# Patient Record
Sex: Female | Born: 1965 | Race: Black or African American | Hispanic: No | Marital: Single | Smoking: Former smoker
Health system: Southern US, Community
[De-identification: ages and names within clinical notes are randomized; demographics above are authoritative.]

## PROBLEM LIST (undated history)

## (undated) DIAGNOSIS — R591 Generalized enlarged lymph nodes: Secondary | ICD-10-CM

## (undated) DIAGNOSIS — D539 Nutritional anemia, unspecified: Secondary | ICD-10-CM

## (undated) DIAGNOSIS — B379 Candidiasis, unspecified: Secondary | ICD-10-CM

## (undated) DIAGNOSIS — R0602 Shortness of breath: Secondary | ICD-10-CM

## (undated) DIAGNOSIS — I5032 Chronic diastolic (congestive) heart failure: Secondary | ICD-10-CM

## (undated) DIAGNOSIS — I2510 Atherosclerotic heart disease of native coronary artery without angina pectoris: Secondary | ICD-10-CM

## (undated) DIAGNOSIS — Z720 Tobacco use: Secondary | ICD-10-CM

## (undated) DIAGNOSIS — I10 Essential (primary) hypertension: Secondary | ICD-10-CM

## (undated) DIAGNOSIS — R51 Headache: Secondary | ICD-10-CM

## (undated) DIAGNOSIS — I639 Cerebral infarction, unspecified: Secondary | ICD-10-CM

## (undated) DIAGNOSIS — R05 Cough: Secondary | ICD-10-CM

## (undated) DIAGNOSIS — F329 Major depressive disorder, single episode, unspecified: Secondary | ICD-10-CM

## (undated) DIAGNOSIS — D72819 Decreased white blood cell count, unspecified: Secondary | ICD-10-CM

## (undated) DIAGNOSIS — Z9289 Personal history of other medical treatment: Secondary | ICD-10-CM

## (undated) DIAGNOSIS — I272 Pulmonary hypertension, unspecified: Secondary | ICD-10-CM

## (undated) DIAGNOSIS — D61818 Other pancytopenia: Secondary | ICD-10-CM

## (undated) DIAGNOSIS — A539 Syphilis, unspecified: Secondary | ICD-10-CM

## (undated) DIAGNOSIS — E785 Hyperlipidemia, unspecified: Secondary | ICD-10-CM

## (undated) DIAGNOSIS — D638 Anemia in other chronic diseases classified elsewhere: Secondary | ICD-10-CM

## (undated) DIAGNOSIS — F99 Mental disorder, not otherwise specified: Secondary | ICD-10-CM

## (undated) DIAGNOSIS — E611 Iron deficiency: Secondary | ICD-10-CM

## (undated) DIAGNOSIS — I209 Angina pectoris, unspecified: Secondary | ICD-10-CM

## (undated) DIAGNOSIS — I071 Rheumatic tricuspid insufficiency: Secondary | ICD-10-CM

## (undated) DIAGNOSIS — F419 Anxiety disorder, unspecified: Secondary | ICD-10-CM

## (undated) DIAGNOSIS — K5900 Constipation, unspecified: Secondary | ICD-10-CM

## (undated) DIAGNOSIS — M310 Hypersensitivity angiitis: Secondary | ICD-10-CM

## (undated) DIAGNOSIS — F1910 Other psychoactive substance abuse, uncomplicated: Secondary | ICD-10-CM

## (undated) DIAGNOSIS — D0510 Intraductal carcinoma in situ of unspecified breast: Secondary | ICD-10-CM

## (undated) DIAGNOSIS — F32A Depression, unspecified: Secondary | ICD-10-CM

## (undated) HISTORY — DX: Other psychoactive substance abuse, uncomplicated: F19.10

## (undated) HISTORY — DX: Hypersensitivity angiitis: M31.0

## (undated) HISTORY — DX: Anemia in other chronic diseases classified elsewhere: D63.8

## (undated) HISTORY — DX: Cough: R05

## (undated) HISTORY — PX: ANKLE SURGERY: SHX546

## (undated) HISTORY — DX: Headache: R51

## (undated) HISTORY — DX: Other pancytopenia: D61.818

## (undated) HISTORY — DX: Iron deficiency: E61.1

## (undated) HISTORY — DX: Tobacco use: Z72.0

## (undated) HISTORY — DX: Nutritional anemia, unspecified: D53.9

## (undated) HISTORY — DX: Generalized enlarged lymph nodes: R59.1

## (undated) HISTORY — DX: Cerebral infarction, unspecified: I63.9

## (undated) HISTORY — DX: Decreased white blood cell count, unspecified: D72.819

## (undated) HISTORY — DX: Syphilis, unspecified: A53.9

## (undated) HISTORY — DX: Chronic diastolic (congestive) heart failure: I50.32

## (undated) HISTORY — DX: Shortness of breath: R06.02

## (undated) HISTORY — DX: Rheumatic tricuspid insufficiency: I07.1

## (undated) HISTORY — PX: BONE MARROW ASPIRATION: SHX1252

## (undated) HISTORY — DX: Atherosclerotic heart disease of native coronary artery without angina pectoris: I25.10

## (undated) HISTORY — DX: Candidiasis, unspecified: B37.9

## (undated) HISTORY — DX: Essential (primary) hypertension: I10

## (undated) HISTORY — DX: Personal history of other medical treatment: Z92.89

## (undated) HISTORY — PX: CARDIAC CATHETERIZATION: SHX172

## (undated) HISTORY — DX: Constipation, unspecified: K59.00

---

## 1999-11-20 ENCOUNTER — Emergency Department (HOSPITAL_COMMUNITY): Admission: EM | Admit: 1999-11-20 | Discharge: 1999-11-20 | Payer: Self-pay | Admitting: Emergency Medicine

## 1999-12-02 ENCOUNTER — Emergency Department (HOSPITAL_COMMUNITY): Admission: EM | Admit: 1999-12-02 | Discharge: 1999-12-02 | Payer: Self-pay | Admitting: Emergency Medicine

## 2005-05-13 ENCOUNTER — Ambulatory Visit: Payer: Self-pay | Admitting: *Deleted

## 2005-05-13 ENCOUNTER — Ambulatory Visit: Payer: Self-pay | Admitting: Nurse Practitioner

## 2005-05-15 ENCOUNTER — Ambulatory Visit: Payer: Self-pay | Admitting: Nurse Practitioner

## 2005-05-22 ENCOUNTER — Ambulatory Visit: Payer: Self-pay | Admitting: Nurse Practitioner

## 2005-05-29 ENCOUNTER — Ambulatory Visit: Payer: Self-pay | Admitting: Nurse Practitioner

## 2005-06-01 ENCOUNTER — Ambulatory Visit: Payer: Self-pay | Admitting: Nurse Practitioner

## 2007-10-18 ENCOUNTER — Ambulatory Visit: Payer: Self-pay | Admitting: Internal Medicine

## 2007-10-19 ENCOUNTER — Ambulatory Visit: Payer: Self-pay | Admitting: Surgery

## 2007-10-19 ENCOUNTER — Encounter (INDEPENDENT_AMBULATORY_CARE_PROVIDER_SITE_OTHER): Payer: Self-pay | Admitting: Internal Medicine

## 2007-10-19 ENCOUNTER — Inpatient Hospital Stay (HOSPITAL_COMMUNITY): Admission: EM | Admit: 2007-10-19 | Discharge: 2007-10-21 | Payer: Self-pay | Admitting: Emergency Medicine

## 2007-11-10 ENCOUNTER — Ambulatory Visit: Payer: Self-pay | Admitting: Family Medicine

## 2007-11-24 ENCOUNTER — Ambulatory Visit: Payer: Self-pay | Admitting: Internal Medicine

## 2007-12-21 ENCOUNTER — Ambulatory Visit: Payer: Self-pay | Admitting: Obstetrics & Gynecology

## 2008-01-19 ENCOUNTER — Other Ambulatory Visit: Admission: RE | Admit: 2008-01-19 | Discharge: 2008-01-19 | Payer: Self-pay | Admitting: Obstetrics and Gynecology

## 2008-01-19 ENCOUNTER — Ambulatory Visit: Payer: Self-pay | Admitting: Obstetrics and Gynecology

## 2008-01-19 ENCOUNTER — Encounter: Payer: Self-pay | Admitting: Obstetrics and Gynecology

## 2008-01-19 LAB — CONVERTED CEMR LAB
HCT: 37.4 % (ref 36.0–46.0)
Hemoglobin: 12.2 g/dL (ref 12.0–15.0)
Platelets: 238 10*3/uL (ref 150–400)
RBC: 4.51 M/uL (ref 3.87–5.11)
WBC: 4.6 10*3/uL (ref 4.0–10.5)

## 2008-01-27 ENCOUNTER — Ambulatory Visit: Payer: Self-pay | Admitting: Family Medicine

## 2008-02-02 ENCOUNTER — Ambulatory Visit: Payer: Self-pay | Admitting: Obstetrics & Gynecology

## 2008-02-07 ENCOUNTER — Ambulatory Visit: Payer: Self-pay | Admitting: Obstetrics & Gynecology

## 2008-10-03 ENCOUNTER — Ambulatory Visit: Payer: Self-pay | Admitting: Family Medicine

## 2008-10-23 ENCOUNTER — Ambulatory Visit: Payer: Self-pay | Admitting: Family Medicine

## 2008-10-23 ENCOUNTER — Encounter (INDEPENDENT_AMBULATORY_CARE_PROVIDER_SITE_OTHER): Payer: Self-pay | Admitting: Internal Medicine

## 2008-10-23 LAB — CONVERTED CEMR LAB
Albumin: 4.5 g/dL (ref 3.5–5.2)
Alkaline Phosphatase: 65 units/L (ref 39–117)
BUN: 13 mg/dL (ref 6–23)
Basophils Absolute: 0 10*3/uL (ref 0.0–0.1)
Calcium: 9.7 mg/dL (ref 8.4–10.5)
Eosinophils Relative: 2 % (ref 0–5)
Glucose, Bld: 76 mg/dL (ref 70–99)
HCT: 38.8 % (ref 36.0–46.0)
Hemoglobin: 12.9 g/dL (ref 12.0–15.0)
Lymphocytes Relative: 26 % (ref 12–46)
MCHC: 33.2 g/dL (ref 30.0–36.0)
MCV: 82.4 fL (ref 78.0–100.0)
Monocytes Absolute: 0.9 10*3/uL (ref 0.1–1.0)
Potassium: 3.9 meq/L (ref 3.5–5.3)
RDW: 14.3 % (ref 11.5–15.5)

## 2009-08-06 ENCOUNTER — Ambulatory Visit: Payer: Self-pay | Admitting: Internal Medicine

## 2009-08-06 ENCOUNTER — Encounter (INDEPENDENT_AMBULATORY_CARE_PROVIDER_SITE_OTHER): Payer: Self-pay | Admitting: Adult Health

## 2009-08-06 LAB — CONVERTED CEMR LAB
ALT: 10 units/L (ref 0–35)
CO2: 23 meq/L (ref 19–32)
Calcium: 8.7 mg/dL (ref 8.4–10.5)
Chloride: 103 meq/L (ref 96–112)
Cholesterol: 147 mg/dL (ref 0–200)
Lymphocytes Relative: 26 % (ref 12–46)
Lymphs Abs: 1.2 10*3/uL (ref 0.7–4.0)
MCV: 73.9 fL — ABNORMAL LOW (ref 78.0–100.0)
Microalb, Ur: 14.16 mg/dL — ABNORMAL HIGH (ref 0.00–1.89)
Monocytes Relative: 28 % — ABNORMAL HIGH (ref 3–12)
Neutro Abs: 2.1 10*3/uL (ref 1.7–7.7)
Neutrophils Relative %: 45 % (ref 43–77)
RBC: 3.99 M/uL (ref 3.87–5.11)
Sodium: 135 meq/L (ref 135–145)
TSH: 0.776 microintl units/mL (ref 0.350–4.500)
Total Protein: 7.3 g/dL (ref 6.0–8.3)
VLDL: 16 mg/dL (ref 0–40)
WBC: 4.6 10*3/uL (ref 4.0–10.5)

## 2009-08-07 ENCOUNTER — Ambulatory Visit (HOSPITAL_COMMUNITY): Admission: RE | Admit: 2009-08-07 | Discharge: 2009-08-07 | Payer: Self-pay | Admitting: Internal Medicine

## 2009-10-18 ENCOUNTER — Ambulatory Visit: Payer: Self-pay | Admitting: Family Medicine

## 2009-10-18 LAB — CONVERTED CEMR LAB
Anti Nuclear Antibody(ANA): NEGATIVE
BUN: 13 mg/dL (ref 6–23)
Basophils Relative: 0 % (ref 0–1)
Chloride: 102 meq/L (ref 96–112)
MCHC: 30.3 g/dL (ref 30.0–36.0)
Monocytes Relative: 31 % — ABNORMAL HIGH (ref 3–12)
Neutro Abs: 0.1 10*3/uL — ABNORMAL LOW (ref 1.7–7.7)
Neutrophils Relative %: 6 % — ABNORMAL LOW (ref 43–77)
Platelets: 327 10*3/uL (ref 150–400)
Potassium: 4.2 meq/L (ref 3.5–5.3)
RBC: 4.38 M/uL (ref 3.87–5.11)
Sodium: 136 meq/L (ref 135–145)
WBC: 2.3 10*3/uL — ABNORMAL LOW (ref 4.0–10.5)

## 2009-11-30 ENCOUNTER — Inpatient Hospital Stay (HOSPITAL_COMMUNITY): Admission: EM | Admit: 2009-11-30 | Discharge: 2009-12-05 | Payer: Self-pay | Admitting: Emergency Medicine

## 2009-11-30 ENCOUNTER — Ambulatory Visit: Payer: Self-pay | Admitting: Cardiology

## 2009-12-04 ENCOUNTER — Encounter (INDEPENDENT_AMBULATORY_CARE_PROVIDER_SITE_OTHER): Payer: Self-pay | Admitting: Internal Medicine

## 2010-04-13 ENCOUNTER — Encounter: Payer: Self-pay | Admitting: Internal Medicine

## 2010-04-14 ENCOUNTER — Encounter: Payer: Self-pay | Admitting: Internal Medicine

## 2010-06-05 LAB — CBC
HCT: 25.8 % — ABNORMAL LOW (ref 36.0–46.0)
HCT: 20.4 % — ABNORMAL LOW (ref 36.0–46.0)
HCT: 23.3 % — ABNORMAL LOW (ref 36.0–46.0)
HCT: 24.5 % — ABNORMAL LOW (ref 36.0–46.0)
HCT: 25.4 % — ABNORMAL LOW (ref 36.0–46.0)
Hemoglobin: 8.6 g/dL — ABNORMAL LOW (ref 12.0–15.0)
Hemoglobin: 8.2 g/dL — ABNORMAL LOW (ref 12.0–15.0)
Hemoglobin: 8.4 g/dL — ABNORMAL LOW (ref 12.0–15.0)
MCH: 23.1 pg — ABNORMAL LOW (ref 26.0–34.0)
MCH: 22.1 pg — ABNORMAL LOW (ref 26.0–34.0)
MCH: 22.1 pg — ABNORMAL LOW (ref 26.0–34.0)
MCH: 22.3 pg — ABNORMAL LOW (ref 26.0–34.0)
MCH: 23 pg — ABNORMAL LOW (ref 26.0–34.0)
MCHC: 33.1 g/dL (ref 30.0–36.0)
MCHC: 33.2 g/dL (ref 30.0–36.0)
MCHC: 33.3 g/dL (ref 30.0–36.0)
MCV: 69.7 fL — ABNORMAL LOW (ref 78.0–100.0)
MCV: 66.6 fL — ABNORMAL LOW (ref 78.0–100.0)
MCV: 69.6 fL — ABNORMAL LOW (ref 78.0–100.0)
Platelets: 205 10*3/uL (ref 150–400)
RBC: 3.7 MIL/uL — ABNORMAL LOW (ref 3.87–5.11)
RDW: 21.8 % — ABNORMAL HIGH (ref 11.5–15.5)
RDW: 22.2 % — ABNORMAL HIGH (ref 11.5–15.5)
RDW: 22.7 % — ABNORMAL HIGH (ref 11.5–15.5)
RDW: 23.4 % — ABNORMAL HIGH (ref 11.5–15.5)
WBC: 3 10*3/uL — ABNORMAL LOW (ref 4.0–10.5)

## 2010-06-05 LAB — URINALYSIS, ROUTINE W REFLEX MICROSCOPIC
Glucose, UA: NEGATIVE mg/dL
Nitrite: NEGATIVE
Specific Gravity, Urine: 1.008 (ref 1.005–1.030)
pH: 6.5 (ref 5.0–8.0)

## 2010-06-05 LAB — BASIC METABOLIC PANEL
BUN: 8 mg/dL (ref 6–23)
CO2: 24 mEq/L (ref 19–32)
CO2: 22 mEq/L (ref 19–32)
Chloride: 107 mEq/L (ref 96–112)
Chloride: 109 mEq/L (ref 96–112)
Creatinine, Ser: 0.99 mg/dL (ref 0.4–1.2)
GFR calc Af Amer: >60 mL/min (ref >60)
Glucose, Bld: 112 mg/dL — ABNORMAL HIGH (ref 70–99)
Potassium: 4 mEq/L (ref 3.5–5.1)
Potassium: 4.1 mEq/L (ref 3.5–5.1)
Sodium: 137 mEq/L (ref 135–145)

## 2010-06-05 LAB — COMPREHENSIVE METABOLIC PANEL
ALT: 10 U/L (ref 0–35)
ALT: 9 U/L (ref 0–35)
BUN: 7 mg/dL (ref 6–23)
CO2: 24 mEq/L (ref 19–32)
Calcium: 8.4 mg/dL (ref 8.4–10.5)
Calcium: 8.5 mg/dL (ref 8.4–10.5)
Creatinine, Ser: 0.99 mg/dL (ref 0.4–1.2)
Creatinine, Ser: 1.12 mg/dL (ref 0.4–1.2)
GFR calc Af Amer: >60 mL/min (ref >60)
GFR calc non Af Amer: 52 mL/min — ABNORMAL LOW (ref >60)
Glucose, Bld: 102 mg/dL — ABNORMAL HIGH (ref 70–99)
Glucose, Bld: 88 mg/dL (ref 70–99)
Sodium: 133 mEq/L — ABNORMAL LOW (ref 135–145)
Sodium: 134 mEq/L — ABNORMAL LOW (ref 135–145)
Total Protein: 7.5 g/dL (ref 6.0–8.3)
Total Protein: 8.2 g/dL (ref 6.0–8.3)

## 2010-06-05 LAB — FOLATE: Folate: 8.4 ng/mL

## 2010-06-05 LAB — TROPONIN I: Troponin I: 0.01 ng/mL (ref 0.00–0.06)

## 2010-06-05 LAB — GLUCOSE, CAPILLARY
Glucose-Capillary: 108 mg/dL — ABNORMAL HIGH (ref 70–99)
Glucose-Capillary: 112 mg/dL — ABNORMAL HIGH (ref 70–99)
Glucose-Capillary: 113 mg/dL — ABNORMAL HIGH (ref 70–99)
Glucose-Capillary: 135 mg/dL — ABNORMAL HIGH (ref 70–99)
Glucose-Capillary: 82 mg/dL (ref 70–99)
Glucose-Capillary: 86 mg/dL (ref 70–99)
Glucose-Capillary: 92 mg/dL (ref 70–99)
Glucose-Capillary: 92 mg/dL (ref 70–99)
Glucose-Capillary: 95 mg/dL (ref 70–99)
Glucose-Capillary: 99 mg/dL (ref 70–99)

## 2010-06-05 LAB — CROSSMATCH

## 2010-06-05 LAB — IRON AND TIBC
Saturation Ratios: 7 % — ABNORMAL LOW (ref 20–55)
TIBC: 251 ug/dL (ref 250–470)
UIBC: 233 ug/dL

## 2010-06-05 LAB — LIPASE, BLOOD: Lipase: 20 U/L (ref 11–59)

## 2010-06-05 LAB — CULTURE, BLOOD (ROUTINE X 2)

## 2010-06-05 LAB — LIPID PANEL
HDL: 16 mg/dL — ABNORMAL LOW (ref >39)
Triglycerides: 137 mg/dL (ref <150)
VLDL: 27 mg/dL (ref 0–40)

## 2010-06-05 LAB — SEDIMENTATION RATE: Sed Rate: 80 mm/hr — ABNORMAL HIGH (ref 0–22)

## 2010-06-05 LAB — URINE MICROSCOPIC-ADD ON

## 2010-06-05 LAB — FERRITIN: Ferritin: 187 ng/mL (ref 10–291)

## 2010-06-05 LAB — PROTIME-INR
INR: 1.14 (ref 0.00–1.49)
Prothrombin Time: 14.8 seconds (ref 11.6–15.2)

## 2010-06-05 LAB — DIFFERENTIAL
Basophils Absolute: 0 10*3/uL (ref 0.0–0.1)
Eosinophils Absolute: 0 10*3/uL (ref 0.0–0.7)
Lymphocytes Relative: 32 % (ref 12–46)
Monocytes Relative: 30 % — ABNORMAL HIGH (ref 3–12)
Neutrophils Relative %: 38 % — ABNORMAL LOW (ref 43–77)

## 2010-06-05 LAB — HIV ANTIBODY (ROUTINE TESTING W REFLEX): HIV: NONREACTIVE

## 2010-06-05 LAB — HEPATITIS PANEL, ACUTE
Hep A IgM: NEGATIVE
Hep B C IgM: NEGATIVE
Hepatitis B Surface Ag: NEGATIVE

## 2010-06-05 LAB — ABO/RH: ABO/RH(D): B POS

## 2010-06-05 LAB — RETICULOCYTES: Retic Ct Pct: 1.2 % (ref 0.4–3.1)

## 2010-06-05 LAB — HEMOCCULT GUIAC POC 1CARD (OFFICE): Fecal Occult Bld: NEGATIVE

## 2010-06-05 LAB — MAGNESIUM: Magnesium: 2.1 mg/dL (ref 1.5–2.5)

## 2010-08-05 NOTE — H&P (Signed)
NAMETANEAL, SONNTAG                ACCOUNT NO.:  [id number]   MEDICAL RECORD NO.:  [id number]          PATIENT TYPE:  INP   LOCATION:  3035                         FACILITY:  [facility]   PHYSICIAN:  Lonia Blood, M.D.      DATE OF BIRTH:  [birth year]   DATE OF ADMISSION:  10/18/2007  DATE OF DISCHARGE:                              HISTORY & PHYSICAL   PRIMARY CARE PHYSICIAN:  The patient is unassigned to [place].   PRESENTING COMPLAINT:  Right hand weakness and clumsiness.   HISTORY OF PRESENT ILLNESS:  The patient is a 45 year old female with  severe noncompliance, polysubstance abuse, especially cocaine use.  The  patient reported having had right-sided weakness and slurred speech over  2-3 days prior to coming in.  She had used cocaine extensively the day  before the onset of her symptoms.  Since then, she has been steadily  improving.  She has recovered most of her function, including her gait,  except still having poor writing skills.  Denied any other focal  weakness.  She has been on medications before, but has not taken them  effectively.   PAST MEDICAL HISTORY:  Mainly hypertension.   ALLERGIES:  NO KNOWN DRUG ALLERGIES.   MEDICATIONS:  Does not take any medicine at all.   SOCIAL HISTORY:  The patient lives literally alone.  Very high risk  noncompliant patient.  Smokes about a pack per day.  Heavy drinker, uses  crack cocaine.   FAMILY HISTORY:  Significant for coronary artery disease, diabetes and  hypertension.   REVIEW OF SYSTEMS:  Twelve point review of systems is mainly per HPI.   PHYSICAL EXAMINATION:  VITAL SIGNS:  Temperature 97.8, blood pressure  230/143, pulse of 89, respiratory 18 and sats 100% on room air.  GENERAL:  She is awake, alert, oriented, communicating well in no acute  distress.  HEENT:  PERRL.  EOMI.  NECK:  Supple.  No JVD, no lymphadenopathy.  RESPIRATORY:  Good air entry bilaterally.  No wheezes or rales.  CARDIOVASCULAR:  The patient has S1-S2,  no murmurs.  ABDOMEN:  Soft, nontender with positive bowel sounds.  EXTREMITIES:  No edema, cyanosis or clubbing.  NEURO:  Essentially normal with normal cranial nerves II-XII.  Power 5/5  upper and lower extremities respectively.  Reflexes 2+ upper and lower  extremities respectively.  Good gait.  The patient has mild a problem  with being legible, but is able to write sentences effectively.   LABORATORY DATA:  Sodium 139, potassium 3.8, chloride 111, BUN 13,  creatinine 1.2, glucose 92 and calcium 10.  Cardiac enzymes essentially  normal.  PT 13.9, INR 1.0.  Albumin 3.2.  Urinalysis showed moderate  leukocyte esterase with cloudy urine and large hemoglobin.  WBCs 11-20,  RBCs 11-20 and few bacteria.  Also, the patient had Trichomonas.  Urine  drug screen is positive for cocaine.  Her white count is 4.5, hemoglobin  9.4 and platelet count 250.  Head CT without contrast showed vague  patchy hypodensity in the left frontal paraventricular white matter,  probably due to microvascular ischemic  change.  No hydrocephalus or  midline shift.  MRI of the brain showed acute restricted left pons and  inferior mid brain but no mass effect or bleed seen.  Very advanced  cerebral white matter disease for age.  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   ASSESSMENT:  This is a 45 year old female with use of crack cocaine and  abuse of other substances presenting with subacute stroke,  trichomoniasis, severe hypertension and urinary tract infection.   PLAN:  1. Subacute cerebrovascular accident.  Will admit the patient and do a      full CVA workup, including carotid Dopplers, 2-D echo, checking      B12, RPR.  Homocysteine level.  Although this is quite consistent      with the fact that she uses cocaine and most likely that is the       cause.  Will also get neurology consult and excessive counseling.  2. Hypertension.  Will avoid beta-blockers, but use clonidine and ACE      inhibitors probably to control her blood pressure as much as      possible.  3. Cocaine abuse.  The patient will be counseled.  We may have to get      psych consult prior to her discharge due to the polysubstance      abuse.  4. Trichomoniases.  I will give the patient 2 grams of Flagyl now.      The patient's partner or partners may need to be treated once she      is out of the hospital.  Will need to consider STD screening and      offer it to the patient prior to discharge.  5. UTI.  I will put the patient on 3 days of antibiotics for UTI.      Further treatment will depend on how the patient does, and      hopefully she will be followed with neurology at the end of her      visit.      Lonia Blood, M.D.  Electronically Signed     LG/MEDQ  D:  10/19/2007  T:  10/19/2007  Job:  [id number]

## 2010-08-05 NOTE — Group Therapy Note (Signed)
[name], Bean                ACCOUNT NO.:  [id number]   MEDICAL RECORD NO.:  [id number]         PATIENT TYPE:  AWOC   LOCATION:  [facility]                     FACILITY:   PHYSICIAN:  Johnella Moloney, MD        DATE OF BIRTH:  [birth year]   DATE OF SERVICE:  01/30/2008                                  CLINIC NOTE   Morgan Bean   The patient is a 45 year old gravida 3, para 2-0-1-2 who was evaluated  initially on December 21, 2007, for large uterine fibroids,  menorrhagia, and anemia.  The patient said that she was very interested  in surgical management on a history form that the patient filled out.  She mentioned her only medical problem was hypertension and at that  point, she was getting that controlled on multiple medications.  Upon  further evaluation of her medical history, it is noted that the patient  had a CVA in October 21, 2007, and she was admitted for this.  This was not  mentioned by the patient during her initial encounter and in addition to  this, the patient is having elevated blood pressures in the 170s/120s  range and she says that has been acutely followed by her [facility]  doctor and yet another medication has been added to her regimen to  control her blood pressure.  Given the new information about her CVA and  also labile blood pressures and suboptimal control of her blood  pressure, the patient was told that she is not a good surgical candidate  at this point.  Her surgery was initially scheduled to be on January 31, 2008, and it is now canceled.  Pending further evaluation by her  primary care physician and also medical clearance.  In the meantime, the  patient is going to fill out an application to see if she could get Depo-  Lupron, which will help in shrinking the fibroid and might give some  relief also with her bleeding symptoms.  The patient is agreeable to  this plan.  She will come in with her mother who she currently lives  with regarding  filling out information to be able to complete the  application for the Lupron and get this medication as soon as possible.  The patient did promise that she will call back later today for a time  of appointment when she can come with her mother to give this financial  information.  It was stressed to the patient that is very important with  her encounter and other physicians to make them aware of serious medical  problems such as CVA in the past.  The patient did verbalize  understanding of my concern and also operative concern, given what is  going on with her medical issues.  Her surgery is now cancelled and will  be rescheduled at a later date if needed at which point, she would get  medical clearance before this is done.           ______________________________  Johnella Moloney, MD     UD/MEDQ  D:  01/30/2008  T:  01/31/2008  Job:  [id number]

## 2010-08-05 NOTE — Group Therapy Note (Signed)
NAMEMEILA, BERKE NO.:  [id number]   MEDICAL RECORD NO.:  [id number]          PATIENT TYPE:  WOC   LOCATION:  [facility]                   FACILITY:  [facility]   PHYSICIAN:  Argentina Donovan, MD        DATE OF BIRTH:  06/13/1965   DATE OF SERVICE:  01/19/2008                                  CLINIC NOTE   The patient is a 45 year old gravida 3, para 2-0-1-2, history of two  vaginal deliveries, recently hospitalized with a stroke.  She came in  and saw Dr. Silas Flood on January 10, 2008, because of anemia, heavy periods,  and enlarged fibroid uterus.  At that point, she had a blood pressure of  178/127 that was rechecked and was unchanged, and did not take her blood  pressure medications in 4 days.  Today, she has taken the med regularly  and her blood pressure today is 127/90.  She seems to have been left  with no sequelae from the CVA she had and is anxious to have a  hysterectomy.   PHYSICAL EXAMINATION:  The external genitalia is normal.  BUS within  normal limits.  Vagina is clean and well rugated.  Cervix is clean,  parous and dextroverted to the side, and the uterus is somewhat  irregular in configuration, seems to be about 10-12 weeks size, but very  mobile.  Adnexa could not be well palpated.   Ultrasound showed a uterus that was 10 x 3.5 x 4.5 in July 2009 with the  poster fibroid measuring 9.3 x 7.5 x 6.3.  Pap smear was taken and  endometrial biopsy was done.  The uterus was sounded to a depth of 11  cm.  My feeling is that probably this might be able to be done vaginally  if we can shrink it down with Depo-Lupron, so we are going to apply for  Depo-Lupron scholarship dose of 11.25 mg.  We will have her come back in  2 weeks for her results of her endometrial biopsy and Pap smear, and at  that time make a decision on the surgery with Dr. Silas Flood who has  previously seen her and we also drew a CBC today as the patient is  taking iron.   MEDICATIONS:  Clonidine,  hydrochlorothiazide, multivitamins, ferrous  sulfate, baby aspirin, and ibuprofen p.r.n.           ______________________________  Argentina Donovan, MD     PR/MEDQ  D:  01/19/2008  T:  01/20/2008  Job:  [id number]
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
## 2010-08-05 NOTE — Discharge Summary (Signed)
[name], Morgan Bean                ACCOUNT NO.:  [id number]   MEDICAL RECORD NO.:  [id number]          PATIENT TYPE:  INP   LOCATION:  3035                         FACILITY:  [facility]   PHYSICIAN:  Elliot Cousin, M.D.    DATE OF BIRTH:  1965-12-13   DATE OF ADMISSION:  10/18/2007  DATE OF DISCHARGE:  10/21/2007                               DISCHARGE SUMMARY   DISCHARGE DIAGNOSES:  1. 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  2. Left upper extremity paresis and dysarthria secondary to the acute      stroke.  3. Malignant hypertension.  4. Iron deficiency anemia.  5. Moderate left ventricular hypertrophy per 2-D echocardiogram.  6. Cocaine, alcohol, and tobacco abuse.  7. Urinary tract infection.  8. Syphilis.  9. Trichomonas infection.  10.Large uterine fibroids.  11.Cipro allergy.   DISCHARGE MEDICATIONS:  1. Hydrochlorothiazide 25 mg daily.  2. Potassium chloride 20 mEq daily (take with hydrochlorothiazide).  3. Clonidine 0.2 mg 1 tablet daily.  4. Ferrous sulfate 325 mg b.i.d.  5. Multivitamin once daily.  6. Aspirin 81 mg daily.  7. Penicillin G IM, 2.4 million units x2 more injections over the next      2 weeks at the [facility].   DISCHARGE DISPOSITION:  The patient is being discharged home in improved  and stable condition.  She was advised to follow up at the [facility] in 3-5 days.   CONSULTATIONS:  None.   PROCEDURES PERFORMED:  1. Pelvic and transvaginal ultrasound on October 20, 2007.  The results      revealed large anterior and posterior uterine fibroids.      Nonvisualized ovaries.  2. MRA of the head on October 18, 2007.  The results revealed widespread      intracranial arthrosclerosis is advanced for age as detailed above.      Mild to moderate stenosis of the left MCA origin, right MCA      trifurcation, and left ICA.  No major branch occlusion.  3. MRI of the brain on October 18, 2007.  The results revealed  focus of      restricted diffusion and edema in the left pons and inferior mid      brain without mass effect or hemorrhage.  Superimposed very      advanced cerebral white matter disease for age.  4. Two-D echocardiogram performed on October 19, 2007.  The results      revealed overall left ventricular systolic function was normal.      Ejection fraction estimated to be 65%.  No left ventricular      regional wall motion abnormalities.  Left ventricular wall      thickness was moderately increased.  Moderate tricuspid valvular      regurgitation.  Left atrium was moderately dilated.  Right atrium      was moderately dilated.  5. Carotid Doppler study performed but no results available in the      chart.  6. CT scan of the head without contrast on October 18, 2007.  The results      revealed patchy hypodensity in the left frontal periventricular      white matter.  Negative for hemorrhage, hydrocephalus, or midline      shift.   HISTORY OF PRESENT ILLNESS:  The patient is a 45 year old woman with a  past medical history significant for polysubstance abuse, who presented  to the emergency department on October 18, 2007, with a chief complaint of  right-sided weakness and slurred speech.  When the patient was evaluated  in the emergency department, her blood pressure was noted to be 230/143.  The CT scan of her head revealed a patchy hypodensity in the left  frontal periventricular white matter.  The patient was admitted for  further evaluation and management.   For additional details, please see the dictated history and physical.   HOSPITAL COURSE:  1. ACUTE LEFT BRAIN STROKE, WIDESPREAD CEREBROVASCULAR DISEASE, RIGHT      UPPER EXTREMITY WEAKNESS AND DYSARTHRIA.  The patient was started      on treatment with both  clonidine and aspirin.  For further      evaluation, an MRI of the brain, MRA of the head, carotid Dopplers,      and a 2-D echocardiogram were ordered.  The MRI of the brain       revealed findings suggestive of an acute left brain stroke.  The      MRA of the head revealed diffuse widespread cerebrovascular      disease.  The 2-D echocardiogram revealed LVH and an ejection      fraction of 65%.  The carotid Doppler study was ordered;  however,      the results are currently not available.  The physical and      occupational therapists were consulted. Per their assessments,  the      patient did not require further outpatient therapies.  The      occupational therapist did provide the patient with exercises to      improve the strengthening and functionality of her right hand which      is her dominant hand.  The speech therapist was also consulted.      There was no evidence of dysphagia per her evaluation; however,      there was evidence of dysarthria.  The speech therapist provided      the patient with strategies to improve her phonation.  There was      also no evidence of expressive or receptive aphasia.  Over the      course of the hospitalization, the patient's right hand became less      clumsy, however, she still has significant disability with regards      to the use of her right hand.  She still has dysarthria; however,      it appears to be less than when she initially presented.  The      patient may benefit from short term disability until she fully      recovers from her stroke.  2. HYPERTENSIVE URGENCY/MALIGNANT HYPERTENSION.  The patient was      started on clonidine and subsequently Norvasc and      hydrochlorothiazide.  Over the course of the hospitalization, her      blood pressure normalized.  Upon discharge, the Norvasc was      discontinued and the clonidine and the hydrochlorothiazide were      continued.  3. IRON DEFICIENCY ANEMIA WITH EVIDENCE OF UTERINE FIBROIDS AND  DYSFUNCTIONAL UTERINE BLEEDING.  The patient's hemoglobin was 9.4      and her MCV was 68.9 at the time of the initial hospital      assessment.  With IV fluid volume  repletion, her hemoglobin fell to      a nadir of 8.2.  Iron studies were ordered and revealed a vitamin      B12 of 321, total iron of 18, TIBC of 370, ferritin of 17, and RBC      folate of 811.  Her TSH was also assessed and was within normal      limits at 3.3.  When questioned about rectal bleeding, the patient      had no complaints or history of bright red blood per rectum or      melena.  However, she readily admitted that her menstrual periods      are generally heavy lasting 10-15 days.  Given this revelation, a      pelvic ultrasound was ordered and it revealed large uterine      fibroids.  More than likely, dysfunctional uterine bleeding is the      cause of the patient's microcytic anemia.  The patient was advised      to follow up with her primary care physician Dr. Emeline Darling at the      [facility] who will hopefully refer her to a gynecologist      for further evaluation and management.  The patient was started on      ferrous sulfate and a multivitamin daily.  4. COCAINE, ALCOHOL, AND TOBACCO ABUSE.  The patient's history is      positive for crack cocaine use, tobacco use, and alcohol use.  The      patient was advised and counseled about stopping.  She was also      informed that the cocaine contributed to the malignant hypertension      and her stroke.  She voiced understanding.  The social worker did      provide the patient with services available to help with substance      abuse; however, the patient refused these services.  The dictating      physician strongly advised the patient to seek help at narcotics      anonymous and alcoholic anonymous as needed.  She was receptive;      however, she felt that she could stop using alcohol and drugs on      her own.  She stated that she will probably need more help with      stopping smoking.  The patient was treated with vitamin therapy and      a nicotine patch during the hospitalization.  She demonstrated no       signs or symptoms consistent with a withdrawal syndrome.  5. URINARY TRACT INFECTION, SYPHILIS, TRICHOMONAS INFECTION.  The      patient's urinalysis was positive for moderate leukocytes.  She was      started on ciprofloxacin initially; however, she did develop an      itchy rash following the ciprofloxacin.  The Cipro was discontinued      and the patient was started on Rocephin.  She was treated      completely during the hospitalization and now she has no complaints      of painful urination.  The patient was also screened for syphilis      with an RPR.  The RPR was reactive.  The RPR  titer was 1:4.      Subsequently, T pallidum antibody was ordered and it was grossly      positive at greater than 8.0.  The patient stated that she had been      treated for syphilis in the past.  However, given the elevated      pallidum antibody, she was restarted on penicillin therapy.  She      received one injection during the hospitalization and was advised      to follow up with her primary care physician at the [facility] for two additional doses.  The patient's urinalysis was also      positive for  Trichomonas.  She was treated with 2 g of Flagyl      orally.  The patient was strongly advised to use protection when      having sexual intercourse.  She voiced understanding.  Because of      the positive STD screen, an HIV antibody was ordered.  The HIV      antibody was negative/nonreactive.  6. ALLERGIC REACTION SECONDARY TO CIPRO.  The patient developed an      itchy, transient rash as the ciprofloxacin was being infused via      the IV.  The ciprofloxacin was discontinued and the patient's mild      allergic reaction dissipated.      Elliot Cousin, M.D.  Electronically Signed     DF/MEDQ  D:  10/21/2007  T:  10/22/2007  Job:  [id number]   cc:   [facility]

## 2010-12-09 ENCOUNTER — Other Ambulatory Visit: Payer: Self-pay | Admitting: Family Medicine

## 2010-12-09 ENCOUNTER — Other Ambulatory Visit (HOSPITAL_COMMUNITY): Payer: Self-pay | Admitting: Family Medicine

## 2010-12-09 DIAGNOSIS — Z1231 Encounter for screening mammogram for malignant neoplasm of breast: Secondary | ICD-10-CM

## 2010-12-12 ENCOUNTER — Encounter: Payer: Self-pay | Admitting: Cardiology

## 2010-12-15 ENCOUNTER — Ambulatory Visit (INDEPENDENT_AMBULATORY_CARE_PROVIDER_SITE_OTHER): Payer: Self-pay | Admitting: Cardiology

## 2010-12-15 ENCOUNTER — Encounter: Payer: Self-pay | Admitting: Cardiology

## 2010-12-15 DIAGNOSIS — I10 Essential (primary) hypertension: Secondary | ICD-10-CM | POA: Insufficient documentation

## 2010-12-15 DIAGNOSIS — F17200 Nicotine dependence, unspecified, uncomplicated: Secondary | ICD-10-CM

## 2010-12-15 DIAGNOSIS — Z79899 Other long term (current) drug therapy: Secondary | ICD-10-CM

## 2010-12-15 DIAGNOSIS — R0609 Other forms of dyspnea: Secondary | ICD-10-CM | POA: Insufficient documentation

## 2010-12-15 DIAGNOSIS — E785 Hyperlipidemia, unspecified: Secondary | ICD-10-CM

## 2010-12-15 DIAGNOSIS — R079 Chest pain, unspecified: Secondary | ICD-10-CM | POA: Insufficient documentation

## 2010-12-15 LAB — BASIC METABOLIC PANEL
BUN: 20 mg/dL (ref 6–23)
CO2: 25 mEq/L (ref 19–32)
Chloride: 102 mEq/L (ref 96–112)
Glucose, Bld: 79 mg/dL (ref 70–99)
Potassium: 4 mEq/L (ref 3.5–5.1)

## 2010-12-15 LAB — HEPATIC FUNCTION PANEL
ALT: 10 U/L (ref 0–35)
AST: 15 U/L (ref 0–37)
Albumin: 3.3 g/dL — ABNORMAL LOW (ref 3.5–5.2)
Alkaline Phosphatase: 68 U/L (ref 39–117)

## 2010-12-15 MED ORDER — LISINOPRIL 20 MG PO TABS: 20.0000 mg | ORAL_TABLET | Freq: Every day | ORAL | Status: DC

## 2010-12-15 NOTE — Patient Instructions (Signed)
Your physician recommends that you schedule a follow-up appointment in:  AS NEEDED Your physician recommends that you continue on your current medications as directed. Please refer to the Current Medication list given to you today.  Your physician recommends that you return for lab work in: TODAY BMET BNP  LIPID LIVER   DX  401.9 786.09 V58.69  Your physician has requested that you have an echocardiogram. Echocardiography is a painless test that uses sound waves to create images of your heart. It provides your doctor with information about the size and shape of your heart and how well your heart's chambers and valves are working. This procedure takes approximately one hour. There are no restrictions for this procedure. DX DYSPNEA Your physician has requested that you have en exercise stress myoview. For further information please visit [email or web address] Please follow instruction sheet, as given. DX CHEST PAIN

## 2010-12-15 NOTE — Assessment & Plan Note (Signed)
Active smoker.  She has started Chantix and is trying to quit.

## 2010-12-15 NOTE — Assessment & Plan Note (Signed)
BP is high despite taking medications today.  I will have her increase lisinopril to 20 mg daily.  Will need BMET in 2 wks at return appointment.  When I see her back, I will titrate her meds depending on blood pressure at that time.

## 2010-12-15 NOTE — Assessment & Plan Note (Signed)
Patient has constant low level chest pressure.  It dose get worse with exertion.  She also has quite significant exertional dyspnea.  Risk factors for CAD include premature family history of CAD (brother at 74), smoking, hypertension.  I will get an ETT-myoview for risk stratification.  I will check lipids.  She should continue ASA 81 mg daily.

## 2010-12-15 NOTE — Progress Notes (Signed)
PCP: Georganna Skeans, [facility]  45 yo with history of HTN and abnormal echo in 9/11 with moderate pulmonary hypertension and moderate to possibly severe tricuspid regurgitation presents for cardiology evaluation.  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  She had an echo done at that time with the aforementioned findings.  Patient has had poorly controlled hypertension.  This seems to be in part due to noncompliance, but her BP today is 154/104 and she says that she took all her meds.  She has had constant chest aching for over a year.  This gets worse with exertion.  She was apparently told 3 years ago that she had had an MI, but she does not think she has had a stress test and she has not had a heart catheterization in our system.  She has also had quite significant exertional dyspnea for over a year.  She has a very hard time walking up steps.  She cannot walk more than a block without stopping.  She has occasional episodes that could be consistent with PND.  She continues to smoke but is interested in quitting.    ECG: NSR, nonspecific T wave flattening.   Labs (9/12): HCT 31.5   PMH: 1. Fe-deficiency anemia 2. HTN 3. Asthma 4. Active smoker 5. Prior cocaine 6. 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  ANA positive.  Thought to be secondary to cocaine use.   8. Pulmonary hypertension/tricuspid regurgitation: Echo (9/11) with EF 65%, mild LVH, mild AI, mild MR, moderate to possibly severe TR with PA systolic pressure 52 mmHg.    SH: Active smoker (1 ppd > 20 years), has 1 daughter, unemployed.  Prior cocaine abuse.    FH: Grandmother with "heart problems."  Father with 2 open heart surgeries in his late 70s.  Brother with PCI at age 11.   ROS: All systems reviewed and negative except as per HPI.   Current Outpatient Prescriptions  Medication Sig Dispense Refill    . Albuterol Sulfate (PROVENTIL HFA IN) Inhale into the lungs. 2 puffs every 4 hours as needed       . aspirin 81 MG tablet Take 81 mg by mouth daily.        . calcium carbonate (OS-CAL) 600 MG TABS Take 600 mg by mouth. 1 tab bid      . cloNIDine (CATAPRES) 0.2 MG tablet Take 0.2 mg by mouth 2 (two) times daily.        [name] docusate sodium (COLACE) 100 MG capsule Take 100 mg by mouth 2 (two) times daily.        . hydrochlorothiazide (HYDRODIURIL) 25 MG tablet Take 25 mg by mouth daily.        [name] lisinopril (PRINIVIL,ZESTRIL) 20 MG tablet Take 1 tablet (20 mg total) by mouth daily.  30 tablet  11  . loratadine (CLARITIN) 10 MG tablet Take 10 mg by mouth daily.        . Magnesium Oxide -Mg Supplement 400 MG CAPS Take by mouth. 1 tab daily       . NON FORMULARY ferosul 325 mg  Take 1 tab bid       . varenicline (CHANTIX) 1 MG tablet As directed       . Vitamin D, Ergocalciferol, (DRISDOL) 50000 UNITS CAPS Take 50,000 Units by mouth.          BP 154/104  Pulse 69  Ht 5\' 6"  (1.676 m)  Wt 164 lb (74.39 kg)  BMI 26.47 kg/m2 General: NAD Neck: JVP 7 cm, no thyromegaly or thyroid nodule.  Lungs: Clear to auscultation bilaterally with normal respiratory effort. CV: Nondisplaced PMI.  Heart regular S1/S2, no S3/S4, 1/6 LLSB systolic murmur.  No peripheral edema.  No carotid bruit.  Normal pedal pulses.  Abdomen: Soft, nontender, no hepatosplenomegaly, no distention.  Skin: Intact without lesions or rashes.  Neurologic: Alert and oriented x 3.  Psych: Normal affect. Extremities: No clubbing or cyanosis.  HEENT: Normal.

## 2010-12-15 NOTE — Assessment & Plan Note (Signed)
Patient has quite prominent exertional dyspnea.  She is not particularly volume overloaded on exam.  There was a concern for moderate to possibly severe TR on last echo.  Her TR murmur is not very prominent today.  There was moderate pulmonary hypertension by the last echo as well.  I would certainly be concerned that pulmonary arterial hypertension could be a cause of her symptoms.  However, the pulmonary hypertension could certainly be secondary to poorly controlled blood pressure with diastolic dysfunction and elevated left atrial pressure.   - Check BNP - Echocardiogram to assess diastolic function, pulmonary artery pressure, and tricuspid regurgitation.  - If there is still significant pulmonary hypertension, would consider right heart cath.

## 2010-12-16 LAB — BRAIN NATRIURETIC PEPTIDE: Pro B Natriuretic peptide (BNP): 132 pg/mL — ABNORMAL HIGH (ref 0.0–100.0)

## 2010-12-18 ENCOUNTER — Ambulatory Visit (HOSPITAL_COMMUNITY): Payer: Self-pay

## 2010-12-18 ENCOUNTER — Ambulatory Visit (HOSPITAL_COMMUNITY)
Admission: RE | Admit: 2010-12-18 | Discharge: 2010-12-18 | Disposition: A | Discharge status: Home / Self Care | Payer: Self-pay | Source: Ambulatory Visit | Attending: Family Medicine | Admitting: Family Medicine

## 2010-12-18 DIAGNOSIS — Z1231 Encounter for screening mammogram for malignant neoplasm of breast: Secondary | ICD-10-CM | POA: Insufficient documentation

## 2010-12-19 LAB — BASIC METABOLIC PANEL
CO2: 24
Chloride: 109
GFR calc Af Amer: >60
Glucose, Bld: 89
Potassium: 3.5
Sodium: 137

## 2010-12-19 LAB — RAPID URINE DRUG SCREEN, HOSP PERFORMED
Amphetamines: NOT DETECTED
Benzodiazepines: NOT DETECTED
Cocaine: POSITIVE — AB

## 2010-12-19 LAB — CK TOTAL AND CKMB (NOT AT ARMC)
CK, MB: 1.2
Total CK: 69

## 2010-12-19 LAB — CBC
HCT: 25.4 — ABNORMAL LOW
HCT: 29.8 — ABNORMAL LOW
Hemoglobin: 8.2 — ABNORMAL LOW
MCHC: 31.5
MCHC: 32.3
MCV: 68.5 — ABNORMAL LOW
MCV: 69.9 — ABNORMAL LOW
Platelets: 228
Platelets: 250
RBC: 3.71 — ABNORMAL LOW
RDW: 20.2 — ABNORMAL HIGH
RDW: 20.7 — ABNORMAL HIGH
WBC: 3.8 — ABNORMAL LOW

## 2010-12-19 LAB — COMPREHENSIVE METABOLIC PANEL
ALT: 22
AST: 26
Albumin: 3 — ABNORMAL LOW
BUN: 12
CO2: 22
Calcium: 8.7
Calcium: 8.9
Chloride: 102
Chloride: 112
Creatinine, Ser: 1.03
Creatinine, Ser: 1.24 — ABNORMAL HIGH
GFR calc Af Amer: 57 — ABNORMAL LOW
GFR calc non Af Amer: 58 — ABNORMAL LOW
Sodium: 136
Total Bilirubin: 0.3

## 2010-12-19 LAB — URINALYSIS, ROUTINE W REFLEX MICROSCOPIC
Nitrite: NEGATIVE
Protein, ur: NEGATIVE
Specific Gravity, Urine: 1.008
Urobilinogen, UA: 0.2

## 2010-12-19 LAB — POCT CARDIAC MARKERS
CKMB, poc: 1.2
Myoglobin, poc: 39
Myoglobin, poc: 44.5
Troponin i, poc: <0.05
Troponin i, poc: <0.05

## 2010-12-19 LAB — POCT I-STAT, CHEM 8
HCT: 29 — ABNORMAL LOW
Hemoglobin: 9.9 — ABNORMAL LOW
Potassium: 3.8
Sodium: 139
TCO2: 19

## 2010-12-19 LAB — DIFFERENTIAL
Basophils Absolute: 0.2 — ABNORMAL HIGH
Eosinophils Absolute: 0
Lymphs Abs: 1.8
Monocytes Absolute: 0.8
Monocytes Relative: 18 — ABNORMAL HIGH

## 2010-12-19 LAB — CARDIAC PANEL(CRET KIN+CKTOT+MB+TROPI)
CK, MB: 1
Relative Index: INVALID
Relative Index: INVALID
Total CK: 52
Total CK: 58
Troponin I: 0.01
Troponin I: 0.01

## 2010-12-19 LAB — PROTIME-INR: INR: 1

## 2010-12-19 LAB — B-NATRIURETIC PEPTIDE (CONVERTED LAB): Pro B Natriuretic peptide (BNP): 345 — ABNORMAL HIGH

## 2010-12-19 LAB — URINE MICROSCOPIC-ADD ON

## 2010-12-19 LAB — RPR: RPR Ser Ql: REACTIVE — AB

## 2010-12-19 LAB — VITAMIN B12: Vitamin B-12: 428 (ref 211–911)

## 2010-12-19 LAB — LIPID PANEL
Cholesterol: 144
HDL: 30 — ABNORMAL LOW
Total CHOL/HDL Ratio: 4.8

## 2010-12-19 LAB — HEMOGLOBIN A1C: Hgb A1c MFr Bld: 5.4

## 2010-12-19 LAB — CULTURE, BLOOD (ROUTINE X 2): Culture: NO GROWTH

## 2010-12-19 LAB — POCT PREGNANCY, URINE: Preg Test, Ur: NEGATIVE

## 2010-12-19 LAB — IRON AND TIBC: UIBC: 352

## 2010-12-19 LAB — HOMOCYSTEINE: Homocysteine: 10.7

## 2010-12-19 LAB — CALCIUM: Calcium: 8.5

## 2010-12-24 ENCOUNTER — Other Ambulatory Visit (HOSPITAL_COMMUNITY): Payer: Self-pay

## 2010-12-25 ENCOUNTER — Ambulatory Visit (HOSPITAL_COMMUNITY): Payer: Self-pay | Attending: Cardiology | Admitting: Radiology

## 2010-12-25 ENCOUNTER — Ambulatory Visit (HOSPITAL_BASED_OUTPATIENT_CLINIC_OR_DEPARTMENT_OTHER): Payer: Self-pay

## 2010-12-25 ENCOUNTER — Other Ambulatory Visit (HOSPITAL_COMMUNITY): Payer: Self-pay | Admitting: Cardiology

## 2010-12-25 ENCOUNTER — Other Ambulatory Visit: Payer: Self-pay | Admitting: Cardiology

## 2010-12-25 ENCOUNTER — Other Ambulatory Visit (INDEPENDENT_AMBULATORY_CARE_PROVIDER_SITE_OTHER): Payer: Self-pay | Admitting: *Deleted

## 2010-12-25 VITALS — Ht 66.0 in | Wt 159.0 lb

## 2010-12-25 DIAGNOSIS — E785 Hyperlipidemia, unspecified: Secondary | ICD-10-CM

## 2010-12-25 DIAGNOSIS — R0609 Other forms of dyspnea: Secondary | ICD-10-CM

## 2010-12-25 DIAGNOSIS — I071 Rheumatic tricuspid insufficiency: Secondary | ICD-10-CM

## 2010-12-25 DIAGNOSIS — R079 Chest pain, unspecified: Secondary | ICD-10-CM | POA: Insufficient documentation

## 2010-12-25 DIAGNOSIS — R0989 Other specified symptoms and signs involving the circulatory and respiratory systems: Secondary | ICD-10-CM

## 2010-12-25 DIAGNOSIS — I079 Rheumatic tricuspid valve disease, unspecified: Secondary | ICD-10-CM

## 2010-12-25 DIAGNOSIS — R0789 Other chest pain: Secondary | ICD-10-CM

## 2010-12-25 DIAGNOSIS — I369 Nonrheumatic tricuspid valve disorder, unspecified: Secondary | ICD-10-CM

## 2010-12-25 LAB — LIPID PANEL
Cholesterol: 202 mg/dL — ABNORMAL HIGH (ref 0–200)
Total CHOL/HDL Ratio: 4
Triglycerides: 77 mg/dL (ref 0.0–149.0)
VLDL: 15.4 mg/dL (ref 0.0–40.0)

## 2010-12-25 MED ORDER — TECHNETIUM TC 99M TETROFOSMIN IV KIT
33.0000 | PACK | Freq: Once | INTRAVENOUS | Status: AC | PRN
  Administered 2010-12-25: 33 via INTRAVENOUS

## 2010-12-25 MED ORDER — TECHNETIUM TC 99M TETROFOSMIN IV KIT
11.0000 | PACK | Freq: Once | INTRAVENOUS | Status: AC | PRN
  Administered 2010-12-25: 11 via INTRAVENOUS

## 2010-12-25 MED ORDER — NITROGLYCERIN 0.4 MG SL SUBL
0.4000 mg | SUBLINGUAL_TABLET | Freq: Once | SUBLINGUAL | Status: AC
  Administered 2010-12-25: 0.4 mg via SUBLINGUAL

## 2010-12-25 MED ORDER — REGADENOSON 0.4 MG/5ML IV SOLN
0.4000 mg | Freq: Once | INTRAVENOUS | Status: AC
  Administered 2010-12-25: 0.4 mg via INTRAVENOUS

## 2010-12-25 NOTE — Progress Notes (Signed)
[facility] [address] NUCLEAR MED [address] [place] [address] [phone]  Cardiology Nuclear Med Study  Morgan Bean is a 45 y.o. female [id number] 05/29/1965   Nuclear Med Background Indication for Stress Test:  Evaluation for Ischemia History:  9/11 Echo:EF=65%, moderate pulmonary HTN with moderate to severe TR, mild LVH, mild AI; 12/25/10 Echo:Normal LVF, severe LVH, moderate to secere TR, mild pulmonary HTN now. Cardiac Risk Factors: CVA, Family History - CAD, Hypertension, Lipids and Smoker  Symptoms:  Chest Pain/Pressure with and without Exertion (chest discomfort is "constant", now 2/10), Dizziness, DOE, Fatigue, Palpitations, Rapid HR and SOB   Nuclear Pre-Procedure Caffeine/Decaff Intake:  None NPO After: 6:30pm   Lungs:  Minimal expiratory wheezes.  Proventil inhaler used prior to exercise.  O2 SAT 98% on RA. IV 0.9% NS with Angio Cath:  20g  IV Site: R Antecubital x 1, tolerated well IV Started by:  Irean Hong, RN  Chest Size (in):  34 Cup Size: B  Height: 5\' 6"  (1.676 m)  Weight:  159 lb (72.122 kg)  BMI:  Body mass index is 25.66 kg/(m^2). Tech Comments:  BP 110/88, HR 62, O2 Sat 99% RA, Minimal expiratory wheeze.  Per Toniann Fail, RN  Patient's study was reviewed by  Marca Ancona and  she was discharged once her pain was down to normal levels. Per Lynford Citizen, CNMT    Nuclear Med Study 1 or 2 day study: 1 day  Stress Test Type:  Treadmill/Lexiscan  Reading MD: Olga Millers, MD  Order Authorizing Provider:  Marca Ancona, MD  Resting Radionuclide: Technetium 8m Tetrofosmin  Resting Radionuclide Dose: 11.0 mCi   Stress Radionuclide:  Technetium 54m Tetrofosmin  Stress Radionuclide Dose: 33.0 mCi           Stress Protocol Rest HR: 61 Stress HR: 136  Rest BP: 117/81 Stress BP: 164/91  Exercise Time (min): 6:00 METS: 4.8   Predicted Max HR: 173 bpm % Max HR: 78.61 bpm Rate Pressure Product: 96295   Dose of Adenosine (mg):  n/a  Dose of Lexiscan: 0.4 mg  Dose of Atropine (mg): n/a Dose of Dobutamine: n/a mcg/kg/min (at max HR)  Stress Test Technologist: Smiley Houseman, CMA-N  Nuclear Technologist:  Domenic Polite, CNMT     Rest Procedure:  Myocardial perfusion imaging was performed at rest 45 minutes following the intravenous administration of Technetium 2m Tetrofosmin.  Rest ECG: LVH with strain.  Stress Procedure:  The patient attempted to walk the treadmill utilizing the Bruce protocol, but was unable to reach her target heart rate.  She was then given IV Lexiscan 0.4 mg over 15-seconds with concurrent low level exercise and then Technetium 59m Tetrofosmin was injected at 30-seconds while the patient continued walking one more minute.  There were marked ST-T wave changes with Lexiscan.  She c/o chest tightness 6/10 with exercise and late into recovery.  She was given NTG 0.4 mg SL six minutes into recovery, with complete relief.   Quantitative spect images were obtained after a 45-minute delay.  Images and EKG were discussed with Dr. Shirlee Latch prior to patient being discharged.  Stress ECG: Uninteretable due to baseline LVH.  QPS Raw Data Images:  Acquisition technically good; normal left ventricular size. Stress Images:  Normal homogeneous uptake in all areas of the myocardium. Rest Images:  Normal homogeneous uptake in all areas of the myocardium. Subtraction (SDS):  No evidence of ischemia. Transient Ischemic Dilatation (Normal <1.22):  0.86 Lung/Heart Ratio (Normal <0.45):  0.20  Quantitative Gated Spect Images QGS EDV:  86 ml QGS ESV:  29 ml QGS cine images:  Normal Wall Motion QGS EF: 67%  Impression Exercise Capacity:  Lexiscan with low level exercise. BP Response:  Normal blood pressure response. Clinical Symptoms:  No chest pain. ECG Impression:   EKG uninterpretable due to LVH. Comparison with Prior Nuclear Study: No images to compare  Overall Impression:  Normal stress nuclear study with no  ischemia or infarction and normal LV function.  Olga Millers

## 2010-12-26 NOTE — Progress Notes (Signed)
No evidence for ischemia or infarction on perfusion images though she had ST changes on Lexiscan ECG.  ECG probably false positive, but will need to followup with her in the office to see what her symptoms are doing.  Please inform patient.  Jaesean Litzau [employer]

## 2010-12-29 ENCOUNTER — Telehealth: Payer: Self-pay | Admitting: Cardiology

## 2010-12-29 DIAGNOSIS — I10 Essential (primary) hypertension: Secondary | ICD-10-CM

## 2010-12-29 DIAGNOSIS — E785 Hyperlipidemia, unspecified: Secondary | ICD-10-CM

## 2010-12-29 MED ORDER — PRAVASTATIN SODIUM 40 MG PO TABS: 40.0000 mg | ORAL_TABLET | Freq: Every evening | ORAL | Status: DC

## 2010-12-29 NOTE — Telephone Encounter (Signed)
Notes Recorded by Jacqlyn Krauss, RN on 12/29/2010 at 11:37 AM Pt notified of results. She agreed to start pravastatin 40mg  daily. She will return for fasting lipid/liver profile 02/24/11. Notes Recorded by Jacqlyn Krauss, RN on 12/29/2010 at 11:14 AM LMTCB Notes Recorded by Marca Ancona, MD on 12/26/2010 at 5:02 PM LDL is high. Would be reasonable to take pravastatin 40 mg daily with lipids/LFTs in 2 months

## 2010-12-29 NOTE — Progress Notes (Signed)
LMTCB

## 2010-12-29 NOTE — Progress Notes (Signed)
Discussed with pt. She will keep appt 01/06/11 with Dr Shirlee Latch.

## 2010-12-29 NOTE — Telephone Encounter (Signed)
Pt returning call from Anne. Please call back.  

## 2010-12-29 NOTE — Progress Notes (Signed)
appt 01/06/11 with Dr Shirlee Latch

## 2011-01-06 ENCOUNTER — Ambulatory Visit (INDEPENDENT_AMBULATORY_CARE_PROVIDER_SITE_OTHER): Payer: Self-pay | Admitting: Cardiology

## 2011-01-06 ENCOUNTER — Encounter: Payer: Self-pay | Admitting: Cardiology

## 2011-01-06 ENCOUNTER — Encounter: Payer: Self-pay | Admitting: *Deleted

## 2011-01-06 DIAGNOSIS — R0989 Other specified symptoms and signs involving the circulatory and respiratory systems: Secondary | ICD-10-CM

## 2011-01-06 DIAGNOSIS — IMO0001 Reserved for inherently not codable concepts without codable children: Secondary | ICD-10-CM

## 2011-01-06 DIAGNOSIS — I079 Rheumatic tricuspid valve disease, unspecified: Secondary | ICD-10-CM

## 2011-01-06 DIAGNOSIS — R0609 Other forms of dyspnea: Secondary | ICD-10-CM

## 2011-01-06 DIAGNOSIS — I10 Essential (primary) hypertension: Secondary | ICD-10-CM

## 2011-01-06 DIAGNOSIS — R079 Chest pain, unspecified: Secondary | ICD-10-CM

## 2011-01-06 DIAGNOSIS — E785 Hyperlipidemia, unspecified: Secondary | ICD-10-CM

## 2011-01-06 DIAGNOSIS — F17200 Nicotine dependence, unspecified, uncomplicated: Secondary | ICD-10-CM

## 2011-01-06 NOTE — Assessment & Plan Note (Signed)
NYHA class III exertional symptoms.  Mild BNP increase.  Severe LV hypertrophy on echo with normal EF, so LV diastolic dysfunction could certainly be playing a role.  Also, patient had moderate to severe tricuspid regurgitation with at least mild pulmonary hypertension.  I am unsure if the TR is primary or is related to more significant pulmonary hypertension.  I am going to do a right heart cath along with the left heart cath.  I will have her stop HCTZ and start Lasix 20 mg daily with KCl 10 mEq daily to see if this helps her dyspnea. BMET prior to cath.

## 2011-01-06 NOTE — Patient Instructions (Signed)
Stop HCTZ (hydrochlorothiazide).  Start Lasix (furosemide) 20mg  daily.  Start KCL (potassium) 10 mEq daily.  Your physician recommends that you return for lab work the first of next week--BMP/CBC/PT 786.09  786.50  397.0  Your physician has requested that you have a cardiac catheterization. Cardiac catheterization is used to diagnose and/or treat various heart conditions. Doctors may recommend this procedure for a number of different reasons. The most common reason is to evaluate chest pain. Chest pain can be a symptom of coronary artery disease (CAD), and cardiac catheterization can show whether plaque is narrowing or blocking your heart's arteries. This procedure is also used to evaluate the valves, as well as measure the blood flow and oxygen levels in different parts of your heart. For further information please visit [email or web address] Please follow instruction sheet, as given.  Your physician recommends that you schedule a follow-up appointment 2 weeks after the cath with Dr Shirlee Latch.

## 2011-01-06 NOTE — Assessment & Plan Note (Signed)
BP is under much better control currently.  It has been poorly controlled in the past (this is corroborated by significant LV hypertrophy).

## 2011-01-06 NOTE — Assessment & Plan Note (Signed)
Patient continues to have episodes of chest pain/tightness and diaphoresis with exertion.  Lexiscan myoview showed significant ST depression with Lexiscan injection but no ischemia or infarction on perfusion images.  Given significant and concerning symptoms and not completely normal myoview, I have set her up for a left heart cath.  Continue ASA 81 mg daily.

## 2011-01-06 NOTE — Assessment & Plan Note (Signed)
LDL high, pravastatin started.  LDL goal will depend on presence or absence of CAD.  Will repeat lipids in 12/12.

## 2011-01-06 NOTE — Progress Notes (Signed)
PCP: Georganna Skeans, [facility]  45 yo with history of HTN and abnormal echo in 9/11 with moderate pulmonary hypertension and moderate to possibly severe tricuspid regurgitation returns for cardiology evaluation.  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  She had an echo done at that time with the aforementioned findings.  Patient has had poorly controlled hypertension.  She has had constant chest aching for over a year.  This gets worse with exertion.  She noted severe chest pain and diaphoresis over the weekend when she was trying to wash clothes.  Any moderate to heavy exertion will lead to central chest tightness.  She was apparently told 3 years ago that she had had an MI, but she does not think she has had a stress test and she has not had a heart catheterization in our system.  She has also had quite significant exertional dyspnea for over a year.  She has marked dyspnea walking up steps.  She cannot walk more than a block without stopping.  She has occasional episodes that could be consistent with PND.  She continues to smoke but is interested in quitting.    After last appointment, I had her do a [employer]  This showed ischemic-type ST depression with infusion of Lexiscan but no ischemia or infarction on perfusion images.  Repeat echo showed EF 60-65% with severe LV hypertrophy, mild MR and AI, moderate to severe tricuspid regurgitation, PA systolic pressure 38 mmHg.  BP is now better with increased lisinopril.  It has been running in the 110s-120s systolic at home.   Pravastatin was started with increased LDL.   Labs (9/12): HCT 31.5, BNP 132, K 4, creatinine 1.2 Labs (10/12): LDL 142, HDL 46  PMH: 1. Fe-deficiency anemia 2. HTN 3. Asthma 4. Active smoker 5. Prior cocaine 6. 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  ANA positive.  Thought to be secondary to  cocaine use.   8. Pulmonary hypertension/tricuspid regurgitation: Echo (9/11) with EF 65%, mild LVH, mild AI, mild MR, moderate to possibly severe TR with PA systolic pressure 52 mmHg.  Echo (10/12): severe LV hypertrophy, EF 60-65%, mild MR, mild AI, moderate to severe tricuspid regurgitation, PA systolic pressure 38 mmHg.   9. Chest pain: Lexiscan myoview (10/12) with significant ST depression upon Lexiscan injection but no ischemia or infarction on perfusion images.   SH: Active smoker (1 ppd > 20 years), has 1 daughter, unemployed.  Prior cocaine abuse.    FH: Grandmother with "heart problems."  Father with 2 open heart surgeries in his late 54s.  Brother with PCI at age 61.   ROS: All systems reviewed and negative except as per HPI.   Current Outpatient Prescriptions  Medication Sig Dispense Refill  . Albuterol Sulfate (PROVENTIL HFA IN) Inhale into the lungs. 2 puffs every 4 hours as needed       . aspirin 81 MG tablet Take 81 mg by mouth daily.        . calcium carbonate (OS-CAL) 600 MG TABS Take 600 mg by mouth. 1 tab bid      . cloNIDine (CATAPRES) 0.2 MG tablet Take 0.2 mg by mouth 2 (two) times daily.        [name] docusate sodium (COLACE) 100 MG capsule Take 100 mg by mouth 2 (two) times daily.        [name] lisinopril (PRINIVIL,ZESTRIL) 20 MG tablet Take  1 tablet (20 mg total) by mouth daily.  30 tablet  11  . loratadine (CLARITIN) 10 MG tablet Take 10 mg by mouth daily.        . Magnesium Oxide -Mg Supplement 400 MG CAPS Take by mouth. 1 tab daily       . NON FORMULARY ferosul 325 mg  Take 1 tab bid       . pravastatin (PRAVACHOL) 40 MG tablet Take 1 tablet (40 mg total) by mouth every evening.  30 tablet  3  . varenicline (CHANTIX) 1 MG tablet As directed       . Vitamin D, Ergocalciferol, (DRISDOL) 50000 UNITS CAPS Take 50,000 Units by mouth.        . furosemide (LASIX) 20 MG tablet Take 1 tablet (20 mg total) by mouth daily.      . potassium chloride (KLOR-CON 10) 10 MEQ CR tablet  Take 1 tablet (10 mEq total) by mouth daily.        BP 116/80  Pulse 68  Ht 5\' 6"  (1.676 m)  Wt 170 lb 6.4 oz (77.293 kg)  BMI 27.50 kg/m2  LMP 11/22/2010 General: NAD Neck: JVP 7-8 cm, no thyromegaly or thyroid nodule.  Lungs: Clear to auscultation bilaterally with normal respiratory effort. CV: Nondisplaced PMI.  Heart regular S1/S2, no S3/S4, 2/6 LLSB systolic murmur.  No peripheral edema.  No carotid bruit.  Normal pedal pulses.  Abdomen: Soft, nontender, no hepatosplenomegaly, no distention.  Skin: Intact without lesions or rashes.  Neurologic: Alert and oriented x 3.  Psych: Normal affect. Extremities: No clubbing or cyanosis.  HEENT: Normal.

## 2011-01-06 NOTE — Assessment & Plan Note (Signed)
She has been taking Chantix and is trying to quit.

## 2011-01-15 ENCOUNTER — Other Ambulatory Visit (INDEPENDENT_AMBULATORY_CARE_PROVIDER_SITE_OTHER): Payer: Self-pay | Admitting: *Deleted

## 2011-01-15 DIAGNOSIS — R0609 Other forms of dyspnea: Secondary | ICD-10-CM

## 2011-01-15 DIAGNOSIS — I079 Rheumatic tricuspid valve disease, unspecified: Secondary | ICD-10-CM

## 2011-01-15 DIAGNOSIS — R0989 Other specified symptoms and signs involving the circulatory and respiratory systems: Secondary | ICD-10-CM

## 2011-01-15 DIAGNOSIS — R079 Chest pain, unspecified: Secondary | ICD-10-CM

## 2011-01-15 LAB — CBC WITH DIFFERENTIAL/PLATELET
Basophils Absolute: 0 10*3/uL (ref 0.0–0.1)
Eosinophils Absolute: 0.4 10*3/uL (ref 0.0–0.7)
HCT: 30.6 % — ABNORMAL LOW (ref 36.0–46.0)
Lymphs Abs: 1.9 10*3/uL (ref 0.7–4.0)
MCHC: 33.3 g/dL (ref 30.0–36.0)
MCV: 76.3 fl — ABNORMAL LOW (ref 78.0–100.0)
Monocytes Absolute: 0.3 10*3/uL (ref 0.1–1.0)
Platelets: 222 10*3/uL (ref 150.0–400.0)
RDW: 22.7 % — ABNORMAL HIGH (ref 11.5–14.6)

## 2011-01-15 LAB — BASIC METABOLIC PANEL
Calcium: 8.9 mg/dL (ref 8.4–10.5)
GFR: 55.34 mL/min — ABNORMAL LOW (ref >60.00)
Glucose, Bld: 92 mg/dL (ref 70–99)
Sodium: 138 mEq/L (ref 135–145)

## 2011-01-16 ENCOUNTER — Inpatient Hospital Stay (HOSPITAL_BASED_OUTPATIENT_CLINIC_OR_DEPARTMENT_OTHER)
Admission: RE | Admit: 2011-01-16 | Discharge: 2011-01-16 | Disposition: A | Discharge status: Home / Self Care | Payer: Self-pay | Source: Ambulatory Visit | Attending: Cardiology | Admitting: Cardiology

## 2011-01-16 DIAGNOSIS — R079 Chest pain, unspecified: Secondary | ICD-10-CM | POA: Insufficient documentation

## 2011-01-16 DIAGNOSIS — I079 Rheumatic tricuspid valve disease, unspecified: Secondary | ICD-10-CM | POA: Insufficient documentation

## 2011-01-16 DIAGNOSIS — R0609 Other forms of dyspnea: Secondary | ICD-10-CM | POA: Insufficient documentation

## 2011-01-16 DIAGNOSIS — I2510 Atherosclerotic heart disease of native coronary artery without angina pectoris: Secondary | ICD-10-CM | POA: Insufficient documentation

## 2011-01-16 DIAGNOSIS — I2789 Other specified pulmonary heart diseases: Secondary | ICD-10-CM | POA: Insufficient documentation

## 2011-01-16 DIAGNOSIS — R0989 Other specified symptoms and signs involving the circulatory and respiratory systems: Secondary | ICD-10-CM | POA: Insufficient documentation

## 2011-01-16 LAB — POCT I-STAT 3, VENOUS BLOOD GAS (G3P V)
O2 Saturation: 73 %
pCO2, Ven: 34.6 mmHg — ABNORMAL LOW (ref 45.0–50.0)
pO2, Ven: 39 mmHg (ref 30.0–45.0)

## 2011-01-16 LAB — POCT I-STAT 3, ART BLOOD GAS (G3+)
Acid-base deficit: 4 mmol/L — ABNORMAL HIGH (ref 0.0–2.0)
pCO2 arterial: 34.5 mmHg — ABNORMAL LOW (ref 35.0–45.0)
pO2, Arterial: 91 mmHg (ref 80.0–100.0)

## 2011-01-24 NOTE — Cardiovascular Report (Signed)
[name], Morgan Bean NO.:  [id number]  MEDICAL RECORD NO.:  [id number]  LOCATION:                                 FACILITY:  PHYSICIAN:  Marca Ancona, MD      DATE OF BIRTH:  Apr 18, 1965  DATE OF PROCEDURE:  01/16/2011 DATE OF DISCHARGE:                           CARDIAC CATHETERIZATION   PROCEDURES: 1. Left heart catheterization. 2. Right heart catheterization. 3. Coronary angiography.  INDICATION:  This is a 45 year old who has had significant exertional dyspnea and chest pain for a number of months.  Echo showed evidence for pulmonary hypertension and severe tricuspid regurgitation.  The Myoview showed an ischemic EKG response, but normal perfusion images.  It was therefore decided to take her for left and right heart catheterization to assess her filling pressures and also to assess for coronary artery disease.  PROCEDURE NOTE:  After informed consent was obtained, the right groin was sterilely prepped and draped.  Lidocaine 1% was used to locally anesthetize right groin area.  The right common femoral artery was entered using modified Seldinger technique and a 4-French arterial sheath was placed.  The right common femoral vein was accessed using modified Seldinger technique.  A 7-French venous sheath was placed.  The left heart catheterization was done first.  The right coronary artery was engaged with the 3-DRC catheter.  The left coronary artery was engaged using the JL-4 catheter.  The left ventricle was entered using angled pigtail catheter.  The right heart catheterization was done next using a balloon tip Swan-Ganz catheter.  Samples were removed for oxygen saturation from the femoral artery as well as from the pulmonary artery.  FINDINGS: 1. Hemodynamics:  Aorta 144/80, left ventricle 130/17, mean right     atrial pressure 4 mmHg, RV 30/6, PA 28/9, mean pulmonary capillary     wedge pressure 9 mmHg.  Aortic saturation 97%, PA saturation  73%.     Cardiac output 7.0 L/min.  Cardiac index 3.8. 2. Left ventriculography:  Left ventriculography was not done due to     mild chronic kidney disease and normal EF on recent echo. 3. Right coronary artery:  The right coronary artery had mild luminal     irregularities.  It was a dominant vessel. 4. Left main:  The left main had no angiographic disease. 5. Left circumflex system:  The left circumflex was diffusely rather     small.  There was no angiographic disease. 6. LAD system:  The LAD was also diffusely rather small.  There was a     moderate-sized first diagonal with about 30% ostial stenosis, small     second diagonal with about 50% ostial stenosis and  there was about     a 30% mid LAD stenosis.  With 200 mcg of intracoronary nitroglycerin,     the LAD and circumflex did increase in caliber, though not     markedly.  IMPRESSION:  The patient has nonobstructive mild coronary artery disease and she has normal filling pressures.  Of note, the patient's initial blood pressure was about 180/100.  She was treated with IV hydralazine with a fall in her blood pressure.  Her pressures  at right heart catheterization were therefore taken with a normalized systemic blood pressure.  Although her right and left heart filling pressures were normal on this catheterization, it is quite possible that her shortness of breath has in the past been in the setting of significant diastolic dysfunction with high blood pressure.  This could potentially also explain her high pulmonary artery pressures noted on echocardiogram.  At this point, it will be most imperative to maintain good blood pressure control.  The patient's blood pressure was actually good when I saw her in the clinic and her pressures have been good when she measures it at home.     Marca Ancona, MD     DM/MEDQ  D:  01/16/2011  T:  01/16/2011  Job:  [id number]  cc:   Georganna Skeans, MD  Electronically Signed by Marca Ancona MD on 01/24/2011 11:37:25 PM

## 2011-02-04 ENCOUNTER — Ambulatory Visit (INDEPENDENT_AMBULATORY_CARE_PROVIDER_SITE_OTHER): Payer: Self-pay | Admitting: Cardiology

## 2011-02-04 ENCOUNTER — Encounter: Payer: Self-pay | Admitting: Cardiology

## 2011-02-04 DIAGNOSIS — I10 Essential (primary) hypertension: Secondary | ICD-10-CM

## 2011-02-04 DIAGNOSIS — R0609 Other forms of dyspnea: Secondary | ICD-10-CM

## 2011-02-04 DIAGNOSIS — R079 Chest pain, unspecified: Secondary | ICD-10-CM

## 2011-02-04 DIAGNOSIS — I509 Heart failure, unspecified: Secondary | ICD-10-CM

## 2011-02-04 DIAGNOSIS — E785 Hyperlipidemia, unspecified: Secondary | ICD-10-CM

## 2011-02-04 DIAGNOSIS — I5032 Chronic diastolic (congestive) heart failure: Secondary | ICD-10-CM

## 2011-02-04 MED ORDER — AMLODIPINE BESYLATE 5 MG PO TABS: 5.0000 mg | ORAL_TABLET | Freq: Every day | ORAL | Status: DC

## 2011-02-04 NOTE — Patient Instructions (Signed)
Start amlodipine 5mg  daily.  Your physician recommends that you schedule a follow-up appointment in: 1 month with Brynda Rim   Your physician recommends that you schedule a follow-up appointment in: 4 months with Dr Shirlee Latch.

## 2011-02-05 NOTE — Assessment & Plan Note (Signed)
Mild coronary disease on left heart cath, nothing to explain chest pain.  Would continue ASA 81 mg daily.

## 2011-02-05 NOTE — Progress Notes (Signed)
PCP: Georganna Skeans, [facility]  45 yo with history of HTN and abnormal echo in 9/11 with moderate pulmonary hypertension and moderate to possibly severe tricuspid regurgitation returns for cardiology evaluation.  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  She had an echo done at that time with the aforementioned findings.  Patient has had poorly controlled hypertension.  Patient had reported episodes of exertional chest pain.  She also had had quite significant exertional dyspnea for over a year.     I had her do a [employer], physiological.  This showed ischemic-type ST depression with infusion of Lexiscan but no ischemia or infarction on perfusion images.  Repeat echo showed EF 60-65% with severe LV hypertrophy, mild MR and AI, moderate to severe tricuspid regurgitation, PA systolic pressure 38 mmHg.    Patient was started on Lasix and left and right heart cath was done.  There was minimal CAD, and filling pressures were normal.  Since starting on Lasix, Morgan Bean's dyspnea seems to have totally resolved.  She jogs in place for 20 minutes at a time for exercise and does not get short of breath with this activity.  She has essentially quit smoking (very rare cigarette). BP remains elevated.    Labs (9/12): HCT 31.5, BNP 132, K 4, creatinine 1.2 Labs (10/12): LDL 142, HDL 46, K 4.2, creatinine 1.3  PMH: 1. Fe-deficiency anemia 2. HTN 3. Asthma 4. Active smoker 5. Prior cocaine 6. 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  ANA positive.  Thought to be secondary to cocaine use.   8. Pulmonary hypertension/tricuspid regurgitation: Echo (9/11) with EF 65%, mild LVH, mild AI, mild MR, moderate to possibly severe TR with PA systolic pressure 52 mmHg.  Echo (10/12): severe LV hypertrophy, EF 60-65%, mild MR, mild AI, moderate to severe tricuspid regurgitation, PA systolic pressure 38 mmHg.     9. Chest pain: Lexiscan myoview (10/12) with significant ST depression upon Lexiscan injection but no ischemia or infarction on perfusion images.  Left heart cath (10/12): 30% mid LAD, 30% ostial D1, 50% ostial D2.  10. Diastolic CHF  SH: Active smoker (1 ppd > 20 years), has 1 daughter, unemployed.  Prior cocaine abuse.    FH: Grandmother with "heart problems."  Father with 2 open heart surgeries in his late 28s.  Brother with PCI at age 27.   ROS: All systems reviewed and negative except as per HPI.   Current Outpatient Prescriptions  Medication Sig Dispense Refill  . Albuterol Sulfate (PROVENTIL HFA IN) Inhale into the lungs. 2 puffs every 4 hours as needed       . aspirin 81 MG tablet Take 81 mg by mouth daily.        . cloNIDine (CATAPRES) 0.2 MG tablet Take 0.2 mg by mouth 2 (two) times daily.        [name] docusate sodium (COLACE) 100 MG capsule Take 100 mg by mouth 2 (two) times daily.        . furosemide (LASIX) 20 MG tablet Take 1 tablet (20 mg total) by mouth daily.      [name] lisinopril (PRINIVIL,ZESTRIL) 20 MG tablet Take 1 tablet (20 mg total) by mouth daily.  30 tablet  11  . loratadine (CLARITIN) 10 MG tablet Take 10 mg by mouth daily.        . Magnesium Oxide -Mg Supplement 400 MG CAPS Take by mouth. 1  tab daily       . NON FORMULARY ferosul 325 mg  Take 1 tab bid       . potassium chloride (KLOR-CON 10) 10 MEQ CR tablet Take 1 tablet (10 mEq total) by mouth daily.      . pravastatin (PRAVACHOL) 40 MG tablet Take 1 tablet (40 mg total) by mouth every evening.  30 tablet  3  . amLODipine (NORVASC) 5 MG tablet Take 1 tablet (5 mg total) by mouth daily.  30 tablet  6  . Vitamin D, Ergocalciferol, (DRISDOL) 50000 UNITS CAPS Take 50,000 Units by mouth.          BP 152/92  Pulse 68  Ht 5\' 6"  (1.676 m)  Wt 78.019 kg (172 lb)  BMI 27.76 kg/m2 General: NAD Neck: JVP 7 cm, no thyromegaly or thyroid nodule.  Lungs: Clear to auscultation bilaterally with normal respiratory effort. CV:  Nondisplaced PMI.  Heart regular S1/S2, no S3/S4, 1/6 LLSB systolic murmur.  No peripheral edema.  No carotid bruit.  Normal pedal pulses.  Abdomen: Soft, nontender, no hepatosplenomegaly, no distention.  Neurologic: Alert and oriented x 3.  Psych: Normal affect. Extremities: No clubbing or cyanosis.

## 2011-02-05 NOTE — Assessment & Plan Note (Signed)
Would aim for LDL < 100.  Check lipids in 12/12.

## 2011-02-05 NOTE — Assessment & Plan Note (Signed)
BP is still running high, but improved.  I will add amlodipine 5 mg daily to her regimen.  She will followup in 1 month with the PA for medication titration and 4 months with me.

## 2011-02-05 NOTE — Assessment & Plan Note (Signed)
This has resolved with BP control and Lasix.  I suspect that the dyspnea was due to diastolic CHF (poor blood pressure control, LVH).  Right heart cath showed well-controlled right and left heart filling pressures on Lasix.

## 2011-02-24 ENCOUNTER — Ambulatory Visit (INDEPENDENT_AMBULATORY_CARE_PROVIDER_SITE_OTHER): Payer: Self-pay | Admitting: Physician Assistant

## 2011-02-24 ENCOUNTER — Encounter: Payer: Self-pay | Admitting: Physician Assistant

## 2011-02-24 ENCOUNTER — Other Ambulatory Visit (INDEPENDENT_AMBULATORY_CARE_PROVIDER_SITE_OTHER): Payer: Self-pay | Admitting: *Deleted

## 2011-02-24 VITALS — BP 140/98 | HR 75 | Ht 66.0 in | Wt 168.8 lb

## 2011-02-24 DIAGNOSIS — F17200 Nicotine dependence, unspecified, uncomplicated: Secondary | ICD-10-CM

## 2011-02-24 DIAGNOSIS — I10 Essential (primary) hypertension: Secondary | ICD-10-CM

## 2011-02-24 DIAGNOSIS — I5033 Acute on chronic diastolic (congestive) heart failure: Secondary | ICD-10-CM | POA: Insufficient documentation

## 2011-02-24 DIAGNOSIS — E785 Hyperlipidemia, unspecified: Secondary | ICD-10-CM

## 2011-02-24 DIAGNOSIS — I509 Heart failure, unspecified: Secondary | ICD-10-CM

## 2011-02-24 DIAGNOSIS — I2510 Atherosclerotic heart disease of native coronary artery without angina pectoris: Secondary | ICD-10-CM

## 2011-02-24 DIAGNOSIS — I5032 Chronic diastolic (congestive) heart failure: Secondary | ICD-10-CM | POA: Insufficient documentation

## 2011-02-24 LAB — LIPID PANEL
HDL: 47.3 mg/dL (ref >39.00)
Total CHOL/HDL Ratio: 3

## 2011-02-24 LAB — HEPATIC FUNCTION PANEL
ALT: 27 U/L (ref 0–35)
AST: 27 U/L (ref 0–37)
Bilirubin, Direct: 0.1 mg/dL (ref 0.0–0.3)
Total Bilirubin: 0.2 mg/dL — ABNORMAL LOW (ref 0.3–1.2)

## 2011-02-24 MED ORDER — CLONIDINE HCL 0.3 MG PO TABS: 0.3000 mg | ORAL_TABLET | Freq: Two times a day (BID) | ORAL | Status: DC

## 2011-02-24 NOTE — Assessment & Plan Note (Signed)
No angina.  Continue ASA and statin. 

## 2011-02-24 NOTE — Patient Instructions (Signed)
Your physician recommends that you schedule a follow-up appointment in: 04/13/11 @ 9:30 to see Tereso Newcomer, PA-C  Your physician has recommended you make the following change in your medication: INCREASE CLONIDINE TO 0.3 MG TWICE DAILY, A NEW PRESCRIPTION HAS BEEN GIVEN TO YOU TODAY

## 2011-02-24 NOTE — Assessment & Plan Note (Signed)
BPs at home still elevated.  She has not noticed that much difference with the start of Norvasc.  Will try to adjust clonidine to 0.3 mg BID.  Follow up with me in 4-6 weeks.  If BP still up, consider increasing Lisinopril or Norvasc at that time.

## 2011-02-24 NOTE — Progress Notes (Signed)
[address] [place], [place]  [address] Phone: [phone] Fax:  [phone]  Date:  02/24/2011   Name:  Morgan Bean       DOB:  [birth year] MRN:  [id number]  PCP: Katrina Stack Primary Cardiologist:  Dr. Marca Ancona    History of Present Illness: Morgan Bean is a 45 y.o. female with history of HTN and abnormal echo in 9/11 with moderate pulmonary hypertension and moderate to possibly severe tricuspid regurgitation for follow up on BP. 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 She had an echo done at that time with the aforementioned findings. Patient has had poorly controlled hypertension. Patient had reported episodes of exertional chest pain. She also had had quite significant exertional dyspnea for over a year.  A Lexiscan myoview showed ischemic-type ST depression with infusion of Lexiscan but no ischemia or infarction on perfusion images. Repeat echo showed EF 60-65% with severe LV hypertrophy, mild MR and AI, moderate to severe tricuspid regurgitation, PA systolic pressure 38 mmHg.  She was started on Lasix and left and right heart cath was done. There was minimal CAD, and filling pressures were normal. After starting Lasix, her dyspnea seemed to have totally resolved.  It was thought that her dyspnea was related to diastolic dysfunction in the setting of uncontrolled HTN.  When last seen by Dr. Marca Ancona, was started on amlodipine for elevated BP and asked to return today for follow up.    Labs (9/12): HCT 31.5, BNP 132, K 4, creatinine 1.2  Labs (10/12): LDL 142, HDL 46, K 4.2, creatinine 1.3   The patient denies chest pain, shortness of breath, syncope, orthopnea, PND or significant pedal edema.  Still jogging in place for 20 mins 3 times a day without chest pain or dyspnea.    Past Medical History:  1. Fe-deficiency anemia  2. HTN  3. Asthma  4. Active smoker  5. Prior  cocaine  6. 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 ANA positive. Thought to be secondary to cocaine use.  8. Pulmonary hypertension/tricuspid regurgitation: Echo (9/11) with EF 65%, mild LVH, mild AI, mild MR, moderate to possibly severe TR with PA systolic pressure 52 mmHg. Echo (10/12): severe LV hypertrophy, EF 60-65%, mild MR, mild AI, moderate to severe tricuspid regurgitation, PA systolic pressure 38 mmHg.  9. Chest pain: Lexiscan myoview (10/12) with significant ST depression upon Lexiscan injection but no ischemia or infarction on perfusion images. Left heart cath (10/12): 30% mid LAD, 30% ostial D1, 50% ostial D2.  10. Diastolic CHF   Current Outpatient Prescriptions  Medication Sig Dispense Refill  . Albuterol Sulfate (PROVENTIL HFA IN) Inhale into the lungs. 2 puffs every 4 hours as needed       . amLODipine (NORVASC) 5 MG tablet Take 1 tablet (5 mg total) by mouth daily.  30 tablet  6  . aspirin 81 MG tablet Take 81 mg by mouth daily.        [name] CALCIUM PO Take by mouth daily.        . cloNIDine (CATAPRES) 0.2 MG tablet Take 0.2 mg by mouth 2 (two) times daily.        [name] docusate sodium (COLACE) 100 MG capsule Take 100 mg by mouth 2 (two) times daily.        . furosemide (LASIX) 20 MG tablet Take 1 tablet (20  mg total) by mouth daily.      [name] lisinopril (PRINIVIL,ZESTRIL) 20 MG tablet Take 1 tablet (20 mg total) by mouth daily.  30 tablet  11  . loratadine (CLARITIN) 10 MG tablet Take 10 mg by mouth daily.        . Magnesium Oxide -Mg Supplement 400 MG CAPS Take by mouth. 1 tab daily       . NON FORMULARY ferosul 325 mg  Take 1 tab bid       . potassium chloride (KLOR-CON 10) 10 MEQ CR tablet Take 1 tablet (10 mEq total) by mouth daily.      . pravastatin (PRAVACHOL) 40 MG tablet Take 1 tablet (40 mg total) by mouth every evening.  30 tablet  3    Allergies: Allergies  Allergen Reactions  . Ciprofloxacin      History  Substance Use Topics  . Smoking status: Current Some Day Smoker  . Smokeless tobacco: Not on file  . Alcohol Use: Yes     ROS:  Please see the history of present illness.   Neurological ROS: negative for - headaches or visual changes    PHYSICAL EXAM: Vital Signs:  BP 140/98  Pulse 75  Ht 5\' 6"  (1.676 m)  Wt 168 lb 12.8 oz (76.567 kg)  BMI 27.24 kg/m2 Repeat BP by me 140/100 bilaterally Well nourished, well developed, in no acute distress HEENT: normal Neck: no JVD Cardiac:  normal S1, S2; RRR; no murmur Lungs:  clear to auscultation bilaterally, no wheezing, rhonchi or rales Abd: soft, nontender, no hepatomegaly Ext: no edema Skin: warm and dry Neuro:  CNs 2-12 intact, no focal abnormalities noted  EKG:   NSR, HR 75, normal axis, NSSTTW changes, no significant change from prior   ASSESSMENT AND PLAN:

## 2011-02-24 NOTE — Assessment & Plan Note (Signed)
She has been set up for FLP and LFTs today.

## 2011-02-24 NOTE — Assessment & Plan Note (Signed)
Volume stable.  No change in Lasix.  Keep follow up with Dr. Marca Ancona in 05/2011.

## 2011-02-24 NOTE — Assessment & Plan Note (Signed)
She is trying to quit. 

## 2011-03-09 ENCOUNTER — Ambulatory Visit: Payer: Self-pay | Admitting: Internal Medicine

## 2011-04-13 ENCOUNTER — Ambulatory Visit: Payer: Self-pay | Admitting: Physician Assistant

## 2011-04-14 ENCOUNTER — Ambulatory Visit (INDEPENDENT_AMBULATORY_CARE_PROVIDER_SITE_OTHER): Payer: Self-pay | Admitting: Physician Assistant

## 2011-04-14 ENCOUNTER — Encounter: Payer: Self-pay | Admitting: Physician Assistant

## 2011-04-14 DIAGNOSIS — I10 Essential (primary) hypertension: Secondary | ICD-10-CM

## 2011-04-14 DIAGNOSIS — I509 Heart failure, unspecified: Secondary | ICD-10-CM

## 2011-04-14 DIAGNOSIS — I5032 Chronic diastolic (congestive) heart failure: Secondary | ICD-10-CM

## 2011-04-14 NOTE — Assessment & Plan Note (Signed)
Blood pressure much better controlled.  No changes in therapy.  Keep followup with Dr. Marca Ancona as scheduled.

## 2011-04-14 NOTE — Patient Instructions (Signed)
Your physician recommends that you schedule a follow-up appointment in: FOLLOW UP AS ALREADY SCHEDULED  NO CHANGES TODAY

## 2011-04-14 NOTE — Assessment & Plan Note (Signed)
Volume appears stable.  Continue current therapy. 

## 2011-04-14 NOTE — Progress Notes (Signed)
[address] [place], [place]  [address] Phone: [phone] Fax:  [phone]  Date:  04/14/2011   Name:  Morgan Bean       DOB:  1965/04/26 MRN:  [id number]  PCP: Katrina Stack Primary Cardiologist:  Dr. Marca Ancona    History of Present Illness: Morgan Bean is a 46 y.o. female with history of HTN and abnormal echo in 9/11 with moderate pulmonary hypertension and moderate to possibly severe tricuspid regurgitation for follow up on BP. 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 She had an echo done at that time with the aforementioned findings. Patient has had poorly controlled hypertension. Patient had reported episodes of exertional chest pain. She also had had quite significant exertional dyspnea for over a year.  A Lexiscan myoview showed ischemic-type ST depression with infusion of Lexiscan but no ischemia or infarction on perfusion images. Repeat echo showed EF 60-65% with severe LV hypertrophy, mild MR and AI, moderate to severe tricuspid regurgitation, PA systolic pressure 38 mmHg.  She was started on Lasix and left and right heart cath was done. There was minimal CAD, and filling pressures were normal. After starting Lasix, her dyspnea seemed to have totally resolved.  It was thought that her dyspnea was related to diastolic dysfunction in the setting of uncontrolled HTN.    Labs (9/12): HCT 31.5, BNP 132, K 4, creatinine 1.2  Labs (10/12): LDL 142, HDL 46, K 4.2, creatinine 1.3   I saw her 12/4 for follow up on blood pressure.  Her blood pressure remained elevated and I changed her clonidine to 0.3 mg twice a day.  She returns for followup.  She is doing well.  She has occasional chest pain.  This is much improved since starting Lasix.  She's working out on a treadmill at home for 15 minutes a day without significant shortness of breath.  She has been awakening in the middle of the  night recently but this is related to her asthma.  She denies edema.  Past Medical History:  1. Fe-deficiency anemia  2. HTN  3. Asthma  4. Active smoker  5. Prior cocaine  6. 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 ANA positive. Thought to be secondary to cocaine use.  8. Pulmonary hypertension/tricuspid regurgitation: Echo (9/11) with EF 65%, mild LVH, mild AI, mild MR, moderate to possibly severe TR with PA systolic pressure 52 mmHg. Echo (10/12): severe LV hypertrophy, EF 60-65%, mild MR, mild AI, moderate to severe tricuspid regurgitation, PA systolic pressure 38 mmHg.  9. Chest pain: Lexiscan myoview (10/12) with significant ST depression upon Lexiscan injection but no ischemia or infarction on perfusion images. Left heart cath (10/12): 30% mid LAD, 30% ostial D1, 50% ostial D2.  10. Diastolic CHF   Current Outpatient Prescriptions  Medication Sig Dispense Refill  . Albuterol Sulfate (PROVENTIL HFA IN) Inhale into the lungs. 2 puffs every 4 hours as needed       . amLODipine (NORVASC) 5 MG tablet Take 1 tablet (5 mg total) by mouth daily.  30 tablet  6  . aspirin 81 MG tablet Take 81 mg by mouth daily.        . cloNIDine (CATAPRES) 0.3 MG tablet Take 1 tablet (0.3 mg total) by mouth 2 (two) times daily.  60 tablet  11  . docusate sodium (COLACE) 100 MG capsule Take 100  mg by mouth 2 (two) times daily.        . furosemide (LASIX) 20 MG tablet Take 1 tablet (20 mg total) by mouth daily.      [name] lisinopril (PRINIVIL,ZESTRIL) 20 MG tablet Take 1 tablet (20 mg total) by mouth daily.  30 tablet  11  . loratadine (CLARITIN) 10 MG tablet Take 10 mg by mouth daily.        . Magnesium Oxide -Mg Supplement 400 MG CAPS Take by mouth. 1 tab daily       . NON FORMULARY ferosul 325 mg  Take 1 tab bid       . potassium chloride (KLOR-CON 10) 10 MEQ CR tablet Take 1 tablet (10 mEq total) by mouth daily.      . pravastatin  (PRAVACHOL) 40 MG tablet Take 1 tablet (40 mg total) by mouth every evening.  30 tablet  3  . CALCIUM PO Take by mouth daily.          Allergies: Allergies  Allergen Reactions  . Ciprofloxacin     History  Substance Use Topics  . Smoking status: Current Some Day Smoker  . Smokeless tobacco: Not on file  . Alcohol Use: Yes     PHYSICAL EXAM: Vital Signs:  BP 118/76  Resp 18  Ht 5\' 6"  (1.676 m)  Wt 172 lb (78.019 kg)  BMI 27.76 kg/m2  Well nourished, well developed, in no acute distress HEENT: normal Neck: no JVD Cardiac:  normal S1, S2; RRR; no murmur Lungs:  clear to auscultation bilaterally, no wheezing, rhonchi or rales Abd: soft, nontender, no hepatomegaly Ext: no edema Skin: warm and dry Neuro:  CNs 2-12 intact, no focal abnormalities noted  EKG:   Sinus rhythm, heart rate 62, normal axis, nonspecific ST-T wave changes  ASSESSMENT AND PLAN:

## 2011-06-04 ENCOUNTER — Ambulatory Visit: Payer: Self-pay | Admitting: Cardiology

## 2011-08-08 ENCOUNTER — Encounter (HOSPITAL_COMMUNITY): Payer: Self-pay | Admitting: Emergency Medicine

## 2011-08-08 ENCOUNTER — Inpatient Hospital Stay (HOSPITAL_COMMUNITY)
Admission: EM | Admit: 2011-08-08 | Discharge: 2011-08-11 | DRG: 571 | Disposition: A | Discharge status: Home / Self Care | Payer: MEDICAID | Attending: Family Medicine | Admitting: Family Medicine

## 2011-08-08 DIAGNOSIS — I079 Rheumatic tricuspid valve disease, unspecified: Secondary | ICD-10-CM

## 2011-08-08 DIAGNOSIS — L0390 Cellulitis, unspecified: Secondary | ICD-10-CM | POA: Diagnosis present

## 2011-08-08 DIAGNOSIS — IMO0002 Reserved for concepts with insufficient information to code with codable children: Principal | ICD-10-CM | POA: Diagnosis present

## 2011-08-08 DIAGNOSIS — Z79899 Other long term (current) drug therapy: Secondary | ICD-10-CM

## 2011-08-08 DIAGNOSIS — E785 Hyperlipidemia, unspecified: Secondary | ICD-10-CM

## 2011-08-08 DIAGNOSIS — L0291 Cutaneous abscess, unspecified: Secondary | ICD-10-CM

## 2011-08-08 DIAGNOSIS — I509 Heart failure, unspecified: Secondary | ICD-10-CM | POA: Diagnosis present

## 2011-08-08 DIAGNOSIS — I5032 Chronic diastolic (congestive) heart failure: Secondary | ICD-10-CM

## 2011-08-08 DIAGNOSIS — R0609 Other forms of dyspnea: Secondary | ICD-10-CM

## 2011-08-08 DIAGNOSIS — Z7982 Long term (current) use of aspirin: Secondary | ICD-10-CM

## 2011-08-08 DIAGNOSIS — I5033 Acute on chronic diastolic (congestive) heart failure: Secondary | ICD-10-CM | POA: Diagnosis present

## 2011-08-08 DIAGNOSIS — Z23 Encounter for immunization: Secondary | ICD-10-CM

## 2011-08-08 DIAGNOSIS — F1010 Alcohol abuse, uncomplicated: Secondary | ICD-10-CM | POA: Diagnosis present

## 2011-08-08 DIAGNOSIS — Z888 Allergy status to other drugs, medicaments and biological substances status: Secondary | ICD-10-CM

## 2011-08-08 DIAGNOSIS — L02419 Cutaneous abscess of limb, unspecified: Secondary | ICD-10-CM

## 2011-08-08 DIAGNOSIS — I10 Essential (primary) hypertension: Secondary | ICD-10-CM

## 2011-08-08 DIAGNOSIS — I2510 Atherosclerotic heart disease of native coronary artery without angina pectoris: Secondary | ICD-10-CM

## 2011-08-08 DIAGNOSIS — I2789 Other specified pulmonary heart diseases: Secondary | ICD-10-CM | POA: Diagnosis present

## 2011-08-08 DIAGNOSIS — F1910 Other psychoactive substance abuse, uncomplicated: Secondary | ICD-10-CM | POA: Diagnosis present

## 2011-08-08 DIAGNOSIS — Z8673 Personal history of transient ischemic attack (TIA), and cerebral infarction without residual deficits: Secondary | ICD-10-CM

## 2011-08-08 DIAGNOSIS — R079 Chest pain, unspecified: Secondary | ICD-10-CM

## 2011-08-08 DIAGNOSIS — IMO0001 Reserved for inherently not codable concepts without codable children: Secondary | ICD-10-CM

## 2011-08-08 DIAGNOSIS — F17200 Nicotine dependence, unspecified, uncomplicated: Secondary | ICD-10-CM

## 2011-08-08 HISTORY — DX: Pulmonary hypertension, unspecified: I27.20

## 2011-08-08 MED ORDER — HYDROMORPHONE HCL PF 1 MG/ML IJ SOLN
1.0000 mg | Freq: Once | INTRAMUSCULAR | Status: AC
Start: 2011-08-08 — End: 2011-08-08
  Administered 2011-08-08: 1 mg via INTRAVENOUS
  Filled 2011-08-08: qty 1

## 2011-08-08 MED ORDER — HYDROMORPHONE HCL PF 1 MG/ML IJ SOLN
1.0000 mg | Freq: Once | INTRAMUSCULAR | Status: AC
  Administered 2011-08-08: 1 mg via INTRAVENOUS
  Filled 2011-08-08: qty 1

## 2011-08-08 MED ORDER — ONDANSETRON HCL 4 MG/2ML IJ SOLN
4.0000 mg | Freq: Once | INTRAMUSCULAR | Status: AC
  Administered 2011-08-08: 4 mg via INTRAVENOUS
  Filled 2011-08-08: qty 2

## 2011-08-08 MED ORDER — CLINDAMYCIN PHOSPHATE 600 MG/50ML IV SOLN
600.0000 mg | Freq: Once | INTRAVENOUS | Status: AC
  Administered 2011-08-08: 600 mg via INTRAVENOUS
  Filled 2011-08-08: qty 50

## 2011-08-08 NOTE — ED Provider Notes (Signed)
History     CSN: [id number]  Arrival date & time 08/08/11  1940   First MD Initiated Contact with Patient 08/08/11 2000      Chief Complaint  Patient presents with  . Abscess    (Consider location/radiation/quality/duration/timing/severity/associated sxs/prior treatment) HPI Comments: Patient presents with L axilla abscess x 4 days. Area is painful, swollen, and red. Pain is dull and throbbing. Area started draining pus today. Pain is constant and does not radiate. No fever, N/V. No h/o DM. History of diastolic heart failure and tricuspid regurgitation.   Patient is a 46 y.o. female presenting with abscess. The history is provided by the patient.  Abscess  This is a new problem. The current episode started less than one week ago. The onset was gradual. The problem has been gradually worsening. The abscess is characterized by redness, draining and swelling. Pertinent negatives include no fever and no vomiting.    Past Medical History  Diagnosis Date  . Coronary artery disease   . Stroke   . Hypertension   . Tricuspid regurgitation   . Iron deficiency     hx of  . Polysubstance abuse   . Tobacco abuse   . Leukocytoclastic vasculitis     Past Surgical History  Procedure Date  . Ankle surgery     right    Family History  Problem Relation Age of Onset  . Coronary artery disease Mother     DM and breast cancer    History  Substance Use Topics  . Smoking status: Current Some Day Smoker  . Smokeless tobacco: Not on file  . Alcohol Use: Yes    OB History    Grav Para Term Preterm Abortions TAB SAB Ect Mult Living                  Review of Systems  Constitutional: Negative for fever.  Gastrointestinal: Negative for nausea and vomiting.  Musculoskeletal: Negative for myalgias.  All other systems reviewed and are negative.    Allergies  Ciprofloxacin  Home Medications   Current Outpatient Rx  Name Route Sig Dispense Refill  . PROVENTIL HFA IN Inhalation  Inhale into the lungs. 2 puffs every 4 hours as needed     . AMLODIPINE BESYLATE 5 MG PO TABS Oral Take 1 tablet (5 mg total) by mouth daily. 30 tablet 6  . ASPIRIN 81 MG PO TABS Oral Take 81 mg by mouth daily.      [name] CLONIDINE HCL 0.3 MG PO TABS Oral Take 1 tablet (0.3 mg total) by mouth 2 (two) times daily. 60 tablet 11  . FUROSEMIDE 20 MG PO TABS Oral Take 1 tablet (20 mg total) by mouth daily.    [name] LISINOPRIL 20 MG PO TABS Oral Take 1 tablet (20 mg total) by mouth daily. 30 tablet 11  . LORATADINE 10 MG PO TABS Oral Take 10 mg by mouth daily.      . NON FORMULARY  ferosul 325 mg  Take 1 tab bid     . POTASSIUM CHLORIDE 10 MEQ PO TBCR Oral Take 1 tablet (10 mEq total) by mouth daily.      BP 139/96  Pulse 87  Temp(Src) 98.1 F (36.7 C) (Oral)  Resp 18  SpO2 99%  Physical Exam  Nursing note and vitals reviewed. Constitutional: She appears well-developed and well-nourished.  HENT:  Head: Normocephalic and atraumatic.  Eyes: Conjunctivae are normal. Right eye exhibits no discharge. Left eye exhibits no discharge.  Neck:  Normal range of motion. Neck supple.  Cardiovascular: Normal rate, regular rhythm and intact distal pulses.   Murmur heard. Pulmonary/Chest: Effort normal and breath sounds normal.  Abdominal: Soft. There is no tenderness.  Neurological: She is alert.  Skin: Skin is warm.     Psychiatric: She has a normal mood and affect.    ED Course  Procedures (including critical care time)  Labs Reviewed  CBC - Abnormal; Notable for the following:    Hemoglobin 10.5 (*)    HCT 33.0 (*)    MCV 77.3 (*)    MCH 24.6 (*)    RDW 17.0 (*)    All other components within normal limits  DIFFERENTIAL - Abnormal; Notable for the following:    Neutrophils Relative 35 (*)    Neutro Abs 1.6 (*)    Monocytes Relative 17 (*)    All other components within normal limits  BASIC METABOLIC PANEL - Abnormal; Notable for the following:    Potassium 3.2 (*)    Glucose, Bld 119 (*)      GFR calc non Af Amer 70 (*)    GFR calc Af Amer 81 (*)    All other components within normal limits   No results found.   1. Axillary abscess     8:34 PM Patient seen and examined. IV abx and pain medication ordered. D/w with Dr. Bebe Shaggy who will see.    Vital signs reviewed and are as follows: Filed Vitals:   08/08/11 1949  BP: 139/96  Pulse: 87  Temp: 98.1 F (36.7 C)  Resp: 18   2:02 AM Patient was seen by Dr. Bebe Shaggy. Surgery was consulted. Given tricuspid history, large abscess -- recommends admission for IV antibiotics and drainage. Labs unconcerning.   [facility] to admit.   Re-exam unchanged, patient states pain is improved.    MDM  Admit for axillary abscess treatment        Renne Crigler, PA 08/09/11 0203  Renne Crigler, PA 08/10/11 [phone]

## 2011-08-08 NOTE — ED Notes (Signed)
Patient complaining of multiple boils underneath her left axilla. Patient alertx4, NAD.

## 2011-08-08 NOTE — ED Provider Notes (Signed)
Pt seen with PA Here for axillary abscess Also has h/o tricuspid regurg D/w surgery dr Donell Beers, recommends medicine admit, IV abx, cardiology consultation and elective drainage  Joya Gaskins, MD 08/08/11 2322

## 2011-08-09 ENCOUNTER — Inpatient Hospital Stay (HOSPITAL_COMMUNITY): Payer: Self-pay | Admitting: Anesthesiology

## 2011-08-09 ENCOUNTER — Encounter (HOSPITAL_COMMUNITY)
Admission: EM | Disposition: A | Discharge status: Home / Self Care | Payer: Self-pay | Source: Home / Self Care | Attending: Internal Medicine

## 2011-08-09 ENCOUNTER — Encounter (HOSPITAL_COMMUNITY): Payer: Self-pay | Admitting: Anesthesiology

## 2011-08-09 ENCOUNTER — Encounter (HOSPITAL_COMMUNITY): Payer: Self-pay | Admitting: Internal Medicine

## 2011-08-09 ENCOUNTER — Inpatient Hospital Stay (HOSPITAL_COMMUNITY): Payer: Self-pay

## 2011-08-09 DIAGNOSIS — F1910 Other psychoactive substance abuse, uncomplicated: Secondary | ICD-10-CM

## 2011-08-09 DIAGNOSIS — L0291 Cutaneous abscess, unspecified: Secondary | ICD-10-CM | POA: Diagnosis present

## 2011-08-09 DIAGNOSIS — L0390 Cellulitis, unspecified: Secondary | ICD-10-CM | POA: Diagnosis present

## 2011-08-09 DIAGNOSIS — Z01810 Encounter for preprocedural cardiovascular examination: Secondary | ICD-10-CM

## 2011-08-09 DIAGNOSIS — I079 Rheumatic tricuspid valve disease, unspecified: Secondary | ICD-10-CM

## 2011-08-09 DIAGNOSIS — I279 Pulmonary heart disease, unspecified: Secondary | ICD-10-CM

## 2011-08-09 DIAGNOSIS — I369 Nonrheumatic tricuspid valve disorder, unspecified: Secondary | ICD-10-CM

## 2011-08-09 DIAGNOSIS — IMO0002 Reserved for concepts with insufficient information to code with codable children: Secondary | ICD-10-CM

## 2011-08-09 HISTORY — PX: I&D EXTREMITY: SHX5045

## 2011-08-09 LAB — DIFFERENTIAL
Basophils Absolute: 0 10*3/uL (ref 0.0–0.1)
Basophils Relative: 1 % (ref 0–1)
Eosinophils Relative: 2 % (ref 0–5)
Monocytes Absolute: 0.8 10*3/uL (ref 0.1–1.0)

## 2011-08-09 LAB — BASIC METABOLIC PANEL
Calcium: 8.9 mg/dL (ref 8.4–10.5)
Creatinine, Ser: 0.95 mg/dL (ref 0.50–1.10)
GFR calc Af Amer: 81 mL/min — ABNORMAL LOW (ref >90)
GFR calc non Af Amer: 70 mL/min — ABNORMAL LOW (ref >90)
Sodium: 139 mEq/L (ref 135–145)

## 2011-08-09 LAB — COMPREHENSIVE METABOLIC PANEL
ALT: 7 U/L (ref 0–35)
Calcium: 9.2 mg/dL (ref 8.4–10.5)
Creatinine, Ser: 0.88 mg/dL (ref 0.50–1.10)
GFR calc Af Amer: 89 mL/min — ABNORMAL LOW (ref >90)
Glucose, Bld: 102 mg/dL — ABNORMAL HIGH (ref 70–99)
Sodium: 139 mEq/L (ref 135–145)
Total Protein: 7.9 g/dL (ref 6.0–8.3)

## 2011-08-09 LAB — CBC
HCT: 33 % — ABNORMAL LOW (ref 36.0–46.0)
HCT: 33 % — ABNORMAL LOW (ref 36.0–46.0)
Hemoglobin: 10.6 g/dL — ABNORMAL LOW (ref 12.0–15.0)
MCHC: 31.8 g/dL (ref 30.0–36.0)
MCHC: 32.1 g/dL (ref 30.0–36.0)
MCV: 77.3 fL — ABNORMAL LOW (ref 78.0–100.0)
Platelets: 235 10*3/uL (ref 150–400)
RBC: 4.25 MIL/uL (ref 3.87–5.11)
RDW: 17 % — ABNORMAL HIGH (ref 11.5–15.5)
WBC: 4.7 10*3/uL (ref 4.0–10.5)

## 2011-08-09 LAB — MAGNESIUM: Magnesium: 2 mg/dL (ref 1.5–2.5)

## 2011-08-09 LAB — RAPID URINE DRUG SCREEN, HOSP PERFORMED
Cocaine: POSITIVE — AB
Opiates: NOT DETECTED
Tetrahydrocannabinol: NOT DETECTED

## 2011-08-09 LAB — SURGICAL PCR SCREEN
MRSA, PCR: NEGATIVE
Staphylococcus aureus: POSITIVE — AB

## 2011-08-09 LAB — TSH: TSH: 0.951 u[IU]/mL (ref 0.350–4.500)

## 2011-08-09 SURGERY — IRRIGATION AND DEBRIDEMENT EXTREMITY
Anesthesia: General | Site: Axilla | Laterality: Left | Wound class: Dirty or Infected

## 2011-08-09 MED ORDER — FUROSEMIDE 20 MG PO TABS
20.0000 mg | ORAL_TABLET | Freq: Every day | ORAL | Status: DC
  Administered 2011-08-09 – 2011-08-11 (×3): 20 mg via ORAL
  Filled 2011-08-09 (×3): qty 1

## 2011-08-09 MED ORDER — LORATADINE 10 MG PO TABS
10.0000 mg | ORAL_TABLET | Freq: Every day | ORAL | Status: DC
Start: 2011-08-09 — End: 2011-08-11
  Administered 2011-08-09 – 2011-08-11 (×3): 10 mg via ORAL
  Filled 2011-08-09 (×3): qty 1

## 2011-08-09 MED ORDER — ALUM & MAG HYDROXIDE-SIMETH 200-200-20 MG/5ML PO SUSP: 30.0000 mL | Freq: Four times a day (QID) | ORAL | Status: DC | PRN

## 2011-08-09 MED ORDER — FENTANYL CITRATE 0.05 MG/ML IJ SOLN
INTRAMUSCULAR | Status: DC | PRN
  Administered 2011-08-09: 100 ug via INTRAVENOUS
  Administered 2011-08-09: 25 ug via INTRAVENOUS

## 2011-08-09 MED ORDER — ADULT MULTIVITAMIN W/MINERALS CH
1.0000 | ORAL_TABLET | Freq: Every day | ORAL | Status: DC
  Administered 2011-08-09 – 2011-08-11 (×3): 1 via ORAL
  Filled 2011-08-09 (×3): qty 1

## 2011-08-09 MED ORDER — ASPIRIN EC 81 MG PO TBEC
81.0000 mg | DELAYED_RELEASE_TABLET | Freq: Every day | ORAL | Status: DC
  Administered 2011-08-09 – 2011-08-11 (×3): 81 mg via ORAL
  Filled 2011-08-09 (×3): qty 1

## 2011-08-09 MED ORDER — LISINOPRIL 20 MG PO TABS
20.0000 mg | ORAL_TABLET | Freq: Every day | ORAL | Status: DC
  Administered 2011-08-09 – 2011-08-11 (×3): 20 mg via ORAL
  Filled 2011-08-09 (×3): qty 1

## 2011-08-09 MED ORDER — ONDANSETRON HCL 4 MG PO TABS: 4.0000 mg | ORAL_TABLET | Freq: Four times a day (QID) | ORAL | Status: DC | PRN

## 2011-08-09 MED ORDER — SODIUM CHLORIDE 0.9 % IJ SOLN: 3.0000 mL | Freq: Two times a day (BID) | INTRAMUSCULAR | Status: DC

## 2011-08-09 MED ORDER — HYDROMORPHONE HCL PF 1 MG/ML IJ SOLN
1.0000 mg | INTRAMUSCULAR | Status: DC | PRN
  Administered 2011-08-09 – 2011-08-11 (×5): 1 mg via INTRAVENOUS
  Filled 2011-08-09 (×5): qty 1

## 2011-08-09 MED ORDER — PROPOFOL 10 MG/ML IV BOLUS
INTRAVENOUS | Status: DC | PRN
  Administered 2011-08-09: 150 mg via INTRAVENOUS

## 2011-08-09 MED ORDER — 0.9 % SODIUM CHLORIDE (POUR BTL) OPTIME
TOPICAL | Status: DC | PRN
  Administered 2011-08-09: 1000 mL

## 2011-08-09 MED ORDER — LACTATED RINGERS IV SOLN
INTRAVENOUS | Status: DC | PRN
  Administered 2011-08-09: 14:00:00 via INTRAVENOUS

## 2011-08-09 MED ORDER — SODIUM CHLORIDE 0.9 % IJ SOLN: 3.0000 mL | INTRAMUSCULAR | Status: DC | PRN

## 2011-08-09 MED ORDER — DOCUSATE SODIUM 100 MG PO CAPS
100.0000 mg | ORAL_CAPSULE | Freq: Two times a day (BID) | ORAL | Status: DC
  Administered 2011-08-09 – 2011-08-11 (×5): 100 mg via ORAL
  Filled 2011-08-09 (×6): qty 1

## 2011-08-09 MED ORDER — PIPERACILLIN-TAZOBACTAM 3.375 G IVPB
3.3750 g | Freq: Three times a day (TID) | INTRAVENOUS | Status: DC
  Administered 2011-08-09 – 2011-08-10 (×4): 3.375 g via INTRAVENOUS
  Filled 2011-08-09 (×9): qty 50

## 2011-08-09 MED ORDER — ENOXAPARIN SODIUM 40 MG/0.4ML ~~LOC~~ SOLN
40.0000 mg | Freq: Every day | SUBCUTANEOUS | Status: DC
  Administered 2011-08-09 – 2011-08-11 (×3): 40 mg via SUBCUTANEOUS
  Filled 2011-08-09 (×3): qty 0.4

## 2011-08-09 MED ORDER — CLONIDINE HCL 0.3 MG PO TABS
0.3000 mg | ORAL_TABLET | Freq: Two times a day (BID) | ORAL | Status: DC
  Administered 2011-08-09 – 2011-08-11 (×5): 0.3 mg via ORAL
  Filled 2011-08-09 (×6): qty 1

## 2011-08-09 MED ORDER — DROPERIDOL 2.5 MG/ML IJ SOLN: 0.6250 mg | INTRAMUSCULAR | Status: DC | PRN

## 2011-08-09 MED ORDER — CHLORHEXIDINE GLUCONATE CLOTH 2 % EX PADS
6.0000 | MEDICATED_PAD | Freq: Every day | CUTANEOUS | Status: DC
  Administered 2011-08-09 – 2011-08-11 (×3): 6 via TOPICAL

## 2011-08-09 MED ORDER — ACETAMINOPHEN 650 MG RE SUPP: 650.0000 mg | Freq: Four times a day (QID) | RECTAL | Status: DC | PRN

## 2011-08-09 MED ORDER — PIPERACILLIN-TAZOBACTAM 3.375 G IVPB 30 MIN
3.3750 g | Freq: Three times a day (TID) | INTRAVENOUS | Status: AC
  Administered 2011-08-09 (×2): 3.375 g via INTRAVENOUS
  Filled 2011-08-09 (×3): qty 50

## 2011-08-09 MED ORDER — GUAIFENESIN-DM 100-10 MG/5ML PO SYRP: 5.0000 mL | ORAL_SOLUTION | ORAL | Status: DC | PRN

## 2011-08-09 MED ORDER — LORAZEPAM 0.5 MG PO TABS: 1.0000 mg | ORAL_TABLET | Freq: Four times a day (QID) | ORAL | Status: DC | PRN

## 2011-08-09 MED ORDER — THIAMINE HCL 100 MG/ML IJ SOLN
100.0000 mg | Freq: Every day | INTRAMUSCULAR | Status: DC
  Filled 2011-08-09 (×3): qty 1

## 2011-08-09 MED ORDER — ALBUTEROL SULFATE HFA 108 (90 BASE) MCG/ACT IN AERS: 1.0000 | INHALATION_SPRAY | RESPIRATORY_TRACT | Status: DC | PRN

## 2011-08-09 MED ORDER — ACETAMINOPHEN 325 MG PO TABS: 650.0000 mg | ORAL_TABLET | Freq: Four times a day (QID) | ORAL | Status: DC | PRN

## 2011-08-09 MED ORDER — POTASSIUM CHLORIDE CRYS ER 20 MEQ PO TBCR
40.0000 meq | EXTENDED_RELEASE_TABLET | Freq: Once | ORAL | Status: AC
  Administered 2011-08-09: 40 meq via ORAL
  Filled 2011-08-09: qty 2

## 2011-08-09 MED ORDER — FERROUS SULFATE 325 (65 FE) MG PO TABS
325.0000 mg | ORAL_TABLET | Freq: Two times a day (BID) | ORAL | Status: DC
  Administered 2011-08-09 – 2011-08-11 (×5): 325 mg via ORAL
  Filled 2011-08-09 (×8): qty 1

## 2011-08-09 MED ORDER — SODIUM CHLORIDE 0.9 % IV SOLN
250.0000 mL | INTRAVENOUS | Status: DC | PRN
  Administered 2011-08-09: 250 mL via INTRAVENOUS

## 2011-08-09 MED ORDER — PNEUMOCOCCAL VAC POLYVALENT 25 MCG/0.5ML IJ INJ
0.5000 mL | INJECTION | INTRAMUSCULAR | Status: AC
  Administered 2011-08-10: 0.5 mL via INTRAMUSCULAR
  Filled 2011-08-09: qty 0.5

## 2011-08-09 MED ORDER — MUPIROCIN 2 % EX OINT
1.0000 "application " | TOPICAL_OINTMENT | Freq: Two times a day (BID) | CUTANEOUS | Status: DC
  Administered 2011-08-09 – 2011-08-11 (×4): 1 via NASAL
  Filled 2011-08-09: qty 22

## 2011-08-09 MED ORDER — HYDROMORPHONE HCL PF 1 MG/ML IJ SOLN
0.2500 mg | INTRAMUSCULAR | Status: DC | PRN
  Administered 2011-08-09 (×3): 0.5 mg via INTRAVENOUS

## 2011-08-09 MED ORDER — VITAMIN B-1 100 MG PO TABS
100.0000 mg | ORAL_TABLET | Freq: Every day | ORAL | Status: DC
  Administered 2011-08-09 – 2011-08-11 (×3): 100 mg via ORAL
  Filled 2011-08-09 (×3): qty 1

## 2011-08-09 MED ORDER — LORAZEPAM 0.5 MG PO TABS: 0.0000 mg | ORAL_TABLET | Freq: Four times a day (QID) | ORAL | Status: AC

## 2011-08-09 MED ORDER — HYDROCODONE-ACETAMINOPHEN 5-325 MG PO TABS
1.0000 | ORAL_TABLET | ORAL | Status: DC | PRN
  Administered 2011-08-09 – 2011-08-10 (×2): 2 via ORAL
  Administered 2011-08-10: 1 via ORAL
  Administered 2011-08-10: 2 via ORAL
  Filled 2011-08-09 (×5): qty 2

## 2011-08-09 MED ORDER — FOLIC ACID 1 MG PO TABS
1.0000 mg | ORAL_TABLET | Freq: Every day | ORAL | Status: DC
  Administered 2011-08-09 – 2011-08-11 (×3): 1 mg via ORAL
  Filled 2011-08-09 (×3): qty 1

## 2011-08-09 MED ORDER — AMLODIPINE BESYLATE 5 MG PO TABS
5.0000 mg | ORAL_TABLET | Freq: Every day | ORAL | Status: DC
  Administered 2011-08-09 – 2011-08-11 (×3): 5 mg via ORAL
  Filled 2011-08-09 (×3): qty 1

## 2011-08-09 MED ORDER — SODIUM CHLORIDE 0.9 % IJ SOLN
3.0000 mL | Freq: Two times a day (BID) | INTRAMUSCULAR | Status: DC
  Administered 2011-08-09 – 2011-08-10 (×2): 3 mL via INTRAVENOUS
  Administered 2011-08-11: 11:00:00 via INTRAVENOUS

## 2011-08-09 MED ORDER — VANCOMYCIN HCL 1000 MG IV SOLR
750.0000 mg | Freq: Two times a day (BID) | INTRAVENOUS | Status: DC
  Administered 2011-08-09 – 2011-08-11 (×5): 750 mg via INTRAVENOUS
  Filled 2011-08-09 (×6): qty 750

## 2011-08-09 MED ORDER — ONDANSETRON HCL 4 MG/2ML IJ SOLN
INTRAMUSCULAR | Status: DC | PRN
  Administered 2011-08-09: 4 mg via INTRAVENOUS

## 2011-08-09 MED ORDER — LORAZEPAM 0.5 MG PO TABS: 0.0000 mg | ORAL_TABLET | Freq: Two times a day (BID) | ORAL | Status: DC

## 2011-08-09 MED ORDER — POTASSIUM CHLORIDE CRYS ER 20 MEQ PO TBCR
20.0000 meq | EXTENDED_RELEASE_TABLET | Freq: Every day | ORAL | Status: DC
  Administered 2011-08-09 – 2011-08-11 (×3): 20 meq via ORAL
  Filled 2011-08-09 (×3): qty 1

## 2011-08-09 MED ORDER — MIDAZOLAM HCL 5 MG/5ML IJ SOLN
INTRAMUSCULAR | Status: DC | PRN
  Administered 2011-08-09: 2 mg via INTRAVENOUS

## 2011-08-09 MED ORDER — LORAZEPAM 2 MG/ML IJ SOLN: 1.0000 mg | Freq: Four times a day (QID) | INTRAMUSCULAR | Status: DC | PRN

## 2011-08-09 MED ORDER — HYDROMORPHONE HCL PF 1 MG/ML IJ SOLN
INTRAMUSCULAR | Status: AC
  Filled 2011-08-09: qty 1

## 2011-08-09 MED ORDER — ONDANSETRON HCL 4 MG/2ML IJ SOLN: 4.0000 mg | Freq: Four times a day (QID) | INTRAMUSCULAR | Status: DC | PRN

## 2011-08-09 MED ORDER — ALBUTEROL SULFATE (5 MG/ML) 0.5% IN NEBU: 2.5000 mg | INHALATION_SOLUTION | RESPIRATORY_TRACT | Status: DC | PRN

## 2011-08-09 SURGICAL SUPPLY — 25 items
BANDAGE GAUZE ELAST BULKY 4 IN (GAUZE/BANDAGES/DRESSINGS) ×1 IMPLANT
CANISTER SUCTION 2500CC (MISCELLANEOUS) ×2 IMPLANT
CLOTH BEACON ORANGE TIMEOUT ST (SAFETY) ×2 IMPLANT
COVER SURGICAL LIGHT HANDLE (MISCELLANEOUS) ×2 IMPLANT
DRAPE EXTREMITY T 121X128X90 (DRAPE) ×2 IMPLANT
DRAPE PROXIMA HALF (DRAPES) ×2 IMPLANT
DRAPE UTILITY 15X26 W/TAPE STR (DRAPE) ×4 IMPLANT
DRSG PAD ABDOMINAL 8X10 ST (GAUZE/BANDAGES/DRESSINGS) ×1 IMPLANT
ELECT REM PT RETURN 9FT ADLT (ELECTROSURGICAL) ×2
ELECTRODE REM PT RTRN 9FT ADLT (ELECTROSURGICAL) ×1 IMPLANT
GLOVE BIO SURGEON STRL SZ7.5 (GLOVE) ×2 IMPLANT
GOWN STRL NON-REIN LRG LVL3 (GOWN DISPOSABLE) ×3 IMPLANT
KIT BASIN OR (CUSTOM PROCEDURE TRAY) ×2 IMPLANT
KIT ROOM TURNOVER OR (KITS) ×2 IMPLANT
MARKER SKIN DUAL TIP RULER LAB (MISCELLANEOUS) ×2 IMPLANT
NS IRRIG 1000ML POUR BTL (IV SOLUTION) ×2 IMPLANT
PACK GENERAL/GYN (CUSTOM PROCEDURE TRAY) ×2 IMPLANT
PAD ARMBOARD 7.5X6 YLW CONV (MISCELLANEOUS) ×4 IMPLANT
SPONGE GAUZE 4X4 12PLY (GAUZE/BANDAGES/DRESSINGS) ×2 IMPLANT
STOCKINETTE IMPERVIOUS 9X36 MD (GAUZE/BANDAGES/DRESSINGS) ×1 IMPLANT
TAPE CLOTH SURG 4X10 WHT LF (GAUZE/BANDAGES/DRESSINGS) ×2 IMPLANT
TOWEL OR 17X24 6PK STRL BLUE (TOWEL DISPOSABLE) ×2 IMPLANT
TOWEL OR 17X26 10 PK STRL BLUE (TOWEL DISPOSABLE) ×2 IMPLANT
UNDERPAD 30X30 INCONTINENT (UNDERPADS AND DIAPERS) ×1 IMPLANT
WATER STERILE IRR 1000ML POUR (IV SOLUTION) ×1 IMPLANT

## 2011-08-09 NOTE — Anesthesia Procedure Notes (Signed)
Procedure Name: LMA Insertion Date/Time: 08/09/2011 2:30 PM Performed by: Jerilee Hoh Pre-anesthesia Checklist: Patient identified, Emergency Drugs available, Suction available and Patient being monitored Patient Re-evaluated:Patient Re-evaluated prior to inductionOxygen Delivery Method: Circle system utilized Preoxygenation: Pre-oxygenation with 100% oxygen Intubation Type: IV induction LMA: LMA inserted LMA Size: 4.0 Tube type: Oral Number of attempts: 1 Placement Confirmation: positive ETCO2 and breath sounds checked- equal and bilateral Tube secured with: Tape Dental Injury: Teeth and Oropharynx as per pre-operative assessment

## 2011-08-09 NOTE — Anesthesia Postprocedure Evaluation (Signed)
Anesthesia Post Note  Patient: Morgan Bean  Procedure(s) Performed: Procedure(s) (LRB): IRRIGATION AND DEBRIDEMENT EXTREMITY (Left)  Anesthesia type: general  Patient location: PACU  Post pain: Pain level controlled  Post assessment: Patient's Cardiovascular Status Stable  Last Vitals:  Filed Vitals:   08/09/11 1552  BP:   Pulse: 57  Temp:   Resp: 13    Post vital signs: Reviewed and stable  Level of consciousness: sedated  Complications: No apparent anesthesia complications

## 2011-08-09 NOTE — Progress Notes (Signed)
Assessment/Plan  46yo with history of tricuspid valve regurgitation and diastolic CHF as well as CAD here for I&D. She has history of Moderate Pulmonary hypertension due to Leukocytoclastic vasculitis due to cocaine abuse. She continues to use cocaine.   Present on Admission:  .Abscess and cellulitis - Will cover with broad spectrum antibiotics vanc and zosyn for now, had been seen by cardiology- low risk for I&D- planned or today?  Marland KitchenHypertension - continue home medications, avoid betablockers given recent cocaine  .Chronic diastolic heart failure - continue lasix.   History of TR - and pulmonary hypertension followed by [facility] cardiology,   Polysubstance abuse - given hx of ETOH use will put on CIWA for now. counseled against use of cocaine that is chronic.   Prophylaxis: Lovenox, Protonix    Morgan Bean

## 2011-08-09 NOTE — Progress Notes (Signed)
  Subjective: Complains of left axillary pain  Objective: Vital signs in last 24 hours: Temp:  [97.6 F (36.4 C)-98.1 F (36.7 C)] 97.9 F (36.6 C) (05/19 0438) Pulse Rate:  [60-87] 62  (05/19 0800) Resp:  [14-18] 16  (05/19 0438) BP: (116-139)/(70-96) 122/80 mmHg (05/19 0800) SpO2:  [99 %-100 %] 99 % (05/19 0438) Weight:  [166 lb 10.7 oz (75.6 kg)] 166 lb 10.7 oz (75.6 kg) (05/19 0438) Last BM Date: 08/08/11  Intake/Output from previous day: 05/18 0701 - 05/19 0700 In: 150 [IV Piggyback:150] Out: -  Intake/Output this shift: Total I/O In: 50 [IV Piggyback:50] Out: 150 [Urine:150]  Extremities: left axillary abscess  Lab Results:   Basename 08/09/11 0917 08/08/11 2357  WBC 4.4 4.7  HGB 10.6* 10.5*  HCT 33.0* 33.0*  PLT 235 235   BMET  Basename 08/09/11 0917 08/08/11 2357  NA 139 139  K 3.9 3.2*  CL 106 105  CO2 23 24  GLUCOSE 102* 119*  BUN 8 8  CREATININE 0.88 0.95  CALCIUM 9.2 8.9   PT/INR No results found for this basename: LABPROT:2,INR:2 in the last 72 hours ABG No results found for this basename: PHART:2,PCO2:2,PO2:2,HCO3:2 in the last 72 hours  Studies/Results: Dg Chest 2 View  08/09/2011  -*RADIOLOGY REPORT*  Clinical Data: Hypertension, asthma, left axillary abscess.  CHEST - 2 VIEW  Comparison: 11/30/2009  Findings: Cardiomegaly.  Central vascular congestion.  Mild interstitial prominence.  Mild retrocardiac opacity.  No pneumothorax.  No definite pleural effusion.  No acute osseous finding.  IMPRESSION: Cardiomegaly with mild edema pattern.  Mild retrocardiac opacity; atelectasis versus infiltrate.  Original Report Authenticated By: Waneta Martins, M.D.    Anti-infectives: Anti-infectives     Start     Dose/Rate Route Frequency Ordered Stop   08/09/11 0600   piperacillin-tazobactam (ZOSYN) IVPB 3.375 g        3.375 g 100 mL/hr over 30 Minutes Intravenous 3 times per day 08/09/11 0438     08/09/11 0600   vancomycin (VANCOCIN) 750 mg in  sodium chloride 0.9 % 150 mL IVPB        750 mg 150 mL/hr over 60 Minutes Intravenous Every 12 hours 08/09/11 0449     08/08/11 2030   clindamycin (CLEOCIN) IVPB 600 mg        600 mg 100 mL/hr over 30 Minutes Intravenous  Once 08/08/11 2020 08/08/11 2114          Assessment/Plan: s/p * No surgery found * plan to i+d in OR Continue abx  LOS: 1 day    TOTH III,Seleny Allbright S 08/09/2011

## 2011-08-09 NOTE — Transfer of Care (Signed)
Immediate Anesthesia Transfer of Care Note  Patient: Morgan Bean  Procedure(s) Performed: Procedure(s) (LRB): IRRIGATION AND DEBRIDEMENT EXTREMITY (N/A)  Patient Location: PACU  Anesthesia Type: General  Level of Consciousness: awake, alert , oriented and patient cooperative  Airway & Oxygen Therapy: Patient Spontanous Breathing and Patient connected to face mask oxygen  Post-op Assessment: Report given to PACU RN, Post -op Vital signs reviewed and stable and Patient moving all extremities  Post vital signs: Reviewed and stable  Complications: No apparent anesthesia complications

## 2011-08-09 NOTE — ED Notes (Signed)
Called to give report.  Will call back in 15 minutes.

## 2011-08-09 NOTE — Anesthesia Preprocedure Evaluation (Addendum)
Anesthesia Evaluation  Patient identified by MRN, date of birth, ID band Patient awake    Reviewed: Allergy & Precautions, H&P , NPO status , Patient's Chart, lab work & pertinent test results  History of Anesthesia Complications Negative for: history of anesthetic complications  Airway Mallampati: II TM Distance: >3 FB Neck ROM: Full    Dental  (+) Poor Dentition and Dental Advisory Given   Pulmonary asthma ,    Pulmonary exam normal       Cardiovascular hypertension, Pt. on medications - CAD     Neuro/Psych CVA, No Residual Symptoms    GI/Hepatic negative GI ROS, Neg liver ROS, (+)     substance abuse   ,   Endo/Other  negative endocrine ROS  Renal/GU negative Renal ROS     Musculoskeletal   Abdominal   Peds  Hematology   Anesthesia Other Findings   Reproductive/Obstetrics                          Anesthesia Physical Anesthesia Plan  ASA: III  Anesthesia Plan: General   Post-op Pain Management:    Induction: Intravenous  Airway Management Planned: LMA and Mask  Additional Equipment:   Intra-op Plan:   Post-operative Plan: Extubation in OR  Informed Consent: I have reviewed the patients History and Physical, chart, labs and discussed the procedure including the risks, benefits and alternatives for the proposed anesthesia with the patient or authorized representative who has indicated his/her understanding and acceptance.   Dental advisory given  Plan Discussed with: CRNA, Anesthesiologist and Surgeon  Anesthesia Plan Comments:        Anesthesia Quick Evaluation

## 2011-08-09 NOTE — Progress Notes (Signed)
  Echocardiogram 2D Echocardiogram has been performed.  Vishruth Seoane, Real Cons 08/09/2011, 10:37 AM

## 2011-08-09 NOTE — ED Notes (Signed)
Pt waiting for adm. No complaints. Left axilla red and swollen. Denies any pain at present. States pain med helped a lot.

## 2011-08-09 NOTE — Progress Notes (Signed)
Received pt from PACU RN. Pt placed in bed. Telemetry Monitor applied. Pt alert and oriented Pain rated at a 3 on scale of 1 to 10. Lt Axilla area dressing clean dry and intact.  Marisa Cyphers RN

## 2011-08-09 NOTE — H&P (Addendum)
PCP:  [facility]: [facility] cardiology  Chief Complaint:   Arm swelling  HPI: Morgan Bean is a 46 y.o. female   has a past medical history of Coronary artery disease; Stroke; Hypertension; Tricuspid regurgitation; Iron deficiency; Polysubstance abuse; Tobacco abuse; and Leukocytoclastic vasculitis.   Presented with  3-4 day of Left arm/axila pain and swelling she denies any fever. Yesterday it started to drain. No prior history of this.  Patient denies any chest pain or SOB. She does admits to doing cocaine last time was yesterday. She also drinks at least 2-3 times a week. Denies any IV drug use.  She has history of moderate pulmonary hypertension and moderate to possibly severe tricuspid regurgitation per echo in 9/11 she have had CATH in 10/12 showing nonobstructive mild coronary artery disease.  Patient was seen today by surgery who are planning on I&D if she is cleared by cardiology.     Review of Systems:    Pertinent positives include: arm pain  Constitutional:  No weight loss, night sweats, Fevers, chills, fatigue, weight loss  HEENT:  No headaches, Difficulty swallowing,Tooth/dental problems,Sore throat,  No sneezing, itching, ear ache, nasal congestion, post nasal drip,  Cardio-vascular:  No chest pain, Orthopnea, PND, anasarca, dizziness, palpitations.no Bilateral lower extremity swelling  GI:  No heartburn, indigestion, abdominal pain, nausea, vomiting, diarrhea, change in bowel habits, loss of appetite, melena, blood in stool, hematemesis Resp:  no shortness of breath at rest. No dyspnea on exertion, No excess mucus, no productive cough, No non-productive cough, No coughing up of blood.No change in color of mucus.No wheezing. Skin:  no rash or lesions. No jaundice GU:  no dysuria, change in color of urine, no urgency or frequency. No straining to urinate.  No flank pain.  Musculoskeletal:  No joint pain or no joint swelling. No decreased range of  motion. No back pain.  Psych:  No change in mood or affect. No depression or anxiety. No memory loss.  Neuro: no localizing neurological complaints, no tingling, no weakness, no double vision, no gait abnormality, no slurred speech, no confusion  Otherwise ROS are negative except for above, 10 systems were reviewed  Past Medical History: Past Medical History  Diagnosis Date  . Coronary artery disease   . Stroke   . Hypertension   . Tricuspid regurgitation   . Iron deficiency     hx of  . Polysubstance abuse   . Tobacco abuse   . Leukocytoclastic vasculitis    Past Surgical History  Procedure Date  . Ankle surgery     right     Medications: Prior to Admission medications   Medication Sig Start Date End Date Taking? Authorizing Provider  Albuterol Sulfate (PROVENTIL HFA IN) Inhale into the lungs. 2 puffs every 4 hours as needed    Yes Historical Provider, MD  amLODipine (NORVASC) 5 MG tablet Take 1 tablet (5 mg total) by mouth daily. 02/04/11 02/04/12 Yes Laurey Morale, MD  aspirin 81 MG tablet Take 81 mg by mouth daily.     Yes Historical Provider, MD  cloNIDine (CATAPRES) 0.3 MG tablet Take 1 tablet (0.3 mg total) by mouth 2 (two) times daily. 02/24/11 02/24/12 Yes Beatrice Lecher, PA  furosemide (LASIX) 20 MG tablet Take 1 tablet (20 mg total) by mouth daily. 01/06/11  Yes Laurey Morale, MD  lisinopril (PRINIVIL,ZESTRIL) 20 MG tablet Take 1 tablet (20 mg total) by mouth daily. 12/15/10  Yes Laurey Morale, MD  loratadine (CLARITIN) 10  MG tablet Take 10 mg by mouth daily.     Yes Historical Provider, MD  NON FORMULARY ferosul 325 mg  Take 1 tab bid    Yes Historical Provider, MD  potassium chloride (KLOR-CON 10) 10 MEQ CR tablet Take 1 tablet (10 mEq total) by mouth daily. 01/06/11  Yes Laurey Morale, MD    Allergies:   Allergies  Allergen Reactions  . Ciprofloxacin Itching    Social History:  Ambulatory  independently  Lives at  Home,    reports that she has  been smoking.  She does not have any smokeless tobacco history on file. She reports that she drinks alcohol. She reports that she uses illicit drugs ("Crack" cocaine).   Family History: family history includes Coronary artery disease in her mother and Diabetes in her mother.    Physical Exam: Patient Vitals for the past 24 hrs:  BP Temp Temp src Pulse Resp SpO2  08/09/11 0038 116/80 mmHg - - - 18  99 %  08/08/11 2300 120/84 mmHg - - - 14  100 %  08/08/11 1949 139/96 mmHg 98.1 F (36.7 C) Oral 87  18  99 %    1. General:  in No Acute distress 2. Psychological: Alert and  Oriented 3. Head/ENT:   Moist  Mucous Membranes                          Head Non traumatic, neck supple                           Poor Dentition 4. SKIN: normal Skin turgor,  Skin clean Dry  no rash, Left axilla significant for large swelling appears to be abscess that is draining purulent fluid. Very tender.  5. Heart: Regular rate and rhythm no Murmur, Rub or gallop 6. Lungs:  no wheezes occasional  crackles   7. Abdomen: Soft, non-tender, Non distended 8. Lower extremities: no clubbing, cyanosis, or edema 9. Neurologically Grossly intact, moving all 4 extremities equally 10. MSK: Normal range of motion  body mass index is unknown because there is no height or weight on file.   Labs on Admission:   [facility] 08/08/11 2357  NA 139  K 3.2*  CL 105  CO2 24  GLUCOSE 119*  BUN 8  CREATININE 0.95  CALCIUM 8.9  MG --  PHOS --   No results found for this basename: AST:2,ALT:2,ALKPHOS:2,BILITOT:2,PROT:2,ALBUMIN:2 in the last 72 hours No results found for this basename: LIPASE:2,AMYLASE:2 in the last 72 hours  Basename 08/08/11 2357  WBC 4.7  NEUTROABS 1.6*  HGB 10.5*  HCT 33.0*  MCV 77.3*  PLT 235   No results found for this basename: CKTOTAL:3,CKMB:3,CKMBINDEX:3,TROPONINI:3 in the last 72 hours No results found for this basename: TSH,T4TOTAL,FREET3,T3FREE,THYROIDAB in the last 72 hours No results  found for this basename: VITAMINB12:2,FOLATE:2,FERRITIN:2,TIBC:2,IRON:2,RETICCTPCT:2 in the last 72 hours Lab Results  Component Value Date   HGBA1C  Value: 5.7 (NOTE)                                                                       According to the ADA Clinical Practice Recommendations for 2011, when HbA1c is  used as a screening test:   >=6.5%   Diagnostic of Diabetes Mellitus           (if abnormal result  is confirmed)  5.7-6.4%   Increased risk of developing Diabetes Mellitus  References:Diagnosis and Classification of Diabetes Mellitus,Diabetes Care,2011,34(Suppl 1):S62-S69 and Standards of Medical Care in         Diabetes - 2011,Diabetes Care,2011,34  (Suppl 1):S11-S61.* 12/02/2009    The CrCl is unknown because both a height and weight (above a minimum accepted value) are required for this calculation. ABG    Component Value Date/Time   PHART 7.389 01/16/2011 0906   HCO3 20.1 01/16/2011 0909   TCO2 21 01/16/2011 0909   ACIDBASEDEF 5.0* 01/16/2011 0909   O2SAT 73.0 01/16/2011 0909     No results found for this basename: DDIMER      Cultures:    Component Value Date/Time   SDES BLOOD RIGHT HAND 11/30/2009 1829   SPECREQUEST BOTTLES DRAWN AEROBIC AND ANAEROBIC 5CC IN BOTH BOTTLES 11/30/2009 1829   CULT NO GROWTH 5 DAYS 11/30/2009 1829   REPTSTATUS 12/07/2009 FINAL 11/30/2009 1829       Radiological Exams on Admission: No results found.  Assessment/Plan  46yo with history of tricuspid valve regurgitation and diastolic CHF as well as CAD here for I&D. She has history of Moderate Pulmonary hypertension due to Leukocytoclastic vasculitis due to cocaine abuse. She continues to use cocaine.    Present on Admission:  .Abscess and cellulitis - Will cover with broad spectrum antibiotics vanc and zosyn for now. .Hypertension - continue home medications, avoid betablockers given recent cocaine .Chronic diastolic heart failure - continue lasix. History of TR -  and pulmonary  hypertension followed by [facility]ology, will contact [facility] cardiology to help with management and cardiac clearance. At this point appears stable from cardiac stand point.  Polysubstance abuse - given hx of ETOH use will put on CIWA for now. counseled against use of cocaine that is chronic.  Prophylaxis:  Lovenox, Protonix  CODE STATUS: FULL CODE  I have spent a total of 65 min on this admission  Likisha Alles 08/09/2011, 2:25 AM

## 2011-08-09 NOTE — ED Notes (Signed)
Patient transported to X-ray 

## 2011-08-09 NOTE — ED Notes (Signed)
Admitting MD here to see pt.

## 2011-08-09 NOTE — Op Note (Signed)
08/08/2011 - 08/09/2011  2:50 PM  PATIENT:  Morgan Bean  46 y.o. female  PRE-OPERATIVE DIAGNOSIS:  /  POST-OPERATIVE DIAGNOSIS:  * No post-op diagnosis entered *  PROCEDURE:  Procedure(s) (LRB): IRRIGATION AND DEBRIDEMENT EXTREMITY (N/A)  SURGEON:  Surgeon(s) and Role:    * Robyne Askew, MD - Primary  PHYSICIAN ASSISTANT:   ASSISTANTS: none   ANESTHESIA:   general  EBL:  Total I/O In: 550 [I.V.:500; IV Piggyback:50] Out: 150 [Urine:150]  BLOOD ADMINISTERED:none  DRAINS: none   LOCAL MEDICATIONS USED:  NONE  SPECIMEN:  No Specimen  DISPOSITION OF SPECIMEN:  N/A  COUNTS:  YES  TOURNIQUET:  * No tourniquets in log *  DICTATION: .Dragon Dictation After informed consent was obtained patient brought to the operating room placed in the supine position on the operating room table. After adequate induction of general anesthesia the patient's left axilla was prepped with Betadine and draped in usual sterile manner. The patient has a large abscess in the left axilla that has some opening to the skin. Skin opening was probed with a hemostat and then the entire cavity was opened sharply with the electrocautery. Cultures were taken. Hemostasis was achieved using the Bovie electrocautery. There was a smaller open area posterior in the axilla which was also probed with a hemostat and then hemostasis was also achieved using the Bovie electrocautery. The wounds were then packed with moistened Kerlix gauze and sterile dressings were applied. The patient tolerated procedure well. At the end of the case needle sponge and instrument counts were correct. The patient was then awakened and taken to recovery in stable condition.  PLAN OF CARE: Admit to inpatient   PATIENT DISPOSITION:  PACU - hemodynamically stable.   Delay start of Pharmacological VTE agent (>24hrs) due to surgical blood loss or risk of bleeding: yes

## 2011-08-09 NOTE — Preoperative (Signed)
Beta Blockers   Reason not to administer Beta Blockers:Not Applicable 

## 2011-08-09 NOTE — Consult Note (Signed)
CARDIOLOGY CONSULT NOTE  Patient ID: Morgan Bean MRN: [id number] DOB/AGE: 09-27-1965 46 y.o.  Admit date: 08/08/2011 Primary Physician  Primary Cardiologist  Marca Ancona MD Chief Complaint  Preoperative clearance  HPI:  The patient presented to the ER with left axillary pain.  She is found to have abscesses under her left arm and might need to have surgery.  She has a history of tricuspid regurgitation, elevated pulmonary pressures and nonobstructive CAD.   The patient denies any new symptoms such as chest discomfort, neck or arm discomfort. There has been no new shortness of breath, PND or orthopnea. There have been no reported palpitations, presyncope or syncope.  Past Medical History  Diagnosis Date  . Coronary artery disease     Lexiscan myoview (10/12) with significant ST depression upon Lexiscan injection but no ischemia or infarction on perfusion images. Left heart cath (10/12): 30% mid LAD, 30% ostial D1, 50% ostial D2.    . Stroke   . Hypertension   . Tricuspid regurgitation   . Iron deficiency     hx of  . Polysubstance abuse   . Tobacco abuse   . Leukocytoclastic vasculitis   . Pulmonary HTN     Echo (9/11) with EF 65%, mild LVH, mild AI, mild MR, moderate to possibly severe TR with PA systolic pressure 52 mmHg. Echo (10/12): severe LV hypertrophy, EF 60-65%, mild MR, mild AI, moderate to severe tricuspid regurgitation, PA systolic pressure 38 mmHg.    . Asthma   . CHF (congestive heart failure)     Past Surgical History  Procedure Date  . Ankle surgery     right  . Cardiac catheterization     Allergies  Allergen Reactions  . Ciprofloxacin Itching    Current Outpatient Prescriptions on File Prior to Encounter  Medication Sig Dispense Refill  . Albuterol Sulfate (PROVENTIL HFA IN) Inhale into the lungs. 2 puffs every 4 hours as needed       . amLODipine (NORVASC) 5 MG tablet Take 1 tablet (5 mg total) by mouth daily.  30 tablet  6  . aspirin 81 MG tablet  Take 81 mg by mouth daily.        . cloNIDine (CATAPRES) 0.3 MG tablet Take 1 tablet (0.3 mg total) by mouth 2 (two) times daily.  60 tablet  11  . furosemide (LASIX) 20 MG tablet Take 1 tablet (20 mg total) by mouth daily.       Ferosul 325 mg bid    . lisinopril (PRINIVIL,ZESTRIL) 20 MG tablet Take 1 tablet (20 mg total) by mouth daily.  30 tablet  11  . loratadine (CLARITIN) 10 MG tablet Take 10 mg by mouth daily.        . NON FORMULARY ferosul 325 mg  Take 1 tab bid       . potassium chloride (KLOR-CON 10) 10 MEQ CR tablet Take 1 tablet (10 mEq total) by mouth daily.                  Family History  Problem Relation Age of Onset  . Coronary artery disease Mother     DM and breast cancer  . Diabetes Mother     History   Social History  . Marital Status: Single    Spouse Name: N/A    Number of Children: N/A  . Years of Education: N/A    Social History Main Topics  . Smoking status: Current Some Day Smoker  . Smokeless tobacco: Not  on file  . Alcohol Use: Yes     pint of wine 2-3 times a week  . Drug Use: Yes    Special: "Crack" cocaine  . Sexually Active: Not on file    Social History Narrative  . No narrative on file     ROS:  Constipation.  Otherwise as stated in the HPI and negative for all other systems.  Physical Exam: Blood pressure 138/92, pulse 64, temperature 97.9 F (36.6 C), temperature source Oral, resp. rate 16, height 5\' 6"  (1.676 m), weight 166 lb 10.7 oz (75.6 kg), last menstrual period 07/15/2011, SpO2 99.00%.  GENERAL:  Well appearing HEENT:  Pupils equal round and reactive, fundi not visualized, oral mucosa unremarkable NECK:  Jugular venous distention 10 cm at forty five degrees, waveform within normal limits, carotid upstroke brisk and symmetric, no bruits, no thyromegaly LYMPHATICS:  No cervical, inguinal adenopathy LUNGS:  Clear to auscultation bilaterally BACK:  No CVA tenderness CHEST:  Unremarkable HEART:  PMI not displaced or  sustained,S1 and S2 within normal limits, no S3, no S4, no clicks, no rubs, no murmurs ABD:  Flat, positive bowel sounds normal in frequency in pitch, no bruits, no rebound, no guarding, no midline pulsatile mass, no hepatomegaly, no splenomegaly EXT:  2 plus pulses throughout, no edema, no cyanosis no clubbing, draining abscess left axilla SKIN:  No rashes no nodules NEURO:  Cranial nerves II through XII grossly intact, motor grossly intact throughout PSYCH:  Cognitively intact, oriented to person place and time   Labs: Lab Results  Component Value Date   BUN 8 08/08/2011   Lab Results  Component Value Date   CREATININE 0.95 08/08/2011   Lab Results  Component Value Date   NA 139 08/08/2011   K 3.2* 08/08/2011   CL 105 08/08/2011   CO2 24 08/08/2011   Lab Results  Component Value Date   CKTOTAL 19 11/30/2009   CKMB 0.4 11/30/2009   TROPONINI  Value: 0.01        NO INDICATION OF MYOCARDIAL INJURY. 11/30/2009   Lab Results  Component Value Date   WBC 4.7 08/08/2011   HGB 10.5* 08/08/2011   HCT 33.0* 08/08/2011   MCV 77.3* 08/08/2011   PLT 235 08/08/2011    EKG:  Pending  ASSESSMENT AND PLAN:   1)  Preop Examination:  The patient has no high risk findings or symptoms.  She has a high functional level.  She is going for a procedure that is low risk from a cardiac standpoint.   Therefore, based on ACC/AHA guidelines, the patient would be at acceptable risk for the planned procedure without further cardiovascular testing.  2)  TR/Pulmonary HTN:  Continue current therapy.  SignedRollene Rotunda 08/09/2011, 5:39 AM

## 2011-08-09 NOTE — Consult Note (Signed)
Reason for Consult:Left axillary abscess Referring Physician: Bebe Shaggy, MD  Morgan Bean is an 46 y.o. female.  HPI: Pt is 46 year old female with around 3 days of severe left axillary pain.  It has gradually been worsening.  She has large "swelling" in her armpit.  It became unbearable today.  She has not had abscesses before, but has had a daughter who had to have an abscess drained in her ear last year.  She did experience some drainage today from the area.  She has additional areas in her posterior axilla, but these have been minimal compared to the area in front.    Past Medical History  Diagnosis Date  . Coronary artery disease   . Stroke   . Hypertension   . Tricuspid regurgitation   . Iron deficiency     hx of  . Polysubstance abuse   . Tobacco abuse   . Leukocytoclastic vasculitis     Past Surgical History  Procedure Date  . Ankle surgery     right    Family History  Problem Relation Age of Onset  . Coronary artery disease Mother     DM and breast cancer    Social History:  reports that she has been smoking.  She does not have any smokeless tobacco history on file. She reports that she drinks alcohol. She reports that she uses illicit drugs.  Allergies:  Allergies  Allergen Reactions  . Ciprofloxacin Itching     MedicationsLong-Term  Prescriptions Show Facility-Administered Medications    Albuterol Sulfate (PROVENTIL HFA IN)   amLODipine (NORVASC) 5 MG tablet   aspirin 81 MG tablet   cloNIDine (CATAPRES) 0.3 MG tablet   furosemide (LASIX) 20 MG tablet   lisinopril (PRINIVIL,ZESTRIL) 20 MG tablet   loratadine (CLARITIN) 10 MG tablet   NON FORMULARY   potassium chloride (KLOR-CON 10) 10 MEQ CR tablet     Results for orders placed during the hospital encounter of 08/08/11 (from the past 48 hour(s))  CBC     Status: Abnormal   Collection Time   08/08/11 11:57 PM      Component Value Range Comment   WBC 4.7  4.0 - 10.5 (K/uL)    RBC 4.27  3.87 - 5.11  (MIL/uL)    Hemoglobin 10.5 (*) 12.0 - 15.0 (g/dL)    HCT 16.1 (*) 09.6 - 46.0 (%)    MCV 77.3 (*) 78.0 - 100.0 (fL)    MCH 24.6 (*) 26.0 - 34.0 (pg)    MCHC 31.8  30.0 - 36.0 (g/dL)    RDW 04.5 (*) 40.9 - 15.5 (%)    Platelets 235  150 - 400 (K/uL)   DIFFERENTIAL     Status: Abnormal   Collection Time   08/08/11 11:57 PM      Component Value Range Comment   Neutrophils Relative 35 (*) 43 - 77 (%)    Neutro Abs 1.6 (*) 1.7 - 7.7 (K/uL)    Lymphocytes Relative 46  12 - 46 (%)    Lymphs Abs 2.1  0.7 - 4.0 (K/uL)    Monocytes Relative 17 (*) 3 - 12 (%)    Monocytes Absolute 0.8  0.1 - 1.0 (K/uL)    Eosinophils Relative 2  0 - 5 (%)    Eosinophils Absolute 0.1  0.0 - 0.7 (K/uL)    Basophils Relative 1  0 - 1 (%)    Basophils Absolute 0.0  0.0 - 0.1 (K/uL)     No results  found.  Review of Systems  Constitutional: Negative.   HENT: Negative.   Eyes: Negative.   Respiratory: Negative.   Cardiovascular: Negative.   Gastrointestinal: Negative.   Genitourinary: Negative.   Musculoskeletal:       Left axillary and arm pain.  Skin:       Skin infection  Neurological: Negative.   Endo/Heme/Allergies: Negative.   Psychiatric/Behavioral: Negative.    Blood pressure 116/80, pulse 87, temperature 98.1 F (36.7 C), temperature source Oral, resp. rate 18, SpO2 99.00%. Physical Exam  Constitutional: She is oriented to person, place, and time. She appears well-developed and well-nourished. She appears distressed (looks very uncomfortable.).  HENT:  Head: Normocephalic and atraumatic.  Eyes: Conjunctivae are normal. Pupils are equal, round, and reactive to light. No scleral icterus.  Neck: Normal range of motion. Neck supple. No tracheal deviation present. No thyromegaly present.  Cardiovascular: Normal rate, regular rhythm, normal heart sounds and intact distal pulses.  Exam reveals no gallop and no friction rub.   No murmur heard. Respiratory: Effort normal and breath sounds normal. No  respiratory distress. She has no wheezes. She has no rales. She exhibits no tenderness.         3x4 cm fluctuant area   GI: Soft. Bowel sounds are normal. She exhibits no distension and no mass. There is no tenderness. There is no rebound and no guarding.  Musculoskeletal: She exhibits tenderness (left anterior axillary abscess).  Lymphadenopathy:    She has no cervical adenopathy.  Neurological: She is alert and oriented to person, place, and time.  Skin: Skin is warm and dry. No rash noted. She is not diaphoretic. No erythema. No pallor.       Large axillary abscess in anterior axillary line.  Hidradenitis in remainder of axilla.    Psychiatric: She has a normal mood and affect. Her behavior is normal. Judgment and thought content normal.    Assessment/Plan: Left axillary abscess and hidradenitis. IV antibiotics Medicine evaluation/admission. May need cardiac clearance. Abscess of reasonable size with shooting pains in her arm.  Unlikely would be able to adequately anesthetize with local anesthetic.   Will need I&D when cleared for surgery.    Morgan Bean 08/09/2011, 1:13 AM

## 2011-08-09 NOTE — Progress Notes (Signed)
ANTIBIOTIC CONSULT NOTE - INITIAL  Pharmacy Consult for Vancomycin Indication: Cellulitis / abscess  Allergies  Allergen Reactions  . Ciprofloxacin Itching    Patient Measurements: Height: 5\' 6"  (167.6 cm) Weight: 166 lb 10.7 oz (75.6 kg) (scale A) IBW/kg (Calculated) : 59.3   Vital Signs: Temp: 97.9 F (36.6 C) (05/19 0438) Temp src: Oral (05/19 0438) BP: 138/92 mmHg (05/19 0438) Pulse Rate: 64  (05/19 0438) Intake/Output from previous day:   Intake/Output from this shift:    Labs:  Basename 08/08/11 2357  WBC 4.7  HGB 10.5*  PLT 235  LABCREA --  CREATININE 0.95   Estimated Creatinine Clearance: 75.2 ml/min (by C-G formula based on Cr of 0.95). No results found for this basename: VANCOTROUGH:2,VANCOPEAK:2,VANCORANDOM:2,GENTTROUGH:2,GENTPEAK:2,GENTRANDOM:2,TOBRATROUGH:2,TOBRAPEAK:2,TOBRARND:2,AMIKACINPEAK:2,AMIKACINTROU:2,AMIKACIN:2, in the last 72 hours   Microbiology: No results found for this or any previous visit (from the past 720 hour(s)).  Medical History: Past Medical History  Diagnosis Date  . Coronary artery disease     Lexiscan myoview (10/12) with significant ST depression upon Lexiscan injection but no ischemia or infarction on perfusion images. Left heart cath (10/12): 30% mid LAD, 30% ostial D1, 50% ostial D2.    . Stroke   . Hypertension   . Tricuspid regurgitation   . Iron deficiency     hx of  . Polysubstance abuse   . Tobacco abuse   . Leukocytoclastic vasculitis   . Pulmonary HTN     Echo (9/11) with EF 65%, mild LVH, mild AI, mild MR, moderate to possibly severe TR with PA systolic pressure 52 mmHg. Echo (10/12): severe LV hypertrophy, EF 60-65%, mild MR, mild AI, moderate to severe tricuspid regurgitation, PA systolic pressure 38 mmHg.      Medications:  Scheduled:    . amLODipine  5 mg Oral Daily  . aspirin EC  81 mg Oral Daily  . clindamycin (CLEOCIN) IV  600 mg Intravenous Once  . cloNIDine  0.3 mg Oral BID  . docusate sodium   100 mg Oral BID  . enoxaparin  40 mg Subcutaneous Daily  . folic acid  1 mg Oral Daily  . furosemide  20 mg Oral Daily  .  HYDROmorphone (DILAUDID) injection  1 mg Intravenous Once  .  HYDROmorphone (DILAUDID) injection  1 mg Intravenous Once  . lisinopril  20 mg Oral Daily  . loratadine  10 mg Oral Daily  . LORazepam  0-4 mg Oral Q6H   Followed by  . LORazepam  0-4 mg Oral Q12H  . mulitivitamin with minerals  1 tablet Oral Daily  . ondansetron  4 mg Intravenous Once  . piperacillin-tazobactam  3.375 g Intravenous Q8H  . potassium chloride  20 mEq Oral Daily  . potassium chloride  40 mEq Oral Once  . sodium chloride  3 mL Intravenous Q12H  . sodium chloride  3 mL Intravenous Q12H  . thiamine  100 mg Oral Daily   Or  . thiamine  100 mg Intravenous Daily   Assessment: 46 yo female admitted with abscess and cellulitis. Pharmacy consulted to manage vancomycin. Patient to receive Zosyn.   Goal of Therapy:  Vancomycin trough level 10-15 mcg/ml  Plan:  1. Vancomycin 750mg  IV Q12H.   Emeline Gins 08/09/2011,4:46 AM

## 2011-08-10 ENCOUNTER — Encounter (HOSPITAL_COMMUNITY): Payer: Self-pay | Admitting: General Surgery

## 2011-08-10 DIAGNOSIS — F1910 Other psychoactive substance abuse, uncomplicated: Secondary | ICD-10-CM

## 2011-08-10 DIAGNOSIS — IMO0002 Reserved for concepts with insufficient information to code with codable children: Secondary | ICD-10-CM

## 2011-08-10 DIAGNOSIS — I079 Rheumatic tricuspid valve disease, unspecified: Secondary | ICD-10-CM

## 2011-08-10 LAB — BASIC METABOLIC PANEL
CO2: 25 mEq/L (ref 19–32)
Chloride: 105 mEq/L (ref 96–112)
GFR calc Af Amer: 70 mL/min — ABNORMAL LOW (ref >90)
Potassium: 3.8 mEq/L (ref 3.5–5.1)
Sodium: 138 mEq/L (ref 135–145)

## 2011-08-10 LAB — CBC
Platelets: 233 10*3/uL (ref 150–400)
RBC: 3.97 MIL/uL (ref 3.87–5.11)
RDW: 17.2 % — ABNORMAL HIGH (ref 11.5–15.5)
WBC: 4.4 10*3/uL (ref 4.0–10.5)

## 2011-08-10 NOTE — Care Management Note (Unsigned)
    Page 1 of 1   08/10/2011     10:12:22 AM   CARE MANAGEMENT NOTE 08/10/2011  Patient:  Morgan Bean,Morgan Bean   Account Number:  [id number]  Date Initiated:  08/10/2011  Documentation initiated by:  Tera Mater  Subjective/Objective Assessment:   46yo female admitted with left arm swelling.  Noted to have absessses and required I & D.  Pt. lives with her parents.     Action/Plan:   Assistance needed for medications.  Will inquire if pt. is eligible for the Indigent fund.   Anticipated DC Date:  08/12/2011   Anticipated DC Plan:  HOME/SELF CARE      DC Planning Services  CM consult  Medication Assistance      Choice offered to / List presented to:             Status of service:  In process, will continue to follow Medicare Important Message given?  NO (If response is "NO", the following Medicare IM given date fields will be blank) Date Medicare IM given:   Date Additional Medicare IM given:    Discharge Disposition:    Per UR Regulation:  Reviewed for med. necessity/level of care/duration of stay  If discussed at Long Length of Stay Meetings, dates discussed:    Comments:  08/10/11 1010 Pt. is eligible for the indigent fund if she should need this for her discharge medications.  In addition, the indigent fund would cover an entire antibiotic prescription. NCM will follow. Tera Mater, RN, BSN NCM [phone]

## 2011-08-10 NOTE — Progress Notes (Signed)
Subjective: Patient very jittery No new c/o Pain controlled  Objective: Vital signs in last 24 hours: Filed Vitals:   08/09/11 2338 08/10/11 0200 08/10/11 0420 08/10/11 1008  BP:  120/84 124/72 128/84  Pulse: 63 60 60   Temp:  98.4 F (36.9 C) 98.1 F (36.7 C)   TempSrc:  Oral Oral   Resp:  18 20   Height:      Weight:  77.5 kg (170 lb 13.7 oz) 77.5 kg (170 lb 13.7 oz)   SpO2:  99% 99%    Weight change: 1.9 kg (4 lb 3 oz)  Intake/Output Summary (Last 24 hours) at 08/10/11 1249 Last data filed at 08/10/11 1238  Gross per 24 hour  Intake 1778.5 ml  Output   1100 ml  Net  678.5 ml    Physical Exam: General: Awake, Oriented, No acute distress. HEENT: EOMI. Neck: Supple CV: S1 and S2 Lungs: Clear to ascultation bilaterally,  left axilla wound is clean and packed. No purulent drainage seen. Some surrounding induration, but minimal erythema. Abdomen: Soft, Nontender, Nondistended, +bowel sounds. Ext: Good pulses. Trace edema.   Lab Results:  Basename 08/10/11 0500 08/09/11 0917  NA 138 139  K 3.8 3.9  CL 105 106  CO2 25 23  GLUCOSE 100* 102*  BUN 9 8  CREATININE 1.07 0.88  CALCIUM 8.9 9.2  MG -- 2.0  PHOS -- 3.3    Basename 08/09/11 0917  AST 13  ALT 7  ALKPHOS 72  BILITOT 0.1*  PROT 7.9  ALBUMIN 2.7*   No results found for this basename: LIPASE:2,AMYLASE:2 in the last 72 hours  Basename 08/10/11 0500 08/09/11 0917 08/08/11 2357  WBC 4.4 4.4 --  NEUTROABS -- -- 1.6*  HGB 9.7* 10.6* --  HCT 31.1* 33.0* --  MCV 78.3 77.6* --  PLT 233 235 --   No results found for this basename: CKTOTAL:3,CKMB:3,CKMBINDEX:3,TROPONINI:3 in the last 72 hours No components found with this basename: POCBNP:3 No results found for this basename: DDIMER:2 in the last 72 hours No results found for this basename: HGBA1C:2 in the last 72 hours No results found for this basename: CHOL:2,HDL:2,LDLCALC:2,TRIG:2,CHOLHDL:2,LDLDIRECT:2 in the last 72 hours  Basename 08/09/11 0917    TSH 0.951  T4TOTAL --  T3FREE --  THYROIDAB --   No results found for this basename: VITAMINB12:2,FOLATE:2,FERRITIN:2,TIBC:2,IRON:2,RETICCTPCT:2 in the last 72 hours  Micro Results: Recent Results (from the past 240 hour(s))  SURGICAL PCR SCREEN     Status: Abnormal   Collection Time   08/09/11 11:47 AM      Component Value Range Status Comment   MRSA, PCR NEGATIVE  NEGATIVE  Final    Staphylococcus aureus POSITIVE (*) NEGATIVE  Final   CULTURE, ROUTINE-ABSCESS     Status: Normal (Preliminary result)   Collection Time   08/09/11  3:02 PM      Component Value Range Status Comment   Specimen Description ABSCESS LEFT AXILLA   Final    Special Requests NONE   Final    Gram Stain PENDING   Incomplete    Culture Culture reincubated for better growth   Final    Report Status PENDING   Incomplete     Studies/Results: Dg Chest 2 View  08/09/2011  -*RADIOLOGY REPORT*  Clinical Data: Hypertension, asthma, left axillary abscess.  CHEST - 2 VIEW  Comparison: 11/30/2009  Findings: Cardiomegaly.  Central vascular congestion.  Mild interstitial prominence.  Mild retrocardiac opacity.  No pneumothorax.  No definite pleural effusion.  No  acute osseous finding.  IMPRESSION: Cardiomegaly with mild edema pattern.  Mild retrocardiac opacity; atelectasis versus infiltrate.  Original Report Authenticated By: Waneta Martins, M.D.    Medications: I have reviewed the patient's current medications. Scheduled Meds:   . amLODipine  5 mg Oral Daily  . aspirin EC  81 mg Oral Daily  . Chlorhexidine Gluconate Cloth  6 each Topical Daily  . cloNIDine  0.3 mg Oral BID  . docusate sodium  100 mg Oral BID  . enoxaparin  40 mg Subcutaneous Daily  . ferrous sulfate  325 mg Oral BID WC  . folic acid  1 mg Oral Daily  . furosemide  20 mg Oral Daily  . HYDROmorphone      . lisinopril  20 mg Oral Daily  . loratadine  10 mg Oral Daily  . LORazepam  0-4 mg Oral Q6H   Followed by  . LORazepam  0-4 mg Oral Q12H   . mulitivitamin with minerals  1 tablet Oral Daily  . mupirocin ointment  1 application Nasal BID  . piperacillin-tazobactam  3.375 g Intravenous Q8H  . piperacillin-tazobactam (ZOSYN)  IV  3.375 g Intravenous Q8H  . pneumococcal 23 valent vaccine  0.5 mL Intramuscular Tomorrow-1000  . potassium chloride  20 mEq Oral Daily  . sodium chloride  3 mL Intravenous Q12H  . thiamine  100 mg Oral Daily   Or  . thiamine  100 mg Intravenous Daily  . vancomycin  750 mg Intravenous Q12H   Continuous Infusions:  PRN Meds:.sodium chloride, acetaminophen, acetaminophen, albuterol, albuterol, alum & mag hydroxide-simeth, guaiFENesin-dextromethorphan, HYDROcodone-acetaminophen, HYDROmorphone (DILAUDID) injection, LORazepam, LORazepam, ondansetron (ZOFRAN) IV, ondansetron, sodium chloride, DISCONTD: 0.9 % irrigation (POUR BTL), DISCONTD: droperidol, DISCONTD:  HYDROmorphone (DILAUDID) injection  Assessment/Plan: Abscess- s/p I&d, on abx, await culture, hope change to PO tomm and D/C?, appreciate surgery's input   Hypertension- stable   Chronic diastolic heart failure- stable     LOS: 2 days  Andray Assefa, DO 08/10/2011, 12:49 PM

## 2011-08-10 NOTE — Progress Notes (Signed)
Patient ID: Morgan Bean, female   DOB: 02-22-1966, 46 y.o.   MRN: [id number] 1 Day Post-Op  Subjective: Pt with less pain today.  Objective: Vital signs in last 24 hours: Temp:  [97 F (36.1 C)-98.4 F (36.9 C)] 98.1 F (36.7 C) (05/20 0420) Pulse Rate:  [55-67] 60  (05/20 0420) Resp:  [12-23] 20  (05/20 0420) BP: (102-144)/(66-99) 128/84 mmHg (05/20 1008) SpO2:  [92 %-100 %] 99 % (05/20 0420) Weight:  [170 lb 13.7 oz (77.5 kg)] 170 lb 13.7 oz (77.5 kg) (05/20 0420) Last BM Date: 08/08/11  Intake/Output from previous day: 05/19 0701 - 05/20 0700 In: 1486.5 [P.O.:460; I.V.:626.5; IV Piggyback:400] Out: 575 [Urine:575] Intake/Output this shift: Total I/O In: 222 [P.O.:222] Out: 400 [Urine:400]  PE: Chest: left axilla wound is clean and packed.  No purulent drainage seen.  Some surrounding induration, but minimal erythema.  Lab Results:   Basename 08/10/11 0500 08/09/11 0917  WBC 4.4 4.4  HGB 9.7* 10.6*  HCT 31.1* 33.0*  PLT 233 235   BMET  Basename 08/10/11 0500 08/09/11 0917  NA 138 139  K 3.8 3.9  CL 105 106  CO2 25 23  GLUCOSE 100* 102*  BUN 9 8  CREATININE 1.07 0.88  CALCIUM 8.9 9.2   PT/INR No results found for this basename: LABPROT:2,INR:2 in the last 72 hours CMP     Component Value Date/Time   NA 138 08/10/2011 0500   K 3.8 08/10/2011 0500   CL 105 08/10/2011 0500   CO2 25 08/10/2011 0500   GLUCOSE 100* 08/10/2011 0500   BUN 9 08/10/2011 0500   CREATININE 1.07 08/10/2011 0500   CALCIUM 8.9 08/10/2011 0500   PROT 7.9 08/09/2011 0917   ALBUMIN 2.7* 08/09/2011 0917   AST 13 08/09/2011 0917   ALT 7 08/09/2011 0917   ALKPHOS 72 08/09/2011 0917   BILITOT 0.1* 08/09/2011 0917   GFRNONAA 60* 08/10/2011 0500   GFRAA 70* 08/10/2011 0500   Lipase     Component Value Date/Time   LIPASE 20 11/30/2009 1209       Studies/Results: Dg Chest 2 View  08/09/2011  -*RADIOLOGY REPORT*  Clinical Data: Hypertension, asthma, left axillary abscess.  CHEST - 2 VIEW   Comparison: 11/30/2009  Findings: Cardiomegaly.  Central vascular congestion.  Mild interstitial prominence.  Mild retrocardiac opacity.  No pneumothorax.  No definite pleural effusion.  No acute osseous finding.  IMPRESSION: Cardiomegaly with mild edema pattern.  Mild retrocardiac opacity; atelectasis versus infiltrate.  Original Report Authenticated By: Waneta Martins, M.D.    Anti-infectives: Anti-infectives     Start     Dose/Rate Route Frequency Ordered Stop   08/09/11 2200  piperacillin-tazobactam (ZOSYN) IVPB 3.375 g       3.375 g 12.5 mL/hr over 240 Minutes Intravenous 3 times per day 08/09/11 1337     08/09/11 0600  piperacillin-tazobactam (ZOSYN) IVPB 3.375 g       3.375 g 100 mL/hr over 30 Minutes Intravenous 3 times per day 08/09/11 0438 08/09/11 1432   08/09/11 0600   vancomycin (VANCOCIN) 750 mg in sodium chloride 0.9 % 150 mL IVPB        750 mg 150 mL/hr over 60 Minutes Intravenous Every 12 hours 08/09/11 0449     08/08/11 2030   clindamycin (CLEOCIN) IVPB 600 mg        600 mg 100 mL/hr over 30 Minutes Intravenous  Once 08/08/11 2020 08/08/11 2114  Assessment/Plan  1. Left axillary abscess, s/p I&D  Plan: 1. Cont abx therapy 2. Start NS WD dressings BID to wound today.   LOS: 2 days    Naiyana Barbian E 08/10/2011

## 2011-08-11 DIAGNOSIS — IMO0002 Reserved for concepts with insufficient information to code with codable children: Secondary | ICD-10-CM

## 2011-08-11 DIAGNOSIS — F1910 Other psychoactive substance abuse, uncomplicated: Secondary | ICD-10-CM

## 2011-08-11 DIAGNOSIS — I079 Rheumatic tricuspid valve disease, unspecified: Secondary | ICD-10-CM

## 2011-08-11 MED ORDER — HYDROCODONE-ACETAMINOPHEN 5-325 MG PO TABS: 1.0000 | ORAL_TABLET | ORAL | Status: AC | PRN

## 2011-08-11 MED ORDER — SULFAMETHOXAZOLE-TMP DS 800-160 MG PO TABS
1.0000 | ORAL_TABLET | Freq: Two times a day (BID) | ORAL | Status: DC
  Administered 2011-08-11: 1 via ORAL
  Filled 2011-08-11 (×2): qty 1

## 2011-08-11 MED ORDER — SULFAMETHOXAZOLE-TMP DS 800-160 MG PO TABS: 1.0000 | ORAL_TABLET | Freq: Two times a day (BID) | ORAL | Status: AC

## 2011-08-11 NOTE — Discharge Instructions (Signed)
Normal saline wet to dry dressing changes to be done to wound twice a day. Remove packing prior to showering, replace packing once shower complete.

## 2011-08-11 NOTE — Progress Notes (Signed)
Patient ID: Morgan Bean, female   DOB: 1965/05/18, 46 y.o.   MRN: [id number] 2 Days Post-Op  Subjective: Pt feels ok.  No complaints.  Packing not done correctly.  Objective: Vital signs in last 24 hours: Temp:  [97.3 F (36.3 C)-97.6 F (36.4 C)] 97.6 F (36.4 C) (05/20 2234) Pulse Rate:  [57-72] 64  (05/21 0600) Resp:  [18-20] 18  (05/20 2234) BP: (104-148)/(70-98) 148/96 mmHg (05/21 0600) SpO2:  [99 %-100 %] 100 % (05/20 2234) Last BM Date: 08/08/11  Intake/Output from previous day: 05/20 0701 - 05/21 0700 In: 1819.7 [P.O.:1124; I.V.:245.7; IV Piggyback:450] Out: 1125 [Urine:1125] Intake/Output this shift: Total I/O In: 240 [P.O.:240] Out: 475 [Urine:475]  PE: Chest: left axillary wound is clean.  Incorrect dressing removed and correctly redone by myself.  I taught the patient how to do her dressing change correctly so she can show her daughter.    Lab Results:   Basename 08/10/11 0500 08/09/11 0917  WBC 4.4 4.4  HGB 9.7* 10.6*  HCT 31.1* 33.0*  PLT 233 235   BMET  Basename 08/10/11 0500 08/09/11 0917  NA 138 139  K 3.8 3.9  CL 105 106  CO2 25 23  GLUCOSE 100* 102*  BUN 9 8  CREATININE 1.07 0.88  CALCIUM 8.9 9.2   PT/INR No results found for this basename: LABPROT:2,INR:2 in the last 72 hours CMP     Component Value Date/Time   NA 138 08/10/2011 0500   K 3.8 08/10/2011 0500   CL 105 08/10/2011 0500   CO2 25 08/10/2011 0500   GLUCOSE 100* 08/10/2011 0500   BUN 9 08/10/2011 0500   CREATININE 1.07 08/10/2011 0500   CALCIUM 8.9 08/10/2011 0500   PROT 7.9 08/09/2011 0917   ALBUMIN 2.7* 08/09/2011 0917   AST 13 08/09/2011 0917   ALT 7 08/09/2011 0917   ALKPHOS 72 08/09/2011 0917   BILITOT 0.1* 08/09/2011 0917   GFRNONAA 60* 08/10/2011 0500   GFRAA 70* 08/10/2011 0500   Lipase     Component Value Date/Time   LIPASE 20 11/30/2009 1209       Studies/Results: No results found.  Anti-infectives: Anti-infectives     Start     Dose/Rate Route Frequency Ordered  Stop   08/09/11 2200  piperacillin-tazobactam (ZOSYN) IVPB 3.375 g       3.375 g 12.5 mL/hr over 240 Minutes Intravenous 3 times per day 08/09/11 1337     08/09/11 0600  piperacillin-tazobactam (ZOSYN) IVPB 3.375 g       3.375 g 100 mL/hr over 30 Minutes Intravenous 3 times per day 08/09/11 0438 08/09/11 1432   08/09/11 0600   vancomycin (VANCOCIN) 750 mg in sodium chloride 0.9 % 150 mL IVPB        750 mg 150 mL/hr over 60 Minutes Intravenous Every 12 hours 08/09/11 0449     08/08/11 2030   clindamycin (CLEOCIN) IVPB 600 mg        600 mg 100 mL/hr over 30 Minutes Intravenous  Once 08/08/11 2020 08/08/11 2114           Assessment/Plan  1. Left axillary abscess, s/p I&D  Plan: 1. Patient stable for discharge home from our standpoint. 2. rec about 7 days of po abx therapy 3. Her daughter will do her dressing changes at home 4. Follow up in clinic in 1-2 weeks for wound check.   LOS: 3 days    Clemente Dewey E 08/11/2011

## 2011-08-11 NOTE — ED Provider Notes (Signed)
Medical screening examination/treatment/procedure(s) were conducted as a shared visit with non-physician practitioner(s) and myself.  I personally evaluated the patient during the encounter   Joya Gaskins, MD 08/11/11 [id number]

## 2011-08-11 NOTE — Discharge Summary (Signed)
Discharge Summary  Morgan Bean MR#: [id number]  DOB:[birth year]  Date of Admission: 08/08/2011 Date of Discharge: 08/11/2011  Patient's PCP: No primary provider on file.  Attending Physician:Alonzo Owczarzak  Consults: Treatment Team:  Rollene Rotunda, MD   Discharge Diagnoses: Principal Problem:  *Abscess and cellulitis- s/p I&D Active Problems:  Hypertension  Chronic diastolic heart failure   Brief Admitting History and Physical Morgan Bean is a 46 y.o. female  has a past medical history of Coronary artery disease; Stroke; Hypertension; Tricuspid regurgitation; Iron deficiency; Polysubstance abuse; Tobacco abuse; and Leukocytoclastic vasculitis.  Presented with  3-4 day of Left arm/axila pain and swelling she denies any fever. Yesterday it started to drain. No prior history of this.  Patient denies any chest pain or SOB. She does admits to doing cocaine last time was yesterday. She also drinks at least 2-3 times a week. Denies any IV drug use.  She has history of moderate pulmonary hypertension and moderate to possibly severe tricuspid regurgitation per echo in 9/11 she have had CATH in 10/12 showing nonobstructive mild coronary artery disease.  Patient was seen today by surgery who are planning on I&D if she is cleared by cardiology.      Discharge Medications Medication List  As of 08/11/2011 11:29 AM   STOP taking these medications         CALCIUM PO         TAKE these medications         amLODipine 5 MG tablet   Commonly known as: NORVASC   Take 1 tablet (5 mg total) by mouth daily.      aspirin 81 MG tablet   Take 81 mg by mouth daily.      cloNIDine 0.3 MG tablet   Commonly known as: CATAPRES   Take 1 tablet (0.3 mg total) by mouth 2 (two) times daily.      HYDROcodone-acetaminophen 5-325 MG per tablet   Commonly known as: NORCO   Take 1 tablet by mouth every 4 (four) hours as needed.      KLOR-CON 10 10 MEQ CR tablet   Generic drug: potassium chloride     Take 1 tablet (10 mEq total) by mouth daily.      LASIX 20 MG tablet   Generic drug: furosemide   Take 1 tablet (20 mg total) by mouth daily.      lisinopril 20 MG tablet   Commonly known as: PRINIVIL,ZESTRIL   Take 1 tablet (20 mg total) by mouth daily.      loratadine 10 MG tablet   Commonly known as: CLARITIN   Take 10 mg by mouth daily.      NON FORMULARY   ferosul 325 mg  Take 1 tab bid      PROVENTIL HFA IN   Inhale into the lungs. 2 puffs every 4 hours as needed      sulfamethoxazole-trimethoprim 800-160 MG per tablet   Commonly known as: BACTRIM DS   Take 1 tablet by mouth every 12 (twelve) hours.            Hospital Course: Abscess and cellulitis- s/p I&D grew staph aureus, placed on abx for 1 week, shown how to pack by surgery and patient felt comfortable doing at home, will provide the neceassary supplies, pain medications PRN  .Hypertension- home medications  .Chronic diastolic heart failure - continue to follow up with cardiology   Day of Discharge BP 126/101  Pulse 64  Temp(Src) 97.6 F (36.4 C) (Oral)  Resp 18  Ht 5\' 6"  (1.676 m)  Wt 77.5 kg (170 lb 13.7 oz)  BMI 27.58 kg/m2  SpO2 100%  LMP 07/15/2011 A+O x 3 NAD -c/c/e +BS sotf NT Area under arm packed  Results for orders placed during the hospital encounter of 08/08/11 (from the past 48 hour(s))  SURGICAL PCR SCREEN     Status: Abnormal   Collection Time   08/09/11 11:47 AM      Component Value Range Comment   MRSA, PCR NEGATIVE  NEGATIVE     Staphylococcus aureus POSITIVE (*) NEGATIVE    CULTURE, ROUTINE-ABSCESS     Status: Normal (Preliminary result)   Collection Time   08/09/11  3:02 PM      Component Value Range Comment   Specimen Description ABSCESS LEFT AXILLA      Special Requests NONE      Gram Stain        Value: ABUNDANT WBC PRESENT,BOTH PMN AND MONONUCLEAR     NO SQUAMOUS EPITHELIAL CELLS SEEN     NO ORGANISMS SEEN   Culture        Value: ABUNDANT STAPHYLOCOCCUS  AUREUS     Note: RIFAMPIN AND GENTAMICIN SHOULD NOT BE USED AS SINGLE DRUGS FOR TREATMENT OF STAPH INFECTIONS.   Report Status PENDING     ANAEROBIC CULTURE     Status: Normal (Preliminary result)   Collection Time   08/09/11  3:02 PM      Component Value Range Comment   Specimen Description ABSCESS LEFT AXILLA      Special Requests NONE      Gram Stain        Value: ABUNDANT WBC PRESENT,BOTH PMN AND MONONUCLEAR     NO SQUAMOUS EPITHELIAL CELLS SEEN     NO ORGANISMS SEEN   Culture        Value: NO ANAEROBES ISOLATED; CULTURE IN PROGRESS FOR 5 DAYS   Report Status PENDING     URINE RAPID DRUG SCREEN (HOSP PERFORMED)     Status: Abnormal   Collection Time   08/09/11  6:08 PM      Component Value Range Comment   Opiates NONE DETECTED  NONE DETECTED     Cocaine POSITIVE (*) NONE DETECTED     Benzodiazepines POSITIVE (*) NONE DETECTED     Amphetamines NONE DETECTED  NONE DETECTED     Tetrahydrocannabinol NONE DETECTED  NONE DETECTED     Barbiturates NONE DETECTED  NONE DETECTED    CBC     Status: Abnormal   Collection Time   08/10/11  5:00 AM      Component Value Range Comment   WBC 4.4  4.0 - 10.5 (K/uL)    RBC 3.97  3.87 - 5.11 (MIL/uL)    Hemoglobin 9.7 (*) 12.0 - 15.0 (g/dL)    HCT 40.9 (*) 81.1 - 46.0 (%)    MCV 78.3  78.0 - 100.0 (fL)    MCH 24.4 (*) 26.0 - 34.0 (pg)    MCHC 31.2  30.0 - 36.0 (g/dL)    RDW 91.4 (*) 78.2 - 15.5 (%)    Platelets 233  150 - 400 (K/uL)   BASIC METABOLIC PANEL     Status: Abnormal   Collection Time   08/10/11  5:00 AM      Component Value Range Comment   Sodium 138  135 - 145 (mEq/L)    Potassium 3.8  3.5 - 5.1 (mEq/L)    Chloride 105  96 - 112 (mEq/L)  CO2 25  19 - 32 (mEq/L)    Glucose, Bld 100 (*) 70 - 99 (mg/dL)    BUN 9  6 - 23 (mg/dL)    Creatinine, Ser 5.78  0.50 - 1.10 (mg/dL)    Calcium 8.9  8.4 - 10.5 (mg/dL)    GFR calc non Af Amer 60 (*) >90 (mL/min)    GFR calc Af Amer 70 (*) >90 (mL/min)     Dg Chest 2 View  08/09/2011   -*RADIOLOGY REPORT*  Clinical Data: Hypertension, asthma, left axillary abscess.  CHEST - 2 VIEW  Comparison: 11/30/2009  Findings: Cardiomegaly.  Central vascular congestion.  Mild interstitial prominence.  Mild retrocardiac opacity.  No pneumothorax.  No definite pleural effusion.  No acute osseous finding.  IMPRESSION: Cardiomegaly with mild edema pattern.  Mild retrocardiac opacity; atelectasis versus infiltrate.  Original Report Authenticated By: Waneta Martins, M.D.     Disposition: home  Diet: cardiac  Activity: as tolerated   Follow-up Appts: Discharge Orders    Future Orders Please Complete By Expires   Diet - low sodium heart healthy      Increase activity slowly      Discharge instructions      Comments:   Wound care per surgery- directions        Time spent on discharge, talking to the patient, and coordinating care: 45 mins.   SignedMarlin Canary, DO 08/11/2011, 11:29 AM

## 2011-08-12 LAB — CULTURE, ROUTINE-ABSCESS

## 2011-08-14 LAB — ANAEROBIC CULTURE

## 2011-08-19 ENCOUNTER — Ambulatory Visit (INDEPENDENT_AMBULATORY_CARE_PROVIDER_SITE_OTHER): Payer: PRIVATE HEALTH INSURANCE | Admitting: General Surgery

## 2011-08-19 ENCOUNTER — Encounter (INDEPENDENT_AMBULATORY_CARE_PROVIDER_SITE_OTHER): Payer: Self-pay | Admitting: General Surgery

## 2011-08-19 VITALS — BP 112/78 | HR 78 | Temp 97.6°F | Resp 16 | Ht 66.0 in | Wt 163.6 lb

## 2011-08-19 DIAGNOSIS — L0390 Cellulitis, unspecified: Secondary | ICD-10-CM

## 2011-08-19 DIAGNOSIS — L0291 Cutaneous abscess, unspecified: Secondary | ICD-10-CM

## 2011-08-19 NOTE — Progress Notes (Signed)
Subjective:     Patient ID: Morgan Bean, female   DOB: November 22, 1965, 46 y.o.   MRN: [id number]  HPI The patient is a 46 year old black female who is a couple weeks out from an incision and drainage of a left axillary abscess. She is doing well. She has minor soreness. She is showering daily and changing the dressings at home.  Review of Systems     Objective:   Physical Exam On exam the 2 open wounds in the left axilla are very clean with good granulation tissue. There is no tunneling.    Assessment:     Status post incision and drainage of left axillary abscess    Plan:     At this point she will continue showering and daily dressing changes. We will plan to see her back in about 3 weeks to check the wounds

## 2011-08-19 NOTE — Patient Instructions (Signed)
Continue daily showering and dressing changes 

## 2011-09-07 ENCOUNTER — Encounter (INDEPENDENT_AMBULATORY_CARE_PROVIDER_SITE_OTHER): Payer: PRIVATE HEALTH INSURANCE | Admitting: General Surgery

## 2011-09-11 ENCOUNTER — Encounter (INDEPENDENT_AMBULATORY_CARE_PROVIDER_SITE_OTHER): Payer: Self-pay | Admitting: General Surgery

## 2011-11-12 ENCOUNTER — Encounter (HOSPITAL_COMMUNITY): Payer: Self-pay | Admitting: *Deleted

## 2011-11-12 ENCOUNTER — Emergency Department (HOSPITAL_COMMUNITY): Payer: Self-pay

## 2011-11-12 ENCOUNTER — Emergency Department (HOSPITAL_COMMUNITY)
Admission: EM | Admit: 2011-11-12 | Discharge: 2011-11-13 | Disposition: A | Discharge status: Other Acute Inpatient Hospital | Payer: Self-pay | Attending: Emergency Medicine | Admitting: Emergency Medicine

## 2011-11-12 DIAGNOSIS — R0989 Other specified symptoms and signs involving the circulatory and respiratory systems: Secondary | ICD-10-CM | POA: Insufficient documentation

## 2011-11-12 DIAGNOSIS — Z79899 Other long term (current) drug therapy: Secondary | ICD-10-CM | POA: Insufficient documentation

## 2011-11-12 DIAGNOSIS — R45851 Suicidal ideations: Secondary | ICD-10-CM | POA: Insufficient documentation

## 2011-11-12 DIAGNOSIS — Z8673 Personal history of transient ischemic attack (TIA), and cerebral infarction without residual deficits: Secondary | ICD-10-CM | POA: Insufficient documentation

## 2011-11-12 DIAGNOSIS — I079 Rheumatic tricuspid valve disease, unspecified: Secondary | ICD-10-CM | POA: Insufficient documentation

## 2011-11-12 DIAGNOSIS — I509 Heart failure, unspecified: Secondary | ICD-10-CM | POA: Insufficient documentation

## 2011-11-12 DIAGNOSIS — J45909 Unspecified asthma, uncomplicated: Secondary | ICD-10-CM | POA: Insufficient documentation

## 2011-11-12 DIAGNOSIS — F1410 Cocaine abuse, uncomplicated: Secondary | ICD-10-CM | POA: Insufficient documentation

## 2011-11-12 DIAGNOSIS — Z7982 Long term (current) use of aspirin: Secondary | ICD-10-CM | POA: Insufficient documentation

## 2011-11-12 DIAGNOSIS — R079 Chest pain, unspecified: Secondary | ICD-10-CM | POA: Insufficient documentation

## 2011-11-12 DIAGNOSIS — I10 Essential (primary) hypertension: Secondary | ICD-10-CM | POA: Insufficient documentation

## 2011-11-12 DIAGNOSIS — R0609 Other forms of dyspnea: Secondary | ICD-10-CM | POA: Insufficient documentation

## 2011-11-12 DIAGNOSIS — F17200 Nicotine dependence, unspecified, uncomplicated: Secondary | ICD-10-CM | POA: Insufficient documentation

## 2011-11-12 LAB — COMPREHENSIVE METABOLIC PANEL
BUN: 9 mg/dL (ref 6–23)
CO2: 17 mEq/L — ABNORMAL LOW (ref 19–32)
Calcium: 8.9 mg/dL (ref 8.4–10.5)
Chloride: 107 mEq/L (ref 96–112)
Creatinine, Ser: 0.88 mg/dL (ref 0.50–1.10)
GFR calc Af Amer: 89 mL/min — ABNORMAL LOW (ref >90)
GFR calc non Af Amer: 76 mL/min — ABNORMAL LOW (ref >90)
Total Bilirubin: 0.2 mg/dL — ABNORMAL LOW (ref 0.3–1.2)

## 2011-11-12 LAB — URINALYSIS, ROUTINE W REFLEX MICROSCOPIC
Nitrite: NEGATIVE
Specific Gravity, Urine: 1.02 (ref 1.005–1.030)
Urobilinogen, UA: 1 mg/dL (ref 0.0–1.0)
pH: 6.5 (ref 5.0–8.0)

## 2011-11-12 LAB — URINE MICROSCOPIC-ADD ON

## 2011-11-12 LAB — BASIC METABOLIC PANEL
BUN: 8 mg/dL (ref 6–23)
Chloride: 102 mEq/L (ref 96–112)
Creatinine, Ser: 1.04 mg/dL (ref 0.50–1.10)
GFR calc Af Amer: 72 mL/min — ABNORMAL LOW (ref >90)
GFR calc non Af Amer: 62 mL/min — ABNORMAL LOW (ref >90)
Glucose, Bld: 92 mg/dL (ref 70–99)

## 2011-11-12 LAB — CBC WITH DIFFERENTIAL/PLATELET
Basophils Absolute: 0 10*3/uL (ref 0.0–0.1)
Eosinophils Relative: 2 % (ref 0–5)
HCT: 34 % — ABNORMAL LOW (ref 36.0–46.0)
Hemoglobin: 11 g/dL — ABNORMAL LOW (ref 12.0–15.0)
Lymphocytes Relative: 51 % — ABNORMAL HIGH (ref 12–46)
MCHC: 32.4 g/dL (ref 30.0–36.0)
MCV: 78.5 fL (ref 78.0–100.0)
Monocytes Absolute: 0.7 10*3/uL (ref 0.1–1.0)
Monocytes Relative: 20 % — ABNORMAL HIGH (ref 3–12)
Neutro Abs: 1 10*3/uL — ABNORMAL LOW (ref 1.7–7.7)
RDW: 17.1 % — ABNORMAL HIGH (ref 11.5–15.5)
WBC: 3.7 10*3/uL — ABNORMAL LOW (ref 4.0–10.5)

## 2011-11-12 LAB — ETHANOL: Alcohol, Ethyl (B): <11 mg/dL (ref 0–11)

## 2011-11-12 LAB — RAPID URINE DRUG SCREEN, HOSP PERFORMED
Barbiturates: NOT DETECTED
Benzodiazepines: NOT DETECTED
Cocaine: POSITIVE — AB

## 2011-11-12 LAB — PREGNANCY, URINE: Preg Test, Ur: NEGATIVE

## 2011-11-12 MED ORDER — CLONIDINE HCL 0.2 MG PO TABS
0.3000 mg | ORAL_TABLET | Freq: Two times a day (BID) | ORAL | Status: DC
  Administered 2011-11-12 – 2011-11-13 (×2): 0.3 mg via ORAL
  Filled 2011-11-12: qty 3
  Filled 2011-11-12 (×2): qty 1

## 2011-11-12 MED ORDER — HYDRALAZINE HCL 20 MG/ML IJ SOLN
10.0000 mg | Freq: Once | INTRAMUSCULAR | Status: DC
  Filled 2011-11-12: qty 1

## 2011-11-12 MED ORDER — ONDANSETRON HCL 8 MG PO TABS: 4.0000 mg | ORAL_TABLET | Freq: Three times a day (TID) | ORAL | Status: DC | PRN

## 2011-11-12 MED ORDER — LORAZEPAM 1 MG PO TABS
1.0000 mg | ORAL_TABLET | Freq: Once | ORAL | Status: AC
  Administered 2011-11-12: 1 mg via ORAL
  Filled 2011-11-12: qty 2

## 2011-11-12 MED ORDER — IBUPROFEN 200 MG PO TABS: 600.0000 mg | ORAL_TABLET | Freq: Three times a day (TID) | ORAL | Status: DC | PRN

## 2011-11-12 MED ORDER — FUROSEMIDE 20 MG PO TABS
20.0000 mg | ORAL_TABLET | Freq: Every day | ORAL | Status: DC
  Administered 2011-11-12 – 2011-11-13 (×2): 20 mg via ORAL
  Filled 2011-11-12 (×2): qty 1

## 2011-11-12 MED ORDER — AMLODIPINE BESYLATE 5 MG PO TABS
5.0000 mg | ORAL_TABLET | Freq: Every day | ORAL | Status: DC
  Administered 2011-11-12 – 2011-11-13 (×2): 5 mg via ORAL
  Filled 2011-11-12 (×2): qty 1

## 2011-11-12 MED ORDER — LISINOPRIL 20 MG PO TABS
20.0000 mg | ORAL_TABLET | Freq: Every day | ORAL | Status: DC
  Administered 2011-11-12 – 2011-11-13 (×2): 20 mg via ORAL
  Filled 2011-11-12 (×2): qty 1

## 2011-11-12 MED ORDER — ASPIRIN 81 MG PO TABS: 81.0000 mg | ORAL_TABLET | Freq: Every day | ORAL | Status: DC

## 2011-11-12 MED ORDER — ALBUTEROL SULFATE HFA 108 (90 BASE) MCG/ACT IN AERS
2.0000 | INHALATION_SPRAY | RESPIRATORY_TRACT | Status: DC
  Administered 2011-11-12 – 2011-11-13 (×2): 2 via RESPIRATORY_TRACT
  Filled 2011-11-12: qty 6.7

## 2011-11-12 MED ORDER — ASPIRIN 81 MG PO CHEW
81.0000 mg | CHEWABLE_TABLET | Freq: Every day | ORAL | Status: DC
  Administered 2011-11-12 – 2011-11-13 (×2): 81 mg via ORAL
  Filled 2011-11-12 (×2): qty 1

## 2011-11-12 NOTE — ED Notes (Signed)
The pt has been depressed and suicidal .  She was seen at mental health and they sent her here because her bp was elevated..  lmp now

## 2011-11-12 NOTE — ED Provider Notes (Signed)
History  This chart was scribed for Morgan Octave, MD by [name]. This patient was seen in room TR05C/TR05C and the patient's care was started at 5:14PM  CSN: [id number]  Arrival date & time 11/12/11  1541   First MD Initiated Contact with Patient 11/12/11 1714      Chief Complaint  Patient presents with  . Suicidal     The history is provided by the patient. No language interpreter was used.    Morgan Bean is a 46 y.o. female who presents to the Emergency Department complaining of gradual onset, gradually worsening, constant SI and depression that started after her husband passed away one month ago. Pt reports that her SI plan is to overdose on her daily medications. Daughter states that the pt was seen at [facility] and they sent her here for HTN. Pt reports that she has a h/o HTN and has been off of her HTN medications for the past 2 weeks. She denies having a h/o depression and states that she is not medicated for depression. Pt also states that she did crack/cocaine for 24 years, last use was 5 days ago. She also c/o 2 to 3 hours of gradual onset, gradually worsening HA in bilateral temples and 2 to 3 days of constant left-sided CP. She states that she has chronic abdominal pain from fibroid tumors but denies changes. She denies visual changes, SOB, nausea and emesis as associated symptoms. She has a h/o CAD, asthma and CHF. She is a current everyday smoker and occasional alcohol user.   Past Medical History  Diagnosis Date  . Coronary artery disease     Lexiscan myoview (10/12) with significant ST depression upon Lexiscan injection but no ischemia or infarction on perfusion images. Left heart cath (10/12): 30% mid LAD, 30% ostial D1, 50% ostial D2.    . Stroke   . Hypertension   . Tricuspid regurgitation   . Iron deficiency     hx of  . Polysubstance abuse   . Tobacco abuse   . Leukocytoclastic vasculitis   . Pulmonary HTN     Echo (9/11) with EF 65%, mild  LVH, mild AI, mild MR, moderate to possibly severe TR with PA systolic pressure 52 mmHg. Echo (10/12): severe LV hypertrophy, EF 60-65%, mild MR, mild AI, moderate to severe tricuspid regurgitation, PA systolic pressure 38 mmHg.    . Asthma   . CHF (congestive heart failure)     Past Surgical History  Procedure Date  . Ankle surgery     right  . Cardiac catheterization   . I&d extremity 08/09/2011    Procedure: IRRIGATION AND DEBRIDEMENT EXTREMITY;  Surgeon: Robyne Askew, MD;  Location: [facility] OR;  Service: General;  Laterality: Left;  i & D Left axilla abscess    Family History  Problem Relation Age of Onset  . Coronary artery disease Mother     DM and breast cancer  . Diabetes Mother   . Cancer Maternal Grandmother     breast    History  Substance Use Topics  . Smoking status: Current Some Day Smoker -- 0.5 packs/day for 20 years    Types: Cigarettes  . Smokeless tobacco: Never Used  . Alcohol Use: Yes     pint of wine 2-3 times a week    No OB history provided.  Review of Systems  A complete 10 system review of systems was obtained and all systems are negative except as noted in the HPI and  PMH.    Allergies  Ciprofloxacin  Home Medications   Current Outpatient Rx  Name Route Sig Dispense Refill  . PROVENTIL HFA IN Inhalation Inhale into the lungs. 2 puffs every 4 hours as needed     . AMLODIPINE BESYLATE 5 MG PO TABS Oral Take 5 mg by mouth daily.    . ASPIRIN 81 MG PO TABS Oral Take 81 mg by mouth daily.      [name] CLONIDINE HCL 0.3 MG PO TABS Oral Take 0.3 mg by mouth 2 (two) times daily.    . FUROSEMIDE 20 MG PO TABS Oral Take 1 tablet (20 mg total) by mouth daily.    [name] LISINOPRIL 20 MG PO TABS Oral Take 20 mg by mouth daily.    [name] LORATADINE 10 MG PO TABS Oral Take 10 mg by mouth daily.      . NON FORMULARY  ferosul 325 mg  Take 1 tab bid     . POTASSIUM CHLORIDE 10 MEQ PO TBCR Oral Take 1 tablet (10 mEq total) by mouth daily.      Triage Vitals: BP 176/114   Pulse 85  Temp 98.4 F (36.9 C) (Oral)  Resp 20  SpO2 99%  LMP 11/12/2011  Physical Exam  Nursing note and vitals reviewed. Constitutional: She is oriented to person, place, and time. She appears well-developed and well-nourished. No distress.  HENT:  Head: Normocephalic and atraumatic.  Eyes: EOM are normal.       Visual fields full to confrontation  Neck: Neck supple. No tracheal deviation present.  Cardiovascular: Normal rate and regular rhythm.   Pulmonary/Chest: Effort normal and breath sounds normal. No respiratory distress. She exhibits tenderness (reproducible left chest wall tenderness).  Abdominal: Soft. There is no tenderness.  Musculoskeletal: Normal range of motion.       Strength is 5/5 throughout  Neurological: She is alert and oriented to person, place, and time.       No past pointing, no ataxia   Skin: Skin is warm and dry.  Psychiatric: She has a normal mood and affect. Her behavior is normal.    ED Course  Procedures (including critical care time)  DIAGNOSTIC STUDIES: Oxygen Saturation is 99% on room air, normal by my interpretation.    COORDINATION OF CARE: 5:28PM-Discussed treatment plan which includes treating her HTN, CT scan of the head and CXR with pt at bedside and pt agreed to plan.  6:30PM-Discussed pt's case with ACT and they agreed to evaluate the pt. Call ended at 6:35PM.  Labs Reviewed  URINALYSIS, ROUTINE W REFLEX MICROSCOPIC - Abnormal; Notable for the following:    Color, Urine RED (*)  BIOCHEMICALS MAY BE AFFECTED BY COLOR   APPearance CLOUDY (*)     Hgb urine dipstick LARGE (*)     Bilirubin Urine SMALL (*)     Ketones, ur 15 (*)     Protein, ur 100 (*)     Leukocytes, UA SMALL (*)     All other components within normal limits  URINE RAPID DRUG SCREEN (HOSP PERFORMED) - Abnormal; Notable for the following:    Cocaine POSITIVE (*)     All other components within normal limits  CBC WITH DIFFERENTIAL - Abnormal; Notable for the  following:    WBC 3.7 (*)     Hemoglobin 11.0 (*)     HCT 34.0 (*)     MCH 25.4 (*)     RDW 17.1 (*)     Neutrophils Relative 27 (*)  Neutro Abs 1.0 (*)     Lymphocytes Relative 51 (*)     Monocytes Relative 20 (*)     All other components within normal limits  COMPREHENSIVE METABOLIC PANEL - Abnormal; Notable for the following:    CO2 17 (*)     Albumin 3.1 (*)     Total Bilirubin 0.2 (*)     GFR calc non Af Amer 76 (*)     GFR calc Af Amer 89 (*)     All other components within normal limits  URINE MICROSCOPIC-ADD ON - Abnormal; Notable for the following:    Squamous Epithelial / LPF MANY (*)     Bacteria, UA MANY (*)     All other components within normal limits  PREGNANCY, URINE  ETHANOL  TROPONIN I   Dg Chest 2 View  11/12/2011  *RADIOLOGY REPORT*  Clinical Data: Chest pain, history hypertension and asthma.  Smoker 1 ppd  CHEST - 2 VIEW  Comparison: Aug 09, 2011, November 30, 2009  Findings: There is no focal infiltrate,  pulmonary edema or pleural effusion.  The aorta is tortuous.  Heart size is normal.  On the lateral view, there is a questioned nodule in the anterior lung base not present on prior lateral chest x-rays of September and May 2011. The soft tissue and osseous structures are unremarkable.  IMPRESSION: No acute cardiopulmonary disease identified.  Questioned nodule in the anterior lung base seen on the lateral view, further evaluation of chest CT is recommended,   Original Report Authenticated By: Sherian Rein, M.D.    Ct Head Wo Contrast  11/12/2011  *RADIOLOGY REPORT*  Clinical Data: Hypertension.  History of stroke.  CT HEAD WITHOUT CONTRAST  Technique:  Contiguous axial images were obtained from the base of the skull through the vertex without contrast.  Comparison: 10/18/2007  Findings: Chronic ischemic changes in the periventricular white matter.  Lacunar infarct in the left pons and basal ganglia are noted.  Ectatic vasculature in the  left sylvian fissure.   There is a new focus of hypodensity in the right frontal periventricular white matter, compatible with an infarct of indeterminate age.  No mass effect, midline shift, or acute hemorrhage.  Mastoid air cells clear.  Paranasal sinuses clear.  IMPRESSION: Chronic ischemic changes.  There is a new infarct in the right frontal periventricular white matter that is likely chronic.   Original Report Authenticated By: Donavan Burnet, M.D.      No diagnosis found.    MDM  Suicidal with depression.  Off of BP meds x 2 weeks.  Gradual onset headache, constant chest pain x 3 days.  Last used cocaine 5 days ago.  CT head negative for hemorrhage. Possible new infarct will be further evaluated with MRI.  ekg nonischemic, troponin negative.  Home BP meds ordered.  D/w ACT team.   Date: 11/12/2011  Rate: 81  Rhythm: normal sinus rhythm  QRS Axis: normal  Intervals: normal  ST/T Wave abnormalities: normal  Conduction Disutrbances:none  Narrative Interpretation:   Old EKG Reviewed: unchanged     I personally performed the services described in this documentation, which was scribed in my presence.  The recorded information has been reviewed and considered.    Morgan Octave, MD 11/12/11 2025

## 2011-11-12 NOTE — ED Notes (Signed)
No bp meds for 2 months

## 2011-11-12 NOTE — ED Notes (Signed)
Daughter- Cell # [phone]

## 2011-11-12 NOTE — ED Notes (Signed)
Patient transported to MRI 

## 2011-11-12 NOTE — ED Notes (Signed)
Chg and a-c notified of the need for a sitter

## 2011-11-13 ENCOUNTER — Inpatient Hospital Stay (HOSPITAL_COMMUNITY)
Admission: AD | Admit: 2011-11-13 | Discharge: 2011-11-19 | DRG: 885 | Disposition: A | Discharge status: Home / Self Care | Payer: Self-pay | Source: Ambulatory Visit | Attending: Psychiatry | Admitting: Psychiatry

## 2011-11-13 DIAGNOSIS — F339 Major depressive disorder, recurrent, unspecified: Secondary | ICD-10-CM | POA: Diagnosis present

## 2011-11-13 DIAGNOSIS — I079 Rheumatic tricuspid valve disease, unspecified: Secondary | ICD-10-CM | POA: Diagnosis present

## 2011-11-13 DIAGNOSIS — F17200 Nicotine dependence, unspecified, uncomplicated: Secondary | ICD-10-CM | POA: Diagnosis present

## 2011-11-13 DIAGNOSIS — I509 Heart failure, unspecified: Secondary | ICD-10-CM | POA: Diagnosis present

## 2011-11-13 DIAGNOSIS — F1410 Cocaine abuse, uncomplicated: Secondary | ICD-10-CM | POA: Diagnosis present

## 2011-11-13 DIAGNOSIS — Z8673 Personal history of transient ischemic attack (TIA), and cerebral infarction without residual deficits: Secondary | ICD-10-CM

## 2011-11-13 DIAGNOSIS — F1010 Alcohol abuse, uncomplicated: Secondary | ICD-10-CM | POA: Diagnosis present

## 2011-11-13 DIAGNOSIS — I10 Essential (primary) hypertension: Secondary | ICD-10-CM | POA: Diagnosis present

## 2011-11-13 DIAGNOSIS — Z853 Personal history of malignant neoplasm of breast: Secondary | ICD-10-CM

## 2011-11-13 DIAGNOSIS — Z79899 Other long term (current) drug therapy: Secondary | ICD-10-CM

## 2011-11-13 DIAGNOSIS — I2510 Atherosclerotic heart disease of native coronary artery without angina pectoris: Secondary | ICD-10-CM | POA: Diagnosis present

## 2011-11-13 DIAGNOSIS — F1411 Cocaine abuse, in remission: Secondary | ICD-10-CM | POA: Diagnosis present

## 2011-11-13 DIAGNOSIS — F333 Major depressive disorder, recurrent, severe with psychotic symptoms: Principal | ICD-10-CM | POA: Diagnosis present

## 2011-11-13 MED ORDER — CHLORDIAZEPOXIDE HCL 25 MG PO CAPS: 25.0000 mg | ORAL_CAPSULE | Freq: Four times a day (QID) | ORAL | Status: AC | PRN

## 2011-11-13 MED ORDER — ALBUTEROL SULFATE HFA 108 (90 BASE) MCG/ACT IN AERS
2.0000 | INHALATION_SPRAY | RESPIRATORY_TRACT | Status: DC | PRN
  Administered 2011-11-15 – 2011-11-17 (×2): 2 via RESPIRATORY_TRACT
  Filled 2011-11-13: qty 6.7

## 2011-11-13 MED ORDER — ALUM & MAG HYDROXIDE-SIMETH 200-200-20 MG/5ML PO SUSP
30.0000 mL | ORAL | Status: DC | PRN
  Administered 2011-11-16: 30 mL via ORAL

## 2011-11-13 MED ORDER — THIAMINE HCL 100 MG/ML IJ SOLN: 100.0000 mg | Freq: Once | INTRAMUSCULAR | Status: DC

## 2011-11-13 MED ORDER — HYDROXYZINE HCL 25 MG PO TABS
25.0000 mg | ORAL_TABLET | Freq: Four times a day (QID) | ORAL | Status: AC | PRN
  Administered 2011-11-14: 25 mg via ORAL
  Filled 2011-11-13: qty 1

## 2011-11-13 MED ORDER — FERROUS SULFATE 325 (65 FE) MG PO TABS
325.0000 mg | ORAL_TABLET | Freq: Two times a day (BID) | ORAL | Status: DC
  Administered 2011-11-14 – 2011-11-19 (×11): 325 mg via ORAL
  Filled 2011-11-13: qty 1
  Filled 2011-11-13: qty 28
  Filled 2011-11-13 (×8): qty 1
  Filled 2011-11-13: qty 28
  Filled 2011-11-13 (×3): qty 1
  Filled 2011-11-13 (×2): qty 28
  Filled 2011-11-13: qty 1

## 2011-11-13 MED ORDER — VITAMIN B-1 100 MG PO TABS
100.0000 mg | ORAL_TABLET | Freq: Every day | ORAL | Status: DC
  Administered 2011-11-14 – 2011-11-19 (×6): 100 mg via ORAL
  Filled 2011-11-13 (×8): qty 1

## 2011-11-13 MED ORDER — LOPERAMIDE HCL 2 MG PO CAPS: 2.0000 mg | ORAL_CAPSULE | ORAL | Status: AC | PRN

## 2011-11-13 MED ORDER — MAGNESIUM HYDROXIDE 400 MG/5ML PO SUSP
30.0000 mL | Freq: Every day | ORAL | Status: DC | PRN
  Administered 2011-11-16: 30 mL via ORAL

## 2011-11-13 MED ORDER — ASPIRIN 81 MG PO CHEW
81.0000 mg | CHEWABLE_TABLET | Freq: Every day | ORAL | Status: DC
  Administered 2011-11-14 – 2011-11-19 (×6): 81 mg via ORAL
  Filled 2011-11-13 (×2): qty 1
  Filled 2011-11-13 (×2): qty 14
  Filled 2011-11-13 (×6): qty 1

## 2011-11-13 MED ORDER — AMLODIPINE BESYLATE 5 MG PO TABS
5.0000 mg | ORAL_TABLET | Freq: Every day | ORAL | Status: DC
  Administered 2011-11-14: 5 mg via ORAL
  Filled 2011-11-13 (×4): qty 1

## 2011-11-13 MED ORDER — ONDANSETRON 4 MG PO TBDP: 4.0000 mg | ORAL_TABLET | Freq: Four times a day (QID) | ORAL | Status: AC | PRN

## 2011-11-13 MED ORDER — FUROSEMIDE 20 MG PO TABS
20.0000 mg | ORAL_TABLET | Freq: Every day | ORAL | Status: DC
  Administered 2011-11-14 – 2011-11-19 (×5): 20 mg via ORAL
  Filled 2011-11-13 (×3): qty 1
  Filled 2011-11-13: qty 14
  Filled 2011-11-13 (×5): qty 1
  Filled 2011-11-13: qty 14

## 2011-11-13 MED ORDER — CHLORDIAZEPOXIDE HCL 25 MG PO CAPS
25.0000 mg | ORAL_CAPSULE | Freq: Once | ORAL | Status: AC
  Administered 2011-11-13: 25 mg via ORAL
  Filled 2011-11-13: qty 1

## 2011-11-13 MED ORDER — LORATADINE 10 MG PO TABS
10.0000 mg | ORAL_TABLET | Freq: Every day | ORAL | Status: DC
  Administered 2011-11-13 – 2011-11-19 (×7): 10 mg via ORAL
  Filled 2011-11-13 (×4): qty 1
  Filled 2011-11-13: qty 14
  Filled 2011-11-13: qty 1
  Filled 2011-11-13: qty 14
  Filled 2011-11-13 (×4): qty 1

## 2011-11-13 MED ORDER — POTASSIUM CHLORIDE 20 MEQ PO PACK
10.0000 meq | PACK | Freq: Two times a day (BID) | ORAL | Status: DC
  Administered 2011-11-14: 10 meq via ORAL
  Filled 2011-11-13 (×5): qty 1

## 2011-11-13 MED ORDER — NICOTINE 21 MG/24HR TD PT24
21.0000 mg | MEDICATED_PATCH | Freq: Every day | TRANSDERMAL | Status: DC
  Administered 2011-11-13 – 2011-11-17 (×5): 21 mg via TRANSDERMAL
  Filled 2011-11-13 (×10): qty 1

## 2011-11-13 MED ORDER — ADULT MULTIVITAMIN W/MINERALS CH
1.0000 | ORAL_TABLET | Freq: Every day | ORAL | Status: DC
  Administered 2011-11-13 – 2011-11-19 (×7): 1 via ORAL
  Filled 2011-11-13 (×3): qty 1
  Filled 2011-11-13: qty 14
  Filled 2011-11-13 (×2): qty 1
  Filled 2011-11-13: qty 14
  Filled 2011-11-13 (×3): qty 1

## 2011-11-13 MED ORDER — LISINOPRIL 20 MG PO TABS
20.0000 mg | ORAL_TABLET | Freq: Every day | ORAL | Status: DC
  Administered 2011-11-14: 20 mg via ORAL
  Filled 2011-11-13 (×3): qty 1

## 2011-11-13 MED ORDER — TRAZODONE HCL 100 MG PO TABS
100.0000 mg | ORAL_TABLET | Freq: Once | ORAL | Status: AC
  Administered 2011-11-13: 100 mg via ORAL
  Filled 2011-11-13 (×2): qty 1

## 2011-11-13 MED ORDER — ACETAMINOPHEN 325 MG PO TABS
650.0000 mg | ORAL_TABLET | Freq: Four times a day (QID) | ORAL | Status: DC | PRN
  Administered 2011-11-15: 650 mg via ORAL

## 2011-11-13 MED ORDER — CLONIDINE HCL 0.2 MG PO TABS
0.3000 mg | ORAL_TABLET | Freq: Two times a day (BID) | ORAL | Status: DC
  Administered 2011-11-13 – 2011-11-19 (×9): 0.3 mg via ORAL
  Filled 2011-11-13: qty 1
  Filled 2011-11-13: qty 21
  Filled 2011-11-13 (×9): qty 1
  Filled 2011-11-13: qty 21
  Filled 2011-11-13: qty 1
  Filled 2011-11-13: qty 3
  Filled 2011-11-13 (×2): qty 1
  Filled 2011-11-13 (×2): qty 21
  Filled 2011-11-13: qty 1

## 2011-11-13 NOTE — Progress Notes (Signed)
Patient ID: Morgan Bean, female   DOB: 05/10/1965, 46 y.o.   MRN: [id number] Pt resting in bed with eyes closed.  RR equal and unlabored.  Pt's safety maintained on unit.

## 2011-11-13 NOTE — Progress Notes (Signed)
Patient ID: Morgan Bean, female   DOB: 08-May-1965, 46 y.o.   MRN: [id number] Pt is a 46 year old female who has an extensive medical history. He history includes cocaine abuse and ETOH abuse times 25 years. Other hx includes: CAD, CHF, asthma, stroke without residual . Pt presents to the Ed having feelings of lonliness and wanting to kill herself without a plan, States, " I am just trying to make life work." Unsure if she is here for drug rehab or not,. Pt states her relationship with her family is not good and does not have a good support system. Denies SI or HI at this time and does contract for safety. Allergic to cipro,.
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
## 2011-11-13 NOTE — ED Provider Notes (Signed)
Pt received in sign-over at 1500.  She presented for evaluation of SI and substance abuse.  She has been evaluated by the ACT Team and deemed appropriate for transfer to [facility].  Chart reviewed.  VS have remained stable.  Pt observed to be cooperative, interactive with staff, and to ambulate with a steady gait.  Plan transfer per recommendations.  Tobin Chad, MD 11/13/11 1524

## 2011-11-13 NOTE — ED Notes (Signed)
Visitor visiting pt, sitter at bedside

## 2011-11-13 NOTE — ED Notes (Signed)
Visitor has left. 

## 2011-11-13 NOTE — ED Notes (Signed)
Relieving sitter for lunch break; pt eating lunch tray

## 2011-11-13 NOTE — BHH Counselor (Signed)
Morgan Bean, assessment counselor at [facility], submitted Pt for admission to [facility]. Consulted with Theodoro Kos, [facility] who said an appropriate bed is not currently available. Dr. Harvie Heck Readling reviewed clinical information and accepted Pt to the services of Dr. Orson Aloe when a 500 hall bed is available.  [name], LPC

## 2011-11-13 NOTE — ED Notes (Signed)
Sitter has returned from lunch 

## 2011-11-13 NOTE — Progress Notes (Signed)
[facility] Group Notes:  (Counselor/Nursing/MHT/Case Management/Adjunct)  11/13/2011 9:57 PM  Type of Therapy:  Psychoeducational Skills  Participation Level:  Minimal  Participation Quality:  Attentive  Affect:  Appropriate  Cognitive:  Appropriate  Insight:  Limited  Engagement in Group:  Limited  Engagement in Therapy:  Limited  Modes of Intervention:  Education  Summary of Progress/Problems:The pt. stated in group that she had an "ok" day, but she would not explain any further. She did not have a goal for tomorrow since she was just admitted a few hours ago.   Hazle Coca S 11/13/2011, 9:57 PM

## 2011-11-13 NOTE — ED Notes (Signed)
To [facility] via security and [address]

## 2011-11-13 NOTE — BHH Counselor (Signed)
Per Theodoro Kos, bed 500-1 at [facility] is available. Notified Susa Day, assessment counselor at [facility], that Pt can be transferred.  [name]

## 2011-11-13 NOTE — ED Notes (Signed)
Pt refused albuterol tx- not exhibiting s/sx of sob.

## 2011-11-14 MED ORDER — CITALOPRAM HYDROBROMIDE 20 MG PO TABS
20.0000 mg | ORAL_TABLET | Freq: Every day | ORAL | Status: DC
  Administered 2011-11-14 – 2011-11-16 (×3): 20 mg via ORAL
  Filled 2011-11-14 (×5): qty 1

## 2011-11-14 MED ORDER — POTASSIUM CHLORIDE CRYS ER 10 MEQ PO TBCR
10.0000 meq | EXTENDED_RELEASE_TABLET | Freq: Two times a day (BID) | ORAL | Status: DC
  Administered 2011-11-14 – 2011-11-19 (×10): 10 meq via ORAL
  Filled 2011-11-14 (×7): qty 1
  Filled 2011-11-14: qty 28
  Filled 2011-11-14 (×2): qty 1
  Filled 2011-11-14: qty 28
  Filled 2011-11-14: qty 1
  Filled 2011-11-14: qty 28
  Filled 2011-11-14 (×2): qty 1
  Filled 2011-11-14: qty 28

## 2011-11-14 MED ORDER — AMLODIPINE BESYLATE 5 MG PO TABS
5.0000 mg | ORAL_TABLET | Freq: Every day | ORAL | Status: DC
  Administered 2011-11-15 – 2011-11-17 (×3): 5 mg via ORAL
  Filled 2011-11-14: qty 14
  Filled 2011-11-14 (×2): qty 1
  Filled 2011-11-14: qty 14
  Filled 2011-11-14 (×3): qty 1

## 2011-11-14 MED ORDER — LISINOPRIL 20 MG PO TABS
20.0000 mg | ORAL_TABLET | Freq: Every day | ORAL | Status: DC
  Administered 2011-11-15 – 2011-11-17 (×3): 20 mg via ORAL
  Filled 2011-11-14 (×2): qty 1
  Filled 2011-11-14: qty 14
  Filled 2011-11-14 (×3): qty 1
  Filled 2011-11-14: qty 14

## 2011-11-14 NOTE — H&P (Signed)
Psychiatric Admission Assessment Adult  Patient Identification:  Morgan Bean  Date of Evaluation:  11/14/2011  Chief Complaint:  Depressive Disorder NOS; Cocaine Abuse; ETOH Abuse  History of Present Illness: This is a 46 year old African-American female, admitted to [facility] from the [facility] ED with reports of suicidal thoughts  With plans to overdose on medication due to increased depression. Patient reports, "I went to the [facility] ED last Thursday night. I have have going through a lot of stuff. My emotions are now and it is getting the best of me. I am also trying to get disability benefits. While I was going through the disability evaluations, I was told to try to see a mental health person. I lost my job 2 weeks ago and I have to move in with my daughter. I am hurting very bad inside. I don't know how long that I have been depressed, all I know is that I am not doing well emotionally. I am not taking any medication for depression. I know I need to be on something for my depression. I have a lot of health problems. I had stroke and I am suffering from heart disease also. I take a lot of blood pressure medicines as well. Last Thursday, I was hearing this voice that was telling me how worthless I am. The voice was telling to go a head and end it because it is not going to get any better".  ROS: Negative for fever.  HENT: Negative for congestion and rhinorrhea.  Respiratory: Negative for cough, chest tightness and shortness of breath.  Cardiovascular: Negative for chest pain.  Gastrointestinal: Negative for nausea, vomiting and abdominal pain.  Skin: Negative for rash.  Neurological: Negative for weakness and headaches    Mood Symptoms:  Hopelessness, Past 2 Weeks, Sadness, SI, Worthlessness,  Depression Symptoms:  depressed mood, suicidal thoughts with specific plan, anxiety,  (Hypo) Manic Symptoms:  Irritable Mood,  Anxiety Symptoms:  Excessive  Worry,  Psychotic Symptoms:  Hallucinations: Auditory  PTSD Symptoms: Had a traumatic exposure:  None reported  Past Psychiatric History: Diagnosis: Major depressive disorder, recurrent with psychotic features.  Hospitalizations: [facility]  Outpatient Care: [facility]  Substance Abuse Care: None reported  Self-Mutilation: Denies   Suicidal Attempts: Denies attempts, admits thoughts.  Violent Behaviors: None reported   Past Medical History:   Past Medical History  Diagnosis Date  . Coronary artery disease     Lexiscan myoview (10/12) with significant ST depression upon Lexiscan injection but no ischemia or infarction on perfusion images. Left heart cath (10/12): 30% mid LAD, 30% ostial D1, 50% ostial D2.    . Stroke   . Hypertension   . Tricuspid regurgitation   . Iron deficiency     hx of  . Polysubstance abuse   . Tobacco abuse   . Leukocytoclastic vasculitis   . Pulmonary HTN     Echo (9/11) with EF 65%, mild LVH, mild AI, mild MR, moderate to possibly severe TR with PA systolic pressure 52 mmHg. Echo (10/12): severe LV hypertrophy, EF 60-65%, mild MR, mild AI, moderate to severe tricuspid regurgitation, PA systolic pressure 38 mmHg.    . Asthma   . CHF (congestive heart failure)      Allergies:   Allergies  Allergen Reactions  . Ciprofloxacin Itching   PTA Medications: Prescriptions prior to admission  Medication Sig Dispense Refill  . Albuterol Sulfate (PROVENTIL HFA IN) Inhale into the lungs. 2 puffs every 4 hours as  needed       . amLODipine (NORVASC) 5 MG tablet Take 5 mg by mouth daily.      [name] aspirin 81 MG tablet Take 81 mg by mouth daily.        . cloNIDine (CATAPRES) 0.3 MG tablet Take 0.3 mg by mouth 2 (two) times daily.      . furosemide (LASIX) 20 MG tablet Take 1 tablet (20 mg total) by mouth daily.      [name] lisinopril (PRINIVIL,ZESTRIL) 20 MG tablet Take 20 mg by mouth daily.      [name] loratadine (CLARITIN) 10 MG tablet Take 10 mg by mouth daily.        . NON  FORMULARY ferosul 325 mg  Take 1 tab bid       . potassium chloride (KLOR-CON 10) 10 MEQ CR tablet Take 1 tablet (10 mEq total) by mouth daily.         Substance Abuse History in the last 12 months: Substance Age of 1st Use Last Use Amount Specific Type  Nicotine 16 Prior to hosp 1 pack daily Cigarettes  Alcohol 16 Prior to hosp 1-3 bottles Beer, liquor  Cannabis Denies use  daily   Opiates Denies use     Cocaine 21 Prior to hosp 1-2 times daily Crack cocaine  Methamphetamines Denies use     LSD Denies use     Ecstasy Denies use     Benzodiazepines Denies use     Caffeine      Inhalants      Others:                         Consequences of Substance Abuse: Medical Consequences:  Liver damage Legal Consequences:  Arrests, jail time Family Consequences:  family discord  Social History: Current Place of Residence: [place]    Place of Birth: [employer] [place], [place]    Family Members: "My 2 children"  Marital Status:  Single  Children: 2  Sons: 1  Daughters: 1  Relationships: "I'm single"  Education:  GED  Educational Problems/Performance: "I got my GED"  Religious Beliefs/Practices: none reported  History of Abuse (Emotional/Phsycial/Sexual): None reported  Occupational Experiences: Unemployed  Military History:  None.  Legal History: None reported  Hobbies/Interests: None reported  Family History:   Family History  Problem Relation Age of Onset  . Coronary artery disease Mother     DM and breast cancer  . Diabetes Mother   . Cancer Maternal Grandmother     breast    Mental Status Examination/Evaluation: Objective:  Appearance: Disheveled  Eye Contact::  Fair  Speech:  Clear and Coherent  Volume:  Normal  Mood:  Depressed and Hopeless  Affect:  Flat and Tearful  Thought Process:  Coherent  Orientation:  Full  Thought Content:  Hallucinations: Auditory and Rumination  Suicidal Thoughts:  Yes.  without intent/plan  Homicidal Thoughts:  No  Memory:   Immediate;   Good Recent;   Good Remote;   Good  Judgement:  Impaired  Insight:  Fair  Psychomotor Activity:  Normal  Concentration:  Fair  Recall:  Good  Akathisia:  No  Handed:  Right  AIMS (if indicated):     Assets:  Desire for Improvement  Sleep:  Number of Hours: 4.25     Laboratory/X-Ray: None Psychological Evaluation(s)      Assessment:    AXIS I:  Major depressive disorder, recurrent episode with psychotic features. AXIS II:  Deferred  AXIS III:   Past Medical History  Diagnosis Date  . Coronary artery disease     Lexiscan myoview (10/12) with significant ST depression upon Lexiscan injection but no ischemia or infarction on perfusion images. Left heart cath (10/12): 30% mid LAD, 30% ostial D1, 50% ostial D2.    . Stroke   . Hypertension   . Tricuspid regurgitation   . Iron deficiency     hx of  . Polysubstance abuse   . Tobacco abuse   . Leukocytoclastic vasculitis   . Pulmonary HTN     Echo (9/11) with EF 65%, mild LVH, mild AI, mild MR, moderate to possibly severe TR with PA systolic pressure 52 mmHg. Echo (10/12): severe LV hypertrophy, EF 60-65%, mild MR, mild AI, moderate to severe tricuspid regurgitation, PA systolic pressure 38 mmHg.    . Asthma   . CHF (congestive heart failure)    AXIS IV:  economic problems, housing problems, occupational problems and other psychosocial or environmental problems AXIS V:  11-20 some danger of hurting self or others possible OR occasionally fails to maintain minimal personal hygiene OR gross impairment in communication  Treatment Plan/Recommendations: Admit for safety and stabilization. Review and reinstate any pertinent home medications for other health issues. Start Citalopram 20 mg daily for depression,. Change dosing for lisinopril 20 mg and Norvasc 5 mg to Q hs. Group counseling session, AA/NA meetings.  Treatment Plan Summary: Daily contact with patient to assess and evaluate symptoms and progress in  treatment Medication management Allow patient to attend and participate in the AA/NA meetings being held on this unit.  Current Medications:  Current Facility-Administered Medications  Medication Dose Route Frequency Provider Last Rate Last Dose  . acetaminophen (TYLENOL) tablet 650 mg  650 mg Oral Q6H PRN Nehemiah Settle, MD      . albuterol (PROVENTIL HFA;VENTOLIN HFA) 108 (90 BASE) MCG/ACT inhaler 2 puff  2 puff Inhalation Q4H PRN Nehemiah Settle, MD      . alum & mag hydroxide-simeth (MAALOX/MYLANTA) 200-200-20 MG/5ML suspension 30 mL  30 mL Oral Q4H PRN Nehemiah Settle, MD      . amLODipine (NORVASC) tablet 5 mg  5 mg Oral Daily Nehemiah Settle, MD   5 mg at 11/14/11 1610  . aspirin chewable tablet 81 mg  81 mg Oral Daily Nehemiah Settle, MD   81 mg at 11/14/11 9604  . chlordiazePOXIDE (LIBRIUM) capsule 25 mg  25 mg Oral Q6H PRN Nehemiah Settle, MD      . chlordiazePOXIDE (LIBRIUM) capsule 25 mg  25 mg Oral Once Nehemiah Settle, MD   25 mg at 11/13/11 2043  . cloNIDine (CATAPRES) tablet 0.3 mg  0.3 mg Oral BID Nehemiah Settle, MD   0.3 mg at 11/14/11 5409  . ferrous sulfate tablet 325 mg  325 mg Oral BID WC Nehemiah Settle, MD   325 mg at 11/14/11 0929  . furosemide (LASIX) tablet 20 mg  20 mg Oral Daily Nehemiah Settle, MD   20 mg at 11/14/11 8119  . hydrOXYzine (ATARAX/VISTARIL) tablet 25 mg  25 mg Oral Q6H PRN Nehemiah Settle, MD      . lisinopril (PRINIVIL,ZESTRIL) tablet 20 mg  20 mg Oral Daily Nehemiah Settle, MD   20 mg at 11/14/11 0929  . loperamide (IMODIUM) capsule 2-4 mg  2-4 mg Oral PRN Nehemiah Settle, MD      . loratadine (CLARITIN) tablet 10 mg  10 mg  Oral Daily Nehemiah Settle, MD   10 mg at 11/14/11 0929  . magnesium hydroxide (MILK OF MAGNESIA) suspension 30 mL  30 mL Oral Daily PRN Nehemiah Settle, MD      . multivitamin  with minerals tablet 1 tablet  1 tablet Oral Daily Nehemiah Settle, MD   1 tablet at 11/14/11 0929  . nicotine (NICODERM CQ - dosed in mg/24 hours) patch 21 mg  21 mg Transdermal Q0600 Nehemiah Settle, MD   21 mg at 11/13/11 2044  . ondansetron (ZOFRAN-ODT) disintegrating tablet 4 mg  4 mg Oral Q6H PRN Nehemiah Settle, MD      . potassium chloride (KLOR-CON) packet 10 mEq  10 mEq Oral BID Nehemiah Settle, MD      . thiamine (B-1) injection 100 mg  100 mg Intramuscular Once Nehemiah Settle, MD      . thiamine (VITAMIN B-1) tablet 100 mg  100 mg Oral Daily Nehemiah Settle, MD   100 mg at 11/14/11 0929  . traZODone (DESYREL) tablet 100 mg  100 mg Oral Once Nehemiah Settle, MD   100 mg at 11/13/11 2046   Facility-Administered Medications Ordered in Other Encounters  Medication Dose Route Frequency Provider Last Rate Last Dose  . DISCONTD: albuterol (PROVENTIL HFA;VENTOLIN HFA) 108 (90 BASE) MCG/ACT inhaler 2 puff  2 puff Inhalation Q4H [name], MD   2 puff at 11/13/11 0502  . DISCONTD: amLODipine (NORVASC) tablet 5 mg  5 mg Oral Daily [name], MD   5 mg at 11/13/11 1015  . DISCONTD: aspirin chewable tablet 81 mg  81 mg Oral Daily [name], MD   81 mg at 11/13/11 1016  . DISCONTD: cloNIDine (CATAPRES) tablet 0.3 mg  0.3 mg Oral BID [name], MD   0.3 mg at 11/13/11 1016  . DISCONTD: furosemide (LASIX) tablet 20 mg  20 mg Oral Daily [name], MD   20 mg at 11/13/11 1016  . DISCONTD: hydrALAZINE (APRESOLINE) injection 10 mg  10 mg Intravenous Once [name], MD      . DISCONTD: ibuprofen (ADVIL,MOTRIN) tablet 600 mg  600 mg Oral Q8H PRN [name], MD      . DISCONTD: lisinopril (PRINIVIL,ZESTRIL) tablet 20 mg  20 mg Oral Daily [name], MD   20 mg at 11/13/11 1016  . DISCONTD: ondansetron (ZOFRAN) tablet 4 mg  4 mg Oral Q8H PRN [name], MD        Observation  Level/Precautions:  Q 15 minute checks for safety  Laboratory:  Per ED lad reports: (+) Cocaine, Low Hemoglobin  Psychotherapy:  Group session/AA/NA meetings  Medications:  See medication lists  Routine PRN Medications:  Yes  Consultations:  None indicated at this time  Discharge Concerns:  Safety and sobriety  Other:     Armandina Stammer I 8/24/201311:25 AM

## 2011-11-14 NOTE — Progress Notes (Signed)
D Mistey is seen at the med window at 0900.Marland KitchenMarland KitchenShe is hesitant about handing in her completed self inventory.  She makes poor eye contact. She has a flat, blunted affect. She speaks softly and mumbles frequently.             A She requests to take her scheduled lisinopril and norvasc at bedtime tonight and is directed to speak to NP about this today. She does take her scheduled clonidine 0.3mg  and takes all other scheduled AM meds as ordered. She writes on her self inventory that she cont to have " off and on " SI and she rates her depression and hopelessness " 10/10". SHe verbally contracts with this nurse for safety.             R Safety is in place and POC cont with therapeutic relationship fostered PD RN [facility]

## 2011-11-14 NOTE — Progress Notes (Signed)
[facility] Group Notes:  (Counselor/Nursing/MHT/Case Management/Adjunct)  11/14/2011 4:15 PM  Type of Therapy:  Group Therapy  Participation Level:  Did Not Attend    Morgan Bean 11/14/2011, 4:15 PM

## 2011-11-14 NOTE — Progress Notes (Signed)
[facility] Group Notes:  (Counselor/Nursing/MHT/Case Management/Adjunct)  11/14/2011 11:37 PM  Type of Therapy:  Psychoeducational Skills  Participation Level:  Active  Participation Quality:  Attentive  Affect:  Appropriate  Cognitive:  Appropriate  Insight:  Good  Engagement in Group:  Good  Engagement in Therapy:  Good  Modes of Intervention:  Education  Summary of Progress/Problems: The pt. Verbalized that she had a better day as compared with yesterday. She states that her day was more successful since she took a nap and because she had a good visit with her brother and sister. In terms of coping skills, she states that she presently stays to herself when faced with stress.    Hazle Coca S 11/14/2011, 11:37 PM

## 2011-11-14 NOTE — [facility] Suicide Risk Assessment (Signed)
Suicide Risk Assessment  Admission Assessment     Demographic factors:  Assessment Details Time of Assessment: Admission Information Obtained From: Patient Current Mental Status:  Current Mental Status: Suicidal ideation indicated by patient Loss Factors:  Loss Factors: Decrease in vocational status;Loss of significant relationship Historical Factors:  Historical Factors: Impulsivity Risk Reduction Factors:  Risk Reduction Factors: Living with another person, especially a relative  CLINICAL FACTORS:   Depressive Disorder NOS; Cocaine Abuse; ETOH Abuse   COGNITIVE FEATURES THAT CONTRIBUTE TO RISK:  Closed-mindedness Loss of executive function Polarized thinking    SUICIDE RISK:   Moderate:  Frequent suicidal ideation with limited intensity, and duration, some specificity in terms of plans, no associated intent, good self-control, limited dysphoria/symptomatology, some risk factors present, and identifiable protective factors, including available and accessible social support.  PLAN OF CARE: Admitted to the adult psychiatric inpatient unit from the crisis stabilization. Control of for emotions and her possible detox treatment.    Burns Timson,JANARDHAHA R. 11/14/2011, 5:45 PM

## 2011-11-14 NOTE — Progress Notes (Signed)
1850 Pt's BP 1800 111/78 and 102/69 and pt states she still " feels swimmy-headed". NP [place] notified and will hold clonidine due at 1700 and recheck BP at HS ( when norvasc and lisinopril are due). PD RN [facility]

## 2011-11-14 NOTE — Progress Notes (Signed)
[facility] Group Notes:  (Counselor/Nursing/MHT/Case Management/Adjunct)  11/14/2011 5:32 PM  Type of Therapy:  Group Therapy  Participation Level:  Active  Participation Quality:  Appropriate  Affect:  Appropriate  Cognitive:  Appropriate  Insight:  Good  Engagement in Group:  Good  Engagement in Therapy:  Good  Modes of Intervention:  Education and Support  Summary of Progress/Problems: Pt. Stated she has developed coping skills to deal with stressful situations.  Pt. Was appropriate and supportive in group.   Sondra Come 11/14/2011, 5:32 PM

## 2011-11-14 NOTE — Progress Notes (Signed)
Psychoeducational Group Note  Date:  10/31/2011 Time: 1015  Group Topic/Focus:  Identifying Needs:   The focus of this group is to help patients identify their personal needs that have been historically problematic and identify healthy behaviors to address their needs.  Participation Level:  active Participation Quality: good Affect: flat Cognitive:    Insight:  good  Engagement in Group: engaged  Additional Comments: Pt was engaged in group, asked appropriate questions, shared personal life experience with the group and demonstrates willingness to process and understand her problems. PDuke RN BC   

## 2011-11-14 NOTE — Progress Notes (Signed)
[facility] Group Notes:  (Counselor/Nursing/MHT/Case Management/Adjunct)  11/14/2011 10:18 AM  Type of Therapy:  After care Planning Group  Pt. participated in  After care planning group and was given [name] Suicide Prevention information along with crisis numbers and agreed to use them if needed. Pt. Stated she was tired  But okay today. Pt. Saw Dr. Dan Humphreys Saturday and denies SI/Hi today.   [name] Blinks [place] 11/14/2011, 10:18 AM

## 2011-11-15 DIAGNOSIS — F339 Major depressive disorder, recurrent, unspecified: Secondary | ICD-10-CM | POA: Diagnosis present

## 2011-11-15 NOTE — Progress Notes (Signed)
[facility] Adult Inpatient Family/Significant Other Suicide Prevention Education  Suicide Prevention Education:  Education Completed; Morgan Bean-[phone]-Pt.'s daughter- has been identified by the patient as the family member/significant other with whom the patient will be residing, and identified as the person(s) who will aid the patient in the event of a mental health crisis (suicidal ideations/suicide attempt).  With written consent from the patient, the family member/significant other has been provided the following suicide prevention education, prior to the and/or following the discharge of the patient.  The suicide prevention education provided includes the following:  Suicide risk factors  Suicide prevention and interventions  National Suicide Hotline telephone number  [facility] assessment telephone number  [facility] Emergency Assistance 911  [facility] and/or Residential Mobile Crisis Unit telephone number  Request made of family/significant other to:  Remove weapons (e.g., guns, rifles, knives), all items previously/currently identified as safety concern.  Pt.'s states the pt.. Has no guns. Pt. Will be going to live with her after treatment. Pt.'s daughter feels strongly that pt. Should go into long term treatment before she is allowed to come stay with her.  Remove drugs/medications (over-the-counter, prescriptions, illicit drugs), all items previously/currently identified as a safety concern. Pt.s daughter states the pt. Has smoked Crack cocaine for 20 years plus and that doctors have told the pt. That unless she stops that she will have a heart attack if she does not quit. Pt.'s daughter is desperate and states that pt.'s addiction is bad. Pt.'s daughter is under stress from losing her father recently and can be reached at the number above.  The family member/significant other verbalizes understanding of the suicide prevention education information  provided.  The family member/significant other agrees to remove the items of safety concern listed above.  Morgan Bean 11/15/2011, 4:06 PM

## 2011-11-15 NOTE — Progress Notes (Signed)
Progress note was performed by physician extender and as a supervising MD, available for assistance near by during this rounds. Agree with the findings.   

## 2011-11-15 NOTE — Progress Notes (Signed)
Psychoeducational Group Note  Date:  11/15/2011 Time:  0930  Group Topic/Focus:  Spirituality:   The focus of this group is to discuss how one's spirituality can aide in recovery.  Participation Level:  Active  Participation Quality:  Appropriate  Affect:  Appropriate  Cognitive:  Appropriate  Insight:  Good  Engagement in Group:  Good  Additional Comments:    Meredith Staggers 11/15/2011, 11:25 AM

## 2011-11-15 NOTE — Progress Notes (Signed)
Writer entered patients room and observed her lying in bed awake. Patient inquired as to if her blood pressure medication had been switched to night time because it helps her to sleep. Patient informed that this will start on tomorrow night and if needed she could take a dose of visteril to aid with sleep. When writer inquired as to how her detox was going she reported that she is not detoxing and does not need the librium that is available to her. Support and encouragement offered, safety maintained on unit, will continue to monitor.

## 2011-11-15 NOTE — Progress Notes (Signed)
[facility] MD Progress Note  11/15/2011 1:48 PM  S: "I am doing much better today. I slept pretty good last night. My mood is still kind of up and down. But it is getting there. I am wearing my clothes today instead of the paper clothes.  That helps as well".  Diagnosis:   Axis I: Major depressive disorder, recurrent episode Axis II: Deferred Axis III:  Past Medical History  Diagnosis Date  . Coronary artery disease     Lexiscan myoview (10/12) with significant ST depression upon Lexiscan injection but no ischemia or infarction on perfusion images. Left heart cath (10/12): 30% mid LAD, 30% ostial D1, 50% ostial D2.    . Stroke   . Hypertension   . Tricuspid regurgitation   . Iron deficiency     hx of  . Polysubstance abuse   . Tobacco abuse   . Leukocytoclastic vasculitis   . Pulmonary HTN     Echo (9/11) with EF 65%, mild LVH, mild AI, mild MR, moderate to possibly severe TR with PA systolic pressure 52 mmHg. Echo (10/12): severe LV hypertrophy, EF 60-65%, mild MR, mild AI, moderate to severe tricuspid regurgitation, PA systolic pressure 38 mmHg.    . Asthma   . CHF (congestive heart failure)    Axis IV: economic problems, housing problems, occupational problems and other psychosocial or environmental problems Axis V: 51-60 moderate symptoms  ADL's:  Intact  Sleep: Good  Appetite:  Good  Suicidal Ideation:  Plan:  No Intent:  no Means:  no Homicidal Ideation:  Plan:  No Intent:  no Means:  no  AEB (as evidenced by): per patient reports.  Mental Status Examination/Evaluation: Objective:  Appearance: Casual  Eye Contact::  Good  Speech:  Clear and Coherent  Volume:  Normal  Mood:  "I feel a lot better"  Affect:  Appropriate  Thought Process:  Coherent  Orientation:  Full  Thought Content:  Rumination  Suicidal Thoughts:  No  Homicidal Thoughts:  No  Memory:  Immediate;   Good Recent;   Good Remote;   Good  Judgement:  Good  Insight:  Good  Psychomotor  Activity:  Normal  Concentration:  Good  Recall:  Good  Akathisia:  No  Handed:  Right  AIMS (if indicated):     Assets:  Desire for Improvement  Sleep:  Number of Hours: 6.75    Vital Signs:Blood pressure 139/99, pulse 73, temperature 97 F (36.1 C), temperature source Oral, resp. rate 20, height 5\' 6"  (1.676 m), weight 76.658 kg (169 lb), last menstrual period 10/22/2011, SpO2 100.00%. Current Medications: Current Facility-Administered Medications  Medication Dose Route Frequency Provider Last Rate Last Dose  . acetaminophen (TYLENOL) tablet 650 mg  650 mg Oral Q6H PRN Nehemiah Settle, MD      . albuterol (PROVENTIL HFA;VENTOLIN HFA) 108 (90 BASE) MCG/ACT inhaler 2 puff  2 puff Inhalation Q4H PRN Nehemiah Settle, MD      . alum & mag hydroxide-simeth (MAALOX/MYLANTA) 200-200-20 MG/5ML suspension 30 mL  30 mL Oral Q4H PRN Nehemiah Settle, MD      . amLODipine (NORVASC) tablet 5 mg  5 mg Oral Q2000 Sanjuana Kava, NP      . aspirin chewable tablet 81 mg  81 mg Oral Daily Nehemiah Settle, MD   81 mg at 11/15/11 0826  . chlordiazePOXIDE (LIBRIUM) capsule 25 mg  25 mg Oral Q6H PRN Nehemiah Settle, MD      . citalopram (  CELEXA) tablet 20 mg  20 mg Oral Daily Sanjuana Kava, NP   20 mg at 11/15/11 0826  . cloNIDine (CATAPRES) tablet 0.3 mg  0.3 mg Oral BID Nehemiah Settle, MD   0.3 mg at 11/15/11 0826  . ferrous sulfate tablet 325 mg  325 mg Oral BID WC Nehemiah Settle, MD   325 mg at 11/15/11 0827  . furosemide (LASIX) tablet 20 mg  20 mg Oral Daily Nehemiah Settle, MD   20 mg at 11/15/11 0827  . hydrOXYzine (ATARAX/VISTARIL) tablet 25 mg  25 mg Oral Q6H PRN Nehemiah Settle, MD   25 mg at 11/14/11 2122  . lisinopril (PRINIVIL,ZESTRIL) tablet 20 mg  20 mg Oral Q2000 Sanjuana Kava, NP      . loperamide (IMODIUM) capsule 2-4 mg  2-4 mg Oral PRN Nehemiah Settle, MD      . loratadine (CLARITIN) tablet  10 mg  10 mg Oral Daily Nehemiah Settle, MD   10 mg at 11/15/11 0827  . magnesium hydroxide (MILK OF MAGNESIA) suspension 30 mL  30 mL Oral Daily PRN Nehemiah Settle, MD      . multivitamin with minerals tablet 1 tablet  1 tablet Oral Daily Nehemiah Settle, MD   1 tablet at 11/15/11 0826  . nicotine (NICODERM CQ - dosed in mg/24 hours) patch 21 mg  21 mg Transdermal Q0600 Nehemiah Settle, MD   21 mg at 11/15/11 0626  . ondansetron (ZOFRAN-ODT) disintegrating tablet 4 mg  4 mg Oral Q6H PRN Nehemiah Settle, MD      . potassium chloride (K-DUR,KLOR-CON) CR tablet 10 mEq  10 mEq Oral BID Mike Craze, MD   10 mEq at 11/15/11 0826  . thiamine (B-1) injection 100 mg  100 mg Intramuscular Once Nehemiah Settle, MD      . thiamine (VITAMIN B-1) tablet 100 mg  100 mg Oral Daily Nehemiah Settle, MD   100 mg at 11/15/11 0826  . DISCONTD: potassium chloride (KLOR-CON) packet 10 mEq  10 mEq Oral BID Nehemiah Settle, MD   10 mEq at 11/14/11 0800    Lab Results: No results found for this or any previous visit (from the past 48 hour(s)).  Physical Findings: AIMS: Facial and Oral Movements Muscles of Facial Expression: None, normal Lips and Perioral Area: None, normal Jaw: None, normal Tongue: None, normal,Extremity Movements Upper (arms, wrists, hands, fingers): None, normal Lower (legs, knees, ankles, toes): None, normal, Trunk Movements Neck, shoulders, hips: None, normal, Overall Severity Severity of abnormal movements (highest score from questions above): None, normal Incapacitation due to abnormal movements: None, normal Patient's awareness of abnormal movements (rate only patient's report): No Awareness, Dental Status Current problems with teeth and/or dentures?: No Does patient usually wear dentures?: No  CIWA:  CIWA-Ar Total: 0  COWS:  COWS Total Score: 3   Treatment Plan Summary: Daily contact with patient to  assess and evaluate symptoms and progress in treatment Medication management  Plan: No changes made on the current treatment regime. Continue current treatment plan.   Armandina Stammer I 11/15/2011, 1:48 PM

## 2011-11-15 NOTE — Progress Notes (Signed)
[facility] Group Notes:  (Counselor/Nursing/MHT/Case Management/Adjunct)  11/15/2011 5:38 PM  Type of Therapy:  Group Therapy  Participation Level:  Active  Participation Quality:  Appropriate and Attentive  Affect:  Appropriate  Cognitive:  Appropriate  Insight:  Limited  Engagement in Group:  Good  Engagement in Therapy:  Good  Modes of Intervention:  Clarification, Education, Socialization and Support  Summary of Progress/Problems: Pt. participated in group on health support systems and how  they(patients) can support themselves when their supports are not around. The patients also shared what the word support " meant to them".  Each pt. Also shared who they had as a support in their life and participated in a support activity and what it me and to actually feel support from someone. The pt. Stated that a supprter is somone who does not try to change you. Pt. Spoke about support of family.  [name] Blinks Anthem 11/15/2011, 5:38 PM
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
## 2011-11-15 NOTE — Progress Notes (Signed)
Morgan Bean is seen in the consult room being interviewed by the counselor. She is alert. She has a sad, flat affect. She takes all of her medications as ordered by her MD this AM. She completed her self inventory and on it she writes she has " off and on" SI, she says " don't know" as far as her DC plans are concerned and she rates her depression and hopelessness "8/8". \           A She is engaged in her POC as evidenced by complying with her meds and attending her groups.             R Safety is in place, POC cont with therapeutic relationship fostered PD RN [facility]

## 2011-11-15 NOTE — Progress Notes (Signed)
Patient seen at medication window c/o a slight headache that she has had all day, patient reports she was wheezing earlier and requested her inhaler which was given along with 2 tylenol. Patient reported that her day has been okay, hasn't had much of an appetite which is normal for her. Patient offered support and encouraged to take it easy since she reports feeling tired. Safety maintained on unit, will continue to monitor patient.

## 2011-11-15 NOTE — H&P (Signed)
Progress note was performed by physician extender and as a supervising MD, available for assistance near by during this rounds.  

## 2011-11-16 DIAGNOSIS — F1411 Cocaine abuse, in remission: Secondary | ICD-10-CM | POA: Diagnosis present

## 2011-11-16 DIAGNOSIS — F1410 Cocaine abuse, uncomplicated: Secondary | ICD-10-CM

## 2011-11-16 MED ORDER — CITALOPRAM HYDROBROMIDE 40 MG PO TABS
40.0000 mg | ORAL_TABLET | Freq: Every day | ORAL | Status: DC
  Administered 2011-11-17 – 2011-11-19 (×3): 40 mg via ORAL
  Filled 2011-11-16: qty 14
  Filled 2011-11-16 (×2): qty 1
  Filled 2011-11-16: qty 14
  Filled 2011-11-16 (×3): qty 1

## 2011-11-16 NOTE — Progress Notes (Signed)
[facility] Group Notes:  (Counselor/Nursing/MHT/Case Management/Adjunct) 11/16/2011  1:15pm-2:30pm Overcoming Obstacles to Wellness   Type of Therapy:  Group Therapy  Participation Level:  Did Not Attend     Regina Alexander, LCSW 11/16/2011  3:56 PM 

## 2011-11-16 NOTE — Progress Notes (Signed)
[facility] Group Notes:  (Counselor/Nursing/MHT/Case Management/Adjunct)  11/16/2011 2:17 AM  Type of Therapy:  Psychoeducational Skills  Participation Level:  Active  Participation Quality:  Appropriate  Affect:  Appropriate  Cognitive:  Appropriate  Insight:  Good  Engagement in Group:  Good  Engagement in Therapy:  Good  Modes of Intervention:  Support  Summary of Progress/Problems: Pt stated that her goal this week was to have her medications adjusted in such a way that would allow her to stay awake for more of the day.   Christ Kick 11/16/2011, 2:17 AM

## 2011-11-16 NOTE — Progress Notes (Signed)
SW met with pt individually at this time.  Pt states that she came to the hospital due to depression and SI.  Pt rates depression at a 6 and anxiety at a 3-4 today.  Pt denies SI today and states she has HI sometimes but no one in particular.  Pt states that she uses alcohol, THC and cocaine.  Pt states that she has not been to a psychiatrist or therapist.  Pt states that she was living alone at the [facility] towers before admission but plans to stay with her daughter after d/c.  Pt states that her daughter lives in [place] and will pick her up at d/c.  SW will refer pt to [facility] for medication management and therapy.  No further needs voiced by pt at this time.    Reyes Ivan, LCSWA 11/16/2011  11:45 AM

## 2011-11-16 NOTE — Progress Notes (Signed)
Patient ID: Morgan Bean, female   DOB: 1965-08-06, 46 y.o.   MRN: [id number]   D: Patient has a flat affect on approach. Reports mood better today but still had given it a "6" on scale. Looks depressed when speaking with her. Currently denies any SI/HI tonight. Attending groups. A: Staff will monitor on q 15 minute checks and follow treatments and give medications as ordered. R: Sitting in dayroom prior to group. No complaints at present.

## 2011-11-16 NOTE — Progress Notes (Addendum)
Patient's self inventory sheet, has fair sleep, poor appetite, low energy level, improving attention span.   Rated depression #6, hopelessness zero.  Denied SI.   Has experienced pain and headache in past 24 hours.  Zero pain goal.  Worst pain #4.   Has been working on her discharge plans and how to take better care of herself after discharge.  Patient denied SI and HI.   Denied A/V hallucinations.   Denied pain. Patient stated she did not attend one group this morning and one group this afternoon.   Stated she was tired and needed to rest.   Patient encouraged to attend all groups. Patient encouraged to eat vegetables and fruit, drink a lot of water/fluids to relieve constipation.

## 2011-11-16 NOTE — Progress Notes (Signed)
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   Cocaine Abuse.  The patient was seen today and reports the following:   ADL's: Intact.  Sleep: The patient reports to sleeping well last night. Appetite: The patient reports a decreased appetite today.   Mild>(1-10) >Severe  Hopelessness (1-10): 5  Depression (1-10): 6  Anxiety (1-10): 4  Suicidal Ideation: The patient denies any suicidal ideations today but reports suicidal ideations prior to admission.  Plan: No  Intent: No  Means: No   Homicidal Ideation: The patient adamantly denies any homicidal ideations today.  Plan: No  Intent: No.  Means: No   General Appearance/Behavior: The patient was friendly and cooperative today with this provider. Eye Contact: Good.  Speech: Appropriate in rate and volume with no pressuring of speech noted today.  Motor Behavior: wnl.  Level of Consciousness: Alert and Oriented x 3.  Mental Status: Alert and Oriented x 3.  Mood: Moderately Depressed.  Affect: Moderately Constricted.  Anxiety Level: Mild to moderate anxiety reported today.  Thought Process: wnl. Thought Content: The patient denies any auditory or visual hallucinations or delusional thinking today. Perception: wnl.  Judgment: Fair to Good.  Insight: Fair to Good.  Cognition: Oriented to person, place and time.  Sleep:  Number of Hours: 5.5    Vital Signs:Blood pressure 112/78, pulse 70, temperature 98.8 F (37.1 C), temperature source Oral, resp. rate 16, height 5\' 6"  (1.676 m), weight 76.658 kg (169 lb), last menstrual period 10/22/2011, SpO2 100.00%.  Current Medications: Current Facility-Administered Medications  Medication Dose Route Frequency Provider Last Rate Last Dose  . acetaminophen (TYLENOL) tablet 650 mg  650 mg Oral Q6H PRN Nehemiah Settle, MD   650 mg at 11/15/11 2025  .  albuterol (PROVENTIL HFA;VENTOLIN HFA) 108 (90 BASE) MCG/ACT inhaler 2 puff  2 puff Inhalation Q4H PRN Nehemiah Settle, MD   2 puff at 11/15/11 2024  . alum & mag hydroxide-simeth (MAALOX/MYLANTA) 200-200-20 MG/5ML suspension 30 mL  30 mL Oral Q4H PRN Nehemiah Settle, MD   30 mL at 11/16/11 1250  . amLODipine (NORVASC) tablet 5 mg  5 mg Oral Q2000 Sanjuana Kava, NP   5 mg at 11/15/11 2025  . aspirin chewable tablet 81 mg  81 mg Oral Daily Nehemiah Settle, MD   81 mg at 11/16/11 1610  . chlordiazePOXIDE (LIBRIUM) capsule 25 mg  25 mg Oral Q6H PRN Nehemiah Settle, MD      . citalopram (CELEXA) tablet 40 mg  40 mg Oral Daily Charlaine Utsey D Carrell Rahmani, MD      . cloNIDine (CATAPRES) tablet 0.3 mg  0.3 mg Oral BID Nehemiah Settle, MD   0.3 mg at 11/16/11 1712  . ferrous sulfate tablet 325 mg  325 mg Oral BID WC Nehemiah Settle, MD   325 mg at 11/16/11 1713  . furosemide (LASIX) tablet 20 mg  20 mg Oral Daily Nehemiah Settle, MD   20 mg at 11/16/11 0834  . hydrOXYzine (ATARAX/VISTARIL) tablet 25 mg  25 mg Oral Q6H PRN Nehemiah Settle, MD   25 mg at 11/14/11 2122  . lisinopril (PRINIVIL,ZESTRIL) tablet 20 mg  20 mg Oral Q2000 Sanjuana Kava, NP   20 mg at 11/15/11 2025  . loperamide (IMODIUM) capsule 2-4 mg  2-4 mg Oral PRN Nehemiah Settle, MD      .  loratadine (CLARITIN) tablet 10 mg  10 mg Oral Daily Nehemiah Settle, MD   10 mg at 11/16/11 0834  . magnesium hydroxide (MILK OF MAGNESIA) suspension 30 mL  30 mL Oral Daily PRN Nehemiah Settle, MD   30 mL at 11/16/11 1310  . multivitamin with minerals tablet 1 tablet  1 tablet Oral Daily Nehemiah Settle, MD   1 tablet at 11/16/11 0835  . nicotine (NICODERM CQ - dosed in mg/24 hours) patch 21 mg  21 mg Transdermal Q0600 Nehemiah Settle, MD   21 mg at 11/16/11 0543  . ondansetron (ZOFRAN-ODT) disintegrating tablet 4 mg  4 mg Oral Q6H PRN  Nehemiah Settle, MD      . potassium chloride (K-DUR,KLOR-CON) CR tablet 10 mEq  10 mEq Oral BID Mike Craze, MD   10 mEq at 11/16/11 1713  . thiamine (B-1) injection 100 mg  100 mg Intramuscular Once Nehemiah Settle, MD      . thiamine (VITAMIN B-1) tablet 100 mg  100 mg Oral Daily Nehemiah Settle, MD   100 mg at 11/16/11 0836  . DISCONTD: citalopram (CELEXA) tablet 20 mg  20 mg Oral Daily Sanjuana Kava, NP   20 mg at 11/16/11 1610   Lab Results: No results found for this or any previous visit (from the past 48 hour(s)).  Physical Findings: AIMS: Facial and Oral Movements Muscles of Facial Expression: None, normal Lips and Perioral Area: None, normal Jaw: None, normal Tongue: None, normal,Extremity Movements Upper (arms, wrists, hands, fingers): None, normal Lower (legs, knees, ankles, toes): None, normal, Trunk Movements Neck, shoulders, hips: None, normal, Overall Severity Severity of abnormal movements (highest score from questions above): None, normal Incapacitation due to abnormal movements: None, normal Patient's awareness of abnormal movements (rate only patient's report): No Awareness, Dental Status Current problems with teeth and/or dentures?: No Does patient usually wear dentures?: No  CIWA:  CIWA-Ar Total: 0  COWS:  COWS Total Score: 0   Review of Systems:  Neurological: No headaches, seizures or dizziness reported.  G.I.: The patient denies any constipation or stomach upset today.  Musculoskeletal: The patient denies any musculoskeletal issues today.   Time was spent today discussing with the patient her current symptoms. The patient  states that she is sleeping well and reports a decreased appetite. She reports moderate feelings of sadness, anhedonia and depressed mood. She also reports mild to moderate anxiety symptoms.  She denies any current suicidal or homicidal ideations but states she was experiencing suicidal ideations prior to  admission.  The patient denies any auditory or visual hallucinations or delusional thinking today.   The patient states she feels her current medications are helping and no changes will be made at this time.  Treatment Plan Summary:  1. Daily contact with patient to assess and evaluate symptoms and progress in treatment.  2. Medication management  3. The patient will deny suicidal ideations or homicidal ideations for 48 hours prior to discharge and have a depression and anxiety rating of 3 or less. The patient will also deny any auditory or visual hallucinations or delusional thinking.  4. The patient will deny any symptoms of substance withdrawal at time of discharge.   Plan:  1. Will continue the patient on her current medications as listed above. 2. Laboratory Studies reviewed.  3. Will continue to monitor.   Roxanne Orner 11/16/2011, 7:12 PM

## 2011-11-16 NOTE — Tx Team (Signed)
Interdisciplinary Treatment Plan Update (Adult)  Date:  11/16/2011  Time Reviewed:  11:30 AM   Progress in Treatment: Attending groups: Yes Participating in groups:  Yes Taking medication as prescribed: Yes Tolerating medication:  Yes Family/Significant other contact made:  Counselor assessing for appropriate contact Patient understands diagnosis:  Yes Discussing patient identified problems/goals with staff:  Yes Medical problems stabilized or resolved:  Yes Denies suicidal/homicidal ideation: Yes Issues/concerns per patient self-inventory:  None identified Other: N/A  New problem(s) identified: None Identified  Reason for Continuation of Hospitalization: Anxiety Depression Medication stabilization Suicidal ideation  Interventions implemented related to continuation of hospitalization: mood stabilization, medication monitoring and adjustment, group therapy and psycho education, safety checks q 15 mins  Additional comments: N/A  Estimated length of stay: 3-5 days  Discharge Plan: SW will assess for appropriate referrals.    New goal(s): N/A  Review of initial/current patient goals per problem list:    1.  Goal(s): Reduce depressive symptoms  Met:  No  Target date: by discharge  As evidenced by: Reducing depression from a 10 to a 3 as reported by pt.   2.  Goal (s): Reduce/Eliminate suicidal ideation  Met:  No  Target date: by discharge  As evidenced by: pt reporting no SI.    3.  Goal(s): Reduce anxiety symptoms  Met:  No  Target date: by discharge  As evidenced by: Reduce anxiety from a 10 to a 3 as reported by pt.    Attendees: Patient:  Morgan Bean 11/16/2011 11:32 AM   Family:     Physician:  Franchot Gallo, MD  11/16/2011  11:30 AM   Nursing:   Tacy Learn, RN 11/16/2011 11:31 AM   Case Manager:  Reyes Ivan, LCSWA 11/16/2011  11:30 AM   Counselor:  Veto Kemps, MT-BC 11/16/2011  11:30 AM   Other:  Barrie Folk, RN 11/16/2011  11:30 AM     Other:  Joslyn Devon, RN 11/16/2011 11:32 AM   Other:     Other:      Scribe for Treatment Team:   Carmina Miller, 11/16/2011 , 11:30 AM

## 2011-11-17 DIAGNOSIS — F333 Major depressive disorder, recurrent, severe with psychotic symptoms: Principal | ICD-10-CM

## 2011-11-17 MED ORDER — MAGNESIUM CITRATE PO SOLN
1.0000 | Freq: Once | ORAL | Status: AC
  Administered 2011-11-17: 1 via ORAL

## 2011-11-17 MED ORDER — TRAZODONE HCL 100 MG PO TABS
100.0000 mg | ORAL_TABLET | Freq: Every day | ORAL | Status: DC
  Administered 2011-11-17 – 2011-11-18 (×2): 100 mg via ORAL
  Filled 2011-11-17: qty 14
  Filled 2011-11-17 (×4): qty 1
  Filled 2011-11-17: qty 14

## 2011-11-17 MED ORDER — POLYETHYLENE GLYCOL 3350 17 G PO PACK
17.0000 g | PACK | Freq: Every day | ORAL | Status: DC
  Administered 2011-11-19: 17 g via ORAL
  Filled 2011-11-17 (×5): qty 1

## 2011-11-17 NOTE — Progress Notes (Signed)
Patient's self inventory sheet, patient has poor sleep, poor appetite, low energy level, good attention span.   Rated depression #6, hopelessness #3.  Denied withdrawals.   SI off/on, contracts for safety.  Has had headache and constipation in past 24 hours.  Pain goal #1, worst pan #5 in past 24 hours.  Does not know what to do to change after discharge.  "I want to know if the cafeteria has prunes/prune juice.  Does have discharge plans.  Need financial help with meds after discharge. Patient has had large BM this afternoon.  Cafeteria does not have prunes but does have prune juice and patient was informed.   Prunes not needed after BM per patient. Patient has been cooperative and pleasant.  Denied SI and HI while talking to nurse.   Contracts for safety.   Denied A/V hallucinations.   Denied pain.

## 2011-11-17 NOTE — Progress Notes (Signed)
Psychoeducational Group Note  Date:  11/17/2011 Time:  2000  Group Topic/Focus:  Wrap-Up Group:   The focus of this group is to help patients review their daily goal of treatment and discuss progress on daily workbooks.  Participation Level:  Minimal  Participation Quality:  Appropriate  Affect:  Appropriate  Cognitive:  Appropriate  Insight:  Good  Engagement in Group:  Good  Additional Comments:  Patient attended and participated in group tonight. She reports that she fell well today. She reports that she has been feeling sleepy because she started back on her medication and it is getting adjusted to her body again. Pt advised that she attends church for her spiritual wellness.  Lita Mains [facility] 11/17/2011, 12:06 AM

## 2011-11-17 NOTE — Progress Notes (Signed)
Psychoeducational Group Note  Date:  11/17/2011 Time:  2000  Group Topic/Focus:  Wrap-Up Group:   The focus of this group is to help patients review their daily goal of treatment and discuss progress on daily workbooks.  Participation Level:  Active  Participation Quality:  Appropriate, Attentive, Sharing and Supportive  Affect:  Depressed and Flat  Cognitive:  Appropriate  Insight:  Good  Engagement in Group:  Good  Additional Comments:  Patient attended and participated in wrap-up group this evening. Patient shared that she gained much insight from the groups she attended today and was able to reflect on her situation because of what other patients shared.  Briza Bark, Newton Pigg 11/17/2011, 8:29 PM

## 2011-11-17 NOTE — Progress Notes (Signed)
[facility] Group Notes:  (Counselor/Nursing/MHT/Case Management/Adjunct)  11/17/2011 1:37 PM  Type of Therapy:   Recovery Goals: The focus of this group is to identify appropriate goals for recovery and and to establish a plan to achieve them.   Participation Level:  Did Not Attend    Summary of Progress/Problems: Pt. Did not attend.   Ardelle Park O 11/17/2011, 1:37 PM

## 2011-11-17 NOTE — Progress Notes (Signed)
Pt attended discharge planning group and actively participated in group.  SW provided pt with today's workbook.  Pt presents with flat affect and depressed mood.  Pt rates depression and anxiety at a 6 today.  Pt denies SI.  Pt states that she wants to go home and do outpatient services.  Pt states that she doesn't feel she needs rehab.  No further needs voiced by pt at this time.  Safety planning and suicide prevention discussed.  Pt participated in discussion and acknowledged an understanding of the information provided.       Reyes Ivan, LCSWA 11/17/2011  10:06 AM

## 2011-11-17 NOTE — Progress Notes (Signed)
Psychoeducational Group Note  Date:  11/17/2011 Time:  1000  Group Topic/Focus:  Therapeutic Activity- "Apples to Apples"  Participation Level: Did Not Attend  Participation Quality:  Not Applicable  Affect:  Not Applicable  Cognitive:  Not Applicable  Insight:  Not Applicable  Engagement in Group: Not Applicable  Additional Comments:  Pt did not attend group but remained resting in bed.   Sharyn Lull 11/17/2011, 10:39 AM

## 2011-11-17 NOTE — Progress Notes (Signed)
[facility] MD Progress Note  11/17/2011 2:29 PM  S: "I don't feel well. My stomach hurts.  My bowels would not move for the past 5 days. I am getting sick. I could not eat breakfast. I am feeling sad about being in this hospital. This is a psychiatric hospital. I never thought that I will ever see myself in this kind of situation.".  Diagnosis:   Axis I: Major depressive disorder, recurrent episode with psychotic features. Axis II: Deferred Axis III:  Past Medical History  Diagnosis Date  . Coronary artery disease     Lexiscan myoview (10/12) with significant ST depression upon Lexiscan injection but no ischemia or infarction on perfusion images. Left heart cath (10/12): 30% mid LAD, 30% ostial D1, 50% ostial D2.    . Stroke   . Hypertension   . Tricuspid regurgitation   . Iron deficiency     hx of  . Polysubstance abuse   . Tobacco abuse   . Leukocytoclastic vasculitis   . Pulmonary HTN     Echo (9/11) with EF 65%, mild LVH, mild AI, mild MR, moderate to possibly severe TR with PA systolic pressure 52 mmHg. Echo (10/12): severe LV hypertrophy, EF 60-65%, mild MR, mild AI, moderate to severe tricuspid regurgitation, PA systolic pressure 38 mmHg.    . Asthma   . CHF (congestive heart failure)    Axis IV: other psychosocial or environmental problems Axis V: 50  ADL's:  Intact  Sleep: Poor  Appetite:  Poor "I don't feel like eating, my bowels stopped up"  Suicidal Ideation: "No" Plan:  No Intent:  no Means:  No Homicidal Ideation: "No" Plan:  No Intent:  no Means:  no  AEB (as evidenced by): per patient's reports.  Mental Status Examination/Evaluation: Objective:  Appearance: Casual and appears uncomfortable.  Eye Contact::  Good  Speech:  Clear and Coherent  Volume:  Normal  Mood:  "I'm sad but not depressed"  Affect:  Flat  Thought Process:  Coherent  Orientation:  Full  Thought Content:  Rumination  Suicidal Thoughts:  No  Homicidal Thoughts:  No  Memory:   Immediate;   Good Recent;   Good Remote;   Good  Judgement:  Good  Insight:  Good  Psychomotor Activity:  Normal  Concentration:  Poor  Recall:  Good  Akathisia:  No  Handed:  Right  AIMS (if indicated):     Assets:  Desire for Improvement  Sleep:  Number of Hours: 6    Vital Signs:Blood pressure 130/96, pulse 83, temperature 98.3 F (36.8 C), temperature source Oral, resp. rate 20, height 5\' 6"  (1.676 m), weight 76.658 kg (169 lb), last menstrual period 10/22/2011, SpO2 100.00%. Current Medications: Current Facility-Administered Medications  Medication Dose Route Frequency Provider Last Rate Last Dose  . acetaminophen (TYLENOL) tablet 650 mg  650 mg Oral Q6H PRN Nehemiah Settle, MD   650 mg at 11/15/11 2025  . albuterol (PROVENTIL HFA;VENTOLIN HFA) 108 (90 BASE) MCG/ACT inhaler 2 puff  2 puff Inhalation Q4H PRN Nehemiah Settle, MD   2 puff at 11/15/11 2024  . alum & mag hydroxide-simeth (MAALOX/MYLANTA) 200-200-20 MG/5ML suspension 30 mL  30 mL Oral Q4H PRN Nehemiah Settle, MD   30 mL at 11/16/11 1250  . amLODipine (NORVASC) tablet 5 mg  5 mg Oral Q2000 Sanjuana Kava, NP   5 mg at 11/16/11 1944  . aspirin chewable tablet 81 mg  81 mg Oral Daily Nehemiah Settle, MD  81 mg at 11/17/11 0810  . chlordiazePOXIDE (LIBRIUM) capsule 25 mg  25 mg Oral Q6H PRN Nehemiah Settle, MD      . citalopram (CELEXA) tablet 40 mg  40 mg Oral Daily Curlene Labrum Readling, MD   40 mg at 11/17/11 0811  . cloNIDine (CATAPRES) tablet 0.3 mg  0.3 mg Oral BID Nehemiah Settle, MD   0.3 mg at 11/17/11 0811  . ferrous sulfate tablet 325 mg  325 mg Oral BID WC Nehemiah Settle, MD   325 mg at 11/17/11 0814  . furosemide (LASIX) tablet 20 mg  20 mg Oral Daily Nehemiah Settle, MD   20 mg at 11/17/11 0814  . hydrOXYzine (ATARAX/VISTARIL) tablet 25 mg  25 mg Oral Q6H PRN Nehemiah Settle, MD   25 mg at 11/14/11 2122  . lisinopril  (PRINIVIL,ZESTRIL) tablet 20 mg  20 mg Oral Q2000 Sanjuana Kava, NP   20 mg at 11/16/11 1944  . loperamide (IMODIUM) capsule 2-4 mg  2-4 mg Oral PRN Nehemiah Settle, MD      . loratadine (CLARITIN) tablet 10 mg  10 mg Oral Daily Nehemiah Settle, MD   10 mg at 11/17/11 0813  . magnesium hydroxide (MILK OF MAGNESIA) suspension 30 mL  30 mL Oral Daily PRN Nehemiah Settle, MD   30 mL at 11/16/11 1310  . multivitamin with minerals tablet 1 tablet  1 tablet Oral Daily Nehemiah Settle, MD   1 tablet at 11/17/11 0813  . nicotine (NICODERM CQ - dosed in mg/24 hours) patch 21 mg  21 mg Transdermal Q0600 Nehemiah Settle, MD   21 mg at 11/17/11 8119  . ondansetron (ZOFRAN-ODT) disintegrating tablet 4 mg  4 mg Oral Q6H PRN Nehemiah Settle, MD      . potassium chloride (K-DUR,KLOR-CON) CR tablet 10 mEq  10 mEq Oral BID Mike Craze, MD   10 mEq at 11/17/11 1478  . thiamine (B-1) injection 100 mg  100 mg Intramuscular Once Nehemiah Settle, MD      . thiamine (VITAMIN B-1) tablet 100 mg  100 mg Oral Daily Nehemiah Settle, MD   100 mg at 11/17/11 0815  . DISCONTD: citalopram (CELEXA) tablet 20 mg  20 mg Oral Daily Sanjuana Kava, NP   20 mg at 11/16/11 2956    Lab Results: No results found for this or any previous visit (from the past 48 hour(s)).  Physical Findings: AIMS: Facial and Oral Movements Muscles of Facial Expression: None, normal Lips and Perioral Area: None, normal Jaw: None, normal Tongue: None, normal,Extremity Movements Upper (arms, wrists, hands, fingers): None, normal Lower (legs, knees, ankles, toes): None, normal, Trunk Movements Neck, shoulders, hips: None, normal, Overall Severity Severity of abnormal movements (highest score from questions above): None, normal Incapacitation due to abnormal movements: None, normal Patient's awareness of abnormal movements (rate only patient's report): No Awareness, Dental  Status Current problems with teeth and/or dentures?: No Does patient usually wear dentures?: No  CIWA:  CIWA-Ar Total: 0  COWS:  COWS Total Score: 0   Treatment Plan Summary: Daily contact with patient to assess and evaluate symptoms and progress in treatment Medication management  Plan: Magnesium citrate 1 bottle x once today. Miralax 17 g daily. Start Trazodone 100 mg Q bedtime for sleep.   Armandina Stammer I 11/17/2011, 2:29 PM

## 2011-11-17 NOTE — Progress Notes (Signed)
Pt observed sitting in the dayroom watching TV.  She reports that she had been constipated for the last few days, but was given MagCit this afternoon with good results.  She reports this has made her feel better.  She is going to attend group tonight on the 500 hall.  She denies SI/HI/AV with this Clinical research associate, but does have SI that comes and goes.  She says she is still depressed.  She does appear flat/sad.  She states she wants to discharge home and do outpatient.  She says she needs help getting her meds.  Pt is encouraged to make her needs known to staff.  Pt voices understanding.  Safety maintained with q15 minute checks.

## 2011-11-17 NOTE — Progress Notes (Signed)
[facility] Group Notes: (Counselor/Nursing/MHT/Case Management/Adjunct) 11/17/2011   @  11:00am  Finding Balance in Life  Type of Therapy:  Group Therapy  Participation Level:  Minimal  Participation Quality: Attentive   Affect:  Blunted  Cognitive:  Appropriate  Insight:  None  Engagement in Group: Minimal  Engagement in Therapy: None   Modes of Intervention:  Support and Exploration  Summary of Progress/Problems: Danniell  was attentive but not engaged in group process   [name] 11/17/2011   2:42 PM

## 2011-11-18 MED ORDER — ADULT MULTIVITAMIN W/MINERALS CH: 1.0000 | ORAL_TABLET | Freq: Every day | ORAL | Status: DC

## 2011-11-18 MED ORDER — POTASSIUM CHLORIDE CRYS ER 10 MEQ PO TBCR: 10.0000 meq | EXTENDED_RELEASE_TABLET | ORAL | Status: DC

## 2011-11-18 MED ORDER — POLYETHYLENE GLYCOL 3350 17 G PO PACK: 17.0000 g | PACK | Freq: Every day | ORAL | Status: AC

## 2011-11-18 MED ORDER — LISINOPRIL 20 MG PO TABS: 20.0000 mg | ORAL_TABLET | Freq: Every day | ORAL | Status: DC

## 2011-11-18 MED ORDER — CLONIDINE HCL 0.3 MG PO TABS: 0.3000 mg | ORAL_TABLET | ORAL | Status: DC

## 2011-11-18 MED ORDER — LORATADINE 10 MG PO TABS: 10.0000 mg | ORAL_TABLET | Freq: Every day | ORAL | Status: DC

## 2011-11-18 MED ORDER — AMLODIPINE BESYLATE 5 MG PO TABS: 5.0000 mg | ORAL_TABLET | Freq: Every day | ORAL | Status: DC

## 2011-11-18 MED ORDER — FERROUS SULFATE 325 (65 FE) MG PO TABS: 325.0000 mg | ORAL_TABLET | Freq: Two times a day (BID) | ORAL | Status: DC

## 2011-11-18 MED ORDER — FUROSEMIDE 20 MG PO TABS: 20.0000 mg | ORAL_TABLET | Freq: Every day | ORAL | Status: DC

## 2011-11-18 MED ORDER — ASPIRIN 81 MG PO TABS: 81.0000 mg | ORAL_TABLET | Freq: Every day | ORAL | Status: DC

## 2011-11-18 MED ORDER — ALBUTEROL SULFATE HFA 108 (90 BASE) MCG/ACT IN AERS: 2.0000 | INHALATION_SPRAY | RESPIRATORY_TRACT | Status: DC | PRN

## 2011-11-18 MED ORDER — CITALOPRAM HYDROBROMIDE 40 MG PO TABS: 40.0000 mg | ORAL_TABLET | Freq: Every day | ORAL | Status: DC

## 2011-11-18 MED ORDER — TRAZODONE HCL 100 MG PO TABS: 100.0000 mg | ORAL_TABLET | Freq: Every day | ORAL | Status: DC

## 2011-11-18 NOTE — Progress Notes (Signed)
11/18/2011         Time: 1500      Group Topic/Focus: The focus of this group is on enhancing the patient's understanding of leisure, barriers to leisure, and the importance of engaging in positive leisure activities upon discharge for improved total health.  Participation Level: Active  Participation Quality: Attentive  Affect: Blunted  Cognitive: Oriented   Additional Comments: Patient isolative, sat away from the rest of the group, but participated with encouragement.  Shriyan Arakawa 11/18/2011 3:36 PM

## 2011-11-18 NOTE — Progress Notes (Signed)
D: Patient denies SI/HI and auditory and visual hallucinations. The patient rates her depression a 3 out of 10 and her hopelessness a 1 out of 10 (1 low/10 high). The patient reports sleeping well and having a poor appetite and energy level. The patient isolates on the unit and doesn't attend groups. The patient reports "just feeling bad."  A: Patient given emotional support from RN. Patient encouraged to come to staff with concerns and/or questions. Patient's medication routine continued. Patient's orders and plan of care reviewed. Patient was also given education regarding the importance of groups and group participation.  R: Patient remains appropriate and cooperative but still isolates to her room. Will continue to monitor patient q15 minutes for safety.

## 2011-11-18 NOTE — Progress Notes (Addendum)
Pt attended discharge planning group and actively participated in group.  SW provided pt with today's workbook.  Pt presents with calm mood and affect.  Pt rates depression at a 1 and anxiety at a 4 today.  Pt denies SI/HI.  Pt reports feeling ready to d/c soon.  Pt states that she feels a lot better.  SW asked pt if she knew anything about her daughter saying she isn't able to return home unless she goes to long term treatment, as weekend SW made contact with daughter over the weekend.  Pt states that she knew nothing about this condition to come home.  Pt states that she will not go to long term treatment after d/c because she doesn't feel like she needs it.  SW contacted pt's daughter on this date to discuss this.  Pt's daughter was adamant pt is not allowed to come to her house unless she gets treatment.  Pt's daughter states that pt knows this and she will not budge on this condition.  SW informed pt about the conversation.  Pt continues to state that she refuses to go to long term treatment and would prefer to stay at the homeless shelter and follow up at [facility].  Pt explained that she has her own apartment but won't be able to afford next month's rent.  Pt states that she plans to d/c home to her apartment for now.  SW will secure pt's follow up.  SW provided pt with a bus pass.  No further needs voiced by pt at this time.    Reyes Ivan, LCSWA 11/18/2011  10:36 AM

## 2011-11-18 NOTE — Progress Notes (Signed)
Psychoeducational Group Note  Date:  11/18/2011 Time:  2000  Group Topic/Focus:  Wrap-Up Group:   The focus of this group is to help patients review their daily goal of treatment and discuss progress on daily workbooks.  Participation Level:  Active  Participation Quality:  Appropriate  Affect:  Appropriate  Cognitive:  Alert  Insight:  Good  Engagement in Group:  Good  Additional Comments:  Pt stated that she had a good day.   Kaleen Odea R 11/18/2011, 10:07 PM

## 2011-11-18 NOTE — Progress Notes (Signed)
[facility] Group Notes:  (Counselor/Nursing/MHT/Case Management/Adjunct)  11/18/2011 11:34 AM  Type of Therapy:  Psychoeducational Skills  Participation Level:  Minimal  Participation Quality:  Appropriate and Attentive  Affect:  Appropriate  Cognitive:  Alert, Appropriate and Oriented  Insight:  Good  Engagement in Group:  Good  Engagement in Therapy:  bn/a  Modes of Intervention:  Activity, Education, Problem-solving, Socialization and Support  Summary of Progress/Problems: Nelda attended Psychoeducational group that focused on using quality time with support systems/individuals to engage in healthy coping skills. Raziyah participated in activity guessing about self and peers. Timarie was quiet but active while group discussed who their supports are, how they can spend positive quality time with them as a coping skill and a way to strengthen their relationship. Denesha was given a homework assignment to find two ways to improve her support systems and 20 activities she can do to spend quality time with supports.    Wandra Scot 11/18/2011, 11:34 AM

## 2011-11-18 NOTE — Progress Notes (Signed)
[facility] Group Notes: (Counselor/Nursing/MHT/Case Management/Adjunct) 11/18/2011   1:15-2:30pm Emotion Regulation  Type of Therapy:  Group Therapy  Participation Level:  Limited  Participation Quality:  Attentive, Supportive  Affect:  Blunted  Cognitive:  Appropriate  Insight:  Limited  Engagement in Group:  Limited  Engagement in Therapy:  Limited  Modes of Intervention:  Support and Exploration  Summary of Progress/Problems: Morgan Bean was attentive and appropriate, and seemed to relate to the statements of others, though she did not share her own experiences. She indicated that she finds herself being overwhelmed by anger and sadness at times, and talked about how those emotions feel in her body. [name] was supportive and encouraging to other group members who shared their stories of suffering.  [name], LCSW 11/18/2011  2:33 PM

## 2011-11-18 NOTE — Progress Notes (Signed)
Psychoeducational Group Note  Date:  11/18/2011 Time:  1100   Group Topic/Focus:  Goals Group:   The focus of this group is to help patients establish daily goals to achieve during treatment and discuss how the patient can incorporate goal setting into their daily lives to aide in recovery.  Participation Level:  Active  Participation Quality:  Appropriate  Affect:  Appropriate  Cognitive:  Appropriate  Insight:  Good  Engagement in Group:  Good  Additional Comments:pt attended very little of group. Pt received the handout in regards to values.  Lenox Ahr 11/18/2011, 2:02 PM

## 2011-11-18 NOTE — [facility] Suicide Risk Assessment (Signed)
Suicide Risk Assessment  Discharge Assessment     Demographic factors:  Low socioeconomic status Unemployed  Current Mental Status Per Nursing Assessment::   On Admission:  Suicidal ideation indicated by patient At Discharge: Time was spent today discussing with the patient her current symptoms. The patient states that she is sleeping well and reports a slight decrease in appetite. She reports very mild feelings of sadness, anhedonia and depressed mood as well as very mild anxiety symptoms. She adamantly denies any current suicidal or homicidal ideations as well as adamantly denies any auditory or visual hallucinations or delusional thinking today.   The patient states that she feels ready for discharge and will be discharged in the morning after samples of medications and follow up appointments are arranged.  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   Cocaine Abuse.   The patient was seen today and reports the following:   ADL's: Intact.  Sleep: The patient reports to sleeping well last night.  Appetite: The patient reports a decreased appetite today.   Mild>(1-10) >Severe  Hopelessness (1-10): 0  Depression (1-10): 2  Anxiety (1-10): 2   Suicidal Ideation: The patient adamantly denies any suicidal ideations today.  Plan: No  Intent: No  Means: No   Homicidal Ideation: The patient adamantly denies any homicidal ideations today.  Plan: No  Intent: No.  Means: No   General Appearance/Behavior: The patient was friendly and cooperative today with this provider.  Eye Contact: Good.  Speech: Appropriate in rate and volume with no pressuring of speech noted today.  Motor Behavior: wnl.  Level of Consciousness: Alert and Oriented x 3.  Mental Status: Alert and Oriented x 3.  Mood: Very mildly depressed.  Affect: Very mildly constricted.  Anxiety Level: Very mild anxiety reported today.    Thought Process: wnl.  Thought Content: The patient denies any auditory or visual hallucinations or delusional thinking today.  Perception: wnl.  Judgment: Fair to Good.  Insight: Fair to Good.  Cognition: Oriented to person, place and time.   Loss Factors: Decrease in vocational status;Loss of significant relationship Homelessness.  Corporate treasurer.  Historical Factors: Impulsivity  History of substance abuse.  Long history of mental illness.  Risk Reduction Factors:   Living with another person, especially a relative  Good access to community services.  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    Cocaine Abuse.  AXIS II:   Deferred. AXIS III:   1.  Coronary artery disease    2.  Lexiscan myoview (10/12) with significant ST depression upon Lexiscan injection but no ischemia or infarction on perfusion images. Left heart cath (10/12): 30% mid         LAD, 30% ostial D1, 50% ostial D2.     3.  History of Cerebral Vascular Accident.    4.  Hypertension    5.  Tricuspid regurgitation    6.  Iron deficiency Anemia.    7.  Leukocytoclastic vasculitis    8.  Pulmonary HTN    9.  Echo (9/11) with EF 65%, mild LVH, mild AI, mild MR, moderate to possibly severe TR with PA systolic pressure 52 mmHg. Echo (10/12): severe LV hypertrophy, EF  60-65%, mild MR, mild AI, moderate to severe tricuspid regurgitation, PA systolic pressure 38 mmHg.     10. Asthma    11. CHF (congestive heart failure)  AXIS IV:   Multiple Serious Non-psychiatric Medical Issues.  Substance Abuse Issues.  Homelessness.  Corporate treasurer.  Family Conflicts. AXIS V:   GAF at time of admission approximately 35.  GAF at time of discharge  approximately 50.  Cognitive Features That Contribute To Risk:  None Noted.    Current Medications:  Current Facility-Administered Medications   Medication  Dose  Route  Frequency  Provider  Last Rate  Last Dose   .  acetaminophen (TYLENOL) tablet 650 mg  650 mg  Oral  Q6H PRN  Nehemiah Settle, MD   650 mg at 11/15/11 2025   .  albuterol (PROVENTIL HFA;VENTOLIN HFA) 108 (90 BASE) MCG/ACT inhaler 2 puff  2 puff  Inhalation  Q4H PRN  Nehemiah Settle, MD   2 puff at 11/15/11 2024   .  alum & mag hydroxide-simeth (MAALOX/MYLANTA) 200-200-20 MG/5ML suspension 30 mL  30 mL  Oral  Q4H PRN  Nehemiah Settle, MD   30 mL at 11/16/11 1250   .  amLODipine (NORVASC) tablet 5 mg  5 mg  Oral  Q2000  Sanjuana Kava, NP   5 mg at 11/15/11 2025   .  aspirin chewable tablet 81 mg  81 mg  Oral  Daily  Nehemiah Settle, MD   81 mg at 11/16/11 4098   .  chlordiazePOXIDE (LIBRIUM) capsule 25 mg  25 mg  Oral  Q6H PRN  Nehemiah Settle, MD     .  citalopram (CELEXA) tablet 40 mg  40 mg  Oral  Daily  Jeiry Birnbaum D Shadee Rathod, MD     .  cloNIDine (CATAPRES) tablet 0.3 mg  0.3 mg  Oral  BID  Nehemiah Settle, MD   0.3 mg at 11/16/11 1712   .  ferrous sulfate tablet 325 mg  325 mg  Oral  BID WC  Nehemiah Settle, MD   325 mg at 11/16/11 1713   .  furosemide (LASIX) tablet 20 mg  20 mg  Oral  Daily  Nehemiah Settle, MD   20 mg at 11/16/11 0834   .  hydrOXYzine (ATARAX/VISTARIL) tablet 25 mg  25 mg  Oral  Q6H PRN  Nehemiah Settle, MD   25 mg at 11/14/11 2122   .  lisinopril (PRINIVIL,ZESTRIL) tablet 20 mg  20 mg  Oral  Q2000  Sanjuana Kava, NP   20 mg at 11/15/11 2025   .  loperamide (IMODIUM) capsule 2-4 mg  2-4 mg  Oral  PRN  Nehemiah Settle, MD     .  loratadine (CLARITIN) tablet 10 mg  10 mg  Oral  Daily  Nehemiah Settle, MD   10 mg at 11/16/11 0834   .  magnesium hydroxide (MILK OF MAGNESIA) suspension 30 mL  30 mL  Oral   Daily PRN  Nehemiah Settle, MD   30 mL at 11/16/11 1310   .  multivitamin with minerals tablet 1 tablet  1 tablet  Oral  Daily  Nehemiah Settle, MD   1 tablet at 11/16/11 0835   .  nicotine (NICODERM CQ - dosed in mg/24 hours) patch 21 mg  21 mg  Transdermal  Q0600  Nehemiah Settle, MD   21 mg at 11/16/11 0543   .  ondansetron (ZOFRAN-ODT) disintegrating tablet 4 mg  4 mg  Oral  Q6H PRN  Nehemiah Settle, MD     .  potassium chloride (K-DUR,KLOR-CON) CR tablet 10 mEq  10 mEq  Oral  BID  Mike Craze, MD   10 mEq at 11/16/11 1713   .  thiamine (B-1) injection 100 mg  100 mg  Intramuscular  Once  Nehemiah Settle, MD     .  thiamine (VITAMIN B-1) tablet 100 mg  100 mg  Oral  Daily  Nehemiah Settle, MD   100 mg at 11/16/11 0836   .  DISCONTD: citalopram (CELEXA) tablet 20 mg  20 mg  Oral  Daily  Sanjuana Kava, NP   20 mg at 11/16/11 1610    Lab Results: No results found for this or any previous visit (from the past 48 hour(s)).   Physical Findings:  AIMS: Facial and Oral Movements  Muscles of Facial Expression: None, normal  Lips and Perioral Area: None, normal  Jaw: None, normal  Tongue: None, normal,Extremity Movements  Upper (arms, wrists, hands, fingers): None, normal  Lower (legs, knees, ankles, toes): None, normal, Trunk Movements  Neck, shoulders, hips: None, normal, Overall Severity  Severity of abnormal movements (highest score from questions above): None, normal  Incapacitation due to abnormal movements: None, normal  Patient's awareness of abnormal movements (rate only patient's report): No Awareness, Dental Status  Current problems with teeth and/or dentures?: No  Does patient usually wear dentures?: No  CIWA: CIWA-Ar Total: 0  COWS: COWS Total Score: 0   Review of Systems:  Neurological: No headaches, seizures or dizziness reported.  G.I.: The patient denies any constipation or stomach upset today.  Musculoskeletal:  The patient denies any musculoskeletal issues today.   Time was spent today discussing with the patient her current symptoms. The patient states that she is sleeping well and reports a slight decrease in appetite. She reports very mild feelings of sadness, anhedonia and depressed mood as well as very mild anxiety symptoms. She adamantly denies any current suicidal or homicidal ideations as well as adamantly denies any auditory or visual hallucinations or delusional thinking today.   The patient states that she feels ready for discharge and will be discharged in the morning after samples of medications and follow up appointments are arranged.  Treatment Plan Summary:  1. Daily contact with patient to assess and evaluate symptoms and progress in treatment.  2. Medication management  3. The patient will deny suicidal ideations or homicidal ideations for 48 hours prior to discharge and have a depression and anxiety rating of 3 or less. The patient will also deny any auditory or visual hallucinations or delusional thinking.  4. The patient will deny any symptoms of substance withdrawal at time of discharge.   Plan:  1. Will continue the patient on her current medications as listed above.  2. Laboratory Studies reviewed.  3. Will continue to monitor.  4. The patient will be discharged as requested in the morning to outpatient follow up.  Suicide Risk:  Minimal: No identifiable suicidal ideation.  Patients presenting with no risk factors but with morbid ruminations; may be classified as minimal risk based on the severity of the depressive symptoms  Plan Of Care/Follow-up recommendations:  Activity:  As tolerated. Diet:  Low Sodium, Heart Healthy Diet. Other:  Please take all medications only as directed and keep all scheduled follow up appointments.  Please return to your local Emergency Room if you  have a return of any thoughts of harming yourself or others.  Teira Arcilla 11/18/2011, 4:18 PM

## 2011-11-19 NOTE — Progress Notes (Signed)
Pt observed in her room at this time.  Earlier shift RN reported pt had spent most of the day in her room.  Pt reports she is being discharged tomorrow to home.  She told this Clinical research associate that she may go to another facility, but that she had to take care of things at home first.  She said she could not afford the place where she lived now and would have to move.  When asked, she said she had help with her expected move.  She is going to follow up with outpatient.  She denies SI/HI/AV to this Clinical research associate.  She voices no needs/concerns at this time.  Support/encouragement given.  Safety maintained with q15 minute checks.

## 2011-11-19 NOTE — Progress Notes (Signed)
Patient ID: Morgan Bean, female   DOB: 1965-12-28, 46 y.o.   MRN: [id number] D: Pt. denies lethality and A/V/H's or other problems.  Pt. states she is looking forward to discharge today.  Pt. states she is planning to leave by bus, after her mother delivers some clothing and personal items to her this afternoon.   A: Pt. Is stable and ready for discharge.  R: Pt. Given discharge instructions, bus ticket, prescriptions and medications to take with and her belongings were returned to her.  Pt. States she understands her instructions and signed her release of information to her next provider. Pt. was accompanied ambulatory to the lobby carrying her bags of belongings and was observed leaving the building.

## 2011-11-19 NOTE — Progress Notes (Signed)
Patient ID: Morgan Bean, female   DOB: [birth year], 46 y.o.   MRN: [id number]

## 2011-11-19 NOTE — Progress Notes (Signed)
[facility] Case Management Discharge Plan:  Will you be returning to the same living situation after discharge: Yes,  return to own home At discharge, do you have transportation home?:Yes,  provided pt with a bus pass Do you have the ability to pay for your medications:Yes,  access to meds  Release of information consent forms completed and in the chart;  Patient's signature needed at discharge.  Patient to Follow up at:  Follow-up Information    Follow up with [facility] on 11/25/2011. (Appointment scheduled at 8:00 am with Dr. Eulah Pont)    Contact information:   [address] [place], [place] [address] [phone]         Patient denies SI/HI:   Yes,  denies SI/HI today    Safety Planning and Suicide Prevention discussed:  Yes,  discussed with pt today  Barrier to discharge identified:No.  Summary and Recommendations: Pt attended discharge planning group and actively participated in group.  SW provided pt with today's workbook.  Pt presents with calm mood and affect.  Pt rates depression at a 0 and anxiety at a 3-4 today. Pt denies SI/HI.  Pt reports feeling stable to d/c today.  No recommendations from SW.  No further needs voiced by pt.  Pt stable to discharge.     Carmina Miller 11/19/2011, 9:35 AM

## 2011-11-19 NOTE — Progress Notes (Signed)
        [facility] Group Notes: (Counselor/Nursing/MHT/Case Management/Adjunct) 11/19/2011   @1 :15pm [facility]  Type of Therapy:  Group Therapy  Participation Level:  Good  Participation Quality:  Good  Affect:  Appropriate  Cognitive:  Appropriate  Insight:  Good  Engagement in Group:  Good  Engagement in Therapy:  Good  Modes of Intervention:  Support and Exploration  Summary of Progress/Problems:  Morgan Bean participated with speaker from [facility] [place] and expressed interest in programs [facility] offers. She seemed to relate well with speaker, and stated that she believed the services offered by [facility] will be very important to her recovery.   [name] 11/19/2011  4:00 PM

## 2011-11-19 NOTE — Tx Team (Signed)
Interdisciplinary Treatment Plan Update (Adult)  Date:  11/19/2011  Time Reviewed:  10:18 AM   Progress in Treatment: Attending groups: Yes Participating in groups:  Yes Taking medication as prescribed: Yes Tolerating medication:  Yes Family/Significant othe contact made:  Yes, daughter wants pt to go for rehab, pt refuses.  Patient understands diagnosis:  Yes Discussing patient identified problems/goals with staff:  Yes Medical problems stabilized or resolved:  Yes Denies suicidal/homicidal ideation: Yes Issues/concerns per patient self-inventory:  None identified Other: N/A  New problem(s) identified: None Identified  Reason for Continuation of Hospitalization: Stable to d/c  Interventions implemented related to continuation of hospitalization: Stable to d/c  Additional comments: N/A  Estimated length of stay: D/C today  Discharge Plan: Pt will follow up with [facility] for medication management and therapy.    New goal(s): N/A  Review of initial/current patient goals per problem list:   1.  Goal(s): Reduce depressive symptoms  Met:  Yes  Target date: by discharge  As evidenced by: Reducing depression from a 10 to a 3 as reported by pt. Pt rates at a 0 today.    2.  Goal (s): Eliminate Suicidal Ideation  Met:  Yes  Target date: by discharge  As evidenced by: Eliminate suicidal ideation.   3.  Goal(s): Reduce anxiety symptoms  Met:  Yes  Target date: by discharge  As evidenced by: Reducing depression from a 10 to a 3 as reported by pt. Pt rates at a 3 today.    Attendees: Patient:  Morgan Bean 11/19/2011 10:45 AM   Family:     Physician:  Franchot Gallo, MD 11/19/2011 10:45 AM   Nursing:   Joslyn Devon, RN 11/19/2011 10:45 AM   Case Manager:  Reyes Ivan, LCSWA 11/19/2011 10:45 AM   Counselor:  Veto Kemps, MT-BC 11/19/2011 10:45 AM   Other: Barrie Folk, RN 11/19/2011 10:44 AM   Other:  Abbie Sons, RN 11/19/2011 11:24 AM   Other:     Other:       Scribe for Treatment Team:   Carmina Miller, 11/19/2011, 10:18 AM

## 2011-11-19 NOTE — Progress Notes (Signed)
Pt did not attend evening group tonight.

## 2011-11-20 NOTE — Progress Notes (Signed)
Patient Discharge Instructions:  After Visit Summary (AVS):   Faxed to:  11/20/2011 Psychiatric Admission Assessment Note:   Faxed to:  11/20/2011 Suicide Risk Assessment - Discharge Assessment:   Faxed to:  11/20/2011 Faxed/Sent to the Next Level Care provider:  11/20/2011  Faxed to [facility] @ [phone]  Heloise Purpura Eduard Clos, 11/20/2011, 3:45 PM

## 2011-12-13 NOTE — Discharge Summary (Signed)
Physician Discharge Summary Note  Patient:  Morgan Bean is an 46 y.o., female MRN:  [id number] DOB:  12-20-1965 Patient phone:  [phone] (home)  Patient address:   [address] [place] [address]   Date of Admission:  11/13/2011 Date of Discharge: 11/19/2011  Discharge Diagnoses: Principal Problem:  *Major depressive disorder, recurrent episode with psychotic features. Active Problems:  Cocaine abuse  Axis Diagnosis:  AXIS I: Major Depressive Disorder - Recurrent with Psychotic Features.  Cocaine Abuse.  AXIS II: Deferred.  AXIS III: 1. Coronary artery disease  2. Lexiscan myoview (10/12) with significant ST depression upon Lexiscan injection but no ischemia or infarction on perfusion images. Left heart cath (10/12): 30% mid LAD, 30% ostial D1, 50% ostial D2.  3. History of Cerebral Vascular Accident.  4. Hypertension  5. Tricuspid regurgitation  6. Iron deficiency Anemia.  7. Leukocytoclastic vasculitis  8. Pulmonary HTN  9. Echo (9/11) with EF 65%, mild LVH, mild AI, mild MR, moderate to possibly severe TR with PA systolic pressure 52 mmHg. Echo (10/12): severe LV hypertrophy, EF 60-65%, mild MR, mild AI, moderate to severe tricuspid regurgitation, PA systolic pressure 38 mmHg.  10. Asthma  11. CHF (congestive heart failure)  AXIS IV: Multiple Serious Non-psychiatric Medical Issues. Substance Abuse Issues. 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  AXIS V: GAF at time of admission approximately 35. GAF at time of discharge approximately 50.   Level of Care:  Inpatient Hospitalization.  Reason for Admission:   This is a 46 year old African-American female, admitted to [facility] from the [facility] ED with reports of suicidal thoughts With plans to overdose on medication due to increased depression. Patient reports, "I went to the [facility] ED last Thursday night. I have have going through a lot of stuff. My emotions are now and it is  getting the best of me. I am also trying to get disability benefits. While I was going through the disability evaluations, I was told to try to see a mental health person. I lost my job 2 weeks ago and I have to move in with my daughter. I am hurting very bad inside. I don't know how long that I have been depressed, all I know is that I am not doing well emotionally. I am not taking any medication for depression. I know I need to be on something for my depression. I have a lot of health problems. I had stroke and I am suffering from heart disease also. I take a lot of blood pressure medicines as well. Last Thursday, I was hearing this voice that was telling me how worthless I am. The voice was telling to go a head and end it because it is not going to get any better".  Hospital Course:   The patient attended treatment team meeting this am and met with treatment team members. The patient's symptoms, treatment plan and response to treatment was discussed. The patient endorsed that their symptoms have improved. The patient also stated that they felt stable for discharge.  They reported that from this hospital stay they had learned many coping skills.  In other to maintain their psychiatric stability, they will continue psychiatric care on an outpatient basis. They will follow-up as outlined below.  In addition they were instructed  to take all your medications as prescribed by their mental healthcare provider and to report any adverse effects and or reactions from your medicines to their outpatient provider promptly.  The patient is  also instructed and cautioned to not engage in alcohol and or illegal drug use while on prescription medicines.  In the event of worsening symptoms the patient is instructed to call the crisis hotline, 911 and or go to the nearest ED for appropriate evaluation and treatment of symptoms.   Also while a patient in this hospital, the patient received medication management for his  psychiatric symptoms. They were ordered and received as outlined below:    Medication List     As of 12/13/2011  6:59 PM    STOP taking these medications         KLOR-CON 10 10 MEQ CR tablet   Generic drug: potassium chloride      NON FORMULARY      TAKE these medications      Indication    albuterol 108 (90 BASE) MCG/ACT inhaler   Commonly known as: PROVENTIL HFA;VENTOLIN HFA   Inhale 2 puffs into the lungs every 4 (four) hours as needed for wheezing or shortness of breath.       amLODipine 5 MG tablet   Commonly known as: NORVASC   Take 1 tablet (5 mg total) by mouth daily at 8 pm. For blood pressure.       aspirin 81 MG tablet   Take 1 tablet (81 mg total) by mouth daily. For blood thinner.       citalopram 40 MG tablet   Commonly known as: CELEXA   Take 1 tablet (40 mg total) by mouth daily. For depression and anxiety.       cloNIDine 0.3 MG tablet   Commonly known as: CATAPRES   Take 1 tablet (0.3 mg total) by mouth 2 (two) times daily in the am and at bedtime.. For blood pressure control.       ferrous sulfate 325 (65 FE) MG tablet   Take 1 tablet (325 mg total) by mouth 2 (two) times daily with a meal. For anemia.       furosemide 20 MG tablet   Commonly known as: LASIX   Take 1 tablet (20 mg total) by mouth daily. For blood pressure.       lisinopril 20 MG tablet   Commonly known as: PRINIVIL,ZESTRIL   Take 1 tablet (20 mg total) by mouth daily at 8 pm. For blood pressure.       loratadine 10 MG tablet   Commonly known as: CLARITIN   Take 1 tablet (10 mg total) by mouth daily. For seasonal allergies.       multivitamin with minerals Tabs   Take 1 tablet by mouth daily. For nutritional supplementation.       potassium chloride 10 MEQ tablet   Commonly known as: K-DUR,KLOR-CON   Take 1 tablet (10 mEq total) by mouth 2 (two) times daily in the am and at bedtime.. For hypokalemia.       traZODone 100 MG tablet   Commonly known as: DESYREL   Take 1 tablet  (100 mg total) by mouth at bedtime. For sleep.        They were also enrolled in group counseling sessions and activities in which they participated actively.       Follow-up Information    Follow up with [facility] on 11/25/2011. (Appointment scheduled at 8:00 am with Dr. Eulah Pont)    Contact information:   [address], [place] [address] [phone]        Upon discharge, patient adamantly denies suicidal, homicidal ideations, auditory, visual hallucinations  and or delusional thinking. They left [facility] with all personal belongings via personal transportation in no apparent distress.  Consults:  Please see electronic medical record for details.   Significant Diagnostic Studies:  Please see electronic medical record for details.   Discharge Vitals:   Blood pressure 105/73, pulse 73, temperature 98.2 F (36.8 C), temperature source Oral, resp. rate 18, height 5\' 6"  (1.676 m), weight 76.658 kg (169 lb), last menstrual period 10/22/2011, SpO2 100.00%..  Mental Status Exam: Demographic factors:  Low socioeconomic status  Unemployed   Current Mental Status Per Nursing Assessment::  On Admission: Suicidal ideation indicated by patient  At Discharge: Time was spent today discussing with the patient her current symptoms. The patient states that she is sleeping well and reports a slight decrease in appetite. She reports very mild feelings of sadness, anhedonia and depressed mood as well as very mild anxiety symptoms. She adamantly denies any current suicidal or homicidal ideations as well as adamantly denies any auditory or visual hallucinations or delusional thinking today.  The patient states that she feels ready for discharge and will be discharged in the morning after samples of medications and follow up appointments are arranged.   Current Mental Status Per Physician:  Diagnosis:  Axis I: Major Depressive Disorder - Recurrent with Psychotic Features.  Cocaine Abuse.   The patient  was seen today and reports the following:   ADL's: Intact.  Sleep: The patient reports to sleeping well last night.  Appetite: The patient reports a decreased appetite today.   Mild>(1-10) >Severe  Hopelessness (1-10): 0  Depression (1-10): 2  Anxiety (1-10): 2  Suicidal Ideation: The patient adamantly denies any suicidal ideations today.  Plan: No  Intent: No  Means: No   Homicidal Ideation: The patient adamantly denies any homicidal ideations today.  Plan: No  Intent: No.  Means: No   General Appearance/Behavior: The patient was friendly and cooperative today with this provider.  Eye Contact: Good.  Speech: Appropriate in rate and volume with no pressuring of speech noted today.  Motor Behavior: wnl.  Level of Consciousness: Alert and Oriented x 3.  Mental Status: Alert and Oriented x 3.  Mood: Very mildly depressed.  Affect: Very mildly constricted.  Anxiety Level: Very mild anxiety reported today.  Thought Process: wnl.  Thought Content: The patient denies any auditory or visual hallucinations or delusional thinking today.  Perception: wnl.  Judgment: Fair to Good.  Insight: Fair to Good.  Cognition: Oriented to person, place and time.   Loss Factors:  Decrease in vocational status;Loss of significant relationship Homelessness. Corporate treasurer.   Historical Factors:  Impulsivity History of substance abuse. Long history of mental illness.   Risk Reduction Factors:  Living with another person, especially a relative Good access to community services.   Continued Clinical Symptoms:  Depression: Anhedonia  Comorbid alcohol abuse/dependence  More than one psychiatric diagnosis  Previous Psychiatric Diagnoses and Treatments  Medical Diagnoses and Treatments/Surgeries   Discharge Diagnoses:  AXIS I: Major Depressive Disorder - Recurrent with Psychotic Features.  Cocaine Abuse.  AXIS II: Deferred.  AXIS III: 1. Coronary artery disease  2. Lexiscan myoview  (10/12) with significant ST depression upon Lexiscan injection but no ischemia or infarction on perfusion images. Left heart cath (10/12): 30% mid LAD, 30% ostial D1, 50% ostial D2.  3. History of Cerebral Vascular Accident.  4. Hypertension  5. Tricuspid regurgitation  6. Iron deficiency Anemia.  7. Leukocytoclastic vasculitis  8. Pulmonary HTN  9. Echo (  9/11) with EF 65%, mild LVH, mild AI, mild MR, moderate to possibly severe TR with PA systolic pressure 52 mmHg. Echo (10/12): severe LV hypertrophy, EF 60-65%, mild MR, mild AI, moderate to severe tricuspid regurgitation, PA systolic pressure 38 mmHg.  10. Asthma  11. CHF (congestive heart failure)  AXIS IV: Multiple Serious Non-psychiatric Medical Issues. Substance Abuse Issues. 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  AXIS V: GAF at time of admission approximately 35. GAF at time of discharge approximately 50.   Cognitive Features That Contribute To Risk:  None Noted.   Current Medications:  Current Facility-Administered Medications   Medication  Dose  Route  Frequency  Provider  Last Rate  Last Dose   .  acetaminophen (TYLENOL) tablet 650 mg  650 mg  Oral  Q6H PRN  Nehemiah Settle, MD   650 mg at 11/15/11 2025   .  albuterol (PROVENTIL HFA;VENTOLIN HFA) 108 (90 BASE) MCG/ACT inhaler 2 puff  2 puff  Inhalation  Q4H PRN  Nehemiah Settle, MD   2 puff at 11/15/11 2024   .  alum & mag hydroxide-simeth (MAALOX/MYLANTA) 200-200-20 MG/5ML suspension 30 mL  30 mL  Oral  Q4H PRN  Nehemiah Settle, MD   30 mL at 11/16/11 1250   .  amLODipine (NORVASC) tablet 5 mg  5 mg  Oral  Q2000  Sanjuana Kava, NP   5 mg at 11/15/11 2025   .  aspirin chewable tablet 81 mg  81 mg  Oral  Daily  Nehemiah Settle, MD   81 mg at 11/16/11 4098   .  chlordiazePOXIDE (LIBRIUM) capsule 25 mg  25 mg  Oral  Q6H PRN  Nehemiah Settle, MD     .  citalopram (CELEXA) tablet 40 mg  40 mg  Oral  Daily  Walter Min D  Kmya Placide, MD     .  cloNIDine (CATAPRES) tablet 0.3 mg  0.3 mg  Oral  BID  Nehemiah Settle, MD   0.3 mg at 11/16/11 1712   .  ferrous sulfate tablet 325 mg  325 mg  Oral  BID WC  Nehemiah Settle, MD   325 mg at 11/16/11 1713   .  furosemide (LASIX) tablet 20 mg  20 mg  Oral  Daily  Nehemiah Settle, MD   20 mg at 11/16/11 0834   .  hydrOXYzine (ATARAX/VISTARIL) tablet 25 mg  25 mg  Oral  Q6H PRN  Nehemiah Settle, MD   25 mg at 11/14/11 2122   .  lisinopril (PRINIVIL,ZESTRIL) tablet 20 mg  20 mg  Oral  Q2000  Sanjuana Kava, NP   20 mg at 11/15/11 2025   .  loperamide (IMODIUM) capsule 2-4 mg  2-4 mg  Oral  PRN  Nehemiah Settle, MD     .  loratadine (CLARITIN) tablet 10 mg  10 mg  Oral  Daily  Nehemiah Settle, MD   10 mg at 11/16/11 0834   .  magnesium hydroxide (MILK OF MAGNESIA) suspension 30 mL  30 mL  Oral  Daily PRN  Nehemiah Settle, MD   30 mL at 11/16/11 1310   .  multivitamin with minerals tablet 1 tablet  1 tablet  Oral  Daily  Nehemiah Settle, MD   1 tablet at 11/16/11 0835   .  nicotine (NICODERM CQ - dosed in mg/24 hours) patch 21 mg  21 mg  Transdermal  Q0600  Tomasita Crumble  Filbert Schilder, MD   21 mg at 11/16/11 0543   .  ondansetron (ZOFRAN-ODT) disintegrating tablet 4 mg  4 mg  Oral  Q6H PRN  Nehemiah Settle, MD     .  potassium chloride (K-DUR,KLOR-CON) CR tablet 10 mEq  10 mEq  Oral  BID  Mike Craze, MD   10 mEq at 11/16/11 1713   .  thiamine (B-1) injection 100 mg  100 mg  Intramuscular  Once  Nehemiah Settle, MD     .  thiamine (VITAMIN B-1) tablet 100 mg  100 mg  Oral  Daily  Nehemiah Settle, MD   100 mg at 11/16/11 0836   .  DISCONTD: citalopram (CELEXA) tablet 20 mg  20 mg  Oral  Daily  Sanjuana Kava, NP   20 mg at 11/16/11 4782    Lab Results: No results found for this or any previous visit (from the past 48 hour(s)).  Physical Findings:  AIMS: Facial and Oral  Movements  Muscles of Facial Expression: None, normal  Lips and Perioral Area: None, normal  Jaw: None, normal  Tongue: None, normal,Extremity Movements  Upper (arms, wrists, hands, fingers): None, normal  Lower (legs, knees, ankles, toes): None, normal, Trunk Movements  Neck, shoulders, hips: None, normal, Overall Severity  Severity of abnormal movements (highest score from questions above): None, normal  Incapacitation due to abnormal movements: None, normal  Patient's awareness of abnormal movements (rate only patient's report): No Awareness, Dental Status  Current problems with teeth and/or dentures?: No  Does patient usually wear dentures?: No  CIWA: CIWA-Ar Total: 0  COWS: COWS Total Score: 0   Review of Systems:  Neurological: No headaches, seizures or dizziness reported.  G.I.: The patient denies any constipation or stomach upset today.  Musculoskeletal: The patient denies any musculoskeletal issues today.   Time was spent today discussing with the patient her current symptoms. The patient states that she is sleeping well and reports a slight decrease in appetite. She reports very mild feelings of sadness, anhedonia and depressed mood as well as very mild anxiety symptoms. She adamantly denies any current suicidal or homicidal ideations as well as adamantly denies any auditory or visual hallucinations or delusional thinking today.  The patient states that she feels ready for discharge and will be discharged in the morning after samples of medications and follow up appointments are arranged.   Treatment Plan Summary:  1. Daily contact with patient to assess and evaluate symptoms and progress in treatment.  2. Medication management  3. The patient will deny suicidal ideations or homicidal ideations for 48 hours prior to discharge and have a depression and anxiety rating of 3 or less. The patient will also deny any auditory or visual hallucinations or delusional thinking.  4. The  patient will deny any symptoms of substance withdrawal at time of discharge.   Plan:  1. Will continue the patient on her current medications as listed above.  2. Laboratory Studies reviewed.  3. Will continue to monitor.  4. The patient will be discharged as requested in the morning to outpatient follow up.   Suicide Risk:  Minimal: No identifiable suicidal ideation. Patients presenting with no risk factors but with morbid ruminations; may be classified as minimal risk based on the severity of the depressive symptoms   Plan Of Care/Follow-up recommendations:  Activity: As tolerated.  Diet: Low Sodium, Heart Healthy Diet.  Other: Please take all medications only as directed and keep all  scheduled follow up appointments. Please return to your local Emergency Room if you have a return of any thoughts of harming yourself or others.  Discharge destination:  Home  Is patient on multiple antipsychotic therapies at discharge:  No  Has Patient had three or more failed trials of antipsychotic monotherapy by history: N/A Recommended Plan for Multiple Antipsychotic Therapies: N/A  Discharge Orders    Future Orders Please Complete By Expires   Diet - low sodium heart healthy      Increase activity slowly      Discharge instructions      Comments:   Please take all medications only as prescribed and keep all scheduled follow up appointments.  Please return to the Emergency Room if you have a return of any thoughts of harming yourself or others.       Medication List     As of 12/13/2011  6:59 PM    STOP taking these medications         KLOR-CON 10 10 MEQ CR tablet   Generic drug: potassium chloride      NON FORMULARY      TAKE these medications      Indication    albuterol 108 (90 BASE) MCG/ACT inhaler   Commonly known as: PROVENTIL HFA;VENTOLIN HFA   Inhale 2 puffs into the lungs every 4 (four) hours as needed for wheezing or shortness of breath.       amLODipine 5 MG tablet    Commonly known as: NORVASC   Take 1 tablet (5 mg total) by mouth daily at 8 pm. For blood pressure.       aspirin 81 MG tablet   Take 1 tablet (81 mg total) by mouth daily. For blood thinner.       citalopram 40 MG tablet   Commonly known as: CELEXA   Take 1 tablet (40 mg total) by mouth daily. For depression and anxiety.       cloNIDine 0.3 MG tablet   Commonly known as: CATAPRES   Take 1 tablet (0.3 mg total) by mouth 2 (two) times daily in the am and at bedtime.. For blood pressure control.       ferrous sulfate 325 (65 FE) MG tablet   Take 1 tablet (325 mg total) by mouth 2 (two) times daily with a meal. For anemia.       furosemide 20 MG tablet   Commonly known as: LASIX   Take 1 tablet (20 mg total) by mouth daily. For blood pressure.       lisinopril 20 MG tablet   Commonly known as: PRINIVIL,ZESTRIL   Take 1 tablet (20 mg total) by mouth daily at 8 pm. For blood pressure.       loratadine 10 MG tablet   Commonly known as: CLARITIN   Take 1 tablet (10 mg total) by mouth daily. For seasonal allergies.       multivitamin with minerals Tabs   Take 1 tablet by mouth daily. For nutritional supplementation.       potassium chloride 10 MEQ tablet   Commonly known as: K-DUR,KLOR-CON   Take 1 tablet (10 mEq total) by mouth 2 (two) times daily in the am and at bedtime.. For hypokalemia.       traZODone 100 MG tablet   Commonly known as: DESYREL   Take 1 tablet (100 mg total) by mouth at bedtime. For sleep.            Follow-up Information  Follow up with [facility] on 11/25/2011. (Appointment scheduled at 8:00 am with Dr. Eulah Pont)    Contact information:   [address], [place] [address] [phone]        Follow-up recommendations:   Activities: Resume typical activities Diet: Resume typical diet Other: Follow up with outpatient provider and report any side effects to out patient prescriber.  Comments:  Take all your medications as prescribed by your mental  healthcare provider. Report any adverse effects and or reactions from your medicines to your outpatient provider promptly. Patient is instructed and cautioned to not engage in alcohol and or illegal drug use while on prescription medicines. In the event of worsening symptoms, patient is instructed to call the crisis hotline, 911 and or go to the nearest ED for appropriate evaluation and treatment of symptoms. Follow-up with your primary care provider for your other medical issues, concerns and or health care needs.  Signed: Franchot Gallo 12/13/2011 6:59 PM

## 2012-01-18 ENCOUNTER — Encounter (HOSPITAL_COMMUNITY): Payer: Self-pay | Admitting: Emergency Medicine

## 2012-01-18 ENCOUNTER — Emergency Department (HOSPITAL_COMMUNITY)
Admission: EM | Admit: 2012-01-18 | Discharge: 2012-01-18 | Disposition: A | Discharge status: Home / Self Care | Payer: Self-pay | Attending: Emergency Medicine | Admitting: Emergency Medicine

## 2012-01-18 ENCOUNTER — Emergency Department (HOSPITAL_COMMUNITY): Payer: Self-pay

## 2012-01-18 DIAGNOSIS — F17200 Nicotine dependence, unspecified, uncomplicated: Secondary | ICD-10-CM | POA: Insufficient documentation

## 2012-01-18 DIAGNOSIS — J45909 Unspecified asthma, uncomplicated: Secondary | ICD-10-CM | POA: Insufficient documentation

## 2012-01-18 DIAGNOSIS — I509 Heart failure, unspecified: Secondary | ICD-10-CM | POA: Insufficient documentation

## 2012-01-18 DIAGNOSIS — I10 Essential (primary) hypertension: Secondary | ICD-10-CM | POA: Insufficient documentation

## 2012-01-18 DIAGNOSIS — D509 Iron deficiency anemia, unspecified: Secondary | ICD-10-CM | POA: Insufficient documentation

## 2012-01-18 DIAGNOSIS — Z760 Encounter for issue of repeat prescription: Secondary | ICD-10-CM | POA: Insufficient documentation

## 2012-01-18 DIAGNOSIS — R109 Unspecified abdominal pain: Secondary | ICD-10-CM | POA: Insufficient documentation

## 2012-01-18 DIAGNOSIS — R0602 Shortness of breath: Secondary | ICD-10-CM | POA: Insufficient documentation

## 2012-01-18 DIAGNOSIS — I2510 Atherosclerotic heart disease of native coronary artery without angina pectoris: Secondary | ICD-10-CM | POA: Insufficient documentation

## 2012-01-18 DIAGNOSIS — F1910 Other psychoactive substance abuse, uncomplicated: Secondary | ICD-10-CM | POA: Insufficient documentation

## 2012-01-18 MED ORDER — AMLODIPINE BESYLATE 10 MG PO TABS: 5.0000 mg | ORAL_TABLET | Freq: Every day | ORAL | Status: DC

## 2012-01-18 MED ORDER — CITALOPRAM HYDROBROMIDE 40 MG PO TABS: 40.0000 mg | ORAL_TABLET | Freq: Every day | ORAL | Status: DC

## 2012-01-18 MED ORDER — CLONIDINE HCL 0.2 MG PO TABS: 0.3000 mg | ORAL_TABLET | Freq: Two times a day (BID) | ORAL | Status: DC

## 2012-01-18 MED ORDER — FUROSEMIDE 20 MG PO TABS: 20.0000 mg | ORAL_TABLET | Freq: Two times a day (BID) | ORAL | Status: DC

## 2012-01-18 MED ORDER — ASPIRIN 81 MG PO CHEW: 81.0000 mg | CHEWABLE_TABLET | Freq: Every day | ORAL | Status: DC

## 2012-01-18 MED ORDER — POTASSIUM CHLORIDE CRYS ER 20 MEQ PO TBCR: 10.0000 meq | EXTENDED_RELEASE_TABLET | Freq: Two times a day (BID) | ORAL | Status: DC

## 2012-01-18 NOTE — ED Notes (Signed)
Pt states she was pt at [facility] and has been unable to get her meds refilled since they closed.  Pt has appt on Friday with new MD but has been out of meds x 1 week and needs her medications refilled.

## 2012-01-18 NOTE — ED Provider Notes (Signed)
History  This chart was scribed for non-physician practitioner working with Morgan Hutching, MD by [name]. This patient was seen in room WTR9/WTR9 and the patient's care was started at 1214.  CSN: [id number]  Arrival date & time 01/18/12  1117   First MD Initiated Contact with Patient 01/18/12 1214      Chief Complaint  Patient presents with  . Medication Refill    The history is provided by the patient. No language interpreter was used.   Morgan Bean is a 46 y.o. female who presents to the Emergency Department complaining of CP  associated indigestion, productive cough and abdominal. She states that both the CP and abdominal pain started 5 days ago after she became sick from drinking too much. She denies nausea, emesis and diarrhea. She describes the pain as sharp and is aggrivated when she eats, drinks or breathes deeply. She states that the pain radiates all throughout her chest and abdomen. She states that she has not taken her medications for the past week. She has a h/o CAD, HTN and tricuspid regurgitation. She is a current everyday smoker and occasional alcohol user. She denies taking her medications for the past week and needs them refilled.     Past Medical History  Diagnosis Date  . Coronary artery disease     Lexiscan myoview (10/12) with significant ST depression upon Lexiscan injection but no ischemia or infarction on perfusion images. Left heart cath (10/12): 30% mid LAD, 30% ostial D1, 50% ostial D2.    . Stroke   . Hypertension   . Tricuspid regurgitation   . Iron deficiency     hx of  . Polysubstance abuse   . Tobacco abuse   . Leukocytoclastic vasculitis   . Pulmonary HTN     Echo (9/11) with EF 65%, mild LVH, mild AI, mild MR, moderate to possibly severe TR with PA systolic pressure 52 mmHg. Echo (10/12): severe LV hypertrophy, EF 60-65%, mild MR, mild AI, moderate to severe tricuspid regurgitation, PA systolic pressure 38 mmHg.    . Asthma   . CHF  (congestive heart failure)     Past Surgical History  Procedure Date  . Ankle surgery     right  . Cardiac catheterization   . I&d extremity 08/09/2011    Procedure: IRRIGATION AND DEBRIDEMENT EXTREMITY;  Surgeon: Robyne Askew, MD;  Location: [facility];  Service: General;  Laterality: Left;  i & D Left axilla abscess    Family History  Problem Relation Age of Onset  . Coronary artery disease Mother     DM and breast cancer  . Diabetes Mother   . Cancer Maternal Grandmother     breast    History  Substance Use Topics  . Smoking status: Current Some Day Smoker -- 0.5 packs/day for 20 years    Types: Cigarettes  . Smokeless tobacco: Never Used  . Alcohol Use: Yes     pint of wine 2-3 times a week   No OB history available.   Review of Systems  Respiratory: Positive for shortness of breath.   Cardiovascular: Positive for chest pain.  Gastrointestinal: Positive for abdominal pain.  All other systems reviewed and are negative.    Allergies  Ciprofloxacin  Home Medications   Current Outpatient Rx  Name Route Sig Dispense Refill  . ALBUTEROL SULFATE HFA 108 (90 BASE) MCG/ACT IN AERS Inhalation Inhale 2 puffs into the lungs every 4 (four) hours as needed for wheezing or shortness of  breath. 6.7 g 0  . AMLODIPINE BESYLATE 5 MG PO TABS Oral Take 1 tablet (5 mg total) by mouth daily at 8 pm. For blood pressure. 30 tablet 0  . ASPIRIN 81 MG PO TABS Oral Take 1 tablet (81 mg total) by mouth daily. For blood thinner. 30 tablet 0  . CITALOPRAM HYDROBROMIDE 40 MG PO TABS Oral Take 1 tablet (40 mg total) by mouth daily. For depression and anxiety. 30 tablet 0  . CLONIDINE HCL 0.3 MG PO TABS Oral Take 1 tablet (0.3 mg total) by mouth 2 (two) times daily in the am and at bedtime.. For blood pressure control. 60 tablet 0  . FERROUS SULFATE 325 (65 FE) MG PO TABS Oral Take 1 tablet (325 mg total) by mouth 2 (two) times daily with a meal. For anemia. 60 tablet 0  . FUROSEMIDE 20 MG PO TABS  Oral Take 1 tablet (20 mg total) by mouth daily. For blood pressure. 30 tablet 0  . LISINOPRIL 20 MG PO TABS Oral Take 1 tablet (20 mg total) by mouth daily at 8 pm. For blood pressure. 30 tablet 0  . LORATADINE 10 MG PO TABS Oral Take 1 tablet (10 mg total) by mouth daily. For seasonal allergies. 30 tablet 0  . ADULT MULTIVITAMIN W/MINERALS CH Oral Take 1 tablet by mouth daily. For nutritional supplementation. 30 tablet 0  . POLYETHYLENE GLYCOL 3350 PO POWD Oral Take 17 g by mouth daily.    [name] POTASSIUM CHLORIDE CRYS ER 10 MEQ PO TBCR Oral Take 1 tablet (10 mEq total) by mouth 2 (two) times daily in the am and at bedtime.. For hypokalemia. 60 tablet 0  . AMLODIPINE BESYLATE 10 MG PO TABS Oral Take 0.5 tablets (5 mg total) by mouth daily. 10 tablet 0  . ASPIRIN 81 MG PO CHEW Oral Chew 1 tablet (81 mg total) by mouth daily. 10 tablet 0  . CITALOPRAM HYDROBROMIDE 40 MG PO TABS Oral Take 1 tablet (40 mg total) by mouth daily. 10 tablet 0  . CLONIDINE HCL 0.2 MG PO TABS Oral Take 1.5 tablets (0.3 mg total) by mouth 2 (two) times daily. 10 tablet 0  . FUROSEMIDE 20 MG PO TABS Oral Take 1 tablet (20 mg total) by mouth 2 (two) times daily. 10 tablet 0  . POTASSIUM CHLORIDE CRYS ER 20 MEQ PO TBCR Oral Take 0.5 tablets (10 mEq total) by mouth 2 (two) times daily. 10 tablet 0  . TRAZODONE HCL 100 MG PO TABS Oral Take 1 tablet (100 mg total) by mouth at bedtime. For sleep. 30 tablet 0    Triage Vitals: BP 171/118  Pulse 88  Temp 98 F (36.7 C) (Oral)  Resp 16  SpO2 100%  Physical Exam  Nursing note and vitals reviewed. Constitutional: She is oriented to person, place, and time. She appears well-developed and well-nourished. No distress.  HENT:  Head: Normocephalic and atraumatic.  Eyes: EOM are normal. Pupils are equal, round, and reactive to light.  Neck: Normal range of motion. Neck supple. No tracheal deviation present.  Cardiovascular: Normal rate, regular rhythm and normal heart sounds.     Pulmonary/Chest: Effort normal and breath sounds normal. No respiratory distress.       rhonchi or right lower lobe  Abdominal: Soft. She exhibits no distension. There is tenderness.       RUQ mild tenderness to palpation  Musculoskeletal: Normal range of motion. She exhibits no edema.  Neurological: She is alert and oriented to person,  place, and time.  Skin: Skin is warm and dry.  Psychiatric: She has a normal mood and affect. Her behavior is normal.    ED Course  Procedures (including critical care time)  DIAGNOSTIC STUDIES: Oxygen Saturation is 100% on room air, normal by my interpretation.    COORDINATION OF CARE:  1:04 PM: Discussed treatment plan which includes a CXR with pt at bedside and pt agreed to plan.    Labs Reviewed - No data to display Dg Chest 2 View  01/18/2012  *RADIOLOGY REPORT*  Clinical Data: Right chest pain.  Shortness breath, cough, congestion and fever.  CHEST - 2 VIEW  Comparison: 11/12/2011.  Findings: Trachea is midline.  Heart size stable.  Lungs are clear. No pleural fluid.  IMPRESSION: No acute findings.   Original Report Authenticated By: Reyes Ivan, M.D.      1. Medication refill       MDM  1:57 PM Chest xray negative. Patient will be discharged with medication refills. Patient has an appointment with a doctor in 4 days for medication refills. Patient afebrile and no further evaluation needed at this time.    This chart was written by a scribe in my presence. I have reviewed and agree with the note.    MSE was initiated and I personally evaluated the patient and placed orders (if any) at There are other unrelated non-urgent complaints, but due to the busy schedule and the amount of time I've already spent with her, time does not permit me to address these routine issues at today's visit. I've requested another appointment to review these additional issues. on @date @.   The patient appears stable so that the remainder of the MSE  may be completed by another provider.   Emilia Beck, PA-C 01/18/12 1658

## 2012-01-19 NOTE — ED Provider Notes (Signed)
Medical screening examination/treatment/procedure(s) were performed by non-physician practitioner and as supervising physician I was immediately available for consultation/collaboration.  Sriansh Farra, MD 01/19/12 1524 

## 2012-04-06 ENCOUNTER — Encounter (HOSPITAL_COMMUNITY): Payer: Self-pay | Admitting: *Deleted

## 2012-04-06 ENCOUNTER — Emergency Department (HOSPITAL_COMMUNITY): Payer: Self-pay

## 2012-04-06 ENCOUNTER — Observation Stay (HOSPITAL_COMMUNITY)
Admission: EM | Admit: 2012-04-06 | Discharge: 2012-04-07 | Disposition: A | Discharge status: Home / Self Care | Payer: Self-pay | Attending: Cardiology | Admitting: Cardiology

## 2012-04-06 DIAGNOSIS — F1910 Other psychoactive substance abuse, uncomplicated: Secondary | ICD-10-CM

## 2012-04-06 DIAGNOSIS — R0789 Other chest pain: Secondary | ICD-10-CM | POA: Diagnosis present

## 2012-04-06 DIAGNOSIS — I272 Pulmonary hypertension, unspecified: Secondary | ICD-10-CM | POA: Diagnosis present

## 2012-04-06 DIAGNOSIS — I5032 Chronic diastolic (congestive) heart failure: Secondary | ICD-10-CM | POA: Insufficient documentation

## 2012-04-06 DIAGNOSIS — I10 Essential (primary) hypertension: Secondary | ICD-10-CM | POA: Diagnosis present

## 2012-04-06 DIAGNOSIS — I509 Heart failure, unspecified: Secondary | ICD-10-CM | POA: Insufficient documentation

## 2012-04-06 DIAGNOSIS — R079 Chest pain, unspecified: Secondary | ICD-10-CM

## 2012-04-06 DIAGNOSIS — I5033 Acute on chronic diastolic (congestive) heart failure: Secondary | ICD-10-CM | POA: Diagnosis present

## 2012-04-06 DIAGNOSIS — F1310 Sedative, hypnotic or anxiolytic abuse, uncomplicated: Secondary | ICD-10-CM | POA: Insufficient documentation

## 2012-04-06 DIAGNOSIS — R110 Nausea: Secondary | ICD-10-CM | POA: Insufficient documentation

## 2012-04-06 DIAGNOSIS — I2789 Other specified pulmonary heart diseases: Secondary | ICD-10-CM | POA: Insufficient documentation

## 2012-04-06 DIAGNOSIS — R197 Diarrhea, unspecified: Secondary | ICD-10-CM | POA: Insufficient documentation

## 2012-04-06 DIAGNOSIS — J209 Acute bronchitis, unspecified: Principal | ICD-10-CM | POA: Diagnosis present

## 2012-04-06 DIAGNOSIS — F1110 Opioid abuse, uncomplicated: Secondary | ICD-10-CM | POA: Insufficient documentation

## 2012-04-06 DIAGNOSIS — R072 Precordial pain: Secondary | ICD-10-CM | POA: Insufficient documentation

## 2012-04-06 DIAGNOSIS — Z8673 Personal history of transient ischemic attack (TIA), and cerebral infarction without residual deficits: Secondary | ICD-10-CM | POA: Insufficient documentation

## 2012-04-06 DIAGNOSIS — I2510 Atherosclerotic heart disease of native coronary artery without angina pectoris: Secondary | ICD-10-CM | POA: Diagnosis present

## 2012-04-06 LAB — BASIC METABOLIC PANEL
BUN: 16 mg/dL (ref 6–23)
Chloride: 103 mEq/L (ref 96–112)
Creatinine, Ser: 0.93 mg/dL (ref 0.50–1.10)
GFR calc Af Amer: 83 mL/min — ABNORMAL LOW (ref >90)
Glucose, Bld: 79 mg/dL (ref 70–99)

## 2012-04-06 LAB — CBC
Hemoglobin: 11.2 g/dL — ABNORMAL LOW (ref 12.0–15.0)
MCH: 26.3 pg (ref 26.0–34.0)
MCV: 81.9 fL (ref 78.0–100.0)
RBC: 4.26 MIL/uL (ref 3.87–5.11)

## 2012-04-06 LAB — CREATININE, SERUM: GFR calc Af Amer: 76 mL/min — ABNORMAL LOW (ref >90)

## 2012-04-06 LAB — CBC WITH DIFFERENTIAL/PLATELET
Basophils Relative: 1 % (ref 0–1)
Eosinophils Absolute: 0.2 10*3/uL (ref 0.0–0.7)
HCT: 37.5 % (ref 36.0–46.0)
Hemoglobin: 12.3 g/dL (ref 12.0–15.0)
Lymphs Abs: 2.1 10*3/uL (ref 0.7–4.0)
MCH: 26.9 pg (ref 26.0–34.0)
MCHC: 32.8 g/dL (ref 30.0–36.0)
Monocytes Absolute: 0.7 10*3/uL (ref 0.1–1.0)
Monocytes Relative: 12 % (ref 3–12)
Neutro Abs: 3.3 10*3/uL (ref 1.7–7.7)

## 2012-04-06 LAB — RAPID URINE DRUG SCREEN, HOSP PERFORMED
Amphetamines: NOT DETECTED
Barbiturates: POSITIVE — AB
Cocaine: NOT DETECTED
Tetrahydrocannabinol: NOT DETECTED

## 2012-04-06 LAB — TROPONIN I: Troponin I: <0.3 ng/mL (ref <0.30)

## 2012-04-06 MED ORDER — TRAZODONE HCL 100 MG PO TABS
100.0000 mg | ORAL_TABLET | Freq: Every day | ORAL | Status: DC
  Administered 2012-04-07: 100 mg via ORAL
  Filled 2012-04-06 (×2): qty 1

## 2012-04-06 MED ORDER — FUROSEMIDE 20 MG PO TABS
20.0000 mg | ORAL_TABLET | Freq: Two times a day (BID) | ORAL | Status: DC
  Administered 2012-04-06 – 2012-04-07 (×2): 20 mg via ORAL
  Filled 2012-04-06 (×3): qty 1

## 2012-04-06 MED ORDER — SODIUM CHLORIDE 0.9 % IJ SOLN: 3.0000 mL | INTRAMUSCULAR | Status: DC | PRN

## 2012-04-06 MED ORDER — ONDANSETRON HCL 4 MG/2ML IJ SOLN: 4.0000 mg | Freq: Four times a day (QID) | INTRAMUSCULAR | Status: DC | PRN

## 2012-04-06 MED ORDER — SODIUM CHLORIDE 0.9 % IV SOLN: 250.0000 mL | INTRAVENOUS | Status: DC | PRN

## 2012-04-06 MED ORDER — LORATADINE 10 MG PO TABS
10.0000 mg | ORAL_TABLET | Freq: Every day | ORAL | Status: DC
  Administered 2012-04-06 – 2012-04-07 (×2): 10 mg via ORAL
  Filled 2012-04-06 (×2): qty 1

## 2012-04-06 MED ORDER — MORPHINE SULFATE 4 MG/ML IJ SOLN
4.0000 mg | Freq: Once | INTRAMUSCULAR | Status: AC
Start: 2012-04-06 — End: 2012-04-06
  Administered 2012-04-06: 4 mg via INTRAMUSCULAR
  Filled 2012-04-06: qty 1

## 2012-04-06 MED ORDER — FERROUS SULFATE 325 (65 FE) MG PO TABS
325.0000 mg | ORAL_TABLET | Freq: Two times a day (BID) | ORAL | Status: DC
  Administered 2012-04-07: 325 mg via ORAL
  Filled 2012-04-06 (×3): qty 1

## 2012-04-06 MED ORDER — GI COCKTAIL ~~LOC~~
30.0000 mL | Freq: Once | ORAL | Status: AC
  Administered 2012-04-06: 30 mL via ORAL
  Filled 2012-04-06: qty 30

## 2012-04-06 MED ORDER — POTASSIUM CHLORIDE CRYS ER 10 MEQ PO TBCR
10.0000 meq | EXTENDED_RELEASE_TABLET | Freq: Two times a day (BID) | ORAL | Status: DC
  Administered 2012-04-06 – 2012-04-07 (×2): 10 meq via ORAL
  Filled 2012-04-06 (×3): qty 1

## 2012-04-06 MED ORDER — DM-GUAIFENESIN ER 30-600 MG PO TB12
1.0000 | ORAL_TABLET | Freq: Two times a day (BID) | ORAL | Status: DC
  Administered 2012-04-06 – 2012-04-07 (×2): 1 via ORAL
  Filled 2012-04-06 (×3): qty 1

## 2012-04-06 MED ORDER — ENOXAPARIN SODIUM 40 MG/0.4ML ~~LOC~~ SOLN
40.0000 mg | SUBCUTANEOUS | Status: DC
  Administered 2012-04-06: 40 mg via SUBCUTANEOUS
  Filled 2012-04-06 (×2): qty 0.4

## 2012-04-06 MED ORDER — ADULT MULTIVITAMIN W/MINERALS CH
1.0000 | ORAL_TABLET | Freq: Every day | ORAL | Status: DC
  Administered 2012-04-06 – 2012-04-07 (×2): 1 via ORAL
  Filled 2012-04-06 (×2): qty 1

## 2012-04-06 MED ORDER — ALPRAZOLAM 0.25 MG PO TABS: 0.2500 mg | ORAL_TABLET | Freq: Two times a day (BID) | ORAL | Status: DC | PRN

## 2012-04-06 MED ORDER — ASPIRIN 81 MG PO CHEW
81.0000 mg | CHEWABLE_TABLET | Freq: Every day | ORAL | Status: DC
  Administered 2012-04-07: 81 mg via ORAL
  Filled 2012-04-06: qty 1

## 2012-04-06 MED ORDER — POLYETHYLENE GLYCOL 3350 17 GM/SCOOP PO POWD
17.0000 g | Freq: Every day | ORAL | Status: DC | PRN
  Filled 2012-04-06: qty 255

## 2012-04-06 MED ORDER — ASPIRIN 81 MG PO CHEW
324.0000 mg | CHEWABLE_TABLET | Freq: Once | ORAL | Status: AC
  Administered 2012-04-06: 324 mg via ORAL
  Filled 2012-04-06: qty 4

## 2012-04-06 MED ORDER — AMLODIPINE BESYLATE 5 MG PO TABS
5.0000 mg | ORAL_TABLET | Freq: Every day | ORAL | Status: DC
  Administered 2012-04-06: 5 mg via ORAL
  Filled 2012-04-06 (×2): qty 1

## 2012-04-06 MED ORDER — CLONIDINE HCL 0.3 MG PO TABS
0.3000 mg | ORAL_TABLET | Freq: Two times a day (BID) | ORAL | Status: DC
  Administered 2012-04-06 – 2012-04-07 (×2): 0.3 mg via ORAL
  Filled 2012-04-06 (×3): qty 1

## 2012-04-06 MED ORDER — LISINOPRIL 20 MG PO TABS
20.0000 mg | ORAL_TABLET | Freq: Every day | ORAL | Status: DC
  Administered 2012-04-06: 20 mg via ORAL
  Filled 2012-04-06 (×2): qty 1

## 2012-04-06 MED ORDER — CITALOPRAM HYDROBROMIDE 40 MG PO TABS
40.0000 mg | ORAL_TABLET | Freq: Every day | ORAL | Status: DC
  Administered 2012-04-06 – 2012-04-07 (×2): 40 mg via ORAL
  Filled 2012-04-06 (×2): qty 1

## 2012-04-06 MED ORDER — MORPHINE SULFATE 4 MG/ML IJ SOLN
4.0000 mg | Freq: Once | INTRAMUSCULAR | Status: AC
  Administered 2012-04-06: 4 mg via INTRAMUSCULAR
  Filled 2012-04-06: qty 1

## 2012-04-06 MED ORDER — ACETAMINOPHEN 325 MG PO TABS: 650.0000 mg | ORAL_TABLET | ORAL | Status: DC | PRN

## 2012-04-06 MED ORDER — IBUPROFEN 200 MG PO TABS
200.0000 mg | ORAL_TABLET | ORAL | Status: DC | PRN
  Administered 2012-04-06: 200 mg via ORAL
  Filled 2012-04-06: qty 1

## 2012-04-06 MED ORDER — ALBUTEROL SULFATE HFA 108 (90 BASE) MCG/ACT IN AERS
2.0000 | INHALATION_SPRAY | RESPIRATORY_TRACT | Status: DC | PRN
  Administered 2012-04-06: 2 via RESPIRATORY_TRACT
  Filled 2012-04-06: qty 6.7

## 2012-04-06 MED ORDER — NITROGLYCERIN 0.4 MG SL SUBL: 0.4000 mg | SUBLINGUAL_TABLET | SUBLINGUAL | Status: DC | PRN

## 2012-04-06 MED ORDER — SODIUM CHLORIDE 0.9 % IJ SOLN
3.0000 mL | Freq: Two times a day (BID) | INTRAMUSCULAR | Status: DC
  Administered 2012-04-06 – 2012-04-07 (×2): 3 mL via INTRAVENOUS

## 2012-04-06 NOTE — ED Notes (Signed)
Ordered Heart Healthy diet  

## 2012-04-06 NOTE — ED Provider Notes (Signed)
This chart was scribed for [name], MD by [name], ED Scribe. This patient was seen in room A07C/A07C and the patient's care was started at 10:00 AM.  Morgan Bean is a 47 y.o. female who presents to the Emergency Department complaining of 2 days of constant left-sided CP that radiates to the left shoulder with associated SOB and nausea. She denies modifying factors. She reports prior episodes due to HTN and states that she is taking her medications as prescribed. She has a h/o a prior MI but denies having any stents. She denies emesis and diarrhea.  Cardiologist is Dr. Sherlie Ban   PE CV: Regular rate and rhythm  Lungs: Clear to ausculation ABDOMEN: Soft and non-tender  10:07 AM- Discussed treatment plan with pt at bedside and pt agreed to plan.   2 days of constant L sided chest pain with SOB, DOE, nausea, cough. s/f CAD (nonobstructive by 10/12 cath), chronic diastolic CHF, h/o CVA, pulmonary HTN with mod-severe TR (2011 echo), HTN and h/o polysubstance abuse. Hx cocaine abuse.   I personally performed the services described in this documentation, which was scribed in my presence. The recorded information has been reviewed and is accurate.    [name], MD 04/06/12 [phone]

## 2012-04-06 NOTE — H&P (Signed)
History and Physical   Patient ID: Morgan Bean MRN: [id number], DOB/AGE: [birth year]   Admit date: 04/06/2012 Date of Consult: 04/06/2012   Primary Physician: Marca Ancona, MD Primary Cardiologist: Marca Ancona, MD  HPI: Morgan Bean is a 47 yo AA female with PMHx s/f CAD (nonobstructive by 10/12 cath), chronic diastolic CHF, h/o CVA, pulmonary HTN with mod-severe TR (2011 echo), HTN and h/o polysubstance abuse who presents to [facility] ED with complaints of chest pain.   The patient was seen in 2012 for exertional chest pain and DOE. She underwent a Lexiscan Myoview revealing ST depressions post-infusion, but no ischemia/infarction. A repeat echo in 12/2010 revealed LVEF 60-65%, severe LVH, mild MR and AI, mod-severe TR, PASP 38 mmHg. She was started with Lasix. A L/RHC was performed. 30% ostial LAD, ostial 50% D2, 30% mid LAD. Dyspnea was suspected to be in the setting of acute diastolic CHF with underlying severe LVH and uncontrolled hypertension. She has since followed up for BP management, she last saw Tereso Newcomer in the office in 03/2011 at which point up-titrated clonidine was controlling her BP, she was deemed euvolemic, and she was doing well from a cardiac perspective without further chest pain or dyspnea.   She was seen in consultation 07/2011 by Dr. Antoine Poche for pre-op eval to undergo left axillary abscess I&D. Echo revealed EF 60-65%, mild LVH, normal diastolic parameters, mild-mod TR, mild AI, mild MR, mild LA dilatation, PASP 55 mmHg. She was cleared for surgery without further work-up.  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   She reports experiencing worsening exertional chest pain and DOE for the past 2 days. She states that she was sick last week with what she thought was the flu. She reports associated cough productive of clear sputum, chills and muscles aches. Prior to this, she had no issues with  chest pain or shortness of breath, and generally felt well. She describes the chest pain as a left-sided pressure, worse on exertion, inspiration and cough. There have been no alleviating factors. No LE edema, PND, orthopnea, palpitations, lightheadedness. Last drug use was in 01/2012. She reports medication compliance. She states that she is homeless, but is able to refill her medications and attend follow-up appointments.   In the ED, EKG reveals subtle lateral new TW flattening/inversions, otherwise no evidence of ischemia. Initial TnI WNL. CXR without acute abnormalities. BMET & CBC WNL. VSS, specifically BP well-controlled.   Problem List: Past Medical History  Diagnosis Date  . Coronary artery disease     Lexiscan myoview (10/12) with significant ST depression upon Lexiscan injection but no ischemia or infarction on perfusion images. Left heart cath (10/12): 30% mid LAD, 30% ostial D1, 50% ostial D2.    . Stroke   . Hypertension   . Tricuspid regurgitation   . Iron deficiency     hx of  . Polysubstance abuse   . Tobacco abuse   . Leukocytoclastic vasculitis   . Pulmonary HTN     Echo (9/11) with EF 65%, mild LVH, mild AI, mild MR, moderate to possibly severe TR with PA systolic pressure 52 mmHg. Echo (10/12): severe LV hypertrophy, EF 60-65%, mild MR, mild AI, moderate to severe tricuspid regurgitation, PA systolic pressure 38 mmHg.    . Asthma   . CHF (congestive heart failure)     Past Surgical History  Procedure Date  . Ankle surgery     right  .  Cardiac catheterization   . I&d extremity 08/09/2011    Procedure: IRRIGATION AND DEBRIDEMENT EXTREMITY;  Surgeon: Robyne Askew, MD;  Location: [facility] OR;  Service: General;  Laterality: Left;  i & D Left axilla abscess     Allergies:  Allergies  Allergen Reactions  . Ciprofloxacin Itching    Home Medications: Prior to Admission medications   Medication Sig Start Date End Date Taking? Authorizing Provider  albuterol (PROVENTIL  HFA;VENTOLIN HFA) 108 (90 BASE) MCG/ACT inhaler Inhale 2 puffs into the lungs every 4 (four) hours as needed for wheezing or shortness of breath. 11/18/11 11/17/12 Yes Curlene Labrum Readling, MD  amLODipine (NORVASC) 5 MG tablet Take 1 tablet (5 mg total) by mouth daily at 8 pm. For blood pressure. 11/18/11 11/17/12 Yes Curlene Labrum Readling, MD  aspirin 81 MG chewable tablet Chew 1 tablet (81 mg total) by mouth daily. 01/18/12  Yes Kaitlyn Szekalski, PA-C  citalopram (CELEXA) 40 MG tablet Take 1 tablet (40 mg total) by mouth daily. For depression and anxiety. 11/18/11 11/17/12 Yes Curlene Labrum Readling, MD  cloNIDine (CATAPRES) 0.3 MG tablet Take 1 tablet (0.3 mg total) by mouth 2 (two) times daily in the am and at bedtime.. For blood pressure control. 11/18/11 11/17/12 Yes Curlene Labrum Readling, MD  ferrous sulfate 325 (65 FE) MG tablet Take 1 tablet (325 mg total) by mouth 2 (two) times daily with a meal. For anemia. 11/18/11 11/17/12 Yes Curlene Labrum Readling, MD  furosemide (LASIX) 20 MG tablet Take 1 tablet (20 mg total) by mouth 2 (two) times daily. 01/18/12  Yes Kaitlyn Szekalski, PA-C  lisinopril (PRINIVIL,ZESTRIL) 20 MG tablet Take 1 tablet (20 mg total) by mouth daily at 8 pm. For blood pressure. 11/18/11 11/17/12 Yes Curlene Labrum Readling, MD  loratadine (CLARITIN) 10 MG tablet Take 1 tablet (10 mg total) by mouth daily. For seasonal allergies. 11/18/11 11/17/12 Yes Curlene Labrum Readling, MD  Multiple Vitamin (MULTIVITAMIN WITH MINERALS) TABS Take 1 tablet by mouth daily. For nutritional supplementation. 11/18/11  Yes Curlene Labrum Readling, MD  polyethylene glycol powder (GLYCOLAX/MIRALAX) powder Take 17 g by mouth daily as needed. Constipation   Yes Historical Provider, MD  potassium chloride SA (K-DUR,KLOR-CON) 20 MEQ tablet Take 0.5 tablets (10 mEq total) by mouth 2 (two) times daily. 01/18/12  Yes Kaitlyn Szekalski, PA-C  traZODone (DESYREL) 100 MG tablet Take 100 mg by mouth at bedtime. For sleep.   Yes Ronny Bacon, MD    Inpatient  Medications:      (Not in a hospital admission)  Family History  Problem Relation Age of Onset  . Coronary artery disease Mother     DM and breast cancer  . Diabetes Mother   . Cancer Maternal Grandmother     breast     History   Social History  . Marital Status: Single    Spouse Name: N/A    Number of Children: 2  . Years of Education: N/A   Occupational History  . Not on file.   Social History Main Topics  . Smoking status: Current Some Day Smoker -- 0.5 packs/day for 20 years    Types: Cigarettes  . Smokeless tobacco: Never Used  . Alcohol Use: Yes     Comment: pint of wine 2-3 times a week  . Drug Use: 1 per week    Special: "Crack" cocaine     Comment: trying to quit  . Sexually Active: Not on file   Other Topics Concern  .  Not on file   Social History Narrative   Lives with mom, sisters and brothers.     Review of Systems: General: positive for chills, negative for fever, night sweats or weight changes.  Cardiovascular: positive for chest pain, dyspnea on exertion, negative for edema, orthopnea, palpitations, paroxysmal nocturnal dyspnea or shortness of breath Dermatological: negative for rash Respiratory: positive for cough, negative for wheezing Urologic: negative for hematuria Abdominal: negative for nausea, vomiting, diarrhea, bright red blood per rectum, melena, or hematemesis Neurologic:  negative for visual changes, syncope, or dizziness All other systems reviewed and are otherwise negative except as noted above.  Physical Exam: Blood pressure 119/80, pulse 61, temperature 97.7 F (36.5 C), temperature source Oral, resp. rate 17, last menstrual period 03/23/2012, SpO2 100.00%.    General: Appears older than stated age. Well developed, well nourished, in no acute distress. Head: Normocephalic, atraumatic, sclera non-icteric, no xanthomas, nares are without discharge.  Neck: Negative for carotid bruits. JVD not elevated. Lungs: Centralized  rhonchi appreciated. No rales or wheezing. Breathing is unlabored.  Heart:  RRR with S1 S2. No murmurs, rubs, or gallops appreciated. Abdomen: Soft, non-tender, non-distended with normoactive bowel sounds. No hepatomegaly. No rebound/guarding. No obvious abdominal masses. Msk:  Strength and tone appears normal for age. Extremities:  No clubbing, cyanosis or edema.  Distal pedal pulses are 2+ and equal bilaterally. Neuro:  Alert and oriented X 3. Moves all extremities spontaneously. Psych:  Responds to questions appropriately with a normal affect.  Labs: Recent Labs  [facility] 04/06/12 0953   WBC 6.3   HGB 12.3   HCT 37.5   MCV 82.1   PLT 303   Lab 04/06/12 0953  NA 139  K 4.4  CL 103  CO2 24  BUN 16  CREATININE 0.93  CALCIUM 9.7  PROT --  BILITOT --  ALKPHOS --  ALT --  AST --  AMYLASE --  LIPASE --  GLUCOSE 79   Radiology/Studies: Dg Chest 2 View  04/06/2012  *RADIOLOGY REPORT*  Clinical Data: chest pain, shortness of breath  CHEST - 2 VIEW  Comparison: 01/18/2012  Findings: Cardiomediastinal silhouette is stable.  No acute infiltrate or pleural effusion.  No pulmonary edema.  Bony thorax is stable.  IMPRESSION: No active disease.  No significant change.   Original Report Authenticated By: Natasha Mead, M.D.     EKG: NSR, 75 bpm, TW flattening/inversions V5-V6, I, aVL, no ST changes  ASSESSMENT AND PLAN:   47 yo AA female with PMHx s/f CAD (nonobstructive by 10/12 cath), chronic diastolic CHF, h/o CVA, pulmonary HTN with mod-severe TR (2011 echo), HTN and h/o polysubstance abuse who presents to [facility] ED with c/o exertional chest pain and DOE x 2 days.   1. Atypical chest pain 2. CAD, nonobstructive  3. Chronic diastolic CHF 4. H/o CVA 5. Pulmonary HTN  -- mod-severe TR 6. HTN 7. H/o polysubstance abuse  DISCUSSION/PLAN:  Patient's chest pain is fairly atypical. She was in her USOH and fairly active until approximately one week ago when she developed what  sounds like a viral URI. For the past two days, she has noticed left-sided chest pressure on exertion, inspiration and cough and DOE. No antecedent symptoms prior to this- denies arrhythmic or CHF-type symptoms. Suspect chest discomfort may represent a descending URI/bronchitis-type picture. She does have rhonchi on exam. Given her cardiac history, will plan for overnight observation and formal rule out. Will cycle CEs, continue outpatient antihypertensives and monitor BP. Check UDS, lipids, A1C,  TSH. Add Mucinex for cough, albuterol PRN for wheezing/dyspnea and short-course of NSAID. If rules out, plan discharge tomorrow AM with follow-up in the office.     Signed, R. Hurman Horn, PA-C 04/06/2012, 2:45 PM   Patient seen and examined and history reviewed. Agree with above findings and plan. 47 yo BF with history of pulmonary HTN and cocaine use presents to ED today for evaluation of chest pain. She reports a flu like illness 2 weeks ago with URI symptoms, fever, and myalgias. This resolved except for a persistent cough with white sputum. 2 days ago she developed left precordial chest pain. It is constant. Some increased SOB. Worse with cough, deep breathing and activity. Exam reveals some coarse rhonchi on pulmonary exam. No gallop, rub, or murmur. Abdomen is negative. Ecg shows some increased T wave flattening in 1, avl, and V5 compared to 8/13. Initial enzymes are negative. CXR shows NAD. Patient reports she has not used cocaine in one month. She is currently homeless and living at [facility]. Will place in observation overnight. I suspect her pain is related to recent bronchitis. Check serial cardiac enzymes, drug screen, and repeat Ecg in am. Treat with mucinex and analgesic (NSAID).   Theron Arista [facility] 04/06/2012 4:05 PM

## 2012-04-06 NOTE — ED Provider Notes (Signed)
History     CSN: [id number]  Arrival date & time 04/06/12  0900   First MD Initiated Contact with Patient 04/06/12 [phone]      Chief Complaint  Patient presents with  . Chest Pain  . Nausea  . Diarrhea    (Consider location/radiation/quality/duration/timing/severity/associated sxs/prior treatment) HPI Comments: Patient is a 47 year old with an extensive past medical history including previous stroke, CAD, CHF, and HTN who presents with chest pain that started suddenly 2 days ago. Patient reports being at rest and having sudden onset of chest pain that she is unable to characterize was located in the left chest with associated numbness to her left arm. The patient reports associated nausea and SOB. The pain has been continuous for the past 2 days. She has not tried anything for symptom relief. No aggravating/allevaiting factors. Patient denies fever, headache, visual changes, vomiting, diarrhea, abdominal pain, numbness/tingling.    Patient is a 47 y.o. female presenting with chest pain and diarrhea.  Chest Pain Primary symptoms include shortness of breath, nausea and vomiting.    Diarrhea The primary symptoms include nausea, vomiting and diarrhea.    Past Medical History  Diagnosis Date  . Coronary artery disease     Lexiscan myoview (10/12) with significant ST depression upon Lexiscan injection but no ischemia or infarction on perfusion images. Left heart cath (10/12): 30% mid LAD, 30% ostial D1, 50% ostial D2.    . Stroke   . Hypertension   . Tricuspid regurgitation   . Iron deficiency     hx of  . Polysubstance abuse   . Tobacco abuse   . Leukocytoclastic vasculitis   . Pulmonary HTN     Echo (9/11) with EF 65%, mild LVH, mild AI, mild MR, moderate to possibly severe TR with PA systolic pressure 52 mmHg. Echo (10/12): severe LV hypertrophy, EF 60-65%, mild MR, mild AI, moderate to severe tricuspid regurgitation, PA systolic pressure 38 mmHg.    . Asthma   . CHF  (congestive heart failure)     Past Surgical History  Procedure Date  . Ankle surgery     right  . Cardiac catheterization   . I&d extremity 08/09/2011    Procedure: IRRIGATION AND DEBRIDEMENT EXTREMITY;  Surgeon: Robyne Askew, MD;  Location: [facility] OR;  Service: General;  Laterality: Left;  i & D Left axilla abscess    Family History  Problem Relation Age of Onset  . Coronary artery disease Mother     DM and breast cancer  . Diabetes Mother   . Cancer Maternal Grandmother     breast    History  Substance Use Topics  . Smoking status: Current Some Day Smoker -- 0.5 packs/day for 20 years    Types: Cigarettes  . Smokeless tobacco: Never Used  . Alcohol Use: Yes     Comment: pint of wine 2-3 times a week    OB History    Grav Para Term Preterm Abortions TAB SAB Ect Mult Living                  Review of Systems  Respiratory: Positive for shortness of breath.   Cardiovascular: Positive for chest pain.  Gastrointestinal: Positive for nausea, vomiting and diarrhea.  All other systems reviewed and are negative.    Allergies  Ciprofloxacin  Home Medications   Current Outpatient Rx  Name  Route  Sig  Dispense  Refill  . ALBUTEROL SULFATE HFA 108 (90 BASE) MCG/ACT IN  AERS   Inhalation   Inhale 2 puffs into the lungs every 4 (four) hours as needed for wheezing or shortness of breath.   6.7 g   0   . AMLODIPINE BESYLATE 5 MG PO TABS   Oral   Take 1 tablet (5 mg total) by mouth daily at 8 pm. For blood pressure.   30 tablet   0   . ASPIRIN 81 MG PO CHEW   Oral   Chew 1 tablet (81 mg total) by mouth daily.   10 tablet   0   . CITALOPRAM HYDROBROMIDE 40 MG PO TABS   Oral   Take 1 tablet (40 mg total) by mouth daily. For depression and anxiety.   30 tablet   0   . CLONIDINE HCL 0.3 MG PO TABS   Oral   Take 1 tablet (0.3 mg total) by mouth 2 (two) times daily in the am and at bedtime.. For blood pressure control.   60 tablet   0   . FERROUS SULFATE 325  (65 FE) MG PO TABS   Oral   Take 1 tablet (325 mg total) by mouth 2 (two) times daily with a meal. For anemia.   60 tablet   0   . FUROSEMIDE 20 MG PO TABS   Oral   Take 1 tablet (20 mg total) by mouth 2 (two) times daily.   10 tablet   0   . LISINOPRIL 20 MG PO TABS   Oral   Take 1 tablet (20 mg total) by mouth daily at 8 pm. For blood pressure.   30 tablet   0   . LORATADINE 10 MG PO TABS   Oral   Take 1 tablet (10 mg total) by mouth daily. For seasonal allergies.   30 tablet   0   . ADULT MULTIVITAMIN W/MINERALS CH   Oral   Take 1 tablet by mouth daily. For nutritional supplementation.   30 tablet   0   . POLYETHYLENE GLYCOL 3350 PO POWD   Oral   Take 17 g by mouth daily as needed. Constipation         . POTASSIUM CHLORIDE CRYS ER 20 MEQ PO TBCR   Oral   Take 0.5 tablets (10 mEq total) by mouth 2 (two) times daily.   10 tablet   0   . TRAZODONE HCL 100 MG PO TABS   Oral   Take 100 mg by mouth at bedtime. For sleep.           BP 129/89  Pulse 73  Temp 97.7 F (36.5 C) (Oral)  Resp 20  SpO2 100%  Physical Exam  Nursing note and vitals reviewed. Constitutional: She is oriented to person, place, and time. She appears well-developed and well-nourished. No distress.  HENT:  Head: Normocephalic and atraumatic.  Mouth/Throat: Oropharynx is clear and moist. No oropharyngeal exudate.  Eyes: Conjunctivae normal and EOM are normal. Pupils are equal, round, and reactive to light.  Neck: Normal range of motion. Neck supple.  Cardiovascular: Normal rate and regular rhythm.  Exam reveals no gallop and no friction rub.   No murmur heard. Pulmonary/Chest: Effort normal and breath sounds normal. She has no wheezes. She has no rales. She exhibits no tenderness.  Abdominal: Soft. She exhibits no distension. There is no tenderness. There is no rebound and no guarding.  Musculoskeletal: Normal range of motion.  Neurological: She is alert and oriented to person,  place, and time. Coordination normal.  Speech is goal-oriented. Moves limbs without ataxia.   Skin: Skin is warm and dry. She is not diaphoretic.  Psychiatric: She has a normal mood and affect. Her behavior is normal.    ED Course  Procedures (including critical care time)   Date: 04/06/2012  Rate: 75   Rhythm: normal sinus rhythm  QRS Axis: normal  Intervals: normal  ST/T Wave abnormalities: nonspecific T wave changes  Conduction Disutrbances:none  Narrative Interpretation: NSR with nonspecific T wave changes compared to previous  Old EKG Reviewed: changes noted    Labs Reviewed  CBC WITH DIFFERENTIAL - Abnormal; Notable for the following:    RDW 16.2 (*)     All other components within normal limits  BASIC METABOLIC PANEL - Abnormal; Notable for the following:    GFR calc non Af Amer 72 (*)     GFR calc Af Amer 83 (*)     All other components within normal limits  POCT I-STAT TROPONIN I   Dg Chest 2 View  04/06/2012  *RADIOLOGY REPORT*  Clinical Data: chest pain, shortness of breath  CHEST - 2 VIEW  Comparison: 01/18/2012  Findings: Cardiomediastinal silhouette is stable.  No acute infiltrate or pleural effusion.  No pulmonary edema.  Bony thorax is stable.  IMPRESSION: No active disease.  No significant change.   Original Report Authenticated By: Natasha Mead, M.D.      1. Chest pain       MDM  9:50 AM Labs, chest xray pending. Patient will have 324mg  ASA.   12:33 PM Labs and chest xray unremarkable. Troponin negative. [facility] will see the patient.   3:59 PM Cardiology admitting the patient.       Emilia Beck, PA-C 04/06/12 1600

## 2012-04-06 NOTE — ED Provider Notes (Signed)
Medical screening examination/treatment/procedure(s) were conducted as a shared visit with non-physician practitioner(s) and myself.  I personally evaluated the patient during the encounter  See my additional note  [name], MD 04/06/12 [phone]

## 2012-04-06 NOTE — ED Notes (Signed)
Pt states that she has been having chest pain for 2 days. Pt also has NVD for "a few days".

## 2012-04-07 DIAGNOSIS — R0789 Other chest pain: Secondary | ICD-10-CM | POA: Diagnosis present

## 2012-04-07 LAB — BASIC METABOLIC PANEL
CO2: 26 mEq/L (ref 19–32)
Chloride: 102 mEq/L (ref 96–112)
Glucose, Bld: 117 mg/dL — ABNORMAL HIGH (ref 70–99)
Potassium: 3.9 mEq/L (ref 3.5–5.1)
Sodium: 136 mEq/L (ref 135–145)

## 2012-04-07 LAB — CBC
MCH: 26.9 pg (ref 26.0–34.0)
MCHC: 32.7 g/dL (ref 30.0–36.0)
Platelets: 248 10*3/uL (ref 150–400)

## 2012-04-07 LAB — LIPID PANEL
Cholesterol: 162 mg/dL (ref 0–200)
Total CHOL/HDL Ratio: 3.6 RATIO
Triglycerides: 100 mg/dL (ref <150)
VLDL: 20 mg/dL (ref 0–40)

## 2012-04-07 LAB — HEMOGLOBIN A1C
Hgb A1c MFr Bld: 5.5 % (ref <5.7)
Mean Plasma Glucose: 111 mg/dL (ref <117)

## 2012-04-07 LAB — TSH: TSH: 2.063 u[IU]/mL (ref 0.350–4.500)

## 2012-04-07 MED ORDER — AMLODIPINE BESYLATE 5 MG PO TABS: 5.0000 mg | ORAL_TABLET | Freq: Every day | ORAL | Status: DC

## 2012-04-07 MED ORDER — DM-GUAIFENESIN ER 30-600 MG PO TB12: 1.0000 | ORAL_TABLET | Freq: Two times a day (BID) | ORAL | Status: DC

## 2012-04-07 MED ORDER — AZITHROMYCIN 250 MG PO TABS: ORAL_TABLET | ORAL | Status: DC

## 2012-04-07 MED ORDER — INFLUENZA VIRUS VACC SPLIT PF IM SUSP: 0.5000 mL | INTRAMUSCULAR | Status: DC

## 2012-04-07 NOTE — Progress Notes (Signed)
Discharge instructions completed, patient and daughter verbalized understanding.  CHF education reinforced and Heart Failure booklet given to daughter.

## 2012-04-07 NOTE — Progress Notes (Signed)
Patient ID: Morgan Bean, female   DOB: [birth year], 47 y.o.   MRN: [id number]    SUBJECTIVE: Feeling better overall.  Still coughing, gets chest pain with cough.  No dyspnea.  No fever.      [name] amLODipine  5 mg Oral Q2000  . aspirin  81 mg Oral Daily  . citalopram  40 mg Oral Daily  . cloNIDine  0.3 mg Oral BID  . dextromethorphan-guaiFENesin  1 tablet Oral BID  . enoxaparin (LOVENOX) injection  40 mg Subcutaneous Q24H  . ferrous sulfate  325 mg Oral BID WC  . furosemide  20 mg Oral BID  . influenza  inactive virus vaccine  0.5 mL Intramuscular Tomorrow-1000  . lisinopril  20 mg Oral Q2000  . loratadine  10 mg Oral Daily  . multivitamin with minerals  1 tablet Oral Daily  . potassium chloride SA  10 mEq Oral BID  . sodium chloride  3 mL Intravenous Q12H  . traZODone  100 mg Oral QHS      Filed Vitals:   04/06/12 1718 04/06/12 1908 04/06/12 2300 04/07/12 0508  BP: 105/81 119/76 100/60 98/58  Pulse: 65 63 62 63  Temp:  98.5 F (36.9 C)  98.3 F (36.8 C)  TempSrc:  Oral  Oral  Resp: 17 18  20   Height:  5\' 6"  (1.676 m)    Weight:  165 lb 6.4 oz (75.025 kg)  164 lb 10.9 oz (74.7 kg)  SpO2: 100% 100%  100%    Intake/Output Summary (Last 24 hours) at 04/07/12 1019 Last data filed at 04/07/12 0900  Gross per 24 hour  Intake    360 ml  Output      0 ml  Net    360 ml    LABS: Basic Metabolic Panel:  Basename 04/07/12 0221 04/06/12 2016 04/06/12 0953  NA 136 -- 139  K 3.9 -- 4.4  CL 102 -- 103  CO2 26 -- 24  GLUCOSE 117* -- 79  BUN 17 -- 16  CREATININE 1.04 1.00 --  CALCIUM 9.0 -- 9.7  MG -- -- --  PHOS -- -- --   Liver Function Tests: No results found for this basename: AST:2,ALT:2,ALKPHOS:2,BILITOT:2,PROT:2,ALBUMIN:2 in the last 72 hours No results found for this basename: LIPASE:2,AMYLASE:2 in the last 72 hours CBC:  Basename 04/07/12 0221 04/06/12 2016 04/06/12 0953  WBC 5.8 5.8 --  NEUTROABS -- -- 3.3  HGB 11.1* 11.2* --  HCT 33.9* 34.9* --  MCV 82.1  81.9 --  PLT 248 268 --   Cardiac Enzymes:  Basename 04/07/12 0220 04/06/12 2016  CKTOTAL -- --  CKMB -- --  CKMBINDEX -- --  TROPONINI <0.30 <0.30   BNP: No components found with this basename: POCBNP:3 D-Dimer: No results found for this basename: DDIMER:2 in the last 72 hours Hemoglobin A1C:  Basename 04/06/12 2016  HGBA1C 5.5   Fasting Lipid Panel:  Basename 04/07/12 0221  CHOL 162  HDL 45  LDLCALC 97  TRIG 100  CHOLHDL 3.6  LDLDIRECT --   Thyroid Function Tests:  Basename 04/06/12 2016  TSH 2.063  T4TOTAL --  T3FREE --  THYROIDAB --   Anemia Panel: No results found for this basename: VITAMINB12,FOLATE,FERRITIN,TIBC,IRON,RETICCTPCT in the last 72 hours  RADIOLOGY: Dg Chest 2 View  04/06/2012  *RADIOLOGY REPORT*  Clinical Data: chest pain, shortness of breath  CHEST - 2 VIEW  Comparison: 01/18/2012  Findings: Cardiomediastinal silhouette is stable.  No acute infiltrate or pleural effusion.  No  pulmonary edema.  Bony thorax is stable.  IMPRESSION: No active disease.  No significant change.   Original Report Authenticated By: Natasha Mead, M.D.     PHYSICAL EXAM General: NAD Neck: No JVD, no thyromegaly or thyroid nodule.  Lungs: Clear to auscultation bilaterally with normal respiratory effort. CV: Nondisplaced PMI.  Heart regular S1/S2, no S3/S4, no murmur.  No peripheral edema.  No carotid bruit.  Normal pedal pulses.  Abdomen: Soft, nontender, no hepatosplenomegaly, no distention.  Neurologic: Alert and oriented x 3.  Psych: Normal affect. Extremities: No clubbing or cyanosis.   TELEMETRY: Reviewed telemetry pt in NSR  ASSESSMENT AND PLAN:  47 yo with history of difficult to control HTN, cocaine abuse, and diastolic CHF presented with pleuritic CP and cough.   1. CP: Pleuritic, mainly with cough.  Suspect acute bronchitis.  Afebrile, normal WBCs.  No PNA on CXR.  Given prolonged cough/symptoms, will give course of azithromycin.  2. Diastolic CHF: She does  not appear volume overloaded, continue home Lasix dose.  3. HTN: Has been difficult to control in the past.  Actually on the low side this admission.  No dizziness. Continue her home regimen.   Marca Ancona 04/07/2012 10:21 AM

## 2012-04-07 NOTE — Clinical Social Work Psychosocial (Signed)
     Clinical Social Work Department BRIEF PSYCHOSOCIAL ASSESSMENT 04/07/2012  Patient:  Morgan Bean,Morgan Bean     Account Number:  [id number]     Admit date:  04/06/2012  Clinical Social Worker:  Tiburcio Pea  Date/Time:  04/07/2012 03:48 PM  Referred by:  CSW  Date Referred:  04/07/2012 Referred for  Homelessness   Other Referral:   Interview type:  Other - See comment Other interview type:   patient and daughter Morgan Bean    PSYCHOSOCIAL DATA Living Status:  OTHER Admitted from facility:   Level of care:   Primary support name:  Morgan Bean Primary support relationship to patient:  CHILD, ADULT Degree of support available:   good support per patient.    CURRENT CONCERNS  Other Concerns:    SOCIAL WORK ASSESSMENT / PLAN Met with patient and her daughter this afternoon prior to d/c per MD.  Patient states that she has been chronically homeless but is now going to go live with her daughter Morgan Bean.  She states that her condition has deteriorated to where she can no longer live in shelter. Patient has applied for disability and currently has a disability attorney in place. She gets her meds thru the [facility] and plans to go see the [facility] nurse tomorrow to get current meds filled.  Daughter requested HF packet which nursing provided as she wants to become more educated on her mother's condition.  Patient states she is glad that she is going to go stay wiht her daughter and denies any further needs at this time.  CSW will sign off; patient is ready to leave hospital.   Assessment/plan status:   Other assessment/ plan:   Information/referral to community resources:    PATIENTS/FAMILYS RESPONSE TO PLAN OF CARE:

## 2012-04-07 NOTE — Progress Notes (Signed)
Error, need d/c summ

## 2012-04-07 NOTE — Discharge Summary (Signed)
CARDIOLOGY DISCHARGE SUMMARY   Patient ID: Morgan Bean MRN: [id number] DOB/AGE: Aug 07, 1965 47 y.o.  Admit date: 04/06/2012 Discharge date: 04/07/2012  Primary Discharge Diagnosis:  *Acute bronchitis Secondary Discharge Diagnosis:  Chest pain, mid-sternal  Hypertension  Chronic diastolic heart failure  CAD (coronary artery disease)  Pulmonary hypertension  Polysubstance abuse  Hospital Course:  Ms Morgan Bean is a 47 year old female with a history of non-obstructive CAD. She developed a cough with clear sputum and chills last week. She feels somewhat improved from that but developed chest pain and came to the hospital where she was admitted for further evaluation and treatment.   Her cardiac enzymes were negative for MI. Her ECG was not acute. Her WBCs were normal and she was afebrile. Her CXR showed no active disease but she continued to have a productive cough. A UDS was performed and she was positive for opiates and barbiturates but had received a GI cocktail (with Donnatal) and morphine several hours before the test was performed.   On 04/07/2012, Dr Shirlee Latch evaluated Ms Morgan Bean. Her symptoms had improved, but with the productive cough, he recommended a course of antibiotics to treat bronchitis. With the improvement in her symptoms, she is considered stable for discharge, to follow up as an outpatient.   Labs:  Lab Results  Component Value Date   WBC 5.8 04/07/2012   HGB 11.1* 04/07/2012   HCT 33.9* 04/07/2012   MCV 82.1 04/07/2012   PLT 248 04/07/2012     Lab 04/07/12 0221  NA 136  K 3.9  CL 102  CO2 26  BUN 17  CREATININE 1.04  CALCIUM 9.0  PROT --  BILITOT --  ALKPHOS --  ALT --  AST --  GLUCOSE 117*    Basename 04/07/12 0220 04/06/12 2016  CKTOTAL -- --  CKMB -- --  CKMBINDEX -- --  TROPONINI <0.30 <0.30   Lipid Panel     Component Value Date/Time   CHOL 162 04/07/2012 0221   TRIG 100 04/07/2012 0221   HDL 45 04/07/2012 0221   CHOLHDL 3.6 04/07/2012 0221   VLDL 20  04/07/2012 0221   LDLCALC 97 04/07/2012 0221    Drugs of Abuse     Component Value Date/Time   LABOPIA POSITIVE* 04/06/2012 1550   COCAINSCRNUR NONE DETECTED 04/06/2012 1550   LABBENZ NONE DETECTED 04/06/2012 1550   AMPHETMU NONE DETECTED 04/06/2012 1550   THCU NONE DETECTED 04/06/2012 1550   LABBARB POSITIVE* 04/06/2012 1550     Radiology: Dg Chest 2 View 04/06/2012  *RADIOLOGY REPORT*  Clinical Data: chest pain, shortness of breath  CHEST - 2 VIEW  Comparison: 01/18/2012  Findings: Cardiomediastinal silhouette is stable.  No acute infiltrate or pleural effusion.  No pulmonary edema.  Bony thorax is stable.  IMPRESSION: No active disease.  No significant change.   Original Report Authenticated By: Natasha Mead, M.D.     EKG:  07-Apr-2012 07:07:58 [facility]-47 ROUTINE RECORD Sinus bradycardia Otherwise normal ECG 50mm/s 86mm/mV 100Hz  8.0.1 12SL 239 CID: 1 Referred by: DOCTOR Unconfirmed Vent. rate 53 BPM PR interval 184 ms QRS duration 96 ms QT/QTc 458/429 ms P-R-T axes 69 42 63  FOLLOW UP PLANS AND APPOINTMENTS Allergies  Allergen Reactions  . Ciprofloxacin Itching     Medication List     As of 04/07/2012  2:12 PM    TAKE these medications         albuterol 108 (90 BASE) MCG/ACT inhaler   Commonly known as: PROVENTIL  HFA;VENTOLIN HFA   Inhale 2 puffs into the lungs every 4 (four) hours as needed for wheezing or shortness of breath.      amLODipine 5 MG tablet   Commonly known as: NORVASC   Take 1 tablet (5 mg total) by mouth daily at 8 pm. For blood pressure.      aspirin 81 MG chewable tablet   Chew 1 tablet (81 mg total) by mouth daily.      azithromycin 250 MG tablet   Commonly known as: ZITHROMAX   Take 2 tablets today, then 1 tablet daily till completed.      citalopram 40 MG tablet   Commonly known as: CELEXA   Take 1 tablet (40 mg total) by mouth daily. For depression and anxiety.      cloNIDine 0.3 MG tablet   Commonly known as: CATAPRES    Take 1 tablet (0.3 mg total) by mouth 2 (two) times daily in the am and at bedtime.. For blood pressure control.      dextromethorphan-guaiFENesin 30-600 MG per 12 hr tablet   Commonly known as: MUCINEX DM   Take 1 tablet by mouth 2 (two) times daily.      ferrous sulfate 325 (65 FE) MG tablet   Take 1 tablet (325 mg total) by mouth 2 (two) times daily with a meal. For anemia.      furosemide 20 MG tablet   Commonly known as: LASIX   Take 1 tablet (20 mg total) by mouth 2 (two) times daily.      lisinopril 20 MG tablet   Commonly known as: PRINIVIL,ZESTRIL   Take 1 tablet (20 mg total) by mouth daily at 8 pm. For blood pressure.      loratadine 10 MG tablet   Commonly known as: CLARITIN   Take 1 tablet (10 mg total) by mouth daily. For seasonal allergies.      multivitamin with minerals Tabs   Take 1 tablet by mouth daily. For nutritional supplementation.      polyethylene glycol powder powder   Commonly known as: GLYCOLAX/MIRALAX   Take 17 g by mouth daily as needed. Constipation      potassium chloride SA 20 MEQ tablet   Commonly known as: K-DUR,KLOR-CON   Take 0.5 tablets (10 mEq total) by mouth 2 (two) times daily.      traZODone 100 MG tablet   Commonly known as: DESYREL   Take 100 mg by mouth at bedtime. For sleep.          Discharge Orders    Future Appointments: Provider: Department: Dept Phone: Center:   04/22/2012 8:50 AM Beatrice Lecher, PA [facility] Main [facility] [place]) [phone] [id number]     Follow-up Information    Follow up with Tereso Newcomer, PA. On 04/22/2012. (see for Dr Shirlee Latch at 8:50 am.)    Contact information:   [address] [place] [address] [phone]          BRING ALL MEDICATIONS WITH YOU TO FOLLOW UP APPOINTMENTS  Time spent with patient to include physician time:  36 min Signed: Theodore Demark 04/07/2012, 2:12 PM Co-Sign MD

## 2012-04-07 NOTE — Progress Notes (Signed)
Utilization Review Completed.   Ivah Girardot, RN, BSN Nurse Case Manager  [phone]  

## 2012-04-22 ENCOUNTER — Encounter: Payer: Self-pay | Admitting: Physician Assistant

## 2012-05-07 ENCOUNTER — Other Ambulatory Visit: Payer: Self-pay

## 2012-06-13 ENCOUNTER — Encounter (HOSPITAL_COMMUNITY): Payer: Self-pay | Admitting: Emergency Medicine

## 2012-06-13 ENCOUNTER — Emergency Department (HOSPITAL_COMMUNITY)
Admission: EM | Admit: 2012-06-13 | Discharge: 2012-06-13 | Disposition: A | Discharge status: Home / Self Care | Payer: Self-pay | Attending: Emergency Medicine | Admitting: Emergency Medicine

## 2012-06-13 DIAGNOSIS — D509 Iron deficiency anemia, unspecified: Secondary | ICD-10-CM | POA: Insufficient documentation

## 2012-06-13 DIAGNOSIS — J3489 Other specified disorders of nose and nasal sinuses: Secondary | ICD-10-CM | POA: Insufficient documentation

## 2012-06-13 DIAGNOSIS — R059 Cough, unspecified: Secondary | ICD-10-CM | POA: Insufficient documentation

## 2012-06-13 DIAGNOSIS — Z7982 Long term (current) use of aspirin: Secondary | ICD-10-CM | POA: Insufficient documentation

## 2012-06-13 DIAGNOSIS — Z8673 Personal history of transient ischemic attack (TIA), and cerebral infarction without residual deficits: Secondary | ICD-10-CM | POA: Insufficient documentation

## 2012-06-13 DIAGNOSIS — F1910 Other psychoactive substance abuse, uncomplicated: Secondary | ICD-10-CM | POA: Insufficient documentation

## 2012-06-13 DIAGNOSIS — Z9861 Coronary angioplasty status: Secondary | ICD-10-CM | POA: Insufficient documentation

## 2012-06-13 DIAGNOSIS — J45909 Unspecified asthma, uncomplicated: Secondary | ICD-10-CM | POA: Insufficient documentation

## 2012-06-13 DIAGNOSIS — I10 Essential (primary) hypertension: Secondary | ICD-10-CM | POA: Insufficient documentation

## 2012-06-13 DIAGNOSIS — Z79899 Other long term (current) drug therapy: Secondary | ICD-10-CM | POA: Insufficient documentation

## 2012-06-13 DIAGNOSIS — F17200 Nicotine dependence, unspecified, uncomplicated: Secondary | ICD-10-CM | POA: Insufficient documentation

## 2012-06-13 DIAGNOSIS — R05 Cough: Secondary | ICD-10-CM | POA: Insufficient documentation

## 2012-06-13 DIAGNOSIS — J029 Acute pharyngitis, unspecified: Secondary | ICD-10-CM

## 2012-06-13 DIAGNOSIS — Z8679 Personal history of other diseases of the circulatory system: Secondary | ICD-10-CM | POA: Insufficient documentation

## 2012-06-13 DIAGNOSIS — I509 Heart failure, unspecified: Secondary | ICD-10-CM | POA: Insufficient documentation

## 2012-06-13 DIAGNOSIS — I2510 Atherosclerotic heart disease of native coronary artery without angina pectoris: Secondary | ICD-10-CM | POA: Insufficient documentation

## 2012-06-13 MED ORDER — IBUPROFEN 400 MG PO TABS
600.0000 mg | ORAL_TABLET | Freq: Once | ORAL | Status: AC
  Administered 2012-06-13: 600 mg via ORAL
  Filled 2012-06-13: qty 1

## 2012-06-13 NOTE — ED Notes (Addendum)
Pt reports sore throat and swollen glands off and on for 3-4 months.  Pt states this episode started fri.  Pain 3/10 with 10/10 with swallowing. No difficulty swolling just pain. Resp intact with.  Pt reports productive cough and is a current smoker.  Pt alert oriented X4

## 2012-06-13 NOTE — ED Notes (Signed)
Onset 3 days ago sore throat and swollen lymph nodes.  States symptoms appears every 3-4 months and have not seen a Doctor for complaint.  Airway intact bilateral equal chest rise and fall.

## 2012-06-13 NOTE — ED Provider Notes (Signed)
History     CSN: [id number]  Arrival date & time 06/13/12  0907   First MD Initiated Contact with Patient 06/13/12 [phone]      Chief Complaint  Patient presents with  . Sore Throat    (Consider location/radiation/quality/duration/timing/severity/associated sxs/prior treatment) Patient is a 47 y.o. female presenting with pharyngitis. The history is provided by the patient.  Sore Throat Pertinent negatives include no headaches.  pt c/o sore throat x 2-3 days. Constant, dull, moderate. Hurts to swallow, but able to swallow without difficulty. No sob or stridor. No unilateral throat pain or swelling. Grandchild w recent sore throat ?possible strep. No other known ill contacts. Mild nasal congestion, rare non prod cough. No body aches. No rash. No headaches.     Past Medical History  Diagnosis Date  . Coronary artery disease     Lexiscan myoview (10/12) with significant ST depression upon Lexiscan injection but no ischemia or infarction on perfusion images. Left heart cath (10/12): 30% mid LAD, 30% ostial D1, 50% ostial D2.    . Stroke   . Hypertension   . Tricuspid regurgitation   . Iron deficiency     hx of  . Polysubstance abuse   . Tobacco abuse   . Leukocytoclastic vasculitis   . Pulmonary HTN     Echo (9/11) with EF 65%, mild LVH, mild AI, mild MR, moderate to possibly severe TR with PA systolic pressure 52 mmHg. Echo (10/12): severe LV hypertrophy, EF 60-65%, mild MR, mild AI, moderate to severe tricuspid regurgitation, PA systolic pressure 38 mmHg.    . Asthma   . CHF (congestive heart failure)     Past Surgical History  Procedure Laterality Date  . Ankle surgery      right  . Cardiac catheterization    . I&d extremity  08/09/2011    Procedure: IRRIGATION AND DEBRIDEMENT EXTREMITY;  Surgeon: Robyne Askew, MD;  Location: [facility] OR;  Service: General;  Laterality: Left;  i & D Left axilla abscess    Family History  Problem Relation Age of Onset  . Coronary artery  disease Mother     DM and breast cancer  . Diabetes Mother   . Cancer Maternal Grandmother     breast    History  Substance Use Topics  . Smoking status: Current Some Day Smoker -- 0.50 packs/day for 20 years    Types: Cigarettes  . Smokeless tobacco: Never Used  . Alcohol Use: Yes     Comment: pint of wine 2-3 times a week    OB History   Grav Para Term Preterm Abortions TAB SAB Ect Mult Living                  Review of Systems  Constitutional: Negative for fever and chills.  HENT: Positive for sore throat.   Eyes: Negative for discharge and redness.  Gastrointestinal: Negative for vomiting and diarrhea.  Skin: Negative for rash.  Neurological: Negative for headaches.    Allergies  Ciprofloxacin  Home Medications   Current Outpatient Rx  Name  Route  Sig  Dispense  Refill  . albuterol (PROVENTIL HFA;VENTOLIN HFA) 108 (90 BASE) MCG/ACT inhaler   Inhalation   Inhale 2 puffs into the lungs every 4 (four) hours as needed for wheezing or shortness of breath.   6.7 g   0   . amLODipine (NORVASC) 5 MG tablet   Oral   Take 1 tablet (5 mg total) by mouth daily at 8  pm. For blood pressure.   30 tablet   11   . aspirin 81 MG chewable tablet   Oral   Chew 1 tablet (81 mg total) by mouth daily.   10 tablet   0   . azithromycin (ZITHROMAX) 250 MG tablet      Take 2 tablets today, then 1 tablet daily till completed.   6 each   0   . citalopram (CELEXA) 40 MG tablet   Oral   Take 1 tablet (40 mg total) by mouth daily. For depression and anxiety.   30 tablet   0   . cloNIDine (CATAPRES) 0.3 MG tablet   Oral   Take 1 tablet (0.3 mg total) by mouth 2 (two) times daily in the am and at bedtime.. For blood pressure control.   60 tablet   0   . dextromethorphan-guaiFENesin (MUCINEX DM) 30-600 MG per 12 hr tablet   Oral   Take 1 tablet by mouth 2 (two) times daily.   30 tablet   0   . ferrous sulfate 325 (65 FE) MG tablet   Oral   Take 1 tablet (325 mg  total) by mouth 2 (two) times daily with a meal. For anemia.   60 tablet   0   . furosemide (LASIX) 20 MG tablet   Oral   Take 1 tablet (20 mg total) by mouth 2 (two) times daily.   10 tablet   0   . lisinopril (PRINIVIL,ZESTRIL) 20 MG tablet   Oral   Take 1 tablet (20 mg total) by mouth daily at 8 pm. For blood pressure.   30 tablet   0   . loratadine (CLARITIN) 10 MG tablet   Oral   Take 1 tablet (10 mg total) by mouth daily. For seasonal allergies.   30 tablet   0   . Multiple Vitamin (MULTIVITAMIN WITH MINERALS) TABS   Oral   Take 1 tablet by mouth daily. For nutritional supplementation.   30 tablet   0   . polyethylene glycol powder (GLYCOLAX/MIRALAX) powder   Oral   Take 17 g by mouth daily as needed. Constipation         . potassium chloride SA (K-DUR,KLOR-CON) 20 MEQ tablet   Oral   Take 0.5 tablets (10 mEq total) by mouth 2 (two) times daily.   10 tablet   0   . traZODone (DESYREL) 100 MG tablet   Oral   Take 100 mg by mouth at bedtime. For sleep.           BP 155/97  Pulse 87  Temp(Src) 97.8 F (36.6 C) (Oral)  Resp 18  Ht 5\' 6"  (1.676 m)  Wt 170 lb (77.111 kg)  BMI 27.45 kg/m2  SpO2 100%  Physical Exam  Nursing note and vitals reviewed. Constitutional: She appears well-developed and well-nourished. No distress.  HENT:  Nose: Nose normal.  Pharynx erythematous. No exudate. No asymmetric swelling or abscess. No trismus. No swelling, or pain, or tenderness to floor of mouth.   Eyes: Conjunctivae are normal. No scleral icterus.  Neck: Neck supple. No tracheal deviation present.  Ant cerv l/a, no post cerv l/a. No stiffness or rigidity.   Cardiovascular: Normal rate.   Pulmonary/Chest: Effort normal. No respiratory distress.  Abdominal: Soft. Normal appearance. She exhibits no distension. There is no tenderness.  No hsm  Musculoskeletal: She exhibits no edema.  Lymphadenopathy:    She has cervical adenopathy.  Neurological: She is alert.  Skin: Skin is warm and dry. No rash noted.  Psychiatric: She has a normal mood and affect.    ED Course  Procedures (including critical care time)   Results for orders placed during the hospital encounter of 06/13/12  RAPID STREP SCREEN      Result Value Range   Streptococcus, Group A Screen (Direct) NEGATIVE  NEGATIVE      MDM  Strep screen.   Confirmed only allergy is cipro.  Motrin po.   Reviewed nursing notes and prior charts for additional history.           Suzi Roots, MD 06/13/12 [phone]

## 2012-06-13 NOTE — Discharge Instructions (Signed)
Your strep test is normal/negative. Drink plenty of fluids. Take tylenol/motrin as need for pain. Use throat lozenges as need for symptom relief.  Follow up with primary care doctor in 3-4 days if symptoms fail to improve/resolve. Also, your blood pressure is high today - follow up with primary care doctor for recheck of blood pressure in the next couple weeks.  Return to ER right away if worse, unable to swallow, trouble breathing, other concern.      Sore Throat Sore throats may be caused by bacteria and viruses. They may also be caused by:  Smoking.  Pollution.  Allergies. If a sore throat is due to strep infection (a bacterial infection), you may need:  A throat swab.  A culture test to verify the strep infection. You will need one of these:  An antibiotic shot.  Oral medicine for a full 10 days. Strep infection is very contagious. A doctor should check any close contacts who have a sore throat or fever. A sore throat caused by a virus infection will usually last only 3-4 days. Antibiotics will not treat a viral sore throat.  Infectious mononucleosis (a viral disease), however, can cause a sore throat that lasts for up to 3 weeks. Mononucleosis can be diagnosed with blood tests. You must have been sick for at least 1 week in order for the test to give accurate results. HOME CARE INSTRUCTIONS   To treat a sore throat, take mild pain medicine.  Increase your fluids.  Eat a soft diet.  Do not smoke.  Gargling with warm water or salt water (1 tsp. salt in 8 oz. water) can be helpful.  Try throat sprays or lozenges or sucking on hard candy to ease the symptoms. Call your doctor if your sore throat lasts longer than 1 week.  SEEK IMMEDIATE MEDICAL CARE IF:  You have difficulty breathing.  You have increased swelling in the throat.  You have pain so severe that you are unable to swallow fluids or your saliva.  You have a severe headache, a high fever, vomiting, or a  red rash. Document Released: 04/16/2004 Document Revised: 06/01/2011 Document Reviewed: 02/24/2007      [facility] Patient Information 2013 [place], [place].    Salt Water Gargle This solution will help make your mouth and throat feel better. HOME CARE INSTRUCTIONS   Mix 1 teaspoon of salt in 8 ounces of warm water.  Gargle with this solution as much or often as you need or as directed. Swish and gargle gently if you have any sores or wounds in your mouth.  Do not swallow this mixture. Document Released: 12/12/2003 Document Revised: 06/01/2011 Document Reviewed: 05/04/2008 [facility] Patient Information 2013 [place], [place].    Viral Pharyngitis Viral pharyngitis is a viral infection that produces redness, pain, and swelling (inflammation) of the throat. It can spread from person to person (contagious). CAUSES Viral pharyngitis is caused by inhaling a large amount of certain germs called viruses. Many different viruses cause viral pharyngitis. SYMPTOMS Symptoms of viral pharyngitis include:  Sore throat.  Tiredness.  Stuffy nose.  Low-grade fever.  Congestion.  Cough. TREATMENT Treatment includes rest, drinking plenty of fluids, and the use of over-the-counter medication (approved by your caregiver). HOME CARE INSTRUCTIONS   Drink enough fluids to keep your urine clear or pale yellow.  Eat soft, cold foods such as ice cream, frozen ice pops, or gelatin dessert.  Gargle with warm salt water (1 tsp salt per 1 qt of water).  If over age  7, throat lozenges may be used safely.  Only take over-the-counter or prescription medicines for pain, discomfort, or fever as directed by your caregiver. Do not take aspirin. To help prevent spreading viral pharyngitis to others, avoid:  Mouth-to-mouth contact with others.  Sharing utensils for eating and drinking.  Coughing around others. SEEK MEDICAL CARE IF:   You are better in a few days, then become worse.  You have a  fever or pain not helped by pain medicines.  There are any other changes that concern you. Document Released: 12/17/2004 Document Revised: 06/01/2011 Document Reviewed: 05/15/2010 [facility] Patient Information 2013 [place], [place].   Hypertension As your heart beats, it forces blood through your arteries. This force is your blood pressure. If the pressure is too high, it is called hypertension (HTN) or high blood pressure. HTN is dangerous because you may have it and not know it. High blood pressure may mean that your heart has to work harder to pump blood. Your arteries may be narrow or stiff. The extra work puts you at risk for heart disease, stroke, and other problems.  Blood pressure consists of two numbers, a higher number over a lower, 110/72, for example. It is stated as "110 over 72." The ideal is below 120 for the top number (systolic) and under 80 for the bottom (diastolic). Write down your blood pressure today. You should pay close attention to your blood pressure if you have certain conditions such as:  Heart failure.  Prior heart attack.  Diabetes  Chronic kidney disease.  Prior stroke.  Multiple risk factors for heart disease. To see if you have HTN, your blood pressure should be measured while you are seated with your arm held at the level of the heart. It should be measured at least twice. A one-time elevated blood pressure reading (especially in the Emergency Department) does not mean that you need treatment. There may be conditions in which the blood pressure is different between your right and left arms. It is important to see your caregiver soon for a recheck. Most people have essential hypertension which means that there is not a specific cause. This type of high blood pressure may be lowered by changing lifestyle factors such as:  Stress.  Smoking.  Lack of exercise.  Excessive weight.  Drug/tobacco/alcohol use.  Eating less salt. Most people do not have  symptoms from high blood pressure until it has caused damage to the body. Effective treatment can often prevent, delay or reduce that damage. TREATMENT  When a cause has been identified, treatment for high blood pressure is directed at the cause. There are a large number of medications to treat HTN. These fall into several categories, and your caregiver will help you select the medicines that are best for you. Medications may have side effects. You should review side effects with your caregiver. If your blood pressure stays high after you have made lifestyle changes or started on medicines,   Your medication(s) may need to be changed.  Other problems may need to be addressed.  Be certain you understand your prescriptions, and know how and when to take your medicine.  Be sure to follow up with your caregiver within the time frame advised (usually within two weeks) to have your blood pressure rechecked and to review your medications.  If you are taking more than one medicine to lower your blood pressure, make sure you know how and at what times they should be taken. Taking two medicines at the same time  can result in blood pressure that is too low. SEEK IMMEDIATE MEDICAL CARE IF:  You develop a severe headache, blurred or changing vision, or confusion.  You have unusual weakness or numbness, or a faint feeling.  You have severe chest or abdominal pain, vomiting, or breathing problems. MAKE SURE YOU:   Understand these instructions.  Will watch your condition.  Will get help right away if you are not doing well or get worse. Document Released: 03/09/2005 Document Revised: 06/01/2011 Document Reviewed: 10/28/2007 [facility] Patient Information 2013 [place], [place].

## 2012-06-15 ENCOUNTER — Encounter (HOSPITAL_COMMUNITY): Payer: Self-pay | Admitting: *Deleted

## 2012-06-15 ENCOUNTER — Emergency Department (HOSPITAL_COMMUNITY)
Admission: EM | Admit: 2012-06-15 | Discharge: 2012-06-15 | Disposition: A | Discharge status: Home / Self Care | Payer: No Typology Code available for payment source | Source: Home / Self Care

## 2012-06-15 DIAGNOSIS — J209 Acute bronchitis, unspecified: Secondary | ICD-10-CM

## 2012-06-15 DIAGNOSIS — R079 Chest pain, unspecified: Secondary | ICD-10-CM

## 2012-06-15 DIAGNOSIS — I2510 Atherosclerotic heart disease of native coronary artery without angina pectoris: Secondary | ICD-10-CM

## 2012-06-15 DIAGNOSIS — L0291 Cutaneous abscess, unspecified: Secondary | ICD-10-CM

## 2012-06-15 MED ORDER — ASPIRIN 81 MG PO CHEW: 81.0000 mg | CHEWABLE_TABLET | Freq: Every day | ORAL | Status: DC

## 2012-06-15 MED ORDER — FUROSEMIDE 20 MG PO TABS: 20.0000 mg | ORAL_TABLET | Freq: Two times a day (BID) | ORAL | Status: DC

## 2012-06-15 MED ORDER — CITALOPRAM HYDROBROMIDE 40 MG PO TABS: 40.0000 mg | ORAL_TABLET | Freq: Every day | ORAL | Status: DC

## 2012-06-15 MED ORDER — POTASSIUM CHLORIDE CRYS ER 10 MEQ PO TBCR: 10.0000 meq | EXTENDED_RELEASE_TABLET | Freq: Two times a day (BID) | ORAL | Status: DC

## 2012-06-15 MED ORDER — AMLODIPINE BESYLATE 5 MG PO TABS: 5.0000 mg | ORAL_TABLET | Freq: Every day | ORAL | Status: DC

## 2012-06-15 MED ORDER — LORATADINE 10 MG PO TABS: 10.0000 mg | ORAL_TABLET | Freq: Every day | ORAL | Status: DC

## 2012-06-15 MED ORDER — TRAZODONE HCL 100 MG PO TABS: 100.0000 mg | ORAL_TABLET | Freq: Every day | ORAL | Status: DC

## 2012-06-15 MED ORDER — ALBUTEROL SULFATE HFA 108 (90 BASE) MCG/ACT IN AERS: 2.0000 | INHALATION_SPRAY | RESPIRATORY_TRACT | Status: DC | PRN

## 2012-06-15 MED ORDER — AMOXICILLIN 500 MG PO TABS: 500.0000 mg | ORAL_TABLET | Freq: Two times a day (BID) | ORAL | Status: DC

## 2012-06-15 MED ORDER — CLONIDINE HCL 0.3 MG PO TABS: 0.3000 mg | ORAL_TABLET | ORAL | Status: DC

## 2012-06-15 MED ORDER — LISINOPRIL 20 MG PO TABS: 20.0000 mg | ORAL_TABLET | Freq: Every day | ORAL | Status: DC

## 2012-06-15 MED ORDER — FERROUS SULFATE 325 (65 FE) MG PO TABS: 325.0000 mg | ORAL_TABLET | Freq: Two times a day (BID) | ORAL | Status: DC

## 2012-06-15 NOTE — ED Notes (Signed)
New patient to establish care. Medication refill.

## 2012-06-15 NOTE — ED Provider Notes (Signed)
History     CSN: [id number]  Arrival date & time 06/15/12  0907   None     Chief Complaint  Patient presents with  . Establish Care    (Consider location/radiation/quality/duration/timing/severity/associated sxs/prior treatment) HPI Patient is 47 year old female who presents with main concern of ongoing subjective fevers and chills, sore throat, cough productive of yellow sputum, 10 days in duration, no specific aggravating or alleviating factors, no similar events in the past. Patient denies chest pain or shortness of breath, no other systemic symptoms, no headaches or stiff neck, no ear pain or discharge, no abdominal or urinary concerns. She denies known sick contacts or exposures. Past Medical History  Diagnosis Date  . Coronary artery disease     Lexiscan myoview (10/12) with significant ST depression upon Lexiscan injection but no ischemia or infarction on perfusion images. Left heart cath (10/12): 30% mid LAD, 30% ostial D1, 50% ostial D2.    . Stroke   . Hypertension   . Tricuspid regurgitation   . Iron deficiency     hx of  . Polysubstance abuse   . Tobacco abuse   . Leukocytoclastic vasculitis   . Pulmonary HTN     Echo (9/11) with EF 65%, mild LVH, mild AI, mild MR, moderate to possibly severe TR with PA systolic pressure 52 mmHg. Echo (10/12): severe LV hypertrophy, EF 60-65%, mild MR, mild AI, moderate to severe tricuspid regurgitation, PA systolic pressure 38 mmHg.    . Asthma   . CHF (congestive heart failure)     Past Surgical History  Procedure Laterality Date  . Ankle surgery      right  . Cardiac catheterization    . I&d extremity  08/09/2011    Procedure: IRRIGATION AND DEBRIDEMENT EXTREMITY;  Surgeon: Robyne Askew, MD;  Location: [facility] OR;  Service: General;  Laterality: Left;  i & D Left axilla abscess    Family History  Problem Relation Age of Onset  . Coronary artery disease Mother     DM and breast cancer  . Diabetes Mother   . Cancer Maternal  Grandmother     breast    History  Substance Use Topics  . Smoking status: Current Some Day Smoker -- 0.50 packs/day for 20 years    Types: Cigarettes  . Smokeless tobacco: Never Used  . Alcohol Use: Yes     Comment: pint of wine 2-3 times a week    OB History   Grav Para Term Preterm Abortions TAB SAB Ect Mult Living                  Review of Systems  Constitutional: Negative for diaphoresis, activity change, appetite change and fatigue.  HENT: Negative for ear pain, nosebleeds, congestion, facial swelling, rhinorrhea, neck pain, neck stiffness and ear discharge.   Eyes: Negative for pain, discharge, redness, itching and visual disturbance.  Respiratory: Negative for choking, chest tightness, shortness of breath, wheezing and stridor.   Cardiovascular: Negative for chest pain, palpitations and leg swelling.  Gastrointestinal: Negative for abdominal distention.  Genitourinary: Negative for dysuria, urgency, frequency, hematuria, flank pain, decreased urine volume, difficulty urinating and dyspareunia.  Musculoskeletal: Negative for back pain, joint swelling, arthralgias and gait problem.  Neurological: Negative for dizziness, tremors, seizures, syncope, facial asymmetry, speech difficulty, weakness, light-headedness, numbness and headaches.  Hematological: Negative for adenopathy. Does not bruise/bleed easily.  Psychiatric/Behavioral: Negative for hallucinations, behavioral problems, confusion, dysphoric mood, decreased concentration and agitation.    Allergies  Ciprofloxacin  Home Medications   Current Outpatient Rx  Name  Route  Sig  Dispense  Refill  . albuterol (PROVENTIL HFA;VENTOLIN HFA) 108 (90 BASE) MCG/ACT inhaler   Inhalation   Inhale 2 puffs into the lungs every 4 (four) hours as needed for wheezing or shortness of breath.   6.7 g   0   . amLODipine (NORVASC) 5 MG tablet   Oral   Take 1 tablet (5 mg total) by mouth daily at 8 pm. For blood pressure.   30  tablet   11   . amoxicillin (AMOXIL) 500 MG tablet   Oral   Take 1 tablet (500 mg total) by mouth 2 (two) times daily.   10 tablet   0   . aspirin 81 MG chewable tablet   Oral   Chew 1 tablet (81 mg total) by mouth daily.   10 tablet   0   . citalopram (CELEXA) 40 MG tablet   Oral   Take 1 tablet (40 mg total) by mouth daily. For depression and anxiety.   30 tablet   0   . cloNIDine (CATAPRES) 0.3 MG tablet   Oral   Take 1 tablet (0.3 mg total) by mouth 2 (two) times daily in the am and at bedtime.. For blood pressure control.   60 tablet   0   . ferrous sulfate 325 (65 FE) MG tablet   Oral   Take 1 tablet (325 mg total) by mouth 2 (two) times daily with a meal. For anemia.   60 tablet   0   . furosemide (LASIX) 20 MG tablet   Oral   Take 1 tablet (20 mg total) by mouth 2 (two) times daily.   10 tablet   0   . lisinopril (PRINIVIL,ZESTRIL) 20 MG tablet   Oral   Take 1 tablet (20 mg total) by mouth daily at 8 pm. For blood pressure.   30 tablet   0   . loratadine (CLARITIN) 10 MG tablet   Oral   Take 1 tablet (10 mg total) by mouth daily. For seasonal allergies.   30 tablet   0   . Multiple Vitamin (MULTIVITAMIN WITH MINERALS) TABS   Oral   Take 1 tablet by mouth daily. For nutritional supplementation.   30 tablet   0   . polyethylene glycol powder (GLYCOLAX/MIRALAX) powder   Oral   Take 17 g by mouth daily as needed. Constipation         . potassium chloride (K-DUR,KLOR-CON) 10 MEQ tablet   Oral   Take 1 tablet (10 mEq total) by mouth 2 (two) times daily with a meal.   60 tablet   0   . traZODone (DESYREL) 100 MG tablet   Oral   Take 1 tablet (100 mg total) by mouth at bedtime. For sleep.   30 tablet   1     BP 157/108  Pulse 78  Temp(Src) 97.5 F (36.4 C) (Oral)  SpO2 100%  LMP 06/12/2012  Physical Exam  Constitutional: Appears well-developed and well-nourished. No distress.  HENT: Normocephalic. External right and left ear  normal. Oropharynx is clear and moist.  Eyes: Conjunctivae and EOM are normal. PERRLA, no scleral icterus.  Neck: Normal ROM. Neck supple. No JVD. No tracheal deviation. No thyromegaly.  CVS: RRR, S1/S2 +, no murmurs, no gallops, no carotid bruit.  Pulmonary: Effort and breath sounds normal, no stridor, rhonchi, wheezes, rales.  Abdominal: Soft. BS +,  no distension, tenderness, rebound  or guarding.  Musculoskeletal: Normal range of motion. No edema and no tenderness.  Lymphadenopathy: No lymphadenopathy noted, cervical, inguinal. Neuro: Alert. Normal reflexes, muscle tone coordination. No cranial nerve deficit. Skin: Skin is warm and dry. No rash noted. Not diaphoretic. No erythema. No pallor.  Psychiatric: Normal mood and affect. Behavior, judgment, thought content normal.    ED Course  Procedures (including critical care time)  Labs Reviewed - No data to display No results found.   1. Acute bronchitis   - patient symptoms consistent with acute bronchitis, coughing noted on exam and productive of clear sputum - We'll go ahead and treat symptomatically with antitussives, antibiotics for 5 days - Patient advised to avoid sick contacts or exposures - wash hands regularly emphasized, patient advised to come back if her symptoms do not improve    MDM  Acute bronchitis         Dorothea Ogle, MD 06/15/12 1111

## 2012-07-13 ENCOUNTER — Other Ambulatory Visit (INDEPENDENT_AMBULATORY_CARE_PROVIDER_SITE_OTHER): Payer: No Typology Code available for payment source

## 2012-07-13 ENCOUNTER — Ambulatory Visit (INDEPENDENT_AMBULATORY_CARE_PROVIDER_SITE_OTHER): Payer: No Typology Code available for payment source | Admitting: Physician Assistant

## 2012-07-13 ENCOUNTER — Encounter: Payer: Self-pay | Admitting: Physician Assistant

## 2012-07-13 ENCOUNTER — Ambulatory Visit (INDEPENDENT_AMBULATORY_CARE_PROVIDER_SITE_OTHER)
Admission: RE | Admit: 2012-07-13 | Discharge: 2012-07-13 | Disposition: A | Discharge status: Home / Self Care | Payer: No Typology Code available for payment source | Source: Ambulatory Visit | Attending: Physician Assistant | Admitting: Physician Assistant

## 2012-07-13 VITALS — BP 130/98 | HR 51 | Ht 66.0 in | Wt 183.0 lb

## 2012-07-13 DIAGNOSIS — R079 Chest pain, unspecified: Secondary | ICD-10-CM

## 2012-07-13 DIAGNOSIS — I079 Rheumatic tricuspid valve disease, unspecified: Secondary | ICD-10-CM

## 2012-07-13 DIAGNOSIS — I10 Essential (primary) hypertension: Secondary | ICD-10-CM

## 2012-07-13 DIAGNOSIS — R0989 Other specified symptoms and signs involving the circulatory and respiratory systems: Secondary | ICD-10-CM

## 2012-07-13 DIAGNOSIS — R05 Cough: Secondary | ICD-10-CM

## 2012-07-13 DIAGNOSIS — R0600 Dyspnea, unspecified: Secondary | ICD-10-CM

## 2012-07-13 DIAGNOSIS — I2510 Atherosclerotic heart disease of native coronary artery without angina pectoris: Secondary | ICD-10-CM

## 2012-07-13 DIAGNOSIS — F17200 Nicotine dependence, unspecified, uncomplicated: Secondary | ICD-10-CM

## 2012-07-13 DIAGNOSIS — I5032 Chronic diastolic (congestive) heart failure: Secondary | ICD-10-CM

## 2012-07-13 LAB — BASIC METABOLIC PANEL
BUN: 18 mg/dL (ref 6–23)
Calcium: 9.1 mg/dL (ref 8.4–10.5)
GFR: 70.07 mL/min (ref >60.00)
Glucose, Bld: 87 mg/dL (ref 70–99)
Potassium: 4.3 mEq/L (ref 3.5–5.1)

## 2012-07-13 LAB — BRAIN NATRIURETIC PEPTIDE: Pro B Natriuretic peptide (BNP): 190 pg/mL — ABNORMAL HIGH (ref 0.0–100.0)

## 2012-07-13 MED ORDER — LISINOPRIL 20 MG PO TABS: 20.0000 mg | ORAL_TABLET | Freq: Every day | ORAL | Status: DC

## 2012-07-13 MED ORDER — CLONIDINE HCL 0.3 MG PO TABS: 0.3000 mg | ORAL_TABLET | ORAL | Status: DC

## 2012-07-13 MED ORDER — AMLODIPINE BESYLATE 10 MG PO TABS: 10.0000 mg | ORAL_TABLET | Freq: Every day | ORAL | Status: DC

## 2012-07-13 MED ORDER — POTASSIUM CHLORIDE CRYS ER 10 MEQ PO TBCR: 10.0000 meq | EXTENDED_RELEASE_TABLET | Freq: Two times a day (BID) | ORAL | Status: DC

## 2012-07-13 MED ORDER — FUROSEMIDE 20 MG PO TABS: 20.0000 mg | ORAL_TABLET | Freq: Two times a day (BID) | ORAL | Status: DC

## 2012-07-13 NOTE — Progress Notes (Signed)
[address] [place], [place]  [address] Phone: [phone] Fax:  [phone]  Date:  07/13/2012   ID:  Morgan Bean, DOB January 28, 1966, MRN [id number]  PCP:  Morgan Ancona, MD  Primary Cardiologist:  Dr. Marca Bean     History of Present Illness: Morgan Bean is a 47 y.o. female who returns for f/u.  She has a hx of HTN and abnormal echo in 9/11 with moderate pulmonary hypertension and moderate to possibly severe tricuspid regurgitation. 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 She had an echo done at that time with the aforementioned findings. Patient has had poorly controlled hypertension. Patient had reported episodes of exertional chest pain. She also had had quite significant exertional dyspnea for over a year. A Lexiscan myoview showed ischemic-type ST depression with infusion of Lexiscan but no ischemia or infarction on perfusion images.  She was started on Lasix and left and right heart cath was done. There was minimal CAD, and filling pressures were normal. After starting Lasix, her dyspnea seemed to have totally resolved. It was thought that her dyspnea was related to diastolic dysfunction in the setting of uncontrolled HTN.    Last echo 07/2011:  Mild LVH, EF 60-65%, mild AI, mild MR, mild LAE, mild to mod TR, PASP 55, mod pulmo HTN.    Patient was admitted in 1/14 with chest pain the setting of acute bronchitis. She ruled out for myocardial infarction by enzymes. Chest x-ray was negative. She was treated with antibiotics. No further cardiac workup was pursued.  She was seen again in the urgent care with continued cough. She was placed on amoxicillin. She has had no improvement in her cough. This has been going on for months.  She notes dyspnea with exertion. She describes NYHA class III symptoms. She sleeps on 3 pillows. She awakens with cough at night. She denies pedal edema. She does note wheezing.  She  denies fevers. She denies hemoptysis.  Her sputum is light yellow.  It is not copious.  She has also noted chest pain.  This started in January and has not changed much.  She notes it mainly after exertion.  It is dull and left sided.  No syncope.    Labs (9/12):    HCT 31.5, BNP 132, K 4, creatinine 1.2  Labs (10/12):  LDL 142, HDL 46, K 4.2, creatinine 1.3  Labs (1/14):    K 3.9, Cr 1.04, Hgb 11.1, TSH 2.063  Wt Readings from Last 3 Encounters:  07/13/12 183 lb (83.008 kg)  06/13/12 170 lb (77.111 kg)  04/07/12 164 lb 10.9 oz (74.7 kg)    Past Medical History:  1. Fe-deficiency anemia  2. HTN  3. Asthma  4. Active smoker  5. Prior cocaine  6. 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 ANA positive. Thought to be secondary to cocaine use.  8. Pulmonary hypertension/tricuspid regurgitation: Echo (9/11) with EF 65%, mild LVH, mild AI, mild MR, moderate to possibly severe TR with PA systolic pressure 52 mmHg. Echo (10/12): severe LV hypertrophy, EF 60-65%, mild MR, mild AI, moderate to severe tricuspid regurgitation, PA systolic pressure 38 mmHg.   Echo 07/2011:  Mild LVH, EF 60-65%, mild AI, mild MR, mild LAE, mild to mod TR, PASP 55, mod pulmo HTN.  9. Chest pain: Lexiscan myoview (10/12) with significant ST depression upon Lexiscan injection but no ischemia  or infarction on perfusion images. Left heart cath (10/12): 30% mid LAD, 30% ostial D1, 50% ostial D2.  10. Diastolic CHF    Current Outpatient Prescriptions  Medication Sig Dispense Refill  . albuterol (PROVENTIL HFA;VENTOLIN HFA) 108 (90 BASE) MCG/ACT inhaler Inhale 2 puffs into the lungs every 4 (four) hours as needed for wheezing or shortness of breath.  6.7 g  0  . amLODipine (NORVASC) 5 MG tablet Take 1 tablet (5 mg total) by mouth daily at 8 pm. For blood pressure.  30 tablet  11  . aspirin 81 MG chewable tablet Chew 1 tablet (81 mg total) by mouth daily.  10  tablet  0  . citalopram (CELEXA) 40 MG tablet Take 1 tablet (40 mg total) by mouth daily. For depression and anxiety.  30 tablet  0  . cloNIDine (CATAPRES) 0.3 MG tablet Take 1 tablet (0.3 mg total) by mouth 2 (two) times daily in the am and at bedtime.. For blood pressure control.  60 tablet  0  . ferrous sulfate 325 (65 FE) MG tablet Take 1 tablet (325 mg total) by mouth 2 (two) times daily with a meal. For anemia.  60 tablet  0  . furosemide (LASIX) 20 MG tablet Take 1 tablet (20 mg total) by mouth 2 (two) times daily.  10 tablet  0  . lisinopril (PRINIVIL,ZESTRIL) 20 MG tablet Take 1 tablet (20 mg total) by mouth daily at 8 pm. For blood pressure.  30 tablet  0  . loratadine (CLARITIN) 10 MG tablet Take 1 tablet (10 mg total) by mouth daily. For seasonal allergies.  30 tablet  0  . Multiple Vitamin (MULTIVITAMIN WITH MINERALS) TABS Take 1 tablet by mouth daily. For nutritional supplementation.  30 tablet  0  . polyethylene glycol powder (GLYCOLAX/MIRALAX) powder Take 17 g by mouth daily as needed. Constipation      . potassium chloride (K-DUR,KLOR-CON) 10 MEQ tablet Take 1 tablet (10 mEq total) by mouth 2 (two) times daily with a meal.  60 tablet  0  . traZODone (DESYREL) 100 MG tablet Take 1 tablet (100 mg total) by mouth at bedtime. For sleep.  30 tablet  1   No current facility-administered medications for this visit.    Allergies:    Allergies  Allergen Reactions  . Ciprofloxacin Itching    Social History:  The patient  reports that she has been smoking Cigarettes.  She has a 10 pack-year smoking history. She has never used smokeless tobacco. She reports that  drinks alcohol. She reports that she uses illicit drugs ("Crack" cocaine) about once per week.   ROS:  Please see the history of present illness.    All other systems reviewed and negative.   PHYSICAL EXAM: VS:  BP 130/98  Pulse 51  Ht 5\' 6"  (1.676 m)  Wt 183 lb (83.008 kg)  BMI 29.55 kg/m2  LMP 06/12/2012 Well  nourished, well developed, in no acute distress HEENT: normal Neck: minimally elevated JVD Cardiac:  normal S1, S2; RRR; no murmur Lungs:  decreased breath sounds bilaterally, no wheezing, rhonchi or rales Abd: soft, nontender, no hepatomegaly Ext: no edema Skin: warm and dry Neuro:  CNs 2-12 intact, no focal abnormalities noted  EKG:  Sinus bradycardia, HR 51, normal axis, no acute changes     ASSESSMENT AND PLAN:  1. Dyspnea:  I suspect her dyspnea and cough for more than likely related to underlying COPD. I do not think that she has had a formal  evaluation for COPD. She continues to smoke. She has been out of her Lasix for about 15 days. Her weight is up quite significantly. However, she does not appear to be that volume overloaded on exam. Her neck veins are visible but she does have significant TR. I will obtain a chest x-ray, PFTs with DLCO, basic metabolic panel and BNP. She will restart her Lasix. If her BNP is significantly elevated, however take a higher dose of Lasix for several days. I will also refer her to pulmonology. 2. Cough:  Obtain chest x-ray as noted. I will also obtain a CBC. If she has an infiltrate and elevated white count, consider doxycycline. 3. Chest Pain: Somewhat atypical. She had mild nonobstructive disease on cardiac catheterization less than 2 years ago. I suspect that her chest pain is likely related to underlying lung disease as well as possibly diastolic CHF. I do not believe that she needs further ischemic evaluation at this time. 4. Diastolic CHF: Obtain chest x-ray and BNP as noted. Adjust Lasix as necessary. 5. CAD: Continue aspirin. No further ischemic testing needed. 6. Hypertension:  Increase amlodipine to 10 mg daily. 7. Tricuspid Regurgitation:  TR and pulmonary hypertension was fairly stable by last echo. With her recent symptoms, I will have her undergo a follow up echocardiogram. 8. Tobacco Abuse:  She has been advised to quit.  9. Disposition:  Follow up with Dr. Shirlee Latch in 1 month.  Signed, Tereso Newcomer, PA-C  1:08 PM 07/13/2012

## 2012-07-13 NOTE — Patient Instructions (Addendum)
LABS AND CXR TODAY AT [facility] ON [address] FROM [facility]; YOU WILL TAKE ELEVATOR TO BASEMENT  INCREASE NORVASC TO 10 MG DAILY  Your physician has requested that you have an echocardiogram. Echocardiography is a painless test that uses sound waves to create images of your heart. It provides your doctor with information about the size and shape of your heart and how well your heart's chambers and valves are working. This procedure takes approximately one hour. There are no restrictions for this procedure.  You have been referred to [facility] PULMOARY DX SOB, COUGH  Your physician has recommended that you have a pulmonary function test. Pulmonary Function Tests are a group of tests that measure how well air moves in and out of your lungs.  PLEASE FOLLOW UP WITH DR. Shirlee Latch IN 1 MONTH  YOU HAVE BEEN GIVEN RX'S FOR REFILLS ON NORVASC, CATAPRES, LISINOPRIL, LASIX, POTASSIUM

## 2012-07-14 ENCOUNTER — Telehealth: Payer: Self-pay | Admitting: *Deleted

## 2012-07-14 LAB — CBC WITH DIFFERENTIAL/PLATELET
Basophils Absolute: 0 10*3/uL (ref 0.0–0.1)
HCT: 38.3 % (ref 36.0–46.0)
Lymphs Abs: 2.8 10*3/uL (ref 0.7–4.0)
Monocytes Absolute: 0.8 10*3/uL (ref 0.1–1.0)
Monocytes Relative: 19 % — ABNORMAL HIGH (ref 3.0–12.0)
Platelets: 193 10*3/uL (ref 150.0–400.0)
RDW: 15.2 % — ABNORMAL HIGH (ref 11.5–14.6)

## 2012-07-14 NOTE — Telephone Encounter (Signed)
pt notified about cxr with verbal understanding today

## 2012-07-14 NOTE — Telephone Encounter (Signed)
Message copied by Tarri Fuller on Thu Jul 14, 2012 10:50 AM ------      Message from: [place], [place] T      Created: Wed Jul 13, 2012  5:19 PM       chest X-ray ok       No sign of pneumonia or CHF      Tereso Newcomer, PA-C  5:19 PM 07/13/2012 ------

## 2012-07-19 ENCOUNTER — Ambulatory Visit (HOSPITAL_COMMUNITY): Payer: No Typology Code available for payment source | Attending: Cardiology | Admitting: Radiology

## 2012-07-19 DIAGNOSIS — F17200 Nicotine dependence, unspecified, uncomplicated: Secondary | ICD-10-CM | POA: Insufficient documentation

## 2012-07-19 DIAGNOSIS — I509 Heart failure, unspecified: Secondary | ICD-10-CM | POA: Insufficient documentation

## 2012-07-19 DIAGNOSIS — R0602 Shortness of breath: Secondary | ICD-10-CM | POA: Insufficient documentation

## 2012-07-19 DIAGNOSIS — R0609 Other forms of dyspnea: Secondary | ICD-10-CM

## 2012-07-19 DIAGNOSIS — I10 Essential (primary) hypertension: Secondary | ICD-10-CM | POA: Insufficient documentation

## 2012-07-19 DIAGNOSIS — I5032 Chronic diastolic (congestive) heart failure: Secondary | ICD-10-CM

## 2012-07-19 DIAGNOSIS — F1910 Other psychoactive substance abuse, uncomplicated: Secondary | ICD-10-CM | POA: Insufficient documentation

## 2012-07-19 DIAGNOSIS — R05 Cough: Secondary | ICD-10-CM

## 2012-07-19 DIAGNOSIS — E785 Hyperlipidemia, unspecified: Secondary | ICD-10-CM | POA: Insufficient documentation

## 2012-07-19 DIAGNOSIS — R0600 Dyspnea, unspecified: Secondary | ICD-10-CM

## 2012-07-19 DIAGNOSIS — I2510 Atherosclerotic heart disease of native coronary artery without angina pectoris: Secondary | ICD-10-CM | POA: Insufficient documentation

## 2012-07-19 NOTE — Progress Notes (Signed)
Echocardiogram performed.  

## 2012-07-20 ENCOUNTER — Telehealth: Payer: Self-pay | Admitting: *Deleted

## 2012-07-20 ENCOUNTER — Encounter: Payer: Self-pay | Admitting: Physician Assistant

## 2012-07-20 NOTE — Telephone Encounter (Signed)
pt notified about echo results with verbal understanding 

## 2012-07-20 NOTE — Telephone Encounter (Signed)
Message copied by Tarri Fuller on Wed Jul 20, 2012  3:55 PM ------      Message from: [place], [place] T      Created: Wed Jul 20, 2012  2:01 PM       Normal LVF.      Continue with current treatment plan.      Tereso Newcomer, PA-C  2:01 PM 07/20/2012 ------

## 2012-08-02 ENCOUNTER — Encounter: Payer: Self-pay | Admitting: *Deleted

## 2012-08-09 ENCOUNTER — Ambulatory Visit: Payer: No Typology Code available for payment source | Attending: Internal Medicine | Admitting: Internal Medicine

## 2012-08-09 VITALS — BP 110/84 | HR 64 | Temp 98.4°F | Resp 14 | Ht 66.0 in | Wt 179.0 lb

## 2012-08-09 DIAGNOSIS — I5032 Chronic diastolic (congestive) heart failure: Secondary | ICD-10-CM | POA: Insufficient documentation

## 2012-08-09 DIAGNOSIS — I6350 Cerebral infarction due to unspecified occlusion or stenosis of unspecified cerebral artery: Secondary | ICD-10-CM

## 2012-08-09 DIAGNOSIS — Z8673 Personal history of transient ischemic attack (TIA), and cerebral infarction without residual deficits: Secondary | ICD-10-CM | POA: Insufficient documentation

## 2012-08-09 DIAGNOSIS — F1410 Cocaine abuse, uncomplicated: Secondary | ICD-10-CM

## 2012-08-09 DIAGNOSIS — I2789 Other specified pulmonary heart diseases: Secondary | ICD-10-CM | POA: Insufficient documentation

## 2012-08-09 DIAGNOSIS — I2510 Atherosclerotic heart disease of native coronary artery without angina pectoris: Secondary | ICD-10-CM

## 2012-08-09 DIAGNOSIS — I10 Essential (primary) hypertension: Secondary | ICD-10-CM | POA: Insufficient documentation

## 2012-08-09 DIAGNOSIS — I639 Cerebral infarction, unspecified: Secondary | ICD-10-CM

## 2012-08-09 DIAGNOSIS — F17200 Nicotine dependence, unspecified, uncomplicated: Secondary | ICD-10-CM | POA: Insufficient documentation

## 2012-08-09 MED ORDER — FERROUS SULFATE 325 (65 FE) MG PO TABS: 325.0000 mg | ORAL_TABLET | Freq: Two times a day (BID) | ORAL | Status: DC

## 2012-08-09 MED ORDER — LORATADINE 10 MG PO TABS: 10.0000 mg | ORAL_TABLET | Freq: Every day | ORAL | Status: DC

## 2012-08-09 NOTE — Patient Instructions (Signed)
Heart Failure  Heart failure (HF) is a condition in which the heart has trouble pumping blood. This means your heart does not pump blood efficiently for your body to work well. In some cases of HF, fluid may back up into your lungs or you may have swelling (edema) in your lower legs. HF is a long-term (chronic) condition. It is important for you to take good care of yourself and follow your caregiver's treatment plan.  CAUSES   · Health conditions:  · High blood pressure (hypertension) causes the heart muscle to work harder than normal. When pressure in the blood vessels is high, the heart needs to pump (contract) with more force in order to circulate blood throughout the body. High blood pressure eventually causes the heart to become stiff and weak.  · Coronary artery disease (CAD) is the buildup of cholesterol and fat (plaques) in the arteries of the heart. The blockage in the arteries deprives the heart muscle of oxygen and blood. This can cause chest pain and may lead to a heart attack. High blood pressure can also contribute to CAD.  · Heart attack (myocardial infarction) occurs when 1 or more arteries in the heart become blocked. The loss of oxygen damages the muscle tissue of the heart. When this happens, part of the heart muscle dies. The injured tissue does not contract as well and weakens the heart's ability to pump blood.  · Abnormal heart valves can cause HF when the heart valves do not open and close properly. This makes the heart muscle pump harder to keep the blood flowing.  · Heart muscle disease (cardiomyopathy or myocarditis) is damage to the heart muscle from a variety of causes. These can include drug or alcohol abuse, infections, or unknown reasons. These can increase the risk of HF.  · Lung disease makes the heart work harder because the lungs do not work properly. This can cause a strain on the heart leading it to fail.  · Diabetes increases the risk of HF. High blood sugar contributes to high  fat (lipid) levels in the blood. Diabetes can also cause slow damage to tiny blood vessels that carry important nutrients to the heart muscle. When the heart does not get enough oxygen and food, it can cause the heart to become weak and stiff. This leads to a heart that does not contract efficiently.  · Other diseases can contribute to HF. These include abnormal heart rhythms, thyroid problems, and low blood counts (anemia).  · Unhealthy lifestyle habits:  · Obesity.  · Smoking.  · Eating foods high in fat and cholesterol.  · Eating or drinking beverages high in salt.  · Drug or alcohol abuse.  · Lack of exercise.  SYMPTOMS   HF symptoms may vary and can be hard to detect. Symptoms may include:  · Shortness of breath with activity, such as climbing stairs.  · Persistent cough.  · Swelling of the feet, ankles, legs, or abdomen.  · Unexplained weight gain.  · Difficulty breathing when lying flat.  · Waking from sleep because of the need to sit up and get more air.  · Rapid heartbeat.  · Fatigue and loss of energy.  · Feeling lightheaded or close to fainting.  DIAGNOSIS   A diagnosis of HF is based on your history, symptoms, physical examination, and diagnostic tests.  Diagnostic tests for HF may include:  · EKG.  · Chest X-ray.  · Blood tests.  · Exercise stress test.  · Blood   oxygen test (arterial blood gas).  · Evaluation by a heart doctor (cardiologist).  · Ultrasound evaluation of the heart (echocardiogram).  · Heart artery test to look for blockages (angiogram).  · Radioactive imaging to look at the heart (radionuclide test).  TREATMENT   Treatment is aimed at managing the symptoms of HF. Medicines, lifestyle changes, or surgical intervention may be necessary to treat HF.  · Medicines to help treat HF may include:  · Angiotensin-converting enzyme (ACE) inhibitors. These block the effects of a blood protein called angiotensin-converting enzyme. ACE inhibitors relax (dilate) the blood vessels and help lower blood  pressure. This decreases the workload of the heart, slows the progression of HF, and improves symptoms.  · Angiotensin receptor blockers (ARBs). These medications work similar to ACE inhibitors. ARBs may be an alternative for people who cannot tolerate an ACE inhibitor.  · Aldosterone antagonists. This medication helps get rid of extra fluid from your body. This lowers the volume of blood the heart has to pump.  · Water pills (diuretics). Diuretics cause the kidneys to remove salt and water from the blood. The extra fluid is removed by urination. By removing extra fluid from the body, diuretics help lower the workload of the heart and help prevent fluid buildup in the lungs so breathing is easier.  · Beta blockers. These prevent the heart from beating too fast and improve heart muscle strength. Beta blockers help maintain a normal heart rate, control blood pressure, and improve HF symptoms.  · Digitalis. This increases the force of the heartbeat and may be helpful to people with HF or heart rhythm problems.  · Healthy lifestyle changes include:  · Stopping smoking.  · Eating a healthy diet. Avoid foods high in fat. Avoid foods fried in oil or made with fat. A dietician can help with healthy food choices.  · Limiting how much salt you eat.  · Limiting alcohol intake to no more than 1 drink per day for women and 2 drinks per day for men. Drinking more than that is harmful to your heart. If your heart has already been damaged by alcohol or you have severe HF, drinking alcohol should be stopped completely.  · Exercising as directed by your caregiver.  · Surgical treatment for HF may include:  · Procedures to open blocked arteries, repair damaged heart valves, or remove damaged heart muscle tissue.  · A pacemaker to help heart muscle function and to control certain abnormal heart rhythms.  · A defibrillator to possibly prevent sudden cardiac death.  HOME CARE INSTRUCTIONS   · Activity level. Your caregiver can help you  determine what type of exercise program may be helpful. It is important to maintain your strength. Pace your physical activity to avoid shortness of breath or chest pain. Rest for 1 hour before and after meals. A cardiac rehabilitation program may be helpful to some people with HF.  · Diet. Eat a heart healthy diet. Food choices should be low in saturated fat and cholesterol. Talk to a dietician to learn about heart healthy foods.  · Salt intake. When you have HF, you need to limit the amount of salt you eat. Eat less than 1500 milligrams (mg) of salt per day or as recommended by your caregiver.  · Weight monitoring. Weigh yourself every day. You should weigh yourself in the morning after you urinate and before you eat breakfast. Wear the same amount of clothing each time you weigh yourself. Record your weight daily. Bring your recorded   weights to your clinic visits. Tell your caregiver right away if you have gained 3 lb/1.4 kg in 1 day, or 5 lb/2.3 kg in a week or whatever amount you were told to report.  · Blood pressure monitoring. This should be done as directed by your caregiver. A home blood pressure cuff can be purchased at a drugstore. Record your blood pressure numbers and bring them to your clinic visits. Tell your caregiver if you become dizzy or lightheaded upon standing up.  · Smoking. If you are currently a smoker, it is time to quit. Nicotine makes your heart work harder by causing your blood vessels to constrict. Do not use nicotine gum or patches before talking to your caregiver.  · Follow up. Be sure to schedule a follow-up visit with your caregiver. Keep all your appointments.  SEEK MEDICAL CARE IF:   · Your weight increases by 3 lb/1.4 kg in 1 day or 5 lb/2.3 kg in a week.  · You notice increasing shortness of breath that is unusual for you. This may happen during rest, sleep, or with activity.  · You cough more than normal, especially with physical activity.  · You notice more swelling in your  hands, feet, ankles, or belly (abdomen).  · You are unable to sleep because it is hard to breathe.  · You cough up bloody mucus (sputum).  · You begin to feel "jumping" or "fluttering" sensations (palpitations) in your chest.  SEEK IMMEDIATE MEDICAL CARE IF:   · You have severe chest pain or pressure which may include symptoms such as:  · Pain or pressure in the arms, neck, jaw, or back.  · Feeling sweaty.  · Feeling sick to your stomach (nauseous).  · Feeling short of breath while at rest.  · Having a fast or irregular heartbeat.  · You experience stroke symptoms. These symptoms include:  · Facial weakness or numbness.  · Weakness or numbness in an arm, leg, or on one side of your body.  · Blurred vision.  · Difficulty talking or thinking.  · Dizziness or fainting.  · Severe headache.  MAKE SURE YOU:   · Understand these instructions.  · Will watch your condition.  · Will get help right away if you are not doing well or get worse.  Document Released: 03/09/2005 Document Revised: 09/08/2011 Document Reviewed: 06/21/2009  [employer]® Patient Information ©2013 [employer].

## 2012-08-09 NOTE — Progress Notes (Signed)
Patient ID: Morgan Bean, female   DOB: 10/09/1965, 47 y.o.   MRN: [id number] Patient Demographics  Morgan Bean, is a 47 y.o. female  WUJ:[id number]  [id number]  DOB - 1965/11/02  Chief Complaint  Patient presents with  . Establish Care    Patient presents to establish care. Patient states she also needs refills on her medication.        Subjective:   Morgan Bean today is here to establish primary care. Patient has a Past medical history of the following Past Medical History  Diagnosis Date  . Coronary artery disease     Lexiscan myoview (10/12) with significant ST depression upon Lexiscan injection but no ischemia or infarction on perfusion images. Left heart cath (10/12): 30% mid LAD, 30% ostial D1, 50% ostial D2.    . Stroke   . Hypertension   . Tricuspid regurgitation   . Iron deficiency     hx of  . Polysubstance abuse   . Tobacco abuse   . Leukocytoclastic vasculitis   . Pulmonary HTN     Echo (9/11) with EF 65%, mild LVH, mild AI, mild MR, moderate to possibly severe TR with PA systolic pressure 52 mmHg. Echo (10/12): severe LV hypertrophy, EF 60-65%, mild MR, mild AI, moderate to severe tricuspid regurgitation, PA systolic pressure 38 mmHg.  Echo 4/14: moderate LVH, EF 65%, normal wall motion, diastolic dysfunction, mild AI, mild MR  . Asthma   . CHF (congestive heart failure)   . Currently patient has no complaints.She needs refill on her Claritin and Fe tablets Patient has also has No headache, No chest pain, No abdominal pain,No Nausea, No new weakness tingling or numbness, No Cough or SOB.   Objective:    Filed Vitals:   08/09/12 0931  BP: 110/84  Pulse: 64  Temp: 98.4 F (36.9 C)  TempSrc: Oral  Resp: 14  Height: 5\' 6"  (1.676 m)  Weight: 179 lb (81.194 kg)  SpO2: 100%     ALLERGIES:   Allergies  Allergen Reactions  . Ciprofloxacin Itching    PAST MEDICAL HISTORY: Past Medical History  Diagnosis Date  . Coronary artery disease    Lexiscan myoview (10/12) with significant ST depression upon Lexiscan injection but no ischemia or infarction on perfusion images. Left heart cath (10/12): 30% mid LAD, 30% ostial D1, 50% ostial D2.    . Stroke   . Hypertension   . Tricuspid regurgitation   . Iron deficiency     hx of  . Polysubstance abuse   . Tobacco abuse   . Leukocytoclastic vasculitis   . Pulmonary HTN     Echo (9/11) with EF 65%, mild LVH, mild AI, mild MR, moderate to possibly severe TR with PA systolic pressure 52 mmHg. Echo (10/12): severe LV hypertrophy, EF 60-65%, mild MR, mild AI, moderate to severe tricuspid regurgitation, PA systolic pressure 38 mmHg.  Echo 4/14: moderate LVH, EF 65%, normal wall motion, diastolic dysfunction, mild AI, mild MR  . Asthma   . CHF (congestive heart failure)     PAST SURGICAL HISTORY: Past Surgical History  Procedure Laterality Date  . Ankle surgery      right  . Cardiac catheterization    . I&d extremity  08/09/2011    Procedure: IRRIGATION AND DEBRIDEMENT EXTREMITY;  Surgeon: Robyne Askew, MD;  Location: [facility] OR;  Service: General;  Laterality: Left;  i & D Left axilla abscess    FAMILY HISTORY: Family History  Problem Relation Age of Onset  .  Coronary artery disease Mother   . Diabetes Mother   . Breast cancer Maternal Grandmother   . Breast cancer Mother     MEDICATIONS AT HOME: Prior to Admission medications   Medication Sig Start Date End Date Taking? Authorizing Provider  albuterol (PROVENTIL HFA;VENTOLIN HFA) 108 (90 BASE) MCG/ACT inhaler Inhale 2 puffs into the lungs every 4 (four) hours as needed for wheezing or shortness of breath. 06/15/12 06/15/13 Yes Dorothea Ogle, MD  amLODipine (NORVASC) 10 MG tablet Take 1 tablet (10 mg total) by mouth daily at 8 pm. For blood pressure. 07/13/12 07/13/13 Yes Scott Moishe Spice, PA-C  aspirin 81 MG chewable tablet Chew 1 tablet (81 mg total) by mouth daily. 06/15/12  Yes Dorothea Ogle, MD  citalopram (CELEXA) 40 MG tablet Take  1 tablet (40 mg total) by mouth daily. For depression and anxiety. 06/15/12  Yes Dorothea Ogle, MD  cloNIDine (CATAPRES) 0.3 MG tablet Take 1 tablet (0.3 mg total) by mouth 2 (two) times daily in the am and at bedtime.. For blood pressure control. 07/13/12 07/13/13 Yes Scott T Alben Spittle, PA-C  ferrous sulfate 325 (65 FE) MG tablet Take 1 tablet (325 mg total) by mouth 2 (two) times daily with a meal. For anemia. 08/09/12 08/09/13 Yes Shanker Levora Dredge, MD  furosemide (LASIX) 20 MG tablet Take 1 tablet (20 mg total) by mouth 2 (two) times daily. 07/13/12  Yes Scott T Alben Spittle, PA-C  lisinopril (PRINIVIL,ZESTRIL) 20 MG tablet Take 1 tablet (20 mg total) by mouth daily at 8 pm. For blood pressure. 07/13/12 07/13/13 Yes Scott T Weaver, PA-C  loratadine (CLARITIN) 10 MG tablet Take 1 tablet (10 mg total) by mouth daily. For seasonal allergies. 08/09/12  Yes Shanker Levora Dredge, MD  Multiple Vitamin (MULTIVITAMIN WITH MINERALS) TABS Take 1 tablet by mouth daily. For nutritional supplementation. 11/18/11  Yes Curlene Labrum Readling, MD  polyethylene glycol powder (GLYCOLAX/MIRALAX) powder Take 17 g by mouth daily as needed. Constipation   Yes Historical Provider, MD  potassium chloride (K-DUR,KLOR-CON) 10 MEQ tablet Take 1 tablet (10 mEq total) by mouth 2 (two) times daily with a meal. 07/13/12  Yes Scott T Alben Spittle, PA-C  traZODone (DESYREL) 100 MG tablet Take 1 tablet (100 mg total) by mouth at bedtime. For sleep. 06/15/12  Yes Dorothea Ogle, MD    SOCIAL HISTORY:   reports that she has been smoking Cigarettes.  She has a 10 pack-year smoking history. She has never used smokeless tobacco. She reports that  drinks alcohol. She reports that she uses illicit drugs ("Crack" cocaine) about once per week.  REVIEW OF SYSTEMS:  Constitutional:   No   Fevers, chills, fatigue.  HEENT:    No headaches, Sore throat,   Cardio-vascular: No chest pain,  Orthopnea, swelling in lower extremities, anasarca, palpitations  GI:  No abdominal  pain, nausea, vomiting, diarrhea  Resp: No shortness of breath,  No coughing up of blood.No cough.No wheezing.  Skin:  no rash or lesions.  GU:  no dysuria, change in color of urine, no urgency or frequency.  No flank pain.  Musculoskeletal: No joint pain or swelling.  No decreased range of motion.  No back pain.  Psych: No change in mood or affect. No depression or anxiety.  No memory loss.   Exam  General appearance :Awake, alert, not in any distress. Speech Clear. Not toxic Looking HEENT: Atraumatic and Normocephalic, pupils equally reactive to light and accomodation Neck: supple, no JVD. No cervical lymphadenopathy.  Chest:Good air entry bilaterally, no added sounds  CVS: S1 S2 regular, no murmurs.  Abdomen: Bowel sounds present, Non tender and not distended with no gaurding, rigidity or rebound. Extremities: B/L Lower Ext shows no edema, both legs are warm to touch Neurology: Awake alert, and oriented X 3, CN II-XII intact, Non focal Skin:No Rash Wounds:N/A    Data Review   CBC No results found for this basename: WBC, HGB, HCT, PLT, MCV, MCH, MCHC, RDW, NEUTRABS, LYMPHSABS, MONOABS, EOSABS, BASOSABS, BANDABS, BANDSABD,  in the last 168 hours  Chemistries   No results found for this basename: NA, K, CL, CO2, GLUCOSE, BUN, CREATININE, GFRCGP, CALCIUM, MG, AST, ALT, ALKPHOS, BILITOT,  in the last 168 hours ------------------------------------------------------------------------------------------------------------------ No results found for this basename: HGBA1C,  in the last 72 hours ------------------------------------------------------------------------------------------------------------------ No results found for this basename: CHOL, HDL, LDLCALC, TRIG, CHOLHDL, LDLDIRECT,  in the last 72 hours ------------------------------------------------------------------------------------------------------------------ No results found for this basename: TSH, T4TOTAL, FREET3,  T3FREE, THYROIDAB,  in the last 72 hours ------------------------------------------------------------------------------------------------------------------ No results found for this basename: VITAMINB12, FOLATE, FERRITIN, TIBC, IRON, RETICCTPCT,  in the last 72 hours  Coagulation profile  No results found for this basename: INR, PROTIME,  in the last 168 hours    Assessment & Plan   Chronic Diastolic Heart Failure -compensated clinically -continue with Lasix, c/w lisinopril -follows with [place] cards as well  Pul HTN -has appointment with Pul on 5/23 for PFT's and an appt with Dr Shelle Iron as well -will await w/u  HTN -currently controlled with Amlodipine and Lisinopril  Hx of [place] CVA -no major residual deficits -c/w ASA  Tobacco Use -claims she is cutting down -counselled extensively regarding importance of quiting  Prior h/o crack cocaine use -claims last use was "months back" -have re-emphasized need to continue to not use such agents-she already has had a CVA and has Pul HTN as well -she understands  Depression -stable with Celexa, and Trazodone  B/L Foot corns/callouses -refer to podiatry  Gen Health Maintenance -Mammography in 2012 -Pap Smear 2012-[facility]  Follow up in 3 months  Patient has been told to call us prn for acute issues and to call us before she runs out of her meds.

## 2012-08-09 NOTE — Progress Notes (Signed)
Patient states that she is having trouble swallowing with fever/chills, pressure in ears and head congestion.

## 2012-08-10 ENCOUNTER — Encounter: Payer: Self-pay | Admitting: Cardiology

## 2012-08-10 ENCOUNTER — Ambulatory Visit (INDEPENDENT_AMBULATORY_CARE_PROVIDER_SITE_OTHER): Payer: No Typology Code available for payment source | Admitting: Cardiology

## 2012-08-10 VITALS — BP 116/80 | HR 67 | Ht 66.0 in | Wt 174.0 lb

## 2012-08-10 DIAGNOSIS — E785 Hyperlipidemia, unspecified: Secondary | ICD-10-CM

## 2012-08-10 DIAGNOSIS — I5032 Chronic diastolic (congestive) heart failure: Secondary | ICD-10-CM

## 2012-08-10 DIAGNOSIS — F17200 Nicotine dependence, unspecified, uncomplicated: Secondary | ICD-10-CM

## 2012-08-10 DIAGNOSIS — R079 Chest pain, unspecified: Secondary | ICD-10-CM

## 2012-08-10 DIAGNOSIS — I10 Essential (primary) hypertension: Secondary | ICD-10-CM

## 2012-08-10 MED ORDER — PRAVASTATIN SODIUM 40 MG PO TABS: 40.0000 mg | ORAL_TABLET | Freq: Every evening | ORAL | Status: DC

## 2012-08-10 NOTE — Patient Instructions (Addendum)
Start pravastatin 40mg  daily in the evening.   Your physician recommends that you return for a FASTING lipid profile /liver profile in 2 months.   Your physician recommends that you schedule a follow-up appointment in: 6 months with Azucena Kuba  Your physician wants you to follow-up in: 1 year with Dr Shirlee Latch.(May 2015).You will receive a reminder letter in the mail two months in advance. If you don't receive a letter, please call our office to schedule the follow-up appointment.

## 2012-08-11 NOTE — Progress Notes (Signed)
Patient ID: Morgan Bean, female   DOB: May 09, 1965, 47 y.o.   MRN: [id number] PCP: Dr. Jerral Ralph  47 yo with history of diastolic CHF, tricuspid regurgitation, and nonobstructive CAD presents for followup.  She continues to have exertional dyspnea: she is short of breath after walking 1/2-1 block or walking up a flight of steps.  She has been taking her Lasix and weight has been relatively stable.  Last echo showed normal EF, moderate LVH, and only mild TR (was severe in the past).  BP has been under control.  She still has occasional atypical chest pain but nothing exertional.  She continues to smoke and is thought to have COPD.   Labs (9/12): HCT 31.5, BNP 132, K 4, creatinine 1.2 Labs (10/12): LDL 142, HDL 46, K 4.2, creatinine 1.3 Labs (4/14): K 4.3, creatinine 1.1, BNP 190, LDL 97, HDL 45  PMH: 1. Fe-deficiency anemia 2. HTN 3. Asthma 4. Active smoker 5. Prior cocaine 6. 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  ANA positive.  Thought to be secondary to cocaine use.   8. Pulmonary hypertension/tricuspid regurgitation: Echo (9/11) with EF 65%, mild LVH, mild AI, mild MR, moderate to possibly severe TR with PA systolic pressure 52 mmHg.  Echo (10/12): severe LV hypertrophy, EF 60-65%, mild MR, mild AI, moderate to severe tricuspid regurgitation, PA systolic pressure 38 mmHg.  RHC (10/12) with mean RA 4, PA 28/9, mean PCWP 9, CI 3.8.  Echo (4/14) with EF 65%, moderate LVH, mild AI, mild MR, mild TR.   9. Chest pain: Lexiscan myoview (10/12) with significant ST depression upon Lexiscan injection but no ischemia or infarction on perfusion images.  Left heart cath (10/12): 30% mid LAD, 30% ostial D1, 50% ostial D2.  10. Diastolic CHF  SH: Active smoker but has cut back, has 1 daughter, unemployed.  Prior cocaine abuse.    FH: Grandmother with "heart problems."  Father with 2 open heart surgeries in his late 8s.  Brother with PCI at  age 49.   ROS: All systems reviewed and negative except as per HPI.   Current Outpatient Prescriptions  Medication Sig Dispense Refill  . albuterol (PROVENTIL HFA;VENTOLIN HFA) 108 (90 BASE) MCG/ACT inhaler Inhale 2 puffs into the lungs every 4 (four) hours as needed for wheezing or shortness of breath.  6.7 g  0  . amLODipine (NORVASC) 10 MG tablet Take 1 tablet (10 mg total) by mouth daily at 8 pm. For blood pressure.  30 tablet  11  . aspirin 81 MG chewable tablet Chew 1 tablet (81 mg total) by mouth daily.  10 tablet  0  . citalopram (CELEXA) 40 MG tablet Take 1 tablet (40 mg total) by mouth daily. For depression and anxiety.  30 tablet  0  . cloNIDine (CATAPRES) 0.3 MG tablet Take 1 tablet (0.3 mg total) by mouth 2 (two) times daily in the am and at bedtime.. For blood pressure control.  60 tablet  11  . ferrous sulfate 325 (65 FE) MG tablet Take 1 tablet (325 mg total) by mouth 2 (two) times daily with a meal. For anemia.  60 tablet  3  . furosemide (LASIX) 20 MG tablet Take 1 tablet (20 mg total) by mouth 2 (two) times daily.  60 tablet  11  . lisinopril (PRINIVIL,ZESTRIL) 20 MG tablet Take 1 tablet (20 mg total) by mouth daily at 8 pm. For blood pressure.  30 tablet  11  .  loratadine (CLARITIN) 10 MG tablet Take 1 tablet (10 mg total) by mouth daily. For seasonal allergies.  30 tablet  3  . Multiple Vitamin (MULTIVITAMIN WITH MINERALS) TABS Take 1 tablet by mouth daily. For nutritional supplementation.  30 tablet  0  . polyethylene glycol powder (GLYCOLAX/MIRALAX) powder Take 17 g by mouth daily as needed. Constipation      . potassium chloride (K-DUR,KLOR-CON) 10 MEQ tablet Take 1 tablet (10 mEq total) by mouth 2 (two) times daily with a meal.  60 tablet  11  . traZODone (DESYREL) 100 MG tablet Take 1 tablet (100 mg total) by mouth at bedtime. For sleep.  30 tablet  1  . pravastatin (PRAVACHOL) 40 MG tablet Take 1 tablet (40 mg total) by mouth every evening.  30 tablet  6   No current  facility-administered medications for this visit.    BP 116/80  Pulse 67  Ht 5\' 6"  (1.676 m)  Wt 174 lb (78.926 kg)  BMI 28.1 kg/m2  SpO2 99%  LMP 06/29/2012 General: NAD Neck: JVP 7 cm, no thyromegaly or thyroid nodule.  Lungs: Slightly distant breath sounds.   CV: Nondisplaced PMI.  Heart regular S1/S2, no S3/S4, no murmur.  No peripheral edema.  No carotid bruit.  Normal pedal pulses.  Abdomen: Soft, nontender, no hepatosplenomegaly, no distention.  Neurologic: Alert and oriented x 3.  Psych: Normal affect. Extremities: No clubbing or cyanosis.   Assessment/Plan:  1. Exertional dyspnea: She does not look volume overloaded on exam.  She continues on a stable Lasix dose for exertional dyspnea.  She had RHC in 10/12 that did not show significan pulmonary hypertension.  I suspect that a significant portion of her dyspnea is due to COPD.  She is getting PFTs and has an appointment with pulmonary.   2. Hyperlipidemia: She needs to be on a statin with nonobstructive CAD.  I will start her on pravastatin 40 mg daily with lipids/LFTs in 2 months.  3. HTN: BP is under better control.  This may contribute to the improvement in her tricuspid regurgitation (lower PCWP, lower PA/RV pressure so less TR).   4. Tricuspid regurgitation: Only mild on last echo.  5. Smoking: Active smoker.  I strongly advised her to quit.   Marca Ancona 08/11/2012 2:04 PM

## 2012-08-12 ENCOUNTER — Ambulatory Visit (INDEPENDENT_AMBULATORY_CARE_PROVIDER_SITE_OTHER): Payer: No Typology Code available for payment source | Admitting: Pulmonary Disease

## 2012-08-12 ENCOUNTER — Encounter: Payer: Self-pay | Admitting: Pulmonary Disease

## 2012-08-12 VITALS — BP 112/84 | HR 71 | Temp 97.9°F | Ht 66.0 in | Wt 178.0 lb

## 2012-08-12 DIAGNOSIS — R053 Chronic cough: Secondary | ICD-10-CM | POA: Insufficient documentation

## 2012-08-12 DIAGNOSIS — R0609 Other forms of dyspnea: Secondary | ICD-10-CM

## 2012-08-12 DIAGNOSIS — R05 Cough: Secondary | ICD-10-CM

## 2012-08-12 DIAGNOSIS — I272 Pulmonary hypertension, unspecified: Secondary | ICD-10-CM

## 2012-08-12 DIAGNOSIS — I2789 Other specified pulmonary heart diseases: Secondary | ICD-10-CM

## 2012-08-12 LAB — PULMONARY FUNCTION TEST

## 2012-08-12 NOTE — Assessment & Plan Note (Signed)
The patient has dyspnea on exertion with more moderate to heavy activities.  Surprisingly, she has.  Little air flow obstruction, and it is only manifested as air trapping and not abnormal spirometry.  She also has mild restriction that I suspect is related to her weight, and a mild decrease in diffusion capacity that is probably related to her underlying chronic diastolic heart failure with elevated pulmonary artery pressures.  I have recommended to the patient to totally quit smoking, work on some type of conditioning program, and to continue maintaining adequate control of her blood pressure.  I suspect this will result in significant improvement in her breathing.  I am happy to see her again if her breathing worsens, or if new issues arise.

## 2012-08-12 NOTE — Progress Notes (Signed)
  Subjective:    Patient ID: Morgan Bean, female    DOB: 1965/10/27, 47 y.o.   MRN: [id number]  HPI The patient is a 47 year old female who I've been asked to see for dyspnea on exertion and chronic cough.  The patient states she has had breathing issues for quite a while, and thinks they may be getting worse.  She describes a 1-2 block dyspnea on exertion on flat ground at a moderate pace, and will get winded walking up one flight of stairs.  She can also get short of breath bringing groceries in from the car.  The patient has a history of smoking 2 packs per day for many years, but is down to 5 cigarettes a day currently.  She also has a chronic cough and bearing in intensity, that is primarily dry in nature.  She describes a tickle in her throat as well as a classic globus sensation, and can often have cough paroxysms one to 2 episodes a day.  She tells me that she was diagnosed in the last year or so with asthma, but has never had pulmonary function studies prior to this.  She did not have any history of asthma dating back to a young adult.  She has had a recent chest x-ray that was unremarkable.  It should be noted the patient is on lisinopril for her hypertension.   Review of Systems  Constitutional: Negative for fever and unexpected weight change.  HENT: Positive for congestion, sore throat, sneezing, trouble swallowing and dental problem. Negative for ear pain, nosebleeds, rhinorrhea, postnasal drip and sinus pressure.   Eyes: Negative for redness and itching.  Respiratory: Positive for shortness of breath. Negative for cough, chest tightness and wheezing.   Cardiovascular: Positive for chest pain. Negative for palpitations and leg swelling.  Gastrointestinal: Negative for nausea and vomiting.  Genitourinary: Negative for dysuria.  Musculoskeletal: Positive for joint swelling and arthralgias.  Skin: Negative for rash.  Neurological: Negative for headaches.  Hematological: Does not  bruise/bleed easily.  Psychiatric/Behavioral: Positive for dysphoric mood. The patient is nervous/anxious.        Objective:   Physical Exam Constitutional:  Well developed, no acute distress  HENT:  Nares patent without discharge  Oropharynx without exudate, palate and uvula are normal  Eyes:  Perrla, eomi, no scleral icterus  Neck:  No JVD, no TMG  Cardiovascular:  Normal rate, regular rhythm, no rubs or gallops.  No murmurs        Intact distal pulses  Pulmonary :  Minimally decreased bs, no stridor or respiratory distress   No rales, rhonchi, or wheezing  Abdominal:  Soft, nondistended, bowel sounds present.  No tenderness noted.   Musculoskeletal:  No lower extremity edema noted.  Lymph Nodes:  No cervical lymphadenopathy noted  Skin:  No cyanosis noted  Neurologic:  Alert, appropriate, moves all 4 extremities without obvious deficit.         Assessment & Plan:

## 2012-08-12 NOTE — Progress Notes (Signed)
PFT done today. 

## 2012-08-12 NOTE — Assessment & Plan Note (Signed)
The patient has a dry and hacky chronic cough that clearly is coming from her upper airway.  She is describing a tickle in her throat, as well as a classic globus sensation.  I suspect it is related to her ongoing smoking, but also her lisinopril.  I have asked the patient to consider total smoking cessation, and will also send a note to her cardiologist to see if she can come off the lisinopril as a trial.

## 2012-08-12 NOTE — Patient Instructions (Addendum)
Stop smoking.  Not only will this help your breathing, but also your cough Would continue albuterol as needed for rescue only.  You had very little obstruction of your airways.  I suspect this will go away with smoking cessation. Work on an exercise program now that your blood pressure has stabilized. Will send a note to your cardiologist recommending that you come off lisinopril for 8 weeks to see if your "coughing jags" will improve.   If you continue to have breathing issues, or if your breathing worsens, would be happy to see you again.

## 2012-08-17 ENCOUNTER — Telehealth: Payer: Self-pay | Admitting: *Deleted

## 2012-08-17 DIAGNOSIS — I10 Essential (primary) hypertension: Secondary | ICD-10-CM

## 2012-08-17 NOTE — Telephone Encounter (Signed)
lmptcb to go over recommendations from [place]. PA about d/c lisinopril and start losartan 50 qd with bmet and BP check w/RN 2 weeks after starting losartan.

## 2012-08-17 NOTE — Telephone Encounter (Signed)
Message copied by Tarri Fuller on Wed Aug 17, 2012  9:44 AM ------      Message from: [place], [place] T      Created: Tue Aug 16, 2012  5:39 PM      Regarding: med change       Okey Regal      I have reviewed her cough and medications with Dr. Shelle Iron and Dr. Shirlee Latch.       Have her stop Lisinopril (can cause a cough).      Start Losartan 50 mg QD.      Check BMET and BP with RN 2 weeks after changing medications.      Tereso Newcomer, PA-C        08/16/2012 5:41 PM             ----- Message -----         From: Barbaraann Share, MD         Sent: 08/12/2012   2:58 PM           To: Beatrice Lecher, PA-C            Scott, please see my note on this pt.  She is having cough paroxysms with globus sensation and tickle.  Part of this is due to her smoking, but she is also on an ACE.  I would consider giving her 8 weeks off the ACE to see if things improve.  She has minimal obstructive lung disease.        ------

## 2012-08-18 MED ORDER — LOSARTAN POTASSIUM 50 MG PO TABS: 50.0000 mg | ORAL_TABLET | Freq: Every day | ORAL | Status: DC

## 2012-08-18 NOTE — Telephone Encounter (Signed)
Follow Up ° ° ° ° ° ° °Pt returning phone call. Please call back. °

## 2012-08-18 NOTE — Telephone Encounter (Signed)
Pt notified to stop lisinopril 08/18/12 due to c/o cough. Will start losartan 50 qd 08/19/12, rx sent into [facility] today; I first called the [facility] Dept to see if I could rx there, but pharmacist states they do not carry losartan and needs to be called into a retail pharmacy.; pt scheduled for BP check and BMET 09/02/12, pt verbalized understanding today to all instructions

## 2012-08-18 NOTE — Telephone Encounter (Signed)
lmptcb x 2 to discuss recommendations from Scott W. PA about d/c lisinopril, see notes 

## 2012-08-18 NOTE — Telephone Encounter (Signed)
Pt notified to stop lisinopril 08/18/12 due to c/o cough. Will start losartan 50 qd 08/19/12, rx sent into [facility] today; I first called the [facility] Dept to see if I could rx there, but pharmacist states they do not carry losartan and needs to be called into a retail pharmacy.

## 2012-08-18 NOTE — Telephone Encounter (Signed)
lmptcb x 2 to discuss recommendations from [place] W. PA about d/c lisinopril, see notes

## 2012-08-26 ENCOUNTER — Encounter: Payer: Self-pay | Admitting: Pulmonary Disease

## 2012-09-02 ENCOUNTER — Telehealth: Payer: Self-pay | Admitting: *Deleted

## 2012-09-02 ENCOUNTER — Ambulatory Visit (INDEPENDENT_AMBULATORY_CARE_PROVIDER_SITE_OTHER): Payer: No Typology Code available for payment source

## 2012-09-02 ENCOUNTER — Other Ambulatory Visit: Payer: Self-pay | Admitting: *Deleted

## 2012-09-02 VITALS — BP 84/60 | HR 67 | Ht 66.0 in | Wt 176.0 lb

## 2012-09-02 DIAGNOSIS — I5032 Chronic diastolic (congestive) heart failure: Secondary | ICD-10-CM

## 2012-09-02 DIAGNOSIS — I10 Essential (primary) hypertension: Secondary | ICD-10-CM

## 2012-09-02 LAB — BASIC METABOLIC PANEL
BUN: 22 mg/dL (ref 6–23)
CO2: 27 mEq/L (ref 19–32)
Calcium: 9.4 mg/dL (ref 8.4–10.5)
GFR: 42.19 mL/min — ABNORMAL LOW (ref >60.00)
Glucose, Bld: 76 mg/dL (ref 70–99)

## 2012-09-02 MED ORDER — AMLODIPINE BESYLATE 10 MG PO TABS: 10.0000 mg | ORAL_TABLET | Freq: Every day | ORAL | Status: DC

## 2012-09-02 MED ORDER — LOSARTAN POTASSIUM 50 MG PO TABS: 50.0000 mg | ORAL_TABLET | Freq: Every day | ORAL | Status: DC

## 2012-09-02 NOTE — Progress Notes (Signed)
**Note De-Identified  Obfuscation** Pt arrives in office for a BP check and BMET.  Per conversation between [employer], Dr. Shirlee Latch and Dr. Shelle Iron on 5/28 and due to pt having cough paroxysms with globus sensation and tickle pt was advised to stop taking Lisinopril and start taking Losartan 50 mg daily and to have BMET and BP check in 2 weeks.   Pt c/o dizziness that has gotten worse since she switched from Lisinopril to Losartan. Her BP today is 84/60 with a HR of 67.   Per Tereso Newcomer, PA-C pt is advised to hold Amlodipine, drink plenty of fluids and return to office on Monday 6/16 @ 10 am for a BP check, pt verbalized understanding.

## 2012-09-02 NOTE — Telephone Encounter (Signed)
Message copied by Tarri Fuller on Fri Sep 02, 2012  5:02 PM ------      Message from: [place], [place] T      Created: Fri Sep 02, 2012  1:17 PM       Patient in office today for BP check.      BP was running low.      She has already been advised to hold Amlodipine.      Creatinine elevated.      Have her hold Losartan as well.      She is coming back for BP check on Monday.      Schedule repeat BMET on Monday 6/16. ------

## 2012-09-02 NOTE — Telephone Encounter (Signed)
pt notified about lab result and to hold her losartan as well now, bmet and BP check on 12/06/12

## 2012-09-05 ENCOUNTER — Encounter: Payer: Self-pay | Admitting: *Deleted

## 2012-09-05 ENCOUNTER — Other Ambulatory Visit (INDEPENDENT_AMBULATORY_CARE_PROVIDER_SITE_OTHER): Payer: No Typology Code available for payment source

## 2012-09-05 ENCOUNTER — Ambulatory Visit (INDEPENDENT_AMBULATORY_CARE_PROVIDER_SITE_OTHER): Payer: No Typology Code available for payment source | Admitting: *Deleted

## 2012-09-05 VITALS — BP 102/78 | HR 65 | Ht 66.0 in | Wt 180.0 lb

## 2012-09-05 DIAGNOSIS — I10 Essential (primary) hypertension: Secondary | ICD-10-CM

## 2012-09-05 DIAGNOSIS — I5032 Chronic diastolic (congestive) heart failure: Secondary | ICD-10-CM

## 2012-09-05 LAB — BASIC METABOLIC PANEL
BUN: 16 mg/dL (ref 6–23)
CO2: 25 mEq/L (ref 19–32)
Calcium: 9.1 mg/dL (ref 8.4–10.5)
Creatinine, Ser: 1.1 mg/dL (ref 0.4–1.2)

## 2012-09-05 NOTE — Progress Notes (Signed)
Patient came in for BMP and labs.  She feels better,  I have discussed her reading with Lilian Coma.  We will continue to hold her medications until we get her labs results and I have scheduled her to see the him in 3 weeks

## 2012-09-05 NOTE — Patient Instructions (Signed)
Your physician recommends that you schedule a follow-up appointment in: 3 weeks with Morgan Bean

## 2012-09-14 ENCOUNTER — Ambulatory Visit (INDEPENDENT_AMBULATORY_CARE_PROVIDER_SITE_OTHER): Payer: No Typology Code available for payment source | Admitting: Family Medicine

## 2012-09-14 ENCOUNTER — Encounter: Payer: Self-pay | Admitting: Family Medicine

## 2012-09-14 VITALS — BP 166/115 | HR 65 | Temp 98.7°F | Ht 66.0 in | Wt 186.5 lb

## 2012-09-14 DIAGNOSIS — N951 Menopausal and female climacteric states: Secondary | ICD-10-CM

## 2012-09-14 DIAGNOSIS — Z01419 Encounter for gynecological examination (general) (routine) without abnormal findings: Secondary | ICD-10-CM

## 2012-09-14 DIAGNOSIS — D259 Leiomyoma of uterus, unspecified: Secondary | ICD-10-CM

## 2012-09-14 DIAGNOSIS — Z1231 Encounter for screening mammogram for malignant neoplasm of breast: Secondary | ICD-10-CM

## 2012-09-14 DIAGNOSIS — Z124 Encounter for screening for malignant neoplasm of cervix: Secondary | ICD-10-CM

## 2012-09-14 NOTE — Progress Notes (Signed)
  Subjective:     Morgan Bean is a 47 y.o. female and is here for a comprehensive physical exam. The patient reports problems - no pap for some time.  Needs mammogram.  Long h/o fibroids and abnl bleeding.  But now with hot flashes and cycles q 3-4 months only. Less pain.  She reports no breast lumps or masses.  Continued smoking with DOE and SOB being worked up by pulmonary. She is followed for depressed mood and arthralgia.  History   Social History  . Marital Status: Single    Spouse Name: N/A    Number of Children: 2  . Years of Education: N/A   Occupational History  . unemployed    Social History Main Topics  . Smoking status: Current Every Day Smoker -- 0.50 packs/day for 20 years    Types: Cigarettes  . Smokeless tobacco: Never Used     Comment: 5 cigs --- uses E-cig  . Alcohol Use: Yes     Comment: pint of wine 2-3 times a week--ocassional  . Drug Use: 1.00 per week    Special: "Crack" cocaine     Comment: trying to quit---- quit November 2013   . Sexually Active: Not on file   Other Topics Concern  . Not on file   Social History Narrative   Lives with mom, sisters and brothers.   Health Maintenance  Topic Date Due  . Tetanus/tdap  06/15/1982  . Influenza Vaccine  11/21/2012  . Pap Smear  12/08/2013    The following portions of the patient's history were reviewed and updated as appropriate: allergies, current medications, past family history, past medical history, past social history, past surgical history and problem list.  Review of Systems Pertinent items are noted in HPI.   Objective:    BP 166/115  Pulse 65  Temp(Src) 98.7 F (37.1 C) (Oral)  Ht 5\' 6"  (1.676 m)  Wt 186 lb 8 oz (84.596 kg)  BMI 30.12 kg/m2  LMP 06/29/2012 General appearance: alert, cooperative and appears stated age Head: Normocephalic, without obvious abnormality, atraumatic Neck: no adenopathy, supple, symmetrical, trachea midline and thyroid not enlarged, symmetric, no  tenderness/mass/nodules Lungs: clear to auscultation bilaterally Breasts: normal appearance, no masses or tenderness, Normal to palpation without dominant masses Heart: regular rate and rhythm, S1, S2 normal, no murmur, click, rub or gallop Abdomen: soft, non-tender; bowel sounds normal; no masses,  no organomegaly Pelvic: cervix normal in appearance, external genitalia normal, no adnexal masses or tenderness, no cervical motion tenderness, vagina normal without discharge and Uterus is 14 wk size and firm consistent with fibroids.  Old tampon removed from vagina. Extremities: extremities normal, atraumatic, no cyanosis or edema Pulses: 2+ and symmetric Skin: Skin color, texture, turgor normal. No rashes or lesions Lymph nodes: Cervical, supraclavicular, and axillary nodes normal. Neurologic: Grossly normal    Assessment:     GYN  female exam.      Plan:    No need for further treatment of fibroids, as long as menopause continues to progress. Update mammogram and pap--if nml and neg. HPV, would not need another until 2019. See After Visit Summary for Counseling Recommendations

## 2012-09-14 NOTE — Patient Instructions (Signed)
Smoking Cessation Quitting smoking is important to your health and has many advantages. However, it is not always easy to quit since nicotine is a very addictive drug. Often times, people try 3 times or more before being able to quit. This document explains the best ways for you to prepare to quit smoking. Quitting takes hard work and a lot of effort, but you can do it. ADVANTAGES OF QUITTING SMOKING  You will live longer, feel better, and live better.  Your body will feel the impact of quitting smoking almost immediately.  Within 20 minutes, blood pressure decreases. Your pulse returns to its normal level.  After 8 hours, carbon monoxide levels in the blood return to normal. Your oxygen level increases.  After 24 hours, the chance of having a heart attack starts to decrease. Your breath, hair, and body stop smelling like smoke.  After 48 hours, damaged nerve endings begin to recover. Your sense of taste and smell improve.  After 72 hours, the body is virtually free of nicotine. Your bronchial tubes relax and breathing becomes easier.  After 2 to 12 weeks, lungs can hold more air. Exercise becomes easier and circulation improves.  The risk of having a heart attack, stroke, cancer, or lung disease is greatly reduced.  After 1 year, the risk of coronary heart disease is cut in half.  After 5 years, the risk of stroke falls to the same as a nonsmoker.  After 10 years, the risk of lung cancer is cut in half and the risk of other cancers decreases significantly.  After 15 years, the risk of coronary heart disease drops, usually to the level of a nonsmoker.  If you are pregnant, quitting smoking will improve your chances of having a healthy baby.  The people you live with, especially any children, will be healthier.  You will have extra money to spend on things other than cigarettes. QUESTIONS TO THINK ABOUT BEFORE ATTEMPTING TO QUIT You may want to talk about your answers with your  caregiver.  Why do you want to quit?  If you tried to quit in the past, what helped and what did not?  What will be the most difficult situations for you after you quit? How will you plan to handle them?  Who can help you through the tough times? Your family? Friends? A caregiver?  What pleasures do you get from smoking? What ways can you still get pleasure if you quit? Here are some questions to ask your caregiver:  How can you help me to be successful at quitting?  What medicine do you think would be best for me and how should I take it?  What should I do if I need more help?  What is smoking withdrawal like? How can I get information on withdrawal? GET READY  Set a quit date.  Change your environment by getting rid of all cigarettes, ashtrays, matches, and lighters in your home, car, or work. Do not let people smoke in your home.  Review your past attempts to quit. Think about what worked and what did not. GET SUPPORT AND ENCOURAGEMENT You have a better chance of being successful if you have help. You can get support in many ways.  Tell your family, friends, and co-workers that you are going to quit and need their support. Ask them not to smoke around you.  Get individual, group, or telephone counseling and support. Programs are available at local hospitals and health centers. Call your local health department for   information about programs in your area.  Spiritual beliefs and practices may help some smokers quit.  Download a "quit meter" on your computer to keep track of quit statistics, such as how long you have gone without smoking, cigarettes not smoked, and money saved.  Get a self-help book about quitting smoking and staying off of tobacco. LEARN NEW SKILLS AND BEHAVIORS  Distract yourself from urges to smoke. Talk to someone, go for a walk, or occupy your time with a task.  Change your normal routine. Take a different route to work. Drink tea instead of coffee.  Eat breakfast in a different place.  Reduce your stress. Take a hot bath, exercise, or read a book.  Plan something enjoyable to do every day. Reward yourself for not smoking.  Explore interactive web-based programs that specialize in helping you quit. GET MEDICINE AND USE IT CORRECTLY Medicines can help you stop smoking and decrease the urge to smoke. Combining medicine with the above behavioral methods and support can greatly increase your chances of successfully quitting smoking.  Nicotine replacement therapy helps deliver nicotine to your body without the negative effects and risks of smoking. Nicotine replacement therapy includes nicotine gum, lozenges, inhalers, nasal sprays, and skin patches. Some may be available over-the-counter and others require a prescription.  Antidepressant medicine helps people abstain from smoking, but how this works is unknown. This medicine is available by prescription.  Nicotinic receptor partial agonist medicine simulates the effect of nicotine in your brain. This medicine is available by prescription. Ask your caregiver for advice about which medicines to use and how to use them based on your health history. Your caregiver will tell you what side effects to look out for if you choose to be on a medicine or therapy. Carefully read the information on the package. Do not use any other product containing nicotine while using a nicotine replacement product.  RELAPSE OR DIFFICULT SITUATIONS Most relapses occur within the first 3 months after quitting. Do not be discouraged if you start smoking again. Remember, most people try several times before finally quitting. You may have symptoms of withdrawal because your body is used to nicotine. You may crave cigarettes, be irritable, feel very hungry, cough often, get headaches, or have difficulty concentrating. The withdrawal symptoms are only temporary. They are strongest when you first quit, but they will go away within  10 14 days. To reduce the chances of relapse, try to:  Avoid drinking alcohol. Drinking lowers your chances of successfully quitting.  Reduce the amount of caffeine you consume. Once you quit smoking, the amount of caffeine in your body increases and can give you symptoms, such as a rapid heartbeat, sweating, and anxiety.  Avoid smokers because they can make you want to smoke.  Do not let weight gain distract you. Many smokers will gain weight when they quit, usually less than 10 pounds. Eat a healthy diet and stay active. You can always lose the weight gained after you quit.  Find ways to improve your mood other than smoking. FOR MORE INFORMATION  www.smokefree.gov  Document Released: 03/03/2001 Document Revised: 09/08/2011 Document Reviewed: 06/18/2011 [facility] Patient Information 2014 [place], [place]. Preventive Care for Adults, Female A healthy lifestyle and preventive care can promote health and wellness. Preventive health guidelines for women include the following key practices.  A routine yearly physical is a good way to check with your caregiver about your health and preventive screening. It is a chance to share any concerns and updates on your  health, and to receive a thorough exam.  Visit your dentist for a routine exam and preventive care every 6 months. Brush your teeth twice a day and floss once a day. Good oral hygiene prevents tooth decay and gum disease.  The frequency of eye exams is based on your age, health, family medical history, use of contact lenses, and other factors. Follow your caregiver's recommendations for frequency of eye exams.  Eat a healthy diet. Foods like vegetables, fruits, whole grains, low-fat dairy products, and lean protein foods contain the nutrients you need without too many calories. Decrease your intake of foods high in solid fats, added sugars, and salt. Eat the right amount of calories for you.Get information about a proper diet from your  caregiver, if necessary.  Regular physical exercise is one of the most important things you can do for your health. Most adults should get at least 150 minutes of moderate-intensity exercise (any activity that increases your heart rate and causes you to sweat) each week. In addition, most adults need muscle-strengthening exercises on 2 or more days a week.  Maintain a healthy weight. The body mass index (BMI) is a screening tool to identify possible weight problems. It provides an estimate of body fat based on height and weight. Your caregiver can help determine your BMI, and can help you achieve or maintain a healthy weight.For adults 20 years and older:  A BMI below 18.5 is considered underweight.  A BMI of 18.5 to 24.9 is normal.  A BMI of 25 to 29.9 is considered overweight.  A BMI of 30 and above is considered obese.  Maintain normal blood lipids and cholesterol levels by exercising and minimizing your intake of saturated fat. Eat a balanced diet with plenty of fruit and vegetables. Blood tests for lipids and cholesterol should begin at age 42 and be repeated every 5 years. If your lipid or cholesterol levels are high, you are over 50, or you are at high risk for heart disease, you may need your cholesterol levels checked more frequently.Ongoing high lipid and cholesterol levels should be treated with medicines if diet and exercise are not effective.  If you smoke, find out from your caregiver how to quit. If you do not use tobacco, do not start.  If you are pregnant, do not drink alcohol. If you are breastfeeding, be very cautious about drinking alcohol. If you are not pregnant and choose to drink alcohol, do not exceed 1 drink per day. One drink is considered to be 12 ounces (355 mL) of beer, 5 ounces (148 mL) of wine, or 1.5 ounces (44 mL) of liquor.  Avoid use of street drugs. Do not share needles with anyone. Ask for help if you need support or instructions about stopping the use of  drugs.  High blood pressure causes heart disease and increases the risk of stroke. Your blood pressure should be checked at least every 1 to 2 years. Ongoing high blood pressure should be treated with medicines if weight loss and exercise are not effective.  If you are 7 to 49 years old, ask your caregiver if you should take aspirin to prevent strokes.  Diabetes screening involves taking a blood sample to check your fasting blood sugar level. This should be done once every 3 years, after age 70, if you are within normal weight and without risk factors for diabetes. Testing should be considered at a younger age or be carried out more frequently if you are overweight and have at  least 1 risk factor for diabetes.  Breast cancer screening is essential preventive care for women. You should practice "breast self-awareness." This means understanding the normal appearance and feel of your breasts and may include breast self-examination. Any changes detected, no matter how small, should be reported to a caregiver. Women in their 32s and 30s should have a clinical breast exam (CBE) by a caregiver as part of a regular health exam every 1 to 3 years. After age 69, women should have a CBE every year. Starting at age 52, women should consider having a mammography (breast X-ray test) every year. Women who have a family history of breast cancer should talk to their caregiver about genetic screening. Women at a high risk of breast cancer should talk to their caregivers about having magnetic resonance imaging (MRI) and a mammography every year.  The Pap test is a screening test for cervical cancer. A Pap test can show cell changes on the cervix that might become cervical cancer if left untreated. A Pap test is a procedure in which cells are obtained and examined from the lower end of the uterus (cervix).  Women should have a Pap test starting at age 60.  Between ages 47 and 49, Pap tests should be repeated every 2  years.  Beginning at age 41, you should have a Pap test every 3 years as long as the past 3 Pap tests have been normal.  Some women have medical problems that increase the chance of getting cervical cancer. Talk to your caregiver about these problems. It is especially important to talk to your caregiver if a new problem develops soon after your last Pap test. In these cases, your caregiver may recommend more frequent screening and Pap tests.  The above recommendations are the same for women who have or have not gotten the vaccine for human papillomavirus (HPV).  If you had a hysterectomy for a problem that was not cancer or a condition that could lead to cancer, then you no longer need Pap tests. Even if you no longer need a Pap test, a regular exam is a good idea to make sure no other problems are starting.  If you are between ages 58 and 61, and you have had normal Pap tests going back 10 years, you no longer need Pap tests. Even if you no longer need a Pap test, a regular exam is a good idea to make sure no other problems are starting.  If you have had past treatment for cervical cancer or a condition that could lead to cancer, you need Pap tests and screening for cancer for at least 20 years after your treatment.  If Pap tests have been discontinued, risk factors (such as a new sexual partner) need to be reassessed to determine if screening should be resumed.  The HPV test is an additional test that may be used for cervical cancer screening. The HPV test looks for the virus that can cause the cell changes on the cervix. The cells collected during the Pap test can be tested for HPV. The HPV test could be used to screen women aged 64 years and older, and should be used in women of any age who have unclear Pap test results. After the age of 57, women should have HPV testing at the same frequency as a Pap test.  Colorectal cancer can be detected and often prevented. Most routine colorectal cancer  screening begins at the age of 71 and continues through age 52. However,  your caregiver may recommend screening at an earlier age if you have risk factors for colon cancer. On a yearly basis, your caregiver may provide home test kits to check for hidden blood in the stool. Use of a small camera at the end of a tube, to directly examine the colon (sigmoidoscopy or colonoscopy), can detect the earliest forms of colorectal cancer. Talk to your caregiver about this at age 38, when routine screening begins. Direct examination of the colon should be repeated every 5 to 10 years through age 85, unless early forms of pre-cancerous polyps or small growths are found.  Hepatitis C blood testing is recommended for all people born from [birth year] through 1965 and any individual with known risks for hepatitis C.  Practice safe sex. Use condoms and avoid high-risk sexual practices to reduce the spread of sexually transmitted infections (STIs). STIs include gonorrhea, chlamydia, syphilis, trichomonas, herpes, HPV, and human immunodeficiency virus (HIV). Herpes, HIV, and HPV are viral illnesses that have no cure. They can result in disability, cancer, and death. Sexually active women aged 60 and younger should be checked for chlamydia. Older women with new or multiple partners should also be tested for chlamydia. Testing for other STIs is recommended if you are sexually active and at increased risk.  Osteoporosis is a disease in which the bones lose minerals and strength with aging. This can result in serious bone fractures. The risk of osteoporosis can be identified using a bone density scan. Women ages 35 and over and women at risk for fractures or osteoporosis should discuss screening with their caregivers. Ask your caregiver whether you should take a calcium supplement or vitamin D to reduce the rate of osteoporosis.  Menopause can be associated with physical symptoms and risks. Hormone replacement therapy is available to  decrease symptoms and risks. You should talk to your caregiver about whether hormone replacement therapy is right for you.  Use sunscreen with sun protection factor (SPF) of 30 or more. Apply sunscreen liberally and repeatedly throughout the day. You should seek shade when your shadow is shorter than you. Protect yourself by wearing long sleeves, pants, a wide-brimmed hat, and sunglasses year round, whenever you are outdoors.  Once a month, do a whole body skin exam, using a mirror to look at the skin on your back. Notify your caregiver of new moles, moles that have irregular borders, moles that are larger than a pencil eraser, or moles that have changed in shape or color.  Stay current with required immunizations.  Influenza. You need a dose every fall (or winter). The composition of the flu vaccine changes each year, so being vaccinated once is not enough.  Pneumococcal polysaccharide. You need 1 to 2 doses if you smoke cigarettes or if you have certain chronic medical conditions. You need 1 dose at age 93 (or older) if you have never been vaccinated.  Tetanus, diphtheria, pertussis (Tdap, Td). Get 1 dose of Tdap vaccine if you are younger than age 22, are over 25 and have contact with an infant, are a Research scientist (physical sciences), are pregnant, or simply want to be protected from whooping cough. After that, you need a Td booster dose every 10 years. Consult your caregiver if you have not had at least 3 tetanus and diphtheria-containing shots sometime in your life or have a deep or dirty wound.  HPV. You need this vaccine if you are a woman age 62 or younger. The vaccine is given in 3 doses over 6 months.  Measles,  mumps, rubella (MMR). You need at least 1 dose of MMR if you were born in 1957 or later. You may also need a second dose.  Meningococcal. If you are age 32 to 69 and a first-year college student living in a residence hall, or have one of several medical conditions, you need to get vaccinated  against meningococcal disease. You may also need additional booster doses.  Zoster (shingles). If you are age 64 or older, you should get this vaccine.  Varicella (chickenpox). If you have never had chickenpox or you were vaccinated but received only 1 dose, talk to your caregiver to find out if you need this vaccine.  Hepatitis A. You need this vaccine if you have a specific risk factor for hepatitis A virus infection or you simply wish to be protected from this disease. The vaccine is usually given as 2 doses, 6 to 18 months apart.  Hepatitis B. You need this vaccine if you have a specific risk factor for hepatitis B virus infection or you simply wish to be protected from this disease. The vaccine is given in 3 doses, usually over 6 months. Preventive Services / Frequency Ages 34 to 106  Blood pressure check.** / Every 1 to 2 years.  Lipid and cholesterol check.** / Every 5 years beginning at age 44.  Clinical breast exam.** / Every 3 years for women in their 71s and 30s.  Pap test.** / Every 2 years from ages 32 through 3. Every 3 years starting at age 69 through age 57 or 44 with a history of 3 consecutive normal Pap tests.  HPV screening.** / Every 3 years from ages 53 through ages 69 to 19 with a history of 3 consecutive normal Pap tests.  Hepatitis C blood test.** / For any individual with known risks for hepatitis C.  Skin self-exam. / Monthly.  Influenza immunization.** / Every year.  Pneumococcal polysaccharide immunization.** / 1 to 2 doses if you smoke cigarettes or if you have certain chronic medical conditions.  Tetanus, diphtheria, pertussis (Tdap, Td) immunization. / A one-time dose of Tdap vaccine. After that, you need a Td booster dose every 10 years.  HPV immunization. / 3 doses over 6 months, if you are 77 and younger.  Measles, mumps, rubella (MMR) immunization. / You need at least 1 dose of MMR if you were born in 1957 or later. You may also need a second  dose.  Meningococcal immunization. / 1 dose if you are age 45 to 68 and a first-year college student living in a residence hall, or have one of several medical conditions, you need to get vaccinated against meningococcal disease. You may also need additional booster doses.  Varicella immunization.** / Consult your caregiver.  Hepatitis A immunization.** / Consult your caregiver. 2 doses, 6 to 18 months apart.  Hepatitis B immunization.** / Consult your caregiver. 3 doses usually over 6 months. Ages 64 to 61  Blood pressure check.** / Every 1 to 2 years.  Lipid and cholesterol check.** / Every 5 years beginning at age 91.  Clinical breast exam.** / Every year after age 58.  Mammogram.** / Every year beginning at age 79 and continuing for as long as you are in good health. Consult with your caregiver.  Pap test.** / Every 3 years starting at age 107 through age 9 or 50 with a history of 3 consecutive normal Pap tests.  HPV screening.** / Every 3 years from ages 79 through ages 47 to 67 with a  history of 3 consecutive normal Pap tests.  Fecal occult blood test (FOBT) of stool. / Every year beginning at age 100 and continuing until age 17. You may not need to do this test if you get a colonoscopy every 10 years.  Flexible sigmoidoscopy or colonoscopy.** / Every 5 years for a flexible sigmoidoscopy or every 10 years for a colonoscopy beginning at age 53 and continuing until age 29.  Hepatitis C blood test.** / For all people born from [birth year] through 1967 and any individual with known risks for hepatitis C.  Skin self-exam. / Monthly.  Influenza immunization.** / Every year.  Pneumococcal polysaccharide immunization.** / 1 to 2 doses if you smoke cigarettes or if you have certain chronic medical conditions.  Tetanus, diphtheria, pertussis (Tdap, Td) immunization.** / A one-time dose of Tdap vaccine. After that, you need a Td booster dose every 10 years.  Measles, mumps, rubella (MMR)  immunization. / You need at least 1 dose of MMR if you were born in 1957 or later. You may also need a second dose.  Varicella immunization.** / Consult your caregiver.  Meningococcal immunization.** / Consult your caregiver.  Hepatitis A immunization.** / Consult your caregiver. 2 doses, 6 to 18 months apart.  Hepatitis B immunization.** / Consult your caregiver. 3 doses, usually over 6 months. Ages 74 and over  Blood pressure check.** / Every 1 to 2 years.  Lipid and cholesterol check.** / Every 5 years beginning at age 62.  Clinical breast exam.** / Every year after age 45.  Mammogram.** / Every year beginning at age 64 and continuing for as long as you are in good health. Consult with your caregiver.  Pap test.** / Every 3 years starting at age 18 through age 56 or 102 with a 3 consecutive normal Pap tests. Testing can be stopped between 65 and 70 with 3 consecutive normal Pap tests and no abnormal Pap or HPV tests in the past 10 years.  HPV screening.** / Every 3 years from ages 73 through ages 41 or 22 with a history of 3 consecutive normal Pap tests. Testing can be stopped between 65 and 70 with 3 consecutive normal Pap tests and no abnormal Pap or HPV tests in the past 10 years.  Fecal occult blood test (FOBT) of stool. / Every year beginning at age 21 and continuing until age 74. You may not need to do this test if you get a colonoscopy every 10 years.  Flexible sigmoidoscopy or colonoscopy.** / Every 5 years for a flexible sigmoidoscopy or every 10 years for a colonoscopy beginning at age 56 and continuing until age 44.  Hepatitis C blood test.** / For all people born from [birth year] through 1967 and any individual with known risks for hepatitis C.  Osteoporosis screening.** / A one-time screening for women ages 73 and over and women at risk for fractures or osteoporosis.  Skin self-exam. / Monthly.  Influenza immunization.** / Every year.  Pneumococcal polysaccharide  immunization.** / 1 dose at age 34 (or older) if you have never been vaccinated.  Tetanus, diphtheria, pertussis (Tdap, Td) immunization. / A one-time dose of Tdap vaccine if you are over 65 and have contact with an infant, are a Research scientist (physical sciences), or simply want to be protected from whooping cough. After that, you need a Td booster dose every 10 years.  Varicella immunization.** / Consult your caregiver.  Meningococcal immunization.** / Consult your caregiver.  Hepatitis A immunization.** / Consult your caregiver. 2 doses, 6 to  18 months apart.  Hepatitis B immunization.** / Check with your caregiver. 3 doses, usually over 6 months. ** Family history and personal history of risk and conditions may change your caregiver's recommendations. Document Released: 05/05/2001 Document Revised: 06/01/2011 Document Reviewed: 08/04/2010 [facility] Patient Information 2014 [place], [place].

## 2012-09-22 ENCOUNTER — Ambulatory Visit (HOSPITAL_COMMUNITY)
Admission: RE | Admit: 2012-09-22 | Discharge: 2012-09-22 | Disposition: A | Discharge status: Home / Self Care | Payer: No Typology Code available for payment source | Source: Ambulatory Visit | Attending: Family Medicine | Admitting: Family Medicine

## 2012-09-22 DIAGNOSIS — Z1231 Encounter for screening mammogram for malignant neoplasm of breast: Secondary | ICD-10-CM | POA: Insufficient documentation

## 2012-09-27 ENCOUNTER — Ambulatory Visit (INDEPENDENT_AMBULATORY_CARE_PROVIDER_SITE_OTHER): Payer: No Typology Code available for payment source | Admitting: Physician Assistant

## 2012-09-27 ENCOUNTER — Encounter: Payer: Self-pay | Admitting: Physician Assistant

## 2012-09-27 VITALS — BP 119/85 | HR 56 | Ht 66.0 in | Wt 182.0 lb

## 2012-09-27 DIAGNOSIS — R0989 Other specified symptoms and signs involving the circulatory and respiratory systems: Secondary | ICD-10-CM

## 2012-09-27 DIAGNOSIS — I5032 Chronic diastolic (congestive) heart failure: Secondary | ICD-10-CM

## 2012-09-27 DIAGNOSIS — I10 Essential (primary) hypertension: Secondary | ICD-10-CM

## 2012-09-27 DIAGNOSIS — I2510 Atherosclerotic heart disease of native coronary artery without angina pectoris: Secondary | ICD-10-CM

## 2012-09-27 DIAGNOSIS — R0609 Other forms of dyspnea: Secondary | ICD-10-CM

## 2012-09-27 DIAGNOSIS — R0600 Dyspnea, unspecified: Secondary | ICD-10-CM

## 2012-09-27 DIAGNOSIS — E785 Hyperlipidemia, unspecified: Secondary | ICD-10-CM

## 2012-09-27 DIAGNOSIS — F17200 Nicotine dependence, unspecified, uncomplicated: Secondary | ICD-10-CM

## 2012-09-27 NOTE — Patient Instructions (Addendum)
NO CHANGES WERE MADE TODAY WITH YOUR MEDICATIONS  PLEASE FOLLOW UP WITH [facility] 01/02/13 @ 9:50 AM

## 2012-09-27 NOTE — Progress Notes (Signed)
[address] [place], [place]  [address] Phone: [phone] Fax:  [phone]  Date:  09/27/2012   ID:  Morgan Bean, DOB December 22, 1965, MRN [id number]  PCP:  No PCP Per Patient  Cardiologist:  Dr. Marca Ancona     History of Present Illness: Morgan Bean is a 47 y.o. female who returns for f/u.   She has a hx of diastolic CHF, tricuspid regurgitation, abnormal echo with mod pulmonary HTN in 11/2009 and nonobstructive CAD.   She has continued to have problems with exertional dyspnea.  When last seen by Dr. Marca Ancona in 07/2012, she reported being short of breath after walking 1/2-1 block or walking up a flight of steps.  Last echo showed normal EF, moderate LVH, and only mild TR (was severe in the past). BP had been under control. She still had occasional atypical chest pain but nothing exertional. She continued to smoke and is thought to have COPD. She saw Dr. Shelle Iron and PFTs did not show much airflow obstruction. She had a lot of upper airway symptoms including cough and he contacted [place] to see if she could come off the ACE.  We switched her to an ARB.  At f/u RN visit, her BP was 84/60 and she was symptomatic.  Amlodipine was held and her creatinine was increased on lab work.  Therefore, her Losartan was also held.  BP was improved at f/u RN visit.  She was asked to f/u today to reassess her BP control.  She is doing well.  Breathing is much better.  She is NYHA Class II.  No CP.  No syncope or dizziness.  No orthopnea, PND, edema.  No further cough.    Labs (9/12):   HCT 31.5, BNP 132, K 4, creatinine 1.2  Labs (10/12): LDL 142, HDL 46, K 4.2, creatinine 1.3  Labs (4/14):   K 4.3, creatinine 1.1, BNP 190, LDL 97, HDL 45  Labs (6/14):   K 4.8 => 4.4, Cr 1.7 => 1.1   Wt Readings from Last 3 Encounters:  09/27/12 182 lb (82.555 kg)  09/14/12 186 lb 8 oz (84.596 kg)  09/05/12 180 lb (81.647 kg)     Past Medical History  Diagnosis Date  . Coronary artery disease       Lexiscan myoview (10/12) with significant ST depression upon Lexiscan injection but no ischemia or infarction on perfusion images. Left heart cath (10/12): 30% mid LAD, 30% ostial D1, 50% ostial D2.    . Stroke   . Hypertension   . Tricuspid regurgitation   . Iron deficiency     hx of  . Polysubstance abuse     Prior cocaine  . Tobacco abuse   . Leukocytoclastic vasculitis     Rash across lower body, occurred in 9/11, diagnosed by skin biopsy. ANA positive. Thought to be secondary to cocaine use.  . Pulmonary HTN     Echo (9/11) with EF 65%, mild LVH, mild AI, mild MR, moderate to possibly severe TR with PA systolic pressure 52 mmHg. Echo (10/12): severe LV hypertrophy, EF 60-65%, mild MR, mild AI, moderate to severe tricuspid regurgitation, PA systolic pressure 38 mmHg.  Echo 4/14: moderate LVH, EF 65%, normal wall motion, diastolic dysfunction, mild AI, mild MR  . Asthma   . Shortness of breath     a. PFTs 5/14:  FEV1/FVC 99% predicted; FVC 60% predicted; DLCO mildly reduced, mild restriction, air trapping.;  b.  seen by pulmo (Dr. Shelle Iron) 5/14:  mainly upper airway symptoms - ACE d/c'd (sx's better)  . Chronic diastolic CHF (congestive heart failure)       Current Outpatient Prescriptions  Medication Sig Dispense Refill  . albuterol (PROVENTIL HFA;VENTOLIN HFA) 108 (90 BASE) MCG/ACT inhaler Inhale 2 puffs into the lungs every 4 (four) hours as needed for wheezing or shortness of breath.  6.7 g  0  . aspirin 81 MG chewable tablet Chew 1 tablet (81 mg total) by mouth daily.  10 tablet  0  . citalopram (CELEXA) 40 MG tablet Take 1 tablet (40 mg total) by mouth daily. For depression and anxiety.  30 tablet  0  . cloNIDine (CATAPRES) 0.3 MG tablet Take 1 tablet (0.3 mg total) by mouth 2 (two) times daily in the am and at bedtime.. For blood pressure control.  60 tablet  11  . ferrous sulfate 325 (65 FE) MG tablet Take 1 tablet (325 mg total) by mouth 2 (two) times daily with a meal. For  anemia.  60 tablet  3  . furosemide (LASIX) 20 MG tablet Take 1 tablet (20 mg total) by mouth 2 (two) times daily.  60 tablet  11  . Multiple Vitamin (MULTIVITAMIN WITH MINERALS) TABS Take 1 tablet by mouth daily. For nutritional supplementation.  30 tablet  0  . polyethylene glycol powder (GLYCOLAX/MIRALAX) powder Take 17 g by mouth daily as needed. Constipation      . potassium chloride (K-DUR,KLOR-CON) 10 MEQ tablet Take 1 tablet (10 mEq total) by mouth 2 (two) times daily with a meal.  60 tablet  11  . pravastatin (PRAVACHOL) 40 MG tablet Take 1 tablet (40 mg total) by mouth every evening.  30 tablet  6  . traZODone (DESYREL) 100 MG tablet Take 1 tablet (100 mg total) by mouth at bedtime. For sleep.  30 tablet  1  . amLODipine (NORVASC) 10 MG tablet Take 1 tablet (10 mg total) by mouth daily at 8 pm. HOLD per [place] Arohi Salvatierra, [facility] 09/02/12      . loratadine (CLARITIN) 10 MG tablet Take 1 tablet (10 mg total) by mouth daily. For seasonal allergies.  30 tablet  3  . losartan (COZAAR) 50 MG tablet Take 1 tablet (50 mg total) by mouth daily. HOLD per [place] Mairead Schwarzkopf, [facility] 09/02/12       No current facility-administered medications for this visit.    Allergies:    Allergies  Allergen Reactions  . Ciprofloxacin Itching    Social History:  The patient  reports that she has been smoking Cigarettes.  She has a 10 pack-year smoking history. She has never used smokeless tobacco. She reports that  drinks alcohol. She reports that she uses illicit drugs ("Crack" cocaine) about once per week.   ROS:  Please see the history of present illness.     All other systems reviewed and negative.   PHYSICAL EXAM: VS:  BP 119/85  Pulse 56  Ht 5\' 6"  (1.676 m)  Wt 182 lb (82.555 kg)  BMI 29.39 kg/m2  LMP 08/27/2012 Well nourished, well developed, in no acute distress HEENT: normal Neck: no JVD Cardiac:  normal S1, S2; RRR; no murmur Lungs:  clear to auscultation bilaterally, no wheezing, rhonchi or rales Abd:  soft, nontender, no hepatomegaly Ext: no edema Skin: warm and dry Neuro:  CNs 2-12 intact, no focal abnormalities noted  EKG:  Sinus brady, HR 56, no changes     ASSESSMENT AND PLAN:  1. Hypertension:  Controlled.  Continue current therapy.  She is now off Amlodipine and Losartan.  She will continue to monitor BPs at home.  2. Dyspnea:  Much improved.  Continue current Rx.  3. Diastolic CHF:  Volume stable.  Continue current Rx.  4. Tricuspid Regurgitation:  Mild by most recent echo.  5. Tobacco Abuse:  She is advised to quit.  6. CAD:  Continue ASA and statin.  7. Hyperlipidemia:  Continue Pravachol.  8. Disposition:  F/u with me in 3 mos.   Signed, Tereso Newcomer, PA-C  09/27/2012 9:54 AM

## 2012-10-10 ENCOUNTER — Other Ambulatory Visit: Payer: Self-pay

## 2012-10-21 ENCOUNTER — Inpatient Hospital Stay (HOSPITAL_COMMUNITY)
Admission: EM | Admit: 2012-10-21 | Discharge: 2012-10-24 | DRG: 313 | Disposition: A | Discharge status: Home / Self Care | Payer: MEDICAID | Attending: Internal Medicine | Admitting: Internal Medicine

## 2012-10-21 ENCOUNTER — Ambulatory Visit: Payer: No Typology Code available for payment source | Attending: Family Medicine | Admitting: Family Medicine

## 2012-10-21 ENCOUNTER — Emergency Department (HOSPITAL_COMMUNITY): Payer: MEDICAID

## 2012-10-21 ENCOUNTER — Encounter (HOSPITAL_COMMUNITY): Payer: Self-pay | Admitting: Emergency Medicine

## 2012-10-21 VITALS — BP 143/102 | HR 79 | Temp 98.3°F | Resp 18 | Wt 193.8 lb

## 2012-10-21 DIAGNOSIS — I2510 Atherosclerotic heart disease of native coronary artery without angina pectoris: Secondary | ICD-10-CM

## 2012-10-21 DIAGNOSIS — R079 Chest pain, unspecified: Principal | ICD-10-CM | POA: Diagnosis present

## 2012-10-21 DIAGNOSIS — E785 Hyperlipidemia, unspecified: Secondary | ICD-10-CM

## 2012-10-21 DIAGNOSIS — F339 Major depressive disorder, recurrent, unspecified: Secondary | ICD-10-CM

## 2012-10-21 DIAGNOSIS — R202 Paresthesia of skin: Secondary | ICD-10-CM

## 2012-10-21 DIAGNOSIS — F17200 Nicotine dependence, unspecified, uncomplicated: Secondary | ICD-10-CM | POA: Diagnosis present

## 2012-10-21 DIAGNOSIS — IMO0002 Reserved for concepts with insufficient information to code with codable children: Secondary | ICD-10-CM

## 2012-10-21 DIAGNOSIS — R0789 Other chest pain: Secondary | ICD-10-CM

## 2012-10-21 DIAGNOSIS — M542 Cervicalgia: Secondary | ICD-10-CM

## 2012-10-21 DIAGNOSIS — I10 Essential (primary) hypertension: Secondary | ICD-10-CM | POA: Diagnosis present

## 2012-10-21 DIAGNOSIS — I079 Rheumatic tricuspid valve disease, unspecified: Secondary | ICD-10-CM | POA: Diagnosis present

## 2012-10-21 DIAGNOSIS — I498 Other specified cardiac arrhythmias: Secondary | ICD-10-CM | POA: Diagnosis present

## 2012-10-21 DIAGNOSIS — I5033 Acute on chronic diastolic (congestive) heart failure: Secondary | ICD-10-CM | POA: Diagnosis present

## 2012-10-21 DIAGNOSIS — I252 Old myocardial infarction: Secondary | ICD-10-CM

## 2012-10-21 DIAGNOSIS — M25512 Pain in left shoulder: Secondary | ICD-10-CM

## 2012-10-21 DIAGNOSIS — R05 Cough: Secondary | ICD-10-CM

## 2012-10-21 DIAGNOSIS — IMO0001 Reserved for inherently not codable concepts without codable children: Secondary | ICD-10-CM | POA: Diagnosis present

## 2012-10-21 DIAGNOSIS — M25049 Hemarthrosis, unspecified hand: Secondary | ICD-10-CM | POA: Diagnosis present

## 2012-10-21 DIAGNOSIS — I5032 Chronic diastolic (congestive) heart failure: Secondary | ICD-10-CM | POA: Diagnosis present

## 2012-10-21 DIAGNOSIS — Z8673 Personal history of transient ischemic attack (TIA), and cerebral infarction without residual deficits: Secondary | ICD-10-CM | POA: Diagnosis present

## 2012-10-21 DIAGNOSIS — Z7982 Long term (current) use of aspirin: Secondary | ICD-10-CM

## 2012-10-21 DIAGNOSIS — Z8249 Family history of ischemic heart disease and other diseases of the circulatory system: Secondary | ICD-10-CM

## 2012-10-21 DIAGNOSIS — I5030 Unspecified diastolic (congestive) heart failure: Secondary | ICD-10-CM

## 2012-10-21 DIAGNOSIS — F1410 Cocaine abuse, uncomplicated: Secondary | ICD-10-CM | POA: Diagnosis present

## 2012-10-21 DIAGNOSIS — I639 Cerebral infarction, unspecified: Secondary | ICD-10-CM

## 2012-10-21 DIAGNOSIS — I509 Heart failure, unspecified: Secondary | ICD-10-CM | POA: Diagnosis present

## 2012-10-21 LAB — CBC
Hemoglobin: 11.9 g/dL — ABNORMAL LOW (ref 12.0–15.0)
MCH: 27.2 pg (ref 26.0–34.0)
Platelets: 187 10*3/uL (ref 150–400)
RBC: 4.37 MIL/uL (ref 3.87–5.11)
WBC: 6.1 10*3/uL (ref 4.0–10.5)

## 2012-10-21 LAB — RAPID URINE DRUG SCREEN, HOSP PERFORMED
Amphetamines: NOT DETECTED
Barbiturates: NOT DETECTED
Benzodiazepines: NOT DETECTED
Cocaine: NOT DETECTED

## 2012-10-21 LAB — URINALYSIS, ROUTINE W REFLEX MICROSCOPIC
Bilirubin Urine: NEGATIVE
Hgb urine dipstick: NEGATIVE
Ketones, ur: NEGATIVE mg/dL
Nitrite: NEGATIVE
Specific Gravity, Urine: 1.016 (ref 1.005–1.030)
Urobilinogen, UA: 1 mg/dL (ref 0.0–1.0)

## 2012-10-21 LAB — COMPREHENSIVE METABOLIC PANEL
ALT: 15 U/L (ref 0–35)
AST: 20 U/L (ref 0–37)
Alkaline Phosphatase: 71 U/L (ref 39–117)
CO2: 25 mEq/L (ref 19–32)
Calcium: 9.6 mg/dL (ref 8.4–10.5)
Chloride: 102 mEq/L (ref 96–112)
GFR calc Af Amer: 82 mL/min — ABNORMAL LOW (ref >90)
GFR calc non Af Amer: 71 mL/min — ABNORMAL LOW (ref >90)
Glucose, Bld: 92 mg/dL (ref 70–99)
Potassium: 4.1 mEq/L (ref 3.5–5.1)
Sodium: 135 mEq/L (ref 135–145)
Total Bilirubin: 0.3 mg/dL (ref 0.3–1.2)

## 2012-10-21 LAB — POCT I-STAT TROPONIN I: Troponin i, poc: 0 ng/mL (ref 0.00–0.08)

## 2012-10-21 LAB — URINE MICROSCOPIC-ADD ON

## 2012-10-21 MED ORDER — ADULT MULTIVITAMIN W/MINERALS CH
1.0000 | ORAL_TABLET | Freq: Every day | ORAL | Status: DC
  Administered 2012-10-22 – 2012-10-24 (×3): 1 via ORAL
  Filled 2012-10-21 (×3): qty 1

## 2012-10-21 MED ORDER — CITALOPRAM HYDROBROMIDE 40 MG PO TABS
40.0000 mg | ORAL_TABLET | Freq: Every day | ORAL | Status: DC
  Administered 2012-10-22 – 2012-10-24 (×3): 40 mg via ORAL
  Filled 2012-10-21 (×3): qty 1

## 2012-10-21 MED ORDER — NITROGLYCERIN 0.4 MG SL SUBL
0.4000 mg | SUBLINGUAL_TABLET | SUBLINGUAL | Status: DC | PRN
Start: 2012-10-21 — End: 2012-10-24

## 2012-10-21 MED ORDER — ASPIRIN EC 81 MG PO TBEC
81.0000 mg | DELAYED_RELEASE_TABLET | Freq: Every day | ORAL | Status: DC
  Administered 2012-10-22 – 2012-10-24 (×3): 81 mg via ORAL
  Filled 2012-10-21 (×3): qty 1

## 2012-10-21 MED ORDER — SODIUM CHLORIDE 0.9 % IV SOLN: 250.0000 mL | INTRAVENOUS | Status: DC | PRN

## 2012-10-21 MED ORDER — ATORVASTATIN CALCIUM 40 MG PO TABS
40.0000 mg | ORAL_TABLET | Freq: Every day | ORAL | Status: DC
  Administered 2012-10-22 – 2012-10-24 (×3): 40 mg via ORAL
  Filled 2012-10-21 (×4): qty 1

## 2012-10-21 MED ORDER — SODIUM CHLORIDE 0.9 % IJ SOLN: 3.0000 mL | Freq: Two times a day (BID) | INTRAMUSCULAR | Status: DC

## 2012-10-21 MED ORDER — NITROGLYCERIN 2 % TD OINT
1.0000 [in_us] | TOPICAL_OINTMENT | Freq: Once | TRANSDERMAL | Status: AC
  Administered 2012-10-21: 1 [in_us] via TOPICAL
  Filled 2012-10-21: qty 1

## 2012-10-21 MED ORDER — NITROGLYCERIN 0.4 MG SL SUBL
0.4000 mg | SUBLINGUAL_TABLET | Freq: Once | SUBLINGUAL | Status: AC
  Administered 2012-10-21: 0.4 mg via SUBLINGUAL

## 2012-10-21 MED ORDER — TRAZODONE HCL 100 MG PO TABS
100.0000 mg | ORAL_TABLET | Freq: Every day | ORAL | Status: DC
  Administered 2012-10-21 – 2012-10-23 (×3): 100 mg via ORAL
  Filled 2012-10-21 (×4): qty 1

## 2012-10-21 MED ORDER — HYDROXYZINE HCL 25 MG PO TABS
25.0000 mg | ORAL_TABLET | Freq: Every evening | ORAL | Status: DC | PRN
  Filled 2012-10-21 (×2): qty 1

## 2012-10-21 MED ORDER — HEPARIN SODIUM (PORCINE) 5000 UNIT/ML IJ SOLN
5000.0000 [IU] | Freq: Three times a day (TID) | INTRAMUSCULAR | Status: DC
  Administered 2012-10-21 – 2012-10-24 (×9): 5000 [IU] via SUBCUTANEOUS
  Filled 2012-10-21 (×11): qty 1

## 2012-10-21 MED ORDER — POLYETHYLENE GLYCOL 3350 17 GM/SCOOP PO POWD
17.0000 g | Freq: Every day | ORAL | Status: DC | PRN
  Filled 2012-10-21: qty 255

## 2012-10-21 MED ORDER — SODIUM CHLORIDE 0.9 % IJ SOLN: 3.0000 mL | INTRAMUSCULAR | Status: DC | PRN

## 2012-10-21 MED ORDER — POTASSIUM CHLORIDE CRYS ER 10 MEQ PO TBCR
10.0000 meq | EXTENDED_RELEASE_TABLET | Freq: Two times a day (BID) | ORAL | Status: DC
  Administered 2012-10-22 – 2012-10-24 (×6): 10 meq via ORAL
  Filled 2012-10-21 (×7): qty 1

## 2012-10-21 MED ORDER — SODIUM CHLORIDE 0.9 % IV BOLUS (SEPSIS)
1000.0000 mL | Freq: Once | INTRAVENOUS | Status: AC
  Administered 2012-10-21: 1000 mL via INTRAVENOUS

## 2012-10-21 MED ORDER — ASPIRIN 81 MG PO CHEW
324.0000 mg | CHEWABLE_TABLET | Freq: Once | ORAL | Status: AC
  Administered 2012-10-21: 324 mg via ORAL
  Filled 2012-10-21: qty 4

## 2012-10-21 MED ORDER — ALBUTEROL SULFATE HFA 108 (90 BASE) MCG/ACT IN AERS
2.0000 | INHALATION_SPRAY | RESPIRATORY_TRACT | Status: DC | PRN
  Filled 2012-10-21: qty 6.7

## 2012-10-21 MED ORDER — FUROSEMIDE 20 MG PO TABS
20.0000 mg | ORAL_TABLET | Freq: Two times a day (BID) | ORAL | Status: DC
  Administered 2012-10-21 – 2012-10-24 (×7): 20 mg via ORAL
  Filled 2012-10-21 (×10): qty 1

## 2012-10-21 MED ORDER — MORPHINE SULFATE 2 MG/ML IJ SOLN
2.0000 mg | INTRAMUSCULAR | Status: DC | PRN
  Administered 2012-10-21 – 2012-10-23 (×4): 2 mg via INTRAVENOUS
  Filled 2012-10-21 (×4): qty 1

## 2012-10-21 MED ORDER — SODIUM CHLORIDE 0.9 % IJ SOLN
3.0000 mL | Freq: Two times a day (BID) | INTRAMUSCULAR | Status: DC
  Administered 2012-10-21 – 2012-10-24 (×6): 3 mL via INTRAVENOUS

## 2012-10-21 MED ORDER — LORATADINE 10 MG PO TABS
10.0000 mg | ORAL_TABLET | Freq: Every day | ORAL | Status: DC
Start: 2012-10-22 — End: 2012-10-24
  Administered 2012-10-22 – 2012-10-24 (×3): 10 mg via ORAL
  Filled 2012-10-21 (×3): qty 1

## 2012-10-21 MED ORDER — MORPHINE SULFATE 4 MG/ML IJ SOLN
4.0000 mg | Freq: Once | INTRAMUSCULAR | Status: AC
  Administered 2012-10-21: 4 mg via INTRAVENOUS
  Filled 2012-10-21: qty 1

## 2012-10-21 MED ORDER — CLONIDINE HCL 0.3 MG PO TABS
0.3000 mg | ORAL_TABLET | Freq: Two times a day (BID) | ORAL | Status: DC
  Administered 2012-10-21 – 2012-10-24 (×6): 0.3 mg via ORAL
  Filled 2012-10-21 (×7): qty 1

## 2012-10-21 MED ORDER — NICOTINE 21 MG/24HR TD PT24
21.0000 mg | MEDICATED_PATCH | Freq: Every day | TRANSDERMAL | Status: DC
  Administered 2012-10-22 – 2012-10-24 (×3): 21 mg via TRANSDERMAL
  Filled 2012-10-21 (×3): qty 1

## 2012-10-21 MED ORDER — NICOTINE 21 MG/24HR TD PT24: 21.0000 mg | MEDICATED_PATCH | Freq: Every day | TRANSDERMAL | Status: DC

## 2012-10-21 MED ORDER — ONDANSETRON HCL 4 MG/2ML IJ SOLN
4.0000 mg | Freq: Once | INTRAMUSCULAR | Status: AC
  Administered 2012-10-21: 4 mg via INTRAVENOUS
  Filled 2012-10-21: qty 2

## 2012-10-21 MED ORDER — FERROUS SULFATE 325 (65 FE) MG PO TABS
325.0000 mg | ORAL_TABLET | Freq: Two times a day (BID) | ORAL | Status: DC
  Administered 2012-10-22 – 2012-10-24 (×6): 325 mg via ORAL
  Filled 2012-10-21 (×7): qty 1

## 2012-10-21 NOTE — Progress Notes (Signed)
Gave report to [facility] at [facility] ED patient transferred via [facility].

## 2012-10-21 NOTE — ED Notes (Signed)
[facility] with c/o left sided chest pain, onset 4 days.  Pt went to [facility] to get check the reason for chest pain and [facility] center sent her here for further evaluation. Pt denies any other symptoms. Per EMS EKG unremarkable, BP-140/113, SpO2-100%, pulse-69

## 2012-10-21 NOTE — ED Notes (Signed)
Admitting provider at bedside.

## 2012-10-21 NOTE — Progress Notes (Signed)
Patient ID: Morgan Bean, female   DOB: [birth year], 47 y.o.   MRN: [id number]  CC: left arm pain   HPI: Pt is reporting 3 days of chest pain and left arm  Pain that is radiating down left arm and into hands and fingers. She is reporting that she is having no SOB.  She has HTN and CAD and cerebrovascular disease.  She is still smoking but reports that she is cutting down on cigarettes.  She says that pain in 8/10.  It is not reproducible with palpation but says it is on the inside of her chest.  She says that the pain is on the left side of anterior chest wall.   Allergies  Allergen Reactions  . Ciprofloxacin Itching   Past Medical History  Diagnosis Date  . Coronary artery disease     Lexiscan myoview (10/12) with significant ST depression upon Lexiscan injection but no ischemia or infarction on perfusion images. Left heart cath (10/12): 30% mid LAD, 30% ostial D1, 50% ostial D2.    . Stroke   . Hypertension   . Tricuspid regurgitation   . Iron deficiency     hx of  . Polysubstance abuse     Prior cocaine  . Tobacco abuse   . Leukocytoclastic vasculitis     Rash across lower body, occurred in 9/11, diagnosed by skin biopsy. ANA positive. Thought to be secondary to cocaine use.  . Pulmonary HTN     Echo (9/11) with EF 65%, mild LVH, mild AI, mild MR, moderate to possibly severe TR with PA systolic pressure 52 mmHg. Echo (10/12): severe LV hypertrophy, EF 60-65%, mild MR, mild AI, moderate to severe tricuspid regurgitation, PA systolic pressure 38 mmHg.  Echo 4/14: moderate LVH, EF 65%, normal wall motion, diastolic dysfunction, mild AI, mild MR  . Asthma   . Shortness of breath     a. PFTs 5/14:  FEV1/FVC 99% predicted; FVC 60% predicted; DLCO mildly reduced, mild restriction, air trapping.;  b.  seen by pulmo (Dr. Shelle Iron) 5/14: mainly upper airway symptoms - ACE d/c'd (sx's better)  . Chronic diastolic CHF (congestive heart failure)    Current Outpatient Prescriptions on File Prior to  Visit  Medication Sig Dispense Refill  . albuterol (PROVENTIL HFA;VENTOLIN HFA) 108 (90 BASE) MCG/ACT inhaler Inhale 2 puffs into the lungs every 4 (four) hours as needed for wheezing or shortness of breath.  6.7 g  0  . aspirin 81 MG chewable tablet Chew 1 tablet (81 mg total) by mouth daily.  10 tablet  0  . citalopram (CELEXA) 40 MG tablet Take 1 tablet (40 mg total) by mouth daily. For depression and anxiety.  30 tablet  0  . cloNIDine (CATAPRES) 0.3 MG tablet Take 1 tablet (0.3 mg total) by mouth 2 (two) times daily in the am and at bedtime.. For blood pressure control.  60 tablet  11  . ferrous sulfate 325 (65 FE) MG tablet Take 1 tablet (325 mg total) by mouth 2 (two) times daily with a meal. For anemia.  60 tablet  3  . furosemide (LASIX) 20 MG tablet Take 1 tablet (20 mg total) by mouth 2 (two) times daily.  60 tablet  11  . loratadine (CLARITIN) 10 MG tablet Take 1 tablet (10 mg total) by mouth daily. For seasonal allergies.  30 tablet  3  . Multiple Vitamin (MULTIVITAMIN WITH MINERALS) TABS Take 1 tablet by mouth daily. For nutritional supplementation.  30 tablet  0  .  polyethylene glycol powder (GLYCOLAX/MIRALAX) powder Take 17 g by mouth daily as needed. Constipation      . potassium chloride (K-DUR,KLOR-CON) 10 MEQ tablet Take 1 tablet (10 mEq total) by mouth 2 (two) times daily with a meal.  60 tablet  11  . pravastatin (PRAVACHOL) 40 MG tablet Take 1 tablet (40 mg total) by mouth every evening.  30 tablet  6  . traZODone (DESYREL) 100 MG tablet Take 1 tablet (100 mg total) by mouth at bedtime. For sleep.  30 tablet  1   No current facility-administered medications on file prior to visit.   Family History  Problem Relation Age of Onset  . Coronary artery disease Mother   . Diabetes Mother   . Breast cancer Maternal Grandmother   . Diabetes Maternal Grandmother   . Diabetes Father   . Hypertension Father   . Coronary artery disease Father    History   Social History  .  Marital Status: Single    Spouse Name: N/A    Number of Children: 2  . Years of Education: N/A   Occupational History  . unemployed    Social History Main Topics  . Smoking status: Current Every Day Smoker -- 0.50 packs/day for 20 years    Types: Cigarettes  . Smokeless tobacco: Never Used     Comment: 5 cigs --- uses E-cig  . Alcohol Use: Yes     Comment: pint of wine 2-3 times a week--ocassional  . Drug Use: 1.00 per week    Special: "Crack" cocaine     Comment: trying to quit---- quit November 2013   . Sexually Active: Not on file   Other Topics Concern  . Not on file   Social History Narrative   Lives with mom, sisters and brothers.    Review of Systems  Constitutional: Negative for fever, chills, diaphoresis, activity change, appetite change and fatigue.  HENT: Negative for ear pain, nosebleeds, congestion, facial swelling, rhinorrhea, neck pain, neck stiffness and ear discharge.   Eyes: Negative for pain, discharge, redness, itching and visual disturbance.  Respiratory: Negative for cough, choking, chest tightness, shortness of breath, wheezing and stridor.   Cardiovascular: Negative for chest pain, palpitations and leg swelling.  Gastrointestinal: Negative for abdominal distention.  Genitourinary: Negative for dysuria, urgency, frequency, hematuria, flank pain, decreased urine volume, difficulty urinating and dyspareunia.  Musculoskeletal: Negative for back pain, joint swelling, arthralgias and gait problem.  Neurological: Negative for dizziness, tremors, seizures, syncope, facial asymmetry, speech difficulty, weakness, light-headedness, numbness and headaches.  Hematological: Negative for adenopathy. Does not bruise/bleed easily.  Psychiatric/Behavioral: Negative for hallucinations, behavioral problems, confusion, dysphoric mood, decreased concentration and agitation.    Objective:   Filed Vitals:   10/21/12 1214  BP: 143/102  Pulse: 79  Temp: 98.3 F (36.8 C)   Resp: 18    Physical Exam  Constitutional: Appears well-developed and well-nourished. No distress.  HENT: Normocephalic. External right and left ear normal. Oropharynx is clear and moist.  Eyes: Conjunctivae and EOM are normal. PERRLA, no scleral icterus.  Neck: Normal ROM. Neck supple. No JVD. No tracheal deviation. No thyromegaly.  CVS: RRR, S1/S2 +, no murmurs, no gallops, no carotid bruit.  Pulmonary: Effort and breath sounds normal, no stridor, rhonchi, wheezes, rales.  Abdominal: Soft. BS +,  no distension, tenderness, rebound or guarding.  Musculoskeletal: Normal range of motion. No edema and no tenderness.  Lymphadenopathy: No lymphadenopathy noted, cervical, inguinal. Neuro: Alert. Normal reflexes, muscle tone coordination. No cranial nerve  deficit. Skin: Skin is warm and dry. No rash noted. Not diaphoretic. No erythema. No pallor.  Psychiatric: Normal mood and affect. Behavior, judgment, thought content normal.   Lab Results  Component Value Date   WBC 4.2* 07/13/2012   HGB 12.7 07/13/2012   HCT 38.3 07/13/2012   MCV 82.3 07/13/2012   PLT 193.0 07/13/2012   Lab Results  Component Value Date   CREATININE 1.1 09/05/2012   BUN 16 09/05/2012   NA 136 09/05/2012   K 4.4 09/05/2012   CL 105 09/05/2012   CO2 25 09/05/2012    Lab Results  Component Value Date   HGBA1C 5.5 04/06/2012   Lipid Panel     Component Value Date/Time   CHOL 162 04/07/2012 0221   TRIG 100 04/07/2012 0221   HDL 45 04/07/2012 0221   CHOLHDL 3.6 04/07/2012 0221   VLDL 20 04/07/2012 0221   LDLCALC 97 04/07/2012 0221       Assessment and plan:   Patient Active Problem List   Diagnosis Date Noted  . Fibroid uterus 09/14/2012  . Perimenopause 09/14/2012  . Chronic cough 08/12/2012  . DOE (dyspnea on exertion) 08/12/2012  . CVA (cerebral infarction) 08/09/2012  . Chest pain, mid sternal 04/07/2012  . Pulmonary hypertension 04/06/2012  . Polysubstance abuse 04/06/2012  . Acute bronchitis 04/06/2012  .  Cocaine abuse 11/16/2011  . Major depressive disorder, recurrent episode with psychotic features. 11/15/2011    Class: Acute  . Abscess and cellulitis 08/09/2011  . Chronic diastolic heart failure 02/24/2011  . CAD (coronary artery disease) 02/24/2011  . Hyperlipidemia 01/06/2011  . Hypertension 12/15/2010  . Chest pain 12/15/2010  . Exertional dyspnea 12/15/2010  . Smoking 12/15/2010   Check EKG stat  Pt is being sent to ER by EMS for evaluation and management of chest pain  The patient was counseled on the dangers of tobacco use, and was advised to quit.  Reviewed strategies to maximize success, including removing cigarettes and smoking materials from environment and stress management.  RTC in 2 weeks  Rodney Langton, MD, CDE, FAAFP [facility] [place], [place]

## 2012-10-21 NOTE — ED Notes (Signed)
Report attempted x2

## 2012-10-21 NOTE — Patient Instructions (Addendum)
Go to ER now for further evaluation and management   Chest Pain (Nonspecific) It is often hard to give a specific diagnosis for the cause of chest pain. There is always a chance that your pain could be related to something serious, such as a heart attack or a blood clot in the lungs. You need to follow up with your caregiver for further evaluation. CAUSES   Heartburn.  Pneumonia or bronchitis.  Anxiety or stress.  Inflammation around your heart (pericarditis) or lung (pleuritis or pleurisy).  A blood clot in the lung.  A collapsed lung (pneumothorax). It can develop suddenly on its own (spontaneous pneumothorax) or from injury (trauma) to the chest.  Shingles infection (herpes zoster virus). The chest wall is composed of bones, muscles, and cartilage. Any of these can be the source of the pain.  The bones can be bruised by injury.  The muscles or cartilage can be strained by coughing or overwork.  The cartilage can be affected by inflammation and become sore (costochondritis). DIAGNOSIS  Lab tests or other studies, such as X-rays, electrocardiography, stress testing, or cardiac imaging, may be needed to find the cause of your pain.  TREATMENT   Treatment depends on what may be causing your chest pain. Treatment may include:  Acid blockers for heartburn.  Anti-inflammatory medicine.  Pain medicine for inflammatory conditions.  Antibiotics if an infection is present.  You may be advised to change lifestyle habits. This includes stopping smoking and avoiding alcohol, caffeine, and chocolate.  You may be advised to keep your head raised (elevated) when sleeping. This reduces the chance of acid going backward from your stomach into your esophagus.  Most of the time, nonspecific chest pain will improve within 2 to 3 days with rest and mild pain medicine. HOME CARE INSTRUCTIONS   If antibiotics were prescribed, take your antibiotics as directed. Finish them even if you start  to feel better.  For the next few days, avoid physical activities that bring on chest pain. Continue physical activities as directed.  Do not smoke.  Avoid drinking alcohol.  Only take over-the-counter or prescription medicine for pain, discomfort, or fever as directed by your caregiver.  Follow your caregiver's suggestions for further testing if your chest pain does not go away.  Keep any follow-up appointments you made. If you do not go to an appointment, you could develop lasting (chronic) problems with pain. If there is any problem keeping an appointment, you must call to reschedule. SEEK MEDICAL CARE IF:   You think you are having problems from the medicine you are taking. Read your medicine instructions carefully.  Your chest pain does not go away, even after treatment.  You develop a rash with blisters on your chest. SEEK IMMEDIATE MEDICAL CARE IF:   You have increased chest pain or pain that spreads to your arm, neck, jaw, back, or abdomen.  You develop shortness of breath, an increasing cough, or you are coughing up blood.  You have severe back or abdominal pain, feel nauseous, or vomit.  You develop severe weakness, fainting, or chills.  You have a fever. THIS IS AN EMERGENCY. Do not wait to see if the pain will go away. Get medical help at once. Call your local emergency services (911 in [place] Do not drive yourself to the hospital. MAKE SURE YOU:   Understand these instructions.  Will watch your condition.  Will get help right away if you are not doing well or get worse. Document Released: 12/17/2004  Document Revised: 06/01/2011 Document Reviewed: 10/13/2007 [facility] Patient Information 2014 [place], [place].

## 2012-10-21 NOTE — ED Notes (Signed)
Report attempted x 1

## 2012-10-21 NOTE — Progress Notes (Signed)
Patient states not sure if she slept wrong Complains of pain to left shoulder that radiates down her Left arm for the past 3 days Denies any SOB

## 2012-10-21 NOTE — ED Notes (Signed)
Spoke with Admitting provider to change admitting order to step down.

## 2012-10-21 NOTE — ED Provider Notes (Signed)
CSN: [id number]     Arrival date & time 10/21/12  1352 History     First MD Initiated Contact with Patient 10/21/12 1506     Chief Complaint  Patient presents with  . Chest Pain   (Consider location/radiation/quality/duration/timing/severity/associated sxs/prior Treatment) HPI  47 year old female with a past medical history significant for coronary artery disease, hypertension, stroke, polysubstance abuse, vasculitis, diastolic heart failure previous cardiac catheterization.  Patient reports history of personal MI.  Patient is occurring every day smoker.  She has family history of MI.  She is nondiabetic.  Patient presents today with chief complaint of left neck shoulder and arm pain with associated chest pain.  She states she woke with neck and shoulder pain on Monday of this past week 4 days ago.  She had associated shooting pain down the left arm.  She states that the shooting pain is intermittent however the neck and shoulder pain is constant she is associated chest pain but denies any dyspnea, nausea, vomiting or diaphoresis.  Patient states that sitting upright with heating pad makes her pain better.  She has tried Tylenol and Advil without relief of her symptoms.  She has an associated paresthesia in the left hand.  This is also intermittent. Denies fevers, chills, myalgias, arthralgias. Denies DOE, SOB, chest tightness or pressure. Denies dysuria, flank pain, suprapubic pain, frequency, urgency, or hematuria. Denies headaches, light headedness, weakness, visual disturbances. Denies abdominal pain, nausea, vomiting, diarrhea or constipation.   Past Medical History  Diagnosis Date  . Coronary artery disease     Lexiscan myoview (10/12) with significant ST depression upon Lexiscan injection but no ischemia or infarction on perfusion images. Left heart cath (10/12): 30% mid LAD, 30% ostial D1, 50% ostial D2.    . Stroke   . Hypertension   . Tricuspid regurgitation   . Iron deficiency      hx of  . Polysubstance abuse     Prior cocaine  . Tobacco abuse   . Leukocytoclastic vasculitis     Rash across lower body, occurred in 9/11, diagnosed by skin biopsy. ANA positive. Thought to be secondary to cocaine use.  . Pulmonary HTN     Echo (9/11) with EF 65%, mild LVH, mild AI, mild MR, moderate to possibly severe TR with PA systolic pressure 52 mmHg. Echo (10/12): severe LV hypertrophy, EF 60-65%, mild MR, mild AI, moderate to severe tricuspid regurgitation, PA systolic pressure 38 mmHg.  Echo 4/14: moderate LVH, EF 65%, normal wall motion, diastolic dysfunction, mild AI, mild MR  . Asthma   . Shortness of breath     a. PFTs 5/14:  FEV1/FVC 99% predicted; FVC 60% predicted; DLCO mildly reduced, mild restriction, air trapping.;  b.  seen by pulmo (Dr. Shelle Iron) 5/14: mainly upper airway symptoms - ACE d/c'd (sx's better)  . Chronic diastolic CHF (congestive heart failure)    Past Surgical History  Procedure Laterality Date  . Ankle surgery      right  . Cardiac catheterization    . I&d extremity  08/09/2011    Procedure: IRRIGATION AND DEBRIDEMENT EXTREMITY;  Surgeon: Robyne Askew, MD;  Location: [facility] OR;  Service: General;  Laterality: Left;  i & D Left axilla abscess   Family History  Problem Relation Age of Onset  . Coronary artery disease Mother   . Diabetes Mother   . Breast cancer Maternal Grandmother   . Diabetes Maternal Grandmother   . Diabetes Father   . Hypertension Father   .  Coronary artery disease Father    History  Substance Use Topics  . Smoking status: Current Every Day Smoker -- 0.50 packs/day for 20 years    Types: Cigarettes  . Smokeless tobacco: Never Used     Comment: 5 cigs --- uses E-cig  . Alcohol Use: Yes     Comment: pint of wine 2-3 times a week--ocassional   OB History   Grav Para Term Preterm Abortions TAB SAB Ect Mult Living   4 2 2  2 2    2      Review of Systems Ten systems reviewed and are negative for acute change, except as noted  in the HPI.   Allergies  Ciprofloxacin  Home Medications   Current Outpatient Rx  Name  Route  Sig  Dispense  Refill  . albuterol (PROVENTIL HFA;VENTOLIN HFA) 108 (90 BASE) MCG/ACT inhaler   Inhalation   Inhale 2 puffs into the lungs every 4 (four) hours as needed for wheezing or shortness of breath.         [name] aspirin 81 MG tablet   Oral   Take 81 mg by mouth daily.         . citalopram (CELEXA) 40 MG tablet   Oral   Take 40 mg by mouth daily.         . cloNIDine (CATAPRES) 0.3 MG tablet   Oral   Take 0.3 mg by mouth 2 (two) times daily.         . ferrous sulfate 325 (65 FE) MG tablet   Oral   Take 325 mg by mouth 2 (two) times daily with a meal.         . furosemide (LASIX) 20 MG tablet   Oral   Take 1 tablet (20 mg total) by mouth 2 (two) times daily.   60 tablet   11   . hydrOXYzine (ATARAX/VISTARIL) 25 MG tablet   Oral   Take 25 mg by mouth at bedtime as needed (sleep).         . loratadine (CLARITIN) 10 MG tablet   Oral   Take 10 mg by mouth daily.         . Multiple Vitamin (MULTIVITAMIN WITH MINERALS) TABS   Oral   Take 1 tablet by mouth daily.         . polyethylene glycol powder (GLYCOLAX/MIRALAX) powder   Oral   Take 17 g by mouth daily as needed (constipation).          . potassium chloride (K-DUR,KLOR-CON) 10 MEQ tablet   Oral   Take 1 tablet (10 mEq total) by mouth 2 (two) times daily with a meal.   60 tablet   11   . traZODone (DESYREL) 100 MG tablet   Oral   Take 100 mg by mouth at bedtime.          BP 153/107  Pulse 65  Temp(Src) 97.7 F (36.5 C) (Oral)  Resp 13  SpO2 100%  LMP 07/27/2012 Physical Exam  Vitals reviewed. Constitutional: She is oriented to person, place, and time. She appears well-developed and well-nourished. No distress.  HENT:  Head: Normocephalic and atraumatic.  Eyes: Conjunctivae are normal. No scleral icterus.  Neck: Normal range of motion.  Cardiovascular: Normal rate, regular rhythm  and normal heart sounds.  Exam reveals no gallop and no friction rub.   No murmur heard. Pulmonary/Chest: Effort normal and breath sounds normal. No respiratory distress.  Abdominal: Soft. Bowel sounds are normal. She  exhibits no distension and no mass. There is no tenderness. There is no guarding.  Musculoskeletal:  Pain of the neck with extension, left lateral rotation and right lateral flexion of the neck. Pain is not reproducible with palpation of the neck shoulder or chest.  She has a negative axial load test. She is neurovascularly intact.  Neurological: She is alert and oriented to person, place, and time.  Skin: Skin is warm and dry. She is not diaphoretic.    ED Course   Procedures (including critical care time)  Labs Reviewed  CBC - Abnormal; Notable for the following:    Hemoglobin 11.9 (*)    HCT 35.2 (*)    RDW 16.0 (*)    All other components within normal limits  COMPREHENSIVE METABOLIC PANEL - Abnormal; Notable for the following:    GFR calc non Af Amer 71 (*)    GFR calc Af Amer 82 (*)    All other components within normal limits  PRO B NATRIURETIC PEPTIDE - Abnormal; Notable for the following:    Pro B Natriuretic peptide (BNP) 315.7 (*)    All other components within normal limits  URINALYSIS, ROUTINE W REFLEX MICROSCOPIC  URINE RAPID DRUG SCREEN (HOSP PERFORMED)  POCT I-STAT TROPONIN I   Dg Chest 2 View  10/21/2012   *RADIOLOGY REPORT*  Clinical Data: The chest pain  CHEST - 2 VIEW  Comparison: 07/13/2012  Findings: Heart size appears normal.  No pleural effusion or edema identified.  There is no airspace consolidation.  The visualized osseous structures are unremarkable.  IMPRESSION:  1.  No acute cardiopulmonary abnormalities.   Original Report Authenticated By: Signa Kell, M.D.   No diagnosis found.    Date: 10/21/2012  Rate: 65  Rhythm: normal sinus rhythm  QRS Axis: normal  Intervals: PR prolonged  ST/T Wave abnormalities: normal and minimal  elevation anterior leads without reciprocal depression  Conduction Disutrbances:none  Narrative Interpretation:   Old EKG Reviewed: changes noted with new prolonged PR of >281ms   MDM  3:58 PM Filed Vitals:   10/21/12 1510  BP: 153/107  Pulse: 65  Temp:   Resp: 13   Patient with significant risk factors for acute coronary syndrome.  The patient states that she feels it is likely musculoskeletal in origin.  [facility] here for complaints for evaluation.  Patient has not taken aspirin today.  She states that she has only intermittent pain at this time. She is hypertensive   4:43 PM Patient c/o pain,  4mg  morphine given EKG shows new prolonged PR   5:22 PM Patient mildly hypertensive.  She states that she had an episode of hypotension was seen by her primary care reduced her hypertensive medications from were daily continue.  She states she thought it might go back up because of that.  Patient states that the morphine has helped with her pain but it is still there.  Applied 1 inch strip of 0.2% nitro paste to reduce patient's pressures. I will consult to hospitalist service for admission with serial troponins.   5:46 PM BP 162/108  Pulse 61  Temp(Src) 97.9 F (36.6 C) (Oral)  Resp 18  SpO2 100%  LMP 07/27/2012 I have spoken with the Internal medicine teaching service who will admit the patient for serial troponins.  Doubt ACS, however patient has sig. RFs that warrant inpatient obs. UDS pending.  Concern for cardiac etiology of Chest Pain. Cardiology has been  consulted and will see patient inpatient.  Pt has been re-evaluated prior to consult and VSS, NAD, heart RRR, pain 0/10, lungs CTAB. No acute abnormalities found on EKG and first round of cardiac enzymes negative. This case was discussed with Dr. Blinda Leatherwood who has seen the patient and agrees with plan to admit.    Arthor Captain, PA-C 10/22/12 1541

## 2012-10-21 NOTE — H&P (Signed)
Date: 10/21/2012               Patient Name:  Morgan Bean MRN: [id number]  DOB: June 27, 1965 Age / Sex: 47 y.o., female   PCP: No Pcp Per Patient         Medical Service: Internal Medicine Teaching Service         Attending Physician: Dr. Gilda Crease, *    First Contact: Dr. Evelena Peat Pager: [phone]  Second Contact: Dr. Lorretta Harp Pager: [phone]       After Hours (After 5p/  First Contact Pager: [phone]  weekends / holidays): Second Contact Pager: [phone]   Chief Complaint: chest pain  History of Present Illness: Ms. Signor is a 47 yo female with a PMH of CAD (MI 5 years ago, Cath in 2013), diastolic HF, tricuspid regurgitation, HTN and anemia presenting with a 3 day history of L sided CP.  She describes the pain as dull, constant, exertional, non-pleuritic and radiating into the L arm.  She denies associated N/V/SOB/diaphoresis.  She also denies H/A/fevers/chills.  She follows with Dr. Sharol Harness at [facility].  She also reports L scapula pain that began 5 days ago.  The pain is intermittent, sharp and positional.  She denies trauma history of trauma.    Meds: No current facility-administered medications for this encounter.   Current Outpatient Prescriptions  Medication Sig Dispense Refill  . albuterol (PROVENTIL HFA;VENTOLIN HFA) 108 (90 BASE) MCG/ACT inhaler Inhale 2 puffs into the lungs every 4 (four) hours as needed for wheezing or shortness of breath.      [name] aspirin 81 MG tablet Take 81 mg by mouth daily.      . citalopram (CELEXA) 40 MG tablet Take 40 mg by mouth daily.      . cloNIDine (CATAPRES) 0.3 MG tablet Take 0.3 mg by mouth 2 (two) times daily.      . ferrous sulfate 325 (65 FE) MG tablet Take 325 mg by mouth 2 (two) times daily with a meal.      . furosemide (LASIX) 20 MG tablet Take 1 tablet (20 mg total) by mouth 2 (two) times daily.  60 tablet  11  . hydrOXYzine (ATARAX/VISTARIL) 25 MG tablet Take 25 mg by mouth at bedtime as needed (sleep).      .  loratadine (CLARITIN) 10 MG tablet Take 10 mg by mouth daily.      . Multiple Vitamin (MULTIVITAMIN WITH MINERALS) TABS Take 1 tablet by mouth daily.      . polyethylene glycol powder (GLYCOLAX/MIRALAX) powder Take 17 g by mouth daily as needed (constipation).       . potassium chloride (K-DUR,KLOR-CON) 10 MEQ tablet Take 1 tablet (10 mEq total) by mouth 2 (two) times daily with a meal.  60 tablet  11  . traZODone (DESYREL) 100 MG tablet Take 100 mg by mouth at bedtime.        Allergies: Allergies as of 10/21/2012 - Review Complete 10/21/2012  Allergen Reaction Noted  . Ciprofloxacin Itching 12/15/2010   Past Medical History  Diagnosis Date  . Coronary artery disease     Lexiscan myoview (10/12) with significant ST depression upon Lexiscan injection but no ischemia or infarction on perfusion images. Left heart cath (10/12): 30% mid LAD, 30% ostial D1, 50% ostial D2.    . Stroke   . Hypertension   . Tricuspid regurgitation   . Iron deficiency     hx of  . Polysubstance abuse  Prior cocaine  . Tobacco abuse   . Leukocytoclastic vasculitis     Rash across lower body, occurred in 9/11, diagnosed by skin biopsy. ANA positive. Thought to be secondary to cocaine use.  . Pulmonary HTN     Echo (9/11) with EF 65%, mild LVH, mild AI, mild MR, moderate to possibly severe TR with PA systolic pressure 52 mmHg. Echo (10/12): severe LV hypertrophy, EF 60-65%, mild MR, mild AI, moderate to severe tricuspid regurgitation, PA systolic pressure 38 mmHg.  Echo 4/14: moderate LVH, EF 65%, normal wall motion, diastolic dysfunction, mild AI, mild MR  . Asthma   . Shortness of breath     a. PFTs 5/14:  FEV1/FVC 99% predicted; FVC 60% predicted; DLCO mildly reduced, mild restriction, air trapping.;  b.  seen by pulmo (Dr. Shelle Iron) 5/14: mainly upper airway symptoms - ACE d/c'd (sx's better)  . Chronic diastolic CHF (congestive heart failure)    Past Surgical History  Procedure Laterality Date  . Ankle  surgery      right  . Cardiac catheterization    . I&d extremity  08/09/2011    Procedure: IRRIGATION AND DEBRIDEMENT EXTREMITY;  Surgeon: Robyne Askew, MD;  Location: [facility] OR;  Service: General;  Laterality: Left;  i & D Left axilla abscess   Family History  Problem Relation Age of Onset  . Coronary artery disease Mother   . Diabetes Mother   . Breast cancer Maternal Grandmother   . Diabetes Maternal Grandmother   . Diabetes Father   . Hypertension Father   . Coronary artery disease Father    History   Social History  . Marital Status: Single    Spouse Name: N/A    Number of Children: 2  . Years of Education: N/A   Occupational History  . unemployed    Social History Main Topics  . Smoking status: Current Every Day Smoker -- 0.50 packs/day for 20 years    Types: Cigarettes  . Smokeless tobacco: Never Used     Comment: 5 cigs --- uses E-cig  . Alcohol Use: Yes     Comment: pint of wine 2-3 times a week--ocassional  . Drug Use: 1.00 per week    Special: "Crack" cocaine     Comment: trying to quit---- quit November 2013   . Sexually Active: Not on file   Other Topics Concern  . Not on file   Social History Narrative   Lives with mom, sisters and brothers.    Review of Systems: Pertinent items are noted in HPI.  Physical Exam: Blood pressure 163/105, pulse 58, temperature 97.9 F (36.6 C), temperature source Oral, resp. rate 20, last menstrual period 07/27/2012, SpO2 100.00%. General: sitting up in bed in NAD HEENT: PERRL, EOMI, dry mucous membranes Cardiac: RRR, no rubs, murmurs or gallops Pulm: clear to auscultation bilaterally, moving normal volumes of air Abd: soft, nontender, nondistended, BS present Ext: warm and well perfused, no pedal edema, +2DPs, 5/5 MMS Neuro: alert and oriented X3, cranial nerves II-XII grossly intact  Lab results: Basic Metabolic Panel:  Recent Labs  95/62/13 1435  NA 135  K 4.1  CL 102  CO2 25  GLUCOSE 92  BUN 14    CREATININE 0.93  CALCIUM 9.6   Liver Function Tests:  Recent Labs  10/21/12 1435  AST 20  ALT 15  ALKPHOS 71  BILITOT 0.3  PROT 7.5  ALBUMIN 3.6   No results found for this basename: LIPASE, AMYLASE,  in  the last 72 hours No results found for this basename: AMMONIA,  in the last 72 hours CBC:  Recent Labs  10/21/12 1435  WBC 6.1  HGB 11.9*  HCT 35.2*  MCV 80.5  PLT 187   Cardiac Enzymes: No results found for this basename: CKTOTAL, CKMB, CKMBINDEX, TROPONINI,  in the last 72 hours BNP:  Recent Labs  10/21/12 1433  PROBNP 315.7*   D-Dimer: No results found for this basename: DDIMER,  in the last 72 hours CBG: No results found for this basename: GLUCAP,  in the last 72 hours Hemoglobin A1C: No results found for this basename: HGBA1C,  in the last 72 hours Fasting Lipid Panel: No results found for this basename: CHOL, HDL, LDLCALC, TRIG, CHOLHDL, LDLDIRECT,  in the last 72 hours Thyroid Function Tests: No results found for this basename: TSH, T4TOTAL, FREET4, T3FREE, THYROIDAB,  in the last 72 hours Anemia Panel: No results found for this basename: VITAMINB12, FOLATE, FERRITIN, TIBC, IRON, RETICCTPCT,  in the last 72 hours Coagulation: No results found for this basename: LABPROT, INR,  in the last 72 hours Urine Drug Screen: Drugs of Abuse     Component Value Date/Time   LABOPIA POSITIVE* 10/21/2012 1704   COCAINSCRNUR NONE DETECTED 10/21/2012 1704   LABBENZ NONE DETECTED 10/21/2012 1704   AMPHETMU NONE DETECTED 10/21/2012 1704   THCU NONE DETECTED 10/21/2012 1704   LABBARB NONE DETECTED 10/21/2012 1704    Alcohol Level: No results found for this basename: ETH,  in the last 72 hours Urinalysis:  Recent Labs  10/21/12 1704  COLORURINE YELLOW  LABSPEC 1.016  PHURINE 7.5  GLUCOSEU NEGATIVE  HGBUR NEGATIVE  BILIRUBINUR NEGATIVE  KETONESUR NEGATIVE  PROTEINUR NEGATIVE  UROBILINOGEN 1.0  NITRITE NEGATIVE  LEUKOCYTESUR SMALL*   Imaging results:  Dg Chest  2 View  10/21/2012   *RADIOLOGY REPORT*  Clinical Data: The chest pain  CHEST - 2 VIEW  Comparison: 07/13/2012  Findings: Heart size appears normal.  No pleural effusion or edema identified.  There is no airspace consolidation.  The visualized osseous structures are unremarkable.  IMPRESSION:  1.  No acute cardiopulmonary abnormalities.   Original Report Authenticated By: Signa Kell, M.D.    Other results: EKG:  Sinus rhythm, nonspecific T-wave changes in lead II.  Assessment & Plan by Problem: 47 year old woman with a PMH of HTN, diastolic HF, HLD, CVA and smoking presents with chest pain. Initial poc troponin is negative. EKG has a nonspecific T-wave changes in lead III. ProBNP is 315. No leukocytosis. UDS is negative for cocaine, positive for opiate.   #: Chest pain: Patient has exertional chest pain. EKG has nonspecific change. She has negative workup for chest pain, including normal Myoview test and cardiac cath. However, she has multiple risk factors, including hyperlipidemia, hypertension, congestive heart failure and smoking. It is important to rule out any possibility of for ACS. The other differential diagnoses including, but less likely PE (less likely, low probability by [facility] score), PNA (no fever, cough, negative chest-Xray), aortic dissection ( less likely, not typical tearing pain, no mediastinal widening on chest X-ray). Patient UDS is negative for cocaine, putting out of cocaine induced chest pain. The another possibility is anxiety, which cannot be completely ruled out at this moment.  Plan:  - Admit to telemetry  - cycle CE q6 x3 and repeat her EKG in the am  - Nitroglycerin, Morphine, Lipitor and ASA  - will not start BB given bradycardia  - consider cardiology consult if test  positive for CEs   #: HTN: bp is 162/108. Will continue home medications, including clonidine and Lasix  #: Tobacco use disoreder: Patient has been smoking 5 cigarettes per day for more than 20  years.  -will treat with nicotine patch  -Will consult social work   #: CHF: 2-D echo on 07/19/12 showed EF 65% with diastolic dysfunction. Currently patient is on Lasix 40 mg daily. Patient is clinically dry. She does not have any leg or edema or JVD. Chest x-ray did not show any pulmonary edema. Because of her cough and SOB, his ACE inhibitor was discontinued by his cardiologist. Due to increased creatinine level, her losartan was also discontinued at that the cardiologist.  - Will give Lasix 20 mg daily (instead of 40mg ) as pt appears dry - Will continue ASA  - consider to restart ARB   #: Hx of CVA: stable. On ASA   #: HLD: LDL was 97 on 04/07/12.  Dispo: Disposition is deferred at this time, awaiting improvement of current medical problems. Anticipated discharge in approximately 2-3 day(s).   The patient does not have a current PCP (No Pcp Per Patient) and does need an [facility] hospital follow-up appointment after discharge.  The patient does not know have transportation limitations that hinder transportation to clinic appointments.  Signed: Evelena Peat, DO 10/21/2012, 6:28 PM

## 2012-10-22 DIAGNOSIS — M25519 Pain in unspecified shoulder: Secondary | ICD-10-CM

## 2012-10-22 LAB — BASIC METABOLIC PANEL
CO2: 24 mEq/L (ref 19–32)
GFR calc non Af Amer: 67 mL/min — ABNORMAL LOW (ref >90)
Glucose, Bld: 90 mg/dL (ref 70–99)
Potassium: 4 mEq/L (ref 3.5–5.1)
Sodium: 133 mEq/L — ABNORMAL LOW (ref 135–145)

## 2012-10-22 LAB — MRSA PCR SCREENING: MRSA by PCR: NEGATIVE

## 2012-10-22 MED ORDER — AMLODIPINE BESYLATE 5 MG PO TABS
5.0000 mg | ORAL_TABLET | Freq: Every day | ORAL | Status: DC
  Administered 2012-10-22: 5 mg via ORAL
  Filled 2012-10-22 (×2): qty 1

## 2012-10-22 MED ORDER — IBUPROFEN 400 MG PO TABS
400.0000 mg | ORAL_TABLET | Freq: Four times a day (QID) | ORAL | Status: DC | PRN
  Administered 2012-10-23 (×2): 400 mg via ORAL
  Filled 2012-10-22 (×3): qty 1

## 2012-10-22 MED ORDER — METHOCARBAMOL 500 MG PO TABS
500.0000 mg | ORAL_TABLET | Freq: Three times a day (TID) | ORAL | Status: DC
  Administered 2012-10-22 – 2012-10-24 (×7): 500 mg via ORAL
  Filled 2012-10-22 (×8): qty 1

## 2012-10-22 NOTE — Progress Notes (Signed)
Paged Dr. Clyde Lundborg regarding pt having unrelieved CP.  Pt rates pain at 7/10 at her left chest, as well as pain in her back.  Pt states that she was on Norvasc at one point per her Cards MD for her CP, and then a few weeks ago was taken off of it d/t hypotension.  Pt believes that this is why she is having CP.  Relayed this infor to Dr. Clyde Lundborg.  Norvasc 5mg  ordered, as well as pt transfer to tele was d/c.  Will continue to monitor.  Salomon Mast, RN

## 2012-10-22 NOTE — Progress Notes (Signed)
Pt having unresolved CP despite 2mg  morphine IV.  2L O2 Vega placed on pt.  2/10 (down from 3-4/10).  L chest.  Dr. Andrey Campanile paged and notified.  No new orders received. Will continue to assess.  Salomon Mast, RN

## 2012-10-22 NOTE — Progress Notes (Signed)
Subjective: [name] was seen and examined by the team this AM.  She says she was pain-free last night and slept well.  She has CP this AM that is decreased to 5/10.  Her L shoulder is still painful.  She denies SOB, H/A, dysuria, weakness.    Objective: Vital signs in last 24 hours: Filed Vitals:   10/22/12 0728 10/22/12 0800 10/22/12 0934 10/22/12 1200  BP:  133/91 144/96 135/98  Pulse: 60 59  56  Temp:  97.5 F (36.4 C)  98 F (36.7 C)  TempSrc:  Oral  Oral  Resp: 13 15  15   Height:      Weight:      SpO2: 100% 100%  100%   Weight change:   Intake/Output Summary (Last 24 hours) at 10/22/12 1516 Last data filed at 10/22/12 1300  Gross per 24 hour  Intake    543 ml  Output      0 ml  Net    543 ml   General: resting in bed in NAD Cardiac: RRR, no rubs, murmurs or gallops Pulm: clear to auscultation bilaterally, moving normal volumes of air Abd: soft, nontender, nondistended MSK:  Spine non-tender, L shoulder TTP Neuro: alert and oriented X3  Lab Results: Basic Metabolic Panel:  Recent Labs Lab 10/21/12 1435  NA 135  K 4.1  CL 102  CO2 25  GLUCOSE 92  BUN 14  CREATININE 0.93  CALCIUM 9.6   Liver Function Tests:  Recent Labs Lab 10/21/12 1435  AST 20  ALT 15  ALKPHOS 71  BILITOT 0.3  PROT 7.5  ALBUMIN 3.6   No results found for this basename: LIPASE, AMYLASE,  in the last 168 hours No results found for this basename: AMMONIA,  in the last 168 hours CBC:  Recent Labs Lab 10/21/12 1435  WBC 6.1  HGB 11.9*  HCT 35.2*  MCV 80.5  PLT 187   Cardiac Enzymes:  Recent Labs Lab 10/21/12 2227 10/22/12 0340 10/22/12 1034  TROPONINI <0.30 <0.30 <0.30   BNP:  Recent Labs Lab 10/21/12 1433  PROBNP 315.7*   D-Dimer: No results found for this basename: DDIMER,  in the last 168 hours CBG: No results found for this basename: GLUCAP,  in the last 168 hours Hemoglobin A1C: No results found for this basename: HGBA1C,  in the last 168  hours Fasting Lipid Panel: No results found for this basename: CHOL, HDL, LDLCALC, TRIG, CHOLHDL, LDLDIRECT,  in the last 168 hours Thyroid Function Tests: No results found for this basename: TSH, T4TOTAL, FREET4, T3FREE, THYROIDAB,  in the last 168 hours Coagulation:  Recent Labs Lab 10/22/12 0340  LABPROT 14.3  INR 1.13   Anemia Panel: No results found for this basename: VITAMINB12, FOLATE, FERRITIN, TIBC, IRON, RETICCTPCT,  in the last 168 hours Urine Drug Screen: Drugs of Abuse     Component Value Date/Time   LABOPIA POSITIVE* 10/21/2012 1704   COCAINSCRNUR NONE DETECTED 10/21/2012 1704   LABBENZ NONE DETECTED 10/21/2012 1704   AMPHETMU NONE DETECTED 10/21/2012 1704   THCU NONE DETECTED 10/21/2012 1704   LABBARB NONE DETECTED 10/21/2012 1704    Alcohol Level: No results found for this basename: ETH,  in the last 168 hours Urinalysis:  Recent Labs Lab 10/21/12 1704  COLORURINE YELLOW  LABSPEC 1.016  PHURINE 7.5  GLUCOSEU NEGATIVE  HGBUR NEGATIVE  BILIRUBINUR NEGATIVE  KETONESUR NEGATIVE  PROTEINUR NEGATIVE  UROBILINOGEN 1.0  NITRITE NEGATIVE  LEUKOCYTESUR SMALL*    Micro Results:  Recent Results (from the past 240 hour(s))  MRSA PCR SCREENING     Status: None   Collection Time    10/21/12 10:13 PM      Result Value Range Status   MRSA by PCR NEGATIVE  NEGATIVE Final   Comment:            The GeneXpert MRSA Assay (FDA     approved for NASAL specimens     only), is one component of a     comprehensive MRSA colonization     surveillance program. It is not     intended to diagnose MRSA     infection nor to guide or     monitor treatment for     MRSA infections.   Studies/Results: Dg Chest 2 View  10/21/2012   *RADIOLOGY REPORT*  Clinical Data: The chest pain  CHEST - 2 VIEW  Comparison: 07/13/2012  Findings: Heart size appears normal.  No pleural effusion or edema identified.  There is no airspace consolidation.  The visualized osseous structures are unremarkable.   IMPRESSION:  1.  No acute cardiopulmonary abnormalities.   Original Report Authenticated By: Signa Kell, M.D.   Medications: I have reviewed the patient's current medications. Scheduled Meds: . aspirin EC  81 mg Oral Daily  . atorvastatin  40 mg Oral q1800  . citalopram  40 mg Oral Daily  . cloNIDine  0.3 mg Oral BID  . ferrous sulfate  325 mg Oral BID WC  . furosemide  20 mg Oral BID  . heparin  5,000 Units Subcutaneous Q8H  . loratadine  10 mg Oral Daily  . multivitamin with minerals  1 tablet Oral Daily  . nicotine  21 mg Transdermal Daily  . potassium chloride  10 mEq Oral BID WC  . sodium chloride  3 mL Intravenous Q12H  . traZODone  100 mg Oral QHS   Continuous Infusions:  PRN Meds:.sodium chloride, albuterol, hydrOXYzine, morphine injection, nitroGLYCERIN, polyethylene glycol powder, sodium chloride  Assessment/Plan: 47 year old woman with a PMH of HTN, diastolic HF, HLD, CVA and smoking presents with chest pain. Initial poc troponin negative. EKG has a nonspecific T-wave changes in lead III. ProBNP is 315. No leukocytosis. UDS is negative for cocaine, positive for opiate.   #: Chest pain: Trop neg x3, repeat EKG sinus bradycardia with non-specific t wave abnormalities.  She was asymptomatic last night but has CP this AM.  She says it is now 5/10 and responds to pain meds. - transfer to telemetry - morning EKG - Nitroglycerin, Morphine, Lipitor and ASA  - will not start BB given bradycardia   #:L Shoulder pain - pt with L shoulder pain x 6 days.   L shoulder TTP on exam.  Pt without abdominal symptoms. - flexeril and ibuprofen  #: HTN: Will continue home medications, including clonidine and Lasix   #: Tobacco use disorder: Patient has been smoking 5 cigarettes per day for more than 20 years.  -will treat with nicotine patch   #: CHF: 2-D echo on 07/19/12 showed EF 65% with diastolic dysfunction. Currently patient is on Lasix 40 mg daily but appeared dry on admission  -  Will continue Lasix 20 mg daily (instead of 40mg ) as pt appears dry  - Will continue ASA  - consider to restart ARB   #: Hx of CVA: stable. On ASA   #: HLD: LDL was 97 on 04/07/12.  Dispo: Disposition is deferred at this time, awaiting improvement of current medical problems.  Anticipated discharge in approximately 1-2 day(s).   The patient does not have a current PCP (No Pcp Per Patient) and does need an [facility] hospital follow-up appointment after discharge.  The patient does not know have transportation limitations that hinder transportation to clinic appointments.  .Services Needed at time of discharge: Y = Yes, Blank = No PT:   OT:   RN:   Equipment:   Other:     LOS: 1 day   Evelena Peat, DO 10/22/2012, 3:16 PM

## 2012-10-22 NOTE — ED Provider Notes (Signed)
Medical screening examination/treatment/procedure(s) were performed by non-physician practitioner and as supervising physician I was immediately available for consultation/collaboration.   Christopher J. Pollina, MD 10/22/12 1756 

## 2012-10-22 NOTE — H&P (Signed)
I have seen, examined and discussed the treatment plan with Dr. Andrey Campanile and agree with her plan.  Duke Salvia Drue Second MD MPH [facility] [phone]

## 2012-10-23 LAB — CBC WITH DIFFERENTIAL/PLATELET
Eosinophils Absolute: 0.1 10*3/uL (ref 0.0–0.7)
Hemoglobin: 11.9 g/dL — ABNORMAL LOW (ref 12.0–15.0)
Lymphocytes Relative: 47 % — ABNORMAL HIGH (ref 12–46)
Lymphs Abs: 2.3 10*3/uL (ref 0.7–4.0)
MCH: 27.4 pg (ref 26.0–34.0)
MCV: 81.6 fL (ref 78.0–100.0)
Monocytes Relative: 15 % — ABNORMAL HIGH (ref 3–12)
Neutrophils Relative %: 35 % — ABNORMAL LOW (ref 43–77)
RBC: 4.35 MIL/uL (ref 3.87–5.11)
WBC: 4.9 10*3/uL (ref 4.0–10.5)

## 2012-10-23 MED ORDER — AMLODIPINE BESYLATE 10 MG PO TABS
10.0000 mg | ORAL_TABLET | Freq: Every day | ORAL | Status: DC
  Administered 2012-10-23 – 2012-10-24 (×2): 10 mg via ORAL
  Filled 2012-10-23 (×2): qty 1

## 2012-10-23 NOTE — Progress Notes (Signed)
Pt arrived as transfer in w/c with RN. Oriented to room. Weighed and VS done. Placed on Telemetry monitor.

## 2012-10-23 NOTE — Progress Notes (Signed)
Report called to Junius Roads, RN on 4E.  Pt transferred via w/c, tele.    Salomon Mast, RN

## 2012-10-23 NOTE — Progress Notes (Signed)
Subjective:  - She feels much better. Her chest pain and shoulder pain both improved significantly. Currently chest pain 6/10 on 8/2-->3/10 in severity today.  -She denies SOB, H/A, dysuria, weakness.    Objective: Vital signs in last 24 hours: Filed Vitals:   10/23/12 0947 10/23/12 1123 10/23/12 1600 10/23/12 1900  BP: 165/112 160/91 120/88 150/94  Pulse:   54 66  Temp:  97.6 F (36.4 C) 97.7 F (36.5 C) 98.2 F (36.8 C)  TempSrc:  Oral Oral Oral  Resp:    20  Height:    5\' 6"  (1.676 m)  Weight:    214 lb 8.1 oz (97.3 kg)  SpO2:   100% 100%   Weight change: 1 lb 1.6 oz (0.5 kg)  Intake/Output Summary (Last 24 hours) at 10/23/12 2019 Last data filed at 10/23/12 1300  Gross per 24 hour  Intake    480 ml  Output      0 ml  Net    480 ml   General: resting in bed in NAD Cardiac: RRR, no rubs, murmurs or gallops Pulm: clear to auscultation bilaterally, moving normal volumes of air Abd: soft, nontender, nondistended MSK:  Spine non-tender, L shoulder TTP Neuro: alert and oriented X3  Lab Results: Basic Metabolic Panel:  Recent Labs Lab 10/21/12 1435 10/22/12 1753  NA 135 133*  K 4.1 4.0  CL 102 101  CO2 25 24  GLUCOSE 92 90  BUN 14 16  CREATININE 0.93 0.97  CALCIUM 9.6 9.0   Liver Function Tests:  Recent Labs Lab 10/21/12 1435  AST 20  ALT 15  ALKPHOS 71  BILITOT 0.3  PROT 7.5  ALBUMIN 3.6   No results found for this basename: LIPASE, AMYLASE,  in the last 168 hours No results found for this basename: AMMONIA,  in the last 168 hours CBC:  Recent Labs Lab 10/21/12 1435 10/23/12 0555  WBC 6.1 4.9  NEUTROABS  --  1.7  HGB 11.9* 11.9*  HCT 35.2* 35.5*  MCV 80.5 81.6  PLT 187 179   Cardiac Enzymes:  Recent Labs Lab 10/21/12 2227 10/22/12 0340 10/22/12 1034  TROPONINI <0.30 <0.30 <0.30   BNP:  Recent Labs Lab 10/21/12 1433  PROBNP 315.7*   D-Dimer: No results found for this basename: DDIMER,  in the last 168 hours CBG: No  results found for this basename: GLUCAP,  in the last 168 hours Hemoglobin A1C: No results found for this basename: HGBA1C,  in the last 168 hours Fasting Lipid Panel: No results found for this basename: CHOL, HDL, LDLCALC, TRIG, CHOLHDL, LDLDIRECT,  in the last 168 hours Thyroid Function Tests: No results found for this basename: TSH, T4TOTAL, FREET4, T3FREE, THYROIDAB,  in the last 168 hours Coagulation:  Recent Labs Lab 10/22/12 0340  LABPROT 14.3  INR 1.13   Anemia Panel: No results found for this basename: VITAMINB12, FOLATE, FERRITIN, TIBC, IRON, RETICCTPCT,  in the last 168 hours Urine Drug Screen: Drugs of Abuse     Component Value Date/Time   LABOPIA POSITIVE* 10/21/2012 1704   COCAINSCRNUR NONE DETECTED 10/21/2012 1704   LABBENZ NONE DETECTED 10/21/2012 1704   AMPHETMU NONE DETECTED 10/21/2012 1704   THCU NONE DETECTED 10/21/2012 1704   LABBARB NONE DETECTED 10/21/2012 1704    Alcohol Level: No results found for this basename: ETH,  in the last 168 hours Urinalysis:  Recent Labs Lab 10/21/12 1704  COLORURINE YELLOW  LABSPEC 1.016  PHURINE 7.5  GLUCOSEU NEGATIVE  HGBUR NEGATIVE  BILIRUBINUR NEGATIVE  KETONESUR NEGATIVE  PROTEINUR NEGATIVE  UROBILINOGEN 1.0  NITRITE NEGATIVE  LEUKOCYTESUR SMALL*    Micro Results: Recent Results (from the past 240 hour(s))  MRSA PCR SCREENING     Status: None   Collection Time    10/21/12 10:13 PM      Result Value Range Status   MRSA by PCR NEGATIVE  NEGATIVE Final   Comment:            The GeneXpert MRSA Assay (FDA     approved for NASAL specimens     only), is one component of a     comprehensive MRSA colonization     surveillance program. It is not     intended to diagnose MRSA     infection nor to guide or     monitor treatment for     MRSA infections.   Studies/Results: No results found. Medications: I have reviewed the patient's current medications. Scheduled Meds: . amLODipine  10 mg Oral Daily  . aspirin EC   81 mg Oral Daily  . atorvastatin  40 mg Oral q1800  . citalopram  40 mg Oral Daily  . cloNIDine  0.3 mg Oral BID  . ferrous sulfate  325 mg Oral BID WC  . furosemide  20 mg Oral BID  . heparin  5,000 Units Subcutaneous Q8H  . loratadine  10 mg Oral Daily  . methocarbamol  500 mg Oral TID  . multivitamin with minerals  1 tablet Oral Daily  . nicotine  21 mg Transdermal Daily  . potassium chloride  10 mEq Oral BID WC  . sodium chloride  3 mL Intravenous Q12H  . traZODone  100 mg Oral QHS   Continuous Infusions:  PRN Meds:.sodium chloride, albuterol, hydrOXYzine, ibuprofen, morphine injection, nitroGLYCERIN, polyethylene glycol powder, sodium chloride  Assessment/Plan: 47 year old woman with a PMH of HTN, diastolic HF, HLD, CVA and smoking presents with chest pain. Initial poc troponin negative. EKG has a nonspecific T-wave changes in lead III. ProBNP is 315. No leukocytosis. UDS is negative for cocaine, positive for opiate.   #: Chest pain: Trop neg x3, repeat EKG sinus bradycardia with non-specific t wave abnormalities. Patient reported that her chest pain responded to amlodipine in the past, but it was discontinued due to her bp was running low. amlodipine was added back on 8/2. Her chest pain improved significantly today.   - transfer to telemetry - morning EKG - Nitroglycerin, Morphine, Lipitor, Amlodipin and ASA  - will not start BB given bradycardia   #:L Shoulder pain - improved significantly.  - Robaxin and ibuprofen  #: HTN: Will continue home medications, including clonidine and Lasix   #: Tobacco use disorder: Patient has been smoking 5 cigarettes per day for more than 20 years.  -will treat with nicotine patch   #: CHF: 2-D echo on 07/19/12 showed EF 65% with diastolic dysfunction. Currently patient is on Lasix 40 mg daily but appeared dry on admission  - Will continue Lasix 20 mg daily (instead of 40mg ) as pt appears dry  - Will continue ASA  - consider to restart ARB    #: Hx of CVA: stable. On ASA   #: HLD: LDL was 97 on 04/07/12.  Dispo: Disposition is deferred at this time, awaiting improvement of current medical problems.  Anticipated discharge in approximately 1-2 day(s).   The patient does not have a current PCP (No Pcp Per Patient) and does need an [facility] hospital follow-up appointment after discharge.  The patient does not know have transportation limitations that hinder transportation to clinic appointments.  .Services Needed at time of discharge: Y = Yes, Blank = No PT:   OT:   RN:   Equipment:   Other:     LOS: 2 days   Lorretta Harp, MD 10/23/2012, 8:19 PM

## 2012-10-23 NOTE — Progress Notes (Signed)
Made Dr. Clyde Lundborg aware that pt is still c/o CP 3/10.  Also told him that pt takes 10mg  norvasc at home.  Norvasc dose increased to 10mg  daily.  MD says ok to transfer.  Salomon Mast, RN

## 2012-10-24 DIAGNOSIS — E785 Hyperlipidemia, unspecified: Secondary | ICD-10-CM

## 2012-10-24 DIAGNOSIS — I6350 Cerebral infarction due to unspecified occlusion or stenosis of unspecified cerebral artery: Secondary | ICD-10-CM

## 2012-10-24 DIAGNOSIS — I2510 Atherosclerotic heart disease of native coronary artery without angina pectoris: Secondary | ICD-10-CM

## 2012-10-24 LAB — BASIC METABOLIC PANEL
BUN: 16 mg/dL (ref 6–23)
CO2: 25 mEq/L (ref 19–32)
GFR calc non Af Amer: 69 mL/min — ABNORMAL LOW (ref >90)
Glucose, Bld: 114 mg/dL — ABNORMAL HIGH (ref 70–99)
Potassium: 3.7 mEq/L (ref 3.5–5.1)

## 2012-10-24 MED ORDER — METHOCARBAMOL 500 MG PO TABS: 500.0000 mg | ORAL_TABLET | Freq: Three times a day (TID) | ORAL | Status: DC

## 2012-10-24 MED ORDER — AMLODIPINE BESYLATE 10 MG PO TABS: 10.0000 mg | ORAL_TABLET | Freq: Every day | ORAL | Status: DC

## 2012-10-24 MED ORDER — ATORVASTATIN CALCIUM 40 MG PO TABS: 40.0000 mg | ORAL_TABLET | Freq: Every day | ORAL | Status: DC

## 2012-10-24 NOTE — Progress Notes (Signed)
1640 discharge instructions and prescriptions given to pt . Verbalized understanding . Awaiting for family member for transport home

## 2012-10-24 NOTE — Discharge Summary (Signed)
Patient Name:  Morgan Bean  MRN: [id number]  PCP: No PCP Per Patient  DOB:  [birth year]       Date of Admission:  10/21/2012  Date of Discharge:  10/24/2012      Attending Physician: Dr. Judyann Munson, MD        DISCHARGE DIAGNOSES: 1. Principal Problem: 2.   Chest pain 3. Active Problems: 4.   Hypertension 5.   Smoking 6.   Hyperlipidemia 7.   Chronic diastolic heart failure 8.   CAD (coronary artery disease) 9.   CVA (cerebral infarction) 10.    DISPOSITION AND FOLLOW-UP: Morgan Bean is to follow-up with the listed providers as detailed below, at patient's visiting, please address following issues:  1. Tobacco cessation   Discharge Orders   Future Appointments Provider Department Dept Phone   10/31/2012 2:15 PM [facility] Covering Provider [facility] [phone]   01/02/2013 9:50 AM Beatrice Lecher, PA-C  [facility] [place]) [phone]   Future Orders Complete By Expires     (HEART FAILURE PATIENTS) Call MD:  Anytime you have any of the following symptoms: 1) 3 pound weight gain in 24 hours or 5 pounds in 1 week 2) shortness of breath, with or without a dry hacking cough 3) swelling in the hands, feet or stomach 4) if you have to sleep on extra pillows at night in order to breathe.  As directed     Call MD for:  difficulty breathing, headache or visual disturbances  As directed     Call MD for:  persistant dizziness or light-headedness  As directed     Call MD for:  severe uncontrolled pain  As directed     Diet - low sodium heart healthy  As directed     Increase activity slowly  As directed         DISCHARGE MEDICATIONS:   Medication List         albuterol 108 (90 BASE) MCG/ACT inhaler  Commonly known as:  PROVENTIL HFA;VENTOLIN HFA  Inhale 2 puffs into the lungs every 4 (four) hours as needed for wheezing or shortness of breath.     amLODipine 10 MG tablet  Commonly known as:  NORVASC  Take 1 tablet (10 mg  total) by mouth daily.     aspirin 81 MG tablet  Take 81 mg by mouth daily.     atorvastatin 40 MG tablet  Commonly known as:  LIPITOR  Take 1 tablet (40 mg total) by mouth daily at 6 PM.     citalopram 40 MG tablet  Commonly known as:  CELEXA  Take 40 mg by mouth daily.     cloNIDine 0.3 MG tablet  Commonly known as:  CATAPRES  Take 0.3 mg by mouth 2 (two) times daily.     ferrous sulfate 325 (65 FE) MG tablet  Take 325 mg by mouth 2 (two) times daily with a meal.     furosemide 20 MG tablet  Commonly known as:  LASIX  Take 1 tablet (20 mg total) by mouth 2 (two) times daily.     hydrOXYzine 25 MG tablet  Commonly known as:  ATARAX/VISTARIL  Take 25 mg by mouth at bedtime as needed (sleep).     loratadine 10 MG tablet  Commonly known as:  CLARITIN  Take 10 mg by mouth daily.     methocarbamol 500 MG tablet  Commonly known as:  ROBAXIN  Take 1 tablet (500  mg total) by mouth 3 (three) times daily.     multivitamin with minerals Tabs tablet  Take 1 tablet by mouth daily.     polyethylene glycol powder powder  Commonly known as:  GLYCOLAX/MIRALAX  Take 17 g by mouth daily as needed (constipation).     potassium chloride 10 MEQ tablet  Commonly known as:  K-DUR,KLOR-CON  Take 1 tablet (10 mEq total) by mouth 2 (two) times daily with a meal.     traZODone 100 MG tablet  Commonly known as:  DESYREL  Take 100 mg by mouth at bedtime.        CONSULTS:   None   PROCEDURES PERFORMED:  Dg Chest 2 View  10/21/2012   *RADIOLOGY REPORT*  Clinical Data: The chest pain  CHEST - 2 VIEW  Comparison: 07/13/2012  Findings: Heart size appears normal.  No pleural effusion or edema identified.  There is no airspace consolidation.  The visualized osseous structures are unremarkable.  IMPRESSION:  1.  No acute cardiopulmonary abnormalities.   Original Report Authenticated By: Signa Kell, M.D.      ADMISSION DATA: H&P:  Morgan Bean is a 47 yo female with a PMH of CAD (MI 5 years  ago, Cath in 2013), diastolic HF, tricuspid regurgitation, HTN and anemia presenting with a 3 day history of L sided CP. She describes the pain as dull, constant, exertional, non-pleuritic and radiating into the L arm. She denies associated N/V/SOB/diaphoresis. She also denies H/A/fevers/chills. She follows with [facility] Sharol Har[facility] at [facility]. She also reports L scapula pain that began 5 days ago. The pain is intermittent, sharp and positional. She denies history of trauma.   Physical Exam: Blood pressure 163/105, pulse 58, temperature 97.9 F (36.6 C), temperature source Oral, resp. rate 20, last menstrual period 07/27/2012, SpO2 100.00%.  General: sitting up in bed in NAD  HEENT: PERRL, EOMI, dry mucous membranes  Cardiac: RRR, no rubs, murmurs or gallops  Pulm: clear to auscultation bilaterally, moving normal volumes of air  Abd: soft, nontender, nondistended, BS present  Ext: warm and well perfused, no pedal edema, +2DPs, 5/5 MMS  Neuro: alert and oriented X3, cranial nerves II-XII grossly intact  Labs: Basic Metabolic Panel:   Recent Labs   10/21/12 1435   NA  135   K  4.1   CL  102   CO2  25   GLUCOSE  92   BUN  14   CREATININE  0.93   CALCIUM  9.6    Liver Function Tests:   Recent Labs   10/21/12 1435   AST  20   ALT  15   ALKPHOS  71   BILITOT  0.3   PROT  7.5   ALBUMIN  3.6    CBC:   Recent Labs   10/21/12 1435   WBC  6.1   HGB  11.9*   HCT  35.2*   MCV  80.5   PLT  187    Cardiac Enzymes: Trop negative x 3  BNP:  Recent Labs   10/21/12 1433   PROBNP  315.7*    Urine Drug Screen:  Drugs of Abuse    Component  Value  Date/Time    LABOPIA  POSITIVE*  10/21/2012 1704    COCAINSCRNUR  NONE DETECTED  10/21/2012 1704    LABBENZ  NONE DETECTED  10/21/2012 1704    AMPHETMU  NONE DETECTED  10/21/2012 1704    THCU  NONE DETECTED  10/21/2012 1704  LABBARB  NONE DETECTED  10/21/2012 1704    Urinalysis:   Recent Labs   10/21/12 1704   COLORURINE  YELLOW    LABSPEC  1.016   PHURINE  7.5   GLUCOSEU  NEGATIVE   HGBUR  NEGATIVE   BILIRUBINUR  NEGATIVE   KETONESUR  NEGATIVE   PROTEINUR  NEGATIVE   UROBILINOGEN  1.0   NITRITE  NEGATIVE   LEUKOCYTESUR  SMALL    HOSPITAL COURSE: Morgan Bean was admitted on 10/21/12 with 3 day history of L sided CP.  Her initial POC troponin was negative and she had no evidence of ischemia on EKG.  In the ED her pain was relieved with morphine, ASA and nitro.  Given her significant risk factors for ACS she was admitted for further work-up.  By hospital day 2 she was feeling better with improved CP but continued shoulder pain.  Robaxin and ibuprofen were added for likely musculoskeletal pain.  Amlodipine was also added on hospital day 2 as she reported this had helped her CP in the past.  Her pain was significantly reduced by hospital day 3 and she was ready for discharge on hospital day 4.  She was provided with prescriptions for Robaxin, atorvastatin and amlodipine upon discharge and an appointment was scheduled for follow-up at [facility] and [facility].   DISCHARGE DATA: Vital Signs: BP 129/89  Pulse 66  Temp(Src) 97.7 F (36.5 C) (Oral)  Resp 18  Ht 5\' 6"  (1.676 m)  Wt 86.909 kg (191 lb 9.6 oz)  BMI 30.94 kg/m2  SpO2 100%  LMP 07/27/2012  Labs: Results for orders placed during the hospital encounter of 10/21/12 (from the past 24 hour(s))  BASIC METABOLIC PANEL     Status: Abnormal   Collection Time    10/24/12  4:00 AM      Result Value Range   Sodium 136  135 - 145 mEq/L   Potassium 3.7  3.5 - 5.1 mEq/L   Chloride 103  96 - 112 mEq/L   CO2 25  19 - 32 mEq/L   Glucose, Bld 114 (*) 70 - 99 mg/dL   BUN 16  6 - 23 mg/dL   Creatinine, Ser 1.61  0.50 - 1.10 mg/dL   Calcium 9.0  8.4 - 09.6 mg/dL   GFR calc non Af Amer 69 (*) >90 mL/min   GFR calc Af Amer 80 (*) >90 mL/min     Services Ordered on Discharge: Y = Yes; Blank = No PT:   OT:   RN:   Equipment:   Other:      Time Spent  on Discharge: 35 min   Signed: Evelena Peat PGY 1, Internal Medicine Resident 10/24/2012, 3:12 PM

## 2012-10-24 NOTE — Progress Notes (Signed)
  Date: 10/24/2012  Patient name: Morgan Bean  Medical record number: [id number]  Date of birth: March 23, 1966   This patient has been seen and the plan of care was discussed with the house staff. Please see their note for complete details. I concur with their findings with the following additions/corrections:  Agree with assessment and discharge plan for Ms. Cerino. She is much improved in the treatment of her musculoskeletal pain.  Judyann Munson, MD 10/24/2012, 3:54 PM

## 2012-10-24 NOTE — Progress Notes (Signed)
Subjective: Morgan Bean was seen and examined by me this AM.  She is feeling better and is ready to go home.    Objective: Vital signs in last 24 hours: Filed Vitals:   10/23/12 2036 10/24/12 0113 10/24/12 0541 10/24/12 0900  BP: 149/88 136/98 142/88 140/102  Pulse: 71 58 59 66  Temp: 97.9 F (36.6 C) 97.7 F (36.5 C) 97.7 F (36.5 C)   TempSrc: Oral Oral Oral   Resp: 18 18 18    Height:      Weight:   86.909 kg (191 lb 9.6 oz)   SpO2: 97% 100% 100%    Weight change: 7.3 kg (16 lb 1.5 oz)  Intake/Output Summary (Last 24 hours) at 10/24/12 1410 Last data filed at 10/24/12 0957  Gross per 24 hour  Intake    600 ml  Output    550 ml  Net     50 ml   General: resting in bed in NAD Cardiac: RRR, no rubs, murmurs or gallops Pulm: clear to auscultation bilaterally, moving normal volumes of air Abd: soft, nontender, nondistended, BS present Ext: warm and well perfused, no pedal edema, +2 DPs Neuro: alert and oriented X3  Lab Results: Basic Metabolic Panel:  Recent Labs Lab 10/22/12 1753 10/24/12 0400  NA 133* 136  K 4.0 3.7  CL 101 103  CO2 24 25  GLUCOSE 90 114*  BUN 16 16  CREATININE 0.97 0.95  CALCIUM 9.0 9.0   Liver Function Tests:  Recent Labs Lab 10/21/12 1435  AST 20  ALT 15  ALKPHOS 71  BILITOT 0.3  PROT 7.5  ALBUMIN 3.6   No results found for this basename: LIPASE, AMYLASE,  in the last 168 hours No results found for this basename: AMMONIA,  in the last 168 hours CBC:  Recent Labs Lab 10/21/12 1435 10/23/12 0555  WBC 6.1 4.9  NEUTROABS  --  1.7  HGB 11.9* 11.9*  HCT 35.2* 35.5*  MCV 80.5 81.6  PLT 187 179   Cardiac Enzymes:  Recent Labs Lab 10/21/12 2227 10/22/12 0340 10/22/12 1034  TROPONINI <0.30 <0.30 <0.30   BNP:  Recent Labs Lab 10/21/12 1433  PROBNP 315.7*   D-Dimer: No results found for this basename: DDIMER,  in the last 168 hours CBG: No results found for this basename: GLUCAP,  in the last 168  hours Hemoglobin A1C: No results found for this basename: HGBA1C,  in the last 168 hours Fasting Lipid Panel: No results found for this basename: CHOL, HDL, LDLCALC, TRIG, CHOLHDL, LDLDIRECT,  in the last 168 hours Thyroid Function Tests: No results found for this basename: TSH, T4TOTAL, FREET4, T3FREE, THYROIDAB,  in the last 168 hours Coagulation:  Recent Labs Lab 10/22/12 0340  LABPROT 14.3  INR 1.13   Anemia Panel: No results found for this basename: VITAMINB12, FOLATE, FERRITIN, TIBC, IRON, RETICCTPCT,  in the last 168 hours Urine Drug Screen: Drugs of Abuse     Component Value Date/Time   LABOPIA POSITIVE* 10/21/2012 1704   COCAINSCRNUR NONE DETECTED 10/21/2012 1704   LABBENZ NONE DETECTED 10/21/2012 1704   AMPHETMU NONE DETECTED 10/21/2012 1704   THCU NONE DETECTED 10/21/2012 1704   LABBARB NONE DETECTED 10/21/2012 1704    Alcohol Level: No results found for this basename: ETH,  in the last 168 hours Urinalysis:  Recent Labs Lab 10/21/12 1704  COLORURINE YELLOW  LABSPEC 1.016  PHURINE 7.5  GLUCOSEU NEGATIVE  HGBUR NEGATIVE  BILIRUBINUR NEGATIVE  KETONESUR NEGATIVE  PROTEINUR NEGATIVE  UROBILINOGEN 1.0  NITRITE NEGATIVE  LEUKOCYTESUR SMALL*    Micro Results: Recent Results (from the past 240 hour(s))  MRSA PCR SCREENING     Status: None   Collection Time    10/21/12 10:13 PM      Result Value Range Status   MRSA by PCR NEGATIVE  NEGATIVE Final   Comment:            The GeneXpert MRSA Assay (FDA     approved for NASAL specimens     only), is one component of a     comprehensive MRSA colonization     surveillance program. It is not     intended to diagnose MRSA     infection nor to guide or     monitor treatment for     MRSA infections.   Studies/Results: No results found. Medications: I have reviewed the patient's current medications. Scheduled Meds: . amLODipine  10 mg Oral Daily  . aspirin EC  81 mg Oral Daily  . atorvastatin  40 mg Oral q1800  .  citalopram  40 mg Oral Daily  . cloNIDine  0.3 mg Oral BID  . ferrous sulfate  325 mg Oral BID WC  . furosemide  20 mg Oral BID  . heparin  5,000 Units Subcutaneous Q8H  . loratadine  10 mg Oral Daily  . methocarbamol  500 mg Oral TID  . multivitamin with minerals  1 tablet Oral Daily  . nicotine  21 mg Transdermal Daily  . potassium chloride  10 mEq Oral BID WC  . sodium chloride  3 mL Intravenous Q12H  . traZODone  100 mg Oral QHS   Continuous Infusions:  PRN Meds:.sodium chloride, albuterol, hydrOXYzine, ibuprofen, morphine injection, nitroGLYCERIN, polyethylene glycol powder, sodium chloride Assessment/Plan: 47 year old woman with a PMH of HTN, diastolic HF, HLD, CVA and smoking presents with chest pain. Initial poc troponin negative. EKG has a nonspecific T-wave changes in lead III. ProBNP is 315. No leukocytosis. UDS is negative for cocaine, positive for opiate.   #: Chest pain:  She continues to improve and her CP appears to have been of musculoskeletal etiology as she has been trop neg x3 and EKG sinus bradycardia with non-specific t wave abnormalities - d/c with Robaxin and ibuprofen and home meds for CAD prevention and HTN - pt to follow up with her PCP within 1 week of discharge  #:L Shoulder pain - L shoulder pain improved. - d/c with Robaxin and ibuprofen   #: HTN: Will continue home medications, including clonidine and Lasix   #: Tobacco use disorder: Patient has been smoking 5 cigarettes per day for more than 20 years.  -will treat with nicotine patch   #: CHF: 2-D echo on 07/19/12 showed EF 65% with diastolic dysfunction. Currently patient is on Lasix 40 mg daily but appeared dry on admission  - Will continue Lasix 20 mg daily (instead of 40mg ) as pt appears dry  - Will continue ASA    #: Hx of CVA: stable. On ASA   #: HLD: LDL was 97 on 04/07/12.   Dispo: Disposition is deferred at this time, awaiting improvement of current medical problems.  Anticipated  discharge in approximately 0 day(s).   The patient does have a current PCP (No Pcp Per Patient) and does need an [facility] hospital follow-up appointment after discharge.  The patient does not have transportation limitations that hinder transportation to clinic appointments.  .Services Needed at time of discharge: Y = Yes, Blank =  No PT:   OT:   RN:   Equipment:   Other:     LOS: 3 days   Evelena Peat, DO 10/24/2012, 2:10 PM

## 2012-10-26 NOTE — Discharge Summary (Signed)
  Date: 10/26/2012  Patient name: Morgan Bean  Medical record number: [id number]  Date of birth: 05-Sep-1965   This patient has been seen and the plan of care was discussed with the house staff. Please see their note for complete details. I concur with their findings with the following additions/corrections:  I agree with plan for discharge as outlined by Dr. Orlin Hilding, MD 10/26/2012, 3:37 PM

## 2012-10-31 ENCOUNTER — Encounter: Payer: Self-pay | Admitting: Internal Medicine

## 2012-10-31 ENCOUNTER — Ambulatory Visit: Payer: No Typology Code available for payment source | Attending: Family Medicine | Admitting: Internal Medicine

## 2012-10-31 VITALS — BP 132/89 | HR 85 | Temp 98.6°F | Resp 16 | Ht 66.0 in | Wt 194.6 lb

## 2012-10-31 DIAGNOSIS — R0789 Other chest pain: Secondary | ICD-10-CM

## 2012-10-31 DIAGNOSIS — I2510 Atherosclerotic heart disease of native coronary artery without angina pectoris: Secondary | ICD-10-CM

## 2012-10-31 DIAGNOSIS — I10 Essential (primary) hypertension: Secondary | ICD-10-CM | POA: Insufficient documentation

## 2012-10-31 DIAGNOSIS — R072 Precordial pain: Secondary | ICD-10-CM

## 2012-10-31 DIAGNOSIS — I5032 Chronic diastolic (congestive) heart failure: Secondary | ICD-10-CM | POA: Insufficient documentation

## 2012-10-31 DIAGNOSIS — R079 Chest pain, unspecified: Secondary | ICD-10-CM | POA: Insufficient documentation

## 2012-10-31 MED ORDER — FERROUS SULFATE 325 (65 FE) MG PO TABS: 325.0000 mg | ORAL_TABLET | Freq: Two times a day (BID) | ORAL | Status: DC

## 2012-10-31 MED ORDER — AMLODIPINE BESYLATE 10 MG PO TABS: 10.0000 mg | ORAL_TABLET | Freq: Every day | ORAL | Status: DC

## 2012-10-31 MED ORDER — LORATADINE 10 MG PO TABS: 10.0000 mg | ORAL_TABLET | Freq: Every day | ORAL | Status: DC

## 2012-10-31 NOTE — Progress Notes (Signed)
Pt here for HFU- chest pain. Hospitalized 8/1-8/5. States she is improving. The pain is subsiding. Taking prescribed meds daily. vss

## 2012-10-31 NOTE — Progress Notes (Signed)
Patient Demographics  Morgan Bean, is a 47 y.o. female  JWJ:[id number]  [id number]:[id number]  DOB - [birth year]  Chief Complaint  Patient presents with  . Establish Care    hospital f/u chest pain        Subjective:   Morgan Bean today is here for a follow up visit. She was recently admitted to the hospital for evaluation of chest pain. Review of that hospitalization notes, suggested that the pain was musculoskeletal. She currently denies any further chest pain, and claims she is in "excellent" health. Today she denies any complaints, and her only issue is that she needs refills on amlodipine, ferrous sulfate and loratadine.  Patient has No headache, No chest pain, No abdominal pain - No Nausea, No new weakness tingling or numbness, No Cough - SOB.   Objective:    Filed Vitals:   10/31/12 1414  BP: 132/89  Pulse: 85  Temp: 98.6 F (37 C)  TempSrc: Oral  Resp: 16  Height: 5\' 6"  (1.676 m)  Weight: 194 lb 9.6 oz (88.27 kg)  SpO2: 98%     ALLERGIES:   Allergies  Allergen Reactions  . Ciprofloxacin Itching    PAST MEDICAL HISTORY: Past Medical History  Diagnosis Date  . Coronary artery disease     Lexiscan myoview (10/12) with significant ST depression upon Lexiscan injection but no ischemia or infarction on perfusion images. Left heart cath (10/12): 30% mid LAD, 30% ostial D1, 50% ostial D2.    . Stroke   . Hypertension   . Tricuspid regurgitation   . Iron deficiency     hx of  . Polysubstance abuse     Prior cocaine  . Tobacco abuse   . Leukocytoclastic vasculitis     Rash across lower body, occurred in 9/11, diagnosed by skin biopsy. ANA positive. Thought to be secondary to cocaine use.  . Pulmonary HTN     Echo (9/11) with EF 65%, mild LVH, mild AI, mild MR, moderate to possibly severe TR with PA systolic pressure 52 mmHg. Echo (10/12): severe LV hypertrophy, EF 60-65%, mild MR, mild AI, moderate to severe tricuspid regurgitation, PA systolic pressure 38 mmHg.   Echo 4/14: moderate LVH, EF 65%, normal wall motion, diastolic dysfunction, mild AI, mild MR  . Asthma   . Shortness of breath     a. PFTs 5/14:  FEV1/FVC 99% predicted; FVC 60% predicted; DLCO mildly reduced, mild restriction, air trapping.;  b.  seen by pulmo (Dr. Shelle Iron) 5/14: mainly upper airway symptoms - ACE d/c'd (sx's better)  . Chronic diastolic CHF (congestive heart failure)     MEDICATIONS AT HOME: Prior to Admission medications   Medication Sig Start Date End Date Taking? Authorizing Provider  albuterol (PROVENTIL HFA;VENTOLIN HFA) 108 (90 BASE) MCG/ACT inhaler Inhale 2 puffs into the lungs every 4 (four) hours as needed for wheezing or shortness of breath. 06/15/12 06/15/13 Yes Dorothea Ogle, MD  amLODipine (NORVASC) 10 MG tablet Take 1 tablet (10 mg total) by mouth daily. 10/31/12  Yes Shanker Levora Dredge, MD  aspirin 81 MG tablet Take 81 mg by mouth daily.   Yes Historical Provider, MD  atorvastatin (LIPITOR) 40 MG tablet Take 1 tablet (40 mg total) by mouth daily at 6 PM. 10/24/12  Yes Evelena Peat, DO  citalopram (CELEXA) 40 MG tablet Take 40 mg by mouth daily. 06/15/12  Yes Dorothea Ogle, MD  cloNIDine (CATAPRES) 0.3 MG tablet Take 0.3 mg by mouth 2 (two) times daily. 07/13/12 07/13/13 Yes  Beatrice Lecher, PA-C  ferrous sulfate 325 (65 FE) MG tablet Take 1 tablet (325 mg total) by mouth 2 (two) times daily with a meal. 10/31/12 10/31/13 Yes Shanker Levora Dredge, MD  furosemide (LASIX) 20 MG tablet Take 1 tablet (20 mg total) by mouth 2 (two) times daily. 07/13/12  Yes Beatrice Lecher, PA-C  hydrOXYzine (ATARAX/VISTARIL) 25 MG tablet Take 25 mg by mouth at bedtime as needed (sleep).   Yes Historical Provider, MD  methocarbamol (ROBAXIN) 500 MG tablet Take 1 tablet (500 mg total) by mouth 3 (three) times daily. 10/24/12  Yes Evelena Peat, DO  Multiple Vitamin (MULTIVITAMIN WITH MINERALS) TABS Take 1 tablet by mouth daily. 11/18/11  Yes Ronny Bacon, MD  traZODone (DESYREL) 100 MG tablet Take 100 mg  by mouth at bedtime. 06/15/12  Yes Dorothea Ogle, MD  loratadine (CLARITIN) 10 MG tablet Take 1 tablet (10 mg total) by mouth daily. 10/31/12   Shanker Levora Dredge, MD  polyethylene glycol powder (GLYCOLAX/MIRALAX) powder Take 17 g by mouth daily as needed (constipation).     Historical Provider, MD  potassium chloride (K-DUR,KLOR-CON) 10 MEQ tablet Take 1 tablet (10 mEq total) by mouth 2 (two) times daily with a meal. 07/13/12   Beatrice Lecher, PA-C     Exam  General appearance :Awake, alert, not in any distress. Speech Clear. Not toxic Looking HEENT: Atraumatic and Normocephalic, pupils equally reactive to light and accomodation Neck: supple, no JVD. No cervical lymphadenopathy.  Chest:Good air entry bilaterally, no added sounds  CVS: S1 S2 regular, no murmurs.  Abdomen: Bowel sounds present, Non tender and not distended with no gaurding, rigidity or rebound. Extremities: B/L Lower Ext shows no edema, both legs are warm to touch Neurology: Awake alert, and oriented X 3, CN II-XII intact, Non focal Skin:No Rash Wounds:N/A    Data Review   CBC No results found for this basename: WBC, HGB, HCT, PLT, MCV, MCH, MCHC, RDW, NEUTRABS, LYMPHSABS, MONOABS, EOSABS, BASOSABS, BANDABS, BANDSABD,  in the last 168 hours  Chemistries   No results found for this basename: NA, K, CL, CO2, GLUCOSE, BUN, CREATININE, GFRCGP, CALCIUM, MG, AST, ALT, ALKPHOS, BILITOT,  in the last 168 hours ------------------------------------------------------------------------------------------------------------------ No results found for this basename: HGBA1C,  in the last 72 hours ------------------------------------------------------------------------------------------------------------------ No results found for this basename: CHOL, HDL, LDLCALC, TRIG, CHOLHDL, LDLDIRECT,  in the last 72 hours ------------------------------------------------------------------------------------------------------------------ No results  found for this basename: TSH, T4TOTAL, FREET3, T3FREE, THYROIDAB,  in the last 72 hours ------------------------------------------------------------------------------------------------------------------ No results found for this basename: VITAMINB12, FOLATE, FERRITIN, TIBC, IRON, RETICCTPCT,  in the last 72 hours  Coagulation profile  No results found for this basename: INR, PROTIME,  in the last 168 hours    Assessment & Plan   Recent admission for chest pain -Resolved - Suspected to be musculoskeletal - Does have a history of CAD-continue with aspirin  Chronic Diastolic Heart Failure  -compensated clinically  -continue with Lasix, c/w lisinopril  -follows with [facility] as well   HTN  -currently controlled with Amlodipine and Lisinopril   Hx of [place] CVA  -no major residual deficits  -c/w ASA   Gen Health Maintenance  -Mammography in 2014 -Pap Smear 2014   Follow up in 2 months  The patient was given clear instructions to go to ER or return to medical center if symptoms don't improve, worsen or new problems develop. The patient verbalized understanding. The patient was told to call to get lab results  if they haven't heard anything in the next week.

## 2013-01-02 ENCOUNTER — Encounter: Payer: Self-pay | Admitting: Physician Assistant

## 2013-01-02 ENCOUNTER — Ambulatory Visit: Payer: Self-pay

## 2013-01-02 NOTE — Progress Notes (Signed)
This encounter was created in error - please disregard.

## 2013-01-03 ENCOUNTER — Encounter: Payer: Self-pay | Admitting: Physician Assistant

## 2013-01-11 ENCOUNTER — Other Ambulatory Visit: Payer: Self-pay

## 2013-01-11 DIAGNOSIS — I10 Essential (primary) hypertension: Secondary | ICD-10-CM

## 2013-01-11 MED ORDER — AMLODIPINE BESYLATE 10 MG PO TABS: 10.0000 mg | ORAL_TABLET | Freq: Every day | ORAL | Status: DC

## 2013-01-26 ENCOUNTER — Other Ambulatory Visit: Payer: Self-pay

## 2013-02-15 ENCOUNTER — Encounter (HOSPITAL_COMMUNITY): Payer: Self-pay | Admitting: Emergency Medicine

## 2013-02-15 ENCOUNTER — Emergency Department (HOSPITAL_COMMUNITY): Payer: No Typology Code available for payment source

## 2013-02-15 ENCOUNTER — Emergency Department (HOSPITAL_COMMUNITY)
Admission: EM | Admit: 2013-02-15 | Discharge: 2013-02-16 | Disposition: A | Discharge status: Home / Self Care | Payer: No Typology Code available for payment source | Attending: Emergency Medicine | Admitting: Emergency Medicine

## 2013-02-15 DIAGNOSIS — Z7982 Long term (current) use of aspirin: Secondary | ICD-10-CM | POA: Insufficient documentation

## 2013-02-15 DIAGNOSIS — Z79899 Other long term (current) drug therapy: Secondary | ICD-10-CM | POA: Insufficient documentation

## 2013-02-15 DIAGNOSIS — J45909 Unspecified asthma, uncomplicated: Secondary | ICD-10-CM | POA: Insufficient documentation

## 2013-02-15 DIAGNOSIS — I2510 Atherosclerotic heart disease of native coronary artery without angina pectoris: Secondary | ICD-10-CM | POA: Insufficient documentation

## 2013-02-15 DIAGNOSIS — J029 Acute pharyngitis, unspecified: Secondary | ICD-10-CM

## 2013-02-15 DIAGNOSIS — R509 Fever, unspecified: Secondary | ICD-10-CM | POA: Insufficient documentation

## 2013-02-15 DIAGNOSIS — R630 Anorexia: Secondary | ICD-10-CM | POA: Insufficient documentation

## 2013-02-15 DIAGNOSIS — I5032 Chronic diastolic (congestive) heart failure: Secondary | ICD-10-CM | POA: Insufficient documentation

## 2013-02-15 DIAGNOSIS — Z95818 Presence of other cardiac implants and grafts: Secondary | ICD-10-CM | POA: Insufficient documentation

## 2013-02-15 DIAGNOSIS — F17200 Nicotine dependence, unspecified, uncomplicated: Secondary | ICD-10-CM | POA: Insufficient documentation

## 2013-02-15 DIAGNOSIS — Z8673 Personal history of transient ischemic attack (TIA), and cerebral infarction without residual deficits: Secondary | ICD-10-CM | POA: Insufficient documentation

## 2013-02-15 DIAGNOSIS — E860 Dehydration: Secondary | ICD-10-CM

## 2013-02-15 DIAGNOSIS — Z3202 Encounter for pregnancy test, result negative: Secondary | ICD-10-CM | POA: Insufficient documentation

## 2013-02-15 DIAGNOSIS — J3489 Other specified disorders of nose and nasal sinuses: Secondary | ICD-10-CM | POA: Insufficient documentation

## 2013-02-15 DIAGNOSIS — D509 Iron deficiency anemia, unspecified: Secondary | ICD-10-CM | POA: Insufficient documentation

## 2013-02-15 DIAGNOSIS — I2789 Other specified pulmonary heart diseases: Secondary | ICD-10-CM | POA: Insufficient documentation

## 2013-02-15 DIAGNOSIS — R079 Chest pain, unspecified: Secondary | ICD-10-CM | POA: Insufficient documentation

## 2013-02-15 LAB — COMPREHENSIVE METABOLIC PANEL
ALT: 10 U/L (ref 0–35)
AST: 14 U/L (ref 0–37)
Albumin: 3.7 g/dL (ref 3.5–5.2)
Alkaline Phosphatase: 85 U/L (ref 39–117)
Calcium: 9.6 mg/dL (ref 8.4–10.5)
Chloride: 102 mEq/L (ref 96–112)
GFR calc Af Amer: 55 mL/min — ABNORMAL LOW (ref >90)
Glucose, Bld: 96 mg/dL (ref 70–99)
Potassium: 3.9 mEq/L (ref 3.5–5.1)
Sodium: 136 mEq/L (ref 135–145)
Total Bilirubin: 0.4 mg/dL (ref 0.3–1.2)
Total Protein: 9.4 g/dL — ABNORMAL HIGH (ref 6.0–8.3)

## 2013-02-15 LAB — CBC
HCT: 33.2 % — ABNORMAL LOW (ref 36.0–46.0)
Hemoglobin: 10.8 g/dL — ABNORMAL LOW (ref 12.0–15.0)
MCHC: 32.5 g/dL (ref 30.0–36.0)
Platelets: 294 10*3/uL (ref 150–400)
WBC: 2.9 10*3/uL — ABNORMAL LOW (ref 4.0–10.5)

## 2013-02-15 LAB — URINALYSIS, ROUTINE W REFLEX MICROSCOPIC
Bilirubin Urine: NEGATIVE
Glucose, UA: NEGATIVE mg/dL
Ketones, ur: NEGATIVE mg/dL
Specific Gravity, Urine: 1.024 (ref 1.005–1.030)
Urobilinogen, UA: 1 mg/dL (ref 0.0–1.0)
pH: 5.5 (ref 5.0–8.0)

## 2013-02-15 LAB — RAPID URINE DRUG SCREEN, HOSP PERFORMED
Amphetamines: NOT DETECTED
Benzodiazepines: NOT DETECTED
Cocaine: POSITIVE — AB
Opiates: NOT DETECTED
Tetrahydrocannabinol: NOT DETECTED

## 2013-02-15 LAB — URINE MICROSCOPIC-ADD ON

## 2013-02-15 LAB — RAPID STREP SCREEN (MED CTR MEBANE ONLY): Streptococcus, Group A Screen (Direct): NEGATIVE

## 2013-02-15 MED ORDER — HYDROMORPHONE HCL PF 1 MG/ML IJ SOLN
1.0000 mg | Freq: Once | INTRAMUSCULAR | Status: AC
  Administered 2013-02-15: 1 mg via INTRAVENOUS
  Filled 2013-02-15: qty 1

## 2013-02-15 MED ORDER — ONDANSETRON HCL 4 MG/2ML IJ SOLN
4.0000 mg | Freq: Once | INTRAMUSCULAR | Status: AC
  Administered 2013-02-15: 4 mg via INTRAVENOUS
  Filled 2013-02-15: qty 2

## 2013-02-15 MED ORDER — GI COCKTAIL ~~LOC~~
30.0000 mL | Freq: Once | ORAL | Status: AC
  Administered 2013-02-15: 30 mL via ORAL
  Filled 2013-02-15: qty 30

## 2013-02-15 MED ORDER — DEXAMETHASONE SODIUM PHOSPHATE 10 MG/ML IJ SOLN
10.0000 mg | Freq: Once | INTRAMUSCULAR | Status: AC
  Administered 2013-02-15: 10 mg via INTRAVENOUS
  Filled 2013-02-15: qty 1

## 2013-02-15 MED ORDER — IOHEXOL 300 MG/ML  SOLN
80.0000 mL | Freq: Once | INTRAMUSCULAR | Status: AC | PRN
  Administered 2013-02-15: 80 mL via INTRAVENOUS

## 2013-02-15 MED ORDER — SODIUM CHLORIDE 0.9 % IV BOLUS (SEPSIS)
1000.0000 mL | Freq: Once | INTRAVENOUS | Status: AC
  Administered 2013-02-15: 1000 mL via INTRAVENOUS

## 2013-02-15 MED ORDER — MORPHINE SULFATE 4 MG/ML IJ SOLN
4.0000 mg | Freq: Once | INTRAMUSCULAR | Status: AC
  Administered 2013-02-15: 4 mg via INTRAVENOUS
  Filled 2013-02-15: qty 1

## 2013-02-15 MED ORDER — HYDROCODONE-ACETAMINOPHEN 7.5-325 MG/15ML PO SOLN: 15.0000 mL | Freq: Four times a day (QID) | ORAL | Status: DC | PRN

## 2013-02-15 NOTE — ED Notes (Signed)
She c/o sore throat since yesterday--hurts to swallow.  She is in no distress.

## 2013-02-15 NOTE — ED Notes (Signed)
EKG given to EDP, Linker,MD.

## 2013-02-15 NOTE — ED Provider Notes (Signed)
CSN: [id number]     Arrival date & time 02/15/13  1820 History   First MD Initiated Contact with Patient 02/15/13 1837     Chief Complaint  Patient presents with  . Sore Throat   (Consider location/radiation/quality/duration/timing/severity/associated sxs/prior Treatment) Patient is a 47 y.o. female presenting with pharyngitis. The history is provided by the patient.  Sore Throat This is a new problem. The current episode started 2 days ago. The problem occurs constantly. Associated symptoms comments: Chills and fever today.  Unable to swallow.  Started yesterday but was able to get her meds down yesterday but today unable to swallow anything including water or meds.  Some rhinorrhea and coughing as well.. The symptoms are aggravated by swallowing. Nothing relieves the symptoms. She has tried nothing for the symptoms. The treatment provided no relief.    Past Medical History  Diagnosis Date  . Coronary artery disease     Lexiscan myoview (10/12) with significant ST depression upon Lexiscan injection but no ischemia or infarction on perfusion images. Left heart cath (10/12): 30% mid LAD, 30% ostial D1, 50% ostial D2.    . Stroke   . Hypertension   . Tricuspid regurgitation   . Iron deficiency     hx of  . Polysubstance abuse     Prior cocaine  . Tobacco abuse   . Leukocytoclastic vasculitis     Rash across lower body, occurred in 9/11, diagnosed by skin biopsy. ANA positive. Thought to be secondary to cocaine use.  . Pulmonary HTN     Echo (9/11) with EF 65%, mild LVH, mild AI, mild MR, moderate to possibly severe TR with PA systolic pressure 52 mmHg. Echo (10/12): severe LV hypertrophy, EF 60-65%, mild MR, mild AI, moderate to severe tricuspid regurgitation, PA systolic pressure 38 mmHg.  Echo 4/14: moderate LVH, EF 65%, normal wall motion, diastolic dysfunction, mild AI, mild MR  . Asthma   . Shortness of breath     a. PFTs 5/14:  FEV1/FVC 99% predicted; FVC 60% predicted; DLCO  mildly reduced, mild restriction, air trapping.;  b.  seen by pulmo (Dr. Shelle Iron) 5/14: mainly upper airway symptoms - ACE d/c'd (sx's better)  . Chronic diastolic CHF (congestive heart failure)    Past Surgical History  Procedure Laterality Date  . Ankle surgery      right  . Cardiac catheterization    . I&d extremity  08/09/2011    Procedure: IRRIGATION AND DEBRIDEMENT EXTREMITY;  Surgeon: Robyne Askew, MD;  Location: [facility] OR;  Service: General;  Laterality: Left;  i & D Left axilla abscess   Family History  Problem Relation Age of Onset  . Coronary artery disease Mother   . Diabetes Mother   . Breast cancer Maternal Grandmother   . Diabetes Maternal Grandmother   . Diabetes Father   . Hypertension Father   . Coronary artery disease Father    History  Substance Use Topics  . Smoking status: Current Every Day Smoker -- 0.50 packs/day for 20 years    Types: Cigarettes  . Smokeless tobacco: Never Used     Comment: 5 cigs --- uses E-cig  . Alcohol Use: No     Comment: pint of wine 2-3 times a week--ocassional   OB History   Grav Para Term Preterm Abortions TAB SAB Ect Mult Living   4 2 2  2 2    2      Review of Systems  Constitutional: Positive for fever, chills and appetite  change.  HENT: Positive for rhinorrhea, sore throat and trouble swallowing. Negative for voice change.   Respiratory: Positive for cough.   Cardiovascular:       Nonspecific chest pain earlier today.  No active chest pain now.  Hx of CAD but no stenting at this time.  Gastrointestinal: Negative for nausea and vomiting.  All other systems reviewed and are negative.    Allergies  Ciprofloxacin  Home Medications   Current Outpatient Rx  Name  Route  Sig  Dispense  Refill  . albuterol (PROVENTIL HFA;VENTOLIN HFA) 108 (90 BASE) MCG/ACT inhaler   Inhalation   Inhale 2 puffs into the lungs every 4 (four) hours as needed for wheezing or shortness of breath.         [name] amLODipine (NORVASC) 10 MG  tablet   Oral   Take 1 tablet (10 mg total) by mouth daily.   30 tablet   1     .Marland KitchenPatient needs to contact office to schedule  App ...   . aspirin 81 MG tablet   Oral   Take 81 mg by mouth daily.         . citalopram (CELEXA) 40 MG tablet   Oral   Take 40 mg by mouth daily.         . cloNIDine (CATAPRES) 0.3 MG tablet   Oral   Take 0.3 mg by mouth 2 (two) times daily.         . ferrous sulfate 325 (65 FE) MG tablet   Oral   Take 1 tablet (325 mg total) by mouth 2 (two) times daily with a meal.   60 tablet   3   . furosemide (LASIX) 20 MG tablet   Oral   Take 1 tablet (20 mg total) by mouth 2 (two) times daily.   60 tablet   11   . hydrOXYzine (ATARAX/VISTARIL) 25 MG tablet   Oral   Take 25 mg by mouth at bedtime as needed (sleep).         . loratadine (CLARITIN) 10 MG tablet   Oral   Take 1 tablet (10 mg total) by mouth daily.   30 tablet   3   . Multiple Vitamin (MULTIVITAMIN WITH MINERALS) TABS   Oral   Take 1 tablet by mouth daily.         . polyethylene glycol powder (GLYCOLAX/MIRALAX) powder   Oral   Take 17 g by mouth daily as needed (constipation).          . potassium chloride (K-DUR,KLOR-CON) 10 MEQ tablet   Oral   Take 1 tablet (10 mEq total) by mouth 2 (two) times daily with a meal.   60 tablet   11   . traZODone (DESYREL) 100 MG tablet   Oral   Take 100 mg by mouth at bedtime.          BP 136/77  Pulse 94  Temp(Src) 99.5 F (37.5 C) (Oral)  Resp 16  SpO2 100%  LMP 12/06/2012 Physical Exam  Nursing note and vitals reviewed. Constitutional: She is oriented to person, place, and time. She appears well-developed and well-nourished. No distress.  HENT:  Head: Normocephalic and atraumatic.  Mouth/Throat: Mucous membranes are dry. Posterior oropharyngeal edema and posterior oropharyngeal erythema present. No oropharyngeal exudate or tonsillar abscesses.  Eyes: Conjunctivae and EOM are normal. Pupils are equal, round, and  reactive to light.  Neck: Normal range of motion. Neck supple.  Bilateral enlarged painful cervical nodes  in the anterior/posterior and submandibular chains.  Pain with movement of the neck to the right  Cardiovascular: Normal rate, regular rhythm and intact distal pulses.   No murmur heard. Pulmonary/Chest: Effort normal and breath sounds normal. No respiratory distress. She has no wheezes. She has no rales.  Abdominal: Soft. She exhibits no distension. There is no tenderness. There is no rebound and no guarding.  Musculoskeletal: Normal range of motion. She exhibits no edema and no tenderness.  Lymphadenopathy:    She has cervical adenopathy.  Neurological: She is alert and oriented to person, place, and time.  Skin: Skin is warm and dry. No rash noted. No erythema.  Psychiatric: She has a normal mood and affect. Her behavior is normal.    ED Course  Procedures (including critical care time) Labs Review Labs Reviewed  CBC - Abnormal; Notable for the following:    WBC 2.9 (*)    Hemoglobin 10.8 (*)    HCT 33.2 (*)    All other components within normal limits  COMPREHENSIVE METABOLIC PANEL - Abnormal; Notable for the following:    Creatinine, Ser 1.29 (*)    Total Protein 9.4 (*)    GFR calc non Af Amer 48 (*)    GFR calc Af Amer 55 (*)    All other components within normal limits  URINALYSIS, ROUTINE W REFLEX MICROSCOPIC - Abnormal; Notable for the following:    APPearance CLOUDY (*)    Hgb urine dipstick MODERATE (*)    All other components within normal limits  URINE RAPID DRUG SCREEN (HOSP PERFORMED) - Abnormal; Notable for the following:    Cocaine POSITIVE (*)    All other components within normal limits  URINE MICROSCOPIC-ADD ON - Abnormal; Notable for the following:    Squamous Epithelial / LPF MANY (*)    Bacteria, UA MANY (*)    All other components within normal limits  RAPID STREP SCREEN  CULTURE, GROUP A STREP  TROPONIN I  PREGNANCY, URINE  HIV ANTIBODY  (ROUTINE TESTING)   Imaging Review Dg Chest 2 View  02/15/2013   CLINICAL DATA:  Sore throat, cough, chest pain, shortness of breath and fever.  EXAM: CHEST  2 VIEW  COMPARISON:  10/21/2012.  FINDINGS: Trachea is midline. Heart size within normal limits. Scarring at the left lung base. Lungs are otherwise clear. No pleural fluid.  IMPRESSION: No acute findings.   Electronically Signed   By: Leanna Battles M.D.   On: 02/15/2013 20:18   Ct Soft Tissue Neck W Contrast  02/15/2013   CLINICAL DATA:  Sore throat with dysphagia.  EXAM: CT NECK WITH CONTRAST  TECHNIQUE: Multidetector CT imaging of the neck was performed using the standard protocol following the bolus administration of intravenous contrast.  CONTRAST:  80mL OMNIPAQUE IOHEXOL 300 MG/ML  SOLN  COMPARISON:  MRI head 11/12/2011.  CT head 11/12/2011.  FINDINGS: There are prominent lymph nodes throughout the anterior and posterior cervical chains as well as in the submental region, extending down to the supraclavicular region and involving all levels bilaterally. Lymph nodes measure up to about 16 mm short axis dimension. There is prominent enlargement of the tonsils bilaterally with nodular prominence of the mucosal soft tissues extending down to the level of the hyoid bone. There is focal narrowing of the suprahyoid airway. Adjacent fat planes are displaced without definite infiltration or edema. Do enlarged adenoids. There is a small fluid collection demonstrated at the midline in the adenoids measuring about 8 x 9 x  10 mm. 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  The carotid and jugular vessels are patent without significant effacement or displacement. Visualized bones appear intact. Multiple tooth extractions with dental caries and periapical lucencies consistent with periodontal disease.  IMPRESSION: Diffuse enlarged lymph nodes throughout the neck and supraclavicular region with prominent nodular  enlargement of the tonsils. Changes could represent infectious or inflammatory process, lymphoma, or lymphoproliferative disorder.   Electronically Signed   By: Burman Nieves M.D.   On: 02/15/2013 22:00    EKG Interpretation    Date/Time:  Wednesday February 15 2013 19:57:59 EST Ventricular Rate:  97 PR Interval:  151 QRS Duration: 84 QT Interval:  339 QTC Calculation: 431 R Axis:   59 Text Interpretation:  Sinus rhythm Borderline T abnormalities, lateral leads No significant change since last tracing Confirmed by [name]  MD, Simaya Lumadue ([id number]) on 02/15/2013 8:33:21 PM            MDM   1. Pharyngitis   2. Dehydration     Patient with a sore throat with tense painful cervical adenopathy in patient with difficulty swallowing.  Patient unable to swallow any of her medication today or water. Denies regurgitation or vomiting. Subjective fever earlier today at noon noted voice changes on exam. Erythema of the tonsils on exam but no signs concerning for peritonsillar abscess however given symptoms of patient's swallowing difficulty feel that a CT is warranted to rule out RPA or epiglottitis. Rapid strep neg.  CBC, CMP, troponin (due to earlier c/o of chest pain), UA, soft tissue neck CT pending. Pt given IVF and pain control.    10:05 PM Labs are unrevealing except for dehydration with elevated Cr.  CT showed diffuse lymphadenopathy most likely infectious but other differentials possible due to starting abruptly 2 days ago feel most likely infectious.  After pain control pt able to swallow.  Gwyneth Sprout, MD 02/15/13 [phone]

## 2013-02-15 NOTE — ED Notes (Signed)
Pt denies no further chest pain

## 2013-02-15 NOTE — ED Notes (Signed)
Pt noted to be dehydrated, c/o L sided CP since yesterday, moved to acute room.

## 2013-02-15 NOTE — ED Notes (Signed)
Pt tolerating liquids well taken slowly without problems

## 2013-02-15 NOTE — ED Provider Notes (Signed)
MSE was initiated and I personally evaluated the patient and placed orders (if any) at  7:51 PM on February 15, 2013.  Morgan Bean is a 47 y.o. female who presents to the Emergency Department complaining of constant worsening sore throat onset yesterday. Describes pain as aching, throat closing sensation-reports that she feels as if her throat is swelling up. Patient reports that she has dysphasia, reports that she has difficulty swallowing with both fluids and solids. Patient reports she was unable to take her medications today secondary to difficulty swallowing and pain with swallowing. Patient reports she's been unable to keep down any fluids, reported that she has been having difficulty with swallowing fluids, reported that she's been having decrease in urination-reports that she normally urinates all the time during the day secondary to being on Lasix. Reports sore throat is exacerbated by swallowing and alleviated by nothing. Patient reports that this morning she started to experience left-sided chest pain, described as "just a pain" that has been constant without radiation. Denies associated nausea, vomiting, abdominal pain, new laundry detergent, new soap, shortness of breath, difficulty breathing, left arm pain, neck stiffness, dizziness, dysuria, numbness, and sick contacts.  Alert and oriented. GCS 15. Lips dry, dry mucous membranes noted. Swollen tonsils bilaterally with negative exudate, petechiae. Uvula midline, symmetrical elevation. Tonsillar adenopathy identified upon palpation, pain upon palpation. Discomfort with palpation to the neck, discomfort with rotation of neck. Tachycardia noted. Lungs clear to auscultation bilaterally. Patient does not meet Fast track criteria. Patient to be moved to the main ED for further work-up to be performed. Patient stable for transfer.   The patient appears stable so that the remainder of the MSE may be completed by another provider.  Raymon Mutton,  PA-C 02/16/13 1604

## 2013-02-17 ENCOUNTER — Telehealth (HOSPITAL_COMMUNITY): Payer: Self-pay | Admitting: Emergency Medicine

## 2013-02-17 LAB — HIV ANTIBODY (ROUTINE TESTING W REFLEX): HIV: REACTIVE — AB

## 2013-02-17 LAB — CULTURE, GROUP A STREP

## 2013-02-17 NOTE — ED Provider Notes (Signed)
Medical screening examination/treatment/procedure(s) were performed by non-physician practitioner and as supervising physician I was immediately available for consultation/collaboration.  EKG Interpretation    Date/Time:  Wednesday February 15 2013 19:57:59 EST Ventricular Rate:  97 PR Interval:  151 QRS Duration: 84 QT Interval:  339 QTC Calculation: 431 R Axis:   59 Text Interpretation:  Sinus rhythm Borderline T abnormalities, lateral leads No significant change since last tracing Confirmed by [name]  MD, WHITNEY ([id number]) on 02/15/2013 8:33:21 PM             [name], MD 02/17/13 1505

## 2013-02-21 ENCOUNTER — Telehealth: Payer: Self-pay | Admitting: Infectious Disease

## 2013-02-21 NOTE — Telephone Encounter (Signed)
PATIENT TESTED POSITIVE AT ED FOR HIV BY ELISA BUT NEEDS CONFIRMATORY WESTERN BLOT DONE --THERE WAS NOT ENOUGH SERUM LEFT TO DO THIS.  A FASTER MEANS WOULD BE TO DO AN HIV ULTRA QUANT RNA VIRAL LOAD.   THIS COULD BE DONE AT HER IM APPT ON THE THIRD  OTHERWISE I WOULD BE HAPPY TO CALL HER AND HAVE HER COMPLETE TESTING WITH [place] AT [facility]  I DO NOT BELIEVE PT IS AWARE OF THIS TEST RESULT

## 2013-02-22 ENCOUNTER — Ambulatory Visit: Payer: No Typology Code available for payment source | Attending: Internal Medicine | Admitting: Internal Medicine

## 2013-02-22 ENCOUNTER — Encounter: Payer: Self-pay | Admitting: Internal Medicine

## 2013-02-22 VITALS — BP 115/81 | HR 94 | Temp 98.9°F | Resp 14 | Ht 66.0 in | Wt 197.4 lb

## 2013-02-22 DIAGNOSIS — J029 Acute pharyngitis, unspecified: Secondary | ICD-10-CM | POA: Insufficient documentation

## 2013-02-22 DIAGNOSIS — R599 Enlarged lymph nodes, unspecified: Secondary | ICD-10-CM

## 2013-02-22 DIAGNOSIS — R590 Localized enlarged lymph nodes: Secondary | ICD-10-CM

## 2013-02-22 LAB — COMPLETE METABOLIC PANEL WITH GFR
ALT: <8 U/L (ref 0–35)
AST: 14 U/L (ref 0–37)
Albumin: 4 g/dL (ref 3.5–5.2)
Alkaline Phosphatase: 77 U/L (ref 39–117)
BUN: 13 mg/dL (ref 6–23)
Calcium: 9.7 mg/dL (ref 8.4–10.5)
Chloride: 103 mEq/L (ref 96–112)
Creat: 1.31 mg/dL — ABNORMAL HIGH (ref 0.50–1.10)
Potassium: 4.4 mEq/L (ref 3.5–5.3)
Sodium: 134 mEq/L — ABNORMAL LOW (ref 135–145)
Total Bilirubin: 0.4 mg/dL (ref 0.3–1.2)
Total Protein: 9 g/dL — ABNORMAL HIGH (ref 6.0–8.3)

## 2013-02-22 NOTE — Progress Notes (Signed)
Patient ID: Morgan Bean, female   DOB: 07/20/1965, 47 y.o.   MRN: [id number]  CC:  HPI: 47 year old female with a history of coronary artery disease, CVA, hypertension, ongoing cocaine use was here for a followup visit. The patient was recently in the ER on 11/26 with chills and fever unable to swallow. Significant cervical lymphadenopathy. She had a CT scan done that showed diffuse enlarged lymph nodes throughout the neck in the supraclavicular region  Patient denies any recent weight loss, HIV antibody was reactive, she was diagnosed with viral pharyngitis. Rapid strep test was negative, she has been afebrile at home, she is able to swallow, she denies having a cough or painful swallowing.    Allergies  Allergen Reactions  . Ciprofloxacin Itching   Past Medical History  Diagnosis Date  . Coronary artery disease     Lexiscan myoview (10/12) with significant ST depression upon Lexiscan injection but no ischemia or infarction on perfusion images. Left heart cath (10/12): 30% mid LAD, 30% ostial D1, 50% ostial D2.    . Stroke   . Hypertension   . Tricuspid regurgitation   . Iron deficiency     hx of  . Polysubstance abuse     Prior cocaine  . Tobacco abuse   . Leukocytoclastic vasculitis     Rash across lower body, occurred in 9/11, diagnosed by skin biopsy. ANA positive. Thought to be secondary to cocaine use.  . Pulmonary HTN     Echo (9/11) with EF 65%, mild LVH, mild AI, mild MR, moderate to possibly severe TR with PA systolic pressure 52 mmHg. Echo (10/12): severe LV hypertrophy, EF 60-65%, mild MR, mild AI, moderate to severe tricuspid regurgitation, PA systolic pressure 38 mmHg.  Echo 4/14: moderate LVH, EF 65%, normal wall motion, diastolic dysfunction, mild AI, mild MR  . Asthma   . Shortness of breath     a. PFTs 5/14:  FEV1/FVC 99% predicted; FVC 60% predicted; DLCO mildly reduced, mild restriction, air trapping.;  b.  seen by pulmo (Dr. Shelle Iron) 5/14: mainly upper airway  symptoms - ACE d/c'd (sx's better)  . Chronic diastolic CHF (congestive heart failure)    Current Outpatient Prescriptions on File Prior to Visit  Medication Sig Dispense Refill  . albuterol (PROVENTIL HFA;VENTOLIN HFA) 108 (90 BASE) MCG/ACT inhaler Inhale 2 puffs into the lungs every 4 (four) hours as needed for wheezing or shortness of breath.      [name] amLODipine (NORVASC) 10 MG tablet Take 1 tablet (10 mg total) by mouth daily.  30 tablet  1  . aspirin 81 MG tablet Take 81 mg by mouth daily.      . citalopram (CELEXA) 40 MG tablet Take 40 mg by mouth daily.      . cloNIDine (CATAPRES) 0.3 MG tablet Take 0.3 mg by mouth 2 (two) times daily.      . ferrous sulfate 325 (65 FE) MG tablet Take 1 tablet (325 mg total) by mouth 2 (two) times daily with a meal.  60 tablet  3  . furosemide (LASIX) 20 MG tablet Take 1 tablet (20 mg total) by mouth 2 (two) times daily.  60 tablet  11  . hydrOXYzine (ATARAX/VISTARIL) 25 MG tablet Take 25 mg by mouth at bedtime as needed (sleep).      . loratadine (CLARITIN) 10 MG tablet Take 1 tablet (10 mg total) by mouth daily.  30 tablet  3  . Multiple Vitamin (MULTIVITAMIN WITH MINERALS) TABS Take 1 tablet by mouth  daily.      . polyethylene glycol powder (GLYCOLAX/MIRALAX) powder Take 17 g by mouth daily as needed (constipation).       . potassium chloride (K-DUR,KLOR-CON) 10 MEQ tablet Take 1 tablet (10 mEq total) by mouth 2 (two) times daily with a meal.  60 tablet  11  . traZODone (DESYREL) 100 MG tablet Take 100 mg by mouth at bedtime.      [name] HYDROcodone-acetaminophen (HYCET) 7.5-325 mg/15 ml solution Take 15 mLs by mouth 4 (four) times daily as needed for moderate pain.  120 mL  0   No current facility-administered medications on file prior to visit.   Family History  Problem Relation Age of Onset  . Coronary artery disease Mother   . Diabetes Mother   . Breast cancer Maternal Grandmother   . Diabetes Maternal Grandmother   . Diabetes Father   .  Hypertension Father   . Coronary artery disease Father    History   Social History  . Marital Status: Single    Spouse Name: N/A    Number of Children: 2  . Years of Education: N/A   Occupational History  . unemployed    Social History Main Topics  . Smoking status: Current Every Day Smoker -- 0.50 packs/day for 20 years    Types: Cigarettes  . Smokeless tobacco: Never Used     Comment: 5 cigs --- uses E-cig  . Alcohol Use: No     Comment: pint of wine 2-3 times a week--ocassional  . Drug Use: 1.00 per week    Special: "Crack" cocaine     Comment: trying to quit---- quit November 2013   . Sexual Activity: Yes   Other Topics Concern  . Not on file   Social History Narrative   Lives with mom, sisters and brothers.    Review of Systems  Constitutional: Negative for fever, chills, diaphoresis, activity change, appetite change and fatigue.  HENT: Negative for ear pain, nosebleeds, congestion, facial swelling, rhinorrhea, neck pain, neck stiffness and ear discharge.   Eyes: Negative for pain, discharge, redness, itching and visual disturbance.  Respiratory: Negative for cough, choking, chest tightness, shortness of breath, wheezing and stridor.   Cardiovascular: Negative for chest pain, palpitations and leg swelling.  Gastrointestinal: Negative for abdominal distention.  Genitourinary: Negative for dysuria, urgency, frequency, hematuria, flank pain, decreased urine volume, difficulty urinating and dyspareunia.  Musculoskeletal: Negative for back pain, joint swelling, arthralgias and gait problem.  Neurological: Negative for dizziness, tremors, seizures, syncope, facial asymmetry, speech difficulty, weakness, light-headedness, numbness and headaches.  Hematological: Negative for adenopathy. Does not bruise/bleed easily.  Psychiatric/Behavioral: Negative for hallucinations, behavioral problems, confusion, dysphoric mood, decreased concentration and agitation.    Objective:    Filed Vitals:   02/22/13 1608  BP: 115/81  Pulse: 94  Temp: 98.9 F (37.2 C)  Resp: 14    Physical Exam  Constitutional: Appears well-developed and well-nourished. No distress.  HENT: Normocephalic. External right and left ear normal. Oropharynx is clear and moist.  Eyes: Conjunctivae and EOM are normal. PERRLA, no scleral icterus.  Neck: Normal ROM. Neck supple. No JVD. No tracheal deviation. No thyromegaly.  CVS: RRR, S1/S2 +, no murmurs, no gallops, no carotid bruit.  Pulmonary: Effort and breath sounds normal, no stridor, rhonchi, wheezes, rales.  Abdominal: Soft. BS +,  no distension, tenderness, rebound or guarding.  Musculoskeletal: Normal range of motion. No edema and no tenderness.  Lymphadenopathy: No lymphadenopathy noted, cervical, inguinal. Neuro: Alert. Normal reflexes, muscle  tone coordination. No cranial nerve deficit. Skin: Skin is warm and dry. No rash noted. Not diaphoretic. No erythema. No pallor.  Psychiatric: Normal mood and affect. Behavior, judgment, thought content normal.   Lab Results  Component Value Date   WBC 2.9* 02/15/2013   HGB 10.8* 02/15/2013   HCT 33.2* 02/15/2013   MCV 80.2 02/15/2013   PLT 294 02/15/2013   Lab Results  Component Value Date   CREATININE 1.29* 02/15/2013   BUN 15 02/15/2013   NA 136 02/15/2013   K 3.9 02/15/2013   CL 102 02/15/2013   CO2 22 02/15/2013    Lab Results  Component Value Date   HGBA1C 5.5 04/06/2012   Lipid Panel     Component Value Date/Time   CHOL 162 04/07/2012 0221   TRIG 100 04/07/2012 0221   HDL 45 04/07/2012 0221   CHOLHDL 3.6 04/07/2012 0221   VLDL 20 04/07/2012 0221   LDLCALC 97 04/07/2012 0221       Assessment and plan:   Patient Active Problem List   Diagnosis Date Noted  . Fibroid uterus 09/14/2012  . Perimenopause 09/14/2012  . Chronic cough 08/12/2012  . DOE (dyspnea on exertion) 08/12/2012  . CVA (cerebral infarction) 08/09/2012  . Chest pain, mid sternal 04/07/2012  .  Pulmonary hypertension 04/06/2012  . Polysubstance abuse 04/06/2012  . Acute bronchitis 04/06/2012  . Cocaine abuse 11/16/2011  . Major depressive disorder, recurrent episode with psychotic features. 11/15/2011    Class: Acute  . Abscess and cellulitis 08/09/2011  . Chronic diastolic heart failure 02/24/2011  . CAD (coronary artery disease) 02/24/2011  . Hyperlipidemia 01/06/2011  . Hypertension 12/15/2010  . Chest pain 12/15/2010  . Exertional dyspnea 12/15/2010  . Smoking 12/15/2010       Pharyngitis Symptoms improving Concern for lymphoma Will check LDH, if elevated the patient will need a pan CT scan HIV ULTRA QUANT RNA VIRAL LOAD, Infectious disease consultation for positive HIV test Repeat renal function as the patient was slightly dehydrated Admits to using cocaine when last seen in the ER Current hemodynamically stable  Followup in 2 weeks   The patient was given clear instructions to go to ER or return to medical center if symptoms don't improve, worsen or new problems develop. The patient verbalized understanding. The patient was told to call to get any lab results if not heard anything in the next week.

## 2013-02-22 NOTE — Progress Notes (Signed)
Pt is here for a hospital f/u; pharyngitis, viral infection; previous swollen tonsils and lymph nodes.

## 2013-02-23 LAB — CBC WITH DIFFERENTIAL/PLATELET
Eosinophils Relative: 0 % (ref 0–5)
HCT: 30.8 % — ABNORMAL LOW (ref 36.0–46.0)
Lymphocytes Relative: 54 % — ABNORMAL HIGH (ref 12–46)
MCH: 25.7 pg — ABNORMAL LOW (ref 26.0–34.0)
MCV: 74.6 fL — ABNORMAL LOW (ref 78.0–100.0)
Platelets: 322 10*3/uL (ref 150–400)
RBC: 4.13 MIL/uL (ref 3.87–5.11)

## 2013-02-23 LAB — PATHOLOGIST SMEAR REVIEW

## 2013-02-24 LAB — HIV-1 RNA ULTRAQUANT REFLEX TO GENTYP+
HIV 1 RNA Quant: <20 copies/mL (ref <20)
HIV-1 RNA Quant, Log: <1.3 {Log} (ref <1.30)

## 2013-03-06 ENCOUNTER — Telehealth: Payer: Self-pay

## 2013-03-06 NOTE — Telephone Encounter (Addendum)
Follow up per Dr . Daiva Eves  Patient needs to repeat HIV testing to confirm HIV diagnosis.  No return calls.  Attempted to call Wellness office to see if physician discussed this information with patient.  The CMA was not able to confirm conversation.  Physician documentation does discuss HIV testing.  .   Viral Load testing : <20  No need to contact patient.  Results negative . 02-22-13  Laurell Josephs, RN

## 2013-03-08 NOTE — Telephone Encounter (Signed)
Sounds like ANOTHER false Positive EIA then. I would still consider getting the Western blot on the chance pt could have HIV 2 or be an "elite controller"

## 2013-03-08 NOTE — Telephone Encounter (Signed)
HIV RNA (-) 

## 2013-03-09 ENCOUNTER — Ambulatory Visit: Payer: No Typology Code available for payment source | Attending: Internal Medicine | Admitting: Internal Medicine

## 2013-03-09 VITALS — BP 109/73 | HR 100 | Temp 99.7°F | Resp 16 | Wt 192.8 lb

## 2013-03-09 DIAGNOSIS — R599 Enlarged lymph nodes, unspecified: Secondary | ICD-10-CM | POA: Insufficient documentation

## 2013-03-09 DIAGNOSIS — R5381 Other malaise: Secondary | ICD-10-CM | POA: Insufficient documentation

## 2013-03-09 DIAGNOSIS — Z21 Asymptomatic human immunodeficiency virus [HIV] infection status: Secondary | ICD-10-CM

## 2013-03-09 MED ORDER — AZITHROMYCIN 250 MG PO TABS: ORAL_TABLET | ORAL | Status: DC

## 2013-03-09 NOTE — Patient Instructions (Signed)
HIV Viral Load Test This is a test done to monitor the status of HIV disease, in conjunction with other lab tests and physical disease progression, and to guide therapy. It is first used during a first diagnoses with HIV, every 2 - 8 weeks at the start of therapy or therapy changes, and every 3 - 4 months during long-term therapy, or as your caregiver recommends; if therapy is effective, the viral load should decrease within 4 - 6 months. The viral load test provides important information that is used in conjunction with the CD4 cell count:   To monitor the status of HIV disease  To guide recommendations for therapy  To predict the future course of HIV  Evidence shows that keeping the viral load levels as low as possible for as long as possible decreases the complications of HIV disease and prolongs life. Public health guidelines state that treatment should be considered for asymptomatic HIV-infected people who have viral loads higher than 30,000 copies per milliliter of blood using a test known as a branched DNA test, or more than 55,000 copies using an RT-PCR test.  There are several methods for testing viral load; results are not interchangeable so it is important that the same method be used each time. PREPARATION FOR TEST No fasting is necessary. A blood sample is drawn by needle from a vein in your arm. NORMAL FINDINGS Undetected. Ranges for normal findings may vary among different laboratories and hospitals. You should always check with your doctor after having lab work or other tests done to discuss the meaning of your test results and whether your values are considered within normal limits. MEANING OF TEST  Your caregiver will go over the test results with you and discuss the importance and meaning of your results, as well as treatment options and the need for additional tests if necessary. OBTAINING THE TEST RESULTS It is your responsibility to obtain your test results. Ask the lab or  department performing the test when and how you will get your results. Document Released: 04/03/2004 Document Revised: 06/01/2011 Document Reviewed: 02/18/2008 [employer] Patient Information 2014 [employer].  

## 2013-03-09 NOTE — Progress Notes (Signed)
Patient is here for test results She did not elaborate as to which test

## 2013-03-10 ENCOUNTER — Telehealth: Payer: Self-pay | Admitting: Hematology and Oncology

## 2013-03-10 LAB — CBC WITH DIFFERENTIAL/PLATELET
Basophils Relative: 1 % (ref 0–1)
Lymphocytes Relative: 57 % — ABNORMAL HIGH (ref 12–46)
MCHC: 34.3 g/dL (ref 30.0–36.0)
MCV: 73.6 fL — ABNORMAL LOW (ref 78.0–100.0)
Monocytes Relative: 37 % — ABNORMAL HIGH (ref 3–12)
Platelets: 359 10*3/uL (ref 150–400)
RDW: 17.1 % — ABNORMAL HIGH (ref 11.5–15.5)
WBC: 4.5 10*3/uL (ref 4.0–10.5)

## 2013-03-10 LAB — COMPREHENSIVE METABOLIC PANEL
ALT: 20 U/L (ref 0–35)
AST: 28 U/L (ref 0–37)
Albumin: 3 g/dL — ABNORMAL LOW (ref 3.5–5.2)
Alkaline Phosphatase: 70 U/L (ref 39–117)
CO2: 22 mEq/L (ref 19–32)
Calcium: 8.9 mg/dL (ref 8.4–10.5)
Chloride: 100 mEq/L (ref 96–112)
Creat: 1.52 mg/dL — ABNORMAL HIGH (ref 0.50–1.10)
Glucose, Bld: 107 mg/dL — ABNORMAL HIGH (ref 70–99)
Potassium: 4.4 mEq/L (ref 3.5–5.3)
Sodium: 130 mEq/L — ABNORMAL LOW (ref 135–145)
Total Bilirubin: 0.5 mg/dL (ref 0.3–1.2)
Total Protein: 9 g/dL — ABNORMAL HIGH (ref 6.0–8.3)

## 2013-03-10 NOTE — Telephone Encounter (Signed)
S/W PT AND GVE NP APPT 12/30 @ 11 W/DR. SHADAD.  REFERRING DR. MYERS DX- ASYMPTOMATIC HIV INFEECTION WELCOME PACKET MAILED.

## 2013-03-10 NOTE — Telephone Encounter (Signed)
C/D 03/10/13 for appt. 03/21/13

## 2013-03-14 ENCOUNTER — Encounter: Payer: Self-pay | Admitting: Internal Medicine

## 2013-03-14 ENCOUNTER — Telehealth: Payer: Self-pay | Admitting: Emergency Medicine

## 2013-03-14 NOTE — Telephone Encounter (Signed)
Pt given Negative HIV results verified by DOB

## 2013-03-19 NOTE — Progress Notes (Signed)
Patient ID: Morgan Bean, female   DOB: Dec 17, 1965, 47 y.o.   MRN: [id number]   CC: Followup on HIV test  HPI: Patient is 47 year old female who comes to clinic and wants to discuss the HIV test results. She was told the test was reactive and she wants to find out what that means and what is the next step in evaluation and management. She explains for the past several months she has felt sick and with fatigue. She is explaining generalized malaise and muscle aches, poor oral appetite. She also explains she has noted enlarged lymph nodes on the side of her neck. She denies fevers and chills but reports night sweats over the past several months. She denies any specific abdominal or urinary concerns, no weight loss or weight gain, no recent sick contacts or exposures.  Allergies  Allergen Reactions  . Ciprofloxacin Itching   Past Medical History  Diagnosis Date  . Coronary artery disease     Lexiscan myoview (10/12) with significant ST depression upon Lexiscan injection but no ischemia or infarction on perfusion images. Left heart cath (10/12): 30% mid LAD, 30% ostial D1, 50% ostial D2.    . Stroke   . Hypertension   . Tricuspid regurgitation   . Iron deficiency     hx of  . Polysubstance abuse     Prior cocaine  . Tobacco abuse   . Leukocytoclastic vasculitis     Rash across lower body, occurred in 9/11, diagnosed by skin biopsy. ANA positive. Thought to be secondary to cocaine use.  . Pulmonary HTN     Echo (9/11) with EF 65%, mild LVH, mild AI, mild MR, moderate to possibly severe TR with PA systolic pressure 52 mmHg. Echo (10/12): severe LV hypertrophy, EF 60-65%, mild MR, mild AI, moderate to severe tricuspid regurgitation, PA systolic pressure 38 mmHg.  Echo 4/14: moderate LVH, EF 65%, normal wall motion, diastolic dysfunction, mild AI, mild MR  . Asthma   . Shortness of breath     a. PFTs 5/14:  FEV1/FVC 99% predicted; FVC 60% predicted; DLCO mildly reduced, mild restriction, air  trapping.;  b.  seen by pulmo (Dr. Shelle Iron) 5/14: mainly upper airway symptoms - ACE d/c'd (sx's better)  . Chronic diastolic CHF (congestive heart failure)    Current Outpatient Prescriptions on File Prior to Visit  Medication Sig Dispense Refill  . albuterol (PROVENTIL HFA;VENTOLIN HFA) 108 (90 BASE) MCG/ACT inhaler Inhale 2 puffs into the lungs every 4 (four) hours as needed for wheezing or shortness of breath.      [name] amLODipine (NORVASC) 10 MG tablet Take 1 tablet (10 mg total) by mouth daily.  30 tablet  1  . aspirin 81 MG tablet Take 81 mg by mouth daily.      . citalopram (CELEXA) 40 MG tablet Take 40 mg by mouth daily.      . cloNIDine (CATAPRES) 0.3 MG tablet Take 0.3 mg by mouth 2 (two) times daily.      . ferrous sulfate 325 (65 FE) MG tablet Take 1 tablet (325 mg total) by mouth 2 (two) times daily with a meal.  60 tablet  3  . furosemide (LASIX) 20 MG tablet Take 1 tablet (20 mg total) by mouth 2 (two) times daily.  60 tablet  11  . HYDROcodone-acetaminophen (HYCET) 7.5-325 mg/15 ml solution Take 15 mLs by mouth 4 (four) times daily as needed for moderate pain.  120 mL  0  . hydrOXYzine (ATARAX/VISTARIL) 25 MG tablet Take  25 mg by mouth at bedtime as needed (sleep).      . loratadine (CLARITIN) 10 MG tablet Take 1 tablet (10 mg total) by mouth daily.  30 tablet  3  . Multiple Vitamin (MULTIVITAMIN WITH MINERALS) TABS Take 1 tablet by mouth daily.      . polyethylene glycol powder (GLYCOLAX/MIRALAX) powder Take 17 g by mouth daily as needed (constipation).       . potassium chloride (K-DUR,KLOR-CON) 10 MEQ tablet Take 1 tablet (10 mEq total) by mouth 2 (two) times daily with a meal.  60 tablet  11  . traZODone (DESYREL) 100 MG tablet Take 100 mg by mouth at bedtime.       No current facility-administered medications on file prior to visit.   Family History  Problem Relation Age of Onset  . Coronary artery disease Mother   . Diabetes Mother   . Breast cancer Maternal Grandmother    . Diabetes Maternal Grandmother   . Diabetes Father   . Hypertension Father   . Coronary artery disease Father    History   Social History  . Marital Status: Single    Spouse Name: N/A    Number of Children: 2  . Years of Education: N/A   Occupational History  . unemployed    Social History Main Topics  . Smoking status: Current Every Day Smoker -- 0.50 packs/day for 20 years    Types: Cigarettes  . Smokeless tobacco: Never Used     Comment: 5 cigs --- uses E-cig  . Alcohol Use: No     Comment: pint of wine 2-3 times a week--ocassional  . Drug Use: 1.00 per week    Special: "Crack" cocaine     Comment: trying to quit---- quit November 2013   . Sexual Activity: Yes   Other Topics Concern  . Not on file   Social History Narrative   Lives with mom, sisters and brothers.    Review of Systems  Constitutional: Negative for fever, chills, diaphoresis.  HENT: Negative for ear pain, nosebleeds, congestion, facial swelling, rhinorrhea, neck pain, neck stiffness and ear discharge.   Eyes: Negative for pain, discharge, redness, itching and visual disturbance.  Respiratory: Negative for cough, choking, shortness of breath, wheezing and stridor.   Cardiovascular: Negative for chest pain, palpitations and leg swelling.  Gastrointestinal: Negative for abdominal distention.  Genitourinary: Negative for dysuria, urgency, frequency, hematuria, flank pain, decreased urine volume, difficulty urinating and dyspareunia.  Musculoskeletal: Negative for back pain, joint swelling, arthralgias and gait problem.  Neurological: Negative for dizziness, tremors, seizures, syncope, facial asymmetry, speech difficulty, weakness, light-headedness, numbness and headaches.  Hematological: Negative for adenopathy. Does not bruise/bleed easily.  Psychiatric/Behavioral: Negative for hallucinations, behavioral problems, confusion, dysphoric mood, decreased concentration and agitation.    Objective:    Filed Vitals:   03/09/13 1701  BP: 109/73  Pulse: 100  Temp: 99.7 F (37.6 C)  Resp: 16    Physical Exam  Constitutional: Appears well-developed and well-nourished. No distress.  HENT: Normocephalic. External right and left ear normal. Oropharynx is clear and moist.  anterior cervical lymph nodes appear to be enlarged bilaterally, tender to palpation Eyes: Conjunctivae and EOM are normal. PERRLA, no scleral icterus.  Neck: Normal ROM. Neck supple. No JVD. No tracheal deviation. No thyromegaly.  CVS: RRR, S1/S2 +, no murmurs, no gallops, no carotid bruit.  Pulmonary: Effort and breath sounds normal, no stridor, rhonchi, wheezes, rales.  Abdominal: Soft. BS +,  no distension, tenderness, rebound or  guarding.  Musculoskeletal: Normal range of motion. No edema and no tenderness.  Lymphadenopathy: No lymphadenopathy noted, cervical, inguinal. Neuro: Alert. Normal reflexes, muscle tone coordination. No cranial nerve deficit. Skin: Skin is warm and dry. No rash noted. Not diaphoretic. No erythema. No pallor.  Psychiatric: Normal mood and affect. Behavior, judgment, thought content normal.   Lab Results  Component Value Date   WBC 4.5 03/09/2013   HGB 8.6* 03/09/2013   HCT 25.1* 03/09/2013   MCV 73.6* 03/09/2013   PLT 359 03/09/2013   Lab Results  Component Value Date   CREATININE 1.52* 03/09/2013   BUN 15 03/09/2013   NA 130* 03/09/2013   K 4.4 03/09/2013   CL 100 03/09/2013   CO2 22 03/09/2013    Lab Results  Component Value Date   HGBA1C 5.5 04/06/2012   Lipid Panel     Component Value Date/Time   CHOL 162 04/07/2012 0221   TRIG 100 04/07/2012 0221   HDL 45 04/07/2012 0221   CHOLHDL 3.6 04/07/2012 0221   VLDL 20 04/07/2012 0221   LDLCALC 97 04/07/2012 0221       Assessment and plan:   Patient Active Problem List   Diagnosis Date Noted  .  generalized malaise  -  unclear etiology and possibly related to HIV. HIV test reactivity but viral load undetected. Will  obtain blood work today CBC to ensure that white blood cell count is stable. We'll check CD4 count. We'll provide referral to hematology and infectious disease specialist for further evaluation.  09/14/2012  .  leukopenia  - possibly related to HIV but clear etiology not established. Will repeat blood tests today  09/14/2012  .  acute renal failure  - possibly baseline at this point and creatinine 1.5. We'll repeat electrolyte panel today  08/12/2012  .  HIV test reactivity but viral load undetected. Repeat HIV to ensure her positive  08/12/2012

## 2013-03-21 ENCOUNTER — Other Ambulatory Visit: Payer: Self-pay | Admitting: Hematology and Oncology

## 2013-03-21 ENCOUNTER — Encounter: Payer: Self-pay | Admitting: Hematology and Oncology

## 2013-03-21 ENCOUNTER — Other Ambulatory Visit: Payer: Self-pay | Admitting: Cardiology

## 2013-03-21 ENCOUNTER — Ambulatory Visit (HOSPITAL_BASED_OUTPATIENT_CLINIC_OR_DEPARTMENT_OTHER): Payer: No Typology Code available for payment source | Admitting: Hematology and Oncology

## 2013-03-21 ENCOUNTER — Telehealth: Payer: Self-pay | Admitting: Hematology and Oncology

## 2013-03-21 ENCOUNTER — Other Ambulatory Visit (HOSPITAL_COMMUNITY)
Admission: RE | Admit: 2013-03-21 | Discharge: 2013-03-21 | Disposition: A | Discharge status: Home / Self Care | Payer: No Typology Code available for payment source | Source: Ambulatory Visit | Attending: Hematology and Oncology | Admitting: Hematology and Oncology

## 2013-03-21 ENCOUNTER — Ambulatory Visit (HOSPITAL_BASED_OUTPATIENT_CLINIC_OR_DEPARTMENT_OTHER): Payer: No Typology Code available for payment source

## 2013-03-21 ENCOUNTER — Ambulatory Visit: Payer: Self-pay

## 2013-03-21 VITALS — BP 108/83 | HR 98 | Temp 98.2°F | Resp 20 | Ht 66.0 in | Wt 187.2 lb

## 2013-03-21 DIAGNOSIS — D649 Anemia, unspecified: Secondary | ICD-10-CM

## 2013-03-21 DIAGNOSIS — R591 Generalized enlarged lymph nodes: Secondary | ICD-10-CM

## 2013-03-21 DIAGNOSIS — D709 Neutropenia, unspecified: Secondary | ICD-10-CM

## 2013-03-21 DIAGNOSIS — D72819 Decreased white blood cell count, unspecified: Secondary | ICD-10-CM

## 2013-03-21 DIAGNOSIS — D0510 Intraductal carcinoma in situ of unspecified breast: Secondary | ICD-10-CM | POA: Insufficient documentation

## 2013-03-21 DIAGNOSIS — R599 Enlarged lymph nodes, unspecified: Secondary | ICD-10-CM

## 2013-03-21 DIAGNOSIS — F1910 Other psychoactive substance abuse, uncomplicated: Secondary | ICD-10-CM

## 2013-03-21 DIAGNOSIS — D0512 Intraductal carcinoma in situ of left breast: Secondary | ICD-10-CM

## 2013-03-21 HISTORY — DX: Decreased white blood cell count, unspecified: D72.819

## 2013-03-21 HISTORY — DX: Generalized enlarged lymph nodes: R59.1

## 2013-03-21 LAB — FERRITIN CHCC: Ferritin: 808 ng/ml — ABNORMAL HIGH (ref 9–269)

## 2013-03-21 LAB — COMPREHENSIVE METABOLIC PANEL (CC13)
AST: 13 U/L (ref 5–34)
Alkaline Phosphatase: 84 U/L (ref 40–150)
Anion Gap: 11 mEq/L (ref 3–11)
BUN: 11.2 mg/dL (ref 7.0–26.0)
CO2: 20 mEq/L — ABNORMAL LOW (ref 22–29)
Calcium: 9.5 mg/dL (ref 8.4–10.4)
Chloride: 105 mEq/L (ref 98–109)
Creatinine: 1 mg/dL (ref 0.6–1.1)

## 2013-03-21 LAB — CBC & DIFF AND RETIC
BASO%: 1.7 % (ref 0.0–2.0)
EOS%: 1.7 % (ref 0.0–7.0)
HCT: 28.7 % — ABNORMAL LOW (ref 34.8–46.6)
LYMPH%: 70 % — ABNORMAL HIGH (ref 14.0–49.7)
MCH: 24.5 pg — ABNORMAL LOW (ref 25.1–34.0)
MCHC: 32.1 g/dL (ref 31.5–36.0)
MCV: 76.5 fL — ABNORMAL LOW (ref 79.5–101.0)
NEUT%: 2.1 % — ABNORMAL LOW (ref 38.4–76.8)
RBC: 3.75 10*6/uL (ref 3.70–5.45)
RDW: 17 % — ABNORMAL HIGH (ref 11.2–14.5)
Retic Ct Abs: 55.88 10*3/uL (ref 33.70–90.70)
lymph#: 1.6 10*3/uL (ref 0.9–3.3)
nRBC: 0 % (ref 0–0)

## 2013-03-21 LAB — TECHNOLOGIST REVIEW

## 2013-03-21 LAB — IRON AND TIBC CHCC
%SAT: 29 % (ref 21–57)
Iron: 54 ug/dL (ref 41–142)

## 2013-03-21 LAB — CHCC SMEAR

## 2013-03-21 LAB — HOLD TUBE, BLOOD BANK

## 2013-03-21 MED ORDER — AMOXICILLIN-POT CLAVULANATE 875-125 MG PO TABS: 1.0000 | ORAL_TABLET | Freq: Two times a day (BID) | ORAL | Status: DC

## 2013-03-21 NOTE — Telephone Encounter (Signed)
gv and printed appt sched and avs for pt for Jan..Marland KitchenPt sched to See dr. [name] on Jan 12th @ 2:40pm

## 2013-03-21 NOTE — Progress Notes (Signed)
[facility] CONSULT NOTE  Patient Care Team: Jeanann Lewandowsky, MD as PCP - General (Internal Medicine)  CHIEF COMPLAINTS/PURPOSE OF CONSULTATION:  Leukopenia, anemia, diffuse lymphadenopathy  HISTORY OF PRESENTING ILLNESS:  Morgan Bean 47 y.o. female is here because of abnormal CBC and diffuse lymphadenopathy A review of her records extensively. The patient herself is a poor historian. This patient had background history of polysubstance abuse and recently was found to have positive reactive HIV antibody. She is currently undergoing further evaluation and was told that she does not have HIV infection. She started to feel some enlarged lymph node in her neck over the past 4 months that comes and goes but over the past month or so it did not go away. The patient also have anorexia and 10 pounds weight loss. She also have regular drenching night sweats, occasional fevers and chills. She denies any sore throat or nasal drainage. She had history of chronic constipation twice a week but recently had no bowel movement for 2 weeks. She had history of polysubstance abuse but has not used any drugs recently  MEDICAL HISTORY:  Past Medical History  Diagnosis Date  . Coronary artery disease     Lexiscan myoview (10/12) with significant ST depression upon Lexiscan injection but no ischemia or infarction on perfusion images. Left heart cath (10/12): 30% mid LAD, 30% ostial D1, 50% ostial D2.    . Stroke   . Hypertension   . Tricuspid regurgitation   . Iron deficiency     hx of  . Polysubstance abuse     Prior cocaine  . Tobacco abuse   . Leukocytoclastic vasculitis     Rash across lower body, occurred in 9/11, diagnosed by skin biopsy. ANA positive. Thought to be secondary to cocaine use.  . Pulmonary HTN     Echo (9/11) with EF 65%, mild LVH, mild AI, mild MR, moderate to possibly severe TR with PA systolic pressure 52 mmHg. Echo (10/12): severe LV hypertrophy, EF 60-65%, mild  MR, mild AI, moderate to severe tricuspid regurgitation, PA systolic pressure 38 mmHg.  Echo 4/14: moderate LVH, EF 65%, normal wall motion, diastolic dysfunction, mild AI, mild MR  . Asthma   . Shortness of breath     a. PFTs 5/14:  FEV1/FVC 99% predicted; FVC 60% predicted; DLCO mildly reduced, mild restriction, air trapping.;  b.  seen by pulmo (Dr. Shelle Iron) 5/14: mainly upper airway symptoms - ACE d/c'd (sx's better)  . Chronic diastolic CHF (congestive heart failure)   . Constipation   . Lymphadenopathy 03/21/2013  . Leukopenia 03/21/2013    SURGICAL HISTORY: Past Surgical History  Procedure Laterality Date  . Ankle surgery      right  . Cardiac catheterization    . I&d extremity  08/09/2011    Procedure: IRRIGATION AND DEBRIDEMENT EXTREMITY;  Surgeon: Robyne Askew, MD;  Location: [facility] OR;  Service: General;  Laterality: Left;  i & D Left axilla abscess    SOCIAL HISTORY: History   Social History  . Marital Status: Single    Spouse Name: N/A    Number of Children: 2  . Years of Education: N/A   Occupational History  . unemployed    Social History Main Topics  . Smoking status: Current Every Day Smoker -- 0.25 packs/day for 30 years    Types: Cigarettes  . Smokeless tobacco: Never Used     Comment: 5 cigs --- uses E-cig  . Alcohol Use: No  Comment: pint of wine 2-3 times a week--ocassional  . Drug Use: 1.00 per week    Special: "Crack" cocaine     Comment: trying to quit---- quit November 2013   . Sexual Activity: Yes   Other Topics Concern  . Not on file   Social History Narrative   Lives with mom, sisters and brothers.    FAMILY HISTORY: Family History  Problem Relation Age of Onset  . Coronary artery disease Mother   . Diabetes Mother   . Breast cancer Maternal Grandmother   . Diabetes Maternal Grandmother   . Diabetes Father   . Hypertension Father   . Coronary artery disease Father     ALLERGIES:  is allergic to ciprofloxacin.  MEDICATIONS:   Current Outpatient Prescriptions  Medication Sig Dispense Refill  . albuterol (PROVENTIL HFA;VENTOLIN HFA) 108 (90 BASE) MCG/ACT inhaler Inhale 2 puffs into the lungs every 4 (four) hours as needed for wheezing or shortness of breath.      [name] amLODipine (NORVASC) 10 MG tablet Take 1 tablet (10 mg total) by mouth daily.  30 tablet  1  . aspirin 81 MG tablet Take 81 mg by mouth daily.      . citalopram (CELEXA) 40 MG tablet Take 40 mg by mouth daily.      . cloNIDine (CATAPRES) 0.3 MG tablet Take 0.3 mg by mouth 2 (two) times daily.      . ferrous sulfate 325 (65 FE) MG tablet Take 1 tablet (325 mg total) by mouth 2 (two) times daily with a meal.  60 tablet  3  . furosemide (LASIX) 20 MG tablet Take 1 tablet (20 mg total) by mouth 2 (two) times daily.  60 tablet  11  . hydrOXYzine (ATARAX/VISTARIL) 25 MG tablet Take 25 mg by mouth at bedtime as needed (sleep).      . loratadine (CLARITIN) 10 MG tablet Take 1 tablet (10 mg total) by mouth daily.  30 tablet  3  . Multiple Vitamin (MULTIVITAMIN WITH MINERALS) TABS Take 1 tablet by mouth daily.      . polyethylene glycol powder (GLYCOLAX/MIRALAX) powder Take 17 g by mouth daily as needed (constipation).       . potassium chloride (K-DUR,KLOR-CON) 10 MEQ tablet Take 1 tablet (10 mEq total) by mouth 2 (two) times daily with a meal.  60 tablet  11  . traZODone (DESYREL) 100 MG tablet Take 100 mg by mouth at bedtime.      [name] amoxicillin-clavulanate (AUGMENTIN) 875-125 MG per tablet Take 1 tablet by mouth 2 (two) times daily.  20 tablet  0  . [START ON 03/22/2013] buPROPion (WELLBUTRIN) 100 MG tablet Take 100 mg by mouth 2 (two) times daily.       No current facility-administered medications for this visit.    REVIEW OF SYSTEMS:   Eyes: Denies blurriness of vision, double vision or watery eyes Ears, nose, mouth, throat, and face: Denies mucositis or sore throat Respiratory: Denies cough, dyspnea or wheezes Cardiovascular: Denies palpitation, chest  discomfort or lower extremity swelling Skin: Denies abnormal skin rashes Neurological:Denies numbness, tingling or new weaknesses Behavioral/Psych: Mood is stable, no new changes  All other systems were reviewed with the patient and are negative.  PHYSICAL EXAMINATION: ECOG PERFORMANCE STATUS: 1 - Symptomatic but completely ambulatory  Filed Vitals:   03/21/13 1117  BP: 108/83  Pulse: 98  Temp: 98.2 F (36.8 C)  Resp: 20   Filed Weights   03/21/13 1117  Weight: 187 lb 3.2  oz (84.913 kg)    GENERAL:alert, no distress and comfortable. SKIN: skin color is pale, texture, turgor are normal, no rashes or significant lesions EYES: normal, conjunctiva are pale and non-injected, sclera clear OROPHARYNX:no exudate, no erythema and lips, buccal mucosa, and tongue normal  NECK: supple, thyroid normal size, non-tender, without nodularity LYMPH:  Palpable lymphadenopathy on both sides of her neck but non-in the axilla all inguinal regions LUNGS: clear to auscultation and percussion with normal breathing effort HEART: regular rate & rhythm and no murmurs and no lower extremity edema ABDOMEN:abdomen soft, non-tender and normal bowel sounds no palpable splenomegaly Musculoskeletal:no cyanosis of digits and no clubbing  PSYCH: alert & oriented x 3 with fluent speech NEURO: no focal motor/sensory deficits  LABORATORY DATA:  I have reviewed the data as listed Lab Results  Component Value Date   WBC 2.3* 03/21/2013   HGB 9.2* 03/21/2013   HCT 28.7* 03/21/2013   MCV 76.5* 03/21/2013   PLT 269 03/21/2013   Lab Results  Component Value Date   NA 135* 03/21/2013   K 4.0 03/21/2013   CL 100 03/09/2013   CO2 20* 03/21/2013   I reviewed her peripheral smear and concluded there is no evidence of her blasts. She has absolute leukopenia and neutropenia. The morphology of her white blood cells were within normal limits. All her white blood cells are lymphocytes and monocytes. She had normal  platelets. The morphology of her red blood cells were within normal limits  RADIOGRAPHIC STUDIES: I reviewed her most recent CT scan and agree with the interpretation I have personally reviewed the radiological images as listed and agreed with the findings in the report.   ASSESSMENT:  #1 severe leukopenia and neutropenia #2 anemia #3 diffuse lymphadenopathy #4 questionable HIV infection  PLAN:  #1 severe leukopenia and neutropenia Causes unknown. Certainly acute viral infection such as HIV can cause this. The patient had recent fever. I'm starting her on Augmentin twice a day pending further test results. I will see her back next week #2 anemia The microcytosis suggesting iron deficiency anemia. I will recheck iron panel. She will continue iron supplement for now #3 diffuse lymphadenopathy I am concerned about possible diagnosis of lymphoma. I will order a PET/CT scan to stage her disease I am also referring her to ENT for biopsy I will present her case in the next ENT tumor board next week. #4 questionable HIV infection I will redraw HIV antibody panel #5 severe constipation I recommend she take laxative with Miralax daily #6 polysubstance abuse The patient admitted to history of cocaine use. She has not used cocaine recently. I spent some time counseling the patient about the importance of nicotine cessation and she will try to quit on her own  Orders Placed This Encounter  Procedures  . NM PET Image Initial (PI) Skull Base To Thigh    Standing Status: Future     Number of Occurrences:      Standing Expiration Date: 05/21/2014    Order Specific Question:  Reason for Exam (SYMPTOM  OR DIAGNOSIS REQUIRED)    Answer:  staging lymphoma    Order Specific Question:  Is the patient pregnant?    Answer:  No    Order Specific Question:  Preferred imaging location?    Answer:  [facility]  . CBC & Diff and Retic    Standing Status: Future     Number of Occurrences: 1      Standing Expiration Date: 03/21/2014  .  Comprehensive metabolic panel    Standing Status: Future     Number of Occurrences: 1     Standing Expiration Date: 03/21/2014  . Lactate dehydrogenase    Standing Status: Future     Number of Occurrences: 1     Standing Expiration Date: 03/21/2014  . Hepatitis B surface antigen    Standing Status: Future     Number of Occurrences: 1     Standing Expiration Date: 03/21/2014  . Hepatitis B core antibody, IgM    Standing Status: Future     Number of Occurrences: 1     Standing Expiration Date: 03/21/2014  . Hepatitis B surface antibody    Standing Status: Future     Number of Occurrences: 1     Standing Expiration Date: 03/21/2014  . HIV antibody    Standing Status: Future     Number of Occurrences: 1     Standing Expiration Date: 03/21/2014  . Iron and TIBC    Standing Status: Future     Number of Occurrences: 1     Standing Expiration Date: 03/21/2014  . Ferritin    Standing Status: Future     Number of Occurrences: 1     Standing Expiration Date: 03/21/2014  . Sedimentation rate    Standing Status: Future     Number of Occurrences: 1     Standing Expiration Date: 03/21/2014  . Smear    Standing Status: Future     Number of Occurrences: 1     Standing Expiration Date: 03/21/2014  . Ambulatory referral to ENT    Referral Priority:  Routine    Referral Type:  Consultation    Referral Reason:  Specialty Services Required    Referred to Provider:  Flo Shanks, MD    Requested Specialty:  Otolaryngology    Number of Visits Requested:  1  . Hold Tube, Blood Bank    Standing Status: Future     Number of Occurrences: 1     Standing Expiration Date: 03/21/2014  . Direct antiglobulin test    Standing Status: Future     Number of Occurrences: 1     Standing Expiration Date: 03/21/2014    All questions were answered. The patient knows to call the clinic with any problems, questions or concerns.   [facility], Marlaine Arey, MD 03/21/2013 1:46  PM

## 2013-03-21 NOTE — Telephone Encounter (Signed)
Morgan Bean, please see 09/27/12 office visit. Not sure if the patient supposed to be taking the amlodipine. Please advise. Thanks, MI

## 2013-03-22 LAB — SEDIMENTATION RATE: Sed Rate: 126 mm/hr — ABNORMAL HIGH (ref 0–22)

## 2013-03-22 LAB — HEPATITIS B CORE ANTIBODY, IGM: Hep B C IgM: NONREACTIVE

## 2013-03-22 LAB — HEPATITIS B SURFACE ANTIGEN: Hepatitis B Surface Ag: NEGATIVE

## 2013-03-22 LAB — DIRECT ANTIGLOBULIN TEST (NOT AT ARMC)
DAT (Complement): NEGATIVE
DAT IgG: NEGATIVE

## 2013-03-22 LAB — HIV ANTIBODY (ROUTINE TESTING W REFLEX): HIV: NONREACTIVE

## 2013-03-24 LAB — FLOW CYTOMETRY

## 2013-03-29 ENCOUNTER — Other Ambulatory Visit (HOSPITAL_BASED_OUTPATIENT_CLINIC_OR_DEPARTMENT_OTHER): Payer: No Typology Code available for payment source

## 2013-03-29 ENCOUNTER — Encounter (HOSPITAL_COMMUNITY): Payer: Self-pay

## 2013-03-29 ENCOUNTER — Telehealth: Payer: Self-pay | Admitting: Hematology and Oncology

## 2013-03-29 ENCOUNTER — Ambulatory Visit (HOSPITAL_BASED_OUTPATIENT_CLINIC_OR_DEPARTMENT_OTHER): Payer: No Typology Code available for payment source | Admitting: Hematology and Oncology

## 2013-03-29 ENCOUNTER — Ambulatory Visit (HOSPITAL_COMMUNITY)
Admission: RE | Admit: 2013-03-29 | Discharge: 2013-03-29 | Disposition: A | Discharge status: Home / Self Care | Payer: No Typology Code available for payment source | Source: Ambulatory Visit | Attending: Hematology and Oncology | Admitting: Hematology and Oncology

## 2013-03-29 ENCOUNTER — Encounter: Payer: Self-pay | Admitting: Hematology and Oncology

## 2013-03-29 ENCOUNTER — Other Ambulatory Visit: Payer: Self-pay | Admitting: Hematology and Oncology

## 2013-03-29 VITALS — BP 129/84 | HR 88 | Temp 97.7°F | Resp 18 | Ht 66.0 in | Wt 189.1 lb

## 2013-03-29 DIAGNOSIS — R599 Enlarged lymph nodes, unspecified: Secondary | ICD-10-CM

## 2013-03-29 DIAGNOSIS — N859 Noninflammatory disorder of uterus, unspecified: Secondary | ICD-10-CM | POA: Insufficient documentation

## 2013-03-29 DIAGNOSIS — R591 Generalized enlarged lymph nodes: Secondary | ICD-10-CM

## 2013-03-29 DIAGNOSIS — I517 Cardiomegaly: Secondary | ICD-10-CM | POA: Insufficient documentation

## 2013-03-29 DIAGNOSIS — D72819 Decreased white blood cell count, unspecified: Secondary | ICD-10-CM

## 2013-03-29 DIAGNOSIS — N2889 Other specified disorders of kidney and ureter: Secondary | ICD-10-CM | POA: Insufficient documentation

## 2013-03-29 DIAGNOSIS — F17200 Nicotine dependence, unspecified, uncomplicated: Secondary | ICD-10-CM

## 2013-03-29 DIAGNOSIS — D72820 Lymphocytosis (symptomatic): Secondary | ICD-10-CM

## 2013-03-29 DIAGNOSIS — D539 Nutritional anemia, unspecified: Secondary | ICD-10-CM

## 2013-03-29 DIAGNOSIS — N269 Renal sclerosis, unspecified: Secondary | ICD-10-CM | POA: Insufficient documentation

## 2013-03-29 DIAGNOSIS — I700 Atherosclerosis of aorta: Secondary | ICD-10-CM | POA: Insufficient documentation

## 2013-03-29 HISTORY — DX: Nutritional anemia, unspecified: D53.9

## 2013-03-29 LAB — COMPREHENSIVE METABOLIC PANEL (CC13)
ALK PHOS: 81 U/L (ref 40–150)
ALT: 7 U/L (ref 0–55)
ANION GAP: 8 meq/L (ref 3–11)
AST: 13 U/L (ref 5–34)
Albumin: 2.9 g/dL — ABNORMAL LOW (ref 3.5–5.0)
BILIRUBIN TOTAL: 0.3 mg/dL (ref 0.20–1.20)
BUN: 7.5 mg/dL (ref 7.0–26.0)
CO2: 23 meq/L (ref 22–29)
CREATININE: 1 mg/dL (ref 0.6–1.1)
Calcium: 9.5 mg/dL (ref 8.4–10.4)
Chloride: 105 mEq/L (ref 98–109)
GLUCOSE: 93 mg/dL (ref 70–140)
Potassium: 4.3 mEq/L (ref 3.5–5.1)
Sodium: 136 mEq/L (ref 136–145)
Total Protein: 9.5 g/dL — ABNORMAL HIGH (ref 6.4–8.3)

## 2013-03-29 LAB — CBC & DIFF AND RETIC
BASO%: 0.8 % (ref 0.0–2.0)
Basophils Absolute: 0 10*3/uL (ref 0.0–0.1)
EOS%: 1.3 % (ref 0.0–7.0)
Eosinophils Absolute: 0 10*3/uL (ref 0.0–0.5)
HEMATOCRIT: 26.4 % — AB (ref 34.8–46.6)
HGB: 8.5 g/dL — ABNORMAL LOW (ref 11.6–15.9)
Immature Retic Fract: 7.4 % (ref 1.60–10.00)
LYMPH%: 73.1 % — AB (ref 14.0–49.7)
MCH: 24.6 pg — ABNORMAL LOW (ref 25.1–34.0)
MCHC: 32.2 g/dL (ref 31.5–36.0)
MCV: 76.3 fL — ABNORMAL LOW (ref 79.5–101.0)
MONO#: 0.5 10*3/uL (ref 0.1–0.9)
MONO%: 19.3 % — ABNORMAL HIGH (ref 0.0–14.0)
NEUT#: 0.1 10*3/uL — CL (ref 1.5–6.5)
NEUT%: 5.5 % — AB (ref 38.4–76.8)
PLATELETS: 188 10*3/uL (ref 145–400)
RBC: 3.46 10*6/uL — ABNORMAL LOW (ref 3.70–5.45)
RDW: 18.1 % — ABNORMAL HIGH (ref 11.2–14.5)
RETIC %: 1.17 % (ref 0.70–2.10)
Retic Ct Abs: 40.48 10*3/uL (ref 33.70–90.70)
WBC: 2.4 10*3/uL — AB (ref 3.9–10.3)
lymph#: 1.7 10*3/uL (ref 0.9–3.3)

## 2013-03-29 LAB — MORPHOLOGY: PLT EST: ADEQUATE

## 2013-03-29 LAB — GLUCOSE, CAPILLARY: GLUCOSE-CAPILLARY: 85 mg/dL (ref 70–99)

## 2013-03-29 MED ORDER — FLUDEOXYGLUCOSE F - 18 (FDG) INJECTION
17.7000 | Freq: Once | INTRAVENOUS | Status: AC | PRN
  Administered 2013-03-29: 17.7 via INTRAVENOUS

## 2013-03-29 NOTE — Telephone Encounter (Signed)
gv and printed appt sched an davs for pt for Jan.. °

## 2013-03-29 NOTE — Progress Notes (Signed)
[name] OFFICE PROGRESS NOTE  Patient Care Team: [name], MD as PCP - General (Internal Medicine)  DIAGNOSIS: Lymphadenopathy, pancytopenia, suspect lymphoma  SUMMARY OF ONCOLOGIC HISTORY: [name] was being referred here because of abnormal CBC and lymphadenopathy  INTERVAL HISTORY: Morgan Bean 48 y.o. female returns for further followup. She still complaining of fatigue. The patient denies any recent signs or symptoms of bleeding such as spontaneous epistaxis, hematuria or hematochezia. She denies any cough. She is attempting to quit smoking  I have reviewed the past medical history, past surgical history, social history and family history with the patient and they are unchanged from previous note.  ALLERGIES:  is allergic to ciprofloxacin.  MEDICATIONS:  Current Outpatient Prescriptions  Medication Sig Dispense Refill  . albuterol (PROVENTIL HFA;VENTOLIN HFA) 108 (90 BASE) MCG/ACT inhaler Inhale 2 puffs into the lungs every 4 (four) hours as needed for wheezing or shortness of breath.      [name] amLODipine (NORVASC) 10 MG tablet TAKE ONE TABLET DAILY  30 tablet  0  . amoxicillin-clavulanate (AUGMENTIN) 875-125 MG per tablet Take 1 tablet by mouth 2 (two) times daily.  20 tablet  0  . aspirin 81 MG tablet Take 81 mg by mouth daily.      [name] buPROPion (WELLBUTRIN) 100 MG tablet Take 100 mg by mouth 2 (two) times daily.      . citalopram (CELEXA) 40 MG tablet Take 40 mg by mouth daily.      . cloNIDine (CATAPRES) 0.3 MG tablet Take 0.3 mg by mouth 2 (two) times daily.      . ferrous sulfate 325 (65 FE) MG tablet Take 1 tablet (325 mg total) by mouth 2 (two) times daily with a meal.  60 tablet  3  . furosemide (LASIX) 20 MG tablet Take 1 tablet (20 mg total) by mouth 2 (two) times daily.  60 tablet  11  . hydrOXYzine (ATARAX/VISTARIL) 25 MG tablet Take 25 mg by mouth at bedtime as needed (sleep).      . loratadine (CLARITIN) 10 MG  tablet Take 1 tablet (10 mg total) by mouth daily.  30 tablet  3  . Multiple Vitamin (MULTIVITAMIN WITH MINERALS) TABS Take 1 tablet by mouth daily.      . polyethylene glycol powder (GLYCOLAX/MIRALAX) powder Take 17 g by mouth daily as needed (constipation).       . potassium chloride (K-DUR,KLOR-CON) 10 MEQ tablet Take 1 tablet (10 mEq total) by mouth 2 (two) times daily with a meal.  60 tablet  11  . traZODone (DESYREL) 100 MG tablet Take 100 mg by mouth at bedtime.       No current facility-administered medications for this visit.    REVIEW OF SYSTEMS:   Constitutional: Denies fevers, chills or abnormal weight loss Behavioral/Psych: Mood is stable, no new changes  All other systems were reviewed with the patient and are negative.  PHYSICAL EXAMINATION: ECOG PERFORMANCE STATUS: 1 - Symptomatic but completely ambulatory  Filed Vitals:   03/29/13 1530  BP: 129/84  Pulse: 88  Temp: 97.7 F (36.5 C)  Resp: 18   Filed Weights   03/29/13 1530  Weight: 189 lb 1.6 oz (85.775 kg)    GENERAL:alert, no distress and comfortable NECK: supple, thyroid normal size, non-tender, without nodularity. Palpable lymphadenopathy in the neck LYMPH:  no palpable lymphadenopathy in the cervical, axillary or inguinal NEURO: alert & oriented x 3 with fluent speech, no focal motor/sensory deficits  LABORATORY DATA:  I have reviewed the data as listed    Component Value Date/Time   NA 135* 03/21/2013 1206   NA 130* 03/09/2013 1737   K 4.0 03/21/2013 1206   K 4.4 03/09/2013 1737   CL 100 03/09/2013 1737   CO2 20* 03/21/2013 1206   CO2 22 03/09/2013 1737   GLUCOSE 91 03/21/2013 1206   GLUCOSE 107* 03/09/2013 1737   BUN 11.2 03/21/2013 1206   BUN 15 03/09/2013 1737   CREATININE 1.0 03/21/2013 1206   CREATININE 1.52* 03/09/2013 1737   CREATININE 1.29* 02/15/2013 1954   CALCIUM 9.5 03/21/2013 1206   CALCIUM 8.9 03/09/2013 1737   PROT 10.1* 03/21/2013 1206   PROT 9.0* 03/09/2013 1737    ALBUMIN 2.7* 03/21/2013 1206   ALBUMIN 3.0* 03/09/2013 1737   AST 13 03/21/2013 1206   AST 28 03/09/2013 1737   ALT 11 03/21/2013 1206   ALT 20 03/09/2013 1737   ALKPHOS 84 03/21/2013 1206   ALKPHOS 70 03/09/2013 1737   BILITOT 0.26 03/21/2013 1206   BILITOT 0.5 03/09/2013 1737   GFRNONAA 48* 02/15/2013 1954   GFRAA 55* 02/15/2013 1954    No results found for this basename: SPEP, UPEP,  kappa and lambda light chains    Lab Results  Component Value Date   WBC 2.4* 03/29/2013   NEUTROABS 0.1* 03/29/2013   HGB 8.5* 03/29/2013   HCT 26.4* 03/29/2013   MCV 76.3* 03/29/2013   PLT 188 03/29/2013      Chemistry      Component Value Date/Time   NA 135* 03/21/2013 1206   NA 130* 03/09/2013 1737   K 4.0 03/21/2013 1206   K 4.4 03/09/2013 1737   CL 100 03/09/2013 1737   CO2 20* 03/21/2013 1206   CO2 22 03/09/2013 1737   BUN 11.2 03/21/2013 1206   BUN 15 03/09/2013 1737   CREATININE 1.0 03/21/2013 1206   CREATININE 1.52* 03/09/2013 1737   CREATININE 1.29* 02/15/2013 1954      Component Value Date/Time   CALCIUM 9.5 03/21/2013 1206   CALCIUM 8.9 03/09/2013 1737   ALKPHOS 84 03/21/2013 1206   ALKPHOS 70 03/09/2013 1737   AST 13 03/21/2013 1206   AST 28 03/09/2013 1737   ALT 11 03/21/2013 1206   ALT 20 03/09/2013 1737   BILITOT 0.26 03/21/2013 1206   BILITOT 0.5 03/09/2013 1737     RADIOGRAPHIC STUDIES: PET/CT scan imaging was done. Report is not released but there are abnormalities in the neck in the spleen suspicious for lymphoma I have personally reviewed the radiological images as listed   ASSESSMENT & PLAN:  #1 lymphadenopathy I suspect lymphoma. I presented her case at the tumor board today and she'll see ENT next week for lymph node biopsy #2 anemia This is anemia chronic disease. She may need a bone marrow biopsy in the future. This is likely anemia of chronic disease. The patient denies recent history of bleeding such as epistaxis, hematuria or hematochezia. She is  asymptomatic from the anemia. We will observe for now.  She does not require transfusion now.  #3 lymphocytosis I will order T-cell rearrangement study with the next visit. #4 tobacco abuse I spent some time educating the patient's about the importance of nicotine cessation. She is attempting to quit. Orders Placed This Encounter  Procedures  . CBC & Diff and Retic    Standing Status: Standing     Number of Occurrences: 9     Standing Expiration Date: 03/29/2014  .  Comprehensive metabolic panel    Standing Status: Standing     Number of Occurrences: 9     Standing Expiration Date: 03/29/2014  . Francee Nodal Test    Standing Status: Future     Number of Occurrences:      Standing Expiration Date: 03/29/2014    Order Specific Question:  Test Name:    Answer:  T cell rearrangement study  . Vitamin B12    Standing Status: Future     Number of Occurrences:      Standing Expiration Date: 03/29/2014  . Hold Tube, Blood Bank    Standing Status: Standing     Number of Occurrences: 9     Standing Expiration Date: 03/29/2014   All questions were answered. The patient knows to call the clinic with any problems, questions or concerns. No barriers to learning was detected. I spent 25 minutes counseling the patient face to face. The total time spent in the appointment was 40 minutes and more than 50% was on counseling and review of test results     [facility], [place], MD 03/29/2013 3:54 PM

## 2013-04-03 ENCOUNTER — Encounter (HOSPITAL_COMMUNITY): Payer: Self-pay | Admitting: Pharmacy Technician

## 2013-04-04 ENCOUNTER — Ambulatory Visit: Payer: Self-pay | Admitting: Hematology and Oncology

## 2013-04-04 ENCOUNTER — Encounter (HOSPITAL_COMMUNITY): Payer: Self-pay | Admitting: *Deleted

## 2013-04-04 ENCOUNTER — Other Ambulatory Visit: Payer: Self-pay | Admitting: Otolaryngology

## 2013-04-04 NOTE — H&P (Signed)
Morgan Bean,  Morgan Bean 47 y.o., female [id number]     Chief Complaint: Lymphadenopathy  HPI: 47-year-old black female is sent over for evaluation of persistent lymphadenopathy.  She claims she has had up-and-down swelling of her next for several years.  She had some adenopathy removed from her LEFT axilla which was reportedly benign, last year.  She has a single HIV positive test and multiple negative tests.  She is presumed HIV negative.  She has had some pancytopenia.  She has lost 11 pounds.  She does have chills and night sweats.  A recent patch last CT scan showed mildly positive adenopathy in both next, and a possible spleen abnormality.  A prior CT scan in 2014 actually showed larger lymph nodes.  Medical oncology is concerned about the possibility of lymphoma and requests lymph node biopsy.     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  She was unaware of this and has no symptoms.  PMH: Past Medical History  Diagnosis Date  . Coronary artery disease     Lexiscan myoview (10/12) with significant ST depression upon Lexiscan injection but no ischemia or infarction on perfusion images. Left heart cath (10/12): 30% mid LAD, 30% ostial D1, 50% ostial D2.    . Hypertension   . Tricuspid regurgitation   . Iron deficiency     hx of  . Polysubstance abuse     Prior cocaine  . Tobacco abuse   . Leukocytoclastic vasculitis     Rash across lower body, occurred in 9/11, diagnosed by skin biopsy. ANA positive. Thought to be secondary to cocaine use.  . Pulmonary HTN     Echo (9/11) with EF 65%, mild LVH, mild AI, mild MR, moderate to possibly severe TR with PA systolic pressure 52 mmHg. Echo (10/12): severe LV hypertrophy, EF 60-65%, mild MR, mild AI, moderate to severe tricuspid regurgitation, PA systolic pressure 38 mmHg.  Echo 4/14: moderate LVH, EF 65%, normal wall motion, diastolic dysfunction, mild AI, mild MR  . Asthma   .  Shortness of breath     a. PFTs 5/14:  FEV1/FVC 99% predicted; FVC 60% predicted; DLCO mildly reduced, mild restriction, air trapping.;  b.  seen by pulmo (Dr. Clance) 5/14: mainly upper airway symptoms - ACE d/c'd (sx's better)  . Chronic diastolic CHF (congestive heart failure)   . Constipation   . Lymphadenopathy 03/21/2013  . Leukopenia 03/21/2013  . Unspecified deficiency anemia 03/29/2013  . Anginal pain     none in past year  . Anxiety   . Mental disorder   . Depression   . Stroke 2010ish  . Arthritis   . Pancytopenia   . Hyperlipemia     Surg Hx: Past Surgical History  Procedure Laterality Date  . Ankle surgery      right  . Cardiac catheterization    . I&d extremity  08/09/2011    Procedure: IRRIGATION AND DEBRIDEMENT EXTREMITY;  Surgeon: Paul S Toth III, MD;  Location: [facility] OR;  Service: General;  Laterality: Left;  i & D Left axilla abscess    FHx:   Family History  Problem Relation Age of Onset  . Coronary artery disease Mother   . Diabetes Mother   . Breast cancer Maternal Grandmother   . Diabetes Maternal Grandmother   . Diabetes Father   . Hypertension Father   . Coronary artery disease Father    SocHx:  reports that she has been   smoking Cigarettes.  She has a 7.5 pack-year smoking history. She has never used smokeless tobacco. She reports that she uses illicit drugs ("Crack" cocaine) about once per week. She reports that she does not drink alcohol.  ALLERGIES:  Allergies  Allergen Reactions  . Ciprofloxacin Itching     (Not in a hospital admission)  No results found for this or any previous visit (from the past 48 hour(s)). No results found.  AVW:UJWJXBJY: Feeling tired (fatigue), fever, night sweats, and recent weight loss. Head: No headache. Eyes: No eye symptoms. Otolaryngeal: No hearing loss, no earache, and no tinnitus.  Purulent nasal discharge  and nasal passage blockage (stuffiness).  No snoring, no sneezing, and no hoarseness.  Sore  throat. Cardiovascular: Chest pain or discomfort.  No palpitations. Pulmonary: No dyspnea, no cough, and no wheezing. Gastrointestinal: No dysphagia  and no heartburn.  Nausea.  No abdominal pain  and no melena.  No diarrhea. Genitourinary: No dysuria. Endocrine: No muscle weakness. Musculoskeletal: No calf muscle cramps, no arthralgias, and no soft tissue swelling. Neurological: Dizziness.  No fainting.  Tingling.  No numbness. Psychological: No anxiety.  Depression. Skin: Rash:.  Last menstrual period 02/02/2013.  BP:97/77,  HR: 79 b/min,  Height: 5 ft 6 in, Weight: 189 lb , BMI: 30.5 kg/m2,   PHYSICAL EXAM: She is fairly thin, anxious, and not obviously distressed.  Mental status is appropriate.  She hears well in conversational speech.  Voice is clear and respirations unlabored through the nose.  The head is atraumatic and neck supple.  Cranial nerves intact.  Ear canals are clear with normal drums.  Anterior nose shows a moderate leftward septal deviation with moist membranes.  Oral cavity reveals teeth in poor repair no other specific lesions.  Oropharynx is clear.  Neck without obvious adenopathy.  On by digital palpation in the floor of mouth, she has 2 cm the mandibular nodes on both sides, and some small submental nodes.  also some small facial nodes on each side.     Lungs: Clear to auscultation Heart: Regular rate and rhythm without murmurs Abdomen: Soft, active Extremities: Normal configuration Neurologic: Symmetric, grossly intact.   Studies Reviewed:CT neck    Assessment/Plan Lymphadenopathy, cervical (785.6) (R59.0). 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   we're concerned with multiple lymph nodes that you might have lymphoma.  We need to do a lymph node biopsy in the  near future.  You  also have a cyst behind your nose which I would like to look at, although it is probably not very important.   This is relatively small surgery.  You can go home the same day.  I  will see you back here 10 days later to remove stitches.  I did speak with Dr. Aundra Dubin, her cardiologist.  He does not think there are any contraindications to general anesthesia  Hydrocodone-Acetaminophen 5-325 MG Oral Tablet;1-2 po q4-6h prn pain; NWG95; R0; Rx.  [name] 09/10/3084, 8:28 PM

## 2013-04-05 ENCOUNTER — Ambulatory Visit (HOSPITAL_COMMUNITY): Payer: No Typology Code available for payment source | Admitting: Certified Registered Nurse Anesthetist

## 2013-04-05 ENCOUNTER — Encounter (HOSPITAL_COMMUNITY)
Admission: RE | Disposition: A | Discharge status: Home / Self Care | Payer: Self-pay | Source: Ambulatory Visit | Attending: Otolaryngology

## 2013-04-05 ENCOUNTER — Encounter (HOSPITAL_COMMUNITY): Payer: No Typology Code available for payment source | Admitting: Certified Registered Nurse Anesthetist

## 2013-04-05 ENCOUNTER — Ambulatory Visit (HOSPITAL_COMMUNITY)
Admission: RE | Admit: 2013-04-05 | Discharge: 2013-04-05 | Disposition: A | Discharge status: Home / Self Care | Payer: No Typology Code available for payment source | Source: Ambulatory Visit | Attending: Otolaryngology | Admitting: Otolaryngology

## 2013-04-05 ENCOUNTER — Ambulatory Visit: Payer: Self-pay | Admitting: Hematology and Oncology

## 2013-04-05 ENCOUNTER — Other Ambulatory Visit: Payer: Self-pay

## 2013-04-05 ENCOUNTER — Encounter (HOSPITAL_COMMUNITY): Payer: Self-pay | Admitting: Certified Registered Nurse Anesthetist

## 2013-04-05 DIAGNOSIS — J45909 Unspecified asthma, uncomplicated: Secondary | ICD-10-CM | POA: Insufficient documentation

## 2013-04-05 DIAGNOSIS — I2789 Other specified pulmonary heart diseases: Secondary | ICD-10-CM | POA: Insufficient documentation

## 2013-04-05 DIAGNOSIS — E785 Hyperlipidemia, unspecified: Secondary | ICD-10-CM | POA: Insufficient documentation

## 2013-04-05 DIAGNOSIS — I10 Essential (primary) hypertension: Secondary | ICD-10-CM | POA: Insufficient documentation

## 2013-04-05 DIAGNOSIS — F17200 Nicotine dependence, unspecified, uncomplicated: Secondary | ICD-10-CM | POA: Insufficient documentation

## 2013-04-05 DIAGNOSIS — F3289 Other specified depressive episodes: Secondary | ICD-10-CM | POA: Insufficient documentation

## 2013-04-05 DIAGNOSIS — I2510 Atherosclerotic heart disease of native coronary artery without angina pectoris: Secondary | ICD-10-CM | POA: Insufficient documentation

## 2013-04-05 DIAGNOSIS — F411 Generalized anxiety disorder: Secondary | ICD-10-CM | POA: Insufficient documentation

## 2013-04-05 DIAGNOSIS — Z6830 Body mass index (BMI) 30.0-30.9, adult: Secondary | ICD-10-CM | POA: Insufficient documentation

## 2013-04-05 DIAGNOSIS — D61818 Other pancytopenia: Secondary | ICD-10-CM | POA: Insufficient documentation

## 2013-04-05 DIAGNOSIS — D72822 Plasmacytosis: Secondary | ICD-10-CM | POA: Insufficient documentation

## 2013-04-05 DIAGNOSIS — I5032 Chronic diastolic (congestive) heart failure: Secondary | ICD-10-CM | POA: Insufficient documentation

## 2013-04-05 DIAGNOSIS — I079 Rheumatic tricuspid valve disease, unspecified: Secondary | ICD-10-CM | POA: Insufficient documentation

## 2013-04-05 DIAGNOSIS — J392 Other diseases of pharynx: Secondary | ICD-10-CM | POA: Insufficient documentation

## 2013-04-05 DIAGNOSIS — I509 Heart failure, unspecified: Secondary | ICD-10-CM | POA: Insufficient documentation

## 2013-04-05 DIAGNOSIS — F329 Major depressive disorder, single episode, unspecified: Secondary | ICD-10-CM | POA: Insufficient documentation

## 2013-04-05 DIAGNOSIS — D539 Nutritional anemia, unspecified: Secondary | ICD-10-CM | POA: Insufficient documentation

## 2013-04-05 HISTORY — DX: Hyperlipidemia, unspecified: E78.5

## 2013-04-05 HISTORY — DX: Anxiety disorder, unspecified: F41.9

## 2013-04-05 HISTORY — DX: Angina pectoris, unspecified: I20.9

## 2013-04-05 HISTORY — DX: Mental disorder, not otherwise specified: F99

## 2013-04-05 HISTORY — PX: LYMPH NODE BIOPSY: SHX201

## 2013-04-05 HISTORY — DX: Depression, unspecified: F32.A

## 2013-04-05 HISTORY — DX: Major depressive disorder, single episode, unspecified: F32.9

## 2013-04-05 HISTORY — DX: Other pancytopenia: D61.818

## 2013-04-05 LAB — COMPREHENSIVE METABOLIC PANEL
ALBUMIN: 2.6 g/dL — AB (ref 3.5–5.2)
ALK PHOS: 72 U/L (ref 39–117)
ALT: 6 U/L (ref 0–35)
AST: 11 U/L (ref 0–37)
BUN: 8 mg/dL (ref 6–23)
CALCIUM: 8.6 mg/dL (ref 8.4–10.5)
CO2: 20 mEq/L (ref 19–32)
Chloride: 101 mEq/L (ref 96–112)
Creatinine, Ser: 1.08 mg/dL (ref 0.50–1.10)
GFR calc non Af Amer: 59 mL/min — ABNORMAL LOW (ref >90)
GFR, EST AFRICAN AMERICAN: 69 mL/min — AB (ref >90)
GLUCOSE: 102 mg/dL — AB (ref 70–99)
Potassium: 4.4 mEq/L (ref 3.7–5.3)
Sodium: 133 mEq/L — ABNORMAL LOW (ref 137–147)
TOTAL PROTEIN: 8.3 g/dL (ref 6.0–8.3)
Total Bilirubin: 0.3 mg/dL (ref 0.3–1.2)

## 2013-04-05 LAB — CBC
HCT: 27.2 % — ABNORMAL LOW (ref 36.0–46.0)
Hemoglobin: 8.6 g/dL — ABNORMAL LOW (ref 12.0–15.0)
MCH: 24.6 pg — ABNORMAL LOW (ref 26.0–34.0)
MCHC: 31.6 g/dL (ref 30.0–36.0)
MCV: 77.9 fL — ABNORMAL LOW (ref 78.0–100.0)
PLATELETS: 222 10*3/uL (ref 150–400)
RBC: 3.49 MIL/uL — ABNORMAL LOW (ref 3.87–5.11)
RDW: 20.4 % — AB (ref 11.5–15.5)
WBC: 2 10*3/uL — AB (ref 4.0–10.5)

## 2013-04-05 LAB — HCG, SERUM, QUALITATIVE: Preg, Serum: NEGATIVE

## 2013-04-05 SURGERY — LYMPH NODE BIOPSY
Anesthesia: General | Site: Neck

## 2013-04-05 MED ORDER — ONDANSETRON HCL 4 MG PO TABS: 4.0000 mg | ORAL_TABLET | ORAL | Status: DC | PRN

## 2013-04-05 MED ORDER — PHENYLEPHRINE HCL 10 MG/ML IJ SOLN
INTRAMUSCULAR | Status: DC | PRN
  Administered 2013-04-05: 40 ug via INTRAVENOUS
  Administered 2013-04-05 (×3): 80 ug via INTRAVENOUS
  Administered 2013-04-05: 40 ug via INTRAVENOUS
  Administered 2013-04-05: 80 ug via INTRAVENOUS
  Administered 2013-04-05 (×2): 40 ug via INTRAVENOUS

## 2013-04-05 MED ORDER — BACITRACIN ZINC 500 UNIT/GM EX OINT
TOPICAL_OINTMENT | CUTANEOUS | Status: AC
  Filled 2013-04-05: qty 15

## 2013-04-05 MED ORDER — ONDANSETRON HCL 4 MG/2ML IJ SOLN
INTRAMUSCULAR | Status: DC | PRN
  Administered 2013-04-05: 4 mg via INTRAVENOUS

## 2013-04-05 MED ORDER — SUCCINYLCHOLINE CHLORIDE 20 MG/ML IJ SOLN
INTRAMUSCULAR | Status: DC | PRN
Start: 2013-04-05 — End: 2013-04-05
  Administered 2013-04-05: 100 mg via INTRAVENOUS

## 2013-04-05 MED ORDER — HYDROCODONE-ACETAMINOPHEN 5-325 MG PO TABS
ORAL_TABLET | ORAL | Status: AC
  Administered 2013-04-05: 2 via ORAL
  Filled 2013-04-05: qty 2

## 2013-04-05 MED ORDER — SODIUM CHLORIDE 0.9 % IJ SOLN
INTRAMUSCULAR | Status: AC
  Filled 2013-04-05: qty 10

## 2013-04-05 MED ORDER — HYDROMORPHONE HCL PF 1 MG/ML IJ SOLN
INTRAMUSCULAR | Status: AC
  Administered 2013-04-05: 0.5 mg via INTRAVENOUS
  Filled 2013-04-05: qty 1

## 2013-04-05 MED ORDER — 0.9 % SODIUM CHLORIDE (POUR BTL) OPTIME
TOPICAL | Status: DC | PRN
  Administered 2013-04-05: 1000 mL

## 2013-04-05 MED ORDER — MIDAZOLAM HCL 2 MG/2ML IJ SOLN: 0.5000 mg | Freq: Once | INTRAMUSCULAR | Status: DC | PRN

## 2013-04-05 MED ORDER — MIDAZOLAM HCL 5 MG/5ML IJ SOLN
INTRAMUSCULAR | Status: DC | PRN
  Administered 2013-04-05: 2 mg via INTRAVENOUS

## 2013-04-05 MED ORDER — LIDOCAINE-EPINEPHRINE 1 %-1:100000 IJ SOLN
INTRAMUSCULAR | Status: DC | PRN
  Administered 2013-04-05: 4 mL via INTRADERMAL

## 2013-04-05 MED ORDER — DEXTROSE-NACL 5-0.45 % IV SOLN: INTRAVENOUS | Status: DC

## 2013-04-05 MED ORDER — OXYCODONE HCL 5 MG PO TABS: 5.0000 mg | ORAL_TABLET | Freq: Once | ORAL | Status: DC | PRN

## 2013-04-05 MED ORDER — OXYCODONE HCL 5 MG/5ML PO SOLN: 5.0000 mg | Freq: Once | ORAL | Status: DC | PRN

## 2013-04-05 MED ORDER — METHYLENE BLUE 1 % INJ SOLN
INTRAMUSCULAR | Status: AC
  Filled 2013-04-05: qty 10

## 2013-04-05 MED ORDER — FENTANYL CITRATE 0.05 MG/ML IJ SOLN
INTRAMUSCULAR | Status: DC | PRN
  Administered 2013-04-05: 150 ug via INTRAVENOUS

## 2013-04-05 MED ORDER — MEPERIDINE HCL 25 MG/ML IJ SOLN: 6.2500 mg | INTRAMUSCULAR | Status: DC | PRN

## 2013-04-05 MED ORDER — LIDOCAINE-EPINEPHRINE 1 %-1:100000 IJ SOLN
INTRAMUSCULAR | Status: AC
  Filled 2013-04-05: qty 1

## 2013-04-05 MED ORDER — PROMETHAZINE HCL 25 MG/ML IJ SOLN: 6.2500 mg | INTRAMUSCULAR | Status: DC | PRN

## 2013-04-05 MED ORDER — HYDROMORPHONE HCL PF 1 MG/ML IJ SOLN
0.2500 mg | INTRAMUSCULAR | Status: DC | PRN
  Administered 2013-04-05: 0.5 mg via INTRAVENOUS

## 2013-04-05 MED ORDER — LIDOCAINE-EPINEPHRINE 0.5 %-1:200000 IJ SOLN
INTRAMUSCULAR | Status: AC
  Filled 2013-04-05: qty 1

## 2013-04-05 MED ORDER — OXYMETAZOLINE HCL 0.05 % NA SOLN
2.0000 | NASAL | Status: AC
  Administered 2013-04-05 (×2): 2 via NASAL
  Filled 2013-04-05: qty 15

## 2013-04-05 MED ORDER — LACTATED RINGERS IV SOLN
INTRAVENOUS | Status: DC | PRN
  Administered 2013-04-05 (×2): via INTRAVENOUS

## 2013-04-05 MED ORDER — ONDANSETRON HCL 4 MG/2ML IJ SOLN: 4.0000 mg | INTRAMUSCULAR | Status: DC | PRN

## 2013-04-05 MED ORDER — PROPOFOL 10 MG/ML IV BOLUS
INTRAVENOUS | Status: DC | PRN
  Administered 2013-04-05 (×2): 50 mg via INTRAVENOUS
  Administered 2013-04-05: 150 mg via INTRAVENOUS
  Administered 2013-04-05: 50 mg via INTRAVENOUS

## 2013-04-05 MED ORDER — HYDROCODONE-ACETAMINOPHEN 5-325 MG PO TABS
1.0000 | ORAL_TABLET | ORAL | Status: DC | PRN
  Administered 2013-04-05: 2 via ORAL

## 2013-04-05 MED ORDER — LIDOCAINE HCL (CARDIAC) 20 MG/ML IV SOLN
INTRAVENOUS | Status: DC | PRN
  Administered 2013-04-05: 30 mg via INTRAVENOUS

## 2013-04-05 SURGICAL SUPPLY — 51 items
ADH SKN CLS APL DERMABOND .7 (GAUZE/BANDAGES/DRESSINGS) ×1
AIRSTRIP 4 3/4X3 1/4 7185 (GAUZE/BANDAGES/DRESSINGS) IMPLANT
BANDAGE GAUZE ELAST BULKY 4 IN (GAUZE/BANDAGES/DRESSINGS) IMPLANT
CANISTER SUCTION 2500CC (MISCELLANEOUS) IMPLANT
CLEANER TIP ELECTROSURG 2X2 (MISCELLANEOUS) ×3 IMPLANT
CLOTH BEACON ORANGE TIMEOUT ST (SAFETY) ×3 IMPLANT
CONT SPEC 4OZ CLIKSEAL STRL BL (MISCELLANEOUS) ×5 IMPLANT
CORDS BIPOLAR (ELECTRODE) ×2 IMPLANT
COVER SURGICAL LIGHT HANDLE (MISCELLANEOUS) ×3 IMPLANT
CRADLE DONUT ADULT HEAD (MISCELLANEOUS) IMPLANT
DERMABOND ADVANCED (GAUZE/BANDAGES/DRESSINGS) ×2
DERMABOND ADVANCED .7 DNX12 (GAUZE/BANDAGES/DRESSINGS) IMPLANT
DRAIN PENROSE 1/4X12 LTX STRL (WOUND CARE) IMPLANT
DRSG EMULSION OIL 3X3 NADH (GAUZE/BANDAGES/DRESSINGS) IMPLANT
ELECT COATED BLADE 2.86 ST (ELECTRODE) ×3 IMPLANT
ELECT REM PT RETURN 9FT ADLT (ELECTROSURGICAL) ×3
ELECTRODE REM PT RTRN 9FT ADLT (ELECTROSURGICAL) ×1 IMPLANT
GLOVE ECLIPSE 8.0 STRL XLNG CF (GLOVE) ×3 IMPLANT
GOWN PREVENTION PLUS XLARGE (GOWN DISPOSABLE) ×3 IMPLANT
GOWN STRL NON-REIN LRG LVL3 (GOWN DISPOSABLE) ×3 IMPLANT
KIT BASIN OR (CUSTOM PROCEDURE TRAY) ×3 IMPLANT
KIT ROOM TURNOVER OR (KITS) ×3 IMPLANT
LOCATOR NERVE 3 VOLT (DISPOSABLE) IMPLANT
NDL FILTER BLUNT 18X1 1/2 (NEEDLE) IMPLANT
NDL HYPO 25GX1X1/2 BEV (NEEDLE) IMPLANT
NEEDLE FILTER BLUNT 18X 1/2SAF (NEEDLE)
NEEDLE FILTER BLUNT 18X1 1/2 (NEEDLE) IMPLANT
NEEDLE HYPO 25GX1X1/2 BEV (NEEDLE) IMPLANT
NS IRRIG 1000ML POUR BTL (IV SOLUTION) ×3 IMPLANT
PAD ARMBOARD 7.5X6 YLW CONV (MISCELLANEOUS) ×6 IMPLANT
PENCIL BUTTON HOLSTER BLD 10FT (ELECTRODE) ×3 IMPLANT
RUBBERBAND STERILE (MISCELLANEOUS) IMPLANT
SOLUTION ANTI FOG 6CC (MISCELLANEOUS) ×2 IMPLANT
SPECIMEN JAR SMALL (MISCELLANEOUS) ×3 IMPLANT
SPONGE GAUZE 4X4 12PLY (GAUZE/BANDAGES/DRESSINGS) IMPLANT
SPONGE LAP 4X18 X RAY DECT (DISPOSABLE) ×2 IMPLANT
STAPLER VISISTAT 35W (STAPLE) ×3 IMPLANT
SUT CHROMIC 3 0 PS 2 (SUTURE) ×3 IMPLANT
SUT CHROMIC 4 0 P 3 18 (SUTURE) ×3 IMPLANT
SUT ETHILON 6 0 P 1 (SUTURE) ×3 IMPLANT
SUT SILK 3 0 (SUTURE) ×3
SUT SILK 3-0 18XBRD TIE 12 (SUTURE) ×1 IMPLANT
SWAB COLLECTION DEVICE MRSA (MISCELLANEOUS) IMPLANT
SYR TB 1ML LUER SLIP (SYRINGE) IMPLANT
TOWEL OR 17X24 6PK STRL BLUE (TOWEL DISPOSABLE) ×3 IMPLANT
TOWEL OR 17X26 10 PK STRL BLUE (TOWEL DISPOSABLE) ×3 IMPLANT
TRAY ENT MC OR (CUSTOM PROCEDURE TRAY) ×3 IMPLANT
TUBE ANAEROBIC SPECIMEN COL (MISCELLANEOUS) IMPLANT
TUBE CONNECTING 12'X1/4 (SUCTIONS) ×1
TUBE CONNECTING 12X1/4 (SUCTIONS) ×1 IMPLANT
WATER STERILE IRR 1000ML POUR (IV SOLUTION) ×3 IMPLANT

## 2013-04-05 NOTE — Anesthesia Postprocedure Evaluation (Signed)
  Anesthesia Post-op Note  Patient: Psychologist, occupational  Procedure(s) Performed: Procedure(s): EXCISIONAL BIOPSY RIGHT SUBMANDIBULAR NODE, NASAL ENDOSCOPIC WITH BIOPSY NASAL PHARYNX (N/A)  Patient Location: PACU  Anesthesia Type:General  Level of Consciousness: awake, alert , oriented and patient cooperative  Airway and Oxygen Therapy: Patient Spontanous Breathing and Patient connected to nasal cannula oxygen  Post-op Pain: none  Post-op Assessment: Post-op Vital signs reviewed, Patient's Cardiovascular Status Stable, Respiratory Function Stable, Patent Airway, No signs of Nausea or vomiting and Pain level controlled  Post-op Vital Signs: Reviewed and stable  Complications: No apparent anesthesia complications

## 2013-04-05 NOTE — Anesthesia Preprocedure Evaluation (Addendum)
Anesthesia Evaluation  Patient identified by MRN, date of birth, ID band Patient awake    Reviewed: Allergy & Precautions, H&P , NPO status , Patient's Chart, lab work & pertinent test results  History of Anesthesia Complications Negative for: history of anesthetic complications  Airway Mallampati: II TM Distance: >3 FB Neck ROM: Full    Dental  (+) Dental Advisory Given, Poor Dentition and Missing,    Pulmonary shortness of breath and with exertion, asthma , Current Smoker,  08/12/12 PFT:  Mild restriction, positive air trapping. Lung volumes decreased. breath sounds clear to auscultation  Pulmonary exam normal       Cardiovascular hypertension, Pt. on medications - angina+ CAD (non obstructive ASCADz ), +CHF and + DOE + Valvular Problems/Murmurs Rhythm:Regular Rate:Normal  07/19/12 2DEcho:  Study Conclusions  - Left ventricle: The cavity size was normal. Wall thickness was increased in a pattern of moderate LVH. The estimated ejection fraction was 65%. Wall motion was normal; there were no regional wall motion abnormalities. Findings consistent with left ventricular diastolic dysfunction. - Aortic valve: Mild regurgitation. - Mitral valve: Mild regurgitation.    Neuro/Psych PSYCHIATRIC DISORDERS Anxiety Depression CVA    GI/Hepatic negative GI ROS, (+)     substance abuse  cocaine use,   Endo/Other  Morbid obesity  Renal/GU negative Renal ROS     Musculoskeletal  (+) Arthritis -,   Abdominal   Peds  Hematology  (+) Blood dyscrasia (Hb 8.6), anemia ,   Anesthesia Other Findings   Reproductive/Obstetrics 04/05/13 Preg test NEG                      Anesthesia Physical Anesthesia Plan  ASA: III  Anesthesia Plan: General   Post-op Pain Management:    Induction: Intravenous  Airway Management Planned: LMA  Additional Equipment:   Intra-op Plan:   Post-operative Plan: Extubation in  OR  Informed Consent: I have reviewed the patients History and Physical, chart, labs and discussed the procedure including the risks, benefits and alternatives for the proposed anesthesia with the patient or authorized representative who has indicated his/her understanding and acceptance.   Dental advisory given  Plan Discussed with: CRNA, Anesthesiologist and Surgeon  Anesthesia Plan Comments: (Plan routine monitors, GA- LMA OK)       Anesthesia Quick Evaluation

## 2013-04-05 NOTE — Discharge Instructions (Signed)
Ice pack RIGHT neck x 24 hrs Keep head of bed elevated 3-4 nights No need for ointment May shower beginning Friday No strenuous activity x 7 days May trim loose edges of wound glue as they come loose Call for signs of bleeding or infection, [phone] Recheck my office 2-3 weeks.

## 2013-04-05 NOTE — H&P (View-Only) (Signed)
Morgan Bean, [place]inberg 48 y.o., female [id number]     Chief Complaint: Lymphadenopathy  HPI: 48 year old black female is sent over for evaluation of persistent lymphadenopathy.  She claims she has had up-and-down swelling of her next for several years.  She had some adenopathy removed from her LEFT axilla which was reportedly benign, last year.  She has a single HIV positive test and multiple negative tests.  She is presumed HIV negative.  She has had some pancytopenia.  She has lost 11 pounds.  She does have chills and night sweats.  A recent patch last CT scan showed mildly positive adenopathy in both next, and a possible spleen abnormality.  A prior CT scan in 2014 actually showed larger lymph nodes.  Medical oncology is concerned about the possibility of lymphoma and requests lymph node biopsy.     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  She was unaware of this and has no symptoms.  PMH: Past Medical History  Diagnosis Date  . Coronary artery disease     Lexiscan myoview (10/12) with significant ST depression upon Lexiscan injection but no ischemia or infarction on perfusion images. Left heart cath (10/12): 30% mid LAD, 30% ostial D1, 50% ostial D2.    [name] Hypertension   . Tricuspid regurgitation   . Iron deficiency     hx of  . Polysubstance abuse     Prior cocaine  . Tobacco abuse   . Leukocytoclastic vasculitis     Rash across lower body, occurred in 9/11, diagnosed by skin biopsy. ANA positive. Thought to be secondary to cocaine use.  . Pulmonary HTN     Echo (9/11) with EF 65%, mild LVH, mild AI, mild MR, moderate to possibly severe TR with PA systolic pressure 52 mmHg. Echo (10/12): severe LV hypertrophy, EF 60-65%, mild MR, mild AI, moderate to severe tricuspid regurgitation, PA systolic pressure 38 mmHg.  Echo 4/14: moderate LVH, EF 65%, normal wall motion, diastolic dysfunction, mild AI, mild MR  . Asthma   .  Shortness of breath     a. PFTs 5/14:  FEV1/FVC 99% predicted; FVC 60% predicted; DLCO mildly reduced, mild restriction, air trapping.;  b.  seen by pulmo (Dr. [name]) 5/14: mainly upper airway symptoms - ACE d/c'd (sx's better)  . Chronic diastolic CHF (congestive heart failure)   . Constipation   . Lymphadenopathy 03/21/2013  . Leukopenia 03/21/2013  . Unspecified deficiency anemia 03/29/2013  . Anginal pain     none in past year  . Anxiety   . Mental disorder   . Depression   . Stroke 2010ish  . Arthritis   . Pancytopenia   . Hyperlipemia     Surg Hx: Past Surgical History  Procedure Laterality Date  . Ankle surgery      right  . Cardiac catheterization    . I&d extremity  08/09/2011    Procedure: IRRIGATION AND DEBRIDEMENT EXTREMITY;  Surgeon: [name], MD;  Location: [facility];  Service: General;  Laterality: Left;  i & D Left axilla abscess    FHx:   Family History  Problem Relation Age of Onset  . Coronary artery disease Mother   . Diabetes Mother   . Breast cancer Maternal Grandmother   . Diabetes Maternal Grandmother   . Diabetes Father   . Hypertension Father   . Coronary artery disease Father    SocHx:  reports that she has been  smoking Cigarettes.  She has a 7.5 pack-year smoking history. She has never used smokeless tobacco. She reports that she uses illicit drugs ("Crack" cocaine) about once per week. She reports that she does not drink alcohol.  ALLERGIES:  Allergies  Allergen Reactions  . Ciprofloxacin Itching     (Not in a hospital admission)  No results found for this or any previous visit (from the past 48 hour(s)). No results found.  AVW:UJWJXBJY: Feeling tired (fatigue), fever, night sweats, and recent weight loss. Head: No headache. Eyes: No eye symptoms. Otolaryngeal: No hearing loss, no earache, and no tinnitus.  Purulent nasal discharge  and nasal passage blockage (stuffiness).  No snoring, no sneezing, and no hoarseness.  Sore  throat. Cardiovascular: Chest pain or discomfort.  No palpitations. Pulmonary: No dyspnea, no cough, and no wheezing. Gastrointestinal: No dysphagia  and no heartburn.  Nausea.  No abdominal pain  and no melena.  No diarrhea. Genitourinary: No dysuria. Endocrine: No muscle weakness. Musculoskeletal: No calf muscle cramps, no arthralgias, and no soft tissue swelling. Neurological: Dizziness.  No fainting.  Tingling.  No numbness. Psychological: No anxiety.  Depression. Skin: Rash:.  Last menstrual period 02/02/2013.  BP:97/77,  HR: 79 b/min,  Height: 5 ft 6 in, Weight: 189 lb , BMI: 30.5 kg/m2,   PHYSICAL EXAM: She is fairly thin, anxious, and not obviously distressed.  Mental status is appropriate.  She hears well in conversational speech.  Voice is clear and respirations unlabored through the nose.  The head is atraumatic and neck supple.  Cranial nerves intact.  Ear canals are clear with normal drums.  Anterior nose shows a moderate leftward septal deviation with moist membranes.  Oral cavity reveals teeth in poor repair no other specific lesions.  Oropharynx is clear.  Neck without obvious adenopathy.  On by digital palpation in the floor of mouth, she has 2 cm the mandibular nodes on both sides, and some small submental nodes.  also some small facial nodes on each side.     Lungs: Clear to auscultation Heart: Regular rate and rhythm without murmurs Abdomen: Soft, active Extremities: Normal configuration Neurologic: Symmetric, grossly intact.   Studies Reviewed:CT neck    Assessment/Plan Lymphadenopathy, cervical (785.6) (R59.0). 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   we're concerned with multiple lymph nodes that you might have lymphoma.  We need to do a lymph node biopsy in the  near future.  You  also have a cyst behind your nose which I would like to look at, although it is probably not very important.   This is relatively small surgery.  You can go home the same day.  I  will see you back here 10 days later to remove stitches.  I did speak with Dr. Aundra Dubin, her cardiologist.  He does not think there are any contraindications to general anesthesia  Hydrocodone-Acetaminophen 5-325 MG Oral Tablet;1-2 po q4-6h prn pain; NWG95; R0; Rx.  [name] 09/10/3084, 8:28 PM

## 2013-04-05 NOTE — Anesthesia Procedure Notes (Signed)
Procedure Name: Intubation Date/Time: 04/05/2013 11:11 AM Performed by: Blair Heys E Pre-anesthesia Checklist: Patient identified, Emergency Drugs available, Suction available and Patient being monitored Patient Re-evaluated:Patient Re-evaluated prior to inductionOxygen Delivery Method: Circle system utilized Preoxygenation: Pre-oxygenation with 100% oxygen Intubation Type: IV induction Ventilation: Mask ventilation without difficulty Laryngoscope Size: Miller and 2 Grade View: Grade I Tube type: Oral Tube size: 7.5 mm Number of attempts: 1 Airway Equipment and Method: Stylet Placement Confirmation: ETT inserted through vocal cords under direct vision,  positive ETCO2 and breath sounds checked- equal and bilateral Secured at: 22 cm Tube secured with: Tape Dental Injury: Teeth and Oropharynx as per pre-operative assessment

## 2013-04-05 NOTE — Op Note (Signed)
04/05/2013  11:57 AM    Morgan Bean  [id number]   Pre-Op Dx:  Bilateral neck lymphadenopathy, nasopharyngeal Tornwaldt cyst  Post-op Dx: Same   Proc:  excisional biopsy, right neck lymph node. Nasopharyngeal endoscopy with biopsy.   Surg:  Tyson Alias MD  Anes:  GOT  EBL:  Minimal  Comp:  None  Findings:  Nasopharyngeal fullness which on biopsy revealed a cloudy-fluid filled cyst.  2 cm right submandibular lymph node, pale yellow and rubbery firm.  Procedure: With the patient in a comfortable supine position, general orotracheal anesthesia was induced without difficulty. The patient had received preoperative Afrin spray. At an appropriate level, the patient was placed in a slight sitting position. A saline moistened throat pack was placed.   0 nasal endoscope was used to inspect the nasopharynx from both sides. An up-biting Wilde forceps was placed against the fullness and a bite was taken revealing the cyst contents. A biopsy of the mucosa was removed.  Hemostasis was spontaneous.  1% Xylocaine with 1 100,000 epinephrine, 4 cc total was infiltrated into the proposed incision line on the right neck overlying the palpable node. Several minutes were allowed for this to take effect. A sterile preparation and draping of the right neck was accomplished.  The node was palpated. A 3 cm incision was sharply executed through skin and subcutaneous fat. A small branch of external jugular vein was controlled with 3-0 silk ligature.   the platysma muscle was identified and lysed with the cautery. A minimal superior subplatysmal plane was raised. The fullness of the lymph node was identified. The fascia over the lymph node was dissected and lysed with the cautery and with scissors. The lymph node was delivered.  Hemostasis was observed. Valsalva was administered and hemostasis was observed again. The specimen was sent off for lymphoma interpretation. The wound was thoroughly  irrigated.  The platysmal plane was closed with interrupted 4-0 chromic suture. The subcutaneous fat was closed with the same. The skin was closed in a cosmetic fashion with Dermabond adhesive.  The pharynx was suctioned clear of clear secretions and the throat pack was removed.    At this point the procedure was completed. The patient was returned to anesthesia, awakened, extubated, and transferred to recovery in stable condition.  Dispo:   PACU to home  Plan:  Ice, elevation, analgesia. Await final pathology interpretation. Given low anticipated risk of postanesthetic or postsurgical complications, feel an outpatient venue is appropriate.  Tyson Alias MD

## 2013-04-05 NOTE — Transfer of Care (Signed)
Immediate Anesthesia Transfer of Care Note  Patient: Morgan Bean  Procedure(s) Performed: Procedure(s): EXCISIONAL BIOPSY RIGHT SUBMANDIBULAR NODE, NASAL ENDOSCOPIC WITH BIOPSY NASAL PHARYNX (N/A)  Patient Location: PACU  Anesthesia Type:General  Level of Consciousness: awake and alert   Airway & Oxygen Therapy: Patient Spontanous Breathing and Patient connected to nasal cannula oxygen  Post-op Assessment: Report given to PACU RN, Post -op Vital signs reviewed and stable and Patient moving all extremities X 4  Post vital signs: Reviewed and stable  Complications: No apparent anesthesia complications

## 2013-04-05 NOTE — Interval H&P Note (Signed)
History and Physical Interval Note:  04/05/2013 10:36 AM  Morgan Bean  has presented today for surgery, with the diagnosis of [place]  The various methods of treatment have been discussed with the patient and family. After consideration of risks, benefits and other options for treatment, the patient has consented to  Procedure(s): EXCISIONAL BIOPSY RIGHT SUBMANDIBULAR NODE, NASAL ENDOSCOPIC WITH BIOPSY NASAL PHARYNX (N/A) as a surgical intervention .  The patient's history has been re-reviewed, patient re-examined, no change in status, stable for surgery.  I have re-reviewed the patient's chart and labs.  Questions were answered to the patient's satisfaction.     [name]

## 2013-04-05 NOTE — Preoperative (Signed)
Beta Blockers   Reason not to administer Beta Blockers:Not Applicable 

## 2013-04-07 ENCOUNTER — Encounter (HOSPITAL_COMMUNITY): Payer: Self-pay | Admitting: Otolaryngology

## 2013-04-11 ENCOUNTER — Telehealth: Payer: Self-pay | Admitting: *Deleted

## 2013-04-11 ENCOUNTER — Encounter: Payer: Self-pay | Admitting: Internal Medicine

## 2013-04-11 ENCOUNTER — Ambulatory Visit: Payer: No Typology Code available for payment source | Attending: Internal Medicine | Admitting: Internal Medicine

## 2013-04-11 VITALS — BP 119/84 | HR 91 | Temp 98.6°F | Resp 14 | Ht 66.0 in | Wt 189.8 lb

## 2013-04-11 DIAGNOSIS — D72819 Decreased white blood cell count, unspecified: Secondary | ICD-10-CM | POA: Insufficient documentation

## 2013-04-11 DIAGNOSIS — R591 Generalized enlarged lymph nodes: Secondary | ICD-10-CM

## 2013-04-11 DIAGNOSIS — F17200 Nicotine dependence, unspecified, uncomplicated: Secondary | ICD-10-CM | POA: Insufficient documentation

## 2013-04-11 DIAGNOSIS — R599 Enlarged lymph nodes, unspecified: Secondary | ICD-10-CM

## 2013-04-11 MED ORDER — NICOTINE 14 MG/24HR TD PT24: 14.0000 mg | MEDICATED_PATCH | Freq: Every day | TRANSDERMAL | Status: DC

## 2013-04-11 NOTE — Progress Notes (Signed)
Patient ID: Morgan Bean, female   DOB: Feb 07, 1966, 48 y.o.   MRN: [id number]   CC:  HPI:  48 year old female with a history of polysubstance abuse, recent extensive evaluation by hematology oncology as well as ENT, bilateral neck lymphadenopathy excisional biopsy, nasopharyngeal endoscopy with biopsy by Dr. Erik Obey, who returns to the clinic for a followup. Repeat HIV antibody panel was negative. Anemia was thought to be consistent with iron deficiency anemia. She also had a PET scan and was found to have diffuse lymphadenopathy, Patient continues to smoke  Allergies  Allergen Reactions  . Ciprofloxacin Itching   Past Medical History  Diagnosis Date  . Coronary artery disease     Lexiscan myoview (10/12) with significant ST depression upon Lexiscan injection but no ischemia or infarction on perfusion images. Left heart cath (10/12): 30% mid LAD, 30% ostial D1, 50% ostial D2.    [name] Hypertension   . Tricuspid regurgitation   . Iron deficiency     hx of  . Polysubstance abuse     Prior cocaine  . Tobacco abuse   . Leukocytoclastic vasculitis     Rash across lower body, occurred in 9/11, diagnosed by skin biopsy. ANA positive. Thought to be secondary to cocaine use.  . Pulmonary HTN     Echo (9/11) with EF 65%, mild LVH, mild AI, mild MR, moderate to possibly severe TR with PA systolic pressure 52 mmHg. Echo (10/12): severe LV hypertrophy, EF 60-65%, mild MR, mild AI, moderate to severe tricuspid regurgitation, PA systolic pressure 38 mmHg.  Echo 4/14: moderate LVH, EF 65%, normal wall motion, diastolic dysfunction, mild AI, mild MR  . Asthma   . Shortness of breath     a. PFTs 5/14:  FEV1/FVC 99% predicted; FVC 60% predicted; DLCO mildly reduced, mild restriction, air trapping.;  b.  seen by pulmo (Dr. [name]) 5/14: mainly upper airway symptoms - ACE d/c'd (sx's better)  . Chronic diastolic CHF (congestive heart failure)   . Constipation   . Lymphadenopathy 03/21/2013  . Leukopenia  03/21/2013  . Unspecified deficiency anemia 03/29/2013  . Anginal pain     none in past year  . Anxiety   . Mental disorder   . Depression   . Stroke 2010ish  . Arthritis   . Pancytopenia   . Hyperlipemia    Current Outpatient Prescriptions on File Prior to Visit  Medication Sig Dispense Refill  . albuterol (PROVENTIL HFA;VENTOLIN HFA) 108 (90 BASE) MCG/ACT inhaler Inhale 2 puffs into the lungs every 4 (four) hours as needed for wheezing or shortness of breath.      [name] amLODipine (NORVASC) 10 MG tablet TAKE ONE TABLET DAILY  30 tablet  0  . aspirin 81 MG tablet Take 81 mg by mouth daily.      [name] buPROPion (WELLBUTRIN) 100 MG tablet Take 100 mg by mouth 2 (two) times daily.      . citalopram (CELEXA) 40 MG tablet Take 40 mg by mouth daily.      . cloNIDine (CATAPRES) 0.3 MG tablet Take 0.3 mg by mouth 2 (two) times daily.      . ferrous sulfate 325 (65 FE) MG tablet Take 1 tablet (325 mg total) by mouth 2 (two) times daily with a meal.  60 tablet  3  . furosemide (LASIX) 20 MG tablet Take 1 tablet (20 mg total) by mouth 2 (two) times daily.  60 tablet  11  . hydrOXYzine (ATARAX/VISTARIL) 25 MG tablet Take 25 mg by mouth at  bedtime as needed (sleep).      . loratadine (CLARITIN) 10 MG tablet Take 1 tablet (10 mg total) by mouth daily.  30 tablet  3  . Multiple Vitamin (MULTIVITAMIN WITH MINERALS) TABS Take 1 tablet by mouth daily.      . polyethylene glycol powder (GLYCOLAX/MIRALAX) powder Take 17 g by mouth daily as needed (constipation).       . potassium chloride (K-DUR,KLOR-CON) 10 MEQ tablet Take 1 tablet (10 mEq total) by mouth 2 (two) times daily with a meal.  60 tablet  11  . traZODone (DESYREL) 100 MG tablet Take 100 mg by mouth at bedtime.       No current facility-administered medications on file prior to visit.   Family History  Problem Relation Age of Onset  . Coronary artery disease Mother   . Diabetes Mother   . Breast cancer Maternal Grandmother   . Diabetes Maternal  Grandmother   . Diabetes Father   . Hypertension Father   . Coronary artery disease Father    History   Social History  . Marital Status: Single    Spouse Name: N/A    Number of Children: 2  . Years of Education: N/A   Occupational History  . unemployed    Social History Main Topics  . Smoking status: Current Every Day Smoker -- 0.25 packs/day for 30 years    Types: Cigarettes  . Smokeless tobacco: Never Used     Comment: 5 cigs --- uses E-cig  . Alcohol Use: No     Comment: pint of wine 2-3 times a week--ocassional  . Drug Use: 1.00 per week    Special: "Crack" cocaine     Comment: trying to quit---- quit November 2013   . Sexual Activity: Yes   Other Topics Concern  . Not on file   Social History Narrative   Lives with mom, sisters and brothers.    Review of Systems  Constitutional: Negative for fever, chills, diaphoresis, activity change, appetite change and fatigue.  HENT: Negative for ear pain, nosebleeds, congestion, facial swelling, rhinorrhea, neck pain, neck stiffness and ear discharge.   Eyes: Negative for pain, discharge, redness, itching and visual disturbance.  Respiratory: Negative for cough, choking, chest tightness, shortness of breath, wheezing and stridor.   Cardiovascular: Negative for chest pain, palpitations and leg swelling.  Gastrointestinal: Negative for abdominal distention.  Genitourinary: Negative for dysuria, urgency, frequency, hematuria, flank pain, decreased urine volume, difficulty urinating and dyspareunia.  Musculoskeletal: Negative for back pain, joint swelling, arthralgias and gait problem.  Neurological: Negative for dizziness, tremors, seizures, syncope, facial asymmetry, speech difficulty, weakness, light-headedness, numbness and headaches.  Hematological: Negative for adenopathy. Does not bruise/bleed easily.  Psychiatric/Behavioral: Negative for hallucinations, behavioral problems, confusion, dysphoric mood, decreased  concentration and agitation.    Objective:   Filed Vitals:   04/11/13 1721  BP: 119/84  Pulse: 91  Temp: 98.6 F (37 C)  Resp: 14    Physical Exam  Constitutional: Appears well-developed and well-nourished. No distress.  HENT: Normocephalic. External right and left ear normal. Oropharynx is clear and moist.  Eyes: Conjunctivae and EOM are normal. PERRLA, no scleral icterus.  Neck: Normal ROM. Neck supple. No JVD. No tracheal deviation. No thyromegaly.  CVS: RRR, S1/S2 +, no murmurs, no gallops, no carotid bruit.  Pulmonary: Effort and breath sounds normal, no stridor, rhonchi, wheezes, rales.  Abdominal: Soft. BS +,  no distension, tenderness, rebound or guarding.  Musculoskeletal: Normal range of motion. No edema  and no tenderness.  Lymphadenopathy: No lymphadenopathy noted, cervical, inguinal. Neuro: Alert. Normal reflexes, muscle tone coordination. No cranial nerve deficit. Skin: Skin is warm and dry. No rash noted. Not diaphoretic. No erythema. No pallor.  Psychiatric: Normal mood and affect. Behavior, judgment, thought content normal.   Lab Results  Component Value Date   WBC 2.0* 04/05/2013   HGB 8.6* 04/05/2013   HCT 27.2* 04/05/2013   MCV 77.9* 04/05/2013   PLT 222 04/05/2013   Lab Results  Component Value Date   CREATININE 1.08 04/05/2013   BUN 8 04/05/2013   NA 133* 04/05/2013   K 4.4 04/05/2013   CL 101 04/05/2013   CO2 20 04/05/2013    Lab Results  Component Value Date   HGBA1C 5.5 04/06/2012   Lipid Panel     Component Value Date/Time   CHOL 162 04/07/2012 0221   TRIG 100 04/07/2012 0221   HDL 45 04/07/2012 0221   CHOLHDL 3.6 04/07/2012 0221   VLDL 20 04/07/2012 0221   LDLCALC 97 04/07/2012 0221       Assessment and plan:   Patient Active Problem List   Diagnosis Date Noted  . Unspecified deficiency anemia 03/29/2013  . Lymphadenopathy 03/21/2013  . Leukopenia 03/21/2013  . Fibroid uterus 09/14/2012  . Perimenopause 09/14/2012  . Chronic cough  08/12/2012  . DOE (dyspnea on exertion) 08/12/2012  . CVA (cerebral infarction) 08/09/2012  . Chest pain, mid sternal 04/07/2012  . Pulmonary hypertension 04/06/2012  . Polysubstance abuse 04/06/2012  . Acute bronchitis 04/06/2012  . Cocaine abuse 11/16/2011  . Major depressive disorder, recurrent episode with psychotic features. 11/15/2011    Class: Acute  . Abscess and cellulitis 08/09/2011  . Chronic diastolic heart failure 29/52/8413  . CAD (coronary artery disease) 02/24/2011  . Hyperlipidemia 01/06/2011  . Hypertension 12/15/2010  . Chest pain 12/15/2010  . Exertional dyspnea 12/15/2010  . Smoking 12/15/2010   Leukopenia/neutropenia Patient is under an appointment with hematology oncology on the 29th Recent labs therefore will not repeat   diffuse lymphadenopathy Biopsy results available, however I told the patient that needs to hear the results from ENT and oncology  Smoking cessation Encouraged smoking cessation  Routine followup in 3 months      The patient was given clear instructions to go to ER or return to medical center if symptoms don't improve, worsen or new problems develop. The patient verbalized understanding. The patient was told to call to get any lab results if not heard anything in the next week.

## 2013-04-11 NOTE — Progress Notes (Signed)
Pt is here for a f/u. Pt does not have any complaints.

## 2013-04-11 NOTE — Telephone Encounter (Signed)
Pt states missed her appt w/ Dr. Alvy Bimler last week due to having a Biopsy done on same day.  She would like to re-schedule.

## 2013-04-11 NOTE — Telephone Encounter (Signed)
Please reschedule to Jan 29th with lab appt

## 2013-04-12 ENCOUNTER — Other Ambulatory Visit: Payer: Self-pay | Admitting: Hematology and Oncology

## 2013-04-12 ENCOUNTER — Telehealth: Payer: Self-pay | Admitting: Hematology and Oncology

## 2013-04-12 DIAGNOSIS — D72819 Decreased white blood cell count, unspecified: Secondary | ICD-10-CM

## 2013-04-12 DIAGNOSIS — R591 Generalized enlarged lymph nodes: Secondary | ICD-10-CM

## 2013-04-12 NOTE — Telephone Encounter (Signed)
Informed pt of order placed for lab and appt w/ Dr. Alvy Bimler on 1/29.  Expect a call from [facility].  She verbalized understanding.

## 2013-04-12 NOTE — Telephone Encounter (Signed)
s/w pt re appt for 1/29 @ 9:15am.

## 2013-04-16 ENCOUNTER — Emergency Department (HOSPITAL_COMMUNITY): Payer: No Typology Code available for payment source

## 2013-04-16 ENCOUNTER — Emergency Department (HOSPITAL_COMMUNITY)
Admission: EM | Admit: 2013-04-16 | Discharge: 2013-04-16 | Disposition: A | Discharge status: Home / Self Care | Payer: No Typology Code available for payment source | Attending: Emergency Medicine | Admitting: Emergency Medicine

## 2013-04-16 ENCOUNTER — Encounter (HOSPITAL_COMMUNITY): Payer: Self-pay | Admitting: Emergency Medicine

## 2013-04-16 DIAGNOSIS — I2510 Atherosclerotic heart disease of native coronary artery without angina pectoris: Secondary | ICD-10-CM | POA: Insufficient documentation

## 2013-04-16 DIAGNOSIS — Z8719 Personal history of other diseases of the digestive system: Secondary | ICD-10-CM | POA: Insufficient documentation

## 2013-04-16 DIAGNOSIS — Z8639 Personal history of other endocrine, nutritional and metabolic disease: Secondary | ICD-10-CM | POA: Insufficient documentation

## 2013-04-16 DIAGNOSIS — R42 Dizziness and giddiness: Secondary | ICD-10-CM | POA: Insufficient documentation

## 2013-04-16 DIAGNOSIS — Y9301 Activity, walking, marching and hiking: Secondary | ICD-10-CM | POA: Insufficient documentation

## 2013-04-16 DIAGNOSIS — Z95818 Presence of other cardiac implants and grafts: Secondary | ICD-10-CM | POA: Insufficient documentation

## 2013-04-16 DIAGNOSIS — J029 Acute pharyngitis, unspecified: Secondary | ICD-10-CM | POA: Insufficient documentation

## 2013-04-16 DIAGNOSIS — IMO0002 Reserved for concepts with insufficient information to code with codable children: Secondary | ICD-10-CM | POA: Insufficient documentation

## 2013-04-16 DIAGNOSIS — X58XXXA Exposure to other specified factors, initial encounter: Secondary | ICD-10-CM | POA: Insufficient documentation

## 2013-04-16 DIAGNOSIS — Y929 Unspecified place or not applicable: Secondary | ICD-10-CM | POA: Insufficient documentation

## 2013-04-16 DIAGNOSIS — Z79899 Other long term (current) drug therapy: Secondary | ICD-10-CM | POA: Insufficient documentation

## 2013-04-16 DIAGNOSIS — M129 Arthropathy, unspecified: Secondary | ICD-10-CM | POA: Insufficient documentation

## 2013-04-16 DIAGNOSIS — I10 Essential (primary) hypertension: Secondary | ICD-10-CM | POA: Insufficient documentation

## 2013-04-16 DIAGNOSIS — F411 Generalized anxiety disorder: Secondary | ICD-10-CM | POA: Insufficient documentation

## 2013-04-16 DIAGNOSIS — Z8673 Personal history of transient ischemic attack (TIA), and cerebral infarction without residual deficits: Secondary | ICD-10-CM | POA: Insufficient documentation

## 2013-04-16 DIAGNOSIS — F3289 Other specified depressive episodes: Secondary | ICD-10-CM | POA: Insufficient documentation

## 2013-04-16 DIAGNOSIS — I5032 Chronic diastolic (congestive) heart failure: Secondary | ICD-10-CM | POA: Insufficient documentation

## 2013-04-16 DIAGNOSIS — Z862 Personal history of diseases of the blood and blood-forming organs and certain disorders involving the immune mechanism: Secondary | ICD-10-CM | POA: Insufficient documentation

## 2013-04-16 DIAGNOSIS — Z7982 Long term (current) use of aspirin: Secondary | ICD-10-CM | POA: Insufficient documentation

## 2013-04-16 DIAGNOSIS — J45901 Unspecified asthma with (acute) exacerbation: Secondary | ICD-10-CM | POA: Insufficient documentation

## 2013-04-16 DIAGNOSIS — D509 Iron deficiency anemia, unspecified: Secondary | ICD-10-CM | POA: Insufficient documentation

## 2013-04-16 DIAGNOSIS — F329 Major depressive disorder, single episode, unspecified: Secondary | ICD-10-CM | POA: Insufficient documentation

## 2013-04-16 DIAGNOSIS — F17200 Nicotine dependence, unspecified, uncomplicated: Secondary | ICD-10-CM | POA: Insufficient documentation

## 2013-04-16 LAB — CBC
HCT: 26.4 % — ABNORMAL LOW (ref 36.0–46.0)
Hemoglobin: 8.3 g/dL — ABNORMAL LOW (ref 12.0–15.0)
MCH: 24.6 pg — ABNORMAL LOW (ref 26.0–34.0)
MCHC: 31.4 g/dL (ref 30.0–36.0)
MCV: 78.1 fL (ref 78.0–100.0)
Platelets: 206 10*3/uL (ref 150–400)
RBC: 3.38 MIL/uL — ABNORMAL LOW (ref 3.87–5.11)
RDW: 19.9 % — ABNORMAL HIGH (ref 11.5–15.5)
WBC: 2.4 10*3/uL — ABNORMAL LOW (ref 4.0–10.5)

## 2013-04-16 LAB — BASIC METABOLIC PANEL
BUN: 13 mg/dL (ref 6–23)
CO2: 22 mEq/L (ref 19–32)
Calcium: 9.4 mg/dL (ref 8.4–10.5)
Chloride: 99 mEq/L (ref 96–112)
Creatinine, Ser: 0.87 mg/dL (ref 0.50–1.10)
GFR calc Af Amer: 89 mL/min — ABNORMAL LOW (ref >90)
GFR calc non Af Amer: 77 mL/min — ABNORMAL LOW (ref >90)
Glucose, Bld: 97 mg/dL (ref 70–99)
Potassium: 4.1 mEq/L (ref 3.7–5.3)
Sodium: 136 mEq/L — ABNORMAL LOW (ref 137–147)

## 2013-04-16 LAB — PRO B NATRIURETIC PEPTIDE: Pro B Natriuretic peptide (BNP): 415.6 pg/mL — ABNORMAL HIGH (ref 0–125)

## 2013-04-16 LAB — TROPONIN I: Troponin I: <0.3 ng/mL (ref <0.30)

## 2013-04-16 MED ORDER — AMOXICILLIN-POT CLAVULANATE 875-125 MG PO TABS: 1.0000 | ORAL_TABLET | Freq: Two times a day (BID) | ORAL | Status: DC

## 2013-04-16 MED ORDER — IPRATROPIUM BROMIDE 0.02 % IN SOLN
1.0000 mg | Freq: Once | RESPIRATORY_TRACT | Status: AC
  Administered 2013-04-16: 1 mg via RESPIRATORY_TRACT
  Filled 2013-04-16: qty 5

## 2013-04-16 MED ORDER — METHYLPREDNISOLONE SODIUM SUCC 125 MG IJ SOLR
125.0000 mg | Freq: Once | INTRAMUSCULAR | Status: AC
  Administered 2013-04-16: 125 mg via INTRAVENOUS
  Filled 2013-04-16: qty 2

## 2013-04-16 MED ORDER — PREDNISONE 20 MG PO TABS: 40.0000 mg | ORAL_TABLET | Freq: Every day | ORAL | Status: DC

## 2013-04-16 MED ORDER — ALBUTEROL (5 MG/ML) CONTINUOUS INHALATION SOLN
15.0000 mg/h | INHALATION_SOLUTION | RESPIRATORY_TRACT | Status: AC
  Administered 2013-04-16: 15 mg/h via RESPIRATORY_TRACT
  Filled 2013-04-16: qty 20

## 2013-04-16 MED ORDER — SODIUM CHLORIDE 0.9 % IV BOLUS (SEPSIS): 500.0000 mL | Freq: Once | INTRAVENOUS | Status: DC

## 2013-04-16 MED ORDER — IOHEXOL 300 MG/ML  SOLN
100.0000 mL | Freq: Once | INTRAMUSCULAR | Status: AC | PRN
  Administered 2013-04-16: 100 mL via INTRAVENOUS

## 2013-04-16 MED ORDER — ALBUTEROL SULFATE HFA 108 (90 BASE) MCG/ACT IN AERS
2.0000 | INHALATION_SPRAY | RESPIRATORY_TRACT | Status: DC | PRN
  Administered 2013-04-16: 2 via RESPIRATORY_TRACT
  Filled 2013-04-16: qty 6.7

## 2013-04-16 NOTE — ED Provider Notes (Signed)
Medical screening examination/treatment/procedure(s) were conducted as a shared visit with non-physician practitioner(s) and myself.  I personally evaluated the patient during the encounter.  EKG Interpretation    Date/Time:  Sunday April 16 2013 16:22:01 EST Ventricular Rate:  96 PR Interval:  143 QRS Duration: 86 QT Interval:  344 QTC Calculation: 435 R Axis:   37 Text Interpretation:  Normal sinus rhythm No significant change since last tracing Confirmed by Roger Kettles  MD, Zahava Quant ([id number]) on 04/16/2013 6:03:41 PM            I interviewed and examined the patient. Lungs are CTAB on my exam after breathing tx. Cardiac exam wnl. Abdomen soft. Pt w/ multiple comorbidities and being worked up for [place]. She remains mildly tachypneic after breathing tx w/ clear lungs. Will get CTA to r/o PE given initial tachycardia, possible lymphoma, recent surgical procedure.   CTA neg for PE, possible infectious etiology. Pt already on azithro, will add augmentin. Pt continues to appear well. Likely asthma exac. Will try outpt tx w/ abx, steroids, continued inhaler use. Doubt pulm htn issue as she is 100% now on RA and feeling better overall. Family and pt happy w/ plan.   Blanchard Kelch, MD 04/16/13 [phone]

## 2013-04-16 NOTE — ED Notes (Signed)
Pt presents with NAD- Pt Had biopsy 10 days ago. Seen by [facility] 04/11/2013. Saw ENT 04/14/2013 that stated not procedure from biopsy( lymph node). Pt has been using albuterol without relief. Pt c/o of chills fever cold like symptoms. Did not get flu shot.

## 2013-04-16 NOTE — ED Provider Notes (Signed)
CSN: [id number]     Arrival date & time 04/16/13  1536 History   First MD Initiated Contact with Patient 04/16/13 1550     Chief Complaint  Patient presents with  . Asthma   (Consider location/radiation/quality/duration/timing/severity/associated sxs/prior Treatment) HPI Pt is a 48yo female with hx of CAD, CHF, HTN, pulmonary HTN, asthma, and lymphadenopathy presenting with 1 week hx of SOB and URI type symptoms including subjective fever, chills, generalized weakness, body aches, and productive cough. Reports symptoms started last week after being seen by her ENT for lymph node biopsy which came back benign. Pt states also being seen earlier this week by her PCP, Dr. Doreene Burke and prescribed azithromycin, has 2 days left of antibiotic but has not had any improvement in her SOB.  Reports mild chest pain and soreness with cough and inspiration. Has tried home albuterol treatments w/o relief.  Daughter went to check on pt this morning and noticed she "sounded like death" and was very weak, near syncope when walking to the car resulting in an abrasion to her right knee. Denies hitting head or LOC.  Pt reports having appointment with [facility] on this Thursday, 1/29.  Denies recent hospitalization, sick contacts, recent travel, or hx of PE or DVT.  Past Medical History  Diagnosis Date  . Coronary artery disease     Lexiscan myoview (10/12) with significant ST depression upon Lexiscan injection but no ischemia or infarction on perfusion images. Left heart cath (10/12): 30% mid LAD, 30% ostial D1, 50% ostial D2.    [name] Hypertension   . Tricuspid regurgitation   . Iron deficiency     hx of  . Polysubstance abuse     Prior cocaine  . Tobacco abuse   . Leukocytoclastic vasculitis     Rash across lower body, occurred in 9/11, diagnosed by skin biopsy. ANA positive. Thought to be secondary to cocaine use.  . Pulmonary HTN     Echo (9/11) with EF 65%, mild LVH, mild AI, mild MR, moderate to possibly  severe TR with PA systolic pressure 52 mmHg. Echo (10/12): severe LV hypertrophy, EF 60-65%, mild MR, mild AI, moderate to severe tricuspid regurgitation, PA systolic pressure 38 mmHg.  Echo 4/14: moderate LVH, EF 65%, normal wall motion, diastolic dysfunction, mild AI, mild MR  . Asthma   . Shortness of breath     a. PFTs 5/14:  FEV1/FVC 99% predicted; FVC 60% predicted; DLCO mildly reduced, mild restriction, air trapping.;  b.  seen by pulmo (Dr. [name]) 5/14: mainly upper airway symptoms - ACE d/c'd (sx's better)  . Chronic diastolic CHF (congestive heart failure)   . Constipation   . Lymphadenopathy 03/21/2013  . Leukopenia 03/21/2013  . Unspecified deficiency anemia 03/29/2013  . Anginal pain     none in past year  . Anxiety   . Mental disorder   . Depression   . Stroke 2010ish  . Arthritis   . Pancytopenia   . Hyperlipemia    Past Surgical History  Procedure Laterality Date  . Ankle surgery      right  . Cardiac catheterization    . I&d extremity  08/09/2011    Procedure: IRRIGATION AND DEBRIDEMENT EXTREMITY;  Surgeon: [name], MD;  Location: [facility];  Service: General;  Laterality: Left;  i & D Left axilla abscess  . Lymph node biopsy N/A 04/05/2013    Procedure: EXCISIONAL BIOPSY RIGHT SUBMANDIBULAR NODE, NASAL ENDOSCOPIC WITH BIOPSY NASAL PHARYNX;  Surgeon: [name], MD;  Location: [facility];  Service: ENT;  Laterality: N/A;   Family History  Problem Relation Age of Onset  . Coronary artery disease Mother   . Diabetes Mother   . Breast cancer Maternal Grandmother   . Diabetes Maternal Grandmother   . Diabetes Father   . Hypertension Father   . Coronary artery disease Father    History  Substance Use Topics  . Smoking status: Current Every Day Smoker -- 0.25 packs/day for 30 years    Types: Cigarettes  . Smokeless tobacco: Never Used     Comment: 5 cigs --- uses E-cig  . Alcohol Use: No     Comment: pint of wine 2-3 times a week--ocassional   OB History    Grav Para Term Preterm Abortions TAB SAB Ect Mult Living   4 2 2  2 2    2      Review of Systems  Constitutional: Positive for chills. Negative for fever.  HENT: Positive for sore throat and voice change. Negative for trouble swallowing.   Respiratory: Positive for cough, shortness of breath and wheezing. Negative for stridor.   Cardiovascular: Positive for chest pain. Negative for palpitations and leg swelling.  Gastrointestinal: Negative for nausea, vomiting, abdominal pain and diarrhea.  Musculoskeletal: Positive for myalgias.  Neurological: Positive for weakness ( generalized) and light-headedness.  All other systems reviewed and are negative.    Allergies  Ciprofloxacin  Home Medications   Current Outpatient Rx  Name  Route  Sig  Dispense  Refill  . albuterol (PROVENTIL HFA;VENTOLIN HFA) 108 (90 BASE) MCG/ACT inhaler   Inhalation   Inhale 2 puffs into the lungs every 4 (four) hours as needed for wheezing or shortness of breath.         [name] amLODipine (NORVASC) 10 MG tablet      TAKE ONE TABLET DAILY   30 tablet   0     Call for appointment Richardson Dopp PA Jan 2015   . aspirin 81 MG tablet   Oral   Take 81 mg by mouth daily.         [name] buPROPion (WELLBUTRIN) 100 MG tablet   Oral   Take 100 mg by mouth 2 (two) times daily.         . citalopram (CELEXA) 40 MG tablet   Oral   Take 40 mg by mouth daily.         . cloNIDine (CATAPRES) 0.3 MG tablet   Oral   Take 0.3 mg by mouth 2 (two) times daily.         . ferrous sulfate 325 (65 FE) MG tablet   Oral   Take 1 tablet (325 mg total) by mouth 2 (two) times daily with a meal.   60 tablet   3   . furosemide (LASIX) 20 MG tablet   Oral   Take 1 tablet (20 mg total) by mouth 2 (two) times daily.   60 tablet   11   . hydrOXYzine (ATARAX/VISTARIL) 25 MG tablet   Oral   Take 25 mg by mouth at bedtime as needed (sleep).         . loratadine (CLARITIN) 10 MG tablet   Oral   Take 1 tablet (10 mg  total) by mouth daily.   30 tablet   3   . Multiple Vitamin (MULTIVITAMIN WITH MINERALS) TABS   Oral   Take 1 tablet by mouth daily.         . polyethylene glycol  powder (GLYCOLAX/MIRALAX) powder   Oral   Take 17 g by mouth daily as needed (constipation).          . potassium chloride (K-DUR,KLOR-CON) 10 MEQ tablet   Oral   Take 1 tablet (10 mEq total) by mouth 2 (two) times daily with a meal.   60 tablet   11   . traZODone (DESYREL) 100 MG tablet   Oral   Take 100 mg by mouth at bedtime.         [name] amoxicillin-clavulanate (AUGMENTIN) 875-125 MG per tablet   Oral   Take 1 tablet by mouth 2 (two) times daily.   14 tablet   0   . predniSONE (DELTASONE) 20 MG tablet   Oral   Take 2 tablets (40 mg total) by mouth daily.   5 tablet   0    BP 124/95  Pulse 99  Temp(Src) 98.7 F (37.1 C) (Oral)  Resp 19  SpO2 100%  LMP 04/05/2013 Physical Exam  Nursing note and vitals reviewed. Constitutional: She appears well-developed and well-nourished.  Pt sitting in exam bed, continuous neb in place. Mild to moderate respiratory distress.  HENT:  Head: Normocephalic and atraumatic.  Eyes: Conjunctivae are normal. No scleral icterus.  Neck: Normal range of motion.  Cardiovascular: Normal rate, regular rhythm and normal heart sounds.   Pulmonary/Chest: She is in respiratory distress ( mild). She has wheezes. She has no rales. She exhibits no tenderness.  Pt speaking in short sentences, taking breaths between. Nebulizer tx in place during exam. Wheezing audible w/o stethoscope. Diffuse wheezes w/o rhonchi.     Abdominal: Soft. Bowel sounds are normal. She exhibits no distension and no mass. There is no tenderness. There is no rebound and no guarding.  Musculoskeletal: Normal range of motion. She exhibits tenderness. She exhibits no edema.  Neurological: She is alert.  Skin: Skin is warm and dry. She is not diaphoretic.  Abrasion over right knee, mild edema.    ED Course   Procedures (including critical care time) Labs Review Labs Reviewed  PRO B NATRIURETIC PEPTIDE - Abnormal; Notable for the following:    Pro B Natriuretic peptide (BNP) 415.6 (*)    All other components within normal limits  CBC - Abnormal; Notable for the following:    WBC 2.4 (*)    RBC 3.38 (*)    Hemoglobin 8.3 (*)    HCT 26.4 (*)    MCH 24.6 (*)    RDW 19.9 (*)    All other components within normal limits  BASIC METABOLIC PANEL - Abnormal; Notable for the following:    Sodium 136 (*)    GFR calc non Af Amer 77 (*)    GFR calc Af Amer 89 (*)    All other components within normal limits  TROPONIN I   Imaging Review Dg Chest 2 View  04/16/2013   CLINICAL DATA:  Shortness of breath.  EXAM: CHEST  2 VIEW  COMPARISON:  02/15/2013  FINDINGS: Lungs are hypoinflated without focal consolidation or effusion. Cardiomediastinal silhouette and remainder of the exam is unchanged.  IMPRESSION: No active cardiopulmonary disease.   Electronically Signed   By: Marin Olp M.D.   On: 04/16/2013 17:24   Ct Angio Chest Pe W/cm &/or Wo Cm  04/16/2013   CLINICAL DATA:  Shortness of breath.  EXAM: CT ANGIOGRAPHY CHEST WITH CONTRAST  TECHNIQUE: Multidetector CT imaging of the chest was performed using the standard protocol during bolus administration of intravenous contrast. Multiplanar  CT image reconstructions including MIPs were obtained to evaluate the vascular anatomy.  CONTRAST:  138mL OMNIPAQUE IOHEXOL 300 MG/ML  SOLN  COMPARISON:  PET-CT 03/29/2013  FINDINGS: Lungs are adequately inflated with minimal bibasilar linear atelectasis. There is mild airspace density over the posterior right upper lobe adjacent the major fissure which may be due to dependent atelectasis versus early infection.  There is mild cardiomegaly unchanged. There is no evidence of pulmonary embolism. There is no hilar or mediastinal adenopathy. The no significant axillary adenopathy.  Images through the upper abdomen are  unremarkable. Remainder of the exam is unchanged.  Review of the MIP images confirms the above findings.  IMPRESSION: No evidence of pulmonary embolism.  Subtle airspace density over the posterior right upper lobe adjacent to major fissure likely dependent atelectasis although cannot exclude early infection. Bibasilar linear atelectasis.  Mild stable cardiomegaly.   Electronically Signed   By: Marin Olp M.D.   On: 04/16/2013 19:06    EKG Interpretation    Date/Time:  Sunday April 16 2013 16:22:01 EST Ventricular Rate:  96 PR Interval:  143 QRS Duration: 86 QT Interval:  344 QTC Calculation: 435 R Axis:   37 Text Interpretation:  Normal sinus rhythm No significant change since last tracing Confirmed by HARRISON  MD, FORREST ([id number]) on 04/16/2013 6:03:41 PM            MDM   1. Asthma exacerbation    Pt with extensive cardiopulmonary hx including CHF and pulmonary HTN presenting with persistent SOB x1 week. Currently taking azithromycin from PCP w/o relief.  Pt currently being evaluated by cancer center for possible lymphoma.  Pt started on continuous albuterol and atrovent neb and solumedrol upon arrival to ED due to audible wheezing.    Wheezing did improve after breathing tx however pt still c/o SOB.  Vitals: mild tachycardia- 106, but O2 99-100% on RA. CXR: unremarkable. Labs: CBC, BMP, BNP-consistent with pt's baseline. Troponin: negative.  Discussed pt with Dr. Aline Brochure who also examined pt, will get CT angio chest due to continued SOB and mild tachycardia.  CT angio chest: possible early infection, otherwise, no acute findgings. Mild stable cardiomegaly.    Will discharge pt home and tx for possible pneumonia. Advised to finish azithromycin, will also start on Augmentin BIB x 7 days.  Also provided albuterol inhaler refill. Will have pt f/u with PCP in 2 days and f/u with cancer center on Thursday, 1/29 as scheduled.  Return precautions provided. Pt and family verbalized  understanding and agreement with tx plan.      Noland Fordyce, PA-C 04/16/13 1945

## 2013-04-16 NOTE — Discharge Instructions (Signed)
Asthma, Adult Asthma is a condition of the lungs in which the airways tighten and narrow. Asthma can make it hard to breathe. Asthma cannot be cured, but medicine and lifestyle changes can help control it. Asthma may be started (triggered) by:  Animal skin flakes (dander).  Dust.  Cockroaches.  Pollen.  Mold.  Smoke.  Cleaning products.  Hair sprays or aerosol sprays.  Paint fumes or strong smells.  Cold air, weather changes, and winds.  Crying or laughing hard.  Stress.  Certain medicines or drugs.  Foods, such as dried fruit, potato chips, and sparkling grape juice.  Infections or conditions (colds, flu).  Exercise.  Certain medical conditions or diseases.  Exercise or tiring activities. HOME CARE   Take medicine as told by your doctor.  Use a peak flow meter as told by your doctor. A peak flow meter is a tool that measures how well the lungs are working.  Record and keep track of the peak flow meter's readings.  Understand and use the asthma action plan. An asthma action plan is a written plan for taking care of your asthma and treating your attacks.  To help prevent asthma attacks:  Do not smoke. Stay away from secondhand smoke.  Change your heating and air conditioning filter often.  Limit your use of fireplaces and wood stoves.  Get rid of pests (such as roaches and mice) and their droppings.  Throw away plants if you see mold on them.  Clean your floors. Dust regularly. Use cleaning products that do not smell.  Have someone vacuum when you are not home. Use a vacuum cleaner with a HEPA filter if possible.  Replace carpet with wood, tile, or vinyl flooring. Carpet can trap animal skin flakes and dust.  Use allergy-proof pillows, mattress covers, and box spring covers.  Wash bed sheets and blankets every week in hot water and dry them in a dryer.  Use blankets that are made of polyester or cotton.  Clean bathrooms and kitchens with bleach.  If possible, have someone repaint the walls in these rooms with mold-resistant paint. Keep out of the rooms that are being cleaned and painted.  Wash hands often. GET HELP IF:  You have make a whistling sound when breaking (wheeze), have shortness of breath, or have a cough even if taking medicine to prevent attacks.  The colored mucus you cough up (sputum) is thicker than usual.  The colored mucus you cough up changes from clear or white to yellow, green, gray, or bloody.  You have problems from the medicine you are taking such as:  A rash.  Itching.  Swelling.  Trouble breathing.  You need reliever medicines more than 2 3 times a week.  Your peak flow measurement is still at 50 79% of your personal best after following the action plan for 1 hour. GET HELP RIGHT AWAY IF:   You seem to be worse and are not responding to medicine during an asthma attack.  You are short of breath even at rest.  You get short of breath when doing very little activity.  You have trouble eating, drinking, or talking.  You have chest pain.  You have a fast heartbeat.  Your lips or fingernails start to turn blue.  You are lightheaded, dizzy, or faint.  Your peak flow is less than 50% of your personal best.  You have a fever or lasting symptoms for more than 2 3 days.  You have a fever and your symptoms suddenly  You have a fast heartbeat.  · Your lips or fingernails start to turn blue.  · You are lightheaded, dizzy, or faint.  · Your peak flow is less than 50% of your personal best.  · You have a fever or lasting symptoms for more than 2 3 days.  · You have a fever and your symptoms suddenly get worse.  MAKE SURE YOU:   · Understand these instructions.  · Will watch your condition.  · Will get help right away if you are not doing well or get worse.  Document Released: 08/26/2007 Document Revised: 12/28/2012 Document Reviewed: 10/06/2012  [employer]® Patient Information ©2014 [employer].

## 2013-04-20 ENCOUNTER — Encounter: Payer: No Typology Code available for payment source | Admitting: Hematology and Oncology

## 2013-04-20 ENCOUNTER — Telehealth: Payer: Self-pay | Admitting: *Deleted

## 2013-04-20 ENCOUNTER — Ambulatory Visit (HOSPITAL_BASED_OUTPATIENT_CLINIC_OR_DEPARTMENT_OTHER): Payer: No Typology Code available for payment source

## 2013-04-20 ENCOUNTER — Other Ambulatory Visit (HOSPITAL_COMMUNITY)
Admission: RE | Admit: 2013-04-20 | Discharge: 2013-04-20 | Disposition: A | Discharge status: Home / Self Care | Payer: No Typology Code available for payment source | Source: Ambulatory Visit | Attending: Hematology and Oncology | Admitting: Hematology and Oncology

## 2013-04-20 ENCOUNTER — Ambulatory Visit (HOSPITAL_BASED_OUTPATIENT_CLINIC_OR_DEPARTMENT_OTHER): Payer: No Typology Code available for payment source | Admitting: Hematology and Oncology

## 2013-04-20 ENCOUNTER — Other Ambulatory Visit: Payer: Self-pay | Admitting: Medical Oncology

## 2013-04-20 VITALS — BP 102/74 | HR 83 | Temp 98.0°F | Resp 20 | Ht 66.0 in | Wt 186.6 lb

## 2013-04-20 DIAGNOSIS — D72822 Plasmacytosis: Secondary | ICD-10-CM | POA: Insufficient documentation

## 2013-04-20 DIAGNOSIS — Z9889 Other specified postprocedural states: Secondary | ICD-10-CM

## 2013-04-20 DIAGNOSIS — D709 Neutropenia, unspecified: Secondary | ICD-10-CM | POA: Insufficient documentation

## 2013-04-20 DIAGNOSIS — D649 Anemia, unspecified: Secondary | ICD-10-CM

## 2013-04-20 DIAGNOSIS — D539 Nutritional anemia, unspecified: Secondary | ICD-10-CM

## 2013-04-20 DIAGNOSIS — D72819 Decreased white blood cell count, unspecified: Secondary | ICD-10-CM

## 2013-04-20 DIAGNOSIS — R591 Generalized enlarged lymph nodes: Secondary | ICD-10-CM

## 2013-04-20 LAB — CBC & DIFF AND RETIC
HCT: 27.8 % — ABNORMAL LOW (ref 34.8–46.6)
HGB: 9.1 g/dL — ABNORMAL LOW (ref 11.6–15.9)
Immature Retic Fract: 10.2 % — ABNORMAL HIGH (ref 1.60–10.00)
MCH: 25.3 pg (ref 25.1–34.0)
MCHC: 32.6 g/dL (ref 31.5–36.0)
MCV: 77.7 fL — AB (ref 79.5–101.0)
PLATELETS: 346 10*3/uL (ref 145–400)
RBC: 3.58 10*6/uL — ABNORMAL LOW (ref 3.70–5.45)
RDW: 21.4 % — ABNORMAL HIGH (ref 11.2–14.5)
Retic %: 2.15 % — ABNORMAL HIGH (ref 0.70–2.10)
Retic Ct Abs: 76.97 10*3/uL (ref 33.70–90.70)
WBC: 2.7 10*3/uL — ABNORMAL LOW (ref 3.9–10.3)

## 2013-04-20 LAB — MANUAL DIFFERENTIAL
ALC: 2.3 10*3/uL (ref 0.9–3.3)
ANC (CHCC MAN DIFF): 0 10*3/uL — AB (ref 1.5–6.5)
BAND NEUTROPHILS: 0 % (ref 0–10)
BASOPHIL: 0 % (ref 0–2)
BLASTS: 0 % (ref 0–0)
EOS%: 1 % (ref 0–7)
LYMPH: 85 % — ABNORMAL HIGH (ref 14–49)
MONO: 14 % (ref 0–14)
MYELOCYTES: 0 % (ref 0–0)
Metamyelocytes: 0 % (ref 0–0)
NRBC: 0 % (ref 0–0)
Other Cell: 0 % (ref 0–0)
PLT EST: ADEQUATE
PROMYELO: 0 % (ref 0–0)
SEG: 0 % — ABNORMAL LOW (ref 38–77)
Variant Lymph: 0 % (ref 0–0)

## 2013-04-20 LAB — COMPREHENSIVE METABOLIC PANEL (CC13)
ALBUMIN: 2.8 g/dL — AB (ref 3.5–5.0)
ALT: 21 U/L (ref 0–55)
ANION GAP: 9 meq/L (ref 3–11)
AST: 13 U/L (ref 5–34)
Alkaline Phosphatase: 76 U/L (ref 40–150)
BUN: 15.3 mg/dL (ref 7.0–26.0)
CALCIUM: 9.8 mg/dL (ref 8.4–10.4)
CHLORIDE: 104 meq/L (ref 98–109)
CO2: 23 meq/L (ref 22–29)
Creatinine: 0.9 mg/dL (ref 0.6–1.1)
Glucose: 95 mg/dl (ref 70–140)
POTASSIUM: 3.5 meq/L (ref 3.5–5.1)
Sodium: 137 mEq/L (ref 136–145)
Total Bilirubin: 0.21 mg/dL (ref 0.20–1.20)
Total Protein: 9.4 g/dL — ABNORMAL HIGH (ref 6.4–8.3)

## 2013-04-20 LAB — URIC ACID (CC13): URIC ACID, SERUM: 5.4 mg/dL (ref 2.6–7.4)

## 2013-04-20 LAB — RPR TITER

## 2013-04-20 LAB — KAPPA/LAMBDA LIGHT CHAINS
Kappa free light chain: 10.8 mg/dL — ABNORMAL HIGH (ref 0.33–1.94)
Kappa:Lambda Ratio: 1.33 (ref 0.26–1.65)
Lambda Free Lght Chn: 8.15 mg/dL — ABNORMAL HIGH (ref 0.57–2.63)

## 2013-04-20 LAB — ANA: Anti Nuclear Antibody(ANA): NEGATIVE

## 2013-04-20 LAB — SEDIMENTATION RATE: SED RATE: 126 mm/h — AB (ref 0–22)

## 2013-04-20 LAB — VITAMIN B12: Vitamin B-12: 1066 pg/mL — ABNORMAL HIGH (ref 211–911)

## 2013-04-20 LAB — RPR: RPR: REACTIVE — AB

## 2013-04-20 LAB — HOLD TUBE, BLOOD BANK

## 2013-04-20 LAB — BETA 2 MICROGLOBULIN, SERUM: BETA 2 MICROGLOBULIN: 2.96 mg/L — AB (ref 1.01–1.73)

## 2013-04-20 MED ORDER — OXYCODONE-ACETAMINOPHEN 5-325 MG PO TABS
ORAL_TABLET | ORAL | Status: AC
  Filled 2013-04-20: qty 2

## 2013-04-20 MED ORDER — OXYCODONE-ACETAMINOPHEN 5-325 MG PO TABS
2.0000 | ORAL_TABLET | ORAL | Status: AC
Start: 2013-04-20 — End: 2013-04-20
  Administered 2013-04-20: 2 via ORAL

## 2013-04-20 NOTE — Telephone Encounter (Signed)
appts made and printed...td 

## 2013-04-20 NOTE — Progress Notes (Signed)
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 PET/CT scan showed diffuse lymphadenopathy but the metabolic activity is low. She had a lymph node biopsy performed by ENT on 04/05/2013, suspicious for IgG disease. Recently she went to the emergency department with acute exacerbation of asthma and was placed on Augmentin and prednisone. INTERVAL HISTORY: [facility] 48 y.o. female returns for further followup. She denies any recent fever, chills, night sweats or abnormal weight loss Still has persistent fatigue. She is attempting to quit smoking.  I have reviewed the past medical history, past surgical history, social history and family history with the patient and they are unchanged from previous note.  ALLERGIES:  is allergic to ciprofloxacin.  MEDICATIONS:  Current Outpatient Prescriptions  Medication Sig Dispense Refill  . albuterol (PROVENTIL HFA;VENTOLIN HFA) 108 (90 BASE) MCG/ACT inhaler Inhale 2 puffs into the lungs every 4 (four) hours as needed for wheezing or shortness of breath.      [name] amLODipine (NORVASC) 10 MG tablet TAKE ONE TABLET DAILY  30 tablet  0  . amoxicillin-clavulanate (AUGMENTIN) 875-125 MG per tablet Take 1 tablet by mouth 2 (two) times daily.  14 tablet  0  . aspirin 81 MG tablet Take 81 mg by mouth daily.      [name] buPROPion (WELLBUTRIN) 100 MG tablet Take 100 mg by mouth 2 (two) times daily.      . citalopram (CELEXA) 40 MG tablet Take 40 mg by mouth daily.      . cloNIDine (CATAPRES) 0.3 MG tablet Take 0.3 mg by mouth 2 (two) times daily.      . ferrous sulfate 325 (65 FE) MG tablet Take 1 tablet (325 mg total) by mouth 2 (two) times daily with a meal.  60 tablet  3  . furosemide (LASIX) 20 MG  tablet Take 1 tablet (20 mg total) by mouth 2 (two) times daily.  60 tablet  11  . hydrOXYzine (ATARAX/VISTARIL) 25 MG tablet Take 25 mg by mouth at bedtime as needed (sleep).      . loratadine (CLARITIN) 10 MG tablet Take 1 tablet (10 mg total) by mouth daily.  30 tablet  3  . Multiple Vitamin (MULTIVITAMIN WITH MINERALS) TABS Take 1 tablet by mouth daily.      . polyethylene glycol powder (GLYCOLAX/MIRALAX) powder Take 17 g by mouth daily as needed (constipation).       . potassium chloride (K-DUR,KLOR-CON) 10 MEQ tablet Take 1 tablet (10 mEq total) by mouth 2 (two) times daily with a meal.  60 tablet  11  . predniSONE (DELTASONE) 20 MG tablet Take 2 tablets (40 mg total) by mouth daily.  5 tablet  0  . traZODone (DESYREL) 100 MG tablet Take 100 mg by mouth at bedtime.       No current facility-administered medications for this visit.     REVIEW OF SYSTEMS:   Behavioral/Psych: Mood is stable, no new changes  All other systems were reviewed with the patient and are negative.  PHYSICAL EXAMINATION: ECOG PERFORMANCE STATUS: 1 - Symptomatic but completely ambulatory  Filed Vitals:   04/20/13 0959  BP: 102/74  Pulse: 83  Temp: 98 F (36.7 C)  Resp: 20   Filed Weights   04/20/13 0959  Weight: 186 lb 9.6 oz (84.641  kg)    GENERAL:alert, no distress and comfortable Musculoskeletal:no cyanosis of digits and no clubbing  NEURO: alert & oriented x 3 with fluent speech, no focal motor/sensory deficits  LABORATORY DATA:  I have reviewed the data as listed Results for orders placed in visit on 04/20/13 (from the past 48 hour(s))  CBC & DIFF AND RETIC     Status: Abnormal   Collection Time    04/20/13  9:17 AM      Result Value Range   WBC 2.7 (*) 3.9 - 10.3 10e3/uL   HGB 9.1 (*) 11.6 - 15.9 g/dL   HCT 27.8 (*) 34.8 - 46.6 %   Platelets 346  145 - 400 10e3/uL   MCV 77.7 (*) 79.5 - 101.0 fL   MCH 25.3  25.1 - 34.0 pg   MCHC 32.6  31.5 - 36.0 g/dL   RBC 3.58 (*) 3.70 - 5.45 10e6/uL    RDW 21.4 (*) 11.2 - 14.5 %   Retic % 2.15 (*) 0.70 - 2.10 %   Retic Ct Abs 76.97  33.70 - 90.70 10e3/uL   Immature Retic Fract 10.20 (*) 1.60 - 10.00 %  HOLD TUBE, BLOOD BANK     Status: None   Collection Time    04/20/13  9:17 AM      Result Value Range   Hold Tube, Blood Bank Blood Bank Order Cancelled    MANUAL DIFFERENTIAL     Status: Abnormal   Collection Time    04/20/13  9:17 AM      Result Value Range   ANC ([facility] manual diff) 0.0 (*) 1.5 - 6.5 10e3/uL   ALC 2.3  0.9 - 3.3 10e3/uL   SEG 0 (*) 38 - 77 %   Band Neutrophils 0  0 - 10 %   LYMPH 85 (*) 14 - 49 %   MONO 14  0 - 14 %   EOS 1  0 - 7 %   Basophil 0  0 - 2 %   Metamyelocytes 0  0 - 0 %   Myelocytes 0  0 - 0 %   PROMYELO 0  0 - 0 %   Blasts 0  0 - 0 %   Variant Lymph 0  0 - 0 %   Other Cell 0  0 - 0 %   nRBC 0  0 - 0 %   Polychromasia Slight  Slight   RBC Comments Rouleaux Present  Within Normal Limits   White Cell Comments Variant Lymphs, some with nucleoli     Comment: Result amended from (Variant Lymphs) (00/00/00 12:00 AM) to (Variant Lymphs, some with nucleoli).Result amended from () (00/00/00 12:00 AM) to (Variant Lymphs).   PLT EST Adequate  Adequate   Platelet Morphology few Large Platelets  Within Normal Limits  COMPREHENSIVE METABOLIC PANEL (HK74)     Status: Abnormal   Collection Time    04/20/13  9:18 AM      Result Value Range   Sodium 137  136 - 145 mEq/L   Potassium 3.5  3.5 - 5.1 mEq/L   Chloride 104  98 - 109 mEq/L   CO2 23  22 - 29 mEq/L   Glucose 95  70 - 140 mg/dl   BUN 15.3  7.0 - 26.0 mg/dL   Creatinine 0.9  0.6 - 1.1 mg/dL   Total Bilirubin 0.21  0.20 - 1.20 mg/dL   Alkaline Phosphatase 76  40 - 150 U/L   AST 13  5 - 34 U/L   ALT 21  0 - 55 U/L   Total Protein 9.4 (*) 6.4 - 8.3 g/dL   Albumin 2.8 (*) 3.5 - 5.0 g/dL   Calcium 9.8  8.4 - 10.4 mg/dL   Anion Gap 9  3 - 11 mEq/L  URIC ACID (CC13)     Status: None   Collection Time    04/20/13  9:18 AM      Result Value Range    Uric Acid, Serum 5.4  2.6 - 7.4 mg/dl    Lab Results  Component Value Date   WBC 2.7* 04/20/2013   HGB 9.1* 04/20/2013   HCT 27.8* 04/20/2013   MCV 77.7* 04/20/2013   PLT 346 04/20/2013    RADIOGRAPHIC STUDIES: I reviewed her most recent PET/CT scan with the patient and her daughter I have personally reviewed the radiological images as listed and agreed with the findings in the report.  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 Due to his severe neutropenia, I will proceed with bone marrow aspirate and biopsy.  Informed consent was obtained and potential risks including bleeding, infection and pain were reviewed with the patient. I verified that the patient has been fasting since midnight.  The patient's name, date of birth, identification, consent and allergies were verified prior to the start of procedure and time out was performed.  The left posterior iliac crest was chosen as the site of biopsy.  The skin was prepped with Betadine solution.   8 cc of 2% lidocaine was used to provide local anaesthesia.   5 cc of bone marrow aspirate was obtained followed by 1 inch biopsy. Dry tap was encountered and 5 attempts were made in total  The procedure was tolerated well and there were no complications.  The patient was stable at the end of the procedure.  Specimens sent for flow cytometry, cytogenetics and additional studies. #4 recent asthma/infection She will continue her antibiotics.  I will present her case at the next hematology conference. I will see her back next week to discuss test results. I spent 40 minutes counseling the patient face to face. The total time spent in the appointment was 60 minutes and more than 50% was on counseling.     [facility], [place], MD 04/20/2013 12:51 PM

## 2013-04-20 NOTE — Progress Notes (Signed)
This encounter was created in error - please disregard.

## 2013-04-20 NOTE — Patient Instructions (Signed)
[facility] Discharge Instructions for Post Bone Marrow Procedure  Today you had a bone marrow biopsy and aspirate of Right Hip  Please keep the pressure dressing in place for at least 24 hours.  Have someone check your dressing periodically for bleeding.  If needed you can reapply a pressure dressing to the site.  You received percocet 10 mg after your procedure . Have someone drive you home.  IF BLEEDING REOCCURS THAT SHOULD BE REPORTED IMMEDIATELY. Call the Cancer Center at [phone] if during business hours. Or report to the Emergency Room.   I have been informed and understand all the instructions given to me. I know to contact the clinic, my physician, or go to the Emergency Department if any problems should occur. I do not have any questions at this time, but understand that I may call the clinic during office hours at [phone]  should I have any questions or need assistance in obtaining follow up care.    __________________________________________  _____________  __________ Signature of Patient or Authorized Representative            Date                   Time    __________________________________________ Nurse's Signature     

## 2013-04-25 ENCOUNTER — Encounter: Payer: Self-pay | Admitting: Hematology and Oncology

## 2013-04-25 ENCOUNTER — Ambulatory Visit (HOSPITAL_BASED_OUTPATIENT_CLINIC_OR_DEPARTMENT_OTHER): Payer: No Typology Code available for payment source | Admitting: Hematology and Oncology

## 2013-04-25 ENCOUNTER — Telehealth: Payer: Self-pay | Admitting: Hematology and Oncology

## 2013-04-25 VITALS — BP 104/69 | HR 85 | Temp 97.3°F | Resp 18 | Ht 66.0 in | Wt 188.4 lb

## 2013-04-25 DIAGNOSIS — D72819 Decreased white blood cell count, unspecified: Secondary | ICD-10-CM

## 2013-04-25 DIAGNOSIS — N949 Unspecified condition associated with female genital organs and menstrual cycle: Secondary | ICD-10-CM

## 2013-04-25 DIAGNOSIS — D649 Anemia, unspecified: Secondary | ICD-10-CM

## 2013-04-25 DIAGNOSIS — A539 Syphilis, unspecified: Secondary | ICD-10-CM

## 2013-04-25 HISTORY — DX: Syphilis, unspecified: A53.9

## 2013-04-25 LAB — BONE MARROW EXAM

## 2013-04-25 MED ORDER — DOXYCYCLINE HYCLATE 100 MG PO TABS: 100.0000 mg | ORAL_TABLET | Freq: Two times a day (BID) | ORAL | Status: DC

## 2013-04-25 NOTE — Telephone Encounter (Signed)
, °

## 2013-04-25 NOTE — Progress Notes (Signed)
[facility] OFFICE PROGRESS NOTE  [name], OLUGBEMIGA, MD DIAGNOSIS:  Severe leukopenia, anemia, abnormal lymphadenopathy, suspect syphilis  SUMMARY OF HEMATOLOGIC HISTORY: [name] who is being referred here because of background of severe pancytopenia, lymphadenopathy and abnormal biopsy. Further evaluation suspect the patient may have syphilis INTERVAL HISTORY: Morgan Bean 48 y.o. female returns for further followup. Overall she felt better. She say that the antibiotics that was prescribed to her recently was very expensive. She still had persistent fatigue. On questioning, she has noticed some genital lesion in her buttock area for the last few months, improved recently while she is on antibiotics. I have reviewed the past medical history, past surgical history, social history and family history with the patient and they are unchanged from previous note.  ALLERGIES:  is allergic to ciprofloxacin.  MEDICATIONS:  Current Outpatient Prescriptions  Medication Sig Dispense Refill  . albuterol (PROVENTIL HFA;VENTOLIN HFA) 108 (90 BASE) MCG/ACT inhaler Inhale 2 puffs into the lungs every 4 (four) hours as needed for wheezing or shortness of breath.      [name] amLODipine (NORVASC) 10 MG tablet TAKE ONE TABLET DAILY  30 tablet  0  . amoxicillin-clavulanate (AUGMENTIN) 875-125 MG per tablet Take 1 tablet by mouth 2 (two) times daily.  14 tablet  0  . aspirin 81 MG tablet Take 81 mg by mouth daily.      [name] buPROPion (WELLBUTRIN) 100 MG tablet Take 100 mg by mouth 2 (two) times daily.      . citalopram (CELEXA) 40 MG tablet Take 40 mg by mouth daily.      . cloNIDine (CATAPRES) 0.3 MG tablet Take 0.3 mg by mouth 2 (two) times daily.      . ferrous sulfate 325 (65 FE) MG tablet Take 1 tablet (325 mg total) by mouth 2 (two) times daily with a meal.  60 tablet  3  . furosemide (LASIX) 20 MG tablet Take 1 tablet (20 mg total) by mouth 2 (two) times daily.  60 tablet   11  . hydrOXYzine (ATARAX/VISTARIL) 25 MG tablet Take 25 mg by mouth at bedtime as needed (sleep).      . loratadine (CLARITIN) 10 MG tablet Take 1 tablet (10 mg total) by mouth daily.  30 tablet  3  . Multiple Vitamin (MULTIVITAMIN WITH MINERALS) TABS Take 1 tablet by mouth daily.      . polyethylene glycol powder (GLYCOLAX/MIRALAX) powder Take 17 g by mouth daily as needed (constipation).       . potassium chloride (K-DUR,KLOR-CON) 10 MEQ tablet Take 1 tablet (10 mEq total) by mouth 2 (two) times daily with a meal.  60 tablet  11  . predniSONE (DELTASONE) 20 MG tablet Take 2 tablets (40 mg total) by mouth daily.  5 tablet  0  . traZODone (DESYREL) 100 MG tablet Take 100 mg by mouth at bedtime.      [name] doxycycline (VIBRA-TABS) 100 MG tablet Take 1 tablet (100 mg total) by mouth 2 (two) times daily.  60 tablet  0   No current facility-administered medications for this visit.     REVIEW OF SYSTEMS:   Constitutional: Denies fevers, chills or night sweats Eyes: Denies blurriness of vision Ears, nose, mouth, throat, and face: Denies mucositis or sore throat Respiratory: Denies cough, dyspnea or wheezes Cardiovascular: Denies palpitation, chest discomfort or lower extremity swelling Gastrointestinal:  Denies nausea, heartburn or change in bowel habits Skin: Denies abnormal skin rashes Lymphatics: Denies new lymphadenopathy  or easy bruising Neurological:Denies numbness, tingling or new weaknesses Behavioral/Psych: Mood is stable, no new changes  All other systems were reviewed with the patient and are negative.  PHYSICAL EXAMINATION: ECOG PERFORMANCE STATUS: 1 - Symptomatic but completely ambulatory  Filed Vitals:   04/25/13 1243  BP: 104/69  Pulse: 85  Temp: 97.3 F (36.3 C)  Resp: 18   Filed Weights   04/25/13 1243  Weight: 188 lb 6.4 oz (85.458 kg)    GENERAL:alert, no distress and comfortable SKIN: skin color, texture, turgor are normal, no rashes or significant  lesions EYES: normal, Conjunctiva are pink and non-injected, sclera clear OROPHARYNX:no exudate, no erythema and lips, buccal mucosa, and tongue normal  NECK: supple, thyroid normal size, non-tender, without nodularity LYMPH:  no palpable lymphadenopathy in the cervical, axillary or inguinal LUNGS: clear to auscultation and percussion with normal breathing effort HEART: regular rate & rhythm and no murmurs and no lower extremity edema ABDOMEN:abdomen soft, non-tender and normal bowel sounds Musculoskeletal:no cyanosis of digits and no clubbing  NEURO: alert & oriented x 3 with fluent speech, no focal motor/sensory deficits Examination of her buttock cheek revealed chancre suspicious of syphilis LABORATORY DATA:  I have reviewed the data as listed No results found for this or any previous visit (from the past 48 hour(s)).  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 We review her scans and biopsy report. Overall, her over presentation could be suspicious for syphilis. Her sore is improving while on antibiotic therapy. I recommend infectious disease consultation. I placed her on doxycycline twice a day for month. I will see her back next month for further evaluation.  All questions were answered. The patient knows to call the clinic with any problems, questions or concerns. No barriers to learning was detected.  I spent 25 minutes counseling the patient face to face. The total time spent in the appointment was 40 minutes and more than 50% was on counseling.     [place], [place], MD 04/25/2013 1:40 PM

## 2013-04-25 NOTE — Telephone Encounter (Signed)
m °

## 2013-04-27 ENCOUNTER — Other Ambulatory Visit: Payer: Self-pay | Admitting: Cardiology

## 2013-04-28 LAB — TISSUE HYBRIDIZATION (BONE MARROW)-NCBH

## 2013-04-28 LAB — CHROMOSOME ANALYSIS, BONE MARROW

## 2013-05-02 ENCOUNTER — Ambulatory Visit (INDEPENDENT_AMBULATORY_CARE_PROVIDER_SITE_OTHER): Payer: No Typology Code available for payment source | Admitting: Physician Assistant

## 2013-05-02 ENCOUNTER — Encounter: Payer: Self-pay | Admitting: Physician Assistant

## 2013-05-02 VITALS — BP 120/90 | HR 94 | Ht 66.0 in | Wt 183.0 lb

## 2013-05-02 DIAGNOSIS — I2510 Atherosclerotic heart disease of native coronary artery without angina pectoris: Secondary | ICD-10-CM

## 2013-05-02 DIAGNOSIS — I079 Rheumatic tricuspid valve disease, unspecified: Secondary | ICD-10-CM

## 2013-05-02 DIAGNOSIS — I10 Essential (primary) hypertension: Secondary | ICD-10-CM

## 2013-05-02 DIAGNOSIS — I5032 Chronic diastolic (congestive) heart failure: Secondary | ICD-10-CM

## 2013-05-02 DIAGNOSIS — R0602 Shortness of breath: Secondary | ICD-10-CM

## 2013-05-02 DIAGNOSIS — E785 Hyperlipidemia, unspecified: Secondary | ICD-10-CM

## 2013-05-02 LAB — BASIC METABOLIC PANEL
BUN: 9 mg/dL (ref 6–23)
CALCIUM: 9.4 mg/dL (ref 8.4–10.5)
CO2: 22 mEq/L (ref 19–32)
Chloride: 105 mEq/L (ref 96–112)
Creatinine, Ser: 0.9 mg/dL (ref 0.4–1.2)
GFR: 82.11 mL/min (ref >60.00)
GLUCOSE: 87 mg/dL (ref 70–99)
Potassium: 3.7 mEq/L (ref 3.5–5.1)
Sodium: 136 mEq/L (ref 135–145)

## 2013-05-02 LAB — BRAIN NATRIURETIC PEPTIDE: Pro B Natriuretic peptide (BNP): 132 pg/mL — ABNORMAL HIGH (ref 0.0–100.0)

## 2013-05-02 NOTE — Progress Notes (Signed)
[name]. [address], [address] [place], [place]  [address] Phone: [phone] Fax:  [phone]  Date:  05/02/2013   ID:  Morgan Bean, DOB 05-07-1965, MRN [id number]  PCP:  [name], MD  Cardiologist:  Dr. Loralie Champagne     History of Present Illness: Morgan Bean is a 48 y.o. female with a hx of diastolic CHF, tricuspid regurgitation, abnormal echo with mod pulmonary HTN in 11/2009 and nonobstructive CAD.   She has continued to have problems with exertional dyspnea.  When last seen by Dr. Loralie Champagne in 07/2012, she reported being short of breath after walking 1/2-1 block or walking up a flight of steps.  Last echo showed normal EF, moderate LVH, and only mild TR (was severe in the past). BP had been under control. She still had occasional atypical chest pain but nothing exertional. She continued to smoke and is thought to have COPD. She saw Dr. [name] and PFTs did not show much airflow obstruction. She had a lot of upper airway symptoms including cough she was switched to an ARB.  I saw her last in 09/2012.    She was admitted with chest pain in 10/2012.  She ruled out for MI and dx with MSK chest pain.  Over the last few months, she has been followed by oncology for cervical lymphadenopathy and pancytopenia.  There was some concern for Lymphoma.  Recent notes indicate positive syphilis testing (RPR). Her case has been reviewed with the hematology tumor board and she has been placed on antibiotics.  She continues to note dyspnea. She probably describes class III symptoms. She notes wheezing but this is improved. She denies any significant weight gain. She sleeps on 2-3 pillows. She has awoken short of breath at times. She is not certain that she is experiencing increased abdominal girth. She denies chest pain. She did have syncope/near syncope when she was taken to the emergency room for acute respiratory failure 2 weeks ago. She was put on antibiotics for possible pneumonia (subtle  infiltrate on CT).  She denies any further syncope but has experienced lightheadedness at times.  Recent Labs: 04/16/2013: Pro B Natriuretic peptide (BNP) 415.6*  04/20/2013: ALT 21; AST 13; BUN 15.3; Creatinine 0.9; Hemoglobin 9.1*; Potassium 3.5; Sodium 137   Wt Readings from Last 3 Encounters:  05/02/13 183 lb (83.008 kg)  04/25/13 188 lb 6.4 oz (85.458 kg)  04/20/13 186 lb 9.6 oz (84.641 kg)     Past Medical History  Diagnosis Date  . Coronary artery disease     Lexiscan myoview (10/12) with significant ST depression upon Lexiscan injection but no ischemia or infarction on perfusion images. Left heart cath (10/12): 30% mid LAD, 30% ostial D1, 50% ostial D2.    [name] Hypertension   . Tricuspid regurgitation   . Iron deficiency     hx of  . Polysubstance abuse     Prior cocaine  . Tobacco abuse   . Leukocytoclastic vasculitis     Rash across lower body, occurred in 9/11, diagnosed by skin biopsy. ANA positive. Thought to be secondary to cocaine use.  . Pulmonary HTN     Echo (9/11) with EF 65%, mild LVH, mild AI, mild MR, moderate to possibly severe TR with PA systolic pressure 52 mmHg. Echo (10/12): severe LV hypertrophy, EF 60-65%, mild MR, mild AI, moderate to severe tricuspid regurgitation, PA systolic pressure 38 mmHg.  Echo 4/14: moderate LVH, EF 65%, normal wall motion, diastolic dysfunction, mild AI, mild MR  .  Asthma   . Shortness of breath     a. PFTs 5/14:  FEV1/FVC 99% predicted; FVC 60% predicted; DLCO mildly reduced, mild restriction, air trapping.;  b.  seen by pulmo (Dr. Shelle Iron) 5/14: mainly upper airway symptoms - ACE d/c'd (sx's better)  . Chronic diastolic CHF (congestive heart failure)   . Constipation   . Lymphadenopathy 03/21/2013  . Leukopenia 03/21/2013  . Unspecified deficiency anemia 03/29/2013  . Anginal pain     none in past year  . Anxiety   . Mental disorder   . Depression   . Stroke 2010ish  . Arthritis   . Pancytopenia   . Hyperlipemia   .  Syphilis 04/25/2013      Current Outpatient Prescriptions  Medication Sig Dispense Refill  . albuterol (PROVENTIL HFA;VENTOLIN HFA) 108 (90 BASE) MCG/ACT inhaler Inhale 2 puffs into the lungs every 4 (four) hours as needed for wheezing or shortness of breath.      [name] amLODipine (NORVASC) 10 MG tablet TAKE ONE TABLET DAILY  30 tablet  0  . aspirin 81 MG tablet Take 81 mg by mouth daily.      [name] buPROPion (WELLBUTRIN) 100 MG tablet Take 100 mg by mouth 2 (two) times daily.      . citalopram (CELEXA) 40 MG tablet Take 40 mg by mouth daily.      . cloNIDine (CATAPRES) 0.3 MG tablet Take 0.3 mg by mouth 2 (two) times daily.      [name] doxycycline (VIBRA-TABS) 100 MG tablet Take 1 tablet (100 mg total) by mouth 2 (two) times daily.  60 tablet  0  . furosemide (LASIX) 20 MG tablet Take 1 tablet (20 mg total) by mouth 2 (two) times daily.  60 tablet  11  . hydrOXYzine (ATARAX/VISTARIL) 25 MG tablet Take 25 mg by mouth at bedtime as needed (sleep).      . loratadine (CLARITIN) 10 MG tablet Take 1 tablet (10 mg total) by mouth daily.  30 tablet  3  . Multiple Vitamin (MULTIVITAMIN WITH MINERALS) TABS Take 1 tablet by mouth daily.      . polyethylene glycol powder (GLYCOLAX/MIRALAX) powder Take 17 g by mouth daily as needed (constipation).       . potassium chloride (K-DUR,KLOR-CON) 10 MEQ tablet Take 1 tablet (10 mEq total) by mouth 2 (two) times daily with a meal.  60 tablet  11  . traZODone (DESYREL) 100 MG tablet Take 100 mg by mouth at bedtime.       No current facility-administered medications for this visit.    Allergies:    Allergies  Allergen Reactions  . Ciprofloxacin Itching    Social History:  The patient  reports that she has been smoking Cigarettes.  She has a 7.5 pack-year smoking history. She has never used smokeless tobacco. She reports that she uses illicit drugs ("Crack" cocaine) about once per week. She reports that she does not drink alcohol.   ROS:  Please see the history of  present illness.  She does note nausea. She has a poor appetite. She denies melena, hematochezia.   All other systems reviewed and negative.   PHYSICAL EXAM: VS:  BP 120/90  Pulse 94  Ht 5\' 6"  (1.676 m)  Wt 183 lb (83.008 kg)  BMI 29.55 kg/m2  LMP 04/05/2013 Well nourished, well developed, in no acute distress HEENT: normal Neck: no JVD Cardiac:  normal S1, S2; RRR; no murmur Lungs:  Decreased breath sounds bilaterally, no wheezing, rhonchi or rales  Abd: soft, nontender, no hepatomegaly Ext: no edema Skin: warm and dry Neuro:  CNs 2-12 intact, no focal abnormalities noted  EKG:  NSR, HR 94, normal axis, PACs   ASSESSMENT AND PLAN:  1. Dyspnea:  I suspect that this is multifactorial. Her dyspnea is likely worse in the setting of recent respiratory infection. She also has had significant anemia over the last several months. This is likely impacting her breathing. She does not appear volume overloaded on exam. She is on a stable dose of Lasix. I will obtain a basic metabolic panel and BNP today. If her BNP is significantly elevated, consider increasing Lasix.  She remains on prn Albuterol.  PFTs were fairly normal in the past.  If dyspnea and wheezing continue, consider referral back to pulmonary.   2. Anemia:  Continue follow up with oncology.  She is pending referral to ID as well for ? Syphilis.  I suspect anemia is impacting her breathing.  3. Hypertension: Controlled.  4. Diastolic CHF:  Obtain basic metabolic panel and BNP as noted above.  5. Tricuspid Regurgitation:  Mild by most recent echo.  At this point in time, she does not require follow up echocardiogram.  6. Tobacco Abuse:  She is still trying to quit.  7. CAD:  No symptoms of angina. Continue ASA.  She is off of statin for unclear reasons.  Patient thinks she was switched to Lipitor but needs a refill.  Will review her chart.   8. Disposition:  F/u with Dr. Loralie Champagne in 3 mos.   Signed, Richardson Dopp, PA-C  05/02/2013  9:43 AM

## 2013-05-02 NOTE — Patient Instructions (Signed)
LAB WORK TODAY; BMET, BNP  Your physician recommends that you continue on your current medications as directed. Please refer to the Current Medication list given to you today.  Your physician recommends that you schedule a follow-up appointment in: [place] DR. Aundra Dubin

## 2013-05-03 ENCOUNTER — Encounter: Payer: Self-pay | Admitting: Internal Medicine

## 2013-05-03 ENCOUNTER — Telehealth: Payer: Self-pay | Admitting: *Deleted

## 2013-05-03 ENCOUNTER — Ambulatory Visit: Payer: No Typology Code available for payment source | Attending: Internal Medicine | Admitting: Internal Medicine

## 2013-05-03 VITALS — BP 137/101 | HR 103 | Temp 99.9°F | Resp 14 | Ht 66.0 in | Wt 185.6 lb

## 2013-05-03 DIAGNOSIS — R0989 Other specified symptoms and signs involving the circulatory and respiratory systems: Secondary | ICD-10-CM | POA: Insufficient documentation

## 2013-05-03 DIAGNOSIS — R05 Cough: Secondary | ICD-10-CM | POA: Insufficient documentation

## 2013-05-03 DIAGNOSIS — Z0000 Encounter for general adult medical examination without abnormal findings: Secondary | ICD-10-CM

## 2013-05-03 DIAGNOSIS — R112 Nausea with vomiting, unspecified: Secondary | ICD-10-CM | POA: Insufficient documentation

## 2013-05-03 DIAGNOSIS — F17200 Nicotine dependence, unspecified, uncomplicated: Secondary | ICD-10-CM | POA: Insufficient documentation

## 2013-05-03 DIAGNOSIS — Z23 Encounter for immunization: Secondary | ICD-10-CM

## 2013-05-03 DIAGNOSIS — I2789 Other specified pulmonary heart diseases: Secondary | ICD-10-CM | POA: Insufficient documentation

## 2013-05-03 DIAGNOSIS — J040 Acute laryngitis: Secondary | ICD-10-CM | POA: Insufficient documentation

## 2013-05-03 DIAGNOSIS — R509 Fever, unspecified: Secondary | ICD-10-CM | POA: Insufficient documentation

## 2013-05-03 DIAGNOSIS — R059 Cough, unspecified: Secondary | ICD-10-CM | POA: Insufficient documentation

## 2013-05-03 DIAGNOSIS — R0609 Other forms of dyspnea: Secondary | ICD-10-CM | POA: Insufficient documentation

## 2013-05-03 DIAGNOSIS — I10 Essential (primary) hypertension: Secondary | ICD-10-CM | POA: Insufficient documentation

## 2013-05-03 LAB — COMPLETE METABOLIC PANEL WITH GFR
ALT: <8 U/L (ref 0–35)
AST: 10 U/L (ref 0–37)
Albumin: 3.6 g/dL (ref 3.5–5.2)
Alkaline Phosphatase: 78 U/L (ref 39–117)
BUN: 10 mg/dL (ref 6–23)
CO2: 23 meq/L (ref 19–32)
Calcium: 9.5 mg/dL (ref 8.4–10.5)
Chloride: 105 mEq/L (ref 96–112)
Creat: 1 mg/dL (ref 0.50–1.10)
GFR, EST AFRICAN AMERICAN: 76 mL/min
GFR, EST NON AFRICAN AMERICAN: 66 mL/min
GLUCOSE: 98 mg/dL (ref 70–99)
POTASSIUM: 4.2 meq/L (ref 3.5–5.3)
Sodium: 133 mEq/L — ABNORMAL LOW (ref 135–145)
Total Bilirubin: 0.4 mg/dL (ref 0.2–1.2)
Total Protein: 9 g/dL — ABNORMAL HIGH (ref 6.0–8.3)

## 2013-05-03 MED ORDER — AZITHROMYCIN 500 MG PO TABS: 500.0000 mg | ORAL_TABLET | Freq: Every day | ORAL | Status: DC

## 2013-05-03 NOTE — Telephone Encounter (Signed)
pt notified about lab results with verbal understanding  

## 2013-05-03 NOTE — Progress Notes (Signed)
Patient is here for an office visit. Still has lost her voice. Complains of a fever of 99-100. Temp today of 99.9. Nausea x2 days and vomiting x1 days. Light headaches and taking ibuprofen x4 days; fatigue. Complains of possible flu.

## 2013-05-03 NOTE — Progress Notes (Signed)
Patient ID: Morgan Bean, female   DOB: [birth year], 48 y.o.   MRN: [id number]   CC:  HPI: 48 year old female with a hx of diastolic CHF, tricuspid regurgitation, abnormal echo with mod pulmonary HTN in 11/2009 and nonobstructive CAD. She has continued to have problems with exertional dyspnea. When last seen by Dr. Loralie Champagne in 07/2012, she reported being short of breath after walking 1/2-1 block or walking up a flight of steps. Last echo showed normal EF, moderate LVH, and only mild TR (was severe in the past). BP had been under control.  She also history of neutropenia and is status post bone marrow biopsy by hematology. She continues to smoke.She saw Dr. [name] and PFTs did not show much airflow obstruction. She had a lot of upper airway symptoms including cough she was switched to an ARB She also status post cervical lymph node biopsy. Today she is here for evaluation of hoarseness according to the patient she had hoarseness even before the biopsy. Now the hoarseness is accompanied by low-grade fever and cough.     Allergies  Allergen Reactions  . Ciprofloxacin Itching   Past Medical History  Diagnosis Date  . Coronary artery disease     Lexiscan myoview (10/12) with significant ST depression upon Lexiscan injection but no ischemia or infarction on perfusion images. Left heart cath (10/12): 30% mid LAD, 30% ostial D1, 50% ostial D2.    [name] Hypertension   . Tricuspid regurgitation   . Iron deficiency     hx of  . Polysubstance abuse     Prior cocaine  . Tobacco abuse   . Leukocytoclastic vasculitis     Rash across lower body, occurred in 9/11, diagnosed by skin biopsy. ANA positive. Thought to be secondary to cocaine use.  . Pulmonary HTN     Echo (9/11) with EF 65%, mild LVH, mild AI, mild MR, moderate to possibly severe TR with PA systolic pressure 52 mmHg. Echo (10/12): severe LV hypertrophy, EF 60-65%, mild MR, mild AI, moderate to severe tricuspid regurgitation, PA systolic  pressure 38 mmHg.  Echo 4/14: moderate LVH, EF 65%, normal wall motion, diastolic dysfunction, mild AI, mild MR  . Asthma   . Shortness of breath     a. PFTs 5/14:  FEV1/FVC 99% predicted; FVC 60% predicted; DLCO mildly reduced, mild restriction, air trapping.;  b.  seen by pulmo (Dr. [name]) 5/14: mainly upper airway symptoms - ACE d/c'd (sx's better)  . Chronic diastolic CHF (congestive heart failure)   . Constipation   . Lymphadenopathy 03/21/2013  . Leukopenia 03/21/2013  . Unspecified deficiency anemia 03/29/2013  . Anginal pain     none in past year  . Anxiety   . Mental disorder   . Depression   . Stroke 2010ish  . Arthritis   . Pancytopenia   . Hyperlipemia   . Syphilis 04/25/2013   Current Outpatient Prescriptions on File Prior to Visit  Medication Sig Dispense Refill  . albuterol (PROVENTIL HFA;VENTOLIN HFA) 108 (90 BASE) MCG/ACT inhaler Inhale 2 puffs into the lungs every 4 (four) hours as needed for wheezing or shortness of breath.      [name] amLODipine (NORVASC) 10 MG tablet TAKE ONE TABLET DAILY  30 tablet  0  . aspirin 81 MG tablet Take 81 mg by mouth daily.      [name] buPROPion (WELLBUTRIN) 100 MG tablet Take 100 mg by mouth 2 (two) times daily.      . citalopram (CELEXA) 40 MG tablet Take  40 mg by mouth daily.      . cloNIDine (CATAPRES) 0.3 MG tablet Take 0.3 mg by mouth 2 (two) times daily.      . furosemide (LASIX) 20 MG tablet Take 1 tablet (20 mg total) by mouth 2 (two) times daily.  60 tablet  11  . hydrOXYzine (ATARAX/VISTARIL) 25 MG tablet Take 25 mg by mouth at bedtime as needed (sleep).      . loratadine (CLARITIN) 10 MG tablet Take 1 tablet (10 mg total) by mouth daily.  30 tablet  3  . Multiple Vitamin (MULTIVITAMIN WITH MINERALS) TABS Take 1 tablet by mouth daily.      . polyethylene glycol powder (GLYCOLAX/MIRALAX) powder Take 17 g by mouth daily as needed (constipation).       . potassium chloride (K-DUR,KLOR-CON) 10 MEQ tablet Take 1 tablet (10 mEq total) by  mouth 2 (two) times daily with a meal.  60 tablet  11  . traZODone (DESYREL) 100 MG tablet Take 100 mg by mouth at bedtime.       No current facility-administered medications on file prior to visit.   Family History  Problem Relation Age of Onset  . Coronary artery disease Mother   . Diabetes Mother   . Breast cancer Maternal Grandmother   . Diabetes Maternal Grandmother   . Diabetes Father   . Hypertension Father   . Coronary artery disease Father    History   Social History  . Marital Status: Single    Spouse Name: N/A    Number of Children: 2  . Years of Education: N/A   Occupational History  . unemployed    Social History Main Topics  . Smoking status: Current Every Day Smoker -- 0.25 packs/day for 30 years    Types: Cigarettes  . Smokeless tobacco: Never Used     Comment: 5 cigs --- uses E-cig  . Alcohol Use: No     Comment: pint of wine 2-3 times a week--ocassional  . Drug Use: 1.00 per week    Special: "Crack" cocaine     Comment: trying to quit---- quit November 2013   . Sexual Activity: Yes   Other Topics Concern  . Not on file   Social History Narrative   Lives with mom, sisters and brothers.    Review of Systems  Constitutional: As in history of present illness  HENT: Negative for ear pain, nosebleeds, congestion, facial swelling, rhinorrhea, neck pain, neck stiffness and ear discharge.   Eyes: Negative for pain, discharge, redness, itching and visual disturbance.  Respiratory: Negative for cough, choking, chest tightness, shortness of breath, wheezing and stridor.   Cardiovascular: Negative for chest pain, palpitations and leg swelling.  Gastrointestinal: Negative for abdominal distention.  Genitourinary: Negative for dysuria, urgency, frequency, hematuria, flank pain, decreased urine volume, difficulty urinating and dyspareunia.  Musculoskeletal: Negative for back pain, joint swelling, arthralgias and gait problem.  Neurological: Negative for  dizziness, tremors, seizures, syncope, facial asymmetry, speech difficulty, weakness, light-headedness, numbness and headaches.  Hematological: Negative for adenopathy. Does not bruise/bleed easily.  Psychiatric/Behavioral: Negative for hallucinations, behavioral problems, confusion, dysphoric mood, decreased concentration and agitation.    Objective:   Filed Vitals:   05/03/13 1726  BP: 137/101  Pulse: 103  Temp: 99.9 F (37.7 C)  Resp: 14    Physical Exam  Constitutional: Appears well-developed and well-nourished. No distress.  HENT: Normocephalic. External right and left ear normal. Oropharynx is clear and moist.  Eyes: Conjunctivae and EOM are normal.  PERRLA, no scleral icterus.  Neck: Normal ROM. Neck supple. No JVD. No tracheal deviation. No thyromegaly.  CVS: RRR, S1/S2 +, no murmurs, no gallops, no carotid bruit.  Pulmonary: Effort and breath sounds normal, no stridor, rhonchi, wheezes, rales.  Abdominal: Soft. BS +,  no distension, tenderness, rebound or guarding.  Musculoskeletal: Normal range of motion. No edema and no tenderness.  Lymphadenopathy: No lymphadenopathy noted, cervical, inguinal. Neuro: Alert. Normal reflexes, muscle tone coordination. No cranial nerve deficit. Skin: Skin is warm and dry. No rash noted. Not diaphoretic. No erythema. No pallor.  Psychiatric: Normal mood and affect. Behavior, judgment, thought content normal.   Lab Results  Component Value Date   WBC 2.7* 04/20/2013   HGB 9.1* 04/20/2013   HCT 27.8* 04/20/2013   MCV 77.7* 04/20/2013   PLT 346 04/20/2013   Lab Results  Component Value Date   CREATININE 0.9 05/02/2013   BUN 9 05/02/2013   NA 136 05/02/2013   K 3.7 05/02/2013   CL 105 05/02/2013   CO2 22 05/02/2013    Lab Results  Component Value Date   HGBA1C 5.5 04/06/2012   Lipid Panel     Component Value Date/Time   CHOL 162 04/07/2012 0221   TRIG 100 04/07/2012 0221   HDL 45 04/07/2012 0221   CHOLHDL 3.6 04/07/2012 0221   VLDL 20  04/07/2012 0221   LDLCALC 97 04/07/2012 0221       Assessment and plan:   Patient Active Problem List   Diagnosis Date Noted  . Syphilis 04/25/2013  . Unspecified deficiency anemia 03/29/2013  . Lymphadenopathy 03/21/2013  . Leukopenia 03/21/2013  . Fibroid uterus 09/14/2012  . Perimenopause 09/14/2012  . Chronic cough 08/12/2012  . DOE (dyspnea on exertion) 08/12/2012  . CVA (cerebral infarction) 08/09/2012  . Chest pain, mid sternal 04/07/2012  . Pulmonary hypertension 04/06/2012  . Polysubstance abuse 04/06/2012  . Acute bronchitis 04/06/2012  . Cocaine abuse 11/16/2011  . Major depressive disorder, recurrent episode with psychotic features. 11/15/2011    Class: Acute  . Abscess and cellulitis 08/09/2011  . Chronic diastolic heart failure 99/83/3825  . CAD (coronary artery disease) 02/24/2011  . Hyperlipidemia 01/06/2011  . Hypertension 12/15/2010  . Chest pain 12/15/2010  . Exertional dyspnea 12/15/2010  . Smoking 12/15/2010   Acute laryngitis We'll prescribe azithromycin for 7 days As needed albuterol Continue Claritin Refer back to ENT for persistent symptoms   Chest x-ray, UA for fever, CBC for neutropenia     The patient was given clear instructions to go to ER or return to medical center if symptoms don't improve, worsen or new problems develop. The patient verbalized understanding. The patient was told to call to get any lab results if not heard anything in the next week.

## 2013-05-03 NOTE — Addendum Note (Signed)
Addended by: Bowe Sidor, [place] R on: 05/03/2013 05:58 PM   Modules accepted: Orders

## 2013-05-04 ENCOUNTER — Ambulatory Visit: Payer: No Typology Code available for payment source | Attending: Internal Medicine

## 2013-05-04 ENCOUNTER — Ambulatory Visit (HOSPITAL_COMMUNITY)
Admission: RE | Admit: 2013-05-04 | Discharge: 2013-05-04 | Disposition: A | Discharge status: Home / Self Care | Payer: No Typology Code available for payment source | Source: Ambulatory Visit | Attending: Internal Medicine | Admitting: Internal Medicine

## 2013-05-04 ENCOUNTER — Telehealth: Payer: Self-pay | Admitting: *Deleted

## 2013-05-04 DIAGNOSIS — R509 Fever, unspecified: Secondary | ICD-10-CM

## 2013-05-04 DIAGNOSIS — R0602 Shortness of breath: Secondary | ICD-10-CM | POA: Insufficient documentation

## 2013-05-04 LAB — CBC WITH DIFFERENTIAL/PLATELET
Basophils Relative: 0 % (ref 0–1)
EOS PCT: 0 % (ref 0–5)
HCT: 28.4 % — ABNORMAL LOW (ref 36.0–46.0)
Hemoglobin: 9.1 g/dL — ABNORMAL LOW (ref 12.0–15.0)
Lymphocytes Relative: 99 % — ABNORMAL HIGH (ref 12–46)
MCH: 24.1 pg — AB (ref 26.0–34.0)
MCHC: 32 g/dL (ref 30.0–36.0)
MCV: 75.3 fL — ABNORMAL LOW (ref 78.0–100.0)
Monocytes Relative: 0 % — ABNORMAL LOW (ref 3–12)
Neutrophils Relative %: 1 % — ABNORMAL LOW (ref 43–77)
PLATELETS: 286 10*3/uL (ref 150–400)
RBC: 3.77 MIL/uL — ABNORMAL LOW (ref 3.87–5.11)
RDW: 19.7 % — AB (ref 11.5–15.5)
WBC: 2.3 10*3/uL — ABNORMAL LOW (ref 4.0–10.5)

## 2013-05-04 LAB — POCT URINALYSIS DIPSTICK
Glucose, UA: NEGATIVE
LEUKOCYTES UA: NEGATIVE
Nitrite, UA: NEGATIVE
PH UA: 5.5
PROTEIN UA: 100
Spec Grav, UA: >=1.03
UROBILINOGEN UA: 1

## 2013-05-04 NOTE — Progress Notes (Unsigned)
Patient came back into the clinic to complete a urine dipstick that was ordered yesterday during her visit with [place].

## 2013-05-04 NOTE — Telephone Encounter (Signed)
Contacted patient to notify her that she needs to come in to give a urine sample. Patient agreed and will come in this afternoon.

## 2013-05-08 ENCOUNTER — Telehealth: Payer: Self-pay | Admitting: *Deleted

## 2013-05-08 DIAGNOSIS — E785 Hyperlipidemia, unspecified: Secondary | ICD-10-CM

## 2013-05-08 DIAGNOSIS — I2510 Atherosclerotic heart disease of native coronary artery without angina pectoris: Secondary | ICD-10-CM

## 2013-05-08 MED ORDER — ATORVASTATIN CALCIUM 40 MG PO TABS: 40.0000 mg | ORAL_TABLET | Freq: Every day | ORAL | Status: DC

## 2013-05-08 NOTE — Telephone Encounter (Signed)
Notified patient of results as followed. 

## 2013-05-08 NOTE — Telephone Encounter (Signed)
pt notifed to re-start lipitor 40 mg qhs per Brynda Rim. PA, Rx sent in. FLP/LFT 06/20/13. Pt verbalized understanding to Plan of Care

## 2013-05-08 NOTE — Telephone Encounter (Signed)
Message copied by Sherion Dooly, [place] R on Mon May 08, 2013  9:21 AM ------      Message from: Allyson Sabal MD, Ascencion Dike      Created: Fri May 05, 2013 10:21 AM       Notify patient that the chest x-ray was negative. The labs including white count and hemoglobin are unchanged. ------

## 2013-05-23 ENCOUNTER — Ambulatory Visit (HOSPITAL_BASED_OUTPATIENT_CLINIC_OR_DEPARTMENT_OTHER): Payer: No Typology Code available for payment source | Admitting: Hematology and Oncology

## 2013-05-23 ENCOUNTER — Telehealth: Payer: Self-pay | Admitting: Hematology and Oncology

## 2013-05-23 ENCOUNTER — Other Ambulatory Visit (HOSPITAL_BASED_OUTPATIENT_CLINIC_OR_DEPARTMENT_OTHER): Payer: No Typology Code available for payment source

## 2013-05-23 VITALS — BP 98/67 | HR 77 | Temp 98.4°F | Resp 18 | Ht 66.0 in | Wt 175.1 lb

## 2013-05-23 DIAGNOSIS — R634 Abnormal weight loss: Secondary | ICD-10-CM

## 2013-05-23 DIAGNOSIS — A539 Syphilis, unspecified: Secondary | ICD-10-CM

## 2013-05-23 DIAGNOSIS — D649 Anemia, unspecified: Secondary | ICD-10-CM

## 2013-05-23 DIAGNOSIS — R509 Fever, unspecified: Secondary | ICD-10-CM

## 2013-05-23 DIAGNOSIS — D72819 Decreased white blood cell count, unspecified: Secondary | ICD-10-CM

## 2013-05-23 DIAGNOSIS — D539 Nutritional anemia, unspecified: Secondary | ICD-10-CM

## 2013-05-23 DIAGNOSIS — R609 Edema, unspecified: Secondary | ICD-10-CM

## 2013-05-23 DIAGNOSIS — R591 Generalized enlarged lymph nodes: Secondary | ICD-10-CM

## 2013-05-23 LAB — COMPREHENSIVE METABOLIC PANEL (CC13)
ALT: 7 U/L (ref 0–55)
AST: 9 U/L (ref 5–34)
Albumin: 2.7 g/dL — ABNORMAL LOW (ref 3.5–5.0)
Alkaline Phosphatase: 74 U/L (ref 40–150)
Anion Gap: 8 mEq/L (ref 3–11)
BILIRUBIN TOTAL: 0.27 mg/dL (ref 0.20–1.20)
BUN: 15.3 mg/dL (ref 7.0–26.0)
CHLORIDE: 105 meq/L (ref 98–109)
CO2: 24 meq/L (ref 22–29)
Calcium: 9.8 mg/dL (ref 8.4–10.4)
Creatinine: 1.2 mg/dL — ABNORMAL HIGH (ref 0.6–1.1)
Glucose: 107 mg/dl (ref 70–140)
Potassium: 4.3 mEq/L (ref 3.5–5.1)
Sodium: 137 mEq/L (ref 136–145)
Total Protein: 9.4 g/dL — ABNORMAL HIGH (ref 6.4–8.3)

## 2013-05-23 LAB — CBC & DIFF AND RETIC
BASO%: 0 % (ref 0.0–2.0)
Basophils Absolute: 0 10*3/uL (ref 0.0–0.1)
EOS%: 1.2 % (ref 0.0–7.0)
Eosinophils Absolute: 0 10*3/uL (ref 0.0–0.5)
HCT: 26.4 % — ABNORMAL LOW (ref 34.8–46.6)
HGB: 8.4 g/dL — ABNORMAL LOW (ref 11.6–15.9)
IMMATURE RETIC FRACT: 7.1 % (ref 1.60–10.00)
LYMPH%: 83.7 % — ABNORMAL HIGH (ref 14.0–49.7)
MCH: 23.9 pg — ABNORMAL LOW (ref 25.1–34.0)
MCHC: 31.8 g/dL (ref 31.5–36.0)
MCV: 75.1 fL — AB (ref 79.5–101.0)
MONO#: 0.2 10*3/uL (ref 0.1–0.9)
MONO%: 14.7 % — AB (ref 0.0–14.0)
NEUT#: 0 10*3/uL — CL (ref 1.5–6.5)
NEUT%: 0.4 % — ABNORMAL LOW (ref 38.4–76.8)
Platelets: 375 10*3/uL (ref 145–400)
RBC: 3.52 10*6/uL — ABNORMAL LOW (ref 3.70–5.45)
RDW: 18.8 % — AB (ref 11.2–14.5)
RETIC CT ABS: 27.6 10*3/uL — AB (ref 33.70–90.70)
Retic %: 0.77 % (ref 0.70–2.10)
WBC: 1.5 10*3/uL — ABNORMAL LOW (ref 3.9–10.3)
lymph#: 1.2 10*3/uL (ref 0.9–3.3)

## 2013-05-23 LAB — TECHNOLOGIST REVIEW

## 2013-05-23 LAB — HOLD TUBE, BLOOD BANK

## 2013-05-23 NOTE — Progress Notes (Signed)
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 Further evaluation suspect the patient may have syphilis.  INTERVAL HISTORY: Morgan Bean 48 y.o. female returns for followup. Last, I referred her to infectious disease but she has not been seen. In the meantime, she has been compliant with doxycycline prescription. The genital lesion in the buttock area is improving, almost completely healed. She is to have persistent fatigue, low-grade temperature, anorexia 10 pound weight loss. Recently, she was placed on Lipitor and she felt that could be the cause of her facial swelling. She subsequently discontinue Lipitor and a facial swelling has improved.  I have reviewed the past medical history, past surgical history, social history and family history with the patient and they are unchanged from previous note.  ALLERGIES:  is allergic to ciprofloxacin.  MEDICATIONS:  Current Outpatient Prescriptions  Medication Sig Dispense Refill  . albuterol (PROVENTIL HFA;VENTOLIN HFA) 108 (90 BASE) MCG/ACT inhaler Inhale 2 puffs into the lungs every 4 (four) hours as needed for wheezing or shortness of breath.      [name] amLODipine (NORVASC) 10 MG tablet TAKE ONE TABLET DAILY  30 tablet  0  . aspirin 81 MG tablet Take 81 mg by mouth daily.      [name] azithromycin (ZITHROMAX) 500 MG tablet Take 1 tablet (500 mg total) by mouth daily.  7 tablet  0  . buPROPion (WELLBUTRIN) 100 MG tablet Take 100 mg by mouth 2 (two) times daily.      . citalopram (CELEXA) 40 MG tablet Take 40 mg by mouth daily.      . cloNIDine (CATAPRES) 0.3 MG tablet Take 0.3 mg by mouth 2 (two) times daily.      . furosemide (LASIX) 20 MG tablet Take 1 tablet (20 mg total) by mouth 2 (two) times daily.  60  tablet  11  . hydrOXYzine (ATARAX/VISTARIL) 25 MG tablet Take 25 mg by mouth at bedtime as needed (sleep).      . loratadine (CLARITIN) 10 MG tablet Take 1 tablet (10 mg total) by mouth daily.  30 tablet  3  . Multiple Vitamin (MULTIVITAMIN WITH MINERALS) TABS Take 1 tablet by mouth daily.      . polyethylene glycol powder (GLYCOLAX/MIRALAX) powder Take 17 g by mouth daily as needed (constipation).       . potassium chloride (K-DUR,KLOR-CON) 10 MEQ tablet Take 1 tablet (10 mEq total) by mouth 2 (two) times daily with a meal.  60 tablet  11  . traZODone (DESYREL) 100 MG tablet Take 100 mg by mouth at bedtime.       No current facility-administered medications for this visit.     REVIEW OF SYSTEMS:   Eyes: Denies blurriness of vision Respiratory: Denies cough, dyspnea or wheezes Cardiovascular: Denies palpitation, chest discomfort or lower extremity swelling Gastrointestinal:  Denies nausea, heartburn or change in bowel habits Skin: Denies abnormal skin rashes Lymphatics: Denies new lymphadenopathy or easy bruising Neurological:Denies numbness, tingling or new weaknesses Behavioral/Psych: Mood is stable, no new changes  All other systems were reviewed with the patient and are negative.  PHYSICAL EXAMINATION: ECOG PERFORMANCE STATUS: 1 - Symptomatic but completely ambulatory  Filed Vitals:   05/23/13 1008  BP: 98/67  Pulse: 77  Temp: 98.4 F (36.9 C)  Resp: 18   Filed Weights  05/23/13 1008  Weight: 175 lb 1.6 oz (79.425 kg)    GENERAL:alert, no distress and comfortable SKIN: skin color, texture, turgor are normal, no rashes or significant lesions EYES: normal, Conjunctiva are pale and non-injected, sclera clear OROPHARYNX: Noticed swelling in her abdomen is with mucositis but no thrush. NECK: supple, thyroid normal size, non-tender, without nodularity LYMPH:  no palpable lymphadenopathy in the cervical, axillary or inguinal LUNGS: clear to auscultation and percussion with  normal breathing effort HEART: regular rate & rhythm and no murmurs and no lower extremity edema ABDOMEN:abdomen soft, non-tender and normal bowel sounds Musculoskeletal:no cyanosis of digits and no clubbing  NEURO: alert & oriented x 3 with fluent speech, no focal motor/sensory deficits The lesions seen on her buttock area is almost completely healed LABORATORY DATA:  I have reviewed the data as listed Results for orders placed in visit on 05/23/13 (from the past 48 hour(s))  CBC & DIFF AND RETIC     Status: Abnormal   Collection Time    05/23/13  9:38 AM      Result Value Ref Range   WBC 1.5 (*) 3.9 - 10.3 10e3/uL   NEUT# 0.0 (*) 1.5 - 6.5 10e3/uL   HGB 8.4 (*) 11.6 - 15.9 g/dL   HCT 26.4 (*) 34.8 - 46.6 %   Platelets 375  145 - 400 10e3/uL   MCV 75.1 (*) 79.5 - 101.0 fL   MCH 23.9 (*) 25.1 - 34.0 pg   MCHC 31.8  31.5 - 36.0 g/dL   RBC 3.52 (*) 3.70 - 5.45 10e6/uL   RDW 18.8 (*) 11.2 - 14.5 %   lymph# 1.2  0.9 - 3.3 10e3/uL   MONO# 0.2  0.1 - 0.9 10e3/uL   Eosinophils Absolute 0.0  0.0 - 0.5 10e3/uL   Basophils Absolute 0.0  0.0 - 0.1 10e3/uL   NEUT% 0.4 (*) 38.4 - 76.8 %   LYMPH% 83.7 (*) 14.0 - 49.7 %   MONO% 14.7 (*) 0.0 - 14.0 %   EOS% 1.2  0.0 - 7.0 %   BASO% 0.0  0.0 - 2.0 %   Retic % 0.77  0.70 - 2.10 %   Retic Ct Abs 27.60 (*) 33.70 - 90.70 10e3/uL   Immature Retic Fract 7.10  1.60 - 10.00 %  TECHNOLOGIST REVIEW     Status: None   Collection Time    05/23/13  9:38 AM      Result Value Ref Range   Technologist Review Variant lymphs present    COMPREHENSIVE METABOLIC PANEL (HW29)     Status: Abnormal   Collection Time    05/23/13  9:38 AM      Result Value Ref Range   Sodium 137  136 - 145 mEq/L   Potassium 4.3  3.5 - 5.1 mEq/L   Chloride 105  98 - 109 mEq/L   CO2 24  22 - 29 mEq/L   Glucose 107  70 - 140 mg/dl   BUN 15.3  7.0 - 26.0 mg/dL   Creatinine 1.2 (*) 0.6 - 1.1 mg/dL   Total Bilirubin 0.27  0.20 - 1.20 mg/dL   Alkaline Phosphatase 74  40 - 150 U/L    AST 9  5 - 34 U/L   ALT 7  0 - 55 U/L   Total Protein 9.4 (*) 6.4 - 8.3 g/dL   Albumin 2.7 (*) 3.5 - 5.0 g/dL   Calcium 9.8  8.4 - 10.4 mg/dL   Anion Gap 8  3 -  11 mEq/L    Lab Results  Component Value Date   WBC 1.5* 05/23/2013   HGB 8.4* 05/23/2013   HCT 26.4* 05/23/2013   MCV 75.1* 05/23/2013   PLT 375 05/23/2013   ASSESSMENT & PLAN:  #1 severe leukopenia #2 severe anemia #3 genital lesion #4 positive 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 We reviewed her scans and biopsy report. Overall, her over presentation could be suspicious for syphilis. Her sore is improving while on antibiotic therapy. I recommend infectious disease consultation but unfortunately she was not seen. I am requesting another consultation. I suspect the profound bone marrow suppression seen right now could be due to do doxycycline. Since her genital lesion is improving, I told her to reduce doxycycline to once a day. The anemia is likely anemia of chronic disease. The patient denies recent history of bleeding such as epistaxis, hematuria or hematochezia. She is asymptomatic from the anemia. We will observe for now.  She does not require transfusion now.  I will see her back next week for further evaluation. #5 mucositis #6 gum swelling Causes unknown. Hopefully with reducing the dose of doxycycline this will improve  All questions were answered. The patient knows to call the clinic with any problems, questions or concerns. No barriers to learning was detected.  I spent 25 minutes counseling the patient face to face. The total time spent in the appointment was 40 minutes and more than 50% was on counseling.     Tovey, Perry, MD 05/23/2013 11:49 AM

## 2013-05-23 NOTE — Telephone Encounter (Signed)
gv and printed appt sched and avs for pt for March....S.w. Ebony at [facility] and she will give info MD's and they will call pt with appt

## 2013-05-30 ENCOUNTER — Telehealth: Payer: Self-pay | Admitting: *Deleted

## 2013-05-30 ENCOUNTER — Encounter: Payer: Self-pay | Admitting: Hematology and Oncology

## 2013-05-30 ENCOUNTER — Ambulatory Visit (HOSPITAL_BASED_OUTPATIENT_CLINIC_OR_DEPARTMENT_OTHER): Payer: No Typology Code available for payment source | Admitting: Hematology and Oncology

## 2013-05-30 ENCOUNTER — Other Ambulatory Visit (HOSPITAL_BASED_OUTPATIENT_CLINIC_OR_DEPARTMENT_OTHER): Payer: No Typology Code available for payment source

## 2013-05-30 ENCOUNTER — Telehealth: Payer: Self-pay | Admitting: Hematology and Oncology

## 2013-05-30 VITALS — BP 100/64 | HR 88 | Temp 99.5°F | Resp 18 | Ht 66.0 in | Wt 170.2 lb

## 2013-05-30 DIAGNOSIS — D649 Anemia, unspecified: Secondary | ICD-10-CM

## 2013-05-30 DIAGNOSIS — L989 Disorder of the skin and subcutaneous tissue, unspecified: Secondary | ICD-10-CM

## 2013-05-30 DIAGNOSIS — K121 Other forms of stomatitis: Secondary | ICD-10-CM

## 2013-05-30 DIAGNOSIS — K1230 Oral mucositis (ulcerative), unspecified: Secondary | ICD-10-CM

## 2013-05-30 DIAGNOSIS — D72819 Decreased white blood cell count, unspecified: Secondary | ICD-10-CM

## 2013-05-30 DIAGNOSIS — A539 Syphilis, unspecified: Secondary | ICD-10-CM

## 2013-05-30 DIAGNOSIS — D539 Nutritional anemia, unspecified: Secondary | ICD-10-CM

## 2013-05-30 DIAGNOSIS — R221 Localized swelling, mass and lump, neck: Secondary | ICD-10-CM

## 2013-05-30 DIAGNOSIS — R591 Generalized enlarged lymph nodes: Secondary | ICD-10-CM

## 2013-05-30 DIAGNOSIS — R220 Localized swelling, mass and lump, head: Secondary | ICD-10-CM

## 2013-05-30 LAB — CBC & DIFF AND RETIC
HCT: 27.6 % — ABNORMAL LOW (ref 34.8–46.6)
HEMOGLOBIN: 8.6 g/dL — AB (ref 11.6–15.9)
IMMATURE RETIC FRACT: 3.5 % (ref 1.60–10.00)
MCH: 23.3 pg — ABNORMAL LOW (ref 25.1–34.0)
MCHC: 31.2 g/dL — ABNORMAL LOW (ref 31.5–36.0)
MCV: 74.8 fL — AB (ref 79.5–101.0)
Platelets: 331 10*3/uL (ref 145–400)
RBC: 3.69 10*6/uL — AB (ref 3.70–5.45)
RDW: 17.3 % — AB (ref 11.2–14.5)
RETIC %: 0.99 % (ref 0.70–2.10)
RETIC CT ABS: 36.53 10*3/uL (ref 33.70–90.70)
WBC: 2.1 10*3/uL — AB (ref 3.9–10.3)

## 2013-05-30 LAB — MANUAL DIFFERENTIAL
ALC: 1.8 10*3/uL (ref 0.9–3.3)
ANC (CHCC manual diff): 0 10*3/uL — CL (ref 1.5–6.5)
BAND NEUTROPHILS: 0 % (ref 0–10)
Basophil: 4 % — ABNORMAL HIGH (ref 0–2)
Blasts: 0 % (ref 0–0)
EOS: 0 % (ref 0–7)
LYMPH: 85 % — AB (ref 14–49)
MONO: 11 % (ref 0–14)
MYELOCYTES: 0 % (ref 0–0)
Metamyelocytes: 0 % (ref 0–0)
NRBC: 0 % (ref 0–0)
Other Cell: 0 % (ref 0–0)
PLT EST: ADEQUATE
PROMYELO: 0 % (ref 0–0)
SEG: 0 % — AB (ref 38–77)
VARIANT LYMPH: 0 % (ref 0–0)

## 2013-05-30 LAB — COMPREHENSIVE METABOLIC PANEL (CC13)
ALK PHOS: 73 U/L (ref 40–150)
ANION GAP: 9 meq/L (ref 3–11)
AST: 8 U/L (ref 5–34)
Albumin: 2.8 g/dL — ABNORMAL LOW (ref 3.5–5.0)
BILIRUBIN TOTAL: 0.27 mg/dL (ref 0.20–1.20)
BUN: 13.6 mg/dL (ref 7.0–26.0)
CO2: 24 mEq/L (ref 22–29)
CREATININE: 1.2 mg/dL — AB (ref 0.6–1.1)
Calcium: 9.9 mg/dL (ref 8.4–10.4)
Chloride: 105 mEq/L (ref 98–109)
Glucose: 99 mg/dl (ref 70–140)
Potassium: 4.1 mEq/L (ref 3.5–5.1)
SODIUM: 138 meq/L (ref 136–145)
TOTAL PROTEIN: 9.3 g/dL — AB (ref 6.4–8.3)

## 2013-05-30 LAB — HOLD TUBE, BLOOD BANK

## 2013-05-30 NOTE — Telephone Encounter (Signed)
Pt states on today's visit that she still has not heard about appt for Infectious Disease MD.  Left VM for infectious disease clinic ph [phone] on nurse's line asking about referral and to please return our call regarding referral and appt.[name]

## 2013-05-30 NOTE — Telephone Encounter (Signed)
Appt made w/ ID MD, Dr Tommy Medal at [address], [address] in [place], on 3/18 at 3:15 pm.  Their office number is [phone].  Notified Dr. Alvy Bimler and she instructs to move pt's appts here on 3/18 to 3/19 at 10:15 am for lab and 10:45 am to see Dr. Alvy Bimler.  POF sent.  Left VM for pt to call this nurse back so I can give her above information about appts.Morgan Bean

## 2013-05-30 NOTE — Telephone Encounter (Signed)
gave pt appt for lab and Md for 3/18

## 2013-05-30 NOTE — Progress Notes (Signed)
[name] OFFICE PROGRESS NOTE  [name], OLUGBEMIGA, MD DIAGNOSIS:  Severe pancytopenia, active syphilis, lymphadenopathy  SUMMARY OF HEMATOLOGIC HISTORY: [name] who is being referred here because of background of severe pancytopenia, lymphadenopathy and abnormal biopsy. Further evaluation suspect the patient may have syphilis. She was treated with doxycycline with worsening pancytopenia. Her antibiotic treatment was discontinued. INTERVAL HISTORY: Morgan Bean 48 y.o. female returns for further followup. Since I reduce her doxycycline to once a day, the genital lesion in the buttock region has gotten slightly worse. She said her persistent fatigue, come discomfort and anorexia with progressive weight loss. She denies any new lymphadenopathy.  I have reviewed the past medical history, past surgical history, social history and family history with the patient and they are unchanged from previous note.  ALLERGIES:  is allergic to ciprofloxacin.  MEDICATIONS:  Current Outpatient Prescriptions  Medication Sig Dispense Refill  . albuterol (PROVENTIL HFA;VENTOLIN HFA) 108 (90 BASE) MCG/ACT inhaler Inhale 2 puffs into the lungs every 4 (four) hours as needed for wheezing or shortness of breath.      [name] amLODipine (NORVASC) 10 MG tablet TAKE ONE TABLET DAILY  30 tablet  0  . aspirin 81 MG tablet Take 81 mg by mouth daily.      [name] buPROPion (WELLBUTRIN) 100 MG tablet Take 100 mg by mouth 2 (two) times daily.      . citalopram (CELEXA) 40 MG tablet Take 40 mg by mouth daily.      . cloNIDine (CATAPRES) 0.3 MG tablet Take 0.3 mg by mouth 2 (two) times daily.      . furosemide (LASIX) 20 MG tablet Take 1 tablet (20 mg total) by mouth 2 (two) times daily.  60 tablet  11  . hydrOXYzine (ATARAX/VISTARIL) 25 MG tablet Take 25 mg by mouth at bedtime as needed (sleep).      . loratadine (CLARITIN) 10 MG tablet Take 1 tablet (10 mg total) by mouth daily.  30 tablet   3  . Multiple Vitamin (MULTIVITAMIN WITH MINERALS) TABS Take 1 tablet by mouth daily.      . polyethylene glycol powder (GLYCOLAX/MIRALAX) powder Take 17 g by mouth daily as needed (constipation).       . potassium chloride (K-DUR,KLOR-CON) 10 MEQ tablet Take 1 tablet (10 mEq total) by mouth 2 (two) times daily with a meal.  60 tablet  11  . traZODone (DESYREL) 100 MG tablet Take 100 mg by mouth at bedtime.       No current facility-administered medications for this visit.     REVIEW OF SYSTEMS:   Constitutional: Denies fevers, chills or night sweats Eyes: Denies blurriness of vision Ears, nose, mouth, throat, and face: Denies mucositis or sore throat Respiratory: Denies cough, dyspnea or wheezes Cardiovascular: Denies palpitation, chest discomfort or lower extremity swelling Gastrointestinal:  Denies nausea, heartburn or change in bowel habits Skin: Denies abnormal skin rashes Neurological:Denies numbness, tingling or new weaknesses Behavioral/Psych: Mood is stable, no new changes  All other systems were reviewed with the patient and are negative.  PHYSICAL EXAMINATION: ECOG PERFORMANCE STATUS: 1 - Symptomatic but completely ambulatory  Filed Vitals:   05/30/13 1253  BP: 100/64  Pulse: 88  Temp: 99.5 F (37.5 C)  Resp: 18   Filed Weights   05/30/13 1253  Weight: 170 lb 3.2 oz (77.202 kg)    GENERAL:alert, no distress and comfortable SKIN: skin color, texture, turgor are normal, no rashes or significant lesions EYES: normal,  Conjunctiva are pink and non-injected, sclera clear OROPHARYNX:no exudate, no erythema and lips, buccal mucosa, and tongue normal . Noted gingival hypertrophy. Poor dentition is noted. NECK: supple, thyroid normal size, non-tender, without nodularity LYMPH:  Palpable small lymphadenopathy throughout the neck. LUNGS: clear to auscultation and percussion with normal breathing effort HEART: regular rate & rhythm and no murmurs and no lower extremity  edema ABDOMEN:abdomen soft, non-tender and normal bowel sounds Musculoskeletal:no cyanosis of digits and no clubbing  NEURO: alert & oriented x 3 with fluent speech, no focal motor/sensory deficits  LABORATORY DATA:  I have reviewed the data as listed Results for orders placed in visit on 05/30/13 (from the past 48 hour(s))  CBC & DIFF AND RETIC     Status: Abnormal   Collection Time    05/30/13 12:35 PM      Result Value Ref Range   WBC 2.1 (*) 3.9 - 10.3 10e3/uL   HGB 8.6 (*) 11.6 - 15.9 g/dL   HCT 27.6 (*) 34.8 - 46.6 %   Platelets 331  145 - 400 10e3/uL   MCV 74.8 (*) 79.5 - 101.0 fL   MCH 23.3 (*) 25.1 - 34.0 pg   MCHC 31.2 (*) 31.5 - 36.0 g/dL   RBC 3.69 (*) 3.70 - 5.45 10e6/uL   RDW 17.3 (*) 11.2 - 14.5 %   Retic % 0.99  0.70 - 2.10 %   Retic Ct Abs 36.53  33.70 - 90.70 10e3/uL   Immature Retic Fract 3.50  1.60 - 10.00 %  MANUAL DIFFERENTIAL     Status: Abnormal   Collection Time    05/30/13 12:35 PM      Result Value Ref Range   ANC ([facility] manual diff) 0.0 (*) 1.5 - 6.5 10e3/uL   ALC 1.8  0.9 - 3.3 10e3/uL   SEG 0 (*) 38 - 77 %   Band Neutrophils 0  0 - 10 %   LYMPH 85 (*) 14 - 49 %   MONO 11  0 - 14 %   EOS 0  0 - 7 %   Basophil 4 (*) 0 - 2 %   Metamyelocytes 0  0 - 0 %   Myelocytes 0  0 - 0 %   PROMYELO 0  0 - 0 %   Blasts 0  0 - 0 %   Variant Lymph 0  0 - 0 %   Other Cell 0  0 - 0 %   nRBC 0  0 - 0 %   Polychromasia Slight  Slight   RBC Comments Rouleaux Present  Within Normal Limits   White Cell Comments Variant Lymphs     PLT EST Adequate  Adequate  COMPREHENSIVE METABOLIC PANEL (ZG01)     Status: Abnormal   Collection Time    05/30/13 12:36 PM      Result Value Ref Range   Sodium 138  136 - 145 mEq/L   Potassium 4.1  3.5 - 5.1 mEq/L   Chloride 105  98 - 109 mEq/L   CO2 24  22 - 29 mEq/L   Glucose 99  70 - 140 mg/dl   BUN 13.6  7.0 - 26.0 mg/dL   Creatinine 1.2 (*) 0.6 - 1.1 mg/dL   Total Bilirubin 0.27  0.20 - 1.20 mg/dL   Alkaline Phosphatase  73  40 - 150 U/L   AST 8  5 - 34 U/L   ALT <6  0 - 55 U/L   Total Protein 9.3 (*)  6.4 - 8.3 g/dL   Albumin 2.8 (*) 3.5 - 5.0 g/dL   Calcium 9.9  8.4 - 10.4 mg/dL   Anion Gap 9  3 - 11 mEq/L  HOLD TUBE, BLOOD BANK     Status: None   Collection Time    05/30/13 12:38 PM      Result Value Ref Range   Hold Tube, Blood Bank Blood Bank Order Cancelled      Lab Results  Component Value Date   WBC 2.1* 05/30/2013   HGB 8.6* 05/30/2013   HCT 27.6* 05/30/2013   MCV 74.8* 05/30/2013   PLT 331 05/30/2013  ASSESSMENT & PLAN:  #1 severe leukopenia #2 severe anemia #3 genital lesion #4 positive 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 We reviewed her scans and biopsy report. Overall, her over presentation could be suspicious for syphilis. Her genital lesion is improving while on antibiotic therapy. I recommend infectious disease consultation but unfortunately she was not seen. I am requesting another consultation. She is still not being seen and I will attempt to contact the physician myself today. I suspect the profound bone marrow suppression seen right now could be due to do doxycycline. Previously, I reduced the doxycycline to once a day. Her genital lesion is worse with mild improvement of her blood count. I would discontinue doxycycline now. The anemia is likely anemia of chronic disease. The patient denies recent history of bleeding such as epistaxis, hematuria or hematochezia. She is asymptomatic from the anemia. We will observe for now.  She does not require transfusion now.  I will see her back next week for further evaluation. #5 mucositis #6 gum swelling Causes unknown. Hopefully with discontinuation of doxycycline this will improve  All questions were answered. The patient knows to call the clinic with any problems, questions or concerns. No barriers to learning was detected.  I spent 20 minutes counseling the patient face to face. The total time  spent in the appointment was 30 minutes and more than 50% was on counseling.     [facility], [place], MD 05/30/2013 1:38 PM

## 2013-06-02 ENCOUNTER — Inpatient Hospital Stay (HOSPITAL_COMMUNITY)
Admission: EM | Admit: 2013-06-02 | Discharge: 2013-06-09 | DRG: 808 | Disposition: A | Discharge status: Home / Self Care | Payer: No Typology Code available for payment source | Attending: Internal Medicine | Admitting: Internal Medicine

## 2013-06-02 ENCOUNTER — Encounter (HOSPITAL_COMMUNITY): Payer: Self-pay | Admitting: Emergency Medicine

## 2013-06-02 ENCOUNTER — Emergency Department (HOSPITAL_COMMUNITY): Payer: No Typology Code available for payment source

## 2013-06-02 ENCOUNTER — Other Ambulatory Visit: Payer: Self-pay

## 2013-06-02 DIAGNOSIS — F17200 Nicotine dependence, unspecified, uncomplicated: Secondary | ICD-10-CM | POA: Diagnosis present

## 2013-06-02 DIAGNOSIS — I10 Essential (primary) hypertension: Secondary | ICD-10-CM

## 2013-06-02 DIAGNOSIS — L0291 Cutaneous abscess, unspecified: Secondary | ICD-10-CM

## 2013-06-02 DIAGNOSIS — Z881 Allergy status to other antibiotic agents status: Secondary | ICD-10-CM

## 2013-06-02 DIAGNOSIS — L0390 Cellulitis, unspecified: Secondary | ICD-10-CM

## 2013-06-02 DIAGNOSIS — R05 Cough: Secondary | ICD-10-CM

## 2013-06-02 DIAGNOSIS — I2789 Other specified pulmonary heart diseases: Secondary | ICD-10-CM

## 2013-06-02 DIAGNOSIS — I509 Heart failure, unspecified: Secondary | ICD-10-CM | POA: Diagnosis present

## 2013-06-02 DIAGNOSIS — R5081 Fever presenting with conditions classified elsewhere: Secondary | ICD-10-CM

## 2013-06-02 DIAGNOSIS — A539 Syphilis, unspecified: Secondary | ICD-10-CM

## 2013-06-02 DIAGNOSIS — IMO0001 Reserved for inherently not codable concepts without codable children: Secondary | ICD-10-CM

## 2013-06-02 DIAGNOSIS — D759 Disease of blood and blood-forming organs, unspecified: Secondary | ICD-10-CM | POA: Diagnosis present

## 2013-06-02 DIAGNOSIS — Z79899 Other long term (current) drug therapy: Secondary | ICD-10-CM

## 2013-06-02 DIAGNOSIS — J209 Acute bronchitis, unspecified: Secondary | ICD-10-CM

## 2013-06-02 DIAGNOSIS — R609 Edema, unspecified: Secondary | ICD-10-CM | POA: Diagnosis present

## 2013-06-02 DIAGNOSIS — F1410 Cocaine abuse, uncomplicated: Secondary | ICD-10-CM

## 2013-06-02 DIAGNOSIS — I2510 Atherosclerotic heart disease of native coronary artery without angina pectoris: Secondary | ICD-10-CM

## 2013-06-02 DIAGNOSIS — J45909 Unspecified asthma, uncomplicated: Secondary | ICD-10-CM | POA: Diagnosis present

## 2013-06-02 DIAGNOSIS — F3289 Other specified depressive episodes: Secondary | ICD-10-CM | POA: Diagnosis present

## 2013-06-02 DIAGNOSIS — R0789 Other chest pain: Secondary | ICD-10-CM

## 2013-06-02 DIAGNOSIS — J0510 Acute epiglottitis without obstruction: Secondary | ICD-10-CM

## 2013-06-02 DIAGNOSIS — J0430 Supraglottitis, unspecified, without obstruction: Secondary | ICD-10-CM | POA: Diagnosis present

## 2013-06-02 DIAGNOSIS — R591 Generalized enlarged lymph nodes: Secondary | ICD-10-CM

## 2013-06-02 DIAGNOSIS — D638 Anemia in other chronic diseases classified elsewhere: Secondary | ICD-10-CM | POA: Diagnosis present

## 2013-06-02 DIAGNOSIS — R079 Chest pain, unspecified: Secondary | ICD-10-CM

## 2013-06-02 DIAGNOSIS — F339 Major depressive disorder, recurrent, unspecified: Secondary | ICD-10-CM

## 2013-06-02 DIAGNOSIS — F329 Major depressive disorder, single episode, unspecified: Secondary | ICD-10-CM | POA: Diagnosis present

## 2013-06-02 DIAGNOSIS — R053 Chronic cough: Secondary | ICD-10-CM

## 2013-06-02 DIAGNOSIS — N951 Menopausal and female climacteric states: Secondary | ICD-10-CM

## 2013-06-02 DIAGNOSIS — K224 Dyskinesia of esophagus: Secondary | ICD-10-CM | POA: Diagnosis not present

## 2013-06-02 DIAGNOSIS — D539 Nutritional anemia, unspecified: Secondary | ICD-10-CM

## 2013-06-02 DIAGNOSIS — E785 Hyperlipidemia, unspecified: Secondary | ICD-10-CM

## 2013-06-02 DIAGNOSIS — L989 Disorder of the skin and subcutaneous tissue, unspecified: Secondary | ICD-10-CM | POA: Diagnosis present

## 2013-06-02 DIAGNOSIS — F1910 Other psychoactive substance abuse, uncomplicated: Secondary | ICD-10-CM

## 2013-06-02 DIAGNOSIS — E43 Unspecified severe protein-calorie malnutrition: Secondary | ICD-10-CM

## 2013-06-02 DIAGNOSIS — R0609 Other forms of dyspnea: Secondary | ICD-10-CM

## 2013-06-02 DIAGNOSIS — I639 Cerebral infarction, unspecified: Secondary | ICD-10-CM

## 2013-06-02 DIAGNOSIS — I272 Pulmonary hypertension, unspecified: Secondary | ICD-10-CM

## 2013-06-02 DIAGNOSIS — Z8673 Personal history of transient ischemic attack (TIA), and cerebral infarction without residual deficits: Secondary | ICD-10-CM

## 2013-06-02 DIAGNOSIS — D259 Leiomyoma of uterus, unspecified: Secondary | ICD-10-CM

## 2013-06-02 DIAGNOSIS — D709 Neutropenia, unspecified: Principal | ICD-10-CM

## 2013-06-02 DIAGNOSIS — I5032 Chronic diastolic (congestive) heart failure: Secondary | ICD-10-CM

## 2013-06-02 DIAGNOSIS — D72819 Decreased white blood cell count, unspecified: Secondary | ICD-10-CM

## 2013-06-02 DIAGNOSIS — R599 Enlarged lymph nodes, unspecified: Secondary | ICD-10-CM

## 2013-06-02 DIAGNOSIS — T364X5A Adverse effect of tetracyclines, initial encounter: Secondary | ICD-10-CM | POA: Diagnosis present

## 2013-06-02 LAB — CBC WITH DIFFERENTIAL/PLATELET
BASOS PCT: 0 % (ref 0–1)
Basophils Absolute: 0 10*3/uL (ref 0.0–0.1)
EOS ABS: 0 10*3/uL (ref 0.0–0.7)
EOS PCT: 0 % (ref 0–5)
HCT: 26.7 % — ABNORMAL LOW (ref 36.0–46.0)
Hemoglobin: 8.5 g/dL — ABNORMAL LOW (ref 12.0–15.0)
LYMPHS ABS: 1.3 10*3/uL (ref 0.7–4.0)
Lymphocytes Relative: 85 % — ABNORMAL HIGH (ref 12–46)
MCH: 23.5 pg — ABNORMAL LOW (ref 26.0–34.0)
MCHC: 31.8 g/dL (ref 30.0–36.0)
MCV: 74 fL — AB (ref 78.0–100.0)
MONO ABS: 0.2 10*3/uL (ref 0.1–1.0)
Monocytes Relative: 14 % — ABNORMAL HIGH (ref 3–12)
Neutro Abs: 0 10*3/uL — ABNORMAL LOW (ref 1.7–7.7)
Neutrophils Relative %: 1 % — ABNORMAL LOW (ref 43–77)
Platelets: 347 10*3/uL (ref 150–400)
RBC: 3.61 MIL/uL — AB (ref 3.87–5.11)
RDW: 16.8 % — ABNORMAL HIGH (ref 11.5–15.5)
WBC: 1.5 10*3/uL — ABNORMAL LOW (ref 4.0–10.5)

## 2013-06-02 LAB — BASIC METABOLIC PANEL
BUN: 9 mg/dL (ref 6–23)
CALCIUM: 9.4 mg/dL (ref 8.4–10.5)
CO2: 22 meq/L (ref 19–32)
CREATININE: 1.03 mg/dL (ref 0.50–1.10)
Chloride: 95 mEq/L — ABNORMAL LOW (ref 96–112)
GFR calc Af Amer: 73 mL/min — ABNORMAL LOW (ref >90)
GFR calc non Af Amer: 63 mL/min — ABNORMAL LOW (ref >90)
GLUCOSE: 100 mg/dL — AB (ref 70–99)
Potassium: 3.8 mEq/L (ref 3.7–5.3)
Sodium: 132 mEq/L — ABNORMAL LOW (ref 137–147)

## 2013-06-02 LAB — URINALYSIS, ROUTINE W REFLEX MICROSCOPIC
BILIRUBIN URINE: NEGATIVE
GLUCOSE, UA: NEGATIVE mg/dL
KETONES UR: 15 mg/dL — AB
Leukocytes, UA: NEGATIVE
Nitrite: NEGATIVE
PROTEIN: 30 mg/dL — AB
Specific Gravity, Urine: >1.046 — ABNORMAL HIGH (ref 1.005–1.030)
Urobilinogen, UA: 0.2 mg/dL (ref 0.0–1.0)
pH: 5 (ref 5.0–8.0)

## 2013-06-02 LAB — URINE MICROSCOPIC-ADD ON

## 2013-06-02 LAB — TROPONIN I: Troponin I: <0.3 ng/mL (ref <0.30)

## 2013-06-02 LAB — RAPID HIV SCREEN (WH-MAU): SUDS RAPID HIV SCREEN: NONREACTIVE

## 2013-06-02 LAB — PRO B NATRIURETIC PEPTIDE: Pro B Natriuretic peptide (BNP): 500.8 pg/mL — ABNORMAL HIGH (ref 0–125)

## 2013-06-02 MED ORDER — CEFEPIME HCL 2 G IJ SOLR
2.0000 g | Freq: Three times a day (TID) | INTRAMUSCULAR | Status: DC
  Administered 2013-06-03 – 2013-06-08 (×15): 2 g via INTRAVENOUS
  Filled 2013-06-02 (×20): qty 2

## 2013-06-02 MED ORDER — GI COCKTAIL ~~LOC~~
30.0000 mL | Freq: Once | ORAL | Status: AC
  Administered 2013-06-02: 30 mL via ORAL
  Filled 2013-06-02: qty 30

## 2013-06-02 MED ORDER — IOHEXOL 300 MG/ML  SOLN
100.0000 mL | Freq: Once | INTRAMUSCULAR | Status: AC | PRN
  Administered 2013-06-02: 80 mL via INTRAVENOUS

## 2013-06-02 MED ORDER — MORPHINE SULFATE 4 MG/ML IJ SOLN
4.0000 mg | Freq: Once | INTRAMUSCULAR | Status: AC
  Administered 2013-06-02: 4 mg via INTRAVENOUS
  Filled 2013-06-02: qty 1

## 2013-06-02 MED ORDER — ACETAMINOPHEN 160 MG/5ML PO SOLN
1000.0000 mg | Freq: Once | ORAL | Status: AC
  Administered 2013-06-02: 1000 mg via ORAL
  Filled 2013-06-02: qty 40.6

## 2013-06-02 MED ORDER — CLINDAMYCIN PHOSPHATE 600 MG/50ML IV SOLN
600.0000 mg | Freq: Once | INTRAVENOUS | Status: AC
  Administered 2013-06-02: 600 mg via INTRAVENOUS
  Filled 2013-06-02: qty 50

## 2013-06-02 MED ORDER — DEXTROSE 5 % IV SOLN
2.0000 g | INTRAVENOUS | Status: AC
  Administered 2013-06-02: 2 g via INTRAVENOUS
  Filled 2013-06-02: qty 2

## 2013-06-02 MED ORDER — IPRATROPIUM-ALBUTEROL 0.5-2.5 (3) MG/3ML IN SOLN
3.0000 mL | Freq: Once | RESPIRATORY_TRACT | Status: AC
  Administered 2013-06-02: 3 mL via RESPIRATORY_TRACT
  Filled 2013-06-02: qty 3

## 2013-06-02 MED ORDER — ACETAMINOPHEN 500 MG PO TABS: 1000.0000 mg | ORAL_TABLET | Freq: Once | ORAL | Status: DC

## 2013-06-02 NOTE — ED Provider Notes (Signed)
CSN: [id number]     Arrival date & time 06/02/13  1649 History   First MD Initiated Contact with Patient 06/02/13 1723     Chief Complaint  Patient presents with  . Oral Swelling     (Consider location/radiation/quality/duration/timing/severity/associated sxs/prior Treatment) The history is provided by the patient.  Aide Wojnar is a 48 y.o. female hx of CAD, pulmonary hypertension, neck lymphadenopathy secondary to syphilis here with worsening neck swelling, SOB.  She was recently diagnosed with syphilis causing her neck adenopathy. She was started on doxycycline but because her neutropenia so it was stopped about 2 days ago. Since then she noticed that she had worsening swelling in the neck as well as shortness of breath. Denies any fevers at home. She said that is difficult for her to swallow but she is able to take her medicines today.   She had extensive workup for the neck mass. She had both a tissue biopsy as well as bone biopsy done that did not show any cancer. She had a positive RPR and was soon to have syphilis causing her symptoms. She is suppose to see ID in a week. She also had CT angio 2 months ago that showed no PE.    Past Medical History  Diagnosis Date  . Coronary artery disease     Lexiscan myoview (10/12) with significant ST depression upon Lexiscan injection but no ischemia or infarction on perfusion images. Left heart cath (10/12): 30% mid LAD, 30% ostial D1, 50% ostial D2.    [name] Hypertension   . Tricuspid regurgitation   . Iron deficiency     hx of  . Polysubstance abuse     Prior cocaine  . Tobacco abuse   . Leukocytoclastic vasculitis     Rash across lower body, occurred in 9/11, diagnosed by skin biopsy. ANA positive. Thought to be secondary to cocaine use.  . Pulmonary HTN     Echo (9/11) with EF 65%, mild LVH, mild AI, mild MR, moderate to possibly severe TR with PA systolic pressure 52 mmHg. Echo (10/12): severe LV hypertrophy, EF 60-65%, mild MR, mild AI,  moderate to severe tricuspid regurgitation, PA systolic pressure 38 mmHg.  Echo 4/14: moderate LVH, EF 65%, normal wall motion, diastolic dysfunction, mild AI, mild MR  . Asthma   . Shortness of breath     a. PFTs 5/14:  FEV1/FVC 99% predicted; FVC 60% predicted; DLCO mildly reduced, mild restriction, air trapping.;  b.  seen by pulmo (Dr. [name]) 5/14: mainly upper airway symptoms - ACE d/c'd (sx's better)  . Chronic diastolic CHF (congestive heart failure)   . Constipation   . Lymphadenopathy 03/21/2013  . Leukopenia 03/21/2013  . Unspecified deficiency anemia 03/29/2013  . Anginal pain     none in past year  . Anxiety   . Mental disorder   . Depression   . Stroke 2010ish  . Arthritis   . Pancytopenia   . Hyperlipemia   . Syphilis 04/25/2013   Past Surgical History  Procedure Laterality Date  . Ankle surgery      right  . Cardiac catheterization    . I&d extremity  08/09/2011    Procedure: IRRIGATION AND DEBRIDEMENT EXTREMITY;  Surgeon: [name], MD;  Location: [facility];  Service: General;  Laterality: Left;  i & D Left axilla abscess  . Lymph node biopsy N/A 04/05/2013    Procedure: EXCISIONAL BIOPSY RIGHT SUBMANDIBULAR NODE, NASAL ENDOSCOPIC WITH BIOPSY NASAL PHARYNX;  Surgeon: [name], MD;  Location: [facility] OR;  Service: ENT;  Laterality: N/A;   Family History  Problem Relation Age of Onset  . Coronary artery disease Mother   . Diabetes Mother   . Breast cancer Maternal Grandmother   . Diabetes Maternal Grandmother   . Diabetes Father   . Hypertension Father   . Coronary artery disease Father    History  Substance Use Topics  . Smoking status: Former Smoker -- 0.25 packs/day for 30 years    Types: Cigarettes    Quit date: 05/23/2013  . Smokeless tobacco: Never Used     Comment: 5 cigs --- uses E-cig  . Alcohol Use: No     Comment: pint of wine 2-3 times a week--ocassional   OB History   Grav Para Term Preterm Abortions TAB SAB Ect Mult Living   4 2 2  2 2    2       Review of Systems  Respiratory: Positive for shortness of breath.   Musculoskeletal:       Neck swelling   All other systems reviewed and are negative.      Allergies  Ciprofloxacin and Lipitor  Home Medications   Current Outpatient Rx  Name  Route  Sig  Dispense  Refill  . albuterol (PROVENTIL HFA;VENTOLIN HFA) 108 (90 BASE) MCG/ACT inhaler   Inhalation   Inhale 2 puffs into the lungs every 4 (four) hours as needed for wheezing or shortness of breath.         [name] amLODipine (NORVASC) 10 MG tablet   Oral   Take 10 mg by mouth daily.         [name] aspirin 81 MG tablet   Oral   Take 81 mg by mouth daily.         [name] buPROPion (WELLBUTRIN) 100 MG tablet   Oral   Take 100 mg by mouth 2 (two) times daily.         . citalopram (CELEXA) 40 MG tablet   Oral   Take 40 mg by mouth daily.         . cloNIDine (CATAPRES) 0.3 MG tablet   Oral   Take 0.3 mg by mouth 2 (two) times daily.         . furosemide (LASIX) 20 MG tablet   Oral   Take 1 tablet (20 mg total) by mouth 2 (two) times daily.   60 tablet   11   . hydrOXYzine (ATARAX/VISTARIL) 25 MG tablet   Oral   Take 25 mg by mouth at bedtime as needed (sleep).         [name] ibuprofen (ADVIL,MOTRIN) 200 MG tablet   Oral   Take 1,000 mg by mouth once as needed for headache.         . loratadine (CLARITIN) 10 MG tablet   Oral   Take 1 tablet (10 mg total) by mouth daily.   30 tablet   3   . Multiple Vitamin (MULTIVITAMIN WITH MINERALS) TABS   Oral   Take 1 tablet by mouth daily.         . polyethylene glycol powder (GLYCOLAX/MIRALAX) powder   Oral   Take 17 g by mouth daily as needed (constipation).          . potassium chloride (K-DUR,KLOR-CON) 10 MEQ tablet   Oral   Take 1 tablet (10 mEq total) by mouth 2 (two) times daily with a meal.   60 tablet   11   . traZODone (DESYREL)  100 MG tablet   Oral   Take 100 mg by mouth at bedtime.          BP 105/72  Pulse 109  Temp(Src) 102.5 F  (39.2 C) (Oral)  Resp 22  Ht 5\' 6"  (1.676 m)  Wt 170 lb 3.2 oz (77.202 kg)  BMI 27.48 kg/m2  SpO2 100%  LMP 04/04/2013 Physical Exam  Nursing note and vitals reviewed. Constitutional: She is oriented to person, place, and time.  Slightly tachypneic, talking in full sentences   HENT:  Head: Normocephalic.  OP clear   Eyes: Conjunctivae and EOM are normal. Pupils are equal, round, and reactive to light.  Neck: Normal range of motion.  R submandibular area swelling. No stridor.   Cardiovascular: Normal rate, regular rhythm and normal heart sounds.   Pulmonary/Chest:  Slightly tachypneic, + expiratory wheezing, no crackles   Abdominal: Soft. Bowel sounds are normal. She exhibits no distension. There is no tenderness. There is no rebound.  Musculoskeletal: Normal range of motion. She exhibits no edema and no tenderness.  Neurological: She is alert and oriented to person, place, and time. No cranial nerve deficit. Coordination normal.  Skin: Skin is warm and dry.  Psychiatric: She has a normal mood and affect. Her behavior is normal. Judgment and thought content normal.    ED Course  Procedures (including critical care time)  CRITICAL CARE Performed by: Darl Householder, Raylee Strehl   Total critical care time: 30 min   Critical care time was exclusive of separately billable procedures and treating other patients.  Critical care was necessary to treat or prevent imminent or life-threatening deterioration.  Critical care was time spent personally by me on the following activities: development of treatment plan with patient and/or surrogate as well as nursing, discussions with consultants, evaluation of patient's response to treatment, examination of patient, obtaining history from patient or surrogate, ordering and performing treatments and interventions, ordering and review of laboratory studies, ordering and review of radiographic studies, pulse oximetry and re-evaluation of patient's  condition.   Labs Review Labs Reviewed  CBC WITH DIFFERENTIAL - Abnormal; Notable for the following:    WBC 1.5 (*)    RBC 3.61 (*)    Hemoglobin 8.5 (*)    HCT 26.7 (*)    MCV 74.0 (*)    MCH 23.5 (*)    RDW 16.8 (*)    Neutrophils Relative % 1 (*)    Lymphocytes Relative 85 (*)    Monocytes Relative 14 (*)    Neutro Abs 0.0 (*)    All other components within normal limits  BASIC METABOLIC PANEL - Abnormal; Notable for the following:    Sodium 132 (*)    Chloride 95 (*)    Glucose, Bld 100 (*)    GFR calc non Af Amer 63 (*)    GFR calc Af Amer 73 (*)    All other components within normal limits  PRO B NATRIURETIC PEPTIDE - Abnormal; Notable for the following:    Pro B Natriuretic peptide (BNP) 500.8 (*)    All other components within normal limits  URINE CULTURE  CULTURE, BLOOD (ROUTINE X 2)  CULTURE, BLOOD (ROUTINE X 2)  AFB CULTURE, BLOOD  TROPONIN I  PATHOLOGIST SMEAR REVIEW  URINALYSIS, ROUTINE W REFLEX MICROSCOPIC  RAPID HIV SCREEN [facility])   Imaging Review Dg Neck Soft Tissue  06/02/2013   CLINICAL DATA:  Sore throat  EXAM: NECK SOFT TISSUES - 1+ VIEW  COMPARISON:  02/15/2013  FINDINGS: No acute  bony abnormality is seen. There is some steepling of the subglottic airway and mild narrowing in the AP dimension on the lateral projection. Some soft tissue fullness is noted just above the thyroid cartilage. This has increased somewhat in the interval from the prior CT examination. No acute bony abnormality is seen. Some chronic calcifications are noted.  IMPRESSION: Soft tissue fullness just above the thyroid cartilage in the proximal trachea/subglottic airway. This has increased from the prior CT examination. Direct visualization may be helpful.  The epiglottis is stable in appearance from the prior exam.   Electronically Signed   By: Inez Catalina M.D.   On: 06/02/2013 19:00   Dg Chest 2 View  06/02/2013   CLINICAL DATA:  Shortness of breath  EXAM: CHEST  2 VIEW   COMPARISON:  05/04/2013  FINDINGS: Cardiac shadow is within normal limits. Lungs are clear bilaterally. No sizable effusion is noted. No bony abnormality is seen.  IMPRESSION: No acute abnormality noted.   Electronically Signed   By: Inez Catalina M.D.   On: 06/02/2013 18:56   Ct Soft Tissue Neck W Contrast  06/02/2013   CLINICAL DATA:  Neck swelling and pain.  Next item  EXAM: CT NECK WITH CONTRAST  TECHNIQUE: Multidetector CT imaging of the neck was performed using the standard protocol following the bolus administration of intravenous contrast.  CONTRAST:  35mL OMNIPAQUE IOHEXOL 300 MG/ML  SOLN  COMPARISON:  DG NECK SOFT TISSUE dated 06/02/2013; CT NECK W/CM dated 02/15/2013; NM PET IMAGE INITIAL (PI) SKULL BASE TO THIGH dated 03/29/2013  FINDINGS: Extensive mucosal thickening is present throughout the oropharynx hypopharynx and larynx. Prominent posterior mucosal thickening within the hypopharynx narrows the airway. The epiglottis is inflamed. There is marked enhancement of the palatine tonsils bilaterally without abscess. Edematous changes are present in the parapharyngeal space bilaterally.  Multiple enlarged hyper enhancing nodes are present bilaterally. There are enlarged nodes in the submandibular space bilaterally, measuring up to 17 x 13 mm on the right and 13 x 20 mm on the left. An enlarged right submental lymph node is stable at 16 x 12 mm. There is increased edematous changes throughout the anterior neck surrounding the strap muscles and thyroid. No discrete thyroid nodule is evident. Bilateral cervical adenopathy is similar to the prior studies. An anterior right level 2 lymph node measures 16 x 22 mm on the sagittal reformats. The anterior left level 2 lymph node measures 19 x 13 mm.  Degenerative changes of the cervical spine are most evident at C5-6 with uncovertebral and foraminal narrowing bilaterally.  IMPRESSION: 1. Extensive edematous changes throughout the oropharynx, hypopharynx, and larynx  compatible with a diffuse pharyngitis. No discrete abscess is present. 2. Prominent swelling the epiglottis compatible with epiglottitis. 3. Extensive bilateral cervical adenopathy, most prominent level 1 L2 stations. 4. Increase in diffuse edema within the deep cervical spaces and surrounding strap muscles and thyroid. No focal thyroid lesion is evident. 5. Focal narrowing of the airway within the hypopharynx secondary to extensive posterior pharyngeal edema.   Electronically Signed   By: Lawrence Santiago M.D.   On: 06/02/2013 20:20     EKG Interpretation None      MDM   Final diagnoses:  None   Oona Stinebaugh is a 48 y.o. female here with neck mass with worsening neck swelling. No obvious stridor so I think there is no airway involvement. I think she will need another course of abx for treatment of syphilis but will check cbc given recent  neutropenia. She is wheezing on exam and had pneumonia recently so will give nebs and check CXR.   7:30 PM Patient was neutropenic. I called ID, Dr. Baxter Flattery, who reviewed her chart. She developed fever so will admit for neutropenic fever and will start cefepime. She will see her in AM.   8:30 PM Xray showed increased swelling. CT showed epiglottitis and pharyngeal edema. I called Dr. Redmond Baseman from ENT, who will look at CT and will scope her tomorrow. He recommends [facility] and transfer to [facility].   9:24 PM Still no stridor currently. Will admit to stepdown at [facility] for neutropenic fever, epiglottitis.     Wandra Arthurs, MD 06/02/13 2125

## 2013-06-02 NOTE — ED Notes (Signed)
[facility] called for transport. 

## 2013-06-02 NOTE — H&P (Signed)
[facility] History and Physical  Morgan Bean KVQ:[id number] DOB: [birth year] DOA: 06/02/2013  Referring physician:  PCP: [name], MD  Specialists:   Chief Complaint: Neck swelling  HPI: Morgan Bean is a 48 y.o. female  With a history of coronary artery disease, diastolic CHF, lymphadenopathy that presents emergency department for neck swelling and sore throat. Patient has been experiencing this for approximately 4 months for which she had a workup. At one point patient was told she had syphilis. She had been placed on doxycycline for the past month however this was discontinued 2 days ago to 2 neutropenia. Patient currently complains of some shortness of breath however states this has been ongoing. Patient states that she's had sore throat today and was unable to take her medications she was unable to swallow them. She has had again extensive workup for this neck mass including tissue biopsy as well as bone marrow biopsy which did not show any malignancy. Patient did have a positive RPR. At this time she denies any dizziness or chest pain or abdominal pain. She is currently able to speak in full sentences without interruption.  Review of Systems:  Constitutional: Denies fever, chills, diaphoresis, appetite change and fatigue.  HEENT: Complains of neck swelling and sore throat. Respiratory: Complains of shortness of breath however states this is normal for her. Cardiovascular: Denies chest pain, palpitations and leg swelling.  Gastrointestinal: Denies nausea, vomiting, abdominal pain, diarrhea, constipation, blood in stool and abdominal distention.  Genitourinary: Denies dysuria, urgency, frequency, hematuria, flank pain and difficulty urinating.  Musculoskeletal: Denies myalgias, back pain, joint swelling, arthralgias and gait problem.  Skin: Denies pallor, rash and wound.  Neurological: Denies dizziness, seizures, syncope, weakness, light-headedness, numbness and headaches.   Hematological: Denies adenopathy. Easy bruising, personal or family bleeding history  Psychiatric/Behavioral: Denies suicidal ideation, mood changes, confusion, nervousness, sleep disturbance and agitation  Past Medical History  Diagnosis Date  . Coronary artery disease     Lexiscan myoview (10/12) with significant ST depression upon Lexiscan injection but no ischemia or infarction on perfusion images. Left heart cath (10/12): 30% mid LAD, 30% ostial D1, 50% ostial D2.    [name] Hypertension   . Tricuspid regurgitation   . Iron deficiency     hx of  . Polysubstance abuse     Prior cocaine  . Tobacco abuse   . Leukocytoclastic vasculitis     Rash across lower body, occurred in 9/11, diagnosed by skin biopsy. ANA positive. Thought to be secondary to cocaine use.  . Pulmonary HTN     Echo (9/11) with EF 65%, mild LVH, mild AI, mild MR, moderate to possibly severe TR with PA systolic pressure 52 mmHg. Echo (10/12): severe LV hypertrophy, EF 60-65%, mild MR, mild AI, moderate to severe tricuspid regurgitation, PA systolic pressure 38 mmHg.  Echo 4/14: moderate LVH, EF 65%, normal wall motion, diastolic dysfunction, mild AI, mild MR  . Asthma   . Shortness of breath     a. PFTs 5/14:  FEV1/FVC 99% predicted; FVC 60% predicted; DLCO mildly reduced, mild restriction, air trapping.;  b.  seen by pulmo (Dr. [name]) 5/14: mainly upper airway symptoms - ACE d/c'd (sx's better)  . Chronic diastolic CHF (congestive heart failure)   . Constipation   . Lymphadenopathy 03/21/2013  . Leukopenia 03/21/2013  . Unspecified deficiency anemia 03/29/2013  . Anginal pain     none in past year  . Anxiety   . Mental disorder   . Depression   . Stroke  2010ish  . Arthritis   . Pancytopenia   . Hyperlipemia   . Syphilis 04/25/2013   Past Surgical History  Procedure Laterality Date  . Ankle surgery      right  . Cardiac catheterization    . I&d extremity  08/09/2011    Procedure: IRRIGATION AND DEBRIDEMENT  EXTREMITY;  Surgeon: [name], MD;  Location: [facility];  Service: General;  Laterality: Left;  i & D Left axilla abscess  . Lymph node biopsy N/A 04/05/2013    Procedure: EXCISIONAL BIOPSY RIGHT SUBMANDIBULAR NODE, NASAL ENDOSCOPIC WITH BIOPSY NASAL PHARYNX;  Surgeon: [name], MD;  Location: [facility];  Service: ENT;  Laterality: N/A;   Social History:  reports that she quit smoking 10 days ago. Her smoking use included Cigarettes. She has a 7.5 pack-year smoking history. She has never used smokeless tobacco. She reports that she uses illicit drugs ("Crack" cocaine) about once per week. She reports that she does not drink alcohol.   Allergies  Allergen Reactions  . Ciprofloxacin Itching  . Lipitor [Atorvastatin] Swelling    Family History  Problem Relation Age of Onset  . Coronary artery disease Mother   . Diabetes Mother   . Breast cancer Maternal Grandmother   . Diabetes Maternal Grandmother   . Diabetes Father   . Hypertension Father   . Coronary artery disease Father     Prior to Admission medications   Medication Sig Start Date End Date Taking? Authorizing Provider  albuterol (PROVENTIL HFA;VENTOLIN HFA) 108 (90 BASE) MCG/ACT inhaler Inhale 2 puffs into the lungs every 4 (four) hours as needed for wheezing or shortness of breath. 06/15/12 06/15/13 Yes Theodis Blaze, MD  amLODipine (NORVASC) 10 MG tablet Take 10 mg by mouth daily.   Yes Historical Provider, MD  aspirin 81 MG tablet Take 81 mg by mouth daily.   Yes Historical Provider, MD  buPROPion (WELLBUTRIN) 100 MG tablet Take 100 mg by mouth 2 (two) times daily. 03/22/13  Yes Historical Provider, MD  citalopram (CELEXA) 40 MG tablet Take 40 mg by mouth daily. 06/15/12  Yes Theodis Blaze, MD  cloNIDine (CATAPRES) 0.3 MG tablet Take 0.3 mg by mouth 2 (two) times daily. 07/13/12 07/13/13 Yes Scott T Weaver, PA-C  furosemide (LASIX) 20 MG tablet Take 1 tablet (20 mg total) by mouth 2 (two) times daily. 07/13/12  Yes Liliane Shi,  PA-C  hydrOXYzine (ATARAX/VISTARIL) 25 MG tablet Take 25 mg by mouth at bedtime as needed (sleep).   Yes Historical Provider, MD  ibuprofen (ADVIL,MOTRIN) 200 MG tablet Take 1,000 mg by mouth once as needed for headache.   Yes Historical Provider, MD  loratadine (CLARITIN) 10 MG tablet Take 1 tablet (10 mg total) by mouth daily. 10/31/12  Yes Shanker Kristeen Mans, MD  Multiple Vitamin (MULTIVITAMIN WITH MINERALS) TABS Take 1 tablet by mouth daily. 11/18/11  Yes Milana Huntsman Readling, MD  polyethylene glycol powder (GLYCOLAX/MIRALAX) powder Take 17 g by mouth daily as needed (constipation).    Yes Historical Provider, MD  potassium chloride (K-DUR,KLOR-CON) 10 MEQ tablet Take 1 tablet (10 mEq total) by mouth 2 (two) times daily with a meal. 07/13/12  Yes Scott T Kathlen Mody, PA-C  traZODone (DESYREL) 100 MG tablet Take 100 mg by mouth at bedtime. 06/15/12  Yes Theodis Blaze, MD   Physical Exam: Filed Vitals:   06/02/13 2130  BP: 115/67  Pulse: 103  Temp: 98.1 F (36.7 C)  Resp: 16  General: Well developed, well nourished, NAD, appears stated age  HEENT: NCAT, PERRLA, EOMI, Anicteic Sclera, mucous membranes moist.   Neck: Supple, no JVD, right submandibular swelling, no stridor noted  Cardiovascular: S1 S2 auscultated, no rubs, murmurs or gallops. Regular rate and rhythm.  Respiratory: Essentially clear with some expiratory wheezing, slightly tachypneic   Abdomen: Soft, nontender, nondistended, + bowel sounds  Extremities: warm dry without cyanosis clubbing or edema  Neuro: AAOx3, cranial nerves grossly intact. Strength 5/5 in patient's upper and lower extremities bilaterally  Skin: Without rashes exudates or nodules  Psych: Normal affect and demeanor with intact judgement and insight  Labs on Admission:  Basic Metabolic Panel:  Recent Labs Lab 05/30/13 1236 06/02/13 1800  NA 138 132*  K 4.1 3.8  CL  --  95*  CO2 24 22  GLUCOSE 99 100*  BUN 13.6 9  CREATININE 1.2* 1.03  CALCIUM  9.9 9.4   Liver Function Tests:  Recent Labs Lab 05/30/13 1236  AST 8  ALT <6  ALKPHOS 73  BILITOT 0.27  PROT 9.3*  ALBUMIN 2.8*   No results found for this basename: LIPASE, AMYLASE,  in the last 168 hours No results found for this basename: AMMONIA,  in the last 168 hours CBC:  Recent Labs Lab 05/30/13 1235 06/02/13 1800  WBC 2.1* 1.5*  NEUTROABS  --  0.0*  HGB 8.6* 8.5*  HCT 27.6* 26.7*  MCV 74.8* 74.0*  PLT 331 347   Cardiac Enzymes:  Recent Labs Lab 06/02/13 1800  TROPONINI <0.30    BNP (last 3 results)  Recent Labs  04/16/13 1616 05/02/13 1010 06/02/13 1800  PROBNP 415.6* 132.0* 500.8*   CBG: No results found for this basename: GLUCAP,  in the last 168 hours  Radiological Exams on Admission: Dg Neck Soft Tissue  06/02/2013   CLINICAL DATA:  Sore throat  EXAM: NECK SOFT TISSUES - 1+ VIEW  COMPARISON:  02/15/2013  FINDINGS: No acute bony abnormality is seen. There is some steepling of the subglottic airway and mild narrowing in the AP dimension on the lateral projection. Some soft tissue fullness is noted just above the thyroid cartilage. This has increased somewhat in the interval from the prior CT examination. No acute bony abnormality is seen. Some chronic calcifications are noted.  IMPRESSION: Soft tissue fullness just above the thyroid cartilage in the proximal trachea/subglottic airway. This has increased from the prior CT examination. Direct visualization may be helpful.  The epiglottis is stable in appearance from the prior exam.   Electronically Signed   By: Inez Catalina M.D.   On: 06/02/2013 19:00   Dg Chest 2 View  06/02/2013   CLINICAL DATA:  Shortness of breath  EXAM: CHEST  2 VIEW  COMPARISON:  05/04/2013  FINDINGS: Cardiac shadow is within normal limits. Lungs are clear bilaterally. No sizable effusion is noted. No bony abnormality is seen.  IMPRESSION: No acute abnormality noted.   Electronically Signed   By: Inez Catalina M.D.   On: 06/02/2013  18:56   Ct Soft Tissue Neck W Contrast  06/02/2013   CLINICAL DATA:  Neck swelling and pain.  Next item  EXAM: CT NECK WITH CONTRAST  TECHNIQUE: Multidetector CT imaging of the neck was performed using the standard protocol following the bolus administration of intravenous contrast.  CONTRAST:  32mL OMNIPAQUE IOHEXOL 300 MG/ML  SOLN  COMPARISON:  DG NECK SOFT TISSUE dated 06/02/2013; CT NECK W/CM dated 02/15/2013; NM PET IMAGE INITIAL (PI) SKULL BASE TO  THIGH dated 03/29/2013  FINDINGS: Extensive mucosal thickening is present throughout the oropharynx hypopharynx and larynx. Prominent posterior mucosal thickening within the hypopharynx narrows the airway. The epiglottis is inflamed. There is marked enhancement of the palatine tonsils bilaterally without abscess. Edematous changes are present in the parapharyngeal space bilaterally.  Multiple enlarged hyper enhancing nodes are present bilaterally. There are enlarged nodes in the submandibular space bilaterally, measuring up to 17 x 13 mm on the right and 13 x 20 mm on the left. An enlarged right submental lymph node is stable at 16 x 12 mm. There is increased edematous changes throughout the anterior neck surrounding the strap muscles and thyroid. No discrete thyroid nodule is evident. Bilateral cervical adenopathy is similar to the prior studies. An anterior right level 2 lymph node measures 16 x 22 mm on the sagittal reformats. The anterior left level 2 lymph node measures 19 x 13 mm.  Degenerative changes of the cervical spine are most evident at C5-6 with uncovertebral and foraminal narrowing bilaterally.  IMPRESSION: 1. Extensive edematous changes throughout the oropharynx, hypopharynx, and larynx compatible with a diffuse pharyngitis. No discrete abscess is present. 2. Prominent swelling the epiglottis compatible with epiglottitis. 3. Extensive bilateral cervical adenopathy, most prominent level 1 L2 stations. 4. Increase in diffuse edema within the deep  cervical spaces and surrounding strap muscles and thyroid. No focal thyroid lesion is evident. 5. Focal narrowing of the airway within the hypopharynx secondary to extensive posterior pharyngeal edema.   Electronically Signed   By: Lawrence Santiago M.D.   On: 06/02/2013 20:20    EKG: Independently reviewed. Sinus tachycardia rate 102  Assessment/Plan  48 year old female history of coronary disease, diastolic heart failure, questionable syphilis of the experiencing neck lymphadenopathy for approximately 4 months. She was placed on doxycycline and has been on for approximately one month however this was discontinued 2 days ago due to neutropenia. Patient does have noted edema in her oropharynx as well as swelling of epiglottitis. She will be admitted to step down unit at [facility] per recommendations of ENT.  Neutropenia Likely secondary to doxycycline. Infectious disease, Dr. Baxter Flattery has been contacted by the ED physician and recommend placing patient on cefepime. Blood cultures currently in process. Currently patient is afebrile. UA is also negative.  Lymphadenopathy/pharyngitis Patient has had extensive workup in the past. She will be seen by ENT, Dr. Redmond Baseman. Patient will be undergoing scope this evening. CT of the neck shows extensive edematous changes throughout the oropharynx, hypopharynx, larynx compatible with diffuse pharyngitis, no abscess present, prominent swelling of epiglottitis, extensive bilateral cervical adenopathy with increase in diffuse edema and focal narrowing of the airway. Patient will be placed on cefepime as well as clindamycin ($RemoveBeforeD'600mg'[id number]$  IV q8h) and decadron ($RemoveBefor'20mg'PysLXHnOprdg$  IV daily) as recommended by ENT.  Polysubstance abuse including tobacco and cocaine Patient has been counseled. She states she has been tobacco free for approximately 3 weeks.  Asthma Will continue her albuterol inhaler.  Hypertension Currently stable. Continue clonidine, Lasix, amlodipine.  Coronary artery  disease Currently stable. Troponin negative.  Diastolic CHF Echocardiogram 07/19/2012 shows an EF of 65%, left ventricular diastolic dysfunction. Her BNP is approximately 500. Patient currently appears to be euvolemic and compensated. Will continue her Lasix. Will place her on fluid restriction, monitor input and output as well as daily weights.  Depression Continue bupropion, Celexa, trazodone.  DVT prophylaxis: Lovenox  Code Status: Full  Condition: Guarded  Family Communication: None at bedside. Admission, patients condition and plan of care including  tests being ordered have been discussed with the patient, who indicates understanding and agrees with the plan and Code Status.  Disposition Plan: Admitted   Time spent: 60 minutes  [place] Frieden D.O. [facility] Pager [phone]  If 7PM-7AM, please contact night-coverage [email or web address] Password [facility] 06/02/2013, 9:58 PM

## 2013-06-02 NOTE — ED Notes (Signed)
[facility] called for neb admin

## 2013-06-02 NOTE — Progress Notes (Signed)
ANTIBIOTIC CONSULT NOTE - INITIAL  Pharmacy Consult for:  Cefepime Indication: Febrile neutropenia  Allergies  Allergen Reactions  . Ciprofloxacin Itching  . Lipitor [Atorvastatin] Swelling    Patient Measurements: 05/30/13 - Height 66 inches  Weight 77.2 kg   Vital Signs: Temp: 102.5 F (39.2 C) (03/13 1927) Temp src: Oral (03/13 1927) BP: 105/72 mmHg (03/13 1927) Pulse Rate: 109 (03/13 1927)  Labs:  Recent Labs  06/02/13 1800  WBC 1.5*  HGB 8.5*  PLT 347  CREATININE 1.03   The estimated creatinine clearance is 69.4 ml/min using SCr 1.03 and the Cockcroft-Gault equation.    Microbiology: Recent Results (from the past 720 hour(s))  TECHNOLOGIST REVIEW     Status: None   Collection Time    05/23/13  9:38 AM      Result Value Ref Range Status   Technologist Review Variant lymphs present   Final    Medical History: Past Medical History  Diagnosis Date  . Coronary artery disease     Lexiscan myoview (10/12) with significant ST depression upon Lexiscan injection but no ischemia or infarction on perfusion images. Left heart cath (10/12): 30% mid LAD, 30% ostial D1, 50% ostial D2.    [name] Hypertension   . Tricuspid regurgitation   . Iron deficiency     hx of  . Polysubstance abuse     Prior cocaine  . Tobacco abuse   . Leukocytoclastic vasculitis     Rash across lower body, occurred in 9/11, diagnosed by skin biopsy. ANA positive. Thought to be secondary to cocaine use.  . Pulmonary HTN     Echo (9/11) with EF 65%, mild LVH, mild AI, mild MR, moderate to possibly severe TR with PA systolic pressure 52 mmHg. Echo (10/12): severe LV hypertrophy, EF 60-65%, mild MR, mild AI, moderate to severe tricuspid regurgitation, PA systolic pressure 38 mmHg.  Echo 4/14: moderate LVH, EF 65%, normal wall motion, diastolic dysfunction, mild AI, mild MR  . Asthma   . Shortness of breath     a. PFTs 5/14:  FEV1/FVC 99% predicted; FVC 60% predicted; DLCO mildly reduced, mild  restriction, air trapping.;  b.  seen by pulmo (Dr. [name]) 5/14: mainly upper airway symptoms - ACE d/c'd (sx's better)  . Chronic diastolic CHF (congestive heart failure)   . Constipation   . Lymphadenopathy 03/21/2013  . Leukopenia 03/21/2013  . Unspecified deficiency anemia 03/29/2013  . Anginal pain     none in past year  . Anxiety   . Mental disorder   . Depression   . Stroke 2010ish  . Arthritis   . Pancytopenia   . Hyperlipemia   . Syphilis 04/25/2013    Assessment:  Asked to assist with Cefepime therapy for this 48 year-old female with febrile neutropenia.  Referred to Hematology due to severe pancytopenia, lymphadenopathy, and abnormal biopsy.    Worsening pancytopenia is thought to be related to taking doxycycline, which has been discontinued.   Goal of Therapy:  Eradication of infection  Plan:  Cefepime 2 grams IV every 8 hours.  [place]. 06/02/2013 8:24 PM

## 2013-06-02 NOTE — Consult Note (Signed)
Reason for Consult:Throat swelling Referring Physician: ER  Morgan Bean is an 48 y.o. female.  HPI: 48 year old female with many month history of cervical lymphadenopathy s/p lymph node excisional biopsy showing benign, reactive node.  Extensive testing for infectious causes has been largely unrevealing except for a positive RPR.  She was treated with doxycycline but her baseline neutropenia worsened and she stopped the medicine.  She comes to the ER tonight with a few days of worsening throat swelling and sore throat.  She is has not been able to swallow for a couple of days.  She has had fever as well.  She has not had any trouble breathing.  She is being admitted for neutropenic fever and throat swelling to be treated with broad spectrum antibiotics.  Past Medical History  Diagnosis Date  . Coronary artery disease     Lexiscan myoview (10/12) with significant ST depression upon Lexiscan injection but no ischemia or infarction on perfusion images. Left heart cath (10/12): 30% mid LAD, 30% ostial D1, 50% ostial D2.    [name] Hypertension   . Tricuspid regurgitation   . Iron deficiency     hx of  . Polysubstance abuse     Prior cocaine  . Tobacco abuse   . Leukocytoclastic vasculitis     Rash across lower body, occurred in 9/11, diagnosed by skin biopsy. ANA positive. Thought to be secondary to cocaine use.  . Pulmonary HTN     Echo (9/11) with EF 65%, mild LVH, mild AI, mild MR, moderate to possibly severe TR with PA systolic pressure 52 mmHg. Echo (10/12): severe LV hypertrophy, EF 60-65%, mild MR, mild AI, moderate to severe tricuspid regurgitation, PA systolic pressure 38 mmHg.  Echo 4/14: moderate LVH, EF 65%, normal wall motion, diastolic dysfunction, mild AI, mild MR  . Asthma   . Shortness of breath     a. PFTs 5/14:  FEV1/FVC 99% predicted; FVC 60% predicted; DLCO mildly reduced, mild restriction, air trapping.;  b.  seen by pulmo (Dr. [name]) 5/14: mainly upper airway symptoms - ACE  d/c'd (sx's better)  . Chronic diastolic CHF (congestive heart failure)   . Constipation   . Lymphadenopathy 03/21/2013  . Leukopenia 03/21/2013  . Unspecified deficiency anemia 03/29/2013  . Anginal pain     none in past year  . Anxiety   . Mental disorder   . Depression   . Stroke 2010ish  . Arthritis   . Pancytopenia   . Hyperlipemia   . Syphilis 04/25/2013    Past Surgical History  Procedure Laterality Date  . Ankle surgery      right  . Cardiac catheterization    . I&d extremity  08/09/2011    Procedure: IRRIGATION AND DEBRIDEMENT EXTREMITY;  Surgeon: [name], MD;  Location: [facility];  Service: General;  Laterality: Left;  i & D Left axilla abscess  . Lymph node biopsy N/A 04/05/2013    Procedure: EXCISIONAL BIOPSY RIGHT SUBMANDIBULAR NODE, NASAL ENDOSCOPIC WITH BIOPSY NASAL PHARYNX;  Surgeon: [name], MD;  Location: [facility] OR;  Service: ENT;  Laterality: N/A;    Family History  Problem Relation Age of Onset  . Coronary artery disease Mother   . Diabetes Mother   . Breast cancer Maternal Grandmother   . Diabetes Maternal Grandmother   . Diabetes Father   . Hypertension Father   . Coronary artery disease Father     Social History:  reports that she quit smoking 10 days ago.  Her smoking use included Cigarettes. She has a 7.5 pack-year smoking history. She has never used smokeless tobacco. She reports that she uses illicit drugs ("Crack" cocaine) about once per week. She reports that she does not drink alcohol.  Allergies:  Allergies  Allergen Reactions  . Ciprofloxacin Itching  . Lipitor [Atorvastatin] Swelling    Medications: I have reviewed the patient's current medications.  Results for orders placed during the hospital encounter of 06/02/13 (from the past 48 hour(s))  CBC WITH DIFFERENTIAL     Status: Abnormal   Collection Time    06/02/13  6:00 PM      Result Value Ref Range   WBC 1.5 (*) 4.0 - 10.5 K/uL   RBC 3.61 (*) 3.87 - 5.11 MIL/uL   Hemoglobin  8.5 (*) 12.0 - 15.0 g/dL   HCT 26.7 (*) 36.0 - 46.0 %   MCV 74.0 (*) 78.0 - 100.0 fL   MCH 23.5 (*) 26.0 - 34.0 pg   MCHC 31.8  30.0 - 36.0 g/dL   RDW 16.8 (*) 11.5 - 15.5 %   Platelets 347  150 - 400 K/uL   Neutrophils Relative % 1 (*) 43 - 77 %   Lymphocytes Relative 85 (*) 12 - 46 %   Monocytes Relative 14 (*) 3 - 12 %   Eosinophils Relative 0  0 - 5 %   Basophils Relative 0  0 - 1 %   Neutro Abs 0.0 (*) 1.7 - 7.7 K/uL   Lymphs Abs 1.3  0.7 - 4.0 K/uL   Monocytes Absolute 0.2  0.1 - 1.0 K/uL   Eosinophils Absolute 0.0  0.0 - 0.7 K/uL   Basophils Absolute 0.0  0.0 - 0.1 K/uL   WBC Morphology WHITE COUNT CONFIRMED ON SMEAR    BASIC METABOLIC PANEL     Status: Abnormal   Collection Time    06/02/13  6:00 PM      Result Value Ref Range   Sodium 132 (*) 137 - 147 mEq/L   Potassium 3.8  3.7 - 5.3 mEq/L   Chloride 95 (*) 96 - 112 mEq/L   CO2 22  19 - 32 mEq/L   Glucose, Bld 100 (*) 70 - 99 mg/dL   BUN 9  6 - 23 mg/dL   Creatinine, Ser 1.03  0.50 - 1.10 mg/dL   Calcium 9.4  8.4 - 10.5 mg/dL   GFR calc non Af Amer 63 (*) >90 mL/min   GFR calc Af Amer 73 (*) >90 mL/min   Comment: (NOTE)     The eGFR has been calculated using the CKD EPI equation.     This calculation has not been validated in all clinical situations.     eGFR's persistently <90 mL/min signify possible Chronic Kidney     Disease.  PRO B NATRIURETIC PEPTIDE     Status: Abnormal   Collection Time    06/02/13  6:00 PM      Result Value Ref Range   Pro B Natriuretic peptide (BNP) 500.8 (*) 0 - 125 pg/mL  TROPONIN I     Status: None   Collection Time    06/02/13  6:00 PM      Result Value Ref Range   Troponin I <0.30  <0.30 ng/mL   Comment:            Due to the release kinetics of cTnI,     a negative result within the first hours     of the onset  of symptoms does not rule out     myocardial infarction with certainty.     If myocardial infarction is still suspected,     repeat the test at appropriate  intervals.  URINALYSIS, ROUTINE W REFLEX MICROSCOPIC     Status: Abnormal   Collection Time    06/02/13  9:10 PM      Result Value Ref Range   Color, Urine YELLOW  YELLOW   APPearance CLOUDY (*) CLEAR   Specific Gravity, Urine >1.046 (*) 1.005 - 1.030   pH 5.0  5.0 - 8.0   Glucose, UA NEGATIVE  NEGATIVE mg/dL   Hgb urine dipstick MODERATE (*) NEGATIVE   Bilirubin Urine NEGATIVE  NEGATIVE   Ketones, ur 15 (*) NEGATIVE mg/dL   Protein, ur 30 (*) NEGATIVE mg/dL   Urobilinogen, UA 0.2  0.0 - 1.0 mg/dL   Nitrite NEGATIVE  NEGATIVE   Leukocytes, UA NEGATIVE  NEGATIVE  URINE MICROSCOPIC-ADD ON     Status: Abnormal   Collection Time    06/02/13  9:10 PM      Result Value Ref Range   Squamous Epithelial / LPF FEW (*) RARE   WBC, UA 3-6  <3 WBC/hpf   RBC / HPF 7-10  <3 RBC/hpf   Bacteria, UA FEW (*) RARE   Urine-Other MUCOUS PRESENT     Comment: AMORPHOUS URATES/PHOSPHATES    Dg Neck Soft Tissue  06/02/2013   CLINICAL DATA:  Sore throat  EXAM: NECK SOFT TISSUES - 1+ VIEW  COMPARISON:  02/15/2013  FINDINGS: No acute bony abnormality is seen. There is some steepling of the subglottic airway and mild narrowing in the AP dimension on the lateral projection. Some soft tissue fullness is noted just above the thyroid cartilage. This has increased somewhat in the interval from the prior CT examination. No acute bony abnormality is seen. Some chronic calcifications are noted.  IMPRESSION: Soft tissue fullness just above the thyroid cartilage in the proximal trachea/subglottic airway. This has increased from the prior CT examination. Direct visualization may be helpful.  The epiglottis is stable in appearance from the prior exam.   Electronically Signed   By: Inez Catalina M.D.   On: 06/02/2013 19:00   Dg Chest 2 View  06/02/2013   CLINICAL DATA:  Shortness of breath  EXAM: CHEST  2 VIEW  COMPARISON:  05/04/2013  FINDINGS: Cardiac shadow is within normal limits. Lungs are clear bilaterally. No sizable  effusion is noted. No bony abnormality is seen.  IMPRESSION: No acute abnormality noted.   Electronically Signed   By: Inez Catalina M.D.   On: 06/02/2013 18:56   Ct Soft Tissue Neck W Contrast  06/02/2013   CLINICAL DATA:  Neck swelling and pain.  Next item  EXAM: CT NECK WITH CONTRAST  TECHNIQUE: Multidetector CT imaging of the neck was performed using the standard protocol following the bolus administration of intravenous contrast.  CONTRAST:  55m OMNIPAQUE IOHEXOL 300 MG/ML  SOLN  COMPARISON:  DG NECK SOFT TISSUE dated 06/02/2013; CT NECK W/CM dated 02/15/2013; NM PET IMAGE INITIAL (PI) SKULL BASE TO THIGH dated 03/29/2013  FINDINGS: Extensive mucosal thickening is present throughout the oropharynx hypopharynx and larynx. Prominent posterior mucosal thickening within the hypopharynx narrows the airway. The epiglottis is inflamed. There is marked enhancement of the palatine tonsils bilaterally without abscess. Edematous changes are present in the parapharyngeal space bilaterally.  Multiple enlarged hyper enhancing nodes are present bilaterally. There are enlarged nodes in the submandibular space bilaterally, measuring  up to 17 x 13 mm on the right and 13 x 20 mm on the left. An enlarged right submental lymph node is stable at 16 x 12 mm. There is increased edematous changes throughout the anterior neck surrounding the strap muscles and thyroid. No discrete thyroid nodule is evident. Bilateral cervical adenopathy is similar to the prior studies. An anterior right level 2 lymph node measures 16 x 22 mm on the sagittal reformats. The anterior left level 2 lymph node measures 19 x 13 mm.  Degenerative changes of the cervical spine are most evident at C5-6 with uncovertebral and foraminal narrowing bilaterally.  IMPRESSION: 1. Extensive edematous changes throughout the oropharynx, hypopharynx, and larynx compatible with a diffuse pharyngitis. No discrete abscess is present. 2. Prominent swelling the epiglottis  compatible with epiglottitis. 3. Extensive bilateral cervical adenopathy, most prominent level 1 L2 stations. 4. Increase in diffuse edema within the deep cervical spaces and surrounding strap muscles and thyroid. No focal thyroid lesion is evident. 5. Focal narrowing of the airway within the hypopharynx secondary to extensive posterior pharyngeal edema.   Electronically Signed   By: Lawrence Santiago M.D.   On: 06/02/2013 20:20    Review of Systems  Constitutional: Positive for fever.  HENT: Positive for sore throat.   All other systems reviewed and are negative.   Blood pressure 115/67, pulse 103, temperature 98.1 F (36.7 C), temperature source Oral, resp. rate 16, height _0  (1.676 m), weight 77.202 kg (170 lb 3.2 oz), last menstrual period 04/04/2013, SpO2 99.00%. Physical Exam  Constitutional: She is oriented to person, place, and time. She appears well-developed and well-nourished. No distress.  HENT:  Head: Normocephalic and atraumatic.  Right Ear: External ear normal.  Left Ear: External ear normal.  Nose: Nose normal.  Mouth/Throat: Oropharynx is clear and moist.  Mild hoarseness.  No stridor or respiratory distress.  Eyes: Conjunctivae and EOM are normal. Pupils are equal, round, and reactive to light.  Neck: Normal range of motion. Neck supple.  Bilateral enlarged level 2 nodes.  Larynx tender.  Right anterior neck incisional scar.  Cardiovascular: Normal rate.   Respiratory: Effort normal.  Musculoskeletal: Normal range of motion.  Neurological: She is alert and oriented to person, place, and time. No cranial nerve deficit.  Skin: Skin is warm and dry.  Psychiatric: She has a normal mood and affect. Her behavior is normal. Judgment and thought content normal.    Assessment/Plan: Acute supraglottitis, neutropenia I personally reviewed her neck CT showing inflammatory edema of supraglottic structures and pharyngeal structures.  This is somewhat worse than findings on a  November CT.  In order to evaluate the pharynx and larynx further, fiberoptic exam was performed.  See procedure note.  There is marked edema of the arytenoids and a thickened, reddened epiglottis.  The glottis is spared.  I agree with admission for broad spectrum antibiotics.  I recommended a dose of Decadron and agree with step-down admission.  Even though there is significant laryngeal edema, she is not having any trouble breathing and progress has been gradual.  I do not feel that intubation is needed at present but will be considered with any change in breathing status.  Awake tracheostomy may be the approach of choice.  Will follow.  Vaneta Hammontree 06/02/2013, 10:08 PM

## 2013-06-02 NOTE — ED Notes (Signed)
Pt. Made aware for the need of urine. 

## 2013-06-02 NOTE — ED Notes (Signed)
Pt has had neck swelling for 3 months.  Pt has had throat lump biopsy done.  Pt states she was on antibiotics.  Pt is seeing specialist on Wed but is having difficulty swallowing and hard to catch her breath.

## 2013-06-02 NOTE — ED Notes (Signed)
ENT MD at bedside.

## 2013-06-02 NOTE — Procedures (Signed)
Preop diagnosis: Acute supraglottitis Postop diagonsis: Same Procedure: Transnasal fiberoptic laryngoscopy Surgeon: Redmond Baseman Anesth: Topical 2% lidocaine gel Compl: None Findings: The epiglottis is thickened and reddened.  The arytenoids are edematous and overhang the glottis. The true vocal folds appear to be spared of edema but are a bit difficult to see.  Secretions are difficult for her to clear. Description: After discussing risks, benefits, and alternatives, the fiberoptic laryngoscope was coated with lidocaine gel and then passed through the left nasal passage to view the pharynx and larynx.  The scope was removed after use.  She tolerated the procedure well and was then returned to nursing care in stable condition.

## 2013-06-03 DIAGNOSIS — A539 Syphilis, unspecified: Secondary | ICD-10-CM

## 2013-06-03 DIAGNOSIS — J0510 Acute epiglottitis without obstruction: Secondary | ICD-10-CM

## 2013-06-03 LAB — BASIC METABOLIC PANEL
BUN: 9 mg/dL (ref 6–23)
CHLORIDE: 97 meq/L (ref 96–112)
CO2: 23 mEq/L (ref 19–32)
Calcium: 9.1 mg/dL (ref 8.4–10.5)
Creatinine, Ser: 0.93 mg/dL (ref 0.50–1.10)
GFR calc non Af Amer: 71 mL/min — ABNORMAL LOW (ref >90)
GFR, EST AFRICAN AMERICAN: 82 mL/min — AB (ref >90)
Glucose, Bld: 106 mg/dL — ABNORMAL HIGH (ref 70–99)
Potassium: 3.3 mEq/L — ABNORMAL LOW (ref 3.7–5.3)
Sodium: 133 mEq/L — ABNORMAL LOW (ref 137–147)

## 2013-06-03 LAB — DIFFERENTIAL
BLASTS: 0 %
Band Neutrophils: 1 % (ref 0–10)
Basophils Relative: 0 % (ref 0–1)
EOS PCT: 0 % (ref 0–5)
Lymphocytes Relative: 81 % — ABNORMAL HIGH (ref 12–46)
METAMYELOCYTES PCT: 0 %
Monocytes Relative: 17 % — ABNORMAL HIGH (ref 3–12)
Myelocytes: 0 %
NEUTROS PCT: 1 % — AB (ref 43–77)
NRBC: 0 /100{WBCs}
Promyelocytes Absolute: 0 %

## 2013-06-03 LAB — CBC
HCT: 26.5 % — ABNORMAL LOW (ref 36.0–46.0)
HEMOGLOBIN: 8.3 g/dL — AB (ref 12.0–15.0)
MCH: 23.6 pg — ABNORMAL LOW (ref 26.0–34.0)
MCHC: 31.3 g/dL (ref 30.0–36.0)
MCV: 75.3 fL — ABNORMAL LOW (ref 78.0–100.0)
Platelets: 297 10*3/uL (ref 150–400)
RBC: 3.52 MIL/uL — ABNORMAL LOW (ref 3.87–5.11)
RDW: 17.1 % — ABNORMAL HIGH (ref 11.5–15.5)
WBC: 1.6 10*3/uL — AB (ref 4.0–10.5)

## 2013-06-03 LAB — MRSA PCR SCREENING: MRSA by PCR: NEGATIVE

## 2013-06-03 MED ORDER — BUPROPION HCL 100 MG PO TABS
100.0000 mg | ORAL_TABLET | Freq: Two times a day (BID) | ORAL | Status: DC
  Filled 2013-06-03: qty 1

## 2013-06-03 MED ORDER — POTASSIUM CHLORIDE CRYS ER 10 MEQ PO TBCR
10.0000 meq | EXTENDED_RELEASE_TABLET | Freq: Two times a day (BID) | ORAL | Status: DC
  Filled 2013-06-03: qty 1

## 2013-06-03 MED ORDER — LORATADINE 10 MG PO TABS: 10.0000 mg | ORAL_TABLET | Freq: Every day | ORAL | Status: DC

## 2013-06-03 MED ORDER — CLINDAMYCIN PHOSPHATE 600 MG/50ML IV SOLN
600.0000 mg | Freq: Three times a day (TID) | INTRAVENOUS | Status: DC
  Administered 2013-06-03 – 2013-06-08 (×16): 600 mg via INTRAVENOUS
  Filled 2013-06-03 (×19): qty 50

## 2013-06-03 MED ORDER — ALBUTEROL SULFATE (2.5 MG/3ML) 0.083% IN NEBU: 3.0000 mL | INHALATION_SOLUTION | RESPIRATORY_TRACT | Status: DC | PRN

## 2013-06-03 MED ORDER — CLONIDINE HCL 0.3 MG PO TABS
0.3000 mg | ORAL_TABLET | Freq: Two times a day (BID) | ORAL | Status: DC
  Filled 2013-06-03: qty 1

## 2013-06-03 MED ORDER — DEXAMETHASONE SODIUM PHOSPHATE 4 MG/ML IJ SOLN
20.0000 mg | INTRAMUSCULAR | Status: DC
  Administered 2013-06-03: 20 mg via INTRAVENOUS
  Filled 2013-06-03 (×2): qty 5

## 2013-06-03 MED ORDER — ADULT MULTIVITAMIN W/MINERALS CH: 1.0000 | ORAL_TABLET | Freq: Every day | ORAL | Status: DC

## 2013-06-03 MED ORDER — FUROSEMIDE 20 MG PO TABS
20.0000 mg | ORAL_TABLET | Freq: Two times a day (BID) | ORAL | Status: DC
Start: 2013-06-03 — End: 2013-06-03
  Filled 2013-06-03: qty 1

## 2013-06-03 MED ORDER — TRAZODONE HCL 100 MG PO TABS
100.0000 mg | ORAL_TABLET | Freq: Every day | ORAL | Status: DC
  Filled 2013-06-03: qty 1

## 2013-06-03 MED ORDER — HYDROXYZINE HCL 25 MG PO TABS
25.0000 mg | ORAL_TABLET | Freq: Every evening | ORAL | Status: DC | PRN
  Filled 2013-06-03: qty 1

## 2013-06-03 MED ORDER — MORPHINE SULFATE 2 MG/ML IJ SOLN
2.0000 mg | INTRAMUSCULAR | Status: DC | PRN
  Administered 2013-06-07 – 2013-06-08 (×3): 2 mg via INTRAVENOUS
  Filled 2013-06-03 (×4): qty 1

## 2013-06-03 MED ORDER — HYDRALAZINE HCL 20 MG/ML IJ SOLN
10.0000 mg | Freq: Four times a day (QID) | INTRAMUSCULAR | Status: DC | PRN
  Filled 2013-06-03: qty 1

## 2013-06-03 MED ORDER — POLYETHYLENE GLYCOL 3350 17 G PO PACK
17.0000 g | PACK | Freq: Every day | ORAL | Status: DC | PRN
  Filled 2013-06-03: qty 1

## 2013-06-03 MED ORDER — ENOXAPARIN SODIUM 40 MG/0.4ML ~~LOC~~ SOLN
40.0000 mg | SUBCUTANEOUS | Status: DC
  Administered 2013-06-03 – 2013-06-06 (×4): 40 mg via SUBCUTANEOUS
  Filled 2013-06-03 (×4): qty 0.4

## 2013-06-03 MED ORDER — TBO-FILGRASTIM 300 MCG/0.5ML ~~LOC~~ SOSY
300.0000 ug | PREFILLED_SYRINGE | Freq: Every day | SUBCUTANEOUS | Status: DC
  Administered 2013-06-03 – 2013-06-06 (×4): 300 ug via SUBCUTANEOUS
  Filled 2013-06-03 (×5): qty 0.5

## 2013-06-03 MED ORDER — ASPIRIN 81 MG PO CHEW
81.0000 mg | CHEWABLE_TABLET | Freq: Every day | ORAL | Status: DC
Start: 2013-06-03 — End: 2013-06-03

## 2013-06-03 MED ORDER — CITALOPRAM HYDROBROMIDE 40 MG PO TABS
40.0000 mg | ORAL_TABLET | Freq: Every day | ORAL | Status: DC
Start: 2013-06-03 — End: 2013-06-03

## 2013-06-03 MED ORDER — ASPIRIN 300 MG RE SUPP
300.0000 mg | Freq: Every day | RECTAL | Status: DC
  Administered 2013-06-03 – 2013-06-06 (×3): 300 mg via RECTAL
  Filled 2013-06-03 (×5): qty 1

## 2013-06-03 MED ORDER — ACETAMINOPHEN 325 MG PO TABS: 650.0000 mg | ORAL_TABLET | Freq: Four times a day (QID) | ORAL | Status: DC | PRN

## 2013-06-03 MED ORDER — AMLODIPINE BESYLATE 10 MG PO TABS: 10.0000 mg | ORAL_TABLET | Freq: Every day | ORAL | Status: DC

## 2013-06-03 MED ORDER — FUROSEMIDE 10 MG/ML IJ SOLN
20.0000 mg | Freq: Every day | INTRAMUSCULAR | Status: DC
  Administered 2013-06-03 – 2013-06-06 (×4): 20 mg via INTRAVENOUS
  Filled 2013-06-03 (×6): qty 2

## 2013-06-03 MED ORDER — TBO-FILGRASTIM 300 MCG/0.5ML ~~LOC~~ SOSY
300.0000 ug | PREFILLED_SYRINGE | Freq: Once | SUBCUTANEOUS | Status: DC
  Filled 2013-06-03: qty 0.5

## 2013-06-03 MED ORDER — ACETAMINOPHEN 650 MG RE SUPP: 650.0000 mg | Freq: Four times a day (QID) | RECTAL | Status: DC | PRN

## 2013-06-03 MED ORDER — DEXAMETHASONE SODIUM PHOSPHATE 10 MG/ML IJ SOLN
20.0000 mg | INTRAMUSCULAR | Status: DC
  Administered 2013-06-04 – 2013-06-09 (×6): 20 mg via INTRAVENOUS
  Filled 2013-06-03 (×7): qty 2

## 2013-06-03 MED ORDER — ALBUTEROL SULFATE (2.5 MG/3ML) 0.083% IN NEBU: 2.5000 mg | INHALATION_SOLUTION | RESPIRATORY_TRACT | Status: DC | PRN

## 2013-06-03 NOTE — Progress Notes (Signed)
[facility] PROGRESS NOTE  Morgan Bean YPP:[id number] DOB: [birth year] DOA: 06/02/2013 PCP: Morgan Chessman, MD  Assessment/Plan: 1. Acute supraglottitis, status post transnasal fiberoptic laryngoscopy, procedure performed on 06/02/2013 by Morgan Bean. Patient was started on broad-spectrum IV antimicrobial therapy, showing some clinical improvement on 06/03/2013, with resolution to her airway symptoms. She was transitioned to liquid diet today. 2. Neutropenia. Patient with history of neutropenia who has been seen by hematology at the [facility] . She had a bone marrow biopsy performed in January of 2015 which was unrevealing. Case discussed with hematology as recommended by infectious disease. Morgan Bean feel it would be reasonable to start Neulasta 300 mcg subcutaneous daily. 3. History of syphilis. Infectious disease consulted. It's possible that doxycycline may have lead to worsening neutropenia. This agent was discontinued on admission. 4. History of hypertension. Her blood pressures are stable with systolic blood pressures in the 120s. She remains on Lasix 20 mg daily.  Code Status: Full Code Family Communication: Disposition Plan: Continue antibiotics, follow up on ID's recommendations   Consultants:  Infectious disease  ENT  Telephone consultation made to hematology  Procedures:  Transnasal fiberoptic laryngoscopy performed on 06/02/2048  Antibiotics:  Cefepime 2 g  IV every 8 hours  Clindamycin 600 mg every 8 hours  Vancomycin  HPI/Subjective: Patient is a pleasant 48 year old female with a past medical history of pancytopenia who is being evaluated at the [facility], undergoing bone marrow biopsy which came back negative. She was recently diagnosed with syphilis and started on doxycycline therapy. Patient was admitted to the medicine service on 06/02/2013 presenting with neck swelling and sore throat. A CT scan of the neck performed on 06/02/2013 revealed  extensive edematous changes throughout the oropharynx, hypopharynx and larynx compatible with diffuse pharyngitis. There is also prominent swelling of the epiglottis. Labs revealed the presence of neutropenia as she was started on broad-spectrum empiric IV antibiotic therapy with cefepime 2 g IV every 8 hours clindamycin 600 mg IV every 8 hours and vancomycin IV. Patient was seen and evaluated by ENT as if she underwent transnasal fiberoptic lower endoscopy on 06/02/2013 which showed thickened epiglottis with arytenoids edematous and overhanging the glottis. She reported feeling some improvement on the following day as her diet was advanced to clears. ID recommended hematology consult for role of GCSF in this setting. I discussed case with Morgan Bean who felt it would be reasonable to treat with Neulasta 300 mcg daily.    Objective: Filed Vitals:   06/03/13 1200  BP:   Pulse:   Temp: 98.1 F (36.7 C)  Resp:     Intake/Output Summary (Last 24 hours) at 06/03/13 1459 Last data filed at 06/03/13 0558  Gross per 24 hour  Intake    100 ml  Output      0 ml  Net    100 ml   Filed Weights   06/02/13 1927 06/03/13 0025  Weight: 77.202 kg (170 lb 3.2 oz) 75.7 kg (166 lb 14.2 oz)    Exam:   General:  Patient does not appear to be in acute distress, she seems to be breathing comfortably, I did not note obvious stridor or use of accessory muscles.  Cardiovascular: Regular rate rhythm  Respiratory: Coarse respiratory sounds, having normal respiratory effort  Abdomen: Soft nontender nondistended  Musculoskeletal: No edema  Data Reviewed: Basic Metabolic Panel:  Recent Labs Lab 05/30/13 1236 06/02/13 1800 06/03/13 0338  NA 138 132* 133*  K 4.1 3.8 3.3*  CL  --  95*  97  CO2 _0 GLUCOSE 99 100* 106*  BUN 13._1 CREATININE 1.2* 1.03 0.93  CALCIUM 9.9 9.4 9.1   Liver Function Tests:  Recent Labs Lab 05/30/13 1236  AST 8  ALT <6  ALKPHOS 73  BILITOT 0.27  PROT 9.3*   ALBUMIN 2.8*   No results found for this basename: LIPASE, AMYLASE,  in the last 168 hours No results found for this basename: AMMONIA,  in the last 168 hours CBC:  Recent Labs Lab 05/30/13 1235 06/02/13 1800 06/03/13 0338  WBC 2.1* 1.5* 1.6*  NEUTROABS  --  0.0*  --   HGB 8.6* 8.5* 8.3*  HCT 27.6* 26.7* 26.5*  MCV 74.8* 74.0* 75.3*  PLT 331 347 297   Cardiac Enzymes:  Recent Labs Lab 06/02/13 1800  TROPONINI <0.30   BNP (last 3 results)  Recent Labs  04/16/13 1616 05/02/13 1010 06/02/13 1800  PROBNP 415.6* 132.0* 500.8*   CBG: No results found for this basename: GLUCAP,  in the last 168 hours  Recent Results (from the past 240 hour(s))  AFB CULTURE, BLOOD     Status: None   Collection Time    06/02/13  9:04 PM      Result Value Ref Range Status   Specimen Description BLOOD RIGHT ARM   Final   Special Requests NONE   Final   Culture     Final   Value: CULTURE WILL BE EXAMINED FOR 6 WEEKS BEFORE ISSUING A FINAL REPORT     Performed at [facility]   Report Status PENDING   Incomplete  MRSA PCR SCREENING     Status: None   Collection Time    06/03/13  3:45 AM      Result Value Ref Range Status   MRSA by PCR NEGATIVE  NEGATIVE Final   Comment:            The GeneXpert MRSA Assay (FDA     approved for NASAL specimens     only), is one component of a     comprehensive MRSA colonization     surveillance program. It is not     intended to diagnose MRSA     infection nor to guide or     monitor treatment for     MRSA infections.     Studies: Dg Neck Soft Tissue  06/02/2013   CLINICAL DATA:  Sore throat  EXAM: NECK SOFT TISSUES - 1+ VIEW  COMPARISON:  02/15/2013  FINDINGS: No acute bony abnormality is seen. There is some steepling of the subglottic airway and mild narrowing in the AP dimension on the lateral projection. Some soft tissue fullness is noted just above the thyroid cartilage. This has increased somewhat in the interval from the prior CT  examination. No acute bony abnormality is seen. Some chronic calcifications are noted.  IMPRESSION: Soft tissue fullness just above the thyroid cartilage in the proximal trachea/subglottic airway. This has increased from the prior CT examination. Direct visualization may be helpful.  The epiglottis is stable in appearance from the prior exam.   Electronically Signed   By: Inez Catalina M.D.   On: 06/02/2013 19:00   Dg Chest 2 View  06/02/2013   CLINICAL DATA:  Shortness of breath  EXAM: CHEST  2 VIEW  COMPARISON:  05/04/2013  FINDINGS: Cardiac shadow is within normal limits. Lungs are clear bilaterally. No sizable effusion is noted. No bony abnormality is seen.  IMPRESSION: No acute abnormality  noted.   Electronically Signed   By: Inez Catalina M.D.   On: 06/02/2013 18:56   Ct Soft Tissue Neck W Contrast  06/02/2013   CLINICAL DATA:  Neck swelling and pain.  Next item  EXAM: CT NECK WITH CONTRAST  TECHNIQUE: Multidetector CT imaging of the neck was performed using the standard protocol following the bolus administration of intravenous contrast.  CONTRAST:  39m OMNIPAQUE IOHEXOL 300 MG/ML  SOLN  COMPARISON:  DG NECK SOFT TISSUE dated 06/02/2013; CT NECK W/CM dated 02/15/2013; NM PET IMAGE INITIAL (PI) SKULL BASE TO THIGH dated 03/29/2013  FINDINGS: Extensive mucosal thickening is present throughout the oropharynx hypopharynx and larynx. Prominent posterior mucosal thickening within the hypopharynx narrows the airway. The epiglottis is inflamed. There is marked enhancement of the palatine tonsils bilaterally without abscess. Edematous changes are present in the parapharyngeal space bilaterally.  Multiple enlarged hyper enhancing nodes are present bilaterally. There are enlarged nodes in the submandibular space bilaterally, measuring up to 17 x 13 mm on the right and 13 x 20 mm on the left. An enlarged right submental lymph node is stable at 16 x 12 mm. There is increased edematous changes throughout the anterior  neck surrounding the strap muscles and thyroid. No discrete thyroid nodule is evident. Bilateral cervical adenopathy is similar to the prior studies. An anterior right level 2 lymph node measures 16 x 22 mm on the sagittal reformats. The anterior left level 2 lymph node measures 19 x 13 mm.  Degenerative changes of the cervical spine are most evident at C5-6 with uncovertebral and foraminal narrowing bilaterally.  IMPRESSION: 1. Extensive edematous changes throughout the oropharynx, hypopharynx, and larynx compatible with a diffuse pharyngitis. No discrete abscess is present. 2. Prominent swelling the epiglottis compatible with epiglottitis. 3. Extensive bilateral cervical adenopathy, most prominent level 1 L2 stations. 4. Increase in diffuse edema within the deep cervical spaces and surrounding strap muscles and thyroid. No focal thyroid lesion is evident. 5. Focal narrowing of the airway within the hypopharynx secondary to extensive posterior pharyngeal edema.   Electronically Signed   By: CLawrence SantiagoM.D.   On: 06/02/2013 20:20    Scheduled Meds: . aspirin  300 mg Rectal Daily  . ceFEPime (MAXIPIME) IV  2 g Intravenous Q8H  . clindamycin (CLEOCIN) IV  600 mg Intravenous 3 times per day  . [START ON 06/04/2013] dexamethasone  20 mg Intravenous Q24H  . enoxaparin (LOVENOX) injection  40 mg Subcutaneous Q24H  . furosemide  20 mg Intravenous Daily   Continuous Infusions:   Active Problems:   Lymphadenopathy   Neutropenic fever    Time spent: 35 min    ZKelvin Cellar [facility] Pager [phone] If 7PM-7AM, please contact night-coverage at [email or web address], password [email or web address]3/14/2015, 2:59 PM  LOS: 1 day

## 2013-06-03 NOTE — Progress Notes (Signed)
Subjective: Compared to last night, throat pain has improved from 9/10 to 5/10, her voice has improved a bit, she no longer has any difficulty breathing, but swallow remains difficult.  Objective: Vital signs in last 24 hours: Temp:  [98.1 F (36.7 C)-102.5 F (39.2 C)] 98.3 F (36.8 C) (03/14 0800) Pulse Rate:  [80-109] 90 (03/14 0805) Resp:  [13-23] 13 (03/14 0805) BP: (105-145)/(67-115) 127/80 mmHg (03/14 0805) SpO2:  [98 %-100 %] 100 % (03/14 0805) Weight:  [75.7 kg (166 lb 14.2 oz)-77.202 kg (170 lb 3.2 oz)] 75.7 kg (166 lb 14.2 oz) (03/14 0025)    Intake/Output from previous day: 03/13 0701 - 03/14 0700 In: 100 [IV Piggyback:100] Out: -  Intake/Output this shift:    General appearance: alert, cooperative and no distress Throat: voice remains raspy, no stridor  Lab Results:   Recent Labs  06/02/13 1800 06/03/13 0338  WBC 1.5* 1.6*  HGB 8.5* 8.3*  HCT 26.7* 26.5*  PLT 347 297   BMET  Recent Labs  06/02/13 1800 06/03/13 0338  NA 132* 133*  K 3.8 3.3*  CL 95* 97  CO2 22 23  GLUCOSE 100* 106*  BUN 9 9  CREATININE 1.03 0.93  CALCIUM 9.4 9.1   PT/INR No results found for this basename: LABPROT, INR,  in the last 72 hours ABG No results found for this basename: PHART, PCO2, PO2, HCO3,  in the last 72 hours  Studies/Results: Dg Neck Soft Tissue  06/02/2013   CLINICAL DATA:  Sore throat  EXAM: NECK SOFT TISSUES - 1+ VIEW  COMPARISON:  02/15/2013  FINDINGS: No acute bony abnormality is seen. There is some steepling of the subglottic airway and mild narrowing in the AP dimension on the lateral projection. Some soft tissue fullness is noted just above the thyroid cartilage. This has increased somewhat in the interval from the prior CT examination. No acute bony abnormality is seen. Some chronic calcifications are noted.  IMPRESSION: Soft tissue fullness just above the thyroid cartilage in the proximal trachea/subglottic airway. This has increased from the prior  CT examination. Direct visualization may be helpful.  The epiglottis is stable in appearance from the prior exam.   Electronically Signed   By: Inez Catalina M.D.   On: 06/02/2013 19:00   Dg Chest 2 View  06/02/2013   CLINICAL DATA:  Shortness of breath  EXAM: CHEST  2 VIEW  COMPARISON:  05/04/2013  FINDINGS: Cardiac shadow is within normal limits. Lungs are clear bilaterally. No sizable effusion is noted. No bony abnormality is seen.  IMPRESSION: No acute abnormality noted.   Electronically Signed   By: Inez Catalina M.D.   On: 06/02/2013 18:56   Ct Soft Tissue Neck W Contrast  06/02/2013   CLINICAL DATA:  Neck swelling and pain.  Next item  EXAM: CT NECK WITH CONTRAST  TECHNIQUE: Multidetector CT imaging of the neck was performed using the standard protocol following the bolus administration of intravenous contrast.  CONTRAST:  45mL OMNIPAQUE IOHEXOL 300 MG/ML  SOLN  COMPARISON:  DG NECK SOFT TISSUE dated 06/02/2013; CT NECK W/CM dated 02/15/2013; NM PET IMAGE INITIAL (PI) SKULL BASE TO THIGH dated 03/29/2013  FINDINGS: Extensive mucosal thickening is present throughout the oropharynx hypopharynx and larynx. Prominent posterior mucosal thickening within the hypopharynx narrows the airway. The epiglottis is inflamed. There is marked enhancement of the palatine tonsils bilaterally without abscess. Edematous changes are present in the parapharyngeal space bilaterally.  Multiple enlarged hyper enhancing nodes are present bilaterally. There  are enlarged nodes in the submandibular space bilaterally, measuring up to 17 x 13 mm on the right and 13 x 20 mm on the left. An enlarged right submental lymph node is stable at 16 x 12 mm. There is increased edematous changes throughout the anterior neck surrounding the strap muscles and thyroid. No discrete thyroid nodule is evident. Bilateral cervical adenopathy is similar to the prior studies. An anterior right level 2 lymph node measures 16 x 22 mm on the sagittal reformats.  The anterior left level 2 lymph node measures 19 x 13 mm.  Degenerative changes of the cervical spine are most evident at C5-6 with uncovertebral and foraminal narrowing bilaterally.  IMPRESSION: 1. Extensive edematous changes throughout the oropharynx, hypopharynx, and larynx compatible with a diffuse pharyngitis. No discrete abscess is present. 2. Prominent swelling the epiglottis compatible with epiglottitis. 3. Extensive bilateral cervical adenopathy, most prominent level 1 L2 stations. 4. Increase in diffuse edema within the deep cervical spaces and surrounding strap muscles and thyroid. No focal thyroid lesion is evident. 5. Focal narrowing of the airway within the hypopharynx secondary to extensive posterior pharyngeal edema.   Electronically Signed   By: Lawrence Santiago M.D.   On: 06/02/2013 20:20    Anti-infectives: Anti-infectives   Start     Dose/Rate Route Frequency Ordered Stop   06/03/13 0600  clindamycin (CLEOCIN) IVPB 600 mg     600 mg 100 mL/hr over 30 Minutes Intravenous 3 times per day 06/03/13 0036     06/03/13 0400  ceFEPIme (MAXIPIME) 2 g in dextrose 5 % 50 mL IVPB     2 g 100 mL/hr over 30 Minutes Intravenous Every 8 hours 06/02/13 2020     06/02/13 2045  clindamycin (CLEOCIN) IVPB 600 mg     600 mg 100 mL/hr over 30 Minutes Intravenous  Once 06/02/13 2038 06/02/13 2336   06/02/13 2015  ceFEPIme (MAXIPIME) 2 g in dextrose 5 % 50 mL IVPB     2 g 100 mL/hr over 30 Minutes Intravenous STAT 06/02/13 2012 06/02/13 2212      Assessment/Plan: Acute supraglottitis, neutropenia Improved airway symptoms compared to last night.  I do not feel that I need to look with the fiberoptic scope today.  Continue IV antibiotic therapy for supraglottitis until improvement remains consistent then may transition to oral.  Might be best to observe in step down for another night before transferring to regular room.  Will follow.  OK to start liquid diet.  LOS: 1 day    Derian Dimalanta,  Lucky Alverson 06/03/2013

## 2013-06-03 NOTE — Progress Notes (Signed)
48yo female presented to [facility] c/o neck swelling x32mo, had Bx done (benign) and was on ABX, seeing specialist next week but having difficult swallowing as well as SOB, noted w/ neutopenic fever, to begin broad-spectrum ABX.  Will start cefepime 2g IV Q8H for CrCl ~53ml/min and monitor CBC and Cx.  Wynona Neat, PharmD, BCPS 06/03/2013 12:40 AM

## 2013-06-03 NOTE — Progress Notes (Signed)
INITIAL NUTRITION ASSESSMENT  Pt meets criteria for severe MALNUTRITION in the context of chronic illness as evidenced by <75% estimated energy intake with 12% weight loss in the past 2 months.  DOCUMENTATION CODES Per approved criteria  -Severe malnutrition in the context of chronic illness   INTERVENTION: - Diet advancement per MD - Recommend Ensure Complete BID once diet advanced, as pt's swallowing function allows - Unit RD to continue to monitor   NUTRITION DIAGNOSIS: Inadequate oral intake related to inability to eat as evidenced by NPO.    Goal: Advance diet as tolerated to regular diet  Monitor:  Weights, labs, diet advancement, swallowing ability   Reason for Assessment: Malnutrition screening tool   48 y.o. female  Admitting Dx: Neck swelling   ASSESSMENT: Pt with history of coronary artery disease, diastolic CHF, lymphadenopathy that presents emergency department for neck swelling and sore throat. Patient has been experiencing this for approximately 4 months for which she had a workup. At one point patient was told she had syphilis. Patient complains of some shortness of breath however states this has been ongoing.  Met with pt who reports 37 pound unintended weight loss in the past 2 months, however past weight trend shows pt's weight down 23 pounds during this time frame. Pt denies any decreased muscle strength. Reports she's been trying to supplement her diet of 2 meals/day with 1-2 30g protein shakes/day recently to help prevent further weight loss. States she was swallowing fine until 2 days ago, she tried to drink some water today but choked on it.   Potassium low  Height: Ht Readings from Last 1 Encounters:  06/03/13 _0  (1.676 m)    Weight: Wt Readings from Last 1 Encounters:  06/03/13 166 lb 14.2 oz (75.7 kg)    Ideal Body Weight: 130 lb   % Ideal Body Weight: 128%  Wt Readings from Last 10 Encounters:  06/03/13 166 lb 14.2 oz (75.7 kg)   05/30/13 170 lb 3.2 oz (77.202 kg)  05/23/13 175 lb 1.6 oz (79.425 kg)  05/03/13 185 lb 9.6 oz (84.188 kg)  05/02/13 183 lb (83.008 kg)  04/25/13 188 lb 6.4 oz (85.458 kg)  04/20/13 186 lb 9.6 oz (84.641 kg)  04/11/13 189 lb 12.8 oz (86.093 kg)  03/29/13 189 lb 1.6 oz (85.775 kg)  03/21/13 187 lb 3.2 oz (84.913 kg)    Usual Body Weight: 203 lb per pt  % Usual Body Weight: 82%  BMI:  Body mass index is 26.95 kg/(m^2).  Estimated Nutritional Needs: Kcal: 1500-1700 Protein: 75-90g Fluid: 1.5-1.7L/day  Skin: Neck swelling   Diet Order: NPO  EDUCATION NEEDS: -No education needs identified at this time   Intake/Output Summary (Last 24 hours) at 06/03/13 1548 Last data filed at 06/03/13 0558  Gross per 24 hour  Intake    100 ml  Output      0 ml  Net    100 ml    Last BM: PTA  Labs:   Recent Labs Lab 05/30/13 1236 06/02/13 1800 06/03/13 0338  NA 138 132* 133*  K 4.1 3.8 3.3*  CL  --  95* 97  CO2 _1 BUN 13._2 CREATININE 1.2* 1.03 0.93  CALCIUM 9.9 9.4 9.1  GLUCOSE 99 100* 106*    CBG (last 3)  No results found for this basename: GLUCAP,  in the last 72 hours  Scheduled Meds: . aspirin  300 mg Rectal Daily  . ceFEPime (MAXIPIME) IV  2 g Intravenous Q8H  . clindamycin (CLEOCIN) IV  600 mg Intravenous 3 times per day  . [START ON 06/04/2013] dexamethasone  20 mg Intravenous Q24H  . enoxaparin (LOVENOX) injection  40 mg Subcutaneous Q24H  . furosemide  20 mg Intravenous Daily  . Tbo-Filgrastim  300 mcg Subcutaneous Daily    Continuous Infusions:   Past Medical History  Diagnosis Date  . Coronary artery disease     Lexiscan myoview (10/12) with significant ST depression upon Lexiscan injection but no ischemia or infarction on perfusion images. Left heart cath (10/12): 30% mid LAD, 30% ostial D1, 50% ostial D2.    [name] Hypertension   . Tricuspid regurgitation   . Iron deficiency     hx of  . Polysubstance abuse     Prior cocaine  . Tobacco  abuse   . Leukocytoclastic vasculitis     Rash across lower body, occurred in 9/11, diagnosed by skin biopsy. ANA positive. Thought to be secondary to cocaine use.  . Pulmonary HTN     Echo (9/11) with EF 65%, mild LVH, mild AI, mild MR, moderate to possibly severe TR with PA systolic pressure 52 mmHg. Echo (10/12): severe LV hypertrophy, EF 60-65%, mild MR, mild AI, moderate to severe tricuspid regurgitation, PA systolic pressure 38 mmHg.  Echo 4/14: moderate LVH, EF 65%, normal wall motion, diastolic dysfunction, mild AI, mild MR  . Asthma   . Shortness of breath     a. PFTs 5/14:  FEV1/FVC 99% predicted; FVC 60% predicted; DLCO mildly reduced, mild restriction, air trapping.;  b.  seen by pulmo (Dr. [name]) 5/14: mainly upper airway symptoms - ACE d/c'd (sx's better)  . Chronic diastolic CHF (congestive heart failure)   . Constipation   . Lymphadenopathy 03/21/2013  . Leukopenia 03/21/2013  . Unspecified deficiency anemia 03/29/2013  . Anginal pain     none in past year  . Anxiety   . Mental disorder   . Depression   . Stroke 2010ish  . Arthritis   . Pancytopenia   . Hyperlipemia   . Syphilis 04/25/2013    Past Surgical History  Procedure Laterality Date  . Ankle surgery      right  . Cardiac catheterization    . I&d extremity  08/09/2011    Procedure: IRRIGATION AND DEBRIDEMENT EXTREMITY;  Surgeon: [name], MD;  Location: [place];  Service: General;  Laterality: Left;  i & D Left axilla abscess  . Lymph node biopsy N/A 04/05/2013    Procedure: EXCISIONAL BIOPSY RIGHT SUBMANDIBULAR NODE, NASAL ENDOSCOPIC WITH BIOPSY NASAL PHARYNX;  Surgeon: [name], MD;  Location: [place];  Service: ENT;  Laterality: N/A;    Mikey College MS, RD, LDN [phone] Weekend/After Hours Pager

## 2013-06-04 LAB — BASIC METABOLIC PANEL
BUN: 17 mg/dL (ref 6–23)
CALCIUM: 10 mg/dL (ref 8.4–10.5)
CO2: 24 mEq/L (ref 19–32)
Chloride: 99 mEq/L (ref 96–112)
Creatinine, Ser: 0.81 mg/dL (ref 0.50–1.10)
GFR calc Af Amer: >90 mL/min (ref >90)
GFR, EST NON AFRICAN AMERICAN: 84 mL/min — AB (ref >90)
Glucose, Bld: 101 mg/dL — ABNORMAL HIGH (ref 70–99)
POTASSIUM: 4.1 meq/L (ref 3.7–5.3)
SODIUM: 139 meq/L (ref 137–147)

## 2013-06-04 LAB — URINE CULTURE

## 2013-06-04 LAB — CBC
HCT: 28 % — ABNORMAL LOW (ref 36.0–46.0)
HEMOGLOBIN: 9.1 g/dL — AB (ref 12.0–15.0)
MCH: 23.9 pg — AB (ref 26.0–34.0)
MCHC: 32.5 g/dL (ref 30.0–36.0)
MCV: 73.7 fL — ABNORMAL LOW (ref 78.0–100.0)
PLATELETS: 388 10*3/uL (ref 150–400)
RBC: 3.8 MIL/uL — ABNORMAL LOW (ref 3.87–5.11)
RDW: 16.6 % — ABNORMAL HIGH (ref 11.5–15.5)
WBC: 1.4 10*3/uL — CL (ref 4.0–10.5)

## 2013-06-04 MED ORDER — TRAZODONE HCL 50 MG PO TABS
50.0000 mg | ORAL_TABLET | Freq: Once | ORAL | Status: AC
  Administered 2013-06-04: 50 mg via ORAL
  Filled 2013-06-04: qty 1

## 2013-06-04 NOTE — Progress Notes (Signed)
[facility] PROGRESS NOTE  Morgan Bean QQP:[id number] DOB: Jul 31, 1965 DOA: 06/02/2013 PCP: [name], MD  Assessment/Plan: 1. Acute supraglottitis, status post transnasal fiberoptic laryngoscopy, procedure performed on 06/02/2013 by Dr. Redmond Baseman. Patient was started on broad-spectrum IV antimicrobial therapy, reporting further improvement today. Stable from an airway standpoint, will transfer to Med/Surg. 2. Neutropenia. Patient with history of neutropenia who has been seen by hematology at the [facility] . She had a bone marrow biopsy performed in January of 2015 which was unrevealing. Case discussed with hematology as recommended by infectious disease. Dr Alen Blew feel it would be reasonable to start Neulasta 300 mcg subcutaneous daily. 3. History of syphilis. Infectious disease consulted. It's possible that doxycycline may have lead to worsening neutropenia. This agent was discontinued on admission. 4. History of hypertension. Her blood pressures are stable with systolic blood pressures in the 120s. She remains on Lasix 20 mg daily.  Code Status: Full Code Family Communication: Disposition Plan: Pending ID consult, advancing diet as tolerated, transfer to Med/Surg   Consultants:  Infectious disease  ENT  Telephone consultation made to hematology  Procedures:  Transnasal fiberoptic laryngoscopy performed on 06/02/2013  Antibiotics:  Cefepime 2 g  IV every 8 hours  Clindamycin 600 mg every 8 hours  Vancomycin  HPI/Subjective: Patient is a pleasant 48 year old female with a past medical history of pancytopenia who is being evaluated at the [facility], undergoing bone marrow biopsy which came back negative. She was recently diagnosed with syphilis and started on doxycycline therapy. Patient was admitted to the medicine service on 06/02/2013 presenting with neck swelling and sore throat. A CT scan of the neck performed on 06/02/2013 revealed extensive edematous  changes throughout the oropharynx, hypopharynx and larynx compatible with diffuse pharyngitis. There is also prominent swelling of the epiglottis. Labs revealed the presence of neutropenia as she was started on broad-spectrum empiric IV antibiotic therapy with cefepime 2 g IV every 8 hours clindamycin 600 mg IV every 8 hours and vancomycin IV. Patient was seen and evaluated by ENT as if she underwent transnasal fiberoptic lower endoscopy on 06/02/2013 which showed thickened epiglottis with arytenoids edematous and overhanging the glottis. She reported feeling some improvement on the following day as her diet was advanced to clears. ID recommended hematology consult for role of GCSF in this setting. I discussed case with Dr.Shadad who felt it would be reasonable to treat with Neulasta 300 mcg daily. By 06/04/13 she reported ongoing improvement with stability of airway. Transfer orders written for Med/Surg.   Objective: Filed Vitals:   06/04/13 0700  BP:   Pulse:   Temp: 97.6 F (36.4 C)  Resp:     Intake/Output Summary (Last 24 hours) at 06/04/13 7124 Last data filed at 06/03/13 1500  Gross per 24 hour  Intake    100 ml  Output    200 ml  Net   -100 ml   Filed Weights   06/02/13 1927 06/03/13 0025 06/04/13 0435  Weight: 77.202 kg (170 lb 3.2 oz) 75.7 kg (166 lb 14.2 oz) 73.8 kg (162 lb 11.2 oz)    Exam:   General:  Patient does not appear to be in acute distress, she seems to be breathing comfortably, I did not note obvious stridor or use of accessory muscles.  Cardiovascular: Regular rate rhythm  Respiratory: Coarse respiratory sounds, having normal respiratory effort  Abdomen: Soft nontender nondistended  Musculoskeletal: No edema  Data Reviewed: Basic Metabolic Panel:  Recent Labs Lab 05/30/13 1236 06/02/13 1800 06/03/13 5809  06/04/13 0237  NA 138 132* 133* 139  K 4.1 3.8 3.3* 4.1  CL  --  95* 97 99  CO2 $Re'24 22 23 24  'wyv$ GLUCOSE 99 100* 106* 101*  BUN 13.$Remov'6 9 9 17   'zrXnCH$ CREATININE 1.2* 1.03 0.93 0.81  CALCIUM 9.9 9.4 9.1 10.0   Liver Function Tests:  Recent Labs Lab 05/30/13 1236  AST 8  ALT <6  ALKPHOS 73  BILITOT 0.27  PROT 9.3*  ALBUMIN 2.8*   No results found for this basename: LIPASE, AMYLASE,  in the last 168 hours No results found for this basename: AMMONIA,  in the last 168 hours CBC:  Recent Labs Lab 05/30/13 1235 06/02/13 1800 06/03/13 0338 06/04/13 0237  WBC 2.1* 1.5* 1.6* 1.4*  NEUTROABS  --  0.0*  --   --   HGB 8.6* 8.5* 8.3* 9.1*  HCT 27.6* 26.7* 26.5* 28.0*  MCV 74.8* 74.0* 75.3* 73.7*  PLT 331 347 297 388   Cardiac Enzymes:  Recent Labs Lab 06/02/13 1800  TROPONINI <0.30   BNP (last 3 results)  Recent Labs  04/16/13 1616 05/02/13 1010 06/02/13 1800  PROBNP 415.6* 132.0* 500.8*   CBG: No results found for this basename: GLUCAP,  in the last 168 hours  Recent Results (from the past 240 hour(s))  AFB CULTURE, BLOOD     Status: None   Collection Time    06/02/13  9:04 PM      Result Value Ref Range Status   Specimen Description BLOOD RIGHT ARM   Final   Special Requests NONE   Final   Culture     Final   Value: CULTURE WILL BE EXAMINED FOR 6 WEEKS BEFORE ISSUING A FINAL REPORT     Performed at [facility]   Report Status PENDING   Incomplete  MRSA PCR SCREENING     Status: None   Collection Time    06/03/13  3:45 AM      Result Value Ref Range Status   MRSA by PCR NEGATIVE  NEGATIVE Final   Comment:            The GeneXpert MRSA Assay (FDA     approved for NASAL specimens     only), is one component of a     comprehensive MRSA colonization     surveillance program. It is not     intended to diagnose MRSA     infection nor to guide or     monitor treatment for     MRSA infections.     Studies: Dg Neck Soft Tissue  06/02/2013   CLINICAL DATA:  Sore throat  EXAM: NECK SOFT TISSUES - 1+ VIEW  COMPARISON:  02/15/2013  FINDINGS: No acute bony abnormality is seen. There is some  steepling of the subglottic airway and mild narrowing in the AP dimension on the lateral projection. Some soft tissue fullness is noted just above the thyroid cartilage. This has increased somewhat in the interval from the prior CT examination. No acute bony abnormality is seen. Some chronic calcifications are noted.  IMPRESSION: Soft tissue fullness just above the thyroid cartilage in the proximal trachea/subglottic airway. This has increased from the prior CT examination. Direct visualization may be helpful.  The epiglottis is stable in appearance from the prior exam.   Electronically Signed   By: Inez Catalina M.D.   On: 06/02/2013 19:00   Dg Chest 2 View  06/02/2013   CLINICAL DATA:  Shortness of breath  EXAM: CHEST  2 VIEW  COMPARISON:  05/04/2013  FINDINGS: Cardiac shadow is within normal limits. Lungs are clear bilaterally. No sizable effusion is noted. No bony abnormality is seen.  IMPRESSION: No acute abnormality noted.   Electronically Signed   By: Inez Catalina M.D.   On: 06/02/2013 18:56   Ct Soft Tissue Neck W Contrast  06/02/2013   CLINICAL DATA:  Neck swelling and pain.  Next item  EXAM: CT NECK WITH CONTRAST  TECHNIQUE: Multidetector CT imaging of the neck was performed using the standard protocol following the bolus administration of intravenous contrast.  CONTRAST:  64mL OMNIPAQUE IOHEXOL 300 MG/ML  SOLN  COMPARISON:  DG NECK SOFT TISSUE dated 06/02/2013; CT NECK W/CM dated 02/15/2013; NM PET IMAGE INITIAL (PI) SKULL BASE TO THIGH dated 03/29/2013  FINDINGS: Extensive mucosal thickening is present throughout the oropharynx hypopharynx and larynx. Prominent posterior mucosal thickening within the hypopharynx narrows the airway. The epiglottis is inflamed. There is marked enhancement of the palatine tonsils bilaterally without abscess. Edematous changes are present in the parapharyngeal space bilaterally.  Multiple enlarged hyper enhancing nodes are present bilaterally. There are enlarged nodes in  the submandibular space bilaterally, measuring up to 17 x 13 mm on the right and 13 x 20 mm on the left. An enlarged right submental lymph node is stable at 16 x 12 mm. There is increased edematous changes throughout the anterior neck surrounding the strap muscles and thyroid. No discrete thyroid nodule is evident. Bilateral cervical adenopathy is similar to the prior studies. An anterior right level 2 lymph node measures 16 x 22 mm on the sagittal reformats. The anterior left level 2 lymph node measures 19 x 13 mm.  Degenerative changes of the cervical spine are most evident at C5-6 with uncovertebral and foraminal narrowing bilaterally.  IMPRESSION: 1. Extensive edematous changes throughout the oropharynx, hypopharynx, and larynx compatible with a diffuse pharyngitis. No discrete abscess is present. 2. Prominent swelling the epiglottis compatible with epiglottitis. 3. Extensive bilateral cervical adenopathy, most prominent level 1 L2 stations. 4. Increase in diffuse edema within the deep cervical spaces and surrounding strap muscles and thyroid. No focal thyroid lesion is evident. 5. Focal narrowing of the airway within the hypopharynx secondary to extensive posterior pharyngeal edema.   Electronically Signed   By: Lawrence Santiago M.D.   On: 06/02/2013 20:20    Scheduled Meds: . aspirin  300 mg Rectal Daily  . ceFEPime (MAXIPIME) IV  2 g Intravenous Q8H  . clindamycin (CLEOCIN) IV  600 mg Intravenous 3 times per day  . dexamethasone  20 mg Intravenous Q24H  . enoxaparin (LOVENOX) injection  40 mg Subcutaneous Q24H  . furosemide  20 mg Intravenous Daily  . Tbo-Filgrastim  300 mcg Subcutaneous Daily   Continuous Infusions:   Active Problems:   Lymphadenopathy   Neutropenic fever    Time spent: 35 min    Kelvin Cellar  [facility] Pager [phone]. If 7PM-7AM, please contact night-coverage at [email or web address], password [facility] 06/04/2013, 8:08 AM  LOS: 2 days

## 2013-06-04 NOTE — Progress Notes (Signed)
Subjective: Voice has improved further.  No breathing difficulty.  Still only taking ice chips po.  Objective: Vital signs in last 24 hours: Temp:  [97.5 F (36.4 C)-98.3 F (36.8 C)] 97.8 F (36.6 C) (03/15 0435) Pulse Rate:  [79-96] 82 (03/15 0435) Resp:  [13-22] 16 (03/15 0435) BP: (122-133)/(80-96) 128/90 mmHg (03/15 0435) SpO2:  [100 %] 100 % (03/15 0435) Weight:  [73.8 kg (162 lb 11.2 oz)] 73.8 kg (162 lb 11.2 oz) (03/15 0435)    Intake/Output from previous day: 03/14 0701 - 03/15 0700 In: 100 [IV Piggyback:100] Out: 200 [Urine:200] Intake/Output this shift:    General appearance: alert, cooperative and no distress Throat: no stridor.  Voice nearly normal.  Lab Results:   Recent Labs  06/02/13 1800 06/03/13 0338  WBC 1.5* 1.6*  HGB 8.5* 8.3*  HCT 26.7* 26.5*  PLT 347 297   BMET  Recent Labs  06/03/13 0338 06/04/13 0237  NA 133* 139  K 3.3* 4.1  CL 97 99  CO2 23 24  GLUCOSE 106* 101*  BUN 9 17  CREATININE 0.93 0.81  CALCIUM 9.1 10.0   PT/INR No results found for this basename: LABPROT, INR,  in the last 72 hours ABG No results found for this basename: PHART, PCO2, PO2, HCO3,  in the last 72 hours  Studies/Results: Dg Neck Soft Tissue  06/02/2013   CLINICAL DATA:  Sore throat  EXAM: NECK SOFT TISSUES - 1+ VIEW  COMPARISON:  02/15/2013  FINDINGS: No acute bony abnormality is seen. There is some steepling of the subglottic airway and mild narrowing in the AP dimension on the lateral projection. Some soft tissue fullness is noted just above the thyroid cartilage. This has increased somewhat in the interval from the prior CT examination. No acute bony abnormality is seen. Some chronic calcifications are noted.  IMPRESSION: Soft tissue fullness just above the thyroid cartilage in the proximal trachea/subglottic airway. This has increased from the prior CT examination. Direct visualization may be helpful.  The epiglottis is stable in appearance from the  prior exam.   Electronically Signed   By: Inez Catalina M.D.   On: 06/02/2013 19:00   Dg Chest 2 View  06/02/2013   CLINICAL DATA:  Shortness of breath  EXAM: CHEST  2 VIEW  COMPARISON:  05/04/2013  FINDINGS: Cardiac shadow is within normal limits. Lungs are clear bilaterally. No sizable effusion is noted. No bony abnormality is seen.  IMPRESSION: No acute abnormality noted.   Electronically Signed   By: Inez Catalina M.D.   On: 06/02/2013 18:56   Ct Soft Tissue Neck W Contrast  06/02/2013   CLINICAL DATA:  Neck swelling and pain.  Next item  EXAM: CT NECK WITH CONTRAST  TECHNIQUE: Multidetector CT imaging of the neck was performed using the standard protocol following the bolus administration of intravenous contrast.  CONTRAST:  69mL OMNIPAQUE IOHEXOL 300 MG/ML  SOLN  COMPARISON:  DG NECK SOFT TISSUE dated 06/02/2013; CT NECK W/CM dated 02/15/2013; NM PET IMAGE INITIAL (PI) SKULL BASE TO THIGH dated 03/29/2013  FINDINGS: Extensive mucosal thickening is present throughout the oropharynx hypopharynx and larynx. Prominent posterior mucosal thickening within the hypopharynx narrows the airway. The epiglottis is inflamed. There is marked enhancement of the palatine tonsils bilaterally without abscess. Edematous changes are present in the parapharyngeal space bilaterally.  Multiple enlarged hyper enhancing nodes are present bilaterally. There are enlarged nodes in the submandibular space bilaterally, measuring up to 17 x 13 mm on the right and  13 x 20 mm on the left. An enlarged right submental lymph node is stable at 16 x 12 mm. There is increased edematous changes throughout the anterior neck surrounding the strap muscles and thyroid. No discrete thyroid nodule is evident. Bilateral cervical adenopathy is similar to the prior studies. An anterior right level 2 lymph node measures 16 x 22 mm on the sagittal reformats. The anterior left level 2 lymph node measures 19 x 13 mm.  Degenerative changes of the cervical spine  are most evident at C5-6 with uncovertebral and foraminal narrowing bilaterally.  IMPRESSION: 1. Extensive edematous changes throughout the oropharynx, hypopharynx, and larynx compatible with a diffuse pharyngitis. No discrete abscess is present. 2. Prominent swelling the epiglottis compatible with epiglottitis. 3. Extensive bilateral cervical adenopathy, most prominent level 1 L2 stations. 4. Increase in diffuse edema within the deep cervical spaces and surrounding strap muscles and thyroid. No focal thyroid lesion is evident. 5. Focal narrowing of the airway within the hypopharynx secondary to extensive posterior pharyngeal edema.   Electronically Signed   By: Lawrence Santiago M.D.   On: 06/02/2013 20:20    Anti-infectives: Anti-infectives   Start     Dose/Rate Route Frequency Ordered Stop   06/03/13 0600  clindamycin (CLEOCIN) IVPB 600 mg     600 mg 100 mL/hr over 30 Minutes Intravenous 3 times per day 06/03/13 0036     06/03/13 0400  ceFEPIme (MAXIPIME) 2 g in dextrose 5 % 50 mL IVPB     2 g 100 mL/hr over 30 Minutes Intravenous Every 8 hours 06/02/13 2020     06/02/13 2045  clindamycin (CLEOCIN) IVPB 600 mg     600 mg 100 mL/hr over 30 Minutes Intravenous  Once 06/02/13 2038 06/02/13 2336   06/02/13 2015  ceFEPIme (MAXIPIME) 2 g in dextrose 5 % 50 mL IVPB     2 g 100 mL/hr over 30 Minutes Intravenous STAT 06/02/13 2012 06/02/13 2212      Assessment/Plan: Acute supraglottitis Airway symptoms largely resolved.  Continue IV antibiotic therapy per ID recs.  Advance diet as able.  Call with concerns.  LOS: 2 days    Morgan Bean 06/04/2013

## 2013-06-04 NOTE — Consult Note (Signed)
[facility]  Total days of antibiotics 3        Day 3 cefepime        Day 3 clindamycin               Reason for Consult: febrile neutropenia and lymphadenopathy    Referring Physician: zamora  Active Problems:   Lymphadenopathy   Neutropenic fever    HPI: Morgan Bean is a 48 y.o. female with previously poorly controlled HTN, CAD with pHTN, pontine CVA in 2009, latent syphilis 2009, hx of cocaine use. Based on historical labs, it appears that she started to develop leukopenia/neutropenia since April 2014 but then developed diffuse lymphadenopathy noted since Fall 2014. She was first seen in the ED in Nov 2014 for dysphagia in setting of fever, CT found submandibular lymphadenopathy. [facility] hem/onc, Dr Alvy Bimler on 03/21/13 for evaluation of LN, neutropenia ([place] of 100 at that time), anorexia and weight loss. She underwent PET schan that showed low metabolic activity but + lymphadenopathy. She had submandibular LN biopsy by Dr. Erik Obey on 5/95/63 which had plasmocytes suspicious for IgG disease. No mention of afb or granulomas. She underwent BM bx on 04/20/13 which showed signs c/w leukopenia/neutropenia. Since malignancy was not necessarily confirmed, she was referred to ID clinic to see this week. At her last visit with Dr. Alvy Bimler, on 04/25/13 she was started on doxycycline $RemoveBefore'100mg'[id number]$  BID for 1 month in order to treat latent syphilis as possible cause of her cell line abnormalities. She remembers having dx of syphilis prior to 2009.  She was admitted on 06/02/13 due to worsening dysphagia due to lymphadenopathy and found to have isolated fever. She was diagnosed with acute supraglottitis after imaging and evaluation by ENT. Started on cefepime and clindamycin, as well as steroids. Her labs still showed wbc of 1.4/ANC 0. She reports that she can swallow now, and swelling of lymph nodes is much improved. During these last four months, she  has drenching nightsweats, and 40 lb weight loss.  The patient has history of latent syphilis treated back in July 2009 during admission for stroke. She thinks she did her 2 addn doses [facility]. RPR 1:4 in 2009. Has had repeat testing for HIV which is negative. Had false positive HIV EIA in Dec 2014.HIV VL<20  Her neutropenia is subacute. ANC 0 on 2/11 ; [place] 100 on 1/7; ANC 100 on 12/30; vs. ANC 1.7 on Aug 2014.bu [place] 1000 in Oct 2013  Past Medical History  Diagnosis Date  . Coronary artery disease     Lexiscan myoview (10/12) with significant ST depression upon Lexiscan injection but no ischemia or infarction on perfusion images. Left heart cath (10/12): 30% mid LAD, 30% ostial D1, 50% ostial D2.    [name] Hypertension   . Tricuspid regurgitation   . Iron deficiency     hx of  . Polysubstance abuse     Prior cocaine  . Tobacco abuse   . Leukocytoclastic vasculitis     Rash across lower body, occurred in 9/11, diagnosed by skin biopsy. ANA positive. Thought to be secondary to cocaine use.  . Pulmonary HTN     Echo (9/11) with EF 65%, mild LVH, mild AI, mild MR, moderate to possibly severe TR with PA systolic pressure 52 mmHg. Echo (10/12): severe LV hypertrophy, EF 60-65%, mild MR, mild AI, moderate to severe tricuspid regurgitation, PA systolic pressure 38  mmHg.  Echo 4/14: moderate LVH, EF 65%, normal wall motion, diastolic dysfunction, mild AI, mild MR  . Asthma   . Shortness of breath     a. PFTs 5/14:  FEV1/FVC 99% predicted; FVC 60% predicted; DLCO mildly reduced, mild restriction, air trapping.;  b.  seen by pulmo (Dr. [name]) 5/14: mainly upper airway symptoms - ACE d/c'd (sx's better)  . Chronic diastolic CHF (congestive heart failure)   . Constipation   . Lymphadenopathy 03/21/2013  . Leukopenia 03/21/2013  . Unspecified deficiency anemia 03/29/2013  . Anginal pain     none in past year  . Anxiety   . Mental disorder   . Depression   . Stroke  2010ish  . Arthritis   . Pancytopenia   . Hyperlipemia   . Syphilis 04/25/2013    Allergies:  Allergies  Allergen Reactions  . Ciprofloxacin Itching  . Lipitor [Atorvastatin] Swelling   MEDICATIONS: . aspirin  300 mg Rectal Daily  . ceFEPime (MAXIPIME) IV  2 g Intravenous Q8H  . clindamycin (CLEOCIN) IV  600 mg Intravenous 3 times per day  . dexamethasone  20 mg Intravenous Q24H  . enoxaparin (LOVENOX) injection  40 mg Subcutaneous Q24H  . furosemide  20 mg Intravenous Daily  . Tbo-Filgrastim  300 mcg Subcutaneous Daily    History  Substance Use Topics  . Smoking status: Former Smoker -- 0.25 packs/day for 30 years    Types: Cigarettes    Quit date: 05/23/2013  . Smokeless tobacco: Never Used     Comment: 5 cigs --- uses E-cig  . Alcohol Use: No     Comment: pint of wine 2-3 times a week--ocassional    Family History  Problem Relation Age of Onset  . Coronary artery disease Mother   . Diabetes Mother   . Breast cancer Maternal Grandmother   . Diabetes Maternal Grandmother   . Diabetes Father   . Hypertension Father   . Coronary artery disease Father     Review of Systems  Constitutional: positive for fever, NS, fatigue, and weight loss.  HENT: Negative for congestion, sore throat, rhinorrhea, sneezing, trouble swallowing and sinus pressure.  Eyes: Negative for photophobia and visual disturbance.  Respiratory: Negative for cough, chest tightness, shortness of breath, wheezing and stridor.  Cardiovascular: Negative for chest pain, palpitations and leg swelling.  Gastrointestinal: Negative for nausea, vomiting, abdominal pain, diarrhea, constipation, blood in stool, abdominal distention and anal bleeding.  Genitourinary: Negative for dysuria, hematuria, flank pain and difficulty urinating.  Musculoskeletal: Negative for myalgias, back pain, joint swelling, arthralgias and gait problem.  Skin: Negative for color change, pallor, rash and wound.  Neurological: Negative  for dizziness, tremors, weakness and light-headedness.  Hematological: positive for  adenopathy. Psychiatric/Behavioral: Negative for behavioral problems, confusion, sleep disturbance, dysphoric mood, decreased concentration and agitation.     OBJECTIVE: Temp:  [97.5 F (36.4 C)-97.9 F (36.6 C)] 97.9 F (36.6 C) (03/15 1533) Pulse Rate:  [79-101] 83 (03/15 1535) Resp:  [15-28] 28 (03/15 1535) BP: (122-140)/(81-96) 128/87 mmHg (03/15 1535) SpO2:  [100 %] 100 % (03/15 1535) Weight:  [162 lb 11.2 oz (73.8 kg)] 162 lb 11.2 oz (73.8 kg) (03/15 0435)  Constitutional:  oriented to person, place, and time. appears well-developed and well-nourished. No distress. Pleasant female HENT: poor dentition missing a few front teeth Mouth/Throat: Oropharynx is clear and moist. No oropharyngeal exudate. Mild thrush Cardiovascular: Normal rate, regular rhythm and normal heart sounds. Exam reveals no gallop and no friction rub.  No murmur heard.  Pulmonary/Chest: Effort normal and breath sounds normal. No respiratory distress.  has no wheezes.  Abdominal: Soft. Bowel sounds are normal.  exhibits no distension. There is no tenderness.  Lymphadenopathy: no cervical adenopathy.  Neurological: alert and oriented to person, place, and time.  Skin: Skin is warm and dry. No rash noted. No erythema.  Psychiatric: a normal mood and affect. His behavior is normal.   LABS: Results for orders placed during the hospital encounter of 06/02/13 (from the past 48 hour(s))  CBC WITH DIFFERENTIAL     Status: Abnormal   Collection Time    06/02/13  6:00 PM      Result Value Ref Range   WBC 1.5 (*) 4.0 - 10.5 K/uL   RBC 3.61 (*) 3.87 - 5.11 MIL/uL   Hemoglobin 8.5 (*) 12.0 - 15.0 g/dL   HCT 26.7 (*) 36.0 - 46.0 %   MCV 74.0 (*) 78.0 - 100.0 fL   MCH 23.5 (*) 26.0 - 34.0 pg   MCHC 31.8  30.0 - 36.0 g/dL   RDW 16.8 (*) 11.5 - 15.5 %   Platelets 347  150 - 400 K/uL   Neutrophils Relative % 1 (*) 43 - 77 %    Lymphocytes Relative 85 (*) 12 - 46 %   Monocytes Relative 14 (*) 3 - 12 %   Eosinophils Relative 0  0 - 5 %   Basophils Relative 0  0 - 1 %   Neutro Abs 0.0 (*) 1.7 - 7.7 K/uL   Lymphs Abs 1.3  0.7 - 4.0 K/uL   Monocytes Absolute 0.2  0.1 - 1.0 K/uL   Eosinophils Absolute 0.0  0.0 - 0.7 K/uL   Basophils Absolute 0.0  0.0 - 0.1 K/uL   WBC Morphology WHITE COUNT CONFIRMED ON SMEAR    BASIC METABOLIC PANEL     Status: Abnormal   Collection Time    06/02/13  6:00 PM      Result Value Ref Range   Sodium 132 (*) 137 - 147 mEq/L   Potassium 3.8  3.7 - 5.3 mEq/L   Chloride 95 (*) 96 - 112 mEq/L   CO2 22  19 - 32 mEq/L   Glucose, Bld 100 (*) 70 - 99 mg/dL   BUN 9  6 - 23 mg/dL   Creatinine, Ser 1.03  0.50 - 1.10 mg/dL   Calcium 9.4  8.4 - 10.5 mg/dL   GFR calc non Af Amer 63 (*) >90 mL/min   GFR calc Af Amer 73 (*) >90 mL/min   Comment: (NOTE)     The eGFR has been calculated using the CKD EPI equation.     This calculation has not been validated in all clinical situations.     eGFR's persistently <90 mL/min signify possible Chronic Kidney     Disease.  PRO B NATRIURETIC PEPTIDE     Status: Abnormal   Collection Time    06/02/13  6:00 PM      Result Value Ref Range   Pro B Natriuretic peptide (BNP) 500.8 (*) 0 - 125 pg/mL  TROPONIN I     Status: None   Collection Time    06/02/13  6:00 PM      Result Value Ref Range   Troponin I <0.30  <0.30 ng/mL   Comment:            Due to the release kinetics of cTnI,     a negative result within the first hours  of the onset of symptoms does not rule out     myocardial infarction with certainty.     If myocardial infarction is still suspected,     repeat the test at appropriate intervals.  CULTURE, BLOOD (ROUTINE X 2)     Status: None   Collection Time    06/02/13  8:28 PM      Result Value Ref Range   Specimen Description BLOOD BLOOD RIGHT FOREARM     Special Requests BOTTLES DRAWN AEROBIC AND ANAEROBIC 3CC     Culture  Setup Time        Value: 06/03/2013 00:45     Performed at [facility]   Culture       Value:        BLOOD CULTURE RECEIVED NO GROWTH TO DATE CULTURE WILL BE HELD FOR 5 DAYS BEFORE ISSUING A FINAL NEGATIVE REPORT     Performed at [facility]   Report Status PENDING    CULTURE, BLOOD (ROUTINE X 2)     Status: None   Collection Time    06/02/13  8:28 PM      Result Value Ref Range   Specimen Description BLOOD BLOOD RIGHT FOREARM     Special Requests BOTTLES DRAWN AEROBIC AND ANAEROBIC 4CC     Culture  Setup Time       Value: 06/03/2013 00:46     Performed at [facility]   Culture       Value:        BLOOD CULTURE RECEIVED NO GROWTH TO DATE CULTURE WILL BE HELD FOR 5 DAYS BEFORE ISSUING A FINAL NEGATIVE REPORT     Performed at [facility]   Report Status PENDING    RAPID HIV SCREEN ([facility])     Status: None   Collection Time    06/02/13  8:28 PM      Result Value Ref Range   SUDS Rapid HIV Screen NON REACTIVE  NON REACTIVE   Comment: RESULT CALLED TO, READ BACK BY AND VERIFIED WITH:     SPOKE WITH SHELL,A RN [id number] [place],N   AFB CULTURE, BLOOD     Status: None   Collection Time    06/02/13  9:04 PM      Result Value Ref Range   Specimen Description BLOOD RIGHT ARM     Special Requests NONE     Culture       Value: CULTURE WILL BE EXAMINED FOR 6 WEEKS BEFORE ISSUING A FINAL REPORT     Performed at [facility]   Report Status PENDING    URINE CULTURE     Status: None   Collection Time    06/02/13  9:10 PM      Result Value Ref Range   Specimen Description URINE, CLEAN CATCH     Special Requests NONE     Culture  Setup Time       Value: 06/03/2013 01:42     Performed at [facility] Count       Value: 30,000 COLONIES/ML     Performed at [facility]   Culture       Value: Multiple bacterial morphotypes present, none predominant. Suggest appropriate recollection if clinically indicated.     Performed at  [facility]   Report Status 06/04/2013 FINAL    URINALYSIS, ROUTINE W REFLEX MICROSCOPIC     Status: Abnormal   Collection Time  06/02/13  9:10 PM      Result Value Ref Range   Color, Urine YELLOW  YELLOW   APPearance CLOUDY (*) CLEAR   Specific Gravity, Urine >1.046 (*) 1.005 - 1.030   pH 5.0  5.0 - 8.0   Glucose, UA NEGATIVE  NEGATIVE mg/dL   Hgb urine dipstick MODERATE (*) NEGATIVE   Bilirubin Urine NEGATIVE  NEGATIVE   Ketones, ur 15 (*) NEGATIVE mg/dL   Protein, ur 30 (*) NEGATIVE mg/dL   Urobilinogen, UA 0.2  0.0 - 1.0 mg/dL   Nitrite NEGATIVE  NEGATIVE   Leukocytes, UA NEGATIVE  NEGATIVE  URINE MICROSCOPIC-ADD ON     Status: Abnormal   Collection Time    06/02/13  9:10 PM      Result Value Ref Range   Squamous Epithelial / LPF FEW (*) RARE   WBC, UA 3-6  <3 WBC/hpf   RBC / HPF 7-10  <3 RBC/hpf   Bacteria, UA FEW (*) RARE   Urine-Other MUCOUS PRESENT     Comment: AMORPHOUS URATES/PHOSPHATES  BASIC METABOLIC PANEL     Status: Abnormal   Collection Time    06/03/13  3:38 AM      Result Value Ref Range   Sodium 133 (*) 137 - 147 mEq/L   Potassium 3.3 (*) 3.7 - 5.3 mEq/L   Chloride 97  96 - 112 mEq/L   CO2 23  19 - 32 mEq/L   Glucose, Bld 106 (*) 70 - 99 mg/dL   BUN 9  6 - 23 mg/dL   Creatinine, Ser 0.93  0.50 - 1.10 mg/dL   Calcium 9.1  8.4 - 10.5 mg/dL   GFR calc non Af Amer 71 (*) >90 mL/min   GFR calc Af Amer 82 (*) >90 mL/min   Comment: (NOTE)     The eGFR has been calculated using the CKD EPI equation.     This calculation has not been validated in all clinical situations.     eGFR's persistently <90 mL/min signify possible Chronic Kidney     Disease.  CBC     Status: Abnormal   Collection Time    06/03/13  3:38 AM      Result Value Ref Range   WBC 1.6 (*) 4.0 - 10.5 K/uL   RBC 3.52 (*) 3.87 - 5.11 MIL/uL   Hemoglobin 8.3 (*) 12.0 - 15.0 g/dL   HCT 26.5 (*) 36.0 - 46.0 %   MCV 75.3 (*) 78.0 - 100.0 fL   MCH 23.6 (*) 26.0 - 34.0 pg   MCHC  31.3  30.0 - 36.0 g/dL   RDW 17.1 (*) 11.5 - 15.5 %   Platelets 297  150 - 400 K/uL  DIFFERENTIAL     Status: Abnormal   Collection Time    06/03/13  3:38 AM      Result Value Ref Range   Neutrophils Relative % 1 (*) 43 - 77 %   Lymphocytes Relative 81 (*) 12 - 46 %   Monocytes Relative 17 (*) 3 - 12 %   Eosinophils Relative 0  0 - 5 %   Basophils Relative 0  0 - 1 %   Band Neutrophils 1  0 - 10 %   Metamyelocytes Relative 0     Myelocytes 0     Promyelocytes Absolute 0     Blasts 0     nRBC 0  0 /100 WBC  MRSA PCR SCREENING     Status: None  Collection Time    06/03/13  3:45 AM      Result Value Ref Range   MRSA by PCR NEGATIVE  NEGATIVE   Comment:            The GeneXpert MRSA Assay (FDA     approved for NASAL specimens     only), is one component of a     comprehensive MRSA colonization     surveillance program. It is not     intended to diagnose MRSA     infection nor to guide or     monitor treatment for     MRSA infections.  CBC     Status: Abnormal   Collection Time    06/04/13  2:37 AM      Result Value Ref Range   WBC 1.4 (*) 4.0 - 10.5 K/uL   Comment: REPEATED TO VERIFY     WHITE COUNT CONFIRMED ON SMEAR     CRITICAL RESULT CALLED TO, READ BACK BY AND VERIFIED WITH:     STANFIELD E.,RN 06/04/13 0753 BY JONES   RBC 3.80 (*) 3.87 - 5.11 MIL/uL   Hemoglobin 9.1 (*) 12.0 - 15.0 g/dL   HCT 28.0 (*) 36.0 - 46.0 %   MCV 73.7 (*) 78.0 - 100.0 fL   MCH 23.9 (*) 26.0 - 34.0 pg   MCHC 32.5  30.0 - 36.0 g/dL   RDW 16.6 (*) 11.5 - 15.5 %   Platelets 388  150 - 400 K/uL   Comment: REPEATED TO VERIFY     PLATELET COUNT CONFIRMED BY SMEAR  BASIC METABOLIC PANEL     Status: Abnormal   Collection Time    06/04/13  2:37 AM      Result Value Ref Range   Sodium 139  137 - 147 mEq/L   Potassium 4.1  3.7 - 5.3 mEq/L   Comment: DELTA CHECK NOTED   Chloride 99  96 - 112 mEq/L   CO2 24  19 - 32 mEq/L   Glucose, Bld 101 (*) 70 - 99 mg/dL   BUN 17  6 - 23 mg/dL    Creatinine, Ser 0.81  0.50 - 1.10 mg/dL   Calcium 10.0  8.4 - 10.5 mg/dL   GFR calc non Af Amer 84 (*) >90 mL/min   GFR calc Af Amer >90  >90 mL/min   Comment: (NOTE)     The eGFR has been calculated using the CKD EPI equation.     This calculation has not been validated in all clinical situations.     eGFR's persistently <90 mL/min signify possible Chronic Kidney     Disease.    MICRO:  3/13 blood cx ngtd 3/13 afb blood cx pending 3/13 urine cx insig growth IMAGING: Dg Neck Soft Tissue  06/02/2013   CLINICAL DATA:  Sore throat  EXAM: NECK SOFT TISSUES - 1+ VIEW  COMPARISON:  02/15/2013  FINDINGS: No acute bony abnormality is seen. There is some steepling of the subglottic airway and mild narrowing in the AP dimension on the lateral projection. Some soft tissue fullness is noted just above the thyroid cartilage. This has increased somewhat in the interval from the prior CT examination. No acute bony abnormality is seen. Some chronic calcifications are noted.  IMPRESSION: Soft tissue fullness just above the thyroid cartilage in the proximal trachea/subglottic airway. This has increased from the prior CT examination. Direct visualization may be helpful.  The epiglottis is stable in appearance from the prior exam.  Electronically Signed   By: Inez Catalina M.D.   On: 06/02/2013 19:00   Dg Chest 2 View  06/02/2013   CLINICAL DATA:  Shortness of breath  EXAM: CHEST  2 VIEW  COMPARISON:  05/04/2013  FINDINGS: Cardiac shadow is within normal limits. Lungs are clear bilaterally. No sizable effusion is noted. No bony abnormality is seen.  IMPRESSION: No acute abnormality noted.   Electronically Signed   By: Inez Catalina M.D.   On: 06/02/2013 18:56   Ct Soft Tissue Neck W Contrast  06/02/2013   CLINICAL DATA:  Neck swelling and pain.  Next item  EXAM: CT NECK WITH CONTRAST  TECHNIQUE: Multidetector CT imaging of the neck was performed using the standard protocol following the bolus administration of  intravenous contrast.  CONTRAST:  62mL OMNIPAQUE IOHEXOL 300 MG/ML  SOLN  COMPARISON:  DG NECK SOFT TISSUE dated 06/02/2013; CT NECK W/CM dated 02/15/2013; NM PET IMAGE INITIAL (PI) SKULL BASE TO THIGH dated 03/29/2013  FINDINGS: Extensive mucosal thickening is present throughout the oropharynx hypopharynx and larynx. Prominent posterior mucosal thickening within the hypopharynx narrows the airway. The epiglottis is inflamed. There is marked enhancement of the palatine tonsils bilaterally without abscess. Edematous changes are present in the parapharyngeal space bilaterally.  Multiple enlarged hyper enhancing nodes are present bilaterally. There are enlarged nodes in the submandibular space bilaterally, measuring up to 17 x 13 mm on the right and 13 x 20 mm on the left. An enlarged right submental lymph node is stable at 16 x 12 mm. There is increased edematous changes throughout the anterior neck surrounding the strap muscles and thyroid. No discrete thyroid nodule is evident. Bilateral cervical adenopathy is similar to the prior studies. An anterior right level 2 lymph node measures 16 x 22 mm on the sagittal reformats. The anterior left level 2 lymph node measures 19 x 13 mm.  Degenerative changes of the cervical spine are most evident at C5-6 with uncovertebral and foraminal narrowing bilaterally.  IMPRESSION: 1. Extensive edematous changes throughout the oropharynx, hypopharynx, and larynx compatible with a diffuse pharyngitis. No discrete abscess is present. 2. Prominent swelling the epiglottis compatible with epiglottitis. 3. Extensive bilateral cervical adenopathy, most prominent level 1 L2 stations. 4. Increase in diffuse edema within the deep cervical spaces and surrounding strap muscles and thyroid. No focal thyroid lesion is evident. 5. Focal narrowing of the airway within the hypopharynx secondary to extensive posterior pharyngeal edema.   Electronically Signed   By: Lawrence Santiago M.D.   On: 06/02/2013  20:20   Assessment/Plan:  48yo F who has developed leukopenia/neutropenia in addition to lymphadenopathy in the last 5-6 months of unclear etiology. Malignancies ruled out. 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 Won't check in setting of [place] of 0. To defer to outpatient = if this was due to syphilis would think RPR would be much greater than 1:2. = i don't think doxycycline caused her neutropenia since she has been neutropenic since December  Neutropenia = will continue with GCSF until anc> 500 x 2 days  Dr. Linus Salmons to provide further [place]. [facility]  [phone]   

## 2013-06-05 DIAGNOSIS — E43 Unspecified severe protein-calorie malnutrition: Secondary | ICD-10-CM | POA: Insufficient documentation

## 2013-06-05 DIAGNOSIS — J0430 Supraglottitis, unspecified, without obstruction: Secondary | ICD-10-CM

## 2013-06-05 LAB — CBC WITH DIFFERENTIAL/PLATELET
Band Neutrophils: 0 % (ref 0–10)
Basophils Absolute: 0 10*3/uL (ref 0.0–0.1)
Basophils Relative: 0 % (ref 0–1)
Blasts: 0 %
EOS PCT: 0 % (ref 0–5)
Eosinophils Absolute: 0 10*3/uL (ref 0.0–0.7)
HCT: 28.2 % — ABNORMAL LOW (ref 36.0–46.0)
HEMOGLOBIN: 9 g/dL — AB (ref 12.0–15.0)
LYMPHS ABS: 2.4 10*3/uL (ref 0.7–4.0)
LYMPHS PCT: 97 % — AB (ref 12–46)
MCH: 23.4 pg — ABNORMAL LOW (ref 26.0–34.0)
MCHC: 31.9 g/dL (ref 30.0–36.0)
MCV: 73.2 fL — ABNORMAL LOW (ref 78.0–100.0)
MONO ABS: 0.1 10*3/uL (ref 0.1–1.0)
MYELOCYTES: 0 %
Metamyelocytes Relative: 0 %
Monocytes Relative: 3 % (ref 3–12)
NEUTROS PCT: 0 % — AB (ref 43–77)
NRBC: 0 /100{WBCs}
Neutro Abs: 0 10*3/uL — ABNORMAL LOW (ref 1.7–7.7)
PLATELETS: 365 10*3/uL (ref 150–400)
Promyelocytes Absolute: 0 %
RBC: 3.85 MIL/uL — AB (ref 3.87–5.11)
RDW: 16.8 % — ABNORMAL HIGH (ref 11.5–15.5)
WBC: 2.5 10*3/uL — AB (ref 4.0–10.5)

## 2013-06-05 LAB — PATHOLOGIST SMEAR REVIEW

## 2013-06-05 NOTE — Progress Notes (Signed)
[facility] PROGRESS NOTE  Morgan Bean DQQ:[id number] DOB: 07/12/1965 DOA: 06/02/2013 PCP: [name], MD  Assessment/Plan: 1. Acute supraglottitis, resolving. Patient now tolerating by mouth intake. ID recommended continuing IV antibiotics until discharge. 2. Neutropenia. Patient with history of neutropenia who has been seen by hematology at the [facility] . She had a bone marrow biopsy performed in January of 2015 which was unrevealing. Infectious disease recommending bone marrow biopsy for AFB smear and culture, fungal smear and culture.  3. History of syphilis. Infectious disease consulted. It's possible that doxycycline may have lead to worsening neutropenia. This agent was discontinued on admission. 4. History of hypertension. Her blood pressures are stable with systolic blood pressures in the 120s. She remains on Lasix 20 mg daily.  Code Status: Full Code Family Communication: Disposition Plan: Will continue IV antibiotics, steroids, Neupogen, followup on a.m. CBC   Consultants:  Infectious disease  ENT  Telephone consultation made to hematology  Procedures:  Transnasal fiberoptic laryngoscopy performed on 06/02/2048  Antibiotics:  Cefepime 2 g  IV every 8 hours  Clindamycin 600 mg every 8 hours  Vancomycin  HPI/Subjective: Patient is a pleasant 48 year old female with a past medical history of pancytopenia who is being evaluated at the [facility], undergoing bone marrow biopsy which came back negative. She was recently diagnosed with syphilis and started on doxycycline therapy. Patient was admitted to the medicine service on 06/02/2013 presenting with neck swelling and sore throat. A CT scan of the neck performed on 06/02/2013 revealed extensive edematous changes throughout the oropharynx, hypopharynx and larynx compatible with diffuse pharyngitis. There is also prominent swelling of the epiglottis. Labs revealed the presence of neutropenia as she was  started on broad-spectrum empiric IV antibiotic therapy with cefepime 2 g IV every 8 hours clindamycin 600 mg IV every 8 hours and vancomycin IV. Patient was seen and evaluated by ENT as if she underwent transnasal fiberoptic lower endoscopy on 06/02/2013 which showed thickened epiglottis with arytenoids edematous and overhanging the glottis. She reported feeling some improvement on the following day as her diet was advanced to clears. ID recommended hematology consult for role of GCSF in this setting. I discussed case with Dr.Shadad who felt it would be reasonable to treat with Neulasta 300 mcg daily. By 06/04/13 she reported ongoing improvement with stability of airway. Transfer orders written for Med/Surg. By 06/05/2013 she reported feeling well, tolerating by mouth intake. Infectious disease recommending bone marrow biopsy for AFB smear and cultures, fungal smear and culture.    Objective: Filed Vitals:   06/05/13 1350  BP: 121/84  Pulse: 77  Temp: 98 F (36.7 C)  Resp: 16    Intake/Output Summary (Last 24 hours) at 06/05/13 1644 Last data filed at 06/05/13 1500  Gross per 24 hour  Intake    240 ml  Output      5 ml  Net    235 ml   Filed Weights   06/03/13 0025 06/04/13 0435 06/05/13 0639  Weight: 75.7 kg (166 lb 14.2 oz) 73.8 kg (162 lb 11.2 oz) 74.481 kg (164 lb 3.2 oz)    Exam:   General:  Patient does not appear to be in acute distress, she seems to be breathing comfortably, I did not note obvious stridor or use of accessory muscles.  Cardiovascular: Regular rate rhythm  Respiratory: Coarse respiratory sounds, having normal respiratory effort  Abdomen: Soft nontender nondistended  Musculoskeletal: No edema  Data Reviewed: Basic Metabolic Panel:  Recent Labs Lab 05/30/13 1236 06/02/13  1800 06/03/13 0338 06/04/13 0237  NA 138 132* 133* 139  K 4.1 3.8 3.3* 4.1  CL  --  95* 97 99  CO2 _0 GLUCOSE 99 100* 106* 101*  BUN 13._1 CREATININE 1.2*  1.03 0.93 0.81  CALCIUM 9.9 9.4 9.1 10.0   Liver Function Tests:  Recent Labs Lab 05/30/13 1236  AST 8  ALT <6  ALKPHOS 73  BILITOT 0.27  PROT 9.3*  ALBUMIN 2.8*   No results found for this basename: LIPASE, AMYLASE,  in the last 168 hours No results found for this basename: AMMONIA,  in the last 168 hours CBC:  Recent Labs Lab 05/30/13 1235 06/02/13 1800 06/03/13 0338 06/04/13 0237 06/05/13 0401  WBC 2.1* 1.5* 1.6* 1.4* 2.5*  NEUTROABS  --  0.0*  --   --  0.0*  HGB 8.6* 8.5* 8.3* 9.1* 9.0*  HCT 27.6* 26.7* 26.5* 28.0* 28.2*  MCV 74.8* 74.0* 75.3* 73.7* 73.2*  PLT 331 347 297 388 365   Cardiac Enzymes:  Recent Labs Lab 06/02/13 1800  TROPONINI <0.30   BNP (last 3 results)  Recent Labs  04/16/13 1616 05/02/13 1010 06/02/13 1800  PROBNP 415.6* 132.0* 500.8*   CBG: No results found for this basename: GLUCAP,  in the last 168 hours  Recent Results (from the past 240 hour(s))  CULTURE, BLOOD (ROUTINE X 2)     Status: None   Collection Time    06/02/13  8:28 PM      Result Value Ref Range Status   Specimen Description BLOOD BLOOD RIGHT FOREARM   Final   Special Requests BOTTLES DRAWN AEROBIC AND ANAEROBIC 3CC   Final   Culture  Setup Time     Final   Value: 06/03/2013 00:45     Performed at [facility]   Culture     Final   Value:        BLOOD CULTURE RECEIVED NO GROWTH TO DATE CULTURE WILL BE HELD FOR 5 DAYS BEFORE ISSUING A FINAL NEGATIVE REPORT     Performed at [facility]   Report Status PENDING   Incomplete  CULTURE, BLOOD (ROUTINE X 2)     Status: None   Collection Time    06/02/13  8:28 PM      Result Value Ref Range Status   Specimen Description BLOOD BLOOD RIGHT FOREARM   Final   Special Requests BOTTLES DRAWN AEROBIC AND ANAEROBIC 4CC   Final   Culture  Setup Time     Final   Value: 06/03/2013 00:46     Performed at [facility]   Culture     Final   Value:        BLOOD CULTURE RECEIVED NO GROWTH TO DATE CULTURE  WILL BE HELD FOR 5 DAYS BEFORE ISSUING A FINAL NEGATIVE REPORT     Performed at [facility]   Report Status PENDING   Incomplete  AFB CULTURE, BLOOD     Status: None   Collection Time    06/02/13  9:04 PM      Result Value Ref Range Status   Specimen Description BLOOD RIGHT ARM   Final   Special Requests NONE   Final   Culture     Final   Value: CULTURE WILL BE EXAMINED FOR 6 WEEKS BEFORE ISSUING A FINAL REPORT     Performed at [facility]   Report Status PENDING   Incomplete  URINE CULTURE     Status: None   Collection Time    06/02/13  9:10 PM      Result Value Ref Range Status   Specimen Description URINE, CLEAN CATCH   Final   Special Requests NONE   Final   Culture  Setup Time     Final   Value: 06/03/2013 01:42     Performed at [facility]     Final   Value: 30,000 COLONIES/ML     Performed at [facility]   Culture     Final   Value: Multiple bacterial morphotypes present, none predominant. Suggest appropriate recollection if clinically indicated.     Performed at [facility]   Report Status 06/04/2013 FINAL   Final  MRSA PCR SCREENING     Status: None   Collection Time    06/03/13  3:45 AM      Result Value Ref Range Status   MRSA by PCR NEGATIVE  NEGATIVE Final   Comment:            The GeneXpert MRSA Assay (FDA     approved for NASAL specimens     only), is one component of a     comprehensive MRSA colonization     surveillance program. It is not     intended to diagnose MRSA     infection nor to guide or     monitor treatment for     MRSA infections.     Studies: No results found.  Scheduled Meds: . aspirin  300 mg Rectal Daily  . ceFEPime (MAXIPIME) IV  2 g Intravenous Q8H  . clindamycin (CLEOCIN) IV  600 mg Intravenous 3 times per day  . dexamethasone  20 mg Intravenous Q24H  . enoxaparin (LOVENOX) injection  40 mg Subcutaneous Q24H  . furosemide  20 mg Intravenous Daily  . Tbo-Filgrastim   300 mcg Subcutaneous Daily   Continuous Infusions:   Active Problems:   Lymphadenopathy   Neutropenic fever   Protein-calorie malnutrition, severe    Time spent: 35 min    Kelvin Cellar  [facility] Pager [phone]. If 7PM-7AM, please contact night-coverage at [email or web address], password [facility] 06/05/2013, 4:44 PM  LOS: 3 days

## 2013-06-05 NOTE — Progress Notes (Signed)
[facility] for Infectious Disease  Date of Admission:  06/02/2013  Antibiotics: Cefepime, clindamycin  Subjective: Throat much better  Objective: Temp:  [97.8 F (36.6 C)-98.2 F (36.8 C)] 98 F (36.7 C) (03/16 1350) Pulse Rate:  [77-98] 77 (03/16 1350) Resp:  [16-28] 16 (03/16 1350) BP: (121-133)/(83-97) 121/84 mmHg (03/16 1350) SpO2:  [95 %-100 %] 99 % (03/16 1350) Weight:  [164 lb 3.2 oz (74.481 kg)] 164 lb 3.2 oz (74.481 kg) (03/16 0639)  General: Awake, alert, nad Skin: + pustular lesion superior gluteal fold Lungs: CTA B Cor: RRR without m Abdomen: sfot, nt, nd Ext: no edema HEENT: + multiple small lymph nodes  Lab Results Lab Results  Component Value Date   WBC 2.5* 06/05/2013   HGB 9.0* 06/05/2013   HCT 28.2* 06/05/2013   MCV 73.2* 06/05/2013   PLT 365 06/05/2013    Lab Results  Component Value Date   CREATININE 0.81 06/04/2013   BUN 17 06/04/2013   NA 139 06/04/2013   K 4.1 06/04/2013   CL 99 06/04/2013   CO2 24 06/04/2013    Lab Results  Component Value Date   ALT <6 05/30/2013   AST 8 05/30/2013   ALKPHOS 73 05/30/2013   BILITOT 0.27 05/30/2013      Microbiology: Recent Results (from the past 240 hour(s))  CULTURE, BLOOD (ROUTINE X 2)     Status: None   Collection Time    06/02/13  8:28 PM      Result Value Ref Range Status   Specimen Description BLOOD BLOOD RIGHT FOREARM   Final   Special Requests BOTTLES DRAWN AEROBIC AND ANAEROBIC 3CC   Final   Culture  Setup Time     Final   Value: 06/03/2013 00:45     Performed at [facility]   Culture     Final   Value:        BLOOD CULTURE RECEIVED NO GROWTH TO DATE CULTURE WILL BE HELD FOR 5 DAYS BEFORE ISSUING A FINAL NEGATIVE REPORT     Performed at [facility]   Report Status PENDING   Incomplete  CULTURE, BLOOD (ROUTINE X 2)     Status: None   Collection Time    06/02/13  8:28 PM      Result Value Ref Range Status   Specimen Description BLOOD BLOOD RIGHT FOREARM   Final   Special Requests BOTTLES DRAWN AEROBIC AND ANAEROBIC 4CC   Final   Culture  Setup Time     Final   Value: 06/03/2013 00:46     Performed at [facility]   Culture     Final   Value:        BLOOD CULTURE RECEIVED NO GROWTH TO DATE CULTURE WILL BE HELD FOR 5 DAYS BEFORE ISSUING A FINAL NEGATIVE REPORT     Performed at [facility]   Report Status PENDING   Incomplete  AFB CULTURE, BLOOD     Status: None   Collection Time    06/02/13  9:04 PM      Result Value Ref Range Status   Specimen Description BLOOD RIGHT ARM   Final   Special Requests NONE   Final   Culture     Final   Value: CULTURE WILL BE EXAMINED FOR 6 WEEKS BEFORE ISSUING A FINAL REPORT     Performed at [facility]   Report Status PENDING   Incomplete  URINE CULTURE  Status: None   Collection Time    06/02/13  9:10 PM      Result Value Ref Range Status   Specimen Description URINE, CLEAN CATCH   Final   Special Requests NONE   Final   Culture  Setup Time     Final   Value: 06/03/2013 01:42     Performed at [facility]     Final   Value: 30,000 COLONIES/ML     Performed at [facility]   Culture     Final   Value: Multiple bacterial morphotypes present, none predominant. Suggest appropriate recollection if clinically indicated.     Performed at [facility]   Report Status 06/04/2013 FINAL   Final  MRSA PCR SCREENING     Status: None   Collection Time    06/03/13  3:45 AM      Result Value Ref Range Status   MRSA by PCR NEGATIVE  NEGATIVE Final   Comment:            The GeneXpert MRSA Assay (FDA     approved for NASAL specimens     only), is one component of a     comprehensive MRSA colonization     surveillance program. It is not     intended to diagnose MRSA     infection nor to guide or     monitor treatment for     MRSA infections.    Studies/Results: No results found.  Assessment/Plan: 1)  Neutropenia - unclear etiology.  Heme work  up unrevealing.  I have discussed with her the option of bone marrow for fungal or AFB organisms.  She is in agreement.   -consider bone marrow biopsy for AFB smear and cultures, fungal smear and culture.  Also AFB, fungal stains.   2) supraglottitis - continue on antibiotics.  Will change to po at discharge.   Scharlene Gloss, [facility] [place] [email or web address] [phone] pager   [phone] cell 06/05/2013, 3:04 PM

## 2013-06-06 DIAGNOSIS — R5081 Fever presenting with conditions classified elsewhere: Secondary | ICD-10-CM

## 2013-06-06 DIAGNOSIS — R599 Enlarged lymph nodes, unspecified: Secondary | ICD-10-CM

## 2013-06-06 DIAGNOSIS — D709 Neutropenia, unspecified: Secondary | ICD-10-CM

## 2013-06-06 LAB — BASIC METABOLIC PANEL
BUN: 20 mg/dL (ref 6–23)
CHLORIDE: 102 meq/L (ref 96–112)
CO2: 23 meq/L (ref 19–32)
CREATININE: 0.75 mg/dL (ref 0.50–1.10)
Calcium: 9.2 mg/dL (ref 8.4–10.5)
GFR calc Af Amer: >90 mL/min (ref >90)
GFR calc non Af Amer: >90 mL/min (ref >90)
Glucose, Bld: 100 mg/dL — ABNORMAL HIGH (ref 70–99)
Potassium: 3.5 mEq/L — ABNORMAL LOW (ref 3.7–5.3)
Sodium: 139 mEq/L (ref 137–147)

## 2013-06-06 LAB — CBC WITH DIFFERENTIAL/PLATELET
Basophils Absolute: 0 10*3/uL (ref 0.0–0.1)
Basophils Relative: 1 % (ref 0–1)
Eosinophils Absolute: 0 10*3/uL (ref 0.0–0.7)
Eosinophils Relative: 1 % (ref 0–5)
HEMATOCRIT: 27.9 % — AB (ref 36.0–46.0)
Hemoglobin: 8.9 g/dL — ABNORMAL LOW (ref 12.0–15.0)
Lymphocytes Relative: 83 % — ABNORMAL HIGH (ref 12–46)
Lymphs Abs: 1.5 10*3/uL (ref 0.7–4.0)
MCH: 23.5 pg — AB (ref 26.0–34.0)
MCHC: 31.9 g/dL (ref 30.0–36.0)
MCV: 73.6 fL — AB (ref 78.0–100.0)
MONO ABS: 0.3 10*3/uL (ref 0.1–1.0)
MONOS PCT: 15 % — AB (ref 3–12)
NEUTROS ABS: 0 10*3/uL — AB (ref 1.7–7.7)
Neutrophils Relative %: 0 % — ABNORMAL LOW (ref 43–77)
Platelets: 345 10*3/uL (ref 150–400)
RBC: 3.79 MIL/uL — ABNORMAL LOW (ref 3.87–5.11)
RDW: 16.8 % — AB (ref 11.5–15.5)
WBC: 1.8 10*3/uL — AB (ref 4.0–10.5)

## 2013-06-06 MED ORDER — ENOXAPARIN SODIUM 40 MG/0.4ML ~~LOC~~ SOLN
40.0000 mg | SUBCUTANEOUS | Status: DC
  Administered 2013-06-08: 40 mg via SUBCUTANEOUS
  Filled 2013-06-06 (×4): qty 0.4

## 2013-06-06 NOTE — Progress Notes (Signed)
ANTIBIOTIC CONSULT NOTE - FOLLOW UP  Pharmacy Consult for cefepime Indication: febrile neutropenia  Allergies  Allergen Reactions  . Ciprofloxacin Itching  . Lipitor [Atorvastatin] Swelling    Patient Measurements: Height: 5\' 6"  (167.6 cm) Weight: 164 lb 14.5 oz (74.8 kg) IBW/kg (Calculated) : 59.3  Vital Signs: Temp: 98.4 F (36.9 C) (03/17 0502) Temp src: Oral (03/17 0502) BP: 138/83 mmHg (03/17 0502) Pulse Rate: 76 (03/17 0502) Intake/Output from previous day: 03/16 0701 - 03/17 0700 In: 1160 [P.O.:360; IV Piggyback:800] Out: 3 [Urine:3] Intake/Output from this shift:    Labs:  Recent Labs  06/04/13 0237 06/05/13 0401 06/06/13 0643  WBC 1.4* 2.5* 1.8*  HGB 9.1* 9.0* 8.9*  PLT 388 365 345  CREATININE 0.81  --  0.75   Estimated Creatinine Clearance: 88 ml/min (by C-G formula based on Cr of 0.75). No results found for this basename: VANCOTROUGH, VANCOPEAK, VANCORANDOM, GENTTROUGH, GENTPEAK, GENTRANDOM, TOBRATROUGH, TOBRAPEAK, TOBRARND, AMIKACINPEAK, AMIKACINTROU, AMIKACIN,  in the last 72 hours   Microbiology: Recent Results (from the past 720 hour(s))  TECHNOLOGIST REVIEW     Status: None   Collection Time    05/23/13  9:38 AM      Result Value Ref Range Status   Technologist Review Variant lymphs present   Final  CULTURE, BLOOD (ROUTINE X 2)     Status: None   Collection Time    06/02/13  8:28 PM      Result Value Ref Range Status   Specimen Description BLOOD BLOOD RIGHT FOREARM   Final   Special Requests BOTTLES DRAWN AEROBIC AND ANAEROBIC 3CC   Final   Culture  Setup Time     Final   Value: 06/03/2013 00:45     Performed at [facility]   Culture     Final   Value:        BLOOD CULTURE RECEIVED NO GROWTH TO DATE CULTURE WILL BE HELD FOR 5 DAYS BEFORE ISSUING A FINAL NEGATIVE REPORT     Performed at [facility]   Report Status PENDING   Incomplete  CULTURE, BLOOD (ROUTINE X 2)     Status: None   Collection Time    06/02/13  8:28 PM       Result Value Ref Range Status   Specimen Description BLOOD BLOOD RIGHT FOREARM   Final   Special Requests BOTTLES DRAWN AEROBIC AND ANAEROBIC 4CC   Final   Culture  Setup Time     Final   Value: 06/03/2013 00:46     Performed at [facility]   Culture     Final   Value:        BLOOD CULTURE RECEIVED NO GROWTH TO DATE CULTURE WILL BE HELD FOR 5 DAYS BEFORE ISSUING A FINAL NEGATIVE REPORT     Performed at [facility]   Report Status PENDING   Incomplete  AFB CULTURE, BLOOD     Status: None   Collection Time    06/02/13  9:04 PM      Result Value Ref Range Status   Specimen Description BLOOD RIGHT ARM   Final   Special Requests NONE   Final   Culture     Final   Value: CULTURE WILL BE EXAMINED FOR 6 WEEKS BEFORE ISSUING A FINAL REPORT     Performed at [facility]   Report Status PENDING   Incomplete  URINE CULTURE     Status: None   Collection Time  06/02/13  9:10 PM      Result Value Ref Range Status   Specimen Description URINE, CLEAN CATCH   Final   Special Requests NONE   Final   Culture  Setup Time     Final   Value: 06/03/2013 01:42     Performed at [facility]     Final   Value: 30,000 COLONIES/ML     Performed at [facility]   Culture     Final   Value: Multiple bacterial morphotypes present, none predominant. Suggest appropriate recollection if clinically indicated.     Performed at [facility]   Report Status 06/04/2013 FINAL   Final  MRSA PCR SCREENING     Status: None   Collection Time    06/03/13  3:45 AM      Result Value Ref Range Status   MRSA by PCR NEGATIVE  NEGATIVE Final   Comment:            The GeneXpert MRSA Assay (FDA     approved for NASAL specimens     only), is one component of a     comprehensive MRSA colonization     surveillance program. It is not     intended to diagnose MRSA     infection nor to guide or     monitor treatment for     MRSA infections.     Anti-infectives   Start     Dose/Rate Route Frequency Ordered Stop   06/03/13 0600  clindamycin (CLEOCIN) IVPB 600 mg     600 mg 100 mL/hr over 30 Minutes Intravenous 3 times per day 06/03/13 0036     06/03/13 0400  ceFEPIme (MAXIPIME) 2 g in dextrose 5 % 50 mL IVPB     2 g 100 mL/hr over 30 Minutes Intravenous Every 8 hours 06/02/13 2020     06/02/13 2045  clindamycin (CLEOCIN) IVPB 600 mg     600 mg 100 mL/hr over 30 Minutes Intravenous  Once 06/02/13 2038 06/02/13 2336   06/02/13 2015  ceFEPIme (MAXIPIME) 2 g in dextrose 5 % 50 mL IVPB     2 g 100 mL/hr over 30 Minutes Intravenous STAT 06/02/13 2012 06/02/13 2212      Assessment: 48 YOF with hx of neck swelling x3 months and with difficulty swallowing, started on broad spectrum abx for neutropenic fever. ID is following and recommends continuing IV abx until d/c and then transitioning to PO.  SCr 0.75, CrCl ~85-29mL/min- stable. WBC low- Onc was consulted and G-CSF was started. Now afebrile.   Goal of Therapy:  Eradication of infection  Plan:  1. Continue cefepime 2g IV q8h 2. Continue clindamycin 600mg  IV q8h per MD 3. Follow renal function, discharge planning, c/s  Zaliah Wissner D. Machaela Caterino, PharmD, BCPS Clinical Pharmacist Pager: [phone] 06/06/2013 2:28 PM

## 2013-06-06 NOTE — Progress Notes (Signed)
[facility] PROGRESS NOTE  Morgan Bean OAC:[id number] DOB: 02/12/1966 DOA: 06/02/2013 PCP: [name], MD  Interim Summary Patient is a pleasant 49 year old female with a past medical history of pancytopenia who is being evaluated at the [facility], undergoing bone marrow biopsy which came back negative. She was recently diagnosed with syphilis and started on doxycycline therapy. Patient was admitted to the medicine service on 06/02/2013 presenting with neck swelling and sore throat. A CT scan of the neck performed on 06/02/2013 revealed extensive edematous changes throughout the oropharynx, hypopharynx and larynx compatible with diffuse pharyngitis. There is also prominent swelling of the epiglottis. Labs revealed the presence of neutropenia as she was started on broad-spectrum empiric IV antibiotic therapy with cefepime 2 g IV every 8 hours clindamycin 600 mg IV every 8 hours and vancomycin IV. Patient was seen and evaluated by ENT as if she underwent transnasal fiberoptic lower endoscopy on 06/02/2013 which showed thickened epiglottis with arytenoids edematous and overhanging the glottis. She reported feeling some improvement on the following day as her diet was advanced to clears. ID recommended hematology consult for role of GCSF in this setting. I discussed case with Dr.Shadad who felt it would be reasonable to treat with Neulasta 300 mcg daily. By 06/04/13 she reported ongoing improvement with stability of airway. Transfer orders written for Med/Surg. By 06/05/2013 she reported feeling well, tolerating by mouth intake. Infectious disease recommending bone marrow biopsy for AFB smear and cultures, fungal smear and culture.  Plan: ID recommending GCSF until [place] >500 for 2 days. Order placed for BM biopsy, to check AFB smear, culture and stains. Also checking Fungal smear, culture and stains per ID recs.     Assessment/Plan: 1. Acute supraglottitis, Resolved.  ID recommended continuing  IV antibiotics until discharge. 2. Neutropenia. Patient with history of neutropenia who has been seen by hematology at the [place] . She had a bone marrow biopsy performed in January of 2015 which was unrevealing. Infectious disease recommending bone marrow biopsy for AFB smear and culture, fungal smear and culture. ID recommending GCSF until [place] >500 for 2 days.  3. History of syphilis. Infectious disease consulted. It's possible that doxycycline may have lead to worsening neutropenia. This agent was discontinued on admission. 4. History of hypertension. Her blood pressures are stable with systolic blood pressures in the 120s. She remains on Lasix 20 mg daily.  Code Status: Full Code Family Communication: Disposition Plan: Will continue IV antibiotics, steroids, Neupogen, followup on a.m. CBC   Consultants:  Infectious disease  ENT  Telephone consultation made to hematology  Procedures:  Transnasal fiberoptic laryngoscopy performed on 06/02/2048  Antibiotics:  Cefepime 2 g  IV every 8 hours  Clindamycin 600 mg every 8 hours  HPI/Subjective:    Objective: Filed Vitals:   06/06/13 1452  BP: 114/70  Pulse: 78  Temp: 98.2 F (36.8 C)  Resp: 16    Intake/Output Summary (Last 24 hours) at 06/06/13 1543 Last data filed at 06/06/13 0539  Gross per 24 hour  Intake    860 ml  Output      1 ml  Net    859 ml   Filed Weights   06/04/13 0435 06/05/13 0639 06/06/13 0502  Weight: 73.8 kg (162 lb 11.2 oz) 74.481 kg (164 lb 3.2 oz) 74.8 kg (164 lb 14.5 oz)    Exam:   General:  Patient does not appear to be in acute distress, she seems to be breathing comfortably, I did not note obvious stridor  or use of accessory muscles.  Cardiovascular: Regular rate rhythm  Respiratory: Coarse respiratory sounds, having normal respiratory effort  Abdomen: Soft nontender nondistended  Musculoskeletal: No edema  Data Reviewed: Basic Metabolic Panel:  Recent Labs Lab  06/02/13 1800 06/03/13 0338 06/04/13 0237 06/06/13 0643  NA 132* 133* 139 139  K 3.8 3.3* 4.1 3.5*  CL 95* 97 99 102  CO2 _0 GLUCOSE 100* 106* 101* 100*  BUN _1 CREATININE 1.03 0.93 0.81 0.75  CALCIUM 9.4 9.1 10.0 9.2   Liver Function Tests: No results found for this basename: AST, ALT, ALKPHOS, BILITOT, PROT, ALBUMIN,  in the last 168 hours No results found for this basename: LIPASE, AMYLASE,  in the last 168 hours No results found for this basename: AMMONIA,  in the last 168 hours CBC:  Recent Labs Lab 06/02/13 1800 06/03/13 0338 06/04/13 0237 06/05/13 0401 06/06/13 0643  WBC 1.5* 1.6* 1.4* 2.5* 1.8*  NEUTROABS 0.0*  --   --  0.0* 0.0*  HGB 8.5* 8.3* 9.1* 9.0* 8.9*  HCT 26.7* 26.5* 28.0* 28.2* 27.9*  MCV 74.0* 75.3* 73.7* 73.2* 73.6*  PLT 347 297 388 365 345   Cardiac Enzymes:  Recent Labs Lab 06/02/13 1800  TROPONINI <0.30   BNP (last 3 results)  Recent Labs  04/16/13 1616 05/02/13 1010 06/02/13 1800  PROBNP 415.6* 132.0* 500.8*   CBG: No results found for this basename: GLUCAP,  in the last 168 hours  Recent Results (from the past 240 hour(s))  CULTURE, BLOOD (ROUTINE X 2)     Status: None   Collection Time    06/02/13  8:28 PM      Result Value Ref Range Status   Specimen Description BLOOD BLOOD RIGHT FOREARM   Final   Special Requests BOTTLES DRAWN AEROBIC AND ANAEROBIC 3CC   Final   Culture  Setup Time     Final   Value: 06/03/2013 00:45     Performed at [facility]   Culture     Final   Value:        BLOOD CULTURE RECEIVED NO GROWTH TO DATE CULTURE WILL BE HELD FOR 5 DAYS BEFORE ISSUING A FINAL NEGATIVE REPORT     Performed at [facility]   Report Status PENDING   Incomplete  CULTURE, BLOOD (ROUTINE X 2)     Status: None   Collection Time    06/02/13  8:28 PM      Result Value Ref Range Status   Specimen Description BLOOD BLOOD RIGHT FOREARM   Final   Special Requests BOTTLES DRAWN AEROBIC AND  ANAEROBIC 4CC   Final   Culture  Setup Time     Final   Value: 06/03/2013 00:46     Performed at [facility]   Culture     Final   Value:        BLOOD CULTURE RECEIVED NO GROWTH TO DATE CULTURE WILL BE HELD FOR 5 DAYS BEFORE ISSUING A FINAL NEGATIVE REPORT     Performed at [facility]   Report Status PENDING   Incomplete  AFB CULTURE, BLOOD     Status: None   Collection Time    06/02/13  9:04 PM      Result Value Ref Range Status   Specimen Description BLOOD RIGHT ARM   Final   Special Requests NONE   Final   Culture     Final   Value: CULTURE  WILL BE EXAMINED FOR 6 WEEKS BEFORE ISSUING A FINAL REPORT     Performed at [facility]   Report Status PENDING   Incomplete  URINE CULTURE     Status: None   Collection Time    06/02/13  9:10 PM      Result Value Ref Range Status   Specimen Description URINE, CLEAN CATCH   Final   Special Requests NONE   Final   Culture  Setup Time     Final   Value: 06/03/2013 01:42     Performed at [place]     Final   Value: 30,000 COLONIES/ML     Performed at [facility]   Culture     Final   Value: Multiple bacterial morphotypes present, none predominant. Suggest appropriate recollection if clinically indicated.     Performed at [facility]   Report Status 06/04/2013 FINAL   Final  MRSA PCR SCREENING     Status: None   Collection Time    06/03/13  3:45 AM      Result Value Ref Range Status   MRSA by PCR NEGATIVE  NEGATIVE Final   Comment:            The GeneXpert MRSA Assay (FDA     approved for NASAL specimens     only), is one component of a     comprehensive MRSA colonization     surveillance program. It is not     intended to diagnose MRSA     infection nor to guide or     monitor treatment for     MRSA infections.     Studies: No results found.  Scheduled Meds: . aspirin  300 mg Rectal Daily  . ceFEPime (MAXIPIME) IV  2 g Intravenous Q8H  . clindamycin  (CLEOCIN) IV  600 mg Intravenous 3 times per day  . dexamethasone  20 mg Intravenous Q24H  . enoxaparin (LOVENOX) injection  40 mg Subcutaneous Q24H  . furosemide  20 mg Intravenous Daily  . Tbo-Filgrastim  300 mcg Subcutaneous Daily   Continuous Infusions:   Active Problems:   Lymphadenopathy   Neutropenic fever   Protein-calorie malnutrition, severe    Time spent: 35 min    Kelvin Cellar  [facility] Pager [phone]. If 7PM-7AM, please contact night-coverage at [email or web address], password [facility] 06/06/2013, 3:43 PM  LOS: 4 days

## 2013-06-07 ENCOUNTER — Ambulatory Visit: Payer: No Typology Code available for payment source | Admitting: Infectious Disease

## 2013-06-07 ENCOUNTER — Other Ambulatory Visit: Payer: No Typology Code available for payment source

## 2013-06-07 ENCOUNTER — Inpatient Hospital Stay (HOSPITAL_COMMUNITY): Payer: No Typology Code available for payment source

## 2013-06-07 ENCOUNTER — Ambulatory Visit: Payer: No Typology Code available for payment source | Admitting: Hematology and Oncology

## 2013-06-07 ENCOUNTER — Encounter (HOSPITAL_COMMUNITY): Payer: Self-pay | Admitting: Radiology

## 2013-06-07 DIAGNOSIS — D638 Anemia in other chronic diseases classified elsewhere: Secondary | ICD-10-CM

## 2013-06-07 DIAGNOSIS — R630 Anorexia: Secondary | ICD-10-CM

## 2013-06-07 DIAGNOSIS — K1230 Oral mucositis (ulcerative), unspecified: Secondary | ICD-10-CM

## 2013-06-07 DIAGNOSIS — R5383 Other fatigue: Secondary | ICD-10-CM

## 2013-06-07 DIAGNOSIS — R5082 Postprocedural fever: Secondary | ICD-10-CM

## 2013-06-07 DIAGNOSIS — R5381 Other malaise: Secondary | ICD-10-CM

## 2013-06-07 DIAGNOSIS — D4959 Neoplasm of unspecified behavior of other genitourinary organ: Secondary | ICD-10-CM

## 2013-06-07 DIAGNOSIS — K121 Other forms of stomatitis: Secondary | ICD-10-CM

## 2013-06-07 DIAGNOSIS — R221 Localized swelling, mass and lump, neck: Secondary | ICD-10-CM

## 2013-06-07 DIAGNOSIS — R220 Localized swelling, mass and lump, head: Secondary | ICD-10-CM

## 2013-06-07 DIAGNOSIS — D759 Disease of blood and blood-forming organs, unspecified: Secondary | ICD-10-CM

## 2013-06-07 LAB — CBC WITH DIFFERENTIAL/PLATELET
BAND NEUTROPHILS: 0 % (ref 0–10)
BLASTS: 0 %
Basophils Absolute: 0 10*3/uL (ref 0.0–0.1)
Basophils Relative: 0 % (ref 0–1)
Eosinophils Absolute: 0 10*3/uL (ref 0.0–0.7)
Eosinophils Relative: 0 % (ref 0–5)
HEMATOCRIT: 27.6 % — AB (ref 36.0–46.0)
Hemoglobin: 8.7 g/dL — ABNORMAL LOW (ref 12.0–15.0)
Lymphocytes Relative: 95 % — ABNORMAL HIGH (ref 12–46)
Lymphs Abs: 2.6 10*3/uL (ref 0.7–4.0)
MCH: 23.3 pg — ABNORMAL LOW (ref 26.0–34.0)
MCHC: 31.5 g/dL (ref 30.0–36.0)
MCV: 74 fL — AB (ref 78.0–100.0)
METAMYELOCYTES PCT: 0 %
Monocytes Absolute: 0.1 10*3/uL (ref 0.1–1.0)
Monocytes Relative: 4 % (ref 3–12)
Myelocytes: 0 %
Neutro Abs: 0 10*3/uL — ABNORMAL LOW (ref 1.7–7.7)
Neutrophils Relative %: 1 % — ABNORMAL LOW (ref 43–77)
PROMYELOCYTES ABS: 0 %
Platelets: 314 10*3/uL (ref 150–400)
RBC: 3.73 MIL/uL — ABNORMAL LOW (ref 3.87–5.11)
RDW: 16.8 % — AB (ref 11.5–15.5)
WBC: 2.7 10*3/uL — ABNORMAL LOW (ref 4.0–10.5)
nRBC: 0 /100 WBC

## 2013-06-07 LAB — PROTIME-INR
INR: 0.99 (ref 0.00–1.49)
PROTHROMBIN TIME: 12.9 s (ref 11.6–15.2)

## 2013-06-07 LAB — PREGNANCY, URINE: Preg Test, Ur: NEGATIVE

## 2013-06-07 LAB — APTT: aPTT: 20 seconds — ABNORMAL LOW (ref 24–37)

## 2013-06-07 LAB — BONE MARROW EXAM

## 2013-06-07 MED ORDER — ASPIRIN 325 MG PO TABS
325.0000 mg | ORAL_TABLET | Freq: Every day | ORAL | Status: DC
  Administered 2013-06-07 – 2013-06-09 (×3): 325 mg via ORAL
  Filled 2013-06-07 (×4): qty 1

## 2013-06-07 MED ORDER — FENTANYL CITRATE 0.05 MG/ML IJ SOLN
INTRAMUSCULAR | Status: AC | PRN
  Administered 2013-06-07 (×4): 50 ug via INTRAVENOUS

## 2013-06-07 MED ORDER — MIDAZOLAM HCL 2 MG/2ML IJ SOLN
INTRAMUSCULAR | Status: AC
  Filled 2013-06-07: qty 4

## 2013-06-07 MED ORDER — MIDAZOLAM HCL 2 MG/2ML IJ SOLN
INTRAMUSCULAR | Status: AC | PRN
  Administered 2013-06-07: 2 mg via INTRAVENOUS
  Administered 2013-06-07 (×2): 1 mg via INTRAVENOUS

## 2013-06-07 MED ORDER — POTASSIUM CHLORIDE CRYS ER 20 MEQ PO TBCR
20.0000 meq | EXTENDED_RELEASE_TABLET | Freq: Every day | ORAL | Status: DC
  Administered 2013-06-08 – 2013-06-09 (×2): 20 meq via ORAL
  Filled 2013-06-07 (×3): qty 1

## 2013-06-07 MED ORDER — FENTANYL CITRATE 0.05 MG/ML IJ SOLN
INTRAMUSCULAR | Status: AC
  Filled 2013-06-07: qty 4

## 2013-06-07 MED ORDER — LIDOCAINE HCL 1 % IJ SOLN
INTRAMUSCULAR | Status: AC
  Filled 2013-06-07: qty 10

## 2013-06-07 MED ORDER — ENSURE COMPLETE PO LIQD
237.0000 mL | Freq: Two times a day (BID) | ORAL | Status: DC
  Administered 2013-06-08: 237 mL via ORAL

## 2013-06-07 MED ORDER — TBO-FILGRASTIM 480 MCG/0.8ML ~~LOC~~ SOSY
480.0000 ug | PREFILLED_SYRINGE | Freq: Every day | SUBCUTANEOUS | Status: DC
  Administered 2013-06-07 – 2013-06-08 (×2): 480 ug via SUBCUTANEOUS
  Filled 2013-06-07 (×2): qty 0.8
  Filled 2013-06-07: qty 1

## 2013-06-07 NOTE — Procedures (Signed)
CT guided bone marrow biopsy.  No immediate complication.  Additional core obtained for culture.

## 2013-06-07 NOTE — Progress Notes (Signed)
[facility] for Infectious Disease  Date of Admission:  06/02/2013  Antibiotics: Cefepime, clinda  Subjective: Getting BMBx now in IR  Objective: Temp:  [97.6 F (36.4 C)-98.2 F (36.8 C)] 97.6 F (36.4 C) (03/18 1301) Pulse Rate:  [62-92] 62 (03/18 1301) Resp:  [8-26] 14 (03/18 1301) BP: (114-160)/(70-110) 138/93 mmHg (03/18 1301) SpO2:  [99 %-100 %] 100 % (03/18 1301) Weight:  [164 lb 7 oz (74.587 kg)] 164 lb 7 oz (74.587 kg) (03/18 0556)  General: unable to assess  Lab Results Lab Results  Component Value Date   WBC 2.7* 06/07/2013   HGB 8.7* 06/07/2013   HCT 27.6* 06/07/2013   MCV 74.0* 06/07/2013   PLT 314 06/07/2013    Lab Results  Component Value Date   CREATININE 0.75 06/06/2013   BUN 20 06/06/2013   NA 139 06/06/2013   K 3.5* 06/06/2013   CL 102 06/06/2013   CO2 23 06/06/2013    Lab Results  Component Value Date   ALT <6 05/30/2013   AST 8 05/30/2013   ALKPHOS 73 05/30/2013   BILITOT 0.27 05/30/2013      Microbiology: Recent Results (from the past 240 hour(s))  CULTURE, BLOOD (ROUTINE X 2)     Status: None   Collection Time    06/02/13  8:28 PM      Result Value Ref Range Status   Specimen Description BLOOD BLOOD RIGHT FOREARM   Final   Special Requests BOTTLES DRAWN AEROBIC AND ANAEROBIC 3CC   Final   Culture  Setup Time     Final   Value: 06/03/2013 00:45     Performed at [facility]   Culture     Final   Value:        BLOOD CULTURE RECEIVED NO GROWTH TO DATE CULTURE WILL BE HELD FOR 5 DAYS BEFORE ISSUING A FINAL NEGATIVE REPORT     Performed at [facility]   Report Status PENDING   Incomplete  CULTURE, BLOOD (ROUTINE X 2)     Status: None   Collection Time    06/02/13  8:28 PM      Result Value Ref Range Status   Specimen Description BLOOD BLOOD RIGHT FOREARM   Final   Special Requests BOTTLES DRAWN AEROBIC AND ANAEROBIC 4CC   Final   Culture  Setup Time     Final   Value: 06/03/2013 00:46     Performed at [facility]   Culture     Final   Value:        BLOOD CULTURE RECEIVED NO GROWTH TO DATE CULTURE WILL BE HELD FOR 5 DAYS BEFORE ISSUING A FINAL NEGATIVE REPORT     Performed at [facility]   Report Status PENDING   Incomplete  AFB CULTURE, BLOOD     Status: None   Collection Time    06/02/13  9:04 PM      Result Value Ref Range Status   Specimen Description BLOOD RIGHT ARM   Final   Special Requests NONE   Final   Culture     Final   Value: CULTURE WILL BE EXAMINED FOR 6 WEEKS BEFORE ISSUING A FINAL REPORT     Performed at [facility]   Report Status PENDING   Incomplete  URINE CULTURE     Status: None   Collection Time    06/02/13  9:10 PM      Result Value Ref Range Status  Specimen Description URINE, CLEAN CATCH   Final   Special Requests NONE   Final   Culture  Setup Time     Final   Value: 06/03/2013 01:42     Performed at [place]     Final   Value: 30,000 COLONIES/ML     Performed at [facility]   Culture     Final   Value: Multiple bacterial morphotypes present, none predominant. Suggest appropriate recollection if clinically indicated.     Performed at [facility]   Report Status 06/04/2013 FINAL   Final  MRSA PCR SCREENING     Status: None   Collection Time    06/03/13  3:45 AM      Result Value Ref Range Status   MRSA by PCR NEGATIVE  NEGATIVE Final   Comment:            The GeneXpert MRSA Assay (FDA     approved for NASAL specimens     only), is one component of a     comprehensive MRSA colonization     surveillance program. It is not     intended to diagnose MRSA     infection nor to guide or     monitor treatment for     MRSA infections.    Studies/Results: No results found.  Assessment/Plan: 1) neutropenia with bone marrow suppression, unknown etiology.  BM today, ? AFB or fungal.  Will await stains, smear, culture.  No changes at this time.    Scharlene Gloss, Page for  Infectious Disease [place] [email or web address] [phone] pager   [phone] cell 06/07/2013, 1:16 PM

## 2013-06-07 NOTE — Progress Notes (Addendum)
[facility] PROGRESS NOTE  Morgan Bean GHW:[id number] DOB: [birth year] DOA: 06/02/2013 PCP: [name], MD  Interim Summary Patient is a pleasant 48 year old female with a past medical history of pancytopenia who is being evaluated at the [facility], undergoing bone marrow biopsy which came back negative. She was recently diagnosed with syphilis and started on doxycycline therapy. Patient was admitted to the medicine service on 06/02/2013 presenting with neck swelling and sore throat. A CT scan of the neck performed on 06/02/2013 revealed extensive edematous changes throughout the oropharynx, hypopharynx and larynx compatible with diffuse pharyngitis. There is also prominent swelling of the epiglottis. Labs revealed the presence of neutropenia as she was started on broad-spectrum empiric IV antibiotic therapy with cefepime 2 g IV every 8 hours clindamycin 600 mg IV every 8 hours and vancomycin IV. Patient was seen and evaluated by ENT as if she underwent transnasal fiberoptic lower endoscopy on 06/02/2013 which showed thickened epiglottis with arytenoids edematous and overhanging the glottis. She reported feeling some improvement on the following day as her diet was advanced to clears. ID recommended hematology consult for role of GCSF in this setting. I discussed case with Dr.Shadad who felt it would be reasonable to treat with Neulasta 300 mcg daily. By 06/04/13 she reported ongoing improvement with stability of airway. Transfer orders written for Med/Surg. By 06/05/2013 she reported feeling well, tolerating by mouth intake. Infectious disease recommending bone marrow biopsy for AFB smear and cultures, fungal smear and culture.  Plan: ID recommending GCSF until [place] >500 for 2 days. Order placed for BM biopsy, to check AFB smear, culture and stains. Also checking Fungal smear, culture and stains per ID recs.     Assessment/Plan: Acute supraglottitis, Resolved.  ID recommended continuing IV  antibiotics until discharge.- change to PO at that time  Neutropenia. Etiology unclear.  Patient with history of neutropenia who has been seen by hematology at the [place] . She had a bone marrow biopsy performed in January of 2015 which was unrevealing. Infectious disease recommending bone marrow biopsy for AFB smear and culture, fungal smear and culture. ID recommending GCSF until [place] >500 for 2 days.  -for BMB 3/18  History of syphilis. Infectious disease consulted. Doxy d/c'd    History of hypertension. Her blood pressures are stable with systolic blood pressures in the 120s. She remains on Lasix 20 mg daily.  Code Status: Full Code Family Communication: patient at bedside Disposition Plan: Will continue IV antibiotics, steroids, Neupogen, followup on a.m. CBC -If patient d/c'd on Friday, Dr. Alvy Bimler will follow up early next week on the culture   Consultants:  Infectious disease  ENT  Telephone consultation made to hematology  Procedures:  Transnasal fiberoptic laryngoscopy performed on 06/02/2048  Antibiotics:  Cefepime 2 g  IV every 8 hours  Clindamycin 600 mg every 8 hours  HPI/Subjective: No c/o No CP, SOB   Objective: Filed Vitals:   06/07/13 0556  BP: 138/97  Pulse: 80  Temp: 98.2 F (36.8 C)  Resp: 19    Intake/Output Summary (Last 24 hours) at 06/07/13 0927 Last data filed at 06/07/13 0600  Gross per 24 hour  Intake    540 ml  Output      0 ml  Net    540 ml   Filed Weights   06/05/13 0639 06/06/13 0502 06/07/13 0556  Weight: 74.481 kg (164 lb 3.2 oz) 74.8 kg (164 lb 14.5 oz) 74.587 kg (164 lb 7 oz)    Exam:   General:  Patient does not appear to be in acute distress, she seems to be breathing comfortably, I did not note obvious stridor or use of accessory muscles.  Cardiovascular: Regular rate rhythm  Respiratory: Coarse respiratory sounds, having normal respiratory effort  Abdomen: Soft nontender  nondistended  Musculoskeletal: No edema  Data Reviewed: Basic Metabolic Panel:  Recent Labs Lab 06/02/13 1800 06/03/13 0338 06/04/13 0237 06/06/13 0643  NA 132* 133* 139 139  K 3.8 3.3* 4.1 3.5*  CL 95* 97 99 102  CO2 _0 GLUCOSE 100* 106* 101* 100*  BUN _1 CREATININE 1.03 0.93 0.81 0.75  CALCIUM 9.4 9.1 10.0 9.2   Liver Function Tests: No results found for this basename: AST, ALT, ALKPHOS, BILITOT, PROT, ALBUMIN,  in the last 168 hours No results found for this basename: LIPASE, AMYLASE,  in the last 168 hours No results found for this basename: AMMONIA,  in the last 168 hours CBC:  Recent Labs Lab 06/02/13 1800 06/03/13 0338 06/04/13 0237 06/05/13 0401 06/06/13 0643 06/07/13 0500  WBC 1.5* 1.6* 1.4* 2.5* 1.8* 2.7*  NEUTROABS 0.0*  --   --  0.0* 0.0* 0.0*  HGB 8.5* 8.3* 9.1* 9.0* 8.9* 8.7*  HCT 26.7* 26.5* 28.0* 28.2* 27.9* 27.6*  MCV 74.0* 75.3* 73.7* 73.2* 73.6* 74.0*  PLT 347 297 388 365 345 314   Cardiac Enzymes:  Recent Labs Lab 06/02/13 1800  TROPONINI <0.30   BNP (last 3 results)  Recent Labs  04/16/13 1616 05/02/13 1010 06/02/13 1800  PROBNP 415.6* 132.0* 500.8*   CBG: No results found for this basename: GLUCAP,  in the last 168 hours  Recent Results (from the past 240 hour(s))  CULTURE, BLOOD (ROUTINE X 2)     Status: None   Collection Time    06/02/13  8:28 PM      Result Value Ref Range Status   Specimen Description BLOOD BLOOD RIGHT FOREARM   Final   Special Requests BOTTLES DRAWN AEROBIC AND ANAEROBIC 3CC   Final   Culture  Setup Time     Final   Value: 06/03/2013 00:45     Performed at [facility]   Culture     Final   Value:        BLOOD CULTURE RECEIVED NO GROWTH TO DATE CULTURE WILL BE HELD FOR 5 DAYS BEFORE ISSUING A FINAL NEGATIVE REPORT     Performed at [facility]   Report Status PENDING   Incomplete  CULTURE, BLOOD (ROUTINE X 2)     Status: None   Collection Time    06/02/13  8:28  PM      Result Value Ref Range Status   Specimen Description BLOOD BLOOD RIGHT FOREARM   Final   Special Requests BOTTLES DRAWN AEROBIC AND ANAEROBIC 4CC   Final   Culture  Setup Time     Final   Value: 06/03/2013 00:46     Performed at [facility]   Culture     Final   Value:        BLOOD CULTURE RECEIVED NO GROWTH TO DATE CULTURE WILL BE HELD FOR 5 DAYS BEFORE ISSUING A FINAL NEGATIVE REPORT     Performed at [facility]   Report Status PENDING   Incomplete  AFB CULTURE, BLOOD     Status: None   Collection Time    06/02/13  9:04 PM      Result Value Ref Range Status  Specimen Description BLOOD RIGHT ARM   Final   Special Requests NONE   Final   Culture     Final   Value: CULTURE WILL BE EXAMINED FOR 6 WEEKS BEFORE ISSUING A FINAL REPORT     Performed at [facility]   Report Status PENDING   Incomplete  URINE CULTURE     Status: None   Collection Time    06/02/13  9:10 PM      Result Value Ref Range Status   Specimen Description URINE, CLEAN CATCH   Final   Special Requests NONE   Final   Culture  Setup Time     Final   Value: 06/03/2013 01:42     Performed at [facility]     Final   Value: 30,000 COLONIES/ML     Performed at [facility]   Culture     Final   Value: Multiple bacterial morphotypes present, none predominant. Suggest appropriate recollection if clinically indicated.     Performed at [facility]   Report Status 06/04/2013 FINAL   Final  MRSA PCR SCREENING     Status: None   Collection Time    06/03/13  3:45 AM      Result Value Ref Range Status   MRSA by PCR NEGATIVE  NEGATIVE Final   Comment:            The GeneXpert MRSA Assay (FDA     approved for NASAL specimens     only), is one component of a     comprehensive MRSA colonization     surveillance program. It is not     intended to diagnose MRSA     infection nor to guide or     monitor treatment for     MRSA infections.      Studies: No results found. - Scheduled Meds: . aspirin  300 mg Rectal Daily  . ceFEPime (MAXIPIME) IV  2 g Intravenous Q8H  . clindamycin (CLEOCIN) IV  600 mg Intravenous 3 times per day  . dexamethasone  20 mg Intravenous Q24H  . enoxaparin (LOVENOX) injection  40 mg Subcutaneous Q24H  . furosemide  20 mg Intravenous Daily  . Tbo-Filgrastim  300 mcg Subcutaneous Daily   Continuous Infusions:   Active Problems:   Lymphadenopathy   Neutropenic fever   Protein-calorie malnutrition, severe    Time spent: 35 min    ,   [facility] Pager [phone] 7AM-7AM, please contact night-coverage at [email or web address], password [facility] 06/07/2013, 9:27 AM  LOS: 5 days

## 2013-06-07 NOTE — H&P (Signed)
Chief Complaint: "I need a bone marrow biopsy." Referring Physician: Dr. Coralyn Pear HPI: Morgan Bean is an 48 y.o. female with history of pancytopenia who underwent a bone marrow biopsy in the office with Dr. Alvy Bimler 03/2013 with no significant findings. She presented to [facility] 3/13 with c/o neck swelling, imaging revealed supraglottitis and she is currently on antibiotics. Her labs reveal neutropenia and IR has received request for image guided bone marrow biopsy. She denies any chest pain, shortness of breath or palpitations. She denies any active signs of bleeding or excessive bruising. She denies any recent fever or chills. The patient denies any history of sleep apnea or chronic oxygen use. She has previously tolerated anesthesia without complications.   Past Medical History:  Past Medical History  Diagnosis Date  . Coronary artery disease     Lexiscan myoview (10/12) with significant ST depression upon Lexiscan injection but no ischemia or infarction on perfusion images. Left heart cath (10/12): 30% mid LAD, 30% ostial D1, 50% ostial D2.    [name] Hypertension   . Tricuspid regurgitation   . Iron deficiency     hx of  . Polysubstance abuse     Prior cocaine  . Tobacco abuse   . Leukocytoclastic vasculitis     Rash across lower body, occurred in 9/11, diagnosed by skin biopsy. ANA positive. Thought to be secondary to cocaine use.  . Pulmonary HTN     Echo (9/11) with EF 65%, mild LVH, mild AI, mild MR, moderate to possibly severe TR with PA systolic pressure 52 mmHg. Echo (10/12): severe LV hypertrophy, EF 60-65%, mild MR, mild AI, moderate to severe tricuspid regurgitation, PA systolic pressure 38 mmHg.  Echo 4/14: moderate LVH, EF 65%, normal wall motion, diastolic dysfunction, mild AI, mild MR  . Asthma   . Shortness of breath     a. PFTs 5/14:  FEV1/FVC 99% predicted; FVC 60% predicted; DLCO mildly reduced, mild restriction, air trapping.;  b.  seen by pulmo (Dr. [name]) 5/14: mainly upper airway  symptoms - ACE d/c'd (sx's better)  . Chronic diastolic CHF (congestive heart failure)   . Constipation   . Lymphadenopathy 03/21/2013  . Leukopenia 03/21/2013  . Unspecified deficiency anemia 03/29/2013  . Anginal pain     none in past year  . Anxiety   . Mental disorder   . Depression   . Stroke 2010ish  . Arthritis   . Pancytopenia   . Hyperlipemia   . Syphilis 04/25/2013    Past Surgical History:  Past Surgical History  Procedure Laterality Date  . Ankle surgery      right  . Cardiac catheterization    . I&d extremity  08/09/2011    Procedure: IRRIGATION AND DEBRIDEMENT EXTREMITY;  Surgeon: [name], MD;  Location: [place];  Service: General;  Laterality: Left;  i & D Left axilla abscess  . Lymph node biopsy N/A 04/05/2013    Procedure: EXCISIONAL BIOPSY RIGHT SUBMANDIBULAR NODE, NASAL ENDOSCOPIC WITH BIOPSY NASAL PHARYNX;  Surgeon: [name], MD;  Location: [facility];  Service: ENT;  Laterality: N/A;    Family History:  Family History  Problem Relation Age of Onset  . Coronary artery disease Mother   . Diabetes Mother   . Breast cancer Maternal Grandmother   . Diabetes Maternal Grandmother   . Diabetes Father   . Hypertension Father   . Coronary artery disease Father     Social History:  reports that she quit smoking about 2 weeks ago.  Her smoking use included Cigarettes. She has a 7.5 pack-year smoking history. She has never used smokeless tobacco. She reports that she uses illicit drugs ("Crack" cocaine) about once per week. She reports that she does not drink alcohol.  Allergies:  Allergies  Allergen Reactions  . Ciprofloxacin Itching  . Lipitor [Atorvastatin] Swelling    Medications:   Medication List    ASK your doctor about these medications       albuterol 108 (90 BASE) MCG/ACT inhaler  Commonly known as:  PROVENTIL HFA;VENTOLIN HFA  Inhale 2 puffs into the lungs every 4 (four) hours as needed for wheezing or shortness of breath.     amLODipine  10 MG tablet  Commonly known as:  NORVASC  Take 10 mg by mouth daily.     aspirin 81 MG tablet  Take 81 mg by mouth daily.     buPROPion 100 MG tablet  Commonly known as:  WELLBUTRIN  Take 100 mg by mouth 2 (two) times daily.     citalopram 40 MG tablet  Commonly known as:  CELEXA  Take 40 mg by mouth daily.     cloNIDine 0.3 MG tablet  Commonly known as:  CATAPRES  Take 0.3 mg by mouth 2 (two) times daily.     furosemide 20 MG tablet  Commonly known as:  LASIX  Take 1 tablet (20 mg total) by mouth 2 (two) times daily.     hydrOXYzine 25 MG tablet  Commonly known as:  ATARAX/VISTARIL  Take 25 mg by mouth at bedtime as needed (sleep).     ibuprofen 200 MG tablet  Commonly known as:  ADVIL,MOTRIN  Take 1,000 mg by mouth once as needed for headache.     loratadine 10 MG tablet  Commonly known as:  CLARITIN  Take 1 tablet (10 mg total) by mouth daily.     multivitamin with minerals Tabs tablet  Take 1 tablet by mouth daily.     polyethylene glycol powder powder  Commonly known as:  GLYCOLAX/MIRALAX  Take 17 g by mouth daily as needed (constipation).     potassium chloride 10 MEQ tablet  Commonly known as:  K-DUR,KLOR-CON  Take 1 tablet (10 mEq total) by mouth 2 (two) times daily with a meal.     traZODone 100 MG tablet  Commonly known as:  DESYREL  Take 100 mg by mouth at bedtime.       Please HPI for pertinent positives, otherwise complete 10 system ROS negative.  Physical Exam: BP 138/97  Pulse 80  Temp(Src) 98.2 F (36.8 C) (Oral)  Resp 19  Ht $R'5\' 6"'Id$  (1.676 m)  Wt 164 lb 7 oz (74.587 kg)  BMI 26.55 kg/m2  SpO2 100%  LMP 04/04/2013 Body mass index is 26.55 kg/(m^2).  General Appearance:  Alert, cooperative, no distress  Head:  Normocephalic, without obvious abnormality, atraumatic  Neck: Supple, symmetrical, trachea midline  Lungs:   Clear to auscultation bilaterally, no w/r/r, respirations unlabored without use of accessory muscles.  Chest Wall:  No  tenderness or deformity  Heart:  Regular rate and rhythm, S1, S2 normal, no murmur, rub or gallop.  Abdomen:   Soft, non-tender, non distended, (+) BS  Extremities: Extremities normal, atraumatic, no cyanosis or edema  Pulses: 2+ and symmetric  Neurologic: Normal affect, no gross deficits.   Results for orders placed during the hospital encounter of 06/02/13 (from the past 48 hour(s))  CBC WITH DIFFERENTIAL     Status: Abnormal   Collection  Time    06/06/13  6:43 AM      Result Value Ref Range   WBC 1.8 (*) 4.0 - 10.5 K/uL   Comment: REPEATED TO VERIFY   RBC 3.79 (*) 3.87 - 5.11 MIL/uL   Hemoglobin 8.9 (*) 12.0 - 15.0 g/dL   HCT 27.9 (*) 36.0 - 46.0 %   MCV 73.6 (*) 78.0 - 100.0 fL   MCH 23.5 (*) 26.0 - 34.0 pg   MCHC 31.9  30.0 - 36.0 g/dL   RDW 16.8 (*) 11.5 - 15.5 %   Platelets 345  150 - 400 K/uL   Neutrophils Relative % 0 (*) 43 - 77 %   Lymphocytes Relative 83 (*) 12 - 46 %   Monocytes Relative 15 (*) 3 - 12 %   Eosinophils Relative 1  0 - 5 %   Basophils Relative 1  0 - 1 %   Neutro Abs 0.0 (*) 1.7 - 7.7 K/uL   Lymphs Abs 1.5  0.7 - 4.0 K/uL   Monocytes Absolute 0.3  0.1 - 1.0 K/uL   Eosinophils Absolute 0.0  0.0 - 0.7 K/uL   Basophils Absolute 0.0  0.0 - 0.1 K/uL   Smear Review MORPHOLOGY UNREMARKABLE    BASIC METABOLIC PANEL     Status: Abnormal   Collection Time    06/06/13  6:43 AM      Result Value Ref Range   Sodium 139  137 - 147 mEq/L   Potassium 3.5 (*) 3.7 - 5.3 mEq/L   Chloride 102  96 - 112 mEq/L   CO2 23  19 - 32 mEq/L   Glucose, Bld 100 (*) 70 - 99 mg/dL   BUN 20  6 - 23 mg/dL   Creatinine, Ser 0.75  0.50 - 1.10 mg/dL   Calcium 9.2  8.4 - 10.5 mg/dL   GFR calc non Af Amer >90  >90 mL/min   GFR calc Af Amer >90  >90 mL/min   Comment: (NOTE)     The eGFR has been calculated using the CKD EPI equation.     This calculation has not been validated in all clinical situations.     eGFR's persistently <90 mL/min signify possible Chronic Kidney      Disease.  CBC WITH DIFFERENTIAL     Status: Abnormal   Collection Time    06/07/13  5:00 AM      Result Value Ref Range   WBC 2.7 (*) 4.0 - 10.5 K/uL   RBC 3.73 (*) 3.87 - 5.11 MIL/uL   Hemoglobin 8.7 (*) 12.0 - 15.0 g/dL   HCT 27.6 (*) 36.0 - 46.0 %   MCV 74.0 (*) 78.0 - 100.0 fL   MCH 23.3 (*) 26.0 - 34.0 pg   MCHC 31.5  30.0 - 36.0 g/dL   RDW 16.8 (*) 11.5 - 15.5 %   Platelets 314  150 - 400 K/uL   Neutrophils Relative % 1 (*) 43 - 77 %   Lymphocytes Relative 95 (*) 12 - 46 %   Monocytes Relative 4  3 - 12 %   Eosinophils Relative 0  0 - 5 %   Basophils Relative 0  0 - 1 %   Band Neutrophils 0  0 - 10 %   Metamyelocytes Relative 0     Myelocytes 0     Promyelocytes Absolute 0     Blasts 0     nRBC 0  0 /100 WBC   Neutro Abs 0.0 (*)  1.7 - 7.7 K/uL   Lymphs Abs 2.6  0.7 - 4.0 K/uL   Monocytes Absolute 0.1  0.1 - 1.0 K/uL   Eosinophils Absolute 0.0  0.0 - 0.7 K/uL   Basophils Absolute 0.0  0.0 - 0.1 K/uL  PROTIME-INR     Status: None   Collection Time    06/07/13  5:00 AM      Result Value Ref Range   Prothrombin Time 12.9  11.6 - 15.2 seconds   INR 0.99  0.00 - 1.49  APTT     Status: Abnormal   Collection Time    06/07/13  5:00 AM      Result Value Ref Range   aPTT 20 (*) 24 - 37 seconds   No results found.  Assessment/Plan History of pancytopenia s/p bone marrow biopsy 1/15 no significant findings. Neutropenia, request for image guided bone marrow biopsy. Acute supraglottitis, resolved, on antibiotics. Patient has been NPO, labs reviewed, lovenox held. Risks and Benefits discussed with the patient. All of the patient's questions were answered, patient is agreeable to proceed. Consent signed and in chart.  Tsosie Billing D PA-C 06/07/2013, 7:57 AM

## 2013-06-07 NOTE — Consult Note (Signed)
[facility] NOTE  Patient Care Team: [name], MD as PCP - General (Internal Medicine)  CHIEF COMPLAINTS/PURPOSE OF CONSULTATION:  Pancytopenia  HISTORY OF PRESENTING ILLNESS:  Morgan Bean 48 y.o. female is here because of neutropenic fever, throat swelling. The patient is well-known to me. I have been following her closely for the last few months for workup for lymphadenopathy and severe pancytopenia. Further evaluation brought the suspicion for possible syphilis as the cause of her presentation.[name] She was treated with doxycycline with worsening pancytopenia. Her antibiotic treatment was discontinued. Her case has been presented in previous tumor boards including the hematology tumor board and the ENT tumor board. Since admission, she was started on G-CSF and broad-spectrum IV antibiotics. Overall, she felt minimally improved. Her throat swelling and gum swelling is improving. She felt that the buttock lesions has improved. She still has profound fatigue and loss of appetite. Denies any new lymphadenopathy.  MEDICAL HISTORY:  Past Medical History  Diagnosis Date  . Coronary artery disease     Lexiscan myoview (10/12) with significant ST depression upon Lexiscan injection but no ischemia or infarction on perfusion images. Left heart cath (10/12): 30% mid LAD, 30% ostial D1, 50% ostial D2.    [name] Hypertension   . Tricuspid regurgitation   . Iron deficiency     hx of  . Polysubstance abuse     Prior cocaine  . Tobacco abuse   . Leukocytoclastic vasculitis     Rash across lower body, occurred in 9/11, diagnosed by skin biopsy. ANA positive. Thought to be secondary to cocaine use.  . Pulmonary HTN     Echo (9/11) with EF 65%, mild LVH, mild AI, mild MR, moderate to possibly severe TR with PA systolic pressure 52 mmHg. Echo (10/12): severe LV hypertrophy, EF 60-65%, mild MR, mild AI, moderate to severe tricuspid regurgitation, PA systolic pressure 38 mmHg.   Echo 4/14: moderate LVH, EF 65%, normal wall motion, diastolic dysfunction, mild AI, mild MR  . Asthma   . Shortness of breath     a. PFTs 5/14:  FEV1/FVC 99% predicted; FVC 60% predicted; DLCO mildly reduced, mild restriction, air trapping.;  b.  seen by pulmo (Dr. [name]) 5/14: mainly upper airway symptoms - ACE d/c'd (sx's better)  . Chronic diastolic CHF (congestive heart failure)   . Constipation   . Lymphadenopathy 03/21/2013  . Leukopenia 03/21/2013  . Unspecified deficiency anemia 03/29/2013  . Anginal pain     none in past year  . Anxiety   . Mental disorder   . Depression   . Stroke 2010ish  . Arthritis   . Pancytopenia   . Hyperlipemia   . Syphilis 04/25/2013    SURGICAL HISTORY: Past Surgical History  Procedure Laterality Date  . Ankle surgery      right  . Cardiac catheterization    . I&d extremity  08/09/2011    Procedure: IRRIGATION AND DEBRIDEMENT EXTREMITY;  Surgeon: [name], MD;  Location: [place];  Service: General;  Laterality: Left;  i & D Left axilla abscess  . Lymph node biopsy N/A 04/05/2013    Procedure: EXCISIONAL BIOPSY RIGHT SUBMANDIBULAR NODE, NASAL ENDOSCOPIC WITH BIOPSY NASAL PHARYNX;  Surgeon: [name], MD;  Location: [place];  Service: ENT;  Laterality: N/A;    SOCIAL HISTORY: History   Social History  . Marital Status: Single    Spouse Name: N/A    Number of Children: 2  . Years of Education: N/A  Occupational History  . unemployed    Social History Main Topics  . Smoking status: Former Smoker -- 0.25 packs/day for 30 years    Types: Cigarettes    Quit date: 05/23/2013  . Smokeless tobacco: Never Used     Comment: 5 cigs --- uses E-cig  . Alcohol Use: No     Comment: pint of wine 2-3 times a week--ocassional  . Drug Use: 1.00 per week    Special: "Crack" cocaine     Comment: trying to quit---- quit November 2013   . Sexual Activity: Yes   Other Topics Concern  . Not on file   Social History Narrative   Lives with mom,  sisters and brothers.    FAMILY HISTORY: Family History  Problem Relation Age of Onset  . Coronary artery disease Mother   . Diabetes Mother   . Breast cancer Maternal Grandmother   . Diabetes Maternal Grandmother   . Diabetes Father   . Hypertension Father   . Coronary artery disease Father     ALLERGIES:  is allergic to ciprofloxacin and lipitor.  MEDICATIONS:  Current Facility-Administered Medications  Medication Dose Route Frequency Provider Last Rate Last Dose  . acetaminophen (TYLENOL) suppository 650 mg  650 mg Rectal Q6H PRN Maryann Mikhail, DO      . albuterol (PROVENTIL) (2.5 MG/3ML) 0.083% nebulizer solution 3 mL  3 mL Inhalation Q4H PRN Maryann Mikhail, DO      . aspirin tablet 325 mg  325 mg Oral Daily Jessica U Vann, DO      . ceFEPIme (MAXIPIME) 2 g in dextrose 5 % 50 mL IVPB  2 g Intravenous Q8H Posey Rea, RPH   2 g at 06/07/13 0359  . clindamycin (CLEOCIN) IVPB 600 mg  600 mg Intravenous 3 times per day Cristal Ford, DO   600 mg at 06/07/13 0510  . dexamethasone (DECADRON) injection 20 mg  20 mg Intravenous Q24H Maryann Mikhail, DO   20 mg at 06/07/13 0511  . enoxaparin (LOVENOX) injection 40 mg  40 mg Subcutaneous Q24H Koreen D Morgan, PA-C      . fentaNYL (SUBLIMAZE) 0.05 MG/ML injection           . furosemide (LASIX) injection 20 mg  20 mg Intravenous Daily Maryann Mikhail, DO   20 mg at 06/06/13 1009  . hydrALAZINE (APRESOLINE) injection 10 mg  10 mg Intravenous Q6H PRN Maryann Mikhail, DO      . lidocaine (XYLOCAINE) 1 % (with pres) injection           . midazolam (VERSED) 2 MG/2ML injection           . morphine 2 MG/ML injection 2 mg  2 mg Intravenous Q4H PRN Maryann Mikhail, DO      . potassium chloride SA (K-DUR,KLOR-CON) CR tablet 20 mEq  20 mEq Oral Daily Jessica U Vann, DO      . Tbo-Filgrastim (GRANIX) injection 480 mcg  480 mcg Subcutaneous q1800 Heath Lark, MD        REVIEW OF SYSTEMS:   Eyes: Denies blurriness of vision, double vision or  watery eyes Respiratory: Denies cough, dyspnea or wheezes Cardiovascular: Denies palpitation, chest discomfort or lower extremity swelling Gastrointestinal:  Denies nausea, heartburn or change in bowel habits Lymphatics: Denies new lymphadenopathy or easy bruising Neurological:Denies numbness, tingling or new weaknesses Behavioral/Psych: Mood is stable, no new changes  All other systems were reviewed with the patient and are negative.  PHYSICAL EXAMINATION: ECOG PERFORMANCE STATUS:  1 - Symptomatic but completely ambulatory  Filed Vitals:   06/07/13 1230  BP: 134/82  Pulse: 78  Temp:   Resp: 17   Filed Weights   06/05/13 0639 06/06/13 0502 06/07/13 0556  Weight: 164 lb 3.2 oz (74.481 kg) 164 lb 14.5 oz (74.8 kg) 164 lb 7 oz (74.587 kg)    GENERAL:alert, no distress and comfortable SKIN: She has no rash. The buttock lesion has improved significantly since I saw her a week ago. EYES: normal, conjunctiva are pink and non-injected, sclera clear OROPHARYNX:no exudate, no erythema and lips, buccal mucosa, and tongue normal. The gum swelling has improved  NECK: supple, thyroid normal size, non-tender, without nodularity LYMPH:  Persistent palpable lymphadenopathy in the neck but not elsewhere  LUNGS: clear to auscultation and percussion with normal breathing effort HEART: regular rate & rhythm and no murmurs and no lower extremity edema ABDOMEN:abdomen soft, non-tender and normal bowel sounds Musculoskeletal:no cyanosis of digits and no clubbing  PSYCH: alert & oriented x 3 with fluent speech NEURO: no focal motor/sensory deficits  LABORATORY DATA:  I have reviewed the data as listed Lab Results  Component Value Date   WBC 2.7* 06/07/2013   HGB 8.7* 06/07/2013   HCT 27.6* 06/07/2013   MCV 74.0* 06/07/2013   PLT 314 06/07/2013   Lab Results  Component Value Date   NA 139 06/06/2013   K 3.5* 06/06/2013   CL 102 06/06/2013   CO2 23 06/06/2013    RADIOGRAPHIC STUDIES: I have  personally reviewed the radiological images as listed and agreed with the findings in the report. Dg Neck Soft Tissue  06/02/2013   CLINICAL DATA:  Sore throat  EXAM: NECK SOFT TISSUES - 1+ VIEW  COMPARISON:  02/15/2013  FINDINGS: No acute bony abnormality is seen. There is some steepling of the subglottic airway and mild narrowing in the AP dimension on the lateral projection. Some soft tissue fullness is noted just above the thyroid cartilage. This has increased somewhat in the interval from the prior CT examination. No acute bony abnormality is seen. Some chronic calcifications are noted.  IMPRESSION: Soft tissue fullness just above the thyroid cartilage in the proximal trachea/subglottic airway. This has increased from the prior CT examination. Direct visualization may be helpful.  The epiglottis is stable in appearance from the prior exam.   Electronically Signed   By: Inez Catalina M.D.   On: 06/02/2013 19:00   Ct Soft Tissue Neck W Contrast  06/02/2013   CLINICAL DATA:  Neck swelling and pain.  Next item  EXAM: CT NECK WITH CONTRAST  TECHNIQUE: Multidetector CT imaging of the neck was performed using the standard protocol following the bolus administration of intravenous contrast.  CONTRAST:  51mL OMNIPAQUE IOHEXOL 300 MG/ML  SOLN  COMPARISON:  DG NECK SOFT TISSUE dated 06/02/2013; CT NECK W/CM dated 02/15/2013; NM PET IMAGE INITIAL (PI) SKULL BASE TO THIGH dated 03/29/2013  FINDINGS: Extensive mucosal thickening is present throughout the oropharynx hypopharynx and larynx. Prominent posterior mucosal thickening within the hypopharynx narrows the airway. The epiglottis is inflamed. There is marked enhancement of the palatine tonsils bilaterally without abscess. Edematous changes are present in the parapharyngeal space bilaterally.  Multiple enlarged hyper enhancing nodes are present bilaterally. There are enlarged nodes in the submandibular space bilaterally, measuring up to 17 x 13 mm on the right and 13 x  20 mm on the left. An enlarged right submental lymph node is stable at 16 x 12 mm. There is increased edematous  changes throughout the anterior neck surrounding the strap muscles and thyroid. No discrete thyroid nodule is evident. Bilateral cervical adenopathy is similar to the prior studies. An anterior right level 2 lymph node measures 16 x 22 mm on the sagittal reformats. The anterior left level 2 lymph node measures 19 x 13 mm.  Degenerative changes of the cervical spine are most evident at C5-6 with uncovertebral and foraminal narrowing bilaterally.  IMPRESSION: 1. Extensive edematous changes throughout the oropharynx, hypopharynx, and larynx compatible with a diffuse pharyngitis. No discrete abscess is present. 2. Prominent swelling the epiglottis compatible with epiglottitis. 3. Extensive bilateral cervical adenopathy, most prominent level 1 L2 stations. 4. Increase in diffuse edema within the deep cervical spaces and surrounding strap muscles and thyroid. No focal thyroid lesion is evident. 5. Focal narrowing of the airway within the hypopharynx secondary to extensive posterior pharyngeal edema.   Electronically Signed   By: Lawrence Santiago M.D.   On: 06/02/2013 20:20    ASSESSMENT & PLAN:  #1 severe leukopenia She is not responding to low dose G-CSF. I agree proceeding with repeat bone marrow biopsy and culture for atypical infection. The patient remain at high-risk for sepsis. I will increase the dose of G-CSF to 480 mcg with goal to achieve ANC greater than 1500 Lymphoma and leukemia had been ruled out from recent lymph node biopsy, PET scan and bone marrow biopsy. From the hematology tumor board discussion, potential differential diagnosis included rare IgG related diseases has been discussed. I am skeptical about this potential diagnosis.   I think I will wait for results on the repeat bone marrow biopsy to determine the cause of her profound bone marrow suppression. Once we rule out fungal  infection, I think it might be reasonable to try high dose corticosteroids in the future.  #2 anemia This is anemia chronic disease. She does not require transfusion right now. She is not symptomatic. #3 genital lesions around her buttock cleft #4 positive syphilis testing Infectious disease has been consulted. I have been monitoring her genital lesions for over 3 months. They have improved significantly since I last saw her a week ago. #4 mucositis #5 gum swelling Cause is unknown. Again this has improved significantly since I saw her a week ago. I suspect she has significant infection causing airway inflammation.  The current broad-spectrum IV antibiotics is working well.  I will continue to follow her tomorrow. This is discussed with the attending hospitalist. All questions were answered. The patient knows to call the clinic with any problems, questions or concerns.    Rene Gonsoulin, MD $Remo'@[id number]$ @ 12:43 PM

## 2013-06-07 NOTE — Progress Notes (Signed)
NUTRITION FOLLOW UP  Intervention:   Provide Ensure Complete BID Encourage PO intake as tolerated  Nutrition Dx:   Inadequate oral intake related to inability to eat as evidenced by NPO; discontinued  Goal:   Pt to meet >/= 90% of their estimated nutrition needs   Monitor:   Weights, labs, diet advancement, swallowing ability  Assessment:   Pt with history of coronary artery disease, diastolic CHF, lymphadenopathy that presents emergency department for neck swelling and sore throat. Patient has been experiencing this for approximately 4 months for which she had a workup. At one point patient was told she had syphilis. Patient complains of some shortness of breath however states this has been ongoing.   3/14: Met with pt who reports 37 pound unintended weight loss in the past 2 months, however past weight trend shows pt's weight down 23 pounds during this time frame. Pt denies any decreased muscle strength. Reports she's been trying to supplement her diet of 2 meals/day with 1-2 30g protein shakes/day recently to help prevent further weight loss. States she was swallowing fine until 2 days ago, she tried to drink some water today but choked on it.   3/17: Pt reports her appetite is good and she is now able to chew and swallow without difficulty. She reports eating 100% of lunch today. Pt's weight continues to trend down with additional 2 lb weight loss since admission. Pt interested in receiving Ensure supplements.   Height: Ht Readings from Last 1 Encounters:  06/03/13 $RemoveB'5\' 6"'KzmYQvlh$  (1.676 m)    Weight Status:   Wt Readings from Last 1 Encounters:  06/07/13 164 lb 7 oz (74.587 kg)    Re-estimated needs:  Kcal: 1500-1700  Protein: 75-90g  Fluid: 1.5-1.7L/day   Skin: intact  Diet Order: General   Intake/Output Summary (Last 24 hours) at 06/07/13 1630 Last data filed at 06/07/13 1410  Gross per 24 hour  Intake    780 ml  Output      0 ml  Net    780 ml    Last BM:  3/15   Labs:   Recent Labs Lab 06/03/13 0338 06/04/13 0237 06/06/13 0643  NA 133* 139 139  K 3.3* 4.1 3.5*  CL 97 99 102  CO2 $Re'23 24 23  'rJy$ BUN $R'9 17 20  'Nq$ CREATININE 0.93 0.81 0.75  CALCIUM 9.1 10.0 9.2  GLUCOSE 106* 101* 100*    CBG (last 3)  No results found for this basename: GLUCAP,  in the last 72 hours  Scheduled Meds: . aspirin  325 mg Oral Daily  . ceFEPime (MAXIPIME) IV  2 g Intravenous Q8H  . clindamycin (CLEOCIN) IV  600 mg Intravenous 3 times per day  . dexamethasone  20 mg Intravenous Q24H  . enoxaparin (LOVENOX) injection  40 mg Subcutaneous Q24H  . fentaNYL      . furosemide  20 mg Intravenous Daily  . lidocaine      . midazolam      . potassium chloride  20 mEq Oral Daily  . Tbo-Filgrastim  480 mcg Subcutaneous q1800    Continuous Infusions:   Pryor Ochoa RD, LDN Inpatient Clinical Dietitian Pager: [phone] After Hours Pager: [phone]

## 2013-06-08 ENCOUNTER — Inpatient Hospital Stay (HOSPITAL_COMMUNITY): Payer: No Typology Code available for payment source

## 2013-06-08 DIAGNOSIS — R05 Cough: Secondary | ICD-10-CM

## 2013-06-08 DIAGNOSIS — R079 Chest pain, unspecified: Secondary | ICD-10-CM

## 2013-06-08 DIAGNOSIS — R059 Cough, unspecified: Secondary | ICD-10-CM

## 2013-06-08 LAB — FLUORESCENT TREPONEMAL AB(FTA)-IGG-BLD: FLUORESCENT TREPONEMAL AB, IGG: REACTIVE — AB

## 2013-06-08 LAB — TROPONIN I: Troponin I: <0.3 ng/mL (ref <0.30)

## 2013-06-08 LAB — BASIC METABOLIC PANEL
BUN: 17 mg/dL (ref 6–23)
CHLORIDE: 103 meq/L (ref 96–112)
CO2: 23 mEq/L (ref 19–32)
Calcium: 9.2 mg/dL (ref 8.4–10.5)
Creatinine, Ser: 0.65 mg/dL (ref 0.50–1.10)
GFR calc non Af Amer: >90 mL/min (ref >90)
Glucose, Bld: 99 mg/dL (ref 70–99)
POTASSIUM: 3.6 meq/L — AB (ref 3.7–5.3)
SODIUM: 138 meq/L (ref 137–147)

## 2013-06-08 LAB — CBC WITH DIFFERENTIAL/PLATELET
BAND NEUTROPHILS: 0 % (ref 0–10)
BLASTS: 0 %
Basophils Absolute: 0 10*3/uL (ref 0.0–0.1)
Basophils Relative: 0 % (ref 0–1)
Eosinophils Absolute: 0 10*3/uL (ref 0.0–0.7)
Eosinophils Relative: 1 % (ref 0–5)
HCT: 27.2 % — ABNORMAL LOW (ref 36.0–46.0)
Hemoglobin: 8.5 g/dL — ABNORMAL LOW (ref 12.0–15.0)
Lymphocytes Relative: 94 % — ABNORMAL HIGH (ref 12–46)
Lymphs Abs: 2.4 10*3/uL (ref 0.7–4.0)
MCH: 23.2 pg — AB (ref 26.0–34.0)
MCHC: 31.3 g/dL (ref 30.0–36.0)
MCV: 74.3 fL — AB (ref 78.0–100.0)
METAMYELOCYTES PCT: 0 %
MYELOCYTES: 0 %
Monocytes Absolute: 0.1 10*3/uL (ref 0.1–1.0)
Monocytes Relative: 5 % (ref 3–12)
Neutro Abs: 0 10*3/uL — ABNORMAL LOW (ref 1.7–7.7)
Neutrophils Relative %: 0 % — ABNORMAL LOW (ref 43–77)
Platelets: 301 10*3/uL (ref 150–400)
Promyelocytes Absolute: 0 %
RBC: 3.66 MIL/uL — ABNORMAL LOW (ref 3.87–5.11)
RDW: 16.8 % — AB (ref 11.5–15.5)
WBC: 2.5 10*3/uL — ABNORMAL LOW (ref 4.0–10.5)
nRBC: 0 /100 WBC

## 2013-06-08 LAB — PATHOLOGIST SMEAR REVIEW

## 2013-06-08 LAB — LIPASE, BLOOD: Lipase: 13 U/L (ref 11–59)

## 2013-06-08 LAB — D-DIMER, QUANTITATIVE (NOT AT ARMC): D-Dimer, Quant: 0.52 ug/mL-FEU — ABNORMAL HIGH (ref 0.00–0.48)

## 2013-06-08 MED ORDER — CLINDAMYCIN HCL 300 MG PO CAPS
300.0000 mg | ORAL_CAPSULE | Freq: Three times a day (TID) | ORAL | Status: DC
  Administered 2013-06-08: 300 mg via ORAL

## 2013-06-08 MED ORDER — NITROGLYCERIN 0.4 MG SL SUBL
0.4000 mg | SUBLINGUAL_TABLET | SUBLINGUAL | Status: DC | PRN
  Administered 2013-06-08 (×2): 0.4 mg via SUBLINGUAL

## 2013-06-08 MED ORDER — NITROGLYCERIN 0.4 MG SL SUBL
SUBLINGUAL_TABLET | SUBLINGUAL | Status: AC
  Administered 2013-06-08: 0.4 mg
  Filled 2013-06-08: qty 1

## 2013-06-08 MED ORDER — PANTOPRAZOLE SODIUM 40 MG PO TBEC
40.0000 mg | DELAYED_RELEASE_TABLET | Freq: Every day | ORAL | Status: DC
  Administered 2013-06-08 – 2013-06-09 (×2): 40 mg via ORAL
  Filled 2013-06-08 (×2): qty 1

## 2013-06-08 MED ORDER — LEVOFLOXACIN 500 MG PO TABS
500.0000 mg | ORAL_TABLET | Freq: Every day | ORAL | Status: DC
  Administered 2013-06-08 – 2013-06-09 (×2): 500 mg via ORAL
  Filled 2013-06-08 (×2): qty 1

## 2013-06-08 MED ORDER — IOHEXOL 350 MG/ML SOLN
100.0000 mL | Freq: Once | INTRAVENOUS | Status: AC | PRN
  Administered 2013-06-08: 100 mL via INTRAVENOUS

## 2013-06-08 MED ORDER — FUROSEMIDE 20 MG PO TABS
20.0000 mg | ORAL_TABLET | Freq: Every day | ORAL | Status: DC
  Administered 2013-06-08 – 2013-06-09 (×2): 20 mg via ORAL
  Filled 2013-06-08 (×2): qty 1

## 2013-06-08 NOTE — Progress Notes (Signed)
See RN note re: chest pain. Pt c/o chest pain earlier and NP was notified. NTG, MSO4, O2 was ordered. Pt had received 325mg  ASA earlier in day. Pain was relieved and VS remained stable--not tachycardic, BP 140s--. EKG #1 showed ? ST depression, but #2 did not. Troponins cycled, 1st one < .30. EKG for f/up ordered for 8am. Pt has been pain free since episode. Ddimer slightly elevated at .52. Case discussed with Dr. Hal Hope. CTA chest ordered to r/o PE. Creatinine is normal. Will report to oncoming attending at [place], NP [facility]

## 2013-06-08 NOTE — Progress Notes (Signed)
[facility] PROGRESS NOTE  Morgan Bean BUL:[id number] DOB: 1966/02/21 DOA: 06/02/2013 PCP: [name], MD  Interim Summary Patient is a pleasant 48 year old female with a past medical history of pancytopenia who is being evaluated at the [facility], undergoing bone marrow biopsy which came back negative. She was recently diagnosed with syphilis and started on doxycycline therapy. Patient was admitted to the medicine service on 06/02/2013 presenting with neck swelling and sore throat. A CT scan of the neck performed on 06/02/2013 revealed extensive edematous changes throughout the oropharynx, hypopharynx and larynx compatible with diffuse pharyngitis. There is also prominent swelling of the epiglottis. Labs revealed the presence of neutropenia as she was started on broad-spectrum empiric IV antibiotic therapy with cefepime 2 g IV every 8 hours clindamycin 600 mg IV every 8 hours and vancomycin IV. Patient was seen and evaluated by ENT as if she underwent transnasal fiberoptic lower endoscopy on 06/02/2013 which showed thickened epiglottis with arytenoids edematous and overhanging the glottis. She reported feeling some improvement on the following day as her diet was advanced to clears. ID recommended hematology consult for role of GCSF in this setting. I discussed case with Dr.Shadad who felt it would be reasonable to treat with Neulasta 300 mcg daily. By 06/04/13 she reported ongoing improvement with stability of airway. Transfer orders written for Med/Surg. By 06/05/2013 she reported feeling well, tolerating by mouth intake. Infectious disease recommending bone marrow biopsy for AFB smear and cultures, fungal smear and culture.    Assessment/Plan: Acute supraglottitis, Resolved.  ID recommended continuing IV antibiotics until discharge.- change to PO at that time Possible d/c in AM  Chest pain- ? Esophageal spasm- add protonix -EKG sinus -cycle CE -place on  tele -CTA -lipase   Neutropenia. Etiology unclear.  Patient with history of neutropenia who has been seen by hematology at the [place] . She had a bone marrow biopsy performed in January of 2015 which was unrevealing. Infectious disease recommending bone marrow biopsy for AFB smear and culture, fungal smear and culture.  BMB 3/18  History of syphilis. Infectious disease consulted. Doxy d/c'd - wound on buttocks much better   History of hypertension. Her blood pressures are stable with systolic blood pressures in the 120s. She remains on Lasix 20 mg daily.  Code Status: Full Code Family Communication: patient at bedside Disposition Plan: Will continue IV antibiotics, steroids, Neupogen, followup on a.m. CBC -If patient d/c'd on Friday, Dr. Alvy Bimler will follow up on Wednesday for culture   Consultants:  Infectious disease  ENT  hematology  Procedures:  Transnasal fiberoptic laryngoscopy performed on 06/02/2048  Antibiotics:  Cefepime 2 g  IV every 8 hours  Clindamycin 600 mg every 8 hours  HPI/Subjective: Had episode of CP last PM   Objective: Filed Vitals:   06/08/13 0628  BP: 149/91  Pulse: 72  Temp: 97.7 F (36.5 C)  Resp: 15    Intake/Output Summary (Last 24 hours) at 06/08/13 0815 Last data filed at 06/08/13 3212  Gross per 24 hour  Intake   1090 ml  Output      0 ml  Net   1090 ml   Filed Weights   06/06/13 0502 06/07/13 0556 06/08/13 0355  Weight: 74.8 kg (164 lb 14.5 oz) 74.587 kg (164 lb 7 oz) 69.627 kg (153 lb 8 oz)    Exam:   General:  Patient does not appear to be in acute distress, she seems to be breathing comfortably, up walking around room  Cardiovascular: Regular rate  rhythm  Respiratory: Coarse respiratory sounds, having normal respiratory effort  Abdomen: Soft nontender nondistended  Musculoskeletal: No edema  Data Reviewed: Basic Metabolic Panel:  Recent Labs Lab 06/02/13 1800 06/03/13 0338 06/04/13 0237  06/06/13 0643 06/08/13 0450  NA 132* 133* 139 139 138  K 3.8 3.3* 4.1 3.5* 3.6*  CL 95* 97 99 102 103  CO2 $Re'22 23 24 23 23  'gXH$ GLUCOSE 100* 106* 101* 100* 99  BUN $Re'9 9 17 20 17  'BwE$ CREATININE 1.03 0.93 0.81 0.75 0.65  CALCIUM 9.4 9.1 10.0 9.2 9.2   Liver Function Tests: No results found for this basename: AST, ALT, ALKPHOS, BILITOT, PROT, ALBUMIN,  in the last 168 hours No results found for this basename: LIPASE, AMYLASE,  in the last 168 hours No results found for this basename: AMMONIA,  in the last 168 hours CBC:  Recent Labs Lab 06/02/13 1800  06/04/13 0237 06/05/13 0401 06/06/13 0643 06/07/13 0500 06/08/13 0450  WBC 1.5*  < > 1.4* 2.5* 1.8* 2.7* 2.5*  NEUTROABS 0.0*  --   --  0.0* 0.0* 0.0* 0.0*  HGB 8.5*  < > 9.1* 9.0* 8.9* 8.7* 8.5*  HCT 26.7*  < > 28.0* 28.2* 27.9* 27.6* 27.2*  MCV 74.0*  < > 73.7* 73.2* 73.6* 74.0* 74.3*  PLT 347  < > 388 365 345 314 301  < > = values in this interval not displayed. Cardiac Enzymes:  Recent Labs Lab 06/02/13 1800 06/08/13 0450  TROPONINI <0.30 <0.30   BNP (last 3 results)  Recent Labs  04/16/13 1616 05/02/13 1010 06/02/13 1800  PROBNP 415.6* 132.0* 500.8*   CBG: No results found for this basename: GLUCAP,  in the last 168 hours  Recent Results (from the past 240 hour(s))  CULTURE, BLOOD (ROUTINE X 2)     Status: None   Collection Time    06/02/13  8:28 PM      Result Value Ref Range Status   Specimen Description BLOOD BLOOD RIGHT FOREARM   Final   Special Requests BOTTLES DRAWN AEROBIC AND ANAEROBIC 3CC   Final   Culture  Setup Time     Final   Value: 06/03/2013 00:45     Performed at [facility]   Culture     Final   Value:        BLOOD CULTURE RECEIVED NO GROWTH TO DATE CULTURE WILL BE HELD FOR 5 DAYS BEFORE ISSUING A FINAL NEGATIVE REPORT     Performed at [facility]   Report Status PENDING   Incomplete  CULTURE, BLOOD (ROUTINE X 2)     Status: None   Collection Time    06/02/13  8:28 PM       Result Value Ref Range Status   Specimen Description BLOOD BLOOD RIGHT FOREARM   Final   Special Requests BOTTLES DRAWN AEROBIC AND ANAEROBIC 4CC   Final   Culture  Setup Time     Final   Value: 06/03/2013 00:46     Performed at [facility]   Culture     Final   Value:        BLOOD CULTURE RECEIVED NO GROWTH TO DATE CULTURE WILL BE HELD FOR 5 DAYS BEFORE ISSUING A FINAL NEGATIVE REPORT     Performed at [facility]   Report Status PENDING   Incomplete  AFB CULTURE, BLOOD     Status: None   Collection Time    06/02/13  9:04 PM  Result Value Ref Range Status   Specimen Description BLOOD RIGHT ARM   Final   Special Requests NONE   Final   Culture     Final   Value: CULTURE WILL BE EXAMINED FOR 6 WEEKS BEFORE ISSUING A FINAL REPORT     Performed at [facility]   Report Status PENDING   Incomplete  URINE CULTURE     Status: None   Collection Time    06/02/13  9:10 PM      Result Value Ref Range Status   Specimen Description URINE, CLEAN CATCH   Final   Special Requests NONE   Final   Culture  Setup Time     Final   Value: 06/03/2013 01:42     Performed at [facility] Count     Final   Value: 30,000 COLONIES/ML     Performed at [facility]   Culture     Final   Value: Multiple bacterial morphotypes present, none predominant. Suggest appropriate recollection if clinically indicated.     Performed at [facility]   Report Status 06/04/2013 FINAL   Final  MRSA PCR SCREENING     Status: None   Collection Time    06/03/13  3:45 AM      Result Value Ref Range Status   MRSA by PCR NEGATIVE  NEGATIVE Final   Comment:            The GeneXpert MRSA Assay (FDA     approved for NASAL specimens     only), is one component of a     comprehensive MRSA colonization     surveillance program. It is not     intended to diagnose MRSA     infection nor to guide or     monitor treatment for     MRSA infections.     Studies: Ct  Biopsy  06/07/2013   CLINICAL DATA:  48 year old with pancytopenia. Tissue cultures have been requested.  EXAM: CT GUIDED BONE MARROW ASPIRATES AND BIOPSY  Physician: Stephan Minister. Anselm Pancoast, MD  MEDICATIONS: 4 mg versed, 200 mcg fentanyl. A radiology nurse monitored the patient for moderate sedation.  ANESTHESIA/SEDATION: Sedation time: 30 min  PROCEDURE: The procedure was explained to the patient. The risks and benefits of the procedure were discussed and the patient's questions were addressed. Informed consent was obtained from the patient. The patient was placed prone on CT scan. Images of the pelvis were obtained. The right side of back was prepped and draped in sterile fashion. The skin and right posterior iliac bone were anesthetized with 1% lidocaine. 11 gauge bone needle was directed into the right iliac bone with CT guidance. Two aspirates and 2 core biopsies obtained. One core biopsy was sent for microbiology.  FINDINGS: Needle placement in the right posterior iliac bone. Again noted is an enlarged and multi-lobulated uterus with scattered calcifications. Findings are consistent with a fibroid uterus. Limited evaluation of the adnexal tissue.  COMPLICATIONS: None  IMPRESSION: CT guided bone marrow aspirates and core biopsy.   Electronically Signed   By: Markus Daft M.D.   On: 06/07/2013 17:44   Dg Chest Port 1 View  06/08/2013   CLINICAL DATA:  Acute onset chest pain.  EXAM: PORTABLE CHEST - 1 VIEW  COMPARISON:  PA and lateral chest 04/16/2013.  CT chest 04/16/2013.  FINDINGS: Heart size is mildly enlarged. The lungs are clear. No pneumothorax or pleural effusion. No focal bony  abnormality.  IMPRESSION: Mild cardiomegaly without acute disease.   Electronically Signed   By: Inge Rise M.D.   On: 06/08/2013 03:53   - Scheduled Meds: . aspirin  325 mg Oral Daily  . ceFEPime (MAXIPIME) IV  2 g Intravenous Q8H  . clindamycin (CLEOCIN) IV  600 mg Intravenous 3 times per day  . dexamethasone  20 mg  Intravenous Q24H  . enoxaparin (LOVENOX) injection  40 mg Subcutaneous Q24H  . feeding supplement (ENSURE COMPLETE)  237 mL Oral BID BM  . furosemide  20 mg Intravenous Daily  . potassium chloride  20 mEq Oral Daily  . Tbo-Filgrastim  480 mcg Subcutaneous q1800   Continuous Infusions:   Active Problems:   Lymphadenopathy   Neutropenic fever   Protein-calorie malnutrition, severe    Time spent: 35 min    Chaquana Nichols  [facility] Pager [phone] 7AM-7AM, please contact night-coverage at [email or web address], password [facility] 06/08/2013, 8:15 AM  LOS: 6 days

## 2013-06-08 NOTE — Progress Notes (Signed)
Patient complaining of severe chest pain 9/10 like someone was "grabbing her chest." Patient very restless moving around in the bed with mild diaphoresis.  VSS. Floor coverage K. Baltazar Najjar notified.  Received orders for 12 lead EKG, chest x-ray, nitro q.59min PRN, 2L O2 via Nasal Cannula, and Troponins.  EKG obtained at 0340 and Nitro administered x2 with slight relief after second tablet.  Patient stated that pain felt like it was "radiating to her back now."  Third nitro administered along with 2mg  Morphine.  Rapid Response nurse, April called to the floor for further assessment.  Noted EKG changes and additional EKG obtained at 0410 when patient stated her pain was 1/10. Lab notified regarding stat troponins at 0415--awaiting blood draw.  Patient now resting comfortably. Will continue to monitor.

## 2013-06-08 NOTE — Progress Notes (Signed)
[place] for Infectious Disease  Date of Admission:  06/02/2013  Antibiotics: Cefepime, clindamycin  Subjective: Throat stable  Objective: Temp:  [97.3 F (36.3 C)-97.8 F (36.6 C)] 97.8 F (36.6 C) (03/19 1400) Pulse Rate:  [63-76] 73 (03/19 1400) Resp:  [14-16] 16 (03/19 1400) BP: (121-149)/(87-99) 134/89 mmHg (03/19 1400) SpO2:  [97 %-100 %] 100 % (03/19 1400) Weight:  [153 lb 8 oz (69.627 kg)] 153 lb 8 oz (69.627 kg) (03/19 0355)  General: Awake, alert, nad Lungs: CTA B Cor: RRR without m Abdomen: sfot, nt, nd Ext: no edema HEENT: + multiple small lymph nodes  Lab Results Lab Results  Component Value Date   WBC 2.5* 06/08/2013   HGB 8.5* 06/08/2013   HCT 27.2* 06/08/2013   MCV 74.3* 06/08/2013   PLT 301 06/08/2013    Lab Results  Component Value Date   CREATININE 0.65 06/08/2013   BUN 17 06/08/2013   NA 138 06/08/2013   K 3.6* 06/08/2013   CL 103 06/08/2013   CO2 23 06/08/2013    Lab Results  Component Value Date   ALT <6 05/30/2013   AST 8 05/30/2013   ALKPHOS 73 05/30/2013   BILITOT 0.27 05/30/2013      Microbiology: Recent Results (from the past 240 hour(s))  CULTURE, BLOOD (ROUTINE X 2)     Status: None   Collection Time    06/02/13  8:28 PM      Result Value Ref Range Status   Specimen Description BLOOD BLOOD RIGHT FOREARM   Final   Special Requests BOTTLES DRAWN AEROBIC AND ANAEROBIC 3CC   Final   Culture  Setup Time     Final   Value: 06/03/2013 00:45     Performed at [facility]   Culture     Final   Value:        BLOOD CULTURE RECEIVED NO GROWTH TO DATE CULTURE WILL BE HELD FOR 5 DAYS BEFORE ISSUING A FINAL NEGATIVE REPORT     Performed at [facility]   Report Status PENDING   Incomplete  CULTURE, BLOOD (ROUTINE X 2)     Status: None   Collection Time    06/02/13  8:28 PM      Result Value Ref Range Status   Specimen Description BLOOD BLOOD RIGHT FOREARM   Final   Special Requests BOTTLES DRAWN AEROBIC AND ANAEROBIC  4CC   Final   Culture  Setup Time     Final   Value: 06/03/2013 00:46     Performed at [facility]   Culture     Final   Value:        BLOOD CULTURE RECEIVED NO GROWTH TO DATE CULTURE WILL BE HELD FOR 5 DAYS BEFORE ISSUING A FINAL NEGATIVE REPORT     Performed at [facility]   Report Status PENDING   Incomplete  AFB CULTURE, BLOOD     Status: None   Collection Time    06/02/13  9:04 PM      Result Value Ref Range Status   Specimen Description BLOOD RIGHT ARM   Final   Special Requests NONE   Final   Culture     Final   Value: CULTURE WILL BE EXAMINED FOR 6 WEEKS BEFORE ISSUING A FINAL REPORT     Performed at [facility]   Report Status PENDING   Incomplete  URINE CULTURE     Status: None   Collection Time  06/02/13  9:10 PM      Result Value Ref Range Status   Specimen Description URINE, CLEAN CATCH   Final   Special Requests NONE   Final   Culture  Setup Time     Final   Value: 06/03/2013 01:42     Performed at [facility] Count     Final   Value: 30,000 COLONIES/ML     Performed at [facility]   Culture     Final   Value: Multiple bacterial morphotypes present, none predominant. Suggest appropriate recollection if clinically indicated.     Performed at [facility]   Report Status 06/04/2013 FINAL   Final  MRSA PCR SCREENING     Status: None   Collection Time    06/03/13  3:45 AM      Result Value Ref Range Status   MRSA by PCR NEGATIVE  NEGATIVE Final   Comment:            The GeneXpert MRSA Assay (FDA     approved for NASAL specimens     only), is one component of a     comprehensive MRSA colonization     surveillance program. It is not     intended to diagnose MRSA     infection nor to guide or     monitor treatment for     MRSA infections.  TISSUE CULTURE     Status: None   Collection Time    06/07/13 12:38 PM      Result Value Ref Range Status   Specimen Description TISSUE BONE MARROW   Final    Special Requests NONE   Final   Gram Stain     Final   Value: NO WBC SEEN     NO SQUAMOUS EPITHELIAL CELLS SEEN     NO ORGANISMS SEEN     Performed at [facility]   Culture PENDING   Incomplete   Report Status PENDING   Incomplete    Studies/Results: Ct Angio Chest Pe W/cm &/or Wo Cm  06/08/2013   CLINICAL DATA:  Chest pain  EXAM: CT ANGIOGRAPHY CHEST WITH CONTRAST  TECHNIQUE: Multidetector CT imaging of the chest was performed using the standard protocol during bolus administration of intravenous contrast. Multiplanar CT image reconstructions and MIPs were obtained to evaluate the vascular anatomy.  CONTRAST:  173mL OMNIPAQUE IOHEXOL 350 MG/ML SOLN  COMPARISON:  Chest CT April 16, 2013 and chest radiograph June 08, 2013  FINDINGS: There is no demonstrable pulmonary embolus. There is no thoracic aortic aneurysm or dissection.  There is focal atelectatic change in the posterior left base. Mild scarring in both lung bases is also noted. There is no frank edema or consolidation.  There is no appreciable thoracic adenopathy. Pericardium is not thickened. There is a small hiatal hernia.  Visualized upper abdominal structures appear normal. There are no blastic or lytic bone lesions. Visualized thyroid appears normal.  Review of the MIP images confirms the above findings.  IMPRESSION: Left base atelectatic change. No demonstrable pulmonary embolus. Small hiatal hernia.   Electronically Signed   By: Lowella Grip M.D.   On: 06/08/2013 10:28   Ct Biopsy  06/07/2013   CLINICAL DATA:  48 year old with pancytopenia. Tissue cultures have been requested.  EXAM: CT GUIDED BONE MARROW ASPIRATES AND BIOPSY  Physician: Stephan Minister. Anselm Pancoast, MD  MEDICATIONS: 4 mg versed, 200 mcg fentanyl. A radiology nurse monitored the patient for moderate sedation.  ANESTHESIA/SEDATION: Sedation time: 30 min  PROCEDURE: The procedure was explained to the patient. The risks and benefits of the procedure were discussed and  the patient's questions were addressed. Informed consent was obtained from the patient. The patient was placed prone on CT scan. Images of the pelvis were obtained. The right side of back was prepped and draped in sterile fashion. The skin and right posterior iliac bone were anesthetized with 1% lidocaine. 11 gauge bone needle was directed into the right iliac bone with CT guidance. Two aspirates and 2 core biopsies obtained. One core biopsy was sent for microbiology.  FINDINGS: Needle placement in the right posterior iliac bone. Again noted is an enlarged and multi-lobulated uterus with scattered calcifications. Findings are consistent with a fibroid uterus. Limited evaluation of the adnexal tissue.  COMPLICATIONS: None  IMPRESSION: CT guided bone marrow aspirates and core biopsy.   Electronically Signed   By: Markus Daft M.D.   On: 06/07/2013 17:44   Dg Chest Port 1 View  06/08/2013   CLINICAL DATA:  Acute onset chest pain.  EXAM: PORTABLE CHEST - 1 VIEW  COMPARISON:  PA and lateral chest 04/16/2013.  CT chest 04/16/2013.  FINDINGS: Heart size is mildly enlarged. The lungs are clear. No pneumothorax or pleural effusion. No focal bony abnormality.  IMPRESSION: Mild cardiomegaly without acute disease.   Electronically Signed   By: Inge Rise M.D.   On: 06/08/2013 03:53    Assessment/Plan: 1)  Neutropenia - unclear etiology.  Heme work up unrevealing.   Bone marrow results pending.   -I will give her a trial of levaquin since cipro allergy just itching, no rash, swelling or other issues.  Not c/w a true allergy.  If tolerates it, she can go home on it.  -we will arrange follow up in about a week to follow up bone marrow AFB and fungal results after hematology appt.  -will keep antibioitic another week and if she remains afebrile she can stop -getting GCSF per hematology  Thannks for consult   Scharlene Gloss, [place] [facility] [place] [email or web address] [phone] pager   [phone] cell 06/08/2013, 3:55 PM

## 2013-06-08 NOTE — Progress Notes (Signed)
Morgan Bean   DOB:05/03/1965   MR#:[id number]    Subjective: She is feeling better. She still has poor appetite. Denies any fevers or chills. She tolerated repeat bone marrow procedure well without significant pain. She complained of mild cough but it is nonproductive. Overnight, she complained of chest pain which has subsequently resolved  Objective:  Filed Vitals:   06/08/13 0628  BP: 149/91  Pulse: 72  Temp: 97.7 F (36.5 C)  Resp: 15     Intake/Output Summary (Last 24 hours) at 06/08/13 0839 Last data filed at 06/08/13 0607  Gross per 24 hour  Intake   1090 ml  Output      0 ml  Net   1090 ml    GENERAL:alert, no distress and comfortable SKIN: skin color, texture, turgor are normal, no rashes or significant lesions EYES: normal, Conjunctiva are pink and non-injected, sclera clear OROPHARYNX:no exudate, no erythema and lips, buccal mucosa, and tongue normal . Previously noted mucositis and gingival hyperplasia has resolved. NECK: supple, thyroid normal size, non-tender, without nodularity LYMPH:  Persistent palpable lymphadenopathy in the cervical region, unchanged compared to previous exam  LUNGS: clear to auscultation and percussion with normal breathing effort HEART: regular rate & rhythm and no murmurs and no lower extremity edema ABDOMEN:abdomen soft, non-tender and normal bowel sounds Musculoskeletal:no cyanosis of digits and no clubbing  NEURO: alert & oriented x 3 with fluent speech, no focal motor/sensory deficits   Labs:  Lab Results  Component Value Date   WBC 2.5* 06/08/2013   HGB 8.5* 06/08/2013   HCT 27.2* 06/08/2013   MCV 74.3* 06/08/2013   PLT 301 06/08/2013   NEUTROABS 0.0* 06/08/2013    Lab Results  Component Value Date   NA 138 06/08/2013   K 3.6* 06/08/2013   CL 103 06/08/2013   CO2 23 06/08/2013    Studies:  Ct Biopsy  06/07/2013   CLINICAL DATA:  47-year-old with pancytopenia. Tissue cultures have been requested.  EXAM: CT GUIDED BONE MARROW  ASPIRATES AND BIOPSY  Physician: Adam R. Henn, MD  MEDICATIONS: 4 mg versed, 200 mcg fentanyl. A radiology nurse monitored the patient for moderate sedation.  ANESTHESIA/SEDATION: Sedation time: 30 min  PROCEDURE: The procedure was explained to the patient. The risks and benefits of the procedure were discussed and the patient's questions were addressed. Informed consent was obtained from the patient. The patient was placed prone on CT scan. Images of the pelvis were obtained. The right side of back was prepped and draped in sterile fashion. The skin and right posterior iliac bone were anesthetized with 1% lidocaine. 11 gauge bone needle was directed into the right iliac bone with CT guidance. Two aspirates and 2 core biopsies obtained. One core biopsy was sent for microbiology.  FINDINGS: Needle placement in the right posterior iliac bone. Again noted is an enlarged and multi-lobulated uterus with scattered calcifications. Findings are consistent with a fibroid uterus. Limited evaluation of the adnexal tissue.  COMPLICATIONS: None  IMPRESSION: CT guided bone marrow aspirates and core biopsy.   Electronically Signed   By: Adam  Henn M.D.   On: 06/07/2013 17:44   Dg Chest Port 1 View  06/08/2013   CLINICAL DATA:  Acute onset chest pain.  EXAM: PORTABLE CHEST - 1 VIEW  COMPARISON:  PA and lateral chest 04/16/2013.  CT chest 04/16/2013.  FINDINGS: Heart size is mildly enlarged. The lungs are clear. No pneumothorax or pleural effusion. No focal bony abnormality.  IMPRESSION: Mild cardiomegaly without acute disease.     Electronically Signed   By: Thomas  Dalessio M.D.   On: 06/08/2013 03:53    Assessment & Plan:  #1 severe leukopenia  She is not responding to low dose G-CSF. I have increased the dose of G-CSF to 480 mcg on 06/07/13 with goal to achieve ANC greater than 1500. Repeat bone marrow biopsy and cultures for atypical infection was done on 06/07/2013, results are pending.  The patient remain at high-risk  for sepsis.  Lymphoma and leukemia had been ruled out from recent lymph node biopsy, PET scan and bone marrow biopsy.  From the hematology tumor board discussion, potential differential diagnosis included rare IgG related diseases. I think it is prudent to wait for results on the repeat bone marrow biopsy to determine the cause of her profound bone marrow suppression.  Once we rule out fungal infection, I think it might be reasonable to try high dose corticosteroids in the future.  #2 anemia  This is anemia chronic disease. She does not require transfusion right now. She is not symptomatic.  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  I have been monitoring her genital lesions for over 3 months.  They have improved significantly since I last saw her a week ago.  #4 mucositis  #5 gum swelling  Cause is unknown. Again this has improved significantly since I saw her a week ago. I suspect she has significant infection causing airway inflammation. The current broad-spectrum IV antibiotics is working well. #6 chest pain #7 nonproductive cough Chest x-ray showed no infiltrate or signs of pneumonia. I would defer to primary service for further workup.  I spoke with the attending hospitalist.  I will be away for the next 3 days.  If the patient is discharged over the weekend, I have followup appointment to see her in my clinic on 06/14/2013. I can review test results with the patient then and determine the next course of action. If she stays in the hospital over the weekend, I will see her next week. Please do not hesitate to contact a hematologist on call over the next few days if questions arise.   , , MD 06/08/2013  8:39 AM   

## 2013-06-09 ENCOUNTER — Telehealth: Payer: Self-pay | Admitting: Dietician

## 2013-06-09 LAB — CBC WITH DIFFERENTIAL/PLATELET
BAND NEUTROPHILS: 0 % (ref 0–10)
BASOS ABS: 0 10*3/uL (ref 0.0–0.1)
BASOS PCT: 1 % (ref 0–1)
Blasts: 0 %
EOS ABS: 0 10*3/uL (ref 0.0–0.7)
EOS PCT: 0 % (ref 0–5)
HEMATOCRIT: 27.9 % — AB (ref 36.0–46.0)
HEMOGLOBIN: 8.8 g/dL — AB (ref 12.0–15.0)
Lymphocytes Relative: 89 % — ABNORMAL HIGH (ref 12–46)
Lymphs Abs: 2.5 10*3/uL (ref 0.7–4.0)
MCH: 23.2 pg — AB (ref 26.0–34.0)
MCHC: 31.5 g/dL (ref 30.0–36.0)
MCV: 73.6 fL — AB (ref 78.0–100.0)
METAMYELOCYTES PCT: 0 %
MYELOCYTES: 0 %
Monocytes Absolute: 0.3 10*3/uL (ref 0.1–1.0)
Monocytes Relative: 10 % (ref 3–12)
Neutro Abs: 0 10*3/uL — ABNORMAL LOW (ref 1.7–7.7)
Neutrophils Relative %: 0 % — ABNORMAL LOW (ref 43–77)
PROMYELOCYTES ABS: 0 %
Platelets: 333 10*3/uL (ref 150–400)
RBC: 3.79 MIL/uL — AB (ref 3.87–5.11)
RDW: 17 % — ABNORMAL HIGH (ref 11.5–15.5)
WBC: 2.8 10*3/uL — ABNORMAL LOW (ref 4.0–10.5)
nRBC: 0 /100 WBC

## 2013-06-09 LAB — CULTURE, BLOOD (ROUTINE X 2)

## 2013-06-09 LAB — CREATININE, SERUM
Creatinine, Ser: 0.66 mg/dL (ref 0.50–1.10)
GFR calc Af Amer: >90 mL/min (ref >90)
GFR calc non Af Amer: >90 mL/min (ref >90)

## 2013-06-09 MED ORDER — LEVOFLOXACIN 500 MG PO TABS: 500.0000 mg | ORAL_TABLET | Freq: Every day | ORAL | Status: DC

## 2013-06-09 NOTE — Telephone Encounter (Signed)
Brief Outpatient Oncology Nutrition Note  Patient has been identified to be at risk on malnutrition screen.  Wt Readings from Last 10 Encounters:  06/09/13 154 lb 3.2 oz (69.945 kg)  05/30/13 170 lb 3.2 oz (77.202 kg)  05/23/13 175 lb 1.6 oz (79.425 kg)  05/03/13 185 lb 9.6 oz (84.188 kg)  05/02/13 183 lb (83.008 kg)  04/25/13 188 lb 6.4 oz (85.458 kg)  04/20/13 186 lb 9.6 oz (84.641 kg)  04/11/13 189 lb 12.8 oz (86.093 kg)  03/29/13 189 lb 1.6 oz (85.775 kg)  03/21/13 187 lb 3.2 oz (84.913 kg)    Called patient secondary to weight loss.  Patient was seen by the inpatient RD 06/02/13 and was not eating well at that time.  Patient was not available.  Provided contact information for the Linda RD.  Antonieta Iba, RD, LDN

## 2013-06-09 NOTE — Progress Notes (Signed)
Discharge instructions and levaquin prescription given and explained to pt.  Pt. Verbalized understanding of all orders/instructions.  IV removed. Pt. In stable condition for discharge home.  Pt. Awaiting ride home. Syliva Overman

## 2013-06-09 NOTE — Discharge Summary (Signed)
Physician Discharge Summary  Liberty Seto YFV:[id number] DOB: 11-Jun-1965 DOA: 06/02/2013  PCP: [name], MD  Admit date: 06/02/2013 Discharge date: 06/09/2013  Time spent: greater than 30 min  Recommendations for Outpatient Follow-up:  1. Follow up bone marrow biopsy results 2. Monitor CBC  Discharge Diagnoses:  Active Problems:   Lymphadenopathy   Neutropenic fever   Protein-calorie malnutrition, severe   Discharge Condition: stable  Filed Weights   06/07/13 0556 06/08/13 0355 06/09/13 0614  Weight: 74.587 kg (164 lb 7 oz) 69.627 kg (153 lb 8 oz) 69.945 kg (154 lb 3.2 oz)    History of present illness/hospital course Patient is a pleasant 48 year old female with a past medical history of pancytopenia who is being evaluated at the [facility], undergoing bone marrow biopsy which came back negative. She was recently diagnosed with syphilis and started on doxycycline therapy. Patient was admitted to the medicine service on 06/02/2013 presenting with neck swelling and sore throat. A CT scan of the neck performed on 06/02/2013 revealed extensive edematous changes throughout the oropharynx, hypopharynx and larynx compatible with diffuse pharyngitis. There is also prominent swelling of the epiglottis. Labs revealed the presence of neutropenia and received broad spectrum antibiotics.  Admitted to stepdown. Patient was seen and evaluated by ENT as if she underwent transnasal fiberoptic lower endoscopy on 06/02/2013 which showed thickened epiglottis with arytenoids edematous and overhanging the glottis.  ID and hematology consulted.  treated with Neulasta. Bone marrow biopsy for fungal cx, AFB and routine studies performed by IR.  Results pending.  Will follow up with ID and heme.  Now tolerating oral antibiotics and stable for discharge.  Procedures:  Bone marrow biopsy. Results pending  Fiberoptic transnasal  lanryngoscopy  Consultations:  ENT  ID  Heme/onc  IR  Discharge Exam: Filed Vitals:   06/09/13 0610  BP: 140/90  Pulse: 78  Temp: 97.6 F (36.4 C)  Resp: 16    General: alert, oriented. comfortable Cardiovascular: RRR Respiratory: CTA  Discharge Instructions  Discharge Orders   Future Appointments Provider Department Dept Phone   06/14/2013 8:00 AM [facility] [phone]   06/14/2013 8:30 AM Heath Lark, MD [facility] [phone]   06/20/2013 8:30 AM [facility] [place] [phone]   07/03/2013 4:30 PM [name], MD [place] [phone]   07/28/2013 9:15 AM [name], MD [facility] [phone]   Future Orders Complete By Expires   Activity as tolerated - No restrictions  As directed    Diet - low sodium heart healthy  As directed        Medication List         albuterol 108 (90 BASE) MCG/ACT inhaler  Commonly known as:  PROVENTIL HFA;VENTOLIN HFA  Inhale 2 puffs into the lungs every 4 (four) hours as needed for wheezing or shortness of breath.     amLODipine 10 MG tablet  Commonly known as:  NORVASC  Take 10 mg by mouth daily.     aspirin 81 MG tablet  Take 81 mg by mouth daily.     buPROPion 100 MG tablet  Commonly known as:  WELLBUTRIN  Take 100 mg by mouth 2 (two) times daily.     citalopram 40 MG tablet  Commonly known as:  CELEXA  Take 40 mg by mouth daily.     cloNIDine 0.3 MG tablet  Commonly known as:  CATAPRES  Take 0.3 mg by mouth 2 (two) times daily.     furosemide 20 MG tablet  Commonly known as:  LASIX  Take 1 tablet (20 mg total) by mouth 2 (two) times daily.     hydrOXYzine 25 MG tablet  Commonly known as:  ATARAX/VISTARIL  Take 25 mg by mouth at bedtime as needed (sleep).     ibuprofen 200 MG tablet  Commonly known as:  ADVIL,MOTRIN  Take 1,000 mg by  mouth once as needed for headache.     levofloxacin 500 MG tablet  Commonly known as:  LEVAQUIN  Take 1 tablet (500 mg total) by mouth daily.     loratadine 10 MG tablet  Commonly known as:  CLARITIN  Take 1 tablet (10 mg total) by mouth daily.     multivitamin with minerals Tabs tablet  Take 1 tablet by mouth daily.     polyethylene glycol powder powder  Commonly known as:  GLYCOLAX/MIRALAX  Take 17 g by mouth daily as needed (constipation).     potassium chloride 10 MEQ tablet  Commonly known as:  K-DUR,KLOR-CON  Take 1 tablet (10 mEq total) by mouth 2 (two) times daily with a meal.     traZODone 100 MG tablet  Commonly known as:  DESYREL  Take 100 mg by mouth at bedtime.       Allergies  Allergen Reactions  . Ciprofloxacin Itching  . Lipitor [Atorvastatin] Swelling       Follow-up Information   Follow up with [facility], NI, MD. (next week. her office will call you)    Specialty:  Hematology and Oncology   Contact information:   [place] [address] [phone]       Follow up with Scharlene Gloss, MD. (his office will call you)    Specialty:  Infectious Diseases   Contact information:   [address] [place] [address] [phone]        The results of significant diagnostics from this hospitalization (including imaging, microbiology, ancillary and laboratory) are listed below for reference.    Significant Diagnostic Studies: Dg Neck Soft Tissue  06/02/2013   CLINICAL DATA:  Sore throat  EXAM: NECK SOFT TISSUES - 1+ VIEW  COMPARISON:  02/15/2013  FINDINGS: No acute bony abnormality is seen. There is some steepling of the subglottic airway and mild narrowing in the AP dimension on the lateral projection. Some soft tissue fullness is noted just above the thyroid cartilage. This has increased somewhat in the interval from the prior CT examination. No acute bony abnormality is seen. Some chronic calcifications are noted.   IMPRESSION: Soft tissue fullness just above the thyroid cartilage in the proximal trachea/subglottic airway. This has increased from the prior CT examination. Direct visualization may be helpful.  The epiglottis is stable in appearance from the prior exam.   Electronically Signed   By: Inez Catalina M.D.   On: 06/02/2013 19:00   Dg Chest 2 View  06/02/2013   CLINICAL DATA:  Shortness of breath  EXAM: CHEST  2 VIEW  COMPARISON:  05/04/2013  FINDINGS: Cardiac shadow is within normal limits. Lungs are clear bilaterally. No sizable effusion is noted. No bony abnormality is seen.  IMPRESSION: No acute abnormality noted.   Electronically Signed   By: Inez Catalina M.D.   On: 06/02/2013 18:56   Ct Soft Tissue Neck W Contrast  06/02/2013   CLINICAL DATA:  Neck swelling and pain.  Next item  EXAM: CT  NECK WITH CONTRAST  TECHNIQUE: Multidetector CT imaging of the neck was performed using the standard protocol following the bolus administration of intravenous contrast.  CONTRAST:  56m OMNIPAQUE IOHEXOL 300 MG/ML  SOLN  COMPARISON:  DG NECK SOFT TISSUE dated 06/02/2013; CT NECK W/CM dated 02/15/2013; NM PET IMAGE INITIAL (PI) SKULL BASE TO THIGH dated 03/29/2013  FINDINGS: Extensive mucosal thickening is present throughout the oropharynx hypopharynx and larynx. Prominent posterior mucosal thickening within the hypopharynx narrows the airway. The epiglottis is inflamed. There is marked enhancement of the palatine tonsils bilaterally without abscess. Edematous changes are present in the parapharyngeal space bilaterally.  Multiple enlarged hyper enhancing nodes are present bilaterally. There are enlarged nodes in the submandibular space bilaterally, measuring up to 17 x 13 mm on the right and 13 x 20 mm on the left. An enlarged right submental lymph node is stable at 16 x 12 mm. There is increased edematous changes throughout the anterior neck surrounding the strap muscles and thyroid. No discrete thyroid nodule is evident.  Bilateral cervical adenopathy is similar to the prior studies. An anterior right level 2 lymph node measures 16 x 22 mm on the sagittal reformats. The anterior left level 2 lymph node measures 19 x 13 mm.  Degenerative changes of the cervical spine are most evident at C5-6 with uncovertebral and foraminal narrowing bilaterally.  IMPRESSION: 1. Extensive edematous changes throughout the oropharynx, hypopharynx, and larynx compatible with a diffuse pharyngitis. No discrete abscess is present. 2. Prominent swelling the epiglottis compatible with epiglottitis. 3. Extensive bilateral cervical adenopathy, most prominent level 1 L2 stations. 4. Increase in diffuse edema within the deep cervical spaces and surrounding strap muscles and thyroid. No focal thyroid lesion is evident. 5. Focal narrowing of the airway within the hypopharynx secondary to extensive posterior pharyngeal edema.   Electronically Signed   By: CLawrence SantiagoM.D.   On: 06/02/2013 20:20   Ct Angio Chest Pe W/cm &/or Wo Cm  06/08/2013   CLINICAL DATA:  Chest pain  EXAM: CT ANGIOGRAPHY CHEST WITH CONTRAST  TECHNIQUE: Multidetector CT imaging of the chest was performed using the standard protocol during bolus administration of intravenous contrast. Multiplanar CT image reconstructions and MIPs were obtained to evaluate the vascular anatomy.  CONTRAST:  1011mOMNIPAQUE IOHEXOL 350 MG/ML SOLN  COMPARISON:  Chest CT April 16, 2013 and chest radiograph June 08, 2013  FINDINGS: There is no demonstrable pulmonary embolus. There is no thoracic aortic aneurysm or dissection.  There is focal atelectatic change in the posterior left base. Mild scarring in both lung bases is also noted. There is no frank edema or consolidation.  There is no appreciable thoracic adenopathy. Pericardium is not thickened. There is a small hiatal hernia.  Visualized upper abdominal structures appear normal. There are no blastic or lytic bone lesions. Visualized thyroid appears  normal.  Review of the MIP images confirms the above findings.  IMPRESSION: Left base atelectatic change. No demonstrable pulmonary embolus. Small hiatal hernia.   Electronically Signed   By: WiLowella Grip.D.   On: 06/08/2013 10:28   Ct Biopsy  06/07/2013   CLINICAL DATA:  [age]ear old with pancytopenia. Tissue cultures have been requested.  EXAM: CT GUIDED BONE MARROW ASPIRATES AND BIOPSY  Physician: AdStephan MinisterHeAnselm PancoastMD  MEDICATIONS: 4 mg versed, 200 mcg fentanyl. A radiology nurse monitored the patient for moderate sedation.  ANESTHESIA/SEDATION: Sedation time: 30 min  PROCEDURE: The procedure was explained to the patient. The risks and benefits of the procedure  were discussed and the patient's questions were addressed. Informed consent was obtained from the patient. The patient was placed prone on CT scan. Images of the pelvis were obtained. The right side of back was prepped and draped in sterile fashion. The skin and right posterior iliac bone were anesthetized with 1% lidocaine. 11 gauge bone needle was directed into the right iliac bone with CT guidance. Two aspirates and 2 core biopsies obtained. One core biopsy was sent for microbiology.  FINDINGS: Needle placement in the right posterior iliac bone. Again noted is an enlarged and multi-lobulated uterus with scattered calcifications. Findings are consistent with a fibroid uterus. Limited evaluation of the adnexal tissue.  COMPLICATIONS: None  IMPRESSION: CT guided bone marrow aspirates and core biopsy.   Electronically Signed   By: Markus Daft M.D.   On: 06/07/2013 17:44   Dg Chest Port 1 View  06/08/2013   CLINICAL DATA:  Acute onset chest pain.  EXAM: PORTABLE CHEST - 1 VIEW  COMPARISON:  PA and lateral chest 04/16/2013.  CT chest 04/16/2013.  FINDINGS: Heart size is mildly enlarged. The lungs are clear. No pneumothorax or pleural effusion. No focal bony abnormality.  IMPRESSION: Mild cardiomegaly without acute disease.   Electronically Signed   By:  Inge Rise M.D.   On: 06/08/2013 03:53    Microbiology: Recent Results (from the past 240 hour(s))  CULTURE, BLOOD (ROUTINE X 2)     Status: None   Collection Time    06/02/13  8:28 PM      Result Value Ref Range Status   Specimen Description BLOOD BLOOD RIGHT FOREARM   Final   Special Requests BOTTLES DRAWN AEROBIC AND ANAEROBIC 3CC   Final   Culture  Setup Time     Final   Value: 06/03/2013 00:45     Performed at [facility]   Culture     Final   Value: NO GROWTH 5 DAYS     Performed at [facility]   Report Status 06/09/2013 FINAL   Final  CULTURE, BLOOD (ROUTINE X 2)     Status: None   Collection Time    06/02/13  8:28 PM      Result Value Ref Range Status   Specimen Description BLOOD BLOOD RIGHT FOREARM   Final   Special Requests BOTTLES DRAWN AEROBIC AND ANAEROBIC 4CC   Final   Culture  Setup Time     Final   Value: 06/03/2013 00:46     Performed at [facility]   Culture     Final   Value: NO GROWTH 5 DAYS     Performed at [facility]   Report Status 06/09/2013 FINAL   Final  AFB CULTURE, BLOOD     Status: None   Collection Time    06/02/13  9:04 PM      Result Value Ref Range Status   Specimen Description BLOOD RIGHT ARM   Final   Special Requests NONE   Final   Culture     Final   Value: CULTURE WILL BE EXAMINED FOR 6 WEEKS BEFORE ISSUING A FINAL REPORT     Performed at [facility]   Report Status PENDING   Incomplete  URINE CULTURE     Status: None   Collection Time    06/02/13  9:10 PM      Result Value Ref Range Status   Specimen Description URINE, CLEAN CATCH   Final   Special Requests NONE  Final   Culture  Setup Time     Final   Value: 06/03/2013 01:42     Performed at [facility]     Final   Value: 30,000 COLONIES/ML     Performed at [facility]   Culture     Final   Value: Multiple bacterial morphotypes present, none predominant. Suggest appropriate recollection if  clinically indicated.     Performed at [facility]   Report Status 06/04/2013 FINAL   Final  MRSA PCR SCREENING     Status: None   Collection Time    06/03/13  3:45 AM      Result Value Ref Range Status   MRSA by PCR NEGATIVE  NEGATIVE Final   Comment:            The GeneXpert MRSA Assay (FDA     approved for NASAL specimens     only), is one component of a     comprehensive MRSA colonization     surveillance program. It is not     intended to diagnose MRSA     infection nor to guide or     monitor treatment for     MRSA infections.  TISSUE CULTURE     Status: None   Collection Time    06/07/13 12:38 PM      Result Value Ref Range Status   Specimen Description TISSUE BONE MARROW   Final   Special Requests NONE   Final   Gram Stain     Final   Value: NO WBC SEEN     NO SQUAMOUS EPITHELIAL CELLS SEEN     NO ORGANISMS SEEN     Performed at [facility]   Culture     Final   Value: NO GROWTH 1 DAY     Performed at [facility]   Report Status PENDING   Incomplete  FUNGUS CULTURE W SMEAR     Status: None   Collection Time    06/07/13 12:38 PM      Result Value Ref Range Status   Specimen Description TISSUE BONE MARROW   Final   Special Requests NONE   Final   Fungal Smear     Final   Value: NO YEAST OR FUNGAL ELEMENTS SEEN     Performed at [facility]   Culture     Final   Value: CULTURE IN PROGRESS FOR FOUR WEEKS     Performed at [facility]   Report Status PENDING   Incomplete  AFB CULTURE WITH SMEAR     Status: None   Collection Time    06/07/13 12:38 PM      Result Value Ref Range Status   Specimen Description TISSUE BONE MARROW   Final   Special Requests NONE   Final   ACID FAST SMEAR     Final   Value: NO ACID FAST BACILLI SEEN     Performed at [facility]   Culture     Final   Value: CULTURE WILL BE EXAMINED FOR 6 WEEKS BEFORE ISSUING A FINAL REPORT     Performed at [facility]   Report Status  PENDING   Incomplete     Labs: Basic Metabolic Panel:  Recent Labs Lab 06/02/13 1800 06/03/13 0338 06/04/13 0237 06/06/13 0643 06/08/13 0450 06/09/13 0345  NA 132* 133* 139 139 138  --   K 3.8 3.3* 4.1 3.5*  3.6*  --   CL 95* 97 99 102 103  --   CO2 _0 --   GLUCOSE 100* 106* 101* 100* 99  --   BUN _1 --   CREATININE 1.03 0.93 0.81 0.75 0.65 0.66  CALCIUM 9.4 9.1 10.0 9.2 9.2  --    Liver Function Tests: No results found for this basename: AST, ALT, ALKPHOS, BILITOT, PROT, ALBUMIN,  in the last 168 hours  Recent Labs Lab 06/08/13 0450  LIPASE 13   No results found for this basename: AMMONIA,  in the last 168 hours CBC:  Recent Labs Lab 06/05/13 0401 06/06/13 0643 06/07/13 0500 06/08/13 0450 06/09/13 0345  WBC 2.5* 1.8* 2.7* 2.5* 2.8*  NEUTROABS 0.0* 0.0* 0.0* 0.0* 0.0*  HGB 9.0* 8.9* 8.7* 8.5* 8.8*  HCT 28.2* 27.9* 27.6* 27.2* 27.9*  MCV 73.2* 73.6* 74.0* 74.3* 73.6*  PLT 365 345 314 301 333   Cardiac Enzymes:  Recent Labs Lab 06/02/13 1800 06/08/13 0450 06/08/13 1335 06/08/13 1952  TROPONINI <0.30 <0.30 <0.30 <0.30   BNP: BNP (last 3 results)  Recent Labs  04/16/13 1616 05/02/13 1010 06/02/13 1800  PROBNP 415.6* 132.0* 500.8*   CBG: No results found for this basename: GLUCAP,  in the last 168 hours     Signed:  [place] L  [facility] 06/09/2013, 9:01 AM

## 2013-06-11 LAB — TISSUE CULTURE
Culture: NO GROWTH
GRAM STAIN: NONE SEEN

## 2013-06-12 ENCOUNTER — Other Ambulatory Visit: Payer: Self-pay | Admitting: Cardiology

## 2013-06-14 ENCOUNTER — Ambulatory Visit (HOSPITAL_BASED_OUTPATIENT_CLINIC_OR_DEPARTMENT_OTHER): Payer: No Typology Code available for payment source | Admitting: Hematology and Oncology

## 2013-06-14 ENCOUNTER — Telehealth: Payer: Self-pay | Admitting: Hematology and Oncology

## 2013-06-14 ENCOUNTER — Other Ambulatory Visit (HOSPITAL_BASED_OUTPATIENT_CLINIC_OR_DEPARTMENT_OTHER): Payer: No Typology Code available for payment source

## 2013-06-14 VITALS — BP 110/73 | HR 94 | Temp 98.6°F | Resp 18 | Ht 66.0 in | Wt 173.5 lb

## 2013-06-14 DIAGNOSIS — R599 Enlarged lymph nodes, unspecified: Secondary | ICD-10-CM

## 2013-06-14 DIAGNOSIS — D72819 Decreased white blood cell count, unspecified: Secondary | ICD-10-CM

## 2013-06-14 DIAGNOSIS — D649 Anemia, unspecified: Secondary | ICD-10-CM

## 2013-06-14 DIAGNOSIS — R5081 Fever presenting with conditions classified elsewhere: Principal | ICD-10-CM

## 2013-06-14 DIAGNOSIS — Z224 Carrier of infections with a predominantly sexual mode of transmission: Secondary | ICD-10-CM

## 2013-06-14 DIAGNOSIS — R591 Generalized enlarged lymph nodes: Secondary | ICD-10-CM

## 2013-06-14 DIAGNOSIS — D539 Nutritional anemia, unspecified: Secondary | ICD-10-CM

## 2013-06-14 DIAGNOSIS — D709 Neutropenia, unspecified: Secondary | ICD-10-CM

## 2013-06-14 LAB — COMPREHENSIVE METABOLIC PANEL (CC13)
ALK PHOS: 87 U/L (ref 40–150)
ALT: 45 U/L (ref 0–55)
AST: 41 U/L — ABNORMAL HIGH (ref 5–34)
Albumin: 2.5 g/dL — ABNORMAL LOW (ref 3.5–5.0)
Anion Gap: 9 mEq/L (ref 3–11)
BUN: 20.8 mg/dL (ref 7.0–26.0)
CO2: 23 mEq/L (ref 22–29)
CREATININE: 0.8 mg/dL (ref 0.6–1.1)
Calcium: 9.1 mg/dL (ref 8.4–10.4)
Chloride: 102 mEq/L (ref 98–109)
Glucose: 111 mg/dl (ref 70–140)
POTASSIUM: 4.4 meq/L (ref 3.5–5.1)
Sodium: 133 mEq/L — ABNORMAL LOW (ref 136–145)
Total Bilirubin: 0.24 mg/dL (ref 0.20–1.20)
Total Protein: 8.7 g/dL — ABNORMAL HIGH (ref 6.4–8.3)

## 2013-06-14 LAB — CHROMOSOME ANALYSIS, BONE MARROW

## 2013-06-14 LAB — CBC & DIFF AND RETIC
BASO%: 0 % (ref 0.0–2.0)
BASOS ABS: 0 10*3/uL (ref 0.0–0.1)
EOS ABS: 0.1 10*3/uL (ref 0.0–0.5)
EOS%: 3.1 % (ref 0.0–7.0)
HCT: 25.6 % — ABNORMAL LOW (ref 34.8–46.6)
HEMOGLOBIN: 8.3 g/dL — AB (ref 11.6–15.9)
Immature Retic Fract: 6.6 % (ref 1.60–10.00)
LYMPH%: 65.1 % — ABNORMAL HIGH (ref 14.0–49.7)
MCH: 23.6 pg — ABNORMAL LOW (ref 25.1–34.0)
MCHC: 32.3 g/dL (ref 31.5–36.0)
MCV: 73.2 fL — AB (ref 79.5–101.0)
MONO#: 0.6 10*3/uL (ref 0.1–0.9)
MONO%: 31.2 % — AB (ref 0.0–14.0)
NEUT%: 0.6 % — ABNORMAL LOW (ref 38.4–76.8)
NEUTROS ABS: 0 10*3/uL — AB (ref 1.5–6.5)
Platelets: 346 10*3/uL (ref 145–400)
RBC: 3.5 10*6/uL — ABNORMAL LOW (ref 3.70–5.45)
RDW: 19.1 % — AB (ref 11.2–14.5)
RETIC %: 2.08 % (ref 0.70–2.10)
Retic Ct Abs: 72.8 10*3/uL (ref 33.70–90.70)
WBC: 1.8 10*3/uL — ABNORMAL LOW (ref 3.9–10.3)
lymph#: 1.2 10*3/uL (ref 0.9–3.3)

## 2013-06-14 LAB — HOLD TUBE, BLOOD BANK

## 2013-06-14 MED ORDER — PREDNISONE 20 MG PO TABS: 60.0000 mg | ORAL_TABLET | Freq: Every day | ORAL | Status: DC

## 2013-06-14 NOTE — Progress Notes (Signed)
[place] OFFICE PROGRESS NOTE  Morgan Chessman, MD DIAGNOSIS:  Severe pancytopenia, lymphadenopathy, syphilis, for further management.  SUMMARY OF HEMATOLOGIC HISTORY: [name] who is being referred here because of background of severe pancytopenia, lymphadenopathy and abnormal lymph node and bone marrow biopsy. Further evaluation suspect the patient may have syphilis. She was treated with doxycycline with worsening pancytopenia. Her antibiotic treatment was discontinued. From 06/02/2013 to 06/09/2013, the patient was admitted to the hospital for further workup for neutropenic fever. She was placed on G-CSF with no response to therapy. Repeat bone marrow biopsy was done. The patient was given broad-spectrum intravenous antibiotics and subsequently discharged for further followup as an outpatient. INTERVAL HISTORY: Morgan Bean 48 y.o. female returns for further evaluation. She is to have poor appetite and fatigue. She felt that her gums swelling is starting to get a bit worse again. Denies any fevers or chills. No abnormal cough.  I have reviewed the past medical history, past surgical history, social history and family history with the patient and they are unchanged from previous note.  ALLERGIES:  is allergic to ciprofloxacin and lipitor.  MEDICATIONS:  Current Outpatient Prescriptions  Medication Sig Dispense Refill  . albuterol (PROVENTIL HFA;VENTOLIN HFA) 108 (90 BASE) MCG/ACT inhaler Inhale 2 puffs into the lungs every 4 (four) hours as needed for wheezing or shortness of breath.      [name] amLODipine (NORVASC) 10 MG tablet TAKE ONE TABLET DAILY  30 tablet  1  . aspirin 81 MG tablet Take 81 mg by mouth daily.      [name] buPROPion (WELLBUTRIN) 100 MG tablet Take 100 mg by mouth 2 (two) times daily.      . citalopram (CELEXA) 40 MG tablet Take 40 mg by mouth daily.      . cloNIDine (CATAPRES) 0.3 MG tablet Take 0.3 mg by mouth 2 (two) times  daily.      . furosemide (LASIX) 20 MG tablet Take 1 tablet (20 mg total) by mouth 2 (two) times daily.  60 tablet  11  . hydrOXYzine (ATARAX/VISTARIL) 25 MG tablet Take 25 mg by mouth at bedtime as needed (sleep).      [name] levofloxacin (LEVAQUIN) 500 MG tablet Take 1 tablet (500 mg total) by mouth daily.  6 tablet  0  . loratadine (CLARITIN) 10 MG tablet Take 1 tablet (10 mg total) by mouth daily.  30 tablet  3  . Multiple Vitamin (MULTIVITAMIN WITH MINERALS) TABS Take 1 tablet by mouth daily.      . polyethylene glycol powder (GLYCOLAX/MIRALAX) powder Take 17 g by mouth daily as needed (constipation).       . potassium chloride (K-DUR,KLOR-CON) 10 MEQ tablet Take 1 tablet (10 mEq total) by mouth 2 (two) times daily with a meal.  60 tablet  11  . traZODone (DESYREL) 100 MG tablet Take 100 mg by mouth at bedtime.      . predniSONE (DELTASONE) 20 MG tablet Take 3 tablets (60 mg total) by mouth daily with breakfast.  90 tablet  2   No current facility-administered medications for this visit.     REVIEW OF SYSTEMS:   Eyes: Denies blurriness of vision Ears, nose, mouth, throat, and face: Denies mucositis or sore throat Cardiovascular: Denies palpitation, chest discomfort or lower extremity swelling Gastrointestinal:  Denies nausea, heartburn or change in bowel habits Skin: Denies abnormal skin rashes Neurological:Denies numbness, tingling or new weaknesses Behavioral/Psych: Mood is stable, no new changes  All other  systems were reviewed with the patient and are negative.  PHYSICAL EXAMINATION: ECOG PERFORMANCE STATUS: 1 - Symptomatic but completely ambulatory  Filed Vitals:   06/14/13 0815  BP: 110/73  Pulse: 94  Temp: 98.6 F (37 C)  Resp: 18   Filed Weights   06/14/13 0815  Weight: 173 lb 8 oz (78.699 kg)    GENERAL:alert, no distress and comfortable. She looked pale SKIN: skin color, texture, turgor are normal, no rashes or significant lesions EYES: normal, Conjunctiva are pale  and non-injected, sclera clear OROPHARYNX: No mucositis or thrush. Persistent gum swelling, slightly worse today compared to last week. NECK: supple, thyroid normal size, non-tender, without nodularity LYMPH:  Palpable lymphadenopathy in the neck, none elsewhere  LUNGS: clear to auscultation and percussion with normal breathing effort HEART: regular rate & rhythm and no murmurs and no lower extremity edema ABDOMEN:abdomen soft, non-tender and normal bowel sounds Musculoskeletal:no cyanosis of digits and no clubbing  NEURO: alert & oriented x 3 with fluent speech, no focal motor/sensory deficits  LABORATORY DATA:  I have reviewed the data as listed Results for orders placed in visit on 06/14/13 (from the past 48 hour(s))  CBC & DIFF AND RETIC     Status: Abnormal   Collection Time    06/14/13  7:58 AM      Result Value Ref Range   WBC 1.8 (*) 3.9 - 10.3 10e3/uL   NEUT# 0.0 (*) 1.5 - 6.5 10e3/uL   HGB 8.3 (*) 11.6 - 15.9 g/dL   HCT 25.6 (*) 34.8 - 46.6 %   Platelets 346  145 - 400 10e3/uL   MCV 73.2 (*) 79.5 - 101.0 fL   MCH 23.6 (*) 25.1 - 34.0 pg   MCHC 32.3  31.5 - 36.0 g/dL   RBC 3.50 (*) 3.70 - 5.45 10e6/uL   RDW 19.1 (*) 11.2 - 14.5 %   lymph# 1.2  0.9 - 3.3 10e3/uL   MONO# 0.6  0.1 - 0.9 10e3/uL   Eosinophils Absolute 0.1  0.0 - 0.5 10e3/uL   Basophils Absolute 0.0  0.0 - 0.1 10e3/uL   NEUT% 0.6 (*) 38.4 - 76.8 %   LYMPH% 65.1 (*) 14.0 - 49.7 %   MONO% 31.2 (*) 0.0 - 14.0 %   EOS% 3.1  0.0 - 7.0 %   BASO% 0.0  0.0 - 2.0 %   Retic % 2.08  0.70 - 2.10 %   Retic Ct Abs 72.80  33.70 - 90.70 10e3/uL   Immature Retic Fract 6.60  1.60 - 10.00 %  HOLD TUBE, BLOOD BANK     Status: None   Collection Time    06/14/13  7:58 AM      Result Value Ref Range   Hold Tube, Blood Bank Blood Bank Order Cancelled    COMPREHENSIVE METABOLIC PANEL (UX32)     Status: Abnormal   Collection Time    06/14/13  7:58 AM      Result Value Ref Range   Sodium 133 (*) 136 - 145 mEq/L   Potassium  4.4  3.5 - 5.1 mEq/L   Chloride 102  98 - 109 mEq/L   CO2 23  22 - 29 mEq/L   Glucose 111  70 - 140 mg/dl   BUN 20.8  7.0 - 26.0 mg/dL   Creatinine 0.8  0.6 - 1.1 mg/dL   Total Bilirubin 0.24  0.20 - 1.20 mg/dL   Alkaline Phosphatase 87  40 - 150 U/L   AST 41 (*)  5 - 34 U/L   ALT 45  0 - 55 U/L   Total Protein 8.7 (*) 6.4 - 8.3 g/dL   Albumin 2.5 (*) 3.5 - 5.0 g/dL   Calcium 9.1  8.4 - 10.4 mg/dL   Anion Gap 9  3 - 11 mEq/L    Lab Results  Component Value Date   WBC 1.8* 06/14/2013   HGB 8.3* 06/14/2013   HCT 25.6* 06/14/2013   MCV 73.2* 06/14/2013   PLT 346 06/14/2013   final bone marrow result is still pending.   ASSESSMENT & PLAN:  #1 severe leukopenia #2 anemia #3 abnormal lymphadenopathy I have discussed with the hematopathologist several times this week. 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 I discussed with the patient potential referral to another hospital for second opinion versus a trial of corticosteroids. After a prolonged discussion, she is in agreement to try prednisone daily. I will see her back next week to assess response to treatment. From the anemia standpoint, she does not require transfusion.  I will review her case in the next hematology tumor board. #4 recent neutropenic fever She will complete antibiotic therapy today. I recommend observation. 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 She'll followup with infectious disease team as an outpatient. All questions were answered. The patient knows to call the clinic with any problems, questions or concerns. No barriers to learning was detected.  I spent 25 minutes counseling the patient face to face. The total time spent in the appointment was 40 minutes and more than 50% was on counseling.     [place], [place], MD 06/14/2013 9:18 AM

## 2013-06-14 NOTE — Telephone Encounter (Signed)
gv adn printed aptp sched and avs for pt for april.Marland KitchenMarland Kitchen

## 2013-06-19 ENCOUNTER — Encounter: Payer: Self-pay | Admitting: Infectious Disease

## 2013-06-19 ENCOUNTER — Encounter (HOSPITAL_COMMUNITY): Payer: Self-pay

## 2013-06-19 ENCOUNTER — Ambulatory Visit (INDEPENDENT_AMBULATORY_CARE_PROVIDER_SITE_OTHER): Payer: No Typology Code available for payment source | Admitting: Infectious Disease

## 2013-06-19 VITALS — BP 104/73 | HR 77 | Temp 98.0°F | Wt 178.0 lb

## 2013-06-19 DIAGNOSIS — D649 Anemia, unspecified: Secondary | ICD-10-CM

## 2013-06-19 DIAGNOSIS — M310 Hypersensitivity angiitis: Secondary | ICD-10-CM

## 2013-06-19 DIAGNOSIS — R509 Fever, unspecified: Secondary | ICD-10-CM

## 2013-06-19 DIAGNOSIS — D709 Neutropenia, unspecified: Secondary | ICD-10-CM

## 2013-06-19 DIAGNOSIS — D72819 Decreased white blood cell count, unspecified: Secondary | ICD-10-CM

## 2013-06-19 DIAGNOSIS — I889 Nonspecific lymphadenitis, unspecified: Secondary | ICD-10-CM

## 2013-06-19 DIAGNOSIS — A539 Syphilis, unspecified: Secondary | ICD-10-CM

## 2013-06-19 NOTE — Progress Notes (Signed)
Subjective:    Patient ID: Morgan Bean, female    DOB: May 30, 1965, 48 y.o.   MRN: [id number]  HPI  48 year old with pancytopenia and cervical lymphadenopathy leukopenia, neutropenia and anemia, along with cervical lymphadenopathy.  She also had + HIV EIA but discriminatory test was negative for HIV1, HIV2 and HIV 1 RNA, ultraquant have been negative.   She underwent excisional LN biopsy and NP bx both of which were negative fo maligancy in January. She has had bone marrow biopsy in January that showed hypercellular marrow with slight plasma cell predominance. She underwent another BMBx on 06/07/13 with similar picture. FISH was negative. AFB and fungal stains and culture have been negative and NGTD.  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 She received GCSF without response in her neutropenia. ANA is negative in January, (was + in 2011 with negative ds dna). She has been followed by Dr. Alvy Bimler with Oncology who has just started her on an empiric trial of corticosteroids. Patient states that she feels better on steroids so far.  We discussed other possible tests to workup ID or AI cause.   She did have notabley high ESR and CRP.     Review of Systems  Constitutional: Positive for fatigue. Negative for fever, chills, diaphoresis, activity change, appetite change and unexpected weight change.  HENT: Positive for sore throat. Negative for congestion, rhinorrhea, sinus pressure, sneezing and trouble swallowing.   Eyes: Negative for photophobia and visual disturbance.  Respiratory: Negative for cough, chest tightness and shortness of breath.   Gastrointestinal: Negative for nausea, vomiting, abdominal pain, diarrhea and abdominal  distention.  Genitourinary: Negative for dysuria, hematuria and flank pain.  Musculoskeletal: Negative for arthralgias, back pain, gait problem, joint swelling and myalgias.  Skin: Negative for color change, pallor, rash and wound.  Neurological: Negative for dizziness, tremors, weakness and light-headedness.  Hematological: Positive for adenopathy. Does not bruise/bleed easily.  Psychiatric/Behavioral: Negative for behavioral problems, confusion, sleep disturbance, dysphoric mood, decreased concentration and agitation.       Objective:   Physical Exam  Nursing note and vitals reviewed. Constitutional: She is oriented to person, place, and time. She appears well-developed and well-nourished. No distress.  HENT:  Head: Normocephalic and atraumatic.  Mouth/Throat: Oropharynx is clear and moist. No oropharyngeal exudate.  Eyes: Conjunctivae and EOM are normal. No scleral icterus.  Neck: Normal range of motion. Neck supple. No JVD present.    Cardiovascular: Normal rate, regular rhythm and normal heart sounds.  Exam reveals no gallop and no friction rub.   No murmur heard. Pulmonary/Chest: Effort normal and breath sounds normal. No respiratory distress. She has no wheezes. She has no rales. She exhibits no tenderness.  Abdominal: She exhibits no distension and no mass. There is no tenderness. There is no rebound and no guarding.  Musculoskeletal: She exhibits no edema and no tenderness.  Lymphadenopathy:    She has cervical adenopathy.  Neurological: She is alert and oriented to person, place, and time. She has normal reflexes. She exhibits normal muscle tone. Coordination normal.  Skin: Skin is warm and dry. She is not diaphoretic. No erythema. No pallor.  Psychiatric: She has a normal mood and affect. Her behavior is normal. Judgment and  thought content normal.          Assessment & Plan:   #1 FUO, neutropenia, anemia and cervical lymphadenopathy:  I think the initiation of  steroids for possible auto-immune process seems rational. 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 Given her hx of + polysubstance abuse I wonder if this could be related to a levimislole (horse -deworming agent used in cocaine products) induced vasculitis  I will for thoroughness check EBV, CMV serologies, Toxoplasma IgG, QF gold,  I will check SSA/SSB abs, RF,   Will followup culture data and labs  rtc in one month  I will go ahead and repeat her CBC today and CMP as well I spent greater than 25 minutes with the patient including greater than 50% of time in face to face counsel of the patient and in coordination of their care.  #2 Syphilis: with low titer, seems much more c/w prior disease  #3 HIV EIA +, was false + as RNA negative and discriminatory test negative

## 2013-06-20 ENCOUNTER — Other Ambulatory Visit (INDEPENDENT_AMBULATORY_CARE_PROVIDER_SITE_OTHER): Payer: No Typology Code available for payment source

## 2013-06-20 ENCOUNTER — Telehealth: Payer: Self-pay | Admitting: Licensed Clinical Social Worker

## 2013-06-20 DIAGNOSIS — I2510 Atherosclerotic heart disease of native coronary artery without angina pectoris: Secondary | ICD-10-CM

## 2013-06-20 DIAGNOSIS — E785 Hyperlipidemia, unspecified: Secondary | ICD-10-CM

## 2013-06-20 LAB — CBC WITH DIFFERENTIAL/PLATELET
Basophils Absolute: 0 10*3/uL (ref 0.0–0.1)
Basophils Relative: 1 % (ref 0–1)
EOS PCT: 0 % (ref 0–5)
Eosinophils Absolute: 0 10*3/uL (ref 0.0–0.7)
HCT: 28.3 % — ABNORMAL LOW (ref 36.0–46.0)
Hemoglobin: 9.1 g/dL — ABNORMAL LOW (ref 12.0–15.0)
LYMPHS PCT: 77 % — AB (ref 12–46)
Lymphs Abs: 0.7 10*3/uL (ref 0.7–4.0)
MCH: 23.3 pg — ABNORMAL LOW (ref 26.0–34.0)
MCHC: 32.2 g/dL (ref 30.0–36.0)
MCV: 72.4 fL — ABNORMAL LOW (ref 78.0–100.0)
Monocytes Absolute: 0.2 10*3/uL (ref 0.1–1.0)
Monocytes Relative: 22 % — ABNORMAL HIGH (ref 3–12)
NEUTROS ABS: 0 10*3/uL — AB (ref 1.7–7.7)
Neutrophils Relative %: 0 % — ABNORMAL LOW (ref 43–77)
PLATELETS: 482 10*3/uL — AB (ref 150–400)
RBC: 3.91 MIL/uL (ref 3.87–5.11)
RDW: 18.6 % — ABNORMAL HIGH (ref 11.5–15.5)
WBC: 0.9 10*3/uL — AB (ref 4.0–10.5)

## 2013-06-20 LAB — LIPID PANEL
CHOL/HDL RATIO: 4
Cholesterol: 168 mg/dL (ref 0–200)
HDL: 37.9 mg/dL — ABNORMAL LOW (ref >39.00)
LDL Cholesterol: 106 mg/dL — ABNORMAL HIGH (ref 0–99)
Triglycerides: 122 mg/dL (ref 0.0–149.0)
VLDL: 24.4 mg/dL (ref 0.0–40.0)

## 2013-06-20 LAB — TOXOPLASMA ANTIBODIES- IGG AND  IGM: Toxoplasma Antibody- IgM: <3 IU/mL (ref <8.0)

## 2013-06-20 LAB — ANTIPHOSPHOLIPID SYNDROME EVAL, BLD
ANTICARDIOLIPIN IGG: 8 GPL U/mL (ref <23)
ANTICARDIOLIPIN IGM: 12 [MPL'U]/mL — AB (ref <11)
Anticardiolipin IgA: 9 APL U/mL (ref <22)
DRVVT: 34.3 s (ref <42.9)
Lupus Anticoagulant: NOT DETECTED
PTT LA: 27.5 s — AB (ref 28.0–43.0)
Phosphatidylserine IgG Autoantibodies: 9 U/mL (ref <16)
Phosphatydalserine, IgA: 7 U/mL (ref <20)
Phosphatydalserine, IgM: 15 U/mL (ref <22)

## 2013-06-20 LAB — EPSTEIN-BARR VIRUS VCA ANTIBODY PANEL
EBV NA IGG: 396 U/mL — AB (ref <18.0)
EBV VCA IgG: 431 U/mL — ABNORMAL HIGH (ref <18.0)

## 2013-06-20 LAB — CMV IGM: CMV IgM: <8 AU/mL (ref <30.00)

## 2013-06-20 LAB — CYTOMEGALOVIRUS ANTIBODY, IGG: Cytomegalovirus Ab-IgG: 6.9 U/mL — ABNORMAL HIGH (ref <0.60)

## 2013-06-20 LAB — HEPATIC FUNCTION PANEL
ALBUMIN: 2.6 g/dL — AB (ref 3.5–5.2)
ALT: 27 U/L (ref 0–35)
AST: 19 U/L (ref 0–37)
Alkaline Phosphatase: 69 U/L (ref 39–117)
BILIRUBIN TOTAL: 0.4 mg/dL (ref 0.3–1.2)
Bilirubin, Direct: 0 mg/dL (ref 0.0–0.3)
Total Protein: 7.8 g/dL (ref 6.0–8.3)

## 2013-06-20 LAB — SJOGREN'S SYNDROME ANTIBODS(SSA + SSB)
SSA (Ro) (ENA) Antibody, IgG: <1
SSB (La) (ENA) Antibody, IgG: <1

## 2013-06-20 LAB — ANGIOTENSIN CONVERTING ENZYME: ANGIOTENSIN-CONVERTING ENZYME: 32 U/L (ref 8–52)

## 2013-06-20 LAB — RHEUMATOID FACTOR

## 2013-06-20 NOTE — Telephone Encounter (Signed)
Mary from [facility] called an alert value for WBC 0.9 it was repeated and verified.

## 2013-06-20 NOTE — Telephone Encounter (Signed)
Saw that. She is going to be seen by Dr. Alvy Bimler

## 2013-06-23 ENCOUNTER — Other Ambulatory Visit (HOSPITAL_BASED_OUTPATIENT_CLINIC_OR_DEPARTMENT_OTHER): Payer: No Typology Code available for payment source

## 2013-06-23 ENCOUNTER — Telehealth: Payer: Self-pay | Admitting: Hematology and Oncology

## 2013-06-23 ENCOUNTER — Ambulatory Visit (HOSPITAL_BASED_OUTPATIENT_CLINIC_OR_DEPARTMENT_OTHER): Payer: No Typology Code available for payment source | Admitting: Hematology and Oncology

## 2013-06-23 ENCOUNTER — Encounter: Payer: Self-pay | Admitting: Hematology and Oncology

## 2013-06-23 VITALS — BP 105/70 | HR 78 | Temp 98.1°F | Resp 20 | Ht 66.0 in | Wt 182.2 lb

## 2013-06-23 DIAGNOSIS — D709 Neutropenia, unspecified: Secondary | ICD-10-CM

## 2013-06-23 DIAGNOSIS — D4959 Neoplasm of unspecified behavior of other genitourinary organ: Secondary | ICD-10-CM

## 2013-06-23 DIAGNOSIS — R599 Enlarged lymph nodes, unspecified: Secondary | ICD-10-CM

## 2013-06-23 DIAGNOSIS — D72819 Decreased white blood cell count, unspecified: Secondary | ICD-10-CM

## 2013-06-23 DIAGNOSIS — B370 Candidal stomatitis: Secondary | ICD-10-CM

## 2013-06-23 DIAGNOSIS — R591 Generalized enlarged lymph nodes: Secondary | ICD-10-CM

## 2013-06-23 DIAGNOSIS — D649 Anemia, unspecified: Secondary | ICD-10-CM

## 2013-06-23 DIAGNOSIS — B379 Candidiasis, unspecified: Secondary | ICD-10-CM

## 2013-06-23 DIAGNOSIS — R5081 Fever presenting with conditions classified elsewhere: Secondary | ICD-10-CM

## 2013-06-23 DIAGNOSIS — D539 Nutritional anemia, unspecified: Secondary | ICD-10-CM

## 2013-06-23 HISTORY — DX: Candidiasis, unspecified: B37.9

## 2013-06-23 LAB — CBC & DIFF AND RETIC
BASO%: 0 % (ref 0.0–2.0)
BASOS ABS: 0 10*3/uL (ref 0.0–0.1)
EOS%: 0.3 % (ref 0.0–7.0)
Eosinophils Absolute: 0 10*3/uL (ref 0.0–0.5)
HEMATOCRIT: 29.6 % — AB (ref 34.8–46.6)
HEMOGLOBIN: 9.2 g/dL — AB (ref 11.6–15.9)
IMMATURE RETIC FRACT: 16.2 % — AB (ref 1.60–10.00)
LYMPH%: 52 % — AB (ref 14.0–49.7)
MCH: 23.6 pg — ABNORMAL LOW (ref 25.1–34.0)
MCHC: 31.2 g/dL — AB (ref 31.5–36.0)
MCV: 75.8 fL — ABNORMAL LOW (ref 79.5–101.0)
MONO#: 0.6 10*3/uL (ref 0.1–0.9)
MONO%: 47.2 % — ABNORMAL HIGH (ref 0.0–14.0)
NEUT%: 0.5 % — ABNORMAL LOW (ref 38.4–76.8)
NEUTROS ABS: 0 10*3/uL — AB (ref 1.5–6.5)
PLATELETS: 399 10*3/uL (ref 145–400)
RBC: 3.91 10*6/uL (ref 3.70–5.45)
RDW: 21 % — ABNORMAL HIGH (ref 11.2–14.5)
Retic %: 5.66 % — ABNORMAL HIGH (ref 0.70–2.10)
Retic Ct Abs: 22.2 10*3/uL — ABNORMAL LOW (ref 33.70–90.70)
WBC: 1.4 10*3/uL — ABNORMAL LOW (ref 3.9–10.3)
lymph#: 0.7 10*3/uL — ABNORMAL LOW (ref 0.9–3.3)

## 2013-06-23 LAB — COMPREHENSIVE METABOLIC PANEL (CC13)
ALBUMIN: 2.5 g/dL — AB (ref 3.5–5.0)
ALT: 17 U/L (ref 0–55)
ANION GAP: 12 meq/L — AB (ref 3–11)
AST: 9 U/L (ref 5–34)
Alkaline Phosphatase: 81 U/L (ref 40–150)
BILIRUBIN TOTAL: 0.21 mg/dL (ref 0.20–1.20)
BUN: 16.6 mg/dL (ref 7.0–26.0)
CO2: 23 mEq/L (ref 22–29)
Calcium: 9.6 mg/dL (ref 8.4–10.4)
Chloride: 105 mEq/L (ref 98–109)
Creatinine: 0.8 mg/dL (ref 0.6–1.1)
GLUCOSE: 119 mg/dL (ref 70–140)
Potassium: 4.3 mEq/L (ref 3.5–5.1)
SODIUM: 140 meq/L (ref 136–145)
TOTAL PROTEIN: 8.5 g/dL — AB (ref 6.4–8.3)

## 2013-06-23 LAB — HOLD TUBE, BLOOD BANK

## 2013-06-23 MED ORDER — FLUCONAZOLE 100 MG PO TABS: 100.0000 mg | ORAL_TABLET | Freq: Every day | ORAL | Status: DC

## 2013-06-23 NOTE — Telephone Encounter (Signed)
gv adn printed aptp sched and avs for pt for April °

## 2013-06-23 NOTE — Progress Notes (Signed)
[place] OFFICE PROGRESS NOTE  [name], Morgan Dare, MD DIAGNOSIS:  Severe pancytopenia, lymphadenopathy, recent treatment for syphilis  SUMMARY OF HEMATOLOGIC HISTORY: [name] who is being referred here because of background of severe pancytopenia, lymphadenopathy and abnormal lymph node and bone marrow biopsy. Further evaluation suspect the patient may have syphilis. She was treated with doxycycline with worsening pancytopenia. Her antibiotic treatment was discontinued. From 06/02/2013 to 06/09/2013, the patient was admitted to the hospital for further workup for neutropenic fever. She was placed on G-CSF with no response to therapy. Repeat bone marrow biopsy was done. The patient was given broad-spectrum intravenous antibiotics and subsequently discharged for further followup as an outpatient. On 06/14/2013, I started her on prednisone 60 mg daily. INTERVAL HISTORY: Morgan Bean 48 y.o. female returns for further followup. Her appetite is slightly improved. She still complaining of profound fatigue. She denies worsening of gum swelling. She denies any recent fever, chills, night sweats or abnormal weight loss  I have reviewed the past medical history, past surgical history, social history and family history with the patient and they are unchanged from previous note.  ALLERGIES:  is allergic to ciprofloxacin and lipitor.  MEDICATIONS:  Current Outpatient Prescriptions  Medication Sig Dispense Refill  . amLODipine (NORVASC) 10 MG tablet TAKE ONE TABLET DAILY  30 tablet  1  . aspirin 81 MG tablet Take 81 mg by mouth daily.      [name] buPROPion (WELLBUTRIN) 100 MG tablet Take 100 mg by mouth 2 (two) times daily.      . citalopram (CELEXA) 40 MG tablet Take 40 mg by mouth daily.      . cloNIDine (CATAPRES) 0.3 MG tablet Take 0.3 mg by mouth 2 (two) times daily.      . furosemide (LASIX) 20 MG tablet Take 1 tablet (20 mg total) by mouth 2 (two) times  daily.  60 tablet  11  . hydrOXYzine (ATARAX/VISTARIL) 25 MG tablet Take 25 mg by mouth at bedtime as needed (sleep).      . loratadine (CLARITIN) 10 MG tablet Take 1 tablet (10 mg total) by mouth daily.  30 tablet  3  . Multiple Vitamin (MULTIVITAMIN WITH MINERALS) TABS Take 1 tablet by mouth daily.      [name] omeprazole (PRILOSEC) 40 MG capsule Take 40 mg by mouth daily.      . polyethylene glycol powder (GLYCOLAX/MIRALAX) powder Take 17 g by mouth daily as needed (constipation).       . potassium chloride (K-DUR,KLOR-CON) 10 MEQ tablet Take 1 tablet (10 mEq total) by mouth 2 (two) times daily with a meal.  60 tablet  11  . predniSONE (DELTASONE) 20 MG tablet Take 3 tablets (60 mg total) by mouth daily with breakfast.  90 tablet  2  . traZODone (DESYREL) 100 MG tablet Take 100 mg by mouth at bedtime.      [name] albuterol (PROVENTIL HFA;VENTOLIN HFA) 108 (90 BASE) MCG/ACT inhaler Inhale 2 puffs into the lungs every 4 (four) hours as needed for wheezing or shortness of breath.      . fluconazole (DIFLUCAN) 100 MG tablet Take 1 tablet (100 mg total) by mouth daily.  7 tablet  0   No current facility-administered medications for this visit.     REVIEW OF SYSTEMS:   Eyes: Denies blurriness of vision Ears, nose, mouth, throat, and face: Denies mucositis or sore throat Respiratory: Denies cough, dyspnea or wheezes Cardiovascular: Denies palpitation, chest discomfort or lower extremity  swelling Gastrointestinal:  Denies nausea, heartburn or change in bowel habits Skin: Denies abnormal skin rashes Lymphatics: Denies new lymphadenopathy or easy bruising Neurological:Denies numbness, tingling or new weaknesses Behavioral/Psych: Mood is stable, no new changes  All other systems were reviewed with the patient and are negative.  PHYSICAL EXAMINATION: ECOG PERFORMANCE STATUS: 1 - Symptomatic but completely ambulatory  Filed Vitals:   06/23/13 0848  BP: 105/70  Pulse: 78  Temp: 98.1 F (36.7 C)  Resp:  20   Filed Weights   06/23/13 0848  Weight: 182 lb 3.2 oz (82.645 kg)    GENERAL:alert, no distress and comfortable SKIN: skin color, texture, turgor are normal, no rashes or significant lesions. Her prior genital lesions are almost completely resolved EYES: normal, Conjunctiva are pale and non-injected, sclera clear OROPHARYNX:no exudate, no erythema and lips, buccal mucosa, and tongue normal. Noted mild gum swelling. I saw early signs of oral thrush  NECK: supple, thyroid normal size, non-tender, without nodularity LYMPH: Previously palpable lymphadenopathy has slightly regressed in size.  LUNGS: clear to auscultation and percussion with normal breathing effort HEART: regular rate & rhythm and no murmurs and no lower extremity edema ABDOMEN:abdomen soft, non-tender and normal bowel sounds Musculoskeletal:no cyanosis of digits and no clubbing  NEURO: alert & oriented x 3 with fluent speech, no focal motor/sensory deficits  LABORATORY DATA:  I have reviewed the data as listed Results for orders placed in visit on 06/23/13 (from the past 48 hour(s))  CBC & DIFF AND RETIC     Status: Abnormal   Collection Time    06/23/13  8:27 AM      Result Value Ref Range   WBC 1.4 (*) 3.9 - 10.3 10e3/uL   NEUT# 0.0 (*) 1.5 - 6.5 10e3/uL   HGB 9.2 (*) 11.6 - 15.9 g/dL   HCT 29.6 (*) 34.8 - 46.6 %   Platelets 399  145 - 400 10e3/uL   MCV 75.8 (*) 79.5 - 101.0 fL   MCH 23.6 (*) 25.1 - 34.0 pg   MCHC 31.2 (*) 31.5 - 36.0 g/dL   RBC 3.91  3.70 - 5.45 10e6/uL   RDW 21.0 (*) 11.2 - 14.5 %   lymph# 0.7 (*) 0.9 - 3.3 10e3/uL   MONO# 0.6  0.1 - 0.9 10e3/uL   Eosinophils Absolute 0.0  0.0 - 0.5 10e3/uL   Basophils Absolute 0.0  0.0 - 0.1 10e3/uL   NEUT% 0.5 (*) 38.4 - 76.8 %   LYMPH% 52.0 (*) 14.0 - 49.7 %   MONO% 47.2 (*) 0.0 - 14.0 %   EOS% 0.3  0.0 - 7.0 %   BASO% 0.0  0.0 - 2.0 %   Retic % 5.66 (*) 0.70 - 2.10 %   Retic Ct Abs 22.20 (*) 33.70 - 90.70 10e3/uL   Immature Retic Fract 16.20 (*)  1.60 - 10.00 %  HOLD TUBE, BLOOD BANK     Status: None   Collection Time    06/23/13  8:27 AM      Result Value Ref Range   Hold Tube, Blood Bank Blood Bank Order Cancelled    COMPREHENSIVE METABOLIC PANEL (TF57)     Status: Abnormal   Collection Time    06/23/13  8:27 AM      Result Value Ref Range   Sodium 140  136 - 145 mEq/L   Potassium 4.3  3.5 - 5.1 mEq/L   Chloride 105  98 - 109 mEq/L   CO2 23  22 - 29 mEq/L  Glucose 119  70 - 140 mg/dl   BUN 16.6  7.0 - 26.0 mg/dL   Creatinine 0.8  0.6 - 1.1 mg/dL   Total Bilirubin 0.21  0.20 - 1.20 mg/dL   Alkaline Phosphatase 81  40 - 150 U/L   AST 9  5 - 34 U/L   ALT 17  0 - 55 U/L   Total Protein 8.5 (*) 6.4 - 8.3 g/dL   Albumin 2.5 (*) 3.5 - 5.0 g/dL   Calcium 9.6  8.4 - 10.4 mg/dL   Anion Gap 12 (*) 3 - 11 mEq/L    Lab Results  Component Value Date   WBC 1.4* 06/23/2013   HGB 9.2* 06/23/2013   HCT 29.6* 06/23/2013   MCV 75.8* 06/23/2013   PLT 399 06/23/2013   I reviewed her peripheral smear today and confirmed she is to have profound leukopenia. I see some monocytosis and lymphocytosis but no evidence of neutrophils  ASSESSMENT & PLAN:  #1 severe leukopenia #2 anemia #3 abnormal lymphadenopathy, resolving I have discussed with the hematopathologist extensively.  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 I have discussed with the patient potential referral to another hospital for second opinion if her white blood cell count failed to improve in her next visit. I will see her back next week to assess response to treatment. From the anemia standpoint, she does not require transfusion.  I will review her case in the next hematology tumor board. #4 recent neutropenic fever She will complete antibiotic therapy today. I recommend observation. #5 history of syphilis with genital lesion Those lesions are  improving. She'll followup with infectious disease team as an outpatient. #6 oral thrush I will start her on fluconazole for 7 day course.  All questions were answered. The patient knows to call the clinic with any problems, questions or concerns. No barriers to learning was detected.    [place], [place], MD 06/23/2013 9:27 AM

## 2013-06-30 ENCOUNTER — Ambulatory Visit (HOSPITAL_BASED_OUTPATIENT_CLINIC_OR_DEPARTMENT_OTHER): Payer: No Typology Code available for payment source | Admitting: Hematology and Oncology

## 2013-06-30 ENCOUNTER — Telehealth: Payer: Self-pay | Admitting: Hematology and Oncology

## 2013-06-30 ENCOUNTER — Other Ambulatory Visit (HOSPITAL_BASED_OUTPATIENT_CLINIC_OR_DEPARTMENT_OTHER): Payer: No Typology Code available for payment source

## 2013-06-30 VITALS — BP 137/100 | HR 87 | Temp 98.2°F | Resp 20 | Ht 66.0 in | Wt 174.4 lb

## 2013-06-30 DIAGNOSIS — D539 Nutritional anemia, unspecified: Secondary | ICD-10-CM

## 2013-06-30 DIAGNOSIS — D72819 Decreased white blood cell count, unspecified: Secondary | ICD-10-CM

## 2013-06-30 DIAGNOSIS — D649 Anemia, unspecified: Secondary | ICD-10-CM

## 2013-06-30 DIAGNOSIS — R591 Generalized enlarged lymph nodes: Secondary | ICD-10-CM

## 2013-06-30 DIAGNOSIS — N9489 Other specified conditions associated with female genital organs and menstrual cycle: Secondary | ICD-10-CM

## 2013-06-30 DIAGNOSIS — R599 Enlarged lymph nodes, unspecified: Secondary | ICD-10-CM

## 2013-06-30 LAB — COMPREHENSIVE METABOLIC PANEL (CC13)
ALT: 11 U/L (ref 0–55)
ANION GAP: 8 meq/L (ref 3–11)
AST: 9 U/L (ref 5–34)
Albumin: 2.8 g/dL — ABNORMAL LOW (ref 3.5–5.0)
Alkaline Phosphatase: 82 U/L (ref 40–150)
BUN: 14.9 mg/dL (ref 7.0–26.0)
CALCIUM: 9.4 mg/dL (ref 8.4–10.4)
CO2: 25 meq/L (ref 22–29)
CREATININE: 1 mg/dL (ref 0.6–1.1)
Chloride: 105 mEq/L (ref 98–109)
Glucose: 121 mg/dl (ref 70–140)
Potassium: 3.8 mEq/L (ref 3.5–5.1)
Sodium: 139 mEq/L (ref 136–145)
Total Bilirubin: 0.27 mg/dL (ref 0.20–1.20)
Total Protein: 8.4 g/dL — ABNORMAL HIGH (ref 6.4–8.3)

## 2013-06-30 LAB — CBC & DIFF AND RETIC
BASO%: 0 % (ref 0.0–2.0)
Basophils Absolute: 0 10*3/uL (ref 0.0–0.1)
EOS%: 1.4 % (ref 0.0–7.0)
Eosinophils Absolute: 0 10*3/uL (ref 0.0–0.5)
HCT: 32 % — ABNORMAL LOW (ref 34.8–46.6)
HEMOGLOBIN: 10.2 g/dL — AB (ref 11.6–15.9)
Immature Retic Fract: 10.9 % — ABNORMAL HIGH (ref 1.60–10.00)
LYMPH%: 51 % — ABNORMAL HIGH (ref 14.0–49.7)
MCH: 24 pg — AB (ref 25.1–34.0)
MCHC: 31.8 g/dL (ref 31.5–36.0)
MCV: 75.5 fL — AB (ref 79.5–101.0)
MONO#: 1.4 10*3/uL — ABNORMAL HIGH (ref 0.1–0.9)
MONO%: 47 % — AB (ref 0.0–14.0)
NEUT#: 0 10*3/uL — CL (ref 1.5–6.5)
NEUT%: 0.6 % — AB (ref 38.4–76.8)
Platelets: 247 10*3/uL (ref 145–400)
RBC: 4.24 10*6/uL (ref 3.70–5.45)
RDW: 22.4 % — AB (ref 11.2–14.5)
Retic %: 2.49 % — ABNORMAL HIGH (ref 0.70–2.10)
Retic Ct Abs: 105.58 10*3/uL — ABNORMAL HIGH (ref 33.70–90.70)
WBC: 3 10*3/uL — ABNORMAL LOW (ref 3.9–10.3)
lymph#: 1.5 10*3/uL (ref 0.9–3.3)

## 2013-06-30 LAB — HOLD TUBE, BLOOD BANK

## 2013-06-30 NOTE — Progress Notes (Signed)
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 Further evaluation suspect the patient may have syphilis. She was treated with doxycycline with worsening pancytopenia. Her antibiotic treatment was discontinued. From 06/02/2013 to 06/09/2013, the patient was admitted to the hospital for further workup for neutropenic fever. She was placed on G-CSF with no response to therapy. Repeat bone marrow biopsy was done. The patient was given broad-spectrum intravenous antibiotics and subsequently discharged for further followup as an outpatient.  On 06/14/2013, I started her on prednisone 60 mg daily. INTERVAL HISTORY: Morgan Bean 48 y.o. female returns for further followup. The patient felt the best for long time. She denies any fevers or chills. Previously palpable lymphadenopathy has completely resolved.  I have reviewed the past medical history, past surgical history, social history and family history with the patient and they are unchanged from previous note.  ALLERGIES:  is allergic to ciprofloxacin and lipitor.  MEDICATIONS:  Current Outpatient Prescriptions  Medication Sig Dispense Refill  . amLODipine (NORVASC) 10 MG tablet TAKE ONE TABLET DAILY  30 tablet  1  . aspirin 81 MG tablet Take 81 mg by mouth daily.      [name] buPROPion (WELLBUTRIN) 100 MG tablet Take 100 mg by mouth 2 (two) times daily.      . citalopram (CELEXA) 40 MG tablet Take 40 mg by mouth daily.      . cloNIDine (CATAPRES) 0.3 MG tablet Take 0.3 mg by mouth 2 (two) times daily.      . fluconazole (DIFLUCAN) 100 MG tablet Take 1 tablet (100 mg total) by mouth daily.  7 tablet  0  . furosemide (LASIX) 20 MG tablet Take 1 tablet (20 mg total) by  mouth 2 (two) times daily.  60 tablet  11  . hydrOXYzine (ATARAX/VISTARIL) 25 MG tablet Take 25 mg by mouth at bedtime as needed (sleep).      . loratadine (CLARITIN) 10 MG tablet Take 1 tablet (10 mg total) by mouth daily.  30 tablet  3  . Multiple Vitamin (MULTIVITAMIN WITH MINERALS) TABS Take 1 tablet by mouth daily.      [name] omeprazole (PRILOSEC) 40 MG capsule Take 40 mg by mouth daily.      . polyethylene glycol powder (GLYCOLAX/MIRALAX) powder Take 17 g by mouth daily as needed (constipation).       . potassium chloride (K-DUR,KLOR-CON) 10 MEQ tablet Take 1 tablet (10 mEq total) by mouth 2 (two) times daily with a meal.  60 tablet  11  . predniSONE (DELTASONE) 20 MG tablet Take 3 tablets (60 mg total) by mouth daily with breakfast.  90 tablet  2  . traZODone (DESYREL) 100 MG tablet Take 100 mg by mouth at bedtime.      [name] albuterol (PROVENTIL HFA;VENTOLIN HFA) 108 (90 BASE) MCG/ACT inhaler Inhale 2 puffs into the lungs every 4 (four) hours as needed for wheezing or shortness of breath.       No current facility-administered medications for this visit.     REVIEW OF SYSTEMS:   Constitutional: Denies fevers, chills or night sweats Eyes: Denies blurriness of vision All other systems were reviewed with the patient and are negative.  PHYSICAL EXAMINATION: ECOG PERFORMANCE STATUS: 0 - Asymptomatic  Filed Vitals:   06/30/13 2585  BP: 137/100  Pulse: 87  Temp: 98.2 F (36.8 C)  Resp: 20   Filed Weights   06/30/13 0834  Weight: 174 lb 6.4 oz (79.107 kg)    GENERAL:alert, no distress and comfortable SKIN: skin color, texture, turgor are normal, no rashes or significant lesions EYES: normal, Conjunctiva are pink and non-injected, sclera clear OROPHARYNX:no exudate, no erythema and lips, buccal mucosa, and tongue normal . No oral thrush. Poor dentition is noted NECK: supple, thyroid normal size, non-tender, without nodularity LYMPH:  no palpable lymphadenopathy in the cervical,  axillary or inguinal LUNGS: clear to auscultation and percussion with normal breathing effort HEART: regular rate & rhythm and no murmurs and no lower extremity edema ABDOMEN:abdomen soft, non-tender and normal bowel sounds Musculoskeletal:no cyanosis of digits and no clubbing  NEURO: alert & oriented x 3 with fluent speech, no focal motor/sensory deficits  LABORATORY DATA:  I have reviewed the data as listed Results for orders placed in visit on 06/30/13 (from the past 48 hour(s))  CBC & DIFF AND RETIC     Status: Abnormal   Collection Time    06/30/13  8:16 AM      Result Value Ref Range   WBC 3.0 (*) 3.9 - 10.3 10e3/uL   NEUT# 0.0 (*) 1.5 - 6.5 10e3/uL   HGB 10.2 (*) 11.6 - 15.9 g/dL   HCT 17.0 (*) 15.2 - 66.7 %   Platelets 247  145 - 400 10e3/uL   MCV 75.5 (*) 79.5 - 101.0 fL   MCH 24.0 (*) 25.1 - 34.0 pg   MCHC 31.8  31.5 - 36.0 g/dL   RBC 3.38  8.32 - 6.23 10e6/uL   RDW 22.4 (*) 11.2 - 14.5 %   lymph# 1.5  0.9 - 3.3 10e3/uL   MONO# 1.4 (*) 0.1 - 0.9 10e3/uL   Eosinophils Absolute 0.0  0.0 - 0.5 10e3/uL   Basophils Absolute 0.0  0.0 - 0.1 10e3/uL   NEUT% 0.6 (*) 38.4 - 76.8 %   LYMPH% 51.0 (*) 14.0 - 49.7 %   MONO% 47.0 (*) 0.0 - 14.0 %   EOS% 1.4  0.0 - 7.0 %   BASO% 0.0  0.0 - 2.0 %   Retic % 2.49 (*) 0.70 - 2.10 %   Retic Ct Abs 105.58 (*) 33.70 - 90.70 10e3/uL   Immature Retic Fract 10.90 (*) 1.60 - 10.00 %    Lab Results  Component Value Date   WBC 3.0* 06/30/2013   HGB 10.2* 06/30/2013   HCT 32.0* 06/30/2013   MCV 75.5* 06/30/2013   PLT 247 06/30/2013   ASSESSMENT & PLAN:  #1 severe leukopenia #2 anemia #3 abnormal lymphadenopathy, resolving Her most recent bone marrow result is still negative for infection and lymphoma/leukemia 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 She has good response with recovery of her white blood cell count although she still is severely neutropenic. I would continue on prednisone 60 mg daily  and I'll see her back in 2 weeks for further assessment. Her anemia is improving. #4 recent neutropenic fever, resolved I recommend observation. 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 She'll followup with infectious disease team as an outpatient. #6 oral thrush, resolved  All questions were answered. The patient knows to call the clinic with any problems, questions or concerns. No barriers to learning was detected.  I spent 25 minutes counseling the patient face to face. The total time spent in the appointment was  30 minutes and more than 50% was on counseling.     Heath Lark, MD 06/30/2013 8:50 AM

## 2013-06-30 NOTE — Telephone Encounter (Signed)
lvm for pt regarding to April appt....[facility] to add MD visitware

## 2013-07-03 ENCOUNTER — Encounter: Payer: Self-pay | Admitting: Internal Medicine

## 2013-07-03 ENCOUNTER — Encounter (HOSPITAL_COMMUNITY): Payer: Self-pay | Admitting: Emergency Medicine

## 2013-07-03 ENCOUNTER — Ambulatory Visit: Payer: No Typology Code available for payment source | Attending: Internal Medicine | Admitting: Internal Medicine

## 2013-07-03 ENCOUNTER — Inpatient Hospital Stay (HOSPITAL_COMMUNITY)
Admission: EM | Admit: 2013-07-03 | Discharge: 2013-07-06 | DRG: 809 | Disposition: A | Discharge status: Home / Self Care | Payer: No Typology Code available for payment source | Attending: Internal Medicine | Admitting: Internal Medicine

## 2013-07-03 ENCOUNTER — Emergency Department (HOSPITAL_COMMUNITY): Payer: No Typology Code available for payment source

## 2013-07-03 VITALS — BP 104/74 | HR 107 | Temp 100.4°F | Resp 16 | Ht 66.0 in | Wt 179.0 lb

## 2013-07-03 DIAGNOSIS — F1410 Cocaine abuse, uncomplicated: Secondary | ICD-10-CM | POA: Diagnosis present

## 2013-07-03 DIAGNOSIS — I272 Pulmonary hypertension, unspecified: Secondary | ICD-10-CM

## 2013-07-03 DIAGNOSIS — R5081 Fever presenting with conditions classified elsewhere: Secondary | ICD-10-CM | POA: Diagnosis present

## 2013-07-03 DIAGNOSIS — D708 Other neutropenia: Principal | ICD-10-CM | POA: Diagnosis present

## 2013-07-03 DIAGNOSIS — R509 Fever, unspecified: Secondary | ICD-10-CM | POA: Insufficient documentation

## 2013-07-03 DIAGNOSIS — I509 Heart failure, unspecified: Secondary | ICD-10-CM | POA: Diagnosis present

## 2013-07-03 DIAGNOSIS — I10 Essential (primary) hypertension: Secondary | ICD-10-CM | POA: Diagnosis present

## 2013-07-03 DIAGNOSIS — I2789 Other specified pulmonary heart diseases: Secondary | ICD-10-CM

## 2013-07-03 DIAGNOSIS — F14988 Cocaine use, unspecified with other cocaine-induced disorder: Secondary | ICD-10-CM

## 2013-07-03 DIAGNOSIS — D72819 Decreased white blood cell count, unspecified: Secondary | ICD-10-CM

## 2013-07-03 DIAGNOSIS — I999 Unspecified disorder of circulatory system: Secondary | ICD-10-CM | POA: Diagnosis present

## 2013-07-03 DIAGNOSIS — I5032 Chronic diastolic (congestive) heart failure: Secondary | ICD-10-CM | POA: Diagnosis present

## 2013-07-03 DIAGNOSIS — M310 Hypersensitivity angiitis: Secondary | ICD-10-CM

## 2013-07-03 DIAGNOSIS — D638 Anemia in other chronic diseases classified elsewhere: Secondary | ICD-10-CM | POA: Diagnosis present

## 2013-07-03 DIAGNOSIS — F1411 Cocaine abuse, in remission: Secondary | ICD-10-CM | POA: Diagnosis present

## 2013-07-03 DIAGNOSIS — D709 Neutropenia, unspecified: Secondary | ICD-10-CM | POA: Diagnosis present

## 2013-07-03 DIAGNOSIS — D539 Nutritional anemia, unspecified: Secondary | ICD-10-CM | POA: Diagnosis present

## 2013-07-03 DIAGNOSIS — I776 Arteritis, unspecified: Secondary | ICD-10-CM

## 2013-07-03 DIAGNOSIS — F3289 Other specified depressive episodes: Secondary | ICD-10-CM | POA: Diagnosis present

## 2013-07-03 DIAGNOSIS — I2510 Atherosclerotic heart disease of native coronary artery without angina pectoris: Secondary | ICD-10-CM | POA: Diagnosis present

## 2013-07-03 DIAGNOSIS — F339 Major depressive disorder, recurrent, unspecified: Secondary | ICD-10-CM

## 2013-07-03 DIAGNOSIS — A539 Syphilis, unspecified: Secondary | ICD-10-CM

## 2013-07-03 DIAGNOSIS — F329 Major depressive disorder, single episode, unspecified: Secondary | ICD-10-CM | POA: Diagnosis present

## 2013-07-03 DIAGNOSIS — F1910 Other psychoactive substance abuse, uncomplicated: Secondary | ICD-10-CM | POA: Diagnosis present

## 2013-07-03 DIAGNOSIS — I5033 Acute on chronic diastolic (congestive) heart failure: Secondary | ICD-10-CM | POA: Diagnosis present

## 2013-07-03 DIAGNOSIS — T50995A Adverse effect of other drugs, medicaments and biological substances, initial encounter: Secondary | ICD-10-CM | POA: Diagnosis present

## 2013-07-03 DIAGNOSIS — D61818 Other pancytopenia: Secondary | ICD-10-CM | POA: Diagnosis present

## 2013-07-03 LAB — CBC WITH DIFFERENTIAL/PLATELET
BASOS ABS: 0 10*3/uL (ref 0.0–0.1)
Basophils Relative: 2 % — ABNORMAL HIGH (ref 0–1)
EOS ABS: 0 10*3/uL (ref 0.0–0.7)
Eosinophils Relative: 1 % (ref 0–5)
HEMATOCRIT: 27.3 % — AB (ref 36.0–46.0)
Hemoglobin: 8.6 g/dL — ABNORMAL LOW (ref 12.0–15.0)
LYMPHS ABS: 0.8 10*3/uL (ref 0.7–4.0)
Lymphocytes Relative: 60 % — ABNORMAL HIGH (ref 12–46)
MCH: 24.4 pg — ABNORMAL LOW (ref 26.0–34.0)
MCHC: 31.5 g/dL (ref 30.0–36.0)
MCV: 77.3 fL — AB (ref 78.0–100.0)
Monocytes Absolute: 0.4 10*3/uL (ref 0.1–1.0)
Monocytes Relative: 33 % — ABNORMAL HIGH (ref 3–12)
NEUTROS ABS: 0.1 10*3/uL — AB (ref 1.7–7.7)
Neutrophils Relative %: 4 % — ABNORMAL LOW (ref 43–77)
PLATELETS: 200 10*3/uL (ref 150–400)
RBC: 3.53 MIL/uL — ABNORMAL LOW (ref 3.87–5.11)
RDW: 20.9 % — ABNORMAL HIGH (ref 11.5–15.5)
WBC: 1.3 10*3/uL — AB (ref 4.0–10.5)

## 2013-07-03 LAB — URINALYSIS, ROUTINE W REFLEX MICROSCOPIC
BILIRUBIN URINE: NEGATIVE
Glucose, UA: NEGATIVE mg/dL
Ketones, ur: NEGATIVE mg/dL
LEUKOCYTES UA: NEGATIVE
NITRITE: NEGATIVE
Protein, ur: NEGATIVE mg/dL
SPECIFIC GRAVITY, URINE: 1.021 (ref 1.005–1.030)
UROBILINOGEN UA: 0.2 mg/dL (ref 0.0–1.0)
pH: 5.5 (ref 5.0–8.0)

## 2013-07-03 LAB — COMPREHENSIVE METABOLIC PANEL
ALBUMIN: 2.6 g/dL — AB (ref 3.5–5.2)
ALT: 32 U/L (ref 0–35)
AST: 46 U/L — ABNORMAL HIGH (ref 0–37)
Alkaline Phosphatase: 98 U/L (ref 39–117)
BUN: 10 mg/dL (ref 6–23)
CO2: 17 mEq/L — ABNORMAL LOW (ref 19–32)
CREATININE: 0.64 mg/dL (ref 0.50–1.10)
Calcium: 8.9 mg/dL (ref 8.4–10.5)
Chloride: 105 mEq/L (ref 96–112)
GFR calc non Af Amer: >90 mL/min (ref >90)
GLUCOSE: 108 mg/dL — AB (ref 70–99)
Potassium: 4.2 mEq/L (ref 3.7–5.3)
Sodium: 137 mEq/L (ref 137–147)
Total Bilirubin: 0.4 mg/dL (ref 0.3–1.2)
Total Protein: 8.5 g/dL — ABNORMAL HIGH (ref 6.0–8.3)

## 2013-07-03 LAB — URINE MICROSCOPIC-ADD ON

## 2013-07-03 LAB — PREGNANCY, URINE: Preg Test, Ur: NEGATIVE

## 2013-07-03 LAB — I-STAT CG4 LACTIC ACID, ED: LACTIC ACID, VENOUS: 1.87 mmol/L (ref 0.5–2.2)

## 2013-07-03 MED ORDER — SODIUM CHLORIDE 0.9 % IV SOLN
1000.0000 mL | INTRAVENOUS | Status: DC
  Administered 2013-07-03: 1000 mL via INTRAVENOUS

## 2013-07-03 MED ORDER — VANCOMYCIN HCL IN DEXTROSE 1-5 GM/200ML-% IV SOLN
1000.0000 mg | Freq: Once | INTRAVENOUS | Status: AC
  Administered 2013-07-03: 1000 mg via INTRAVENOUS
  Filled 2013-07-03: qty 200

## 2013-07-03 MED ORDER — SODIUM CHLORIDE 0.9 % IV SOLN
1000.0000 mL | Freq: Once | INTRAVENOUS | Status: AC
  Administered 2013-07-03: 1000 mL via INTRAVENOUS

## 2013-07-03 MED ORDER — CEFEPIME HCL 2 G IJ SOLR
2.0000 g | Freq: Three times a day (TID) | INTRAMUSCULAR | Status: DC
  Administered 2013-07-03 – 2013-07-06 (×9): 2 g via INTRAVENOUS
  Filled 2013-07-03 (×10): qty 2

## 2013-07-03 NOTE — Patient Instructions (Signed)
Pt being transferred to [facility] via EMS for possible Septicemia Report given to charge nurse Lenna Sciara

## 2013-07-03 NOTE — Progress Notes (Signed)
Pt is here following up on her diabetes and HTN. Today pt's HR was 107 and her temp was 100.4

## 2013-07-03 NOTE — H&P (Signed)
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  Pt has been following up with both ID and hematology.  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  Pt states that since she started the prednisone she has felt the best she has ever felt.  Went for routine f/u with pcp today, and was noted to be mildly tachycardic and temp 100.4 and her wbc came back low, so was sent to ED for neutropenic fever.  Pt denies any fevers (she was unaware of fever at pcp office) no cough, no rashes,  No n/v/d.  No cp.  No sob.  No abd pain.  Pt has not smoked any crack cocaine in over 2 weeks.  She denies any ivda.  Review of Systems:  Positive and negative as per HPI otherwise all other systems are negative  Past Medical History: Past Medical History  Diagnosis Date  . Coronary artery disease     Lexiscan myoview (10/12) with significant ST depression upon Lexiscan injection but no ischemia or infarction on perfusion images. Left heart cath (10/12): 30% mid LAD, 30% ostial D1, 50% ostial D2.    [name] Hypertension   . Tricuspid regurgitation   . Iron deficiency     hx of  . Polysubstance abuse     Prior cocaine  . Tobacco abuse   . Leukocytoclastic vasculitis     Rash across lower body, occurred in 9/11, diagnosed by skin biopsy. ANA positive. Thought to be secondary to cocaine use.  . Pulmonary HTN     Echo (9/11) with EF 65%, mild LVH, mild AI, mild MR, moderate to possibly severe TR with PA systolic pressure 52 mmHg. Echo (10/12): severe LV hypertrophy, EF 60-65%, mild MR, mild AI, moderate to severe tricuspid regurgitation, PA systolic pressure 38 mmHg.  Echo 4/14: moderate LVH, EF 65%, normal wall motion, diastolic  dysfunction, mild AI, mild MR  . Asthma   . Shortness of breath     a. PFTs 5/14:  FEV1/FVC 99% predicted; FVC 60% predicted; DLCO mildly reduced, mild restriction, air trapping.;  b.  seen by pulmo (Dr. [name]) 5/14: mainly upper airway symptoms - ACE d/c'd (sx's better)  . Chronic diastolic CHF (congestive heart failure)   . Constipation   . Lymphadenopathy 03/21/2013  . Leukopenia 03/21/2013  . Unspecified deficiency anemia 03/29/2013  . Anginal pain     none in past year  . Anxiety   . Mental disorder   . Depression   . Stroke 2010ish  . Arthritis   . Pancytopenia   . Hyperlipemia   . Syphilis 04/25/2013  . Candidiasis 06/23/2013   Past Surgical History  Procedure Laterality Date  . Ankle surgery      right  . Cardiac catheterization    . I&d extremity  08/09/2011    Procedure: IRRIGATION AND DEBRIDEMENT EXTREMITY;  Surgeon: [name], MD;  Location: [facility];  Service: General;  Laterality: Left;  i & D Left axilla abscess  . Lymph node biopsy N/A 04/05/2013    Procedure: EXCISIONAL BIOPSY RIGHT SUBMANDIBULAR NODE, NASAL ENDOSCOPIC WITH BIOPSY NASAL PHARYNX;  Surgeon: [name], MD;  Location: [facility];  Service: ENT;  Laterality: N/A;  Medications: Prior to Admission medications   Medication Sig Start Date End Date Taking? Authorizing Provider  albuterol (PROVENTIL HFA;VENTOLIN HFA) 108 (90 BASE) MCG/ACT inhaler Inhale into the lungs every 6 (six) hours as needed for wheezing or shortness of breath.   Yes Historical Provider, MD  amLODipine (NORVASC) 10 MG tablet TAKE ONE TABLET DAILY   Yes [name], MD  aspirin 81 MG tablet Take 81 mg by mouth daily.   Yes Historical Provider, MD  buPROPion (WELLBUTRIN) 100 MG tablet Take 100 mg by mouth 2 (two) times daily. 03/22/13  Yes Historical Provider, MD  citalopram (CELEXA) 40 MG tablet Take 40 mg by mouth daily. 06/15/12  Yes Theodis Blaze, MD  cloNIDine (CATAPRES) 0.3 MG tablet Take 0.3 mg by mouth 2 (two) times daily.  07/13/12 07/13/13 Yes Scott T Weaver, PA-C  fluconazole (DIFLUCAN) 100 MG tablet Take 1 tablet (100 mg total) by mouth daily. 06/23/13  Yes Heath Lark, MD  furosemide (LASIX) 20 MG tablet Take 1 tablet (20 mg total) by mouth 2 (two) times daily. 07/13/12  Yes Liliane Shi, PA-C  hydrOXYzine (ATARAX/VISTARIL) 25 MG tablet Take 25 mg by mouth at bedtime as needed (sleep).   Yes Historical Provider, MD  loratadine (CLARITIN) 10 MG tablet Take 1 tablet (10 mg total) by mouth daily. 10/31/12  Yes Shanker Kristeen Mans, MD  Multiple Vitamin (MULTIVITAMIN WITH MINERALS) TABS Take 1 tablet by mouth daily. 11/18/11  Yes Milana Huntsman Readling, MD  omeprazole (PRILOSEC) 40 MG capsule Take 40 mg by mouth daily.   Yes Historical Provider, MD  polyethylene glycol powder (GLYCOLAX/MIRALAX) powder Take 17 g by mouth daily as needed (constipation).    Yes Historical Provider, MD  potassium chloride (K-DUR,KLOR-CON) 10 MEQ tablet Take 1 tablet (10 mEq total) by mouth 2 (two) times daily with a meal. 07/13/12  Yes Scott T Kathlen Mody, PA-C  predniSONE (DELTASONE) 20 MG tablet Take 3 tablets (60 mg total) by mouth daily with breakfast. 06/14/13  Yes Heath Lark, MD  traZODone (DESYREL) 100 MG tablet Take 100 mg by mouth at bedtime. 06/15/12  Yes Theodis Blaze, MD  albuterol (PROVENTIL HFA;VENTOLIN HFA) 108 (90 BASE) MCG/ACT inhaler Inhale 2 puffs into the lungs every 4 (four) hours as needed for wheezing or shortness of breath. 06/15/12 06/15/13  Theodis Blaze, MD    Allergies:   Allergies  Allergen Reactions  . Ciprofloxacin Itching  . Lipitor [Atorvastatin] Swelling    Social History:  reports that she quit smoking about 5 weeks ago. Her smoking use included Cigarettes. She has a 7.5 pack-year smoking history. She has never used smokeless tobacco. She reports that she uses illicit drugs ("Crack" cocaine) about once per week. She reports that she does not drink alcohol.  Family History: Family History  Problem Relation Age of Onset   . Coronary artery disease Mother   . Diabetes Mother   . Breast cancer Maternal Grandmother   . Diabetes Maternal Grandmother   . Diabetes Father   . Hypertension Father   . Coronary artery disease Father     Physical Exam: Filed Vitals:   07/03/13 2130 07/03/13 2208 07/03/13 2215 07/03/13 2300  BP: 111/81  97/64 108/70  Pulse: 84  78 77  Temp:  97.9 F (36.6 C)    TempSrc:  Rectal    Resp:      Height:      Weight:      SpO2: 100%  100% 100%   General  appearance: alert, cooperative and no distress Head: Normocephalic, without obvious abnormality, atraumatic Eyes: negative Nose: Nares normal. Septum midline. Mucosa normal. No drainage or sinus tenderness. Neck: no JVD and supple, symmetrical, trachea midline Lungs: clear to auscultation bilaterally Heart: regular rate and rhythm, S1, S2 normal, no murmur, click, rub or gallop Abdomen: soft, non-tender; bowel sounds normal; no masses,  no organomegaly Extremities: extremities normal, atraumatic, no cyanosis or edema Pulses: 2+ and symmetric Skin: Skin color, texture, turgor normal. No rashes or lesions Neurologic: Grossly normal    Labs on Admission:   Recent Labs  07/03/13 1837  NA 137  K 4.2  CL 105  CO2 17*  GLUCOSE 108*  BUN 10  CREATININE 0.64  CALCIUM 8.9    Recent Labs  07/03/13 1837  AST 46*  ALT 32  ALKPHOS 98  BILITOT 0.4  PROT 8.5*  ALBUMIN 2.6*    Recent Labs  07/03/13 1837  WBC 1.3*  NEUTROABS 0.1*  HGB 8.6*  HCT 27.3*  MCV 77.3*  PLT 200    Radiological Exams on Admission: Dg Chest Port 1 View  07/03/2013   CLINICAL DATA:  Fatigue  EXAM: PORTABLE CHEST - 1 VIEW  COMPARISON:  06/08/2013  FINDINGS: Cardiac shadow remains enlarged. The lungs are clear. No bony abnormality is seen. No gross soft tissue abnormality is noted.  IMPRESSION: No active disease.   Electronically Signed   By: Inez Catalina M.D.   On: 07/03/2013 19:17   Assessment/Plan  48 yo female with neutopenic  fever  Principal Problem:   Neutropenic fever-  Blood and urine cx pending.  Place on iv vanc and cefepime.  Will need to call ID and hem in am for further w/u and recommendations.   Cont steroids she has been on at dose of 60mg  po daily until evaluated by heme/onc.  Vss.  Admit to med surg.  Active Problems:   Chronic diastolic heart failure  Soft bp.  Hold diuretics overnight.   Cocaine abuse   Polysubstance abuse   Unspecified deficiency anemia    Emrie Gayle A Lashawn Orrego 07/03/2013, 11:43 PM

## 2013-07-03 NOTE — ED Provider Notes (Signed)
CSN: [id number]     Arrival date & time 07/03/13  1804 History   First MD Initiated Contact with Patient 07/03/13 1815     Chief Complaint  Patient presents with  . Fatigue     (Consider location/radiation/quality/duration/timing/severity/associated sxs/prior Treatment) HPI Comments: Patient is a 48 year old female past medical history significant for pancytopenia of unknown origin, leukopenia, CAD, HTN, hx of polysubstance abuse, tricuspid regurgitation, asthma, CHF, stroke, depression, arthritis, syphilis presenting to the ED from her PCP for being neutropenic at follow up appointment. She was noted to have a fever and be tachycardiac at her PCP office and was sent over for further evaluation. Patient denies any SOB, sore throat, CP, abdominal pain, nausea, vomiting, diarrhea, constipation, BRBPR, melena.    Past Medical History  Diagnosis Date  . Coronary artery disease     Lexiscan myoview (10/12) with significant ST depression upon Lexiscan injection but no ischemia or infarction on perfusion images. Left heart cath (10/12): 30% mid LAD, 30% ostial D1, 50% ostial D2.    [name] Hypertension   . Tricuspid regurgitation   . Iron deficiency     hx of  . Polysubstance abuse     Prior cocaine  . Tobacco abuse   . Leukocytoclastic vasculitis     Rash across lower body, occurred in 9/11, diagnosed by skin biopsy. ANA positive. Thought to be secondary to cocaine use.  . Pulmonary HTN     Echo (9/11) with EF 65%, mild LVH, mild AI, mild MR, moderate to possibly severe TR with PA systolic pressure 52 mmHg. Echo (10/12): severe LV hypertrophy, EF 60-65%, mild MR, mild AI, moderate to severe tricuspid regurgitation, PA systolic pressure 38 mmHg.  Echo 4/14: moderate LVH, EF 65%, normal wall motion, diastolic dysfunction, mild AI, mild MR  . Asthma   . Shortness of breath     a. PFTs 5/14:  FEV1/FVC 99% predicted; FVC 60% predicted; DLCO mildly reduced, mild restriction, air trapping.;  b.  seen by  pulmo (Dr. [name]) 5/14: mainly upper airway symptoms - ACE d/c'd (sx's better)  . Chronic diastolic CHF (congestive heart failure)   . Constipation   . Lymphadenopathy 03/21/2013  . Leukopenia 03/21/2013  . Unspecified deficiency anemia 03/29/2013  . Anginal pain     none in past year  . Anxiety   . Mental disorder   . Depression   . Stroke 2010ish  . Arthritis   . Pancytopenia   . Hyperlipemia   . Syphilis 04/25/2013  . Candidiasis 06/23/2013   Past Surgical History  Procedure Laterality Date  . Ankle surgery      right  . Cardiac catheterization    . I&d extremity  08/09/2011    Procedure: IRRIGATION AND DEBRIDEMENT EXTREMITY;  Surgeon: [name], MD;  Location: [facility];  Service: General;  Laterality: Left;  i & D Left axilla abscess  . Lymph node biopsy N/A 04/05/2013    Procedure: EXCISIONAL BIOPSY RIGHT SUBMANDIBULAR NODE, NASAL ENDOSCOPIC WITH BIOPSY NASAL PHARYNX;  Surgeon: [name], MD;  Location: [facility] OR;  Service: ENT;  Laterality: N/A;   Family History  Problem Relation Age of Onset  . Coronary artery disease Mother   . Diabetes Mother   . Breast cancer Maternal Grandmother   . Diabetes Maternal Grandmother   . Diabetes Father   . Hypertension Father   . Coronary artery disease Father    History  Substance Use Topics  . Smoking status: Former Smoker -- 0.25 packs/day for  30 years    Types: Cigarettes    Quit date: 05/23/2013  . Smokeless tobacco: Never Used     Comment: 5 cigs --- uses E-cig  . Alcohol Use: No     Comment: pint of wine 2-3 times a week--ocassional   OB History   Grav Para Term Preterm Abortions TAB SAB Ect Mult Living   4 2 2  2 2    2      Review of Systems  Allergic/Immunologic: Positive for immunocompromised state.  All other systems reviewed and are negative.     Allergies  Ciprofloxacin and Lipitor  Home Medications   Current Outpatient Rx  Name  Route  Sig  Dispense  Refill  . albuterol (PROVENTIL HFA;VENTOLIN HFA)  108 (90 BASE) MCG/ACT inhaler   Inhalation   Inhale into the lungs every 6 (six) hours as needed for wheezing or shortness of breath.         [name] amLODipine (NORVASC) 10 MG tablet      TAKE ONE TABLET DAILY   30 tablet   1     No refills available   . aspirin 81 MG tablet   Oral   Take 81 mg by mouth daily.         [name] buPROPion (WELLBUTRIN) 100 MG tablet   Oral   Take 100 mg by mouth 2 (two) times daily.         . citalopram (CELEXA) 40 MG tablet   Oral   Take 40 mg by mouth daily.         . cloNIDine (CATAPRES) 0.3 MG tablet   Oral   Take 0.3 mg by mouth 2 (two) times daily.         . fluconazole (DIFLUCAN) 100 MG tablet   Oral   Take 1 tablet (100 mg total) by mouth daily.   7 tablet   0   . furosemide (LASIX) 20 MG tablet   Oral   Take 1 tablet (20 mg total) by mouth 2 (two) times daily.   60 tablet   11   . hydrOXYzine (ATARAX/VISTARIL) 25 MG tablet   Oral   Take 25 mg by mouth at bedtime as needed (sleep).         . loratadine (CLARITIN) 10 MG tablet   Oral   Take 1 tablet (10 mg total) by mouth daily.   30 tablet   3   . Multiple Vitamin (MULTIVITAMIN WITH MINERALS) TABS   Oral   Take 1 tablet by mouth daily.         [name] omeprazole (PRILOSEC) 40 MG capsule   Oral   Take 40 mg by mouth daily.         . polyethylene glycol powder (GLYCOLAX/MIRALAX) powder   Oral   Take 17 g by mouth daily as needed (constipation).          . potassium chloride (K-DUR,KLOR-CON) 10 MEQ tablet   Oral   Take 1 tablet (10 mEq total) by mouth 2 (two) times daily with a meal.   60 tablet   11   . predniSONE (DELTASONE) 20 MG tablet   Oral   Take 3 tablets (60 mg total) by mouth daily with breakfast.   90 tablet   2   . traZODone (DESYREL) 100 MG tablet   Oral   Take 100 mg by mouth at bedtime.         [name] EXPIRED: albuterol (PROVENTIL HFA;VENTOLIN HFA) 108 (90 BASE)  MCG/ACT inhaler   Inhalation   Inhale 2 puffs into the lungs every 4 (four)  hours as needed for wheezing or shortness of breath.          BP 108/70  Pulse 77  Temp(Src) 97.9 F (36.6 C) (Rectal)  Resp 22  Ht 5\' 6"  (1.676 m)  Wt 179 lb (81.194 kg)  BMI 28.91 kg/m2  SpO2 100%  LMP 04/24/2013 Physical Exam  Nursing note and vitals reviewed. Constitutional: She is oriented to person, place, and time. She appears well-developed and well-nourished. No distress.  HENT:  Head: Normocephalic and atraumatic.  Right Ear: External ear normal.  Left Ear: External ear normal.  Nose: Nose normal.  Mouth/Throat: Oropharynx is clear and moist.  Eyes: Conjunctivae and EOM are normal. Pupils are equal, round, and reactive to light.  Neck: Normal range of motion. Neck supple.  Cardiovascular: Normal rate, regular rhythm, normal heart sounds and intact distal pulses.   Pulmonary/Chest: Effort normal and breath sounds normal. No respiratory distress. She exhibits no tenderness.  Abdominal: Soft. Bowel sounds are normal. There is no tenderness.  Musculoskeletal: Normal range of motion. She exhibits no edema and no tenderness.  Lymphadenopathy:    She has no cervical adenopathy.  Neurological: She is alert and oriented to person, place, and time.  Skin: Skin is warm and dry. She is not diaphoretic.  Psychiatric: She has a normal mood and affect.    ED Course  Procedures (including critical care time) Medications  0.9 %  sodium chloride infusion (0 mLs Intravenous Stopped 07/03/13 2028)    Followed by  0.9 %  sodium chloride infusion (1,000 mLs Intravenous New Bag/Given 07/03/13 2028)  ceFEPIme (MAXIPIME) 2 g in dextrose 5 % 50 mL IVPB (2 g Intravenous New Bag/Given 07/03/13 1944)  vancomycin (VANCOCIN) IVPB 1000 mg/200 mL premix (0 mg Intravenous Stopped 07/03/13 2129)    Labs Review Labs Reviewed  CBC WITH DIFFERENTIAL - Abnormal; Notable for the following:    WBC 1.3 (*)    RBC 3.53 (*)    Hemoglobin 8.6 (*)    HCT 27.3 (*)    MCV 77.3 (*)    MCH 24.4 (*)     RDW 20.9 (*)    Neutrophils Relative % 4 (*)    Lymphocytes Relative 60 (*)    Monocytes Relative 33 (*)    Basophils Relative 2 (*)    Neutro Abs 0.1 (*)    All other components within normal limits  COMPREHENSIVE METABOLIC PANEL - Abnormal; Notable for the following:    CO2 17 (*)    Glucose, Bld 108 (*)    Total Protein 8.5 (*)    Albumin 2.6 (*)    AST 46 (*)    All other components within normal limits  URINALYSIS, ROUTINE W REFLEX MICROSCOPIC - Abnormal; Notable for the following:    Hgb urine dipstick SMALL (*)    All other components within normal limits  CULTURE, BLOOD (ROUTINE X 2)  CULTURE, BLOOD (ROUTINE X 2)  URINE CULTURE  PREGNANCY, URINE  URINE MICROSCOPIC-ADD ON  RETICULOCYTES  URINE RAPID DRUG SCREEN (HOSP PERFORMED)  I-STAT CG4 LACTIC ACID, ED   Imaging Review Dg Chest Port 1 View  07/03/2013   CLINICAL DATA:  Fatigue  EXAM: PORTABLE CHEST - 1 VIEW  COMPARISON:  06/08/2013  FINDINGS: Cardiac shadow remains enlarged. The lungs are clear. No bony abnormality is seen. No gross soft tissue abnormality is noted.  IMPRESSION: No active disease.  Electronically Signed   By: Inez Catalina M.D.   On: 07/03/2013 19:17     EKG Interpretation None      MDM   Final diagnoses:  Neutropenic fever    Filed Vitals:   07/03/13 2300  BP: 108/70  Pulse: 77  Temp:   Resp:    Patient is a 48 yo F PMHx significant for pancytopenia of unknown origin sent over from her PCP office for further evaluation of fever and leukopenia. Patient was noted to be febrile and tachycardic while at PCP office. No fevers in ED, vitals otherwise stable. WBC noted at 1.3 decreased from 3 days ago. Patient is neutropenic. Chest x-ray unremarkable. Urine unremarkable. Anion gap of 15. Bicarbonate was noted to be low. Bilirubin within normal limits, reticulocyte count sent. BCx2 obtained. Urine culture sent. Vancomycin and cefepime started prophylactically for neutropenic fever. Patient will be  admitted for further evaluation and management. Patient d/w with Dr. Wyvonnia Dusky, agrees with plan.       Nichols Hills, PA-C 07/04/13 0006

## 2013-07-03 NOTE — Progress Notes (Signed)
ANTIBIOTIC CONSULT NOTE - INITIAL  Pharmacy Consult for cefepime Indication: febrile neutropenia  Allergies  Allergen Reactions  . Ciprofloxacin Itching  . Lipitor [Atorvastatin] Swelling    Patient Measurements: Height: 5\' 6"  (167.6 cm) Weight: 179 lb (81.194 kg) IBW/kg (Calculated) : 59.3 Adjusted Body Weight:   Vital Signs: Temp: 99 F (37.2 C) (04/13 1949) Temp src: Oral (04/13 1949) BP: 107/71 mmHg (04/13 1949) Pulse Rate: 80 (04/13 1949) Intake/Output from previous day:   Intake/Output from this shift:    Labs:  Recent Labs  07/03/13 1837  WBC 1.3*  HGB 8.6*  PLT 200  CREATININE 0.64   Estimated Creatinine Clearance: 90.4 ml/min (by C-G formula based on Cr of 0.64). No results found for this basename: VANCOTROUGH, Corlis Leak, VANCORANDOM, GENTTROUGH, GENTPEAK, GENTRANDOM, TOBRATROUGH, TOBRAPEAK, TOBRARND, AMIKACINPEAK, AMIKACINTROU, AMIKACIN,  in the last 72 hours   Microbiology: Recent Results (from the past 720 hour(s))  TISSUE CULTURE     Status: None   Collection Time    06/07/13 12:38 PM      Result Value Ref Range Status   Specimen Description TISSUE BONE MARROW   Final   Special Requests NONE   Final   Gram Stain     Final   Value: NO WBC SEEN     NO SQUAMOUS EPITHELIAL CELLS SEEN     NO ORGANISMS SEEN     Performed at [facility]   Culture     Final   Value: NO GROWTH 3 DAYS     Performed at [facility]   Report Status 06/11/2013 FINAL   Final  FUNGUS CULTURE W SMEAR     Status: None   Collection Time    06/07/13 12:38 PM      Result Value Ref Range Status   Specimen Description TISSUE BONE MARROW   Final   Special Requests NONE   Final   Fungal Smear     Final   Value: NO YEAST OR FUNGAL ELEMENTS SEEN     Performed at [facility]   Culture     Final   Value: CULTURE IN PROGRESS FOR FOUR WEEKS     Performed at [facility]   Report Status PENDING   Incomplete  AFB CULTURE WITH SMEAR     Status: None    Collection Time    06/07/13 12:38 PM      Result Value Ref Range Status   Specimen Description TISSUE BONE MARROW   Final   Special Requests NONE   Final   ACID FAST SMEAR     Final   Value: NO ACID FAST BACILLI SEEN     Performed at [facility]   Culture     Final   Value: CULTURE WILL BE EXAMINED FOR 6 WEEKS BEFORE ISSUING A FINAL REPORT     Performed at [facility]   Report Status PENDING   Incomplete    Medical History: Past Medical History  Diagnosis Date  . Coronary artery disease     Lexiscan myoview (10/12) with significant ST depression upon Lexiscan injection but no ischemia or infarction on perfusion images. Left heart cath (10/12): 30% mid LAD, 30% ostial D1, 50% ostial D2.    [name] Hypertension   . Tricuspid regurgitation   . Iron deficiency     hx of  . Polysubstance abuse     Prior cocaine  . Tobacco abuse   . Leukocytoclastic vasculitis     Rash across  lower body, occurred in 9/11, diagnosed by skin biopsy. ANA positive. Thought to be secondary to cocaine use.  . Pulmonary HTN     Echo (9/11) with EF 65%, mild LVH, mild AI, mild MR, moderate to possibly severe TR with PA systolic pressure 52 mmHg. Echo (10/12): severe LV hypertrophy, EF 60-65%, mild MR, mild AI, moderate to severe tricuspid regurgitation, PA systolic pressure 38 mmHg.  Echo 4/14: moderate LVH, EF 65%, normal wall motion, diastolic dysfunction, mild AI, mild MR  . Asthma   . Shortness of breath     a. PFTs 5/14:  FEV1/FVC 99% predicted; FVC 60% predicted; DLCO mildly reduced, mild restriction, air trapping.;  b.  seen by pulmo (Dr. [name]) 5/14: mainly upper airway symptoms - ACE d/c'd (sx's better)  . Chronic diastolic CHF (congestive heart failure)   . Constipation   . Lymphadenopathy 03/21/2013  . Leukopenia 03/21/2013  . Unspecified deficiency anemia 03/29/2013  . Anginal pain     none in past year  . Anxiety   . Mental disorder   . Depression   . Stroke 2010ish  .  Arthritis   . Pancytopenia   . Hyperlipemia   . Syphilis 04/25/2013  . Candidiasis 06/23/2013    Medications:  Anti-infectives   Start     Dose/Rate Route Frequency Ordered Stop   07/03/13 2000  ceFEPIme (MAXIPIME) 2 g in dextrose 5 % 50 mL IVPB     2 g 100 mL/hr over 30 Minutes Intravenous Every 8 hours 07/03/13 1936     07/03/13 1930  vancomycin (VANCOCIN) IVPB 1000 mg/200 mL premix     1,000 mg 200 mL/hr over 60 Minutes Intravenous  Once 07/03/13 1917       Assessment: 74 yof presented to the ED from [place] complaining of fatigue. To start empiric cefepime for febrile neutropenia. Tmax is 100.4, WBC is low at 1.3. Renal fxn is good. Also ordered 1x dose of vancomycin.  Cefepime 4/13>> Vanc x 1 4/13  Goal of Therapy:  Eradication of infection  Plan:  1. Cefepime 2gm IV Q8H 2. F/u renal fxn, C&S, clinical status 3. F/u continuation of vancomycin   Rande Lawman Betty Brooks 07/03/2013,8:26 PM

## 2013-07-03 NOTE — ED Notes (Addendum)
Pt to ED via [facility] from [facility] c/o fatigue.  Pt was seen at [facility] for routine visit; lab work shows neutrophils on CBC panel is 0.  Pt reports initial symptoms of swollen lymph nodes and abnormal white count began in December 2014. Pt sts "I am seeing 4 different doctors and they still don't know what is wrong with me.  Something about my white blood cell count is 0. I'm not really sure why I am here. Nothing is different than when I went in December."

## 2013-07-04 DIAGNOSIS — I10 Essential (primary) hypertension: Secondary | ICD-10-CM

## 2013-07-04 DIAGNOSIS — D72819 Decreased white blood cell count, unspecified: Secondary | ICD-10-CM

## 2013-07-04 DIAGNOSIS — R509 Fever, unspecified: Secondary | ICD-10-CM

## 2013-07-04 DIAGNOSIS — F339 Major depressive disorder, recurrent, unspecified: Secondary | ICD-10-CM

## 2013-07-04 DIAGNOSIS — R599 Enlarged lymph nodes, unspecified: Secondary | ICD-10-CM

## 2013-07-04 DIAGNOSIS — D649 Anemia, unspecified: Secondary | ICD-10-CM

## 2013-07-04 DIAGNOSIS — I2510 Atherosclerotic heart disease of native coronary artery without angina pectoris: Secondary | ICD-10-CM

## 2013-07-04 DIAGNOSIS — D72821 Monocytosis (symptomatic): Secondary | ICD-10-CM

## 2013-07-04 DIAGNOSIS — D61818 Other pancytopenia: Secondary | ICD-10-CM

## 2013-07-04 LAB — URINE CULTURE
COLONY COUNT: NO GROWTH
Culture: NO GROWTH

## 2013-07-04 LAB — DIFFERENTIAL
Basophils Absolute: 0 10*3/uL (ref 0.0–0.1)
Basophils Relative: 0 % (ref 0–1)
EOS PCT: 0 % (ref 0–5)
Eosinophils Absolute: 0 10*3/uL (ref 0.0–0.7)
LYMPHS ABS: 1.3 10*3/uL (ref 0.7–4.0)
Lymphocytes Relative: 64 % — ABNORMAL HIGH (ref 12–46)
MONOS PCT: 35 % — AB (ref 3–12)
Monocytes Absolute: 0.7 10*3/uL (ref 0.1–1.0)
Neutro Abs: 0 10*3/uL — ABNORMAL LOW (ref 1.7–7.7)
Neutrophils Relative %: 1 % — ABNORMAL LOW (ref 43–77)

## 2013-07-04 LAB — CBC
HCT: 27.2 % — ABNORMAL LOW (ref 36.0–46.0)
Hemoglobin: 8.4 g/dL — ABNORMAL LOW (ref 12.0–15.0)
MCH: 23.7 pg — ABNORMAL LOW (ref 26.0–34.0)
MCHC: 30.9 g/dL (ref 30.0–36.0)
MCV: 76.6 fL — ABNORMAL LOW (ref 78.0–100.0)
Platelets: 181 K/uL (ref 150–400)
RBC: 3.55 MIL/uL — ABNORMAL LOW (ref 3.87–5.11)
RDW: 20.5 % — ABNORMAL HIGH (ref 11.5–15.5)
WBC: 2 K/uL — ABNORMAL LOW (ref 4.0–10.5)

## 2013-07-04 LAB — BASIC METABOLIC PANEL
BUN: 11 mg/dL (ref 6–23)
CHLORIDE: 104 meq/L (ref 96–112)
CO2: 19 mEq/L (ref 19–32)
CREATININE: 0.56 mg/dL (ref 0.50–1.10)
Calcium: 8.7 mg/dL (ref 8.4–10.5)
GFR calc Af Amer: >90 mL/min (ref >90)
GFR calc non Af Amer: >90 mL/min (ref >90)
Glucose, Bld: 197 mg/dL — ABNORMAL HIGH (ref 70–99)
Potassium: 4.1 mEq/L (ref 3.7–5.3)
Sodium: 136 mEq/L — ABNORMAL LOW (ref 137–147)

## 2013-07-04 LAB — RAPID URINE DRUG SCREEN, HOSP PERFORMED
Amphetamines: NOT DETECTED
BARBITURATES: NOT DETECTED
Benzodiazepines: NOT DETECTED
COCAINE: NOT DETECTED
Opiates: NOT DETECTED
TETRAHYDROCANNABINOL: NOT DETECTED

## 2013-07-04 LAB — PATHOLOGIST SMEAR REVIEW

## 2013-07-04 LAB — RETICULOCYTES
RBC.: 3.58 MIL/uL — ABNORMAL LOW (ref 3.87–5.11)
RETIC CT PCT: 2.5 % (ref 0.4–3.1)
Retic Count, Absolute: 89.5 10*3/uL (ref 19.0–186.0)

## 2013-07-04 MED ORDER — SODIUM CHLORIDE 0.9 % IV SOLN
250.0000 mL | INTRAVENOUS | Status: DC | PRN
Start: 2013-07-04 — End: 2013-07-06
  Administered 2013-07-04: 250 mL via INTRAVENOUS

## 2013-07-04 MED ORDER — VANCOMYCIN HCL 10 G IV SOLR
1250.0000 mg | Freq: Two times a day (BID) | INTRAVENOUS | Status: DC
  Filled 2013-07-04 (×2): qty 1250

## 2013-07-04 MED ORDER — SODIUM CHLORIDE 0.9 % IJ SOLN
3.0000 mL | Freq: Two times a day (BID) | INTRAMUSCULAR | Status: DC
  Administered 2013-07-05 – 2013-07-06 (×2): 3 mL via INTRAVENOUS

## 2013-07-04 MED ORDER — TRAZODONE HCL 100 MG PO TABS
100.0000 mg | ORAL_TABLET | Freq: Every day | ORAL | Status: DC
  Administered 2013-07-04 – 2013-07-05 (×3): 100 mg via ORAL
  Filled 2013-07-04 (×4): qty 1

## 2013-07-04 MED ORDER — CLONIDINE HCL 0.3 MG PO TABS
0.3000 mg | ORAL_TABLET | Freq: Two times a day (BID) | ORAL | Status: DC
  Administered 2013-07-04 – 2013-07-06 (×4): 0.3 mg via ORAL
  Filled 2013-07-04 (×7): qty 1

## 2013-07-04 MED ORDER — VANCOMYCIN HCL 10 G IV SOLR
1250.0000 mg | Freq: Two times a day (BID) | INTRAVENOUS | Status: DC
  Administered 2013-07-04 – 2013-07-06 (×5): 1250 mg via INTRAVENOUS
  Filled 2013-07-04 (×8): qty 1250

## 2013-07-04 MED ORDER — POLYETHYLENE GLYCOL 3350 17 G PO PACK
17.0000 g | PACK | Freq: Every day | ORAL | Status: DC | PRN
  Filled 2013-07-04: qty 1

## 2013-07-04 MED ORDER — HYDROXYZINE HCL 25 MG PO TABS: 25.0000 mg | ORAL_TABLET | Freq: Every evening | ORAL | Status: DC | PRN

## 2013-07-04 MED ORDER — ACETAMINOPHEN 650 MG RE SUPP: 650.0000 mg | Freq: Four times a day (QID) | RECTAL | Status: DC | PRN

## 2013-07-04 MED ORDER — FLUCONAZOLE 100 MG PO TABS
100.0000 mg | ORAL_TABLET | Freq: Every day | ORAL | Status: DC
  Administered 2013-07-04 – 2013-07-06 (×3): 100 mg via ORAL
  Filled 2013-07-04 (×3): qty 1

## 2013-07-04 MED ORDER — ACETAMINOPHEN 325 MG PO TABS
650.0000 mg | ORAL_TABLET | Freq: Four times a day (QID) | ORAL | Status: DC | PRN
  Administered 2013-07-06: 650 mg via ORAL
  Filled 2013-07-04: qty 2

## 2013-07-04 MED ORDER — ALBUTEROL SULFATE (2.5 MG/3ML) 0.083% IN NEBU: 2.5000 mg | INHALATION_SOLUTION | RESPIRATORY_TRACT | Status: DC | PRN

## 2013-07-04 MED ORDER — LORATADINE 10 MG PO TABS
10.0000 mg | ORAL_TABLET | Freq: Every day | ORAL | Status: DC
  Administered 2013-07-04 – 2013-07-06 (×3): 10 mg via ORAL
  Filled 2013-07-04 (×3): qty 1

## 2013-07-04 MED ORDER — TBO-FILGRASTIM 480 MCG/0.8ML ~~LOC~~ SOSY
480.0000 ug | PREFILLED_SYRINGE | Freq: Every day | SUBCUTANEOUS | Status: DC
  Filled 2013-07-04: qty 0.8

## 2013-07-04 MED ORDER — ADULT MULTIVITAMIN W/MINERALS CH
1.0000 | ORAL_TABLET | Freq: Every day | ORAL | Status: DC
  Administered 2013-07-04 – 2013-07-06 (×3): 1 via ORAL
  Filled 2013-07-04 (×3): qty 1

## 2013-07-04 MED ORDER — CITALOPRAM HYDROBROMIDE 40 MG PO TABS
40.0000 mg | ORAL_TABLET | Freq: Every day | ORAL | Status: DC
  Administered 2013-07-04 – 2013-07-06 (×3): 40 mg via ORAL
  Filled 2013-07-04 (×3): qty 1

## 2013-07-04 MED ORDER — PREDNISONE 50 MG PO TABS
60.0000 mg | ORAL_TABLET | Freq: Every day | ORAL | Status: DC
  Administered 2013-07-04 – 2013-07-06 (×3): 60 mg via ORAL
  Filled 2013-07-04 (×4): qty 1

## 2013-07-04 MED ORDER — BUPROPION HCL 100 MG PO TABS
100.0000 mg | ORAL_TABLET | Freq: Two times a day (BID) | ORAL | Status: DC
  Administered 2013-07-04 – 2013-07-06 (×6): 100 mg via ORAL
  Filled 2013-07-04 (×7): qty 1

## 2013-07-04 MED ORDER — SODIUM CHLORIDE 0.9 % IJ SOLN: 3.0000 mL | INTRAMUSCULAR | Status: DC | PRN

## 2013-07-04 MED ORDER — FILGRASTIM 480 MCG/1.6ML IJ SOLN
480.0000 ug | Freq: Every day | INTRAMUSCULAR | Status: DC
  Administered 2013-07-04 – 2013-07-05 (×2): 480 ug via SUBCUTANEOUS
  Filled 2013-07-04 (×3): qty 1.6

## 2013-07-04 NOTE — Progress Notes (Signed)
UR complete.  Oralia Criger RN, MSN 

## 2013-07-04 NOTE — Progress Notes (Signed)
Pt arrived via EMS stretcher by Cline Cools nurse tech. Pt ambulated to bed and bathroom fine as stand-by with IV pole. Call bell placed. Oriented pt to room and answered questions. Pt is on neurtopenic precautions. Will continue to monitor.

## 2013-07-04 NOTE — Progress Notes (Signed)
Agree with dietetic intern note. Ac Colan Barnett RD, LDN Inpatient Clinical Dietitian Pager: [phone] After Hours Pager: [phone]  

## 2013-07-04 NOTE — Progress Notes (Signed)
[facility]                                                                              Patient Demographics  Morgan Bean, is a 48 y.o. female, DOB - 01/23/1966, VFI:[id number]  Admit date - 07/03/2013   Admitting Physician Phillips Grout, MD  Outpatient Primary MD for the patient is [name], MD  LOS - 1   Chief Complaint  Patient presents with  . Fatigue      Interim history 48 year old female with history of polysubstance abuse including cocaine, coronary artery disease, diastolic CHF, recent hospitalization for neutropenic fever presents again from her primary care physician's office for fever with neutropenia. Patient has been followed by infectious disease as well as hematology as an outpatient. She was recently admitted and discharged approximately 3 weeks ago and was started on prednisone by hematology. Patient supposedly also had a history of syphilis, was treated on doxycycline, however pancytopenia worsened. Currently blood cultures are pending, urinary analysis as well as chest x-ray is also negative. Infectious disease as well as hematology consulted.  Assessment & Plan   Recurrent neutropenic fever -White count is improving -Hematology and ID consulted and following -Continue empiric antibiotics with vancomycin, cefepime as well as Diflucan -Chest x-ray negative, urinalysis negative, blood cultures currently pending -Continue prednisone -Patient has had extensive workup in the past including bone marrow biopsies and lymph node biopsy  Depression -Continue Celexa and Wellbutrin  Hypertension -Stable, Continue clonidine  Chronic diastolic heart failure -Currently none overload -Diuretics held due to soft blood pressure -Last echocardiogram in April 2004 changes of 65% with left ventricular diastolic dysfunction -Will monitoring daily weights, intake and output, and place patient on fluid restriction  Polysubstance abuse including cocaine  -Tox  screen was negative   Pancytopenia, chronic -White blood count improving, hemoglobin and hematocrit currently stable, platelets stable  Code Status: Full  Family Communication: None at bedside.  Disposition Plan: Admitted  Time Spent in minutes   30 minutes  Procedures  None  Consults   Hematology/Oncology Infectious Disease  DVT Prophylaxis  SCDs  Lab Results  Component Value Date   PLT 181 07/04/2013    Medications  Scheduled Meds: . buPROPion  100 mg Oral BID  . ceFEPime (MAXIPIME) IV  2 g Intravenous Q8H  . citalopram  40 mg Oral Daily  . cloNIDine  0.3 mg Oral BID  . fluconazole  100 mg Oral Daily  . loratadine  10 mg Oral Daily  . multivitamin with minerals  1 tablet Oral Daily  . predniSONE  60 mg Oral Q breakfast  . sodium chloride  3 mL Intravenous Q12H  . traZODone  100 mg Oral QHS  . vancomycin  1,250 mg Intravenous Q12H   Continuous Infusions:  PRN Meds:.sodium chloride, acetaminophen, acetaminophen, albuterol, hydrOXYzine, polyethylene glycol, sodium chloride  Antibiotics    Anti-infectives   Start     Dose/Rate Route Frequency Ordered Stop   07/04/13 1000  fluconazole (DIFLUCAN) tablet 100 mg     100 mg Oral Daily 07/04/13 0052     07/04/13 0600  vancomycin (VANCOCIN) 1,250 mg in sodium chloride 0.9 % 250 mL IVPB  1,250 mg 166.7 mL/hr over 90 Minutes Intravenous Every 12 hours 07/04/13 0113     07/04/13 0115  vancomycin (VANCOCIN) 1,250 mg in sodium chloride 0.9 % 250 mL IVPB  Status:  Discontinued     1,250 mg 166.7 mL/hr over 90 Minutes Intravenous Every 12 hours 07/04/13 0112 07/04/13 0113   07/03/13 2000  ceFEPIme (MAXIPIME) 2 g in dextrose 5 % 50 mL IVPB     2 g 100 mL/hr over 30 Minutes Intravenous Every 8 hours 07/03/13 1936     07/03/13 1930  vancomycin (VANCOCIN) IVPB 1000 mg/200 mL premix     1,000 mg 200 mL/hr over 60 Minutes Intravenous  Once 07/03/13 1917 07/03/13 2129        Subjective:   Morgan Bean seen and  examined today.  Patient has no complaints this morning.  She was sent to the hospital from her PCP's office.   Objective:   Filed Vitals:   07/03/13 2215 07/03/13 2300 07/04/13 0100 07/04/13 0523  BP: 97/64 108/70 133/90 109/68  Pulse: 78 77 85 73  Temp:   97.5 F (36.4 C) 97.3 F (36.3 C)  TempSrc:   Oral Oral  Resp:   18 18  Height:   5\' 6"  (1.676 m)   Weight:   81.965 kg (180 lb 11.2 oz)   SpO2: 100% 100% 100% 100%    Wt Readings from Last 3 Encounters:  07/04/13 81.965 kg (180 lb 11.2 oz)  07/03/13 81.194 kg (179 lb)  06/30/13 79.107 kg (174 lb 6.4 oz)     Intake/Output Summary (Last 24 hours) at 07/04/13 9629 Last data filed at 07/04/13 0750  Gross per 24 hour  Intake    600 ml  Output      0 ml  Net    600 ml    Exam  General: Well developed, well nourished, NAD, appears stated age  HEENT: NCAT, PERRLA, EOMI, Anicteic Sclera, mucous membranes moist. poor dentition.   Neck: Supple, no JVD, no masses, no cervical lymphadenopathy   Cardiovascular: S1 S2 auscultated, no rubs, murmurs or gallops. Regular rate and rhythm.  Respiratory: Clear to auscultation bilaterally with equal chest rise  Abdomen: Soft, nontender, nondistended, + bowel sounds  Extremities: warm dry without cyanosis clubbing or edema  Neuro: AAOx3, cranial nerves grossly intact. Strength 5/5 in patient's upper and lower extremities bilaterally  Skin: Without rashes exudates or nodules  Psych: Normal affect and demeanor with intact judgement and insight  Data Review   Micro Results No results found for this or any previous visit (from the past 240 hour(s)).  Radiology Reports Ct Angio Chest Pe W/cm &/or Wo Cm  06/08/2013   CLINICAL DATA:  Chest pain  EXAM: CT ANGIOGRAPHY CHEST WITH CONTRAST  TECHNIQUE: Multidetector CT imaging of the chest was performed using the standard protocol during bolus administration of intravenous contrast. Multiplanar CT image reconstructions and MIPs were  obtained to evaluate the vascular anatomy.  CONTRAST:  124mL OMNIPAQUE IOHEXOL 350 MG/ML SOLN  COMPARISON:  Chest CT April 16, 2013 and chest radiograph June 08, 2013  FINDINGS: There is no demonstrable pulmonary embolus. There is no thoracic aortic aneurysm or dissection.  There is focal atelectatic change in the posterior left base. Mild scarring in both lung bases is also noted. There is no frank edema or consolidation.  There is no appreciable thoracic adenopathy. Pericardium is not thickened. There is a small hiatal hernia.  Visualized upper abdominal structures appear normal. There are no blastic  or lytic bone lesions. Visualized thyroid appears normal.  Review of the MIP images confirms the above findings.  IMPRESSION: Left base atelectatic change. No demonstrable pulmonary embolus. Small hiatal hernia.   Electronically Signed   By: Lowella Grip M.D.   On: 06/08/2013 10:28   Ct Biopsy  06/07/2013   CLINICAL DATA:  48 year old with pancytopenia. Tissue cultures have been requested.  EXAM: CT GUIDED BONE MARROW ASPIRATES AND BIOPSY  Physician: Stephan Minister. Anselm Pancoast, MD  MEDICATIONS: 4 mg versed, 200 mcg fentanyl. A radiology nurse monitored the patient for moderate sedation.  ANESTHESIA/SEDATION: Sedation time: 30 min  PROCEDURE: The procedure was explained to the patient. The risks and benefits of the procedure were discussed and the patient's questions were addressed. Informed consent was obtained from the patient. The patient was placed prone on CT scan. Images of the pelvis were obtained. The right side of back was prepped and draped in sterile fashion. The skin and right posterior iliac bone were anesthetized with 1% lidocaine. 11 gauge bone needle was directed into the right iliac bone with CT guidance. Two aspirates and 2 core biopsies obtained. One core biopsy was sent for microbiology.  FINDINGS: Needle placement in the right posterior iliac bone. Again noted is an enlarged and multi-lobulated uterus  with scattered calcifications. Findings are consistent with a fibroid uterus. Limited evaluation of the adnexal tissue.  COMPLICATIONS: None  IMPRESSION: CT guided bone marrow aspirates and core biopsy.   Electronically Signed   By: Markus Daft M.D.   On: 06/07/2013 17:44   Dg Chest Port 1 View  07/03/2013   CLINICAL DATA:  Fatigue  EXAM: PORTABLE CHEST - 1 VIEW  COMPARISON:  06/08/2013  FINDINGS: Cardiac shadow remains enlarged. The lungs are clear. No bony abnormality is seen. No gross soft tissue abnormality is noted.  IMPRESSION: No active disease.   Electronically Signed   By: Inez Catalina M.D.   On: 07/03/2013 19:17   Dg Chest Port 1 View  06/08/2013   CLINICAL DATA:  Acute onset chest pain.  EXAM: PORTABLE CHEST - 1 VIEW  COMPARISON:  PA and lateral chest 04/16/2013.  CT chest 04/16/2013.  FINDINGS: Heart size is mildly enlarged. The lungs are clear. No pneumothorax or pleural effusion. No focal bony abnormality.  IMPRESSION: Mild cardiomegaly without acute disease.   Electronically Signed   By: Inge Rise M.D.   On: 06/08/2013 03:53    CBC  Recent Labs Lab 06/30/13 0816 07/03/13 1837 07/04/13 0530  WBC 3.0* 1.3* 2.0*  HGB 10.2* 8.6* 8.4*  HCT 32.0* 27.3* 27.2*  PLT 247 200 181  MCV 75.5* 77.3* 76.6*  MCH 24.0* 24.4* 23.7*  MCHC 31.8 31.5 30.9  RDW 22.4* 20.9* 20.5*  LYMPHSABS 1.5 0.8  --   MONOABS 1.4* 0.4  --   EOSABS 0.0 0.0  --   BASOSABS 0.0 0.0  --     Chemistries   Recent Labs Lab 06/30/13 0817 07/03/13 1837 07/04/13 0530  NA 139 137 136*  K 3.8 4.2 4.1  CL  --  105 104  CO2 25 17* 19  GLUCOSE 121 108* 197*  BUN 14.9 10 11   CREATININE 1.0 0.64 0.56  CALCIUM 9.4 8.9 8.7  AST 9 46*  --   ALT 11 32  --   ALKPHOS 82 98  --   BILITOT 0.27 0.4  --    ------------------------------------------------------------------------------------------------------------------ estimated creatinine clearance is 90.8 ml/min (by C-G formula based on Cr of  0.56). ------------------------------------------------------------------------------------------------------------------  No results found for this basename: HGBA1C,  in the last 72 hours ------------------------------------------------------------------------------------------------------------------ No results found for this basename: CHOL, HDL, LDLCALC, TRIG, CHOLHDL, LDLDIRECT,  in the last 72 hours ------------------------------------------------------------------------------------------------------------------ No results found for this basename: TSH, T4TOTAL, FREET3, T3FREE, THYROIDAB,  in the last 72 hours ------------------------------------------------------------------------------------------------------------------  Recent Labs  07/03/13 1837  RETICCTPCT 2.5    Coagulation profile No results found for this basename: INR, PROTIME,  in the last 168 hours  No results found for this basename: DDIMER,  in the last 72 hours  Cardiac Enzymes No results found for this basename: CK, CKMB, TROPONINI, MYOGLOBIN,  in the last 168 hours ------------------------------------------------------------------------------------------------------------------ No components found with this basename: POCBNP,     Morgan Bean D.O. on 07/04/2013 at 8:08 AM  Between 7am to 7pm - Pager - [phone]  After 7pm go to [email or web address] - password [email or web address]  And look for the night coverage person covering for me after hours  [facility] Office  [phone]

## 2013-07-04 NOTE — Consult Note (Signed)
[place]  Telephone:([phone]) [place]  Consulting Provider: Heath Lark, MD PCP: [name], MD  Requesting Physician: Dr. Ree Kida   Reason for Admission: Febrile Neutropenia I have seen the patient, examined her and edited the notes as follows   HPI: Morgan Bean is an 48 y.o. female asked to see in consultation for febrile Neutropenia. She presented on 4/13 with tachycardia and fever up to 100.4. Her neutrophil was 0.1 for total WBC of 1.3. She was on Prednisone 60 mg a day, and was compliant to the medication. At the time of admission she denied subjective night sweats, cough, diarrhea or dysuria. She denied any sick contacts.   CXR was negative for acute disease.  Cultures were drawn, followed by initiation of IV Vanco,Cefepime and Diflucan.  Her WBC today is 2.0 without differential available. She is feeling well this morning and her fever is resolved. We were kindly informed of the patient's admission.  Hematologic History:  Morgan Bean was referred to the hematology service because of background of severe pancytopenia, lymphadenopathy and abnormal lymph node and bone marrow biopsy. Further evaluation suspected the patient may have syphilis, which was treated with doxycycline with worsening pancytopenia. Her antibiotic treatment was discontinued.  From 06/02/2013 to 06/09/2013, the patient was admitted to the hospital for further workup for neutropenic fever. She was placed on G-CSF with no response to therapy. Repeat bone marrow biopsy was done. The patient was given broad-spectrum intravenous antibiotics and subsequently discharged for further followup as an outpatient.  On 06/14/2013, she was started on  prednisone 60 mg daily. Her counts were monitored closely. 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 On her last evaluation, her white blood cell count was improving with resolution of  lymphadenopathy.  PMH:  Past Medical History  Diagnosis Date  . Coronary artery disease     Lexiscan myoview (10/12) with significant ST depression upon Lexiscan injection but no ischemia or infarction on perfusion images. Left heart cath (10/12): 30% mid LAD, 30% ostial D1, 50% ostial D2.    [name] Hypertension   . Tricuspid regurgitation   . Iron deficiency     hx of  . Polysubstance abuse     Prior cocaine  . Tobacco abuse   . Leukocytoclastic vasculitis     Rash across lower body, occurred in 9/11, diagnosed by skin biopsy. ANA positive. Thought to be secondary to cocaine use.  . Pulmonary HTN     Echo (9/11) with EF 65%, mild LVH, mild AI, mild MR, moderate to possibly severe TR with PA systolic pressure 52 mmHg. Echo (10/12): severe LV hypertrophy, EF 60-65%, mild MR, mild AI, moderate to severe tricuspid regurgitation, PA systolic pressure 38 mmHg.  Echo 4/14: moderate LVH, EF 65%, normal wall motion, diastolic dysfunction, mild AI, mild MR  . Asthma   . Shortness of breath     a. PFTs 5/14:  FEV1/FVC 99% predicted; FVC 60% predicted; DLCO mildly reduced, mild restriction, air trapping.;  b.  seen by pulmo (Dr. [name]) 5/14: mainly upper airway symptoms - ACE d/c'd (sx's better)  . Chronic diastolic CHF (congestive heart failure)   . Constipation   . Lymphadenopathy 03/21/2013  . Leukopenia 03/21/2013  . Unspecified deficiency anemia 03/29/2013  . Anginal pain     none in past year  . Anxiety   . Mental disorder   . Depression   . Stroke 2010ish  . Arthritis   . Pancytopenia   .  Hyperlipemia   . Syphilis 04/25/2013  . Candidiasis 06/23/2013    Surgeries:  Past Surgical History  Procedure Laterality Date  . Ankle surgery      right  . Cardiac catheterization    . I&d extremity  08/09/2011    Procedure: IRRIGATION AND DEBRIDEMENT EXTREMITY;  Surgeon: [name], MD;  Location: [facility];  Service: General;  Laterality: Left;  i & D Left axilla abscess  . Lymph node biopsy  N/A 04/05/2013    Procedure: EXCISIONAL BIOPSY RIGHT SUBMANDIBULAR NODE, NASAL ENDOSCOPIC WITH BIOPSY NASAL PHARYNX;  Surgeon: [name], MD;  Location: [facility];  Service: ENT;  Laterality: N/A;    Allergies:  Allergies  Allergen Reactions  . Ciprofloxacin Itching  . Lipitor [Atorvastatin] Swelling    Medications:   Scheduled Meds: . buPROPion  100 mg Oral BID  . ceFEPime (MAXIPIME) IV  2 g Intravenous Q8H  . citalopram  40 mg Oral Daily  . cloNIDine  0.3 mg Oral BID  . fluconazole  100 mg Oral Daily  . loratadine  10 mg Oral Daily  . multivitamin with minerals  1 tablet Oral Daily  . predniSONE  60 mg Oral Q breakfast  . sodium chloride  3 mL Intravenous Q12H  . traZODone  100 mg Oral QHS  . vancomycin  1,250 mg Intravenous Q12H   Continuous Infusions:  PRN Meds:.sodium chloride, acetaminophen, acetaminophen, albuterol, hydrOXYzine, polyethylene glycol, sodium chloride  Review of Systems  Constitutional: Negative for weight loss. Positive for fever, denies chills and malaise/fatigue.  HEENT:Denies blurred vision and double vision odynophagia, or oral lesions Respiratory:Denies cough,hemoptysis,or shortness of breath on exertion.  Cardiovascular:Denied chest pain. ZJ:IRCVEL nausea, vomiting diarrhea or constipation. No change in bowel caliber. No  Melena or Hematochezia.  FY:BOFBPZ blood in urine or loss of urinary control. Skin:Denies itching, new rashes,bruising or petechia Neurological:Denies headaches,motor or sensory deficits.No confusion. Review of system from all other organ systems were otherwise negative Family History:  Family History  Problem Relation Age of Onset  . Coronary artery disease Mother   . Diabetes Mother   . Breast cancer Maternal Grandmother   . Diabetes Maternal Grandmother   . Diabetes Father   . Hypertension Father   . Coronary artery disease Father     Social History:  reports that she quit smoking about 6 weeks ago. Her smoking use  included Cigarettes. She has a 7.5 pack-year smoking history. She has never used smokeless tobacco. She reports that she uses illicit drugs ("Crack" cocaine)but none for 2 weeks. She reports that she does not drink alcohol.  Physical Exam:   Filed Vitals:   07/04/13 0523  BP: 109/68  Pulse: 73  Temp: 97.3 F (36.3 C)  Resp: 18   GENERAL:alert, no distress and comfortable  SKIN: skin color, texture, turgor are normal, no rashes or significant lesions  EYES: normal, Conjunctiva are pink and non-injected, sclera clear  OROPHARYNX:no exudate, no erythema and lips, buccal mucosa, and tongue normal . No oral thrush. Poor dentition is noted  NECK: supple, thyroid normal size, non-tender, without nodularity  LYMPH: no palpable lymphadenopathy in the cervical, axillary or inguinal  LUNGS: clear to auscultation and percussion with normal breathing effort  HEART: regular rate and rhythm and no murmurs and no lower extremity edema  ABDOMEN:abdomen soft, non-tender and normal bowel sounds  Musculoskeletal:no cyanosis of digits and no clubbing  NEURO: alert and oriented x 3 with fluent speech, no focal motor/sensory deficits  LABS: CBC  Recent Labs Lab 06/30/13 0816 07/03/13 1837 07/04/13 0530  WBC 3.0* 1.3* 2.0*  HGB 10.2* 8.6* 8.4*  HCT 32.0* 27.3* 27.2*  PLT 247 200 181  MCV 75.5* 77.3* 76.6*  MCH 24.0* 24.4* 23.7*  MCHC 31.8 31.5 30.9  RDW 22.4* 20.9* 20.5*  LYMPHSABS 1.5 0.8  --   MONOABS 1.4* 0.4  --   EOSABS 0.0 0.0  --   BASOSABS 0.0 0.0  --      Anemia panel:   Recent Labs  07/03/13 1837  RETICCTPCT 2.5    CMP    Recent Labs Lab 06/30/13 0817 07/03/13 1837 07/04/13 0530  NA 139 137 136*  K 3.8 4.2 4.1  CL  --  105 104  CO2 25 17* 19  GLUCOSE 121 108* 197*  BUN 14.$Remov'9 10 11  'TODyRi$ CREATININE 1.0 0.64 0.56  CALCIUM 9.4 8.9 8.7  AST 9 46*  --   ALT 11 32  --   ALKPHOS 82 98  --   BILITOT 0.27 0.4  --      Imaging Studies: I. reviewed the imaging study  and agree with the interpretation  Dg Chest Port 1 View  07/03/2013   CLINICAL DATA:  Fatigue  EXAM: PORTABLE CHEST - 1 VIEW  COMPARISON:  06/08/2013  FINDINGS: Cardiac shadow remains enlarged. The lungs are clear. No bony abnormality is seen. No gross soft tissue abnormality is noted.  IMPRESSION: No active disease.   Electronically Signed   By: Inez Catalina M.D.   On: 07/03/2013 19:17      ASSESSMENT & PLAN:   #1 severe leukopenia  #2 recent neutropenic fever,resolved  Observation is recommended. Patient is on IV broad spectrum antibiotics and antifungals. Continue present therapy as per admitting team. Previously, she has no response to G-CSF. Her most recent white blood cell differential shows some mild monocytosis, which could be a sign of early recovery of granulocytes. I recommend another try G-CSF 480 mcg daily and 2 ANC is greater than 1500.   #3 Anemia  Due to acute on chronic disease. Her H/H is lower than in prior labs, in the setting of polypharmacy, IV fluids but no bleeding issues are noted. No transfusion is recommended at this time.  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  She has good response with recovery of her white blood cell count although she still is severely neutropenic.  Continue on prednisone 60 mg daily.   #5 History of syphilis with genital lesion  Infectious disease to see patient while in the hospital. from my examination, the previously noted syphilis lesion on the buttock area has resolved.    I would continue to follow on the patient. Please do not hesitate to contact me if questions arise.    Rondel Jumbo, PA-C/ Heath Lark, MD 07/04/2013 10:28 AM  Heath Lark, MD 07/04/2013

## 2013-07-04 NOTE — Progress Notes (Signed)
Brief Nutrition Note   RD drawn to patient for Positive Malnutrition Screening Tool Score   Ht: 5'6" Wt: 180 lbs BMI: 29.2   Diet: Heart Healthy eating 100%   48 yo female with h/o crack abuse, cad, diastolic chf and recent hospitalization for neutopenic fever of unclear etiology 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 Pt denies any n/v/d and abdominal pain.   Pt has a great appetite and is eating very well. Pt states that her normal weight is within 170-180 lb, and she is currently weighing in at 180 lb. She has no significant weight loss. The Nutrition Focused Physical exam revealed no depletion of muscle mass or fat stores. Pt is well nourished and has no nutrition concerns at this time. Pt's dietary recall included protein shakes, sandwiches and fruit. Pt explained that she drinks 2 protein shakes a day.   Labs and medications reviewed.  Low sodium and elevated glucose.  Lab Results  Component Value Date   HGBA1C 5.5 04/06/2012   No interventions needed at this time, please consult if needed.   Avel Peace, BS Nutrition Intern Pager: [phone]

## 2013-07-04 NOTE — Progress Notes (Signed)
Received report from [place] at [facility]. Room ready and awaiting arrival of pt to unit.

## 2013-07-04 NOTE — Progress Notes (Signed)
ANTIBIOTIC CONSULT NOTE - INITIAL  Pharmacy Consult for vancomycin  Indication: febrile neutropenia   Allergies  Allergen Reactions  . Ciprofloxacin Itching  . Lipitor [Atorvastatin] Swelling    Patient Measurements: Height: 5\' 6"  (167.6 cm) Weight: 180 lb 11.2 oz (81.965 kg) IBW/kg (Calculated) : 59.3 Adjusted Body Weight:   Vital Signs: Temp: 97.5 F (36.4 C) (04/14 0100) Temp src: Oral (04/14 0100) BP: 133/90 mmHg (04/14 0100) Pulse Rate: 85 (04/14 0100) Intake/Output from previous day:   Intake/Output from this shift:    Labs:  Recent Labs  07/03/13 1837  WBC 1.3*  HGB 8.6*  PLT 200  CREATININE 0.64   Estimated Creatinine Clearance: 90.8 ml/min (by C-G formula based on Cr of 0.64). No results found for this basename: VANCOTROUGH, Corlis Leak, VANCORANDOM, GENTTROUGH, GENTPEAK, GENTRANDOM, TOBRATROUGH, TOBRAPEAK, TOBRARND, AMIKACINPEAK, AMIKACINTROU, AMIKACIN,  in the last 72 hours   Microbiology: Recent Results (from the past 720 hour(s))  TISSUE CULTURE     Status: None   Collection Time    06/07/13 12:38 PM      Result Value Ref Range Status   Specimen Description TISSUE BONE MARROW   Final   Special Requests NONE   Final   Gram Stain     Final   Value: NO WBC SEEN     NO SQUAMOUS EPITHELIAL CELLS SEEN     NO ORGANISMS SEEN     Performed at [facility]   Culture     Final   Value: NO GROWTH 3 DAYS     Performed at [facility]   Report Status 06/11/2013 FINAL   Final  FUNGUS CULTURE W SMEAR     Status: None   Collection Time    06/07/13 12:38 PM      Result Value Ref Range Status   Specimen Description TISSUE BONE MARROW   Final   Special Requests NONE   Final   Fungal Smear     Final   Value: NO YEAST OR FUNGAL ELEMENTS SEEN     Performed at [facility]   Culture     Final   Value: CULTURE IN PROGRESS FOR FOUR WEEKS     Performed at [facility]   Report Status PENDING   Incomplete  AFB CULTURE WITH SMEAR      Status: None   Collection Time    06/07/13 12:38 PM      Result Value Ref Range Status   Specimen Description TISSUE BONE MARROW   Final   Special Requests NONE   Final   ACID FAST SMEAR     Final   Value: NO ACID FAST BACILLI SEEN     Performed at [facility]   Culture     Final   Value: CULTURE WILL BE EXAMINED FOR 6 WEEKS BEFORE ISSUING A FINAL REPORT     Performed at [facility]   Report Status PENDING   Incomplete    Medical History: Past Medical History  Diagnosis Date  . Coronary artery disease     Lexiscan myoview (10/12) with significant ST depression upon Lexiscan injection but no ischemia or infarction on perfusion images. Left heart cath (10/12): 30% mid LAD, 30% ostial D1, 50% ostial D2.    [name] Hypertension   . Tricuspid regurgitation   . Iron deficiency     hx of  . Polysubstance abuse     Prior cocaine  . Tobacco abuse   . Leukocytoclastic vasculitis  Rash across lower body, occurred in 9/11, diagnosed by skin biopsy. ANA positive. Thought to be secondary to cocaine use.  . Pulmonary HTN     Echo (9/11) with EF 65%, mild LVH, mild AI, mild MR, moderate to possibly severe TR with PA systolic pressure 52 mmHg. Echo (10/12): severe LV hypertrophy, EF 60-65%, mild MR, mild AI, moderate to severe tricuspid regurgitation, PA systolic pressure 38 mmHg.  Echo 4/14: moderate LVH, EF 65%, normal wall motion, diastolic dysfunction, mild AI, mild MR  . Asthma   . Shortness of breath     a. PFTs 5/14:  FEV1/FVC 99% predicted; FVC 60% predicted; DLCO mildly reduced, mild restriction, air trapping.;  b.  seen by pulmo (Dr. [name]) 5/14: mainly upper airway symptoms - ACE d/c'd (sx's better)  . Chronic diastolic CHF (congestive heart failure)   . Constipation   . Lymphadenopathy 03/21/2013  . Leukopenia 03/21/2013  . Unspecified deficiency anemia 03/29/2013  . Anginal pain     none in past year  . Anxiety   . Mental disorder   . Depression   . Stroke  2010ish  . Arthritis   . Pancytopenia   . Hyperlipemia   . Syphilis 04/25/2013  . Candidiasis 06/23/2013    Medications:  Prescriptions prior to admission  Medication Sig Dispense Refill  . albuterol (PROVENTIL HFA;VENTOLIN HFA) 108 (90 BASE) MCG/ACT inhaler Inhale into the lungs every 6 (six) hours as needed for wheezing or shortness of breath.      [name] amLODipine (NORVASC) 10 MG tablet TAKE ONE TABLET DAILY  30 tablet  1  . aspirin 81 MG tablet Take 81 mg by mouth daily.      [name] buPROPion (WELLBUTRIN) 100 MG tablet Take 100 mg by mouth 2 (two) times daily.      . citalopram (CELEXA) 40 MG tablet Take 40 mg by mouth daily.      . cloNIDine (CATAPRES) 0.3 MG tablet Take 0.3 mg by mouth 2 (two) times daily.      . fluconazole (DIFLUCAN) 100 MG tablet Take 1 tablet (100 mg total) by mouth daily.  7 tablet  0  . furosemide (LASIX) 20 MG tablet Take 1 tablet (20 mg total) by mouth 2 (two) times daily.  60 tablet  11  . hydrOXYzine (ATARAX/VISTARIL) 25 MG tablet Take 25 mg by mouth at bedtime as needed (sleep).      . loratadine (CLARITIN) 10 MG tablet Take 1 tablet (10 mg total) by mouth daily.  30 tablet  3  . Multiple Vitamin (MULTIVITAMIN WITH MINERALS) TABS Take 1 tablet by mouth daily.      [name] omeprazole (PRILOSEC) 40 MG capsule Take 40 mg by mouth daily.      . polyethylene glycol powder (GLYCOLAX/MIRALAX) powder Take 17 g by mouth daily as needed (constipation).       . potassium chloride (K-DUR,KLOR-CON) 10 MEQ tablet Take 1 tablet (10 mEq total) by mouth 2 (two) times daily with a meal.  60 tablet  11  . predniSONE (DELTASONE) 20 MG tablet Take 3 tablets (60 mg total) by mouth daily with breakfast.  90 tablet  2  . traZODone (DESYREL) 100 MG tablet Take 100 mg by mouth at bedtime.      [name] albuterol (PROVENTIL HFA;VENTOLIN HFA) 108 (90 BASE) MCG/ACT inhaler Inhale 2 puffs into the lungs every 4 (four) hours as needed for wheezing or shortness of breath.       Assessment: Febrile neutropenic  fever of unknown  origin. Cefepime ordered. Vanc 1gm given at ~2000 on 4-13.   Goal of Therapy:  Vancomycin trough level 15-20 mcg/ml  Plan:  Continue vancomycin 1250 q8h next dose 0600  Curlene Dolphin 07/04/2013,1:09 AM

## 2013-07-04 NOTE — ED Provider Notes (Signed)
Medical screening examination/treatment/procedure(s) were conducted as a shared visit with non-physician practitioner(s) and myself.  I personally evaluated the patient during the encounter.  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 Sent from PCP office with neutropenic fever. No CP, SOB, cough, sore throat, nausea, vomiting. Nontoxic appearing, CTAB, RRR.    EKG Interpretation None       Ezequiel Essex, MD 07/04/13 1006

## 2013-07-04 NOTE — Consult Note (Addendum)
[facility] Infectious Disease    Date of Admission:  07/03/2013  Date of Consult:  07/04/2013  Reason for Consult: neutropenic fevers Referring Physician: Dr. Ree Kida   HPI: Morgan Bean is an 47 y.o. female with pancytopenia of unclear cause who has been followed closely Dr. Alvy Bimler and has been on an empiric trial of prednisone. She was seeing her PCP today and was noted to have a temperatured of 100.4 with high HR. According to the patient she actually is not complaining of any specific symptoms. Patient had been Neutropenic as she had been for several months now. She was transferred to ED and admitted to [facility]. Urine analysis 0-2 wbc. Urine culture taken --> no growth. Blood cultures taken. She was started on vancomycin, cefepime and continued on fluconazole which she had started recently for oral thrush that she developed while on fluconazole.  We were consulted to assist with further workup and management.     Past Medical History  Diagnosis Date  . Coronary artery disease     Lexiscan myoview (10/12) with significant ST depression upon Lexiscan injection but no ischemia or infarction on perfusion images. Left heart cath (10/12): 30% mid LAD, 30% ostial D1, 50% ostial D2.    [name] Hypertension   . Tricuspid regurgitation   . Iron deficiency     hx of  . Polysubstance abuse     Prior cocaine  . Tobacco abuse   . Leukocytoclastic vasculitis     Rash across lower body, occurred in 9/11, diagnosed by skin biopsy. ANA positive. Thought to be secondary to cocaine use.  . Pulmonary HTN     Echo (9/11) with EF 65%, mild LVH, mild AI, mild MR, moderate to possibly severe TR with PA systolic pressure 52 mmHg. Echo (10/12): severe LV hypertrophy, EF 60-65%, mild MR, mild AI, moderate to severe tricuspid regurgitation, PA systolic pressure 38 mmHg.  Echo 4/14: moderate LVH, EF 65%, normal wall motion, diastolic dysfunction, mild AI, mild MR  . Asthma   . Shortness of breath    a. PFTs 5/14:  FEV1/FVC 99% predicted; FVC 60% predicted; DLCO mildly reduced, mild restriction, air trapping.;  b.  seen by pulmo (Dr. [name]) 5/14: mainly upper airway symptoms - ACE d/c'd (sx's better)  . Chronic diastolic CHF (congestive heart failure)   . Constipation   . Lymphadenopathy 03/21/2013  . Leukopenia 03/21/2013  . Unspecified deficiency anemia 03/29/2013  . Anginal pain     none in past year  . Anxiety   . Mental disorder   . Depression   . Stroke 2010ish  . Arthritis   . Pancytopenia   . Hyperlipemia   . Syphilis 04/25/2013  . Candidiasis 06/23/2013    Past Surgical History  Procedure Laterality Date  . Ankle surgery      right  . Cardiac catheterization    . I&d extremity  08/09/2011    Procedure: IRRIGATION AND DEBRIDEMENT EXTREMITY;  Surgeon: [name], MD;  Location: [facility];  Service: General;  Laterality: Left;  i & D Left axilla abscess  . Lymph node biopsy N/A 04/05/2013    Procedure: EXCISIONAL BIOPSY RIGHT SUBMANDIBULAR NODE, NASAL ENDOSCOPIC WITH BIOPSY NASAL PHARYNX;  Surgeon: [name], MD;  Location: [facility];  Service: ENT;  Laterality: N/A;  ergies:   Allergies  Allergen Reactions  . Ciprofloxacin Itching  . Lipitor [Atorvastatin] Swelling     Medications: I have reviewed patients current medications as documented in Epic Anti-infectives  Start     Dose/Rate Route Frequency Ordered Stop   07/04/13 1000  fluconazole (DIFLUCAN) tablet 100 mg     100 mg Oral Daily 07/04/13 0052     07/04/13 0600  vancomycin (VANCOCIN) 1,250 mg in sodium chloride 0.9 % 250 mL IVPB     1,250 mg 166.7 mL/hr over 90 Minutes Intravenous Every 12 hours 07/04/13 0113     07/04/13 0115  vancomycin (VANCOCIN) 1,250 mg in sodium chloride 0.9 % 250 mL IVPB  Status:  Discontinued     1,250 mg 166.7 mL/hr over 90 Minutes Intravenous Every 12 hours 07/04/13 0112 07/04/13 0113   07/03/13 2000  ceFEPIme (MAXIPIME) 2 g in dextrose 5 % 50 mL IVPB     2 g 100 mL/hr over 30  Minutes Intravenous Every 8 hours 07/03/13 1936     07/03/13 1930  vancomycin (VANCOCIN) IVPB 1000 mg/200 mL premix     1,000 mg 200 mL/hr over 60 Minutes Intravenous  Once 07/03/13 1917 07/03/13 2129      Social History:  reports that she quit smoking about 6 weeks ago. Her smoking use included Cigarettes. She has a 7.5 pack-year smoking history. She has never used smokeless tobacco. She reports that she uses illicit drugs ("Crack" cocaine) about once per week. She reports that she does not drink alcohol.  Family History  Problem Relation Age of Onset  . Coronary artery disease Mother   . Diabetes Mother   . Breast cancer Maternal Grandmother   . Diabetes Maternal Grandmother   . Diabetes Father   . Hypertension Father   . Coronary artery disease Father     As in HPI and primary teams notes otherwise 12 point review of systems is negative  Blood pressure 118/81, pulse 70, temperature 97.9 F (36.6 C), temperature source Oral, resp. rate 19, height _0  (1.676 m), weight 180 lb 11.2 oz (81.965 kg), last menstrual period 04/24/2013, SpO2 100.00%. General: Alert and awake, oriented x3, not in any acute distress. HEENT: anicteric sclera, pupils reactive to light and accommodation, EOMI, oropharynx clear and without exudate CVS regular rate, normal r,  no murmur rubs or gallops Chest: clear to auscultation bilaterally, no wheezing, rales or rhonchi Abdomen: soft nontender, nondistended, normal bowel sounds, Extremities: no  clubbing or edema noted bilaterally Skin: no rashes Neuro: nonfocal, strength and sensation intact   Results for orders placed during the hospital encounter of 07/03/13 (from the past 48 hour(s))  CBC WITH DIFFERENTIAL     Status: Abnormal   Collection Time    07/03/13  6:37 PM      Result Value Ref Range   WBC 1.3 (*) 4.0 - 10.5 K/uL   Comment: WHITE COUNT CONFIRMED ON SMEAR     REPEATED TO VERIFY     CRITICAL RESULT CALLED TO, READ BACK BY AND VERIFIED  WITH:     Donnelly Stager RN 2014 07/03/13 A BROWNING   RBC 3.53 (*) 3.87 - 5.11 MIL/uL   Hemoglobin 8.6 (*) 12.0 - 15.0 g/dL   HCT 27.3 (*) 36.0 - 46.0 %   MCV 77.3 (*) 78.0 - 100.0 fL   MCH 24.4 (*) 26.0 - 34.0 pg   MCHC 31.5  30.0 - 36.0 g/dL   RDW 20.9 (*) 11.5 - 15.5 %   Platelets 200  150 - 400 K/uL   Neutrophils Relative % 4 (*) 43 - 77 %   Lymphocytes Relative 60 (*) 12 - 46 %   Monocytes Relative 33 (*)  3 - 12 %   Eosinophils Relative 1  0 - 5 %   Basophils Relative 2 (*) 0 - 1 %   Neutro Abs 0.1 (*) 1.7 - 7.7 K/uL   Lymphs Abs 0.8  0.7 - 4.0 K/uL   Monocytes Absolute 0.4  0.1 - 1.0 K/uL   Eosinophils Absolute 0.0  0.0 - 0.7 K/uL   Basophils Absolute 0.0  0.0 - 0.1 K/uL   RBC Morphology POLYCHROMASIA PRESENT     Smear Review LARGE PLATELETS PRESENT    COMPREHENSIVE METABOLIC PANEL     Status: Abnormal   Collection Time    07/03/13  6:37 PM      Result Value Ref Range   Sodium 137  137 - 147 mEq/L   Potassium 4.2  3.7 - 5.3 mEq/L   Chloride 105  96 - 112 mEq/L   CO2 17 (*) 19 - 32 mEq/L   Glucose, Bld 108 (*) 70 - 99 mg/dL   BUN 10  6 - 23 mg/dL   Creatinine, Ser 0.64  0.50 - 1.10 mg/dL   Calcium 8.9  8.4 - 10.5 mg/dL   Total Protein 8.5 (*) 6.0 - 8.3 g/dL   Albumin 2.6 (*) 3.5 - 5.2 g/dL   AST 46 (*) 0 - 37 U/L   ALT 32  0 - 35 U/L   Alkaline Phosphatase 98  39 - 117 U/L   Total Bilirubin 0.4  0.3 - 1.2 mg/dL   GFR calc non Af Amer >90  >90 mL/min   GFR calc Af Amer >90  >90 mL/min   Comment: (NOTE)     The eGFR has been calculated using the CKD EPI equation.     This calculation has not been validated in all clinical situations.     eGFR's persistently <90 mL/min signify possible Chronic Kidney     Disease.  RETICULOCYTES     Status: Abnormal   Collection Time    07/03/13  6:37 PM      Result Value Ref Range   Retic Ct Pct 2.5  0.4 - 3.1 %   RBC. 3.58 (*) 3.87 - 5.11 MIL/uL   Retic Count, Manual 89.5  19.0 - 186.0 K/uL  PATHOLOGIST SMEAR REVIEW     Status: None     Collection Time    07/03/13  6:37 PM      Result Value Ref Range   Path Review HYPOCHROMIC ANEMIA.     Comment: NEUTROPENIA.     Reviewed by Lennox Solders Lyndon Code, M.D.     07/04/13.  I-STAT CG4 LACTIC ACID, ED     Status: None   Collection Time    07/03/13  7:25 PM      Result Value Ref Range   Lactic Acid, Venous 1.87  0.5 - 2.2 mmol/L  URINALYSIS, ROUTINE W REFLEX MICROSCOPIC     Status: Abnormal   Collection Time    07/03/13  9:20 PM      Result Value Ref Range   Color, Urine YELLOW  YELLOW   APPearance CLEAR  CLEAR   Specific Gravity, Urine 1.021  1.005 - 1.030   pH 5.5  5.0 - 8.0   Glucose, UA NEGATIVE  NEGATIVE mg/dL   Hgb urine dipstick SMALL (*) NEGATIVE   Bilirubin Urine NEGATIVE  NEGATIVE   Ketones, ur NEGATIVE  NEGATIVE mg/dL   Protein, ur NEGATIVE  NEGATIVE mg/dL   Urobilinogen, UA 0.2  0.0 - 1.0 mg/dL   Nitrite NEGATIVE  NEGATIVE   Leukocytes, UA NEGATIVE  NEGATIVE  URINE CULTURE     Status: None   Collection Time    07/03/13  9:20 PM      Result Value Ref Range   Specimen Description URINE, CLEAN CATCH     Special Requests NONE     Culture  Setup Time       Value: 07/03/2013 22:16     Performed at [facility]       Value: NO GROWTH     Performed at [facility]   Culture       Value: NO GROWTH     Performed at [facility]   Report Status 07/04/2013 FINAL    PREGNANCY, URINE     Status: None   Collection Time    07/03/13  9:20 PM      Result Value Ref Range   Preg Test, Ur NEGATIVE  NEGATIVE   Comment:            THE SENSITIVITY OF THIS     METHODOLOGY IS >20 mIU/mL.  URINE MICROSCOPIC-ADD ON     Status: None   Collection Time    07/03/13  9:20 PM      Result Value Ref Range   Squamous Epithelial / LPF RARE  RARE   WBC, UA 0-2  <3 WBC/hpf   RBC / HPF 7-10  <3 RBC/hpf   Bacteria, UA RARE  RARE   Urine-Other MUCOUS PRESENT    URINE RAPID DRUG SCREEN (HOSP PERFORMED)     Status: None   Collection Time     07/03/13  9:20 PM      Result Value Ref Range   Opiates NONE DETECTED  NONE DETECTED   Cocaine NONE DETECTED  NONE DETECTED   Benzodiazepines NONE DETECTED  NONE DETECTED   Amphetamines NONE DETECTED  NONE DETECTED   Tetrahydrocannabinol NONE DETECTED  NONE DETECTED   Barbiturates NONE DETECTED  NONE DETECTED   Comment:            DRUG SCREEN FOR MEDICAL PURPOSES     ONLY.  IF CONFIRMATION IS NEEDED     FOR ANY PURPOSE, NOTIFY LAB     WITHIN 5 DAYS.                LOWEST DETECTABLE LIMITS     FOR URINE DRUG SCREEN     Drug Class       Cutoff (ng/mL)     Amphetamine      1000     Barbiturate      200     Benzodiazepine   597     Tricyclics       416     Opiates          300     Cocaine          300     THC              50  BASIC METABOLIC PANEL     Status: Abnormal   Collection Time    07/04/13  5:30 AM      Result Value Ref Range   Sodium 136 (*) 137 - 147 mEq/L   Potassium 4.1  3.7 - 5.3 mEq/L   Chloride 104  96 - 112 mEq/L   CO2 19  19 - 32 mEq/L   Glucose, Bld 197 (*) 70 - 99 mg/dL   BUN 11  6 - 23 mg/dL   Creatinine, Ser 0.56  0.50 - 1.10 mg/dL   Calcium 8.7  8.4 - 10.5 mg/dL   GFR calc non Af Amer >90  >90 mL/min   GFR calc Af Amer >90  >90 mL/min   Comment: (NOTE)     The eGFR has been calculated using the CKD EPI equation.     This calculation has not been validated in all clinical situations.     eGFR's persistently <90 mL/min signify possible Chronic Kidney     Disease.  CBC     Status: Abnormal   Collection Time    07/04/13  5:30 AM      Result Value Ref Range   WBC 2.0 (*) 4.0 - 10.5 K/uL   RBC 3.55 (*) 3.87 - 5.11 MIL/uL   Hemoglobin 8.4 (*) 12.0 - 15.0 g/dL   HCT 27.2 (*) 36.0 - 46.0 %   MCV 76.6 (*) 78.0 - 100.0 fL   MCH 23.7 (*) 26.0 - 34.0 pg   MCHC 30.9  30.0 - 36.0 g/dL   RDW 20.5 (*) 11.5 - 15.5 %   Platelets 181  150 - 400 K/uL  DIFFERENTIAL     Status: Abnormal   Collection Time    07/04/13  5:30 AM      Result Value Ref Range    Neutrophils Relative % 1 (*) 43 - 77 %   Lymphocytes Relative 64 (*) 12 - 46 %   Monocytes Relative 35 (*) 3 - 12 %   Eosinophils Relative 0  0 - 5 %   Basophils Relative 0  0 - 1 %   Neutro Abs 0.0 (*) 1.7 - 7.7 K/uL   Lymphs Abs 1.3  0.7 - 4.0 K/uL   Monocytes Absolute 0.7  0.1 - 1.0 K/uL   Eosinophils Absolute 0.0  0.0 - 0.7 K/uL   Basophils Absolute 0.0  0.0 - 0.1 K/uL   RBC Morphology SPHEROCYTES        Component Value Date/Time   SDES URINE, CLEAN CATCH 07/03/2013 2120   [place] 07/03/2013 2120   CULT  Value: NO GROWTH Performed at [facility] [address] 07/03/2013 2120   REPTSTATUS 07/04/2013 FINAL 07/03/2013 2120   Dg Chest Port 1 View  07/03/2013   CLINICAL DATA:  Fatigue  EXAM: PORTABLE CHEST - 1 VIEW  COMPARISON:  06/08/2013  FINDINGS: Cardiac shadow remains enlarged. The lungs are clear. No bony abnormality is seen. No gross soft tissue abnormality is noted.  IMPRESSION: No active disease.   Electronically Signed   By: Inez Catalina M.D.   On: 07/03/2013 19:17     Recent Results (from the past 720 hour(s))  TISSUE CULTURE     Status: None   Collection Time    06/07/13 12:38 PM      Result Value Ref Range Status   Specimen Description TISSUE BONE MARROW   Final   Special Requests NONE   Final   Gram Stain     Final   Value: NO WBC SEEN     NO SQUAMOUS EPITHELIAL CELLS SEEN     NO ORGANISMS SEEN     Performed at [facility]   Culture     Final   Value: NO GROWTH 3 DAYS     Performed at [facility]   Report Status 06/11/2013 FINAL   Final  FUNGUS CULTURE W SMEAR     Status: None   Collection Time  06/07/13 12:38 PM      Result Value Ref Range Status   Specimen Description TISSUE BONE MARROW   Final   Special Requests NONE   Final   Fungal Smear     Final   Value: NO YEAST OR FUNGAL ELEMENTS SEEN     Performed at [facility]   Culture     Final   Value: CULTURE IN PROGRESS FOR FOUR WEEKS     Performed at [facility]    Report Status PENDING   Incomplete  AFB CULTURE WITH SMEAR     Status: None   Collection Time    06/07/13 12:38 PM      Result Value Ref Range Status   Specimen Description TISSUE BONE MARROW   Final   Special Requests NONE   Final   ACID FAST SMEAR     Final   Value: NO ACID FAST BACILLI SEEN     Performed at [facility]   Culture     Final   Value: CULTURE WILL BE EXAMINED FOR 6 WEEKS BEFORE ISSUING A FINAL REPORT     Performed at [facility]   Report Status PENDING   Incomplete  URINE CULTURE     Status: None   Collection Time    07/03/13  9:20 PM      Result Value Ref Range Status   Specimen Description URINE, CLEAN CATCH   Final   Special Requests NONE   Final   Culture  Setup Time     Final   Value: 07/03/2013 22:16     Performed at [facility]     Final   Value: NO GROWTH     Performed at [facility]   Culture     Final   Value: NO GROWTH     Performed at [facility]   Report Status 07/04/2013 FINAL   Final     Impression/Recommendation  Principal Problem:   Neutropenic fever Active Problems:   Chronic diastolic heart failure   Cocaine abuse   Polysubstance abuse   Unspecified deficiency anemia   Gisela Stawicki is a 48 y.o. female with  Severe neutropenia, pancytopenia, lymphadenopathy without clear cause on empiric course of steroids by Dr. Alvy Bimler.   Of note she has had NP biopsy, LN biopsy, two bone marrow biopsies with last one including tissue for AFB and fungal cultures which were negative She tested + for syphilis but with VERY low titer of 1:2 in 2015 having previously had titer of 1:4 in 2009 and NR titer in 2011. She had a repeat course of doxycycline for syphilis this year. She also has had a FALSE + HIV EIA (confirmatory test and RNA negative)  She has a prior biopsy of lesion on her leg read as c/w leukocytoclastic vasculitis   At that time there was suspicion of cocaine stabilizer  "levmisole" induced vasculitis. She does continue to use cocaine --"for a taste"  #1 Neutropenic fevers: She does not technically meet the febrile part of neutropenic fevers with one isolated temp of 100.4, though she is SEVERELY neutropenic  For now the vancomycin and cefepime are reasonable though I would de-escalate per guidelines for neutropenic fevers  I am not yet anxious to pursue aggressive workup such as repeat CT of chest abdomen and pelvis at this point in time  If anything I  Would consider repeat CT Neck where she extensive LN pathology in March (  note she has had at least 5 CT scans including TWO CTA since November)  I have counselled the patient to stop using cocaine ALTOGETHER as I cannot help but wonder if a cocaine (levamisole) induced vasculitis could be in play here though I have never seen levimasole vasculitis cause pancytopenia     #2 Pancytopenia with severe neutropenia  And lymphadenopathy.  Defer to oncology re her ssx of lymphadenopathy and pancytopenia but in my brief  Refresher of  my knowledge from [place] with re to Levamisole I found that it can cause severe agranulocytosis:  "Levamisole is a common adulterant of cocaine that can cause agranulocytosis, leukoencephalopathy, or cutaneous vasculitis, possibly leading to cutaneous necrosis   A review of published cases of levamisole-related toxicity noted that 52 percent of these patients presented with oropharyngeal complaints, while 27 percent presented with soft tissue infections or purpura   Exposure to cocaine adulterated with levamisole should be suspected in patients with these entities or complaints.  [employer] first noted levamisole mixed with cocaine in 2003 and beginning in 2008 investigators in [place] reported that some patients who had been exposed to cocaine were developing agranulocytosis (confirmed by bone marrow histopathology)  . 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 At the time we initially thought that she was suffering from [facility] and even went so far as to give her IV cytoxan before we finally diagnosed her with this condition while on the house staff service.   The only long term solution for this PROBLEM IS CESSATION of the offending agent.  This patient has already been thought to have levamisole induced soft tissue vasculitis in 2011 and she has NEVER stopped using cocaine.  I THINK SHE NEED EXTENSIVE COUNSELINNG ON IMPORTANCE OF ABSTINENCE OF COCAINE IN CASE THIS IS CAUSING ALL OF HER PROBLEMS OF PANCYTOPENIA, CERVICAL LYMPHADENOPATHY etc.   I would consider consultation with Rheumatology and even correspondence of authors with expertise on LEVAMISOLE toxcity. Apparently the agent can be detected in urine but has a very short half life.  I spent greater than 25 minutes with the patient including greater than 50% of time in face to face counsel of the patient and in coordination of their care.   Dr. Baxter Flattery will be covering tomorrow.    07/04/2013, 7:08 PM   Thank you so much for this interesting consult  [place] for [place] [phone] (pager) [phone] (office) 07/04/2013, 7:08 PM  [place] 07/04/2013, 7:08 PM

## 2013-07-05 LAB — CBC WITH DIFFERENTIAL/PLATELET
Basophils Absolute: 0 10*3/uL (ref 0.0–0.1)
Basophils Relative: 1 % (ref 0–1)
Eosinophils Absolute: 0 10*3/uL (ref 0.0–0.7)
Eosinophils Relative: 1 % (ref 0–5)
HCT: 27.4 % — ABNORMAL LOW (ref 36.0–46.0)
HEMOGLOBIN: 8.8 g/dL — AB (ref 12.0–15.0)
LYMPHS PCT: 55 % — AB (ref 12–46)
Lymphs Abs: 1.1 10*3/uL (ref 0.7–4.0)
MCH: 24.4 pg — ABNORMAL LOW (ref 26.0–34.0)
MCHC: 32.1 g/dL (ref 30.0–36.0)
MCV: 75.9 fL — ABNORMAL LOW (ref 78.0–100.0)
MONOS PCT: 40 % — AB (ref 3–12)
Monocytes Absolute: 0.8 10*3/uL (ref 0.1–1.0)
Neutro Abs: 0.1 10*3/uL — ABNORMAL LOW (ref 1.7–7.7)
Neutrophils Relative %: 3 % — ABNORMAL LOW (ref 43–77)
Platelets: 180 10*3/uL (ref 150–400)
RBC: 3.61 MIL/uL — ABNORMAL LOW (ref 3.87–5.11)
RDW: 20.7 % — ABNORMAL HIGH (ref 11.5–15.5)
WBC: 2 10*3/uL — ABNORMAL LOW (ref 4.0–10.5)

## 2013-07-05 NOTE — Progress Notes (Signed)
[place]  [phone]    HOSPITAL PROGRESS NOTE I have seen the patient, examined her and edited the notes as follows  No new events overnight. Afebrile. Denies chills, night sweats or subjective fevers.Denies nausea, vomiting or diarrhea. Appetite is good. No respiratory or cardiac complaints. No confusion. She received first dose of Neupogen 480 mcg on 4/14. WBC today is 2.0 with ANC of 0.1. Urine Culture negative to date. Blood Culture pending.  MEDICATIONS:  Scheduled Meds: . buPROPion  100 mg Oral BID  . ceFEPime (MAXIPIME) IV  2 g Intravenous Q8H  . citalopram  40 mg Oral Daily  . cloNIDine  0.3 mg Oral BID  . filgrastim (NEUPOGEN)  SQ  480 mcg Subcutaneous q1800  . fluconazole  100 mg Oral Daily  . loratadine  10 mg Oral Daily  . multivitamin with minerals  1 tablet Oral Daily  . predniSONE  60 mg Oral Q breakfast  . sodium chloride  3 mL Intravenous Q12H  . traZODone  100 mg Oral QHS  . vancomycin  1,250 mg Intravenous Q12H   Continuous Infusions:  PRN Meds:.sodium chloride, acetaminophen, acetaminophen, albuterol, hydrOXYzine, polyethylene glycol, sodium chloride  ALLERGIES:  Allergies  Allergen Reactions  . Ciprofloxacin Itching  . Lipitor [Atorvastatin] Swelling     PHYSICAL EXAMINATION:   Filed Vitals:   07/05/13 0548  BP: 132/96  Pulse: 78  Temp: 97.6 F (36.4 C)  Resp: 18   Filed Weights   07/03/13 1812 07/04/13 0100 07/05/13 0329  Weight: 81.194 kg (179 lb) 81.965 kg (180 lb 11.2 oz) 82.736 kg (182 lb 6.4 oz)    GENERAL:alert, no distress and comfortable  SKIN: skin color,texture, turgor are normal, no rashes or significant lesions  EYES: normal, Conjunctiva are pink and non-injected, sclera clear  OROPHARYNX:no exudate, no erythema and lips buccal mucosa, and tongue normal.No oral thrush. Poor dentition is noted  NECK: supple, thyroid normal size, non-tender, without nodularity  LYMPH: no palpable  lymphadenopathy in the cervical, axillary or inguinal  LUNGS: clear to auscultation and percussion with normal breathing effort  HEART: regular rate and rhythm and no murmurs and no lower extremity edema  ABDOMEN:abdomen soft,non-tender and normal bowel sounds  Extremities:no cyanosis of digits and no clubbing NEURO: alert and oriented x 3 with fluent speech, no focal motor/sensory deficits  LABORATORY/RADIOLOGY DATA:   Recent Labs Lab 06/30/13 0816 07/03/13 1837 07/04/13 0530 07/05/13 0346  WBC 3.0* 1.3* 2.0* 2.0*  HGB 10.2* 8.6* 8.4* 8.8*  HCT 32.0* 27.3* 27.2* 27.4*  PLT 247 200 181 180  MCV 75.5* 77.3* 76.6* 75.9*  MCH 24.0* 24.4* 23.7* 24.4*  MCHC 31.8 31.5 30.9 32.1  RDW 22.4* 20.9* 20.5* 20.7*  LYMPHSABS 1.5 0.8 1.3 1.1  MONOABS 1.4* 0.4 0.7 0.8  EOSABS 0.0 0.0 0.0 0.0  BASOSABS 0.0 0.0 0.0 0.0    CMP    Recent Labs Lab 06/30/13 0817 07/03/13 1837 07/04/13 0530  NA 139 137 136*  K 3.8 4.2 4.1  CL  --  105 104  CO2 25 17* 19  GLUCOSE 121 108* 197*  BUN 14.9 10 11   CREATININE 1.0 0.64 0.56  CALCIUM 9.4 8.9 8.7  AST 9 46*  --   ALT 11 32  --   ALKPHOS 82 98  --   BILITOT 0.27 0.4  --    ASSESSMENT AND PLAN:  #1 severe leukopenia  #2 recent neutropenic fever,resolved  Observation is recommended. Patient is on IV broad spectrum  antibiotics and antifungals. Urine Culture negative. Blood Cultures negative to date. Appreciate Infectious Diseases consultation on 07/04/2013. Per ID note, the only long-term solution to pancytopenia/agranulocytosis and lymphadenopathy is cessation of cocaine abuse (Levamisole ingredirent). Extensive counseling on importance in attendance of cocaine is strongly recommended. Patient reports that she is ready to discontinue cocaine use as she is now aware of all the risks involved in using the drug. Previously, she had no response to G-CSF. Her most recent white blood cell differential showed some mild monocytosis, which could be a  sign of early recovery of granulocytes. Continue G-CSF 480 mcg daily (day 2) until [place] is greater than 1500. Continue on Prednisone 60 mg daily.  #3 Anemia  Due to acute on chronic disease. Her H/H is lower than in prior labs, in the setting of polypharmacy, IV fluids but no bleeding issues are noted. No transfusion is recommended at this time.   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 In addition, as mentioned above the use of cocaine (levamisole ingredient) is involved in her lymphadenopathy as stated by ID note.  #5 History of syphilis with genital lesion  Infectious disease to see patient while in the hospital. Previously noted syphilis lesion on the buttock area has resolved per Dr. Calton Dach examination.  Will continue to follow on the patient.   If her blood count remains stable tomorrow, I recommend discharge home and I will follow her in the outpatient next week as scheduled.   Rondel Jumbo, PA-C 07/05/2013, 7:37 AM Heath Lark, MD 07/05/2013

## 2013-07-05 NOTE — Progress Notes (Signed)
[facility]                                                                              Patient Demographics  Morgan Bean, is a 48 y.o. female, DOB - [birth year], OMB:[id number]  Admit date - 07/03/2013   Admitting Physician Haydee Monica, MD  Outpatient Primary MD for the patient is Jeanann Lewandowsky, MD  LOS - 2   Chief Complaint  Patient presents with  . Fatigue      Interim history 48 year old female with history of polysubstance abuse including cocaine, coronary artery disease, diastolic CHF, recent hospitalization for neutropenic fever presents again from her primary care physician's office for fever with neutropenia. Patient has been followed by infectious disease as well as hematology as an outpatient. She was recently admitted and discharged approximately 3 weeks ago and was started on prednisone by hematology. Patient supposedly also had a history of syphilis, was treated on doxycycline, however pancytopenia worsened. Currently blood cultures are pending, urinary analysis as well as chest x-ray is also negative. Infectious disease as well as hematology consulted.  Assessment & Plan   Recurrent neutropenic fever -White count holding steady -Hematology  and ID consulted and appreciated -Continue empiric antibiotics with vancomycin, cefepime as well as Diflucan -Chest x-ray negative, urinalysis negative, blood cultures thus far negative -Continue prednisone -Patient has had extensive workup in the past including bone marrow biopsies and lymph node biopsy -Possibly related to Levamisole in cocaine  Depression -Continue Celexa and Wellbutrin  Hypertension -Stable, Continue clonidine  Chronic diastolic heart failure -Currently none overload -Diuretics held due to soft blood pressure -Last echocardiogram in April 2004 changes of 65% with left ventricular diastolic dysfunction -Will monitoring daily weights, intake and output, and place patient on fluid  restriction  Polysubstance abuse including cocaine  -Tox screen was negative   Pancytopenia, chronic -White blood count stable, hemoglobin and hematocrit currently stable, platelets stable  Code Status: Full  Family Communication: None at bedside.  Disposition Plan: Admitted  Time Spent in minutes   30 minutes  Procedures  None  Consults   Hematology/Oncology Infectious Disease  DVT Prophylaxis  SCDs  Lab Results  Component Value Date   PLT 180 07/05/2013    Medications  Scheduled Meds: . buPROPion  100 mg Oral BID  . ceFEPime (MAXIPIME) IV  2 g Intravenous Q8H  . citalopram  40 mg Oral Daily  . cloNIDine  0.3 mg Oral BID  . filgrastim (NEUPOGEN)  SQ  480 mcg Subcutaneous q1800  . fluconazole  100 mg Oral Daily  . loratadine  10 mg Oral Daily  . multivitamin with minerals  1 tablet Oral Daily  . predniSONE  60 mg Oral Q breakfast  . sodium chloride  3 mL Intravenous Q12H  . traZODone  100 mg Oral QHS  . vancomycin  1,250 mg Intravenous Q12H   Continuous Infusions:  PRN Meds:.sodium chloride, acetaminophen, acetaminophen, albuterol, hydrOXYzine, polyethylene glycol, sodium chloride  Antibiotics    Anti-infectives   Start     Dose/Rate Route Frequency Ordered Stop   07/04/13 1000  fluconazole (DIFLUCAN) tablet 100 mg     100 mg Oral Daily 07/04/13 0052  07/04/13 0600  vancomycin (VANCOCIN) 1,250 mg in sodium chloride 0.9 % 250 mL IVPB     1,250 mg 166.7 mL/hr over 90 Minutes Intravenous Every 12 hours 07/04/13 0113     07/04/13 0115  vancomycin (VANCOCIN) 1,250 mg in sodium chloride 0.9 % 250 mL IVPB  Status:  Discontinued     1,250 mg 166.7 mL/hr over 90 Minutes Intravenous Every 12 hours 07/04/13 0112 07/04/13 0113   07/03/13 2000  ceFEPIme (MAXIPIME) 2 g in dextrose 5 % 50 mL IVPB     2 g 100 mL/hr over 30 Minutes Intravenous Every 8 hours 07/03/13 1936     07/03/13 1930  vancomycin (VANCOCIN) IVPB 1000 mg/200 mL premix     1,000 mg 200 mL/hr over  60 Minutes Intravenous  Once 07/03/13 1917 07/03/13 2129        Subjective:   [name] seen and examined today.  No acute events noted overnight.  Objective:   Filed Vitals:   07/04/13 1500 07/04/13 2120 07/05/13 0329 07/05/13 0548  BP: 118/81 110/76  132/96  Pulse: 70 72  78  Temp: 97.9 F (36.6 C) 97.8 F (36.6 C)  97.6 F (36.4 C)  TempSrc: Oral Oral  Oral  Resp: 19 18  18   Height:      Weight:   82.736 kg (182 lb 6.4 oz)   SpO2: 100% 100%  100%    Wt Readings from Last 3 Encounters:  07/05/13 82.736 kg (182 lb 6.4 oz)  07/03/13 81.194 kg (179 lb)  06/30/13 79.107 kg (174 lb 6.4 oz)     Intake/Output Summary (Last 24 hours) at 07/05/13 0817 Last data filed at 07/05/13 0805  Gross per 24 hour  Intake    120 ml  Output   1500 ml  Net  -1380 ml    Exam  General: Well developed, well nourished, NAD, appears stated age  HEENT: NCAT, PERRLA, EOMI, Anicteic Sclera, mucous membranes moist. poor dentition.   Neck: Supple, no JVD, no masses, no cervical lymphadenopathy   Cardiovascular: S1 S2 auscultated, no rubs, murmurs or gallops. Regular rate and rhythm.  Respiratory: Clear to auscultation bilaterally with equal chest rise  Abdomen: Soft, nontender, nondistended, + bowel sounds  Extremities: warm dry without cyanosis clubbing or edema  Neuro: AAOx3, cranial nerves grossly intact. Strength 5/5 in patient's upper and lower extremities bilaterally  Skin: Without rashes exudates or nodules  Psych: Normal affect and demeanor with intact judgement and insight  Data Review   Micro Results Recent Results (from the past 240 hour(s))  CULTURE, BLOOD (ROUTINE X 2)     Status: None   Collection Time    07/03/13  7:00 PM      Result Value Ref Range Status   Specimen Description BLOOD ARM RIGHT   Final   Special Requests BOTTLES DRAWN AEROBIC AND ANAEROBIC 5CC   Final   Culture  Setup Time     Final   Value: 07/04/2013 01:09     Performed at [facility]   Culture     Final   Value:        BLOOD CULTURE RECEIVED NO GROWTH TO DATE CULTURE WILL BE HELD FOR 5 DAYS BEFORE ISSUING A FINAL NEGATIVE REPORT     Performed at [facility]   Report Status PENDING   Incomplete  CULTURE, BLOOD (ROUTINE X 2)     Status: None   Collection Time    07/03/13  7:21 PM  Result Value Ref Range Status   Specimen Description BLOOD ARM LEFT   Final   Special Requests BOTTLES DRAWN AEROBIC AND ANAEROBIC 10CC   Final   Culture  Setup Time     Final   Value: 07/04/2013 01:09     Performed at [facility]   Culture     Final   Value:        BLOOD CULTURE RECEIVED NO GROWTH TO DATE CULTURE WILL BE HELD FOR 5 DAYS BEFORE ISSUING A FINAL NEGATIVE REPORT     Performed at [facility]   Report Status PENDING   Incomplete  URINE CULTURE     Status: None   Collection Time    07/03/13  9:20 PM      Result Value Ref Range Status   Specimen Description URINE, CLEAN CATCH   Final   Special Requests NONE   Final   Culture  Setup Time     Final   Value: 07/03/2013 22:16     Performed at [place]     Final   Value: NO GROWTH     Performed at [facility]   Culture     Final   Value: NO GROWTH     Performed at [facility]   Report Status 07/04/2013 FINAL   Final    Radiology Reports Ct Angio Chest Pe W/cm &/or Wo Cm  06/08/2013   CLINICAL DATA:  Chest pain  EXAM: CT ANGIOGRAPHY CHEST WITH CONTRAST  TECHNIQUE: Multidetector CT imaging of the chest was performed using the standard protocol during bolus administration of intravenous contrast. Multiplanar CT image reconstructions and MIPs were obtained to evaluate the vascular anatomy.  CONTRAST:  115mL OMNIPAQUE IOHEXOL 350 MG/ML SOLN  COMPARISON:  Chest CT April 16, 2013 and chest radiograph June 08, 2013  FINDINGS: There is no demonstrable pulmonary embolus. There is no thoracic aortic aneurysm or dissection.  There is focal atelectatic  change in the posterior left base. Mild scarring in both lung bases is also noted. There is no frank edema or consolidation.  There is no appreciable thoracic adenopathy. Pericardium is not thickened. There is a small hiatal hernia.  Visualized upper abdominal structures appear normal. There are no blastic or lytic bone lesions. Visualized thyroid appears normal.  Review of the MIP images confirms the above findings.  IMPRESSION: Left base atelectatic change. No demonstrable pulmonary embolus. Small hiatal hernia.   Electronically Signed   By: Lowella Grip M.D.   On: 06/08/2013 10:28   Ct Biopsy  06/07/2013   CLINICAL DATA:  48 year old with pancytopenia. Tissue cultures have been requested.  EXAM: CT GUIDED BONE MARROW ASPIRATES AND BIOPSY  Physician: Stephan Minister. Anselm Pancoast, MD  MEDICATIONS: 4 mg versed, 200 mcg fentanyl. A radiology nurse monitored the patient for moderate sedation.  ANESTHESIA/SEDATION: Sedation time: 30 min  PROCEDURE: The procedure was explained to the patient. The risks and benefits of the procedure were discussed and the patient's questions were addressed. Informed consent was obtained from the patient. The patient was placed prone on CT scan. Images of the pelvis were obtained. The right side of back was prepped and draped in sterile fashion. The skin and right posterior iliac bone were anesthetized with 1% lidocaine. 11 gauge bone needle was directed into the right iliac bone with CT guidance. Two aspirates and 2 core biopsies obtained. One core biopsy was sent for microbiology.  FINDINGS: Needle placement in the right  posterior iliac bone. Again noted is an enlarged and multi-lobulated uterus with scattered calcifications. Findings are consistent with a fibroid uterus. Limited evaluation of the adnexal tissue.  COMPLICATIONS: None  IMPRESSION: CT guided bone marrow aspirates and core biopsy.   Electronically Signed   By: Markus Daft M.D.   On: 06/07/2013 17:44   Dg Chest Port 1  View  07/03/2013   CLINICAL DATA:  Fatigue  EXAM: PORTABLE CHEST - 1 VIEW  COMPARISON:  06/08/2013  FINDINGS: Cardiac shadow remains enlarged. The lungs are clear. No bony abnormality is seen. No gross soft tissue abnormality is noted.  IMPRESSION: No active disease.   Electronically Signed   By: Inez Catalina M.D.   On: 07/03/2013 19:17   Dg Chest Port 1 View  06/08/2013   CLINICAL DATA:  Acute onset chest pain.  EXAM: PORTABLE CHEST - 1 VIEW  COMPARISON:  PA and lateral chest 04/16/2013.  CT chest 04/16/2013.  FINDINGS: Heart size is mildly enlarged. The lungs are clear. No pneumothorax or pleural effusion. No focal bony abnormality.  IMPRESSION: Mild cardiomegaly without acute disease.   Electronically Signed   By: Inge Rise M.D.   On: 06/08/2013 03:53    CBC  Recent Labs Lab 06/30/13 0816 07/03/13 1837 07/04/13 0530 07/05/13 0346  WBC 3.0* 1.3* 2.0* 2.0*  HGB 10.2* 8.6* 8.4* 8.8*  HCT 32.0* 27.3* 27.2* 27.4*  PLT 247 200 181 180  MCV 75.5* 77.3* 76.6* 75.9*  MCH 24.0* 24.4* 23.7* 24.4*  MCHC 31.8 31.5 30.9 32.1  RDW 22.4* 20.9* 20.5* 20.7*  LYMPHSABS 1.5 0.8 1.3 1.1  MONOABS 1.4* 0.4 0.7 0.8  EOSABS 0.0 0.0 0.0 0.0  BASOSABS 0.0 0.0 0.0 0.0    Chemistries   Recent Labs Lab 06/30/13 0817 07/03/13 1837 07/04/13 0530  NA 139 137 136*  K 3.8 4.2 4.1  CL  --  105 104  CO2 25 17* 19  GLUCOSE 121 108* 197*  BUN 14.9 10 11   CREATININE 1.0 0.64 0.56  CALCIUM 9.4 8.9 8.7  AST 9 46*  --   ALT 11 32  --   ALKPHOS 82 98  --   BILITOT 0.27 0.4  --    ------------------------------------------------------------------------------------------------------------------ estimated creatinine clearance is 91.2 ml/min (by C-G formula based on Cr of 0.56). ------------------------------------------------------------------------------------------------------------------ No results found for this basename: HGBA1C,  in the last 72  hours ------------------------------------------------------------------------------------------------------------------ No results found for this basename: CHOL, HDL, LDLCALC, TRIG, CHOLHDL, LDLDIRECT,  in the last 72 hours ------------------------------------------------------------------------------------------------------------------ No results found for this basename: TSH, T4TOTAL, FREET3, T3FREE, THYROIDAB,  in the last 72 hours ------------------------------------------------------------------------------------------------------------------  Recent Labs  07/03/13 1837  RETICCTPCT 2.5    Coagulation profile No results found for this basename: INR, PROTIME,  in the last 168 hours  No results found for this basename: DDIMER,  in the last 72 hours  Cardiac Enzymes No results found for this basename: CK, CKMB, TROPONINI, MYOGLOBIN,  in the last 168 hours ------------------------------------------------------------------------------------------------------------------ No components found with this basename: POCBNP,     Donne Hazel D.O. on 07/05/2013 at 8:17 AM  Between 7am to 7pm - Pager - [phone]  After 7pm go to [email or web address] - password [email or web address]  And look for the night coverage person covering for me after hours  [facility] Office  [phone]

## 2013-07-06 LAB — FUNGUS CULTURE W SMEAR: Fungal Smear: NONE SEEN

## 2013-07-06 NOTE — Progress Notes (Signed)
From ID standpoint, can discontinue antibiotics no need for further treatment since all cultures negative. Neutropenia thought to be due to levimasole toxicity

## 2013-07-06 NOTE — Discharge Summary (Signed)
Physician Discharge Summary  Morgan Bean YBO:[id number] DOB: [birth year] DOA: 07/03/2013  PCP: [name], MD  Admit date: 07/03/2013 Discharge date: 07/06/2013  Time spent: 35 minutes  Recommendations for Outpatient Follow-up:  1. Follow up with PCP in 1-2 weeks 2. Follow up with Dr. Alvy Bimler as scheduled 3. Cocaine cessation done at bedside  Discharge Diagnoses:  Principal Problem:   Cocaine-induced vascular disorder Active Problems:   Chronic diastolic heart failure   Cocaine abuse   Polysubstance abuse   Unspecified deficiency anemia   Neutropenic fever   Discharge Condition: Stable  Diet recommendation: Regular  Filed Weights   07/04/13 0100 07/05/13 0329 07/06/13 0500  Weight: 81.965 kg (180 lb 11.2 oz) 82.736 kg (182 lb 6.4 oz) 82.101 kg (181 lb)    History of present illness:  48 yo female with h/o crack abuse, cad, diastolic chf and recent hospitalization for neutopenic fever of unclear etiology 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 Pt has been following up with both ID and hematology. 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 Pt states that since she started the prednisone she has felt the best she has ever felt. Went for routine f/u with pcp today, and was noted to be mildly tachycardic and temp 100.4 and her wbc came back low, so was sent to ED for neutropenic fever. Pt denies any fevers (she was unaware of fever at pcp office) no cough, no rashes, No n/v/d. No cp. No sob. No abd pain. Pt has not smoked any crack cocaine in over 2 weeks. She denies any ivda.  Hospital Course:  Recurrent neutropenic fever  -White count holding steady  -Hematology and ID consulted and appreciated  -Pt was initially continued on empiric antibiotics with vancomycin, cefepime as well as Diflucan. Thus far pan cultures are all neg, including cxr -Continued prednisone   -Patient has had extensive workup in the past including bone marrow biopsies and lymph node biopsy  -Possibly related to Levamisole in cocaine  - Per oncology, the patient was given G-CSF with plans to follow up closely as an outpatient. -As pan cultures are negative per above, would not discharge home with antibiotics. The patient has remained afebrile towards the day of discharge -Per Hematology, OK to d/c after dose of G-CSF that was given on 07/05/13 Depression  -Continued Celexa and Wellbutrin  Hypertension  -Stable, Continue clonidine  Chronic diastolic heart failure  -Currently none overload  -Diuretics temporarily held due to soft blood pressure  -Last echocardiogram in April 2004 changes of 65% with left ventricular diastolic dysfunction   Polysubstance abuse including cocaine  -Tox screen was negative  Pancytopenia, chronic  -White blood count stable, hemoglobin and hematocrit currently stable, platelets stable  Consultations:  Hematology  ID  Discharge Exam: Filed Vitals:   07/05/13 2145 07/06/13 0500 07/06/13 0547 07/06/13 1420  BP: 124/88  140/89 133/97  Pulse: 71  84 85  Temp: 97.2 F (36.2 C)  98.4 F (36.9 C) 97.3 F (36.3 C)  TempSrc: Oral  Oral Oral  Resp: 18  18 19   Height:      Weight:  82.101 kg (181 lb)    SpO2: 100%  98% 100%    General: Awake, in nad Cardiovascular: regular, s1, s2 Respiratory: normal resp effort, no wheezing  Discharge Instructions       Future Appointments Provider Department Dept Phone   07/13/2013 8:30 AM [facility] [phone]   07/13/2013 9:00 AM Heath Lark, MD [facility] [phone]   07/28/2013 9:15 AM [name], MD [facility] [phone]   07/31/2013 3:00 PM Truman Hayward, MD [facility] [phone]       Medication List         albuterol 108 (90 BASE)  MCG/ACT inhaler  Commonly known as:  PROVENTIL HFA;VENTOLIN HFA  Inhale into the lungs every 6 (six) hours as needed for wheezing or shortness of breath.     albuterol 108 (90 BASE) MCG/ACT inhaler  Commonly known as:  PROVENTIL HFA;VENTOLIN HFA  Inhale 2 puffs into the lungs every 4 (four) hours as needed for wheezing or shortness of breath.     amLODipine 10 MG tablet  Commonly known as:  NORVASC  TAKE ONE TABLET DAILY     aspirin 81 MG tablet  Take 81 mg by mouth daily.     buPROPion 100 MG tablet  Commonly known as:  WELLBUTRIN  Take 100 mg by mouth 2 (two) times daily.     citalopram 40 MG tablet  Commonly known as:  CELEXA  Take 40 mg by mouth daily.     cloNIDine 0.3 MG tablet  Commonly known as:  CATAPRES  Take 0.3 mg by mouth 2 (two) times daily.     fluconazole 100 MG tablet  Commonly known as:  DIFLUCAN  Take 1 tablet (100 mg total) by mouth daily.     furosemide 20 MG tablet  Commonly known as:  LASIX  Take 1 tablet (20 mg total) by mouth 2 (two) times daily.     hydrOXYzine 25 MG tablet  Commonly known as:  ATARAX/VISTARIL  Take 25 mg by mouth at bedtime as needed (sleep).     loratadine 10 MG tablet  Commonly known as:  CLARITIN  Take 1 tablet (10 mg total) by mouth daily.     multivitamin with minerals Tabs tablet  Take 1 tablet by mouth daily.     omeprazole 40 MG capsule  Commonly known as:  PRILOSEC  Take 40 mg by mouth daily.     polyethylene glycol powder powder  Commonly known as:  GLYCOLAX/MIRALAX  Take 17 g by mouth daily as needed (constipation).     potassium chloride 10 MEQ tablet  Commonly known as:  K-DUR,KLOR-CON  Take 1 tablet (10 mEq total) by mouth 2 (two) times daily with a meal.     predniSONE 20 MG tablet  Commonly known as:  DELTASONE  Take 3 tablets (60 mg total) by mouth daily with breakfast.     traZODone 100 MG tablet  Commonly known as:  DESYREL  Take 100 mg by mouth at bedtime.       Allergies  Allergen  Reactions  . Ciprofloxacin Itching  . Lipitor [Atorvastatin] Swelling   Follow-up Information   Follow up with [name], OLUGBEMIGA, MD. Schedule an appointment as soon as possible for a visit in 1 week.   Specialty:  Internal Medicine   Contact information:   [place] [address] [phone]       Follow up with [facility], NI, MD. (as scheduled)    Specialty:  Hematology and Oncology   Contact information:   [place] [address] [phone]        The results of significant diagnostics from this  hospitalization (including imaging, microbiology, ancillary and laboratory) are listed below for reference.    Significant Diagnostic Studies: Ct Angio Chest Pe W/cm &/or Wo Cm  06/08/2013   CLINICAL DATA:  Chest pain  EXAM: CT ANGIOGRAPHY CHEST WITH CONTRAST  TECHNIQUE: Multidetector CT imaging of the chest was performed using the standard protocol during bolus administration of intravenous contrast. Multiplanar CT image reconstructions and MIPs were obtained to evaluate the vascular anatomy.  CONTRAST:  171mL OMNIPAQUE IOHEXOL 350 MG/ML SOLN  COMPARISON:  Chest CT April 16, 2013 and chest radiograph June 08, 2013  FINDINGS: There is no demonstrable pulmonary embolus. There is no thoracic aortic aneurysm or dissection.  There is focal atelectatic change in the posterior left base. Mild scarring in both lung bases is also noted. There is no frank edema or consolidation.  There is no appreciable thoracic adenopathy. Pericardium is not thickened. There is a small hiatal hernia.  Visualized upper abdominal structures appear normal. There are no blastic or lytic bone lesions. Visualized thyroid appears normal.  Review of the MIP images confirms the above findings.  IMPRESSION: Left base atelectatic change. No demonstrable pulmonary embolus. Small hiatal hernia.   Electronically Signed   By: Lowella Grip M.D.   On: 06/08/2013 10:28   Ct Biopsy  06/07/2013    CLINICAL DATA:  48 year old with pancytopenia. Tissue cultures have been requested.  EXAM: CT GUIDED BONE MARROW ASPIRATES AND BIOPSY  Physician: Stephan Minister. Anselm Pancoast, MD  MEDICATIONS: 4 mg versed, 200 mcg fentanyl. A radiology nurse monitored the patient for moderate sedation.  ANESTHESIA/SEDATION: Sedation time: 30 min  PROCEDURE: The procedure was explained to the patient. The risks and benefits of the procedure were discussed and the patient's questions were addressed. Informed consent was obtained from the patient. The patient was placed prone on CT scan. Images of the pelvis were obtained. The right side of back was prepped and draped in sterile fashion. The skin and right posterior iliac bone were anesthetized with 1% lidocaine. 11 gauge bone needle was directed into the right iliac bone with CT guidance. Two aspirates and 2 core biopsies obtained. One core biopsy was sent for microbiology.  FINDINGS: Needle placement in the right posterior iliac bone. Again noted is an enlarged and multi-lobulated uterus with scattered calcifications. Findings are consistent with a fibroid uterus. Limited evaluation of the adnexal tissue.  COMPLICATIONS: None  IMPRESSION: CT guided bone marrow aspirates and core biopsy.   Electronically Signed   By: Markus Daft M.D.   On: 06/07/2013 17:44   Dg Chest Port 1 View  07/03/2013   CLINICAL DATA:  Fatigue  EXAM: PORTABLE CHEST - 1 VIEW  COMPARISON:  06/08/2013  FINDINGS: Cardiac shadow remains enlarged. The lungs are clear. No bony abnormality is seen. No gross soft tissue abnormality is noted.  IMPRESSION: No active disease.   Electronically Signed   By: Inez Catalina M.D.   On: 07/03/2013 19:17   Dg Chest Port 1 View  06/08/2013   CLINICAL DATA:  Acute onset chest pain.  EXAM: PORTABLE CHEST - 1 VIEW  COMPARISON:  PA and lateral chest 04/16/2013.  CT chest 04/16/2013.  FINDINGS: Heart size is mildly enlarged. The lungs are clear. No pneumothorax or pleural effusion. No focal bony  abnormality.  IMPRESSION: Mild cardiomegaly without acute disease.   Electronically Signed   By: Inge Rise M.D.   On: 06/08/2013 03:53    Microbiology: Recent Results (from the past 240 hour(s))  CULTURE, BLOOD (ROUTINE X  2)     Status: None   Collection Time    07/03/13  7:00 PM      Result Value Ref Range Status   Specimen Description BLOOD ARM RIGHT   Final   Special Requests BOTTLES DRAWN AEROBIC AND ANAEROBIC 5CC   Final   Culture  Setup Time     Final   Value: 07/04/2013 01:09     Performed at [facility]   Culture     Final   Value:        BLOOD CULTURE RECEIVED NO GROWTH TO DATE CULTURE WILL BE HELD FOR 5 DAYS BEFORE ISSUING A FINAL NEGATIVE REPORT     Performed at [facility]   Report Status PENDING   Incomplete  CULTURE, BLOOD (ROUTINE X 2)     Status: None   Collection Time    07/03/13  7:21 PM      Result Value Ref Range Status   Specimen Description BLOOD ARM LEFT   Final   Special Requests BOTTLES DRAWN AEROBIC AND ANAEROBIC 10CC   Final   Culture  Setup Time     Final   Value: 07/04/2013 01:09     Performed at [facility]   Culture     Final   Value:        BLOOD CULTURE RECEIVED NO GROWTH TO DATE CULTURE WILL BE HELD FOR 5 DAYS BEFORE ISSUING A FINAL NEGATIVE REPORT     Performed at [facility]   Report Status PENDING   Incomplete  URINE CULTURE     Status: None   Collection Time    07/03/13  9:20 PM      Result Value Ref Range Status   Specimen Description URINE, CLEAN CATCH   Final   Special Requests NONE   Final   Culture  Setup Time     Final   Value: 07/03/2013 22:16     Performed at [place]     Final   Value: NO GROWTH     Performed at [facility]   Culture     Final   Value: NO GROWTH     Performed at [facility]   Report Status 07/04/2013 FINAL   Final     Labs: Basic Metabolic Panel:  Recent Labs Lab 06/30/13 0817 07/03/13 1837 07/04/13 0530  NA  139 137 136*  K 3.8 4.2 4.1  CL  --  105 104  CO2 25 17* 19  GLUCOSE 121 108* 197*  BUN 14.9 10 11   CREATININE 1.0 0.64 0.56  CALCIUM 9.4 8.9 8.7   Liver Function Tests:  Recent Labs Lab 06/30/13 0817 07/03/13 1837  AST 9 46*  ALT 11 32  ALKPHOS 82 98  BILITOT 0.27 0.4  PROT 8.4* 8.5*  ALBUMIN 2.8* 2.6*   No results found for this basename: LIPASE, AMYLASE,  in the last 168 hours No results found for this basename: AMMONIA,  in the last 168 hours CBC:  Recent Labs Lab 06/30/13 0816 07/03/13 1837 07/04/13 0530 07/05/13 0346  WBC 3.0* 1.3* 2.0* 2.0*  NEUTROABS 0.0* 0.1* 0.0* 0.1*  HGB 10.2* 8.6* 8.4* 8.8*  HCT 32.0* 27.3* 27.2* 27.4*  MCV 75.5* 77.3* 76.6* 75.9*  PLT 247 200 181 180   Cardiac Enzymes: No results found for this basename: CKTOTAL, CKMB, CKMBINDEX, TROPONINI,  in the last 168 hours BNP: BNP (last 3 results)  Recent Labs  04/16/13  1616 05/02/13 1010 06/02/13 1800  PROBNP 415.6* 132.0* 500.8*   CBG: No results found for this basename: GLUCAP,  in the last 168 hours     Signed:  Donne Hazel  [facility] 07/06/2013, 3:20 PM

## 2013-07-07 NOTE — Progress Notes (Signed)
Patient ID: Morgan Bean, female   DOB: 1965/06/12, 48 y.o.   MRN: [id number]   Morgan Bean, is a 48 y.o. female  WFU:[id number]  KGU:[id number]  DOB - 10/14/1965  Chief Complaint  Patient presents with  . Follow-up        Subjective:   Morgan Bean is a 48 y.o. female here today for a follow up visit. 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 She's been followed with oncologist and infectious disease here today for followup. She has no complaint but found to have high temperature 100.87F as well as tachycardia. Her neutrophil count is 0% from 3 days ago.  Patient has No headache, No chest pain, No abdominal pain - No Nausea, No new weakness tingling or numbness, No Cough - SOB.  No problems updated.  ALLERGIES: Allergies  Allergen Reactions  . Ciprofloxacin Itching  . Lipitor [Atorvastatin] Swelling    PAST MEDICAL HISTORY: Past Medical History  Diagnosis Date  . Coronary artery disease     Lexiscan myoview (10/12) with significant ST depression upon Lexiscan injection but no ischemia or infarction on perfusion images. Left heart cath (10/12): 30% mid LAD, 30% ostial D1, 50% ostial D2.    [name] Hypertension   . Tricuspid regurgitation   . Iron deficiency     hx of  . Polysubstance abuse     Prior cocaine  . Tobacco abuse   . Leukocytoclastic vasculitis     Rash across lower body, occurred in 9/11, diagnosed by skin biopsy. ANA positive. Thought to be secondary to cocaine use.  . Pulmonary HTN     Echo (9/11) with EF 65%, mild LVH, mild AI, mild MR, moderate to possibly severe TR with PA systolic pressure 52 mmHg. Echo (10/12): severe LV hypertrophy, EF 60-65%, mild MR, mild AI, moderate to severe tricuspid regurgitation, PA  systolic pressure 38 mmHg.  Echo 4/14: moderate LVH, EF 65%, normal wall motion, diastolic dysfunction, mild AI, mild MR  . Asthma   . Shortness of breath     a. PFTs 5/14:  FEV1/FVC 99% predicted; FVC 60% predicted; DLCO mildly reduced, mild restriction, air trapping.;  b.  seen by pulmo (Dr. [name]) 5/14: mainly upper airway symptoms - ACE d/c'd (sx's better)  . Chronic diastolic CHF (congestive heart failure)   . Constipation   . Lymphadenopathy 03/21/2013  . Leukopenia 03/21/2013  . Unspecified deficiency anemia 03/29/2013  . Anginal pain     none in past year  . Anxiety   . Mental disorder   . Depression   . Stroke 2010ish  . Arthritis   . Pancytopenia   . Hyperlipemia   . Syphilis 04/25/2013  . Candidiasis 06/23/2013    MEDICATIONS AT HOME: Prior to Admission medications   Medication Sig Start Date End Date Taking? Authorizing Provider  amLODipine (NORVASC) 10 MG tablet TAKE ONE TABLET DAILY   Yes [name], MD  aspirin 81 MG tablet Take 81 mg by mouth daily.   Yes Historical Provider, MD  buPROPion (WELLBUTRIN) 100 MG tablet Take 100 mg by mouth 2 (two) times daily. 03/22/13  Yes Historical Provider, MD  citalopram (CELEXA) 40 MG tablet Take 40 mg by mouth daily. 06/15/12  Yes Theodis Blaze, MD  fluconazole (DIFLUCAN) 100 MG tablet Take 1 tablet (  100 mg total) by mouth daily. 06/23/13  Yes Heath Lark, MD  furosemide (LASIX) 20 MG tablet Take 1 tablet (20 mg total) by mouth 2 (two) times daily. 07/13/12  Yes Liliane Shi, PA-C  hydrOXYzine (ATARAX/VISTARIL) 25 MG tablet Take 25 mg by mouth at bedtime as needed (sleep).   Yes Historical Provider, MD  loratadine (CLARITIN) 10 MG tablet Take 1 tablet (10 mg total) by mouth daily. 10/31/12  Yes Shanker Kristeen Mans, MD  Multiple Vitamin (MULTIVITAMIN WITH MINERALS) TABS Take 1 tablet by mouth daily. 11/18/11  Yes Milana Huntsman Readling, MD  omeprazole (PRILOSEC) 40 MG capsule Take 40 mg by mouth daily.   Yes Historical Provider, MD    polyethylene glycol powder (GLYCOLAX/MIRALAX) powder Take 17 g by mouth daily as needed (constipation).    Yes Historical Provider, MD  potassium chloride (K-DUR,KLOR-CON) 10 MEQ tablet Take 1 tablet (10 mEq total) by mouth 2 (two) times daily with a meal. 07/13/12  Yes Scott T Kathlen Mody, PA-C  predniSONE (DELTASONE) 20 MG tablet Take 3 tablets (60 mg total) by mouth daily with breakfast. 06/14/13  Yes Heath Lark, MD  traZODone (DESYREL) 100 MG tablet Take 100 mg by mouth at bedtime. 06/15/12  Yes Theodis Blaze, MD  albuterol (PROVENTIL HFA;VENTOLIN HFA) 108 (90 BASE) MCG/ACT inhaler Inhale 2 puffs into the lungs every 4 (four) hours as needed for wheezing or shortness of breath. 06/15/12 06/15/13  Theodis Blaze, MD  albuterol (PROVENTIL HFA;VENTOLIN HFA) 108 (90 BASE) MCG/ACT inhaler Inhale into the lungs every 6 (six) hours as needed for wheezing or shortness of breath.    Historical Provider, MD  cloNIDine (CATAPRES) 0.3 MG tablet Take 0.3 mg by mouth 2 (two) times daily. 07/13/12 07/13/13  Liliane Shi, PA-C     Objective:   Filed Vitals:   07/03/13 1657  BP: 104/74  Pulse: 107  Temp: 100.4 F (38 C)  TempSrc: Oral  Resp: 16  Height: 5\' 6"  (1.676 m)  Weight: 179 lb (81.194 kg)  SpO2: 98%    Exam General appearance : Awake, alert, not in any distress. Speech Clear, pale, chronically ill-looking HEENT: Atraumatic and Normocephalic, pupils equally reactive to light and accomodation Neck: supple, no JVD. No cervical lymphadenopathy.  Chest:Good air entry bilaterally, no added sounds  CVS: S1 S2 regular, no murmurs.  Abdomen: Bowel sounds present, Non tender and not distended with no gaurding, rigidity or rebound. Extremities: B/L Lower Ext shows no edema, both legs are warm to touch Neurology: Awake alert, and oriented X 3, CN II-XII intact, Non focal Skin:No Rash Wounds:N/A  Data Review Lab Results  Component Value Date   HGBA1C 5.5 04/06/2012   HGBA1C  Value: 5.7 (NOTE)                                                                        According to the ADA Clinical Practice Recommendations for 2011, when HbA1c is used as a screening test:   >=6.5%   Diagnostic of Diabetes Mellitus           (if abnormal result  is confirmed)  5.7-6.4%   Increased risk of developing Diabetes Mellitus  References:Diagnosis and Classification of Diabetes Mellitus,Diabetes Care,2011,34(Suppl 1):S62-S69 and Standards  of Medical Care in         Diabetes - 2011,Diabetes Care,2011,34  (Suppl 1):S11-S61.* 12/02/2009   HGBA1C  Value: 5.4 (NOTE)   The ADA recommends the following therapeutic goal for glycemic   control related to Hgb A1C measurement:   Goal of Therapy:   < 7.0% Hgb A1C   Reference: American Diabetes Association: Clinical Practice   Recommendations 2008, Diabetes Care,  2008, 31:(Suppl 1). 10/19/2007     Assessment & Plan   1. Fever with tachycardia  2. Neutropenia  For neutropenic fever, I will send patient to the ER for further workup and management though she was recently treated as she has no significant complaint today, but sending patient home with fever and neutrophil count of 0 is not an option.   Return in about 2 weeks (around 07/17/2013), or if symptoms worsen or fail to improve, for Heart Failure and Hypertension.  The patient was given clear instructions to go to ER or return to medical center if symptoms don't improve, worsen or new problems develop. The patient verbalized understanding. The patient was told to call to get lab results if they haven't heard anything in the next week.   This note has been created with Surveyor, quantity. Any transcriptional errors are unintentional.    [name], MD, [place], [place], [place] and [facility] [place], [place]   07/07/2013, 2:32 PM

## 2013-07-08 ENCOUNTER — Other Ambulatory Visit: Payer: Self-pay | Admitting: Internal Medicine

## 2013-07-08 ENCOUNTER — Other Ambulatory Visit: Payer: Self-pay | Admitting: Physician Assistant

## 2013-07-10 ENCOUNTER — Ambulatory Visit: Payer: No Typology Code available for payment source | Admitting: Internal Medicine

## 2013-07-10 ENCOUNTER — Other Ambulatory Visit: Payer: Self-pay

## 2013-07-10 LAB — CULTURE, BLOOD (ROUTINE X 2)
Culture: NO GROWTH
Culture: NO GROWTH

## 2013-07-10 MED ORDER — CLONIDINE HCL 0.3 MG PO TABS: 0.3000 mg | ORAL_TABLET | Freq: Two times a day (BID) | ORAL | Status: DC

## 2013-07-13 ENCOUNTER — Ambulatory Visit (HOSPITAL_BASED_OUTPATIENT_CLINIC_OR_DEPARTMENT_OTHER): Payer: No Typology Code available for payment source | Admitting: Hematology and Oncology

## 2013-07-13 ENCOUNTER — Other Ambulatory Visit (HOSPITAL_BASED_OUTPATIENT_CLINIC_OR_DEPARTMENT_OTHER): Payer: No Typology Code available for payment source

## 2013-07-13 ENCOUNTER — Telehealth: Payer: Self-pay | Admitting: Hematology and Oncology

## 2013-07-13 VITALS — BP 104/74 | HR 80 | Temp 97.9°F | Resp 18 | Ht 66.0 in | Wt 187.7 lb

## 2013-07-13 DIAGNOSIS — D72819 Decreased white blood cell count, unspecified: Secondary | ICD-10-CM

## 2013-07-13 DIAGNOSIS — D539 Nutritional anemia, unspecified: Secondary | ICD-10-CM

## 2013-07-13 DIAGNOSIS — D649 Anemia, unspecified: Secondary | ICD-10-CM

## 2013-07-13 DIAGNOSIS — R591 Generalized enlarged lymph nodes: Secondary | ICD-10-CM

## 2013-07-13 LAB — CBC & DIFF AND RETIC
BASO%: 0.3 % (ref 0.0–2.0)
Basophils Absolute: 0 10*3/uL (ref 0.0–0.1)
EOS ABS: 0.1 10*3/uL (ref 0.0–0.5)
EOS%: 1.6 % (ref 0.0–7.0)
HCT: 30.6 % — ABNORMAL LOW (ref 34.8–46.6)
HEMOGLOBIN: 9.3 g/dL — AB (ref 11.6–15.9)
Immature Retic Fract: 10.6 % — ABNORMAL HIGH (ref 1.60–10.00)
LYMPH%: 54.3 % — AB (ref 14.0–49.7)
MCH: 23.7 pg — ABNORMAL LOW (ref 25.1–34.0)
MCHC: 30.4 g/dL — ABNORMAL LOW (ref 31.5–36.0)
MCV: 78.1 fL — ABNORMAL LOW (ref 79.5–101.0)
MONO#: 0.4 10*3/uL (ref 0.1–0.9)
MONO%: 11.2 % (ref 0.0–14.0)
NEUT#: 1 10*3/uL — ABNORMAL LOW (ref 1.5–6.5)
NEUT%: 32.6 % — ABNORMAL LOW (ref 38.4–76.8)
PLATELETS: 157 10*3/uL (ref 145–400)
RBC: 3.92 10*6/uL (ref 3.70–5.45)
RDW: 21.7 % — ABNORMAL HIGH (ref 11.2–14.5)
Retic %: 3.15 % — ABNORMAL HIGH (ref 0.70–2.10)
Retic Ct Abs: 123.48 10*3/uL — ABNORMAL HIGH (ref 33.70–90.70)
WBC: 3.1 10*3/uL — AB (ref 3.9–10.3)
lymph#: 1.7 10*3/uL (ref 0.9–3.3)

## 2013-07-13 LAB — COMPREHENSIVE METABOLIC PANEL (CC13)
ALBUMIN: 2.8 g/dL — AB (ref 3.5–5.0)
ALK PHOS: 83 U/L (ref 40–150)
ALT: 35 U/L (ref 0–55)
AST: 19 U/L (ref 5–34)
Anion Gap: 10 mEq/L (ref 3–11)
BUN: 20 mg/dL (ref 7.0–26.0)
CO2: 27 mEq/L (ref 22–29)
Calcium: 9.5 mg/dL (ref 8.4–10.4)
Chloride: 102 mEq/L (ref 98–109)
Creatinine: 0.8 mg/dL (ref 0.6–1.1)
GLUCOSE: 117 mg/dL (ref 70–140)
POTASSIUM: 4 meq/L (ref 3.5–5.1)
SODIUM: 140 meq/L (ref 136–145)
TOTAL PROTEIN: 7.6 g/dL (ref 6.4–8.3)
Total Bilirubin: 0.21 mg/dL (ref 0.20–1.20)

## 2013-07-13 MED ORDER — PREDNISONE 5 MG PO TABS: 5.0000 mg | ORAL_TABLET | Freq: Every day | ORAL | Status: DC

## 2013-07-13 NOTE — Telephone Encounter (Signed)
gv adn printed appt sched and avs for pt for May °

## 2013-07-13 NOTE — Progress Notes (Signed)
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 Further evaluation suspect the patient may have syphilis. She was treated with doxycycline with worsening pancytopenia. Her antibiotic treatment was discontinued. From 06/02/2013 to 06/09/2013, the patient was admitted to the hospital for further workup for neutropenic fever. She was placed on G-CSF with no response to therapy. Repeat bone marrow biopsy was done. The patient was given broad-spectrum intravenous antibiotics and subsequently discharged for further followup as an outpatient.  On 06/14/2013, I started her on prednisone 60 mg daily. She was readmitted to the hospital again for neutropenic fever. 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 INTERVAL HISTORY: Morgan Bean 48 y.o. female returns for further followup. She denies recent drug abuse. No recent fevers or chills.  I have reviewed the past medical history, past surgical history, social history and family history with the patient and they are unchanged from previous note.  ALLERGIES:  is allergic to ciprofloxacin and lipitor.  MEDICATIONS:  Current Outpatient Prescriptions  Medication Sig Dispense Refill  . albuterol (PROVENTIL HFA;VENTOLIN HFA) 108 (90 BASE) MCG/ACT inhaler Inhale into the lungs every 6 (six) hours as needed for wheezing or shortness of breath.      [name] amLODipine (NORVASC) 10 MG tablet TAKE ONE TABLET DAILY  30 tablet  1  . aspirin 81 MG tablet Take 81 mg by mouth daily.      [name] buPROPion (WELLBUTRIN) 100 MG tablet Take 100 mg by mouth 2 (two) times daily.      . citalopram (CELEXA) 40 MG tablet Take 40 mg by mouth daily.      . cloNIDine  (CATAPRES) 0.3 MG tablet Take 1 tablet (0.3 mg total) by mouth 2 (two) times daily.  60 tablet  3  . fluconazole (DIFLUCAN) 100 MG tablet Take 1 tablet (100 mg total) by mouth daily.  7 tablet  0  . furosemide (LASIX) 20 MG tablet Take 1 tablet (20 mg total) by mouth 2 (two) times daily.  60 tablet  11  . hydrOXYzine (ATARAX/VISTARIL) 25 MG tablet Take 25 mg by mouth at bedtime as needed (sleep).      . loratadine (CLARITIN) 10 MG tablet Take 1 tablet (10 mg total) by mouth daily.  30 tablet  3  . Multiple Vitamin (MULTIVITAMIN WITH MINERALS) TABS Take 1 tablet by mouth daily.      [name] omeprazole (PRILOSEC) 40 MG capsule Take 40 mg by mouth daily.      . polyethylene glycol powder (GLYCOLAX/MIRALAX) powder Take 17 g by mouth daily as needed (constipation).       . potassium chloride (K-DUR,KLOR-CON) 10 MEQ tablet Take 1 tablet (10 mEq total) by mouth 2 (two) times daily with a meal.  60 tablet  11  . predniSONE (DELTASONE) 20 MG tablet Take 3 tablets (60 mg total) by mouth daily with breakfast.  90 tablet  2  . traZODone (DESYREL) 100 MG tablet Take 100 mg by mouth at bedtime.      [name] albuterol (PROVENTIL HFA;VENTOLIN HFA) 108 (90 BASE) MCG/ACT inhaler Inhale 2 puffs into the lungs every 4 (four) hours as needed for wheezing or shortness of breath.      . predniSONE (DELTASONE) 5 MG tablet Take 1 tablet (  5 mg total) by mouth daily with breakfast.  30 tablet  1   No current facility-administered medications for this visit.     REVIEW OF SYSTEMS:   Constitutional: Denies fevers, chills or night sweats Eyes: Denies blurriness of vision Ears, nose, mouth, throat, and face: Denies mucositis or sore throat Respiratory: Denies cough, dyspnea or wheezes All other systems were reviewed with the patient and are negative.  PHYSICAL EXAMINATION: ECOG PERFORMANCE STATUS: 1 - Symptomatic but completely ambulatory  Filed Vitals:   07/13/13 0920  BP: 104/74  Pulse: 80  Temp: 97.9 F (36.6 C)  Resp: 18    Filed Weights   07/13/13 0920  Weight: 187 lb 11.2 oz (85.14 kg)    GENERAL:alert, no distress and comfortable SKIN: skin color, texture, turgor are normal, no rashes or significant lesions Musculoskeletal:no cyanosis of digits and no clubbing  NEURO: alert & oriented x 3 with fluent speech, no focal motor/sensory deficits  LABORATORY DATA:  I have reviewed the data as listed Results for orders placed in visit on 07/13/13 (from the past 48 hour(s))  CBC & DIFF AND RETIC     Status: Abnormal   Collection Time    07/13/13  9:13 AM      Result Value Ref Range   WBC 3.1 (*) 3.9 - 10.3 10e3/uL   NEUT# 1.0 (*) 1.5 - 6.5 10e3/uL   HGB 9.3 (*) 11.6 - 15.9 g/dL   HCT 30.6 (*) 34.8 - 46.6 %   Platelets 157  145 - 400 10e3/uL   MCV 78.1 (*) 79.5 - 101.0 fL   MCH 23.7 (*) 25.1 - 34.0 pg   MCHC 30.4 (*) 31.5 - 36.0 g/dL   RBC 3.92  3.70 - 5.45 10e6/uL   RDW 21.7 (*) 11.2 - 14.5 %   lymph# 1.7  0.9 - 3.3 10e3/uL   MONO# 0.4  0.1 - 0.9 10e3/uL   Eosinophils Absolute 0.1  0.0 - 0.5 10e3/uL   Basophils Absolute 0.0  0.0 - 0.1 10e3/uL   NEUT% 32.6 (*) 38.4 - 76.8 %   LYMPH% 54.3 (*) 14.0 - 49.7 %   MONO% 11.2  0.0 - 14.0 %   EOS% 1.6  0.0 - 7.0 %   BASO% 0.3  0.0 - 2.0 %   Retic % 3.15 (*) 0.70 - 2.10 %   Retic Ct Abs 123.48 (*) 33.70 - 90.70 10e3/uL   Immature Retic Fract 10.60 (*) 1.60 - 10.00 %    Lab Results  Component Value Date   WBC 3.1* 07/13/2013   HGB 9.3* 07/13/2013   HCT 30.6* 07/13/2013   MCV 78.1* 07/13/2013   PLT 157 07/13/2013    ASSESSMENT & PLAN:  #1 severe leukopenia #2 anemia #3 abnormal lymphadenopathy, resolving Her most recent bone marrow result is still negative for infection and lymphoma/leukemia The cause of her agranulocytosis was due to cocaine abuse. The patient is advised not to use drugs anymore. I will initiate prednisone taper. I will see her back in 3 weeks and by then she should be down to 5 mg of prednisone a day. The cause of anemia is  likely anemia chronic disease. I recommend observation only. #4 history of syphilis with genital lesion Those lesions are improving. She will followup with infectious disease team as an outpatient.   All questions were answered. The patient knows to call the clinic with any problems, questions or concerns. No barriers to learning was detected.  I spent 25 minutes counseling the  patient face to face. The total time spent in the appointment was 30 minutes and more than 50% was on counseling.     Heath Lark, MD 07/13/2013 9:45 AM

## 2013-07-16 LAB — AFB CULTURE, BLOOD

## 2013-07-20 LAB — AFB CULTURE WITH SMEAR (NOT AT ARMC): Acid Fast Smear: NONE SEEN

## 2013-07-26 ENCOUNTER — Other Ambulatory Visit: Payer: Self-pay | Admitting: Physician Assistant

## 2013-07-26 ENCOUNTER — Other Ambulatory Visit: Payer: Self-pay | Admitting: *Deleted

## 2013-07-28 ENCOUNTER — Ambulatory Visit (INDEPENDENT_AMBULATORY_CARE_PROVIDER_SITE_OTHER): Payer: No Typology Code available for payment source | Admitting: Cardiology

## 2013-07-28 VITALS — BP 144/100 | HR 80 | Ht 66.0 in | Wt 191.4 lb

## 2013-07-28 DIAGNOSIS — R0989 Other specified symptoms and signs involving the circulatory and respiratory systems: Secondary | ICD-10-CM

## 2013-07-28 DIAGNOSIS — E785 Hyperlipidemia, unspecified: Secondary | ICD-10-CM

## 2013-07-28 DIAGNOSIS — R0602 Shortness of breath: Secondary | ICD-10-CM

## 2013-07-28 DIAGNOSIS — R079 Chest pain, unspecified: Secondary | ICD-10-CM

## 2013-07-28 DIAGNOSIS — I272 Pulmonary hypertension, unspecified: Secondary | ICD-10-CM

## 2013-07-28 DIAGNOSIS — I5032 Chronic diastolic (congestive) heart failure: Secondary | ICD-10-CM

## 2013-07-28 DIAGNOSIS — R0609 Other forms of dyspnea: Secondary | ICD-10-CM

## 2013-07-28 DIAGNOSIS — F17200 Nicotine dependence, unspecified, uncomplicated: Secondary | ICD-10-CM

## 2013-07-28 DIAGNOSIS — I2789 Other specified pulmonary heart diseases: Secondary | ICD-10-CM

## 2013-07-28 NOTE — Patient Instructions (Signed)
Your physician recommends that you continue on your current medications as directed. Please refer to the Current Medication list given to you today.  Your physician has requested that you have an echocardiogram. Echocardiography is a painless test that uses sound waves to create images of your heart. It provides your doctor with information about the size and shape of your heart and how well your heart's chambers and valves are working. This procedure takes approximately one hour. There are no restrictions for this procedure.  Your physician recommends that you return for Lipid Panel (Fasting) on 10/27/13  Your physician wants you to follow-up in: 1 year with Dr Kendall Flack will receive a reminder letter in the mail two months in advance. If you don't receive a letter, please call our office to schedule the follow-up appointment.

## 2013-07-30 ENCOUNTER — Encounter: Payer: Self-pay | Admitting: Cardiology

## 2013-07-30 NOTE — Progress Notes (Signed)
Patient ID: Morgan Bean, female   DOB: [birth year], 48 y.o.   MRN: [id number] PCP: Dr. Doreene Burke  48 yo with history of diastolic CHF, tricuspid regurgitation, and nonobstructive CAD presents for followup.  Since I last saw her, she had an eventful year.  She developed pancytopenia probably from a cocaine contaminant.  She was hospitalized with infections in the setting of neutropenia.  She was treated for syphilis.  Blood counts are recovering.  She quit smoking in 3/15.  She is not using cocaine.    She continues to have some exertional dyspnea.  She does ok on flat ground but is short of breath with hills and stairs.  She has occasional atypical chest pain.  Interestingly, this was worse when she was off amlodipine.  BP is high today but she did not take her BP medications yet today..  She has been taking her Lasix.  Last echo showed normal EF, moderate LVH, and only mild TR (was severe in the past).  BP has been under control.    Labs (9/12): HCT 31.5, BNP 132, K 4, creatinine 1.2 Labs (10/12): LDL 142, HDL 46, K 4.2, creatinine 1.3 Labs (4/14): K 4.3, creatinine 1.1, BNP 190, LDL 97, HDL 45 Labs (3/15): LDL 106, HDL 38 Labs (4/15): K 4, creatinine 0.8, WBCs 3.1, HCT 30.6, plts 157  PMH: 1. Fe-deficiency anemia 2. HTN 3. Asthma 4. Active smoker 5. Prior cocaine 6. 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  ANA positive.  Thought to be secondary to cocaine use.   8. Pulmonary hypertension/tricuspid regurgitation: Echo (9/11) with EF 65%, mild LVH, mild AI, mild MR, moderate to possibly severe TR with PA systolic pressure 52 mmHg.  Echo (10/12): severe LV hypertrophy, EF 60-65%, mild MR, mild AI, moderate to severe tricuspid regurgitation, PA systolic pressure 38 mmHg.  RHC (10/12) with mean RA 4, PA 28/9, mean PCWP 9, CI 3.8.  Echo (4/14) with EF 65%, moderate LVH, mild AI, mild MR, mild TR.   9. Chest pain: Lexiscan myoview  (10/12) with significant ST depression upon Lexiscan injection but no ischemia or infarction on perfusion images.  Left heart cath (10/12): 30% mid LAD, 30% ostial D1, 50% ostial D2.  10. Diastolic CHF: Echo (2/02) with EF 60-65%, moderate LVH, mild AI, mild MR.  11. COPD: At most mild.  PFTs in 6/14 with no obstructive by spirometry but mild air-trapping.  Mild restriction as well.  12. Pancytopenia: Bone marrow biopsy negative for lymphoma/leukemia.  Thought to be related to an agent in cocaine.  12. H/o syphilis  SH: Quit smoking in 3/15, has 1 daughter, unemployed.  Prior cocaine abuse.    FH: Grandmother with "heart problems."  Father with 2 open heart surgeries in his late 39s.  Brother with PCI at age 64.   ROS: All systems reviewed and negative except as per HPI.   Current Outpatient Prescriptions  Medication Sig Dispense Refill  . Atorvastatin Calcium (LIPITOR PO) Take by mouth daily. Pt unsure of dosage      . albuterol (PROVENTIL HFA;VENTOLIN HFA) 108 (90 BASE) MCG/ACT inhaler Inhale 2 puffs into the lungs every 4 (four) hours as needed for wheezing or shortness of breath.      [name] albuterol (PROVENTIL HFA;VENTOLIN HFA) 108 (90 BASE) MCG/ACT inhaler Inhale into the lungs every 6 (six) hours as needed for wheezing or shortness of breath.      [name] amLODipine (NORVASC) 10 MG tablet  TAKE ONE TABLET DAILY  30 tablet  1  . aspirin 81 MG tablet Take 81 mg by mouth daily.      [name] buPROPion (WELLBUTRIN) 100 MG tablet Take 100 mg by mouth 2 (two) times daily.      . citalopram (CELEXA) 40 MG tablet Take 40 mg by mouth daily.      . cloNIDine (CATAPRES) 0.3 MG tablet Take 1 tablet (0.3 mg total) by mouth 2 (two) times daily.  60 tablet  3  . furosemide (LASIX) 40 MG tablet TAKE ONE-HALF TABLET TWICE DAILY  30 tablet  2  . hydrOXYzine (ATARAX/VISTARIL) 25 MG tablet Take 25 mg by mouth at bedtime as needed (sleep).      . loratadine (CLARITIN) 10 MG tablet Take 1 tablet (10 mg total) by mouth daily.   30 tablet  3  . Multiple Vitamin (MULTIVITAMIN WITH MINERALS) TABS Take 1 tablet by mouth daily.      [name] omeprazole (PRILOSEC) 40 MG capsule Take 40 mg by mouth daily.      . polyethylene glycol powder (GLYCOLAX/MIRALAX) powder Take 17 g by mouth daily as needed (constipation).       . potassium chloride (K-DUR,KLOR-CON) 10 MEQ tablet Take 1 tablet (10 mEq total) by mouth 2 (two) times daily with a meal.  60 tablet  11  . traZODone (DESYREL) 100 MG tablet Take 100 mg by mouth at bedtime.       No current facility-administered medications for this visit.    BP 144/100  Pulse 80  Ht _0  (1.676 m)  Wt 86.818 kg (191 lb 6.4 oz)  BMI 30.91 kg/m2  LMP 04/24/2013 General: NAD Neck: JVP 7 cm, no thyromegaly or thyroid nodule.  Lungs: Slightly distant breath sounds.   CV: Nondisplaced PMI.  Heart regular S1/S2, no S3/S4, no murmur.  No peripheral edema.  No carotid bruit.  Normal pedal pulses.  Abdomen: Soft, nontender, no hepatosplenomegaly, no distention.  Neurologic: Alert and oriented x 3.  Psych: Normal affect. Extremities: No clubbing or cyanosis.   Assessment/Plan:  1. Exertional dyspnea: She does not look volume overloaded on exam.  She continues on a stable Lasix dose for exertional dyspnea.  She had RHC in 10/12 that did not show significant pulmonary hypertension.  I am going to get a repeat echo to check on her tricuspid valve and right heart.  2. Hyperlipidemia: She is on a statin with nonobstructive coronary disease.   3. HTN: BP is high today, but she did not take her medications yet today.   4. Tricuspid regurgitation: Only mild on last echo. As above, will repeat echo to reassess.  5. Smoking: She recently quit.  I congratulated her.   [name] 07/30/2013 10:23 PM

## 2013-07-31 ENCOUNTER — Encounter: Payer: Self-pay | Admitting: Infectious Disease

## 2013-07-31 ENCOUNTER — Ambulatory Visit (INDEPENDENT_AMBULATORY_CARE_PROVIDER_SITE_OTHER): Payer: No Typology Code available for payment source | Admitting: Infectious Disease

## 2013-07-31 VITALS — BP 113/79 | HR 81 | Temp 97.9°F | Wt 187.0 lb

## 2013-07-31 DIAGNOSIS — I999 Unspecified disorder of circulatory system: Secondary | ICD-10-CM

## 2013-07-31 DIAGNOSIS — D708 Other neutropenia: Secondary | ICD-10-CM

## 2013-07-31 DIAGNOSIS — I889 Nonspecific lymphadenitis, unspecified: Secondary | ICD-10-CM

## 2013-07-31 DIAGNOSIS — A539 Syphilis, unspecified: Secondary | ICD-10-CM

## 2013-07-31 DIAGNOSIS — F1410 Cocaine abuse, uncomplicated: Secondary | ICD-10-CM

## 2013-07-31 DIAGNOSIS — D709 Neutropenia, unspecified: Secondary | ICD-10-CM

## 2013-07-31 DIAGNOSIS — F14988 Cocaine use, unspecified with other cocaine-induced disorder: Secondary | ICD-10-CM

## 2013-07-31 DIAGNOSIS — D638 Anemia in other chronic diseases classified elsewhere: Secondary | ICD-10-CM

## 2013-07-31 DIAGNOSIS — R5081 Fever presenting with conditions classified elsewhere: Secondary | ICD-10-CM

## 2013-07-31 DIAGNOSIS — D702 Other drug-induced agranulocytosis: Secondary | ICD-10-CM

## 2013-07-31 NOTE — Progress Notes (Signed)
Subjective:    Patient ID: Morgan Bean, female    DOB: May 13, 1965, 48 y.o.   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 She has history of active cocaine use and prior biopsy showing leukocytoclastic vasculitis on lesion on her shin.  Her most recent CBC showed improvement in her Co[place] now up to 1k.  Looking back I can see that she has had history of neutropenia that has fluctuated going back all the way to 2011.  She claims that she is abstaining from cocaine and has done so since November, though I suspect she has used more recently than that it is true that is last time her tox screen was +.  She has been rx for syphilis and has had low titer of 1:2.    Review of Systems  Constitutional: Negative for fever, chills, diaphoresis, activity change, appetite change and unexpected weight change.  HENT: Negative for congestion, rhinorrhea, sinus pressure, sneezing and trouble swallowing.   Eyes: Negative for photophobia and visual disturbance.  Respiratory: Negative for cough, chest tightness and shortness of breath.   Gastrointestinal: Negative for nausea, vomiting, abdominal pain, diarrhea and abdominal distention.  Genitourinary: Negative for dysuria, hematuria and flank pain.  Musculoskeletal: Negative for arthralgias, back pain, gait problem, joint swelling and myalgias.  Skin: Negative for color change, pallor, rash and wound.  Neurological: Negative for dizziness, tremors, weakness and light-headedness.  Hematological: Positive for adenopathy. Does not bruise/bleed easily.  Psychiatric/Behavioral: Negative for behavioral problems, confusion, sleep disturbance, dysphoric mood, decreased concentration and agitation.       Objective:   Physical Exam    Nursing note and vitals reviewed. Constitutional: She is oriented to person, place, and time. She appears well-developed and well-nourished. No distress.  HENT:  Head: Normocephalic and atraumatic.  Mouth/Throat: Oropharynx is clear and moist. No oropharyngeal exudate.  Eyes: Conjunctivae and EOM are normal. No scleral icterus.  Neck: Normal range of motion. Neck supple. No JVD present.    Cardiovascular: Normal rate, regular rhythm and normal heart sounds.  Exam reveals no gallop and no friction rub.   No murmur heard. Pulmonary/Chest: Effort normal and breath sounds normal. No respiratory distress. She has no wheezes. She has no rales. She exhibits no tenderness.  Abdominal: She exhibits no distension and no mass. There is no tenderness. There is no rebound and no guarding.  Musculoskeletal: She exhibits no edema and no tenderness.  Lymphadenopathy:    She has cervical adenopathy.  Neurological: She is alert and oriented to person, place, and time. She has normal reflexes. She exhibits normal muscle tone. Coordination normal.  Skin: Skin is warm and dry. She is not diaphoretic. No erythema. No pallor.  Psychiatric: She has a normal mood and affect. Her behavior is normal. Judgment and thought content normal.          Assessment & Plan:   #1 Neutropenia, anemia and cervical lymphadenopathy, oral lesions prior leg lesion with leukocytoclastic vasculitis: All felt to be due to levamisole toxicity from cocaine  --continue to stay off cocaine  #2 FUO: All seem to have occurred in context o neutropenia  #3 Syphilis: would not worry about low titer of 1:2. This can be rechecked 6 months from last titer.  #4 Genital lesion: not due to syphilis  Will followup culture data and labs   #5 HIV EIA +, was false + as RNA negative and discriminatory test negative  Rtc prn

## 2013-08-04 ENCOUNTER — Other Ambulatory Visit: Payer: No Typology Code available for payment source

## 2013-08-04 ENCOUNTER — Telehealth: Payer: Self-pay | Admitting: Hematology and Oncology

## 2013-08-04 ENCOUNTER — Ambulatory Visit (HOSPITAL_BASED_OUTPATIENT_CLINIC_OR_DEPARTMENT_OTHER): Payer: No Typology Code available for payment source | Admitting: Hematology and Oncology

## 2013-08-04 VITALS — BP 131/87 | HR 77 | Temp 98.0°F | Resp 18 | Ht 66.0 in | Wt 187.7 lb

## 2013-08-04 DIAGNOSIS — R591 Generalized enlarged lymph nodes: Secondary | ICD-10-CM

## 2013-08-04 DIAGNOSIS — D539 Nutritional anemia, unspecified: Secondary | ICD-10-CM

## 2013-08-04 DIAGNOSIS — R5081 Fever presenting with conditions classified elsewhere: Secondary | ICD-10-CM

## 2013-08-04 DIAGNOSIS — D708 Other neutropenia: Secondary | ICD-10-CM

## 2013-08-04 DIAGNOSIS — D709 Neutropenia, unspecified: Secondary | ICD-10-CM

## 2013-08-04 DIAGNOSIS — D72819 Decreased white blood cell count, unspecified: Secondary | ICD-10-CM

## 2013-08-04 DIAGNOSIS — F1410 Cocaine abuse, uncomplicated: Secondary | ICD-10-CM

## 2013-08-04 DIAGNOSIS — R599 Enlarged lymph nodes, unspecified: Secondary | ICD-10-CM

## 2013-08-04 LAB — CBC WITH DIFFERENTIAL/PLATELET
BASO%: 0.9 % (ref 0.0–2.0)
Basophils Absolute: 0 10*3/uL (ref 0.0–0.1)
EOS%: 1.9 % (ref 0.0–7.0)
Eosinophils Absolute: 0 10*3/uL (ref 0.0–0.5)
HCT: 36.9 % (ref 34.8–46.6)
HGB: 11.8 g/dL (ref 11.6–15.9)
LYMPH%: 57.5 % — AB (ref 14.0–49.7)
MCH: 25.1 pg (ref 25.1–34.0)
MCHC: 32 g/dL (ref 31.5–36.0)
MCV: 78.5 fL — ABNORMAL LOW (ref 79.5–101.0)
MONO#: 0.6 10*3/uL (ref 0.1–0.9)
MONO%: 29.7 % — AB (ref 0.0–14.0)
NEUT#: 0.2 10*3/uL — CL (ref 1.5–6.5)
NEUT%: 10 % — ABNORMAL LOW (ref 38.4–76.8)
PLATELETS: 201 10*3/uL (ref 145–400)
RBC: 4.7 10*6/uL (ref 3.70–5.45)
RDW: 20.8 % — ABNORMAL HIGH (ref 11.2–14.5)
WBC: 2.1 10*3/uL — AB (ref 3.9–10.3)
lymph#: 1.2 10*3/uL (ref 0.9–3.3)
nRBC: 0 % (ref 0–0)

## 2013-08-04 LAB — COMPREHENSIVE METABOLIC PANEL (CC13)
ALBUMIN: 3.5 g/dL (ref 3.5–5.0)
ALT: 17 U/L (ref 0–55)
ANION GAP: 11 meq/L (ref 3–11)
AST: 14 U/L (ref 5–34)
Alkaline Phosphatase: 123 U/L (ref 40–150)
BUN: 11.7 mg/dL (ref 7.0–26.0)
CALCIUM: 9.9 mg/dL (ref 8.4–10.4)
CHLORIDE: 108 meq/L (ref 98–109)
CO2: 22 meq/L (ref 22–29)
Creatinine: 0.8 mg/dL (ref 0.6–1.1)
GLUCOSE: 95 mg/dL (ref 70–140)
POTASSIUM: 4 meq/L (ref 3.5–5.1)
Sodium: 141 mEq/L (ref 136–145)
TOTAL PROTEIN: 8.5 g/dL — AB (ref 6.4–8.3)
Total Bilirubin: 0.32 mg/dL (ref 0.20–1.20)

## 2013-08-04 MED ORDER — PEGFILGRASTIM INJECTION 6 MG/0.6ML
6.0000 mg | Freq: Once | SUBCUTANEOUS | Status: AC
  Administered 2013-08-04: 6 mg via SUBCUTANEOUS
  Filled 2013-08-04: qty 0.6

## 2013-08-04 NOTE — Progress Notes (Signed)
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 Further evaluation suspect the patient may have syphilis. She was treated with doxycycline with worsening pancytopenia. Her antibiotic treatment was discontinued. From 06/02/2013 to 06/09/2013, the patient was admitted to the hospital for further workup for neutropenic fever. She was placed on G-CSF with no response to therapy. Repeat bone marrow biopsy was done. The patient was given broad-spectrum intravenous antibiotics and subsequently discharged for further followup as an outpatient.  On 06/14/2013, I started her on prednisone 60 mg daily. She was readmitted to the hospital again for neutropenic fever. 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 On 08/04/2013, I advised further prednisone taper. She is given G-CSF for severe neutropenia. INTERVAL HISTORY: Morgan Bean 48 y.o. female returns for further followup. She denies any recent infection. Denies any fevers or chills. She denies further drug abuse. Her appetite is stable and she has not lost any weight. The buttock lesion has resolved.  I have reviewed the past medical history, past surgical history, social history and family history with the patient and they are unchanged from previous note.  ALLERGIES:  is allergic to ciprofloxacin.  MEDICATIONS:  Current Outpatient Prescriptions  Medication Sig Dispense Refill  . albuterol (PROVENTIL HFA;VENTOLIN HFA) 108 (90 BASE) MCG/ACT inhaler Inhale into the lungs every 6 (six) hours as needed for wheezing or shortness of breath.      [name] amLODipine (NORVASC) 10 MG tablet TAKE ONE TABLET DAILY  30 tablet  1  . aspirin 81 MG  tablet Take 81 mg by mouth daily.      . Atorvastatin Calcium (LIPITOR PO) Take by mouth daily. Pt unsure of dosage      . buPROPion (WELLBUTRIN) 100 MG tablet Take 100 mg by mouth 2 (two) times daily.      . citalopram (CELEXA) 40 MG tablet Take 40 mg by mouth daily.      . cloNIDine (CATAPRES) 0.3 MG tablet Take 1 tablet (0.3 mg total) by mouth 2 (two) times daily.  60 tablet  3  . furosemide (LASIX) 40 MG tablet TAKE ONE-HALF TABLET TWICE DAILY  30 tablet  2  . hydrOXYzine (ATARAX/VISTARIL) 25 MG tablet Take 25 mg by mouth at bedtime as needed (sleep).      . loratadine (CLARITIN) 10 MG tablet Take 1 tablet (10 mg total) by mouth daily.  30 tablet  3  . Multiple Vitamin (MULTIVITAMIN WITH MINERALS) TABS Take 1 tablet by mouth daily.      [name] omeprazole (PRILOSEC) 40 MG capsule Take 40 mg by mouth daily.      . polyethylene glycol powder (GLYCOLAX/MIRALAX) powder Take 17 g by mouth daily as needed (constipation).       . potassium chloride (K-DUR,KLOR-CON) 10 MEQ tablet Take 1 tablet (10 mEq total) by mouth 2 (two) times daily with a meal.  60 tablet  11  . traZODone (DESYREL) 100 MG tablet Take 100 mg by mouth at bedtime.      [name] albuterol (PROVENTIL HFA;VENTOLIN HFA) 108 (90 BASE) MCG/ACT inhaler Inhale 2 puffs into the lungs every 4 (four) hours as needed for wheezing or shortness of breath.       No current facility-administered medications for this visit.  REVIEW OF SYSTEMS:   Constitutional: Denies fevers, chills or night sweats Eyes: Denies blurriness of vision Ears, nose, mouth, throat, and face: Denies mucositis or sore throat Respiratory: Denies cough, dyspnea or wheezes Cardiovascular: Denies palpitation, chest discomfort or lower extremity swelling Gastrointestinal:  Denies nausea, heartburn or change in bowel habits Skin: Denies abnormal skin rashes Lymphatics: Denies new lymphadenopathy or easy bruising Neurological:Denies numbness, tingling or new  weaknesses Behavioral/Psych: Mood is stable, no new changes  All other systems were reviewed with the patient and are negative.  PHYSICAL EXAMINATION: ECOG PERFORMANCE STATUS: 0 - Asymptomatic  Filed Vitals:   08/04/13 0828  BP: 131/87  Pulse: 77  Temp: 98 F (36.7 C)  Resp: 18   Filed Weights   08/04/13 0828  Weight: 187 lb 11.2 oz (85.14 kg)    GENERAL:alert, no distress and comfortable SKIN: skin color, texture, turgor are normal, no rashes or significant lesions. The buttock lesion has healed EYES: normal, Conjunctiva are pink and non-injected, sclera clear OROPHARYNX:no exudate, no erythema and lips, buccal mucosa, and tongue normal . Poor dentition is noted NECK: supple, thyroid normal size, non-tender, without nodularity LYMPH:  no palpable lymphadenopathy in the cervical, axillary or inguinal LUNGS: clear to auscultation and percussion with normal breathing effort HEART: regular rate & rhythm and no murmurs and no lower extremity edema ABDOMEN:abdomen soft, non-tender and normal bowel sounds Musculoskeletal:no cyanosis of digits and no clubbing  NEURO: alert & oriented x 3 with fluent speech, no focal motor/sensory deficits  LABORATORY DATA:  I have reviewed the data as listed Results for orders placed in visit on 07/13/13 (from the past 48 hour(s))  CBC WITH DIFFERENTIAL     Status: Abnormal   Collection Time    08/04/13  8:15 AM      Result Value Ref Range   WBC 2.1 (*) 3.9 - 10.3 10e3/uL   NEUT# 0.2 (*) 1.5 - 6.5 10e3/uL   HGB 11.8  11.6 - 15.9 g/dL   HCT 36.9  34.8 - 46.6 %   Platelets 201  145 - 400 10e3/uL   MCV 78.5 (*) 79.5 - 101.0 fL   MCH 25.1  25.1 - 34.0 pg   MCHC 32.0  31.5 - 36.0 g/dL   RBC 4.70  3.70 - 5.45 10e6/uL   RDW 20.8 (*) 11.2 - 14.5 %   lymph# 1.2  0.9 - 3.3 10e3/uL   MONO# 0.6  0.1 - 0.9 10e3/uL   Eosinophils Absolute 0.0  0.0 - 0.5 10e3/uL   Basophils Absolute 0.0  0.0 - 0.1 10e3/uL   NEUT% 10.0 (*) 38.4 - 76.8 %   LYMPH% 57.5  (*) 14.0 - 49.7 %   MONO% 29.7 (*) 0.0 - 14.0 %   EOS% 1.9  0.0 - 7.0 %   BASO% 0.9  0.0 - 2.0 %   nRBC 0  0 - 0 %  COMPREHENSIVE METABOLIC PANEL (SK87)     Status: Abnormal   Collection Time    08/04/13  8:15 AM      Result Value Ref Range   Sodium 141  136 - 145 mEq/L   Potassium 4.0  3.5 - 5.1 mEq/L   Chloride 108  98 - 109 mEq/L   CO2 22  22 - 29 mEq/L   Glucose 95  70 - 140 mg/dl   BUN 11.7  7.0 - 26.0 mg/dL   Creatinine 0.8  0.6 - 1.1 mg/dL   Total Bilirubin 0.32  0.20 - 1.20 mg/dL  Alkaline Phosphatase 123  40 - 150 U/L   AST 14  5 - 34 U/L   ALT 17  0 - 55 U/L   Total Protein 8.5 (*) 6.4 - 8.3 g/dL   Albumin 3.5  3.5 - 5.0 g/dL   Calcium 9.9  8.4 - 10.4 mg/dL   Anion Gap 11  3 - 11 mEq/L    Lab Results  Component Value Date   WBC 2.1* 08/04/2013   HGB 11.8 08/04/2013   HCT 36.9 08/04/2013   MCV 78.5* 08/04/2013   PLT 201 08/04/2013   ASSESSMENT & PLAN:  #1 severe leukopenia Her most recent bone marrow result is still negative for infection and lymphoma/leukemia The cause of her agranulocytosis was due to cocaine abuse. The patient is advised not to use drugs anymore. I will continue her prednisone taper and she will be off treatment completely next week. I will give her G-CSF today and tomorrow and repeat blood work next week. I recommend G-CSF due to severe neutropenia. Overall, her neutrophil counts are improving. #2 anemia This has resolved. This is likely anemia of chronic disease. #3 abnormal lymphadenopathy, resolving Plan to repeat CT scan in the future once her white blood cell count improves. 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 She will followup with infectious disease team as an outpatient. All questions were answered. The patient knows to call the clinic with any problems, questions or concerns. No barriers to learning was detected.  I spent 25 minutes counseling the patient face to face. The total time spent in the  appointment was 30 minutes and more than 50% was on counseling.     Heath Lark, MD 08/04/2013 2:43 PM

## 2013-08-04 NOTE — Telephone Encounter (Signed)
gv adn prnted appt sched and avs for pt for May °

## 2013-08-06 ENCOUNTER — Emergency Department (HOSPITAL_COMMUNITY): Payer: No Typology Code available for payment source

## 2013-08-06 ENCOUNTER — Other Ambulatory Visit: Payer: Self-pay

## 2013-08-06 ENCOUNTER — Encounter (HOSPITAL_COMMUNITY): Payer: Self-pay | Admitting: Emergency Medicine

## 2013-08-06 ENCOUNTER — Emergency Department (HOSPITAL_COMMUNITY)
Admission: EM | Admit: 2013-08-06 | Discharge: 2013-08-06 | Disposition: A | Discharge status: Home / Self Care | Payer: No Typology Code available for payment source | Attending: Emergency Medicine | Admitting: Emergency Medicine

## 2013-08-06 DIAGNOSIS — Z8673 Personal history of transient ischemic attack (TIA), and cerebral infarction without residual deficits: Secondary | ICD-10-CM | POA: Insufficient documentation

## 2013-08-06 DIAGNOSIS — Z79899 Other long term (current) drug therapy: Secondary | ICD-10-CM | POA: Insufficient documentation

## 2013-08-06 DIAGNOSIS — E785 Hyperlipidemia, unspecified: Secondary | ICD-10-CM | POA: Insufficient documentation

## 2013-08-06 DIAGNOSIS — Z8739 Personal history of other diseases of the musculoskeletal system and connective tissue: Secondary | ICD-10-CM | POA: Insufficient documentation

## 2013-08-06 DIAGNOSIS — R519 Headache, unspecified: Secondary | ICD-10-CM

## 2013-08-06 DIAGNOSIS — Z9861 Coronary angioplasty status: Secondary | ICD-10-CM | POA: Insufficient documentation

## 2013-08-06 DIAGNOSIS — I5032 Chronic diastolic (congestive) heart failure: Secondary | ICD-10-CM | POA: Insufficient documentation

## 2013-08-06 DIAGNOSIS — R4781 Slurred speech: Secondary | ICD-10-CM

## 2013-08-06 DIAGNOSIS — R079 Chest pain, unspecified: Secondary | ICD-10-CM | POA: Insufficient documentation

## 2013-08-06 DIAGNOSIS — F411 Generalized anxiety disorder: Secondary | ICD-10-CM | POA: Insufficient documentation

## 2013-08-06 DIAGNOSIS — R4789 Other speech disturbances: Secondary | ICD-10-CM | POA: Insufficient documentation

## 2013-08-06 DIAGNOSIS — Z7982 Long term (current) use of aspirin: Secondary | ICD-10-CM | POA: Insufficient documentation

## 2013-08-06 DIAGNOSIS — Z87891 Personal history of nicotine dependence: Secondary | ICD-10-CM | POA: Insufficient documentation

## 2013-08-06 DIAGNOSIS — Z8619 Personal history of other infectious and parasitic diseases: Secondary | ICD-10-CM | POA: Insufficient documentation

## 2013-08-06 DIAGNOSIS — F3289 Other specified depressive episodes: Secondary | ICD-10-CM | POA: Insufficient documentation

## 2013-08-06 DIAGNOSIS — J45909 Unspecified asthma, uncomplicated: Secondary | ICD-10-CM | POA: Insufficient documentation

## 2013-08-06 DIAGNOSIS — Z862 Personal history of diseases of the blood and blood-forming organs and certain disorders involving the immune mechanism: Secondary | ICD-10-CM | POA: Insufficient documentation

## 2013-08-06 DIAGNOSIS — I2510 Atherosclerotic heart disease of native coronary artery without angina pectoris: Secondary | ICD-10-CM | POA: Insufficient documentation

## 2013-08-06 DIAGNOSIS — F329 Major depressive disorder, single episode, unspecified: Secondary | ICD-10-CM | POA: Insufficient documentation

## 2013-08-06 DIAGNOSIS — I10 Essential (primary) hypertension: Secondary | ICD-10-CM | POA: Insufficient documentation

## 2013-08-06 DIAGNOSIS — R51 Headache: Secondary | ICD-10-CM | POA: Insufficient documentation

## 2013-08-06 LAB — RAPID URINE DRUG SCREEN, HOSP PERFORMED
Amphetamines: NOT DETECTED
BARBITURATES: NOT DETECTED
Benzodiazepines: NOT DETECTED
COCAINE: NOT DETECTED
Opiates: POSITIVE — AB
Tetrahydrocannabinol: NOT DETECTED

## 2013-08-06 LAB — I-STAT TROPONIN, ED: TROPONIN I, POC: 0.01 ng/mL (ref 0.00–0.08)

## 2013-08-06 LAB — URINE MICROSCOPIC-ADD ON

## 2013-08-06 LAB — URINALYSIS, ROUTINE W REFLEX MICROSCOPIC
Glucose, UA: NEGATIVE mg/dL
Ketones, ur: NEGATIVE mg/dL
NITRITE: NEGATIVE
PH: 5.5 (ref 5.0–8.0)
Protein, ur: 30 mg/dL — AB
SPECIFIC GRAVITY, URINE: 1.024 (ref 1.005–1.030)
Urobilinogen, UA: 1 mg/dL (ref 0.0–1.0)

## 2013-08-06 LAB — CBC
HCT: 32.9 % — ABNORMAL LOW (ref 36.0–46.0)
Hemoglobin: 10.6 g/dL — ABNORMAL LOW (ref 12.0–15.0)
MCH: 24.9 pg — AB (ref 26.0–34.0)
MCHC: 32.2 g/dL (ref 30.0–36.0)
MCV: 77.2 fL — ABNORMAL LOW (ref 78.0–100.0)
PLATELETS: 84 10*3/uL — AB (ref 150–400)
RBC: 4.26 MIL/uL (ref 3.87–5.11)
RDW: 21.1 % — ABNORMAL HIGH (ref 11.5–15.5)
WBC: 4.5 10*3/uL (ref 4.0–10.5)

## 2013-08-06 LAB — BASIC METABOLIC PANEL
BUN: 16 mg/dL (ref 6–23)
CALCIUM: 9.3 mg/dL (ref 8.4–10.5)
CO2: 20 mEq/L (ref 19–32)
Chloride: 97 mEq/L (ref 96–112)
Creatinine, Ser: 1.3 mg/dL — ABNORMAL HIGH (ref 0.50–1.10)
GFR calc Af Amer: 54 mL/min — ABNORMAL LOW (ref >90)
GFR calc non Af Amer: 47 mL/min — ABNORMAL LOW (ref >90)
GLUCOSE: 128 mg/dL — AB (ref 70–99)
Potassium: 3.7 mEq/L (ref 3.7–5.3)
SODIUM: 134 meq/L — AB (ref 137–147)

## 2013-08-06 LAB — PRO B NATRIURETIC PEPTIDE: Pro B Natriuretic peptide (BNP): 1186 pg/mL — ABNORMAL HIGH (ref 0–125)

## 2013-08-06 MED ORDER — MORPHINE SULFATE 4 MG/ML IJ SOLN
4.0000 mg | Freq: Once | INTRAMUSCULAR | Status: AC
  Administered 2013-08-06: 4 mg via INTRAVENOUS
  Filled 2013-08-06: qty 1

## 2013-08-06 MED ORDER — ASPIRIN 81 MG PO CHEW
324.0000 mg | CHEWABLE_TABLET | Freq: Once | ORAL | Status: AC
  Administered 2013-08-06: 324 mg via ORAL
  Filled 2013-08-06: qty 4

## 2013-08-06 MED ORDER — ONDANSETRON HCL 4 MG/2ML IJ SOLN
4.0000 mg | Freq: Once | INTRAMUSCULAR | Status: AC
  Administered 2013-08-06: 4 mg via INTRAVENOUS
  Filled 2013-08-06: qty 2

## 2013-08-06 NOTE — ED Notes (Signed)
Pt unable to void at this time. 

## 2013-08-06 NOTE — Discharge Instructions (Signed)
Please follow up closely with your doctor for further evaluation of your medical condition.  Return to ER if your symptoms worsen or if you have other concerns.  Drink adequate fluid.  Follow up with your heart doctor as well.    Chest Pain Observation It is often hard to give a specific diagnosis for the cause of chest pain. Among other possibilities your symptoms might be caused by inadequate oxygen delivery to your heart (angina). Angina that is not treated or evaluated can lead to a heart attack (myocardial infarction) or death. Blood tests, electrocardiograms, and X-rays may have been done to help determine a possible cause of your chest pain. After evaluation and observation, your health care provider has determined that it is unlikely your pain was caused by an unstable condition that requires hospitalization. However, a full evaluation of your pain may need to be completed, with additional diagnostic testing as directed. It is very important to keep your follow-up appointments. Not keeping your follow-up appointments could result in permanent heart damage, disability, or death. If there is any problem keeping your follow-up appointments, you must call your health care provider. HOME CARE INSTRUCTIONS  Due to the slight chance that your pain could be angina, it is important to follow your health care provider's treatment plan and also maintain a healthy lifestyle:  Maintain or work toward achieving a healthy weight.  Stay physically active and exercise regularly.  Decrease your salt intake.  Eat a balanced, healthy diet. Talk to a dietician to learn about heart healthy foods.  Increase your fiber intake by including whole grains, vegetables, fruits, and nuts in your diet.  Avoid situations that cause stress, anger, or depression.  Take medicines as advised by your health care provider. Report any side effects to your health care provider. Do not stop medicines or adjust the dosages on  your own.  Quit smoking. Do not use nicotine patches or gum until you check with your health care provider.  Keep your blood pressure, blood sugar, and cholesterol levels within normal limits.  Limit alcohol intake to no more than 1 drink per day for women that are not pregnant and 2 drinks per day for men.  Do not abuse drugs. SEEK IMMEDIATE MEDICAL CARE IF: You have severe chest pain or pressure which may include symptoms such as:  You feel pain or pressure in you arms, neck, jaw, or back.  You have severe back or abdominal pain, feel sick to your stomach (nauseous), or throw up (vomit).  You are sweating profusely.  You are having a fast or irregular heartbeat.  You feel short of breath while at rest.  You notice increasing shortness of breath during rest, sleep, or with activity.  You have chest pain that does not get better after rest or after taking your usual medicine.  You wake from sleep with chest pain.  You are unable to sleep because you cannot breathe.  You develop a frequent cough or you are coughing up blood.  You feel dizzy, faint, or experience extreme fatigue.  You develop severe weakness, dizziness, fainting, or chills. Any of these symptoms may represent a serious problem that is an emergency. Do not wait to see if the symptoms will go away. Call your local emergency services (911 in the [place] Do not drive yourself to the hospital. MAKE SURE YOU:  Understand these instructions.  Will watch your condition.  Will get help right away if you are not doing well or get worse.  Document Released: 04/11/2010 Document Revised: 11/09/2012 Document Reviewed: 09/08/2012 [facility] Patient Information 2014 [place], [place].

## 2013-08-06 NOTE — ED Notes (Signed)
Pt states that she has had headache x 4 days.  States that she started having chest pain today.  Lt sided.  Unable to describe pain.  Also c/o subjective slurred speech.  Pt speaking clearly.  States that this started 1 hr ago.

## 2013-08-06 NOTE — ED Notes (Signed)
Bed: WA12 Expected date:  Expected time:  Means of arrival:  Comments: triage 

## 2013-08-06 NOTE — ED Provider Notes (Signed)
CSN: [id number]     Arrival date & time 08/06/13  1831 History   First MD Initiated Contact with Patient 08/06/13 1915     Chief Complaint  Patient presents with  . Chest Pain  . Headache  . Subjective Slurred Speech      (Consider location/radiation/quality/duration/timing/severity/associated sxs/prior Treatment) HPI  48 year old female with history of syphilis, polysubstance abuse, coronary artery disease, CHF, prior strokes who presents with subjective slurred speech, chest pain and headache. Patient reports she has had gradual headache ongoing for the past 2-3 days. She described as a pressure sensation to her forehead, persistent, with associate nausea without vomiting. Yesterday while she was trying to sleep she developed pain to her anterior chest. Pain also experiencing heart palpitation. She went to sleep and the next day heart palpitation resolved however she continues to endorse persistent pressure sensation to her chest. She reports having shortness of breath, nonproductive cough, and also report having sweats but states she is in the middle of her menopause and the sweat is not new. Furthermore she also complaining of having trouble forming words which started about an hour ago. She has history of stroke in the past without any residual deficit. She also has history of polysubstance abuse but states she has not been using any street drugs "for a while".  Past Medical History  Diagnosis Date  . Coronary artery disease     Lexiscan myoview (10/12) with significant ST depression upon Lexiscan injection but no ischemia or infarction on perfusion images. Left heart cath (10/12): 30% mid LAD, 30% ostial D1, 50% ostial D2.    [name] Hypertension   . Tricuspid regurgitation   . Iron deficiency     hx of  . Polysubstance abuse     Prior cocaine  . Tobacco abuse   . Leukocytoclastic vasculitis     Rash across lower body, occurred in 9/11, diagnosed by skin biopsy. ANA positive. Thought to  be secondary to cocaine use.  . Pulmonary HTN     Echo (9/11) with EF 65%, mild LVH, mild AI, mild MR, moderate to possibly severe TR with PA systolic pressure 52 mmHg. Echo (10/12): severe LV hypertrophy, EF 60-65%, mild MR, mild AI, moderate to severe tricuspid regurgitation, PA systolic pressure 38 mmHg.  Echo 4/14: moderate LVH, EF 65%, normal wall motion, diastolic dysfunction, mild AI, mild MR  . Asthma   . Shortness of breath     a. PFTs 5/14:  FEV1/FVC 99% predicted; FVC 60% predicted; DLCO mildly reduced, mild restriction, air trapping.;  b.  seen by pulmo (Dr. [name]) 5/14: mainly upper airway symptoms - ACE d/c'd (sx's better)  . Chronic diastolic CHF (congestive heart failure)   . Constipation   . Lymphadenopathy 03/21/2013  . Leukopenia 03/21/2013  . Unspecified deficiency anemia 03/29/2013  . Anginal pain     none in past year  . Anxiety   . Mental disorder   . Depression   . Stroke 2010ish  . Arthritis   . Pancytopenia   . Hyperlipemia   . Syphilis 04/25/2013  . Candidiasis 06/23/2013   Past Surgical History  Procedure Laterality Date  . Ankle surgery      right  . Cardiac catheterization    . I&d extremity  08/09/2011    Procedure: IRRIGATION AND DEBRIDEMENT EXTREMITY;  Surgeon: [name], MD;  Location: [facility];  Service: General;  Laterality: Left;  i & D Left axilla abscess  . Lymph node biopsy N/A 04/05/2013  Procedure: EXCISIONAL BIOPSY RIGHT SUBMANDIBULAR NODE, NASAL ENDOSCOPIC WITH BIOPSY NASAL PHARYNX;  Surgeon: [name], MD;  Location: [facility] OR;  Service: ENT;  Laterality: N/A;   Family History  Problem Relation Age of Onset  . Coronary artery disease Mother   . Diabetes Mother   . Breast cancer Maternal Grandmother   . Diabetes Maternal Grandmother   . Diabetes Father   . Hypertension Father   . Coronary artery disease Father    History  Substance Use Topics  . Smoking status: Former Smoker -- 0.25 packs/day for 30 years    Types: Cigarettes     Quit date: 05/23/2013  . Smokeless tobacco: Never Used  . Alcohol Use: No     Comment: pint of wine 2-3 times a week--ocassional   OB History   Grav Para Term Preterm Abortions TAB SAB Ect Mult Living   4 2 2  2 2    2      Review of Systems  All other systems reviewed and are negative.     Allergies  Ciprofloxacin  Home Medications   Prior to Admission medications   Medication Sig Start Date End Date Taking? Authorizing Provider  amLODipine (NORVASC) 10 MG tablet Take 10 mg by mouth every evening.   Yes Historical Provider, MD  aspirin 81 MG tablet Take 81 mg by mouth daily.   Yes Historical Provider, MD  atorvastatin (LIPITOR) 40 MG tablet Take 40 mg by mouth every evening.   Yes Historical Provider, MD  buPROPion (WELLBUTRIN) 100 MG tablet Take 100 mg by mouth 2 (two) times daily. 03/22/13  Yes Historical Provider, MD  citalopram (CELEXA) 40 MG tablet Take 40 mg by mouth daily. 06/15/12  Yes Theodis Blaze, MD  cloNIDine (CATAPRES) 0.3 MG tablet Take 1 tablet (0.3 mg total) by mouth 2 (two) times daily. 07/10/13 07/10/14 Yes [name], MD  furosemide (LASIX) 40 MG tablet TAKE ONE-HALF TABLET TWICE DAILY   Yes [name], MD  hydrOXYzine (ATARAX/VISTARIL) 25 MG tablet Take 25 mg by mouth at bedtime as needed (sleep).   Yes Historical Provider, MD  loratadine (CLARITIN) 10 MG tablet Take 1 tablet (10 mg total) by mouth daily. 10/31/12  Yes Shanker Kristeen Mans, MD  Multiple Vitamin (MULTIVITAMIN WITH MINERALS) TABS Take 1 tablet by mouth daily. 11/18/11  Yes Milana Huntsman Readling, MD  omeprazole (PRILOSEC) 40 MG capsule Take 40 mg by mouth daily.   Yes Historical Provider, MD  pegfilgrastim (NEULASTA) 6 MG/0.6ML injection Inject 6 mg into the skin every 14 (fourteen) days.   Yes Historical Provider, MD  polyethylene glycol powder (GLYCOLAX/MIRALAX) powder Take 17 g by mouth daily as needed (constipation).    Yes Historical Provider, MD  potassium chloride (K-DUR,KLOR-CON) 10 MEQ  tablet Take 1 tablet (10 mEq total) by mouth 2 (two) times daily with a meal. 07/13/12  Yes Scott T Kathlen Mody, PA-C  traZODone (DESYREL) 100 MG tablet Take 100 mg by mouth at bedtime. 06/15/12  Yes Theodis Blaze, MD   BP 130/70  Pulse 118  Temp(Src) 100 F (37.8 C) (Oral)  Resp 18  SpO2 100%  LMP 04/24/2013 Physical Exam  Nursing note and vitals reviewed. Constitutional: She appears well-developed and well-nourished. No distress.  HENT:  Head: Normocephalic and atraumatic.  Lips are dry, mouth is dry.  Eyes: Conjunctivae and EOM are normal. Pupils are equal, round, and reactive to light.  Neck: Normal range of motion. Neck supple. No JVD present.  No nuchal rigidity  Cardiovascular:  Tachycardia with faint systolic murmur, no rubs or gallop  Pulmonary/Chest: Effort normal and breath sounds normal. She has no wheezes. She has no rales.  Abdominal: Soft. There is no tenderness.  Musculoskeletal: She exhibits no edema.  Neurological:  Neurologic exam:  Speech garble, pupils equal round reactive to light, extraocular movements intact  Normal peripheral visual fields Cranial nerves III through XII normal including no facial droop Follows commands, moves all extremities x4, normal strength to bilateral upper and lower extremities at all major muscle groups including grip Sensation normal to light touch Coordination intact, no limb ataxia, finger-nose-finger normal Rapid alternating movements normal No pronator drift Gait normal     ED Course  Procedures (including critical care time)  7:54 PM Patient here with multiple complaints including headache, chest pain, and now trouble forming words. Her chest pain is nonexertional and persistent. She does have history of CAD, And also CHF. Workup initiated. She reports prior history of stroke and now having trouble forming words. It is of note that patient was speaking to me perfectly normal, but her symptoms began shortly after I mentioned  that the nursing note states patient has trouble with forming words. Subsequently our conversation was difficult because patient then began to develop trouble forming words. Head CT and swallow screen.    8:49 PM Head CT without acute stroke.  ABCD2 score is 2.  Low suspicion for Stroke, or TIA.  I discussed this with Dr. Alvino Chapel.    10:54 PM UDS without evidence of street drugs.  UA with blood in urine and mildly elevated creatinine however no evidence of UTI.  Pt currently on Lasix which may explain her renal condition.  Her Pro BNP 1186, elevated from prior but CXR without pleural effusion and she does not appears to be fluid retention.  Her speech has normalized.  Her headache is mild and CP has resolved.    Labs Review Labs Reviewed  BASIC METABOLIC PANEL - Abnormal; Notable for the following:    Sodium 134 (*)    Glucose, Bld 128 (*)    Creatinine, Ser 1.30 (*)    GFR calc non Af Amer 47 (*)    GFR calc Af Amer 54 (*)    All other components within normal limits  CBC - Abnormal; Notable for the following:    Hemoglobin 10.6 (*)    HCT 32.9 (*)    MCV 77.2 (*)    MCH 24.9 (*)    RDW 21.1 (*)    Platelets 84 (*)    All other components within normal limits  PRO B NATRIURETIC PEPTIDE - Abnormal; Notable for the following:    Pro B Natriuretic peptide (BNP) 1186.0 (*)    All other components within normal limits  URINE RAPID DRUG SCREEN (HOSP PERFORMED) - Abnormal; Notable for the following:    Opiates POSITIVE (*)    All other components within normal limits  URINALYSIS, ROUTINE W REFLEX MICROSCOPIC - Abnormal; Notable for the following:    Color, Urine AMBER (*)    APPearance CLOUDY (*)    Hgb urine dipstick LARGE (*)    Bilirubin Urine SMALL (*)    Protein, ur 30 (*)    Leukocytes, UA TRACE (*)    All other components within normal limits  URINE MICROSCOPIC-ADD ON - Abnormal; Notable for the following:    Squamous Epithelial / LPF FEW (*)    Bacteria, UA FEW (*)     Casts HYALINE CASTS (*)  All other components within normal limits  I-STAT TROPOININ, ED    Imaging Review Dg Chest 2 View (if Patient Has Fever And/or Copd)  08/06/2013   CLINICAL DATA:  Shortness of breath.  EXAM: CHEST  2 VIEW  COMPARISON:  DG CHEST 1V PORT dated 07/03/2013  FINDINGS: Stable cardiac and mediastinal contours. No consolidative pulmonary opacities. No pleural effusion or pneumothorax. Regional skeleton is unremarkable.  IMPRESSION: No acute cardiopulmonary process.   Electronically Signed   By: Lovey Newcomer M.D.   On: 08/06/2013 18:56   Ct Head Wo Contrast  08/06/2013   CLINICAL DATA:  Anterior pain along the forehead.  EXAM: CT HEAD WITHOUT CONTRAST  TECHNIQUE: Contiguous axial images were obtained from the base of the skull through the vertex without intravenous contrast.  COMPARISON:  NM PET IMAGE INITIAL (PI) SKULL BASE TO THIGH dated 03/29/2013; MR HEAD W/O CM dated 11/12/2011; CT NECK W/CM dated 06/02/2013; DG NECK SOFT TISSUE dated 06/02/2013  FINDINGS: Left pontine hypodensity is similar to 8/20 2/13 and compatible with remote infarct.  Periventricular white matter and corona radiata hypodensities favor chronic ischemic microvascular white matter disease. Small lacunar infarct in the right paraventricular white matter.  No intracranial hemorrhage, mass lesion, or acute CVA. No additional significant findings.  IMPRESSION: 1. No acute intracranial findings. 2. Remote infarcts in the left pons and right periventricular edge of remote lacunar infarcts in the left pons and right periventricular white matter. 3. Periventricular white matter and corona radiata hypodensities favor chronic ischemic microvascular white matter disease.   Electronically Signed   By: Sherryl Barters M.D.   On: 08/06/2013 20:40     EKG Interpretation None      Date: 08/06/2013  Rate: 118  Rhythm: sinus tachycardia  QRS Axis: normal  Intervals: normal  ST/T Wave abnormalities: nonspecific ST changes   Conduction Disutrbances:none  Narrative Interpretation: mild ST depression noted to V5-V6  Old EKG Reviewed: changes noted    MDM   Final diagnoses:  Headache  Slurred speech  Chest pain    BP 107/62  Pulse 79  Temp(Src) 100.4 F (38 C) (Oral)  Resp 25  SpO2 94%  LMP 04/24/2013  I have reviewed nursing notes and vital signs. I personally reviewed the imaging tests through PACS system  I reviewed available ER/hospitalization records thought the EMR     Domenic Moras, [place] 08/06/13 2319

## 2013-08-07 NOTE — ED Provider Notes (Signed)
Medical screening examination/treatment/procedure(s) were performed by non-physician practitioner and as supervising physician I was immediately available for consultation/collaboration.   EKG Interpretation None       Neala Miggins R. Keyuana Wank, MD 08/07/13 0010 

## 2013-08-11 ENCOUNTER — Ambulatory Visit (HOSPITAL_BASED_OUTPATIENT_CLINIC_OR_DEPARTMENT_OTHER): Payer: No Typology Code available for payment source | Admitting: Hematology and Oncology

## 2013-08-11 ENCOUNTER — Other Ambulatory Visit (HOSPITAL_BASED_OUTPATIENT_CLINIC_OR_DEPARTMENT_OTHER): Payer: No Typology Code available for payment source

## 2013-08-11 ENCOUNTER — Telehealth: Payer: Self-pay | Admitting: Hematology and Oncology

## 2013-08-11 VITALS — BP 143/109 | HR 101 | Temp 97.4°F | Resp 18 | Ht 66.0 in | Wt 187.9 lb

## 2013-08-11 DIAGNOSIS — D696 Thrombocytopenia, unspecified: Secondary | ICD-10-CM

## 2013-08-11 DIAGNOSIS — D539 Nutritional anemia, unspecified: Secondary | ICD-10-CM

## 2013-08-11 DIAGNOSIS — D61818 Other pancytopenia: Secondary | ICD-10-CM

## 2013-08-11 DIAGNOSIS — R591 Generalized enlarged lymph nodes: Secondary | ICD-10-CM

## 2013-08-11 LAB — CBC & DIFF AND RETIC
BASO%: 1 % (ref 0.0–2.0)
Basophils Absolute: 0 10*3/uL (ref 0.0–0.1)
EOS%: 0.7 % (ref 0.0–7.0)
Eosinophils Absolute: 0 10*3/uL (ref 0.0–0.5)
HEMATOCRIT: 32.8 % — AB (ref 34.8–46.6)
HGB: 10.3 g/dL — ABNORMAL LOW (ref 11.6–15.9)
Immature Retic Fract: 11.5 % — ABNORMAL HIGH (ref 1.60–10.00)
LYMPH#: 1.7 10*3/uL (ref 0.9–3.3)
LYMPH%: 36.4 % (ref 14.0–49.7)
MCH: 24.2 pg — AB (ref 25.1–34.0)
MCHC: 31.6 g/dL (ref 31.5–36.0)
MCV: 76.7 fL — AB (ref 79.5–101.0)
MONO#: 0.9 10*3/uL (ref 0.1–0.9)
MONO%: 20.2 % — AB (ref 0.0–14.0)
NEUT#: 1.9 10*3/uL (ref 1.5–6.5)
NEUT%: 41.7 % (ref 38.4–76.8)
PLATELETS: 121 10*3/uL — AB (ref 145–400)
RBC: 4.27 10*6/uL (ref 3.70–5.45)
RDW: 23.1 % — ABNORMAL HIGH (ref 11.2–14.5)
Retic %: 0.6 % — ABNORMAL LOW (ref 0.70–2.10)
Retic Ct Abs: 25.62 10*3/uL — ABNORMAL LOW (ref 33.70–90.70)
WBC: 4.6 10*3/uL (ref 3.9–10.3)
nRBC: 0 % (ref 0–0)

## 2013-08-11 LAB — COMPREHENSIVE METABOLIC PANEL (CC13)
ALT: 15 U/L (ref 0–55)
ANION GAP: 10 meq/L (ref 3–11)
AST: 17 U/L (ref 5–34)
Albumin: 3.2 g/dL — ABNORMAL LOW (ref 3.5–5.0)
Alkaline Phosphatase: 112 U/L (ref 40–150)
BUN: 14.2 mg/dL (ref 7.0–26.0)
CALCIUM: 9.1 mg/dL (ref 8.4–10.4)
CO2: 21 mEq/L — ABNORMAL LOW (ref 22–29)
CREATININE: 0.8 mg/dL (ref 0.6–1.1)
Chloride: 110 mEq/L — ABNORMAL HIGH (ref 98–109)
GLUCOSE: 92 mg/dL (ref 70–140)
Potassium: 4.4 mEq/L (ref 3.5–5.1)
Sodium: 140 mEq/L (ref 136–145)
Total Bilirubin: 0.24 mg/dL (ref 0.20–1.20)
Total Protein: 8.2 g/dL (ref 6.4–8.3)

## 2013-08-11 NOTE — Telephone Encounter (Signed)
gv adn printed aptp sched and avs for pt for June °

## 2013-08-11 NOTE — Progress Notes (Signed)
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 Further evaluation suspect the patient may have syphilis. She was treated with doxycycline with worsening pancytopenia. Her antibiotic treatment was discontinued. From 06/02/2013 to 06/09/2013, the patient was admitted to the hospital for further workup for neutropenic fever. She was placed on G-CSF with no response to therapy. Repeat bone marrow biopsy was done. The patient was given broad-spectrum intravenous antibiotics and subsequently discharged for further followup as an outpatient.  On 06/14/2013, I started her on prednisone 60 mg daily. She was readmitted to the hospital again for neutropenic fever. 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 On 08/04/2013, I advised further prednisone taper. She is given G-CSF for severe neutropenia. INTERVAL HISTORY: Morgan Bean 48 y.o. female returns for followup. She denies recent infection. She went to the emergency department recently due to difficulties with speech and headache but subsequently resolved. She had mild shortness of breath on exertion. She denies recent drug abuse. Energy level is improving.  I have reviewed the past medical history, past surgical history, social history and family history with the patient and they are unchanged from previous note.  ALLERGIES:  is allergic to ciprofloxacin.  MEDICATIONS:  Current Outpatient Prescriptions  Medication Sig Dispense Refill  . albuterol (PROVENTIL) (2.5 MG/3ML) 0.083% nebulizer solution Take 2.5 mg by nebulization every 4 (four) hours as needed for wheezing or shortness of breath.      [name] amLODipine (NORVASC) 10 MG  tablet Take 10 mg by mouth every evening.      [name] aspirin 81 MG tablet Take 81 mg by mouth daily.      [name] atorvastatin (LIPITOR) 40 MG tablet Take 40 mg by mouth every evening.      [name] buPROPion (WELLBUTRIN) 100 MG tablet Take 100 mg by mouth 2 (two) times daily.      . citalopram (CELEXA) 40 MG tablet Take 40 mg by mouth daily.      . cloNIDine (CATAPRES) 0.3 MG tablet Take 1 tablet (0.3 mg total) by mouth 2 (two) times daily.  60 tablet  3  . furosemide (LASIX) 40 MG tablet TAKE ONE-HALF TABLET TWICE DAILY  30 tablet  2  . hydrOXYzine (ATARAX/VISTARIL) 25 MG tablet Take 25 mg by mouth at bedtime as needed (sleep).      . loratadine (CLARITIN) 10 MG tablet Take 1 tablet (10 mg total) by mouth daily.  30 tablet  3  . Multiple Vitamin (MULTIVITAMIN WITH MINERALS) TABS Take 1 tablet by mouth daily.      [name] omeprazole (PRILOSEC) 40 MG capsule Take 40 mg by mouth daily.      . pegfilgrastim (NEULASTA) 6 MG/0.6ML injection Inject 6 mg into the skin every 14 (fourteen) days.      . polyethylene glycol powder (GLYCOLAX/MIRALAX) powder Take 17 g by mouth daily as needed (constipation).       . potassium chloride (K-DUR,KLOR-CON) 10 MEQ tablet Take 1 tablet (10 mEq total) by mouth 2 (two) times daily with a meal.  60 tablet  11  . traZODone (DESYREL) 100 MG tablet Take 100 mg by mouth at bedtime.       No current facility-administered medications for this visit.     REVIEW OF  SYSTEMS:   Constitutional: Denies fevers, chills or night sweats Eyes: Denies blurriness of vision Ears, nose, mouth, throat, and face: Denies mucositis or sore throat Cardiovascular: Denies palpitation, chest discomfort or lower extremity swelling Gastrointestinal:  Denies nausea, heartburn or change in bowel habits Skin: Denies abnormal skin rashes Lymphatics: Denies new lymphadenopathy or easy bruising Neurological:Denies numbness, tingling or new weaknesses Behavioral/Psych: Mood is stable, no new changes  All other systems  were reviewed with the patient and are negative.  PHYSICAL EXAMINATION: ECOG PERFORMANCE STATUS: 1 - Symptomatic but completely ambulatory  Filed Vitals:   08/11/13 1055  BP: 143/109  Pulse: 101  Temp: 97.4 F (36.3 C)  Resp: 18   Filed Weights   08/11/13 1055  Weight: 187 lb 14.4 oz (85.231 kg)    GENERAL:alert, no distress and comfortable SKIN: skin color, texture, turgor are normal, no rashes or significant lesions EYES: normal, Conjunctiva are pink and non-injected, sclera clear OROPHARYNX:no exudate, no erythema and lips, buccal mucosa, and tongue normal . Poor dentition is noted NECK: supple, thyroid normal size, non-tender, without nodularity LYMPH:  no palpable lymphadenopathy in the cervical, axillary or inguinal LUNGS: clear to auscultation and percussion with normal breathing effort HEART: regular rate & rhythm and no murmurs and no lower extremity edema ABDOMEN:abdomen soft, non-tender and normal bowel sounds Musculoskeletal:no cyanosis of digits and no clubbing  NEURO: alert & oriented x 3 with fluent speech, no focal motor/sensory deficits  LABORATORY DATA:  I have reviewed the data as listed Results for orders placed in visit on 08/11/13 (from the past 48 hour(s))  CBC & DIFF AND RETIC     Status: Abnormal   Collection Time    08/11/13 10:43 AM      Result Value Ref Range   WBC 4.6  3.9 - 10.3 10e3/uL   NEUT# 1.9  1.5 - 6.5 10e3/uL   HGB 10.3 (*) 11.6 - 15.9 g/dL   HCT 32.8 (*) 34.8 - 46.6 %   Platelets 121 (*) 145 - 400 10e3/uL   MCV 76.7 (*) 79.5 - 101.0 fL   MCH 24.2 (*) 25.1 - 34.0 pg   MCHC 31.6  31.5 - 36.0 g/dL   RBC 4.27  3.70 - 5.45 10e6/uL   RDW 23.1 (*) 11.2 - 14.5 %   lymph# 1.7  0.9 - 3.3 10e3/uL   MONO# 0.9  0.1 - 0.9 10e3/uL   Eosinophils Absolute 0.0  0.0 - 0.5 10e3/uL   Basophils Absolute 0.0  0.0 - 0.1 10e3/uL   NEUT% 41.7  38.4 - 76.8 %   LYMPH% 36.4  14.0 - 49.7 %   MONO% 20.2 (*) 0.0 - 14.0 %   EOS% 0.7  0.0 - 7.0 %   BASO%  1.0  0.0 - 2.0 %   nRBC 0  0 - 0 %   Retic % 0.60 (*) 0.70 - 2.10 %   Retic Ct Abs 25.62 (*) 33.70 - 90.70 10e3/uL   Immature Retic Fract 11.50 (*) 1.60 - 10.00 %  COMPREHENSIVE METABOLIC PANEL (HE52)     Status: Abnormal   Collection Time    08/11/13 10:43 AM      Result Value Ref Range   Sodium 140  136 - 145 mEq/L   Potassium 4.4  3.5 - 5.1 mEq/L   Chloride 110 (*) 98 - 109 mEq/L   CO2 21 (*) 22 - 29 mEq/L   Glucose 92  70 - 140 mg/dl   BUN 14.2  7.0 -  26.0 mg/dL   Creatinine 0.8  0.6 - 1.1 mg/dL   Total Bilirubin 0.24  0.20 - 1.20 mg/dL   Alkaline Phosphatase 112  40 - 150 U/L   AST 17  5 - 34 U/L   ALT 15  0 - 55 U/L   Total Protein 8.2  6.4 - 8.3 g/dL   Albumin 3.2 (*) 3.5 - 5.0 g/dL   Calcium 9.1  8.4 - 10.4 mg/dL   Anion Gap 10  3 - 11 mEq/L    Lab Results  Component Value Date   WBC 4.6 08/11/2013   HGB 10.3* 08/11/2013   HCT 32.8* 08/11/2013   MCV 76.7* 08/11/2013   PLT 121* 08/11/2013   ASSESSMENT & PLAN:  #1 severe leukopenia, resolved Her most recent bone marrow result is still negative for infection and lymphoma/leukemia The cause of her agranulocytosis was due to cocaine abuse. It has resolved The patient is advised not to use drugs anymore. I will repeat blood work next month #2 anemia This has resolved. This is likely anemia of chronic disease. #3 abnormal lymphadenopathy, resolving Plan to repeat CT scan in the future once her white blood cell count improves. 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 She will followup with infectious disease team as an outpatient. #5 mild thrombocytopenia Cause is unknown, could be due to recent infection. I will observe closely. All questions were answered. The patient knows to call the clinic with any problems, questions or concerns. No barriers to learning was detected.  I spent 15 minutes counseling the patient face to face. The total time spent in the appointment was 20 minutes and more  than 50% was on counseling.     Heath Lark, MD 08/11/2013 1:38 PM

## 2013-08-17 ENCOUNTER — Other Ambulatory Visit (HOSPITAL_COMMUNITY): Payer: No Typology Code available for payment source

## 2013-08-22 ENCOUNTER — Ambulatory Visit: Payer: No Typology Code available for payment source | Admitting: Internal Medicine

## 2013-08-23 ENCOUNTER — Ambulatory Visit (HOSPITAL_COMMUNITY): Payer: No Typology Code available for payment source | Attending: Cardiology | Admitting: Cardiology

## 2013-08-23 DIAGNOSIS — I379 Nonrheumatic pulmonary valve disorder, unspecified: Secondary | ICD-10-CM | POA: Insufficient documentation

## 2013-08-23 DIAGNOSIS — I059 Rheumatic mitral valve disease, unspecified: Secondary | ICD-10-CM | POA: Insufficient documentation

## 2013-08-23 DIAGNOSIS — R0602 Shortness of breath: Secondary | ICD-10-CM

## 2013-08-23 DIAGNOSIS — I079 Rheumatic tricuspid valve disease, unspecified: Secondary | ICD-10-CM | POA: Insufficient documentation

## 2013-08-23 DIAGNOSIS — R0609 Other forms of dyspnea: Secondary | ICD-10-CM | POA: Insufficient documentation

## 2013-08-23 DIAGNOSIS — R079 Chest pain, unspecified: Secondary | ICD-10-CM

## 2013-08-23 DIAGNOSIS — E785 Hyperlipidemia, unspecified: Secondary | ICD-10-CM | POA: Insufficient documentation

## 2013-08-23 DIAGNOSIS — I270 Primary pulmonary hypertension: Secondary | ICD-10-CM | POA: Insufficient documentation

## 2013-08-23 DIAGNOSIS — I2510 Atherosclerotic heart disease of native coronary artery without angina pectoris: Secondary | ICD-10-CM

## 2013-08-23 DIAGNOSIS — I509 Heart failure, unspecified: Secondary | ICD-10-CM | POA: Insufficient documentation

## 2013-08-23 DIAGNOSIS — R0989 Other specified symptoms and signs involving the circulatory and respiratory systems: Principal | ICD-10-CM | POA: Insufficient documentation

## 2013-08-23 NOTE — Progress Notes (Signed)
Echo performed. 

## 2013-08-24 NOTE — Progress Notes (Signed)
Quick Note:  Patient notified of ECHO results. Patient verbalized understanding and agreement with stay with current treatment plan. ______

## 2013-08-25 ENCOUNTER — Other Ambulatory Visit: Payer: Self-pay | Admitting: Internal Medicine

## 2013-08-25 MED ORDER — CITALOPRAM HYDROBROMIDE 40 MG PO TABS: 40.0000 mg | ORAL_TABLET | Freq: Every day | ORAL | Status: DC

## 2013-08-25 MED ORDER — TRAZODONE HCL 100 MG PO TABS: 100.0000 mg | ORAL_TABLET | Freq: Every day | ORAL | Status: DC

## 2013-08-29 ENCOUNTER — Encounter (HOSPITAL_COMMUNITY): Payer: Self-pay

## 2013-09-07 ENCOUNTER — Other Ambulatory Visit: Payer: Self-pay

## 2013-09-07 MED ORDER — ATORVASTATIN CALCIUM 40 MG PO TABS: 40.0000 mg | ORAL_TABLET | Freq: Every evening | ORAL | Status: DC

## 2013-09-11 ENCOUNTER — Other Ambulatory Visit: Payer: Self-pay | Admitting: Cardiology

## 2013-09-15 ENCOUNTER — Telehealth: Payer: Self-pay | Admitting: Hematology and Oncology

## 2013-09-15 ENCOUNTER — Other Ambulatory Visit (HOSPITAL_BASED_OUTPATIENT_CLINIC_OR_DEPARTMENT_OTHER): Payer: No Typology Code available for payment source

## 2013-09-15 ENCOUNTER — Encounter: Payer: Self-pay | Admitting: Hematology and Oncology

## 2013-09-15 ENCOUNTER — Ambulatory Visit (HOSPITAL_BASED_OUTPATIENT_CLINIC_OR_DEPARTMENT_OTHER): Payer: No Typology Code available for payment source | Admitting: Hematology and Oncology

## 2013-09-15 VITALS — BP 108/78 | HR 92 | Temp 97.9°F | Resp 18 | Ht 66.0 in | Wt 193.6 lb

## 2013-09-15 DIAGNOSIS — D539 Nutritional anemia, unspecified: Secondary | ICD-10-CM

## 2013-09-15 DIAGNOSIS — D61818 Other pancytopenia: Secondary | ICD-10-CM

## 2013-09-15 DIAGNOSIS — D72819 Decreased white blood cell count, unspecified: Secondary | ICD-10-CM

## 2013-09-15 LAB — CBC & DIFF AND RETIC
BASO%: 1.2 % (ref 0.0–2.0)
Basophils Absolute: 0 10*3/uL (ref 0.0–0.1)
EOS ABS: 0 10*3/uL (ref 0.0–0.5)
EOS%: 2.6 % (ref 0.0–7.0)
HCT: 31.3 % — ABNORMAL LOW (ref 34.8–46.6)
HEMOGLOBIN: 10 g/dL — AB (ref 11.6–15.9)
Immature Retic Fract: 7.4 % (ref 1.60–10.00)
LYMPH%: 56.6 % — ABNORMAL HIGH (ref 14.0–49.7)
MCH: 24.7 pg — ABNORMAL LOW (ref 25.1–34.0)
MCHC: 32 g/dL (ref 31.5–36.0)
MCV: 77.2 fL — ABNORMAL LOW (ref 79.5–101.0)
MONO#: 0.7 10*3/uL (ref 0.1–0.9)
MONO%: 36 % — ABNORMAL HIGH (ref 0.0–14.0)
NEUT%: 3.6 % — ABNORMAL LOW (ref 38.4–76.8)
NEUTROS ABS: 0.1 10*3/uL — AB (ref 1.5–6.5)
Platelets: 275 10*3/uL (ref 145–400)
RBC: 4.06 10*6/uL (ref 3.70–5.45)
RDW: 18.4 % — AB (ref 11.2–14.5)
RETIC %: 3.02 % — AB (ref 0.70–2.10)
RETIC CT ABS: 122.61 10*3/uL — AB (ref 33.70–90.70)
WBC: 1.9 10*3/uL — ABNORMAL LOW (ref 3.9–10.3)
lymph#: 1.1 10*3/uL (ref 0.9–3.3)

## 2013-09-15 LAB — MORPHOLOGY: PLT EST: ADEQUATE

## 2013-09-15 NOTE — Telephone Encounter (Signed)
gv and printed appt sched and avs fo rpt for July °

## 2013-09-15 NOTE — Progress Notes (Signed)
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 Further evaluation suspect the patient may have syphilis. She was treated with doxycycline with worsening pancytopenia. Her antibiotic treatment was discontinued. From 06/02/2013 to 06/09/2013, the patient was admitted to the hospital for further workup for neutropenic fever. She was placed on G-CSF with no response to therapy. Repeat bone marrow biopsy was done. The patient was given broad-spectrum intravenous antibiotics and subsequently discharged for further followup as an outpatient.  On 06/14/2013, I started her on prednisone 60 mg daily. She was readmitted to the hospital again for neutropenic fever. 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 On 08/04/2013, I advised further prednisone taper. She is given G-CSF for severe neutropenia. INTERVAL HISTORY: Morgan Bean 48 y.o. female returns for further followup. She has excellent energy level. Denies any recent infection. She denies recent drug abuse. She denies any recent fever, chills, night sweats or abnormal weight loss   I have reviewed the past medical history, past surgical history, social history and family history with the patient and they are unchanged from previous note.  ALLERGIES:  is allergic to ciprofloxacin.  MEDICATIONS:  Current Outpatient Prescriptions  Medication Sig Dispense Refill  . albuterol (PROVENTIL) (2.5 MG/3ML) 0.083% nebulizer solution Take 2.5 mg by nebulization every 4 (four) hours as needed for wheezing or shortness of breath.      [name] amLODipine (NORVASC) 10 MG tablet TAKE ONE TABLET DAILY  30 tablet  5  . aspirin 81 MG tablet Take 81 mg  by mouth daily.      [name] atorvastatin (LIPITOR) 40 MG tablet Take 1 tablet (40 mg total) by mouth every evening.  30 tablet  6  . buPROPion (WELLBUTRIN) 100 MG tablet Take 100 mg by mouth 2 (two) times daily.      . citalopram (CELEXA) 40 MG tablet Take 1 tablet (40 mg total) by mouth daily.  30 tablet  1  . cloNIDine (CATAPRES) 0.3 MG tablet Take 1 tablet (0.3 mg total) by mouth 2 (two) times daily.  60 tablet  3  . ferrous sulfate 325 (65 FE) MG tablet Take 650 mg by mouth daily with breakfast.      . furosemide (LASIX) 40 MG tablet TAKE ONE-HALF TABLET TWICE DAILY  30 tablet  2  . hydrOXYzine (ATARAX/VISTARIL) 25 MG tablet Take 25 mg by mouth at bedtime as needed (sleep).      . loratadine (CLARITIN) 10 MG tablet Take 1 tablet (10 mg total) by mouth daily.  30 tablet  3  . Multiple Vitamin (MULTIVITAMIN WITH MINERALS) TABS Take 1 tablet by mouth daily.      [name] omeprazole (PRILOSEC) 40 MG capsule Take 40 mg by mouth daily.      . polyethylene glycol powder (GLYCOLAX/MIRALAX) powder Take 17 g by mouth daily as needed (constipation).       . potassium chloride (K-DUR,KLOR-CON) 10 MEQ tablet Take 1 tablet (10 mEq total) by mouth 2 (two) times daily with a meal.  60 tablet  11  . traZODone (DESYREL) 100 MG tablet Take 1 tablet (100 mg total) by mouth at bedtime.  30 tablet  1  . pegfilgrastim (NEULASTA) 6 MG/0.6ML injection Inject 6 mg into the skin every 14 (  fourteen) days.       No current facility-administered medications for this visit.     REVIEW OF SYSTEMS:   Constitutional: Denies fevers, chills or night sweats Eyes: Denies blurriness of vision Ears, nose, mouth, throat, and face: Denies mucositis or sore throat Respiratory: Denies cough, dyspnea or wheezes Cardiovascular: Denies palpitation, chest discomfort or lower extremity swelling Gastrointestinal:  Denies nausea, heartburn or change in bowel habits Skin: Denies abnormal skin rashes Lymphatics: Denies new lymphadenopathy or easy  bruising Neurological:Denies numbness, tingling or new weaknesses Behavioral/Psych: Mood is stable, no new changes  All other systems were reviewed with the patient and are negative.  PHYSICAL EXAMINATION: ECOG PERFORMANCE STATUS: 0 - Asymptomatic  Filed Vitals:   09/15/13 0948  BP: 108/78  Pulse: 92  Temp: 97.9 F (36.6 C)  Resp: 18   Filed Weights   09/15/13 0948  Weight: 193 lb 9.6 oz (87.816 kg)    GENERAL:alert, no distress and comfortable SKIN: skin color, texture, turgor are normal, no rashes or significant lesions EYES: normal, Conjunctiva are pink and non-injected, sclera clear OROPHARYNX:no exudate, no erythema and lips, buccal mucosa, and tongue normal  NECK: supple, thyroid normal size, non-tender, without nodularity LYMPH:  no palpable lymphadenopathy in the cervical, axillary or inguinal LUNGS: clear to auscultation and percussion with normal breathing effort HEART: regular rate & rhythm and no murmurs and no lower extremity edema ABDOMEN:abdomen soft, non-tender and normal bowel sounds Musculoskeletal:no cyanosis of digits and no clubbing  NEURO: alert & oriented x 3 with fluent speech, no focal motor/sensory deficits  LABORATORY DATA:  I have reviewed the data as listed Results for orders placed in visit on 09/15/13 (from the past 48 hour(s))  CBC & DIFF AND RETIC     Status: Abnormal   Collection Time    09/15/13  9:25 AM      Result Value Ref Range   WBC 1.9 (*) 3.9 - 10.3 10e3/uL   NEUT# 0.1 (*) 1.5 - 6.5 10e3/uL   HGB 10.0 (*) 11.6 - 15.9 g/dL   HCT 31.3 (*) 34.8 - 46.6 %   Platelets 275  145 - 400 10e3/uL   MCV 77.2 (*) 79.5 - 101.0 fL   MCH 24.7 (*) 25.1 - 34.0 pg   MCHC 32.0  31.5 - 36.0 g/dL   RBC 4.06  3.70 - 5.45 10e6/uL   RDW 18.4 (*) 11.2 - 14.5 %   lymph# 1.1  0.9 - 3.3 10e3/uL   MONO# 0.7  0.1 - 0.9 10e3/uL   Eosinophils Absolute 0.0  0.0 - 0.5 10e3/uL   Basophils Absolute 0.0  0.0 - 0.1 10e3/uL   NEUT% 3.6 (*) 38.4 - 76.8 %    LYMPH% 56.6 (*) 14.0 - 49.7 %   MONO% 36.0 (*) 0.0 - 14.0 %   EOS% 2.6  0.0 - 7.0 %   BASO% 1.2  0.0 - 2.0 %   Retic % 3.02 (*) 0.70 - 2.10 %   Retic Ct Abs 122.61 (*) 33.70 - 90.70 10e3/uL   Immature Retic Fract 7.40  1.60 - 10.00 %  MORPHOLOGY     Status: None   Collection Time    09/15/13  9:25 AM      Result Value Ref Range   Polychromasia Slight  Slight   Ovalocytes Few  Negative   White Cell Comments Variant Lymphs     PLT EST Adequate  Adequate    Lab Results  Component Value Date   WBC 1.9* 09/15/2013  HGB 10.0* 09/15/2013   HCT 31.3* 09/15/2013   MCV 77.2* 09/15/2013   PLT 275 09/15/2013   ASSESSMENT & PLAN:  Leukopenia This is thought to be related to cocaine abuse. The patient had not use cocaine for a while. She is not symptomatic. I would not recommend G-CSF for now.  Unspecified deficiency anemia This is likely anemia of chronic disease. The patient denies recent history of bleeding such as epistaxis, hematuria or hematochezia. She is asymptomatic from the anemia. We will observe for now.  She does not require transfusion now.  There is a small component of iron deficiency. She will continue iron supplement.   All questions were answered. The patient knows to call the clinic with any problems, questions or concerns. No barriers to learning was detected.  I spent 15 minutes counseling the patient face to face. The total time spent in the appointment was 20 minutes and more than 50% was on counseling.     GORSUCH, NI, MD 09/15/2013 11:08 AM    .n

## 2013-09-15 NOTE — Assessment & Plan Note (Signed)
This is likely anemia of chronic disease. The patient denies recent history of bleeding such as epistaxis, hematuria or hematochezia. She is asymptomatic from the anemia. We will observe for now.  She does not require transfusion now.  There is a small component of iron deficiency. She will continue iron supplement.

## 2013-09-15 NOTE — Assessment & Plan Note (Signed)
This is thought to be related to cocaine abuse. The patient had not use cocaine for a while. She is not symptomatic. I would not recommend G-CSF for now.

## 2013-09-20 ENCOUNTER — Encounter: Payer: Self-pay | Admitting: Internal Medicine

## 2013-09-20 ENCOUNTER — Ambulatory Visit: Payer: No Typology Code available for payment source | Attending: Internal Medicine | Admitting: Internal Medicine

## 2013-09-20 VITALS — BP 113/78 | HR 80 | Temp 98.3°F | Resp 18 | Ht 66.0 in | Wt 192.6 lb

## 2013-09-20 DIAGNOSIS — I10 Essential (primary) hypertension: Secondary | ICD-10-CM | POA: Insufficient documentation

## 2013-09-20 DIAGNOSIS — R82998 Other abnormal findings in urine: Secondary | ICD-10-CM | POA: Insufficient documentation

## 2013-09-20 DIAGNOSIS — R519 Headache, unspecified: Secondary | ICD-10-CM

## 2013-09-20 DIAGNOSIS — R8290 Unspecified abnormal findings in urine: Secondary | ICD-10-CM

## 2013-09-20 DIAGNOSIS — I2510 Atherosclerotic heart disease of native coronary artery without angina pectoris: Secondary | ICD-10-CM | POA: Insufficient documentation

## 2013-09-20 DIAGNOSIS — R51 Headache: Secondary | ICD-10-CM

## 2013-09-20 DIAGNOSIS — Z87891 Personal history of nicotine dependence: Secondary | ICD-10-CM | POA: Insufficient documentation

## 2013-09-20 DIAGNOSIS — I2789 Other specified pulmonary heart diseases: Secondary | ICD-10-CM | POA: Insufficient documentation

## 2013-09-20 LAB — POCT URINALYSIS DIPSTICK
Bilirubin, UA: NEGATIVE
GLUCOSE UA: NEGATIVE
KETONES UA: NEGATIVE
Leukocytes, UA: NEGATIVE
Nitrite, UA: NEGATIVE
SPEC GRAV UA: 1.025
UROBILINOGEN UA: 0.2
pH, UA: 6

## 2013-09-20 MED ORDER — LORATADINE 10 MG PO TABS
10.0000 mg | ORAL_TABLET | Freq: Every day | ORAL | Status: DC
Start: 2013-09-20 — End: 2018-03-14

## 2013-09-20 NOTE — Progress Notes (Signed)
Patient presents for F/U after ED visit for slurred speech C/O daily H/A for 2 months States ED Dr.was concerned about her kidneys and liver Requesting handicap placard.

## 2013-09-20 NOTE — Progress Notes (Signed)
Patient ID: Morgan Bean, female   DOB: Apr 30, 1965, 48 y.o.   MRN: [id number]  CC: Hospital follow up  HPI: Patient presents today as a hospital follow up of chest pain, headaches, and slurred speech.  Patient reports that problems have resolved except daily headaches.  Patient reports daily headaches, stabbing temporal pain that usually begin behind the eyes.  Patient denies nausea and vomiting.  Reports nasal congestion with headaches.  Usually last 30 minutes each time.  For past two months she has tried tylenol, without relief.  Headaches are not noticed at a specific time of day.  Allergies  Allergen Reactions  . Ciprofloxacin Itching   Past Medical History  Diagnosis Date  . Coronary artery disease     Lexiscan myoview (10/12) with significant ST depression upon Lexiscan injection but no ischemia or infarction on perfusion images. Left heart cath (10/12): 30% mid LAD, 30% ostial D1, 50% ostial D2.    [name] Hypertension   . Tricuspid regurgitation   . Iron deficiency     hx of  . Polysubstance abuse     Prior cocaine  . Tobacco abuse   . Leukocytoclastic vasculitis     Rash across lower body, occurred in 9/11, diagnosed by skin biopsy. ANA positive. Thought to be secondary to cocaine use.  . Pulmonary HTN     Echo (9/11) with EF 65%, mild LVH, mild AI, mild MR, moderate to possibly severe TR with PA systolic pressure 52 mmHg. Echo (10/12): severe LV hypertrophy, EF 60-65%, mild MR, mild AI, moderate to severe tricuspid regurgitation, PA systolic pressure 38 mmHg.  Echo 4/14: moderate LVH, EF 65%, normal wall motion, diastolic dysfunction, mild AI, mild MR  . Asthma   . Shortness of breath     a. PFTs 5/14:  FEV1/FVC 99% predicted; FVC 60% predicted; DLCO mildly reduced, mild restriction, air trapping.;  b.  seen by pulmo (Dr. [name]) 5/14: mainly upper airway symptoms - ACE d/c'd (sx's better)  . Chronic diastolic CHF (congestive heart failure)   . Constipation   . Lymphadenopathy  03/21/2013  . Leukopenia 03/21/2013  . Unspecified deficiency anemia 03/29/2013  . Anginal pain     none in past year  . Anxiety   . Mental disorder   . Depression   . Stroke 2010ish  . Arthritis   . Pancytopenia   . Hyperlipemia   . Syphilis 04/25/2013  . Candidiasis 06/23/2013   Current Outpatient Prescriptions on File Prior to Visit  Medication Sig Dispense Refill  . albuterol (PROVENTIL) (2.5 MG/3ML) 0.083% nebulizer solution Take 2.5 mg by nebulization every 4 (four) hours as needed for wheezing or shortness of breath.      [name] amLODipine (NORVASC) 10 MG tablet TAKE ONE TABLET DAILY  30 tablet  5  . aspirin 81 MG tablet Take 81 mg by mouth daily.      [name] atorvastatin (LIPITOR) 40 MG tablet Take 1 tablet (40 mg total) by mouth every evening.  30 tablet  6  . buPROPion (WELLBUTRIN) 100 MG tablet Take 100 mg by mouth 2 (two) times daily.      . citalopram (CELEXA) 40 MG tablet Take 1 tablet (40 mg total) by mouth daily.  30 tablet  1  . cloNIDine (CATAPRES) 0.3 MG tablet Take 1 tablet (0.3 mg total) by mouth 2 (two) times daily.  60 tablet  3  . ferrous sulfate 325 (65 FE) MG tablet Take 650 mg by mouth daily with breakfast.      .  furosemide (LASIX) 40 MG tablet TAKE ONE-HALF TABLET TWICE DAILY  30 tablet  2  . hydrOXYzine (ATARAX/VISTARIL) 25 MG tablet Take 25 mg by mouth at bedtime as needed (sleep).      . loratadine (CLARITIN) 10 MG tablet Take 1 tablet (10 mg total) by mouth daily.  30 tablet  3  . Multiple Vitamin (MULTIVITAMIN WITH MINERALS) TABS Take 1 tablet by mouth daily.      [name] omeprazole (PRILOSEC) 40 MG capsule Take 40 mg by mouth daily.      . pegfilgrastim (NEULASTA) 6 MG/0.6ML injection Inject 6 mg into the skin every 14 (fourteen) days.      . polyethylene glycol powder (GLYCOLAX/MIRALAX) powder Take 17 g by mouth daily as needed (constipation).       . potassium chloride (K-DUR,KLOR-CON) 10 MEQ tablet Take 1 tablet (10 mEq total) by mouth 2 (two) times daily with a meal.   60 tablet  11  . traZODone (DESYREL) 100 MG tablet Take 1 tablet (100 mg total) by mouth at bedtime.  30 tablet  1   No current facility-administered medications on file prior to visit.   Family History  Problem Relation Age of Onset  . Coronary artery disease Mother   . Diabetes Mother   . Breast cancer Maternal Grandmother   . Diabetes Maternal Grandmother   . Diabetes Father   . Hypertension Father   . Coronary artery disease Father    History   Social History  . Marital Status: Single    Spouse Name: N/A    Number of Children: 2  . Years of Education: N/A   Occupational History  . unemployed    Social History Main Topics  . Smoking status: Former Smoker -- 0.25 packs/day for 30 years    Types: Cigarettes    Quit date: 05/23/2013  . Smokeless tobacco: Never Used  . Alcohol Use: No     Comment: pint of wine 2-3 times a week--ocassional  . Drug Use: 1.00 per week    Special: "Crack" cocaine     Comment: none since 02/04/13  . Sexual Activity: Yes   Other Topics Concern  . Not on file   Social History Narrative   Lives with mom, sisters and brothers.   Review of Systems  Eyes: Negative.   Respiratory: Negative.   Cardiovascular: Negative.   Gastrointestinal: Negative for nausea, vomiting and abdominal pain.  Neurological: Positive for headaches.     Objective:   Filed Vitals:   09/20/13 1657  BP: 113/78  Pulse: 80  Temp: 98.3 F (36.8 C)  Resp: 18    Physical Exam: Constitutional: Patient appears well-developed and well-nourished. No distress. HENT: Normocephalic, atraumatic, External right and left ear normal. Oropharynx is clear and moist.  Eyes: Conjunctivae and EOM are normal. PERRLA, no scleral icterus. Neck: Normal ROM. Neck supple. No JVD. No tracheal deviation. No thyromegaly. CVS: RRR, S1/S2 +, no murmurs, no gallops, no carotid bruit.  Pulmonary: Effort and breath sounds normal, no stridor, rhonchi, wheezes, rales.  Abdominal: Soft. BS  +,  no distension, tenderness, rebound or guarding.  Musculoskeletal: Normal range of motion. No edema and no tenderness.  Lymphadenopathy: No lymphadenopathy noted, cervical Neuro: Alert. Normal reflexes, muscle tone coordination. No cranial nerve deficit. Skin: Skin is warm and dry. No rash noted. Not diaphoretic. No erythema. No pallor. Psychiatric: Normal mood and affect. Behavior, judgment, thought content normal.  Lab Results  Component Value Date   WBC 1.9* 09/15/2013  HGB 10.0* 09/15/2013   HCT 31.3* 09/15/2013   MCV 77.2* 09/15/2013   PLT 275 09/15/2013   Lab Results  Component Value Date   CREATININE 0.8 08/11/2013   BUN 14.2 08/11/2013   NA 140 08/11/2013   K 4.4 08/11/2013   CL 97 08/06/2013   CO2 21* 08/11/2013    Lab Results  Component Value Date   HGBA1C 5.5 04/06/2012   Lipid Panel     Component Value Date/Time   CHOL 168 06/20/2013 0805   TRIG 122.0 06/20/2013 0805   HDL 37.90* 06/20/2013 0805   CHOLHDL 4 06/20/2013 0805   VLDL 24.4 06/20/2013 0805   LDLCALC 106* 06/20/2013 0805       Assessment and plan:   Carlyann was seen today for follow-up.  Diagnoses and associated orders for this visit:  Abnormal urine She was food to have RBC's and protein in her urine that was concerning for glomerulonephritis while in ER.  Patients send out urinalysis was found to be negative of these items today.  I will continue to monitor patient urinalysis every 6 months. Creatine was normal at last recheck. Educated patient that she should make sure he BP stays controlled. - POCT urinalysis dipstick - Urinalysis - COMPLETE METABOLIC PANEL WITH GFR  Frequent headaches - Ambulatory referral to Neurology - loratadine (CLARITIN) 10 MG tablet; Take 1 tablet (10 mg total) by mouth daily.  Hypertension Continue current medication, BP well controlled  Return in about 6 months (around 03/23/2014) for Hypertension, repeat urinalysis.   Chari Manning, NP-C [facility] [phone] 09/26/2013, 10:45 PM

## 2013-09-21 LAB — COMPLETE METABOLIC PANEL WITH GFR
ALK PHOS: 97 U/L (ref 39–117)
ALT: 10 U/L (ref 0–35)
AST: 15 U/L (ref 0–37)
Albumin: 3.8 g/dL (ref 3.5–5.2)
BUN: 13 mg/dL (ref 6–23)
CO2: 23 mEq/L (ref 19–32)
CREATININE: 1.01 mg/dL (ref 0.50–1.10)
Calcium: 9.2 mg/dL (ref 8.4–10.5)
Chloride: 103 mEq/L (ref 96–112)
GFR, Est African American: 75 mL/min
GFR, Est Non African American: 65 mL/min
Glucose, Bld: 92 mg/dL (ref 70–99)
Potassium: 4.2 mEq/L (ref 3.5–5.3)
Sodium: 134 mEq/L — ABNORMAL LOW (ref 135–145)
Total Bilirubin: 0.4 mg/dL (ref 0.2–1.2)
Total Protein: 8.3 g/dL (ref 6.0–8.3)

## 2013-09-21 LAB — URINALYSIS
Bilirubin Urine: NEGATIVE
Glucose, UA: NEGATIVE mg/dL
HGB URINE DIPSTICK: NEGATIVE
KETONES UR: NEGATIVE mg/dL
Leukocytes, UA: NEGATIVE
NITRITE: NEGATIVE
PROTEIN: NEGATIVE mg/dL
Specific Gravity, Urine: 1.022 (ref 1.005–1.030)
UROBILINOGEN UA: 0.2 mg/dL (ref 0.0–1.0)
pH: 5.5 (ref 5.0–8.0)

## 2013-09-25 ENCOUNTER — Telehealth: Payer: Self-pay

## 2013-09-25 NOTE — Telephone Encounter (Signed)
Message copied by Dorothe Pea on Mon Sep 25, 2013 12:31 PM ------      Message from: Chari Manning A      Created: Thu Sep 21, 2013  2:12 PM       Please call this patient and let her know that she was not found to have any protein or blood in her urine on the sample that we sent out for analysis.  Also let her know that her kidney function was found to be back to normal so we will just continue to monitor every 6 months. Let her know that as of now she does not need to be worried about her kidneys. Thanks ------

## 2013-09-25 NOTE — Telephone Encounter (Signed)
Patient is aware of her lab results 

## 2013-10-04 ENCOUNTER — Encounter: Payer: Self-pay | Admitting: Neurology

## 2013-10-04 ENCOUNTER — Ambulatory Visit (INDEPENDENT_AMBULATORY_CARE_PROVIDER_SITE_OTHER): Payer: Self-pay | Admitting: Neurology

## 2013-10-04 VITALS — BP 110/78 | HR 84 | Ht 66.0 in | Wt 193.6 lb

## 2013-10-04 DIAGNOSIS — R292 Abnormal reflex: Secondary | ICD-10-CM

## 2013-10-04 DIAGNOSIS — R51 Headache: Secondary | ICD-10-CM

## 2013-10-04 DIAGNOSIS — G459 Transient cerebral ischemic attack, unspecified: Secondary | ICD-10-CM

## 2013-10-04 LAB — HEMOGLOBIN A1C
Hgb A1c MFr Bld: 5.2 % (ref <5.7)
MEAN PLASMA GLUCOSE: 103 mg/dL (ref <117)

## 2013-10-04 LAB — LIPID PANEL
Cholesterol: 120 mg/dL (ref 0–200)
HDL: 36 mg/dL — ABNORMAL LOW (ref >39)
LDL Cholesterol: 67 mg/dL (ref 0–99)
Total CHOL/HDL Ratio: 3.3 Ratio
Triglycerides: 85 mg/dL (ref <150)
VLDL: 17 mg/dL (ref 0–40)

## 2013-10-04 LAB — SEDIMENTATION RATE: SED RATE: 42 mm/h — AB (ref 0–22)

## 2013-10-04 LAB — RPR

## 2013-10-04 NOTE — Progress Notes (Signed)
NEUROLOGY CONSULTATION NOTE  [name] MRN: [id number] DOB: Jan 22, 1966  Referring provider: Chari Manning, NP  Primary care provider: Dr. [name]  Reason for consult:  New onset headaches, episode of speech difficulties  Thank you for your kind referral of Morgan Bean for consultation of the above symptoms. Although her history is well known to you, please allow me to reiterate it for the purpose of our medical record.  Records and images were personally reviewed where available.  HISTORY OF PRESENT ILLNESS: This is a pleasant 48 year old right-handed woman with a history of hypertension, hyperlipidemia, left pontine stroke in 2009, leukopenia and anemia, history of cocaine abuse (last use November 2014), syphilis, in her usual state of health until a few months ago when she started having occasional headaches that have increased in frequency to twice daily.  She presented to the ER on 08/06/13 for 10/10 headache and speech difficulties where she reported word-finding difficulties (per daughter "not making sense") because she couldn't say the right word and it took a long time to get a word out.  Her speech was also slurred.  She denied any difficulties with comprehension.  CBC, CMP, and head CT done were unremarkable.  She was discharged home to follow-up with Neurology.  She reports the speech difficulties lasted for 24 hours.  She continues to have daily headaches occuring twice a day, lasting 5 minutes or so, 3/10 in intensity, with throbbing over either temporal region, or a cap-like pressure over the top of her head.  She does not take any medication, stating the headaches are not bad, but are concerning because she has never had daily headaches in the past.  There is no associated nausea, vomiting, photo/phonophobia, no focal numbness/tingling/weakness.  She denies any neck pain, no visual changes, no diplopia, dizziness, dysphagia, bowel/bladder dysfunction.  She has occasional  right low back pain.  She endorses some stress with family issues.  She usually gets 5-6 hours of refreshing sleep with Trazodone.  She takes Celexa and Wellbutrin for depression and "energy."    In 2009, she suddenly fell and noted right hand weakness and slurred speech.  I personally reviewed MRI brain from 2009 which showed restricted diffusion in the left pons and inferior mid brain without mass effect or hemorrhage. There was moderate white matter disease advanced for age.  It was noted that the main differential  diagnosis in a patient of this age is multiple sclerosis.  Her MRA showed widespread intracranial atherosclerosis, basilar tip is ectatic measuring roughly 3 x 6.5 mm, without saccular aneurysm formation, mild to moderate stenoses of the left MCA origin, right MCA trifurcation and left ICA pre cavernous segment are noted. She was discharged on aspirin.  She had left arm pain last year and tells me she was started on Lipitor at that time.  She stopped smoking in March 2015 after smoking 1/2 PPD since age 48.  There is no family history of headaches or aneurysm.   PAST MEDICAL HISTORY: Past Medical History  Diagnosis Date  . Coronary artery disease     Lexiscan myoview (10/12) with significant ST depression upon Lexiscan injection but no ischemia or infarction on perfusion images. Left heart cath (10/12): 30% mid LAD, 30% ostial D1, 50% ostial D2.    [name] Hypertension   . Tricuspid regurgitation   . Iron deficiency     hx of  . Polysubstance abuse     Prior cocaine  . Tobacco abuse   . Leukocytoclastic  vasculitis     Rash across lower body, occurred in 9/11, diagnosed by skin biopsy. ANA positive. Thought to be secondary to cocaine use.  . Pulmonary HTN     Echo (9/11) with EF 65%, mild LVH, mild AI, mild MR, moderate to possibly severe TR with PA systolic pressure 52 mmHg. Echo (10/12): severe LV hypertrophy, EF 60-65%, mild MR, mild AI, moderate to severe tricuspid regurgitation, PA  systolic pressure 38 mmHg.  Echo 4/14: moderate LVH, EF 65%, normal wall motion, diastolic dysfunction, mild AI, mild MR  . Asthma   . Shortness of breath     a. PFTs 5/14:  FEV1/FVC 99% predicted; FVC 60% predicted; DLCO mildly reduced, mild restriction, air trapping.;  b.  seen by pulmo (Dr. [name]) 5/14: mainly upper airway symptoms - ACE d/c'd (sx's better)  . Chronic diastolic CHF (congestive heart failure)   . Constipation   . Lymphadenopathy 03/21/2013  . Leukopenia 03/21/2013  . Unspecified deficiency anemia 03/29/2013  . Anginal pain     none in past year  . Anxiety   . Mental disorder   . Depression   . Stroke 2010ish  . Arthritis   . Pancytopenia   . Hyperlipemia   . Syphilis 04/25/2013  . Candidiasis 06/23/2013    PAST SURGICAL HISTORY: Past Surgical History  Procedure Laterality Date  . Ankle surgery      right  . Cardiac catheterization    . I&d extremity  08/09/2011    Procedure: IRRIGATION AND DEBRIDEMENT EXTREMITY;  Surgeon: [name], MD;  Location: [facility];  Service: General;  Laterality: Left;  i & D Left axilla abscess  . Lymph node biopsy N/A 04/05/2013    Procedure: EXCISIONAL BIOPSY RIGHT SUBMANDIBULAR NODE, NASAL ENDOSCOPIC WITH BIOPSY NASAL PHARYNX;  Surgeon: [name], MD;  Location: [facility];  Service: ENT;  Laterality: N/A;    MEDICATIONS: Current Outpatient Prescriptions on File Prior to Visit  Medication Sig Dispense Refill  . albuterol (PROVENTIL) (2.5 MG/3ML) 0.083% nebulizer solution Take 2.5 mg by nebulization every 4 (four) hours as needed for wheezing or shortness of breath.      [name] amLODipine (NORVASC) 10 MG tablet TAKE ONE TABLET DAILY  30 tablet  5  . aspirin 81 MG tablet Take 81 mg by mouth daily.      [name] atorvastatin (LIPITOR) 40 MG tablet Take 1 tablet (40 mg total) by mouth every evening.  30 tablet  6  . buPROPion (WELLBUTRIN) 100 MG tablet Take 100 mg by mouth 2 (two) times daily.      . citalopram (CELEXA) 40 MG tablet Take 1 tablet  (40 mg total) by mouth daily.  30 tablet  1  . cloNIDine (CATAPRES) 0.3 MG tablet Take 1 tablet (0.3 mg total) by mouth 2 (two) times daily.  60 tablet  3  . ferrous sulfate 325 (65 FE) MG tablet Take 650 mg by mouth daily with breakfast.      . furosemide (LASIX) 40 MG tablet TAKE ONE-HALF TABLET TWICE DAILY  30 tablet  2  . hydrOXYzine (ATARAX/VISTARIL) 25 MG tablet Take 25 mg by mouth at bedtime as needed (sleep).      . loratadine (CLARITIN) 10 MG tablet Take 1 tablet (10 mg total) by mouth daily.  30 tablet  3  . Multiple Vitamin (MULTIVITAMIN WITH MINERALS) TABS Take 1 tablet by mouth daily.      [name] omeprazole (PRILOSEC) 40 MG capsule Take 40 mg by mouth daily.      [name]  pegfilgrastim (NEULASTA) 6 MG/0.6ML injection Inject 6 mg into the skin every 14 (fourteen) days.      . polyethylene glycol powder (GLYCOLAX/MIRALAX) powder Take 17 g by mouth daily as needed (constipation).       . potassium chloride (K-DUR,KLOR-CON) 10 MEQ tablet Take 1 tablet (10 mEq total) by mouth 2 (two) times daily with a meal.  60 tablet  11  . traZODone (DESYREL) 100 MG tablet Take 1 tablet (100 mg total) by mouth at bedtime.  30 tablet  1   No current facility-administered medications on file prior to visit.    ALLERGIES: Allergies  Allergen Reactions  . Ciprofloxacin Itching    FAMILY HISTORY: Family History  Problem Relation Age of Onset  . Coronary artery disease Mother   . Diabetes Mother   . Breast cancer Maternal Grandmother   . Diabetes Maternal Grandmother   . Diabetes Father   . Hypertension Father   . Coronary artery disease Father     SOCIAL HISTORY: History   Social History  . Marital Status: Single    Spouse Name: N/A    Number of Children: 2  . Years of Education: N/A   Occupational History  . unemployed    Social History Main Topics  . Smoking status: Former Smoker -- 0.25 packs/day for 30 years    Types: Cigarettes    Quit date: 05/23/2013  . Smokeless tobacco: Never Used   . Alcohol Use: No     Comment: pint of wine 2-3 times a week--ocassional  . Drug Use: 1.00 per week    Special: "Crack" cocaine     Comment: none since 02/04/13  . Sexual Activity: Yes   Other Topics Concern  . Not on file   Social History Narrative   Lives with mom, sisters and brothers.    REVIEW OF SYSTEMS: Constitutional: No fevers, chills, or sweats, no generalized fatigue, change in appetite Eyes: No visual changes, double vision, eye pain Ear, nose and throat: No hearing loss, ear pain, nasal congestion, sore throat Cardiovascular: No chest pain, palpitations Respiratory:  No shortness of breath at rest or with exertion, wheezes GastrointestinaI: No nausea, vomiting, diarrhea, abdominal pain, fecal incontinence Genitourinary:  No dysuria, urinary retention or frequency Musculoskeletal:  No neck pain, back pain Integumentary: No rash, pruritus, skin lesions Neurological: as above Psychiatric: No depression, insomnia, anxiety Endocrine: No palpitations, fatigue, diaphoresis, mood swings, change in appetite, change in weight, increased thirst Hematologic/Lymphatic:  No anemia, purpura, petechiae. Allergic/Immunologic: no itchy/runny eyes, nasal congestion, recent allergic reactions, rashes  PHYSICAL EXAM: Filed Vitals:   10/04/13 0830  BP: 110/78  Pulse: 84   General: No acute distress Head:  Normocephalic/atraumatic. No temporal artery tenderness or ropiness Eyes: Fundoscopic exam shows bilateral sharp discs, no vessel changes, exudates, or hemorrhages Neck: supple, no paraspinal tenderness, full range of motion Back: No paraspinal tenderness Heart: regular rate and rhythm Lungs: Clear to auscultation bilaterally. Vascular: No carotid bruits. Skin/Extremities: No rash, no edema. Scar on dorsum of right foot Neurological Exam: Mental status: alert and oriented to person, place, and time, no dysarthria or aphasia, Fund of knowledge is appropriate.  Recent and remote  memory are intact.  Attention and concentration are normal.    Able to name objects and repeat phrases. Cranial nerves: CN I: not tested CN II: pupils equal, round and reactive to light, visual fields intact, fundi unremarkable. CN III, IV, VI:  full range of motion, no nystagmus, no ptosis CN V: facial sensation  intact CN VII: upper and lower face symmetric CN VIII: hearing intact to finger rub CN IX, X: gag intact, uvula midline CN XI: sternocleidomastoid and trapezius muscles intact CN XII: tongue midline Bulk & Tone: normal, no fasciculations. Motor: 5/5 throughout with no pronator drift. Sensation: intact to light touch, cold, pin, vibration and joint position sense.  No extinction to double simultaneous stimulation.  Romberg test none Deep Tendon Reflexes: brisk +3 throughout with bilateral Hoffman's sign, hyperactive pectoralis reflex, and 3-beats unsustained clonus on right ankle, 2-beats on left ankle.   Plantar responses: downgoing bilaterally Cerebellar: no incoordination on finger to nose, heel to shin. No dysdiadochokinesia Gait: narrow-based and steady, able to tandem walk adequately. Tremor: none  IMPRESSION: This is a pleasant 48 year old right-handed woman with a history of hypertension, hyperlipidemia, syphilis, cocaine abuse (last use 01/2013), left pontine stroke in 2009 with intracranial atherosclerosis and white matter changes advanced for age noted at that time, presenting with new onset daily headaches and an episode suggestive of expressive aphasia with slurred speech last 08/06/13, concerning for TIA.  MRI brain with and without contrast and MRA head will be ordered, check HbA1c, lipid panel, RPR.  Continue aspirin and control of vascular risk factors.  Her exam also shows hyperreflexia, MRI cervical spine will be ordered to assess for myelopathy.  Headaches may be tension-type headaches, less likely temporal arteritis. Check ESR. She was advised to take prn Naproxen,  and knows to minimize use to 2-3/week to avoid rebound headaches.  She will follow-up in 1 month.  Thank you for allowing me to participate in the care of this patient. Please do not hesitate to call for any questions or concerns.   Ellouise Newer, M.D.

## 2013-10-04 NOTE — Patient Instructions (Addendum)
1. MRI brain with and without contrast 2. MRA head without contrast 3. MRI cervical spine without contrast 4. Bloodwork for ESR, RPR, HbA1c, lipid panel 5. May use Aleve as needed for headache, do not use more than 2-3 a week to avoid rebound headaches

## 2013-10-06 ENCOUNTER — Telehealth: Payer: Self-pay | Admitting: Hematology and Oncology

## 2013-10-06 ENCOUNTER — Other Ambulatory Visit: Payer: Self-pay

## 2013-10-06 ENCOUNTER — Encounter: Payer: Self-pay | Admitting: Hematology and Oncology

## 2013-10-06 ENCOUNTER — Other Ambulatory Visit (HOSPITAL_BASED_OUTPATIENT_CLINIC_OR_DEPARTMENT_OTHER): Payer: No Typology Code available for payment source

## 2013-10-06 ENCOUNTER — Ambulatory Visit (HOSPITAL_BASED_OUTPATIENT_CLINIC_OR_DEPARTMENT_OTHER): Payer: No Typology Code available for payment source | Admitting: Hematology and Oncology

## 2013-10-06 VITALS — BP 93/68 | HR 76 | Temp 97.9°F | Resp 18 | Ht 66.0 in | Wt 191.7 lb

## 2013-10-06 DIAGNOSIS — D539 Nutritional anemia, unspecified: Secondary | ICD-10-CM

## 2013-10-06 DIAGNOSIS — D72819 Decreased white blood cell count, unspecified: Secondary | ICD-10-CM

## 2013-10-06 DIAGNOSIS — I5032 Chronic diastolic (congestive) heart failure: Secondary | ICD-10-CM

## 2013-10-06 LAB — CBC WITH DIFFERENTIAL/PLATELET
BASO%: 0.5 % (ref 0.0–2.0)
BASOS ABS: 0 10*3/uL (ref 0.0–0.1)
EOS ABS: 0.1 10*3/uL (ref 0.0–0.5)
EOS%: 2.7 % (ref 0.0–7.0)
HCT: 31.9 % — ABNORMAL LOW (ref 34.8–46.6)
HEMOGLOBIN: 10.3 g/dL — AB (ref 11.6–15.9)
LYMPH%: 56.2 % — ABNORMAL HIGH (ref 14.0–49.7)
MCH: 24.8 pg — ABNORMAL LOW (ref 25.1–34.0)
MCHC: 32.3 g/dL (ref 31.5–36.0)
MCV: 76.9 fL — AB (ref 79.5–101.0)
MONO#: 0.6 10*3/uL (ref 0.1–0.9)
MONO%: 32.4 % — AB (ref 0.0–14.0)
NEUT#: 0.2 10*3/uL — CL (ref 1.5–6.5)
NEUT%: 8.2 % — ABNORMAL LOW (ref 38.4–76.8)
PLATELETS: 175 10*3/uL (ref 145–400)
RBC: 4.15 10*6/uL (ref 3.70–5.45)
RDW: 17.4 % — AB (ref 11.2–14.5)
WBC: 1.9 10*3/uL — ABNORMAL LOW (ref 3.9–10.3)
lymph#: 1 10*3/uL (ref 0.9–3.3)

## 2013-10-06 MED ORDER — POTASSIUM CHLORIDE CRYS ER 10 MEQ PO TBCR: 10.0000 meq | EXTENDED_RELEASE_TABLET | Freq: Two times a day (BID) | ORAL | Status: DC

## 2013-10-06 NOTE — Progress Notes (Signed)
[name] OFFICE PROGRESS NOTE  Morgan Chessman, MD SUMMARY OF HEMATOLOGIC HISTORY: [name] who is being referred here because of background of severe pancytopenia, lymphadenopathy and abnormal lymph node and bone marrow biopsy. Further evaluation suspect the patient may have syphilis. She was treated with doxycycline with worsening pancytopenia. Her antibiotic treatment was discontinued. From 06/02/2013 to 06/09/2013, the patient was admitted to the hospital for further workup for neutropenic fever. She was placed on G-CSF with no response to therapy. Repeat bone marrow biopsy was done. The patient was given broad-spectrum intravenous antibiotics and subsequently discharged for further followup as an outpatient.  On 06/14/2013, I started her on prednisone 60 mg daily. She was readmitted to the hospital again for neutropenic fever. 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 On 08/04/2013, I advised further prednisone taper. She is given G-CSF for severe neutropenia. INTERVAL HISTORY: Morgan Bean 48 y.o. female returns for further followup. She feels well. Denies recent infection. She denies any recent fever, chills, night sweats or abnormal weight loss The patient denies any recent signs or symptoms of bleeding such as spontaneous epistaxis, hematuria or hematochezia. Her energy level is fair. She denies recurrence of genital lesions. Her main complaint is headaches. She is seeing a neurologist for this. I have reviewed the past medical history, past surgical history, social history and family history with the patient and they are unchanged from previous note.  ALLERGIES:  is allergic to ciprofloxacin.  MEDICATIONS:  Current Outpatient Prescriptions  Medication Sig Dispense Refill  . albuterol (PROVENTIL) (2.5 MG/3ML) 0.083% nebulizer solution Take 2.5 mg by nebulization every 4 (four) hours as  needed for wheezing or shortness of breath.      [name] amLODipine (NORVASC) 10 MG tablet TAKE ONE TABLET DAILY  30 tablet  5  . aspirin 81 MG tablet Take 81 mg by mouth daily.      [name] atorvastatin (LIPITOR) 40 MG tablet Take 1 tablet (40 mg total) by mouth every evening.  30 tablet  6  . buPROPion (WELLBUTRIN) 100 MG tablet Take 100 mg by mouth 2 (two) times daily.      . citalopram (CELEXA) 40 MG tablet Take 1 tablet (40 mg total) by mouth daily.  30 tablet  1  . cloNIDine (CATAPRES) 0.3 MG tablet Take 1 tablet (0.3 mg total) by mouth 2 (two) times daily.  60 tablet  3  . ferrous sulfate 325 (65 FE) MG tablet Take 650 mg by mouth daily with breakfast.      . furosemide (LASIX) 40 MG tablet TAKE ONE-HALF TABLET TWICE DAILY  30 tablet  2  . hydrOXYzine (ATARAX/VISTARIL) 25 MG tablet Take 25 mg by mouth at bedtime as needed (sleep).      . loratadine (CLARITIN) 10 MG tablet Take 1 tablet (10 mg total) by mouth daily.  30 tablet  3  . Multiple Vitamin (MULTIVITAMIN WITH MINERALS) TABS Take 1 tablet by mouth daily.      [name] omeprazole (PRILOSEC) 40 MG capsule Take 40 mg by mouth daily.      . pegfilgrastim (NEULASTA) 6 MG/0.6ML injection Inject 6 mg into the skin every 14 (fourteen) days.      . polyethylene glycol powder (GLYCOLAX/MIRALAX) powder Take 17 g by mouth daily as needed (constipation).       . potassium chloride (K-DUR,KLOR-CON) 10 MEQ tablet Take 1 tablet (10 mEq total) by mouth 2 (two) times daily with a  meal.  60 tablet  11  . traZODone (DESYREL) 100 MG tablet Take 1 tablet (100 mg total) by mouth at bedtime.  30 tablet  1   No current facility-administered medications for this visit.     REVIEW OF SYSTEMS:   Constitutional: Denies fevers, chills or night sweats Eyes: Denies blurriness of vision Ears, nose, mouth, throat, and face: Denies mucositis or sore throat Respiratory: Denies cough, dyspnea or wheezes Cardiovascular: Denies palpitation, chest discomfort or lower extremity  swelling Gastrointestinal:  Denies nausea, heartburn or change in bowel habits Skin: Denies abnormal skin rashes Lymphatics: Denies new lymphadenopathy or easy bruising Neurological:Denies numbness, tingling or new weaknesses Behavioral/Psych: Mood is stable, no new changes  All other systems were reviewed with the patient and are negative.  PHYSICAL EXAMINATION: ECOG PERFORMANCE STATUS: 0 - Asymptomatic  Filed Vitals:   10/06/13 0946  BP: 93/68  Pulse: 76  Temp: 97.9 F (36.6 C)  Resp: 18   Filed Weights   10/06/13 0946  Weight: 191 lb 11.2 oz (86.955 kg)    GENERAL:alert, no distress and comfortable SKIN: skin color, texture, turgor are normal, no rashes or significant lesions EYES: normal, Conjunctiva are pale and non-injected, sclera clear OROPHARYNX:no exudate, no erythema and lips, buccal mucosa, and tongue normal  NECK: supple, thyroid normal size, non-tender, without nodularity LYMPH:  no palpable lymphadenopathy in the cervical, axillary or inguinal LUNGS: clear to auscultation and percussion with normal breathing effort HEART: regular rate & rhythm and no murmurs and no lower extremity edema ABDOMEN:abdomen soft, non-tender and normal bowel sounds Musculoskeletal:no cyanosis of digits and no clubbing  NEURO: alert & oriented x 3 with fluent speech, no focal motor/sensory deficits  LABORATORY DATA:  I have reviewed the data as listed Results for orders placed in visit on 10/06/13 (from the past 48 hour(s))  CBC WITH DIFFERENTIAL     Status: Abnormal   Collection Time    10/06/13  9:36 AM      Result Value Ref Range   WBC 1.9 (*) 3.9 - 10.3 10e3/uL   NEUT# 0.2 (*) 1.5 - 6.5 10e3/uL   HGB 10.3 (*) 11.6 - 15.9 g/dL   HCT 31.9 (*) 34.8 - 46.6 %   Platelets 175  145 - 400 10e3/uL   MCV 76.9 (*) 79.5 - 101.0 fL   MCH 24.8 (*) 25.1 - 34.0 pg   MCHC 32.3  31.5 - 36.0 g/dL   RBC 4.15  3.70 - 5.45 10e6/uL   RDW 17.4 (*) 11.2 - 14.5 %   lymph# 1.0  0.9 - 3.3 10e3/uL    MONO# 0.6  0.1 - 0.9 10e3/uL   Eosinophils Absolute 0.1  0.0 - 0.5 10e3/uL   Basophils Absolute 0.0  0.0 - 0.1 10e3/uL   NEUT% 8.2 (*) 38.4 - 76.8 %   LYMPH% 56.2 (*) 14.0 - 49.7 %   MONO% 32.4 (*) 0.0 - 14.0 %   EOS% 2.7  0.0 - 7.0 %   BASO% 0.5  0.0 - 2.0 %    Lab Results  Component Value Date   WBC 1.9* 10/06/2013   HGB 10.3* 10/06/2013   HCT 31.9* 10/06/2013   MCV 76.9* 10/06/2013   PLT 175 10/06/2013    ASSESSMENT & PLAN:  Leukopenia This is thought to be related to cocaine abuse. The patient had not use cocaine for a while, since November 2014. She is not symptomatic. I would not recommend G-CSF for now. Another possibility would be she may have autoimmune  condition. She responded to chronic prednisone therapy. Due to lack of symptoms, we have made an informed decision not to pursue additional prednisone therapy.    Unspecified deficiency anemia She had mild iron deficiency in the past and is on iron replacement therapy. I recommend she continues taking oral iron supplement twice a day and we will recheck iron studies in 3 months.    All questions were answered. The patient knows to call the clinic with any problems, questions or concerns. No barriers to learning was detected.  I spent 15 minutes counseling the patient face to face. The total time spent in the appointment was 20 minutes and more than 50% was on counseling.     Gann, [place], MD 10/06/2013 10:09 AM

## 2013-10-06 NOTE — Telephone Encounter (Signed)
gv pt appt schedule for oct.  °

## 2013-10-06 NOTE — Assessment & Plan Note (Signed)
This is thought to be related to cocaine abuse. The patient had not use cocaine for a while, since November 2014. She is not symptomatic. I would not recommend G-CSF for now. Another possibility would be she may have autoimmune condition. She responded to chronic prednisone therapy. Due to lack of symptoms, we have made an informed decision not to pursue additional prednisone therapy.

## 2013-10-06 NOTE — Assessment & Plan Note (Signed)
She had mild iron deficiency in the past and is on iron replacement therapy. I recommend she continues taking oral iron supplement twice a day and we will recheck iron studies in 3 months.

## 2013-10-17 ENCOUNTER — Ambulatory Visit (HOSPITAL_COMMUNITY)
Admission: RE | Admit: 2013-10-17 | Discharge: 2013-10-17 | Disposition: A | Discharge status: Home / Self Care | Payer: No Typology Code available for payment source | Source: Ambulatory Visit | Attending: Neurology | Admitting: Neurology

## 2013-10-17 DIAGNOSIS — F1410 Cocaine abuse, uncomplicated: Secondary | ICD-10-CM | POA: Insufficient documentation

## 2013-10-17 DIAGNOSIS — M5380 Other specified dorsopathies, site unspecified: Secondary | ICD-10-CM | POA: Insufficient documentation

## 2013-10-17 DIAGNOSIS — R29898 Other symptoms and signs involving the musculoskeletal system: Secondary | ICD-10-CM | POA: Insufficient documentation

## 2013-10-17 DIAGNOSIS — R51 Headache: Secondary | ICD-10-CM | POA: Insufficient documentation

## 2013-10-17 DIAGNOSIS — M47812 Spondylosis without myelopathy or radiculopathy, cervical region: Secondary | ICD-10-CM | POA: Insufficient documentation

## 2013-10-17 DIAGNOSIS — G459 Transient cerebral ischemic attack, unspecified: Secondary | ICD-10-CM | POA: Insufficient documentation

## 2013-10-17 DIAGNOSIS — J387 Other diseases of larynx: Secondary | ICD-10-CM | POA: Insufficient documentation

## 2013-10-17 DIAGNOSIS — R292 Abnormal reflex: Secondary | ICD-10-CM | POA: Insufficient documentation

## 2013-10-17 DIAGNOSIS — I2510 Atherosclerotic heart disease of native coronary artery without angina pectoris: Secondary | ICD-10-CM | POA: Insufficient documentation

## 2013-10-17 DIAGNOSIS — I5032 Chronic diastolic (congestive) heart failure: Secondary | ICD-10-CM | POA: Insufficient documentation

## 2013-10-17 DIAGNOSIS — R4789 Other speech disturbances: Secondary | ICD-10-CM | POA: Insufficient documentation

## 2013-10-17 DIAGNOSIS — G319 Degenerative disease of nervous system, unspecified: Secondary | ICD-10-CM | POA: Insufficient documentation

## 2013-10-17 DIAGNOSIS — M5020 Other cervical disc displacement, unspecified cervical region: Secondary | ICD-10-CM | POA: Insufficient documentation

## 2013-10-17 MED ORDER — GADOBENATE DIMEGLUMINE 529 MG/ML IV SOLN
18.0000 mL | Freq: Once | INTRAVENOUS | Status: AC | PRN
  Administered 2013-10-17: 18 mL via INTRAVENOUS

## 2013-10-20 ENCOUNTER — Telehealth: Payer: Self-pay | Admitting: Neurology

## 2013-10-20 DIAGNOSIS — G458 Other transient cerebral ischemic attacks and related syndromes: Secondary | ICD-10-CM

## 2013-10-20 MED ORDER — CLOPIDOGREL BISULFATE 75 MG PO TABS: 75.0000 mg | ORAL_TABLET | Freq: Every day | ORAL | Status: DC

## 2013-10-20 NOTE — Telephone Encounter (Signed)
Discussed MRI/MRA findings, there is moderate white matter change bilaterally, no acute ischemia. Her MRA shows diffuse intracranial atherosclerosis, lipid profile normal, likely due to cocaine use history. She had transient aphasia while on aspirin, would switch to Plavix. Side effects discussed. Rx sent to pharmacy, stop aspirin.

## 2013-10-27 ENCOUNTER — Other Ambulatory Visit: Payer: No Typology Code available for payment source

## 2013-10-30 ENCOUNTER — Other Ambulatory Visit (INDEPENDENT_AMBULATORY_CARE_PROVIDER_SITE_OTHER): Payer: No Typology Code available for payment source

## 2013-10-30 DIAGNOSIS — E785 Hyperlipidemia, unspecified: Secondary | ICD-10-CM

## 2013-10-30 LAB — LIPID PANEL
CHOLESTEROL: 162 mg/dL (ref 0–200)
HDL: 32.8 mg/dL — ABNORMAL LOW (ref >39.00)
LDL CALC: 105 mg/dL — AB (ref 0–99)
NONHDL: 129.2
Total CHOL/HDL Ratio: 5
Triglycerides: 119 mg/dL (ref 0.0–149.0)
VLDL: 23.8 mg/dL (ref 0.0–40.0)

## 2013-11-01 ENCOUNTER — Ambulatory Visit: Payer: No Typology Code available for payment source | Admitting: Neurology

## 2013-11-06 ENCOUNTER — Other Ambulatory Visit: Payer: Self-pay | Admitting: *Deleted

## 2013-11-10 ENCOUNTER — Ambulatory Visit (INDEPENDENT_AMBULATORY_CARE_PROVIDER_SITE_OTHER): Payer: Self-pay | Admitting: Neurology

## 2013-11-10 ENCOUNTER — Telehealth: Payer: Self-pay | Admitting: *Deleted

## 2013-11-10 ENCOUNTER — Telehealth: Payer: Self-pay | Admitting: Hematology and Oncology

## 2013-11-10 ENCOUNTER — Encounter: Payer: Self-pay | Admitting: Neurology

## 2013-11-10 ENCOUNTER — Other Ambulatory Visit: Payer: Self-pay | Admitting: Hematology and Oncology

## 2013-11-10 VITALS — BP 110/52 | HR 108 | Resp 20 | Ht 66.0 in | Wt 185.0 lb

## 2013-11-10 DIAGNOSIS — I672 Cerebral atherosclerosis: Secondary | ICD-10-CM

## 2013-11-10 DIAGNOSIS — D72819 Decreased white blood cell count, unspecified: Secondary | ICD-10-CM

## 2013-11-10 DIAGNOSIS — D539 Nutritional anemia, unspecified: Secondary | ICD-10-CM

## 2013-11-10 DIAGNOSIS — R5382 Chronic fatigue, unspecified: Secondary | ICD-10-CM

## 2013-11-10 DIAGNOSIS — G459 Transient cerebral ischemic attack, unspecified: Secondary | ICD-10-CM

## 2013-11-10 DIAGNOSIS — R5383 Other fatigue: Secondary | ICD-10-CM | POA: Insufficient documentation

## 2013-11-10 DIAGNOSIS — R51 Headache: Secondary | ICD-10-CM

## 2013-11-10 NOTE — Telephone Encounter (Signed)
Pt called and says she "feels like her numbers are down, feeling really bad, tired, energy gone". Would like to know what to do. Next appt with Dr Alvy Bimler is not until October 16.

## 2013-11-10 NOTE — Patient Instructions (Signed)
1. Continue daily Plavix 2. Monitor headaches, call our office for any change 3. Discuss sleep difficulties with your primary care doctor 4. If symptoms change, go to ER immediately 5. Follow-up in 6 months

## 2013-11-10 NOTE — Telephone Encounter (Signed)
s.w. pt and advised on Aug appt....pt ok and aware °

## 2013-11-10 NOTE — Progress Notes (Signed)
NEUROLOGY FOLLOW UP OFFICE NOTE  Morgan Bean [id number]  HISTORY OF PRESENT ILLNESS: I had the pleasure of seeing Morgan Bean in follow-up in the neurology clinic on 11/10/48.  The patient was last seen a month ago for increased headaches.  She is accompanied by her mother today.  Records and images were personally reviewed where available.  I personally reviewed MRI brain with and without contrast done last month which showed generalized cerebral atrophy, advanced for patient age, extensive T2/FLAIR hyperintensity within the periventricular and deep white  matter both cerebral hemispheres. There is a remote lacunar infarct in the white matter of the corona radiata of both cerebral hemispheres, remote lacunar  infarct present within the left pons. Her MRA head was abnormal with multifocal atherosclerotic stenosis in the cavernous right ICA, measuring up to 50-60% by NASCET criteria, short segment stenosis of approximately 30-40% within the pre cavernous left ICA, smooth atherosclerotic stenosis of approximately 30-40% within  the cavernous left ICA, additional widespread multi focal intracranial atherosclerosis, predominantly involving the in M1 segments and basilar artery. Her MRI C-spine did not show any myelopathy, there was spondylosis with mild to moderate foraminal narrowing at C5-6 bilaterally.  There was note of persistent upper cervical adenopathy and mucosal edema with heterogeneous enhancement, appearing slightly improved and presumably inflammatory.   With the MRA findings of intracranial stenosis, I had called the patient and switched from aspirin to Plavix due to possible TIA last 07/2013 with word-finding difficulties and slurred speech.  She returns today appearing tired and short of breath, however denies any dyspnea. She states she feels run down and feels her "numbers are low."  She has only been sleeping 2-3 hours at night.  She states her headaches are not that often, she only  had one last week.  She denies any focal numbness/tingling/weakness, no dizziness or further episodes of slurred speech or word-finding difficulties.  HPI:  This is a pleasant 48 yo RH woman with a history of hypertension, hyperlipidemia, left pontine stroke in 2009, leukopenia and anemia, history of cocaine abuse (last use November 2014), syphilis, in her usual state of health until a few months ago when she started having occasional headaches that have increased in frequency to twice daily. She presented to the ER on 08/06/13 for 10/10 headache and speech difficulties where she reported word-finding difficulties (per daughter "not making sense") because she couldn't say the right word and it took a long time to get a word out. Her speech was also slurred. She denied any difficulties with comprehension. CBC, CMP, and head CT done were unremarkable. She was discharged home to follow-up with Neurology. She reports the speech difficulties lasted for 24 hours. She continues to have daily headaches occuring twice a day, lasting 5 minutes or so, 3/10 in intensity, with throbbing over either temporal region, or a cap-like pressure over the top of her head. She does not take any medication, stating the headaches are not bad, but are concerning because she has never had daily headaches in the past. There is no associated nausea, vomiting, photo/phonophobia, no focal numbness/tingling/weakness. She denies any neck pain, no visual changes, no diplopia, dizziness, dysphagia, bowel/bladder dysfunction. She has occasional right low back pain. She endorses some stress with family issues. She usually gets 5-6 hours of refreshing sleep with Trazodone. She takes Celexa and Wellbutrin for depression and "energy."   In 2009, she suddenly fell and noted right hand weakness and slurred speech. I personally reviewed MRI brain from 2009 which  showed restricted diffusion in the left pons and inferior mid brain without mass effect or  hemorrhage. There was moderate white matter disease advanced for age. It was noted that the main differential  diagnosis in a patient of this age is multiple sclerosis. Her MRA showed widespread intracranial atherosclerosis, basilar tip is ectatic measuring roughly 3 x 6.5 mm, without saccular aneurysm formation, mild to moderate stenoses of the left MCA origin, right MCA trifurcation and left ICA pre cavernous segment are noted. She was discharged on aspirin. She had left arm pain last year and tells me she was started on Lipitor at that time. She stopped smoking in March 2015 after smoking 1/2 PPD since age 2. There is no family history of headaches or aneurysm.  PAST MEDICAL HISTORY: Past Medical History  Diagnosis Date  . Coronary artery disease     Lexiscan myoview (10/12) with significant ST depression upon Lexiscan injection but no ischemia or infarction on perfusion images. Left heart cath (10/12): 30% mid LAD, 30% ostial D1, 50% ostial D2.    [name] Hypertension   . Tricuspid regurgitation   . Iron deficiency     hx of  . Polysubstance abuse     Prior cocaine  . Tobacco abuse   . Leukocytoclastic vasculitis     Rash across lower body, occurred in 9/11, diagnosed by skin biopsy. ANA positive. Thought to be secondary to cocaine use.  . Pulmonary HTN     Echo (9/11) with EF 65%, mild LVH, mild AI, mild MR, moderate to possibly severe TR with PA systolic pressure 52 mmHg. Echo (10/12): severe LV hypertrophy, EF 60-65%, mild MR, mild AI, moderate to severe tricuspid regurgitation, PA systolic pressure 38 mmHg.  Echo 4/14: moderate LVH, EF 65%, normal wall motion, diastolic dysfunction, mild AI, mild MR  . Asthma   . Shortness of breath     a. PFTs 5/14:  FEV1/FVC 99% predicted; FVC 60% predicted; DLCO mildly reduced, mild restriction, air trapping.;  b.  seen by pulmo (Dr. [name]) 5/14: mainly upper airway symptoms - ACE d/c'd (sx's better)  . Chronic diastolic CHF (congestive heart failure)     . Constipation   . Lymphadenopathy 03/21/2013  . Leukopenia 03/21/2013  . Unspecified deficiency anemia 03/29/2013  . Anginal pain     none in past year  . Anxiety   . Mental disorder   . Depression   . Stroke 2010ish  . Arthritis   . Pancytopenia   . Hyperlipemia   . Syphilis 04/25/2013  . Candidiasis 06/23/2013    MEDICATIONS: Current Outpatient Prescriptions on File Prior to Visit  Medication Sig Dispense Refill  . albuterol (PROVENTIL) (2.5 MG/3ML) 0.083% nebulizer solution Take 2.5 mg by nebulization every 4 (four) hours as needed for wheezing or shortness of breath.      [name] amLODipine (NORVASC) 10 MG tablet TAKE ONE TABLET DAILY  30 tablet  5  . atorvastatin (LIPITOR) 40 MG tablet Take 1 tablet (40 mg total) by mouth every evening.  30 tablet  6  . buPROPion (WELLBUTRIN) 100 MG tablet Take 100 mg by mouth 2 (two) times daily.      . citalopram (CELEXA) 40 MG tablet Take 1 tablet (40 mg total) by mouth daily.  30 tablet  1  . cloNIDine (CATAPRES) 0.3 MG tablet Take 1 tablet (0.3 mg total) by mouth 2 (two) times daily.  60 tablet  3  . clopidogrel (PLAVIX) 75 MG tablet Take 1 tablet (75 mg total) by  mouth daily.  30 tablet  11  . ferrous sulfate 325 (65 FE) MG tablet Take 650 mg by mouth daily with breakfast.      . furosemide (LASIX) 40 MG tablet TAKE ONE-HALF TABLET TWICE DAILY  30 tablet  2  . hydrOXYzine (ATARAX/VISTARIL) 25 MG tablet Take 25 mg by mouth at bedtime as needed (sleep).      . loratadine (CLARITIN) 10 MG tablet Take 1 tablet (10 mg total) by mouth daily.  30 tablet  3  . Multiple Vitamin (MULTIVITAMIN WITH MINERALS) TABS Take 1 tablet by mouth daily.      [name] omeprazole (PRILOSEC) 40 MG capsule Take 40 mg by mouth daily.      . polyethylene glycol powder (GLYCOLAX/MIRALAX) powder Take 17 g by mouth daily as needed (constipation).       . potassium chloride (K-DUR,KLOR-CON) 10 MEQ tablet Take 1 tablet (10 mEq total) by mouth 2 (two) times daily with a meal.  60 tablet   3  . traZODone (DESYREL) 100 MG tablet Take 1 tablet (100 mg total) by mouth at bedtime.  30 tablet  1   No current facility-administered medications on file prior to visit.    ALLERGIES: Allergies  Allergen Reactions  . Ciprofloxacin Itching    FAMILY HISTORY: Family History  Problem Relation Age of Onset  . Coronary artery disease Mother   . Diabetes Mother   . Breast cancer Maternal Grandmother   . Diabetes Maternal Grandmother   . Diabetes Father   . Hypertension Father   . Coronary artery disease Father     SOCIAL HISTORY: History   Social History  . Marital Status: Single    Spouse Name: N/A    Number of Children: 2  . Years of Education: N/A   Occupational History  . unemployed    Social History Main Topics  . Smoking status: Former Smoker -- 0.25 packs/day for 30 years    Types: Cigarettes    Quit date: 05/23/2013  . Smokeless tobacco: Never Used  . Alcohol Use: No     Comment: pint of wine 2-3 times a week--ocassional  . Drug Use: 1.00 per week    Special: "Crack" cocaine     Comment: none since 02/04/13  . Sexual Activity: Yes   Other Topics Concern  . Not on file   Social History Narrative   Lives with mom, sisters and brothers.    REVIEW OF SYSTEMS: Constitutional: No fevers, chills, or sweats, + generalized fatigue,no change in appetite Eyes: No visual changes, double vision, eye pain Ear, nose and throat: No hearing loss, ear pain, nasal congestion, sore throat Cardiovascular: No chest pain, palpitations Respiratory:  No shortness of breath at rest or with exertion, wheezes GastrointestinaI: No nausea, vomiting, diarrhea, abdominal pain, fecal incontinence Genitourinary:  No dysuria, urinary retention or frequency Musculoskeletal:  No neck pain, back pain Integumentary: No rash, pruritus, skin lesions Neurological: as above Psychiatric: No depression, + insomnia, no anxiety Endocrine: No palpitations, fatigue, diaphoresis, mood swings,  change in appetite, change in weight, increased thirst Hematologic/Lymphatic:  No anemia, purpura, petechiae. Allergic/Immunologic: no itchy/runny eyes, nasal congestion, recent allergic reactions, rashes  PHYSICAL EXAM: Filed Vitals:   11/10/13 1438  BP: 110/52  Pulse: 108  Resp: 20   General: No acute distress Head:  Normocephalic/atraumatic Neck: supple, no paraspinal tenderness, full range of motion Heart:  Regular rate and rhythm Lungs:  Clear to auscultation bilaterally Back: No paraspinal tenderness Skin/Extremities: No rash, no edema  Neurological Exam: alert and oriented to person, place, and time, no dysarthria or aphasia, Fund of knowledge is appropriate. Recent and remote memory are intact. Attention and concentration are normal. Able to name objects and repeat phrases.  Cranial nerves:  CN I: not tested  CN II: pupils equal, round and reactive to light, visual fields intact, fundi unremarkable.  CN III, IV, VI: full range of motion, no nystagmus, no ptosis  CN V: facial sensation intact  CN VII: upper and lower face symmetric  CN VIII: hearing intact to finger rub  CN IX, X: gag intact, uvula midline  CN XI: sternocleidomastoid and trapezius muscles intact  CN XII: tongue midline  Bulk & Tone: normal, no fasciculations.  Motor: 5/5 throughout with no pronator drift.  Sensation: intact to light touch. No extinction to double simultaneous stimulation. Romberg test negative Deep Tendon Reflexes: brisk +3 throughout with bilateral Hoffman's sign, hyperactive pectoralis reflex, and 3-beats unsustained clonus on right ankle, 2-beats on left ankle.  Plantar responses: downgoing bilaterally  Cerebellar: no incoordination on finger to nose Gait: narrow-based and steady, slight difficulty with tandem walk but able Tremor: none   IMPRESSION:  This is a pleasant 48 yo RH woman with a history of hypertension, hyperlipidemia, syphilis, cocaine abuse (last use 01/2013), left  pontine stroke in 2009 with intracranial atherosclerosis and white matter changes advanced for age again noted.  She presented for new onset daily headaches and an episode suggestive of expressive aphasia with slurred speech last 08/06/13, concerning for TIA.  No acute changes on MRI.  She reports headaches have decreased. She is now on Plavix for secondary stroke prevention and will continue to monitor and control vascular risk factors. She feels run down today and will speak to her oncologist.  We discussed sleep hygiene and relation to headaches, she does not want to start medication at this time, consideration for nortriptyline or amitriptyline will be done in the future if headaches worsen.  She knows to call our office for any problems and will follow-up in 6 months or earlier if needed.  Thank you for allowing me to participate in her care.  Please do not hesitate to call for any questions or concerns.  The duration of this appointment visit was 15 minutes of face-to-face time with the patient.  Greater than 50% of this time was spent in counseling, explanation of diagnosis, planning of further management, and coordination of care.   Ellouise Newer, M.D.   CC: Dr. Doreene Burke

## 2013-11-13 ENCOUNTER — Ambulatory Visit (HOSPITAL_COMMUNITY)
Admission: RE | Admit: 2013-11-13 | Discharge: 2013-11-13 | Disposition: A | Discharge status: Home / Self Care | Payer: No Typology Code available for payment source | Source: Ambulatory Visit | Attending: Hematology and Oncology | Admitting: Hematology and Oncology

## 2013-11-13 ENCOUNTER — Other Ambulatory Visit (HOSPITAL_BASED_OUTPATIENT_CLINIC_OR_DEPARTMENT_OTHER): Payer: No Typology Code available for payment source

## 2013-11-13 ENCOUNTER — Ambulatory Visit (HOSPITAL_BASED_OUTPATIENT_CLINIC_OR_DEPARTMENT_OTHER): Payer: No Typology Code available for payment source | Admitting: Hematology and Oncology

## 2013-11-13 ENCOUNTER — Encounter: Payer: Self-pay | Admitting: Hematology and Oncology

## 2013-11-13 ENCOUNTER — Telehealth: Payer: Self-pay | Admitting: Hematology and Oncology

## 2013-11-13 VITALS — BP 106/75 | HR 98 | Temp 98.3°F | Resp 18 | Ht 66.0 in | Wt 184.3 lb

## 2013-11-13 DIAGNOSIS — R059 Cough, unspecified: Secondary | ICD-10-CM | POA: Insufficient documentation

## 2013-11-13 DIAGNOSIS — D638 Anemia in other chronic diseases classified elsewhere: Secondary | ICD-10-CM | POA: Insufficient documentation

## 2013-11-13 DIAGNOSIS — Z720 Tobacco use: Secondary | ICD-10-CM

## 2013-11-13 DIAGNOSIS — F17210 Nicotine dependence, cigarettes, uncomplicated: Secondary | ICD-10-CM | POA: Insufficient documentation

## 2013-11-13 DIAGNOSIS — D709 Neutropenia, unspecified: Secondary | ICD-10-CM

## 2013-11-13 DIAGNOSIS — D72819 Decreased white blood cell count, unspecified: Secondary | ICD-10-CM

## 2013-11-13 DIAGNOSIS — R05 Cough: Secondary | ICD-10-CM

## 2013-11-13 DIAGNOSIS — R5081 Fever presenting with conditions classified elsewhere: Secondary | ICD-10-CM

## 2013-11-13 DIAGNOSIS — D539 Nutritional anemia, unspecified: Secondary | ICD-10-CM

## 2013-11-13 DIAGNOSIS — R221 Localized swelling, mass and lump, neck: Secondary | ICD-10-CM

## 2013-11-13 DIAGNOSIS — F17200 Nicotine dependence, unspecified, uncomplicated: Secondary | ICD-10-CM

## 2013-11-13 DIAGNOSIS — R220 Localized swelling, mass and lump, head: Secondary | ICD-10-CM

## 2013-11-13 DIAGNOSIS — R5382 Chronic fatigue, unspecified: Secondary | ICD-10-CM

## 2013-11-13 HISTORY — DX: Anemia in other chronic diseases classified elsewhere: D63.8

## 2013-11-13 HISTORY — DX: Cough, unspecified: R05.9

## 2013-11-13 LAB — CBC & DIFF AND RETIC
BASO%: 0.6 % (ref 0.0–2.0)
Basophils Absolute: 0 10*3/uL (ref 0.0–0.1)
EOS%: 3.2 % (ref 0.0–7.0)
Eosinophils Absolute: 0.1 10*3/uL (ref 0.0–0.5)
HEMATOCRIT: 29.4 % — AB (ref 34.8–46.6)
HGB: 9.1 g/dL — ABNORMAL LOW (ref 11.6–15.9)
IMMATURE RETIC FRACT: 9 % (ref 1.60–10.00)
LYMPH#: 0.9 10*3/uL (ref 0.9–3.3)
LYMPH%: 57.8 % — AB (ref 14.0–49.7)
MCH: 23.2 pg — AB (ref 25.1–34.0)
MCHC: 31 g/dL — AB (ref 31.5–36.0)
MCV: 74.8 fL — AB (ref 79.5–101.0)
MONO#: 0.5 10*3/uL (ref 0.1–0.9)
MONO%: 30.5 % — AB (ref 0.0–14.0)
NEUT#: 0.1 10*3/uL — CL (ref 1.5–6.5)
NEUT%: 7.9 % — AB (ref 38.4–76.8)
Platelets: 164 10*3/uL (ref 145–400)
RBC: 3.93 10*6/uL (ref 3.70–5.45)
RDW: 17.2 % — ABNORMAL HIGH (ref 11.2–14.5)
Retic %: 0.72 % (ref 0.70–2.10)
Retic Ct Abs: 28.3 10*3/uL — ABNORMAL LOW (ref 33.70–90.70)
WBC: 1.5 10*3/uL — ABNORMAL LOW (ref 3.9–10.3)

## 2013-11-13 LAB — FERRITIN CHCC: FERRITIN: 653 ng/mL — AB (ref 9–269)

## 2013-11-13 LAB — CHCC SMEAR

## 2013-11-13 LAB — COMPREHENSIVE METABOLIC PANEL (CC13)
ALT: 19 U/L (ref 0–55)
AST: 25 U/L (ref 5–34)
Albumin: 2.7 g/dL — ABNORMAL LOW (ref 3.5–5.0)
Alkaline Phosphatase: 131 U/L (ref 40–150)
Anion Gap: 7 mEq/L (ref 3–11)
BUN: 14 mg/dL (ref 7.0–26.0)
CALCIUM: 9 mg/dL (ref 8.4–10.4)
CHLORIDE: 107 meq/L (ref 98–109)
CO2: 22 meq/L (ref 22–29)
Creatinine: 1 mg/dL (ref 0.6–1.1)
GLUCOSE: 109 mg/dL (ref 70–140)
Potassium: 4.1 mEq/L (ref 3.5–5.1)
SODIUM: 137 meq/L (ref 136–145)
Total Bilirubin: 0.36 mg/dL (ref 0.20–1.20)
Total Protein: 8.9 g/dL — ABNORMAL HIGH (ref 6.4–8.3)

## 2013-11-13 LAB — IRON AND TIBC CHCC
%SAT: 20 % — AB (ref 21–57)
IRON: 33 ug/dL — AB (ref 41–142)
TIBC: 164 ug/dL — ABNORMAL LOW (ref 236–444)
UIBC: 131 ug/dL (ref 120–384)

## 2013-11-13 LAB — SEDIMENTATION RATE: Sed Rate: 87 mm/hr — ABNORMAL HIGH (ref 0–22)

## 2013-11-13 LAB — T4, FREE: FREE T4: 1.08 ng/dL (ref 0.80–1.80)

## 2013-11-13 LAB — TSH CHCC: TSH: 1.68 m[IU]/L (ref 0.308–3.960)

## 2013-11-13 LAB — LACTATE DEHYDROGENASE (CC13): LDH: 183 U/L (ref 125–245)

## 2013-11-13 MED ORDER — PREDNISONE 20 MG PO TABS: 40.0000 mg | ORAL_TABLET | Freq: Every day | ORAL | Status: DC

## 2013-11-13 MED ORDER — AMOXICILLIN-POT CLAVULANATE 875-125 MG PO TABS: 1.0000 | ORAL_TABLET | Freq: Two times a day (BID) | ORAL | Status: DC

## 2013-11-13 NOTE — Assessment & Plan Note (Signed)
Clinically, she is not septic. She has prolonged neutropenia and had numerous bone marrow biopsy. I suspect the cause is autoimmune in nature at this point. Previously, she has no response to G-CSF. I would recommend prednisone therapy along with antibiotics with Augmentin. I suspect the source of her fever is probably from oral orgasms especially in view of poor dentition. I will order a chest x-ray for evaluation and exclude pneumonia. She needs to come back next week for blood check and I plan to see her back in approximately 2 weeks' time.

## 2013-11-13 NOTE — Assessment & Plan Note (Signed)
The causes unknown, could be related to infection. I will prescribe a course of antibiotics for now and I recommend improved dental hygiene.

## 2013-11-13 NOTE — Assessment & Plan Note (Signed)
This is likely anemia of chronic disease. The patient denies recent history of bleeding such as epistaxis, hematuria or hematochezia. She is asymptomatic from the anemia. We will observe for now.  She does not require transfusion now.   

## 2013-11-13 NOTE — Progress Notes (Signed)
Morgan Bean  Morgan Chessman, MD SUMMARY OF HEMATOLOGIC HISTORY: [name] who is being referred here because of background of severe pancytopenia, lymphadenopathy and abnormal lymph node and bone marrow biopsy. Further evaluation suspect the patient may have syphilis. She was treated with doxycycline with worsening pancytopenia. Her antibiotic treatment was discontinued. From 06/02/2013 to 06/09/2013, the patient was admitted to the hospital for further workup for neutropenic fever. She was placed on G-CSF with no response to therapy. Repeat bone marrow biopsy was done. The patient was given broad-spectrum intravenous antibiotics and subsequently discharged for further followup as an outpatient.  On 06/14/2013, I started her on prednisone 60 mg daily. She was readmitted to the hospital again for neutropenic fever. 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 On 08/04/2013, I advised further prednisone taper. She is given G-CSF for severe neutropenia with no response. She was subsequently observed. INTERVAL HISTORY: Morgan Bean 48 y.o. female returns for further followup. She complained of fatigue. She has some fevers and chills at home with productive cough of yellowish phlegm. She continues to smoke. She denies recent drug use. She had slight diarrhea last week. She had recent MRI evaluation for mild neurological deficit. She's placed on Plavix. The patient denies any recent signs or symptoms of bleeding such as spontaneous epistaxis, hematuria or hematochezia. She complained of gingival swelling.  I have reviewed the past medical history, past surgical history, social history and family history with the patient and they are unchanged from previous Bean.  ALLERGIES:  is allergic to ciprofloxacin.  MEDICATIONS:  Current Outpatient Prescriptions  Medication Sig Dispense Refill  .  albuterol (PROVENTIL) (2.5 MG/3ML) 0.083% nebulizer solution Take 2.5 mg by nebulization every 4 (four) hours as needed for wheezing or shortness of breath.      [name] amLODipine (NORVASC) 10 MG tablet TAKE ONE TABLET DAILY  30 tablet  5  . atorvastatin (LIPITOR) 40 MG tablet Take 1 tablet (40 mg total) by mouth every evening.  30 tablet  6  . buPROPion (WELLBUTRIN) 100 MG tablet Take 100 mg by mouth 2 (two) times daily.      . citalopram (CELEXA) 40 MG tablet Take 1 tablet (40 mg total) by mouth daily.  30 tablet  1  . cloNIDine (CATAPRES) 0.3 MG tablet Take 1 tablet (0.3 mg total) by mouth 2 (two) times daily.  60 tablet  3  . clopidogrel (PLAVIX) 75 MG tablet Take 1 tablet (75 mg total) by mouth daily.  30 tablet  11  . ferrous sulfate 325 (65 FE) MG tablet Take 650 mg by mouth daily with breakfast.      . furosemide (LASIX) 40 MG tablet TAKE ONE-HALF TABLET TWICE DAILY  30 tablet  2  . hydrOXYzine (ATARAX/VISTARIL) 25 MG tablet Take 25 mg by mouth at bedtime as needed (sleep).      . loratadine (CLARITIN) 10 MG tablet Take 1 tablet (10 mg total) by mouth daily.  30 tablet  3  . Multiple Vitamin (MULTIVITAMIN WITH MINERALS) TABS Take 1 tablet by mouth daily.      [name] omeprazole (PRILOSEC) 40 MG capsule Take 40 mg by mouth daily.      . polyethylene glycol powder (GLYCOLAX/MIRALAX) powder Take 17 g by mouth daily as needed (constipation).       . potassium chloride (K-DUR,KLOR-CON) 10 MEQ tablet Take 1 tablet (10 mEq total) by mouth 2 (two) times daily  with a meal.  60 tablet  3  . traZODone (DESYREL) 100 MG tablet Take 1 tablet (100 mg total) by mouth at bedtime.  30 tablet  1  . amoxicillin-clavulanate (AUGMENTIN) 875-125 MG per tablet Take 1 tablet by mouth 2 (two) times daily.  14 tablet  0  . predniSONE (DELTASONE) 20 MG tablet Take 2 tablets (40 mg total) by mouth daily with breakfast.  90 tablet  1   No current facility-administered medications for this visit.     REVIEW OF SYSTEMS:    Constitutional: Denies fevers, chills or night sweats Eyes: Denies blurriness of vision Ears, nose, mouth, throat, and face: Denies mucositis or sore throat Cardiovascular: Denies palpitation, chest discomfort or lower extremity swelling Skin: Denies abnormal skin rashes Lymphatics: Denies new lymphadenopathy or easy bruising Neurological:Denies numbness, tingling or new weaknesses Behavioral/Psych: Mood is stable, no new changes  All other systems were reviewed with the patient and are negative.  PHYSICAL EXAMINATION: ECOG PERFORMANCE STATUS: 1 - Symptomatic but completely ambulatory  Filed Vitals:   11/13/13 1350  BP: 106/75  Pulse: 98  Temp: 98.3 F (36.8 C)  Resp: 18   Filed Weights   11/13/13 1350  Weight: 184 lb 4.8 oz (83.598 kg)    GENERAL:alert, no distress and comfortable SKIN: skin color, texture, turgor are normal, no rashes or significant lesions EYES: normal, Conjunctiva are pink and non-injected, sclera clear OROPHARYNX: Poor dentition is noted with gum swelling. NECK: supple, thyroid normal size, non-tender, without nodularity LYMPH:  no palpable lymphadenopathy in the cervical, axillary or inguinal LUNGS: clear to auscultation and percussion with normal breathing effort HEART: regular rate & rhythm and no murmurs and no lower extremity edema ABDOMEN:abdomen soft, non-tender and normal bowel sounds Musculoskeletal:no cyanosis of digits and no clubbing  NEURO: alert & oriented x 3 with fluent speech, no focal motor/sensory deficits  LABORATORY DATA:  I have reviewed the data as listed Results for orders placed in visit on 11/13/13 (from the past 48 hour(s))  CBC & DIFF AND RETIC     Status: Abnormal   Collection Time    11/13/13  1:36 PM      Result Value Ref Range   WBC 1.5 (*) 3.9 - 10.3 10e3/uL   NEUT# 0.1 (*) 1.5 - 6.5 10e3/uL   HGB 9.1 (*) 11.6 - 15.9 g/dL   HCT 29.4 (*) 34.8 - 46.6 %   Platelets 164  145 - 400 10e3/uL   MCV 74.8 (*) 79.5 -  101.0 fL   MCH 23.2 (*) 25.1 - 34.0 pg   MCHC 31.0 (*) 31.5 - 36.0 g/dL   RBC 3.93  3.70 - 5.45 10e6/uL   RDW 17.2 (*) 11.2 - 14.5 %   lymph# 0.9  0.9 - 3.3 10e3/uL   MONO# 0.5  0.1 - 0.9 10e3/uL   Eosinophils Absolute 0.1  0.0 - 0.5 10e3/uL   Basophils Absolute 0.0  0.0 - 0.1 10e3/uL   NEUT% 7.9 (*) 38.4 - 76.8 %   LYMPH% 57.8 (*) 14.0 - 49.7 %   MONO% 30.5 (*) 0.0 - 14.0 %   EOS% 3.2  0.0 - 7.0 %   BASO% 0.6  0.0 - 2.0 %   Retic % 0.72  0.70 - 2.10 %   Retic Ct Abs 28.30 (*) 33.70 - 90.70 10e3/uL   Immature Retic Fract 9.00  1.60 - 10.00 %  [facility] SMEAR     Status: None   Collection Time    11/13/13  1:36 PM      Result Value Ref Range   Smear Result Smear Available    COMPREHENSIVE METABOLIC PANEL (VQ00)     Status: Abnormal   Collection Time    11/13/13  1:36 PM      Result Value Ref Range   Sodium 137  136 - 145 mEq/L   Potassium 4.1  3.5 - 5.1 mEq/L   Chloride 107  98 - 109 mEq/L   CO2 22  22 - 29 mEq/L   Glucose 109  70 - 140 mg/dl   BUN 14.0  7.0 - 26.0 mg/dL   Creatinine 1.0  0.6 - 1.1 mg/dL   Total Bilirubin 0.36  0.20 - 1.20 mg/dL   Alkaline Phosphatase 131  40 - 150 U/L   AST 25  5 - 34 U/L   ALT 19  0 - 55 U/L   Total Protein 8.9 (*) 6.4 - 8.3 g/dL   Albumin 2.7 (*) 3.5 - 5.0 g/dL   Calcium 9.0  8.4 - 10.4 mg/dL   Anion Gap 7  3 - 11 mEq/L  LACTATE DEHYDROGENASE (CC13)     Status: None   Collection Time    11/13/13  1:36 PM      Result Value Ref Range   LDH 183  125 - 245 U/L    Lab Results  Component Value Date   WBC 1.5* 11/13/2013   HGB 9.1* 11/13/2013   HCT 29.4* 11/13/2013   MCV 74.8* 11/13/2013   PLT 164 11/13/2013   ASSESSMENT & PLAN:  Neutropenic fever Clinically, she is not septic. She has prolonged neutropenia and had numerous bone marrow biopsy. I suspect the cause is autoimmune in nature at this point. Previously, she has no response to G-CSF. I would recommend prednisone therapy along with antibiotics with Augmentin. I suspect the  source of her fever is probably from oral orgasms especially in view of poor dentition. I will order a chest x-ray for evaluation and exclude pneumonia. She needs to come back next week for blood check and I plan to see her back in approximately 2 weeks' time.  Anemia of other chronic disease This is likely anemia of chronic disease. The patient denies recent history of bleeding such as epistaxis, hematuria or hematochezia. She is asymptomatic from the anemia. We will observe for now.  She does not require transfusion now.    Tobacco abuse I spent some time counseling the patient the importance of tobacco cessation. she is currently attempting to quit on her own  Swelling of gums The causes unknown, could be related to infection. I will prescribe a course of antibiotics for now and I recommend improved dental hygiene.    All questions were answered. The patient knows to call the clinic with any problems, questions or concerns. No barriers to learning was detected.  I spent 25 minutes counseling the patient face to face. The total time spent in the appointment was 30 minutes and more than 50% was on counseling.     [facility], [place], MD 11/13/2013 2:47 PM

## 2013-11-13 NOTE — Telephone Encounter (Signed)
Pt confirmed labs/ov per 08/24 POF, gave pt AVS....KJ °

## 2013-11-13 NOTE — Assessment & Plan Note (Signed)
I spent some time counseling the patient the importance of tobacco cessation. she is currently attempting to quit on her own 

## 2013-11-14 ENCOUNTER — Other Ambulatory Visit: Payer: Self-pay | Admitting: *Deleted

## 2013-11-14 DIAGNOSIS — E785 Hyperlipidemia, unspecified: Secondary | ICD-10-CM

## 2013-11-14 DIAGNOSIS — I5032 Chronic diastolic (congestive) heart failure: Secondary | ICD-10-CM

## 2013-11-14 MED ORDER — ATORVASTATIN CALCIUM 80 MG PO TABS: 80.0000 mg | ORAL_TABLET | Freq: Every day | ORAL | Status: DC

## 2013-11-20 ENCOUNTER — Telehealth: Payer: Self-pay | Admitting: *Deleted

## 2013-11-20 ENCOUNTER — Other Ambulatory Visit (HOSPITAL_BASED_OUTPATIENT_CLINIC_OR_DEPARTMENT_OTHER): Payer: No Typology Code available for payment source

## 2013-11-20 DIAGNOSIS — D709 Neutropenia, unspecified: Secondary | ICD-10-CM

## 2013-11-20 DIAGNOSIS — D638 Anemia in other chronic diseases classified elsewhere: Secondary | ICD-10-CM

## 2013-11-20 DIAGNOSIS — D72819 Decreased white blood cell count, unspecified: Secondary | ICD-10-CM

## 2013-11-20 LAB — CBC WITH DIFFERENTIAL/PLATELET
BASO%: 0 % (ref 0.0–2.0)
BASOS ABS: 0 10*3/uL (ref 0.0–0.1)
EOS%: 0 % (ref 0.0–7.0)
Eosinophils Absolute: 0 10*3/uL (ref 0.0–0.5)
HCT: 30 % — ABNORMAL LOW (ref 34.8–46.6)
HGB: 9.4 g/dL — ABNORMAL LOW (ref 11.6–15.9)
LYMPH%: 17.2 % (ref 14.0–49.7)
MCH: 23.9 pg — AB (ref 25.1–34.0)
MCHC: 31.3 g/dL — ABNORMAL LOW (ref 31.5–36.0)
MCV: 76.3 fL — AB (ref 79.5–101.0)
MONO#: 0.3 10*3/uL (ref 0.1–0.9)
MONO%: 3.7 % (ref 0.0–14.0)
NEUT#: 5.9 10*3/uL (ref 1.5–6.5)
NEUT%: 79.1 % — ABNORMAL HIGH (ref 38.4–76.8)
Platelets: 314 10*3/uL (ref 145–400)
RBC: 3.93 10*6/uL (ref 3.70–5.45)
RDW: 17.7 % — AB (ref 11.2–14.5)
WBC: 7.5 10*3/uL (ref 3.9–10.3)
lymph#: 1.3 10*3/uL (ref 0.9–3.3)

## 2013-11-20 NOTE — Telephone Encounter (Signed)
Message copied by Patton Salles on Mon Nov 20, 2013  4:03 PM ------      Message from: [facility], [place]      Created: Mon Nov 20, 2013  3:32 PM      Regarding: cbc       CBC result is good, please let her know and reduce prednisone to 20 mg daily (just 1 pill) until I see her      ----- Message -----         From: [facility]         Sent: 11/20/2013   2:22 PM           To: Heath Lark, MD                   ------

## 2013-11-20 NOTE — Telephone Encounter (Signed)
Pt notified of results below. Verbalized understanding.  

## 2013-11-28 ENCOUNTER — Other Ambulatory Visit: Payer: Self-pay | Admitting: Hematology and Oncology

## 2013-11-28 ENCOUNTER — Other Ambulatory Visit: Payer: No Typology Code available for payment source

## 2013-11-28 ENCOUNTER — Ambulatory Visit: Payer: No Typology Code available for payment source | Admitting: Hematology and Oncology

## 2013-11-28 ENCOUNTER — Other Ambulatory Visit: Payer: Self-pay

## 2013-11-30 ENCOUNTER — Telehealth: Payer: Self-pay | Admitting: Hematology and Oncology

## 2013-11-30 NOTE — Telephone Encounter (Signed)
lvm for pt regarding to OCT appt already sched

## 2013-12-19 ENCOUNTER — Telehealth: Payer: Self-pay | Admitting: Hematology and Oncology

## 2013-12-19 NOTE — Telephone Encounter (Signed)
lvm for pt regarding to appt changed per [facility] staff msg....mailed pt appt sched and letter

## 2014-01-02 ENCOUNTER — Other Ambulatory Visit (INDEPENDENT_AMBULATORY_CARE_PROVIDER_SITE_OTHER): Payer: No Typology Code available for payment source | Admitting: *Deleted

## 2014-01-02 DIAGNOSIS — E785 Hyperlipidemia, unspecified: Secondary | ICD-10-CM

## 2014-01-02 DIAGNOSIS — I5032 Chronic diastolic (congestive) heart failure: Secondary | ICD-10-CM

## 2014-01-02 LAB — LIPID PANEL
Cholesterol: 149 mg/dL (ref 0–200)
HDL: 47.8 mg/dL (ref >39.00)
LDL Cholesterol: 83 mg/dL (ref 0–99)
NonHDL: 101.2
TRIGLYCERIDES: 91 mg/dL (ref 0.0–149.0)
Total CHOL/HDL Ratio: 3
VLDL: 18.2 mg/dL (ref 0.0–40.0)

## 2014-01-02 LAB — HEPATIC FUNCTION PANEL
ALT: 25 U/L (ref 0–35)
AST: 22 U/L (ref 0–37)
Albumin: 2.9 g/dL — ABNORMAL LOW (ref 3.5–5.2)
Alkaline Phosphatase: 97 U/L (ref 39–117)
BILIRUBIN TOTAL: 0.4 mg/dL (ref 0.2–1.2)
Bilirubin, Direct: 0 mg/dL (ref 0.0–0.3)
TOTAL PROTEIN: 8.5 g/dL — AB (ref 6.0–8.3)

## 2014-01-05 ENCOUNTER — Other Ambulatory Visit: Payer: No Typology Code available for payment source

## 2014-01-05 ENCOUNTER — Other Ambulatory Visit: Payer: Self-pay

## 2014-01-05 ENCOUNTER — Ambulatory Visit: Payer: No Typology Code available for payment source | Admitting: Hematology and Oncology

## 2014-01-09 ENCOUNTER — Telehealth: Payer: Self-pay | Admitting: Hematology and Oncology

## 2014-01-09 ENCOUNTER — Ambulatory Visit (HOSPITAL_BASED_OUTPATIENT_CLINIC_OR_DEPARTMENT_OTHER): Payer: No Typology Code available for payment source | Admitting: Hematology and Oncology

## 2014-01-09 ENCOUNTER — Other Ambulatory Visit (HOSPITAL_BASED_OUTPATIENT_CLINIC_OR_DEPARTMENT_OTHER): Payer: No Typology Code available for payment source

## 2014-01-09 ENCOUNTER — Encounter: Payer: Self-pay | Admitting: Hematology and Oncology

## 2014-01-09 VITALS — BP 128/88 | HR 89 | Temp 98.8°F | Resp 20 | Ht 66.0 in | Wt 212.9 lb

## 2014-01-09 DIAGNOSIS — Z87898 Personal history of other specified conditions: Secondary | ICD-10-CM

## 2014-01-09 DIAGNOSIS — Z23 Encounter for immunization: Secondary | ICD-10-CM

## 2014-01-09 DIAGNOSIS — D539 Nutritional anemia, unspecified: Secondary | ICD-10-CM

## 2014-01-09 DIAGNOSIS — R059 Cough, unspecified: Secondary | ICD-10-CM

## 2014-01-09 DIAGNOSIS — D638 Anemia in other chronic diseases classified elsewhere: Secondary | ICD-10-CM

## 2014-01-09 DIAGNOSIS — D709 Neutropenia, unspecified: Secondary | ICD-10-CM

## 2014-01-09 DIAGNOSIS — K029 Dental caries, unspecified: Secondary | ICD-10-CM

## 2014-01-09 DIAGNOSIS — R05 Cough: Secondary | ICD-10-CM

## 2014-01-09 DIAGNOSIS — D72819 Decreased white blood cell count, unspecified: Secondary | ICD-10-CM

## 2014-01-09 LAB — CBC & DIFF AND RETIC
BASO%: 0.3 % (ref 0.0–2.0)
Basophils Absolute: 0 10*3/uL (ref 0.0–0.1)
EOS%: 0.3 % (ref 0.0–7.0)
Eosinophils Absolute: 0 10*3/uL (ref 0.0–0.5)
HCT: 32 % — ABNORMAL LOW (ref 34.8–46.6)
HEMOGLOBIN: 10.1 g/dL — AB (ref 11.6–15.9)
Immature Retic Fract: 6.1 % (ref 1.60–10.00)
LYMPH%: 61.9 % — ABNORMAL HIGH (ref 14.0–49.7)
MCH: 24.6 pg — ABNORMAL LOW (ref 25.1–34.0)
MCHC: 31.5 g/dL (ref 31.5–36.0)
MCV: 77.9 fL — AB (ref 79.5–101.0)
MONO#: 1.1 10*3/uL — ABNORMAL HIGH (ref 0.1–0.9)
MONO%: 35.9 % — ABNORMAL HIGH (ref 0.0–14.0)
NEUT%: 1.6 % — ABNORMAL LOW (ref 38.4–76.8)
NEUTROS ABS: 0 10*3/uL — AB (ref 1.5–6.5)
NRBC: 0 % (ref 0–0)
PLATELETS: 219 10*3/uL (ref 145–400)
RBC: 4.11 10*6/uL (ref 3.70–5.45)
RDW: 18.2 % — ABNORMAL HIGH (ref 11.2–14.5)
RETIC %: 2.29 % — AB (ref 0.70–2.10)
RETIC CT ABS: 94.12 10*3/uL — AB (ref 33.70–90.70)
WBC: 3 10*3/uL — ABNORMAL LOW (ref 3.9–10.3)
lymph#: 1.9 10*3/uL (ref 0.9–3.3)

## 2014-01-09 LAB — COMPREHENSIVE METABOLIC PANEL (CC13)
ALT: 32 U/L (ref 0–55)
AST: 21 U/L (ref 5–34)
Albumin: 3 g/dL — ABNORMAL LOW (ref 3.5–5.0)
Alkaline Phosphatase: 96 U/L (ref 40–150)
Anion Gap: 9 mEq/L (ref 3–11)
BILIRUBIN TOTAL: 0.25 mg/dL (ref 0.20–1.20)
BUN: 22.9 mg/dL (ref 7.0–26.0)
CO2: 24 mEq/L (ref 22–29)
CREATININE: 0.9 mg/dL (ref 0.6–1.1)
Calcium: 9.2 mg/dL (ref 8.4–10.4)
Chloride: 104 mEq/L (ref 98–109)
Glucose: 86 mg/dl (ref 70–140)
Potassium: 4 mEq/L (ref 3.5–5.1)
Sodium: 137 mEq/L (ref 136–145)
Total Protein: 7.5 g/dL (ref 6.4–8.3)

## 2014-01-09 LAB — CHCC SMEAR

## 2014-01-09 LAB — SEDIMENTATION RATE: SED RATE: 47 mm/h — AB (ref 0–22)

## 2014-01-09 MED ORDER — PREDNISONE 5 MG PO TABS: 5.0000 mg | ORAL_TABLET | Freq: Every day | ORAL | Status: DC

## 2014-01-09 MED ORDER — INFLUENZA VAC SPLIT QUAD 0.5 ML IM SUSY
0.5000 mL | PREFILLED_SYRINGE | Freq: Once | INTRAMUSCULAR | Status: DC
  Filled 2014-01-09: qty 0.5

## 2014-01-09 NOTE — Progress Notes (Signed)
[facility] OFFICE PROGRESS NOTE  Morgan Chessman, MD SUMMARY OF HEMATOLOGIC HISTORY: This is a pleasant lady who is being referred here because of background of severe pancytopenia, lymphadenopathy and abnormal lymph node and bone marrow biopsy. Further evaluation suspect the patient may have syphilis. She was treated with doxycycline with worsening pancytopenia. Her antibiotic treatment was discontinued. From 06/02/2013 to 06/09/2013, the patient was admitted to the hospital for further workup for neutropenic fever. She was placed on G-CSF with no response to therapy. Repeat bone marrow biopsy was done. The patient was given broad-spectrum intravenous antibiotics and subsequently discharged for further followup as an outpatient.  On 06/14/2013, I started her on prednisone 60 mg daily. She was readmitted to the hospital again for neutropenic fever. 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 On 08/04/2013, I advised further prednisone taper. She is given G-CSF for severe neutropenia with no response. She was subsequently observed. In August 2015, she was noted to a recurrence of severe neutropenia. She was sent back on prednisone with good response to treatment. INTERVAL HISTORY: Morgan Bean 48 y.o. female returns for further followup. She missed her last appointment and has not been seen for almost 2 months. She has gained almost 30 pounds due to increased appetite. She is currently on 10 mg prednisone daily. She denies new lymphadenopathy. She complained of tooth pain. She denies recent drug use.   I have reviewed the past medical history, past surgical history, social history and family history with the patient and they are unchanged from previous note.  ALLERGIES:  is allergic to ciprofloxacin.  MEDICATIONS:  Current Outpatient Prescriptions  Medication Sig Dispense Refill  . albuterol (PROVENTIL) (2.5  MG/3ML) 0.083% nebulizer solution Take 2.5 mg by nebulization every 4 (four) hours as needed for wheezing or shortness of breath.      [name] amLODipine (NORVASC) 10 MG tablet TAKE ONE TABLET DAILY  30 tablet  5  . amoxicillin-clavulanate (AUGMENTIN) 875-125 MG per tablet Take 1 tablet by mouth 2 (two) times daily.  14 tablet  0  . atorvastatin (LIPITOR) 80 MG tablet Take 1 tablet (80 mg total) by mouth daily.  30 tablet  3  . buPROPion (WELLBUTRIN) 100 MG tablet Take 100 mg by mouth 2 (two) times daily.      . citalopram (CELEXA) 40 MG tablet Take 1 tablet (40 mg total) by mouth daily.  30 tablet  1  . cloNIDine (CATAPRES) 0.3 MG tablet Take 1 tablet (0.3 mg total) by mouth 2 (two) times daily.  60 tablet  3  . clopidogrel (PLAVIX) 75 MG tablet Take 1 tablet (75 mg total) by mouth daily.  30 tablet  11  . ferrous sulfate 325 (65 FE) MG tablet Take 650 mg by mouth daily with breakfast.      . furosemide (LASIX) 40 MG tablet TAKE ONE-HALF TABLET TWICE DAILY  30 tablet  2  . hydrOXYzine (ATARAX/VISTARIL) 25 MG tablet Take 25 mg by mouth at bedtime as needed (sleep).      . loratadine (CLARITIN) 10 MG tablet Take 1 tablet (10 mg total) by mouth daily.  30 tablet  3  . Multiple Vitamin (MULTIVITAMIN WITH MINERALS) TABS Take 1 tablet by mouth daily.      [name] omeprazole (PRILOSEC) 40 MG capsule Take 40 mg by mouth daily.      . polyethylene glycol powder (GLYCOLAX/MIRALAX) powder Take 17 g by mouth daily as needed (constipation).       [name]  potassium chloride (K-DUR,KLOR-CON) 10 MEQ tablet Take 1 tablet (10 mEq total) by mouth 2 (two) times daily with a meal.  60 tablet  3  . predniSONE (DELTASONE) 20 MG tablet Take 2 tablets (40 mg total) by mouth daily with breakfast.  90 tablet  1  . traZODone (DESYREL) 100 MG tablet Take 1 tablet (100 mg total) by mouth at bedtime.  30 tablet  1  . predniSONE (DELTASONE) 5 MG tablet Take 1 tablet (5 mg total) by mouth daily with breakfast.  30 tablet  0   Current  Facility-Administered Medications  Medication Dose Route Frequency Provider Last Rate Last Dose  . Influenza vac split quadrivalent PF (FLUARIX) injection 0.5 mL  0.5 mL Intramuscular Once Artis Delay, MD         REVIEW OF SYSTEMS:   Constitutional: Denies fevers, chills or night sweats Eyes: Denies blurriness of vision Ears, nose, mouth, throat, and face: Denies mucositis or sore throat Respiratory: Denies cough, dyspnea or wheezes Cardiovascular: Denies palpitation, chest discomfort or lower extremity swelling Gastrointestinal:  Denies nausea, heartburn or change in bowel habits Skin: Denies abnormal skin rashes Lymphatics: Denies new lymphadenopathy or easy bruising Neurological:Denies numbness, tingling or new weaknesses Behavioral/Psych: Mood is stable, no new changes  All other systems were reviewed with the patient and are negative.  PHYSICAL EXAMINATION: ECOG PERFORMANCE STATUS: 0 - Asymptomatic  Filed Vitals:   01/09/14 1321  BP: 128/88  Pulse: 89  Temp: 98.8 F (37.1 C)  Resp: 20   Filed Weights   01/09/14 1321  Weight: 212 lb 14.4 oz (96.571 kg)    GENERAL:alert, no distress and comfortable SKIN: skin color, texture, turgor are normal, no rashes or significant lesions EYES: normal, Conjunctiva are pink and non-injected, sclera clear OROPHARYNX:no exudate, no erythema and lips, buccal mucosa, and tongue normal . Noted dental caries and poor oral hygiene NECK: supple, thyroid normal size, non-tender, without nodularity LYMPH:  no palpable lymphadenopathy in the cervical, axillary or inguinal LUNGS: clear to auscultation and percussion with normal breathing effort HEART: regular rate & rhythm and no murmurs and no lower extremity edema ABDOMEN:abdomen soft, non-tender and normal bowel sounds Musculoskeletal:no cyanosis of digits and no clubbing  NEURO: alert & oriented x 3 with fluent speech, no focal motor/sensory deficits  LABORATORY DATA:  I have reviewed the  data as listed Results for orders placed in visit on 01/09/14 (from the past 48 hour(s))  CBC & DIFF AND RETIC     Status: Abnormal   Collection Time    01/09/14  1:03 PM      Result Value Ref Range   WBC 3.0 (*) 3.9 - 10.3 10e3/uL   NEUT# 0.0 (*) 1.5 - 6.5 10e3/uL   HGB 10.1 (*) 11.6 - 15.9 g/dL   HCT 81.8 (*) 56.3 - 14.9 %   Platelets 219  145 - 400 10e3/uL   MCV 77.9 (*) 79.5 - 101.0 fL   MCH 24.6 (*) 25.1 - 34.0 pg   MCHC 31.5  31.5 - 36.0 g/dL   RBC 7.02  6.37 - 8.58 10e6/uL   RDW 18.2 (*) 11.2 - 14.5 %   lymph# 1.9  0.9 - 3.3 10e3/uL   MONO# 1.1 (*) 0.1 - 0.9 10e3/uL   Eosinophils Absolute 0.0  0.0 - 0.5 10e3/uL   Basophils Absolute 0.0  0.0 - 0.1 10e3/uL   NEUT% 1.6 (*) 38.4 - 76.8 %   LYMPH% 61.9 (*) 14.0 - 49.7 %   MONO% 35.9 (*)  0.0 - 14.0 %   EOS% 0.3  0.0 - 7.0 %   BASO% 0.3  0.0 - 2.0 %   nRBC 0  0 - 0 %   Retic % 2.29 (*) 0.70 - 2.10 %   Retic Ct Abs 94.12 (*) 33.70 - 90.70 10e3/uL   Immature Retic Fract 6.10  1.60 - 10.00 %  COMPREHENSIVE METABOLIC PANEL (GE36)     Status: Abnormal   Collection Time    01/09/14  1:03 PM      Result Value Ref Range   Sodium 137  136 - 145 mEq/L   Potassium 4.0  3.5 - 5.1 mEq/L   Chloride 104  98 - 109 mEq/L   CO2 24  22 - 29 mEq/L   Glucose 86  70 - 140 mg/dl   BUN 22.9  7.0 - 26.0 mg/dL   Creatinine 0.9  0.6 - 1.1 mg/dL   Total Bilirubin 0.25  0.20 - 1.20 mg/dL   Alkaline Phosphatase 96  40 - 150 U/L   AST 21  5 - 34 U/L   ALT 32  0 - 55 U/L   Total Protein 7.5  6.4 - 8.3 g/dL   Albumin 3.0 (*) 3.5 - 5.0 g/dL   Calcium 9.2  8.4 - 10.4 mg/dL   Anion Gap 9  3 - 11 mEq/L  [facility] SMEAR     Status: None   Collection Time    01/09/14  1:03 PM      Result Value Ref Range   Smear Result Smear Available      Lab Results  Component Value Date   WBC 3.0* 01/09/2014   HGB 10.1* 01/09/2014   HCT 32.0* 01/09/2014   MCV 77.9* 01/09/2014   PLT 219 01/09/2014   ASSESSMENT & PLAN:  Neutropenia This is thought to be related  to cocaine abuse. The patient had not use cocaine for a while, since November 2014. She is not symptomatic. She had history of recurrent neutropenic fevers. I would recommend G-CSF to prevent infection. Another possibility would be she may have autoimmune condition. She responded to chronic prednisone therapy until recently when we initiated steroid taper. The patient is becoming cushingoid and has missed appointment recently. I plan to start prednisone taper to 7.5 mg starting tomorrow. Starting 01/21/14, she will starting taking 5 mg daily. I reinforced the importance of showing up for blood work and future followup.  Anemia in chronic illness This is likely anemia of chronic disease. The patient denies recent history of bleeding such as epistaxis, hematuria or hematochezia. She is asymptomatic from the anemia. We will observe for now.  She does not require transfusion now.   Dental caries She has significant dental caries. This is likely going to be the source of infection. I recommend increase oral hygiene and dental appointments.    All questions were answered. The patient knows to call the clinic with any problems, questions or concerns. No barriers to learning was detected.  I spent 25 minutes counseling the patient face to face. The total time spent in the appointment was 30 minutes and more than 50% was on counseling.     [facility], Luis M. Cintron, MD 01/09/2014 9:07 PM

## 2014-01-09 NOTE — Assessment & Plan Note (Signed)
She has significant dental caries. This is likely going to be the source of infection. I recommend increase oral hygiene and dental appointments.

## 2014-01-09 NOTE — Assessment & Plan Note (Signed)
This is likely anemia of chronic disease. The patient denies recent history of bleeding such as epistaxis, hematuria or hematochezia. She is asymptomatic from the anemia. We will observe for now.  She does not require transfusion now.   

## 2014-01-09 NOTE — Assessment & Plan Note (Signed)
This is thought to be related to cocaine abuse. The patient had not use cocaine for a while, since November 2014. She is not symptomatic. She had history of recurrent neutropenic fevers. I would recommend G-CSF to prevent infection. Another possibility would be she may have autoimmune condition. She responded to chronic prednisone therapy until recently when we initiated steroid taper. The patient is becoming cushingoid and has missed appointment recently. I plan to start prednisone taper to 7.5 mg starting tomorrow. Starting 01/21/14, she will starting taking 5 mg daily. I reinforced the importance of showing up for blood work and future followup.

## 2014-01-09 NOTE — Telephone Encounter (Signed)
Pt confirmed labs/ov/inj per 10/20 POF, gave pt AVS.... KJ

## 2014-01-10 ENCOUNTER — Ambulatory Visit (HOSPITAL_BASED_OUTPATIENT_CLINIC_OR_DEPARTMENT_OTHER): Payer: No Typology Code available for payment source

## 2014-01-10 VITALS — BP 116/81 | HR 79 | Temp 97.9°F

## 2014-01-10 DIAGNOSIS — R5081 Fever presenting with conditions classified elsewhere: Secondary | ICD-10-CM

## 2014-01-10 DIAGNOSIS — D709 Neutropenia, unspecified: Secondary | ICD-10-CM

## 2014-01-10 MED ORDER — TBO-FILGRASTIM 480 MCG/0.8ML ~~LOC~~ SOSY
480.0000 ug | PREFILLED_SYRINGE | Freq: Once | SUBCUTANEOUS | Status: AC
  Administered 2014-01-10: 480 ug via SUBCUTANEOUS
  Filled 2014-01-10: qty 0.8

## 2014-01-10 NOTE — Patient Instructions (Signed)

## 2014-01-11 ENCOUNTER — Ambulatory Visit (HOSPITAL_BASED_OUTPATIENT_CLINIC_OR_DEPARTMENT_OTHER): Payer: No Typology Code available for payment source

## 2014-01-11 VITALS — BP 111/64 | HR 87 | Temp 97.5°F

## 2014-01-11 DIAGNOSIS — D709 Neutropenia, unspecified: Secondary | ICD-10-CM

## 2014-01-11 DIAGNOSIS — R5081 Fever presenting with conditions classified elsewhere: Secondary | ICD-10-CM

## 2014-01-11 MED ORDER — TBO-FILGRASTIM 480 MCG/0.8ML ~~LOC~~ SOSY
480.0000 ug | PREFILLED_SYRINGE | Freq: Once | SUBCUTANEOUS | Status: AC
  Administered 2014-01-11: 480 ug via SUBCUTANEOUS
  Filled 2014-01-11: qty 0.8

## 2014-01-11 NOTE — Patient Instructions (Signed)

## 2014-01-11 NOTE — Progress Notes (Signed)
Pt complains of feeling weak, dizzy, and tired.  Orthostatic vitals taken laying: BP 99/56 heart rate 83, sitting: 93/62 heart rate 84, standing: 111/64 heart rate 84.  Tammi, RN notified of pt concerns. Dr Alvy Bimler made aware through in basket message.

## 2014-01-22 ENCOUNTER — Encounter: Payer: Self-pay | Admitting: Hematology and Oncology

## 2014-01-30 ENCOUNTER — Encounter: Payer: Self-pay | Admitting: Hematology and Oncology

## 2014-01-30 ENCOUNTER — Ambulatory Visit (HOSPITAL_BASED_OUTPATIENT_CLINIC_OR_DEPARTMENT_OTHER): Payer: No Typology Code available for payment source | Admitting: Hematology and Oncology

## 2014-01-30 ENCOUNTER — Other Ambulatory Visit (HOSPITAL_BASED_OUTPATIENT_CLINIC_OR_DEPARTMENT_OTHER): Payer: No Typology Code available for payment source

## 2014-01-30 ENCOUNTER — Telehealth: Payer: Self-pay | Admitting: Hematology and Oncology

## 2014-01-30 VITALS — BP 104/77 | HR 80 | Temp 98.5°F | Resp 18 | Ht 66.0 in | Wt 207.6 lb

## 2014-01-30 DIAGNOSIS — D709 Neutropenia, unspecified: Secondary | ICD-10-CM

## 2014-01-30 DIAGNOSIS — R591 Generalized enlarged lymph nodes: Secondary | ICD-10-CM

## 2014-01-30 DIAGNOSIS — D638 Anemia in other chronic diseases classified elsewhere: Secondary | ICD-10-CM

## 2014-01-30 LAB — CBC WITH DIFFERENTIAL/PLATELET
BASO%: 0.4 % (ref 0.0–2.0)
Basophils Absolute: 0 10*3/uL (ref 0.0–0.1)
EOS ABS: 0 10*3/uL (ref 0.0–0.5)
EOS%: 1.2 % (ref 0.0–7.0)
HEMATOCRIT: 31.2 % — AB (ref 34.8–46.6)
HGB: 9.8 g/dL — ABNORMAL LOW (ref 11.6–15.9)
LYMPH#: 1.6 10*3/uL (ref 0.9–3.3)
LYMPH%: 62 % — AB (ref 14.0–49.7)
MCH: 24.4 pg — ABNORMAL LOW (ref 25.1–34.0)
MCHC: 31.4 g/dL — ABNORMAL LOW (ref 31.5–36.0)
MCV: 77.8 fL — ABNORMAL LOW (ref 79.5–101.0)
MONO#: 0.9 10*3/uL (ref 0.1–0.9)
MONO%: 34.4 % — ABNORMAL HIGH (ref 0.0–14.0)
NEUT%: 2 % — ABNORMAL LOW (ref 38.4–76.8)
NEUTROS ABS: 0.1 10*3/uL — AB (ref 1.5–6.5)
PLATELETS: 206 10*3/uL (ref 145–400)
RBC: 4.01 10*6/uL (ref 3.70–5.45)
RDW: 16.9 % — ABNORMAL HIGH (ref 11.2–14.5)
WBC: 2.5 10*3/uL — AB (ref 3.9–10.3)

## 2014-01-30 LAB — TECHNOLOGIST REVIEW

## 2014-01-30 NOTE — Telephone Encounter (Signed)
gv adn printed appt sched anda vs for pt for NOV...gv pt baiurm

## 2014-01-30 NOTE — Assessment & Plan Note (Addendum)
She has persistent, severe neutropenia, unresponsive to prednisone and recent G-CSF. She had multiple bone marrow biopsy performed in the past and overall consensus from multiple subspecialties were this is related to cocaine abuse. The patient denies cocaine abuse for over 6 months. Previously, she had nonspecific lymphadenopathy and lymph node biopsy was non-diagnostic. I recommend repeat CT scan of the neck, chest abdomen and pelvis to exclude new lymphadenopathy or possible diagnosis of lymphoma. She agreed to proceed. In the meantime, she will remain on 5 mg prednisone. I will review her case at the next hematology tumor board again. I recommend neutropenic precaution with wearing masks when she is in public domains and increased hand hygeine

## 2014-01-30 NOTE — Assessment & Plan Note (Signed)
This is likely anemia of chronic disease. The patient denies recent history of bleeding such as epistaxis, hematuria or hematochezia. She is asymptomatic from the anemia. We will observe for now.  She does not require transfusion now.   

## 2014-01-30 NOTE — Progress Notes (Signed)
[place] OFFICE PROGRESS NOTE  [name], MD SUMMARY OF HEMATOLOGIC HISTORY:  This is a pleasant lady who is being referred here because of background of severe pancytopenia, lymphadenopathy and abnormal lymph node and bone marrow biopsy. Further evaluation suspect the patient may have syphilis. She was treated with doxycycline with worsening pancytopenia. Her antibiotic treatment was discontinued. From 06/02/2013 to 06/09/2013, the patient was admitted to the hospital for further workup for neutropenic fever. She was placed on G-CSF with no response to therapy. Repeat bone marrow biopsy was done. The patient was given broad-spectrum intravenous antibiotics and subsequently discharged for further followup as an outpatient.  On 06/14/2013, I started her on prednisone 60 mg daily. She was readmitted to the hospital again for neutropenic fever. 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 On 08/04/2013, I advised further prednisone taper. She is given G-CSF for severe neutropenia with no response. She was subsequently observed. In August 2015, she was noted to a recurrence of severe neutropenia. She was sent back on prednisone with initial good response to treatment but then relapsed soon after initiation of prednisone taper INTERVAL HISTORY: Morgan Bean 48 y.o. female returns for further followup. She has lost some weight since we initiated prednisone taper. She denies recent infection. Denies Lymphadenopathy. I have reviewed the past medical history, past surgical history, social history and family history with the patient and they are unchanged from previous note.  ALLERGIES:  is allergic to ciprofloxacin.  MEDICATIONS:  Current Outpatient Prescriptions  Medication Sig Dispense Refill  . albuterol (PROVENTIL) (2.5 MG/3ML) 0.083% nebulizer solution Take 2.5 mg by nebulization every 4 (four) hours as needed for  wheezing or shortness of breath.    [name] amLODipine (NORVASC) 10 MG tablet TAKE ONE TABLET DAILY 30 tablet 5  . atorvastatin (LIPITOR) 80 MG tablet Take 1 tablet (80 mg total) by mouth daily. 30 tablet 3  . buPROPion (WELLBUTRIN) 100 MG tablet Take 100 mg by mouth 2 (two) times daily.    . citalopram (CELEXA) 40 MG tablet Take 1 tablet (40 mg total) by mouth daily. 30 tablet 1  . cloNIDine (CATAPRES) 0.3 MG tablet Take 1 tablet (0.3 mg total) by mouth 2 (two) times daily. 60 tablet 3  . clopidogrel (PLAVIX) 75 MG tablet Take 1 tablet (75 mg total) by mouth daily. 30 tablet 11  . ferrous sulfate 325 (65 FE) MG tablet Take 650 mg by mouth daily with breakfast.    . furosemide (LASIX) 40 MG tablet TAKE ONE-HALF TABLET TWICE DAILY 30 tablet 2  . hydrOXYzine (ATARAX/VISTARIL) 25 MG tablet Take 25 mg by mouth at bedtime as needed (sleep).    . loratadine (CLARITIN) 10 MG tablet Take 1 tablet (10 mg total) by mouth daily. 30 tablet 3  . Multiple Vitamin (MULTIVITAMIN WITH MINERALS) TABS Take 1 tablet by mouth daily.    [name] omeprazole (PRILOSEC) 40 MG capsule Take 40 mg by mouth daily.    . polyethylene glycol powder (GLYCOLAX/MIRALAX) powder Take 17 g by mouth daily as needed (constipation).     . potassium chloride (K-DUR,KLOR-CON) 10 MEQ tablet Take 1 tablet (10 mEq total) by mouth 2 (two) times daily with a meal. 60 tablet 3  . predniSONE (DELTASONE) 20 MG tablet Take 2 tablets (40 mg total) by mouth daily with breakfast. 90 tablet 1  . predniSONE (DELTASONE) 5 MG tablet Take 1 tablet (5 mg total) by mouth daily with breakfast. 30  tablet 0  . traZODone (DESYREL) 100 MG tablet Take 1 tablet (100 mg total) by mouth at bedtime. 30 tablet 1   No current facility-administered medications for this visit.     REVIEW OF SYSTEMS:   Constitutional: Denies fevers, chills or night sweats Eyes: Denies blurriness of vision Ears, nose, mouth, throat, and face: Denies mucositis or sore throat Respiratory: Denies  cough, dyspnea or wheezes Cardiovascular: Denies palpitation, chest discomfort or lower extremity swelling Gastrointestinal:  Denies nausea, heartburn or change in bowel habits Skin: Denies abnormal skin rashes Lymphatics: Denies new lymphadenopathy or easy bruising Neurological:Denies numbness, tingling or new weaknesses Behavioral/Psych: Mood is stable, no new changes  All other systems were reviewed with the patient and are negative.  PHYSICAL EXAMINATION: ECOG PERFORMANCE STATUS: 0 - Asymptomatic  Filed Vitals:   01/30/14 0841  BP: 104/77  Pulse: 80  Temp: 98.5 F (36.9 C)  Resp: 18   Filed Weights   01/30/14 0841  Weight: 207 lb 9.6 oz (94.167 kg)    GENERAL:alert, no distress and comfortable SKIN: skin color, texture, turgor are normal, no rashes or significant lesions EYES: normal, Conjunctiva are pink and non-injected, sclera clear OROPHARYNX:no exudate, no erythema and lips, buccal mucosa, and tongue normal . Poor dentition is noted NECK: supple, thyroid normal size, non-tender, without nodularity LYMPH:  no palpable lymphadenopathy in the cervical, axillary or inguinal LUNGS: clear to auscultation and percussion with normal breathing effort HEART: regular rate & rhythm and no murmurs and no lower extremity edema ABDOMEN:abdomen soft, non-tender and normal bowel sounds Musculoskeletal:no cyanosis of digits and no clubbing  NEURO: alert & oriented x 3 with fluent speech, no focal motor/sensory deficits  LABORATORY DATA:  I have reviewed the data as listed Results for orders placed or performed in visit on 01/30/14 (from the past 48 hour(s))  CBC with Differential     Status: Abnormal   Collection Time: 01/30/14  8:32 AM  Result Value Ref Range   WBC 2.5 (L) 3.9 - 10.3 10e3/uL   NEUT# 0.1 (LL) 1.5 - 6.5 10e3/uL   HGB 9.8 (L) 11.6 - 15.9 g/dL   HCT 31.2 (L) 34.8 - 46.6 %   Platelets 206 145 - 400 10e3/uL   MCV 77.8 (L) 79.5 - 101.0 fL   MCH 24.4 (L) 25.1 - 34.0  pg   MCHC 31.4 (L) 31.5 - 36.0 g/dL   RBC 4.01 3.70 - 5.45 10e6/uL   RDW 16.9 (H) 11.2 - 14.5 %   lymph# 1.6 0.9 - 3.3 10e3/uL   MONO# 0.9 0.1 - 0.9 10e3/uL   Eosinophils Absolute 0.0 0.0 - 0.5 10e3/uL   Basophils Absolute 0.0 0.0 - 0.1 10e3/uL   NEUT% 2.0 (L) 38.4 - 76.8 %   LYMPH% 62.0 (H) 14.0 - 49.7 %   MONO% 34.4 (H) 0.0 - 14.0 %   EOS% 1.2 0.0 - 7.0 %   BASO% 0.4 0.0 - 2.0 %  TECHNOLOGIST REVIEW     Status: None   Collection Time: 01/30/14  8:32 AM  Result Value Ref Range   Technologist Review Few lymphs with large granules.     Lab Results  Component Value Date   WBC 2.5* 01/30/2014   HGB 9.8* 01/30/2014   HCT 31.2* 01/30/2014   MCV 77.8* 01/30/2014   PLT 206 01/30/2014    ASSESSMENT & PLAN:  Neutropenia She has persistent, severe neutropenia, unresponsive to prednisone and recent G-CSF. She had multiple bone marrow biopsy performed in the past and overall  consensus from multiple subspecialties were this is related to cocaine abuse. The patient denies cocaine abuse for over 6 months. Previously, she had nonspecific lymphadenopathy and lymph node biopsy was non-diagnostic. I recommend repeat CT scan of the neck, chest abdomen and pelvis to exclude new lymphadenopathy or possible diagnosis of lymphoma. She agreed to proceed. In the meantime, she will remain on 5 mg prednisone. I will review her case at the next hematology tumor board again. I recommend neutropenic precaution with wearing masks when she is in public domains and increased hand hygeine  Anemia in chronic illness This is likely anemia of chronic disease. The patient denies recent history of bleeding such as epistaxis, hematuria or hematochezia. She is asymptomatic from the anemia. We will observe for now.  She does not require transfusion now.      All questions were answered. The patient knows to call the clinic with any problems, questions or concerns. No barriers to learning was detected.  I spent 30  minutes counseling the patient face to face. The total time spent in the appointment was 40 minutes and more than 50% was on counseling.     [place], [place], MD 01/30/2014 9:56 AM

## 2014-02-01 ENCOUNTER — Other Ambulatory Visit: Payer: Self-pay | Admitting: Cardiology

## 2014-02-02 ENCOUNTER — Encounter (HOSPITAL_COMMUNITY): Payer: Self-pay

## 2014-02-02 ENCOUNTER — Ambulatory Visit (HOSPITAL_COMMUNITY)
Admission: RE | Admit: 2014-02-02 | Discharge: 2014-02-02 | Disposition: A | Discharge status: Home / Self Care | Payer: No Typology Code available for payment source | Source: Ambulatory Visit | Attending: Hematology and Oncology | Admitting: Hematology and Oncology

## 2014-02-02 DIAGNOSIS — D709 Neutropenia, unspecified: Secondary | ICD-10-CM

## 2014-02-02 DIAGNOSIS — D259 Leiomyoma of uterus, unspecified: Secondary | ICD-10-CM | POA: Insufficient documentation

## 2014-02-02 DIAGNOSIS — C8590 Non-Hodgkin lymphoma, unspecified, unspecified site: Secondary | ICD-10-CM | POA: Insufficient documentation

## 2014-02-02 DIAGNOSIS — I6529 Occlusion and stenosis of unspecified carotid artery: Secondary | ICD-10-CM | POA: Insufficient documentation

## 2014-02-02 DIAGNOSIS — J9811 Atelectasis: Secondary | ICD-10-CM | POA: Insufficient documentation

## 2014-02-02 DIAGNOSIS — N2889 Other specified disorders of kidney and ureter: Secondary | ICD-10-CM | POA: Insufficient documentation

## 2014-02-02 DIAGNOSIS — R160 Hepatomegaly, not elsewhere classified: Secondary | ICD-10-CM | POA: Insufficient documentation

## 2014-02-02 DIAGNOSIS — R591 Generalized enlarged lymph nodes: Secondary | ICD-10-CM

## 2014-02-02 DIAGNOSIS — I2510 Atherosclerotic heart disease of native coronary artery without angina pectoris: Secondary | ICD-10-CM | POA: Insufficient documentation

## 2014-02-02 MED ORDER — IOHEXOL 300 MG/ML  SOLN
100.0000 mL | Freq: Once | INTRAMUSCULAR | Status: AC | PRN
  Administered 2014-02-02: 100 mL via INTRAVENOUS

## 2014-02-05 ENCOUNTER — Telehealth: Payer: Self-pay | Admitting: Hematology and Oncology

## 2014-02-05 ENCOUNTER — Ambulatory Visit (HOSPITAL_BASED_OUTPATIENT_CLINIC_OR_DEPARTMENT_OTHER): Payer: No Typology Code available for payment source | Admitting: Hematology and Oncology

## 2014-02-05 VITALS — BP 107/72 | HR 89 | Temp 98.0°F | Resp 18 | Ht 66.0 in | Wt 205.2 lb

## 2014-02-05 DIAGNOSIS — R591 Generalized enlarged lymph nodes: Secondary | ICD-10-CM

## 2014-02-05 DIAGNOSIS — D709 Neutropenia, unspecified: Secondary | ICD-10-CM

## 2014-02-05 NOTE — Assessment & Plan Note (Signed)
She has persistent, severe neutropenia, unresponsive to prednisone and recent G-CSF. She had multiple bone marrow biopsy performed in the past and overall consensus from multiple subspecialties were this is related to cocaine abuse. The patient denies cocaine abuse for over 6 months. Previously, she had nonspecific lymphadenopathy and lymph node biopsy was non-diagnostic. Repeat CT scan show nonspecific lymphadenopathy that overall is improving. In the meantime, she will remain on 5 mg prednisone. I will review her case at the next hematology tumor board again. I recommend neutropenic precaution with wearing masks when she is in public domains and increased hand hygeine

## 2014-02-05 NOTE — Progress Notes (Signed)
[facility] OFFICE PROGRESS NOTE  Morgan Chessman, MD SUMMARY OF HEMATOLOGIC HISTORY:  This is a pleasant lady who is being referred here because of background of severe pancytopenia, lymphadenopathy and abnormal lymph node and bone marrow biopsy. Further evaluation suspect the patient may have syphilis. She was treated with doxycycline with worsening pancytopenia. Her antibiotic treatment was discontinued. From 06/02/2013 to 06/09/2013, the patient was admitted to the hospital for further workup for neutropenic fever. She was placed on G-CSF with no response to therapy. Repeat bone marrow biopsy was done. The patient was given broad-spectrum intravenous antibiotics and subsequently discharged for further followup as an outpatient.  On 06/14/2013, I started her on prednisone 60 mg daily. She was readmitted to the hospital again for neutropenic fever. 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 On 08/04/2013, I advised further prednisone taper. She is given G-CSF for severe neutropenia with no response. She was subsequently observed. In August 2015, she was noted to a recurrence of severe neutropenia. She was sent back on prednisone with initial good response to treatment but then relapsed soon after initiation of prednisone taper INTERVAL HISTORY: Morgan Bean 48 y.o. female returns for further follow-up. She denies new lymphadenopathy. Her weight is coming down. She denies recent drug use. Denies recent infection.  I have reviewed the past medical history, past surgical history, social history and family history with the patient and they are unchanged from previous note.  ALLERGIES:  is allergic to ciprofloxacin.  MEDICATIONS:  Current Outpatient Prescriptions  Medication Sig Dispense Refill  . albuterol (PROVENTIL) (2.5 MG/3ML) 0.083% nebulizer solution Take 2.5 mg by nebulization every 4 (four) hours as needed for  wheezing or shortness of breath.    [name] amLODipine (NORVASC) 10 MG tablet TAKE ONE TABLET DAILY 30 tablet 5  . atorvastatin (LIPITOR) 80 MG tablet Take 1 tablet (80 mg total) by mouth daily. 30 tablet 3  . buPROPion (WELLBUTRIN) 100 MG tablet Take 100 mg by mouth 2 (two) times daily.    . citalopram (CELEXA) 40 MG tablet Take 1 tablet (40 mg total) by mouth daily. 30 tablet 1  . cloNIDine (CATAPRES) 0.3 MG tablet Take 1 tablet (0.3 mg total) by mouth 2 (two) times daily. 60 tablet 3  . clopidogrel (PLAVIX) 75 MG tablet Take 1 tablet (75 mg total) by mouth daily. 30 tablet 11  . ferrous sulfate 325 (65 FE) MG tablet Take 650 mg by mouth daily with breakfast.    . furosemide (LASIX) 40 MG tablet TAKE 1/2 TABLET BY MOUTH TWICE DAILY 30 tablet 1  . hydrOXYzine (ATARAX/VISTARIL) 25 MG tablet Take 25 mg by mouth at bedtime as needed (sleep).    . loratadine (CLARITIN) 10 MG tablet Take 1 tablet (10 mg total) by mouth daily. 30 tablet 3  . Multiple Vitamin (MULTIVITAMIN WITH MINERALS) TABS Take 1 tablet by mouth daily.    [name] omeprazole (PRILOSEC) 40 MG capsule Take 40 mg by mouth daily.    . polyethylene glycol powder (GLYCOLAX/MIRALAX) powder Take 17 g by mouth daily as needed (constipation).     . potassium chloride (K-DUR,KLOR-CON) 10 MEQ tablet Take 1 tablet (10 mEq total) by mouth 2 (two) times daily with a meal. 60 tablet 3  . predniSONE (DELTASONE) 5 MG tablet Take 1 tablet (5 mg total) by mouth daily with breakfast. 30 tablet 0  . traZODone (DESYREL) 100 MG tablet Take 1 tablet (100 mg total) by mouth  at bedtime. 30 tablet 1   No current facility-administered medications for this visit.     REVIEW OF SYSTEMS:   Constitutional: Denies fevers, chills or night sweats Eyes: Denies blurriness of vision Ears, nose, mouth, throat, and face: Denies mucositis or sore throat Respiratory: Denies cough, dyspnea or wheezes Cardiovascular: Denies palpitation, chest discomfort or lower extremity  swelling Gastrointestinal:  Denies nausea, heartburn or change in bowel habits Skin: Denies abnormal skin rashes Lymphatics: Denies new lymphadenopathy or easy bruising Neurological:Denies numbness, tingling or new weaknesses Behavioral/Psych: Mood is stable, no new changes  All other systems were reviewed with the patient and are negative.  PHYSICAL EXAMINATION: ECOG PERFORMANCE STATUS: 0 - Asymptomatic  Filed Vitals:   02/05/14 1138  BP: 107/72  Pulse: 89  Temp: 98 F (36.7 C)  Resp: 18   Filed Weights   02/05/14 1138  Weight: 205 lb 3.2 oz (93.078 kg)    GENERAL:alert, no distress and comfortable SKIN: skin color, texture, turgor are normal, no rashes or significant lesions EYES: normal, Conjunctiva are pink and non-injected, sclera clear Musculoskeletal:no cyanosis of digits and no clubbing  NEURO: alert & oriented x 3 with fluent speech, no focal motor/sensory deficits  LABORATORY DATA:  I have reviewed the data as listed No results found for this or any previous visit (from the past 48 hour(s)).  Lab Results  Component Value Date   WBC 2.5* 01/30/2014   HGB 9.8* 01/30/2014   HCT 31.2* 01/30/2014   MCV 77.8* 01/30/2014   PLT 206 01/30/2014   I reviewed the CT scan of the neck chest abdomen and pelvis with her and her brother. ASSESSMENT & PLAN:  Neutropenia She has persistent, severe neutropenia, unresponsive to prednisone and recent G-CSF. She had multiple bone marrow biopsy performed in the past and overall consensus from multiple subspecialties were this is related to cocaine abuse. The patient denies cocaine abuse for over 6 months. Previously, she had nonspecific lymphadenopathy and lymph node biopsy was non-diagnostic. Repeat CT scan show nonspecific lymphadenopathy that overall is improving. In the meantime, she will remain on 5 mg prednisone. I will review her case at the next hematology tumor board again. I recommend neutropenic precaution with wearing  masks when she is in public domains and increased hand hygeine     All questions were answered. The patient knows to call the clinic with any problems, questions or concerns. No barriers to learning was detected.  I spent 25 minutes counseling the patient face to face. The total time spent in the appointment was 30 minutes and more than 50% was on counseling.     [place], [place], MD 02/05/2014 12:25 PM

## 2014-02-05 NOTE — Telephone Encounter (Signed)
Gave avs & cal for Dec. °

## 2014-02-06 ENCOUNTER — Other Ambulatory Visit: Payer: Self-pay | Admitting: Hematology and Oncology

## 2014-02-06 ENCOUNTER — Telehealth: Payer: Self-pay | Admitting: Hematology and Oncology

## 2014-02-06 DIAGNOSIS — D709 Neutropenia, unspecified: Secondary | ICD-10-CM

## 2014-02-06 NOTE — Telephone Encounter (Signed)
I spoke with the patient and review hematology tumor board recommendations. If the patient continued to have lack of response to prednisone, I will plan on trying IVIG treatment or consider rituximab in the future. 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

## 2014-02-07 ENCOUNTER — Telehealth: Payer: Self-pay | Admitting: *Deleted

## 2014-02-07 NOTE — Telephone Encounter (Signed)
Per staff message and POF I have scheduled appts. Advised scheduler of appts. JMW  

## 2014-02-19 ENCOUNTER — Encounter: Payer: Self-pay | Admitting: Hematology and Oncology

## 2014-02-19 NOTE — Progress Notes (Signed)
Called patient about IVIG drug replacement, lmovm asking for her to please return my call.

## 2014-02-20 ENCOUNTER — Encounter: Payer: Self-pay | Admitting: Hematology and Oncology

## 2014-02-20 ENCOUNTER — Ambulatory Visit (HOSPITAL_BASED_OUTPATIENT_CLINIC_OR_DEPARTMENT_OTHER): Payer: Self-pay | Admitting: Hematology and Oncology

## 2014-02-20 ENCOUNTER — Telehealth: Payer: Self-pay | Admitting: Hematology and Oncology

## 2014-02-20 ENCOUNTER — Ambulatory Visit (HOSPITAL_BASED_OUTPATIENT_CLINIC_OR_DEPARTMENT_OTHER): Payer: Self-pay

## 2014-02-20 ENCOUNTER — Other Ambulatory Visit (HOSPITAL_BASED_OUTPATIENT_CLINIC_OR_DEPARTMENT_OTHER): Payer: Self-pay

## 2014-02-20 VITALS — BP 122/92 | HR 100 | Temp 98.1°F | Resp 18 | Ht 66.0 in | Wt 207.2 lb

## 2014-02-20 DIAGNOSIS — D638 Anemia in other chronic diseases classified elsewhere: Secondary | ICD-10-CM

## 2014-02-20 DIAGNOSIS — D709 Neutropenia, unspecified: Secondary | ICD-10-CM

## 2014-02-20 DIAGNOSIS — R5081 Fever presenting with conditions classified elsewhere: Secondary | ICD-10-CM

## 2014-02-20 LAB — CBC WITH DIFFERENTIAL/PLATELET
BASO%: 1.2 % (ref 0.0–2.0)
BASOS ABS: 0 10*3/uL (ref 0.0–0.1)
EOS%: 2.3 % (ref 0.0–7.0)
Eosinophils Absolute: 0 10*3/uL (ref 0.0–0.5)
HEMATOCRIT: 33.6 % — AB (ref 34.8–46.6)
HGB: 10.6 g/dL — ABNORMAL LOW (ref 11.6–15.9)
LYMPH#: 1 10*3/uL (ref 0.9–3.3)
LYMPH%: 55.6 % — ABNORMAL HIGH (ref 14.0–49.7)
MCH: 24.4 pg — AB (ref 25.1–34.0)
MCHC: 31.5 g/dL (ref 31.5–36.0)
MCV: 77.2 fL — ABNORMAL LOW (ref 79.5–101.0)
MONO#: 0.3 10*3/uL (ref 0.1–0.9)
MONO%: 19.9 % — ABNORMAL HIGH (ref 0.0–14.0)
NEUT#: 0.4 10*3/uL — CL (ref 1.5–6.5)
NEUT%: 21 % — AB (ref 38.4–76.8)
Platelets: 214 10*3/uL (ref 145–400)
RBC: 4.35 10*6/uL (ref 3.70–5.45)
RDW: 16.7 % — AB (ref 11.2–14.5)
WBC: 1.7 10*3/uL — AB (ref 3.9–10.3)

## 2014-02-20 MED ORDER — DIPHENHYDRAMINE HCL 25 MG PO TABS
25.0000 mg | ORAL_TABLET | ORAL | Status: DC | PRN
Start: 2014-02-20 — End: 2014-02-20
  Administered 2014-02-20: 25 mg via ORAL
  Filled 2014-02-20: qty 1

## 2014-02-20 MED ORDER — IMMUNE GLOBULIN (HUMAN) 10 GM/100ML IV SOLN
1.0000 g/kg | Freq: Once | INTRAVENOUS | Status: AC
  Administered 2014-02-20: 95 g via INTRAVENOUS
  Filled 2014-02-20: qty 950

## 2014-02-20 MED ORDER — DIPHENHYDRAMINE HCL 25 MG PO CAPS
ORAL_CAPSULE | ORAL | Status: AC
  Filled 2014-02-20: qty 1

## 2014-02-20 MED ORDER — ACETAMINOPHEN 325 MG PO TABS
ORAL_TABLET | ORAL | Status: AC
  Filled 2014-02-20: qty 2

## 2014-02-20 MED ORDER — ACETAMINOPHEN 325 MG PO TABS
650.0000 mg | ORAL_TABLET | Freq: Four times a day (QID) | ORAL | Status: DC | PRN
  Administered 2014-02-20: 650 mg via ORAL

## 2014-02-20 MED ORDER — SODIUM CHLORIDE 0.9 % IV SOLN
Freq: Once | INTRAVENOUS | Status: AC
  Administered 2014-02-20: 10:00:00 via INTRAVENOUS

## 2014-02-20 NOTE — Patient Instructions (Signed)

## 2014-02-20 NOTE — Progress Notes (Signed)
[facility] OFFICE PROGRESS NOTE  Morgan Chessman, MD SUMMARY OF HEMATOLOGIC HISTORY: This is a pleasant lady who is being referred here because of background of severe pancytopenia, lymphadenopathy and abnormal lymph node and bone marrow biopsy. Further evaluation suspect the patient may have syphilis. She was treated with doxycycline with worsening pancytopenia. Her antibiotic treatment was discontinued. From 06/02/2013 to 06/09/2013, the patient was admitted to the hospital for further workup for neutropenic fever. She was placed on G-CSF with no response to therapy. Repeat bone marrow biopsy was done. The patient was given broad-spectrum intravenous antibiotics and subsequently discharged for further followup as an outpatient.  On 06/14/2013, I started her on prednisone 60 mg daily. She was readmitted to the hospital again for neutropenic fever. 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 On 08/04/2013, I advised further prednisone taper. She is given G-CSF for severe neutropenia with no response. She was subsequently observed. In August 2015, she was noted to a recurrence of severe neutropenia. She was sent back on prednisone with initial good response to treatment but then relapsed soon after initiation of prednisone taper  on 02/20/2014, she was given IVIG treatment. INTERVAL HISTORY: Morgan Bean 48 y.o. female returns for further follow-up. She feels well. Denies recent infection.  I have reviewed the past medical history, past surgical history, social history and family history with the patient and they are unchanged from previous note.  ALLERGIES:  is allergic to ciprofloxacin.  MEDICATIONS:  Current Outpatient Prescriptions  Medication Sig Dispense Refill  . albuterol (PROVENTIL) (2.5 MG/3ML) 0.083% nebulizer solution Take 2.5 mg by nebulization every 4 (four) hours as needed for wheezing or shortness of  breath.    [name] amLODipine (NORVASC) 10 MG tablet TAKE ONE TABLET DAILY 30 tablet 5  . atorvastatin (LIPITOR) 80 MG tablet Take 1 tablet (80 mg total) by mouth daily. 30 tablet 3  . buPROPion (WELLBUTRIN) 100 MG tablet Take 100 mg by mouth 2 (two) times daily.    . citalopram (CELEXA) 40 MG tablet Take 1 tablet (40 mg total) by mouth daily. 30 tablet 1  . cloNIDine (CATAPRES) 0.3 MG tablet Take 1 tablet (0.3 mg total) by mouth 2 (two) times daily. 60 tablet 3  . clopidogrel (PLAVIX) 75 MG tablet Take 1 tablet (75 mg total) by mouth daily. 30 tablet 11  . ferrous sulfate 325 (65 FE) MG tablet Take 650 mg by mouth daily with breakfast.    . furosemide (LASIX) 40 MG tablet TAKE 1/2 TABLET BY MOUTH TWICE DAILY 30 tablet 1  . hydrOXYzine (ATARAX/VISTARIL) 25 MG tablet Take 25 mg by mouth at bedtime as needed (sleep).    . loratadine (CLARITIN) 10 MG tablet Take 1 tablet (10 mg total) by mouth daily. 30 tablet 3  . Multiple Vitamin (MULTIVITAMIN WITH MINERALS) TABS Take 1 tablet by mouth daily.    [name] omeprazole (PRILOSEC) 40 MG capsule Take 40 mg by mouth daily.    . polyethylene glycol powder (GLYCOLAX/MIRALAX) powder Take 17 g by mouth daily as needed (constipation).     . potassium chloride (K-DUR,KLOR-CON) 10 MEQ tablet Take 1 tablet (10 mEq total) by mouth 2 (two) times daily with a meal. 60 tablet 3  . predniSONE (DELTASONE) 5 MG tablet Take 1 tablet (5 mg total) by mouth daily with breakfast. 30 tablet 0  . traZODone (DESYREL) 100 MG tablet Take 1 tablet (100 mg total) by mouth at bedtime. 30 tablet  1   No current facility-administered medications for this visit.     REVIEW OF SYSTEMS:   Constitutional: Denies fevers, chills or night sweats Eyes: Denies blurriness of vision Ears, nose, mouth, throat, and face: Denies mucositis or sore throat Respiratory: Denies cough, dyspnea or wheezes Cardiovascular: Denies palpitation, chest discomfort or lower extremity swelling Gastrointestinal:  Denies  nausea, heartburn or change in bowel habits Skin: Denies abnormal skin rashes Lymphatics: Denies new lymphadenopathy or easy bruising Neurological:Denies numbness, tingling or new weaknesses Behavioral/Psych: Mood is stable, no new changes  All other systems were reviewed with the patient and are negative.  PHYSICAL EXAMINATION: ECOG PERFORMANCE STATUS: 0 - Asymptomatic  Filed Vitals:   02/20/14 0853  BP: 122/92  Pulse: 100  Temp: 98.1 F (36.7 C)  Resp: 18   Filed Weights   02/20/14 0853  Weight: 207 lb 3.2 oz (93.985 kg)    GENERAL:alert, no distress and comfortable SKIN: skin color, texture, turgor are normal, no rashes or significant lesions EYES: normal, Conjunctiva are pink and non-injected, sclera clear Musculoskeletal:no cyanosis of digits and no clubbing  NEURO: alert & oriented x 3 with fluent speech, no focal motor/sensory deficits  LABORATORY DATA:  I have reviewed the data as listed Results for orders placed or performed in visit on 02/20/14 (from the past 48 hour(s))  CBC with Differential     Status: Abnormal   Collection Time: 02/20/14  8:38 AM  Result Value Ref Range   WBC 1.7 (L) 3.9 - 10.3 10e3/uL   NEUT# 0.4 (LL) 1.5 - 6.5 10e3/uL   HGB 10.6 (L) 11.6 - 15.9 g/dL   HCT 33.6 (L) 34.8 - 46.6 %   Platelets 214 145 - 400 10e3/uL   MCV 77.2 (L) 79.5 - 101.0 fL   MCH 24.4 (L) 25.1 - 34.0 pg   MCHC 31.5 31.5 - 36.0 g/dL   RBC 4.35 3.70 - 5.45 10e6/uL   RDW 16.7 (H) 11.2 - 14.5 %   lymph# 1.0 0.9 - 3.3 10e3/uL   MONO# 0.3 0.1 - 0.9 10e3/uL   Eosinophils Absolute 0.0 0.0 - 0.5 10e3/uL   Basophils Absolute 0.0 0.0 - 0.1 10e3/uL   NEUT% 21.0 (L) 38.4 - 76.8 %   LYMPH% 55.6 (H) 14.0 - 49.7 %   MONO% 19.9 (H) 0.0 - 14.0 %   EOS% 2.3 0.0 - 7.0 %   BASO% 1.2 0.0 - 2.0 %  Hepatitis B surface antigen     Status: None (Preliminary result)   Collection Time: 02/20/14  8:38 AM  Result Value Ref Range   Hepatitis B Surface Ag NEGATIVE NEGATIVE  Hepatitis B  surface antibody     Status: None (Preliminary result)   Collection Time: 02/20/14  8:38 AM  Result Value Ref Range   Hep B S Ab NEG NEGATIVE  Hepatitis B core antibody, IgM     Status: None (Preliminary result)   Collection Time: 02/20/14  8:38 AM  Result Value Ref Range   Hep B C IgM NON REACTIVE NON REACTIVE    Comment: High levels of Hepatitis B Core IgM antibody are detectableduring the acute stage of Hepatitis B. This antibody is usedto differentiate current from past HBV infection.     Lab Results  Component Value Date   WBC 1.7* 02/20/2014   HGB 10.6* 02/20/2014   HCT 33.6* 02/20/2014   MCV 77.2* 02/20/2014   PLT 214 02/20/2014   ASSESSMENT & PLAN:  Neutropenia She has persistent, severe neutropenia, unresponsive to  prednisone and recent G-CSF. She had multiple bone marrow biopsy performed in the past and overall consensus from multiple subspecialties were this is related to cocaine abuse. The patient denies cocaine abuse for over 6 months. Previously, she had nonspecific lymphadenopathy and lymph node biopsy was non-diagnostic. Repeat CT scan show nonspecific lymphadenopathy that overall is improving. In the meantime, she will remain on 5 mg prednisone. After review all the most recent hematology tumor board, I recommend a trial of IVIG. 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 Certainly, we could also try to use of rituximab.     The risks, benefits, side effects of IVIG is still fully discussed with patient and family members and they agreed to proceed. I will see her back in 2 weeks to assess response to treatment.  All questions were answered. The patient knows to call the clinic with any problems, questions or concerns. No barriers to learning was detected.  I spent 30 minutes counseling the patient face to face. The total time spent in the appointment was 40 minutes and more than 50% was on  counseling.     [place], [place], MD 02/20/2014 10:06 PM

## 2014-02-20 NOTE — Progress Notes (Signed)
Patient receivied IVIG 10% [facility] today for the first time; tolerated treatment well with no complaints.

## 2014-02-20 NOTE — Progress Notes (Signed)
Called [facility] about drug replacement for IVIG, per the rep they no longer offer drug replacement.  Patient has 100% [facility] discount.

## 2014-02-20 NOTE — Assessment & Plan Note (Signed)
She has persistent, severe neutropenia, unresponsive to prednisone and recent G-CSF. She had multiple bone marrow biopsy performed in the past and overall consensus from multiple subspecialties were this is related to cocaine abuse. The patient denies cocaine abuse for over 6 months. Previously, she had nonspecific lymphadenopathy and lymph node biopsy was non-diagnostic. Repeat CT scan show nonspecific lymphadenopathy that overall is improving. In the meantime, she will remain on 5 mg prednisone. After review all the most recent hematology tumor board, I recommend a trial of IVIG. 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 Certainly, we could also try to use of rituximab.

## 2014-02-20 NOTE — Telephone Encounter (Signed)
gv and printed appt sched and avs for pt for DEC °

## 2014-02-21 ENCOUNTER — Ambulatory Visit (HOSPITAL_BASED_OUTPATIENT_CLINIC_OR_DEPARTMENT_OTHER): Payer: No Typology Code available for payment source

## 2014-02-21 VITALS — BP 125/91 | HR 85 | Temp 99.2°F | Resp 18

## 2014-02-21 DIAGNOSIS — I10 Essential (primary) hypertension: Secondary | ICD-10-CM

## 2014-02-21 DIAGNOSIS — R5081 Fever presenting with conditions classified elsewhere: Secondary | ICD-10-CM

## 2014-02-21 DIAGNOSIS — D709 Neutropenia, unspecified: Secondary | ICD-10-CM

## 2014-02-21 LAB — HEPATITIS B CORE ANTIBODY, IGM: HEP B C IGM: NONREACTIVE

## 2014-02-21 LAB — HEPATITIS B SURFACE ANTIBODY,QUALITATIVE: HEP B S AB: NEGATIVE

## 2014-02-21 LAB — IGG, IGA, IGM
IGG (IMMUNOGLOBIN G), SERUM: 1830 mg/dL — AB (ref 690–1700)
IgA: 359 mg/dL (ref 69–380)
IgM, Serum: 461 mg/dL — ABNORMAL HIGH (ref 52–322)

## 2014-02-21 LAB — HEPATITIS B SURFACE ANTIGEN: Hepatitis B Surface Ag: NEGATIVE

## 2014-02-21 MED ORDER — SODIUM CHLORIDE 0.9 % IJ SOLN
3.0000 mL | Freq: Once | INTRAMUSCULAR | Status: DC | PRN
  Filled 2014-02-21: qty 10

## 2014-02-21 MED ORDER — IMMUNE GLOBULIN (HUMAN) 10 GM/100ML IV SOLN
1.0000 g/kg | Freq: Once | INTRAVENOUS | Status: DC
  Filled 2014-02-21: qty 950

## 2014-02-21 MED ORDER — CLONIDINE HCL 0.1 MG PO TABS
0.2000 mg | ORAL_TABLET | Freq: Once | ORAL | Status: AC
  Administered 2014-02-21: 0.2 mg via ORAL

## 2014-02-21 MED ORDER — OXYCODONE-ACETAMINOPHEN 5-325 MG PO TABS
1.0000 | ORAL_TABLET | Freq: Once | ORAL | Status: AC
  Administered 2014-02-21: 1 via ORAL

## 2014-02-21 MED ORDER — IMMUNE GLOBULIN (HUMAN) 20 GM/200ML IV SOLN
1.0500 g/kg | Freq: Once | INTRAVENOUS | Status: AC
  Administered 2014-02-21: 100 g via INTRAVENOUS
  Filled 2014-02-21: qty 1000

## 2014-02-21 MED ORDER — OXYCODONE-ACETAMINOPHEN 5-325 MG PO TABS
ORAL_TABLET | ORAL | Status: AC
  Filled 2014-02-21: qty 1

## 2014-02-21 MED ORDER — CLONIDINE HCL 0.1 MG PO TABS
ORAL_TABLET | ORAL | Status: AC
  Filled 2014-02-21: qty 2

## 2014-02-21 NOTE — Progress Notes (Signed)
At the time patient's IVIG rate increased to 451 ml/hr, patient reports headache 6/10 with blurred vision. Patient's BP has been elevated since arrival, see flowsheet. Patient did not take her BP meds at home today. Orders received per Dr. Alvy Bimler: clonidine 0.2 mg po and percocet 1 tablet now and to maintain infusion at this current rate.

## 2014-02-21 NOTE — Progress Notes (Signed)
@   1200 pt. States headache easing up and almost gone.  Still with some blurred vision but this is getting better too.  Continuing to monitor VS.

## 2014-02-21 NOTE — Patient Instructions (Signed)

## 2014-02-22 ENCOUNTER — Encounter: Payer: Self-pay | Admitting: Hematology and Oncology

## 2014-02-22 ENCOUNTER — Other Ambulatory Visit: Payer: Self-pay | Admitting: Hematology and Oncology

## 2014-02-22 ENCOUNTER — Telehealth: Payer: Self-pay | Admitting: *Deleted

## 2014-02-22 DIAGNOSIS — R51 Headache: Secondary | ICD-10-CM

## 2014-02-22 DIAGNOSIS — R519 Headache, unspecified: Secondary | ICD-10-CM

## 2014-02-22 HISTORY — DX: Headache, unspecified: R51.9

## 2014-02-22 MED ORDER — BUTALBITAL-APAP-CAFFEINE 50-325-40 MG PO TABS: 1.0000 | ORAL_TABLET | Freq: Four times a day (QID) | ORAL | Status: DC | PRN

## 2014-02-22 NOTE — Telephone Encounter (Signed)
DID NOT RECEIVED A RETURN CALL FROM PT. CALLED PT. AGAIN. SHE DID NOT RECEIVE VOICE MAIL MESSAGE. INFORMED PT. OF DR.GORSUCH'S INSTRUCTIONS. SHE WILL COME TOMORROW FOR THE FIORICET PRESCRIPTION IF NEEDED.

## 2014-02-22 NOTE — Telephone Encounter (Signed)
WHEN PT. ARRIVED HOME SHE TOOK HER BLOOD PRESSURE MEDICATION AND IBUPROFEN FOR HER HEADACHE. HER HEADACHE WAS ON A SCALE OF FIVE AT BEDTIME BUT PT. WAS ABLE TO GO TO SLEEP. THIS MORNING PT. TOOK HER BLOOD PRESSURE MEDICATION AND HER BLOOD PRESSURE READING WAS "FINE" [PT. DID NOT REMEMBER THE NUMBERS]. PT.'S HEAD IS STILL HURTING AT A SCALE OF TEN. SHE HAS NOT TAKEN ANYTHING FOR PAIN. NO PROBLEMS WITH HER VISION.

## 2014-02-22 NOTE — Telephone Encounter (Addendum)
[facility] ORDER AND READ BACK TO [place]- PT. MAY TAKE IBUPROFEN 400MG  EVERY SIX HOURS AS NEEDED FOR PAIN. MAY TRY DRINKING TEA AND CARBONATED DRINKS. PT. MAY ALSO PICK UP A PRESCRIPTION FOR FIORICET. LEFT MESSAGE ON PT.'S VOICE MAIL TO RETURN A CALL TO [facility].

## 2014-03-06 ENCOUNTER — Ambulatory Visit (HOSPITAL_BASED_OUTPATIENT_CLINIC_OR_DEPARTMENT_OTHER): Payer: Self-pay | Admitting: Hematology and Oncology

## 2014-03-06 ENCOUNTER — Encounter: Payer: Self-pay | Admitting: Hematology and Oncology

## 2014-03-06 ENCOUNTER — Other Ambulatory Visit (HOSPITAL_BASED_OUTPATIENT_CLINIC_OR_DEPARTMENT_OTHER): Payer: Self-pay

## 2014-03-06 ENCOUNTER — Telehealth: Payer: Self-pay | Admitting: Hematology and Oncology

## 2014-03-06 VITALS — BP 110/75 | HR 81 | Temp 98.6°F | Resp 18 | Ht 66.0 in | Wt 209.9 lb

## 2014-03-06 DIAGNOSIS — D709 Neutropenia, unspecified: Secondary | ICD-10-CM

## 2014-03-06 DIAGNOSIS — D638 Anemia in other chronic diseases classified elsewhere: Secondary | ICD-10-CM

## 2014-03-06 LAB — COMPREHENSIVE METABOLIC PANEL (CC13)
ALK PHOS: 123 U/L (ref 40–150)
ALT: 27 U/L (ref 0–55)
AST: 21 U/L (ref 5–34)
Albumin: 3 g/dL — ABNORMAL LOW (ref 3.5–5.0)
Anion Gap: 9 mEq/L (ref 3–11)
BUN: 13.3 mg/dL (ref 7.0–26.0)
CALCIUM: 8.6 mg/dL (ref 8.4–10.4)
CO2: 20 mEq/L — ABNORMAL LOW (ref 22–29)
CREATININE: 1.1 mg/dL (ref 0.6–1.1)
Chloride: 111 mEq/L — ABNORMAL HIGH (ref 98–109)
EGFR: 71 mL/min/{1.73_m2} — ABNORMAL LOW (ref >90)
Glucose: 96 mg/dl (ref 70–140)
Potassium: 4 mEq/L (ref 3.5–5.1)
Sodium: 141 mEq/L (ref 136–145)
Total Bilirubin: 0.23 mg/dL (ref 0.20–1.20)
Total Protein: 8.1 g/dL (ref 6.4–8.3)

## 2014-03-06 LAB — CBC WITH DIFFERENTIAL/PLATELET
BASO%: 0.9 % (ref 0.0–2.0)
Basophils Absolute: 0 10*3/uL (ref 0.0–0.1)
EOS%: 4.4 % (ref 0.0–7.0)
Eosinophils Absolute: 0.1 10*3/uL (ref 0.0–0.5)
HCT: 31.8 % — ABNORMAL LOW (ref 34.8–46.6)
HGB: 10.1 g/dL — ABNORMAL LOW (ref 11.6–15.9)
LYMPH%: 55.9 % — AB (ref 14.0–49.7)
MCH: 25.1 pg (ref 25.1–34.0)
MCHC: 31.8 g/dL (ref 31.5–36.0)
MCV: 79.1 fL — ABNORMAL LOW (ref 79.5–101.0)
MONO#: 0.9 10*3/uL (ref 0.1–0.9)
MONO%: 37.6 % — ABNORMAL HIGH (ref 0.0–14.0)
NEUT#: 0 10*3/uL — CL (ref 1.5–6.5)
NEUT%: 1.2 % — ABNORMAL LOW (ref 38.4–76.8)
NRBC: 0 % (ref 0–0)
PLATELETS: 207 10*3/uL (ref 145–400)
RBC: 4.02 10*6/uL (ref 3.70–5.45)
RDW: 18 % — ABNORMAL HIGH (ref 11.2–14.5)
WBC: 2.3 10*3/uL — ABNORMAL LOW (ref 3.9–10.3)
lymph#: 1.3 10*3/uL (ref 0.9–3.3)

## 2014-03-06 LAB — LACTATE DEHYDROGENASE (CC13): LDH: 126 U/L (ref 125–245)

## 2014-03-06 NOTE — Assessment & Plan Note (Signed)
This is likely anemia of chronic disease. The patient denies recent history of bleeding such as epistaxis, hematuria or hematochezia. She is asymptomatic from the anemia. We will observe for now.  She does not require transfusion now.   

## 2014-03-06 NOTE — Progress Notes (Signed)
[name]  [name], MD SUMMARY OF HEMATOLOGIC HISTORY:  This is a pleasant lady who is being referred here because of background of severe pancytopenia, lymphadenopathy and abnormal lymph node and bone marrow biopsy. Further evaluation suspect the patient may have syphilis. She was treated with doxycycline with worsening pancytopenia. Her antibiotic treatment was discontinued. From 06/02/2013 to 06/09/2013, the patient was admitted to the hospital for further workup for neutropenic fever. She was placed on G-CSF with no response to therapy. Repeat bone marrow biopsy was done. The patient was given broad-spectrum intravenous antibiotics and subsequently discharged for further followup as an outpatient.  On 06/14/2013, I started her on prednisone 60 mg daily. She was readmitted to the hospital again for neutropenic fever. 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 On 08/04/2013, I advised further prednisone taper. She is given G-CSF for severe neutropenia with no response. She was subsequently observed. In August 2015, she was noted to a recurrence of severe neutropenia. She was sent back on prednisone with initial good response to treatment but then relapsed soon after initiation of prednisone taper  on 02/20/2014, she was given IVIG treatment. On 03/06/2014, prednisone taper is initiated to 2.5 mg daily. INTERVAL HISTORY: Morgan Bean 48 y.o. female returns for further follow-up. She feels well. Denies recent infection. Denies new lymphadenopathy.  I have reviewed the past medical history, past surgical history, social history and family history with the patient and they are unchanged from previous note.  ALLERGIES:  is allergic to ciprofloxacin.  MEDICATIONS:  Current Outpatient Prescriptions  Medication Sig Dispense Refill  . albuterol (PROVENTIL) (2.5 MG/3ML) 0.083% nebulizer  solution Take 2.5 mg by nebulization every 4 (four) hours as needed for wheezing or shortness of breath.    [name] amLODipine (NORVASC) 10 MG tablet TAKE ONE TABLET DAILY 30 tablet 5  . atorvastatin (LIPITOR) 80 MG tablet Take 1 tablet (80 mg total) by mouth daily. 30 tablet 3  . buPROPion (WELLBUTRIN) 100 MG tablet Take 100 mg by mouth 2 (two) times daily.    . butalbital-acetaminophen-caffeine (FIORICET) 50-325-40 MG per tablet Take 1 tablet by mouth every 6 (six) hours as needed for headache. 30 tablet 0  . citalopram (CELEXA) 40 MG tablet Take 1 tablet (40 mg total) by mouth daily. 30 tablet 1  . cloNIDine (CATAPRES) 0.3 MG tablet Take 1 tablet (0.3 mg total) by mouth 2 (two) times daily. 60 tablet 3  . clopidogrel (PLAVIX) 75 MG tablet Take 1 tablet (75 mg total) by mouth daily. 30 tablet 11  . ferrous sulfate 325 (65 FE) MG tablet Take 650 mg by mouth daily with breakfast.    . furosemide (LASIX) 40 MG tablet TAKE 1/2 TABLET BY MOUTH TWICE DAILY 30 tablet 1  . hydrOXYzine (ATARAX/VISTARIL) 25 MG tablet Take 25 mg by mouth at bedtime as needed (sleep).    . loratadine (CLARITIN) 10 MG tablet Take 1 tablet (10 mg total) by mouth daily. 30 tablet 3  . Multiple Vitamin (MULTIVITAMIN WITH MINERALS) TABS Take 1 tablet by mouth daily.    [name] omeprazole (PRILOSEC) 40 MG capsule Take 40 mg by mouth daily.    . polyethylene glycol powder (GLYCOLAX/MIRALAX) powder Take 17 g by mouth daily as needed (constipation).     . potassium chloride (K-DUR,KLOR-CON) 10 MEQ tablet Take 1 tablet (10 mEq total) by mouth 2 (two) times daily with a meal. 60 tablet 3  .  predniSONE (DELTASONE) 5 MG tablet Take 1 tablet (5 mg total) by mouth daily with breakfast. 30 tablet 0  . traZODone (DESYREL) 100 MG tablet Take 1 tablet (100 mg total) by mouth at bedtime. 30 tablet 1   No current facility-administered medications for this visit.     REVIEW OF SYSTEMS:   Constitutional: Denies fevers, chills or night sweats Eyes:  Denies blurriness of vision Ears, nose, mouth, throat, and face: Denies mucositis or sore throat Respiratory: Denies cough, dyspnea or wheezes Cardiovascular: Denies palpitation, chest discomfort or lower extremity swelling Gastrointestinal:  Denies nausea, heartburn or change in bowel habits Skin: Denies abnormal skin rashes Lymphatics: Denies new lymphadenopathy or easy bruising Neurological:Denies numbness, tingling or new weaknesses Behavioral/Psych: Mood is stable, no new changes  All other systems were reviewed with the patient and are negative.  PHYSICAL EXAMINATION: ECOG PERFORMANCE STATUS: 0 - Asymptomatic  Filed Vitals:   03/06/14 0826  BP: 110/75  Pulse: 81  Temp: 98.6 F (37 C)  Resp: 18   Filed Weights   03/06/14 0826  Weight: 209 lb 14.4 oz (95.21 kg)    GENERAL:alert, no distress and comfortable SKIN: skin color, texture, turgor are normal, no rashes or significant lesions EYES: normal, Conjunctiva are pink and non-injected, sclera clear OROPHARYNX:no exudate, no erythema and lips, buccal mucosa, and tongue normal . Poor dentition is noted NECK: supple, thyroid normal size, non-tender, without nodularity LYMPH:  no palpable lymphadenopathy in the cervical, axillary or inguinal LUNGS: clear to auscultation and percussion with normal breathing effort HEART: regular rate & rhythm and no murmurs and no lower extremity edema ABDOMEN:abdomen soft, non-tender and normal bowel sounds Musculoskeletal:no cyanosis of digits and no clubbing  NEURO: alert & oriented x 3 with fluent speech, no focal motor/sensory deficits  LABORATORY DATA:  I have reviewed the data as listed Results for orders placed or performed in visit on 03/06/14 (from the past 48 hour(s))  CBC with Differential     Status: Abnormal   Collection Time: 03/06/14  7:59 AM  Result Value Ref Range   WBC 2.3 (L) 3.9 - 10.3 10e3/uL   NEUT# 0.0 (LL) 1.5 - 6.5 10e3/uL   HGB 10.1 (L) 11.6 - 15.9 g/dL   HCT  31.8 (L) 34.8 - 46.6 %   Platelets 207 145 - 400 10e3/uL   MCV 79.1 (L) 79.5 - 101.0 fL   MCH 25.1 25.1 - 34.0 pg   MCHC 31.8 31.5 - 36.0 g/dL   RBC 4.02 3.70 - 5.45 10e6/uL   RDW 18.0 (H) 11.2 - 14.5 %   lymph# 1.3 0.9 - 3.3 10e3/uL   MONO# 0.9 0.1 - 0.9 10e3/uL   Eosinophils Absolute 0.1 0.0 - 0.5 10e3/uL   Basophils Absolute 0.0 0.0 - 0.1 10e3/uL   NEUT% 1.2 (L) 38.4 - 76.8 %   LYMPH% 55.9 (H) 14.0 - 49.7 %   MONO% 37.6 (H) 0.0 - 14.0 %   EOS% 4.4 0.0 - 7.0 %   BASO% 0.9 0.0 - 2.0 %   nRBC 0 0 - 0 %    Lab Results  Component Value Date   WBC 2.3* 03/06/2014   HGB 10.1* 03/06/2014   HCT 31.8* 03/06/2014   MCV 79.1* 03/06/2014   PLT 207 03/06/2014   ASSESSMENT & PLAN:  Neutropenia She has persistent, severe neutropenia, unresponsive to prednisone and recent G-CSF. She had multiple bone marrow biopsy performed in the past and overall consensus from multiple subspecialties were this is related to cocaine  abuse. The patient denies cocaine abuse for over 6 months. Previously, she had nonspecific lymphadenopathy and lymph node biopsy was non-diagnostic. Repeat CT scan show nonspecific lymphadenopathy that overall is improving. In the meantime, she continue prednisone taper to 2.5 mg. After review of her case at the most recent hematology tumor board, I recommend a trial of IVIG.  She has mild response to IVIG with improvement of her total white count. I plan to repeat treatment again at the end of the month.  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 Certainly, we could also try to use of rituximab.        Anemia in chronic illness This is likely anemia of chronic disease. The patient denies recent history of bleeding such as epistaxis, hematuria or hematochezia. She is asymptomatic from the anemia. We will observe for now.  She does not require transfusion now.    All questions were answered. The  patient knows to call the clinic with any problems, questions or concerns. No barriers to learning was detected.  I spent 25 minutes counseling the patient face to face. The total time spent in the appointment was 30 minutes and more than 50% was on counseling.     [facility], [place], MD 03/06/2014 8:38 AM

## 2014-03-06 NOTE — Assessment & Plan Note (Signed)
She has persistent, severe neutropenia, unresponsive to prednisone and recent G-CSF. She had multiple bone marrow biopsy performed in the past and overall consensus from multiple subspecialties were this is related to cocaine abuse. The patient denies cocaine abuse for over 6 months. Previously, she had nonspecific lymphadenopathy and lymph node biopsy was non-diagnostic. Repeat CT scan show nonspecific lymphadenopathy that overall is improving. In the meantime, she continue prednisone taper to 2.5 mg. After review of her case at the most recent hematology tumor board, I recommend a trial of IVIG.  She has mild response to IVIG with improvement of her total white count. I plan to repeat treatment again at the end of the month.  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 Certainly, we could also try to use of rituximab.

## 2014-03-06 NOTE — Telephone Encounter (Signed)
Gave avs & cal forDec. Sent mess to sch tx. °

## 2014-03-21 ENCOUNTER — Ambulatory Visit (HOSPITAL_BASED_OUTPATIENT_CLINIC_OR_DEPARTMENT_OTHER): Payer: Self-pay | Admitting: Hematology and Oncology

## 2014-03-21 ENCOUNTER — Ambulatory Visit: Payer: Self-pay

## 2014-03-21 ENCOUNTER — Telehealth: Payer: Self-pay | Admitting: Hematology and Oncology

## 2014-03-21 ENCOUNTER — Encounter: Payer: Self-pay | Admitting: Hematology and Oncology

## 2014-03-21 ENCOUNTER — Other Ambulatory Visit (HOSPITAL_BASED_OUTPATIENT_CLINIC_OR_DEPARTMENT_OTHER): Payer: Self-pay

## 2014-03-21 VITALS — BP 106/73 | HR 81 | Temp 98.4°F | Resp 18 | Ht 66.0 in | Wt 206.8 lb

## 2014-03-21 DIAGNOSIS — D709 Neutropenia, unspecified: Secondary | ICD-10-CM

## 2014-03-21 DIAGNOSIS — D638 Anemia in other chronic diseases classified elsewhere: Secondary | ICD-10-CM

## 2014-03-21 LAB — MANUAL DIFFERENTIAL
ALC: 1.6 10*3/uL (ref 0.9–3.3)
ANC (CHCC manual diff): 0 10*3/uL — CL (ref 1.5–6.5)
BAND NEUTROPHILS: 0 % (ref 0–10)
Basophil: 0 % (ref 0–2)
Blasts: 0 % (ref 0–0)
EOS%: 2 % (ref 0–7)
LYMPH: 70 % — ABNORMAL HIGH (ref 14–49)
MONO: 28 % — AB (ref 0–14)
MYELOCYTES: 0 % (ref 0–0)
Metamyelocytes: 0 % (ref 0–0)
NRBC: 0 % (ref 0–0)
Other Cell: 0 % (ref 0–0)
PLT EST: ADEQUATE
PROMYELO: 0 % (ref 0–0)
SEG: 0 % — ABNORMAL LOW (ref 38–77)
Variant Lymph: 0 % (ref 0–0)

## 2014-03-21 LAB — CBC WITH DIFFERENTIAL/PLATELET
HEMATOCRIT: 31.9 % — AB (ref 34.8–46.6)
HEMOGLOBIN: 10 g/dL — AB (ref 11.6–15.9)
MCH: 24.9 pg — AB (ref 25.1–34.0)
MCHC: 31.3 g/dL — ABNORMAL LOW (ref 31.5–36.0)
MCV: 79.4 fL — AB (ref 79.5–101.0)
Platelets: 219 10*3/uL (ref 145–400)
RBC: 4.02 10*6/uL (ref 3.70–5.45)
RDW: 16.6 % — ABNORMAL HIGH (ref 11.2–14.5)
WBC: 2.3 10*3/uL — ABNORMAL LOW (ref 3.9–10.3)

## 2014-03-21 NOTE — Assessment & Plan Note (Signed)
This is likely anemia of chronic disease. The patient denies recent history of bleeding such as epistaxis, hematuria or hematochezia. She is asymptomatic from the anemia. We will observe for now.  She does not require transfusion now.   

## 2014-03-21 NOTE — Telephone Encounter (Signed)
gv adn printed appt sched and avs for pt for Jan 2016 °

## 2014-03-21 NOTE — Progress Notes (Signed)
[place] OFFICE PROGRESS NOTE  Morgan Chessman, MD SUMMARY OF HEMATOLOGIC HISTORY:  This is a pleasant lady who is being referred here because of background of severe pancytopenia, lymphadenopathy and abnormal lymph node and bone marrow biopsy. Further evaluation suspect the patient may have syphilis. She was treated with doxycycline with worsening pancytopenia. Her antibiotic treatment was discontinued. From 06/02/2013 to 06/09/2013, the patient was admitted to the hospital for further workup for neutropenic fever. She was placed on G-CSF with no response to therapy. Repeat bone marrow biopsy was done. The patient was given broad-spectrum intravenous antibiotics and subsequently discharged for further followup as an outpatient.  On 06/14/2013, I started her on prednisone 60 mg daily. She was readmitted to the hospital again for neutropenic fever. 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 On 08/04/2013, I advised further prednisone taper. She is given G-CSF for severe neutropenia with no response. She was subsequently observed. In August 2015, she was noted to a recurrence of severe neutropenia. She was sent back on prednisone with initial good response to treatment but then relapsed soon after initiation of prednisone taper  on 02/20/2014, she was given IVIG treatment. On 03/06/2014, prednisone taper is initiated to 2.5 mg daily. INTERVAL HISTORY: Morgan Bean 48 y.o. female returns for further follow-up. She has excellent energy level. Denies recent infection. The patient denies any recent signs or symptoms of bleeding such as spontaneous epistaxis, hematuria or hematochezia.   I have reviewed the past medical history, past surgical history, social history and family history with the patient and they are unchanged from previous note.  ALLERGIES:  is allergic to ciprofloxacin.  MEDICATIONS:  Current Outpatient  Prescriptions  Medication Sig Dispense Refill  . albuterol (PROVENTIL) (2.5 MG/3ML) 0.083% nebulizer solution Take 2.5 mg by nebulization every 4 (four) hours as needed for wheezing or shortness of breath.    [name] amLODipine (NORVASC) 10 MG tablet TAKE ONE TABLET DAILY 30 tablet 5  . atorvastatin (LIPITOR) 80 MG tablet Take 1 tablet (80 mg total) by mouth daily. 30 tablet 3  . buPROPion (WELLBUTRIN) 100 MG tablet Take 100 mg by mouth 2 (two) times daily.    . butalbital-acetaminophen-caffeine (FIORICET) 50-325-40 MG per tablet Take 1 tablet by mouth every 6 (six) hours as needed for headache. 30 tablet 0  . citalopram (CELEXA) 40 MG tablet Take 1 tablet (40 mg total) by mouth daily. 30 tablet 1  . cloNIDine (CATAPRES) 0.3 MG tablet Take 1 tablet (0.3 mg total) by mouth 2 (two) times daily. 60 tablet 3  . clopidogrel (PLAVIX) 75 MG tablet Take 1 tablet (75 mg total) by mouth daily. 30 tablet 11  . ferrous sulfate 325 (65 FE) MG tablet Take 650 mg by mouth daily with breakfast.    . furosemide (LASIX) 40 MG tablet TAKE 1/2 TABLET BY MOUTH TWICE DAILY 30 tablet 1  . hydrOXYzine (ATARAX/VISTARIL) 25 MG tablet Take 25 mg by mouth at bedtime as needed (sleep).    . loratadine (CLARITIN) 10 MG tablet Take 1 tablet (10 mg total) by mouth daily. 30 tablet 3  . Multiple Vitamin (MULTIVITAMIN WITH MINERALS) TABS Take 1 tablet by mouth daily.    [name] omeprazole (PRILOSEC) 40 MG capsule Take 40 mg by mouth daily.    . polyethylene glycol powder (GLYCOLAX/MIRALAX) powder Take 17 g by mouth daily as needed (constipation).     . potassium chloride (K-DUR,KLOR-CON) 10 MEQ tablet Take 1 tablet (  10 mEq total) by mouth 2 (two) times daily with a meal. 60 tablet 3  . predniSONE (DELTASONE) 5 MG tablet Take 1 tablet (5 mg total) by mouth daily with breakfast. 30 tablet 0  . traZODone (DESYREL) 100 MG tablet Take 1 tablet (100 mg total) by mouth at bedtime. 30 tablet 1   No current facility-administered medications for this  visit.     REVIEW OF SYSTEMS:   Constitutional: Denies fevers, chills or night sweats Eyes: Denies blurriness of vision Ears, nose, mouth, throat, and face: Denies mucositis or sore throat Respiratory: Denies cough, dyspnea or wheezes Cardiovascular: Denies palpitation, chest discomfort or lower extremity swelling Gastrointestinal:  Denies nausea, heartburn or change in bowel habits Skin: Denies abnormal skin rashes Lymphatics: Denies new lymphadenopathy or easy bruising Neurological:Denies numbness, tingling or new weaknesses Behavioral/Psych: Mood is stable, no new changes  All other systems were reviewed with the patient and are negative.  PHYSICAL EXAMINATION: ECOG PERFORMANCE STATUS: 0 - Asymptomatic  Filed Vitals:   03/21/14 0900  BP: 106/73  Pulse: 81  Temp: 98.4 F (36.9 C)  Resp: 18   Filed Weights   03/21/14 0900  Weight: 206 lb 12.8 oz (93.804 kg)    GENERAL:alert, no distress and comfortable SKIN: skin color, texture, turgor are normal, no rashes or significant lesions EYES: normal, Conjunctiva are pink and non-injected, sclera clear OROPHARYNX:no exudate, no erythema and lips, buccal mucosa, and tongue normal  Musculoskeletal:no cyanosis of digits and no clubbing  NEURO: alert & oriented x 3 with fluent speech, no focal motor/sensory deficits  LABORATORY DATA:  I have reviewed the data as listed Results for orders placed or performed in visit on 03/21/14 (from the past 48 hour(s))  CBC with Differential     Status: Abnormal   Collection Time: 03/21/14  8:42 AM  Result Value Ref Range   WBC 2.3 (L) 3.9 - 10.3 10e3/uL   HGB 10.0 (L) 11.6 - 15.9 g/dL   HCT 31.9 (L) 34.8 - 46.6 %   Platelets 219 145 - 400 10e3/uL   MCV 79.4 (L) 79.5 - 101.0 fL   MCH 24.9 (L) 25.1 - 34.0 pg   MCHC 31.3 (L) 31.5 - 36.0 g/dL   RBC 4.02 3.70 - 5.45 10e6/uL   RDW 16.6 (H) 11.2 - 14.5 %  Manual Differential [facility]     Status: Abnormal   Collection Time: 03/21/14  8:42 AM   Result Value Ref Range   ANC ([facility] manual diff) 0.0 (LL) 1.5 - 6.5 10e3/uL   ALC 1.6 0.9 - 3.3 10e3/uL   SEG 0 (L) 38 - 77 %   Band Neutrophils 0 0 - 10 %   LYMPH 70 (H) 14 - 49 %   MONO 28 (H) 0 - 14 %   EOS 2 0 - 7 %   Basophil 0 0 - 2 %   Metamyelocytes 0 0 - 0 %   Myelocytes 0 0 - 0 %   PROMYELO 0 0 - 0 %   Blasts 0 0 - 0 %   Variant Lymph 0 0 - 0 %   Other Cell 0 0 - 0 %   nRBC 0 0 - 0 %   Polychromasia Slight Slight   Ovalocytes Few Negative   Helmet Cell Few Negative   White Cell Comments Variant Lymphs    PLT EST Adequate Adequate    Lab Results  Component Value Date   WBC 2.3* 03/21/2014   HGB 10.0* 03/21/2014  HCT 31.9* 03/21/2014   MCV 79.4* 03/21/2014   PLT 219 03/21/2014   ASSESSMENT & PLAN:  Neutropenia She has persistent, severe neutropenia, unresponsive to prednisone and recent G-CSF. She had multiple bone marrow biopsy performed in the past and overall consensus from multiple subspecialties were this is related to cocaine abuse. The patient denies cocaine abuse for over 6 months. Previously, she had nonspecific lymphadenopathy and lymph node biopsy was non-diagnostic. Repeat CT scan show nonspecific lymphadenopathy that overall is improving. In the meantime, she continue prednisone taper to 2.5 mg. After review of her case at the most recent hematology tumor board, I recommend a trial of IVIG.  She has no response to IVIG. We discussed other options including rituximab, observation, or referral to tertiary center. Due to lack of symptoms, the patient elected for observation only and I think it is reasonable. I will see her back in a month with repeat blood work history and physical examination. I reinforced the importance of hand hygiene and close communication with me. Should she suspect signs of infection, she will call me immediately for further evaluation and management.  Anemia in chronic illness This is likely anemia of chronic disease. The patient  denies recent history of bleeding such as epistaxis, hematuria or hematochezia. She is asymptomatic from the anemia. We will observe for now.  She does not require transfusion now.    All questions were answered. The patient knows to call the clinic with any problems, questions or concerns. No barriers to learning was detected.  I spent 25 minutes counseling the patient face to face. The total time spent in the appointment was 30 minutes and more than 50% was on counseling.     Twain Harte, [place], MD 03/21/2014 11:25 AM

## 2014-03-21 NOTE — Assessment & Plan Note (Signed)
She has persistent, severe neutropenia, unresponsive to prednisone and recent G-CSF. She had multiple bone marrow biopsy performed in the past and overall consensus from multiple subspecialties were this is related to cocaine abuse. The patient denies cocaine abuse for over 6 months. Previously, she had nonspecific lymphadenopathy and lymph node biopsy was non-diagnostic. Repeat CT scan show nonspecific lymphadenopathy that overall is improving. In the meantime, she continue prednisone taper to 2.5 mg. After review of her case at the most recent hematology tumor board, I recommend a trial of IVIG.  She has no response to IVIG. We discussed other options including rituximab, observation, or referral to tertiary center. Due to lack of symptoms, the patient elected for observation only and I think it is reasonable. I will see her back in a month with repeat blood work history and physical examination. I reinforced the importance of hand hygiene and close communication with me. Should she suspect signs of infection, she will call me immediately for further evaluation and management.

## 2014-03-22 ENCOUNTER — Ambulatory Visit: Payer: Self-pay

## 2014-03-26 ENCOUNTER — Other Ambulatory Visit: Payer: Self-pay | Admitting: Hematology and Oncology

## 2014-03-29 ENCOUNTER — Other Ambulatory Visit: Payer: Self-pay | Admitting: Cardiology

## 2014-04-12 ENCOUNTER — Emergency Department (HOSPITAL_COMMUNITY): Payer: Self-pay

## 2014-04-12 ENCOUNTER — Encounter (HOSPITAL_COMMUNITY): Payer: Self-pay | Admitting: *Deleted

## 2014-04-12 ENCOUNTER — Emergency Department (HOSPITAL_COMMUNITY)
Admission: EM | Admit: 2014-04-12 | Discharge: 2014-04-12 | Disposition: A | Discharge status: Home / Self Care | Payer: Self-pay | Attending: Emergency Medicine | Admitting: Emergency Medicine

## 2014-04-12 DIAGNOSIS — F419 Anxiety disorder, unspecified: Secondary | ICD-10-CM | POA: Insufficient documentation

## 2014-04-12 DIAGNOSIS — Z79899 Other long term (current) drug therapy: Secondary | ICD-10-CM | POA: Insufficient documentation

## 2014-04-12 DIAGNOSIS — Z8719 Personal history of other diseases of the digestive system: Secondary | ICD-10-CM | POA: Insufficient documentation

## 2014-04-12 DIAGNOSIS — Z7902 Long term (current) use of antithrombotics/antiplatelets: Secondary | ICD-10-CM | POA: Insufficient documentation

## 2014-04-12 DIAGNOSIS — E785 Hyperlipidemia, unspecified: Secondary | ICD-10-CM | POA: Insufficient documentation

## 2014-04-12 DIAGNOSIS — D61818 Other pancytopenia: Secondary | ICD-10-CM | POA: Insufficient documentation

## 2014-04-12 DIAGNOSIS — F329 Major depressive disorder, single episode, unspecified: Secondary | ICD-10-CM | POA: Insufficient documentation

## 2014-04-12 DIAGNOSIS — N179 Acute kidney failure, unspecified: Secondary | ICD-10-CM | POA: Insufficient documentation

## 2014-04-12 DIAGNOSIS — Z7952 Long term (current) use of systemic steroids: Secondary | ICD-10-CM | POA: Insufficient documentation

## 2014-04-12 DIAGNOSIS — R0602 Shortness of breath: Secondary | ICD-10-CM

## 2014-04-12 DIAGNOSIS — Z8739 Personal history of other diseases of the musculoskeletal system and connective tissue: Secondary | ICD-10-CM | POA: Insufficient documentation

## 2014-04-12 DIAGNOSIS — D509 Iron deficiency anemia, unspecified: Secondary | ICD-10-CM | POA: Insufficient documentation

## 2014-04-12 DIAGNOSIS — Z8619 Personal history of other infectious and parasitic diseases: Secondary | ICD-10-CM | POA: Insufficient documentation

## 2014-04-12 DIAGNOSIS — Z87891 Personal history of nicotine dependence: Secondary | ICD-10-CM | POA: Insufficient documentation

## 2014-04-12 DIAGNOSIS — Z8673 Personal history of transient ischemic attack (TIA), and cerebral infarction without residual deficits: Secondary | ICD-10-CM | POA: Insufficient documentation

## 2014-04-12 DIAGNOSIS — I10 Essential (primary) hypertension: Secondary | ICD-10-CM | POA: Insufficient documentation

## 2014-04-12 DIAGNOSIS — R079 Chest pain, unspecified: Secondary | ICD-10-CM

## 2014-04-12 DIAGNOSIS — J189 Pneumonia, unspecified organism: Secondary | ICD-10-CM | POA: Insufficient documentation

## 2014-04-12 DIAGNOSIS — I5032 Chronic diastolic (congestive) heart failure: Secondary | ICD-10-CM | POA: Insufficient documentation

## 2014-04-12 DIAGNOSIS — Z9889 Other specified postprocedural states: Secondary | ICD-10-CM | POA: Insufficient documentation

## 2014-04-12 DIAGNOSIS — R059 Cough, unspecified: Secondary | ICD-10-CM

## 2014-04-12 DIAGNOSIS — J45901 Unspecified asthma with (acute) exacerbation: Secondary | ICD-10-CM | POA: Insufficient documentation

## 2014-04-12 DIAGNOSIS — J181 Lobar pneumonia, unspecified organism: Secondary | ICD-10-CM

## 2014-04-12 DIAGNOSIS — I2510 Atherosclerotic heart disease of native coronary artery without angina pectoris: Secondary | ICD-10-CM | POA: Insufficient documentation

## 2014-04-12 DIAGNOSIS — R05 Cough: Secondary | ICD-10-CM

## 2014-04-12 LAB — CBC
HCT: 30.9 % — ABNORMAL LOW (ref 36.0–46.0)
HEMOGLOBIN: 10 g/dL — AB (ref 12.0–15.0)
MCH: 24.6 pg — ABNORMAL LOW (ref 26.0–34.0)
MCHC: 32.4 g/dL (ref 30.0–36.0)
MCV: 76.1 fL — ABNORMAL LOW (ref 78.0–100.0)
Platelets: 275 10*3/uL (ref 150–400)
RBC: 4.06 MIL/uL (ref 3.87–5.11)
RDW: 15 % (ref 11.5–15.5)
WBC: 2.2 10*3/uL — ABNORMAL LOW (ref 4.0–10.5)

## 2014-04-12 LAB — BASIC METABOLIC PANEL
ANION GAP: 9 (ref 5–15)
BUN: 7 mg/dL (ref 6–23)
CHLORIDE: 102 meq/L (ref 96–112)
CO2: 23 mmol/L (ref 19–32)
Calcium: 8.5 mg/dL (ref 8.4–10.5)
Creatinine, Ser: 1.22 mg/dL — ABNORMAL HIGH (ref 0.50–1.10)
GFR calc Af Amer: 59 mL/min — ABNORMAL LOW (ref >90)
GFR, EST NON AFRICAN AMERICAN: 51 mL/min — AB (ref >90)
Glucose, Bld: 124 mg/dL — ABNORMAL HIGH (ref 70–99)
Potassium: 3.5 mmol/L (ref 3.5–5.1)
Sodium: 134 mmol/L — ABNORMAL LOW (ref 135–145)

## 2014-04-12 LAB — I-STAT TROPONIN, ED: Troponin i, poc: 0 ng/mL (ref 0.00–0.08)

## 2014-04-12 LAB — D-DIMER, QUANTITATIVE (NOT AT ARMC): D DIMER QUANT: 1.62 ug{FEU}/mL — AB (ref 0.00–0.48)

## 2014-04-12 LAB — BRAIN NATRIURETIC PEPTIDE: B NATRIURETIC PEPTIDE 5: 165.5 pg/mL — AB (ref 0.0–100.0)

## 2014-04-12 MED ORDER — AZITHROMYCIN 250 MG PO TABS: ORAL_TABLET | ORAL | Status: DC

## 2014-04-12 MED ORDER — GI COCKTAIL ~~LOC~~
30.0000 mL | Freq: Once | ORAL | Status: AC
  Administered 2014-04-12: 30 mL via ORAL
  Filled 2014-04-12: qty 30

## 2014-04-12 MED ORDER — IOHEXOL 350 MG/ML SOLN
100.0000 mL | Freq: Once | INTRAVENOUS | Status: AC | PRN
  Administered 2014-04-12: 100 mL via INTRAVENOUS

## 2014-04-12 MED ORDER — PREDNISONE 20 MG PO TABS
60.0000 mg | ORAL_TABLET | Freq: Once | ORAL | Status: AC
  Administered 2014-04-12: 60 mg via ORAL
  Filled 2014-04-12: qty 3

## 2014-04-12 MED ORDER — MORPHINE SULFATE 4 MG/ML IJ SOLN
4.0000 mg | Freq: Once | INTRAMUSCULAR | Status: AC
  Administered 2014-04-12: 4 mg via INTRAVENOUS
  Filled 2014-04-12: qty 1

## 2014-04-12 MED ORDER — PREDNISONE 20 MG PO TABS
ORAL_TABLET | ORAL | Status: DC
Start: 2014-04-12 — End: 2014-04-25

## 2014-04-12 MED ORDER — AMOXICILLIN 500 MG PO TABS: 1000.0000 mg | ORAL_TABLET | Freq: Three times a day (TID) | ORAL | Status: DC

## 2014-04-12 MED ORDER — SODIUM CHLORIDE 0.9 % IV BOLUS (SEPSIS)
500.0000 mL | Freq: Once | INTRAVENOUS | Status: AC
  Administered 2014-04-12: 500 mL via INTRAVENOUS

## 2014-04-12 MED ORDER — ALBUTEROL SULFATE HFA 108 (90 BASE) MCG/ACT IN AERS: 2.0000 | INHALATION_SPRAY | RESPIRATORY_TRACT | Status: DC | PRN

## 2014-04-12 MED ORDER — ONDANSETRON HCL 4 MG/2ML IJ SOLN
4.0000 mg | Freq: Once | INTRAMUSCULAR | Status: AC
  Administered 2014-04-12: 4 mg via INTRAVENOUS
  Filled 2014-04-12: qty 2

## 2014-04-12 MED ORDER — ALBUTEROL SULFATE (2.5 MG/3ML) 0.083% IN NEBU
5.0000 mg | INHALATION_SOLUTION | Freq: Once | RESPIRATORY_TRACT | Status: AC
  Administered 2014-04-12: 5 mg via RESPIRATORY_TRACT
  Filled 2014-04-12: qty 6

## 2014-04-12 NOTE — ED Notes (Signed)
Pt from home for eval of right sided intermittent cp x4 days, also reports constant sob-denies any abnormal swelling to extremities. pt reports productive cough x1 week with yellow flem production. Denies trying to take any additional pain medications.

## 2014-04-12 NOTE — ED Notes (Signed)
Pt in c/o cough and congestion, chest pain, pain when taking a deep breath, shortness of breath for the last few days, no distress noted

## 2014-04-12 NOTE — ED Provider Notes (Signed)
Patient signed out to me by Morgan Pontes, PA-C. Patient is here for evaluation of acute asthma exacerbation and URI like symptoms. Plan is to follow-up on BMP and d-dimer to evaluate for CHF and/or potential for pulmonary embolus. D-dimer was positive, obtained CT angiogram of chest. No evidence of pulmonary embolus, but does confirm right upper lobe pneumonia with small bilateral pleural effusions. BNP not concerning. Discussed will need follow-up chest x-ray in 6 weeks to ensure resolution of pneumonia.  On exam wheezing has resolved. No tachypnea or other evidence of respiratory distress. Oxygen saturation is 100% on room air. Patient states she feels much better and is ready to go home. Patient stable, in good condition and ambulates out of ED without difficulty. Discharged with albuterol inhaler, amoxicillin, azithromycin and prednisone. Instructions to follow-up with primary care for further evaluation and management of her symptoms. Return to ED for new or worsening symptoms. Patient amenable to plan  Meds given in ED:  Medications  sodium chloride 0.9 % bolus 500 mL (0 mLs Intravenous Stopped 04/12/14 1644)  predniSONE (DELTASONE) tablet 60 mg (60 mg Oral Given 04/12/14 1549)  albuterol (PROVENTIL) (2.5 MG/3ML) 0.083% nebulizer solution 5 mg (5 mg Nebulization Given 04/12/14 1549)  morphine 4 MG/ML injection 4 mg (4 mg Intravenous Given 04/12/14 1549)  ondansetron (ZOFRAN) injection 4 mg (4 mg Intravenous Given 04/12/14 1549)  gi cocktail (Maalox,Lidocaine,Donnatal) (30 mLs Oral Given 04/12/14 1549)  iohexol (OMNIPAQUE) 350 MG/ML injection 100 mL (100 mLs Intravenous Contrast Given 04/12/14 1824)    Discharge Medication List as of 04/12/2014  8:08 PM    START taking these medications   Details  albuterol (PROVENTIL HFA;VENTOLIN HFA) 108 (90 BASE) MCG/ACT inhaler Inhale 2 puffs into the lungs every 2 (two) hours as needed for wheezing or shortness of breath (cough)., Starting  04/12/2014, Until Discontinued, Print    amoxicillin (AMOXIL) 500 MG tablet Take 2 tablets (1,000 mg total) by mouth 3 (three) times daily. X 5 days, Starting 04/12/2014, Until Discontinued, Print    azithromycin (ZITHROMAX Z-PAK) 250 MG tablet 2 po day one, then 1 daily x 4 days, Print    !! predniSONE (DELTASONE) 20 MG tablet 3 tabs po daily x 3 days, Print     !! - Potential duplicate medications found. Please discuss with provider.      Filed Vitals:   04/12/14 1934 04/12/14 1935 04/12/14 1957 04/12/14 2015  BP: 99/67  98/58 106/71  Pulse:   71 69  Temp:      TempSrc:      Resp:   16   SpO2:  99% 100% 99%   temperature on arrival 98.68F   Verl Dicker, PA-C 04/12/14 [place], MD 04/13/14 5035

## 2014-04-12 NOTE — Discharge Instructions (Signed)
Use prednisone as directed daily with breakfast. Use inhaler as directed as needed for shortness of breath or wheezing. Take antibiotics until completed. See your regular doctor in 1 week for recheck, and see your cardiologist for ongoing care. Take tylenol or ibuprofen for pain. Stay very well hydrated. Return to the ER for changes or worsening symptoms   Cough, Adult  A cough is a reflex that helps clear your throat and airways. It can help heal the body or may be a reaction to an irritated airway. A cough may only last 2 or 3 weeks (acute) or may last more than 8 weeks (chronic).  CAUSES Acute cough:  Viral or bacterial infections. Chronic cough:  Infections.  Allergies.  Asthma.  Post-nasal drip.  Smoking.  Heartburn or acid reflux.  Some medicines.  Chronic lung problems (COPD).  Cancer. SYMPTOMS   Cough.  Fever.  Chest pain.  Increased breathing rate.  High-pitched whistling sound when breathing (wheezing).  Colored mucus that you cough up (sputum). TREATMENT   A bacterial cough may be treated with antibiotic medicine.  A viral cough must run its course and will not respond to antibiotics.  Your caregiver may recommend other treatments if you have a chronic cough. HOME CARE INSTRUCTIONS   Only take over-the-counter or prescription medicines for pain, discomfort, or fever as directed by your caregiver. Use cough suppressants only as directed by your caregiver.  Use a cold steam vaporizer or humidifier in your bedroom or home to help loosen secretions.  Sleep in a semi-upright position if your cough is worse at night.  Rest as needed.  Stop smoking if you smoke. SEEK IMMEDIATE MEDICAL CARE IF:   You have pus in your sputum.  Your cough starts to worsen.  You cannot control your cough with suppressants and are losing sleep.  You begin coughing up blood.  You have difficulty breathing.  You develop pain which is getting worse or is uncontrolled  with medicine.  You have a fever. MAKE SURE YOU:   Understand these instructions.  Will watch your condition.  Will get help right away if you are not doing well or get worse. Document Released: 09/05/2010 Document Revised: 06/01/2011 Document Reviewed: 09/05/2010 [facility] Patient Information 2015 [place], [place]. This information is not intended to replace advice given to you by your health care provider. Make sure you discuss any questions you have with your health care provider.  Chest Pain (Nonspecific) It is often hard to give a diagnosis for the cause of chest pain. There is always a chance that your pain could be related to something serious, such as a heart attack or a blood clot in the lungs. You need to follow up with your doctor. HOME CARE  If antibiotic medicine was given, take it as directed by your doctor. Finish the medicine even if you start to feel better.  For the next few days, avoid activities that bring on chest pain. Continue physical activities as told by your doctor.  Do not use any tobacco products. This includes cigarettes, chewing tobacco, and e-cigarettes.  Avoid drinking alcohol.  Only take medicine as told by your doctor.  Follow your doctor's suggestions for more testing if your chest pain does not go away.  Keep all doctor visits you made. GET HELP IF:  Your chest pain does not go away, even after treatment.  You have a rash with blisters on your chest.  You have a fever. GET HELP RIGHT AWAY IF:   You  have more pain or pain that spreads to your arm, neck, jaw, back, or belly (abdomen).  You have shortness of breath.  You cough more than usual or cough up blood.  You have very bad back or belly pain.  You feel sick to your stomach (nauseous) or throw up (vomit).  You have very bad weakness.  You pass out (faint).  You have chills. This is an emergency. Do not wait to see if the problems will go away. Call your local emergency  services (911 in [place] Do not drive yourself to the hospital. MAKE SURE YOU:   Understand these instructions.  Will watch your condition.  Will get help right away if you are not doing well or get worse. Document Released: 08/26/2007 Document Revised: 03/14/2013 Document Reviewed: 08/26/2007 [facility] Patient Information 2015 [place], [place]. This information is not intended to replace advice given to you by your health care provider. Make sure you discuss any questions you have with your health care provider.  Pneumonia Pneumonia is an infection of the lungs.  CAUSES Pneumonia may be caused by bacteria or a virus. Usually, these infections are caused by breathing infectious particles into the lungs (respiratory tract). SIGNS AND SYMPTOMS   Cough.  Fever.  Chest pain.  Increased rate of breathing.  Wheezing.  Mucus production. DIAGNOSIS  If you have the common symptoms of pneumonia, your health care provider will typically confirm the diagnosis with a chest X-ray. The X-ray will show an abnormality in the lung (pulmonary infiltrate) if you have pneumonia. Other tests of your blood, urine, or sputum may be done to find the specific cause of your pneumonia. Your health care provider may also do tests (blood gases or pulse oximetry) to see how well your lungs are working. TREATMENT  Some forms of pneumonia may be spread to other people when you cough or sneeze. You may be asked to wear a mask before and during your exam. Pneumonia that is caused by bacteria is treated with antibiotic medicine. Pneumonia that is caused by the influenza virus may be treated with an antiviral medicine. Most other viral infections must run their course. These infections will not respond to antibiotics.  HOME CARE INSTRUCTIONS   Cough suppressants may be used if you are losing too much rest. However, coughing protects you by clearing your lungs. You should avoid using cough suppressants if you can.  Your  health care provider may have prescribed medicine if he or she thinks your pneumonia is caused by bacteria or influenza. Finish your medicine even if you start to feel better.  Your health care provider may also prescribe an expectorant. This loosens the mucus to be coughed up.  Take medicines only as directed by your health care provider.  Do not smoke. Smoking is a common cause of bronchitis and can contribute to pneumonia. If you are a smoker and continue to smoke, your cough may last several weeks after your pneumonia has cleared.  A cold steam vaporizer or humidifier in your room or home may help loosen mucus.  Coughing is often worse at night. Sleeping in a semi-upright position in a recliner or using a couple pillows under your head will help with this.  Get rest as you feel it is needed. Your body will usually let you know when you need to rest. PREVENTION A pneumococcal shot (vaccine) is available to prevent a common bacterial cause of pneumonia. This is usually suggested for:  People over 57 years old.  Patients  on chemotherapy.  People with chronic lung problems, such as bronchitis or emphysema.  People with immune system problems. If you are over 65 or have a high risk condition, you may receive the pneumococcal vaccine if you have not received it before. In some countries, a routine influenza vaccine is also recommended. This vaccine can help prevent some cases of pneumonia.You may be offered the influenza vaccine as part of your care. If you smoke, it is time to quit. You may receive instructions on how to stop smoking. Your health care provider can provide medicines and counseling to help you quit. SEEK MEDICAL CARE IF: You have a fever. SEEK IMMEDIATE MEDICAL CARE IF:   Your illness becomes worse. This is especially true if you are elderly or weakened from any other disease.  You cannot control your cough with suppressants and are losing sleep.  You begin coughing up  blood.  You develop pain which is getting worse or is uncontrolled with medicines.  Any of the symptoms which initially brought you in for treatment are getting worse rather than better.  You develop shortness of breath or chest pain. MAKE SURE YOU:   Understand these instructions.  Will watch your condition.  Will get help right away if you are not doing well or get worse. Document Released: 03/09/2005 Document Revised: 07/24/2013 Document Reviewed: 05/29/2010 [facility] Patient Information 2015 [place], [place]. This information is not intended to replace advice given to you by your health care provider. Make sure you discuss any questions you have with your health care provider.

## 2014-04-12 NOTE — ED Provider Notes (Signed)
CSN: [id number]     Arrival date & time 04/12/14  1352 History   First MD Initiated Contact with Patient 04/12/14 1431     Chief Complaint  Patient presents with  . Chest Pain  . Cough     (Consider location/radiation/quality/duration/timing/severity/associated sxs/prior Treatment) HPI Comments: Morgan Bean is a 49 y.o. 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 She reports that her chest pain is located in the right anterior aspect of her chest, 8/10, constant 1 week, squeezing in nature, radiating to her back, worse with coughing and deep inspiration, and with no known alleviating factors that she has not tried anything prior to arrival. She has not tried any medications for her symptoms, and states she ran out of her inhaler and has not been able to attempt using this. She denies any known fevers, rhinorrhea, sore throat, ear symptoms, jaw or neck pain, lower extremity swelling, hemoptysis, recent travel surgery or immobilization, sick contacts, claudication, PND, orthopnea, weight gain, abdominal pain, hematemesis, melena, hematochezia, dysuria, hematuria, vaginal bleeding or discharge, numbness, tingling, weakness, alcohol use, IVDU, or estrogen use. She has been seen by cardiology in the past, last saw cardiologist one year ago. Prior smoker (65yrs of 1/4ppd).  Chart review reveals: Last Echo in 06/2012 moderate LVH, EF 65%, normal wall motion, diastolic dysfunction, mild  AI, mild MR. Lexiscan myoview in 12/2010 with significant ST depression upon Lexiscan injection but no ischemia or infarction on perfusion images. Left heart cath in 12/2010 showed 30% mid LAD, 30% ostial D1, 50% ostial D2.   Patient is a 49 y.o. female presenting with chest pain and cough. The history is provided by the patient. No language interpreter was used.  Chest Pain Pain location:  R chest Pain quality comment:  Squeezing Pain radiates to:  Upper back Pain radiates to the back: yes   Pain severity:  Moderate (8/10) Onset quality:  Gradual Duration:  1 week Timing:  Constant Progression:  Unchanged Chronicity:  New Context: breathing   Context comment:  Coughing and inspiration Relieved by:  None tried Worsened by:  Coughing and deep breathing Ineffective treatments:  None tried Associated symptoms: cough, nausea, shortness of breath and vomiting (NBNB x1 episode )   Associated symptoms: no abdominal pain, no altered mental status, no anxiety, no back pain, no claudication, no diaphoresis, no dizziness, no dysphagia, no fever, no headache, no heartburn, no lower extremity edema, no near-syncope, no numbness, no orthopnea, no palpitations, no PND, no syncope and no weakness   Risk factors: coronary artery disease, high cholesterol and hypertension   Risk factors: no immobilization, no prior DVT/PE, no smoking and no surgery   Cough Cough characteristics:  Productive Sputum characteristics:  Yellow Severity:  Moderate Onset quality:  Gradual Duration:  1 week Timing:  Constant Progression:  Unchanged Chronicity:  New Smoker: no   Relieved by:  None tried Worsened by:  Activity Ineffective treatments:  None tried Associated symptoms: chest pain, chills, shortness of breath and wheezing   Associated symptoms: no diaphoresis, no ear fullness, no ear pain,  no eye discharge, no fever, no headaches, no myalgias, no rash, no rhinorrhea, no sinus congestion and no sore throat    Risk factors: no recent infection and no recent travel     Past Medical History  Diagnosis Date  . Coronary artery disease     Lexiscan myoview (10/12) with significant ST depression upon Lexiscan injection but no ischemia or infarction on perfusion images. Left heart cath (10/12): 30% mid LAD, 30% ostial D1, 50% ostial D2.    [name] Hypertension   . Tricuspid regurgitation   . Iron deficiency     hx of  . Polysubstance abuse     Prior cocaine  . Tobacco abuse   . Leukocytoclastic vasculitis     Rash across lower body, occurred in 9/11, diagnosed by skin biopsy. ANA positive. Thought to be secondary to cocaine use.  . Pulmonary HTN     Echo (9/11) with EF 65%, mild LVH, mild AI, mild MR, moderate to possibly severe TR with PA systolic pressure 52 mmHg. Echo (10/12): severe LV hypertrophy, EF 60-65%, mild MR, mild AI, moderate to severe tricuspid regurgitation, PA systolic pressure 38 mmHg.  Echo 4/14: moderate LVH, EF 65%, normal wall motion, diastolic dysfunction, mild AI, mild MR  . Asthma   . Shortness of breath     a. PFTs 5/14:  FEV1/FVC 99% predicted; FVC 60% predicted; DLCO mildly reduced, mild restriction, air trapping.;  b.  seen by pulmo (Dr. [name]) 5/14: mainly upper airway symptoms - ACE d/c'd (sx's better)  . Chronic diastolic CHF (congestive heart failure)   . Constipation   . Lymphadenopathy 03/21/2013  . Leukopenia 03/21/2013  . Unspecified deficiency anemia 03/29/2013  . Anginal pain     none in past year  . Anxiety   . Mental disorder   . Depression   . Stroke 2010ish  . Arthritis   . Pancytopenia   . Hyperlipemia   . Syphilis 04/25/2013  . Candidiasis 06/23/2013  . Anemia of other chronic disease 11/13/2013  . Cough 11/13/2013  . Lymphadenopathy 01/30/2014  . Headache 02/22/2014   Past Surgical History  Procedure Laterality Date  . Ankle surgery      right  . Cardiac catheterization    . I&d extremity  08/09/2011    Procedure: IRRIGATION AND DEBRIDEMENT  EXTREMITY;  Surgeon: [name], MD;  Location: [facility];  Service: General;  Laterality: Left;  i & D Left axilla abscess  . Lymph node biopsy N/A 04/05/2013    Procedure: EXCISIONAL BIOPSY RIGHT SUBMANDIBULAR NODE, NASAL ENDOSCOPIC WITH BIOPSY NASAL PHARYNX;  Surgeon: [name], MD;  Location: [facility];  Service: ENT;  Laterality: N/A;   Family History  Problem Relation Age of Onset  . Coronary artery disease Mother   . Diabetes Mother   . Breast cancer Maternal Grandmother   . Diabetes Maternal Grandmother   . Diabetes Father   . Hypertension Father   . Coronary artery disease Father    History  Substance Use Topics  . Smoking status: Former Smoker -- 0.25 packs/day for 30 years    Types: Cigarettes    Quit date: 05/23/2013  . Smokeless tobacco: Never Used  . Alcohol Use: No     Comment: pint of wine 2-3 times a week--ocassional   OB History    Gravida Para Term Preterm AB TAB SAB Ectopic Multiple Living   4 2 2  2 2    2      Review of Systems  Constitutional: Positive for chills. Negative for fever and diaphoresis.  HENT: Negative for ear pain, rhinorrhea, sinus pressure, sore throat and trouble swallowing.   Eyes: Negative for pain, discharge and itching.  Respiratory: Positive for cough, shortness of breath and wheezing.   Cardiovascular: Positive for chest pain. Negative for palpitations, orthopnea, claudication, leg swelling, syncope, PND and near-syncope.  Gastrointestinal: Positive for nausea, vomiting (NBNB x1 episode ) and diarrhea (2 episodes loose stool, NBNB). Negative for heartburn, abdominal pain and constipation.  Genitourinary: Negative for dysuria, hematuria, vaginal bleeding and vaginal discharge.  Musculoskeletal: Negative for myalgias, back pain and arthralgias.  Skin: Negative for rash.  Allergic/Immunologic: Negative for immunocompromised state.  Neurological: Negative for dizziness, weakness, light-headedness, numbness and headaches.   10 Systems  reviewed and are negative for acute change except as noted in the HPI.    Allergies  Ciprofloxacin  Home Medications   Prior to Admission medications   Medication Sig Start Date End Date Taking? Authorizing Provider  albuterol (PROVENTIL) (2.5 MG/3ML) 0.083% nebulizer solution Take 2.5 mg by nebulization every 4 (four) hours as needed for wheezing or shortness of breath.    Historical Provider, MD  amLODipine (NORVASC) 10 MG tablet TAKE ONE TABLET DAILY 09/11/13   [name], MD  atorvastatin (LIPITOR) 80 MG tablet Take 1 tablet (80 mg total) by mouth daily. 11/14/13   [name], MD  buPROPion (WELLBUTRIN) 100 MG tablet Take 100 mg by mouth 2 (two) times daily. 03/22/13   Historical Provider, MD  butalbital-acetaminophen-caffeine (FIORICET) 50-325-40 MG per tablet Take 1 tablet by mouth every 6 (six) hours as needed for headache. 02/22/14 02/22/15  Heath Lark, MD  citalopram (CELEXA) 40 MG tablet Take 1 tablet (40 mg total) by mouth daily. 08/25/13   Tresa Garter, MD  cloNIDine (CATAPRES) 0.3 MG tablet TAKE 1 TABLET BY MOUTH TWICE DAILY 03/29/14   [name], MD  clopidogrel (PLAVIX) 75 MG tablet Take 1 tablet (75 mg total) by mouth daily. 10/20/13   Cameron Sprang, MD  ferrous sulfate 325 (65 FE) MG tablet Take 650 mg by mouth daily with breakfast.    Historical Provider, MD  furosemide (LASIX) 40 MG tablet TAKE 1/2 TABLET BY MOUTH TWICE DAILY 02/02/14   [name], MD  hydrOXYzine (ATARAX/VISTARIL) 25 MG tablet Take 25 mg by mouth at bedtime as needed (sleep).    Historical Provider, MD  loratadine (CLARITIN) 10 MG tablet Take 1 tablet (10 mg total) by mouth daily. 09/20/13   Lance Bosch, NP  Multiple Vitamin (MULTIVITAMIN WITH MINERALS) TABS Take 1 tablet by mouth daily. 11/18/11   Milana Huntsman Readling, MD  omeprazole (PRILOSEC) 40 MG capsule Take 40 mg by mouth daily.    Historical Provider, MD  polyethylene glycol powder (GLYCOLAX/MIRALAX) powder Take 17 g by mouth daily  as needed (constipation).     Historical Provider, MD  potassium chloride (K-DUR,KLOR-CON) 10 MEQ tablet Take 1 tablet (10 mEq total) by mouth 2 (two) times daily with a meal. 10/06/13   [name], MD  predniSONE (DELTASONE) 5 MG tablet Take 1 tablet (5 mg total) by mouth daily with breakfast. 01/09/14   Heath Lark, MD  traZODone (DESYREL) 100 MG tablet Take 1 tablet (100 mg total) by mouth at bedtime. 08/25/13   Tresa Garter, MD   BP 109/70 mmHg  Pulse 88  Temp(Src) 98.5 F (36.9 C) (Oral)  Resp 20  SpO2 100%  LMP 04/24/2013 Physical Exam  Constitutional:  She is oriented to person, place, and time. Vital signs are normal. She appears well-developed and well-nourished.  Non-toxic appearance. No distress.  Afebrile, nontoxic, NAD, VSS  HENT:  Head: Normocephalic and atraumatic.  Mouth/Throat: Oropharynx is clear and moist. Mucous membranes are dry.  Very dry mucous membranes  Eyes: Conjunctivae and EOM are normal. Right eye exhibits no discharge. Left eye exhibits no discharge.  Neck: Normal range of motion. Neck supple. No JVD present.  No JVD  Cardiovascular: Normal rate, regular rhythm, normal heart sounds and intact distal pulses.  Exam reveals no gallop and no friction rub.   No murmur heard. RRR, nl s1/s2, no m/r/g, distal pulses intact, no pedal edema   Pulmonary/Chest: Effort normal. No respiratory distress. She has no decreased breath sounds. She has wheezes. She has no rhonchi. She has no rales. She exhibits no tenderness, no crepitus, no deformity and no retraction.  Mild expiratory wheezing diffusely, most prominent in RUL. Chest wall nonTTP without crepitus or deformity SpO2 100% on RA, no hypoxia or increased WOB, speaking in full sentences  Abdominal: Soft. Normal appearance and bowel sounds are normal. She exhibits no distension. There is no tenderness. There is no rigidity, no rebound, no guarding, no CVA tenderness, no tenderness at McBurney's point and negative  Murphy's sign.  Soft, NTND, +BS throughout, no r/g/r, neg murphy's, neg mcburney's, no CVA TTP   Musculoskeletal: Normal range of motion.  MAE x4 No pedal edema Strength and sensation intact  Neurological: She is alert and oriented to person, place, and time. She has normal strength. No sensory deficit.  Skin: Skin is warm, dry and intact. No rash noted.  Psychiatric: She has a normal mood and affect.  Nursing note and vitals reviewed.   ED Course  Procedures (including critical care time) Labs Review Labs Reviewed  CBC - Abnormal; Notable for the following:    WBC 2.2 (*)    Hemoglobin 10.0 (*)    HCT 30.9 (*)    MCV 76.1 (*)    MCH 24.6 (*)    All other components within normal limits  BASIC METABOLIC PANEL - Abnormal; Notable for the following:    Sodium 134 (*)    Glucose, Bld 124 (*)    Creatinine, Ser 1.22 (*)    GFR calc non Af Amer 51 (*)    GFR calc Af Amer 59 (*)    All other components within normal limits  BRAIN NATRIURETIC PEPTIDE  D-DIMER, QUANTITATIVE  I-STAT TROPOININ, ED    Imaging Review Dg Chest 2 View  04/12/2014   CLINICAL DATA:  Productive cough for 3 days.  Right-sided chest pain  EXAM: CHEST  2 VIEW  COMPARISON:  Chest radiograph November 13, 2013; chest CT February 02, 2014  FINDINGS: There is focal airspace consolidation in the posterior segment right upper lobe. There is patchy atelectasis in the periphery of the right middle lobe. Elsewhere lungs are clear. Heart size and pulmonary vascularity are normal. No adenopathy. No bone lesions.  IMPRESSION: Airspace consolidation in posterior segment right upper lobe. Patchy atelectasis right middle lobe. Lungs elsewhere clear.   Electronically Signed   By: Lowella Grip M.D.   On: 04/12/2014 14:51     EKG Interpretation   Date/Time:  Thursday April 12 2014 14:00:06 EST Ventricular Rate:  85 PR Interval:  148 QRS Duration: 78 QT Interval:  350 QTC Calculation: 416 R Axis:   47 Text  Interpretation:  Sinus rhythm with marked sinus arrhythmia  Nonspecific  T wave abnormality Abnormal ECG When compared with ECG of  10/18/2007 T wave abnormality is less pronounced Confirmed by [facility]  MD,  Nunzio Cory [phone]) on 04/12/2014 2:33:51 PM      MDM   Final diagnoses:  Chest pain, unspecified chest pain type  SOB (shortness of breath)  Right middle lobe pneumonia  Pancytopenia  AKI (acute kidney injury)    49 y.o. female with cough, CP, SOB x1 wk. Distal pulses intact in all extremities, cardiac exam unremarkable, but pt with significant cardiac history. Mild expiratory wheeze most prominent in RUF, will give prednisone and albuterol given hx of asthma with productive cough x1wk. PERC positive due to age, but Well's criteria low risk for PE therefore will obtain D-dimer. Will get BNP given hx of CHF and CP/SOB. No periph edema or JVD. Will give morphine but will hold on ASA since pt was told not to take NSAIDs, and hold on NTG since her BP is 109/70. CBC with baseline pancytopenia unchanged from prior, BMP showing Cr 1.22 which is elevated from baseline, will give small bolus of fluids. Trop neg. EKG unchanged from prior. VSS, NAD at this time. Will reassess shortly.    4:34 PM Significant improvement with respect to wheezing after nebs. CP has improved with morphine and GI cocktail, VS remain stable with BPs 102/71 and SpO2 100% on RA. Pt tolerating PO well. Still appears dry, but given hx of CHF do not want to overload intravascularly, therefore encouraged pt to continue PO hydration. Awaiting Ddimer and BNP, called lab to find out why they've been collected but are not yet in process. At this time, will sign out care to [facility], PA-C to follow up on labs. If BNP and dimer neg, pt safe for discharge if CP is still resolved as it is at this time. If dimer positive, will proceed with CTA. If CP persists, pt could be admitted for cardiac eval, although she has good outpt follow up with  cardiology. Will write scripts for Azithromycine with Amoxicillin for her possible PNA, as well as pred burst and inhaler, but Please see Rosanna Randy dictation for further documentation of care.   BP 102/71 mmHg  Pulse 74  Temp(Src) 98.5 F (36.9 C) (Oral)  Resp 17  SpO2 100%  LMP 04/24/2013  Meds ordered this encounter  Medications  . sodium chloride 0.9 % bolus 500 mL    Sig:   . predniSONE (DELTASONE) tablet 60 mg    Sig:   . albuterol (PROVENTIL) (2.5 MG/3ML) 0.083% nebulizer solution 5 mg    Sig:   . morphine 4 MG/ML injection 4 mg    Sig:   . ondansetron (ZOFRAN) injection 4 mg    Sig:   . gi cocktail (Maalox,Lidocaine,Donnatal)    Sig:   . azithromycin (ZITHROMAX Z-PAK) 250 MG tablet    Sig: 2 po day one, then 1 daily x 4 days    Dispense:  5 tablet    Refill:  0    Order Specific Question:  Supervising Provider    Answer:  Noemi Chapel D [[id number]]  . amoxicillin (AMOXIL) 500 MG tablet    Sig: Take 2 tablets (1,000 mg total) by mouth 3 (three) times daily. X 5 days    Dispense:  30 tablet    Refill:  0    Order Specific Question:  Supervising Provider    Answer:  Noemi Chapel D [[id number]]  . predniSONE (DELTASONE) 20 MG tablet    Sig: 3  tabs po daily x 3 days    Dispense:  9 tablet    Refill:  0    Order Specific Question:  Supervising Provider    Answer:  Noemi Chapel D [[id number]]  . albuterol (PROVENTIL HFA;VENTOLIN HFA) 108 (90 BASE) MCG/ACT inhaler    Sig: Inhale 2 puffs into the lungs every 2 (two) hours as needed for wheezing or shortness of breath (cough).    Dispense:  1 Inhaler    Refill:  0    Order Specific Question:  Supervising Provider    Answer:  Johnna Acosta [address]     [place], PA-C 04/12/14 [place], DO 04/15/14 1447

## 2014-04-12 NOTE — ED Notes (Signed)
PA at bedside.

## 2014-04-20 ENCOUNTER — Ambulatory Visit: Payer: Self-pay | Admitting: Hematology and Oncology

## 2014-04-20 ENCOUNTER — Other Ambulatory Visit: Payer: Self-pay

## 2014-04-20 ENCOUNTER — Telehealth: Payer: Self-pay | Admitting: *Deleted

## 2014-04-20 ENCOUNTER — Telehealth: Payer: Self-pay | Admitting: Hematology and Oncology

## 2014-04-20 NOTE — Telephone Encounter (Signed)
s.w. pt and advised on Feb appt....ok and aware °

## 2014-04-20 NOTE — Telephone Encounter (Signed)
Pt did not show up for her appt this morning.  Called pt and left VM to check on her.  Request sent to scheduler to r/s pt to next available on 2/29.  Asked pt to call back if she has any questions.

## 2014-04-23 ENCOUNTER — Encounter: Payer: Self-pay | Admitting: Internal Medicine

## 2014-04-23 ENCOUNTER — Ambulatory Visit: Payer: Self-pay | Attending: Internal Medicine | Admitting: Internal Medicine

## 2014-04-23 VITALS — BP 122/85 | HR 93 | Temp 97.6°F | Ht 66.0 in | Wt 198.9 lb

## 2014-04-23 DIAGNOSIS — I10 Essential (primary) hypertension: Secondary | ICD-10-CM

## 2014-04-23 DIAGNOSIS — N179 Acute kidney failure, unspecified: Secondary | ICD-10-CM

## 2014-04-23 DIAGNOSIS — R319 Hematuria, unspecified: Secondary | ICD-10-CM

## 2014-04-23 LAB — BASIC METABOLIC PANEL
BUN: 23 mg/dL (ref 6–23)
CALCIUM: 8.6 mg/dL (ref 8.4–10.5)
CHLORIDE: 104 meq/L (ref 96–112)
CO2: 24 mEq/L (ref 19–32)
CREATININE: 1.01 mg/dL (ref 0.50–1.10)
Glucose, Bld: 88 mg/dL (ref 70–99)
Potassium: 4.4 mEq/L (ref 3.5–5.3)
Sodium: 135 mEq/L (ref 135–145)

## 2014-04-23 LAB — POCT URINALYSIS DIPSTICK
Bilirubin, UA: NEGATIVE
Glucose, UA: NEGATIVE
Ketones, UA: NEGATIVE
Leukocytes, UA: NEGATIVE
Nitrite, UA: NEGATIVE
PH UA: 5.5
PROTEIN UA: NEGATIVE
Spec Grav, UA: 1.025
Urobilinogen, UA: 0.2

## 2014-04-23 MED ORDER — ALBUTEROL SULFATE HFA 108 (90 BASE) MCG/ACT IN AERS: 2.0000 | INHALATION_SPRAY | RESPIRATORY_TRACT | Status: DC | PRN

## 2014-04-23 NOTE — Progress Notes (Signed)
Patient is here for a ED follow up, and repeat UA.

## 2014-04-23 NOTE — Progress Notes (Signed)
Patient ID: Leiloni Smithers, female   DOB: Aug 19, 1965, 49 y.o.   MRN: [id number]  CC: ED f/u  HPI: Lasonja Lakins is a 49 y.o. 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 She was at that time she was discharged with antibiotics for a URI.  She is due to f/u with cardiology on Wednesday of this week.     Patient has No headache, No abdominal pain - No Nausea, No new weakness tingling or numbness, No Cough - SOB.  Allergies  Allergen Reactions  . Ciprofloxacin Itching   Past Medical History  Diagnosis Date  . Coronary artery disease     Lexiscan myoview (10/12) with significant ST depression upon Lexiscan injection but no ischemia or infarction on perfusion images. Left heart cath (10/12): 30% mid LAD, 30% ostial D1, 50% ostial D2.    [name] Hypertension   . Tricuspid regurgitation   . Iron deficiency     hx of  . Polysubstance abuse     Prior cocaine  . Tobacco abuse   . Leukocytoclastic vasculitis     Rash across lower body, occurred in 9/11, diagnosed by skin biopsy. ANA positive. Thought to be secondary to cocaine use.  . Pulmonary HTN     Echo (9/11) with EF 65%, mild LVH, mild AI, mild MR, moderate to possibly severe TR with PA systolic pressure 52 mmHg. Echo (10/12): severe LV hypertrophy, EF 60-65%, mild MR, mild AI, moderate to severe tricuspid regurgitation, PA systolic pressure 38 mmHg.  Echo 4/14: moderate LVH, EF 65%, normal wall motion, diastolic dysfunction, mild AI, mild MR  . Asthma   . Shortness of breath     a. PFTs 5/14:  FEV1/FVC 99% predicted; FVC 60% predicted; DLCO mildly reduced, mild restriction, air trapping.;  b.  seen by pulmo (Dr. [name]) 5/14: mainly  upper airway symptoms - ACE d/c'd (sx's better)  . Chronic diastolic CHF (congestive heart failure)   . Constipation   . Lymphadenopathy 03/21/2013  . Leukopenia 03/21/2013  . Unspecified deficiency anemia 03/29/2013  . Anginal pain     none in past year  . Anxiety   . Mental disorder   . Depression   . Stroke 2010ish  . Arthritis   . Pancytopenia   . Hyperlipemia   . Syphilis 04/25/2013  . Candidiasis 06/23/2013  . Anemia of other chronic disease 11/13/2013  . Cough 11/13/2013  . Lymphadenopathy 01/30/2014  . Headache 02/22/2014   Current Outpatient Prescriptions on File Prior to Visit  Medication Sig Dispense Refill  . albuterol (PROVENTIL HFA;VENTOLIN HFA) 108 (90 BASE) MCG/ACT inhaler Inhale 2 puffs into the lungs every 2 (two) hours as needed for wheezing or shortness of breath (cough). 1 Inhaler 0  . albuterol (PROVENTIL) (2.5 MG/3ML) 0.083% nebulizer solution Take 2.5 mg by nebulization every 4 (four) hours as needed for wheezing or shortness of breath.    [name] amLODipine (NORVASC) 10 MG tablet TAKE ONE TABLET DAILY 30 tablet 5  . amoxicillin (AMOXIL) 500 MG tablet Take 2 tablets (1,000 mg total) by mouth 3 (three) times daily. X 5 days 30 tablet 0  . atorvastatin (LIPITOR) 80 MG tablet Take 1 tablet (80 mg total) by mouth daily. 30 tablet 3  . buPROPion [facility]  SR) 150 MG 12 hr tablet Take 150 mg by mouth 2 (two) times daily.    . butalbital-acetaminophen-caffeine (FIORICET) 50-325-40 MG per tablet Take 1 tablet by mouth every 6 (six) hours as needed for headache. 30 tablet 0  . citalopram (CELEXA) 40 MG tablet Take 1 tablet (40 mg total) by mouth daily. 30 tablet 1  . cloNIDine (CATAPRES) 0.3 MG tablet TAKE 1 TABLET BY MOUTH TWICE DAILY 60 tablet 2  . clopidogrel (PLAVIX) 75 MG tablet Take 1 tablet (75 mg total) by mouth daily. 30 tablet 11  . ferrous sulfate 325 (65 FE) MG tablet Take 650 mg by mouth daily with breakfast.    . furosemide (LASIX) 40 MG tablet TAKE 1/2 TABLET BY  MOUTH TWICE DAILY 30 tablet 1  . hydrOXYzine (ATARAX/VISTARIL) 25 MG tablet Take 25 mg by mouth at bedtime as needed (sleep).    . loratadine (CLARITIN) 10 MG tablet Take 1 tablet (10 mg total) by mouth daily. 30 tablet 3  . Multiple Vitamin (MULTIVITAMIN WITH MINERALS) TABS Take 1 tablet by mouth daily.    [name] omeprazole (PRILOSEC) 40 MG capsule Take 40 mg by mouth daily.    . polyethylene glycol powder (GLYCOLAX/MIRALAX) powder Take 17 g by mouth daily as needed (constipation).     . potassium chloride (K-DUR,KLOR-CON) 10 MEQ tablet Take 1 tablet (10 mEq total) by mouth 2 (two) times daily with a meal. 60 tablet 3  . traZODone (DESYREL) 100 MG tablet Take 1 tablet (100 mg total) by mouth at bedtime. 30 tablet 1  . azithromycin (ZITHROMAX Z-PAK) 250 MG tablet 2 po day one, then 1 daily x 4 days (Patient not taking: Reported on 04/23/2014) 5 tablet 0  . buPROPion (WELLBUTRIN) 100 MG tablet Take 100 mg by mouth 2 (two) times daily.    . predniSONE (DELTASONE) 20 MG tablet 3 tabs po daily x 3 days (Patient not taking: Reported on 04/23/2014) 9 tablet 0  . predniSONE (DELTASONE) 5 MG tablet Take 1 tablet (5 mg total) by mouth daily with breakfast. (Patient not taking: Reported on 04/23/2014) 30 tablet 0   No current facility-administered medications on file prior to visit.   Family History  Problem Relation Age of Onset  . Coronary artery disease Mother   . Diabetes Mother   . Breast cancer Maternal Grandmother   . Diabetes Maternal Grandmother   . Diabetes Father   . Hypertension Father   . Coronary artery disease Father    History   Social History  . Marital Status: Single    Spouse Name: N/A    Number of Children: 2  . Years of Education: N/A   Occupational History  . unemployed    Social History Main Topics  . Smoking status: Former Smoker -- 0.25 packs/day for 30 years    Types: Cigarettes    Quit date: 05/23/2013  . Smokeless tobacco: Never Used  . Alcohol Use: No     Comment:  pint of wine 2-3 times a week--ocassional  . Drug Use: 1.00 per week    Special: "Crack" cocaine     Comment: none since 02/04/13  . Sexual Activity: Yes   Other Topics Concern  . Not on file   Social History Narrative   Lives with mom, sisters and brothers.    Review of Systems: See HPI.    Objective:   Filed Vitals:   04/23/14 1201  BP: 122/85  Pulse: 93  Temp: 97.6 F (36.4 C)  Physical Exam  Constitutional: She is oriented to person, place, and time.  Cardiovascular: Normal rate, regular rhythm and normal heart sounds.   Pulmonary/Chest: Effort normal and breath sounds normal.  Musculoskeletal: She exhibits no tenderness.  Neurological: She is alert and oriented to person, place, and time.  Skin: Skin is warm and dry.     Lab Results  Component Value Date   WBC 2.2* 04/12/2014   HGB 10.0* 04/12/2014   HCT 30.9* 04/12/2014   MCV 76.1* 04/12/2014   PLT 275 04/12/2014   Lab Results  Component Value Date   CREATININE 1.22* 04/12/2014   BUN 7 04/12/2014   NA 134* 04/12/2014   K 3.5 04/12/2014   CL 102 04/12/2014   CO2 23 04/12/2014    Lab Results  Component Value Date   HGBA1C 5.2 10/04/2013   Lipid Panel     Component Value Date/Time   CHOL 149 01/02/2014 0826   TRIG 91.0 01/02/2014 0826   HDL 47.80 01/02/2014 0826   CHOLHDL 3 01/02/2014 0826   VLDL 18.2 01/02/2014 0826   LDLCALC 83 01/02/2014 0826       Assessment and plan:   Lana was seen today for follow-up and hypertension.  Diagnoses and all orders for this visit:  Essential hypertension Patient blood pressure is stable and may continue on current medication.  Education on diet, exercise, and modifiable risk factors discussed. Will obtain appropriate labs as needed. Will follow up in 3-6 months.   AKI (acute kidney injury) Orders: -     Basic Metabolic Panel  Hematuria Orders: -     Urinalysis Dipstick  Other orders -     albuterol (PROVENTIL HFA;VENTOLIN HFA) 108 (90  BASE) MCG/ACT inhaler; Inhale 2 puffs into the lungs every 2 (two) hours as needed for wheezing or shortness of breath (cough).   Return in about 6 months (around 10/22/2014) for Hypertension.      Chari Manning, NP-C [facility] [phone] 04/23/2014, 12:16 PM

## 2014-04-25 ENCOUNTER — Ambulatory Visit (INDEPENDENT_AMBULATORY_CARE_PROVIDER_SITE_OTHER): Payer: Self-pay | Admitting: Physician Assistant

## 2014-04-25 ENCOUNTER — Telehealth: Payer: Self-pay | Admitting: *Deleted

## 2014-04-25 ENCOUNTER — Encounter: Payer: Self-pay | Admitting: Physician Assistant

## 2014-04-25 VITALS — BP 90/70 | HR 71 | Ht 66.0 in | Wt 199.0 lb

## 2014-04-25 DIAGNOSIS — I5032 Chronic diastolic (congestive) heart failure: Secondary | ICD-10-CM

## 2014-04-25 DIAGNOSIS — R079 Chest pain, unspecified: Secondary | ICD-10-CM

## 2014-04-25 DIAGNOSIS — I2510 Atherosclerotic heart disease of native coronary artery without angina pectoris: Secondary | ICD-10-CM

## 2014-04-25 DIAGNOSIS — R0602 Shortness of breath: Secondary | ICD-10-CM

## 2014-04-25 DIAGNOSIS — I071 Rheumatic tricuspid insufficiency: Secondary | ICD-10-CM

## 2014-04-25 DIAGNOSIS — I10 Essential (primary) hypertension: Secondary | ICD-10-CM

## 2014-04-25 LAB — BRAIN NATRIURETIC PEPTIDE: Pro B Natriuretic peptide (BNP): 43 pg/mL (ref 0.0–100.0)

## 2014-04-25 MED ORDER — CLONIDINE HCL 0.2 MG PO TABS: 0.2000 mg | ORAL_TABLET | Freq: Two times a day (BID) | ORAL | Status: DC

## 2014-04-25 NOTE — Patient Instructions (Signed)
Your physician has recommended you make the following change in your medication:  1. DECREASE CLONIDINE TO 0.2 MG 1 TABLET TWICE DAILY; NEW RX SENT IN  MONITOR YOUR BLOOD PRESSURE AND IF BP IS STAYING ABOVE 140/90 THEN RESUME THE CLONIDINE 0.3 MG 1 TAB TWICE DAILY AND CALL [phone] TO LET SCOTT WEAVER, PAC KNOW IF YOU MAKE THIS CHANGE  LAB WORK TODAY; BNP  FOLLOW UP WITH DR. Aundra Dubin IN MAY 2016  Your physician has requested that you have a lexiscan myoview. For further information please visit [email or web address]. Please follow instruction sheet, as given.

## 2014-04-25 NOTE — Telephone Encounter (Signed)
pt notified about normal lab work today with verbal understanding

## 2014-04-25 NOTE — Telephone Encounter (Signed)
Pt aware of results 

## 2014-04-25 NOTE — Progress Notes (Signed)
Cardiology Office Note   Date:  04/25/2014   ID:  Janise Gora, DOB April 26, 1965, MRN [id number]  PCP:  [name], MD  Cardiologist:  Dr. Loralie Champagne     Chief Complaint  Patient presents with  . Chest Pain  . Shortness of Breath     History of Present Illness: Hedwig Mcfall is a 49 y.o. female with a hx of diastolic CHF, tricuspid regurgitation, abnormal echo with mod pulmonary HTN in 11/2009, prior CVA, nonobstructive CAD and cocaine abuse. She has continued to have problems with exertional dyspnea. Last echo showed normal EF, moderate LVH, and only mild TR (was severe in the past).  She has smoked and is thought to have COPD. She saw Dr. [name] and PFTs did not show much airflow obstruction. She had a lot of upper airway symptoms including cough she was switched to an ARB.     Admitted in 05/2013 for neutropenic fever. She's been followed by oncology. She's had extensive workup. 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    Last seen by Dr. Aundra Dubin 07/2013.  She saw neurology in 09/2013 for suspected TIA.  She was seen in the ED 04/12/14.  She presented with productive cough, chest pain and dyspnea. Chest CT confirm pneumonia. Enzymes remained normal. She was discharged on antibiotics and prednisone.   She reports improved symptoms since being treated for pneumonia. However, she continues to struggle with dyspnea as well as occasional chest pain. She describes left-sided chest discomfort. This comes and goes. She admits to symptoms with exertion. Overall, she's had this same chest pain for quite some time. She does note that her dyspnea with exertion seems to be getting worse. She describes NYHA 2b-3 symptoms. She sleeps on 4-5 pillows. She denies LE edema. She denies syncope.   Studies/Reports Reviewed Today:   - Echocardiogram (6/15):  Moderate focal basal septal hypertrophy, mild LVH, EF 60-65%, normal wall motion, grade 2  diastolic dysfunction, mild AI, mild MR, mild LAE, moderate TR, normal RVSP  - Chest CTA (04/12/14):  IMPRESSION:  1. Pneumonia involving the right upper lobe and superior segment of the right lower lobe. Small bilateral pleural effusions. Recommend followup radiography in 4-6 weeks, to document complete resolution following adequate medical therapy.   Past Medical History: 1. Fe-deficiency anemia 2. HTN 3. Asthma 4. Active smoker 5. Prior cocaine 6. 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 ANA positive. Thought to be secondary to cocaine use.  8. Pulmonary hypertension/tricuspid regurgitation: Echo (9/11) with EF 65%, mild LVH, mild AI, mild MR, moderate to possibly severe TR with PA systolic pressure 52 mmHg. Echo (10/12): severe LV hypertrophy, EF 60-65%, mild MR, mild AI, moderate to severe tricuspid regurgitation, PA systolic pressure 38 mmHg. RHC (10/12) with mean RA 4, PA 28/9, mean PCWP 9, CI 3.8. Echo (4/14) with EF 65%, moderate LVH, mild AI, mild MR, mild TR.  9. Chest pain: Lexiscan myoview (10/12) with significant ST depression upon Lexiscan injection but no ischemia or infarction on perfusion images. Left heart cath (10/12): 30% mid LAD, 30% ostial D1, 50% ostial D2.  10. Diastolic CHF: Echo (5/00) with EF 60-65%, moderate LVH, mild AI, mild MR.  11. COPD: At most mild. PFTs in 6/14 with no obstructive by spirometry but mild air-trapping. Mild restriction as well.  12. Pancytopenia: Bone marrow biopsy negative for lymphoma/leukemia. Thought to be related to an agent in  cocaine.  32. H/o syphilis Past Medical History  Diagnosis Date  . Coronary artery disease     Lexiscan myoview (10/12) with significant ST depression upon Lexiscan injection but no ischemia or infarction on perfusion images. Left heart cath (10/12): 30% mid LAD, 30% ostial D1, 50% ostial D2.    [name] Hypertension   . Tricuspid  regurgitation   . Iron deficiency     hx of  . Polysubstance abuse     Prior cocaine  . Tobacco abuse   . Leukocytoclastic vasculitis     Rash across lower body, occurred in 9/11, diagnosed by skin biopsy. ANA positive. Thought to be secondary to cocaine use.  . Pulmonary HTN     Echo (9/11) with EF 65%, mild LVH, mild AI, mild MR, moderate to possibly severe TR with PA systolic pressure 52 mmHg. Echo (10/12): severe LV hypertrophy, EF 60-65%, mild MR, mild AI, moderate to severe tricuspid regurgitation, PA systolic pressure 38 mmHg.  Echo 4/14: moderate LVH, EF 65%, normal wall motion, diastolic dysfunction, mild AI, mild MR  . Asthma   . Shortness of breath     a. PFTs 5/14:  FEV1/FVC 99% predicted; FVC 60% predicted; DLCO mildly reduced, mild restriction, air trapping.;  b.  seen by pulmo (Dr. [name]) 5/14: mainly upper airway symptoms - ACE d/c'd (sx's better)  . Chronic diastolic CHF (congestive heart failure)   . Constipation   . Lymphadenopathy 03/21/2013  . Leukopenia 03/21/2013  . Unspecified deficiency anemia 03/29/2013  . Anginal pain     none in past year  . Anxiety   . Mental disorder   . Depression   . Stroke 2010ish  . Arthritis   . Pancytopenia   . Hyperlipemia   . Syphilis 04/25/2013  . Candidiasis 06/23/2013  . Anemia of other chronic disease 11/13/2013  . Cough 11/13/2013  . Lymphadenopathy 01/30/2014  . Headache 02/22/2014    Past Surgical History  Procedure Laterality Date  . Ankle surgery      right  . Cardiac catheterization    . I&d extremity  08/09/2011    Procedure: IRRIGATION AND DEBRIDEMENT EXTREMITY;  Surgeon: [name], MD;  Location: [facility];  Service: General;  Laterality: Left;  i & D Left axilla abscess  . Lymph node biopsy N/A 04/05/2013    Procedure: EXCISIONAL BIOPSY RIGHT SUBMANDIBULAR NODE, NASAL ENDOSCOPIC WITH BIOPSY NASAL PHARYNX;  Surgeon: [name], MD;  Location: [facility];  Service: ENT;  Laterality: N/A;     Current Outpatient  Prescriptions  Medication Sig Dispense Refill  . albuterol (PROVENTIL HFA;VENTOLIN HFA) 108 (90 BASE) MCG/ACT inhaler Inhale 2 puffs into the lungs every 2 (two) hours as needed for wheezing or shortness of breath (cough). 1 Inhaler 4  . albuterol (PROVENTIL) (2.5 MG/3ML) 0.083% nebulizer solution Take 2.5 mg by nebulization every 4 (four) hours as needed for wheezing or shortness of breath.    [name] amLODipine (NORVASC) 10 MG tablet TAKE ONE TABLET DAILY 30 tablet 5  . amoxicillin (AMOXIL) 500 MG tablet Take 2 tablets (1,000 mg total) by mouth 3 (three) times daily. X 5 days 30 tablet 0  . atorvastatin (LIPITOR) 80 MG tablet Take 1 tablet (80 mg total) by mouth daily. 30 tablet 3  . butalbital-acetaminophen-caffeine (FIORICET) 50-325-40 MG per tablet Take 1 tablet by mouth every 6 (six) hours as needed for headache. 30 tablet 0  . citalopram (CELEXA) 40 MG tablet Take 1 tablet (40 mg total) by mouth daily.  30 tablet 1  . cloNIDine (CATAPRES) 0.3 MG tablet TAKE 1 TABLET BY MOUTH TWICE DAILY 60 tablet 2  . clopidogrel (PLAVIX) 75 MG tablet Take 1 tablet (75 mg total) by mouth daily. 30 tablet 11  . ferrous sulfate 325 (65 FE) MG tablet Take 650 mg by mouth daily with breakfast.    . furosemide (LASIX) 40 MG tablet TAKE 1/2 TABLET BY MOUTH TWICE DAILY 30 tablet 1  . hydrOXYzine (ATARAX/VISTARIL) 25 MG tablet Take 25 mg by mouth at bedtime as needed (sleep).    . loratadine (CLARITIN) 10 MG tablet Take 1 tablet (10 mg total) by mouth daily. 30 tablet 3  . Multiple Vitamin (MULTIVITAMIN WITH MINERALS) TABS Take 1 tablet by mouth daily.    [name] omeprazole (PRILOSEC) 40 MG capsule Take 40 mg by mouth daily.    . polyethylene glycol powder (GLYCOLAX/MIRALAX) powder Take 17 g by mouth daily as needed (constipation).     . potassium chloride (K-DUR,KLOR-CON) 10 MEQ tablet Take 1 tablet (10 mEq total) by mouth 2 (two) times daily with a meal. 60 tablet 3  . traZODone (DESYREL) 100 MG tablet Take 1 tablet (100 mg  total) by mouth at bedtime. 30 tablet 1  . buPROPion (WELLBUTRIN SR) 150 MG 12 hr tablet Take 150 mg by mouth 2 (two) times daily.     No current facility-administered medications for this visit.    Allergies:   Ciprofloxacin    Social History:  The patient  reports that she quit smoking about 11 months ago. Her smoking use included Cigarettes. She has a 7.5 pack-year smoking history. She has never used smokeless tobacco. She reports that she uses illicit drugs ("Crack" cocaine) about once per week. She reports that she does not drink alcohol.   Family History:  The patient's family history includes Breast cancer in her maternal grandmother; Coronary artery disease in her father and mother; Diabetes in her father, maternal grandmother, and mother; Heart attack in her father and maternal grandmother; Hypertension in her father; Stroke in her maternal grandmother.    ROS:  Please see the history of present illness.   Otherwise, review of systems are positive for blurry vision, snoring, constipation, diarrhea, nausea and vomiting, depression anxiety, balance issues (no falls), back pain, easy bruising, headaches.   All other systems are reviewed and negative.    PHYSICAL EXAM: VS:  BP 90/70 mmHg  Pulse 71  Ht _0  (1.676 m)  Wt 199 lb (90.266 kg)  BMI 32.13 kg/m2  LMP 04/24/2013    Wt Readings from Last 3 Encounters:  04/25/14 199 lb (90.266 kg)  04/23/14 198 lb 14.4 oz (90.22 kg)  03/21/14 206 lb 12.8 oz (93.804 kg)     GEN: Well nourished, well developed, in no acute distress HEENT: normal Neck: no JVD, no masses Cardiac:  Normal S1/S2, RRR; no murmur, no rubs or gallops, no edema  Respiratory:  clear to auscultation bilaterally, no wheezing, rhonchi or rales. GI: soft, nontender, nondistended, + BS MS: no deformity or atrophy Skin: warm and dry  Neuro:  CNs II-XII intact, Strength and sensation are intact Psych: Normal affect   EKG:  EKG is ordered today.  It demonstrates:    NSR, HR 71, normal axis, NSSTTW changes, no change from prior tracing.   Recent Labs: 08/06/2013: Pro B Natriuretic peptide (BNP) 1186.0* 11/13/2013: TSH 1.680 03/06/2014: ALT 27 04/12/2014: B Natriuretic Peptide 165.5*; Hemoglobin 10.0*; Platelets 275 04/23/2014: BUN 23; Creatinine 1.01; Potassium 4.4; Sodium 135  Recent Labs  03/06/14 0759 03/06/14 0759 03/21/14 0842 04/12/14 1359 04/23/14 1253  WBC 2.3*  --  2.3* 2.2*  --   HGB 10.1*  --  10.0* 10.0*  --   PLT 207  --  219 275  --   K  --  4.0  --  3.5 4.4  BUN  --  13.3  --  7 23  CREATININE  --  1.1  --  1.22* 1.01     Lipid Panel    Component Value Date/Time   CHOL 149 01/02/2014 0826   TRIG 91.0 01/02/2014 0826   HDL 47.80 01/02/2014 0826   CHOLHDL 3 01/02/2014 0826   VLDL 18.2 01/02/2014 0826   LDLCALC 83 01/02/2014 0826   LDLDIRECT 142.1 12/25/2010 0846      ASSESSMENT AND PLAN:  1.  Shortness of Breath: Overall, she appears to be stable from a volume standpoint. She continues to note significant dyspnea. She had an echocardiogram singly with normal LV function and stable moderate tricuspid regurgitation. She also reports chest pain. It has been several years since being assessed for ischemia.    -  Arrange Lexiscan Myoview.    -  Obtain BNP. 2.  Diastolic CHF:  As noted, volume appears stable. Check BNP.  And tinea current dose of Lasix for now. 3.  Tricuspid Regurgitation:  Moderate by recent echocardiogram. 4.  Pancytopenia:  Follow-up with hematology/oncology. 5.  Hypertension:  Blood pressure tends to run low. Decrease clonidine to 0.2 mg twice a day. She will keep an eye on her blood pressure at home. 6.  Coronary Artery Disease:  Non-obstructive CAD by [facility] in 2012.  As noted, she has atypical chest pain. She had mild non-obstructive disease by cardiac catheterization the past. Continue Plavix, Lipitor, amlodipine.   Current medicines are reviewed at length with the patient today.  The patient does not  have concerns regarding medicines.  The following changes have been made:  As above  Labs/ tests ordered today include:   Orders Placed This Encounter  Procedures  . B Nat Peptide  . Myocardial Perfusion Imaging  . EKG 12-Lead     Disposition:   FU with Dr. Loralie Champagne in 3 months   Signed, Versie Starks, MHS 04/25/2014 10:02 AM    [address], [place], [place]  [address] Phone: [phone]; Fax: [phone]

## 2014-04-25 NOTE — Telephone Encounter (Signed)
-----   Message from Lance Bosch, NP sent at 04/24/2014  8:50 PM EST ----- Kidney function is back to normal. Explain that it was likely elevated d/t dehydration like we discussed in the visit

## 2014-04-26 NOTE — Progress Notes (Signed)
She can see me in CHF clinic.  

## 2014-05-07 ENCOUNTER — Encounter (HOSPITAL_COMMUNITY): Payer: Self-pay

## 2014-05-10 ENCOUNTER — Ambulatory Visit (HOSPITAL_COMMUNITY): Payer: Self-pay | Attending: Physician Assistant | Admitting: Radiology

## 2014-05-10 DIAGNOSIS — R079 Chest pain, unspecified: Secondary | ICD-10-CM | POA: Insufficient documentation

## 2014-05-10 DIAGNOSIS — J449 Chronic obstructive pulmonary disease, unspecified: Secondary | ICD-10-CM | POA: Insufficient documentation

## 2014-05-10 DIAGNOSIS — I2510 Atherosclerotic heart disease of native coronary artery without angina pectoris: Secondary | ICD-10-CM

## 2014-05-10 DIAGNOSIS — R0602 Shortness of breath: Secondary | ICD-10-CM | POA: Insufficient documentation

## 2014-05-10 DIAGNOSIS — Z87891 Personal history of nicotine dependence: Secondary | ICD-10-CM | POA: Insufficient documentation

## 2014-05-10 DIAGNOSIS — I10 Essential (primary) hypertension: Secondary | ICD-10-CM | POA: Insufficient documentation

## 2014-05-10 MED ORDER — REGADENOSON 0.4 MG/5ML IV SOLN
0.4000 mg | Freq: Once | INTRAVENOUS | Status: AC
Start: 2014-05-10 — End: 2014-05-10
  Administered 2014-05-10: 0.4 mg via INTRAVENOUS

## 2014-05-10 MED ORDER — TECHNETIUM TC 99M SESTAMIBI GENERIC - CARDIOLITE
30.0000 | Freq: Once | INTRAVENOUS | Status: AC | PRN
  Administered 2014-05-10: 30 via INTRAVENOUS

## 2014-05-10 MED ORDER — TECHNETIUM TC 99M SESTAMIBI GENERIC - CARDIOLITE
10.0000 | Freq: Once | INTRAVENOUS | Status: AC | PRN
  Administered 2014-05-10: 10 via INTRAVENOUS

## 2014-05-10 NOTE — Progress Notes (Signed)
[facility]LE[facility] [address] [place], [place] [address] [phone]    Cardiology Nuclear Med Study  Morgan Bean is a 49 y.o. female     MRN : [id number]     DOB: 1965-08-27  Procedure Date: 05/10/2014  Nuclear Med Background Indication for Stress Test:  Evaluation for Ischemia, Follow-Up CAD, and Patient seen in hospital on 04-12-2014 for Chest Pain, SOB, Pneumonia, Enzymes negative History:  Asthma, COPD and N/O CAD, '12 MPI: EF: 67% Cocaine Abuse, Cardiac Risk Factors: Hypertension  Symptoms:  Chest Pain and DOE   Nuclear Pre-Procedure Caffeine/Decaff Intake:  None> 12 hrs NPO After: 6:00pm   Lungs:  clear O2 Sat: 99% on room air. IV 0.9% NS with Angio Cath:  22g  IV Site: R Hand x 1, tolerated well IV Started by:  Irven Baltimore, RN  Chest Size (in):  34 Cup Size: C  Height: 5\' 6"  (1.676 m)  Weight:  206 lb (93.441 kg)  BMI:  Body mass index is 33.27 kg/(m^2). Tech Comments:  N/A    Nuclear Med Study 1 or 2 day study: 1 day  Stress Test Type:  Lexiscan  Reading MD: N/A  Order Authorizing Provider:  Loralie Champagne, MD, and Richardson Dopp, [facility]  Resting Radionuclide: Technetium 39m Sestamibi  Resting Radionuclide Dose: 11.0 mCi   Stress Radionuclide:  Technetium 50m Sestamibi  Stress Radionuclide Dose: 33.0 mCi           Stress Protocol Rest HR: 81 Stress HR: 107  Rest BP: 86/67 Stress BP: 113/68  Exercise Time (min): n/a METS: n/a   Predicted Max HR: 170 bpm % Max HR: 62.94 bpm Rate Pressure Product: 12091   Dose of Adenosine (mg):  n/a Dose of Lexiscan: 0.4 mg  Dose of Atropine (mg): n/a Dose of Dobutamine: n/a mcg/kg/min (at max HR)  Stress Test Technologist: Perrin Maltese, EMT-P  Nuclear Technologist:  Earl Many, CNMT     Rest Procedure:  Myocardial perfusion imaging was performed at rest 45 minutes following the intravenous administration of Technetium 68m Sestamibi. Rest ECG: NSR - Normal EKG  Stress Procedure:  The  patient received IV Lexiscan 0.4 mg over 15-seconds.  Technetium 46m Sestamibi injected at 30-seconds. This patient had sob, dizziness, and chest tightness with the Lexiscan injection. Quantitative spect images were obtained after a 45 minute delay. Stress ECG: Slight downsloping of the ST segments in the inferolateral leads in recovery  QPS Raw Data Images:  Mild diaphragmatic and breast attenuation.  Normal left ventricular size. Stress Images:  There is decreased uptake in the mid and basal inferolateral wall. Rest Images:  There is decreased uptake in the mid and basal inferolateral wall. Subtraction (SDS):  No evidence of ischemia. Transient Ischemic Dilatation (Normal <1.22):  1.03 Lung/Heart Ratio (Normal <0.45):  0.28  Quantitative Gated Spect Images QGS EDV:  97 ml QGS ESV:  35 ml  Impression Exercise Capacity:  Lexiscan with no exercise. BP Response:  Normal blood pressure response. Clinical Symptoms:  Mild chest pain/dyspnea. ECG Impression:  Slight downsloping of the ST segments in recovery in the inferolateral leads in recovery Comparison with Prior Nuclear Study: No images to compare  Overall Impression:  Low risk stress nuclear study with a small in size, mild in intensity, fixed defect in the mid and basal inferolateral wall consistent with breast attenuation artifact.  No evidence of ischemia. There was mild downsloping of the ST segments in the inferolateral leads in recovery.  LV Ejection Fraction: 64%.  LV Wall Motion:  NL LV Function; NL Wall Motion  Signed: Fransico Him, MD [facility] 05/10/2014

## 2014-05-11 ENCOUNTER — Encounter: Payer: Self-pay | Admitting: Physician Assistant

## 2014-05-14 ENCOUNTER — Ambulatory Visit (INDEPENDENT_AMBULATORY_CARE_PROVIDER_SITE_OTHER): Payer: No Typology Code available for payment source | Admitting: Neurology

## 2014-05-14 ENCOUNTER — Telehealth: Payer: Self-pay | Admitting: *Deleted

## 2014-05-14 ENCOUNTER — Encounter: Payer: Self-pay | Admitting: Neurology

## 2014-05-14 VITALS — BP 110/70 | HR 84 | Ht 66.0 in | Wt 203.0 lb

## 2014-05-14 DIAGNOSIS — G458 Other transient cerebral ischemic attacks and related syndromes: Secondary | ICD-10-CM

## 2014-05-14 DIAGNOSIS — I672 Cerebral atherosclerosis: Secondary | ICD-10-CM

## 2014-05-14 MED ORDER — CLOPIDOGREL BISULFATE 75 MG PO TABS: 75.0000 mg | ORAL_TABLET | Freq: Every day | ORAL | Status: DC

## 2014-05-14 NOTE — Telephone Encounter (Signed)
pt notified about normal myoview results with verbal understanding 

## 2014-05-14 NOTE — Progress Notes (Signed)
NEUROLOGY FOLLOW UP OFFICE NOTE  Morgan Bean [id number]  HISTORY OF PRESENT ILLNESS: I had the pleasure of seeing Morgan Bean in follow-up in the neurology clinic on 05/14/2014.  The patient was last seen 6 months ago for increased headaches and an episode of expressive aphasia with slurred speech last May 2015. [name] She is again accompanied by her mother who helps supplement the history today.  MRI brain did not show any acute stroke. She does have intracranial atherosclerosis. She was switched to Plavix and denies any further similar symptoms. She is happy to report that the headaches have resolved as well.   She denies any neck pain, no visual changes, nausea/vomiting, no diplopia, dizziness, dysphagia, bowel/bladder dysfunction. She has occasional right low back pain.  HPI: This is a pleasant 49 yo RH woman with a history of hypertension, hyperlipidemia, left pontine stroke in 2009, leukopenia and anemia, history of cocaine abuse (last use November 2014), syphilis, who presented with headaches and a transient episode of expressive aphasia. She went to the ER on 08/06/13 for 10/10 headache and speech difficulties where she reported word-finding difficulties (per daughter "not making sense") because she couldn't say the right word and it took a long time to get a word out. Her speech was also slurred. She denied any difficulties with comprehension. CBC, CMP, and head CT done were unremarkable. She was discharged home to follow-up with Neurology. She reports the speech difficulties lasted for 24 hours. She continued to have daily headaches occuring twice a day, lasting 5 minutes or so, 3/10 in intensity, with throbbing over either temporal region, or a cap-like pressure over the top of her head. She does not take any medication, stating the headaches are not bad, but are concerning because she has never had daily headaches in the past. There is no associated nausea, vomiting, photo/phonophobia, no  focal numbness/tingling/weakness.   In 2009, she suddenly fell and noted right hand weakness and slurred speech. I personally reviewed MRI brain from 2009 which showed restricted diffusion in the left pons and inferior mid brain without mass effect or hemorrhage. There was moderate white matter disease advanced for age. It was noted that the main differential diagnosis in a patient of this age is multiple sclerosis. Her MRA showed widespread intracranial atherosclerosis, basilar tip is ectatic measuring roughly 3 x 6.5 mm, without saccular aneurysm formation, mild to moderate stenoses of the left MCA origin, right MCA trifurcation and left ICA pre cavernous segment are noted. She was discharged on aspirin. She had left arm pain last year and tells me she was started on Lipitor at that time. She stopped smoking in March 2015 after smoking 1/2 PPD since age 21. There is no family history of headaches or aneurysm.  Diagnostic Data: I personally reviewed MRI brain with and without contrast done 09/2013 which showed generalized cerebral atrophy, advanced for patient age, extensive T2/FLAIR hyperintensity within the periventricular and deep white  matter both cerebral hemispheres. There is a remote lacunar infarct in the white matter of the corona radiata of both cerebral hemispheres, remote lacunar infarct present within the left pons. Her MRA head was abnormal with multifocal atherosclerotic stenosis in the cavernous right ICA, measuring up to 50-60% by NASCET criteria, short segment stenosis of approximately 30-40% within the pre cavernous left ICA, smooth atherosclerotic stenosis of approximately 30-40% within the cavernous left ICA, additional widespread multi focal intracranial atherosclerosis, predominantly involving the in M1 segments and basilar artery. Her MRI C-spine did not show any  myelopathy, there was spondylosis with mild to moderate foraminal narrowing at C5-6 bilaterally. There was note of  persistent upper cervical adenopathy and mucosal edema with heterogeneous enhancement, appearing slightly improved and presumably inflammatory.   PAST MEDICAL HISTORY: Past Medical History  Diagnosis Date  . Coronary artery disease     Lexiscan myoview (10/12) with significant ST depression upon Lexiscan injection but no ischemia or infarction on perfusion images. Left heart cath (10/12): 30% mid LAD, 30% ostial D1, 50% ostial D2.    [name] Hypertension   . Tricuspid regurgitation   . Iron deficiency     hx of  . Polysubstance abuse     Prior cocaine  . Tobacco abuse   . Leukocytoclastic vasculitis     Rash across lower body, occurred in 9/11, diagnosed by skin biopsy. ANA positive. Thought to be secondary to cocaine use.  . Pulmonary HTN     Echo (9/11) with EF 65%, mild LVH, mild AI, mild MR, moderate to possibly severe TR with PA systolic pressure 52 mmHg. Echo (10/12): severe LV hypertrophy, EF 60-65%, mild MR, mild AI, moderate to severe tricuspid regurgitation, PA systolic pressure 38 mmHg.  Echo 4/14: moderate LVH, EF 65%, normal wall motion, diastolic dysfunction, mild AI, mild MR  . Asthma   . Shortness of breath     a. PFTs 5/14:  FEV1/FVC 99% predicted; FVC 60% predicted; DLCO mildly reduced, mild restriction, air trapping.;  b.  seen by pulmo (Dr. [name]) 5/14: mainly upper airway symptoms - ACE d/c'd (sx's better)  . Chronic diastolic CHF (congestive heart failure)   . Constipation   . Lymphadenopathy 03/21/2013  . Leukopenia 03/21/2013  . Unspecified deficiency anemia 03/29/2013  . Anginal pain     none in past year  . Anxiety   . Mental disorder   . Depression   . Stroke 2010ish  . Arthritis   . Pancytopenia   . Hyperlipemia   . Syphilis 04/25/2013  . Candidiasis 06/23/2013  . Anemia of other chronic disease 11/13/2013  . Cough 11/13/2013  . Lymphadenopathy 01/30/2014  . Headache 02/22/2014  . Hx of cardiovascular stress test     Lexiscan Myoview (2/16): Breast attenuation  artifact, no ischemia, EF 64%; low risk    MEDICATIONS: Current Outpatient Prescriptions on File Prior to Visit  Medication Sig Dispense Refill  . albuterol (PROVENTIL HFA;VENTOLIN HFA) 108 (90 BASE) MCG/ACT inhaler Inhale 2 puffs into the lungs every 2 (two) hours as needed for wheezing or shortness of breath (cough). 1 Inhaler 4  . albuterol (PROVENTIL) (2.5 MG/3ML) 0.083% nebulizer solution Take 2.5 mg by nebulization every 4 (four) hours as needed for wheezing or shortness of breath.    [name] amLODipine (NORVASC) 10 MG tablet TAKE ONE TABLET DAILY 30 tablet 5  . atorvastatin (LIPITOR) 80 MG tablet Take 1 tablet (80 mg total) by mouth daily. 30 tablet 3  . buPROPion (WELLBUTRIN SR) 150 MG 12 hr tablet Take 150 mg by mouth 2 (two) times daily.    . butalbital-acetaminophen-caffeine (FIORICET) 50-325-40 MG per tablet Take 1 tablet by mouth every 6 (six) hours as needed for headache. 30 tablet 0  . citalopram (CELEXA) 40 MG tablet Take 1 tablet (40 mg total) by mouth daily. 30 tablet 1  . cloNIDine (CATAPRES) 0.2 MG tablet Take 1 tablet (0.2 mg total) by mouth 2 (two) times daily. 60 tablet 11  . ferrous sulfate 325 (65 FE) MG tablet Take 650 mg by mouth daily with breakfast.    .  furosemide (LASIX) 40 MG tablet TAKE 1/2 TABLET BY MOUTH TWICE DAILY 30 tablet 1  . hydrOXYzine (ATARAX/VISTARIL) 25 MG tablet Take 25 mg by mouth at bedtime as needed (sleep).    . loratadine (CLARITIN) 10 MG tablet Take 1 tablet (10 mg total) by mouth daily. 30 tablet 3  . Multiple Vitamin (MULTIVITAMIN WITH MINERALS) TABS Take 1 tablet by mouth daily.    [name] omeprazole (PRILOSEC) 40 MG capsule Take 40 mg by mouth daily.    . polyethylene glycol powder (GLYCOLAX/MIRALAX) powder Take 17 g by mouth daily as needed (constipation).     . potassium chloride (K-DUR,KLOR-CON) 10 MEQ tablet Take 1 tablet (10 mEq total) by mouth 2 (two) times daily with a meal. 60 tablet 3  . traZODone (DESYREL) 100 MG tablet Take 1 tablet (100 mg  total) by mouth at bedtime. 30 tablet 1  Plavix 75mg  daily No current facility-administered medications on file prior to visit.    ALLERGIES: Allergies  Allergen Reactions  . Ciprofloxacin Itching    FAMILY HISTORY: Family History  Problem Relation Age of Onset  . Coronary artery disease Mother   . Diabetes Mother   . Breast cancer Maternal Grandmother   . Diabetes Maternal Grandmother   . Diabetes Father   . Hypertension Father   . Coronary artery disease Father   . Heart attack Maternal Grandmother   . Stroke Maternal Grandmother   . Heart attack Father     SOCIAL HISTORY: History   Social History  . Marital Status: Single    Spouse Name: N/A  . Number of Children: 2  . Years of Education: N/A   Occupational History  . unemployed    Social History Main Topics  . Smoking status: Former Smoker -- 0.25 packs/day for 30 years    Types: Cigarettes    Quit date: 05/23/2013  . Smokeless tobacco: Never Used  . Alcohol Use: No     Comment: pint of wine 2-3 times a week--ocassional  . Drug Use: 1.00 per week    Special: "Crack" cocaine     Comment: none since 02/04/13  . Sexual Activity: Yes   Other Topics Concern  . Not on file   Social History Narrative   Lives with mom, sisters and brothers.    REVIEW OF SYSTEMS: Constitutional: No fevers, chills, or sweats, no generalized fatigue, change in appetite Eyes: No visual changes, double vision, eye pain Ear, nose and throat: No hearing loss, ear pain, nasal congestion, sore throat Cardiovascular: No chest pain, palpitations Respiratory:  No shortness of breath at rest or with exertion, wheezes GastrointestinaI: No nausea, vomiting, diarrhea, abdominal pain, fecal incontinence Genitourinary:  No dysuria, urinary retention or frequency Musculoskeletal:  No neck pain, + back pain Integumentary: No rash, pruritus, skin lesions Neurological: as above Psychiatric: No depression, insomnia, anxiety Endocrine: No  palpitations, fatigue, diaphoresis, mood swings, change in appetite, change in weight, increased thirst Hematologic/Lymphatic:  No anemia, purpura, petechiae. Allergic/Immunologic: no itchy/runny eyes, nasal congestion, recent allergic reactions, rashes  PHYSICAL EXAM: Filed Vitals:   05/14/14 1404  BP: 110/70  Pulse: 84   General: No acute distress Head:  Normocephalic/atraumatic Neck: supple, no paraspinal tenderness, full range of motion Heart:  Regular rate and rhythm Lungs:  Clear to auscultation bilaterally Back: No paraspinal tenderness Skin/Extremities: No rash, no edema Neurological Exam:alert and oriented to person, place, and time, no dysarthria or aphasia, Fund of knowledge is appropriate. Recent and remote memory are intact. Attention and concentration are  normal. Able to name objects and repeat phrases.  Cranial nerves:  CN I: not tested  CN II: pupils equal, round and reactive to light, visual fields intact, fundi unremarkable.  CN III, IV, VI: full range of motion, no nystagmus, no ptosis  CN V: facial sensation intact  CN VII: upper and lower face symmetric  CN VIII: hearing intact to finger rub  CN IX, X: gag intact, uvula midline  CN XI: sternocleidomastoid and trapezius muscles intact  CN XII: tongue midline  Bulk & Tone: normal, no fasciculations.  Motor: 5/5 throughout with no pronator drift.  Sensation: intact to light touch. No extinction to double simultaneous stimulation. Romberg test negative Deep Tendon Reflexes: brisk +3 throughout with bilateral Hoffman's sign, hyperactive pectoralis reflex, and 3-beats unsustained clonus on right ankle, 2-beats on left ankle (similar to prior) Plantar responses: downgoing bilaterally  Cerebellar: no incoordination on finger to nose Gait: narrow-based and steady, slight difficulty with tandem walk but able (similar to prior) Tremor: none   IMPRESSION: This is a pleasant 49 yo RH woman with a history of  hypertension, hyperlipidemia, syphilis, cocaine abuse (last use 01/2013), left pontine stroke in 2009 with intracranial atherosclerosis and white matter changes advanced for age again noted on repeat imaging July 2015 after she had new onset daily headaches and an episode suggestive of expressive aphasia with slurred speech last 08/06/13, concerning for TIA. No acute changes on MRI.She is now on Plavix for secondary stroke prevention and continues on statin therapy. Continue control vascular risk factors. Headaches have resolved. She knows to go to the ER if symptoms recur or progress. She will follow-up in 1 year or earlier if needed.   Thank you for allowing me to participate in her care.  Please do not hesitate to call for any questions or concerns.  The duration of this appointment visit was 15 minutes of face-to-face time with the patient.  Greater than 50% of this time was spent in counseling, explanation of diagnosis, planning of further management, and coordination of care.   Ellouise Newer, M.D.   CC: Dr. Doreene Burke

## 2014-05-14 NOTE — Patient Instructions (Signed)
1. Continue daily Plavix 2. Continue control of BP, cholesterol 3. If speech issues recur, go to ER immediately 4. Follow-up in 1 year, call our office for any changes

## 2014-05-16 ENCOUNTER — Encounter: Payer: Self-pay | Admitting: Neurology

## 2014-05-17 ENCOUNTER — Other Ambulatory Visit: Payer: Self-pay | Admitting: Cardiology

## 2014-05-18 ENCOUNTER — Ambulatory Visit: Payer: Self-pay | Attending: Internal Medicine

## 2014-05-21 ENCOUNTER — Encounter: Payer: Self-pay | Admitting: Hematology and Oncology

## 2014-05-21 ENCOUNTER — Telehealth: Payer: Self-pay | Admitting: Hematology and Oncology

## 2014-05-21 ENCOUNTER — Other Ambulatory Visit (HOSPITAL_BASED_OUTPATIENT_CLINIC_OR_DEPARTMENT_OTHER): Payer: Self-pay

## 2014-05-21 ENCOUNTER — Ambulatory Visit (HOSPITAL_BASED_OUTPATIENT_CLINIC_OR_DEPARTMENT_OTHER): Payer: Self-pay | Admitting: Hematology and Oncology

## 2014-05-21 VITALS — BP 122/86 | HR 79 | Temp 97.7°F | Resp 19 | Ht 66.0 in | Wt 205.2 lb

## 2014-05-21 DIAGNOSIS — D638 Anemia in other chronic diseases classified elsewhere: Secondary | ICD-10-CM

## 2014-05-21 DIAGNOSIS — D709 Neutropenia, unspecified: Secondary | ICD-10-CM

## 2014-05-21 LAB — CBC WITH DIFFERENTIAL/PLATELET
BASO%: 0.5 % (ref 0.0–2.0)
Basophils Absolute: 0 10*3/uL (ref 0.0–0.1)
EOS%: 2.5 % (ref 0.0–7.0)
Eosinophils Absolute: 0.1 10*3/uL (ref 0.0–0.5)
HCT: 34.3 % — ABNORMAL LOW (ref 34.8–46.6)
HGB: 11 g/dL — ABNORMAL LOW (ref 11.6–15.9)
LYMPH%: 60.1 % — ABNORMAL HIGH (ref 14.0–49.7)
MCH: 25.1 pg (ref 25.1–34.0)
MCHC: 32.1 g/dL (ref 31.5–36.0)
MCV: 78.1 fL — ABNORMAL LOW (ref 79.5–101.0)
MONO#: 0.4 10*3/uL (ref 0.1–0.9)
MONO%: 20.2 % — ABNORMAL HIGH (ref 0.0–14.0)
NEUT%: 16.7 % — ABNORMAL LOW (ref 38.4–76.8)
NEUTROS ABS: 0.3 10*3/uL — AB (ref 1.5–6.5)
PLATELETS: 172 10*3/uL (ref 145–400)
RBC: 4.39 10*6/uL (ref 3.70–5.45)
RDW: 17.2 % — ABNORMAL HIGH (ref 11.2–14.5)
WBC: 2 10*3/uL — AB (ref 3.9–10.3)
lymph#: 1.2 10*3/uL (ref 0.9–3.3)

## 2014-05-21 LAB — MORPHOLOGY: PLT EST: ADEQUATE

## 2014-05-21 NOTE — Telephone Encounter (Signed)
appts made and avs printed for pt  Morgan Bean °

## 2014-05-22 NOTE — Assessment & Plan Note (Signed)
This is likely anemia of chronic disease. The patient denies recent history of bleeding such as epistaxis, hematuria or hematochezia. She is asymptomatic from the anemia. We will observe for now.  She does not require transfusion now.   

## 2014-05-22 NOTE — Progress Notes (Signed)
[facility] OFFICE PROGRESS NOTE  Morgan Chessman, MD SUMMARY OF HEMATOLOGIC HISTORY:  This is a pleasant lady who is being referred here because of background of severe pancytopenia, lymphadenopathy and abnormal lymph node and bone marrow biopsy. Further evaluation suspect the patient may have syphilis. She was treated with doxycycline with worsening pancytopenia. Her antibiotic treatment was discontinued. From 06/02/2013 to 06/09/2013, the patient was admitted to the hospital for further workup for neutropenic fever. She was placed on G-CSF with no response to therapy. Repeat bone marrow biopsy was done. The patient was given broad-spectrum intravenous antibiotics and subsequently discharged for further followup as an outpatient.  On 06/14/2013, I started her on prednisone 60 mg daily. She was readmitted to the hospital again for neutropenic fever. 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 On 08/04/2013, I advised further prednisone taper. She is given G-CSF for severe neutropenia with no response. She was subsequently observed. In August 2015, she was noted to a recurrence of severe neutropenia. She was sent back on prednisone with initial good response to treatment but then relapsed soon after initiation of prednisone taper  on 02/20/2014, she was given IVIG treatment. On 03/06/2014, prednisone taper is initiated to 2.5 mg daily. INTERVAL HISTORY: Morgan Bean 49 y.o. female returns for further follow-up. She has excellent energy level. Denies recent infection. The patient denies any recent signs or symptoms of bleeding such as spontaneous epistaxis, hematuria or hematochezia.  I have reviewed the past medical history, past surgical history, social history and family history with the patient and they are unchanged from previous note.  ALLERGIES:  is allergic to ciprofloxacin.  MEDICATIONS:  Current Outpatient  Prescriptions  Medication Sig Dispense Refill  . albuterol (PROVENTIL HFA;VENTOLIN HFA) 108 (90 BASE) MCG/ACT inhaler Inhale 2 puffs into the lungs every 2 (two) hours as needed for wheezing or shortness of breath (cough). 1 Inhaler 4  . amLODipine (NORVASC) 10 MG tablet TAKE ONE TABLET DAILY 30 tablet 5  . atorvastatin (LIPITOR) 80 MG tablet Take 1 tablet (80 mg total) by mouth daily. 30 tablet 3  . buPROPion (WELLBUTRIN SR) 150 MG 12 hr tablet Take 150 mg by mouth 2 (two) times daily.    . butalbital-acetaminophen-caffeine (FIORICET) 50-325-40 MG per tablet Take 1 tablet by mouth every 6 (six) hours as needed for headache. 30 tablet 0  . citalopram (CELEXA) 40 MG tablet Take 1 tablet (40 mg total) by mouth daily. 30 tablet 1  . cloNIDine (CATAPRES) 0.2 MG tablet Take 1 tablet (0.2 mg total) by mouth 2 (two) times daily. 60 tablet 11  . clopidogrel (PLAVIX) 75 MG tablet Take 1 tablet (75 mg total) by mouth daily. 90 tablet 3  . ferrous sulfate 325 (65 FE) MG tablet Take 650 mg by mouth daily with breakfast.    . furosemide (LASIX) 40 MG tablet TAKE 1/2 TABLET BY MOUTH TWICE DAILY 30 tablet 1  . hydrOXYzine (ATARAX/VISTARIL) 25 MG tablet Take 25 mg by mouth at bedtime as needed (sleep).    . loratadine (CLARITIN) 10 MG tablet Take 1 tablet (10 mg total) by mouth daily. 30 tablet 3  . Multiple Vitamin (MULTIVITAMIN WITH MINERALS) TABS Take 1 tablet by mouth daily.    [name] omeprazole (PRILOSEC) 40 MG capsule Take 40 mg by mouth daily.    . polyethylene glycol powder (GLYCOLAX/MIRALAX) powder Take 17 g by mouth daily as needed (constipation).     . potassium chloride (  K-DUR,KLOR-CON) 10 MEQ tablet Take 1 tablet (10 mEq total) by mouth 2 (two) times daily with a meal. 60 tablet 3  . traZODone (DESYREL) 100 MG tablet Take 1 tablet (100 mg total) by mouth at bedtime. 30 tablet 1   No current facility-administered medications for this visit.     REVIEW OF SYSTEMS:   Constitutional: Denies fevers,  chills or night sweats Eyes: Denies blurriness of vision Ears, nose, mouth, throat, and face: Denies mucositis or sore throat Respiratory: Denies cough, dyspnea or wheezes Cardiovascular: Denies palpitation, chest discomfort or lower extremity swelling Gastrointestinal:  Denies nausea, heartburn or change in bowel habits Skin: Denies abnormal skin rashes Lymphatics: Denies new lymphadenopathy or easy bruising Neurological:Denies numbness, tingling or new weaknesses Behavioral/Psych: Mood is stable, no new changes  All other systems were reviewed with the patient and are negative.  PHYSICAL EXAMINATION: ECOG PERFORMANCE STATUS: 0 - Asymptomatic  Filed Vitals:   05/21/14 0850  BP: 122/86  Pulse: 79  Temp: 97.7 F (36.5 C)  Resp: 19   Filed Weights   05/21/14 0850  Weight: 205 lb 3.2 oz (93.078 kg)    GENERAL:alert, no distress and comfortable SKIN: skin color, texture, turgor are normal, no rashes or significant lesions EYES: normal, Conjunctiva are pink and non-injected, sclera clear OROPHARYNX:no exudate, no erythema and lips, buccal mucosa, and tongue normal  NECK: supple, thyroid normal size, non-tender, without nodularity LYMPH:  no palpable lymphadenopathy in the cervical, axillary or inguinal LUNGS: clear to auscultation and percussion with normal breathing effort HEART: regular rate & rhythm and no murmurs and no lower extremity edema ABDOMEN:abdomen soft, non-tender and normal bowel sounds Musculoskeletal:no cyanosis of digits and no clubbing  NEURO: alert & oriented x 3 with fluent speech, no focal motor/sensory deficits  LABORATORY DATA:  I have reviewed the data as listed Results for orders placed or performed in visit on 05/21/14 (from the past 48 hour(s))  CBC with Differential     Status: Abnormal   Collection Time: 05/21/14  8:37 AM  Result Value Ref Range   WBC 2.0 (L) 3.9 - 10.3 10e3/uL   NEUT# 0.3 (LL) 1.5 - 6.5 10e3/uL   HGB 11.0 (L) 11.6 - 15.9 g/dL    HCT 34.3 (L) 34.8 - 46.6 %   Platelets 172 145 - 400 10e3/uL   MCV 78.1 (L) 79.5 - 101.0 fL   MCH 25.1 25.1 - 34.0 pg   MCHC 32.1 31.5 - 36.0 g/dL   RBC 4.39 3.70 - 5.45 10e6/uL   RDW 17.2 (H) 11.2 - 14.5 %   lymph# 1.2 0.9 - 3.3 10e3/uL   MONO# 0.4 0.1 - 0.9 10e3/uL   Eosinophils Absolute 0.1 0.0 - 0.5 10e3/uL   Basophils Absolute 0.0 0.0 - 0.1 10e3/uL   NEUT% 16.7 (L) 38.4 - 76.8 %   LYMPH% 60.1 (H) 14.0 - 49.7 %   MONO% 20.2 (H) 0.0 - 14.0 %   EOS% 2.5 0.0 - 7.0 %   BASO% 0.5 0.0 - 2.0 %  Morphology     Status: None   Collection Time: 05/21/14  8:37 AM  Result Value Ref Range   Ovalocytes Few Negative   White Cell Comments Variant Lymphs    PLT EST Adequate Adequate    Lab Results  Component Value Date   WBC 2.0* 05/21/2014   HGB 11.0* 05/21/2014   HCT 34.3* 05/21/2014   MCV 78.1* 05/21/2014   PLT 172 05/21/2014    ASSESSMENT & PLAN:  Neutropenia She  has persistent, severe neutropenia, unresponsive to prednisone and recent G-CSF. She had multiple bone marrow biopsy performed in the past and overall consensus from multiple subspecialties were this is related to cocaine abuse. The patient denies cocaine abuse for over 6 months. Previously, she had nonspecific lymphadenopathy and lymph node biopsy was non-diagnostic. Repeat CT scan show nonspecific lymphadenopathy that overall is improving. In the meantime, she continue prednisone taper to 2.5 mg. After review of her case at the most recent hematology tumor board, I recommend a trial of IVIG.  She has no response to IVIG. We discussed other options including rituximab, observation, or referral to tertiary center. Due to lack of symptoms, the patient elected for observation only and I think it is reasonable. I will see her back in 3 months with repeat blood work history and physical examination. I reinforced the importance of hand hygiene and close communication with me. Should she suspect signs of infection, she will call  me immediately for further evaluation and management.   Anemia in chronic illness This is likely anemia of chronic disease. The patient denies recent history of bleeding such as epistaxis, hematuria or hematochezia. She is asymptomatic from the anemia. We will observe for now.  She does not require transfusion now.     All questions were answered. The patient knows to call the clinic with any problems, questions or concerns. No barriers to learning was detected.  I spent 15 minutes counseling the patient face to face. The total time spent in the appointment was 20 minutes and more than 50% was on counseling.     [facility], , MD 3/1/201611:16 AM

## 2014-05-22 NOTE — Assessment & Plan Note (Addendum)
She has persistent, severe neutropenia, unresponsive to prednisone and recent G-CSF. She had multiple bone marrow biopsy performed in the past and overall consensus from multiple subspecialties were this is related to cocaine abuse. The patient denies cocaine abuse for over 6 months. Previously, she had nonspecific lymphadenopathy and lymph node biopsy was non-diagnostic. Repeat CT scan show nonspecific lymphadenopathy that overall is improving. In the meantime, she continue prednisone taper to 2.5 mg. After review of her case at the most recent hematology tumor board, I recommend a trial of IVIG.  She has no response to IVIG. We discussed other options including rituximab, observation, or referral to tertiary center. Due to lack of symptoms, the patient elected for observation only and I think it is reasonable. I will see her back in 3 months with repeat blood work history and physical examination. I reinforced the importance of hand hygiene and close communication with me. Should she suspect signs of infection, she will call me immediately for further evaluation and management.

## 2014-05-25 ENCOUNTER — Other Ambulatory Visit: Payer: Self-pay | Admitting: Internal Medicine

## 2014-05-25 MED ORDER — ALBUTEROL SULFATE HFA 108 (90 BASE) MCG/ACT IN AERS: 2.0000 | INHALATION_SPRAY | RESPIRATORY_TRACT | Status: DC | PRN

## 2014-06-01 ENCOUNTER — Emergency Department (HOSPITAL_COMMUNITY): Payer: Self-pay

## 2014-06-01 ENCOUNTER — Inpatient Hospital Stay (HOSPITAL_COMMUNITY)
Admission: EM | Admit: 2014-06-01 | Discharge: 2014-06-03 | DRG: 153 | Disposition: A | Discharge status: Home / Self Care | Payer: Self-pay | Attending: Internal Medicine | Admitting: Internal Medicine

## 2014-06-01 ENCOUNTER — Encounter (HOSPITAL_COMMUNITY): Payer: Self-pay | Admitting: Emergency Medicine

## 2014-06-01 DIAGNOSIS — E785 Hyperlipidemia, unspecified: Secondary | ICD-10-CM | POA: Diagnosis present

## 2014-06-01 DIAGNOSIS — F333 Major depressive disorder, recurrent, severe with psychotic symptoms: Secondary | ICD-10-CM | POA: Diagnosis present

## 2014-06-01 DIAGNOSIS — I2510 Atherosclerotic heart disease of native coronary artery without angina pectoris: Secondary | ICD-10-CM | POA: Diagnosis present

## 2014-06-01 DIAGNOSIS — F1411 Cocaine abuse, in remission: Secondary | ICD-10-CM | POA: Diagnosis present

## 2014-06-01 DIAGNOSIS — R591 Generalized enlarged lymph nodes: Secondary | ICD-10-CM | POA: Diagnosis present

## 2014-06-01 DIAGNOSIS — I071 Rheumatic tricuspid insufficiency: Secondary | ICD-10-CM | POA: Diagnosis present

## 2014-06-01 DIAGNOSIS — R221 Localized swelling, mass and lump, neck: Secondary | ICD-10-CM

## 2014-06-01 DIAGNOSIS — Z87891 Personal history of nicotine dependence: Secondary | ICD-10-CM

## 2014-06-01 DIAGNOSIS — I10 Essential (primary) hypertension: Secondary | ICD-10-CM | POA: Diagnosis present

## 2014-06-01 DIAGNOSIS — L0211 Cutaneous abscess of neck: Secondary | ICD-10-CM | POA: Diagnosis present

## 2014-06-01 DIAGNOSIS — J392 Other diseases of pharynx: Secondary | ICD-10-CM | POA: Diagnosis present

## 2014-06-01 DIAGNOSIS — D638 Anemia in other chronic diseases classified elsewhere: Secondary | ICD-10-CM | POA: Diagnosis present

## 2014-06-01 DIAGNOSIS — M1990 Unspecified osteoarthritis, unspecified site: Secondary | ICD-10-CM | POA: Diagnosis present

## 2014-06-01 DIAGNOSIS — Z7902 Long term (current) use of antithrombotics/antiplatelets: Secondary | ICD-10-CM

## 2014-06-01 DIAGNOSIS — J45909 Unspecified asthma, uncomplicated: Secondary | ICD-10-CM | POA: Diagnosis present

## 2014-06-01 DIAGNOSIS — I5032 Chronic diastolic (congestive) heart failure: Secondary | ICD-10-CM | POA: Diagnosis present

## 2014-06-01 DIAGNOSIS — Z79899 Other long term (current) drug therapy: Secondary | ICD-10-CM

## 2014-06-01 DIAGNOSIS — F339 Major depressive disorder, recurrent, unspecified: Secondary | ICD-10-CM | POA: Diagnosis present

## 2014-06-01 DIAGNOSIS — M310 Hypersensitivity angiitis: Secondary | ICD-10-CM | POA: Diagnosis present

## 2014-06-01 DIAGNOSIS — F419 Anxiety disorder, unspecified: Secondary | ICD-10-CM | POA: Diagnosis present

## 2014-06-01 DIAGNOSIS — D709 Neutropenia, unspecified: Secondary | ICD-10-CM | POA: Diagnosis present

## 2014-06-01 DIAGNOSIS — J0430 Supraglottitis, unspecified, without obstruction: Principal | ICD-10-CM | POA: Diagnosis present

## 2014-06-01 DIAGNOSIS — J029 Acute pharyngitis, unspecified: Secondary | ICD-10-CM | POA: Diagnosis present

## 2014-06-01 DIAGNOSIS — F1410 Cocaine abuse, uncomplicated: Secondary | ICD-10-CM | POA: Diagnosis present

## 2014-06-01 DIAGNOSIS — I272 Other secondary pulmonary hypertension: Secondary | ICD-10-CM | POA: Diagnosis present

## 2014-06-01 LAB — RAPID URINE DRUG SCREEN, HOSP PERFORMED
AMPHETAMINES: NOT DETECTED
BARBITURATES: NOT DETECTED
Benzodiazepines: NOT DETECTED
COCAINE: POSITIVE — AB
Opiates: NOT DETECTED
TETRAHYDROCANNABINOL: NOT DETECTED

## 2014-06-01 LAB — DIFFERENTIAL
BASOS ABS: 0 10*3/uL (ref 0.0–0.1)
BASOS PCT: 1 % (ref 0–1)
EOS ABS: 0 10*3/uL (ref 0.0–0.7)
EOS PCT: 1 % (ref 0–5)
Lymphocytes Relative: 53 % — ABNORMAL HIGH (ref 12–46)
Lymphs Abs: 1 10*3/uL (ref 0.7–4.0)
MONOS PCT: 45 % — AB (ref 3–12)
Monocytes Absolute: 0.9 10*3/uL (ref 0.1–1.0)
NEUTROS ABS: 0 10*3/uL — AB (ref 1.7–7.7)
Neutrophils Relative %: 0 % — ABNORMAL LOW (ref 43–77)

## 2014-06-01 LAB — BASIC METABOLIC PANEL
Anion gap: 6 (ref 5–15)
BUN: 9 mg/dL (ref 6–23)
CALCIUM: 8.8 mg/dL (ref 8.4–10.5)
CO2: 22 mmol/L (ref 19–32)
Chloride: 109 mmol/L (ref 96–112)
Creatinine, Ser: 0.95 mg/dL (ref 0.50–1.10)
GFR calc Af Amer: 80 mL/min — ABNORMAL LOW (ref >90)
GFR, EST NON AFRICAN AMERICAN: 69 mL/min — AB (ref >90)
Glucose, Bld: 117 mg/dL — ABNORMAL HIGH (ref 70–99)
POTASSIUM: 3.6 mmol/L (ref 3.5–5.1)
SODIUM: 137 mmol/L (ref 135–145)

## 2014-06-01 LAB — PATHOLOGIST SMEAR REVIEW

## 2014-06-01 LAB — HEPATIC FUNCTION PANEL
ALBUMIN: 3.7 g/dL (ref 3.5–5.2)
ALT: 23 U/L (ref 0–35)
AST: 25 U/L (ref 0–37)
Alkaline Phosphatase: 140 U/L — ABNORMAL HIGH (ref 39–117)
Bilirubin, Direct: <0.1 mg/dL (ref 0.0–0.5)
TOTAL PROTEIN: 9.4 g/dL — AB (ref 6.0–8.3)
Total Bilirubin: 0.5 mg/dL (ref 0.3–1.2)

## 2014-06-01 LAB — CBC
HEMATOCRIT: 33.3 % — AB (ref 36.0–46.0)
HEMOGLOBIN: 10.4 g/dL — AB (ref 12.0–15.0)
MCH: 24.4 pg — ABNORMAL LOW (ref 26.0–34.0)
MCHC: 31.2 g/dL (ref 30.0–36.0)
MCV: 78 fL (ref 78.0–100.0)
Platelets: 183 10*3/uL (ref 150–400)
RBC: 4.27 MIL/uL (ref 3.87–5.11)
RDW: 17 % — AB (ref 11.5–15.5)
WBC: 1.9 10*3/uL — ABNORMAL LOW (ref 4.0–10.5)

## 2014-06-01 LAB — RAPID STREP SCREEN (MED CTR MEBANE ONLY): Streptococcus, Group A Screen (Direct): NEGATIVE

## 2014-06-01 LAB — I-STAT TROPONIN, ED: TROPONIN I, POC: 0 ng/mL (ref 0.00–0.08)

## 2014-06-01 LAB — PROTIME-INR
INR: 1.08 (ref 0.00–1.49)
Prothrombin Time: 14.1 seconds (ref 11.6–15.2)

## 2014-06-01 LAB — SAVE SMEAR

## 2014-06-01 MED ORDER — AMLODIPINE BESYLATE 10 MG PO TABS
10.0000 mg | ORAL_TABLET | Freq: Every day | ORAL | Status: DC
  Administered 2014-06-01 – 2014-06-03 (×3): 10 mg via ORAL
  Filled 2014-06-01 (×3): qty 1

## 2014-06-01 MED ORDER — CLINDAMYCIN PHOSPHATE 600 MG/50ML IV SOLN
600.0000 mg | Freq: Three times a day (TID) | INTRAVENOUS | Status: DC
  Administered 2014-06-01 – 2014-06-03 (×5): 600 mg via INTRAVENOUS
  Filled 2014-06-01 (×6): qty 50

## 2014-06-01 MED ORDER — CETYLPYRIDINIUM CHLORIDE 0.05 % MT LIQD
7.0000 mL | Freq: Two times a day (BID) | OROMUCOSAL | Status: DC
  Administered 2014-06-01 – 2014-06-03 (×4): 7 mL via OROMUCOSAL

## 2014-06-01 MED ORDER — CLONIDINE HCL 0.2 MG PO TABS
0.2000 mg | ORAL_TABLET | Freq: Two times a day (BID) | ORAL | Status: DC
Start: 2014-06-01 — End: 2014-06-03
  Administered 2014-06-01 – 2014-06-03 (×4): 0.2 mg via ORAL
  Filled 2014-06-01 (×5): qty 1

## 2014-06-01 MED ORDER — CITALOPRAM HYDROBROMIDE 40 MG PO TABS
40.0000 mg | ORAL_TABLET | Freq: Every day | ORAL | Status: DC
  Administered 2014-06-01 – 2014-06-03 (×3): 40 mg via ORAL
  Filled 2014-06-01 (×3): qty 1

## 2014-06-01 MED ORDER — ONDANSETRON HCL 4 MG PO TABS: 4.0000 mg | ORAL_TABLET | Freq: Four times a day (QID) | ORAL | Status: DC | PRN

## 2014-06-01 MED ORDER — ONDANSETRON HCL 4 MG/2ML IJ SOLN: 4.0000 mg | Freq: Four times a day (QID) | INTRAMUSCULAR | Status: DC | PRN

## 2014-06-01 MED ORDER — KETOROLAC TROMETHAMINE 30 MG/ML IJ SOLN
30.0000 mg | Freq: Once | INTRAMUSCULAR | Status: AC
  Administered 2014-06-01: 30 mg via INTRAVENOUS
  Filled 2014-06-01: qty 1

## 2014-06-01 MED ORDER — MORPHINE SULFATE (CONCENTRATE) 10 MG/0.5ML PO SOLN: 10.0000 mg | ORAL | Status: DC | PRN

## 2014-06-01 MED ORDER — DEXAMETHASONE SODIUM PHOSPHATE 10 MG/ML IJ SOLN
10.0000 mg | Freq: Once | INTRAMUSCULAR | Status: AC
  Administered 2014-06-01: 10 mg via INTRAVENOUS
  Filled 2014-06-01: qty 1

## 2014-06-01 MED ORDER — LORATADINE 10 MG PO TABS
10.0000 mg | ORAL_TABLET | Freq: Every day | ORAL | Status: DC
  Administered 2014-06-01 – 2014-06-03 (×3): 10 mg via ORAL
  Filled 2014-06-01 (×3): qty 1

## 2014-06-01 MED ORDER — BUPROPION HCL ER (SR) 150 MG PO TB12
150.0000 mg | ORAL_TABLET | Freq: Two times a day (BID) | ORAL | Status: DC
  Administered 2014-06-01 – 2014-06-03 (×4): 150 mg via ORAL
  Filled 2014-06-01 (×5): qty 1

## 2014-06-01 MED ORDER — ATORVASTATIN CALCIUM 80 MG PO TABS
80.0000 mg | ORAL_TABLET | Freq: Every day | ORAL | Status: DC
  Administered 2014-06-01 – 2014-06-02 (×2): 80 mg via ORAL
  Filled 2014-06-01 (×3): qty 1

## 2014-06-01 MED ORDER — TBO-FILGRASTIM 480 MCG/0.8ML ~~LOC~~ SOSY
480.0000 ug | PREFILLED_SYRINGE | Freq: Once | SUBCUTANEOUS | Status: AC
  Administered 2014-06-01: 480 ug via SUBCUTANEOUS
  Filled 2014-06-01: qty 0.8

## 2014-06-01 MED ORDER — SODIUM CHLORIDE 0.9 % IV BOLUS (SEPSIS)
1000.0000 mL | Freq: Once | INTRAVENOUS | Status: AC
  Administered 2014-06-01: 1000 mL via INTRAVENOUS

## 2014-06-01 MED ORDER — ADULT MULTIVITAMIN W/MINERALS CH
1.0000 | ORAL_TABLET | Freq: Every day | ORAL | Status: DC
  Administered 2014-06-02 – 2014-06-03 (×2): 1 via ORAL
  Filled 2014-06-01 (×3): qty 1

## 2014-06-01 MED ORDER — POLYETHYLENE GLYCOL 3350 17 GM/SCOOP PO POWD
17.0000 g | Freq: Every day | ORAL | Status: DC | PRN
  Filled 2014-06-01: qty 255

## 2014-06-01 MED ORDER — IOHEXOL 300 MG/ML  SOLN
80.0000 mL | Freq: Once | INTRAMUSCULAR | Status: AC | PRN
  Administered 2014-06-01: 80 mL via INTRAVENOUS

## 2014-06-01 MED ORDER — CLOPIDOGREL BISULFATE 75 MG PO TABS
75.0000 mg | ORAL_TABLET | Freq: Every day | ORAL | Status: DC
  Administered 2014-06-01 – 2014-06-03 (×3): 75 mg via ORAL
  Filled 2014-06-01 (×3): qty 1

## 2014-06-01 MED ORDER — CLINDAMYCIN PHOSPHATE 600 MG/50ML IV SOLN
600.0000 mg | Freq: Once | INTRAVENOUS | Status: AC
  Administered 2014-06-01: 600 mg via INTRAVENOUS
  Filled 2014-06-01: qty 50

## 2014-06-01 MED ORDER — POLYETHYLENE GLYCOL 3350 17 G PO PACK
17.0000 g | PACK | Freq: Every day | ORAL | Status: DC | PRN
  Administered 2014-06-02: 17 g via ORAL
  Filled 2014-06-01: qty 1

## 2014-06-01 MED ORDER — ACETAMINOPHEN 650 MG RE SUPP: 650.0000 mg | Freq: Four times a day (QID) | RECTAL | Status: DC | PRN

## 2014-06-01 MED ORDER — PANTOPRAZOLE SODIUM 40 MG PO TBEC
40.0000 mg | DELAYED_RELEASE_TABLET | Freq: Every day | ORAL | Status: DC
  Administered 2014-06-01 – 2014-06-03 (×3): 40 mg via ORAL
  Filled 2014-06-01 (×3): qty 1

## 2014-06-01 MED ORDER — ALBUTEROL SULFATE (2.5 MG/3ML) 0.083% IN NEBU: 3.0000 mL | INHALATION_SOLUTION | RESPIRATORY_TRACT | Status: DC | PRN

## 2014-06-01 MED ORDER — TRAZODONE HCL 100 MG PO TABS
100.0000 mg | ORAL_TABLET | Freq: Every day | ORAL | Status: DC
  Administered 2014-06-01 – 2014-06-02 (×2): 100 mg via ORAL
  Filled 2014-06-01 (×3): qty 1

## 2014-06-01 MED ORDER — POTASSIUM CHLORIDE IN NACL 20-0.9 MEQ/L-% IV SOLN
INTRAVENOUS | Status: DC
  Administered 2014-06-01 – 2014-06-02 (×3): via INTRAVENOUS
  Filled 2014-06-01 (×3): qty 1000

## 2014-06-01 MED ORDER — ACETAMINOPHEN 325 MG PO TABS
650.0000 mg | ORAL_TABLET | Freq: Four times a day (QID) | ORAL | Status: DC | PRN
Start: 2014-06-01 — End: 2014-06-03

## 2014-06-01 MED ORDER — HEPARIN SODIUM (PORCINE) 5000 UNIT/ML IJ SOLN
5000.0000 [IU] | Freq: Three times a day (TID) | INTRAMUSCULAR | Status: DC
  Administered 2014-06-01 – 2014-06-03 (×5): 5000 [IU] via SUBCUTANEOUS
  Filled 2014-06-01 (×8): qty 1

## 2014-06-01 MED ORDER — HYDROXYZINE HCL 25 MG PO TABS: 25.0000 mg | ORAL_TABLET | Freq: Every evening | ORAL | Status: DC | PRN

## 2014-06-01 MED ORDER — ALUM & MAG HYDROXIDE-SIMETH 200-200-20 MG/5ML PO SUSP: 30.0000 mL | Freq: Four times a day (QID) | ORAL | Status: DC | PRN

## 2014-06-01 NOTE — Progress Notes (Signed)
Patient arrived to unit at around 1700. Alert and orientedx4. Pleasant. Kept comfortable in bed. Oriented to room. Rn to continue to monitor.

## 2014-06-01 NOTE — H&P (Signed)
History and Physical:    Morgan Bean JYN:[id number] DOB: 07/27/1965 DOA: 06/01/2014  Referring physician: Varney Biles, MD PCP: [name], MD   Chief Complaint: Dysphagia  History of Present Illness:   Morgan Bean is an 49 y.o. female with a very detailed and complicated PMH including lymphadenopathy and neutropenia of unclear etiology, under the care of Dr. Alvy Bimler for ongoing evaluation of this, CAD, hypertension, polysubstance abuse, leukocytoclastic vasculitis, pulmonary hypertension, asthma, chronic diastolic CHF who presents with a 3 day history of solid and liquid dysphagia and pharyngitis, chokes with any attempt to swallow.  She has had a cough productive of clear sputum. Reports fatigue but no other constitutional symptoms such as fever, weight loss, or myalgias. She also reports some tender cervical lymphadenopathy. Upon initial evaluation, plain neck films showed thickening of the epiglottis and area epiglottic fold on the right with a nodular appearance. Follow-up CT scan of the neck showed asymmetric enlargement of the area epiglottic fold on the right concerning for malignancy versus inflammation with recommendations of direct visualization.  She has seen Dr. Erik Obey in the past, for evaluation of lymphadenopathy, status post biopsy of a lymph node---evidently did not show anything per the patient's report, but says she continued to see him for "reflux".  It does appear that she had a CT of the neck a year ago, which showed some edema of the oropharynx, hypopharynx and larynx at that time.  I could not find any pathology results from this biopsy.  ROS:   Constitutional: No fever, no chills;  Appetite normal; No weight loss, no weight gain, + fatigue.  HEENT: No blurry vision, no diplopia, + pharyngitis, + dysphagia CV: No chest pain, no palpitations, no PND, no orthopnea, no edema.  Resp: No SOB, + cough, no pleuritic pain. GI: No nausea, no vomiting, no diarrhea, no  melena, no hematochezia, no constipation, no abdominal pain.  GU: No dysuria, no hematuria, no frequency, no urgency. MSK: no myalgias, no arthralgias.  Neuro:  + headache, no focal neurological deficits, no history of seizures.  Psych: No depression, no anxiety.  Endo: No heat intolerance, no cold intolerance, no polyuria, no polydipsia  Skin: No rashes, no skin lesions.  Heme: No easy bruising.  Travel history: No recent travel.   Past Medical History:   Past Medical History  Diagnosis Date  . Coronary artery disease     Lexiscan myoview (10/12) with significant ST depression upon Lexiscan injection but no ischemia or infarction on perfusion images. Left heart cath (10/12): 30% mid LAD, 30% ostial D1, 50% ostial D2.    [name] Hypertension   . Tricuspid regurgitation   . Iron deficiency     hx of  . Polysubstance abuse     Prior cocaine  . Tobacco abuse   . Leukocytoclastic vasculitis     Rash across lower body, occurred in 9/11, diagnosed by skin biopsy. ANA positive. Thought to be secondary to cocaine use.  . Pulmonary HTN     Echo (9/11) with EF 65%, mild LVH, mild AI, mild MR, moderate to possibly severe TR with PA systolic pressure 52 mmHg. Echo (10/12): severe LV hypertrophy, EF 60-65%, mild MR, mild AI, moderate to severe tricuspid regurgitation, PA systolic pressure 38 mmHg.  Echo 4/14: moderate LVH, EF 65%, normal wall motion, diastolic dysfunction, mild AI, mild MR  . Asthma   . Shortness of breath     a. PFTs 5/14:  FEV1/FVC 99% predicted; FVC 60% predicted; DLCO mildly reduced,  mild restriction, air trapping.;  b.  seen by pulmo (Dr. Clance) 5/14: mainly upper airway symptoms - ACE d/c'd (sx's better)  . Chronic diastolic CHF (congestive heart failure)   . Constipation   . Lymphadenopathy 03/21/2013  . Leukopenia 03/21/2013  . Unspecified deficiency anemia 03/29/2013  . Anginal pain     none in past year  . Anxiety   . Mental disorder   . Depression   . Stroke 2010ish  .  Arthritis   . Pancytopenia   . Hyperlipemia   . Syphilis 04/25/2013  . Candidiasis 06/23/2013  . Anemia of other chronic disease 11/13/2013  . Cough 11/13/2013  . Lymphadenopathy 01/30/2014  . Headache 02/22/2014  . Hx of cardiovascular stress test     Lexiscan Myoview (2/16): Breast attenuation artifact, no ischemia, EF 64%; low risk    Past Surgical History:   Past Surgical History  Procedure Laterality Date  . Ankle surgery      right  . Cardiac catheterization    . I&d extremity  08/09/2011    Procedure: IRRIGATION AND DEBRIDEMENT EXTREMITY;  Surgeon: Paul S Toth III, MD;  Location: [facility] OR;  Service: General;  Laterality: Left;  i & D Left axilla abscess  . Lymph node biopsy N/A 04/05/2013    Procedure: EXCISIONAL BIOPSY RIGHT SUBMANDIBULAR NODE, NASAL ENDOSCOPIC WITH BIOPSY NASAL PHARYNX;  Surgeon: Karol Wolicki, MD;  Location: [facility] OR;  Service: ENT;  Laterality: N/A;    Social History:   History   Social History  . Marital Status: Single    Spouse Name: N/A  . Number of Children: 2  . Years of Education: N/A   Occupational History  . unemployed    Social History Main Topics  . Smoking status: Former Smoker -- 0.25 packs/day for 30 years    Types: Cigarettes    Quit date: 05/23/2013  . Smokeless tobacco: Never Used  . Alcohol Use: No     Comment: pint of wine 2-3 times a week--ocassional  . Drug Use: 1.00 per week    Special: "Crack" cocaine     Comment: none since 02/04/13  . Sexual Activity: Yes   Other Topics Concern  . Not on file   Social History Narrative   Lives with mom, sisters and brothers.    Family history:   Family History  Problem Relation Age of Onset  . Coronary artery disease Mother   . Diabetes Mother   . Breast cancer Maternal Grandmother   . Diabetes Maternal Grandmother   . Diabetes Father   . Hypertension Father   . Coronary artery disease Father   . Heart attack Maternal Grandmother   . Stroke Maternal Grandmother   . Heart  attack Father     Allergies   Ciprofloxacin  Current Medications:   Prior to Admission medications   Medication Sig Start Date End Date Taking? Authorizing Provider  albuterol (PROVENTIL HFA;VENTOLIN HFA) 108 (90 BASE) MCG/ACT inhaler Inhale 2 puffs into the lungs every 2 (two) hours as needed for wheezing or shortness of breath (cough). 05/25/14  Yes Olugbemiga E [name], MD  amLODipine (NORVASC) 10 MG tablet TAKE ONE TABLET DAILY 09/11/13  Yes Dalton S McLean, MD  atorvastatin (LIPITOR) 80 MG tablet Take 1 tablet (80 mg total) by mouth daily. 11/14/13  Yes Dalton S McLean, MD  buPROPion (WELLBUTRIN SR) 150 MG 12 hr tablet Take 150 mg by mouth 2 (two) times daily.   Yes Historical Provider, MD  citalopram (CELEXA) 40 MG   tablet Take 1 tablet (40 mg total) by mouth daily. 08/25/13  Yes Tresa Garter, MD  cloNIDine (CATAPRES) 0.2 MG tablet Take 1 tablet (0.2 mg total) by mouth 2 (two) times daily. 04/25/14  Yes Liliane Shi, PA-C  clopidogrel (PLAVIX) 75 MG tablet Take 1 tablet (75 mg total) by mouth daily. 05/14/14  Yes Cameron Sprang, MD  ferrous sulfate 325 (65 FE) MG tablet Take 650 mg by mouth daily with breakfast.   Yes Historical Provider, MD  furosemide (LASIX) 40 MG tablet TAKE 1/2 TABLET BY MOUTH TWICE DAILY 05/17/14  Yes [name], MD  hydrOXYzine (ATARAX/VISTARIL) 25 MG tablet Take 25 mg by mouth at bedtime as needed (sleep).   Yes Historical Provider, MD  loratadine (CLARITIN) 10 MG tablet Take 1 tablet (10 mg total) by mouth daily. 09/20/13  Yes Lance Bosch, NP  Multiple Vitamin (MULTIVITAMIN WITH MINERALS) TABS Take 1 tablet by mouth daily. 11/18/11  Yes Milana Huntsman Readling, MD  omeprazole (PRILOSEC) 40 MG capsule Take 40 mg by mouth daily.   Yes Historical Provider, MD  polyethylene glycol powder (GLYCOLAX/MIRALAX) powder Take 17 g by mouth daily as needed (constipation).    Yes Historical Provider, MD  potassium chloride (K-DUR,KLOR-CON) 10 MEQ tablet Take 1 tablet (10 mEq  total) by mouth 2 (two) times daily with a meal. 10/06/13  Yes [name], MD  traZODone (DESYREL) 100 MG tablet Take 1 tablet (100 mg total) by mouth at bedtime. 08/25/13  Yes Tresa Garter, MD  butalbital-acetaminophen-caffeine (FIORICET) 50-325-40 MG per tablet Take 1 tablet by mouth every 6 (six) hours as needed for headache. Patient not taking: Reported on 06/01/2014 02/22/14 02/22/15  Heath Lark, MD    Physical Exam:   Filed Vitals:   06/01/14 1136 06/01/14 1351 06/01/14 1622 06/01/14 1700  BP: 116/82 127/84 125/86 130/86  Pulse: 89 93 87 86  Temp: 98.6 F (37 C) 98.5 F (36.9 C) 98.2 F (36.8 C) 98.4 F (36.9 C)  TempSrc: Oral Oral Oral Oral  Resp: _0 SpO2: 100% 100% 100% 100%     Physical Exam: Blood pressure 130/86, pulse 86, temperature 98.4 F (36.9 C), temperature source Oral, resp. rate 20, last menstrual period 04/24/2013, SpO2 100 %. Gen: No acute distress. Head: Normocephalic, atraumatic. Eyes: PERRL, EOMI, sclerae nonicteric. Mouth: Oropharynx shows posterior pharyngitis with exudate. Neck: Supple, no thyromegaly, + cervical lymphadenopathy, no jugular venous distention. Chest: Lungs are CTAB. CV: Heart sounds are regular with no murmurs, rubs, or gallops. Abdomen: Soft, nontender, nondistended with normal active bowel sounds. Extremities: Extremities are without clubbing, edema, or cyanosis. Skin: Warm and dry. Neuro: Alert and oriented times 3; cranial nerves II through XII grossly intact. Psych: Mood and affect normal.   Data Review:    Labs: Basic Metabolic Panel:  Recent Labs Lab 06/01/14 0934  NA 137  K 3.6  CL 109  CO2 22  GLUCOSE 117*  BUN 9  CREATININE 0.95  CALCIUM 8.8   CBC:  Recent Labs Lab 06/01/14 0934  WBC 1.9*  NEUTROABS 0.0*  HGB 10.4*  HCT 33.3*  MCV 78.0  PLT 183    BNP (last 3 results)  Recent Labs  06/02/13 1800 08/06/13 1843 04/25/14 1048  PROBNP 500.8* 1186.0* 43.0   CBG: No results  for input(s): GLUCAP in the last 168 hours.  Radiographic Studies: Dg Neck Soft Tissue  06/01/2014   CLINICAL DATA:  Sore throat.  EXAM: NECK SOFT TISSUES -  1+ VIEW  COMPARISON:  CT neck 02/02/2014  FINDINGS: Mild enlargement of the epiglottis which has developed since prior study. Aryepiglottic fold thickening on the right may also have progressed since the prior CT. Prevertebral soft tissues are normal. No acute bony abnormality.  IMPRESSION: Thickening of epiglottis and aryepiglottic fold on the right with a nodular appearance. This could be due to infection such is epiglottitis however given the presence of nodular thickening of the right epiglottic area epiglottic fold previously, this could also represent neoplasm. Consider followup CT neck with contrast.   Electronically Signed   By: Franchot Gallo M.D.   On: 06/01/2014 11:10   Ct Soft Tissue Neck W Contrast  06/01/2014   CLINICAL DATA:  Dysphasia. Difficulty swallowing. Pancytopenia. History of lymphadenopathy  EXAM: CT NECK WITH CONTRAST  TECHNIQUE: Multidetector CT imaging of the neck was performed using the standard protocol following the bolus administration of intravenous contrast.  CONTRAST:  62m OMNIPAQUE IOHEXOL 300 MG/ML  SOLN  COMPARISON:  CT neck 02/02/2014  FINDINGS: Pharynx and larynx: Symmetric hypertrophy of the palatine tonsils similar to the prior study. Adenoid tissue also is prominent and unchanged.  Asymmetric enlargement of the aryepiglottic fold on the right appears more prominent than on the prior study. Vocal cords are normal. Epiglottis is not enlarged.  Salivary glands: Parotid and submandibular glands are normal in size and symmetric.  Thyroid: Normal  Lymph nodes: Scattered lymph nodes are mildly enlarged throughout the neck. Right level 1 submental node measures 8.7 mm in short axis similar to the prior study. Small left submental nodes unchanged. Right level 2 nodes measure 11 mm and 12 mm unchanged from the prior study.  Subcentimeter posterior level 5 nodes on the right unchanged.  Left level 2 node measures 9 mm in short axis unchanged from the prior study. Posterior level 5 nodes on the left measures 7 mm and 12 mm similar to the prior study. No new adenopathy.  Vascular: Negative  Limited intracranial: Negative  Visualized orbits: Negative  Mastoids and visualized paranasal sinuses: Negative  Skeleton: Disc degeneration and spondylosis especially at C5-6. No bone lesions.  Upper chest: Negative  IMPRESSION: Diffusely prominent lymph tissue in Waldeyer's ring as noted on the prior study.  Mild cervical adenopathy stable from the prior study. Question history of lymphoma.  Asymmetric enlargement aryepiglottic fold of the right slightly more prominent than the prior CT and may be the cause of patient's dysphasia. This could be due to tumor such as lymphoma, carcinoma or inflammation. Direct visualization may be helpful .   Electronically Signed   By: CFranchot GalloM.D.   On: 06/01/2014 14:14    EKG: Independently reviewed. Sinus rhythm; Atrial premature complexes; Borderline T abnormalities, diffuse leads.  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 BRedmond Basemanof ENT for direct visualization to help clarify. - Given neutropenia, we'll continue to treat with empiric clindamycin. - CT scan findings worrisome for underlying malignancy, although she has had a biopsy of a cervical lymph node in the past which was unrevealing.  Active Problems:   Depression - Continue Wellbutrin and Celexa.    CAD - Continue Plavix, trazodone and Lipitor.    Neutropenia - Has seen Dr. GAlvy Bimlerwith evaluation including a bone marrow biopsy. - She has not responded to colony stimulating factors in the past. - She has responded to prednisone. She also has been given IVIG. -  At this point, Dr. Gorsuch feels that her neutropenia is  related to cocaine abuse. We'll check a urine drug screen. - Last seen by Dr. Gorsuch on 05/22/14. Spoke with Dr. Ennever and he does recommend giving Neupogen which I have started. - Save smear for review.    Anemia in chronic illness - Hold iron therapy for now.      Hypertension - Continue Norvasc and Clonidine.    DVT prophylaxis - SCDs.  Code Status: Full. Family Communication: Natasha (daughter) is emergency contact. Disposition Plan: Home when stable.  Time spent: 90 minutes.  RAMA,CHRISTINA [facility] Pager [phone] Cell: [phone]   If 7PM-7AM, please contact night-coverage [email or web address] Password [email or web address] 06/01/2014, 5:51 PM    

## 2014-06-01 NOTE — ED Provider Notes (Signed)
CSN: [id number]     Arrival date & time 06/01/14  4944 History   First MD Initiated Contact with Patient 06/01/14 [phone]     Chief Complaint  Patient presents with  . Dysphagia  . Chest Pain     (Consider location/radiation/quality/duration/timing/severity/associated sxs/prior Treatment) HPI Comments: Pt comes in with cc of sore throat, and difficulty swallowng and neck swelling. Symptoms started 3 days ago, and started with swelling, followed by pain and subsequently swallowing problems. Pt has a cough as well. Pt describes her pain with swallowing - but it doesn't feel like sore throat pain. Pt has thick, yellow mucus. Pt has trouble swallowing liquid and solid -  And it feels to her as if the food gets stuck. There is no associated fevers, chills. No other uri like sx and no body aches. + prior hx of similar episode, about a year ago. Trouble with solid and liquids when it comes to swallowing, no drooling.  PMHx: neutropenia and thrombocytopenia, possibly from cocaine use. CHF.   ROS 10 Systems reviewed and are negative for acute change except as noted in the HPI.     Patient is a 49 y.o. female presenting with chest pain. The history is provided by the patient.  Chest Pain Associated symptoms: dysphagia   Associated symptoms: no abdominal pain, no headache, no nausea, no shortness of breath and not vomiting     Past Medical History  Diagnosis Date  . Coronary artery disease     Lexiscan myoview (10/12) with significant ST depression upon Lexiscan injection but no ischemia or infarction on perfusion images. Left heart cath (10/12): 30% mid LAD, 30% ostial D1, 50% ostial D2.    [name] Hypertension   . Tricuspid regurgitation   . Iron deficiency     hx of  . Polysubstance abuse     Prior cocaine  . Tobacco abuse   . Leukocytoclastic vasculitis     Rash across lower body, occurred in 9/11, diagnosed by skin biopsy. ANA positive. Thought to be secondary to cocaine use.  . Pulmonary  HTN     Echo (9/11) with EF 65%, mild LVH, mild AI, mild MR, moderate to possibly severe TR with PA systolic pressure 52 mmHg. Echo (10/12): severe LV hypertrophy, EF 60-65%, mild MR, mild AI, moderate to severe tricuspid regurgitation, PA systolic pressure 38 mmHg.  Echo 4/14: moderate LVH, EF 65%, normal wall motion, diastolic dysfunction, mild AI, mild MR  . Asthma   . Shortness of breath     a. PFTs 5/14:  FEV1/FVC 99% predicted; FVC 60% predicted; DLCO mildly reduced, mild restriction, air trapping.;  b.  seen by pulmo (Dr. [name]) 5/14: mainly upper airway symptoms - ACE d/c'd (sx's better)  . Chronic diastolic CHF (congestive heart failure)   . Constipation   . Lymphadenopathy 03/21/2013  . Leukopenia 03/21/2013  . Unspecified deficiency anemia 03/29/2013  . Anginal pain     none in past year  . Anxiety   . Mental disorder   . Depression   . Stroke 2010ish  . Arthritis   . Pancytopenia   . Hyperlipemia   . Syphilis 04/25/2013  . Candidiasis 06/23/2013  . Anemia of other chronic disease 11/13/2013  . Cough 11/13/2013  . Lymphadenopathy 01/30/2014  . Headache 02/22/2014  . Hx of cardiovascular stress test     Lexiscan Myoview (2/16): Breast attenuation artifact, no ischemia, EF 64%; low risk   Past Surgical History  Procedure Laterality Date  . Ankle surgery  right  . Cardiac catheterization    . I&d extremity  08/09/2011    Procedure: IRRIGATION AND DEBRIDEMENT EXTREMITY;  Surgeon: [name], MD;  Location: [place];  Service: General;  Laterality: Left;  i & D Left axilla abscess  . Lymph node biopsy N/A 04/05/2013    Procedure: EXCISIONAL BIOPSY RIGHT SUBMANDIBULAR NODE, NASAL ENDOSCOPIC WITH BIOPSY NASAL PHARYNX;  Surgeon: [name], MD;  Location: [facility] OR;  Service: ENT;  Laterality: N/A;   Family History  Problem Relation Age of Onset  . Coronary artery disease Mother   . Diabetes Mother   . Breast cancer Maternal Grandmother   . Diabetes Maternal Grandmother   .  Diabetes Father   . Hypertension Father   . Coronary artery disease Father   . Heart attack Maternal Grandmother   . Stroke Maternal Grandmother   . Heart attack Father    History  Substance Use Topics  . Smoking status: Former Smoker -- 0.25 packs/day for 30 years    Types: Cigarettes    Quit date: 05/23/2013  . Smokeless tobacco: Never Used  . Alcohol Use: No     Comment: pint of wine 2-3 times a week--ocassional   OB History    Gravida Para Term Preterm AB TAB SAB Ectopic Multiple Living   4 2 2  2 2    2      Review of Systems  Constitutional: Positive for activity change.  HENT: Positive for dental problem and trouble swallowing. Negative for drooling.   Respiratory: Negative for shortness of breath.   Cardiovascular: Positive for chest pain.  Gastrointestinal: Negative for nausea, vomiting and abdominal pain.  Genitourinary: Negative for dysuria.  Musculoskeletal: Negative for neck pain.  Neurological: Negative for headaches.  All other systems reviewed and are negative.     Allergies  Ciprofloxacin  Home Medications   Prior to Admission medications   Medication Sig Start Date End Date Taking? Authorizing Provider  albuterol (PROVENTIL HFA;VENTOLIN HFA) 108 (90 BASE) MCG/ACT inhaler Inhale 2 puffs into the lungs every 2 (two) hours as needed for wheezing or shortness of breath (cough). 05/25/14  Yes Tresa Garter, MD  amLODipine (NORVASC) 10 MG tablet TAKE ONE TABLET DAILY 09/11/13  Yes [name], MD  atorvastatin (LIPITOR) 80 MG tablet Take 1 tablet (80 mg total) by mouth daily. 11/14/13  Yes [name], MD  buPROPion [facility] SR) 150 MG 12 hr tablet Take 150 mg by mouth 2 (two) times daily.   Yes Historical Provider, MD  citalopram (CELEXA) 40 MG tablet Take 1 tablet (40 mg total) by mouth daily. 08/25/13  Yes Tresa Garter, MD  cloNIDine (CATAPRES) 0.2 MG tablet Take 1 tablet (0.2 mg total) by mouth 2 (two) times daily. 04/25/14  Yes Liliane Shi, PA-C  clopidogrel (PLAVIX) 75 MG tablet Take 1 tablet (75 mg total) by mouth daily. 05/14/14  Yes Cameron Sprang, MD  ferrous sulfate 325 (65 FE) MG tablet Take 650 mg by mouth daily with breakfast.   Yes Historical Provider, MD  furosemide (LASIX) 40 MG tablet TAKE 1/2 TABLET BY MOUTH TWICE DAILY 05/17/14  Yes [name], MD  hydrOXYzine (ATARAX/VISTARIL) 25 MG tablet Take 25 mg by mouth at bedtime as needed (sleep).   Yes Historical Provider, MD  loratadine (CLARITIN) 10 MG tablet Take 1 tablet (10 mg total) by mouth daily. 09/20/13  Yes Lance Bosch, NP  Multiple Vitamin (MULTIVITAMIN WITH MINERALS) TABS Take 1  tablet by mouth daily. 11/18/11  Yes Milana Huntsman Readling, MD  omeprazole (PRILOSEC) 40 MG capsule Take 40 mg by mouth daily.   Yes Historical Provider, MD  polyethylene glycol powder (GLYCOLAX/MIRALAX) powder Take 17 g by mouth daily as needed (constipation).    Yes Historical Provider, MD  potassium chloride (K-DUR,KLOR-CON) 10 MEQ tablet Take 1 tablet (10 mEq total) by mouth 2 (two) times daily with a meal. 10/06/13  Yes [name], MD  traZODone (DESYREL) 100 MG tablet Take 1 tablet (100 mg total) by mouth at bedtime. 08/25/13  Yes Tresa Garter, MD  butalbital-acetaminophen-caffeine (FIORICET) 50-325-40 MG per tablet Take 1 tablet by mouth every 6 (six) hours as needed for headache. Patient not taking: Reported on 06/01/2014 02/22/14 02/22/15  Heath Lark, MD   BP 127/84 mmHg  Pulse 93  Temp(Src) 98.5 F (36.9 C) (Oral)  Resp 18  SpO2 100%  LMP 04/24/2013 Physical Exam  Constitutional: She is oriented to person, place, and time. She appears well-developed and well-nourished.  HENT:  Head: Normocephalic and atraumatic.  Right sided tonsillar enlargement, with exudate.  Eyes: EOM are normal. Pupils are equal, round, and reactive to light.  Neck: Neck supple.  Cardiovascular: Normal rate, regular rhythm and normal heart sounds.   No murmur heard. Pulmonary/Chest:  Effort normal. No respiratory distress.  Abdominal: Soft. She exhibits no distension. There is no tenderness. There is no rebound and no guarding.  Lymphadenopathy:    She has cervical adenopathy.  Neurological: She is alert and oriented to person, place, and time.  Skin: Skin is warm and dry.  Nursing note and vitals reviewed.   ED Course  Procedures (including critical care time) Labs Review Labs Reviewed  CBC - Abnormal; Notable for the following:    WBC 1.9 (*)    Hemoglobin 10.4 (*)    HCT 33.3 (*)    MCH 24.4 (*)    RDW 17.0 (*)    All other components within normal limits  BASIC METABOLIC PANEL - Abnormal; Notable for the following:    Glucose, Bld 117 (*)    GFR calc non Af Amer 69 (*)    GFR calc Af Amer 80 (*)    All other components within normal limits  DIFFERENTIAL - Abnormal; Notable for the following:    Neutrophils Relative % 0 (*)    Lymphocytes Relative 53 (*)    Monocytes Relative 45 (*)    Neutro Abs 0.0 (*)    All other components within normal limits  RAPID STREP SCREEN  PROTIME-INR  PATHOLOGIST SMEAR REVIEW  Randolm Idol, ED    Imaging Review Dg Neck Soft Tissue  06/01/2014   CLINICAL DATA:  Sore throat.  EXAM: NECK SOFT TISSUES - 1+ VIEW  COMPARISON:  CT neck 02/02/2014  FINDINGS: Mild enlargement of the epiglottis which has developed since prior study. Aryepiglottic fold thickening on the right may also have progressed since the prior CT. Prevertebral soft tissues are normal. No acute bony abnormality.  IMPRESSION: Thickening of epiglottis and aryepiglottic fold on the right with a nodular appearance. This could be due to infection such is epiglottitis however given the presence of nodular thickening of the right epiglottic area epiglottic fold previously, this could also represent neoplasm. Consider followup CT neck with contrast.   Electronically Signed   By: Franchot Gallo M.D.   On: 06/01/2014 11:10   Ct Soft Tissue Neck W  Contrast  06/01/2014   CLINICAL DATA:  Dysphasia. Difficulty swallowing.  Pancytopenia. History of lymphadenopathy  EXAM: CT NECK WITH CONTRAST  TECHNIQUE: Multidetector CT imaging of the neck was performed using the standard protocol following the bolus administration of intravenous contrast.  CONTRAST:  36mL OMNIPAQUE IOHEXOL 300 MG/ML  SOLN  COMPARISON:  CT neck 02/02/2014  FINDINGS: Pharynx and larynx: Symmetric hypertrophy of the palatine tonsils similar to the prior study. Adenoid tissue also is prominent and unchanged.  Asymmetric enlargement of the aryepiglottic fold on the right appears more prominent than on the prior study. Vocal cords are normal. Epiglottis is not enlarged.  Salivary glands: Parotid and submandibular glands are normal in size and symmetric.  Thyroid: Normal  Lymph nodes: Scattered lymph nodes are mildly enlarged throughout the neck. Right level 1 submental node measures 8.7 mm in short axis similar to the prior study. Small left submental nodes unchanged. Right level 2 nodes measure 11 mm and 12 mm unchanged from the prior study. Subcentimeter posterior level 5 nodes on the right unchanged.  Left level 2 node measures 9 mm in short axis unchanged from the prior study. Posterior level 5 nodes on the left measures 7 mm and 12 mm similar to the prior study. No new adenopathy.  Vascular: Negative  Limited intracranial: Negative  Visualized orbits: Negative  Mastoids and visualized paranasal sinuses: Negative  Skeleton: Disc degeneration and spondylosis especially at C5-6. No bone lesions.  Upper chest: Negative  IMPRESSION: Diffusely prominent lymph tissue in Waldeyer's ring as noted on the prior study.  Mild cervical adenopathy stable from the prior study. Question history of lymphoma.  Asymmetric enlargement aryepiglottic fold of the right slightly more prominent than the prior CT and may be the cause of patient's dysphasia. This could be due to tumor such as lymphoma, carcinoma or  inflammation. Direct visualization may be helpful .   Electronically Signed   By: Franchot Gallo M.D.   On: 06/01/2014 14:14     EKG Interpretation   Date/Time:  Friday June 01 2014 09:34:45 EST Ventricular Rate:  91 PR Interval:  146 QRS Duration: 84 QT Interval:  360 QTC Calculation: 443 R Axis:   33 Text Interpretation:  Sinus rhythm Atrial premature complexes Borderline T  abnormalities, diffuse leads Confirmed by Kathrynn Humble, MD, Thelma Comp [phone]) on  06/01/2014 10:25:29 AM      MDM   Final diagnoses:  Mass in neck  Neutropenia  Abscess of neck    Neutropenic patient comes in with cc of difficulty in swallowing. She has thrombocytopnia and neutropenia of unknown etiology.  She has hx of cocaine use and smoking cigarette. Exam shows tonsillar exudate. No trismus or drooling appreciated, but pt has dysphagia, and odynophagia - so with her neutropenia, we got a soft tissue neck Xray to r/o deep infection of the throat, which would have mandated admission. Xrays recommended CT scan for better morphology - and the Ct results are as above.  Spoke with Dr. Melene Plan, ENT. Concerns are for possible cancer vs. Infection for the throat mass. Dr. Melene Plan to see the patient in the evening. Pt will get iv clinda. She has received some meds for pain. Dr. Rockne Menghini, medicine to admit. No airway compromise right now, med surg bed, with neutropenic precautions requested.     Varney Biles, MD 06/01/14 1615

## 2014-06-01 NOTE — Consult Note (Signed)
Reason for Consult:Throat pain, dysphagia Referring Physician: Rama  Morgan Bean is an 49 y.o. female.  HPI: 49 year old female previously seen by Dr. Lazarus Salines for a lymph node excision and throat symptoms treated for reflux.  Her last appointment was about one year ago.  She started with a swollen feeling in her neck about three days ago and noticed soreness in the throat with trouble swallowing the next day.  This morning, she was unable to swallow her medicines so she came to the ER.  Her voice was hoarse but she had no breathing trouble.  In the ER, a neck x-ray suggested a thickened epiglottis and a neck CT demonstrated soft tissue thickening of the right arytenoid area.  In the ER, she was treated with clindamycin and she was admitted.  This evening, she feels less throat pain and her swallow has improved.  Hoarseness has largely resolved.  Past Medical History  Diagnosis Date  . Coronary artery disease     Lexiscan myoview (10/12) with significant ST depression upon Lexiscan injection but no ischemia or infarction on perfusion images. Left heart cath (10/12): 30% mid LAD, 30% ostial D1, 50% ostial D2.    [name] Hypertension   . Tricuspid regurgitation   . Iron deficiency     hx of  . Polysubstance abuse     Prior cocaine  . Tobacco abuse   . Leukocytoclastic vasculitis     Rash across lower body, occurred in 9/11, diagnosed by skin biopsy. ANA positive. Thought to be secondary to cocaine use.  . Pulmonary HTN     Echo (9/11) with EF 65%, mild LVH, mild AI, mild MR, moderate to possibly severe TR with PA systolic pressure 52 mmHg. Echo (10/12): severe LV hypertrophy, EF 60-65%, mild MR, mild AI, moderate to severe tricuspid regurgitation, PA systolic pressure 38 mmHg.  Echo 4/14: moderate LVH, EF 65%, normal wall motion, diastolic dysfunction, mild AI, mild MR  . Asthma   . Shortness of breath     a. PFTs 5/14:  FEV1/FVC 99% predicted; FVC 60% predicted; DLCO mildly reduced, mild restriction,  air trapping.;  b.  seen by pulmo (Dr. Shelle Iron) 5/14: mainly upper airway symptoms - ACE d/c'd (sx's better)  . Chronic diastolic CHF (congestive heart failure)   . Constipation   . Lymphadenopathy 03/21/2013  . Leukopenia 03/21/2013  . Unspecified deficiency anemia 03/29/2013  . Anginal pain     none in past year  . Anxiety   . Mental disorder   . Depression   . Stroke 2010ish  . Arthritis   . Pancytopenia   . Hyperlipemia   . Syphilis 04/25/2013  . Candidiasis 06/23/2013  . Anemia of other chronic disease 11/13/2013  . Cough 11/13/2013  . Lymphadenopathy 01/30/2014  . Headache 02/22/2014  . Hx of cardiovascular stress test     Lexiscan Myoview (2/16): Breast attenuation artifact, no ischemia, EF 64%; low risk    Past Surgical History  Procedure Laterality Date  . Ankle surgery      right  . Cardiac catheterization    . I&d extremity  08/09/2011    Procedure: IRRIGATION AND DEBRIDEMENT EXTREMITY;  Surgeon: Robyne Askew, MD;  Location: [facility] OR;  Service: General;  Laterality: Left;  i & D Left axilla abscess  . Lymph node biopsy N/A 04/05/2013    Procedure: EXCISIONAL BIOPSY RIGHT SUBMANDIBULAR NODE, NASAL ENDOSCOPIC WITH BIOPSY NASAL PHARYNX;  Surgeon: Flo Shanks, MD;  Location: [facility] OR;  Service: ENT;  Laterality: N/A;    Family History  Problem Relation Age of Onset  . Coronary artery disease Mother   . Diabetes Mother   . Breast cancer Maternal Grandmother   . Diabetes Maternal Grandmother   . Diabetes Father   . Hypertension Father   . Coronary artery disease Father   . Heart attack Maternal Grandmother   . Stroke Maternal Grandmother   . Heart attack Father     Social History:  reports that she quit smoking about a year ago. Her smoking use included Cigarettes. She has a 7.5 pack-year smoking history. She has never used smokeless tobacco. She reports that she uses illicit drugs ("Crack" cocaine) about once per week. She reports that she does not drink  alcohol.  Allergies:  Allergies  Allergen Reactions  . Ciprofloxacin Itching    Medications: I have reviewed the patient's current medications.  Results for orders placed or performed during the hospital encounter of 06/01/14 (from the past 48 hour(s))  CBC     Status: Abnormal   Collection Time: 06/01/14  9:34 AM  Result Value Ref Range   WBC 1.9 (L) 4.0 - 10.5 K/uL   RBC 4.27 3.87 - 5.11 MIL/uL   Hemoglobin 10.4 (L) 12.0 - 15.0 g/dL   HCT 33.3 (L) 36.0 - 46.0 %   MCV 78.0 78.0 - 100.0 fL   MCH 24.4 (L) 26.0 - 34.0 pg   MCHC 31.2 30.0 - 36.0 g/dL   RDW 17.0 (H) 11.5 - 15.5 %   Platelets 183 150 - 400 K/uL  Protime-INR (if pt is taking Coumadin)     Status: None   Collection Time: 06/01/14  9:34 AM  Result Value Ref Range   Prothrombin Time 14.1 11.6 - 15.2 seconds   INR 1.08 0.00 - 6.30  Basic metabolic panel     Status: Abnormal   Collection Time: 06/01/14  9:34 AM  Result Value Ref Range   Sodium 137 135 - 145 mmol/L   Potassium 3.6 3.5 - 5.1 mmol/L   Chloride 109 96 - 112 mmol/L   CO2 22 19 - 32 mmol/L   Glucose, Bld 117 (H) 70 - 99 mg/dL   BUN 9 6 - 23 mg/dL   Creatinine, Ser 0.95 0.50 - 1.10 mg/dL   Calcium 8.8 8.4 - 10.5 mg/dL   GFR calc non Af Amer 69 (L) >90 mL/min   GFR calc Af Amer 80 (L) >90 mL/min    Comment: (NOTE) The eGFR has been calculated using the CKD EPI equation. This calculation has not been validated in all clinical situations. eGFR's persistently <90 mL/min signify possible Chronic Kidney Disease.    Anion gap 6 5 - 15  Differential     Status: Abnormal   Collection Time: 06/01/14  9:34 AM  Result Value Ref Range   Neutrophils Relative % 0 (L) 43 - 77 %   Lymphocytes Relative 53 (H) 12 - 46 %   Monocytes Relative 45 (H) 3 - 12 %   Eosinophils Relative 1 0 - 5 %   Basophils Relative 1 0 - 1 %   Neutro Abs 0.0 (L) 1.7 - 7.7 K/uL   Lymphs Abs 1.0 0.7 - 4.0 K/uL   Monocytes Absolute 0.9 0.1 - 1.0 K/uL   Eosinophils Absolute 0.0 0.0 - 0.7  K/uL   Basophils Absolute 0.0 0.0 - 0.1 K/uL   WBC Morphology WHITE COUNT CONFIRMED ON SMEAR   Pathologist smear review     Status: None  Collection Time: 06/01/14  9:34 AM  Result Value Ref Range   Path Review Reviewed by Kalman Drape, M.D.     Comment: 03.11.16 MICROCYTIC ANEMIA, LEUKOPENIA, AND NEUTROPENIA.   I-stat troponin, ED (not at [facility])     Status: None   Collection Time: 06/01/14  9:53 AM  Result Value Ref Range   Troponin i, poc 0.00 0.00 - 0.08 ng/mL   Comment 3            Comment: Due to the release kinetics of cTnI, a negative result within the first hours of the onset of symptoms does not rule out myocardial infarction with certainty. If myocardial infarction is still suspected, repeat the test at appropriate intervals.   Rapid strep screen     Status: None   Collection Time: 06/01/14  3:04 PM  Result Value Ref Range   Streptococcus, Group A Screen (Direct) NEGATIVE NEGATIVE    Comment: (NOTE) A Rapid Antigen test may result negative if the antigen level in the sample is below the detection level of this test. The FDA has not cleared this test as a stand-alone test therefore the rapid antigen negative result has reflexed to a Group A Strep culture.     Dg Neck Soft Tissue  06/01/2014   CLINICAL DATA:  Sore throat.  EXAM: NECK SOFT TISSUES - 1+ VIEW  COMPARISON:  CT neck 02/02/2014  FINDINGS: Mild enlargement of the epiglottis which has developed since prior study. Aryepiglottic fold thickening on the right may also have progressed since the prior CT. Prevertebral soft tissues are normal. No acute bony abnormality.  IMPRESSION: Thickening of epiglottis and aryepiglottic fold on the right with a nodular appearance. This could be due to infection such is epiglottitis however given the presence of nodular thickening of the right epiglottic area epiglottic fold previously, this could also represent neoplasm. Consider followup CT neck with contrast.   Electronically  Signed   By: Franchot Gallo M.D.   On: 06/01/2014 11:10   Ct Soft Tissue Neck W Contrast  06/01/2014   CLINICAL DATA:  Dysphasia. Difficulty swallowing. Pancytopenia. History of lymphadenopathy  EXAM: CT NECK WITH CONTRAST  TECHNIQUE: Multidetector CT imaging of the neck was performed using the standard protocol following the bolus administration of intravenous contrast.  CONTRAST:  66mL OMNIPAQUE IOHEXOL 300 MG/ML  SOLN  COMPARISON:  CT neck 02/02/2014  FINDINGS: Pharynx and larynx: Symmetric hypertrophy of the palatine tonsils similar to the prior study. Adenoid tissue also is prominent and unchanged.  Asymmetric enlargement of the aryepiglottic fold on the right appears more prominent than on the prior study. Vocal cords are normal. Epiglottis is not enlarged.  Salivary glands: Parotid and submandibular glands are normal in size and symmetric.  Thyroid: Normal  Lymph nodes: Scattered lymph nodes are mildly enlarged throughout the neck. Right level 1 submental node measures 8.7 mm in short axis similar to the prior study. Small left submental nodes unchanged. Right level 2 nodes measure 11 mm and 12 mm unchanged from the prior study. Subcentimeter posterior level 5 nodes on the right unchanged.  Left level 2 node measures 9 mm in short axis unchanged from the prior study. Posterior level 5 nodes on the left measures 7 mm and 12 mm similar to the prior study. No new adenopathy.  Vascular: Negative  Limited intracranial: Negative  Visualized orbits: Negative  Mastoids and visualized paranasal sinuses: Negative  Skeleton: Disc degeneration and spondylosis especially at C5-6. No bone lesions.  Upper chest:  Negative  IMPRESSION: Diffusely prominent lymph tissue in Waldeyer's ring as noted on the prior study.  Mild cervical adenopathy stable from the prior study. Question history of lymphoma.  Asymmetric enlargement aryepiglottic fold of the right slightly more prominent than the prior CT and may be the cause of  patient's dysphasia. This could be due to tumor such as lymphoma, carcinoma or inflammation. Direct visualization may be helpful .   Electronically Signed   By: Franchot Gallo M.D.   On: 06/01/2014 14:14    Review of Systems  HENT: Positive for sore throat.   All other systems reviewed and are negative.  Blood pressure 124/92, pulse 86, temperature 98.4 F (36.9 C), temperature source Oral, resp. rate 20, last menstrual period 04/24/2013, SpO2 100 %. Physical Exam  Constitutional: She is oriented to person, place, and time. She appears well-developed and well-nourished. No distress.  HENT:  Head: Normocephalic and atraumatic.  Right Ear: External ear normal.  Left Ear: External ear normal.  Nose: Nose normal.  Mouth/Throat: Oropharynx is clear and moist.  Fairly normal voice.  No stridor.  Eyes: Conjunctivae and EOM are normal. Pupils are equal, round, and reactive to light.  Neck: Normal range of motion. Neck supple.  Small nodes palpable in left zone 2 and right submental regions.  Cardiovascular: Normal rate.   Respiratory: Effort normal.  Musculoskeletal: Normal range of motion.  Neurological: She is alert and oriented to person, place, and time.  Skin: Skin is warm and dry.  Psychiatric: She has a normal mood and affect. Her behavior is normal. Judgment and thought content normal.    Assessment/Plan: Acute supraglottitis I personally reviewed her neck x-ray and CT scan.  In order to visualize the area better, a fiberoptic exam of the larynx was performed.  See procedure note.  The right arytenoid area is edematous but the airway is not immediately threatened.  With the history of symptoms, this most likely represents infection and she has improved today after clindamycin dosing.  I recommend continuing that medication.  She can be changed to oral dosing when swallow improves and discharged with follow-up with Dr. Erik Obey later next week.  Please call back if symptoms do not  improve.  Daniesha Driver 06/01/2014, 6:10 PM

## 2014-06-01 NOTE — ED Notes (Signed)
ENT MD at bedside.

## 2014-06-01 NOTE — ED Notes (Signed)
Attempted to call report on pt., Floor sts that they had 3 pt's assigned at once and have no nurse assigned to this pt. At this time. Floor to call this RN back.

## 2014-06-01 NOTE — ED Provider Notes (Addendum)
16:54 Dr. Benjamine Mola presented to notify that the patient is not unassigned and that she is with [facility] ENT. He has left and advised to contact [facility] ENT regarding an evaluation of the patient. At this time the patient is not known to myself I will evaluate her and notify the admitting physician of this development.  Charlesetta Shanks, MD 06/01/14 1656  17:00 I was unable to assess the patient as she had immediately been taken to her admission room after Dr. Benjamine Mola had left the room. Nurse has been advised to contact the hospitalist and make them aware that the patient had not had a full ENT evaluation and that Dr. Benjamine Mola advises the patient must be seen by [facility] ENT.  Charlesetta Shanks, MD 06/01/14 [phone]

## 2014-06-01 NOTE — Procedures (Signed)
Preop diagnosis: Throat pain, dysphagia Postop diagnosis: same, acute supraglottitis Procedure: Transnasal fiberoptic laryngoscopy Surgeon: Redmond Baseman Anesth: None Compl: None Findings: Right arytenoid region with prominent, rounded edema hooding the glottis somewhat without erythema.  Left arytenoid with mild, more chronic-appearing edema.  Glottis is normal with normal mobility.  Epiglottis is normal. Description: After discussing risks, benefits, and alternatives, the fiberoptic laryngoscope was introduced through the right nasal passage and passed into the pharynx to view the pharynx and larynx.  Findings are noted above.  After completion, the scope was removed and she was returned to nursing care in stable condition.

## 2014-06-02 DIAGNOSIS — F1410 Cocaine abuse, uncomplicated: Secondary | ICD-10-CM

## 2014-06-02 DIAGNOSIS — F331 Major depressive disorder, recurrent, moderate: Secondary | ICD-10-CM

## 2014-06-02 DIAGNOSIS — I2510 Atherosclerotic heart disease of native coronary artery without angina pectoris: Secondary | ICD-10-CM

## 2014-06-02 LAB — BASIC METABOLIC PANEL
Anion gap: 8 (ref 5–15)
BUN: 10 mg/dL (ref 6–23)
CO2: 21 mmol/L (ref 19–32)
Calcium: 8.7 mg/dL (ref 8.4–10.5)
Chloride: 109 mmol/L (ref 96–112)
Creatinine, Ser: 0.75 mg/dL (ref 0.50–1.10)
GFR calc non Af Amer: >90 mL/min (ref >90)
GLUCOSE: 113 mg/dL — AB (ref 70–99)
POTASSIUM: 3.6 mmol/L (ref 3.5–5.1)
Sodium: 138 mmol/L (ref 135–145)

## 2014-06-02 LAB — DIFFERENTIAL
BASOS ABS: 0 10*3/uL (ref 0.0–0.1)
BASOS PCT: 0 % (ref 0–1)
Band Neutrophils: 0 % (ref 0–10)
Blasts: 0 %
EOS ABS: 0 10*3/uL (ref 0.0–0.7)
Eosinophils Relative: 0 % (ref 0–5)
LYMPHS PCT: 33 % (ref 12–46)
Lymphs Abs: 0.7 10*3/uL (ref 0.7–4.0)
METAMYELOCYTES PCT: 0 %
MONO ABS: 0.9 10*3/uL (ref 0.1–1.0)
MONOS PCT: 40 % — AB (ref 3–12)
Myelocytes: 0 %
Neutro Abs: 0.6 10*3/uL — ABNORMAL LOW (ref 1.7–7.7)
Neutrophils Relative %: 27 % — ABNORMAL LOW (ref 43–77)
Promyelocytes Absolute: 0 %
WBC MORPHOLOGY: INCREASED
nRBC: 0 /100 WBC

## 2014-06-02 LAB — CBC
HCT: 31.6 % — ABNORMAL LOW (ref 36.0–46.0)
Hemoglobin: 9.9 g/dL — ABNORMAL LOW (ref 12.0–15.0)
MCH: 24.1 pg — AB (ref 26.0–34.0)
MCHC: 31.3 g/dL (ref 30.0–36.0)
MCV: 77.1 fL — ABNORMAL LOW (ref 78.0–100.0)
PLATELETS: 195 10*3/uL (ref 150–400)
RBC: 4.1 MIL/uL (ref 3.87–5.11)
RDW: 16.6 % — AB (ref 11.5–15.5)
WBC: 2.2 10*3/uL — ABNORMAL LOW (ref 4.0–10.5)

## 2014-06-02 MED ORDER — TBO-FILGRASTIM 480 MCG/0.8ML ~~LOC~~ SOSY
480.0000 ug | PREFILLED_SYRINGE | Freq: Every day | SUBCUTANEOUS | Status: DC
  Administered 2014-06-02: 480 ug via SUBCUTANEOUS
  Filled 2014-06-02 (×2): qty 0.8

## 2014-06-02 NOTE — Consult Note (Addendum)
I saw the patient and examined her.  The drug screen is (+) for cocaine. 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 Typically, it is "tainted" cocaine that is the issue.   I cannot find anything else on her exam. No adenopathy. No splenomegaly.  No rashes.  I still think that a bone marrow will help.  I did look at her at her smear.  I did not see anything unusual except for the lack of neutrophils.  Red cells were ok.  Lymphocytes look ok.  Platelets appeared normal.  If this is cocaine induced only means to improve this is to stop the cocaine.    I will leave a full note later.  Laurey Arrow

## 2014-06-02 NOTE — Progress Notes (Signed)
Progress Note   Morgan Bean XHB:[id number] DOB: May 20, 1965 DOA: 06/01/2014 PCP: [name], MD   Brief Narrative:   Morgan Bean is an 49 y.o. female with a very detailed and complicated PMH including lymphadenopathy and neutropenia of unclear etiology, under the care of Dr. Alvy Bimler for ongoing evaluation of this, CAD, hypertension, polysubstance abuse, leukocytoclastic vasculitis, pulmonary hypertension, asthma, chronic diastolic CHF who was admitted 06/01/14 with a 3 day history of solid and liquid dysphagia and pharyngitis.  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 Redmond Baseman of ENT. Strep screen negative. - Continue empiric clindamycin. - CT scan findings worrisome for underlying malignancy, although she has had a biopsy of a cervical lymph node in the past which was unrevealing. Will need close follow-up with ENT post discharge.  Active Problems:   Active cocaine abuse - Counseled.  Admits to recent use after being abstinent for 6 months. - Social work consultation for substance abuse counseling and referral to outpatient treatment. - Check HIV status.   Depression - Continue Wellbutrin and Celexa.   CAD - Continue Plavix, trazodone and Lipitor.   Neutropenia - Has seen Dr. Alvy Bimler with evaluation including a bone marrow biopsy. - She has not responded to colony stimulating factors in the past, that we are treating with Neupogen per recommendations of Dr Marin Olp. - She has responded to prednisone in the past. She also has been given IVIG. - Dr. Alvy Bimler has felt that her neutropenia is related to cocaine abuse. UDS cocaine positive. - Save smear for review.   Anemia in chronic illness - Hold iron therapy for now.    Hypertension - Continue Norvasc and Clonidine.   DVT prophylaxis - SCDs.  Code Status: Full. Family  Communication: Ronny Bacon (daughter) is emergency contact. Disposition Plan: Home when stable.   IV Access:    Peripheral IV   Procedures and diagnostic studies:   Dg Neck Soft Tissue 06/01/2014: Thickening of epiglottis and aryepiglottic fold on the right with a nodular appearance. This could be due to infection such is epiglottitis however given the presence of nodular thickening of the right epiglottic area epiglottic fold previously, this could also represent neoplasm. Consider followup CT neck with contrast.     Ct Soft Tissue Neck W Contrast 06/01/2014: Diffusely prominent lymph tissue in Waldeyer's ring as noted on the prior study.  Mild cervical adenopathy stable from the prior study. Question history of lymphoma.  Asymmetric enlargement aryepiglottic fold of the right slightly more prominent than the prior CT and may be the cause of patient's dysphasia. This could be due to tumor such as lymphoma, carcinoma or inflammation. Direct visualization may be helpful .     Medical Consultants:    Dr. Melida Quitter, ENT  Dr. Burney Gauze, Oncology  Anti-Infectives:    Clindamycin 06/01/14--->  Subjective:   Morgan Bean feels better with improving throat pain and is able to swallow now, requesting diet advancement.  No fevers.  No nausea or vomiting.  Objective:    Filed Vitals:   06/01/14 2125 06/02/14 0559 06/02/14 0937 06/02/14 1332  BP: 119/81 121/85 108/75 110/83  Pulse: 83 75 89 83  Temp: 97.7 F (36.5 C) 98 F (36.7 C)  98.2 F (36.8 C)  TempSrc: Oral Oral  Oral  Resp: _0 SpO2: 99% 100%  99%    Intake/Output Summary (Last 24 hours) at  06/02/14 1353 Last data filed at 06/02/14 1300  Gross per 24 hour  Intake 1896.6 ml  Output    900 ml  Net  996.6 ml    Exam: Gen:  NAD Cardiovascular:  RRR, No M/R/G Respiratory:  Lungs CTAB Gastrointestinal:  Abdomen soft, NT/ND, + BS Extremities:  No C/E/C   Data Reviewed:    Labs: Basic Metabolic  Panel:  Recent Labs Lab 06/01/14 0934 06/02/14 0434  NA 137 138  K 3.6 3.6  CL 109 109  CO2 22 21  GLUCOSE 117* 113*  BUN 9 10  CREATININE 0.95 0.75  CALCIUM 8.8 8.7   GFR Estimated Creatinine Clearance: 96.7 mL/min (by C-G formula based on Cr of 0.75). Liver Function Tests:  Recent Labs Lab 06/01/14 1905  AST 25  ALT 23  ALKPHOS 140*  BILITOT 0.5  PROT 9.4*  ALBUMIN 3.7   Coagulation profile  Recent Labs Lab 06/01/14 0934  INR 1.08    CBC:  Recent Labs Lab 06/01/14 0934 06/02/14 0434  WBC 1.9* 2.2*  NEUTROABS 0.0* 0.6*  HGB 10.4* 9.9*  HCT 33.3* 31.6*  MCV 78.0 77.1*  PLT 183 195   BNP (last 3 results)  Recent Labs  06/02/13 1800 08/06/13 1843 04/25/14 1048  PROBNP 500.8* 1186.0* 43.0   Microbiology Recent Results (from the past 240 hour(s))  Rapid strep screen     Status: None   Collection Time: 06/01/14  3:04 PM  Result Value Ref Range Status   Streptococcus, Group A Screen (Direct) NEGATIVE NEGATIVE Final    Comment: (NOTE) A Rapid Antigen test may result negative if the antigen level in the sample is below the detection level of this test. The FDA has not cleared this test as a stand-alone test therefore the rapid antigen negative result has reflexed to a Group A Strep culture.      Medications:   . amLODipine  10 mg Oral Daily  . antiseptic oral rinse  7 mL Mouth Rinse BID  . atorvastatin  80 mg Oral q1800  . buPROPion  150 mg Oral BID  . citalopram  40 mg Oral Daily  . clindamycin (CLEOCIN) IV  600 mg Intravenous 3 times per day  . cloNIDine  0.2 mg Oral BID  . clopidogrel  75 mg Oral Daily  . heparin  5,000 Units Subcutaneous 3 times per day  . loratadine  10 mg Oral Daily  . multivitamin with minerals  1 tablet Oral Daily  . pantoprazole  40 mg Oral Daily  . traZODone  100 mg Oral QHS   Continuous Infusions: . 0.9 % NaCl with KCl 20 mEq / L 100 mL/hr at 06/02/14 7341    Time spent: 25 minutes.   LOS: 1 day    Morgan Bean  [facility] Pager [phone]. If unable to reach me by pager, please call my cell phone at [phone].  *Please refer to [email or web address], password [email or web address] to get updated schedule on who will round on this patient, as hospitalists switch teams weekly. If 7PM-7AM, please contact night-coverage at [email or web address], password [email or web address] for any overnight needs.  06/02/2014, 1:53 PM

## 2014-06-03 LAB — HIV ANTIBODY (ROUTINE TESTING W REFLEX): HIV Screen 4th Generation wRfx: NONREACTIVE

## 2014-06-03 LAB — CBC WITH DIFFERENTIAL/PLATELET
BASOS PCT: 1 % (ref 0–1)
Basophils Absolute: 0 10*3/uL (ref 0.0–0.1)
EOS PCT: 0 % (ref 0–5)
Eosinophils Absolute: 0 10*3/uL (ref 0.0–0.7)
HEMATOCRIT: 29.8 % — AB (ref 36.0–46.0)
Hemoglobin: 9.4 g/dL — ABNORMAL LOW (ref 12.0–15.0)
LYMPHS ABS: 1.4 10*3/uL (ref 0.7–4.0)
Lymphocytes Relative: 44 % (ref 12–46)
MCH: 24.3 pg — ABNORMAL LOW (ref 26.0–34.0)
MCHC: 31.5 g/dL (ref 30.0–36.0)
MCV: 77 fL — ABNORMAL LOW (ref 78.0–100.0)
Monocytes Absolute: 0.8 10*3/uL (ref 0.1–1.0)
Monocytes Relative: 28 % — ABNORMAL HIGH (ref 3–12)
NEUTROS ABS: 0.8 10*3/uL — AB (ref 1.7–7.7)
NEUTROS PCT: 27 % — AB (ref 43–77)
Platelets: 167 10*3/uL (ref 150–400)
RBC: 3.87 MIL/uL (ref 3.87–5.11)
RDW: 17 % — AB (ref 11.5–15.5)
WBC: 3 10*3/uL — ABNORMAL LOW (ref 4.0–10.5)

## 2014-06-03 MED ORDER — ACETAMINOPHEN 325 MG PO TABS: 650.0000 mg | ORAL_TABLET | Freq: Four times a day (QID) | ORAL | Status: DC | PRN

## 2014-06-03 MED ORDER — CLINDAMYCIN HCL 300 MG PO CAPS: 300.0000 mg | ORAL_CAPSULE | Freq: Three times a day (TID) | ORAL | Status: DC

## 2014-06-03 NOTE — Clinical Social Work Note (Signed)
CSW was consulted on 06/02/14 for S/A consult.  CSW attempted to do assessment on 06/03/14 at noon but pt had been discharged.  Dede Query, LCSW [place] Worker - Weekend Coverage cell #: [phone]

## 2014-06-03 NOTE — Progress Notes (Signed)
Pt. Discharged to home. Left via ambulation with boyfriend. No respiratory distress noted.

## 2014-06-03 NOTE — Progress Notes (Signed)
Discharge teaching completed with teach back. Discharge instructions given, reviewed with pt and pt. signed. Cleocin prescription given. Pt. Understands to call Monday for follow up appointments. Answered all questions.

## 2014-06-03 NOTE — Discharge Instructions (Signed)
Leukopenia  Leukopenia is a condition in which you have a low number of white blood cells. White blood cells help your body fight infections. The number of white blood cells in the body varies from person to person. Leukopenia is usually defined as having fewer than 4,000 white blood cells in 1 microliter of blood.  There are five types of white blood cells. Two types make up most of your white blood cell count. These are neutrophils and lymphocytes. When your level of neutrophils is low, it is called neutropenia. When your lymphocytes are low, it is called lymphocytopenia. Neutropenia is the most dangerous type of leukopenia because it can lead to dangerous infections.  CAUSES   Most white blood cells are made in the soft tissue inside your bones (bone marrow). Conditions that damage or suppress bone marrow are the most common causes of leukopenia. These include:  · Medicine or X-ray treatments for cancer.  · Serious infections.  · Cancer of the white blood cells (leukemia or myeloma).  · Medicines, including antibiotics, cardiac drugs, steroids, and those used to treat rheumatoid arthritis.  Leukopenia also happens when white blood cells are destroyed after leaving your bone marrow. Causes may include:  · Liver disease.  · Diseases of the immune system (autoimmune disease).  · Vitamin B deficiencies.  SIGNS AND SYMPTOMS  One of the most common signs of leukopenia, especially severe neutropenia, is having a lot of bacterial infections. Different infections have different symptoms. An infection in your lungs may cause coughing. A urinary tract infection may cause frequent urination and a burning sensation. You may also get infections of the blood, skin, rectum, throat, sinus, or ear.  General signs and symptoms of leukopenia include:  · Fever.  · Fatigue.  · Swollen glands (lymph nodes).  · Painful mouth ulcers.  · Gum disease.  DIAGNOSIS   Your health care provider can diagnose leukopenia based on a physical exam  and the results of lab tests. During a physical exam, your health care provider will feel for swollen lymph nodes and check whether your spleen is enlarged. Your spleen is an organ on the left side of your body that stores white blood cells. Tests that may be done include:  · A complete blood count. This blood test counts each type of white cell.  · Bone marrow aspiration. Some bone marrow is removed to be checked under a microscope.  · Lymph node biopsy. Some lymph node tissue is removed to be checked under a microscope.  · Other types of blood tests or imaging tests.  TREATMENT   Treatment of leukopenia depends on the cause. Some common treatments include:  · Antibiotics for bacterial infections.  · No longer taking medicines that may cause leukopenia.  · Vitamin B supplements.  · Medicines to stimulate neutrophil production (hematopoietic growth factors) for neutropenia.  HOME CARE INSTRUCTIONS   Preventing infection is important if you have leukopenia.  · Avoid sick friends and family members.  · Wash your hands often.  · Do not eat uncooked or undercooked meats.  · Wash fruits and vegetables.  · Do not eat or drink unpasteurized dairy products.  · Get regular dental care, and maintain good dental hygiene.  · Keep all follow-up appointments.  SEEK MEDICAL CARE IF:  · You have chills or a fever.  · You have signs or symptoms of infection.  SEEK IMMEDIATE MEDICAL CARE IF:  · You have a fever or persistent symptoms for more than 2-3 days.  ·   You have trouble breathing.  · You have chest pain.  MAKE SURE YOU:  · Understand these instructions.  · Will watch your condition.  · Will get help right away if you are not doing well or get worse.  Document Released: 03/14/2013 Document Reviewed: 03/14/2013  [employer]® Patient Information ©2015 [employer]. This information is not intended to replace advice given to you by your health care provider. Make sure you discuss any questions you have with your health care  provider.

## 2014-06-03 NOTE — Discharge Summary (Signed)
Physician Discharge Summary  Morgan Bean BZJ:[id number] DOB: 06/04/1965 DOA: 06/01/2014  PCP: [name], MD  Admit date: 06/01/2014 Discharge date: 06/03/2014   Recommendations for Outpatient Follow-Up:   1. Recommend PCP or Dr. Alvy Bimler check a CBC/differential in 1 week. 2. Please F/U on HIV antibody testing, pending at D/C.   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    Cocaine abuse    Neutropenia    Anemia in chronic illness    Lymphadenopathy   Discharge Condition: Improved.  Diet recommendation:  Regular.   History of Present Illness:   Morgan Bean is an 49 y.o. female with a very detailed and complicated PMH including lymphadenopathy and neutropenia of unclear etiology, under the care of Dr. Alvy Bimler for ongoing evaluation of this, CAD, hypertension, polysubstance abuse, leukocytoclastic vasculitis, pulmonary hypertension, asthma, chronic diastolic CHF who was admitted 06/01/14 with a 3 day history of solid and liquid dysphagia and pharyngitis.   Hospital Course by Problem:   Principal Problem:  Pharyngeal mass and lymphadenopathy in the setting of neutropenia - Felt to be infectious status post direct visualization by Dr. Redmond Baseman of ENT. Strep screen negative. - Discharged home on 5 more days of oral clindamycin. - CT scan findings worrisome for underlying malignancy, although she has had a biopsy of a cervical lymph node in the past which was unrevealing. Will need close follow-up with ENT post discharge.  Active Problems:  Active cocaine abuse - Counseled. Admits to recent use after being abstinent for 6 months. - Social work consultation for substance abuse counseling and referral to outpatient treatment. - F/U HIV status.   Depression -  Continue Wellbutrin and Celexa.   CAD - Continue Plavix, trazodone and Lipitor.   Neutropenia - Has seen Dr. Alvy Bimler with evaluation including a bone marrow biopsy. - She has not responded to colony stimulating factors in the past, but we are treating with Neupogen per recommendations of Dr Marin Olp and this has resulted in improved neutrophil count. - She has responded to prednisone in the past. She also has been given IVIG. - Dr. Alvy Bimler has felt that her neutropenia is related to cocaine abuse. UDS cocaine positive. - Save smear for review.   Anemia in chronic illness - Resume iron therapy.    Hypertension - Continue Norvasc and Clonidine.    Medical Consultants:    Dr. Melida Quitter, ENT  Dr. Burney Gauze, Oncology   Discharge Exam:   Filed Vitals:   06/03/14 1008  BP: 120/80  Pulse: 101  Temp:   Resp: 20   Filed Vitals:   06/02/14 1332 06/02/14 2050 06/03/14 0439 06/03/14 1008  BP: 110/83 118/77 119/83 120/80  Pulse: 83 90 86 101  Temp: 98.2 F (36.8 C) 98.7 F (37.1 C) 98.2 F (36.8 C)   TempSrc: Oral Oral Oral   Resp: $Remo'18 20 20 20  'WDhZi$ SpO2: 99% 100% 99%     Gen:  NAD Cardiovascular:  RRR, No M/R/G Respiratory: Lungs CTAB Gastrointestinal: Abdomen soft, NT/ND with normal active bowel sounds. Extremities: No C/E/C   The results of significant diagnostics from this hospitalization (including imaging, microbiology, ancillary and laboratory) are listed below for reference.     Procedures and Diagnostic Studies:   Dg Neck Soft Tissue 06/01/2014: Thickening of epiglottis and aryepiglottic fold on the right with  a nodular appearance. This could be due to infection such is epiglottitis however given the presence of nodular thickening of the right epiglottic area epiglottic fold previously, this could also represent neoplasm. Consider followup CT neck with contrast.   Ct Soft Tissue Neck W Contrast 06/01/2014: Diffusely prominent lymph tissue in Waldeyer's  ring as noted on the prior study. Mild cervical adenopathy stable from the prior study. Question history of lymphoma. Asymmetric enlargement aryepiglottic fold of the right slightly more prominent than the prior CT and may be the cause of patient's dysphasia. This could be due to tumor such as lymphoma, carcinoma or inflammation. Direct visualization may be helpful.   Labs:   Basic Metabolic Panel:  Recent Labs Lab 06/01/14 0934 06/02/14 0434  NA 137 138  K 3.6 3.6  CL 109 109  CO2 22 21  GLUCOSE 117* 113*  BUN 9 10  CREATININE 0.95 0.75  CALCIUM 8.8 8.7   GFR Estimated Creatinine Clearance: 96.7 mL/min (by C-G formula based on Cr of 0.75). Liver Function Tests:  Recent Labs Lab 06/01/14 1905  AST 25  ALT 23  ALKPHOS 140*  BILITOT 0.5  PROT 9.4*  ALBUMIN 3.7   Coagulation profile  Recent Labs Lab 06/01/14 0934  INR 1.08    CBC:  Recent Labs Lab 06/01/14 0934 06/02/14 0434 06/03/14 0440  WBC 1.9* 2.2* 3.0*  NEUTROABS 0.0* 0.6* 0.8*  HGB 10.4* 9.9* 9.4*  HCT 33.3* 31.6* 29.8*  MCV 78.0 77.1* 77.0*  PLT 183 195 167   Microbiology Recent Results (from the past 240 hour(s))  Rapid strep screen     Status: None   Collection Time: 06/01/14  3:04 PM  Result Value Ref Range Status   Streptococcus, Group A Screen (Direct) NEGATIVE NEGATIVE Final    Comment: (NOTE) A Rapid Antigen test may result negative if the antigen level in the sample is below the detection level of this test. The FDA has not cleared this test as a stand-alone test therefore the rapid antigen negative result has reflexed to a Group A Strep culture.      Discharge Instructions:   Discharge Instructions    Activity as tolerated - No restrictions    Complete by:  As directed      Call MD for:  severe uncontrolled pain    Complete by:  As directed      Call MD for:  temperature >100.4    Complete by:  As directed      Diet general    Complete by:  As directed      Discharge  instructions    Complete by:  As directed   You were cared for by Dr. Jacquelynn Cree  (a hospitalist) during your hospital stay. If you have any questions about your discharge medications or the care you received while you were in the hospital after you are discharged, you can call the unit and ask to speak with the hospitalist on call if the hospitalist that took care of you is not available. Once you are discharged, your primary care physician will handle any further medical issues. Please note that NO REFILLS for any discharge medications will be authorized once you are discharged, as it is imperative that you return to your primary care physician (or establish a relationship with a primary care physician if you do not have one) for your aftercare needs so that they can reassess your need for medications and monitor your lab values.  Any outstanding tests can  be reviewed by your PCP at your follow up visit.  It is also important to review any medicine changes with your PCP.  Please bring these d/c instructions with you to your next visit so your physician can review these changes with you.  If you do not have a primary care physician, you can call [phone] for a physician referral.  It is highly recommended that you obtain a PCP for hospital follow up.  Do not use cocaine.  This drug is thought to be causing your immune system to fail which puts you at risk for life threatening infections.            Medication List    STOP taking these medications        butalbital-acetaminophen-caffeine 50-325-40 MG per tablet  Commonly known as:  FIORICET      TAKE these medications        acetaminophen 325 MG tablet  Commonly known as:  TYLENOL  Take 2 tablets (650 mg total) by mouth every 6 (six) hours as needed for mild pain (or Fever >/= 101).     albuterol 108 (90 BASE) MCG/ACT inhaler  Commonly known as:  PROVENTIL HFA;VENTOLIN HFA  Inhale 2 puffs into the lungs every 2 (two) hours as needed for  wheezing or shortness of breath (cough).     amLODipine 10 MG tablet  Commonly known as:  NORVASC  TAKE ONE TABLET DAILY     atorvastatin 80 MG tablet  Commonly known as:  LIPITOR  Take 1 tablet (80 mg total) by mouth daily.     buPROPion 150 MG 12 hr tablet  Commonly known as:  WELLBUTRIN SR  Take 150 mg by mouth 2 (two) times daily.     citalopram 40 MG tablet  Commonly known as:  CELEXA  Take 1 tablet (40 mg total) by mouth daily.     clindamycin 300 MG capsule  Commonly known as:  CLEOCIN  Take 1 capsule (300 mg total) by mouth 3 (three) times daily.     cloNIDine 0.2 MG tablet  Commonly known as:  CATAPRES  Take 1 tablet (0.2 mg total) by mouth 2 (two) times daily.     clopidogrel 75 MG tablet  Commonly known as:  PLAVIX  Take 1 tablet (75 mg total) by mouth daily.     ferrous sulfate 325 (65 FE) MG tablet  Take 650 mg by mouth daily with breakfast.     furosemide 40 MG tablet  Commonly known as:  LASIX  TAKE 1/2 TABLET BY MOUTH TWICE DAILY     hydrOXYzine 25 MG tablet  Commonly known as:  ATARAX/VISTARIL  Take 25 mg by mouth at bedtime as needed (sleep).     loratadine 10 MG tablet  Commonly known as:  CLARITIN  Take 1 tablet (10 mg total) by mouth daily.     multivitamin with minerals Tabs tablet  Take 1 tablet by mouth daily.     omeprazole 40 MG capsule  Commonly known as:  PRILOSEC  Take 40 mg by mouth daily.     polyethylene glycol powder powder  Commonly known as:  GLYCOLAX/MIRALAX  Take 17 g by mouth daily as needed (constipation).     potassium chloride 10 MEQ tablet  Commonly known as:  K-DUR,KLOR-CON  Take 1 tablet (10 mEq total) by mouth 2 (two) times daily with a meal.     traZODone 100 MG tablet  Commonly known as:  DESYREL  Take 1 tablet (  100 mg total) by mouth at bedtime.           Follow-up Information    Follow up with [name], MD. Schedule an appointment as soon as possible for a visit in 1 week.   Specialty:   Internal Medicine   Why:  Hospital follow up.   Contact information:   [address] [place] [address] [phone]       Follow up with [facility], [place], MD. Schedule an appointment as soon as possible for a visit in 1 week.   Specialty:  Hematology and Oncology   Why:  To follow up on your immune suppression.   Contact information:   [place] [id number] [phone]        Time coordinating discharge: 35 minutes.  Signed:  RAMA,CHRISTINA  Pager [phone] [facility] 06/03/2014, 11:17 AM

## 2014-06-05 ENCOUNTER — Telehealth: Payer: Self-pay | Admitting: Hematology and Oncology

## 2014-06-05 LAB — CULTURE, GROUP A STREP

## 2014-06-05 NOTE — Telephone Encounter (Signed)
sent msg to Dr Alvy Bimler for d.t for hospital follow up

## 2014-06-13 ENCOUNTER — Other Ambulatory Visit: Payer: Self-pay

## 2014-06-13 ENCOUNTER — Ambulatory Visit: Payer: Self-pay | Admitting: Hematology and Oncology

## 2014-06-13 ENCOUNTER — Telehealth: Payer: Self-pay | Admitting: *Deleted

## 2014-06-13 NOTE — Telephone Encounter (Signed)
Pt did not show up for her scheduled appt today.   Called pt and left her a VM to return nurse's call to make sure she is ok.  Dr. Alvy Bimler can see her in 2 weeks on 4/6 at 9:15 am.  She will need to come in at 8:45 am for labs same day.

## 2014-06-19 ENCOUNTER — Other Ambulatory Visit: Payer: Self-pay | Admitting: Hematology and Oncology

## 2014-07-02 ENCOUNTER — Telehealth: Payer: Self-pay | Admitting: Hematology and Oncology

## 2014-07-02 ENCOUNTER — Other Ambulatory Visit: Payer: Self-pay | Admitting: Cardiology

## 2014-07-02 ENCOUNTER — Other Ambulatory Visit: Payer: Self-pay | Admitting: Physician Assistant

## 2014-07-02 DIAGNOSIS — I5032 Chronic diastolic (congestive) heart failure: Secondary | ICD-10-CM

## 2014-07-02 MED ORDER — POTASSIUM CHLORIDE CRYS ER 10 MEQ PO TBCR: 10.0000 meq | EXTENDED_RELEASE_TABLET | Freq: Two times a day (BID) | ORAL | Status: DC

## 2014-07-02 NOTE — Telephone Encounter (Signed)
pt called  to sched appt...done....pt ok and aware of new d.t °

## 2014-07-06 ENCOUNTER — Inpatient Hospital Stay: Payer: Self-pay | Admitting: Internal Medicine

## 2014-07-13 ENCOUNTER — Inpatient Hospital Stay: Payer: Self-pay | Admitting: Internal Medicine

## 2014-07-16 ENCOUNTER — Inpatient Hospital Stay: Payer: Self-pay | Admitting: Family Medicine

## 2014-07-25 ENCOUNTER — Telehealth: Payer: Self-pay | Admitting: *Deleted

## 2014-07-25 ENCOUNTER — Ambulatory Visit: Payer: Self-pay | Admitting: Hematology and Oncology

## 2014-07-25 ENCOUNTER — Other Ambulatory Visit: Payer: Self-pay

## 2014-07-25 ENCOUNTER — Other Ambulatory Visit: Payer: Self-pay | Admitting: Hematology and Oncology

## 2014-07-25 NOTE — Telephone Encounter (Signed)
-----   Message from Heath Lark, MD sent at 07/25/2014  8:42 AM EDT ----- Regarding: No show X 2 She is no show times 2. Please call to see if she can come in today with labs. If not, we will not reschedule next appt

## 2014-07-25 NOTE — Telephone Encounter (Signed)
Left VM for pt asking her to return nurse's call regarding missed appt this morning.  Asked if she can come in today.

## 2014-08-01 ENCOUNTER — Encounter: Payer: Self-pay | Admitting: Nurse Practitioner

## 2014-08-01 ENCOUNTER — Ambulatory Visit (INDEPENDENT_AMBULATORY_CARE_PROVIDER_SITE_OTHER): Payer: No Typology Code available for payment source | Admitting: Nurse Practitioner

## 2014-08-01 VITALS — BP 120/80 | HR 78 | Ht 66.0 in | Wt 207.0 lb

## 2014-08-01 DIAGNOSIS — I10 Essential (primary) hypertension: Secondary | ICD-10-CM

## 2014-08-01 DIAGNOSIS — I38 Endocarditis, valve unspecified: Secondary | ICD-10-CM

## 2014-08-01 DIAGNOSIS — I5032 Chronic diastolic (congestive) heart failure: Secondary | ICD-10-CM

## 2014-08-01 DIAGNOSIS — I2510 Atherosclerotic heart disease of native coronary artery without angina pectoris: Secondary | ICD-10-CM

## 2014-08-01 MED ORDER — CLONIDINE HCL 0.2 MG PO TABS: 0.2000 mg | ORAL_TABLET | Freq: Two times a day (BID) | ORAL | Status: DC

## 2014-08-01 MED ORDER — AMLODIPINE BESYLATE 10 MG PO TABS: 10.0000 mg | ORAL_TABLET | Freq: Every day | ORAL | Status: DC

## 2014-08-01 MED ORDER — FUROSEMIDE 40 MG PO TABS: 20.0000 mg | ORAL_TABLET | Freq: Two times a day (BID) | ORAL | Status: DC

## 2014-08-01 NOTE — Patient Instructions (Addendum)
We will be checking the following labs today - NONE  Medication Instructions:    Continue with your current medicines.   I refilled your medicines today    Testing/Procedures To Be Arranged:  None  Follow-Up:   See Dr. Aundra Dubin in follow up in 4 months in the CHF clinic    Other Special Instructions:   Keep restricting the salt  Walking is good  Work on stopping smoking  Call the [place] office at [phone] if you have any questions, problems or concerns.

## 2014-08-01 NOTE — Progress Notes (Signed)
CARDIOLOGY OFFICE NOTE  Date:  08/01/2014    [name] Date of Birth: 10/28/1965 Medical Record #[id number]  PCP:  [name], MD  Cardiologist:  Aundra Dubin  Chief Complaint  Patient presents with  . FU for diastolic HF, valvular heart disease, & CAD    Follow up visit - seen for Dr. Aundra Dubin     History of Present Illness: Morgan Bean is a 49 y.o. female who presents today for a follow up visit. Seen for Dr. Aundra Dubin. She has a hx of diastolic CHF, tricuspid regurgitation, abnormal echo with mod pulmonary HTN in 11/2009 but no evidence of pulmonary HTN from echo of 2015, prior CVA, nonobstructive CAD and cocaine abuse. She has continued to have problems with exertional dyspnea. Last echo showed normal EF, moderate LVH, and only mild TR (was severe in the past). She has smoked and is thought to have COPD. She saw Dr. [name] and PFTs did not show much airflow obstruction. She had a lot of upper airway symptoms including cough and she was switched to an ARB.    Admitted in 05/2013 for neutropenic fever. She's been followed by oncology. She's had extensive workup. 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   Last seen by Dr. Aundra Dubin 07/2013.  She saw neurology in 09/2013 for suspected TIA.  She was seen in the ED 04/12/14. She presented with productive cough, chest pain and dyspnea. Chest CT confirm pneumonia. Enzymes remained normal. She was discharged on antibiotics and prednisone.   Last seen by Richardson Dopp, PA back in February of 2016. Continued to have issues with dyspnea and occasional chest pain. Referred for Myoview which was low risk. BP was low and her clonidine was cut back.   Comes in today. Here alone. She says she is doing ok. Nothing "unusual" going on. Still with DOE - this is chronic. She has gone back to smoking. Tries to walk some. Weight is stable. BP is good at home. No real chest pain. She needs medicines  refilled. Not clear to me why this was not refilled by our office since she was seen back in February.   Past Medical History: 1. Fe-deficiency anemia 2. HTN 3. Asthma 4. Active smoker 5. Prior cocaine 6. 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 ANA positive. Thought to be secondary to cocaine use.  8. Pulmonary hypertension/tricuspid regurgitation: Echo (9/11) with EF 65%, mild LVH, mild AI, mild MR, moderate to possibly severe TR with PA systolic pressure 52 mmHg. Echo (10/12): severe LV hypertrophy, EF 60-65%, mild MR, mild AI, moderate to severe tricuspid regurgitation, PA systolic pressure 38 mmHg. RHC (10/12) with mean RA 4, PA 28/9, mean PCWP 9, CI 3.8. Echo (4/14) with EF 65%, moderate LVH, mild AI, mild MR, mild TR.  9. Chest pain: Lexiscan myoview (10/12) with significant ST depression upon Lexiscan injection but no ischemia or infarction on perfusion images. Left heart cath (10/12): 30% mid LAD, 30% ostial D1, 50% ostial D2.  10. Diastolic CHF: Echo (6/29) with EF 60-65%, moderate LVH, mild AI, mild MR.  11. COPD: At most mild. PFTs in 6/14 with no obstructive by spirometry but mild air-trapping. Mild restriction as well.  12. Pancytopenia: Bone marrow biopsy negative for lymphoma/leukemia. Thought to be related to an agent in cocaine.  48. H/o syphilis   Past Medical History  Diagnosis Date  . Coronary artery disease  Lexiscan myoview (10/12) with significant ST depression upon Lexiscan injection but no ischemia or infarction on perfusion images. Left heart cath (10/12): 30% mid LAD, 30% ostial D1, 50% ostial D2.    [name] Hypertension   . Tricuspid regurgitation   . Iron deficiency     hx of  . Polysubstance abuse     Prior cocaine  . Tobacco abuse   . Leukocytoclastic vasculitis     Rash across lower body, occurred in 9/11, diagnosed by skin biopsy. ANA positive. Thought to be secondary to  cocaine use.  . Pulmonary HTN     Echo (9/11) with EF 65%, mild LVH, mild AI, mild MR, moderate to possibly severe TR with PA systolic pressure 52 mmHg. Echo (10/12): severe LV hypertrophy, EF 60-65%, mild MR, mild AI, moderate to severe tricuspid regurgitation, PA systolic pressure 38 mmHg.  Echo 4/14: moderate LVH, EF 65%, normal wall motion, diastolic dysfunction, mild AI, mild MR  . Asthma   . Shortness of breath     a. PFTs 5/14:  FEV1/FVC 99% predicted; FVC 60% predicted; DLCO mildly reduced, mild restriction, air trapping.;  b.  seen by pulmo (Dr. [name]) 5/14: mainly upper airway symptoms - ACE d/c'd (sx's better)  . Chronic diastolic CHF (congestive heart failure)   . Constipation   . Lymphadenopathy 03/21/2013  . Leukopenia 03/21/2013  . Unspecified deficiency anemia 03/29/2013  . Anginal pain     none in past year  . Anxiety   . Mental disorder   . Depression   . Stroke 2010ish  . Arthritis   . Pancytopenia   . Hyperlipemia   . Syphilis 04/25/2013  . Candidiasis 06/23/2013  . Anemia of other chronic disease 11/13/2013  . Cough 11/13/2013  . Lymphadenopathy 01/30/2014  . Headache 02/22/2014  . Hx of cardiovascular stress test     Lexiscan Myoview (2/16): Breast attenuation artifact, no ischemia, EF 64%; low risk    Past Surgical History  Procedure Laterality Date  . Ankle surgery      right  . Cardiac catheterization    . I&d extremity  08/09/2011    Procedure: IRRIGATION AND DEBRIDEMENT EXTREMITY;  Surgeon: [name], MD;  Location: [facility];  Service: General;  Laterality: Left;  i & D Left axilla abscess  . Lymph node biopsy N/A 04/05/2013    Procedure: EXCISIONAL BIOPSY RIGHT SUBMANDIBULAR NODE, NASAL ENDOSCOPIC WITH BIOPSY NASAL PHARYNX;  Surgeon: [name], MD;  Location: [facility];  Service: ENT;  Laterality: N/A;     Medications: Current Outpatient Prescriptions  Medication Sig Dispense Refill  . acetaminophen (TYLENOL) 325 MG tablet Take 2 tablets (650 mg  total) by mouth every 6 (six) hours as needed for mild pain (or Fever >/= 101).    [name] albuterol (PROVENTIL HFA;VENTOLIN HFA) 108 (90 BASE) MCG/ACT inhaler Inhale 2 puffs into the lungs every 2 (two) hours as needed for wheezing or shortness of breath (cough). 3 Inhaler 3  . amLODipine (NORVASC) 10 MG tablet TAKE ONE TABLET DAILY 30 tablet 5  . atorvastatin (LIPITOR) 80 MG tablet Take 1 tablet (80 mg total) by mouth daily. 30 tablet 3  . buPROPion (WELLBUTRIN SR) 150 MG 12 hr tablet Take 150 mg by mouth 2 (two) times daily.    . citalopram (CELEXA) 40 MG tablet Take 1 tablet (40 mg total) by mouth daily. 30 tablet 1  . cloNIDine (CATAPRES) 0.2 MG tablet Take 1 tablet (0.2 mg total) by mouth 2 (two) times daily.  60 tablet 11  . clopidogrel (PLAVIX) 75 MG tablet Take 1 tablet (75 mg total) by mouth daily. 90 tablet 3  . ferrous sulfate 325 (65 FE) MG tablet Take 650 mg by mouth daily with breakfast.    . furosemide (LASIX) 40 MG tablet TAKE 1/2 TABLET BY MOUTH TWICE DAILY 30 tablet 1  . hydrOXYzine (ATARAX/VISTARIL) 25 MG tablet Take 25 mg by mouth at bedtime as needed (sleep).    . loratadine (CLARITIN) 10 MG tablet Take 1 tablet (10 mg total) by mouth daily. 30 tablet 3  . Multiple Vitamin (MULTIVITAMIN WITH MINERALS) TABS Take 1 tablet by mouth daily.    [name] omeprazole (PRILOSEC) 40 MG capsule Take 40 mg by mouth daily.    [name] omeprazole (PRILOSEC) 40 MG capsule Take 1 capsule (40 mg total) by mouth daily. 30 capsule 1  . polyethylene glycol powder (GLYCOLAX/MIRALAX) powder Take 17 g by mouth daily as needed (constipation).     . potassium chloride (K-DUR,KLOR-CON) 10 MEQ tablet Take 1 tablet (10 mEq total) by mouth 2 (two) times daily with a meal. 60 tablet 1  . traZODone (DESYREL) 100 MG tablet Take 1 tablet (100 mg total) by mouth at bedtime. 30 tablet 1   No current facility-administered medications for this visit.    Allergies: Allergies  Allergen Reactions  . Ciprofloxacin Itching     Social History: The patient  reports that she has been smoking Cigarettes.  She has a 7.5 pack-year smoking history. She has never used smokeless tobacco. She reports that she uses illicit drugs ("Crack" cocaine) about once per week. She reports that she does not drink alcohol.   Family History: The patient's family history includes Breast cancer in her maternal grandmother; Coronary artery disease in her father and mother; Diabetes in her father, maternal grandmother, and mother; Heart attack in her father and maternal grandmother; Hypertension in her father; Stroke in her maternal grandmother.   Review of Systems: Please see the history of present illness.   Otherwise, the review of systems is positive for chronic DOE.   All other systems are reviewed and negative.   Physical Exam: VS:  BP 120/80 mmHg  Pulse 78  Ht 5' 6" (1.676 m)  Wt 207 lb (93.895 kg)  BMI 33.43 kg/m2  SpO2 100%  LMP 04/24/2013 .  BMI Body mass index is 33.43 kg/(m^2).  Wt Readings from Last 3 Encounters:  08/01/14 207 lb (93.895 kg)  05/21/14 205 lb 3.2 oz (93.078 kg)  05/14/14 203 lb (92.08 kg)    General: Pleasant. Well developed, well nourished and in no acute distress.  HEENT: Normal. Neck: Supple, no JVD, carotid bruits, or masses noted.  Cardiac: Regular rate and rhythm. No murmurs, rubs, or gallops. No edema.  Respiratory:  Lungs are clear to auscultation bilaterally with normal work of breathing.  GI: Soft and nontender.  MS: No deformity or atrophy. Gait and ROM intact. Skin: Warm and dry. Color is normal.  Neuro:  Strength and sensation are intact and no gross focal deficits noted.  Psych: Alert, appropriate and with normal affect.   LABORATORY DATA:  EKG:  EKG is ordered today. This shows NSR with nonspecific changes.    Lab Results  Component Value Date   WBC 3.0* 06/03/2014   HGB 9.4* 06/03/2014   HCT 29.8* 06/03/2014   PLT 167 06/03/2014   GLUCOSE 113* 06/02/2014   CHOL 149  01/02/2014   TRIG 91.0 01/02/2014   HDL 47.80 01/02/2014  LDLDIRECT 142.1 12/25/2010   LDLCALC 83 01/02/2014   ALT 23 06/01/2014   AST 25 06/01/2014   NA 138 06/02/2014   K 3.6 06/02/2014   CL 109 06/02/2014   CREATININE 0.75 06/02/2014   BUN 10 06/02/2014   CO2 21 06/02/2014   TSH 1.680 11/13/2013   INR 1.08 06/01/2014   HGBA1C 5.2 10/04/2013   MICROALBUR 14.16* 08/06/2009    BNP (last 3 results)  Recent Labs  04/12/14 1359  BNP 165.5*    ProBNP (last 3 results)  Recent Labs  08/06/13 1843 04/25/14 1048  PROBNP 1186.0* 43.0     Other Studies Reviewed Today:  Myoview Impression from 04/2014 Exercise Capacity: Lexiscan with no exercise. BP Response: Normal blood pressure response. Clinical Symptoms: Mild chest pain/dyspnea. ECG Impression: Slight downsloping of the ST segments in recovery in the inferolateral leads in recovery Comparison with Prior Nuclear Study: No images to compare  Overall Impression: Low risk stress nuclear study with a small in size, mild in intensity, fixed defect in the mid and basal inferolateral wall consistent with breast attenuation artifact. No evidence of ischemia. There was mild downsloping of the ST segments in the inferolateral leads in recovery.   LV Ejection Fraction: 64%. LV Wall Motion: NL LV Function; NL Wall Motion  Signed: Fransico Him, MD [facility] 05/10/2014    Echo Study Conclusions from 08/2013  - Left ventricle: The cavity size was normal. There was moderate focal basal septal and mild concentric hypertrophy of the let ventricle.. Systolic function was normal. The estimated ejection fraction was in the range of 60% to 65%. Wall motion was normal; there were no regional wall motion abnormalities. Features are consistent with a pseudonormal left ventricular filling pattern, with concomitant abnormal relaxation and increased filling pressure (grade 2 diastolic dysfunction). Doppler  parameters are consistent with high ventricular filling pressure. - Aortic valve: Trileaflet; normal thickness leaflets. There was mild regurgitation. - Aortic root: The aortic root was normal in size. - Mitral valve: Mildly thickened leaflets . There was mild regurgitation. - Left atrium: The atrium was mildly dilated. - Right ventricle: Systolic function was normal. - Tricuspid valve: There was moderate regurgitation. - Pulmonary arteries: Systolic pressure was within the normal range. - Inferior vena cava: The vessel was normal in size. - Pericardium, extracardiac: There was no pericardial effusion.  Impressions:  - Normal biventricular function. Pseudonormal pattern of diastolic dysfunction with normal filling pressures. Mild aortic, mitral and pulmonic regurgitation. Moderate tricuspid regurgitation with normal RVSP.  Assessment/Plan:  1. Shortness of Breath: Overall, she appears to be stable from a volume standpoint. She continues to note significant dyspnea. She had an echocardiogram with normal LV function and stable moderate tricuspid regurgitation. She continues to smoke. I have left her on her current regimen.   2. Diastolic CHF: As noted, volume appears stable.   3. Valvular heart disease with stable echo in 2015  4. Pancytopenia: Follow-up with hematology/oncology.  5. Hypertension: BP looks good on her current regimen. I have refilled her medicines today.  6. Coronary Artery Disease: Non-obstructive CAD by [facility] in 2012. As noted, she has atypical chest pain. She had mild non-obstructive disease by cardiac catheterization the past. Continue Plavix, Lipitor, amlodipine.  7. Smoking - cessation encouraged.  Current medicines are reviewed with the patient today.  The patient does not have concerns regarding medicines other than what has been noted above.  The following changes have been made:  See above.  Labs/ tests ordered today include:  No orders of the defined types were placed in this encounter.     Disposition:   FU with Dr. Aundra Dubin in 4 months in the CHF clinic and then decide about who needs to follow going forward.   Patient is agreeable to this plan and will call if any problems develop in the interim.   Signed: Burtis Junes, RN, ANP-C 08/01/2014 8:35 AM  [facility] [address] [place], [place]  [address] Phone: [phone] Fax: [phone]

## 2014-08-09 ENCOUNTER — Ambulatory Visit: Payer: Self-pay | Admitting: Cardiology

## 2014-08-13 ENCOUNTER — Ambulatory Visit: Payer: Self-pay | Admitting: Cardiology

## 2014-08-13 ENCOUNTER — Telehealth: Payer: Self-pay | Admitting: Hematology and Oncology

## 2014-08-13 ENCOUNTER — Encounter (HOSPITAL_COMMUNITY): Payer: Self-pay | Admitting: Family Medicine

## 2014-08-13 ENCOUNTER — Emergency Department (HOSPITAL_COMMUNITY)
Admission: EM | Admit: 2014-08-13 | Discharge: 2014-08-13 | Disposition: A | Discharge status: Home / Self Care | Payer: No Typology Code available for payment source | Attending: Emergency Medicine | Admitting: Emergency Medicine

## 2014-08-13 DIAGNOSIS — F329 Major depressive disorder, single episode, unspecified: Secondary | ICD-10-CM | POA: Insufficient documentation

## 2014-08-13 DIAGNOSIS — Z720 Tobacco use: Secondary | ICD-10-CM | POA: Insufficient documentation

## 2014-08-13 DIAGNOSIS — L0291 Cutaneous abscess, unspecified: Secondary | ICD-10-CM

## 2014-08-13 DIAGNOSIS — I10 Essential (primary) hypertension: Secondary | ICD-10-CM | POA: Insufficient documentation

## 2014-08-13 DIAGNOSIS — L0390 Cellulitis, unspecified: Secondary | ICD-10-CM

## 2014-08-13 DIAGNOSIS — Z79899 Other long term (current) drug therapy: Secondary | ICD-10-CM | POA: Insufficient documentation

## 2014-08-13 DIAGNOSIS — J45909 Unspecified asthma, uncomplicated: Secondary | ICD-10-CM | POA: Insufficient documentation

## 2014-08-13 DIAGNOSIS — Z7902 Long term (current) use of antithrombotics/antiplatelets: Secondary | ICD-10-CM | POA: Insufficient documentation

## 2014-08-13 DIAGNOSIS — L0211 Cutaneous abscess of neck: Secondary | ICD-10-CM | POA: Insufficient documentation

## 2014-08-13 DIAGNOSIS — M1990 Unspecified osteoarthritis, unspecified site: Secondary | ICD-10-CM | POA: Insufficient documentation

## 2014-08-13 DIAGNOSIS — L03221 Cellulitis of neck: Secondary | ICD-10-CM | POA: Insufficient documentation

## 2014-08-13 DIAGNOSIS — D649 Anemia, unspecified: Secondary | ICD-10-CM | POA: Insufficient documentation

## 2014-08-13 DIAGNOSIS — Z9889 Other specified postprocedural states: Secondary | ICD-10-CM | POA: Insufficient documentation

## 2014-08-13 DIAGNOSIS — Z8719 Personal history of other diseases of the digestive system: Secondary | ICD-10-CM | POA: Insufficient documentation

## 2014-08-13 DIAGNOSIS — F419 Anxiety disorder, unspecified: Secondary | ICD-10-CM | POA: Insufficient documentation

## 2014-08-13 DIAGNOSIS — I5032 Chronic diastolic (congestive) heart failure: Secondary | ICD-10-CM | POA: Insufficient documentation

## 2014-08-13 DIAGNOSIS — Z8673 Personal history of transient ischemic attack (TIA), and cerebral infarction without residual deficits: Secondary | ICD-10-CM | POA: Insufficient documentation

## 2014-08-13 DIAGNOSIS — I2510 Atherosclerotic heart disease of native coronary artery without angina pectoris: Secondary | ICD-10-CM | POA: Insufficient documentation

## 2014-08-13 MED ORDER — CEPHALEXIN 250 MG PO CAPS
500.0000 mg | ORAL_CAPSULE | Freq: Once | ORAL | Status: AC
  Administered 2014-08-13: 500 mg via ORAL
  Filled 2014-08-13: qty 2

## 2014-08-13 MED ORDER — CEPHALEXIN 500 MG PO CAPS: 500.0000 mg | ORAL_CAPSULE | Freq: Four times a day (QID) | ORAL | Status: DC

## 2014-08-13 MED ORDER — SULFAMETHOXAZOLE-TRIMETHOPRIM 800-160 MG PO TABS: 1.0000 | ORAL_TABLET | Freq: Two times a day (BID) | ORAL | Status: DC

## 2014-08-13 MED ORDER — LIDOCAINE-EPINEPHRINE (PF) 2 %-1:200000 IJ SOLN
10.0000 mL | Freq: Once | INTRAMUSCULAR | Status: AC
  Administered 2014-08-13: 10 mL via INTRADERMAL
  Filled 2014-08-13: qty 20

## 2014-08-13 MED ORDER — SULFAMETHOXAZOLE-TRIMETHOPRIM 800-160 MG PO TABS
1.0000 | ORAL_TABLET | Freq: Once | ORAL | Status: AC
  Administered 2014-08-13: 1 via ORAL
  Filled 2014-08-13: qty 1

## 2014-08-13 NOTE — Telephone Encounter (Signed)
pt called to sched appt...gv her 1st available....pt ok and aware

## 2014-08-13 NOTE — ED Notes (Signed)
Declined W/C at D/C and was escorted to lobby by RN. 

## 2014-08-13 NOTE — ED Provider Notes (Signed)
CSN: [id number]     Arrival date & time 08/13/14  1623 History   This chart was scribed for Monico Blitz, PA working with Evelina Bucy, MD by Meriel Pica, ED Scribe. [name] was seen in room TR07C/TR07C and the Morgan Bean's care was started 5:14 PM.   Chief Complaint  Morgan Bean presents with  . Abscess   The history is provided by the Morgan Bean. No language interpreter was used.    HPI Comments: Morgan Bean is a 49 y.o. female, with no PMhx of DM, who presents to the Emergency Department complaining of an gradually, worsening area of pain and swelling on the back left side of her neck. Morgan Bean rates the pain as a 4/10 with no aggravation but a 10/10 with touch.  Morgan Bean denies fevers or chills. Morgan Bean reports Morgan Bean has not taken any pain medication for relief pta. Morgan Bean reports a prior history of abscess.   Past Medical History  Diagnosis Date  . Coronary artery disease     Lexiscan myoview (10/12) with significant ST depression upon Lexiscan injection but no ischemia or infarction on perfusion images. Left heart cath (10/12): 30% mid LAD, 30% ostial D1, 50% ostial D2.    [name] Hypertension   . Tricuspid regurgitation   . Iron deficiency     hx of  . Polysubstance abuse     Prior cocaine  . Tobacco abuse   . Leukocytoclastic vasculitis     Rash across lower body, occurred in 9/11, diagnosed by skin biopsy. ANA positive. Thought to be secondary to cocaine use.  . Pulmonary HTN     Echo (9/11) with EF 65%, mild LVH, mild AI, mild MR, moderate to possibly severe TR with PA systolic pressure 52 mmHg. Echo (10/12): severe LV hypertrophy, EF 60-65%, mild MR, mild AI, moderate to severe tricuspid regurgitation, PA systolic pressure 38 mmHg.  Echo 4/14: moderate LVH, EF 65%, normal wall motion, diastolic dysfunction, mild AI, mild MR  . Asthma   . Shortness of breath     a. PFTs 5/14:  FEV1/FVC 99% predicted; FVC 60% predicted; DLCO mildly reduced, mild restriction, air trapping.;  b.  seen by pulmo (Dr. [name])  5/14: mainly upper airway symptoms - ACE d/c'd (sx's better)  . Chronic diastolic CHF (congestive heart failure)   . Constipation   . Lymphadenopathy 03/21/2013  . Leukopenia 03/21/2013  . Unspecified deficiency anemia 03/29/2013  . Anginal pain     none in past year  . Anxiety   . Mental disorder   . Depression   . Stroke 2010ish  . Arthritis   . Pancytopenia   . Hyperlipemia   . Syphilis 04/25/2013  . Candidiasis 06/23/2013  . Anemia of other chronic disease 11/13/2013  . Cough 11/13/2013  . Lymphadenopathy 01/30/2014  . Headache 02/22/2014  . Hx of cardiovascular stress test     Lexiscan Myoview (2/16): Breast attenuation artifact, no ischemia, EF 64%; low risk   Past Surgical History  Procedure Laterality Date  . Ankle surgery      right  . Cardiac catheterization    . I&d extremity  08/09/2011    Procedure: IRRIGATION AND DEBRIDEMENT EXTREMITY;  Surgeon: [name], MD;  Location: [place];  Service: General;  Laterality: Left;  i & D Left axilla abscess  . Lymph node biopsy N/A 04/05/2013    Procedure: EXCISIONAL BIOPSY RIGHT SUBMANDIBULAR NODE, NASAL ENDOSCOPIC WITH BIOPSY NASAL PHARYNX;  Surgeon: [name], MD;  Location: [place];  Service: ENT;  Laterality: N/A;   Family History  Problem Relation Age of Onset  . Coronary artery disease Mother   . Diabetes Mother   . Breast cancer Maternal Grandmother   . Diabetes Maternal Grandmother   . Diabetes Father   . Hypertension Father   . Coronary artery disease Father   . Heart attack Maternal Grandmother   . Stroke Maternal Grandmother   . Heart attack Father    History  Substance Use Topics  . Smoking status: Current Every Day Smoker -- 0.25 packs/day for 30 years    Types: Cigarettes    Last Attempt to Quit: 05/23/2013  . Smokeless tobacco: Never Used  . Alcohol Use: No     Comment: pint of wine 2-3 times a week--ocassional - quit 2012   OB History    Gravida Para Term Preterm AB TAB SAB Ectopic Multiple Living    4 2 2  2 2    2      Review of Systems  Constitutional: Negative for fever and chills.  Skin: Positive for color change.  All other systems reviewed and are negative.   Allergies  Ciprofloxacin  Home Medications   Prior to Admission medications   Medication Sig Start Date End Date Taking? Authorizing Provider  acetaminophen (TYLENOL) 325 MG tablet Take 2 tablets (650 mg total) by mouth every 6 (six) hours as needed for mild pain (or Fever >/= 101). 06/03/14  Yes Venetia Maxon Rama, MD  albuterol (PROVENTIL HFA;VENTOLIN HFA) 108 (90 BASE) MCG/ACT inhaler Inhale 2 puffs into the lungs every 2 (two) hours as needed for wheezing or shortness of breath (cough). 05/25/14  Yes Tresa Garter, MD  amLODipine (NORVASC) 10 MG tablet Take 1 tablet (10 mg total) by mouth daily. 08/01/14  Yes Burtis Junes, NP  atorvastatin (LIPITOR) 80 MG tablet Take 1 tablet (80 mg total) by mouth daily. 11/14/13  Yes [name], MD  buPROPion [facility] SR) 150 MG 12 hr tablet Take 150 mg by mouth 2 (two) times daily.   Yes Historical Provider, MD  citalopram (CELEXA) 40 MG tablet Take 1 tablet (40 mg total) by mouth daily. 08/25/13  Yes Tresa Garter, MD  cloNIDine (CATAPRES) 0.2 MG tablet Take 1 tablet (0.2 mg total) by mouth 2 (two) times daily. 08/01/14  Yes Burtis Junes, NP  clopidogrel (PLAVIX) 75 MG tablet Take 1 tablet (75 mg total) by mouth daily. 05/14/14  Yes Cameron Sprang, MD  ferrous sulfate 325 (65 FE) MG tablet Take 650 mg by mouth daily with breakfast.   Yes Historical Provider, MD  furosemide (LASIX) 40 MG tablet Take 0.5 tablets (20 mg total) by mouth 2 (two) times daily. 08/01/14  Yes Burtis Junes, NP  hydrOXYzine (ATARAX/VISTARIL) 25 MG tablet Take 25 mg by mouth at bedtime as needed (sleep).   Yes Historical Provider, MD  loratadine (CLARITIN) 10 MG tablet Take 1 tablet (10 mg total) by mouth daily. 09/20/13  Yes Lance Bosch, NP  Multiple Vitamin (MULTIVITAMIN WITH MINERALS)  TABS Take 1 tablet by mouth daily. 11/18/11  Yes Milana Huntsman Readling, MD  omeprazole (PRILOSEC) 40 MG capsule Take 1 capsule (40 mg total) by mouth daily. 07/02/14  Yes [name], MD  polyethylene glycol powder (GLYCOLAX/MIRALAX) powder Take 17 g by mouth daily as needed (constipation).    Yes Historical Provider, MD  potassium chloride (K-DUR,KLOR-CON) 10 MEQ tablet Take 1 tablet (10 mEq total) by mouth 2 (two) times daily with a  meal. 07/02/14  Yes [name], MD  traZODone (DESYREL) 100 MG tablet Take 1 tablet (100 mg total) by mouth at bedtime. 08/25/13  Yes Tresa Garter, MD   Triage Vitals: BP 123/84 mmHg  Pulse 88  Temp(Src) 98.3 F (36.8 C) (Oral)  Resp 16  Ht 5\' 6"  (1.676 m)  Wt 207 lb (93.895 kg)  BMI 33.43 kg/m2  SpO2 98%  LMP 04/24/2013  Physical Exam  Constitutional: Morgan Bean appears well-developed and well-nourished.  HENT:  Head: Normocephalic and atraumatic.  Eyes: Conjunctivae are normal. Right eye exhibits no discharge. Left eye exhibits no discharge. No scleral icterus.  Cardiovascular: Normal rate, regular rhythm and normal heart sounds.   Pulmonary/Chest: Effort normal and breath sounds normal. No respiratory distress.  Neurological: Morgan Bean is alert. Coordination normal.  Skin: Skin is warm and dry. No rash noted. Morgan Bean is not diaphoretic.  1cm fluctuant, actively draining abscess to her left neck with 1cm of surrounding cellulitis.   Psychiatric: Morgan Bean has a normal mood and affect.  Nursing note and vitals reviewed.   ED Course  INCISION AND DRAINAGE Date/Time: 08/13/2014 7:21 PM Performed by: Monico Blitz Authorized by: Monico Blitz Consent: Verbal consent obtained. Consent given by: Morgan Bean Morgan Bean identity confirmed: verbally with Morgan Bean Type: abscess Body area: head/neck Location details: neck Anesthesia: local infiltration Local anesthetic: lidocaine 2% with epinephrine Anesthetic total: 2 ml Morgan Bean sedated: no Scalpel size:  11 Incision type: single straight Complexity: simple Drainage: purulent Drainage amount: moderate Wound treatment: wound left open Packing material: none Morgan Bean tolerance: Morgan Bean tolerated the procedure well with no immediate complications    DIAGNOSTIC STUDIES: Oxygen Saturation is 98% on RA, normal by my interpretation.    COORDINATION OF CARE: 5:19 PM Discussed treatment plan with Morgan Bean to I&D abscess and administer bactrim at bedside. Morgan Bean agreed to course of treatment.   Labs Review Labs Reviewed - No data to display  Imaging Review No results found.   EKG Interpretation None      MDM   Final diagnoses:  None    Filed Vitals:   08/13/14 1637 08/13/14 1736  BP: 123/84 109/81  Pulse: 88 82  Temp: 98.3 F (36.8 C)   TempSrc: Oral   Resp: 16 16  Height: 5\' 6"  (1.676 m)   Weight: 207 lb (93.895 kg)   SpO2: 98% 99%    Medications  lidocaine-EPINEPHrine (XYLOCAINE W/EPI) 2 %-1:200000 (PF) injection 10 mL (10 mLs Intradermal Given 08/13/14 1738)  sulfamethoxazole-trimethoprim (BACTRIM DS,SEPTRA DS) 800-160 MG per tablet 1 tablet (1 tablet Oral Given 08/13/14 1738)  cephALEXin (KEFLEX) capsule 500 mg (500 mg Oral Given 08/13/14 1737)    Katti Pelle is a pleasant 49 y.o. female presenting with abscess with slight surrounding cellulitis to left neck. No signs of systemic infection, Morgan Bean states that Morgan Bean has gotten abscesses several times in the past. States that Morgan Bean has a primary care physician who regularly checks her for diabetes. I and D performed, Morgan Bean will be started on Bactrim and Keflex.  Evaluation does not show pathology that would require ongoing emergent intervention or inpatient treatment. Morgan Bean is hemodynamically stable and mentating appropriately. Discussed findings and plan with Morgan Bean/guardian, who agrees with care plan. All questions answered. Return precautions discussed and outpatient follow up given.   Discharge Medication List as of 08/13/2014   5:30 PM    START taking these medications   Details  cephALEXin (KEFLEX) 500 MG capsule Take 1 capsule (500 mg total) by mouth 4 (four) times daily., Starting  08/13/2014, Until Discontinued, Print    sulfamethoxazole-trimethoprim (BACTRIM DS) 800-160 MG per tablet Take 1 tablet by mouth 2 (two) times daily., Starting 08/13/2014, Until Discontinued, Print         I personally performed the services described in this documentation, which was scribed in my presence. The recorded information has been reviewed and is accurate.   Monico Blitz, PA-C 08/13/14 1923  Evelina Bucy, MD 08/13/14 [phone]

## 2014-08-13 NOTE — Discharge Instructions (Signed)
If you see worsening signs of infection (warmth, redness, tenderness, pus, sharp increase in pain, fever, red streaking) immediately return to the emergency department.  Please follow with your primary care doctor in the next 2 days for a check-up. They must obtain records for further management.   Do not hesitate to return to the Emergency Department for any new, worsening or concerning symptoms.    Abscess Care After An abscess (also called a boil or furuncle) is an infected area that contains a collection of pus. Signs and symptoms of an abscess include pain, tenderness, redness, or hardness, or you may feel a moveable soft area under your skin. An abscess can occur anywhere in the body. The infection may spread to surrounding tissues causing cellulitis. A cut (incision) by the surgeon was made over your abscess and the pus was drained out. Gauze may have been packed into the space to provide a drain that will allow the cavity to heal from the inside outwards. The boil may be painful for 5 to 7 days. Most people with a boil do not have high fevers. Your abscess, if seen early, may not have localized, and may not have been lanced. If not, another appointment may be required for this if it does not get better on its own or with medications. HOME CARE INSTRUCTIONS   Only take over-the-counter or prescription medicines for pain, discomfort, or fever as directed by your caregiver.  When you bathe, soak and then remove gauze or iodoform packs at least daily or as directed by your caregiver. You may then wash the wound gently with mild soapy water. Repack with gauze or do as your caregiver directs. SEEK IMMEDIATE MEDICAL CARE IF:   You develop increased pain, swelling, redness, drainage, or bleeding in the wound site.  You develop signs of generalized infection including muscle aches, chills, fever, or a general ill feeling.  An oral temperature above 102 F (38.9 C) develops, not controlled by  medication. See your caregiver for a recheck if you develop any of the symptoms described above. If medications (antibiotics) were prescribed, take them as directed. Document Released: 09/25/2004 Document Revised: 06/01/2011 Document Reviewed: 05/23/2007 [facility] Patient Information 2015 [place], [place]. This information is not intended to replace advice given to you by your health care provider. Make sure you discuss any questions you have with your health care provider.

## 2014-08-13 NOTE — ED Notes (Addendum)
Pt thinks she was bitten by insect on Sat night.  Abscess to L neck @ hairline.  Denies fevers.

## 2014-08-23 ENCOUNTER — Other Ambulatory Visit: Payer: Self-pay | Admitting: Cardiology

## 2014-08-23 ENCOUNTER — Ambulatory Visit: Payer: Self-pay | Admitting: Hematology and Oncology

## 2014-08-23 ENCOUNTER — Other Ambulatory Visit: Payer: Self-pay

## 2014-09-21 ENCOUNTER — Encounter: Payer: Self-pay | Admitting: Hematology and Oncology

## 2014-09-21 ENCOUNTER — Ambulatory Visit (HOSPITAL_BASED_OUTPATIENT_CLINIC_OR_DEPARTMENT_OTHER): Payer: No Typology Code available for payment source | Admitting: Hematology and Oncology

## 2014-09-21 ENCOUNTER — Telehealth: Payer: Self-pay | Admitting: Hematology and Oncology

## 2014-09-21 VITALS — BP 110/76 | HR 84 | Temp 98.1°F | Resp 18 | Ht 66.0 in | Wt 214.6 lb

## 2014-09-21 DIAGNOSIS — D709 Neutropenia, unspecified: Secondary | ICD-10-CM

## 2014-09-21 DIAGNOSIS — D638 Anemia in other chronic diseases classified elsewhere: Secondary | ICD-10-CM

## 2014-09-21 DIAGNOSIS — Z720 Tobacco use: Secondary | ICD-10-CM

## 2014-09-21 NOTE — Assessment & Plan Note (Signed)
This is likely anemia of chronic disease. The patient denies recent history of bleeding such as epistaxis, hematuria or hematochezia. She is asymptomatic from the anemia. We will observe for now.  She does not require transfusion now.   

## 2014-09-21 NOTE — Assessment & Plan Note (Signed)
She has persistent, severe neutropenia, unresponsive to prednisone and recent G-CSF. She had multiple bone marrow biopsy performed in the past and overall consensus from multiple subspecialties were this is related to cocaine abuse. The patient denies cocaine abuse for over 6 months. Previously, she had nonspecific lymphadenopathy and lymph node biopsy was non-diagnostic. Repeat CT scan show nonspecific lymphadenopathy that overall is improving. In the meantime, she continue prednisone taper to 2.5 mg. After review of her case at the most recent hematology tumor board, I recommend a trial of IVIG.  She has no response to IVIG. We discussed other options including rituximab, observation, or referral to tertiary center. Due to lack of symptoms, the patient elected for observation only and I think it is reasonable. I will see her back in 3 months with repeat blood work history and physical examination. I reinforced the importance of hand hygiene and close communication with me. Should she suspect signs of infection, she will call me immediately for further evaluation and management.

## 2014-09-21 NOTE — Assessment & Plan Note (Signed)
I spent some time counseling the patient the importance of tobacco cessation. she is currently attempting to quit on her own 

## 2014-09-21 NOTE — Progress Notes (Signed)
[place] OFFICE PROGRESS NOTE  [name], MD SUMMARY OF HEMATOLOGIC HISTORY:  This is a pleasant lady who is being referred here because of background of severe pancytopenia, lymphadenopathy and abnormal lymph node and bone marrow biopsy. Further evaluation suspect the patient may have syphilis. She was treated with doxycycline with worsening pancytopenia. Her antibiotic treatment was discontinued. From 06/02/2013 to 06/09/2013, the patient was admitted to the hospital for further workup for neutropenic fever. She was placed on G-CSF with no response to therapy. Repeat bone marrow biopsy was done. The patient was given broad-spectrum intravenous antibiotics and subsequently discharged for further followup as an outpatient.  On 06/14/2013, I started her on prednisone 60 mg daily. She was readmitted to the hospital again for neutropenic fever. 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 On 08/04/2013, I advised further prednisone taper. She is given G-CSF for severe neutropenia with no response. She was subsequently observed. In August 2015, she was noted to a recurrence of severe neutropenia. She was sent back on prednisone with initial good response to treatment but then relapsed soon after initiation of prednisone taper  on 02/20/2014, she was given IVIG treatment. On 03/06/2014, prednisone taper is initiated to 2.5 mg daily, subsequently discharged INTERVAL HISTORY: Colette Dicamillo 49 y.o. female returns for further follow-up. She denies recent infection. She is getting married in 4 weeks. Denies new skin lesion. No new lymphadenopathy.  I have reviewed the past medical history, past surgical history, social history and family history with the patient and they are unchanged from previous note.  ALLERGIES:  is allergic to ciprofloxacin.  MEDICATIONS:  Current Outpatient Prescriptions  Medication Sig Dispense  Refill  . acetaminophen (TYLENOL) 325 MG tablet Take 2 tablets (650 mg total) by mouth every 6 (six) hours as needed for mild pain (or Fever >/= 101).    [name] albuterol (PROVENTIL HFA;VENTOLIN HFA) 108 (90 BASE) MCG/ACT inhaler Inhale 2 puffs into the lungs every 2 (two) hours as needed for wheezing or shortness of breath (cough). 3 Inhaler 3  . amLODipine (NORVASC) 10 MG tablet Take 1 tablet (10 mg total) by mouth daily. 90 tablet 3  . atorvastatin (LIPITOR) 80 MG tablet Take 1 tablet (80 mg total) by mouth daily. 30 tablet 3  . buPROPion (WELLBUTRIN SR) 150 MG 12 hr tablet Take 150 mg by mouth 2 (two) times daily.    . citalopram (CELEXA) 40 MG tablet Take 1 tablet (40 mg total) by mouth daily. 30 tablet 1  . cloNIDine (CATAPRES) 0.2 MG tablet Take 1 tablet (0.2 mg total) by mouth 2 (two) times daily. 180 tablet 3  . clopidogrel (PLAVIX) 75 MG tablet Take 1 tablet (75 mg total) by mouth daily. 90 tablet 3  . ferrous sulfate 325 (65 FE) MG tablet Take 650 mg by mouth daily with breakfast.    . furosemide (LASIX) 40 MG tablet Take 0.5 tablets (20 mg total) by mouth 2 (two) times daily. 90 tablet 3  . hydrOXYzine (ATARAX/VISTARIL) 25 MG tablet Take 25 mg by mouth at bedtime as needed (sleep).    . loratadine (CLARITIN) 10 MG tablet Take 1 tablet (10 mg total) by mouth daily. 30 tablet 3  . Multiple Vitamin (MULTIVITAMIN WITH MINERALS) TABS Take 1 tablet by mouth daily.    [name] omeprazole (PRILOSEC) 40 MG capsule Take 1 capsule (40 mg total) by mouth daily. 30 capsule 1  . polyethylene glycol powder (GLYCOLAX/MIRALAX) powder Take  17 g by mouth daily as needed (constipation).     . potassium chloride (K-DUR,KLOR-CON) 10 MEQ tablet Take 1 tablet (10 mEq total) by mouth 2 (two) times daily with a meal. 60 tablet 1  . traZODone (DESYREL) 100 MG tablet Take 1 tablet (100 mg total) by mouth at bedtime. 30 tablet 1   No current facility-administered medications for this visit.     REVIEW OF SYSTEMS:    Constitutional: Denies fevers, chills or night sweats Eyes: Denies blurriness of vision Ears, nose, mouth, throat, and face: Denies mucositis or sore throat Respiratory: Denies cough, dyspnea or wheezes Cardiovascular: Denies palpitation, chest discomfort or lower extremity swelling Gastrointestinal:  Denies nausea, heartburn or change in bowel habits Skin: Denies abnormal skin rashes Lymphatics: Denies new lymphadenopathy or easy bruising Neurological:Denies numbness, tingling or new weaknesses Behavioral/Psych: Mood is stable, no new changes  All other systems were reviewed with the patient and are negative.  PHYSICAL EXAMINATION: ECOG PERFORMANCE STATUS: 0 - Asymptomatic  Filed Vitals:   09/21/14 0923  BP: 110/76  Pulse: 84  Temp: 98.1 F (36.7 C)  Resp: 18   Filed Weights   09/21/14 0923  Weight: 214 lb 9.6 oz (97.342 kg)    GENERAL:alert, no distress and comfortable SKIN: skin color, texture, turgor are normal, no rashes or significant lesions EYES: normal, Conjunctiva are pink and non-injected, sclera clear OROPHARYNX:no exudate, no erythema and lips, buccal mucosa, and tongue normal  NECK: supple, thyroid normal size, non-tender, without nodularity LYMPH:  no palpable lymphadenopathy in the cervical, axillary or inguinal LUNGS: clear to auscultation and percussion with normal breathing effort HEART: regular rate & rhythm and no murmurs and no lower extremity edema ABDOMEN:abdomen soft, non-tender and normal bowel sounds Musculoskeletal:no cyanosis of digits and no clubbing  NEURO: alert & oriented x 3 with fluent speech, no focal motor/sensory deficits  LABORATORY DATA:  I have reviewed the data as listed No results found for this or any previous visit (from the past 48 hour(s)).  Lab Results  Component Value Date   WBC 3.0* 06/03/2014   HGB 9.4* 06/03/2014   HCT 29.8* 06/03/2014   MCV 77.0* 06/03/2014   PLT 167 06/03/2014    RADIOGRAPHIC STUDIES: I have  personally reviewed the radiological images as listed and agreed with the findings in the report. No results found.  ASSESSMENT & PLAN:  Neutropenia She has persistent, severe neutropenia, unresponsive to prednisone and recent G-CSF. She had multiple bone marrow biopsy performed in the past and overall consensus from multiple subspecialties were this is related to cocaine abuse. The patient denies cocaine abuse for over 6 months. Previously, she had nonspecific lymphadenopathy and lymph node biopsy was non-diagnostic. Repeat CT scan show nonspecific lymphadenopathy that overall is improving. In the meantime, she continue prednisone taper to 2.5 mg. After review of her case at the most recent hematology tumor board, I recommend a trial of IVIG.  She has no response to IVIG. We discussed other options including rituximab, observation, or referral to tertiary center. Due to lack of symptoms, the patient elected for observation only and I think it is reasonable. I will see her back in 3 months with repeat blood work history and physical examination. I reinforced the importance of hand hygiene and close communication with me. Should she suspect signs of infection, she will call me immediately for further evaluation and management.  Anemia in chronic illness This is likely anemia of chronic disease. The patient denies recent history of bleeding such  as epistaxis, hematuria or hematochezia. She is asymptomatic from the anemia. We will observe for now.  She does not require transfusion now.     Tobacco abuse I spent some time counseling the patient the importance of tobacco cessation. she is currently attempting to quit on her own     All questions were answered. The patient knows to call the clinic with any problems, questions or concerns. No barriers to learning was detected.  I spent 15 minutes counseling the patient face to face. The total time spent in the appointment was 20 minutes and more  than 50% was on counseling.     [facility], Leotta Weingarten, MD 7/1/201610:08 AM

## 2014-09-21 NOTE — Telephone Encounter (Signed)
Gave adn pirnted appt sched and avs for pt for OCT

## 2014-10-12 ENCOUNTER — Other Ambulatory Visit: Payer: Self-pay | Admitting: Cardiology

## 2014-12-03 ENCOUNTER — Ambulatory Visit (HOSPITAL_COMMUNITY)
Admission: RE | Admit: 2014-12-03 | Discharge: 2014-12-03 | Disposition: A | Discharge status: Home / Self Care | Payer: No Typology Code available for payment source | Source: Ambulatory Visit | Attending: Cardiology | Admitting: Cardiology

## 2014-12-03 ENCOUNTER — Encounter (HOSPITAL_COMMUNITY): Payer: Self-pay

## 2014-12-03 VITALS — BP 116/70 | HR 83 | Wt 212.8 lb

## 2014-12-03 DIAGNOSIS — I071 Rheumatic tricuspid insufficiency: Secondary | ICD-10-CM | POA: Insufficient documentation

## 2014-12-03 DIAGNOSIS — I10 Essential (primary) hypertension: Secondary | ICD-10-CM | POA: Insufficient documentation

## 2014-12-03 DIAGNOSIS — E785 Hyperlipidemia, unspecified: Secondary | ICD-10-CM | POA: Insufficient documentation

## 2014-12-03 DIAGNOSIS — I5032 Chronic diastolic (congestive) heart failure: Secondary | ICD-10-CM | POA: Insufficient documentation

## 2014-12-03 DIAGNOSIS — J449 Chronic obstructive pulmonary disease, unspecified: Secondary | ICD-10-CM

## 2014-12-03 DIAGNOSIS — Z720 Tobacco use: Secondary | ICD-10-CM | POA: Insufficient documentation

## 2014-12-03 LAB — BASIC METABOLIC PANEL
Anion gap: 9 (ref 5–15)
BUN: 14 mg/dL (ref 6–20)
CO2: 21 mmol/L — AB (ref 22–32)
CREATININE: 0.91 mg/dL (ref 0.44–1.00)
Calcium: 9.4 mg/dL (ref 8.9–10.3)
Chloride: 109 mmol/L (ref 101–111)
GFR calc Af Amer: >60 mL/min (ref >60)
GFR calc non Af Amer: >60 mL/min (ref >60)
GLUCOSE: 96 mg/dL (ref 65–99)
Potassium: 4.3 mmol/L (ref 3.5–5.1)
Sodium: 139 mmol/L (ref 135–145)

## 2014-12-03 MED ORDER — FUROSEMIDE 40 MG PO TABS
ORAL_TABLET | ORAL | Status: DC
Start: 2014-12-03 — End: 2015-03-13

## 2014-12-03 MED ORDER — VARENICLINE TARTRATE 0.5 MG X 11 & 1 MG X 42 PO MISC: ORAL | Status: DC

## 2014-12-03 MED ORDER — POTASSIUM CHLORIDE ER 10 MEQ PO TBCR
EXTENDED_RELEASE_TABLET | ORAL | Status: DC
Start: 2014-12-03 — End: 2015-06-19

## 2014-12-03 NOTE — Progress Notes (Signed)
Patient ID: Morgan Bean, female   DOB: Aug 12, 1965, 49 y.o.   MRN: [id number] PCP: Dr. Doreene Burke  49 yo with history of diastolic CHF, tricuspid regurgitation, and nonobstructive CAD presents for followup.  She developed pancytopenia probably from a cocaine contaminant.  She has been hospitalized with infections in the setting of neutropenia.  She has been treated for syphilis.  She is no longer using cocaine.  She has atypical chest pain, and Cardiolite in 2/16 was low risk with no ischemia.  Last echo in 6/15 showed normal EF with diastolic dysfunction and moderate TR.   She continues to have exertional dyspnea after walking about 1/2 block or going up a flight of steps.  She is back to smoking about 1 ppd. BP is controlled.   Labs (9/12): HCT 31.5, BNP 132, K 4, creatinine 1.2 Labs (10/12): LDL 142, HDL 46, K 4.2, creatinine 1.3 Labs (4/14): K 4.3, creatinine 1.1, BNP 190, LDL 97, HDL 45 Labs (3/15): LDL 106, HDL 38 Labs (4/15): K 4, creatinine 0.8, WBCs 3.1, HCT 30.6, plts 157 Labs (3/16): K 3.6, creatinine 0.75, HIV negative, WBCs 3, HCT 29.8, plts 167  PMH: 1. Fe-deficiency anemia 2. HTN 3. Asthma 4. Active smoker 5. Prior cocaine 6. H/o CVA. ?TIA in 7/15.  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  ANA positive.  Thought to be secondary to cocaine use.   8. Pulmonary hypertension/tricuspid regurgitation: Echo (9/11) with EF 65%, mild LVH, mild AI, mild MR, moderate to possibly severe TR with PA systolic pressure 52 mmHg.  Echo (10/12): severe LV hypertrophy, EF 60-65%, mild MR, mild AI, moderate to severe tricuspid regurgitation, PA systolic pressure 38 mmHg.  RHC (10/12) with mean RA 4, PA 28/9, mean PCWP 9, CI 3.8.  Echo (4/14) with EF 65%, moderate LVH, mild AI, mild MR, mild TR.  Echo (6/15) with EF 60-65%, grade II diastolic dysfunction, mild MR, normal RV size and systolic function, moderate TR, mild AI.   9. Chest pain:  Lexiscan myoview (10/12) with significant ST depression upon Lexiscan injection but no ischemia or infarction on perfusion images.  Left heart cath (10/12): 30% mid LAD, 30% ostial D1, 50% ostial D2.  Cardiolite (2/16) with EF 64%, small mild basal-mid inferolateral fixed defect, low risk/probably attenuation.   10. Diastolic CHF: Echo (6/81) with EF 60-65%, moderate LVH, mild AI, mild MR.  11. COPD: At most mild.  PFTs in 6/14 with no obstructive by spirometry but mild air-trapping.  Mild restriction as well.  12. Pancytopenia: Bone marrow biopsy negative for lymphoma/leukemia.  Thought to be related to an agent in cocaine.  48. H/o syphilis  SH: Quit smoking in 3/15 but later restarted, has 1 daughter, unemployed.  Prior cocaine abuse.    FH: Grandmother with "heart problems."  Father with 2 open heart surgeries in his late 57s.  Brother with PCI at age 28.   ROS: All systems reviewed and negative except as per HPI.   Current Outpatient Prescriptions  Medication Sig Dispense Refill  . acetaminophen (TYLENOL) 325 MG tablet Take 2 tablets (650 mg total) by mouth every 6 (six) hours as needed for mild pain (or Fever >/= 101).    [name] albuterol (PROVENTIL HFA;VENTOLIN HFA) 108 (90 BASE) MCG/ACT inhaler Inhale 2 puffs into the lungs every 2 (two) hours as needed for wheezing or shortness of breath (cough). 3 Inhaler 3  . amLODipine (NORVASC) 10 MG tablet Take 1 tablet (10 mg total)  by mouth daily. 90 tablet 3  . atorvastatin (LIPITOR) 40 MG tablet TAKE 1 TABLET BY MOUTH IN THE EVENING 30 tablet 2  . buPROPion (WELLBUTRIN SR) 150 MG 12 hr tablet Take 150 mg by mouth 2 (two) times daily.    . citalopram (CELEXA) 40 MG tablet Take 1 tablet (40 mg total) by mouth daily. 30 tablet 1  . cloNIDine (CATAPRES) 0.2 MG tablet Take 1 tablet (0.2 mg total) by mouth 2 (two) times daily. 180 tablet 3  . clopidogrel (PLAVIX) 75 MG tablet Take 1 tablet (75 mg total) by mouth daily. 90 tablet 3  . ferrous sulfate 325  (65 FE) MG tablet Take 650 mg by mouth daily with breakfast.    . furosemide (LASIX) 40 MG tablet Take 40 mg in the AM (2 tabs) and take 20 mg in the PM (1 tab) 90 tablet 3  . hydrOXYzine (ATARAX/VISTARIL) 25 MG tablet Take 25 mg by mouth at bedtime as needed (sleep).    . loratadine (CLARITIN) 10 MG tablet Take 1 tablet (10 mg total) by mouth daily. 30 tablet 3  . Multiple Vitamin (MULTIVITAMIN WITH MINERALS) TABS Take 1 tablet by mouth daily.    [name] omeprazole (PRILOSEC) 40 MG capsule Take 1 capsule (40 mg total) by mouth daily. 30 capsule 2  . polyethylene glycol powder (GLYCOLAX/MIRALAX) powder Take 17 g by mouth daily as needed (constipation).     . potassium chloride (K-DUR) 10 MEQ tablet Take 2 tabs (20 meQ) twice a day 120 tablet 2  . traZODone (DESYREL) 100 MG tablet Take 1 tablet (100 mg total) by mouth at bedtime. 30 tablet 1  . varenicline (CHANTIX STARTING MONTH PAK) 0.5 MG X 11 & 1 MG X 42 tablet Take one 0.5 mg tablet by mouth once daily for 3 days, then increase to one 0.5 mg tablet twice daily for 4 days, then increase to one 1 mg tablet twice daily. 53 tablet 0   No current facility-administered medications for this encounter.    BP 116/70 mmHg  Pulse 83  Wt 212 lb 12 oz (96.503 kg)  SpO2 99%  LMP 04/24/2013 General: NAD Neck: JVP 8-9 cm, no thyromegaly or thyroid nodule.  Lungs: Slightly distant breath sounds.   CV: Nondisplaced PMI.  Heart regular S1/S2, no S3/S4, no murmur.  1+ ankle edema.  No carotid bruit.  Normal pedal pulses.  Abdomen: Soft, nontender, no hepatosplenomegaly, no distention.  Neurologic: Alert and oriented x 3.  Psych: Normal affect. Extremities: No clubbing or cyanosis.   Assessment/Plan:  1. Chronic diastolic CHF: NYHA class II-III symptoms.  On exam, she is volume overloaded.  She has a history of diastolic CHF and TR, TR was moderate on last echo.  She did not have pulmonary hypertension on prior RHC.  - Repeat echo. - Increase Lasix to 40  qam/20 qpm.  Increase KCl to 20 bid.  BMET/BNP today and BMET in 2 wks.   2. Hyperlipidemia: She is on a statin with nonobstructive coronary disease.   3. HTN: BP controlled.   4. Tricuspid regurgitation: Moderate on last echo.  Has not been documented to have significant pulmonary edema. As above, will repeat echo to reassess.  5. Smoking: She needs to quit.  Chantix worked in the past, I will prescribe it for her again.  She should have repeat PFTs given significant dyspnea with ongoing smoking.   Loralie Champagne 12/03/2014

## 2014-12-03 NOTE — Patient Instructions (Signed)
INCREASE LASIX TO 40 MG (2 TABS) IN THE AM AND 20 MG (1 TAB) IN THE PM INCREASE POTASSIUM TO 20 MEQ( 2 TABS) TWICE A DAY  LABS TODAY AND AGAIN IN 2 WEEKS (BMET)  Your physician has recommended that you have a pulmonary function test. Pulmonary Function Tests are a group of tests that measure how well air moves in and out of your lungs.  Your physician has requested that you have an echocardiogram. Echocardiography is a painless test that uses sound waves to create images of your heart. It provides your doctor with information about the size and shape of your heart and how well your heart's chambers and valves are working. This procedure takes approximately one hour. There are no restrictions for this procedure.  Your physician recommends that you schedule a follow-up appointment in: 3 MONTHS  Do the following things EVERYDAY: 1) Weigh yourself in the morning before breakfast. Write it down and keep it in a log. 2) Take your medicines as prescribed 3) Eat low salt foods-Limit salt (sodium) to 2000 mg per day.  4) Stay as active as you can everyday 5) Limit all fluids for the day to less than 2 liters 6)

## 2014-12-19 ENCOUNTER — Ambulatory Visit (HOSPITAL_COMMUNITY)
Admission: RE | Admit: 2014-12-19 | Discharge: 2014-12-19 | Disposition: A | Discharge status: Home / Self Care | Payer: Self-pay | Source: Ambulatory Visit | Attending: Cardiology | Admitting: Cardiology

## 2014-12-19 DIAGNOSIS — I5032 Chronic diastolic (congestive) heart failure: Secondary | ICD-10-CM

## 2014-12-19 DIAGNOSIS — I5022 Chronic systolic (congestive) heart failure: Secondary | ICD-10-CM

## 2014-12-19 DIAGNOSIS — J449 Chronic obstructive pulmonary disease, unspecified: Secondary | ICD-10-CM | POA: Insufficient documentation

## 2014-12-19 LAB — PULMONARY FUNCTION TEST
DL/VA % PRED: 92 %
DL/VA: 4.56 ml/min/mmHg/L
DLCO UNC: 12.18 ml/min/mmHg
DLCO unc % pred: 47 %
FEF 25-75 Post: 1.11 L/sec
FEF 25-75 Pre: 1.79 L/sec
FEF2575-%Change-Post: -38 %
FEF2575-%PRED-POST: 44 %
FEF2575-%Pred-Pre: 72 %
FEV1-%CHANGE-POST: -18 %
FEV1-%PRED-PRE: 76 %
FEV1-%Pred-Post: 62 %
FEV1-POST: 1.48 L
FEV1-PRE: 1.82 L
FEV1FVC-%Change-Post: -6 %
FEV1FVC-%Pred-Pre: 98 %
FEV6-%Change-Post: -12 %
FEV6-%PRED-POST: 68 %
FEV6-%Pred-Pre: 78 %
FEV6-POST: 1.99 L
FEV6-PRE: 2.28 L
FEV6FVC-%CHANGE-POST: 0 %
FEV6FVC-%PRED-PRE: 102 %
FEV6FVC-%Pred-Post: 103 %
FVC-%CHANGE-POST: -12 %
FVC-%PRED-POST: 66 %
FVC-%PRED-PRE: 76 %
FVC-POST: 1.99 L
FVC-PRE: 2.28 L
POST FEV1/FVC RATIO: 74 %
PRE FEV6/FVC RATIO: 100 %
Post FEV6/FVC ratio: 100 %
Pre FEV1/FVC ratio: 80 %
RV % PRED: 114 %
RV: 2.13 L
TLC % pred: 83 %
TLC: 4.36 L

## 2014-12-19 MED ORDER — ALBUTEROL SULFATE (2.5 MG/3ML) 0.083% IN NEBU
2.5000 mg | INHALATION_SOLUTION | Freq: Once | RESPIRATORY_TRACT | Status: AC
  Administered 2014-12-19: 2.5 mg via RESPIRATORY_TRACT

## 2014-12-26 ENCOUNTER — Other Ambulatory Visit (INDEPENDENT_AMBULATORY_CARE_PROVIDER_SITE_OTHER): Payer: No Typology Code available for payment source

## 2014-12-26 ENCOUNTER — Encounter: Payer: Self-pay | Admitting: Neurology

## 2014-12-26 ENCOUNTER — Ambulatory Visit (INDEPENDENT_AMBULATORY_CARE_PROVIDER_SITE_OTHER): Payer: Self-pay | Admitting: Neurology

## 2014-12-26 VITALS — BP 98/72 | HR 77 | Resp 16 | Ht 66.0 in | Wt 211.0 lb

## 2014-12-26 DIAGNOSIS — I672 Cerebral atherosclerosis: Secondary | ICD-10-CM

## 2014-12-26 DIAGNOSIS — Z8673 Personal history of transient ischemic attack (TIA), and cerebral infarction without residual deficits: Secondary | ICD-10-CM

## 2014-12-26 DIAGNOSIS — G4485 Primary stabbing headache: Secondary | ICD-10-CM

## 2014-12-26 DIAGNOSIS — R4781 Slurred speech: Secondary | ICD-10-CM

## 2014-12-26 LAB — SEDIMENTATION RATE: Sed Rate: 52 mm/hr — ABNORMAL HIGH (ref 0–22)

## 2014-12-26 MED ORDER — TOPIRAMATE 25 MG PO TABS: ORAL_TABLET | ORAL | Status: DC

## 2014-12-26 NOTE — Patient Instructions (Addendum)
1. Bloodwork for ESR 2. Schedule MRI brain without contrast to rule out new stroke 3. Start Topamax $RemoveBefor'25mg'[id number]$ : take 1 tablet at night for 1 week, then increase to 2 tablets at night 4. If symptoms worsen, go to ER immediately 5. Follow-up in 2 months, call for any changes

## 2014-12-26 NOTE — Progress Notes (Signed)
NEUROLOGY FOLLOW UP OFFICE NOTE  Morgan Bean [id number]  HISTORY OF PRESENT ILLNESS: I had the pleasure of seeing Morgan Bean in follow-up in the neurology clinic on 12/26/2014.  The patient was last seen 8 months ago for increased headaches and an episode of expressive aphasia with slurred speech last May 2015. MRI brain did not show any acute stroke. She does have intracranial atherosclerosis. She was switched to Plavix and had been doing well with no further symptoms until 1 month ago when she started having frequent stabbing pain over the left temporal region. These would last 5 seconds or so but occurring multiple times every other day. No vision changes, nausea, vomiting, photo/phonophobia. She has also been having slurred speech especially upon awakening, then improving over the course of the day. She denies any diplopia, dysphagia, focal numbness/tingling/weakness. No dizziness, but she feels unstable at times, no falls. She denies any changes in the past month, sleep is good, usually 6 hours of restful sleep. The only change in the past month was her doses of Lasix and potassium.  HPI: This is a pleasant 49 yo RH woman with a history of hypertension, hyperlipidemia, left pontine stroke in 2009, leukopenia and anemia, history of cocaine abuse (last use November 2014), syphilis, who presented with headaches and a transient episode of expressive aphasia. She went to the ER on 08/06/13 for 10/10 headache and speech difficulties where she reported word-finding difficulties (per daughter "not making sense") because she couldn't say the right word and it took a long time to get a word out. Her speech was also slurred. She denied any difficulties with comprehension. CBC, CMP, and head CT done were unremarkable. She was discharged home to follow-up with Neurology. She reports the speech difficulties lasted for 24 hours. She continued to have daily headaches occuring twice a day, lasting 5 minutes or  so, 3/10 in intensity, with throbbing over either temporal region, or a cap-like pressure over the top of her head. She does not take any medication, stating the headaches are not bad, but are concerning because she has never had daily headaches in the past. There is no associated nausea, vomiting, photo/phonophobia, no focal numbness/tingling/weakness.   In 2009, she suddenly fell and noted right hand weakness and slurred speech. I personally reviewed MRI brain from 2009 which showed restricted diffusion in the left pons and inferior mid brain without mass effect or hemorrhage. There was moderate white matter disease advanced for age. It was noted that the main differential diagnosis in a patient of this age is multiple sclerosis. Her MRA showed widespread intracranial atherosclerosis, basilar tip is ectatic measuring roughly 3 x 6.5 mm, without saccular aneurysm formation, mild to moderate stenoses of the left MCA origin, right MCA trifurcation and left ICA pre cavernous segment are noted. She was discharged on aspirin. She had left arm pain last year and tells me she was started on Lipitor at that time. She stopped smoking in March 2015 after smoking 1/2 PPD since age 7. There is no family history of headaches or aneurysm.  Diagnostic Data: I personally reviewed MRI brain with and without contrast done 09/2013 which showed generalized cerebral atrophy, advanced for patient age, extensive T2/FLAIR hyperintensity within the periventricular and deep white  matter both cerebral hemispheres. There is a remote lacunar infarct in the white matter of the corona radiata of both cerebral hemispheres, remote lacunar infarct present within the left pons. Her MRA head was abnormal with multifocal atherosclerotic stenosis in the cavernous  right ICA, measuring up to 50-60% by NASCET criteria, short segment stenosis of approximately 30-40% within the pre cavernous left ICA, smooth atherosclerotic stenosis of  approximately 30-40% within the cavernous left ICA, additional widespread multi focal intracranial atherosclerosis, predominantly involving the in M1 segments and basilar artery. Her MRI C-spine did not show any myelopathy, there was spondylosis with mild to moderate foraminal narrowing at C5-6 bilaterally. There was note of persistent upper cervical adenopathy and mucosal edema with heterogeneous enhancement, appearing slightly improved and presumably inflammatory.   PAST MEDICAL HISTORY: Past Medical History  Diagnosis Date  . Coronary artery disease     Lexiscan myoview (10/12) with significant ST depression upon Lexiscan injection but no ischemia or infarction on perfusion images. Left heart cath (10/12): 30% mid LAD, 30% ostial D1, 50% ostial D2.    [name] Hypertension   . Tricuspid regurgitation   . Iron deficiency     hx of  . Polysubstance abuse     Prior cocaine  . Tobacco abuse   . Leukocytoclastic vasculitis ([place])     Rash across lower body, occurred in 9/11, diagnosed by skin biopsy. ANA positive. Thought to be secondary to cocaine use.  . Pulmonary HTN ([place])     Echo (9/11) with EF 65%, mild LVH, mild AI, mild MR, moderate to possibly severe TR with PA systolic pressure 52 mmHg. Echo (10/12): severe LV hypertrophy, EF 60-65%, mild MR, mild AI, moderate to severe tricuspid regurgitation, PA systolic pressure 38 mmHg.  Echo 4/14: moderate LVH, EF 65%, normal wall motion, diastolic dysfunction, mild AI, mild MR  . Asthma   . Shortness of breath     a. PFTs 5/14:  FEV1/FVC 99% predicted; FVC 60% predicted; DLCO mildly reduced, mild restriction, air trapping.;  b.  seen by pulmo (Dr. [name]) 5/14: mainly upper airway symptoms - ACE d/c'd (sx's better)  . Chronic diastolic CHF (congestive heart failure) ([place])   . Constipation   . Lymphadenopathy 03/21/2013  . Leukopenia 03/21/2013  . Unspecified deficiency anemia 03/29/2013  . Anginal pain ([place])     none in past year  . Anxiety   .  Mental disorder   . Depression   . Stroke ([place]) 2010ish  . Arthritis   . Pancytopenia ([place])   . Hyperlipemia   . Syphilis 04/25/2013  . Candidiasis 06/23/2013  . Anemia of other chronic disease 11/13/2013  . Cough 11/13/2013  . Lymphadenopathy 01/30/2014  . Headache 02/22/2014  . Hx of cardiovascular stress test     Lexiscan Myoview (2/16): Breast attenuation artifact, no ischemia, EF 64%; low risk    MEDICATIONS: Current Outpatient Prescriptions on File Prior to Visit  Medication Sig Dispense Refill  . acetaminophen (TYLENOL) 325 MG tablet Take 2 tablets (650 mg total) by mouth every 6 (six) hours as needed for mild pain (or Fever >/= 101).    [name] albuterol (PROVENTIL HFA;VENTOLIN HFA) 108 (90 BASE) MCG/ACT inhaler Inhale 2 puffs into the lungs every 2 (two) hours as needed for wheezing or shortness of breath (cough). 3 Inhaler 3  . amLODipine (NORVASC) 10 MG tablet Take 1 tablet (10 mg total) by mouth daily. 90 tablet 3  . atorvastatin (LIPITOR) 40 MG tablet TAKE 1 TABLET BY MOUTH IN THE EVENING 30 tablet 2  . buPROPion (WELLBUTRIN SR) 150 MG 12 hr tablet Take 150 mg by mouth 2 (two) times daily.    . citalopram (CELEXA) 40 MG tablet Take 1 tablet (40 mg total) by mouth daily. 30 tablet  1  . cloNIDine (CATAPRES) 0.2 MG tablet Take 1 tablet (0.2 mg total) by mouth 2 (two) times daily. 180 tablet 3  . clopidogrel (PLAVIX) 75 MG tablet Take 1 tablet (75 mg total) by mouth daily. 90 tablet 3  . ferrous sulfate 325 (65 FE) MG tablet Take 650 mg by mouth daily with breakfast.    . furosemide (LASIX) 40 MG tablet Take 40 mg in the AM (2 tabs) and take 20 mg in the PM (1 tab) 90 tablet 3  . hydrOXYzine (ATARAX/VISTARIL) 25 MG tablet Take 25 mg by mouth at bedtime as needed (sleep).    . loratadine (CLARITIN) 10 MG tablet Take 1 tablet (10 mg total) by mouth daily. 30 tablet 3  . Multiple Vitamin (MULTIVITAMIN WITH MINERALS) TABS Take 1 tablet by mouth daily.    [name] omeprazole (PRILOSEC) 40 MG capsule  Take 1 capsule (40 mg total) by mouth daily. 30 capsule 2  . polyethylene glycol powder (GLYCOLAX/MIRALAX) powder Take 17 g by mouth daily as needed (constipation).     . potassium chloride (K-DUR) 10 MEQ tablet Take 2 tabs (20 meQ) twice a day 120 tablet 2  . traZODone (DESYREL) 100 MG tablet Take 1 tablet (100 mg total) by mouth at bedtime. 30 tablet 1  . varenicline (CHANTIX STARTING MONTH PAK) 0.5 MG X 11 & 1 MG X 42 tablet Take one 0.5 mg tablet by mouth once daily for 3 days, then increase to one 0.5 mg tablet twice daily for 4 days, then increase to one 1 mg tablet twice daily. (Patient not taking: Reported on 12/26/2014) 53 tablet 0   No current facility-administered medications on file prior to visit.    ALLERGIES: Allergies  Allergen Reactions  . Ciprofloxacin Itching    FAMILY HISTORY: Family History  Problem Relation Age of Onset  . Coronary artery disease Mother   . Diabetes Mother   . Breast cancer Maternal Grandmother   . Diabetes Maternal Grandmother   . Diabetes Father   . Hypertension Father   . Coronary artery disease Father   . Heart attack Maternal Grandmother   . Stroke Maternal Grandmother   . Heart attack Father     SOCIAL HISTORY: Social History   Social History  . Marital Status: Single    Spouse Name: N/A  . Number of Children: 2  . Years of Education: N/A   Occupational History  . unemployed    Social History Main Topics  . Smoking status: Current Every Day Smoker -- 0.25 packs/day for 30 years    Types: Cigarettes  . Smokeless tobacco: Never Used  . Alcohol Use: No     Comment: pint of wine 2-3 times a week--ocassional - quit 2012  . Drug Use: 1.00 per week    Special: "Crack" cocaine     Comment: none since 02/04/14  . Sexual Activity: Yes   Other Topics Concern  . Not on file   Social History Narrative   Lives with mom, sisters and brothers.    REVIEW OF SYSTEMS: Constitutional: No fevers, chills, or sweats, no generalized  fatigue, change in appetite Eyes: No visual changes, double vision, eye pain Ear, nose and throat: No hearing loss, ear pain, nasal congestion, sore throat Cardiovascular: No chest pain, palpitations Respiratory:  No shortness of breath at rest or with exertion, wheezes GastrointestinaI: No nausea, vomiting, diarrhea, abdominal pain, fecal incontinence Genitourinary:  No dysuria, urinary retention or frequency Musculoskeletal:  No neck pain, back pain Integumentary:  No rash, pruritus, skin lesions Neurological: as above Psychiatric: No depression, insomnia, anxiety Endocrine: No palpitations, fatigue, diaphoresis, mood swings, change in appetite, change in weight, increased thirst Hematologic/Lymphatic:  No anemia, purpura, petechiae. Allergic/Immunologic: no itchy/runny eyes, nasal congestion, recent allergic reactions, rashes  PHYSICAL EXAM: Filed Vitals:   12/26/14 0807  BP: 98/72  Pulse: 77  Resp: 16   General: No acute distress Head:  Normocephalic/atraumatic Neck: supple, no paraspinal tenderness, full range of motion Heart:  Regular rate and rhythm Lungs:  Clear to auscultation bilaterally Back: No paraspinal tenderness Skin/Extremities: No rash, no edema Neurological Exam: alert and oriented to person, place, and time, + mild dysarthria, no aphasia, Fund of knowledge is appropriate. Recent and remote memory are intact. Attention and concentration are normal. Able to name objects and repeat phrases.  Cranial nerves:  CN I: not tested  CN II: pupils equal, round and reactive to light, visual fields intact, fundi unremarkable.  CN III, IV, VI: full range of motion, no nystagmus, no ptosis  CN V: facial sensation intact  CN VII: upper and lower face symmetric  CN VIII: hearing intact to finger rub  CN IX, X: gag intact, uvula midline  CN XI: sternocleidomastoid and trapezius muscles intact  CN XII: tongue midline  Bulk & Tone: normal, no fasciculations.   Motor: 5/5 throughout with no pronator drift.  Sensation: intact to light touch. No extinction to double simultaneous stimulation. Romberg test negative Deep Tendon Reflexes: brisk +3 throughout with bilateral Hoffman's sign, hyperactive pectoralis reflex, and 5-beats unsustained clonus on right ankle, 3-beats on left ankle (similar to prior) Plantar responses: downgoing bilaterally  Cerebellar: no incoordination on finger to nose Gait: narrow-based and steady, slight difficulty with tandem walk but able (similar to prior) Tremor: none   IMPRESSION: This is a pleasant 49 yo RH woman with a history of hypertension, hyperlipidemia, syphilis, cocaine abuse (last use 01/2013), left pontine stroke in 2009 with intracranial atherosclerosis and white matter changes advanced for age again noted on repeat imaging July 2015 after she had new onset daily headaches and an episode suggestive of expressive aphasia with slurred speech last 08/06/13, concerning for TIA. No acute changes on MRI.She is now on Plavix for secondary stroke prevention and continues on statin therapy. She had been doing well until the past month when she started having slurred speech again, as well as recurrent stabbing pain on the left temporal region (different from initial headaches she previously reported). With intracranial atherosclerosis, she does have increased risk for stroke, a repeat MRI brain without contrast will be ordered. Headaches suggestive of primary stabbing headaches, less likely temporal arteritis, however an ESR will be checked. She will start daily headache preventative medication, options discussed, she will take Topamax $RemoveBefo'25mg'zEfgyKjenbr$  daily for 1 week, then increase to $RemoveBef'50mg'[id number]$  daily. If ineffective, we will try Indomethacin in the future. Side effects were discussed. She knows to go to the ER if symptoms recur or progress. She will follow-up in 2 months and knows to call our office for any changes. \  Thank you for allowing  me to participate in her care.  Please do not hesitate to call for any questions or concerns.  The duration of this appointment visit was 25 minutes of face-to-face time with the patient.  Greater than 50% of this time was spent in counseling, explanation of diagnosis, planning of further management, and coordination of care.   Ellouise Newer, M.D.   CC: Dr. Doreene Burke

## 2014-12-27 ENCOUNTER — Encounter: Payer: Self-pay | Admitting: Hematology and Oncology

## 2014-12-27 ENCOUNTER — Telehealth: Payer: Self-pay | Admitting: Hematology and Oncology

## 2014-12-27 ENCOUNTER — Telehealth: Payer: Self-pay | Admitting: *Deleted

## 2014-12-27 ENCOUNTER — Ambulatory Visit (HOSPITAL_BASED_OUTPATIENT_CLINIC_OR_DEPARTMENT_OTHER): Payer: No Typology Code available for payment source | Admitting: Hematology and Oncology

## 2014-12-27 ENCOUNTER — Other Ambulatory Visit (HOSPITAL_BASED_OUTPATIENT_CLINIC_OR_DEPARTMENT_OTHER): Payer: No Typology Code available for payment source

## 2014-12-27 VITALS — BP 105/75 | HR 78 | Temp 97.7°F | Resp 19 | Ht 66.0 in | Wt 210.3 lb

## 2014-12-27 DIAGNOSIS — Z299 Encounter for prophylactic measures, unspecified: Secondary | ICD-10-CM | POA: Insufficient documentation

## 2014-12-27 DIAGNOSIS — D708 Other neutropenia: Secondary | ICD-10-CM

## 2014-12-27 DIAGNOSIS — D709 Neutropenia, unspecified: Secondary | ICD-10-CM

## 2014-12-27 DIAGNOSIS — D61818 Other pancytopenia: Secondary | ICD-10-CM

## 2014-12-27 DIAGNOSIS — D638 Anemia in other chronic diseases classified elsewhere: Secondary | ICD-10-CM

## 2014-12-27 DIAGNOSIS — Z23 Encounter for immunization: Secondary | ICD-10-CM

## 2014-12-27 DIAGNOSIS — K029 Dental caries, unspecified: Secondary | ICD-10-CM

## 2014-12-27 DIAGNOSIS — D641 Secondary sideroblastic anemia due to disease: Secondary | ICD-10-CM

## 2014-12-27 DIAGNOSIS — Z1211 Encounter for screening for malignant neoplasm of colon: Secondary | ICD-10-CM | POA: Insufficient documentation

## 2014-12-27 DIAGNOSIS — Z720 Tobacco use: Secondary | ICD-10-CM

## 2014-12-27 LAB — MORPHOLOGY: PLT EST: ADEQUATE

## 2014-12-27 LAB — CBC WITH DIFFERENTIAL/PLATELET
BASO%: 0.4 % (ref 0.0–2.0)
Basophils Absolute: 0 10*3/uL (ref 0.0–0.1)
EOS ABS: 0.1 10*3/uL (ref 0.0–0.5)
EOS%: 4 % (ref 0.0–7.0)
HCT: 33.6 % — ABNORMAL LOW (ref 34.8–46.6)
HGB: 10.7 g/dL — ABNORMAL LOW (ref 11.6–15.9)
LYMPH%: 55.8 % — ABNORMAL HIGH (ref 14.0–49.7)
MCH: 25.8 pg (ref 25.1–34.0)
MCHC: 31.8 g/dL (ref 31.5–36.0)
MCV: 81 fL (ref 79.5–101.0)
MONO#: 0.5 10*3/uL (ref 0.1–0.9)
MONO%: 19.3 % — AB (ref 0.0–14.0)
NEUT%: 20.5 % — ABNORMAL LOW (ref 38.4–76.8)
NEUTROS ABS: 0.6 10*3/uL — AB (ref 1.5–6.5)
NRBC: 0 % (ref 0–0)
PLATELETS: 169 10*3/uL (ref 145–400)
RBC: 4.15 10*6/uL (ref 3.70–5.45)
RDW: 16.6 % — AB (ref 11.2–14.5)
WBC: 2.7 10*3/uL — AB (ref 3.9–10.3)
lymph#: 1.5 10*3/uL (ref 0.9–3.3)

## 2014-12-27 MED ORDER — INFLUENZA VAC SPLIT QUAD 0.5 ML IM SUSY
0.5000 mL | PREFILLED_SYRINGE | Freq: Once | INTRAMUSCULAR | Status: AC
  Administered 2014-12-27: 0.5 mL via INTRAMUSCULAR
  Filled 2014-12-27: qty 0.5

## 2014-12-27 NOTE — Assessment & Plan Note (Signed)
She has persistent, severe neutropenia, unresponsive to prednisone and recent G-CSF. She had multiple bone marrow biopsy performed in the past and overall consensus from multiple subspecialties were this is related to cocaine abuse. The patient denies cocaine abuse for over 6 months. Previously, she had nonspecific lymphadenopathy and lymph node biopsy was non-diagnostic. Repeat CT scan show nonspecific lymphadenopathy that overall is improving. After review of her case at the most recent hematology tumor board, I recommend a trial of IVIG.  She has no response to IVIG. We discussed other options including rituximab, observation, or referral to tertiary center. Due to lack of symptoms, the patient elected for observation only and I think it is reasonable. She would like second opinion at tertiary center and I will arrange for referral. I will see her back in 6 months with repeat blood work history and physical examination. I reinforced the importance of hand hygiene and close communication with me. Should she suspect signs of infection, she will call me immediately for further evaluation and management.

## 2014-12-27 NOTE — Assessment & Plan Note (Signed)
The patient is attempting to quit smoking. She was started on Chantix.

## 2014-12-27 NOTE — Telephone Encounter (Signed)
Called in referral to [facility] for second opinion Chronic neutropenia.  December 5th with Dr Griffin Basil. Will be on waiting list for sooner.

## 2014-12-27 NOTE — Assessment & Plan Note (Signed)
She has significant dental caries. This is likely going to be the source of infection. I recommend increase oral hygiene and dental appointments.

## 2014-12-27 NOTE — Assessment & Plan Note (Signed)
We discussed the importance of preventive care and reviewed the vaccination programs. She does not have any prior allergic reactions to influenza vaccination. She agrees to proceed with influenza vaccination today and we will administer it today at the clinic.  

## 2014-12-27 NOTE — Telephone Encounter (Signed)
Gave and printed appt sched and avs for pt for April 2017 °

## 2014-12-27 NOTE — Progress Notes (Signed)
[place] OFFICE PROGRESS NOTE  Morgan Chessman, MD SUMMARY OF HEMATOLOGIC HISTORY:  This is a pleasant lady who is being referred here because of background of severe pancytopenia, lymphadenopathy and abnormal lymph node and bone marrow biopsy. Further evaluation suspect the patient may have syphilis. She was treated with doxycycline with worsening pancytopenia. Her antibiotic treatment was discontinued. From 06/02/2013 to 06/09/2013, the patient was admitted to the hospital for further workup for neutropenic fever. She was placed on G-CSF with no response to therapy. Repeat bone marrow biopsy was done. The patient was given broad-spectrum intravenous antibiotics and subsequently discharged for further followup as an outpatient.  On 06/14/2013, I started her on prednisone 60 mg daily. She was readmitted to the hospital again for neutropenic fever. 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 On 08/04/2013, I advised further prednisone taper. She is given G-CSF for severe neutropenia with no response. She was subsequently observed. In August 2015, she was noted to a recurrence of severe neutropenia. She was sent back on prednisone with initial good response to treatment but then relapsed soon after initiation of prednisone taper  on 02/20/2014, she was given IVIG treatment. On 03/06/2014, prednisone taper is initiated to 2.5 mg daily, subsequently taper off. INTERVAL HISTORY: Morgan Bean 49 y.o. female returns for further follow-up. She feels well. Denies recent infection. No new lymphadenopathy. She denies using any illegal drugs or substance abuse.  I have reviewed the past medical history, past surgical history, social history and family history with the patient and they are unchanged from previous note.  ALLERGIES:  is allergic to ciprofloxacin.  MEDICATIONS:  Current Outpatient Prescriptions  Medication Sig  Dispense Refill  . acetaminophen (TYLENOL) 325 MG tablet Take 2 tablets (650 mg total) by mouth every 6 (six) hours as needed for mild pain (or Fever >/= 101).    [name] albuterol (PROVENTIL HFA;VENTOLIN HFA) 108 (90 BASE) MCG/ACT inhaler Inhale 2 puffs into the lungs every 2 (two) hours as needed for wheezing or shortness of breath (cough). 3 Inhaler 3  . amLODipine (NORVASC) 10 MG tablet Take 1 tablet (10 mg total) by mouth daily. 90 tablet 3  . atorvastatin (LIPITOR) 40 MG tablet TAKE 1 TABLET BY MOUTH IN THE EVENING 30 tablet 2  . buPROPion (WELLBUTRIN SR) 150 MG 12 hr tablet Take 150 mg by mouth 2 (two) times daily.    . citalopram (CELEXA) 40 MG tablet Take 1 tablet (40 mg total) by mouth daily. 30 tablet 1  . cloNIDine (CATAPRES) 0.2 MG tablet Take 1 tablet (0.2 mg total) by mouth 2 (two) times daily. 180 tablet 3  . clopidogrel (PLAVIX) 75 MG tablet Take 1 tablet (75 mg total) by mouth daily. 90 tablet 3  . ferrous sulfate 325 (65 FE) MG tablet Take 650 mg by mouth daily with breakfast.    . furosemide (LASIX) 40 MG tablet Take 40 mg in the AM (2 tabs) and take 20 mg in the PM (1 tab) 90 tablet 3  . hydrOXYzine (ATARAX/VISTARIL) 25 MG tablet Take 25 mg by mouth at bedtime as needed (sleep).    . loratadine (CLARITIN) 10 MG tablet Take 1 tablet (10 mg total) by mouth daily. 30 tablet 3  . Multiple Vitamin (MULTIVITAMIN WITH MINERALS) TABS Take 1 tablet by mouth daily.    [name] omeprazole (PRILOSEC) 40 MG capsule Take 1 capsule (40 mg total) by mouth daily. 30 capsule 2  . polyethylene  glycol powder (GLYCOLAX/MIRALAX) powder Take 17 g by mouth daily as needed (constipation).     . potassium chloride (K-DUR) 10 MEQ tablet Take 2 tabs (20 meQ) twice a day 120 tablet 2  . topiramate (TOPAMAX) 25 MG tablet Take 1 tablet at night for 1 week, then increase to 2 tablets at night 60 tablet 4  . traZODone (DESYREL) 100 MG tablet Take 1 tablet (100 mg total) by mouth at bedtime. 30 tablet 1  . varenicline  (CHANTIX STARTING MONTH PAK) 0.5 MG X 11 & 1 MG X 42 tablet Take one 0.5 mg tablet by mouth once daily for 3 days, then increase to one 0.5 mg tablet twice daily for 4 days, then increase to one 1 mg tablet twice daily. (Patient not taking: Reported on 12/27/2014) 53 tablet 0   No current facility-administered medications for this visit.     REVIEW OF SYSTEMS:   Constitutional: Denies fevers, chills or night sweats Eyes: Denies blurriness of vision Ears, nose, mouth, throat, and face: Denies mucositis or sore throat Respiratory: Denies cough, dyspnea or wheezes Cardiovascular: Denies palpitation, chest discomfort or lower extremity swelling Gastrointestinal:  Denies nausea, heartburn or change in bowel habits Skin: Denies abnormal skin rashes Lymphatics: Denies new lymphadenopathy or easy bruising Neurological:Denies numbness, tingling or new weaknesses Behavioral/Psych: Mood is stable, no new changes  All other systems were reviewed with the patient and are negative.  PHYSICAL EXAMINATION: ECOG PERFORMANCE STATUS: 0 - Asymptomatic  Filed Vitals:   12/27/14 0827  BP: 105/75  Pulse: 78  Temp: 97.7 F (36.5 C)  Resp: 19   Filed Weights   12/27/14 0827  Weight: 210 lb 4.8 oz (95.391 kg)    GENERAL:alert, no distress and comfortable SKIN: skin color, texture, turgor are normal, no rashes or significant lesions EYES: normal, Conjunctiva are pink and non-injected, sclera clear OROPHARYNX:no exudate, no erythema and lips, buccal mucosa, and tongue normal . Poor dentition is noted  NECK: supple, thyroid normal size, non-tender, without nodularity LYMPH: palpable small lymph node in the neck, unchanged compared to baseline exam LUNGS: clear to auscultation and percussion with normal breathing effort HEART: regular rate & rhythm and no murmurs and no lower extremity edema ABDOMEN:abdomen soft, non-tender and normal bowel sounds Musculoskeletal:no cyanosis of digits and no clubbing   NEURO: alert & oriented x 3 with fluent speech, no focal motor/sensory deficits  LABORATORY DATA:  I have reviewed the data as listed Results for orders placed or performed in visit on 12/27/14 (from the past 48 hour(s))  Morphology     Status: None   Collection Time: 12/27/14  8:17 AM  Result Value Ref Range   Polychromasia Slight Slight   White Cell Comments C/W auto diff    PLT EST Adequate Adequate  CBC with Differential/Platelet     Status: Abnormal   Collection Time: 12/27/14  8:17 AM  Result Value Ref Range   WBC 2.7 (L) 3.9 - 10.3 10e3/uL   NEUT# 0.6 (L) 1.5 - 6.5 10e3/uL   HGB 10.7 (L) 11.6 - 15.9 g/dL   HCT 33.6 (L) 34.8 - 46.6 %   Platelets 169 145 - 400 10e3/uL   MCV 81.0 79.5 - 101.0 fL   MCH 25.8 25.1 - 34.0 pg   MCHC 31.8 31.5 - 36.0 g/dL   RBC 4.15 3.70 - 5.45 10e6/uL   RDW 16.6 (H) 11.2 - 14.5 %   lymph# 1.5 0.9 - 3.3 10e3/uL   MONO# 0.5 0.1 - 0.9 10e3/uL  Eosinophils Absolute 0.1 0.0 - 0.5 10e3/uL   Basophils Absolute 0.0 0.0 - 0.1 10e3/uL   NEUT% 20.5 (L) 38.4 - 76.8 %   LYMPH% 55.8 (H) 14.0 - 49.7 %   MONO% 19.3 (H) 0.0 - 14.0 %   EOS% 4.0 0.0 - 7.0 %   BASO% 0.4 0.0 - 2.0 %   nRBC 0 0 - 0 %    Lab Results  Component Value Date   WBC 2.7* 12/27/2014   HGB 10.7* 12/27/2014   HCT 33.6* 12/27/2014   MCV 81.0 12/27/2014   PLT 169 12/27/2014    ASSESSMENT & PLAN:  Neutropenia She has persistent, severe neutropenia, unresponsive to prednisone and recent G-CSF. She had multiple bone marrow biopsy performed in the past and overall consensus from multiple subspecialties were this is related to cocaine abuse. The patient denies cocaine abuse for over 6 months. Previously, she had nonspecific lymphadenopathy and lymph node biopsy was non-diagnostic. Repeat CT scan show nonspecific lymphadenopathy that overall is improving. After review of her case at the most recent hematology tumor board, I recommend a trial of IVIG.  She has no response to IVIG. We  discussed other options including rituximab, observation, or referral to tertiary center. Due to lack of symptoms, the patient elected for observation only and I think it is reasonable. She would like second opinion at tertiary center and I will arrange for referral. I will see her back in 6 months with repeat blood work history and physical examination. I reinforced the importance of hand hygiene and close communication with me. Should she suspect signs of infection, she will call me immediately for further evaluation and management.  Dental caries She has significant dental caries. This is likely going to be the source of infection. I recommend increase oral hygiene and dental appointments.    Anemia in chronic illness This is likely anemia of chronic disease. The patient denies recent history of bleeding such as epistaxis, hematuria or hematochezia. She is asymptomatic from the anemia. We will observe for now.   Tobacco abuse The patient is attempting to quit smoking. She was started on Chantix.  Preventive measure We discussed the importance of preventive care and reviewed the vaccination programs. She does not have any prior allergic reactions to influenza vaccination. She agrees to proceed with influenza vaccination today and we will administer it today at the clinic.    All questions were answered. The patient knows to call the clinic with any problems, questions or concerns. No barriers to learning was detected.  I spent 20 minutes counseling the patient face to face. The total time spent in the appointment was 25 minutes and more than 50% was on counseling.     Morgan Fiorenza, MD 10/6/201610:06 AM

## 2014-12-27 NOTE — Assessment & Plan Note (Signed)
This is likely anemia of chronic disease. The patient denies recent history of bleeding such as epistaxis, hematuria or hematochezia. She is asymptomatic from the anemia. We will observe for now.  

## 2014-12-31 ENCOUNTER — Ambulatory Visit: Payer: No Typology Code available for payment source | Attending: Internal Medicine | Admitting: Internal Medicine

## 2014-12-31 ENCOUNTER — Encounter: Payer: Self-pay | Admitting: Internal Medicine

## 2014-12-31 VITALS — BP 117/82 | HR 76 | Temp 98.0°F | Resp 16 | Ht 66.0 in | Wt 212.0 lb

## 2014-12-31 DIAGNOSIS — E785 Hyperlipidemia, unspecified: Secondary | ICD-10-CM | POA: Insufficient documentation

## 2014-12-31 DIAGNOSIS — I2510 Atherosclerotic heart disease of native coronary artery without angina pectoris: Secondary | ICD-10-CM | POA: Insufficient documentation

## 2014-12-31 DIAGNOSIS — Z79899 Other long term (current) drug therapy: Secondary | ICD-10-CM | POA: Insufficient documentation

## 2014-12-31 DIAGNOSIS — I110 Hypertensive heart disease with heart failure: Secondary | ICD-10-CM | POA: Insufficient documentation

## 2014-12-31 DIAGNOSIS — F17200 Nicotine dependence, unspecified, uncomplicated: Secondary | ICD-10-CM

## 2014-12-31 DIAGNOSIS — D709 Neutropenia, unspecified: Secondary | ICD-10-CM | POA: Insufficient documentation

## 2014-12-31 DIAGNOSIS — I5032 Chronic diastolic (congestive) heart failure: Secondary | ICD-10-CM | POA: Insufficient documentation

## 2014-12-31 DIAGNOSIS — Z8673 Personal history of transient ischemic attack (TIA), and cerebral infarction without residual deficits: Secondary | ICD-10-CM | POA: Insufficient documentation

## 2014-12-31 DIAGNOSIS — Z8249 Family history of ischemic heart disease and other diseases of the circulatory system: Secondary | ICD-10-CM | POA: Insufficient documentation

## 2014-12-31 DIAGNOSIS — Z833 Family history of diabetes mellitus: Secondary | ICD-10-CM | POA: Insufficient documentation

## 2014-12-31 DIAGNOSIS — J45909 Unspecified asthma, uncomplicated: Secondary | ICD-10-CM | POA: Insufficient documentation

## 2014-12-31 DIAGNOSIS — J449 Chronic obstructive pulmonary disease, unspecified: Secondary | ICD-10-CM | POA: Insufficient documentation

## 2014-12-31 DIAGNOSIS — F17210 Nicotine dependence, cigarettes, uncomplicated: Secondary | ICD-10-CM | POA: Insufficient documentation

## 2014-12-31 DIAGNOSIS — Z7902 Long term (current) use of antithrombotics/antiplatelets: Secondary | ICD-10-CM | POA: Insufficient documentation

## 2014-12-31 MED ORDER — VARENICLINE TARTRATE 0.5 MG X 11 & 1 MG X 42 PO MISC: ORAL | Status: DC

## 2014-12-31 NOTE — Progress Notes (Signed)
Patient here for her primary dr to write prescription for chantix Patient will be using pass program and that is one of the requirements Patient received her flu vaccine at the cancer center last week

## 2014-12-31 NOTE — Progress Notes (Signed)
Patient ID: Morgan Bean, female   DOB: 02-Jun-1965, 49 y.o.   MRN: [id number]   Morgan Bean, is a 49 y.o. female  OVZ:[id number]  [id number]:[id number]  DOB - 12/15/1965  CC:  Chief Complaint  Patient presents with  . Follow-up       HPI: [name] is a 49 y.o. female with a history of HTN, hyperlipidemia, syphilis, severe neutropenia, cocaine abuse (last use 6 months ago), diastolic CHF, tricuspid regurgitation, and nonobstructive CAD, left pontine stroke in 2009. She has persistent, severe neutropenia, unresponsive to prednisone and recent G-CSF. She had multiple bone marrow biopsy performed in the past and overall consensus from multiple subspecialties were this is related to cocaine abuse. Patient is currently being followed by Dr Ernst Spell of [facility], Dr Raliegh Ip. Delice Lesch of [facility] and Dr. Einar Crow, [place]. Patient states she has new diagnosis of Moderate COPD, and is requesting Chantix prescription to help with smoking cessation. She still smokes 05.ppd of cigarettes.    Allergies  Allergen Reactions  . Ciprofloxacin Itching   Past Medical History  Diagnosis Date  . Coronary artery disease     Lexiscan myoview (10/12) with significant ST depression upon Lexiscan injection but no ischemia or infarction on perfusion images. Left heart cath (10/12): 30% mid LAD, 30% ostial D1, 50% ostial D2.    [name] Hypertension   . Tricuspid regurgitation   . Iron deficiency     hx of  . Polysubstance abuse     Prior cocaine  . Tobacco abuse   . Leukocytoclastic vasculitis ([place])     Rash across lower body, occurred in 9/11, diagnosed by skin biopsy. ANA positive. Thought to be secondary to cocaine use.  . Pulmonary HTN ([place])     Echo (9/11) with EF 65%, mild LVH, mild AI, mild MR, moderate to possibly severe TR with PA systolic pressure 52 mmHg. Echo (10/12): severe LV hypertrophy, EF 60-65%, mild MR, mild AI, moderate to severe tricuspid regurgitation, PA systolic pressure 38 mmHg.   Echo 4/14: moderate LVH, EF 65%, normal wall motion, diastolic dysfunction, mild AI, mild MR  . Asthma   . Shortness of breath     a. PFTs 5/14:  FEV1/FVC 99% predicted; FVC 60% predicted; DLCO mildly reduced, mild restriction, air trapping.;  b.  seen by pulmo (Dr. [name]) 5/14: mainly upper airway symptoms - ACE d/c'd (sx's better)  . Chronic diastolic CHF (congestive heart failure) ([place])   . Constipation   . Lymphadenopathy 03/21/2013  . Leukopenia 03/21/2013  . Unspecified deficiency anemia 03/29/2013  . Anginal pain ([place])     none in past year  . Anxiety   . Mental disorder   . Depression   . Stroke ([place]) 2010ish  . Arthritis   . Pancytopenia ([place])   . Hyperlipemia   . Syphilis 04/25/2013  . Candidiasis 06/23/2013  . Anemia of other chronic disease 11/13/2013  . Cough 11/13/2013  . Lymphadenopathy 01/30/2014  . Headache 02/22/2014  . Hx of cardiovascular stress test     Lexiscan Myoview (2/16): Breast attenuation artifact, no ischemia, EF 64%; low risk   Current Outpatient Prescriptions on File Prior to Visit  Medication Sig Dispense Refill  . acetaminophen (TYLENOL) 325 MG tablet Take 2 tablets (650 mg total) by mouth every 6 (six) hours as needed for mild pain (or Fever >/= 101).    [name] albuterol (PROVENTIL HFA;VENTOLIN HFA) 108 (90 BASE) MCG/ACT inhaler Inhale 2 puffs into the lungs every 2 (two) hours as  needed for wheezing or shortness of breath (cough). 3 Inhaler 3  . amLODipine (NORVASC) 10 MG tablet Take 1 tablet (10 mg total) by mouth daily. 90 tablet 3  . atorvastatin (LIPITOR) 40 MG tablet TAKE 1 TABLET BY MOUTH IN THE EVENING 30 tablet 2  . buPROPion (WELLBUTRIN SR) 150 MG 12 hr tablet Take 150 mg by mouth 2 (two) times daily.    . citalopram (CELEXA) 40 MG tablet Take 1 tablet (40 mg total) by mouth daily. 30 tablet 1  . cloNIDine (CATAPRES) 0.2 MG tablet Take 1 tablet (0.2 mg total) by mouth 2 (two) times daily. 180 tablet 3  . clopidogrel (PLAVIX) 75 MG tablet Take 1  tablet (75 mg total) by mouth daily. 90 tablet 3  . ferrous sulfate 325 (65 FE) MG tablet Take 650 mg by mouth daily with breakfast.    . furosemide (LASIX) 40 MG tablet Take 40 mg in the AM (2 tabs) and take 20 mg in the PM (1 tab) 90 tablet 3  . hydrOXYzine (ATARAX/VISTARIL) 25 MG tablet Take 25 mg by mouth at bedtime as needed (sleep).    . loratadine (CLARITIN) 10 MG tablet Take 1 tablet (10 mg total) by mouth daily. 30 tablet 3  . Multiple Vitamin (MULTIVITAMIN WITH MINERALS) TABS Take 1 tablet by mouth daily.    [name] topiramate (TOPAMAX) 25 MG tablet Take 1 tablet at night for 1 week, then increase to 2 tablets at night 60 tablet 4  . traZODone (DESYREL) 100 MG tablet Take 1 tablet (100 mg total) by mouth at bedtime. 30 tablet 1  . omeprazole (PRILOSEC) 40 MG capsule Take 1 capsule (40 mg total) by mouth daily. 30 capsule 2  . polyethylene glycol powder (GLYCOLAX/MIRALAX) powder Take 17 g by mouth daily as needed (constipation).     . potassium chloride (K-DUR) 10 MEQ tablet Take 2 tabs (20 meQ) twice a day 120 tablet 2   No current facility-administered medications on file prior to visit.   Family History  Problem Relation Age of Onset  . Coronary artery disease Mother   . Diabetes Mother   . Breast cancer Maternal Grandmother   . Diabetes Maternal Grandmother   . Diabetes Father   . Hypertension Father   . Coronary artery disease Father   . Heart attack Maternal Grandmother   . Stroke Maternal Grandmother   . Heart attack Father    Social History   Social History  . Marital Status: Single    Spouse Name: N/A  . Number of Children: 2  . Years of Education: N/A   Occupational History  . unemployed    Social History Main Topics  . Smoking status: Current Every Day Smoker -- 0.25 packs/day for 30 years    Types: Cigarettes  . Smokeless tobacco: Never Used  . Alcohol Use: No     Comment: pint of wine 2-3 times a week--ocassional - quit 2012  . Drug Use: 1.00 per week     Special: "Crack" cocaine     Comment: none since 02/04/14  . Sexual Activity: Yes   Other Topics Concern  . Not on file   Social History Narrative   Lives with mom, sisters and brothers.    Review of Systems  Constitutional: Negative.   HENT: Negative.   Eyes: Negative.   Respiratory: Negative.   Cardiovascular: Negative.   Gastrointestinal: Negative.   Genitourinary: Negative.   Musculoskeletal: Negative.   Skin: Negative.   Neurological: Negative.  Endo/Heme/Allergies: Negative.   Psychiatric/Behavioral: Negative.       Objective:   Filed Vitals:   12/31/14 1436  BP: 117/82  Pulse: 76  Temp: 98 F (36.7 C)  Resp: 16   Physical Exam  Constitutional: She is oriented to person, place, and time. She appears well-developed and well-nourished.  HENT:  Head: Normocephalic and atraumatic.  Right Ear: External ear normal.  Left Ear: External ear normal.  Nose: Nose normal.  Mouth/Throat: Oropharynx is clear and moist.  Eyes: Conjunctivae and EOM are normal. Pupils are equal, round, and reactive to light.  Neck: Normal range of motion.  Cardiovascular: Normal rate, regular rhythm, normal heart sounds and intact distal pulses.   Pulmonary/Chest: Effort normal and breath sounds normal.  Abdominal: Soft. Bowel sounds are normal.  Genitourinary:  deferred  Musculoskeletal: Normal range of motion.  Neurological: She is alert and oriented to person, place, and time.  Skin: Skin is warm and dry.  Nursing note and vitals reviewed.      Lab Results  Component Value Date   WBC 2.7* 12/27/2014   HGB 10.7* 12/27/2014   HCT 33.6* 12/27/2014   MCV 81.0 12/27/2014   PLT 169 12/27/2014   Lab Results  Component Value Date   CREATININE 0.91 12/03/2014   BUN 14 12/03/2014   NA 139 12/03/2014   K 4.3 12/03/2014   CL 109 12/03/2014   CO2 21* 12/03/2014    Lab Results  Component Value Date   HGBA1C 5.2 10/04/2013   Lipid Panel     Component Value Date/Time    CHOL 149 01/02/2014 0826   TRIG 91.0 01/02/2014 0826   HDL 47.80 01/02/2014 0826   CHOLHDL 3 01/02/2014 0826   VLDL 18.2 01/02/2014 0826   LDLCALC 83 01/02/2014 0826       Assessment and plan:   Azia was seen today for follow-up.  Diagnoses and all orders for this visit:  Chronic obstructive pulmonary disease, unspecified COPD, unspecified chronic bronchitis type -     varenicline (CHANTIX STARTING MONTH PAK) 0.5 MG X 11 & 1 MG X 42 tablet; Take one 0.5 mg tablet by mouth once daily for 3 days, then increase to one 0.5 mg tablet twice daily for 4 days, then increase to one 1 mg tablet twice daily. Patient teaching and information given to patient regarding side effects of chantix, patient verbalized that she had used chantix in the past and was familiar with the side effects.   Tobacco use disorder Congratulated patient on cutting back on smoking and taking initiative to quit. Patient given handout on tips to stay tobacco free.    Return if symptoms worsen or fail to improve.   Clois Dupes, RN, BSN, AGNP, APP - [facility] [phone] 12/31/2014, 2:54 PM

## 2014-12-31 NOTE — Patient Instructions (Signed)

## 2015-01-01 ENCOUNTER — Ambulatory Visit: Payer: No Typology Code available for payment source | Admitting: Internal Medicine

## 2015-01-03 ENCOUNTER — Telehealth: Payer: Self-pay | Admitting: Family Medicine

## 2015-01-03 ENCOUNTER — Ambulatory Visit (HOSPITAL_COMMUNITY)
Admission: RE | Admit: 2015-01-03 | Discharge: 2015-01-03 | Disposition: A | Discharge status: Home / Self Care | Payer: Self-pay | Source: Ambulatory Visit | Attending: Neurology | Admitting: Neurology

## 2015-01-03 DIAGNOSIS — Z8673 Personal history of transient ischemic attack (TIA), and cerebral infarction without residual deficits: Secondary | ICD-10-CM | POA: Insufficient documentation

## 2015-01-03 DIAGNOSIS — G4485 Primary stabbing headache: Secondary | ICD-10-CM | POA: Insufficient documentation

## 2015-01-03 DIAGNOSIS — R4781 Slurred speech: Secondary | ICD-10-CM | POA: Insufficient documentation

## 2015-01-03 DIAGNOSIS — G319 Degenerative disease of nervous system, unspecified: Secondary | ICD-10-CM | POA: Insufficient documentation

## 2015-01-03 NOTE — Telephone Encounter (Signed)
-----   Message from Cameron Sprang, MD sent at 01/02/2015  8:42 AM EDT ----- Pls let her know bloodwork similar to prior, no significant changes. Thanks

## 2015-01-03 NOTE — Telephone Encounter (Signed)
Patient was notified of result. 

## 2015-01-04 ENCOUNTER — Telehealth: Payer: Self-pay | Admitting: Family Medicine

## 2015-01-04 NOTE — Telephone Encounter (Signed)
-----   Message from Cameron Sprang, MD sent at 01/04/2015  8:38 AM EDT ----- Pls let her know I reviewed MRI brain, no new stroke, no tumor or bleed. MRI is similar to prior scan. Thanks

## 2015-01-04 NOTE — Telephone Encounter (Signed)
Patient was notified of results.  

## 2015-01-11 ENCOUNTER — Telehealth: Payer: Self-pay | Admitting: Hematology and Oncology

## 2015-01-11 NOTE — Telephone Encounter (Signed)
FAXED RECORDS TO [facility]

## 2015-01-21 ENCOUNTER — Telehealth: Payer: Self-pay | Admitting: *Deleted

## 2015-01-21 ENCOUNTER — Other Ambulatory Visit: Payer: Self-pay | Admitting: Hematology and Oncology

## 2015-01-21 NOTE — Telephone Encounter (Signed)
Pt has appt scheduled for 12/5 with Dr Griffin Basil. Pt notified previously- [facility] was trying to get in sooner if possible. Left patient a message with this appt time. She should have received a new patient packet from [place] [facility].

## 2015-01-21 NOTE — Telephone Encounter (Signed)
Pt has not heard from [facility]. RN to follow up

## 2015-02-18 ENCOUNTER — Other Ambulatory Visit: Payer: Self-pay | Admitting: Cardiology

## 2015-02-25 DIAGNOSIS — D649 Anemia, unspecified: Secondary | ICD-10-CM | POA: Insufficient documentation

## 2015-03-13 ENCOUNTER — Ambulatory Visit (INDEPENDENT_AMBULATORY_CARE_PROVIDER_SITE_OTHER): Payer: Self-pay | Admitting: Neurology

## 2015-03-13 ENCOUNTER — Encounter: Payer: Self-pay | Admitting: Neurology

## 2015-03-13 VITALS — BP 114/72 | HR 73 | Ht 66.0 in | Wt 218.0 lb

## 2015-03-13 DIAGNOSIS — G4485 Primary stabbing headache: Secondary | ICD-10-CM

## 2015-03-13 DIAGNOSIS — I672 Cerebral atherosclerosis: Secondary | ICD-10-CM

## 2015-03-13 MED ORDER — TOPIRAMATE 25 MG PO TABS: ORAL_TABLET | ORAL | Status: DC

## 2015-03-13 NOTE — Progress Notes (Signed)
NEUROLOGY FOLLOW UP OFFICE NOTE  Morgan Bean [id number]  HISTORY OF PRESENT ILLNESS: I had the pleasure of seeing Morgan Bean in follow-up in the neurology clinic on 03/13/2015.  The patient was last seen 2 months ago for increased headaches and an episode of expressive aphasia with slurred speech last May 2015. MRI brain did not show any acute stroke. She does have intracranial atherosclerosis. She was switched to Plavix and had been doing well with no further symptoms until September 2016 when she started having frequent stabbing pain over the left temporal region. These would last 5 seconds or so but occurring multiple times every other day. She has also been having slurred speech especially upon awakening, then improving over the course of the day. She had a repeat MRI brain without contrast which did not show any acute changes. ESR was 52, however in the past ESR has been elevated 47. She was started on Topamax, currently on 32m qhs and reports and improvement in the headaches. Since her last visit, she has had only 2 mild headaches, no associated nausea/vomiting/vision changes. She feels her speech is doing pretty well. She denies any dizziness, focal numbness/tingling/weakness, no falls. She is tolerating Topamax with no side effects.   HPI: This is a pleasant 49yo RH woman with a history of hypertension, hyperlipidemia, left pontine stroke in 2009, leukopenia and anemia, history of cocaine abuse (last use November 2014), syphilis, who presented with headaches and a transient episode of expressive aphasia. She went to the ER on 08/06/13 for 10/10 headache and speech difficulties where she reported word-finding difficulties (per daughter "not making sense") because she couldn't say the right word and it took a long time to get a word out. Her speech was also slurred. She denied any difficulties with comprehension. CBC, CMP, and head CT done were unremarkable. She was discharged home to  follow-up with Neurology. She reports the speech difficulties lasted for 24 hours. She continued to have daily headaches occuring twice a day, lasting 5 minutes or so, 3/10 in intensity, with throbbing over either temporal region, or a cap-like pressure over the top of her head. She does not take any medication, stating the headaches are not bad, but are concerning because she has never had daily headaches in the past. There is no associated nausea, vomiting, photo/phonophobia, no focal numbness/tingling/weakness.   In 2009, she suddenly fell and noted right hand weakness and slurred speech. I personally reviewed MRI brain from 2009 which showed restricted diffusion in the left pons and inferior mid brain without mass effect or hemorrhage. There was moderate white matter disease advanced for age. It was noted that the main differential diagnosis in a patient of this age is multiple sclerosis. Her MRA showed widespread intracranial atherosclerosis, basilar tip is ectatic measuring roughly 3 x 6.5 mm, without saccular aneurysm formation, mild to moderate stenoses of the left MCA origin, right MCA trifurcation and left ICA pre cavernous segment are noted. She was discharged on aspirin. She had left arm pain last year and tells me she was started on Lipitor at that time. She stopped smoking in March 2015 after smoking 1/2 PPD since age 49 There is no family history of headaches or aneurysm.  Diagnostic Data: I personally reviewed MRI brain with and without contrast done 09/2013 which showed generalized cerebral atrophy, advanced for patient age, extensive T2/FLAIR hyperintensity within the periventricular and deep white matter both cerebral hemispheres. There is a remote lacunar infarct in the white matter of  the corona radiata of both cerebral hemispheres, remote lacunar infarct present within the left pons. Her MRA head was abnormal with multifocal atherosclerotic stenosis in the cavernous right ICA, measuring  up to 50-60% by NASCET criteria, short segment stenosis of approximately 30-40% within the pre cavernous left ICA, smooth atherosclerotic stenosis of approximately 30-40% within the cavernous left ICA, additional widespread multi focal intracranial atherosclerosis, predominantly involving the in M1 segments and basilar artery. Her MRI C-spine did not show any myelopathy, there was spondylosis with mild to moderate foraminal narrowing at C5-6 bilaterally. There was note of persistent upper cervical adenopathy and mucosal edema with heterogeneous enhancement, appearing slightly improved and presumably inflammatory.   PAST MEDICAL HISTORY: Past Medical History  Diagnosis Date  . Coronary artery disease     Lexiscan myoview (10/12) with significant ST depression upon Lexiscan injection but no ischemia or infarction on perfusion images. Left heart cath (10/12): 30% mid LAD, 30% ostial D1, 50% ostial D2.    [name] Hypertension   . Tricuspid regurgitation   . Iron deficiency     hx of  . Polysubstance abuse     Prior cocaine  . Tobacco abuse   . Leukocytoclastic vasculitis ([place])     Rash across lower body, occurred in 9/11, diagnosed by skin biopsy. ANA positive. Thought to be secondary to cocaine use.  . Pulmonary HTN ([place])     Echo (9/11) with EF 65%, mild LVH, mild AI, mild MR, moderate to possibly severe TR with PA systolic pressure 52 mmHg. Echo (10/12): severe LV hypertrophy, EF 60-65%, mild MR, mild AI, moderate to severe tricuspid regurgitation, PA systolic pressure 38 mmHg.  Echo 4/14: moderate LVH, EF 65%, normal wall motion, diastolic dysfunction, mild AI, mild MR  . Asthma   . Shortness of breath     a. PFTs 5/14:  FEV1/FVC 99% predicted; FVC 60% predicted; DLCO mildly reduced, mild restriction, air trapping.;  b.  seen by pulmo (Dr. [name]) 5/14: mainly upper airway symptoms - ACE d/c'd (sx's better)  . Chronic diastolic CHF (congestive heart failure) ([place])   . Constipation   .  Lymphadenopathy 03/21/2013  . Leukopenia 03/21/2013  . Unspecified deficiency anemia 03/29/2013  . Anginal pain ([place])     none in past year  . Anxiety   . Mental disorder   . Depression   . Stroke ([place]) 2010ish  . Arthritis   . Pancytopenia ([place])   . Hyperlipemia   . Syphilis 04/25/2013  . Candidiasis 06/23/2013  . Anemia of other chronic disease 11/13/2013  . Cough 11/13/2013  . Lymphadenopathy 01/30/2014  . Headache 02/22/2014  . Hx of cardiovascular stress test     Lexiscan Myoview (2/16): Breast attenuation artifact, no ischemia, EF 64%; low risk    MEDICATIONS: Current Outpatient Prescriptions on File Prior to Visit  Medication Sig Dispense Refill  . acetaminophen (TYLENOL) 325 MG tablet Take 2 tablets (650 mg total) by mouth every 6 (six) hours as needed for mild pain (or Fever >/= 101).    [name] albuterol (PROVENTIL HFA;VENTOLIN HFA) 108 (90 BASE) MCG/ACT inhaler Inhale 2 puffs into the lungs every 2 (two) hours as needed for wheezing or shortness of breath (cough). 3 Inhaler 3  . amLODipine (NORVASC) 10 MG tablet Take 1 tablet (10 mg total) by mouth daily. 90 tablet 3  . atorvastatin (LIPITOR) 40 MG tablet TAKE 1 TABLET BY MOUTH IN THE EVENING 30 tablet 2  . buPROPion (WELLBUTRIN SR) 150 MG 12 hr tablet Take 150  mg by mouth 2 (two) times daily.    . citalopram (CELEXA) 40 MG tablet Take 1 tablet (40 mg total) by mouth daily. 30 tablet 1  . cloNIDine (CATAPRES) 0.2 MG tablet Take 1 tablet (0.2 mg total) by mouth 2 (two) times daily. 180 tablet 3  . clopidogrel (PLAVIX) 75 MG tablet Take 1 tablet (75 mg total) by mouth daily. 90 tablet 3  . ferrous sulfate 325 (65 FE) MG tablet Take 650 mg by mouth daily with breakfast.    . hydrOXYzine (ATARAX/VISTARIL) 25 MG tablet Take 25 mg by mouth at bedtime as needed (sleep).    . loratadine (CLARITIN) 10 MG tablet Take 1 tablet (10 mg total) by mouth daily. (Patient taking differently: Take by mouth daily. ) 30 tablet 3  . Multiple Vitamin  (MULTIVITAMIN WITH MINERALS) TABS Take 1 tablet by mouth daily.    [name] omeprazole (PRILOSEC) 20 MG capsule TAKE 2 CAPSULES BY MOUTH DAILY 60 capsule 2  . polyethylene glycol powder (GLYCOLAX/MIRALAX) powder Take 17 g by mouth daily as needed (constipation).     . potassium chloride (K-DUR) 10 MEQ tablet Take 2 tabs (20 meQ) twice a day 120 tablet 2  . topiramate (TOPAMAX) 25 MG tablet Take 1 tablet at night for 1 week, then increase to 2 tablets at night (Patient taking differently: Take 2 tablets at night) 60 tablet 4  . traZODone (DESYREL) 100 MG tablet Take 1 tablet (100 mg total) by mouth at bedtime. 30 tablet 1   No current facility-administered medications on file prior to visit.    ALLERGIES: Allergies  Allergen Reactions  . Ciprofloxacin Itching    FAMILY HISTORY: Family History  Problem Relation Age of Onset  . Coronary artery disease Mother   . Diabetes Mother   . Breast cancer Maternal Grandmother   . Diabetes Maternal Grandmother   . Diabetes Father   . Hypertension Father   . Coronary artery disease Father   . Heart attack Maternal Grandmother   . Stroke Maternal Grandmother   . Heart attack Father     SOCIAL HISTORY: Social History   Social History  . Marital Status: Single    Spouse Name: N/A  . Number of Children: 2  . Years of Education: N/A   Occupational History  . unemployed    Social History Main Topics  . Smoking status: Current Every Day Smoker -- 0.25 packs/day for 30 years    Types: Cigarettes  . Smokeless tobacco: Never Used  . Alcohol Use: No     Comment: pint of wine 2-3 times a week--ocassional - quit 2012  . Drug Use: 1.00 per week    Special: "Crack" cocaine     Comment: none since 02/04/14  . Sexual Activity: Yes   Other Topics Concern  . Not on file   Social History Narrative   Lives with mom, sisters and brothers.    REVIEW OF SYSTEMS: Constitutional: No fevers, chills, or sweats, no generalized fatigue, change in  appetite Eyes: No visual changes, double vision, eye pain Ear, nose and throat: No hearing loss, ear pain, nasal congestion, sore throat Cardiovascular: No chest pain, palpitations Respiratory:  No shortness of breath at rest or with exertion, wheezes GastrointestinaI: No nausea, vomiting, diarrhea, abdominal pain, fecal incontinence Genitourinary:  No dysuria, urinary retention or frequency Musculoskeletal:  No neck pain, back pain Integumentary: No rash, pruritus, skin lesions Neurological: as above Psychiatric: No depression, insomnia, anxiety Endocrine: No palpitations, fatigue, diaphoresis, mood swings, change  in appetite, change in weight, increased thirst Hematologic/Lymphatic:  No anemia, purpura, petechiae. Allergic/Immunologic: no itchy/runny eyes, nasal congestion, recent allergic reactions, rashes  PHYSICAL EXAM: Filed Vitals:   03/13/15 1453  BP: 114/72  Pulse: 73   General: No acute distress Head:  Normocephalic/atraumatic Neck: supple, no paraspinal tenderness, full range of motion Heart:  Regular rate and rhythm Lungs:  Clear to auscultation bilaterally Back: No paraspinal tenderness Skin/Extremities: No rash, no edema Neurological Exam: alert and oriented to person, place, and time, + mild dysarthria (some improvement, more fluent), no aphasia, Fund of knowledge is appropriate. Recent and remote memory are intact. Attention and concentration are normal. Able to name objects and repeat phrases.  Cranial nerves:  CN I: not tested  CN II: pupils equal, round and reactive to light, visual fields intact, fundi unremarkable.  CN III, IV, VI: full range of motion, no nystagmus, no ptosis  CN V: facial sensation intact  CN VII: upper and lower face symmetric  CN VIII: hearing intact to finger rub  CN IX, X: gag intact, uvula midline  CN XI: sternocleidomastoid and trapezius muscles intact  CN XII: tongue midline  Bulk & Tone: normal, no fasciculations.   Motor: 5/5 throughout with no pronator drift.  Sensation: intact to light touch. No extinction to double simultaneous stimulation. Romberg test negative Deep Tendon Reflexes: brisk +3 throughout with bilateral Hoffman's sign, hyperactive pectoralis reflex, and 5-beats unsustained clonus on right ankle, 3-beats on left ankle (similar to prior) Plantar responses: downgoing bilaterally  Cerebellar: no incoordination on finger to nose Gait: narrow-based and steady, slight difficulty with tandem walk but able (similar to prior) Tremor: none   IMPRESSION: This is a pleasant 49 yo RH woman with a history of hypertension, hyperlipidemia, syphilis, cocaine abuse (last use 01/2013), left pontine stroke in 2009 with intracranial atherosclerosis and white matter changes advanced for age again noted on repeat imaging July 2015 after she had new onset daily headaches and an episode suggestive of expressive aphasia with slurred speech last 08/06/13, concerning for TIA. No acute changes on MRI.She continues on Plavix for secondary stroke prevention and continues on statin therapy. She reported slurred speech and new onset stabbing headaches on her last visit, repeat MRI did not show any acute changes. She was started on Topamax 29m qhs with good response. She reports speech is better as well. Continue current medications. She knows to go to the ER if symptoms recur or progress. She will follow-up in 4 months and knows to call our office for any changes.  Thank you for allowing me to participate in her care.  Please do not hesitate to call for any questions or concerns.  The duration of this appointment visit was 25 minutes of face-to-face time with the patient.  Greater than 50% of this time was spent in counseling, explanation of diagnosis, planning of further management, and coordination of care.   KEllouise Newer M.D.   CC: Dr. JDoreene Burke

## 2015-03-13 NOTE — Patient Instructions (Signed)
1. Continue Topamax 25mg : Take 2 tablets at bedtime 2. Continue Plavix daily 3. Follow-up in 4 months, call for any changes

## 2015-03-18 ENCOUNTER — Encounter: Payer: Self-pay | Admitting: Neurology

## 2015-04-18 ENCOUNTER — Ambulatory Visit (HOSPITAL_BASED_OUTPATIENT_CLINIC_OR_DEPARTMENT_OTHER)
Admission: RE | Admit: 2015-04-18 | Discharge: 2015-04-18 | Disposition: A | Discharge status: Home / Self Care | Payer: Self-pay | Source: Ambulatory Visit | Attending: Cardiology | Admitting: Cardiology

## 2015-04-18 ENCOUNTER — Encounter (HOSPITAL_COMMUNITY): Payer: Self-pay | Admitting: *Deleted

## 2015-04-18 ENCOUNTER — Ambulatory Visit (HOSPITAL_COMMUNITY)
Admission: RE | Admit: 2015-04-18 | Discharge: 2015-04-18 | Disposition: A | Discharge status: Home / Self Care | Payer: Self-pay | Source: Ambulatory Visit | Attending: Internal Medicine | Admitting: Internal Medicine

## 2015-04-18 ENCOUNTER — Encounter (HOSPITAL_COMMUNITY): Payer: Self-pay

## 2015-04-18 ENCOUNTER — Other Ambulatory Visit (HOSPITAL_COMMUNITY): Payer: Self-pay | Admitting: *Deleted

## 2015-04-18 VITALS — BP 124/80 | HR 76 | Wt 220.5 lb

## 2015-04-18 DIAGNOSIS — I5022 Chronic systolic (congestive) heart failure: Secondary | ICD-10-CM

## 2015-04-18 DIAGNOSIS — I5032 Chronic diastolic (congestive) heart failure: Secondary | ICD-10-CM | POA: Insufficient documentation

## 2015-04-18 DIAGNOSIS — I10 Essential (primary) hypertension: Secondary | ICD-10-CM | POA: Insufficient documentation

## 2015-04-18 DIAGNOSIS — F17200 Nicotine dependence, unspecified, uncomplicated: Secondary | ICD-10-CM | POA: Insufficient documentation

## 2015-04-18 DIAGNOSIS — E785 Hyperlipidemia, unspecified: Secondary | ICD-10-CM | POA: Insufficient documentation

## 2015-04-18 DIAGNOSIS — I272 Other secondary pulmonary hypertension: Secondary | ICD-10-CM

## 2015-04-18 DIAGNOSIS — I340 Nonrheumatic mitral (valve) insufficiency: Secondary | ICD-10-CM | POA: Insufficient documentation

## 2015-04-18 DIAGNOSIS — Z720 Tobacco use: Secondary | ICD-10-CM

## 2015-04-18 DIAGNOSIS — I071 Rheumatic tricuspid insufficiency: Secondary | ICD-10-CM | POA: Insufficient documentation

## 2015-04-18 DIAGNOSIS — I517 Cardiomegaly: Secondary | ICD-10-CM | POA: Insufficient documentation

## 2015-04-18 DIAGNOSIS — I351 Nonrheumatic aortic (valve) insufficiency: Secondary | ICD-10-CM | POA: Insufficient documentation

## 2015-04-18 LAB — BASIC METABOLIC PANEL
ANION GAP: 7 (ref 5–15)
BUN: 14 mg/dL (ref 6–20)
CALCIUM: 9.3 mg/dL (ref 8.9–10.3)
CO2: 21 mmol/L — AB (ref 22–32)
Chloride: 109 mmol/L (ref 101–111)
Creatinine, Ser: 1.05 mg/dL — ABNORMAL HIGH (ref 0.44–1.00)
GFR calc Af Amer: >60 mL/min (ref >60)
GFR calc non Af Amer: >60 mL/min (ref >60)
GLUCOSE: 99 mg/dL (ref 65–99)
Potassium: 4.2 mmol/L (ref 3.5–5.1)
Sodium: 137 mmol/L (ref 135–145)

## 2015-04-18 LAB — CBC
HCT: 37 % (ref 36.0–46.0)
HEMOGLOBIN: 11.8 g/dL — AB (ref 12.0–15.0)
MCH: 26.1 pg (ref 26.0–34.0)
MCHC: 31.9 g/dL (ref 30.0–36.0)
MCV: 81.9 fL (ref 78.0–100.0)
Platelets: 152 10*3/uL (ref 150–400)
RBC: 4.52 MIL/uL (ref 3.87–5.11)
RDW: 14.1 % (ref 11.5–15.5)
WBC: 4.7 10*3/uL (ref 4.0–10.5)

## 2015-04-18 LAB — BRAIN NATRIURETIC PEPTIDE: B Natriuretic Peptide: 81.9 pg/mL (ref 0.0–100.0)

## 2015-04-18 LAB — PROTIME-INR
INR: 1.09 (ref 0.00–1.49)
Prothrombin Time: 14.3 seconds (ref 11.6–15.2)

## 2015-04-18 MED ORDER — FUROSEMIDE 20 MG PO TABS: 40.0000 mg | ORAL_TABLET | Freq: Two times a day (BID) | ORAL | Status: DC

## 2015-04-18 NOTE — Progress Notes (Signed)
Patient ID: Morgan Bean, female   DOB: 12-10-1965, 50 y.o.   MRN: [id number] PCP: Dr. Doreene Burke Cardiology: Dr. Aundra Dubin  50 yo with history of diastolic CHF, tricuspid regurgitation, and nonobstructive CAD presents for followup.  She developed pancytopenia probably from a cocaine contaminant.  She has been hospitalized with infections in the setting of neutropenia.  She has been treated for syphilis.  She is no longer using cocaine.  She has atypical chest pain, and Cardiolite in 2/16 was low risk with no ischemia.  Last echo in 1/17 showed EF 60-65%, moderate TR, PA systolic pressure 40 mmHg. PFTs in 10/16 showed moderate COPD.   She made today's appointment because she is feeling more short of breath than in the past.  Weight is up 8 lbs.  She is cutting back on cigarettes, down to 2 cigs/day.  She is very short of breath walking up steps.  This has worsened over the last few weeks.  She is short of breath walking about 1/2 block on flat ground.  No chest pain.  She has symptoms consistent with PND and sleeps on 3 pillows. No wheezing.   ECG (5/16) with NSR, nonspecific T wave flattening  Labs (9/12): HCT 31.5, BNP 132, K 4, creatinine 1.2 Labs (10/12): LDL 142, HDL 46, K 4.2, creatinine 1.3 Labs (4/14): K 4.3, creatinine 1.1, BNP 190, LDL 97, HDL 45 Labs (3/15): LDL 106, HDL 38 Labs (4/15): K 4, creatinine 0.8, WBCs 3.1, HCT 30.6, plts 157 Labs (3/16): K 3.6, creatinine 0.75, HIV negative, WBCs 3, HCT 29.8, plts 167 Labs (10/16): WBCs 2.7, HCT 33.6, plts 169  PMH: 1. Fe-deficiency anemia 2. HTN 3. Asthma 4. Active smoker 5. Prior cocaine 6. H/o CVA 2009. ?TIA in 7/15.  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  ANA positive.  Thought to be secondary to cocaine use.   8. Pulmonary hypertension/tricuspid regurgitation: Echo (9/11) with EF 65%, mild LVH, mild AI, mild MR, moderate to possibly severe TR with PA systolic pressure 52 mmHg.   Echo (10/12): severe LV hypertrophy, EF 60-65%, mild MR, mild AI, moderate to severe tricuspid regurgitation, PA systolic pressure 38 mmHg.  RHC (10/12) with mean RA 4, PA 28/9, mean PCWP 9, CI 3.8.  Echo (4/14) with EF 65%, moderate LVH, mild AI, mild MR, mild TR.  Echo (6/15) with EF 60-65%, grade II diastolic dysfunction, mild MR, normal RV size and systolic function, moderate TR, mild AI.  Echo (1/17) with EF 60-65%, mild LVH, mild MR, mild AI, moderate TR with PASP 40 mmHg.  9. Chest pain: Lexiscan myoview (10/12) with significant ST depression upon Lexiscan injection but no ischemia or infarction on perfusion images.  Left heart cath (10/12): 30% mid LAD, 30% ostial D1, 50% ostial D2.  Cardiolite (2/16) with EF 64%, small mild basal-mid inferolateral fixed defect, low risk/probably attenuation.   10. Diastolic CHF: Echo (5/80) with EF 60-65%, moderate LVH, mild AI, mild MR.  11. COPD: PFTs in 6/14 with no obstructive by spirometry but mild air-trapping.  Mild restriction as well. PFTs (10/16) with moderate COPD (worsened).  12. Pancytopenia: Bone marrow biopsy negative for lymphoma/leukemia.  Thought to be related to an agent in cocaine.  56. H/o syphilis  SH: Quit smoking in 3/15 but later restarted => now down to 2 cigs/day, has 1 daughter, unemployed.  Prior cocaine abuse.    FH: Grandmother with "heart problems."  Father with 2 open heart surgeries in his late  57s.  Brother with PCI at age 15.   ROS: All systems reviewed and negative except as per HPI.   Current Outpatient Prescriptions  Medication Sig Dispense Refill  . albuterol (PROVENTIL HFA;VENTOLIN HFA) 108 (90 BASE) MCG/ACT inhaler Inhale 2 puffs into the lungs every 2 (two) hours as needed for wheezing or shortness of breath (cough). 3 Inhaler 3  . amLODipine (NORVASC) 10 MG tablet Take 1 tablet (10 mg total) by mouth daily. 90 tablet 3  . atorvastatin (LIPITOR) 40 MG tablet TAKE 1 TABLET BY MOUTH IN THE EVENING 30 tablet 2  .  buPROPion (WELLBUTRIN SR) 150 MG 12 hr tablet Take 150 mg by mouth 2 (two) times daily.    . citalopram (CELEXA) 40 MG tablet Take 1 tablet (40 mg total) by mouth daily. 30 tablet 1  . cloNIDine (CATAPRES) 0.2 MG tablet Take 1 tablet (0.2 mg total) by mouth 2 (two) times daily. 180 tablet 3  . clopidogrel (PLAVIX) 75 MG tablet Take 1 tablet (75 mg total) by mouth daily. 90 tablet 3  . ferrous sulfate 325 (65 FE) MG tablet Take 650 mg by mouth daily with breakfast.    . furosemide (LASIX) 20 MG tablet Take 2 tablets (40 mg total) by mouth 2 (two) times daily. 30 tablet 3  . hydrOXYzine (ATARAX/VISTARIL) 25 MG tablet Take 25 mg by mouth at bedtime as needed (sleep).    . loratadine (CLARITIN) 10 MG tablet Take 1 tablet (10 mg total) by mouth daily. 30 tablet 3  . Multiple Vitamin (MULTIVITAMIN WITH MINERALS) TABS Take 1 tablet by mouth daily.    [name] omeprazole (PRILOSEC) 20 MG capsule TAKE 2 CAPSULES BY MOUTH DAILY 60 capsule 2  . polyethylene glycol powder (GLYCOLAX/MIRALAX) powder Take 17 g by mouth daily as needed (constipation).     . potassium chloride (K-DUR) 10 MEQ tablet Take 2 tabs (20 meQ) twice a day 120 tablet 2  . topiramate (TOPAMAX) 25 MG tablet Take 2 tablets at night 60 tablet 6  . traZODone (DESYREL) 100 MG tablet Take 1 tablet (100 mg total) by mouth at bedtime. 30 tablet 1   No current facility-administered medications for this encounter.    BP 124/80 mmHg  Pulse 76  Wt 220 lb 8 oz (100.018 kg)  SpO2 100%  LMP 04/24/2013 General: NAD Neck: JVP 8 cm, no thyromegaly or thyroid nodule.  Lungs: Slightly distant breath sounds.   CV: Nondisplaced PMI.  Heart regular S1/S2, no S3/S4, no murmur.  Trace ankle edema.  No carotid bruit.  Normal pedal pulses.  Abdomen: Soft, nontender, no hepatosplenomegaly, no distention.  Neurologic: Alert and oriented x 3.  Psych: Normal affect. Extremities: No clubbing or cyanosis.   Assessment/Plan:  1. Chronic diastolic CHF: NYHA class III  symptoms, worsened recently with possible PND and orthopnea.  On exam, she does not appear particularly volume overloaded, hard to tell if symptoms are due to COPD or CHF.  She has a history of diastolic CHF and TR, TR was moderate on 1/17 echo.  She did not have pulmonary hypertension on prior RHC in 2012.  However, PA pressure is mildly elevated by noninvasive evaluation on 1/17 echo.  - Empiric increase in Lasix to 40 mg bid with BMET/BNP today and repeat in a week. - I will arrange for RHC to assess filling pressures and for pulmonary hypertension.  This will help to differentiate dyspnea related to lung disease versus CHF/pulmonary hypertension.   We discussed risks/benefits of the procedure  and she agrees to proceed. 2. Hyperlipidemia: She is on a statin with nonobstructive coronary disease.   3. HTN: BP controlled.   4. Tricuspid regurgitation: Moderate on last echo.  Has not been documented to have significant pulmonary edema. As above, will repeat RHC to assess.  5. Smoking: She needs to quit.  She has cut down to 2 cigs/day, I strongly encouraged her to stop completely.  She has moderate COPD by PFTs.    Loralie Champagne 04/18/2015

## 2015-04-18 NOTE — Progress Notes (Signed)
  Echocardiogram 2D Echocardiogram has been performed.  Donata Clay 04/18/2015, 9:16 AM

## 2015-04-18 NOTE — Patient Instructions (Signed)
Increase Furosemide to 40 mg Twice daily   Labs today  Right Heart Catheterization on 04/23/15, see instruction sheet  Your physician recommends that you schedule a follow-up appointment in: 3 weeks

## 2015-04-19 ENCOUNTER — Telehealth (HOSPITAL_COMMUNITY): Payer: Self-pay | Admitting: *Deleted

## 2015-04-19 NOTE — Telephone Encounter (Signed)
No pre cert reqd for rhc [id number]

## 2015-04-23 ENCOUNTER — Ambulatory Visit (HOSPITAL_COMMUNITY)
Admission: RE | Admit: 2015-04-23 | Discharge: 2015-04-23 | Disposition: A | Discharge status: Home / Self Care | Payer: No Typology Code available for payment source | Source: Ambulatory Visit | Attending: Cardiology | Admitting: Cardiology

## 2015-04-23 ENCOUNTER — Encounter (HOSPITAL_COMMUNITY): Payer: Self-pay | Admitting: Cardiology

## 2015-04-23 ENCOUNTER — Encounter (HOSPITAL_COMMUNITY)
Admission: RE | Disposition: A | Discharge status: Home / Self Care | Payer: Self-pay | Source: Ambulatory Visit | Attending: Cardiology

## 2015-04-23 DIAGNOSIS — I272 Other secondary pulmonary hypertension: Secondary | ICD-10-CM | POA: Insufficient documentation

## 2015-04-23 DIAGNOSIS — Z7902 Long term (current) use of antithrombotics/antiplatelets: Secondary | ICD-10-CM | POA: Insufficient documentation

## 2015-04-23 DIAGNOSIS — D61818 Other pancytopenia: Secondary | ICD-10-CM | POA: Insufficient documentation

## 2015-04-23 DIAGNOSIS — D509 Iron deficiency anemia, unspecified: Secondary | ICD-10-CM | POA: Insufficient documentation

## 2015-04-23 DIAGNOSIS — I2510 Atherosclerotic heart disease of native coronary artery without angina pectoris: Secondary | ICD-10-CM | POA: Insufficient documentation

## 2015-04-23 DIAGNOSIS — F17210 Nicotine dependence, cigarettes, uncomplicated: Secondary | ICD-10-CM | POA: Insufficient documentation

## 2015-04-23 DIAGNOSIS — J45909 Unspecified asthma, uncomplicated: Secondary | ICD-10-CM | POA: Insufficient documentation

## 2015-04-23 DIAGNOSIS — I110 Hypertensive heart disease with heart failure: Secondary | ICD-10-CM | POA: Insufficient documentation

## 2015-04-23 DIAGNOSIS — I5032 Chronic diastolic (congestive) heart failure: Secondary | ICD-10-CM | POA: Insufficient documentation

## 2015-04-23 DIAGNOSIS — I071 Rheumatic tricuspid insufficiency: Secondary | ICD-10-CM | POA: Insufficient documentation

## 2015-04-23 DIAGNOSIS — R0600 Dyspnea, unspecified: Secondary | ICD-10-CM

## 2015-04-23 DIAGNOSIS — J449 Chronic obstructive pulmonary disease, unspecified: Secondary | ICD-10-CM | POA: Insufficient documentation

## 2015-04-23 DIAGNOSIS — Z8673 Personal history of transient ischemic attack (TIA), and cerebral infarction without residual deficits: Secondary | ICD-10-CM | POA: Insufficient documentation

## 2015-04-23 DIAGNOSIS — E785 Hyperlipidemia, unspecified: Secondary | ICD-10-CM | POA: Insufficient documentation

## 2015-04-23 DIAGNOSIS — I5022 Chronic systolic (congestive) heart failure: Secondary | ICD-10-CM

## 2015-04-23 HISTORY — PX: CARDIAC CATHETERIZATION: SHX172

## 2015-04-23 LAB — POCT I-STAT 3, VENOUS BLOOD GAS (G3P V)
Acid-base deficit: 5 mmol/L — ABNORMAL HIGH (ref 0.0–2.0)
Acid-base deficit: 6 mmol/L — ABNORMAL HIGH (ref 0.0–2.0)
BICARBONATE: 20.1 meq/L (ref 20.0–24.0)
BICARBONATE: 20.9 meq/L (ref 20.0–24.0)
O2 SAT: 57 %
O2 SAT: 57 %
PCO2 VEN: 40.1 mmHg — AB (ref 45.0–50.0)
PCO2 VEN: 41.8 mmHg — AB (ref 45.0–50.0)
PH VEN: 7.308 — AB (ref 7.250–7.300)
PO2 VEN: 32 mmHg (ref 30.0–45.0)
PO2 VEN: 32 mmHg (ref 30.0–45.0)
TCO2: 21 mmol/L (ref 0–100)
TCO2: 22 mmol/L (ref 0–100)
pH, Ven: 7.309 — ABNORMAL HIGH (ref 7.250–7.300)

## 2015-04-23 SURGERY — RIGHT HEART CATH
Anesthesia: LOCAL

## 2015-04-23 MED ORDER — SODIUM CHLORIDE 0.9% FLUSH: 3.0000 mL | INTRAVENOUS | Status: DC | PRN

## 2015-04-23 MED ORDER — FENTANYL CITRATE (PF) 100 MCG/2ML IJ SOLN
INTRAMUSCULAR | Status: DC | PRN
  Administered 2015-04-23: 25 ug via INTRAVENOUS

## 2015-04-23 MED ORDER — HEPARIN (PORCINE) IN NACL 2-0.9 UNIT/ML-% IJ SOLN
INTRAMUSCULAR | Status: DC | PRN
  Administered 2015-04-23: 500 mL

## 2015-04-23 MED ORDER — LIDOCAINE HCL (PF) 1 % IJ SOLN
INTRAMUSCULAR | Status: DC | PRN
  Administered 2015-04-23: 2 mL via INTRADERMAL

## 2015-04-23 MED ORDER — ACETAMINOPHEN 325 MG PO TABS: 650.0000 mg | ORAL_TABLET | ORAL | Status: DC | PRN

## 2015-04-23 MED ORDER — SODIUM CHLORIDE 0.9 % IV SOLN: 250.0000 mL | INTRAVENOUS | Status: DC | PRN

## 2015-04-23 MED ORDER — MIDAZOLAM HCL 2 MG/2ML IJ SOLN
INTRAMUSCULAR | Status: AC
  Filled 2015-04-23: qty 2

## 2015-04-23 MED ORDER — SODIUM CHLORIDE 0.9 % IV SOLN
INTRAVENOUS | Status: DC
  Administered 2015-04-23: 09:00:00 via INTRAVENOUS

## 2015-04-23 MED ORDER — ASPIRIN 81 MG PO CHEW
81.0000 mg | CHEWABLE_TABLET | ORAL | Status: AC
  Administered 2015-04-23: 81 mg via ORAL

## 2015-04-23 MED ORDER — SODIUM CHLORIDE 0.9% FLUSH: 3.0000 mL | Freq: Two times a day (BID) | INTRAVENOUS | Status: DC

## 2015-04-23 MED ORDER — SODIUM CHLORIDE 0.9 % IV SOLN
250.0000 mL | INTRAVENOUS | Status: DC | PRN
Start: 2015-04-23 — End: 2015-04-23

## 2015-04-23 MED ORDER — ASPIRIN 81 MG PO CHEW
CHEWABLE_TABLET | ORAL | Status: AC
  Administered 2015-04-23: 81 mg via ORAL
  Filled 2015-04-23: qty 1

## 2015-04-23 MED ORDER — ONDANSETRON HCL 4 MG/2ML IJ SOLN: 4.0000 mg | Freq: Four times a day (QID) | INTRAMUSCULAR | Status: DC | PRN

## 2015-04-23 MED ORDER — MIDAZOLAM HCL 2 MG/2ML IJ SOLN
INTRAMUSCULAR | Status: DC | PRN
  Administered 2015-04-23: 1 mg via INTRAVENOUS

## 2015-04-23 MED ORDER — LIDOCAINE HCL (PF) 1 % IJ SOLN
INTRAMUSCULAR | Status: AC
  Filled 2015-04-23: qty 30

## 2015-04-23 MED ORDER — FENTANYL CITRATE (PF) 100 MCG/2ML IJ SOLN
INTRAMUSCULAR | Status: AC
  Filled 2015-04-23: qty 2

## 2015-04-23 SURGICAL SUPPLY — 6 items
CATH BALLN WEDGE 5F 110CM (CATHETERS) ×2 IMPLANT
PACK CARDIAC CATHETERIZATION (CUSTOM PROCEDURE TRAY) ×2 IMPLANT
SHEATH FAST CATH BRACH 5F 5CM (SHEATH) ×2 IMPLANT
TRANSDUCER W/STOPCOCK (MISCELLANEOUS) ×3 IMPLANT
TUBING ART PRESS 72  MALE/FEM (TUBING) ×1
TUBING ART PRESS 72 MALE/FEM (TUBING) IMPLANT

## 2015-04-23 NOTE — Interval H&P Note (Signed)
History and Physical Interval Note:  04/23/2015 10:24 AM  Morgan Bean  has presented today for surgery, with the diagnosis of chf, pulmonary hypertension  The various methods of treatment have been discussed with the patient and family. After consideration of risks, benefits and other options for treatment, the patient has consented to  Procedure(s): Right Heart Cath (N/A) as a surgical intervention .  The patient's history has been reviewed, patient examined, no change in status, stable for surgery.  I have reviewed the patient's chart and labs.  Questions were answered to the patient's satisfaction.     Dalton [employer]

## 2015-04-23 NOTE — Discharge Instructions (Signed)
Call Dr Claris Gladden office if any bleeding,drainage,fever,pain,swelling, or redness of right arm site; call if any problems,questions, or concerns

## 2015-04-23 NOTE — H&P (View-Only) (Signed)
Patient ID: Morgan Bean, female   DOB: 01/28/1966, 49 y.o.   MRN: [id number] PCP: Dr. [name] Cardiology: Dr. Remy Voiles  49 yo with history of diastolic CHF, tricuspid regurgitation, and nonobstructive CAD presents for followup.  She developed pancytopenia probably from a cocaine contaminant.  She has been hospitalized with infections in the setting of neutropenia.  She has been treated for syphilis.  She is no longer using cocaine.  She has atypical chest pain, and Cardiolite in 2/16 was low risk with no ischemia.  Last echo in 1/17 showed EF 60-65%, moderate TR, PA systolic pressure 40 mmHg. PFTs in 10/16 showed moderate COPD.   She made today's appointment because she is feeling more short of breath than in the past.  Weight is up 8 lbs.  She is cutting back on cigarettes, down to 2 cigs/day.  She is very short of breath walking up steps.  This has worsened over the last few weeks.  She is short of breath walking about 1/2 block on flat ground.  No chest pain.  She has symptoms consistent with PND and sleeps on 3 pillows. No wheezing.   ECG (5/16) with NSR, nonspecific T wave flattening  Labs (9/12): HCT 31.5, BNP 132, K 4, creatinine 1.2 Labs (10/12): LDL 142, HDL 46, K 4.2, creatinine 1.3 Labs (4/14): K 4.3, creatinine 1.1, BNP 190, LDL 97, HDL 45 Labs (3/15): LDL 106, HDL 38 Labs (4/15): K 4, creatinine 0.8, WBCs 3.1, HCT 30.6, plts 157 Labs (3/16): K 3.6, creatinine 0.75, HIV negative, WBCs 3, HCT 29.8, plts 167 Labs (10/16): WBCs 2.7, HCT 33.6, plts 169  PMH: 1. Fe-deficiency anemia 2. HTN 3. Asthma 4. Active smoker 5. Prior cocaine 6. H/o CVA 2009. ?TIA in 7/15.  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  ANA positive.  Thought to be secondary to cocaine use.   8. Pulmonary hypertension/tricuspid regurgitation: Echo (9/11) with EF 65%, mild LVH, mild AI, mild MR, moderate to possibly severe TR with PA systolic pressure 52 mmHg.   Echo (10/12): severe LV hypertrophy, EF 60-65%, mild MR, mild AI, moderate to severe tricuspid regurgitation, PA systolic pressure 38 mmHg.  RHC (10/12) with mean RA 4, PA 28/9, mean PCWP 9, CI 3.8.  Echo (4/14) with EF 65%, moderate LVH, mild AI, mild MR, mild TR.  Echo (6/15) with EF 60-65%, grade II diastolic dysfunction, mild MR, normal RV size and systolic function, moderate TR, mild AI.  Echo (1/17) with EF 60-65%, mild LVH, mild MR, mild AI, moderate TR with PASP 40 mmHg.  9. Chest pain: Lexiscan myoview (10/12) with significant ST depression upon Lexiscan injection but no ischemia or infarction on perfusion images.  Left heart cath (10/12): 30% mid LAD, 30% ostial D1, 50% ostial D2.  Cardiolite (2/16) with EF 64%, small mild basal-mid inferolateral fixed defect, low risk/probably attenuation.   10. Diastolic CHF: Echo (4/14) with EF 60-65%, moderate LVH, mild AI, mild MR.  11. COPD: PFTs in 6/14 with no obstructive by spirometry but mild air-trapping.  Mild restriction as well. PFTs (10/16) with moderate COPD (worsened).  12. Pancytopenia: Bone marrow biopsy negative for lymphoma/leukemia.  Thought to be related to an agent in cocaine.  13. H/o syphilis  SH: Quit smoking in 3/15 but later restarted => now down to 2 cigs/day, has 1 daughter, unemployed.  Prior cocaine abuse.    FH: Grandmother with "heart problems."  Father with 2 open heart surgeries in his late   60s.  Brother with PCI at age 38.   ROS: All systems reviewed and negative except as per HPI.   Current Outpatient Prescriptions  Medication Sig Dispense Refill  . albuterol (PROVENTIL HFA;VENTOLIN HFA) 108 (90 BASE) MCG/ACT inhaler Inhale 2 puffs into the lungs every 2 (two) hours as needed for wheezing or shortness of breath (cough). 3 Inhaler 3  . amLODipine (NORVASC) 10 MG tablet Take 1 tablet (10 mg total) by mouth daily. 90 tablet 3  . atorvastatin (LIPITOR) 40 MG tablet TAKE 1 TABLET BY MOUTH IN THE EVENING 30 tablet 2  .  buPROPion (WELLBUTRIN SR) 150 MG 12 hr tablet Take 150 mg by mouth 2 (two) times daily.    . citalopram (CELEXA) 40 MG tablet Take 1 tablet (40 mg total) by mouth daily. 30 tablet 1  . cloNIDine (CATAPRES) 0.2 MG tablet Take 1 tablet (0.2 mg total) by mouth 2 (two) times daily. 180 tablet 3  . clopidogrel (PLAVIX) 75 MG tablet Take 1 tablet (75 mg total) by mouth daily. 90 tablet 3  . ferrous sulfate 325 (65 FE) MG tablet Take 650 mg by mouth daily with breakfast.    . furosemide (LASIX) 20 MG tablet Take 2 tablets (40 mg total) by mouth 2 (two) times daily. 30 tablet 3  . hydrOXYzine (ATARAX/VISTARIL) 25 MG tablet Take 25 mg by mouth at bedtime as needed (sleep).    . loratadine (CLARITIN) 10 MG tablet Take 1 tablet (10 mg total) by mouth daily. 30 tablet 3  . Multiple Vitamin (MULTIVITAMIN WITH MINERALS) TABS Take 1 tablet by mouth daily.    . omeprazole (PRILOSEC) 20 MG capsule TAKE 2 CAPSULES BY MOUTH DAILY 60 capsule 2  . polyethylene glycol powder (GLYCOLAX/MIRALAX) powder Take 17 g by mouth daily as needed (constipation).     . potassium chloride (K-DUR) 10 MEQ tablet Take 2 tabs (20 meQ) twice a day 120 tablet 2  . topiramate (TOPAMAX) 25 MG tablet Take 2 tablets at night 60 tablet 6  . traZODone (DESYREL) 100 MG tablet Take 1 tablet (100 mg total) by mouth at bedtime. 30 tablet 1   No current facility-administered medications for this encounter.    BP 124/80 mmHg  Pulse 76  Wt 220 lb 8 oz (100.018 kg)  SpO2 100%  LMP 04/24/2013 General: NAD Neck: JVP 8 cm, no thyromegaly or thyroid nodule.  Lungs: Slightly distant breath sounds.   CV: Nondisplaced PMI.  Heart regular S1/S2, no S3/S4, no murmur.  Trace ankle edema.  No carotid bruit.  Normal pedal pulses.  Abdomen: Soft, nontender, no hepatosplenomegaly, no distention.  Neurologic: Alert and oriented x 3.  Psych: Normal affect. Extremities: No clubbing or cyanosis.   Assessment/Plan:  1. Chronic diastolic CHF: NYHA class III  symptoms, worsened recently with possible PND and orthopnea.  On exam, she does not appear particularly volume overloaded, hard to tell if symptoms are due to COPD or CHF.  She has a history of diastolic CHF and TR, TR was moderate on 1/17 echo.  She did not have pulmonary hypertension on prior RHC in 2012.  However, PA pressure is mildly elevated by noninvasive evaluation on 1/17 echo.  - Empiric increase in Lasix to 40 mg bid with BMET/BNP today and repeat in a week. - I will arrange for RHC to assess filling pressures and for pulmonary hypertension.  This will help to differentiate dyspnea related to lung disease versus CHF/pulmonary hypertension.   We discussed risks/benefits of the procedure   and she agrees to proceed. 2. Hyperlipidemia: She is on a statin with nonobstructive coronary disease.   3. HTN: BP controlled.   4. Tricuspid regurgitation: Moderate on last echo.  Has not been documented to have significant pulmonary edema. As above, will repeat RHC to assess.  5. Smoking: She needs to quit.  She has cut down to 2 cigs/day, I strongly encouraged her to stop completely.  She has moderate COPD by PFTs.    Rollen Selders 04/18/2015   

## 2015-04-30 ENCOUNTER — Ambulatory Visit: Payer: No Typology Code available for payment source | Attending: Internal Medicine | Admitting: Internal Medicine

## 2015-04-30 ENCOUNTER — Encounter: Payer: Self-pay | Admitting: Internal Medicine

## 2015-04-30 VITALS — BP 127/88 | HR 80 | Temp 98.0°F | Resp 16 | Ht 66.0 in | Wt 223.4 lb

## 2015-04-30 DIAGNOSIS — I5032 Chronic diastolic (congestive) heart failure: Secondary | ICD-10-CM | POA: Insufficient documentation

## 2015-04-30 DIAGNOSIS — Z8673 Personal history of transient ischemic attack (TIA), and cerebral infarction without residual deficits: Secondary | ICD-10-CM | POA: Insufficient documentation

## 2015-04-30 DIAGNOSIS — I10 Essential (primary) hypertension: Secondary | ICD-10-CM | POA: Insufficient documentation

## 2015-04-30 DIAGNOSIS — M1990 Unspecified osteoarthritis, unspecified site: Secondary | ICD-10-CM | POA: Insufficient documentation

## 2015-04-30 DIAGNOSIS — J45909 Unspecified asthma, uncomplicated: Secondary | ICD-10-CM | POA: Insufficient documentation

## 2015-04-30 DIAGNOSIS — E785 Hyperlipidemia, unspecified: Secondary | ICD-10-CM | POA: Insufficient documentation

## 2015-04-30 DIAGNOSIS — J449 Chronic obstructive pulmonary disease, unspecified: Secondary | ICD-10-CM | POA: Insufficient documentation

## 2015-04-30 DIAGNOSIS — Z79899 Other long term (current) drug therapy: Secondary | ICD-10-CM | POA: Insufficient documentation

## 2015-04-30 DIAGNOSIS — F329 Major depressive disorder, single episode, unspecified: Secondary | ICD-10-CM | POA: Insufficient documentation

## 2015-04-30 DIAGNOSIS — Z888 Allergy status to other drugs, medicaments and biological substances status: Secondary | ICD-10-CM | POA: Insufficient documentation

## 2015-04-30 DIAGNOSIS — F17210 Nicotine dependence, cigarettes, uncomplicated: Secondary | ICD-10-CM | POA: Insufficient documentation

## 2015-04-30 DIAGNOSIS — D539 Nutritional anemia, unspecified: Secondary | ICD-10-CM | POA: Insufficient documentation

## 2015-04-30 DIAGNOSIS — F17200 Nicotine dependence, unspecified, uncomplicated: Secondary | ICD-10-CM

## 2015-04-30 DIAGNOSIS — D638 Anemia in other chronic diseases classified elsewhere: Secondary | ICD-10-CM | POA: Insufficient documentation

## 2015-04-30 DIAGNOSIS — I2510 Atherosclerotic heart disease of native coronary artery without angina pectoris: Secondary | ICD-10-CM | POA: Insufficient documentation

## 2015-04-30 MED ORDER — FLUTICASONE-SALMETEROL 100-50 MCG/DOSE IN AEPB: 1.0000 | INHALATION_SPRAY | Freq: Two times a day (BID) | RESPIRATORY_TRACT | Status: DC

## 2015-04-30 NOTE — Progress Notes (Signed)
Patient states she has copd and asthma Patient is requesting a stronger inhaler States where she lives now there are a lot of steps And finding herself short winded

## 2015-04-30 NOTE — Progress Notes (Signed)
Patient ID: Morgan Bean, female   DOB: February 09, 1966, 50 y.o.   MRN: [id number]  CC: COPD  HPI: Morgan Bean is a 50 y.o. female here today for a follow up visit.  Patient has past medical history of CHF, HTN, tobacco use, CAD, prior cocaine use, CVA, and HLD. Patient reports that she has been having increased SOB with minimal exertion. When she feels SOB she feels dizzy. Her daughter states that the patient has suffered several falls due to feelings of fatigued. She states that she was seen by her Cardiologist who performed PFT's that revealed moderate COPD. She also had a cardiac cath that did not show pulmonary hypertension. She states that for the last 6 months she has cut back to 2 cigarettes per day.    Allergies  Allergen Reactions  . Ciprofloxacin Itching   Past Medical History  Diagnosis Date  . Coronary artery disease     Lexiscan myoview (10/12) with significant ST depression upon Lexiscan injection but no ischemia or infarction on perfusion images. Left heart cath (10/12): 30% mid LAD, 30% ostial D1, 50% ostial D2.    [name] Hypertension   . Tricuspid regurgitation   . Iron deficiency     hx of  . Polysubstance abuse     Prior cocaine  . Tobacco abuse   . Leukocytoclastic vasculitis ([place])     Rash across lower body, occurred in 9/11, diagnosed by skin biopsy. ANA positive. Thought to be secondary to cocaine use.  . Pulmonary HTN ([place])     Echo (9/11) with EF 65%, mild LVH, mild AI, mild MR, moderate to possibly severe TR with PA systolic pressure 52 mmHg. Echo (10/12): severe LV hypertrophy, EF 60-65%, mild MR, mild AI, moderate to severe tricuspid regurgitation, PA systolic pressure 38 mmHg.  Echo 4/14: moderate LVH, EF 65%, normal wall motion, diastolic dysfunction, mild AI, mild MR  . Asthma   . Shortness of breath     a. PFTs 5/14:  FEV1/FVC 99% predicted; FVC 60% predicted; DLCO mildly reduced, mild restriction, air trapping.;  b.  seen by pulmo (Dr. [name]) 5/14: mainly upper  airway symptoms - ACE d/c'd (sx's better)  . Chronic diastolic CHF (congestive heart failure) ([place])   . Constipation   . Lymphadenopathy 03/21/2013  . Leukopenia 03/21/2013  . Unspecified deficiency anemia 03/29/2013  . Anginal pain ([place])     none in past year  . Anxiety   . Mental disorder   . Depression   . Stroke ([place]) 2010ish  . Arthritis   . Pancytopenia ([place])   . Hyperlipemia   . Syphilis 04/25/2013  . Candidiasis 06/23/2013  . Anemia of other chronic disease 11/13/2013  . Cough 11/13/2013  . Lymphadenopathy 01/30/2014  . Headache 02/22/2014  . Hx of cardiovascular stress test     Lexiscan Myoview (2/16): Breast attenuation artifact, no ischemia, EF 64%; low risk   Current Outpatient Prescriptions on File Prior to Visit  Medication Sig Dispense Refill  . albuterol (PROVENTIL HFA;VENTOLIN HFA) 108 (90 BASE) MCG/ACT inhaler Inhale 2 puffs into the lungs every 2 (two) hours as needed for wheezing or shortness of breath (cough). 3 Inhaler 3  . amLODipine (NORVASC) 10 MG tablet Take 1 tablet (10 mg total) by mouth daily. 90 tablet 3  . atorvastatin (LIPITOR) 40 MG tablet TAKE 1 TABLET BY MOUTH IN THE EVENING 30 tablet 2  . buPROPion (WELLBUTRIN SR) 150 MG 12 hr tablet Take 150 mg by mouth 2 (two) times daily.    [name]  citalopram (CELEXA) 40 MG tablet Take 1 tablet (40 mg total) by mouth daily. 30 tablet 1  . cloNIDine (CATAPRES) 0.2 MG tablet Take 1 tablet (0.2 mg total) by mouth 2 (two) times daily. 180 tablet 3  . clopidogrel (PLAVIX) 75 MG tablet Take 1 tablet (75 mg total) by mouth daily. 90 tablet 3  . ferrous sulfate 325 (65 FE) MG tablet Take 650 mg by mouth daily with breakfast.    . furosemide (LASIX) 20 MG tablet Take 2 tablets (40 mg total) by mouth 2 (two) times daily. 30 tablet 3  . hydrOXYzine (ATARAX/VISTARIL) 25 MG tablet Take 25 mg by mouth at bedtime as needed (sleep).    . loratadine (CLARITIN) 10 MG tablet Take 1 tablet (10 mg total) by mouth daily. 30 tablet 3  .  Multiple Vitamin (MULTIVITAMIN WITH MINERALS) TABS Take 1 tablet by mouth daily.    [name] omeprazole (PRILOSEC) 20 MG capsule TAKE 2 CAPSULES BY MOUTH DAILY 60 capsule 2  . potassium chloride (K-DUR) 10 MEQ tablet Take 2 tabs (20 meQ) twice a day 120 tablet 2  . topiramate (TOPAMAX) 25 MG tablet Take 2 tablets at night 60 tablet 6  . traZODone (DESYREL) 100 MG tablet Take 1 tablet (100 mg total) by mouth at bedtime. 30 tablet 1  . polyethylene glycol powder (GLYCOLAX/MIRALAX) powder Take 17 g by mouth daily as needed (constipation).      No current facility-administered medications on file prior to visit.   Family History  Problem Relation Age of Onset  . Coronary artery disease Mother   . Diabetes Mother   . Breast cancer Maternal Grandmother   . Diabetes Maternal Grandmother   . Diabetes Father   . Hypertension Father   . Coronary artery disease Father   . Heart attack Maternal Grandmother   . Stroke Maternal Grandmother   . Heart attack Father    Social History   Social History  . Marital Status: Single    Spouse Name: N/A  . Number of Children: 2  . Years of Education: N/A   Occupational History  . unemployed    Social History Main Topics  . Smoking status: Current Every Day Smoker -- 0.25 packs/day for 30 years    Types: Cigarettes  . Smokeless tobacco: Never Used  . Alcohol Use: No     Comment: pint of wine 2-3 times a week--ocassional - quit 2012  . Drug Use: 1.00 per week    Special: "Crack" cocaine     Comment: none since 02/04/14  . Sexual Activity: Yes   Other Topics Concern  . Not on file   Social History Narrative   Lives with mom, sisters and brothers.    Review of Systems: Other than what is stated in HPI, all other systems are negative.   Objective:   Filed Vitals:   04/30/15 1709  BP: 127/88  Pulse: 80  Temp: 98 F (36.7 C)  Resp: 16    Physical Exam  Constitutional: She is oriented to person, place, and time.  Cardiovascular: Normal  rate, regular rhythm and normal heart sounds.   Pulmonary/Chest: Effort normal and breath sounds normal.  Musculoskeletal: She exhibits no edema.  Neurological: She is alert and oriented to person, place, and time. No cranial nerve deficit.  Skin: Skin is warm and dry.  Psychiatric: She has a normal mood and affect.     Lab Results  Component Value Date   WBC 4.7 04/18/2015   HGB 11.8*  04/18/2015   HCT 37.0 04/18/2015   MCV 81.9 04/18/2015   PLT 152 04/18/2015   Lab Results  Component Value Date   CREATININE 1.05* 04/18/2015   BUN 14 04/18/2015   NA 137 04/18/2015   K 4.2 04/18/2015   CL 109 04/18/2015   CO2 21* 04/18/2015    Lab Results  Component Value Date   HGBA1C 5.2 10/04/2013   Lipid Panel     Component Value Date/Time   CHOL 149 01/02/2014 0826   TRIG 91.0 01/02/2014 0826   HDL 47.80 01/02/2014 0826   CHOLHDL 3 01/02/2014 0826   VLDL 18.2 01/02/2014 0826   LDLCALC 83 01/02/2014 0826       Assessment and plan:   Averiana was seen today for follow-up.  Diagnoses and all orders for this visit:  Chronic obstructive pulmonary disease, unspecified COPD type ([place]) -    Begin Fluticasone-Salmeterol (ADVAIR) 100-50 MCG/DOSE AEPB; Inhale 1 puff into the lungs 2 (two) times daily. I have referred patient to Dr. Dannielle Burn who will be here Wednesday. I will start her on Advair and let her follow up with Pulm. Discussed importance of smoking cessation.  6 minute walk test in office--sats only dropped to 96%.   Tobacco use Smoking cessation discussed for 3 minutes, patient is not willing to quit at this time. Will continue to assess on each visit. Discussed increased risk for diseases such as cancer, heart disease, and stroke.     Return for 1 week with Dr. Tami Ribas and 3 mo PCP.       Lance Bosch, [facility] [phone] 04/30/2015, 5:31 PM

## 2015-05-03 ENCOUNTER — Ambulatory Visit: Payer: No Typology Code available for payment source

## 2015-05-08 ENCOUNTER — Encounter: Payer: Self-pay | Admitting: Critical Care Medicine

## 2015-05-08 ENCOUNTER — Ambulatory Visit
Payer: No Typology Code available for payment source | Attending: Critical Care Medicine | Admitting: Critical Care Medicine

## 2015-05-08 VITALS — BP 138/94 | HR 81 | Temp 98.3°F | Resp 18 | Ht 66.0 in | Wt 220.0 lb

## 2015-05-08 DIAGNOSIS — F329 Major depressive disorder, single episode, unspecified: Secondary | ICD-10-CM | POA: Insufficient documentation

## 2015-05-08 DIAGNOSIS — E785 Hyperlipidemia, unspecified: Secondary | ICD-10-CM | POA: Insufficient documentation

## 2015-05-08 DIAGNOSIS — I2510 Atherosclerotic heart disease of native coronary artery without angina pectoris: Secondary | ICD-10-CM | POA: Insufficient documentation

## 2015-05-08 DIAGNOSIS — D638 Anemia in other chronic diseases classified elsewhere: Secondary | ICD-10-CM | POA: Insufficient documentation

## 2015-05-08 DIAGNOSIS — Z8673 Personal history of transient ischemic attack (TIA), and cerebral infarction without residual deficits: Secondary | ICD-10-CM | POA: Insufficient documentation

## 2015-05-08 DIAGNOSIS — J45909 Unspecified asthma, uncomplicated: Secondary | ICD-10-CM | POA: Insufficient documentation

## 2015-05-08 DIAGNOSIS — F1490 Cocaine use, unspecified, uncomplicated: Secondary | ICD-10-CM | POA: Insufficient documentation

## 2015-05-08 DIAGNOSIS — D539 Nutritional anemia, unspecified: Secondary | ICD-10-CM | POA: Insufficient documentation

## 2015-05-08 DIAGNOSIS — R51 Headache: Secondary | ICD-10-CM | POA: Insufficient documentation

## 2015-05-08 DIAGNOSIS — F17210 Nicotine dependence, cigarettes, uncomplicated: Secondary | ICD-10-CM | POA: Insufficient documentation

## 2015-05-08 DIAGNOSIS — F17200 Nicotine dependence, unspecified, uncomplicated: Secondary | ICD-10-CM

## 2015-05-08 DIAGNOSIS — I10 Essential (primary) hypertension: Secondary | ICD-10-CM | POA: Insufficient documentation

## 2015-05-08 DIAGNOSIS — Z79899 Other long term (current) drug therapy: Secondary | ICD-10-CM | POA: Insufficient documentation

## 2015-05-08 DIAGNOSIS — J4489 Other specified chronic obstructive pulmonary disease: Secondary | ICD-10-CM | POA: Insufficient documentation

## 2015-05-08 DIAGNOSIS — J449 Chronic obstructive pulmonary disease, unspecified: Secondary | ICD-10-CM

## 2015-05-08 DIAGNOSIS — I272 Other secondary pulmonary hypertension: Secondary | ICD-10-CM | POA: Insufficient documentation

## 2015-05-08 DIAGNOSIS — M1990 Unspecified osteoarthritis, unspecified site: Secondary | ICD-10-CM | POA: Insufficient documentation

## 2015-05-08 DIAGNOSIS — I5032 Chronic diastolic (congestive) heart failure: Secondary | ICD-10-CM | POA: Insufficient documentation

## 2015-05-08 MED ORDER — FLUTICASONE-SALMETEROL 250-50 MCG/DOSE IN AEPB: 1.0000 | INHALATION_SPRAY | Freq: Two times a day (BID) | RESPIRATORY_TRACT | Status: DC

## 2015-05-08 MED ORDER — FLUTICASONE PROPIONATE 50 MCG/ACT NA SUSP: 2.0000 | Freq: Every day | NASAL | Status: DC

## 2015-05-08 NOTE — Assessment & Plan Note (Signed)
Chronic obstructive lung disease with chronic bronchitic component  ex-smoker at this time No significant desaturation seen Previous history of cocaine use Diastolic heart failure with mild pulmonary hypertension stable Patient's HFA technique was not adequate and also DPI technique needed coaching Plan Increase Advair to 250 one puff twice a day and the patient was coached as to proper DPI technique Patient was coached as to proper HFA technique with albuterol use as needed No systemic steroids or antibiotics indicated at this time No oxygen therapy needed at this time Patient continue to follow-up with cardiology We'll also add Flonase 2 sprays each nostril daily

## 2015-05-08 NOTE — Patient Instructions (Addendum)
Continue to focus on smoking cessation Change advair to 250 one puff twice daily, slow deep inhalation Albuterol as needed Start fluticasone two puff ea nostril daily No other medication changes Return 3 months

## 2015-05-08 NOTE — Progress Notes (Signed)
Subjective:    Patient ID: Morgan Bean, female    DOB: 04/29/1965, 50 y.o.   MRN: [id number]  HPI Comments: Here for Copd eval and f/u.   Hx of dyspnea and cough > 32yrs.  Dx with mod Copd few months. No recent hosp stays.     Shortness of Breath This is a chronic problem. The problem occurs daily (exertion only, 1 flight of steps and has to stop). The problem has been gradually worsening. Associated symptoms include chest pain, ear pain, a fever, headaches, leg swelling, orthopnea, PND, rhinorrhea, a sore throat, sputum production and wheezing. Pertinent negatives include no abdominal pain, coryza, hemoptysis, leg pain, rash or syncope. The symptoms are aggravated by weather changes, fumes, odors, lying flat, eating, any activity, exercise and emotional upset. Associated symptoms comments: T 101 recently Mucus is clear  Chest pain is sharp and tight. Risk factors include smoking. She has tried beta agonist inhalers for the symptoms. The treatment provided moderate relief. Her past medical history is significant for asthma, CAD, COPD and a heart failure. There is no history of allergies, DVT, PE, pneumonia or a recent surgery. (Prev AMI and CVA)   Past Medical History  Diagnosis Date  . Coronary artery disease     Lexiscan myoview (10/12) with significant ST depression upon Lexiscan injection but no ischemia or infarction on perfusion images. Left heart cath (10/12): 30% mid LAD, 30% ostial D1, 50% ostial D2.    [name] Hypertension   . Tricuspid regurgitation   . Iron deficiency     hx of  . Polysubstance abuse     Prior cocaine  . Tobacco abuse   . Leukocytoclastic vasculitis ([place])     Rash across lower body, occurred in 9/11, diagnosed by skin biopsy. ANA positive. Thought to be secondary to cocaine use.  . Pulmonary HTN ([place])     Echo (9/11) with EF 65%, mild LVH, mild AI, mild MR, moderate to possibly severe TR with PA systolic pressure 52 mmHg. Echo (10/12): severe LV hypertrophy, EF  60-65%, mild MR, mild AI, moderate to severe tricuspid regurgitation, PA systolic pressure 38 mmHg.  Echo 4/14: moderate LVH, EF 65%, normal wall motion, diastolic dysfunction, mild AI, mild MR  . Asthma   . Shortness of breath     a. PFTs 5/14:  FEV1/FVC 99% predicted; FVC 60% predicted; DLCO mildly reduced, mild restriction, air trapping.;  b.  seen by pulmo (Dr. [name]) 5/14: mainly upper airway symptoms - ACE d/c'd (sx's better)  . Chronic diastolic CHF (congestive heart failure) ([place])   . Constipation   . Lymphadenopathy 03/21/2013  . Leukopenia 03/21/2013  . Unspecified deficiency anemia 03/29/2013  . Anginal pain ([place])     none in past year  . Anxiety   . Mental disorder   . Depression   . Stroke ([place]) 2010ish  . Arthritis   . Pancytopenia ([place])   . Hyperlipemia   . Syphilis 04/25/2013  . Candidiasis 06/23/2013  . Anemia of other chronic disease 11/13/2013  . Cough 11/13/2013  . Lymphadenopathy 01/30/2014  . Headache 02/22/2014  . Hx of cardiovascular stress test     Lexiscan Myoview (2/16): Breast attenuation artifact, no ischemia, EF 64%; low risk     Family History  Problem Relation Age of Onset  . Coronary artery disease Mother   . Diabetes Mother   . Breast cancer Maternal Grandmother   . Diabetes Maternal Grandmother   . Diabetes Father   . Hypertension Father   .  Coronary artery disease Father   . Heart attack Maternal Grandmother   . Stroke Maternal Grandmother   . Heart attack Father      Social History   Social History  . Marital Status: Single    Spouse Name: N/A  . Number of Children: 2  . Years of Education: N/A   Occupational History  . unemployed    Social History Main Topics  . Smoking status: Current Every Day Smoker -- 0.00 packs/day for 30 years    Types: Cigarettes  . Smokeless tobacco: Never Used  . Alcohol Use: No     Comment: pint of wine 2-3 times a week--ocassional - quit 2012  . Drug Use: 1.00 per week    Special: "Crack" cocaine      Comment: none since 02/04/14  . Sexual Activity: Yes   Other Topics Concern  . Not on file   Social History Narrative   Lives with mom, sisters and brothers.     Allergies  Allergen Reactions  . Ciprofloxacin Itching     Outpatient Prescriptions Prior to Visit  Medication Sig Dispense Refill  . albuterol (PROVENTIL HFA;VENTOLIN HFA) 108 (90 BASE) MCG/ACT inhaler Inhale 2 puffs into the lungs every 2 (two) hours as needed for wheezing or shortness of breath (cough). 3 Inhaler 3  . amLODipine (NORVASC) 10 MG tablet Take 1 tablet (10 mg total) by mouth daily. 90 tablet 3  . atorvastatin (LIPITOR) 40 MG tablet TAKE 1 TABLET BY MOUTH IN THE EVENING 30 tablet 2  . buPROPion (WELLBUTRIN SR) 150 MG 12 hr tablet Take 150 mg by mouth 2 (two) times daily.    . citalopram (CELEXA) 40 MG tablet Take 1 tablet (40 mg total) by mouth daily. 30 tablet 1  . cloNIDine (CATAPRES) 0.2 MG tablet Take 1 tablet (0.2 mg total) by mouth 2 (two) times daily. 180 tablet 3  . clopidogrel (PLAVIX) 75 MG tablet Take 1 tablet (75 mg total) by mouth daily. 90 tablet 3  . ferrous sulfate 325 (65 FE) MG tablet Take 650 mg by mouth daily with breakfast.    . furosemide (LASIX) 20 MG tablet Take 2 tablets (40 mg total) by mouth 2 (two) times daily. 30 tablet 3  . hydrOXYzine (ATARAX/VISTARIL) 25 MG tablet Take 25 mg by mouth at bedtime as needed (sleep).    . loratadine (CLARITIN) 10 MG tablet Take 1 tablet (10 mg total) by mouth daily. 30 tablet 3  . Multiple Vitamin (MULTIVITAMIN WITH MINERALS) TABS Take 1 tablet by mouth daily.    [name] omeprazole (PRILOSEC) 20 MG capsule TAKE 2 CAPSULES BY MOUTH DAILY 60 capsule 2  . polyethylene glycol powder (GLYCOLAX/MIRALAX) powder Take 17 g by mouth daily as needed (constipation).     . potassium chloride (K-DUR) 10 MEQ tablet Take 2 tabs (20 meQ) twice a day 120 tablet 2  . topiramate (TOPAMAX) 25 MG tablet Take 2 tablets at night 60 tablet 6  . traZODone (DESYREL) 100 MG tablet  Take 1 tablet (100 mg total) by mouth at bedtime. 30 tablet 1  . Fluticasone-Salmeterol (ADVAIR) 100-50 MCG/DOSE AEPB Inhale 1 puff into the lungs 2 (two) times daily. 14 each 3   No facility-administered medications prior to visit.    Review of Systems  Constitutional: Positive for fever and fatigue.  HENT: Positive for ear pain, postnasal drip, rhinorrhea, sinus pressure, sneezing, sore throat, trouble swallowing and voice change.   Respiratory: Positive for cough, sputum production, chest tightness, shortness of  breath and wheezing. Negative for hemoptysis.   Cardiovascular: Positive for chest pain, orthopnea, leg swelling and PND. Negative for syncope.  Gastrointestinal: Negative for abdominal pain.       No gerd  Skin: Negative for rash.  Neurological: Positive for headaches.       Objective:   Physical Exam  Filed Vitals:   05/08/15 1718  BP: 138/94  Pulse: 81  Temp: 98.3 F (36.8 C)  TempSrc: Oral  Resp: 18  Height: 5\' 6"  (1.676 m)  Weight: 220 lb (99.791 kg)  SpO2: 96%    Gen: Pleasant, well-nourished, in no distress,  normal affect  ENT: No lesions,  mouth clear,  oropharynx clear, no postnasal drip  Neck: No JVD, no TMG, no carotid bruits  Lungs: No use of accessory muscles, no dullness to percussion, distant BS  Cardiovascular: RRR, heart sounds normal, no murmur or gallops, no peripheral edema  Abdomen: soft and NT, no HSM,  BS normal  Musculoskeletal: No deformities, no cyanosis or clubbing  Neuro: alert, non focal  Skin: Warm, no lesions or rashes  No results found.  PFTs reviewed from 2014 and 2016  Moderate obstruction Dlco low at 47%      Assessment & Plan:  I personally reviewed all images and lab data in the [facility] system as well as any outside material available during this office visit and agree with the  radiology impressions.   COPD with chronic bronchitis ([name]) Chronic obstructive lung disease with chronic bronchitic component   ex-smoker at this time No significant desaturation seen Previous history of cocaine use Diastolic heart failure with mild pulmonary hypertension stable Patient's HFA technique was not adequate and also DPI technique needed coaching Plan Increase Advair to 250 one puff twice a day and the patient was coached as to proper DPI technique Patient was coached as to proper HFA technique with albuterol use as needed No systemic steroids or antibiotics indicated at this time No oxygen therapy needed at this time Patient continue to follow-up with cardiology We'll also add Flonase 2 sprays each nostril daily    Shaneque was seen today for copd.  Diagnoses and all orders for this visit:  COPD with chronic bronchitis ([place])  Tobacco use disorder  Other orders -     Fluticasone-Salmeterol (ADVAIR) 250-50 MCG/DOSE AEPB; Inhale 1 puff into the lungs 2 (two) times daily. -     fluticasone (FLONASE) 50 MCG/ACT nasal spray; Place 2 sprays into both nostrils daily.

## 2015-05-08 NOTE — Progress Notes (Signed)
Patient is here for COPD FU  Patient denies any pain at this time.  Patient experienced SOB after celebrating some lost weight.

## 2015-05-10 ENCOUNTER — Ambulatory Visit (HOSPITAL_COMMUNITY): Admission: RE | Admit: 2015-05-10 | Payer: No Typology Code available for payment source | Source: Ambulatory Visit

## 2015-06-10 ENCOUNTER — Inpatient Hospital Stay (HOSPITAL_COMMUNITY): Admission: RE | Admit: 2015-06-10 | Payer: No Typology Code available for payment source | Source: Ambulatory Visit

## 2015-06-19 ENCOUNTER — Other Ambulatory Visit: Payer: Self-pay | Admitting: Cardiology

## 2015-06-20 ENCOUNTER — Other Ambulatory Visit: Payer: Self-pay | Admitting: Internal Medicine

## 2015-06-27 ENCOUNTER — Other Ambulatory Visit: Payer: No Typology Code available for payment source

## 2015-06-27 ENCOUNTER — Ambulatory Visit: Payer: No Typology Code available for payment source | Admitting: Hematology and Oncology

## 2015-07-10 ENCOUNTER — Other Ambulatory Visit: Payer: Self-pay | Admitting: Pharmacist

## 2015-07-10 MED ORDER — ALBUTEROL SULFATE HFA 108 (90 BASE) MCG/ACT IN AERS: 2.0000 | INHALATION_SPRAY | Freq: Four times a day (QID) | RESPIRATORY_TRACT | Status: DC | PRN

## 2015-07-15 ENCOUNTER — Ambulatory Visit: Payer: No Typology Code available for payment source | Admitting: Neurology

## 2015-07-16 ENCOUNTER — Encounter: Payer: Self-pay | Admitting: Hematology and Oncology

## 2015-07-16 ENCOUNTER — Telehealth: Payer: Self-pay | Admitting: Hematology and Oncology

## 2015-07-16 ENCOUNTER — Ambulatory Visit (HOSPITAL_BASED_OUTPATIENT_CLINIC_OR_DEPARTMENT_OTHER): Payer: Self-pay | Admitting: Hematology and Oncology

## 2015-07-16 ENCOUNTER — Other Ambulatory Visit (HOSPITAL_BASED_OUTPATIENT_CLINIC_OR_DEPARTMENT_OTHER): Payer: Self-pay

## 2015-07-16 VITALS — BP 116/81 | HR 85 | Temp 98.7°F | Resp 18 | Ht 66.0 in | Wt 209.3 lb

## 2015-07-16 DIAGNOSIS — Z720 Tobacco use: Secondary | ICD-10-CM

## 2015-07-16 DIAGNOSIS — D61818 Other pancytopenia: Secondary | ICD-10-CM

## 2015-07-16 DIAGNOSIS — R599 Enlarged lymph nodes, unspecified: Secondary | ICD-10-CM

## 2015-07-16 DIAGNOSIS — D708 Other neutropenia: Secondary | ICD-10-CM

## 2015-07-16 DIAGNOSIS — R591 Generalized enlarged lymph nodes: Secondary | ICD-10-CM

## 2015-07-16 HISTORY — DX: Other pancytopenia: D61.818

## 2015-07-16 LAB — CBC WITH DIFFERENTIAL/PLATELET
BASO%: 1.2 % (ref 0.0–2.0)
BASOS ABS: 0 10*3/uL (ref 0.0–0.1)
EOS%: 1.2 % (ref 0.0–7.0)
Eosinophils Absolute: 0 10*3/uL (ref 0.0–0.5)
HEMATOCRIT: 31.8 % — AB (ref 34.8–46.6)
HGB: 10 g/dL — ABNORMAL LOW (ref 11.6–15.9)
LYMPH#: 1.8 10*3/uL (ref 0.9–3.3)
LYMPH%: 60.4 % — AB (ref 14.0–49.7)
MCH: 24.5 pg — AB (ref 25.1–34.0)
MCHC: 31.4 g/dL — AB (ref 31.5–36.0)
MCV: 78.2 fL — ABNORMAL LOW (ref 79.5–101.0)
MONO#: 0.8 10*3/uL (ref 0.1–0.9)
MONO%: 28 % — AB (ref 0.0–14.0)
NEUT#: 0.3 10*3/uL — CL (ref 1.5–6.5)
NEUT%: 9.2 % — AB (ref 38.4–76.8)
Platelets: 275 10*3/uL (ref 145–400)
RBC: 4.07 10*6/uL (ref 3.70–5.45)
RDW: 14.9 % — ABNORMAL HIGH (ref 11.2–14.5)
WBC: 3 10*3/uL — ABNORMAL LOW (ref 3.9–10.3)

## 2015-07-16 NOTE — Telephone Encounter (Signed)
Gave pt appt for July & avs °

## 2015-07-16 NOTE — Assessment & Plan Note (Signed)
The patient is attempting to quit smoking.  I reinforced the importance of smoking cessation.

## 2015-07-16 NOTE — Assessment & Plan Note (Signed)
She was referred to ENT 2 years ago with negative lymph node biopsy. The lymph node in the neck is stable. This is likely reactive to the underlying hematology/chronic neutropenia. I will observe for now.

## 2015-07-16 NOTE — Assessment & Plan Note (Signed)
She has persistent, severe neutropenia, unresponsive to prednisone and recent G-CSF. She had multiple bone marrow biopsy performed in the past and overall consensus from multiple subspecialties were this is related to cocaine abuse. The patient denies cocaine abuse for over 12 months. Previously, she had nonspecific lymphadenopathy and lymph node biopsy was non-diagnostic. Repeat CT scan show nonspecific lymphadenopathy that overall is improving. After review of her case at the most recent hematology tumor board, I recommend a trial of IVIG.  She has no response to IVIG. We discussed other options including rituximab, observation, or referral to tertiary center. Due to lack of symptoms, the patient elected for observation only and I think it is reasonable. I will see her back in 3 months with repeat blood work history and physical examination. I reinforced the importance of hand hygiene and close communication with me. Should she suspect signs of infection, she will call me immediately for further evaluation and management Today, I recommend high-dose vitamin B-12 supplement by mouth along with oral iron daily

## 2015-07-16 NOTE — Progress Notes (Signed)
[facility] OFFICE PROGRESS NOTE  Lance Bosch, NP SUMMARY OF HEMATOLOGIC HISTORY:  This is a pleasant lady who is being referred here because of background of severe pancytopenia, lymphadenopathy and abnormal lymph node and bone marrow biopsy. Further evaluation suspect the patient may have syphilis. She was treated with doxycycline with worsening pancytopenia. Her antibiotic treatment was discontinued. From 06/02/2013 to 06/09/2013, the patient was admitted to the hospital for further workup for neutropenic fever. She was placed on G-CSF with no response to therapy. Repeat bone marrow biopsy was done. The patient was given broad-spectrum intravenous antibiotics and subsequently discharged for further followup as an outpatient.  On 06/14/2013, I started her on prednisone 60 mg daily. She was readmitted to the hospital again for neutropenic fever. 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 On 08/04/2013, I advised further prednisone taper. She is given G-CSF for severe neutropenia with no response. She was subsequently observed. In August 2015, she was noted to a recurrence of severe neutropenia. She was sent back on prednisone with initial good response to treatment but then relapsed soon after initiation of prednisone taper  on 02/20/2014, she was given IVIG treatment. On 03/06/2014, prednisone taper is initiated to 2.5 mg daily, subsequently taper off.  INTERVAL HISTORY: Morgan Bean 50 y.o. female returns for further follow-up. She continues to smoke intermittently. She has stable lymphadenopathy on the left side of the neck. She has mild intermittent cough Denies recent pneumonia or severe infection She complained of fatigue and is not taking her iron supplement consistently The patient denies any recent signs or symptoms of bleeding such as spontaneous epistaxis, hematuria or hematochezia.   I have reviewed the  past medical history, past surgical history, social history and family history with the patient and they are unchanged from previous note.  ALLERGIES:  is allergic to ciprofloxacin.  MEDICATIONS:  Current Outpatient Prescriptions  Medication Sig Dispense Refill  . albuterol (PROVENTIL HFA;VENTOLIN HFA) 108 (90 Base) MCG/ACT inhaler Inhale 2 puffs into the lungs every 6 (six) hours as needed for wheezing or shortness of breath. 1 Inhaler 3  . amLODipine (NORVASC) 10 MG tablet Take 1 tablet (10 mg total) by mouth daily. 90 tablet 3  . atorvastatin (LIPITOR) 40 MG tablet TAKE 1 TABLET BY MOUTH IN THE EVENING 30 tablet 2  . buPROPion (WELLBUTRIN SR) 150 MG 12 hr tablet Take 150 mg by mouth 2 (two) times daily.    . citalopram (CELEXA) 40 MG tablet Take 1 tablet (40 mg total) by mouth daily. 30 tablet 1  . cloNIDine (CATAPRES) 0.2 MG tablet Take 1 tablet (0.2 mg total) by mouth 2 (two) times daily. 180 tablet 3  . clopidogrel (PLAVIX) 75 MG tablet Take 1 tablet (75 mg total) by mouth daily. 90 tablet 3  . ferrous sulfate 325 (65 FE) MG tablet Take 650 mg by mouth daily with breakfast.    . fluticasone (FLONASE) 50 MCG/ACT nasal spray Place 2 sprays into both nostrils daily. 16 g 6  . Fluticasone-Salmeterol (ADVAIR) 250-50 MCG/DOSE AEPB Inhale 1 puff into the lungs 2 (two) times daily. 1 each 6  . furosemide (LASIX) 20 MG tablet Take 2 tablets (40 mg total) by mouth 2 (two) times daily. 30 tablet 3  . hydrOXYzine (ATARAX/VISTARIL) 25 MG tablet Take 25 mg by mouth at bedtime as needed (sleep).    . loratadine (CLARITIN) 10 MG tablet Take 1 tablet (10 mg total) by  mouth daily. 30 tablet 3  . Multiple Vitamin (MULTIVITAMIN WITH MINERALS) TABS Take 1 tablet by mouth daily.    [name] omeprazole (PRILOSEC) 20 MG capsule TAKE 2 CAPSULES BY MOUTH DAILY 60 capsule 2  . polyethylene glycol powder (GLYCOLAX/MIRALAX) powder Take 17 g by mouth daily as needed (constipation).     . potassium chloride (K-DUR) 10 MEQ  tablet TAKE 2 TABLETS BY MOUTH TWICE A DAY 120 tablet 2  . topiramate (TOPAMAX) 25 MG tablet Take 2 tablets at night 60 tablet 6  . traZODone (DESYREL) 100 MG tablet Take 1 tablet (100 mg total) by mouth at bedtime. 30 tablet 1   No current facility-administered medications for this visit.     REVIEW OF SYSTEMS:   Constitutional: Denies fevers, chills or night sweats Eyes: Denies blurriness of vision Ears, nose, mouth, throat, and face: Denies mucositis or sore throat Respiratory: Denies cough, dyspnea or wheezes Cardiovascular: Denies palpitation, chest discomfort or lower extremity swelling Gastrointestinal:  Denies nausea, heartburn or change in bowel habits Skin: Denies abnormal skin rashes Neurological:Denies numbness, tingling or new weaknesses Behavioral/Psych: Mood is stable, no new changes  All other systems were reviewed with the patient and are negative.  PHYSICAL EXAMINATION: ECOG PERFORMANCE STATUS: 1 - Symptomatic but completely ambulatory  Filed Vitals:   07/16/15 1425  BP: 116/81  Pulse: 85  Temp: 98.7 F (37.1 C)  Resp: 18   Filed Weights   07/16/15 1425  Weight: 209 lb 4.8 oz (94.938 kg)    GENERAL:alert, no distress and comfortable SKIN: skin color, texture, turgor are normal, no rashes or significant lesions EYES: normal, Conjunctiva are pink and non-injected, sclera clear OROPHARYNX:no exudate, no erythema and lips, buccal mucosa, and tongue normal . Poor dental hygiene NECK: supple, thyroid normal size, non-tender, without nodularity LYMPH:  She has persistent lymphadenopathy in the left submandibular region, unchanged compared to prior visit  LUNGS: clear to auscultation and percussion with normal breathing effort HEART: regular rate & rhythm and no murmurs and no lower extremity edema ABDOMEN:abdomen soft, non-tender and normal bowel sounds Musculoskeletal:no cyanosis of digits and no clubbing  NEURO: alert & oriented x 3 with fluent speech, no  focal motor/sensory deficits  LABORATORY DATA:  I have reviewed the data as listed     Component Value Date/Time   NA 137 04/18/2015 1046   NA 141 03/06/2014 0759   K 4.2 04/18/2015 1046   K 4.0 03/06/2014 0759   CL 109 04/18/2015 1046   CO2 21* 04/18/2015 1046   CO2 20* 03/06/2014 0759   GLUCOSE 99 04/18/2015 1046   GLUCOSE 96 03/06/2014 0759   BUN 14 04/18/2015 1046   BUN 13.3 03/06/2014 0759   CREATININE 1.05* 04/18/2015 1046   CREATININE 1.01 04/23/2014 1253   CREATININE 1.1 03/06/2014 0759   CALCIUM 9.3 04/18/2015 1046   CALCIUM 8.6 03/06/2014 0759   PROT 9.4* 06/01/2014 1905   PROT 8.1 03/06/2014 0759   ALBUMIN 3.7 06/01/2014 1905   ALBUMIN 3.0* 03/06/2014 0759   AST 25 06/01/2014 1905   AST 21 03/06/2014 0759   ALT 23 06/01/2014 1905   ALT 27 03/06/2014 0759   ALKPHOS 140* 06/01/2014 1905   ALKPHOS 123 03/06/2014 0759   BILITOT 0.5 06/01/2014 1905   BILITOT 0.23 03/06/2014 0759   GFRNONAA >60 04/18/2015 1046   GFRNONAA 65 09/20/2013 1805   GFRAA >60 04/18/2015 1046   GFRAA 75 09/20/2013 1805    No results found for: SPEP, UPEP  Lab Results  Component Value Date   WBC 3.0* 07/16/2015   NEUTROABS 0.3* 07/16/2015   HGB 10.0* 07/16/2015   HCT 31.8* 07/16/2015   MCV 78.2* 07/16/2015   PLT 275 07/16/2015      Chemistry      Component Value Date/Time   NA 137 04/18/2015 1046   NA 141 03/06/2014 0759   K 4.2 04/18/2015 1046   K 4.0 03/06/2014 0759   CL 109 04/18/2015 1046   CO2 21* 04/18/2015 1046   CO2 20* 03/06/2014 0759   BUN 14 04/18/2015 1046   BUN 13.3 03/06/2014 0759   CREATININE 1.05* 04/18/2015 1046   CREATININE 1.01 04/23/2014 1253   CREATININE 1.1 03/06/2014 0759      Component Value Date/Time   CALCIUM 9.3 04/18/2015 1046   CALCIUM 8.6 03/06/2014 0759   ALKPHOS 140* 06/01/2014 1905   ALKPHOS 123 03/06/2014 0759   AST 25 06/01/2014 1905   AST 21 03/06/2014 0759   ALT 23 06/01/2014 1905   ALT 27 03/06/2014 0759   BILITOT 0.5  06/01/2014 1905   BILITOT 0.23 03/06/2014 0759      ASSESSMENT & PLAN:  Pancytopenia, acquired ([place]) She has persistent, severe neutropenia, unresponsive to prednisone and recent G-CSF. She had multiple bone marrow biopsy performed in the past and overall consensus from multiple subspecialties were this is related to cocaine abuse. The patient denies cocaine abuse for over 12 months. Previously, she had nonspecific lymphadenopathy and lymph node biopsy was non-diagnostic. Repeat CT scan show nonspecific lymphadenopathy that overall is improving. After review of her case at the most recent hematology tumor board, I recommend a trial of IVIG.  She has no response to IVIG. We discussed other options including rituximab, observation, or referral to tertiary center. Due to lack of symptoms, the patient elected for observation only and I think it is reasonable. I will see her back in 3 months with repeat blood work history and physical examination. I reinforced the importance of hand hygiene and close communication with me. Should she suspect signs of infection, she will call me immediately for further evaluation and management Today, I recommend high-dose vitamin B-12 supplement by mouth along with oral iron daily  Tobacco abuse The patient is attempting to quit smoking.  I reinforced the importance of smoking cessation.  Head and neck lymphadenopathy She was referred to ENT 2 years ago with negative lymph node biopsy. The lymph node in the neck is stable. This is likely reactive to the underlying hematology/chronic neutropenia. I will observe for now.   All questions were answered. The patient knows to call the clinic with any problems, questions or concerns. No barriers to learning was detected.  I spent 15 minutes counseling the patient face to face. The total time spent in the appointment was 20 minutes and more than 50% was on counseling.     Alvy Bimler, Koya Hunger, MD 4/25/20172:47  PM

## 2015-07-17 ENCOUNTER — Ambulatory Visit: Payer: Self-pay | Attending: Internal Medicine

## 2015-07-24 ENCOUNTER — Other Ambulatory Visit: Payer: Self-pay | Admitting: *Deleted

## 2015-07-24 ENCOUNTER — Encounter (HOSPITAL_COMMUNITY): Payer: No Typology Code available for payment source

## 2015-07-24 MED ORDER — FLUTICASONE-SALMETEROL 250-50 MCG/DOSE IN AEPB: 1.0000 | INHALATION_SPRAY | Freq: Two times a day (BID) | RESPIRATORY_TRACT | Status: DC

## 2015-07-24 NOTE — Telephone Encounter (Signed)
Reordered for the [place] program

## 2015-07-26 ENCOUNTER — Ambulatory Visit: Payer: Self-pay | Admitting: Family Medicine

## 2015-08-21 ENCOUNTER — Encounter: Payer: Self-pay | Admitting: Internal Medicine

## 2015-08-21 ENCOUNTER — Ambulatory Visit: Payer: Self-pay | Attending: Internal Medicine | Admitting: Internal Medicine

## 2015-08-21 VITALS — BP 128/90 | HR 84 | Temp 98.3°F | Wt 198.8 lb

## 2015-08-21 DIAGNOSIS — L739 Follicular disorder, unspecified: Secondary | ICD-10-CM | POA: Insufficient documentation

## 2015-08-21 DIAGNOSIS — R599 Enlarged lymph nodes, unspecified: Secondary | ICD-10-CM

## 2015-08-21 DIAGNOSIS — I10 Essential (primary) hypertension: Secondary | ICD-10-CM | POA: Insufficient documentation

## 2015-08-21 DIAGNOSIS — E785 Hyperlipidemia, unspecified: Secondary | ICD-10-CM | POA: Insufficient documentation

## 2015-08-21 DIAGNOSIS — F419 Anxiety disorder, unspecified: Secondary | ICD-10-CM | POA: Insufficient documentation

## 2015-08-21 DIAGNOSIS — F329 Major depressive disorder, single episode, unspecified: Secondary | ICD-10-CM | POA: Insufficient documentation

## 2015-08-21 DIAGNOSIS — K089 Disorder of teeth and supporting structures, unspecified: Secondary | ICD-10-CM

## 2015-08-21 DIAGNOSIS — D61818 Other pancytopenia: Secondary | ICD-10-CM | POA: Insufficient documentation

## 2015-08-21 DIAGNOSIS — J449 Chronic obstructive pulmonary disease, unspecified: Secondary | ICD-10-CM | POA: Insufficient documentation

## 2015-08-21 DIAGNOSIS — Z1211 Encounter for screening for malignant neoplasm of colon: Secondary | ICD-10-CM

## 2015-08-21 DIAGNOSIS — Z720 Tobacco use: Secondary | ICD-10-CM

## 2015-08-21 DIAGNOSIS — R591 Generalized enlarged lymph nodes: Secondary | ICD-10-CM

## 2015-08-21 DIAGNOSIS — Z131 Encounter for screening for diabetes mellitus: Secondary | ICD-10-CM

## 2015-08-21 DIAGNOSIS — I2510 Atherosclerotic heart disease of native coronary artery without angina pectoris: Secondary | ICD-10-CM | POA: Insufficient documentation

## 2015-08-21 DIAGNOSIS — Z79899 Other long term (current) drug therapy: Secondary | ICD-10-CM | POA: Insufficient documentation

## 2015-08-21 DIAGNOSIS — I5032 Chronic diastolic (congestive) heart failure: Secondary | ICD-10-CM | POA: Insufficient documentation

## 2015-08-21 DIAGNOSIS — Z1239 Encounter for other screening for malignant neoplasm of breast: Secondary | ICD-10-CM

## 2015-08-21 LAB — LIPID PANEL
CHOLESTEROL: 154 mg/dL (ref 125–200)
HDL: 43 mg/dL — AB (ref >=46)
LDL Cholesterol: 89 mg/dL (ref <130)
TRIGLYCERIDES: 109 mg/dL (ref <150)
Total CHOL/HDL Ratio: 3.6 Ratio (ref <=5.0)
VLDL: 22 mg/dL (ref <30)

## 2015-08-21 LAB — HEMOGLOBIN A1C
Hgb A1c MFr Bld: 5.4 % (ref <5.7)
Mean Plasma Glucose: 108 mg/dL

## 2015-08-21 MED ORDER — BACITRACIN-NEOMYCIN-POLYMYXIN 400-5-5000 EX OINT: 1.0000 "application " | TOPICAL_OINTMENT | Freq: Two times a day (BID) | CUTANEOUS | Status: DC

## 2015-08-21 MED ORDER — RANITIDINE HCL 150 MG PO TABS: 150.0000 mg | ORAL_TABLET | Freq: Two times a day (BID) | ORAL | Status: DC

## 2015-08-21 NOTE — Progress Notes (Signed)
Morgan Bean, is a 50 y.o. female  [id number]  [id number]  DOB - 07-31-1965  Chief Complaint  Patient presents with  . Cyst    x3 under left arm x 3dys        Subjective:   Morgan Bean is a 50 y.o. female here today for a follow up visit and c/o of left axilla cyst x 3 days.  Last seen in clinic 05/08/15 for copd w/ chronic bronchitis.  She is still smoking, 1/2 ppd, which is much less than her prior 2ppd.  On Welbutrin for anxiety, but she has not noticed it helping her much for tob cessation.  She c/o of left axilla cyst x 2 for last 3 days. 1 she aspirated and it looks better, the other one is getting larger. Both very tender. She use to shave her armpits, but stopped due to recurrent infections.  She has had prior surgery/ID of axilla boil in past as well.  She is taking all her meds as prescribed.  Has been on PPI for about 2 years, when stops get severe gerd.  She is willing to switch to zantac to see if helps.  Patient has No headache, f/c, No chest pain, No abdominal pain - No Nausea, No new weakness tingling or numbness, No Cough - SOB.  Due for pap and MM, never had colonoscopy.  Needs dental referral. She is working on [employer], says should get it w/in wk.  No problems updated.  ALLERGIES: Allergies  Allergen Reactions  . Ciprofloxacin Itching    PAST MEDICAL HISTORY: Past Medical History  Diagnosis Date  . Coronary artery disease     Lexiscan myoview (10/12) with significant ST depression upon Lexiscan injection but no ischemia or infarction on perfusion images. Left heart cath (10/12): 30% mid LAD, 30% ostial D1, 50% ostial D2.    [name] Hypertension   . Tricuspid regurgitation   . Iron deficiency     hx of  . Polysubstance abuse     Prior cocaine  . Tobacco abuse   . Leukocytoclastic vasculitis ([place])     Rash across lower body, occurred in 9/11, diagnosed by skin biopsy. ANA positive. Thought to be secondary to cocaine use.  .  Pulmonary HTN ([place])     Echo (9/11) with EF 65%, mild LVH, mild AI, mild MR, moderate to possibly severe TR with PA systolic pressure 52 mmHg. Echo (10/12): severe LV hypertrophy, EF 60-65%, mild MR, mild AI, moderate to severe tricuspid regurgitation, PA systolic pressure 38 mmHg.  Echo 4/14: moderate LVH, EF 65%, normal wall motion, diastolic dysfunction, mild AI, mild MR  . Asthma   . Shortness of breath     a. PFTs 5/14:  FEV1/FVC 99% predicted; FVC 60% predicted; DLCO mildly reduced, mild restriction, air trapping.;  b.  seen by pulmo (Dr. [name]) 5/14: mainly upper airway symptoms - ACE d/c'd (sx's better)  . Chronic diastolic CHF (congestive heart failure) ([place])   . Constipation   . Lymphadenopathy 03/21/2013  . Leukopenia 03/21/2013  . Unspecified deficiency anemia 03/29/2013  . Anginal pain ([place])     none in past year  . Anxiety   . Mental disorder   . Depression   . Stroke ([place]) 2010ish  . Arthritis   . Pancytopenia ([place])   . Hyperlipemia   . Syphilis 04/25/2013  . Candidiasis 06/23/2013  . Anemia of other chronic disease 11/13/2013  . Cough 11/13/2013  . Lymphadenopathy 01/30/2014  . Headache 02/22/2014  .  Hx of cardiovascular stress test     Lexiscan Myoview (2/16): Breast attenuation artifact, no ischemia, EF 64%; low risk  . Pancytopenia, acquired ([place]) 07/16/2015    MEDICATIONS AT HOME: Prior to Admission medications   Medication Sig Start Date End Date Taking? Authorizing Provider  albuterol (PROVENTIL HFA;VENTOLIN HFA) 108 (90 Base) MCG/ACT inhaler Inhale 2 puffs into the lungs every 6 (six) hours as needed for wheezing or shortness of breath. 07/10/15  Yes Tresa Garter, MD  amLODipine (NORVASC) 10 MG tablet Take 1 tablet (10 mg total) by mouth daily. 08/01/14  Yes Burtis Junes, NP  atorvastatin (LIPITOR) 40 MG tablet TAKE 1 TABLET BY MOUTH IN THE EVENING 02/18/15  Yes [name], MD  buPROPion [facility] SR) 150 MG 12 hr tablet Take 150 mg by mouth 2 (two)  times daily.   Yes Historical Provider, MD  citalopram (CELEXA) 40 MG tablet Take 1 tablet (40 mg total) by mouth daily. 08/25/13  Yes Tresa Garter, MD  cloNIDine (CATAPRES) 0.2 MG tablet Take 1 tablet (0.2 mg total) by mouth 2 (two) times daily. 08/01/14  Yes Burtis Junes, NP  clopidogrel (PLAVIX) 75 MG tablet Take 1 tablet (75 mg total) by mouth daily. 05/14/14  Yes Cameron Sprang, MD  ferrous sulfate 325 (65 FE) MG tablet Take 650 mg by mouth daily with breakfast.   Yes Historical Provider, MD  fluticasone (FLONASE) 50 MCG/ACT nasal spray Place 2 sprays into both nostrils daily. 05/08/15  Yes Elsie Stain, MD  Fluticasone-Salmeterol (ADVAIR) 250-50 MCG/DOSE AEPB Inhale 1 puff into the lungs 2 (two) times daily. 07/24/15  Yes Elsie Stain, MD  furosemide (LASIX) 20 MG tablet Take 2 tablets (40 mg total) by mouth 2 (two) times daily. 04/18/15  Yes [name], MD  hydrOXYzine (ATARAX/VISTARIL) 25 MG tablet Take 25 mg by mouth at bedtime as needed (sleep).   Yes Historical Provider, MD  loratadine (CLARITIN) 10 MG tablet Take 1 tablet (10 mg total) by mouth daily. 09/20/13  Yes Lance Bosch, NP  Multiple Vitamin (MULTIVITAMIN WITH MINERALS) TABS Take 1 tablet by mouth daily. 11/18/11  Yes Milana Huntsman Readling, MD  polyethylene glycol powder (GLYCOLAX/MIRALAX) powder Take 17 g by mouth daily as needed (constipation).    Yes Historical Provider, MD  potassium chloride (K-DUR) 10 MEQ tablet TAKE 2 TABLETS BY MOUTH TWICE A DAY 06/20/15  Yes [name], MD  topiramate (TOPAMAX) 25 MG tablet Take 2 tablets at night 03/13/15  Yes Cameron Sprang, MD  traZODone (DESYREL) 100 MG tablet Take 1 tablet (100 mg total) by mouth at bedtime. 08/25/13  Yes Tresa Garter, MD  neomycin-bacitracin-polymyxin (NEOSPORIN) ointment Apply 1 application topically every 12 (twelve) hours. apply to left axilla boils. 08/21/15   Maren Reamer, MD  ranitidine (ZANTAC) 150 MG tablet Take 1 tablet (150 mg total)  by mouth 2 (two) times daily. 08/21/15   Maren Reamer, MD     Objective:   Filed Vitals:   08/21/15 1112  BP: 128/90  Pulse: 84  Temp: 98.3 F (36.8 C)  TempSrc: Oral  Weight: 198 lb 12.8 oz (90.175 kg)    Exam General appearance : Awake, alert, not in any distress. Speech Clear. Not toxic looking, pleasant HEENT: Atraumatic and Normocephalic, pupils equally reactive to light.  bilat TMs clear, dental carries noted. Neck: supple, no JVD. No cervical lymphadenopathy.  bilat axilla: Left axilla; 2 areas of induration, 1 about 1  cm which has opened w/ surround erythema, no flutulence, nothing to aspiration, other smaller area <1cm, indurated, mild erythema, but no flutulence; mild ttp both sigths.  No edema noted; right axilla clear Chest:  intermittent dry cough, o/w Good air entry bilaterally, no added sounds/no wheezes. CVS: S1 S2 regular, no murmurs/gallups or rubs. Abdomen: Bowel sounds active, Non tender and not distended , soft, obese, Extremities: B/L Lower Ext shows no edema, both legs are warm to touch Neurology: Awake alert, and oriented X 3, CN II-XII grossly intact, Non focal Skin:No Rash  Data Review Lab Results  Component Value Date   HGBA1C 5.2 10/04/2013   HGBA1C 5.5 04/06/2012   HGBA1C * 12/02/2009    5.7 (NOTE)                                                                       According to the ADA Clinical Practice Recommendations for 2011, when HbA1c is used as a screening test:   >=6.5%   Diagnostic of Diabetes Mellitus           (if abnormal result  is confirmed)  5.7-6.4%   Increased risk of developing Diabetes Mellitus  References:Diagnosis and Classification of Diabetes Mellitus,Diabetes [id number] 1):S62-S69 and Standards of Medical Care in         Diabetes - 2011,Diabetes Care,2011,34  (Suppl 1):S11-S61.    Depression screen [facility] 2/9 08/21/2015 05/08/2015 04/23/2014 11/13/2013 09/20/2013  Decreased Interest 1 0 0 0 3  Down, Depressed, Hopeless 1  0 0 0 2  PHQ - 2 Score 2 0 0 0 5  Altered sleeping 3 - - - 3  Tired, decreased energy 3 - - - 2  Change in appetite 3 - - - 2  Feeling bad or failure about yourself  2 - - - 2  Trouble concentrating 3 - - - 1  Moving slowly or fidgety/restless 1 - - - 1  Suicidal thoughts 1 - - - 0  PHQ-9 Score 18 - - - 16  Difficult doing work/chores Somewhat difficult - - - -   Echo 04/18/15 LV EF: 60% - 65%  ------------------------------------------------------------------- History: PMH: Acquired from the patient&'s chart. Exertional dyspnea and cough. Coronary artery disease. Stroke. Primary pulmonary hypertension. Risk factors: Current tobacco use. Hypertension. Dyslipidemia.  ------------------------------------------------------------------- Study Conclusions  - Left ventricle: The cavity size was normal. Wall thickness was  increased in a pattern of mild LVH. Systolic function was normal.  The estimated ejection fraction was in the range of 60% to 65%.  Wall motion was normal; there were no regional wall motion  abnormalities. - Aortic valve: There was mild regurgitation. - Mitral valve: There was mild regurgitation. - Tricuspid valve: There was moderate regurgitation. - Pulmonary arteries: Systolic pressure was mildly to moderately  increased. PA peak pressure: 40 mm Hg (S).  04/23/15 Right heart cath Conclusion    1. Near-normal right and left heart filling pressures, no pulmonary hypertension. No evidence for hemodynamically significant TR. She can continue the same Lasix dose with no change. I suspect that COPD may be the major cause of her dyspnea at this point.  2. Cardiac output lower than I would expect.     Technique and Indications    Procedure: Right  Heart Cath  Indication: History of tricuspid regurgitation, worsening exertional dyspnea.   Procedural Details: The right brachial area was prepped, draped, and anesthetized with 1% lidocaine.  There was a pre-existing peripheral IV in the right brachial area. This was replaced with a 71F venous sheath. A Swan-Ganz catheter was used for the right heart catheterization. Standard protocol was followed for recording of right heart pressures and sampling of oxygen saturations. Fick cardiac output was calculated. There were no immediate procedural complications. The patient was transferred to the post catheterization recovery area for further monitoring.Estimated blood loss <50 mL.     Right Heart Pressures Procedural Findings: Hemodynamics (mmHg) RA mean 6 (v-waves not prominent) RV 31/10 PA 30/10, mean 20 PCWP mean 13  Oxygen saturations: PA 57% AO 98%  Cardiac Output (Fick) 4.2  Cardiac Index (Fick) 2.02    - 05/11/14 myocardia perfusion - normal reported, no ischemia  03/2013 ln bx Diagnosis 1. Nasopharynx, biopsy - BENIGN NASOPHARYNGEAL TISSUE, SEE COMMENT. 2. Lymph node, biopsy, Right sub mandibular - BENIGN LYMPH NODAL TISSUE WITH PLASMACYTOSIS AND PERINODAL FIBROSIS, SEE COMMENT. Assessment & Plan   1. Folliculitis, left axilla - Recd warm heat compresses qid/often as possible.  If white head forms, may aspirate w/ clean hands using q-tips.   - neosporin ointment tid - do not recd shaving  2. Tobacco abuse w/ copd and Chronic bronchitis rcd complete tob cessation,  3. Copd w/ chronic bronchitis In pulm clinic w/ Dr Joya Gaskins now, las seen 2/17 On alb prn, advair  4. Essential hypertension w/ nonobstructive CAD - Well controlled on clonidine 0.2mg  po bid, norvasc 10, - low salt diet discussed.  5. Hld - Lipid panel - currently on atorvastatin 40  6. Chronic diastolic heart failure ([name]) - no edema noted le, no jvd, taking lasix 40bid currenlty.  7. Pancytopenia, acquired,  - Being followed by Heme, Dr Alvy Bimler,; felt due to cocaine abuse, which pt states she has abstained from at least 1 year now. - hx of unresponsiveness to prednisone and G-CSF,  currently only on B12 supplements orally and iron daily.  8. Chronic head neck LAD - bx by ENT 03/2013 w/ neg bx. - follow, also being followed by heme/onc.  9. Depression/anxiety - High scoring, but denies si/hi/avh, likely due to comorbidities. -has been on welbutrin for long time, does appear to be helping much. - She also takes Trazodone which does help, prescibed by Psychiatry she sees  10. 08/06/13 ?tia On plavix by neuro for 2nd prevention, last f/u w/ Neuro 12/16  11. Poor dentition /dental carries - Ambulatory referral to Dentistry  12. Hx of headaches/migranes Takes topamax, it is helping, but says her Neurologist may be taking her off of it.  19. Health maintenance - Due for pap - Screening for diabetes mellitus (DM)  chk- Hemoglobin A1C - Breast cancer screening - MM DIGITAL SCREENING BILATERAL; Future - Colon cancer screening - - Ambulatory referral to Gastroenterology   Patient have been counseled extensively about nutrition and exercise  Return in about 4 weeks (around 09/18/2015), or if symptoms worsen or fail to improve, for folliculitis/ pap.  The patient was given clear instructions to go to ER or return to medical center if symptoms don't improve, worsen or new problems develop. The patient verbalized understanding. The patient was told to call to get lab results if they haven't heard anything in the next week.    Maren Reamer, MD, MBA/MHA [facility] and [place], [place]  [phone]   08/21/2015, 11:57 AM

## 2015-08-21 NOTE — Patient Instructions (Signed)
It was nice meeting you today.  Warm heat compression to Left armpit as often as able to help boils/folliculitis pop. Use q-tips if need to pop them; wash hands thoroughly before and after.  Low-Sodium Eating Plan Sodium raises blood pressure and causes water to be held in the body. Getting less sodium from food will help lower your blood pressure, reduce any swelling, and protect your heart, liver, and kidneys. We get sodium by adding salt (sodium chloride) to food. Most of our sodium comes from canned, boxed, and frozen foods. Restaurant foods, fast foods, and pizza are also very high in sodium. Even if you take medicine to lower your blood pressure or to reduce fluid in your body, getting less sodium from your food is important. WHAT IS MY PLAN? Most people should limit their sodium intake to 2,300 mg a day. Your health care provider recommends that you limit your sodium intake to __________ a day.  WHAT DO I NEED TO KNOW ABOUT THIS EATING PLAN? For the low-sodium eating plan, you will follow these general guidelines:  Choose foods with a % Daily Value for sodium of less than 5% (as listed on the food label).   Use salt-free seasonings or herbs instead of table salt or sea salt.   Check with your health care provider or pharmacist before using salt substitutes.   Eat fresh foods.  Eat more vegetables and fruits.  Limit canned vegetables. If you do use them, rinse them well to decrease the sodium.   Limit cheese to 1 oz (28 g) per day.   Eat lower-sodium products, often labeled as "lower sodium" or "no salt added."  Avoid foods that contain monosodium glutamate (MSG). MSG is sometimes added to [place] food and some canned foods.  Check food labels (Nutrition Facts labels) on foods to learn how much sodium is in one serving.  Eat more home-cooked food and less restaurant, buffet, and fast food.  When eating at a restaurant, ask that your food be prepared with less salt, or  no salt if possible.  HOW DO I READ FOOD LABELS FOR SODIUM INFORMATION? The Nutrition Facts label lists the amount of sodium in one serving of the food. If you eat more than one serving, you must multiply the listed amount of sodium by the number of servings. Food labels may also identify foods as:  Sodium free--Less than 5 mg in a serving.  Very low sodium--35 mg or less in a serving.  Low sodium--140 mg or less in a serving.  Light in sodium--50% less sodium in a serving. For example, if a food that usually has 300 mg of sodium is changed to become light in sodium, it will have 150 mg of sodium.  Reduced sodium--25% less sodium in a serving. For example, if a food that usually has 400 mg of sodium is changed to reduced sodium, it will have 300 mg of sodium. WHAT FOODS CAN I EAT? Grains Low-sodium cereals, including oats, puffed wheat and rice, and shredded wheat cereals. Low-sodium crackers. Unsalted rice and pasta. Lower-sodium bread.  Vegetables Frozen or fresh vegetables. Low-sodium or reduced-sodium canned vegetables. Low-sodium or reduced-sodium tomato sauce and paste. Low-sodium or reduced-sodium tomato and vegetable juices.  Fruits Fresh, frozen, and canned fruit. Fruit juice.  Meat and Other Protein Products Low-sodium canned tuna and salmon. Fresh or frozen meat, poultry, seafood, and fish. Lamb. Unsalted nuts. Dried beans, peas, and lentils without added salt. Unsalted canned beans. Homemade soups without salt. Eggs.  Dairy  Milk. Soy milk. Ricotta cheese. Low-sodium or reduced-sodium cheeses. Yogurt.  Condiments Fresh and dried herbs and spices. Salt-free seasonings. Onion and garlic powders. Low-sodium varieties of mustard and ketchup. Fresh or refrigerated horseradish. Lemon juice.  Fats and Oils Reduced-sodium salad dressings. Unsalted butter.  Other Unsalted popcorn and pretzels.  The items listed above may not be a complete list of recommended foods or  beverages. Contact your dietitian for more options. WHAT FOODS ARE NOT RECOMMENDED? Grains Instant hot cereals. Bread stuffing, pancake, and biscuit mixes. Croutons. Seasoned rice or pasta mixes. Noodle soup cups. Boxed or frozen macaroni and cheese. Self-rising flour. Regular salted crackers. Vegetables Regular canned vegetables. Regular canned tomato sauce and paste. Regular tomato and vegetable juices. Frozen vegetables in sauces. Salted [place] fries. Olives. Angie Fava. Relishes. Sauerkraut. Salsa. Meat and Other Protein Products Salted, canned, smoked, spiced, or pickled meats, seafood, or fish. Bacon, ham, sausage, hot dogs, corned beef, chipped beef, and packaged luncheon meats. Salt pork. Jerky. Pickled herring. Anchovies, regular canned tuna, and sardines. Salted nuts. Dairy Processed cheese and cheese spreads. Cheese curds. Blue cheese and cottage cheese. Buttermilk.  Condiments Onion and garlic salt, seasoned salt, table salt, and sea salt. Canned and packaged gravies. Worcestershire sauce. Tartar sauce. Barbecue sauce. Teriyaki sauce. Soy sauce, including reduced sodium. Steak sauce. Fish sauce. Oyster sauce. Cocktail sauce. Horseradish that you find on the shelf. Regular ketchup and mustard. Meat flavorings and tenderizers. Bouillon cubes. Hot sauce. Tabasco sauce. Marinades. Taco seasonings. Relishes. Fats and Oils Regular salad dressings. Salted butter. Margarine. Ghee. Bacon fat.  Other Potato and tortilla chips. Corn chips and puffs. Salted popcorn and pretzels. Canned or dried soups. Pizza. Frozen entrees and pot pies.  The items listed above may not be a complete list of foods and beverages to avoid. Contact your dietitian for more information.   This information is not intended to replace advice given to you by your health care provider. Make sure you discuss any questions you have with your health care provider.   Document Released: 08/29/2001 Document Revised:  03/30/2014 Document Reviewed: 01/11/2013 Elsevier Interactive Patient Education 2016 Elsevier Inc.  - Folliculitis Folliculitis is redness, soreness, and swelling (inflammation) of the hair follicles. This condition can occur anywhere on the body. People with weakened immune systems, diabetes, or obesity have a greater risk of getting folliculitis. CAUSES  Bacterial infection. This is the most common cause.  Fungal infection.  Viral infection.  Contact with certain chemicals, especially oils and tars. Long-term folliculitis can result from bacteria that live in the nostrils. The bacteria may trigger multiple outbreaks of folliculitis over time. SYMPTOMS Folliculitis most commonly occurs on the scalp, thighs, legs, back, buttocks, and areas where hair is shaved frequently. An early sign of folliculitis is a small, white or yellow, pus-filled, itchy lesion (pustule). These lesions appear on a red, inflamed follicle. They are usually less than 0.2 inches (5 mm) wide. When there is an infection of the follicle that goes deeper, it becomes a boil or furuncle. A group of closely packed boils creates a larger lesion (carbuncle). Carbuncles tend to occur in hairy, sweaty areas of the body. DIAGNOSIS  Your caregiver can usually tell what is wrong by doing a physical exam. A sample may be taken from one of the lesions and tested in a lab. This can help determine what is causing your folliculitis. TREATMENT  Treatment may include:  Applying warm compresses to the affected areas.  Taking antibiotic medicines orally or applying them to the skin.  Draining the lesions if they contain a large amount of pus or fluid.  Laser hair removal for cases of long-lasting folliculitis. This helps to prevent regrowth of the hair. HOME CARE INSTRUCTIONS  Apply warm compresses to the affected areas as directed by your caregiver.  If antibiotics are prescribed, take them as directed. Finish them even if you  start to feel better.  You may take over-the-counter medicines to relieve itching.  Do not shave irritated skin.  Follow up with your caregiver as directed. SEEK IMMEDIATE MEDICAL CARE IF:   You have increasing redness, swelling, or pain in the affected area.  You have a fever. MAKE SURE YOU:  Understand these instructions.  Will watch your condition.  Will get help right away if you are not doing well or get worse.   This information is not intended to replace advice given to you by your health care provider. Make sure you discuss any questions you have with your health care provider.   Document Released: 05/18/2001 Document Revised: 03/30/2014 Document Reviewed: 06/09/2011 Elsevier Interactive Patient Education [employer].

## 2015-09-23 ENCOUNTER — Inpatient Hospital Stay: Admission: RE | Admit: 2015-09-23 | Payer: Self-pay | Source: Ambulatory Visit

## 2015-09-30 ENCOUNTER — Other Ambulatory Visit: Payer: Self-pay | Admitting: Nurse Practitioner

## 2015-10-07 ENCOUNTER — Ambulatory Visit: Payer: No Typology Code available for payment source | Admitting: Neurology

## 2015-10-15 ENCOUNTER — Ambulatory Visit: Payer: Self-pay | Admitting: Hematology and Oncology

## 2015-10-15 ENCOUNTER — Other Ambulatory Visit: Payer: Self-pay

## 2015-11-30 DIAGNOSIS — Z720 Tobacco use: Secondary | ICD-10-CM | POA: Insufficient documentation

## 2015-12-06 ENCOUNTER — Other Ambulatory Visit: Payer: Self-pay | Admitting: *Deleted

## 2015-12-06 MED ORDER — ALBUTEROL SULFATE HFA 108 (90 BASE) MCG/ACT IN AERS
2.0000 | INHALATION_SPRAY | Freq: Four times a day (QID) | RESPIRATORY_TRACT | 3 refills | Status: DC | PRN
Start: 2015-12-06 — End: 2018-10-14

## 2015-12-06 NOTE — Telephone Encounter (Signed)
PRINTED FOR PASS PROGRAM 

## 2016-03-17 ENCOUNTER — Emergency Department (HOSPITAL_COMMUNITY): Payer: Self-pay

## 2016-03-17 ENCOUNTER — Encounter (HOSPITAL_COMMUNITY): Payer: Self-pay | Admitting: Emergency Medicine

## 2016-03-17 ENCOUNTER — Emergency Department (HOSPITAL_COMMUNITY)
Admission: EM | Admit: 2016-03-17 | Discharge: 2016-03-18 | Disposition: A | Discharge status: Home / Self Care | Payer: Self-pay | Attending: Emergency Medicine | Admitting: Emergency Medicine

## 2016-03-17 DIAGNOSIS — I110 Hypertensive heart disease with heart failure: Secondary | ICD-10-CM | POA: Insufficient documentation

## 2016-03-17 DIAGNOSIS — I5032 Chronic diastolic (congestive) heart failure: Secondary | ICD-10-CM | POA: Insufficient documentation

## 2016-03-17 DIAGNOSIS — R070 Pain in throat: Secondary | ICD-10-CM | POA: Insufficient documentation

## 2016-03-17 DIAGNOSIS — Z8673 Personal history of transient ischemic attack (TIA), and cerebral infarction without residual deficits: Secondary | ICD-10-CM | POA: Insufficient documentation

## 2016-03-17 DIAGNOSIS — I2510 Atherosclerotic heart disease of native coronary artery without angina pectoris: Secondary | ICD-10-CM | POA: Insufficient documentation

## 2016-03-17 DIAGNOSIS — Z7982 Long term (current) use of aspirin: Secondary | ICD-10-CM | POA: Insufficient documentation

## 2016-03-17 DIAGNOSIS — F17210 Nicotine dependence, cigarettes, uncomplicated: Secondary | ICD-10-CM | POA: Insufficient documentation

## 2016-03-17 DIAGNOSIS — J449 Chronic obstructive pulmonary disease, unspecified: Secondary | ICD-10-CM | POA: Insufficient documentation

## 2016-03-17 HISTORY — DX: Intraductal carcinoma in situ of unspecified breast: D05.10

## 2016-03-17 MED ORDER — SODIUM CHLORIDE 0.9 % IV BOLUS (SEPSIS)
1000.0000 mL | Freq: Once | INTRAVENOUS | Status: AC
  Administered 2016-03-18: 1000 mL via INTRAVENOUS

## 2016-03-17 NOTE — ED Triage Notes (Signed)
Pt comes in tonight with c/o swollen lymph nodes in her neck  Pt unable to speak normally  Pt states she has not been well since Saturday  Pt states she has been unable to eat for the past 4 days  Pt states when she tries to swallow it makes her gag  Pt states she had some ice cream and could feel the cold in the sides of her throat but not in the center  Pt able to speak in a whisper but not louder  Pt has visible swelling to her neck   Pt's tongue is white and dry

## 2016-03-17 NOTE — ED Provider Notes (Signed)
[facility] DEPT Provider Note   CSN: [id number] Arrival date & time: 03/17/16  2225  By signing my name below, I, Dora Sims, attest that this documentation has been prepared under the direction and in the presence of Shawn Joy, PA-C. Electronically Signed: Dora Sims, Scribe. 03/17/2016. 11:38 PM.  History   Chief Complaint Chief Complaint  Patient presents with  . Adenopathy    The history is provided by the patient and a relative. No language interpreter was used.     HPI Comments: Morgan Bean is a 50 y.o. female with PMHx significant for lymphadenopathy who presents to the Emergency Department complaining of gradual onset, constant, worsening, cervical lymphadenopathy beginning 4 days ago. She reports some hoarseness and states she can only speak in a whisper due to the swelling.   She states she has had difficulty swallowing food and fluids over the last 2-3 days; the last time she ate or drank was yesterday. She states it feels like there is something in her throat. She reports 10/10 pain in her lymph nodes when she is able to swallow but no pain without swallowing. She notes an occasional productive cough with white/yellow/green sputum since onset. Pt reports she measured a fever of 102.2 yesterday. She notes she has been admitted to the hospital for the same in the past (March 2016) and had phlegm blocking her esophagus. She denies SOB, leg swelling, vomiting, or any other associated symptoms.  Past Medical History:  Diagnosis Date  . Anemia of other chronic disease 11/13/2013  . Anginal pain ([place])    none in past year  . Anxiety   . Asthma   . Candidiasis 06/23/2013  . Chronic diastolic CHF (congestive heart failure) ([place])   . Constipation   . Coronary artery disease    Lexiscan myoview (10/12) with significant ST depression upon Lexiscan injection but no ischemia or infarction on perfusion images. Left heart cath (10/12): 30% mid LAD, 30% ostial D1, 50% ostial  D2.    [name] Cough 11/13/2013  . DCIS (ductal carcinoma in situ) of breast   . Depression   . Headache 02/22/2014  . Hx of cardiovascular stress test    Lexiscan Myoview (2/16): Breast attenuation artifact, no ischemia, EF 64%; low risk  . Hyperlipemia   . Hypertension   . Iron deficiency    hx of  . Leukocytoclastic vasculitis ([place])    Rash across lower body, occurred in 9/11, diagnosed by skin biopsy. ANA positive. Thought to be secondary to cocaine use.  . Leukopenia 03/21/2013  . Lymphadenopathy 03/21/2013  . Lymphadenopathy 01/30/2014  . Mental disorder   . Pancytopenia ([place])   . Pancytopenia, acquired ([place]) 07/16/2015  . Polysubstance abuse    Prior cocaine  . Pulmonary HTN    Echo (9/11) with EF 65%, mild LVH, mild AI, mild MR, moderate to possibly severe TR with PA systolic pressure 52 mmHg. Echo (10/12): severe LV hypertrophy, EF 60-65%, mild MR, mild AI, moderate to severe tricuspid regurgitation, PA systolic pressure 38 mmHg.  Echo 4/14: moderate LVH, EF 65%, normal wall motion, diastolic dysfunction, mild AI, mild MR  . Shortness of breath    a. PFTs 5/14:  FEV1/FVC 99% predicted; FVC 60% predicted; DLCO mildly reduced, mild restriction, air trapping.;  b.  seen by pulmo (Dr. [name]) 5/14: mainly upper airway symptoms - ACE d/c'd (sx's better)  . Stroke ([place]) 2010ish  . Syphilis 04/25/2013  . Tobacco abuse   . Tricuspid regurgitation   . Unspecified deficiency anemia  03/29/2013    Patient Active Problem List   Diagnosis Date Noted  . Pancytopenia, acquired ([place]) 07/16/2015  . Head and neck lymphadenopathy 07/16/2015  . 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 Absolute anemia 02/25/2015  . Preventive measure 12/27/2014  . 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 History of stroke 12/26/2014  . 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 Headache 02/22/2014  . Dental caries 01/09/2014  . Cough 11/13/2013  . Tobacco abuse 11/13/2013  . Swelling of gums 11/13/2013  . Fatigue  11/10/2013  . Intracranial atherosclerosis 11/10/2013  . Headache(784.0) 10/04/2013  . Hyperreflexia 10/04/2013  . TIA (transient ischemic attack) 10/04/2013  . 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 Fibroid uterus 09/14/2012  . Perimenopause 09/14/2012  . Chronic cough 08/12/2012  . History of CVA (cerebrovascular accident) 08/09/2012  . Chest pain, mid sternal 04/07/2012  . Pulmonary hypertension 04/06/2012  . Polysubstance abuse 04/06/2012  . History of cocaine abuse 11/16/2011  . Major depressive disorder, recurrent episode with psychotic features. 11/15/2011    Class: Acute  . Chronic diastolic heart failure ([place]) 02/24/2011  . CAD (coronary artery disease) 02/24/2011  . Hyperlipidemia 01/06/2011  . Hypertension 12/15/2010  . Chest pain 12/15/2010  . Exertional dyspnea 12/15/2010  . Leukocytoclastic vasculitis ([place]) 03/24/2007    Past Surgical History:  Procedure Laterality Date  . ANKLE SURGERY     right  . BONE MARROW ASPIRATION    . CARDIAC CATHETERIZATION    . CARDIAC CATHETERIZATION N/A 04/23/2015   Procedure: Right Heart Cath;  Surgeon: [name], MD;  Location: [facility];  Service: Cardiovascular;  Laterality: N/A;  . I&D EXTREMITY  08/09/2011   Procedure: IRRIGATION AND DEBRIDEMENT EXTREMITY;  Surgeon: [name], MD;  Location: [facility];  Service: General;  Laterality: Left;  i & D Left axilla abscess  . LYMPH NODE BIOPSY N/A 04/05/2013   Procedure: EXCISIONAL BIOPSY RIGHT SUBMANDIBULAR NODE, NASAL ENDOSCOPIC WITH BIOPSY NASAL PHARYNX;  Surgeon: [name], MD;  Location: [facility];  Service: ENT;  Laterality: N/A;    OB History    Gravida Para Term Preterm AB Living   4 2 2   2 2    SAB TAB Ectopic Multiple Live Births     2             Home Medications    Prior to Admission medications   Medication Sig Start Date End Date Taking? Authorizing Provider  albuterol (PROVENTIL HFA;VENTOLIN HFA) 108 (90 Base) MCG/ACT inhaler  Inhale 2 puffs into the lungs every 6 (six) hours as needed for wheezing or shortness of breath. 12/06/15  Yes Tresa Garter, MD  amLODipine (NORVASC) 10 MG tablet Take 1 tablet (10 mg total) by mouth daily. 08/01/14  Yes Burtis Junes, NP  aspirin EC 325 MG tablet Take 325 mg by mouth daily.   Yes Historical Provider, MD  atorvastatin (LIPITOR) 40 MG tablet TAKE 1 TABLET BY MOUTH IN THE EVENING 02/18/15  Yes [name], MD  buPROPion [facility] SR) 150 MG 12 hr tablet Take 150 mg by mouth 2 (two) times daily.   Yes Historical Provider, MD  citalopram (CELEXA) 40 MG tablet Take 1 tablet (40 mg total) by mouth daily. 08/25/13  Yes Tresa Garter, MD  cyanocobalamin 500 MCG tablet Take 500 mcg by mouth daily.   Yes Historical Provider, MD  DULERA 100-5 MCG/ACT AERO Inhale 2 puffs into the lungs 2 (two) times daily. 02/10/16  Yes Historical  Provider, MD  ferrous sulfate 325 (65 FE) MG tablet Take 650 mg by mouth daily with breakfast.   Yes Historical Provider, MD  furosemide (LASIX) 20 MG tablet Take 2 tablets (40 mg total) by mouth 2 (two) times daily. Patient taking differently: Take 20-40 mg by mouth 2 (two) times daily. Take 2 tablets every morning and 1 tablet every evening 04/18/15  Yes [name], MD  hydrOXYzine (VISTARIL) 25 MG capsule Take 1 capsule by mouth 4 (four) times daily as needed for anxiety. 12/06/15  Yes Historical Provider, MD  K-TAB 20 MEQ TBCR Take 1 tablet by mouth 2 (two) times daily with a meal. 02/10/16  Yes Historical Provider, MD  loratadine (CLARITIN) 10 MG tablet Take 1 tablet (10 mg total) by mouth daily. 09/20/13  Yes Lance Bosch, NP  Multiple Vitamin (MULTI-VITAMINS) TABS Take 1 tablet by mouth daily.   Yes Historical Provider, MD  polyethylene glycol powder (GLYCOLAX/MIRALAX) powder Take 17 g by mouth daily as needed (constipation).    Yes Historical Provider, MD  ranitidine (ZANTAC) 150 MG tablet Take 1 tablet (150 mg total) by mouth 2 (two) times  daily. 08/21/15  Yes Maren Reamer, MD  topiramate (TOPAMAX) 25 MG tablet Take 2 tablets at night 03/13/15  Yes Cameron Sprang, MD  traZODone (DESYREL) 150 MG tablet Take 150 mg by mouth at bedtime. 01/07/16  Yes Historical Provider, MD  cloNIDine (CATAPRES) 0.2 MG tablet TAKE 1 TABLET BY MOUTH 2 TIMES DAILY Patient not taking: Reported on 03/17/2016 10/21/15   [name], MD  fluticasone [facility]) 50 MCG/ACT nasal spray Place 2 sprays into both nostrils daily. Patient not taking: Reported on 03/17/2016 05/08/15   Elsie Stain, MD  Fluticasone-Salmeterol (ADVAIR) 250-50 MCG/DOSE AEPB Inhale 1 puff into the lungs 2 (two) times daily. Patient not taking: Reported on 03/17/2016 07/24/15   Elsie Stain, MD  neomycin-bacitracin-polymyxin (NEOSPORIN) ointment Apply 1 application topically every 12 (twelve) hours. apply to left axilla boils. Patient not taking: Reported on 03/17/2016 08/21/15   Maren Reamer, MD  predniSONE (STERAPRED UNI-PAK 21 TAB) 10 MG (21) TBPK tablet Take 6 tabs by mouth daily  for 2 days, then 5 tabs for 2 days, then 4 tabs for 2 days, then 3 tabs for 2 days, 2 tabs for 2 days, then 1 tab by mouth daily for 2 days 03/18/16   Lorayne Bender, PA-C    Family History Family History  Problem Relation Age of Onset  . Coronary artery disease Mother   . Diabetes Mother   . Diabetes Father   . Hypertension Father   . Coronary artery disease Father   . Heart attack Father   . Breast cancer Maternal Grandmother   . Diabetes Maternal Grandmother   . Heart attack Maternal Grandmother   . Stroke Maternal Grandmother     Social History Social History  Substance Use Topics  . Smoking status: Current Every Day Smoker    Packs/day: 0.00    Years: 30.00    Types: Cigarettes  . Smokeless tobacco: Never Used  . Alcohol use No     Comment: pint of wine 2-3 times a week--ocassional - quit 2012     Allergies   Ciprofloxacin   Review of Systems Review of Systems    Constitutional: Positive for fever.  HENT: Positive for sore throat, trouble swallowing and voice change (hoarse). Negative for drooling.   Respiratory: Positive for cough (productive). Negative for shortness of breath.  Cardiovascular: Negative for leg swelling.  Gastrointestinal: Negative for nausea and vomiting.  Hematological: Positive for adenopathy (cervical).  All other systems reviewed and are negative.    Physical Exam Updated Vital Signs BP 119/79 (BP Location: Left Arm)   Pulse 104   Temp 99 F (37.2 C) (Oral)   Resp 20   Ht 5\' 5"  (1.651 m)   Wt 196 lb 11.2 oz (89.2 kg)   LMP 04/24/2013 Comment: states periods come and go  SpO2 99%   BMI 32.73 kg/m   Physical Exam  Constitutional: She appears well-developed and well-nourished. No distress.  HENT:  Head: Normocephalic and atraumatic.  Mouth/Throat: Oropharynx is clear and moist.  Quiet speaking voice with some hoarseness. Handles oral secretions without difficulty or drooling. Swallow reflex is intact.  Eyes: Conjunctivae are normal.  Neck: Normal range of motion. Neck supple. No tracheal deviation present.  No swelling noted to the soft tissues of the neck.  Cardiovascular: Normal rate, regular rhythm, normal heart sounds and intact distal pulses.   Pulmonary/Chest: Effort normal and breath sounds normal. No stridor. No respiratory distress.  No stridor. No increased work of breathing. Patient speaks in full sentences without difficulty.  Abdominal: Soft. There is no tenderness. There is no guarding.  Musculoskeletal: She exhibits no edema.  Lymphadenopathy:    She has cervical adenopathy (anterior).  Neurological: She is alert.  Skin: Skin is warm and dry. She is not diaphoretic.  Psychiatric: She has a normal mood and affect. Her behavior is normal.  Nursing note and vitals reviewed.    ED Treatments / Results  Labs (all labs ordered are listed, but only abnormal results are displayed) Labs Reviewed   BASIC METABOLIC PANEL - Abnormal; Notable for the following:       Result Value   CO2 16 (*)    Glucose, Bld 116 (*)    BUN 28 (*)    Creatinine, Ser 1.74 (*)    GFR calc non Af Amer 33 (*)    GFR calc Af Amer 38 (*)    All other components within normal limits  CBC WITH DIFFERENTIAL/PLATELET - Abnormal; Notable for the following:    Hemoglobin 11.1 (*)    HCT 33.5 (*)    Neutro Abs 0.4 (*)    Monocytes Absolute 1.5 (*)    All other components within normal limits  PATHOLOGIST SMEAR REVIEW  I-STAT CG4 LACTIC ACID, ED    EKG  EKG Interpretation None       Radiology Dg Chest 2 View  Result Date: 03/18/2016 CLINICAL DATA:  Sore throat, fever and cough EXAM: CHEST  2 VIEW COMPARISON:  Chest radiograph 04/12/2014 FINDINGS: The heart size and mediastinal contours are within normal limits. Both lungs are clear. The visualized skeletal structures are unremarkable. IMPRESSION: No active cardiopulmonary disease. Electronically Signed   By: Ulyses Jarred M.D.   On: 03/18/2016 00:28   Ct Soft Tissue Neck W Contrast  Result Date: 03/18/2016 CLINICAL DATA:  Difficulty swallowing.  Hoarseness. EXAM: CT NECK WITH CONTRAST TECHNIQUE: Multidetector CT imaging of the neck was performed using the standard protocol following the bolus administration of intravenous contrast. CONTRAST:  81mL ISOVUE-300 IOPAMIDOL (ISOVUE-300) INJECTION 61% COMPARISON:  CT neck 06/01/2014, 06/04/2013 FINDINGS: Pharynx and larynx: The nasopharynx is clear. The palatine tonsils are edematous and enlarged without focal fluid collection. The lingual tonsils are mildly enlarged. There is mild thickening of the epiglottis, but this appears unchanged compared to 06/01/2014. There is no retropharyngeal adenopathy, effusion  or abscess. Diffuse thickening of the aryepiglottic folds, right greater than left is again seen, but slightly decreased relative to the prior study. Salivary glands: The parotid and submandibular glands are  normal. No sialolithiasis or salivary ductal dilatation. Thyroid: Normal. Lymph nodes: Left level 1B lymph node measures 8 mm. Right level 2A lymph nodes measure up to 11 mm, unchanged. Left level 2A lymph nodes measure also up to 11 mm, unchanged. Left level 5A lymph node measures 1.4 cm, previously 1.2 cm. No necrotic or suppurative lymph nodes. Vascular: Major cervical vessels are patent. Limited intracranial: Normal Visualized orbits: Normal Mastoids and visualized paranasal sinuses: Clear Skeleton: There is no bony spinal canal stenosis. No lytic or blastic lesions. Upper chest: Negative Other: None IMPRESSION: 1. Persistent enlargement of the tonsils of Waldeyer's ring and unchanged bilateral cervical lymphadenopathy. 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 2. Unchanged enlarged aryepiglottic folds, right greater than left. 3. No peritonsillar or retropharyngeal abscess. Electronically Signed   By: Ulyses Jarred M.D.   On: 03/18/2016 02:24    Procedures Procedures (including critical care time)  DIAGNOSTIC STUDIES: Oxygen Saturation is 99% on RA, normal by my interpretation.    COORDINATION OF CARE: 11:51 PM Discussed treatment plan with pt at bedside and pt agreed to plan.  Medications Ordered in ED Medications  iopamidol (ISOVUE-300) 61 % injection (not administered)  sodium chloride 0.9 % bolus 1,000 mL (0 mLs Intravenous Stopped 03/18/16 0140)  sodium chloride 0.9 % bolus 1,000 mL (0 mLs Intravenous Stopped 03/18/16 0315)  iopamidol (ISOVUE-300) 61 % injection 75 mL (60 mLs Intravenous Contrast Given 03/18/16 0138)  dexamethasone (DECADRON) injection 10 mg (10 mg Intravenous Given 03/18/16 0313)     Initial Impression / Assessment and Plan / ED Course  I have reviewed the triage vital signs and the nursing notes.  Pertinent labs & imaging results that were available during my care of the patient were reviewed by me and  considered in my medical decision making (see chart for details).  Clinical Course     Patient presents with throat pain and swelling sensation for the last 4 days. Patient is nontoxic appearing, afebrile, not tachycardic, not tachypneic, not hypotensive, maintains SPO2 of 99% on room air, and is in no apparent distress. Patient has no signs of sepsis or angioedema. Patient voices improvement following administration of IV fluids and Decadron. Patient is able to sip water and eat ice chips throughout her ED course. Following the Decadron, patient was able to take small drinks of water. ENT follow-up recommended. Strict return precautions discussed. Patient voiced understanding of these instructions and is comfortable with discharge.  Findings and plan of care discussed with Veryl Speak, MD.   Vitals:   03/17/16 2251 03/18/16 0135 03/18/16 0200 03/18/16 0414  BP: 119/79 102/67 101/75 110/81  Pulse: 104 99 93 98  Resp: 20 20  18   Temp: 99 F (37.2 C)     TempSrc: Oral     SpO2: 99% 99% 99% 99%  Weight:      Height:         Final Clinical Impressions(s) / ED Diagnoses   Final diagnoses:  Throat pain    New Prescriptions New Prescriptions   PREDNISONE (STERAPRED UNI-PAK 21 TAB) 10 MG (21) TBPK TABLET    Take 6 tabs by mouth daily  for 2 days, then 5 tabs for 2 days, then 4 tabs for 2 days, then 3 tabs for 2 days,  2 tabs for 2 days, then 1 tab by mouth daily for 2 days   I personally performed the services described in this documentation, which was scribed in my presence. The recorded information has been reviewed and is accurate.   Lorayne Bender, PA-C 03/18/16 0530    Veryl Speak, MD 03/19/16 [phone]

## 2016-03-18 ENCOUNTER — Emergency Department (HOSPITAL_COMMUNITY): Payer: Self-pay

## 2016-03-18 ENCOUNTER — Encounter (HOSPITAL_COMMUNITY): Payer: Self-pay

## 2016-03-18 LAB — CBC WITH DIFFERENTIAL/PLATELET
BASOS ABS: 0 10*3/uL (ref 0.0–0.1)
Basophils Relative: 1 %
EOS ABS: 0 10*3/uL (ref 0.0–0.7)
Eosinophils Relative: 1 %
HEMATOCRIT: 33.5 % — AB (ref 36.0–46.0)
HEMOGLOBIN: 11.1 g/dL — AB (ref 12.0–15.0)
LYMPHS PCT: 56 %
Lymphs Abs: 2.5 10*3/uL (ref 0.7–4.0)
MCH: 26.4 pg (ref 26.0–34.0)
MCHC: 33.1 g/dL (ref 30.0–36.0)
MCV: 79.6 fL (ref 78.0–100.0)
MONOS PCT: 34 %
Monocytes Absolute: 1.5 10*3/uL — ABNORMAL HIGH (ref 0.1–1.0)
NEUTROS ABS: 0.4 10*3/uL — AB (ref 1.7–7.7)
NEUTROS PCT: 8 %
Platelets: 205 10*3/uL (ref 150–400)
RBC: 4.21 MIL/uL (ref 3.87–5.11)
RDW: 15 % (ref 11.5–15.5)
WBC: 4.4 10*3/uL (ref 4.0–10.5)

## 2016-03-18 LAB — BASIC METABOLIC PANEL
ANION GAP: 12 (ref 5–15)
BUN: 28 mg/dL — ABNORMAL HIGH (ref 6–20)
CALCIUM: 9 mg/dL (ref 8.9–10.3)
CO2: 16 mmol/L — ABNORMAL LOW (ref 22–32)
Chloride: 108 mmol/L (ref 101–111)
Creatinine, Ser: 1.74 mg/dL — ABNORMAL HIGH (ref 0.44–1.00)
GFR, EST AFRICAN AMERICAN: 38 mL/min — AB (ref >60)
GFR, EST NON AFRICAN AMERICAN: 33 mL/min — AB (ref >60)
Glucose, Bld: 116 mg/dL — ABNORMAL HIGH (ref 65–99)
POTASSIUM: 4 mmol/L (ref 3.5–5.1)
SODIUM: 136 mmol/L (ref 135–145)

## 2016-03-18 LAB — I-STAT CG4 LACTIC ACID, ED: LACTIC ACID, VENOUS: 1.16 mmol/L (ref 0.5–1.9)

## 2016-03-18 LAB — PATHOLOGIST SMEAR REVIEW

## 2016-03-18 MED ORDER — PREDNISONE 10 MG (21) PO TBPK: ORAL_TABLET | ORAL | 0 refills | Status: DC

## 2016-03-18 MED ORDER — IOPAMIDOL (ISOVUE-300) INJECTION 61%
75.0000 mL | Freq: Once | INTRAVENOUS | Status: AC | PRN
  Administered 2016-03-18: 60 mL via INTRAVENOUS

## 2016-03-18 MED ORDER — IOPAMIDOL (ISOVUE-300) INJECTION 61%
INTRAVENOUS | Status: AC
  Filled 2016-03-18: qty 75

## 2016-03-18 MED ORDER — SODIUM CHLORIDE 0.9 % IV BOLUS (SEPSIS)
1000.0000 mL | Freq: Once | INTRAVENOUS | Status: AC
  Administered 2016-03-18: 1000 mL via INTRAVENOUS

## 2016-03-18 MED ORDER — DEXAMETHASONE SODIUM PHOSPHATE 10 MG/ML IJ SOLN
10.0000 mg | Freq: Once | INTRAMUSCULAR | Status: AC
  Administered 2016-03-18: 10 mg via INTRAVENOUS
  Filled 2016-03-18: qty 1

## 2016-03-18 NOTE — Discharge Instructions (Signed)
You have been seen today for throat pain and swelling. There was some swelling noted on the CT scan, but no blockage. Please follow up with the Ear, Nose, and Throat (ENT) specialist on this matter. Return to the ED should symptoms worsen.

## 2016-03-18 NOTE — ED Notes (Signed)
Patient given water and when she attempts to drink she begins coughing and gags stating she feels like the water is getting stuck and will not go down. Asked her does it just hurt and she said both it hurts and it feels as though it couldn't go down her throat. She has tolerated a cup of ice chips without complication.

## 2016-03-18 NOTE — ED Notes (Signed)
Patient transported to CT 

## 2016-03-18 NOTE — ED Notes (Signed)
Patient denies pain and is resting comfortably.  

## 2016-04-25 DIAGNOSIS — I4892 Unspecified atrial flutter: Secondary | ICD-10-CM | POA: Insufficient documentation

## 2017-06-15 ENCOUNTER — Emergency Department (HOSPITAL_COMMUNITY)
Admission: EM | Admit: 2017-06-15 | Discharge: 2017-06-15 | Disposition: A | Discharge status: Home / Self Care | Payer: Self-pay | Attending: Emergency Medicine | Admitting: Emergency Medicine

## 2017-06-15 ENCOUNTER — Encounter (HOSPITAL_COMMUNITY): Payer: Self-pay

## 2017-06-15 ENCOUNTER — Other Ambulatory Visit: Payer: Self-pay

## 2017-06-15 ENCOUNTER — Emergency Department (HOSPITAL_COMMUNITY): Payer: Self-pay

## 2017-06-15 DIAGNOSIS — Z5321 Procedure and treatment not carried out due to patient leaving prior to being seen by health care provider: Secondary | ICD-10-CM | POA: Insufficient documentation

## 2017-06-15 DIAGNOSIS — R079 Chest pain, unspecified: Secondary | ICD-10-CM | POA: Insufficient documentation

## 2017-06-15 LAB — BASIC METABOLIC PANEL
Anion gap: 9 (ref 5–15)
BUN: 15 mg/dL (ref 6–20)
CALCIUM: 8.7 mg/dL — AB (ref 8.9–10.3)
CO2: 20 mmol/L — AB (ref 22–32)
CREATININE: 1.12 mg/dL — AB (ref 0.44–1.00)
Chloride: 107 mmol/L (ref 101–111)
GFR calc Af Amer: >60 mL/min (ref >60)
GFR calc non Af Amer: 55 mL/min — ABNORMAL LOW (ref >60)
GLUCOSE: 105 mg/dL — AB (ref 65–99)
Potassium: 4.1 mmol/L (ref 3.5–5.1)
Sodium: 136 mmol/L (ref 135–145)

## 2017-06-15 LAB — CBC
HCT: 34.2 % — ABNORMAL LOW (ref 36.0–46.0)
Hemoglobin: 11 g/dL — ABNORMAL LOW (ref 12.0–15.0)
MCH: 26.9 pg (ref 26.0–34.0)
MCHC: 32.2 g/dL (ref 30.0–36.0)
MCV: 83.6 fL (ref 78.0–100.0)
PLATELETS: 181 10*3/uL (ref 150–400)
RBC: 4.09 MIL/uL (ref 3.87–5.11)
RDW: 15.3 % (ref 11.5–15.5)
WBC: 3.2 10*3/uL — ABNORMAL LOW (ref 4.0–10.5)

## 2017-06-15 LAB — DIFFERENTIAL
Band Neutrophils: 0 %
Basophils Absolute: 0 10*3/uL (ref 0.0–0.1)
Basophils Relative: 0 %
Blasts: 0 %
Eosinophils Absolute: 0 10*3/uL (ref 0.0–0.7)
Eosinophils Relative: 1 %
LYMPHS ABS: 2.7 10*3/uL (ref 0.7–4.0)
Lymphocytes Relative: 84 %
MYELOCYTES: 0 %
Metamyelocytes Relative: 0 %
Monocytes Absolute: 0.5 10*3/uL (ref 0.1–1.0)
Monocytes Relative: 15 %
NEUTROS ABS: 0 10*3/uL — AB (ref 1.7–7.7)
NEUTROS PCT: 0 %
Promyelocytes Absolute: 0 %
nRBC: 0 /100 WBC

## 2017-06-15 LAB — I-STAT TROPONIN, ED: TROPONIN I, POC: 0 ng/mL (ref 0.00–0.08)

## 2017-06-15 LAB — PROTIME-INR
INR: 1.04
PROTHROMBIN TIME: 13.5 s (ref 11.4–15.2)

## 2017-06-15 LAB — I-STAT BETA HCG BLOOD, ED (MC, WL, AP ONLY): I-stat hCG, quantitative: <5 m[IU]/mL (ref <5)

## 2017-06-15 LAB — APTT: aPTT: 35 seconds (ref 24–36)

## 2017-06-15 MED ORDER — ACETAMINOPHEN 325 MG PO TABS
650.0000 mg | ORAL_TABLET | Freq: Once | ORAL | Status: AC | PRN
  Administered 2017-06-15: 650 mg via ORAL
  Filled 2017-06-15: qty 2

## 2017-06-15 NOTE — ED Triage Notes (Signed)
Patient was at home and started having chest pain and feeling warm at 7am.  Central chest pain with left arm pan and weakness.  Fever noted in triage.  Patient A&Ox4.

## 2017-06-15 NOTE — ED Triage Notes (Signed)
Patient also stated she has been having a constant headache since 7am. History of stroke.  Also having blurred vision all day.  VAN negative.

## 2017-06-15 NOTE — ED Notes (Addendum)
Pt stating she will come back tomorrow that she no longer wants to wait. Informed pt of delay and encouraged to stay. Pt ambulatory to exit with steady gait and family

## 2017-06-16 LAB — PATHOLOGIST SMEAR REVIEW

## 2017-07-02 ENCOUNTER — Inpatient Hospital Stay: Payer: Self-pay

## 2017-10-19 ENCOUNTER — Emergency Department (HOSPITAL_COMMUNITY): Payer: Self-pay

## 2017-10-19 ENCOUNTER — Emergency Department (HOSPITAL_COMMUNITY)
Admission: EM | Admit: 2017-10-19 | Discharge: 2017-10-19 | Disposition: A | Discharge status: Home / Self Care | Payer: Self-pay | Attending: Emergency Medicine | Admitting: Emergency Medicine

## 2017-10-19 ENCOUNTER — Encounter (HOSPITAL_COMMUNITY): Payer: Self-pay

## 2017-10-19 DIAGNOSIS — I2510 Atherosclerotic heart disease of native coronary artery without angina pectoris: Secondary | ICD-10-CM | POA: Insufficient documentation

## 2017-10-19 DIAGNOSIS — Z79899 Other long term (current) drug therapy: Secondary | ICD-10-CM | POA: Insufficient documentation

## 2017-10-19 DIAGNOSIS — J45909 Unspecified asthma, uncomplicated: Secondary | ICD-10-CM | POA: Insufficient documentation

## 2017-10-19 DIAGNOSIS — F17210 Nicotine dependence, cigarettes, uncomplicated: Secondary | ICD-10-CM | POA: Insufficient documentation

## 2017-10-19 DIAGNOSIS — K122 Cellulitis and abscess of mouth: Secondary | ICD-10-CM | POA: Insufficient documentation

## 2017-10-19 DIAGNOSIS — L0390 Cellulitis, unspecified: Secondary | ICD-10-CM

## 2017-10-19 DIAGNOSIS — Z7982 Long term (current) use of aspirin: Secondary | ICD-10-CM | POA: Insufficient documentation

## 2017-10-19 DIAGNOSIS — I110 Hypertensive heart disease with heart failure: Secondary | ICD-10-CM | POA: Insufficient documentation

## 2017-10-19 DIAGNOSIS — I5032 Chronic diastolic (congestive) heart failure: Secondary | ICD-10-CM | POA: Insufficient documentation

## 2017-10-19 LAB — CBC WITH DIFFERENTIAL/PLATELET
Basophils Absolute: 0 10*3/uL (ref 0.0–0.1)
Basophils Relative: 2 %
EOS PCT: 3 %
Eosinophils Absolute: 0.1 10*3/uL (ref 0.0–0.7)
HEMATOCRIT: 34.9 % — AB (ref 36.0–46.0)
HEMOGLOBIN: 11.1 g/dL — AB (ref 12.0–15.0)
LYMPHS PCT: 73 %
Lymphs Abs: 1.8 10*3/uL (ref 0.7–4.0)
MCH: 24.7 pg — AB (ref 26.0–34.0)
MCHC: 31.8 g/dL (ref 30.0–36.0)
MCV: 77.7 fL — ABNORMAL LOW (ref 78.0–100.0)
MONOS PCT: 22 %
Monocytes Absolute: 0.5 10*3/uL (ref 0.1–1.0)
NEUTROS ABS: 0 10*3/uL — AB (ref 1.7–7.7)
NEUTROS PCT: 0 %
Platelets: 284 10*3/uL (ref 150–400)
RBC: 4.49 MIL/uL (ref 3.87–5.11)
RDW: 16 % — ABNORMAL HIGH (ref 11.5–15.5)
WBC: 2.4 10*3/uL — ABNORMAL LOW (ref 4.0–10.5)

## 2017-10-19 LAB — BASIC METABOLIC PANEL
Anion gap: 9 (ref 5–15)
BUN: 13 mg/dL (ref 6–20)
CHLORIDE: 104 mmol/L (ref 98–111)
CO2: 25 mmol/L (ref 22–32)
Calcium: 9.3 mg/dL (ref 8.9–10.3)
Creatinine, Ser: 1.15 mg/dL — ABNORMAL HIGH (ref 0.44–1.00)
GFR calc Af Amer: >60 mL/min (ref >60)
GFR calc non Af Amer: 53 mL/min — ABNORMAL LOW (ref >60)
Glucose, Bld: 99 mg/dL (ref 70–99)
POTASSIUM: 3.9 mmol/L (ref 3.5–5.1)
SODIUM: 138 mmol/L (ref 135–145)

## 2017-10-19 LAB — I-STAT BETA HCG BLOOD, ED (MC, WL, AP ONLY)

## 2017-10-19 MED ORDER — CLINDAMYCIN HCL 150 MG PO CAPS: 450.0000 mg | ORAL_CAPSULE | Freq: Three times a day (TID) | ORAL | 0 refills | Status: DC

## 2017-10-19 MED ORDER — IOHEXOL 300 MG/ML  SOLN
75.0000 mL | Freq: Once | INTRAMUSCULAR | Status: AC | PRN
  Administered 2017-10-19: 75 mL via INTRAVENOUS

## 2017-10-19 NOTE — Discharge Instructions (Signed)
You are seen in the emergency department today for a painful area to your left lower lip.  The CT scan shows findings consistent with cellulitis, this is an infection, we are treating the infection with clindamycin which is an antibiotic. Please take all of your antibiotics until finished. You may develop abdominal discomfort or diarrhea from the antibiotic.  You may help offset this with probiotics which you can buy at the store (ask your pharmacist if unable to find) or get probiotics in the form of eating yogurt. Do not eat or take the probiotics until 2 hours after your antibiotic. If you are unable to tolerate these side effects follow-up with your primary care provider or return to the emergency department.   If you begin to experience any blistering, rashes, swelling, or difficulty breathing seek medical care for evaluation of potentially more serious side effects.   Please be aware that this medication may interact with other medications you are taking, please be sure to discuss your medication list with your pharmacist. If you are taking birth control the antibiotic will deactivate your birth control for 2 weeks. If on coumadin the antibiotic will effect your coumadin level.   Please take Tylenol per over-the-counter dosing instructions for any continued discomfort.  We would like you to follow-up closely with your ears nose and throat, Dr. Erik Obey is a specialist who works for ENT, please call his office to make an appointment within the next 1 to 3 days for reevaluation.  Would also like you to follow-up with your primary care provider as your white blood cell count appears lower than it has been previously, today it is 2.4, previously was 3.2.  Also he should have your blood pressure rechecked as it was elevated in the emergency department today.  If you do not have a primary care provider we have given you information for our [place] clinic.  Return to the ER anytime for new or worsening  symptoms including but not limited to inability to open your mouth, trouble moving your neck, change in your voice, fever, trouble breathing, worsening pain, worsening swelling, or any other concerns.

## 2017-10-19 NOTE — ED Triage Notes (Signed)
Pt presents with c/o mouth sore. Pt reports she has had this one on her mouth for a couple of weeks and she cannot get it to heal. Pt is very tearful in triage.

## 2017-10-19 NOTE — ED Provider Notes (Signed)
[facility] DEPT Provider Note   CSN: [id number] Arrival date & time: 10/19/17  1049     History   Chief Complaint Chief Complaint  Patient presents with  . Mouth Lesions    HPI Morgan Bean is a 52 y.o. female with history of tobacco abuse, pancytopenia, chronic diastolic HF, CAD, DCIS of breast, hypertension, hyperlipidemia, polysubstance abuse, prior stroke, and leukocytoclastic vasculitis who presents to the emergency department with progressively worsening oral lesion for the past 2 to 3 weeks.  Patient states that she has a white area on the inside of her left lower lip which has been progressively increasing in size and becoming progressively more uncomfortable.  She is that the lesion itself is not painful however the surrounding skin is.  She rates her discomfort a 10 out of 10 in severity, it is worse when she is attempting to eat, no specific alleviating factors, no interventions prior to arrival.  She has some associated nausea without vomiting as well as swollen lymph nodes.  Denies fever, abdominal pain, trouble breathing, or intraoral drainage.  She denies history of similar.  HPI  Past Medical History:  Diagnosis Date  . Anemia of other chronic disease 11/13/2013  . Anginal pain ([place])    none in past year  . Anxiety   . Asthma   . Candidiasis 06/23/2013  . Chronic diastolic CHF (congestive heart failure) ([place])   . Constipation   . Coronary artery disease    Lexiscan myoview (10/12) with significant ST depression upon Lexiscan injection but no ischemia or infarction on perfusion images. Left heart cath (10/12): 30% mid LAD, 30% ostial D1, 50% ostial D2.    [name] Cough 11/13/2013  . DCIS (ductal carcinoma in situ) of breast   . Depression   . Headache 02/22/2014  . Hx of cardiovascular stress test    Lexiscan Myoview (2/16): Breast attenuation artifact, no ischemia, EF 64%; low risk  . Hyperlipemia   . Hypertension   . Iron deficiency     hx of  . Leukocytoclastic vasculitis ([place])    Rash across lower body, occurred in 9/11, diagnosed by skin biopsy. ANA positive. Thought to be secondary to cocaine use.  . Leukopenia 03/21/2013  . Lymphadenopathy 03/21/2013  . Lymphadenopathy 01/30/2014  . Mental disorder   . Pancytopenia ([place])   . Pancytopenia, acquired ([place]) 07/16/2015  . Polysubstance abuse ([place])    Prior cocaine  . Pulmonary HTN ([place])    Echo (9/11) with EF 65%, mild LVH, mild AI, mild MR, moderate to possibly severe TR with PA systolic pressure 52 mmHg. Echo (10/12): severe LV hypertrophy, EF 60-65%, mild MR, mild AI, moderate to severe tricuspid regurgitation, PA systolic pressure 38 mmHg.  Echo 4/14: moderate LVH, EF 65%, normal wall motion, diastolic dysfunction, mild AI, mild MR  . Shortness of breath    a. PFTs 5/14:  FEV1/FVC 99% predicted; FVC 60% predicted; DLCO mildly reduced, mild restriction, air trapping.;  b.  seen by pulmo (Dr. [name]) 5/14: mainly upper airway symptoms - ACE d/c'd (sx's better)  . Stroke ([place]) 2010ish  . Syphilis 04/25/2013  . Tobacco abuse   . Tricuspid regurgitation   . Unspecified deficiency anemia 03/29/2013    Patient Active Problem List   Diagnosis Date Noted  . Pancytopenia, acquired ([place]) 07/16/2015  . Head and neck lymphadenopathy 07/16/2015  . 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 Absolute anemia 02/25/2015  . Preventive measure 12/27/2014  . Primary stabbing  headache 12/26/2014  . History of stroke 12/26/2014  . 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 Headache 02/22/2014  . Dental caries 01/09/2014  . Cough 11/13/2013  . Tobacco abuse 11/13/2013  . Swelling of gums 11/13/2013  . Fatigue 11/10/2013  . Intracranial atherosclerosis 11/10/2013  . Headache(784.0) 10/04/2013  . Hyperreflexia 10/04/2013  . TIA (transient ischemic attack) 10/04/2013  . 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 Fibroid uterus 09/14/2012  . Perimenopause 09/14/2012  .  Chronic cough 08/12/2012  . History of CVA (cerebrovascular accident) 08/09/2012  . Chest pain, mid sternal 04/07/2012  . Pulmonary hypertension ([place]) 04/06/2012  . Polysubstance abuse ([place]) 04/06/2012  . History of cocaine abuse 11/16/2011  . Major depressive disorder, recurrent episode with psychotic features. 11/15/2011    Class: Acute  . Chronic diastolic heart failure ([place]) 02/24/2011  . CAD (coronary artery disease) 02/24/2011  . Hyperlipidemia 01/06/2011  . Hypertension 12/15/2010  . Chest pain 12/15/2010  . Exertional dyspnea 12/15/2010  . Leukocytoclastic vasculitis ([place]) 03/24/2007    Past Surgical History:  Procedure Laterality Date  . ANKLE SURGERY     right  . BONE MARROW ASPIRATION    . CARDIAC CATHETERIZATION    . CARDIAC CATHETERIZATION N/A 04/23/2015   Procedure: Right Heart Cath;  Surgeon: [name], MD;  Location: [facility];  Service: Cardiovascular;  Laterality: N/A;  . I&D EXTREMITY  08/09/2011   Procedure: IRRIGATION AND DEBRIDEMENT EXTREMITY;  Surgeon: [name], MD;  Location: [facility];  Service: General;  Laterality: Left;  i & D Left axilla abscess  . LYMPH NODE BIOPSY N/A 04/05/2013   Procedure: EXCISIONAL BIOPSY RIGHT SUBMANDIBULAR NODE, NASAL ENDOSCOPIC WITH BIOPSY NASAL PHARYNX;  Surgeon: [name], MD;  Location: [facility];  Service: ENT;  Laterality: N/A;     OB History    Gravida  4   Para  2   Term  2   Preterm      AB  2   Living  2     SAB      TAB  2   Ectopic      Multiple      Live Births               Home Medications    Prior to Admission medications   Medication Sig Start Date End Date Taking? Authorizing Provider  albuterol (PROVENTIL HFA;VENTOLIN HFA) 108 (90 Base) MCG/ACT inhaler Inhale 2 puffs into the lungs every 6 (six) hours as needed for wheezing or shortness of breath. 12/06/15   Tresa Garter, MD  amLODipine (NORVASC) 10 MG tablet Take 1 tablet (10 mg total) by mouth daily.  08/01/14   Burtis Junes, NP  aspirin EC 325 MG tablet Take 325 mg by mouth daily.    [provider]  atorvastatin (LIPITOR) 40 MG tablet TAKE 1 TABLET BY MOUTH IN THE EVENING 02/18/15   [name], MD  buPROPion [facility] SR) 150 MG 12 hr tablet Take 150 mg by mouth 2 (two) times daily.    [provider]  citalopram (CELEXA) 40 MG tablet Take 1 tablet (40 mg total) by mouth daily. 08/25/13   Tresa Garter, MD  cloNIDine (CATAPRES) 0.2 MG tablet TAKE 1 TABLET BY MOUTH 2 TIMES DAILY Patient not taking: Reported on 03/17/2016 10/21/15   [name], MD  cyanocobalamin 500 MCG tablet Take 500 mcg by mouth daily.    [provider]  DULERA 100-5 MCG/ACT AERO  Inhale 2 puffs into the lungs 2 (two) times daily. 02/10/16   [provider]  ferrous sulfate 325 (65 FE) MG tablet Take 650 mg by mouth daily with breakfast.    [provider]  fluticasone (FLONASE) 50 MCG/ACT nasal spray Place 2 sprays into both nostrils daily. Patient not taking: Reported on 03/17/2016 05/08/15   Elsie Stain, MD  Fluticasone-Salmeterol (ADVAIR) 250-50 MCG/DOSE AEPB Inhale 1 puff into the lungs 2 (two) times daily. Patient not taking: Reported on 03/17/2016 07/24/15   Elsie Stain, MD  furosemide (LASIX) 20 MG tablet Take 2 tablets (40 mg total) by mouth 2 (two) times daily. Patient taking differently: Take 20-40 mg by mouth 2 (two) times daily. Take 2 tablets every morning and 1 tablet every evening 04/18/15   [name], MD  hydrOXYzine (VISTARIL) 25 MG capsule Take 1 capsule by mouth 4 (four) times daily as needed for anxiety. 12/06/15   [provider]  K-TAB 20 MEQ TBCR Take 1 tablet by mouth 2 (two) times daily with a meal. 02/10/16   [provider]  loratadine (CLARITIN) 10 MG tablet Take 1 tablet (10 mg total) by mouth daily. 09/20/13   Lance Bosch, NP  Multiple Vitamin (MULTI-VITAMINS) TABS Take 1 tablet by mouth daily.     [provider]  neomycin-bacitracin-polymyxin (NEOSPORIN) ointment Apply 1 application topically every 12 (twelve) hours. apply to left axilla boils. Patient not taking: Reported on 03/17/2016 08/21/15   Lottie Mussel T, MD  polyethylene glycol powder (GLYCOLAX/MIRALAX) powder Take 17 g by mouth daily as needed (constipation).     [provider]  predniSONE (STERAPRED UNI-PAK 21 TAB) 10 MG (21) TBPK tablet Take 6 tabs by mouth daily  for 2 days, then 5 tabs for 2 days, then 4 tabs for 2 days, then 3 tabs for 2 days, 2 tabs for 2 days, then 1 tab by mouth daily for 2 days 03/18/16   Joy, Shawn C, PA-C  ranitidine (ZANTAC) 150 MG tablet Take 1 tablet (150 mg total) by mouth 2 (two) times daily. 08/21/15   Maren Reamer, MD  topiramate (TOPAMAX) 25 MG tablet Take 2 tablets at night 03/13/15   Cameron Sprang, MD  traZODone (DESYREL) 150 MG tablet Take 150 mg by mouth at bedtime. 01/07/16   [provider]    Family History Family History  Problem Relation Age of Onset  . Coronary artery disease Mother   . Diabetes Mother   . Diabetes Father   . Hypertension Father   . Coronary artery disease Father   . Heart attack Father   . Breast cancer Maternal Grandmother   . Diabetes Maternal Grandmother   . Heart attack Maternal Grandmother   . Stroke Maternal Grandmother     Social History Social History   Tobacco Use  . Smoking status: Current Every Day Smoker    Packs/day: 0.00    Years: 30.00    Pack years: 0.00    Types: Cigarettes  . Smokeless tobacco: Never Used  Substance Use Topics  . Alcohol use: No    Alcohol/week: 0.0 oz    Comment: pint of wine 2-3 times a week--ocassional - quit 2012  . Drug use: No    Frequency: 1.0 times per week    Types: "Crack" cocaine    Comment: none since 02/04/14     Allergies   Ciprofloxacin   Review of Systems Review of Systems  Constitutional: Negative for fever.  HENT: Negative for sore throat and  trouble swallowing.        Positive for intra-oral lesion.   Respiratory: Negative for shortness of breath.   All other systems reviewed and are negative.   Physical Exam Updated Vital Signs BP (!) 177/122 (BP Location: Right Arm) Comment: pt unable to hold still for BP  Pulse (!) 102   Temp 99.8 F (37.7 C) (Oral)   Resp (!) 22   Ht 5\' 6"  (1.676 m)   Wt 81.6 kg (180 lb)   LMP 05/10/2013   SpO2 100%   BMI 29.05 kg/m   Physical Exam  Constitutional: She appears well-developed and well-nourished.  Non-toxic appearance. No distress.  HENT:  Head: Normocephalic and atraumatic.  Right Ear: Tympanic membrane normal.  Left Ear: Tympanic membrane normal.  Nose: Nose normal.  Mouth/Throat: Uvula is midline and oropharynx is clear and moist.  There is an approximally 1.5 cm thickened area of white discoloration to the left lower lip mucosa.  Lesion is not tender to palpation, however surrounding mucosa and external overlying skin is tender to palpation.  Surrounding tissue appears erythematous and is somewhat indurated.  No palpable fluctuance.  No active drainage.  Patient is tolerating her own secretions without difficulty.  No trismus.  No drooling.  No hot potato voice.  Submandibular compartment is fairly soft.  She has poor dentition throughout.  Eyes: Pupils are equal, round, and reactive to light. Conjunctivae are normal. Right eye exhibits no discharge. Left eye exhibits no discharge.  Neck: Normal range of motion. No edema and no erythema present.  Cardiovascular: Normal rate and regular rhythm.  No murmur heard. Pulmonary/Chest: Breath sounds normal. No respiratory distress. She has no wheezes. She has no rales.  Abdominal: Soft. She exhibits no distension. There is no tenderness.  Lymphadenopathy:       Head (right side): Submandibular adenopathy present.       Head (left side): Submandibular adenopathy present.    She has cervical adenopathy.  Neurological: She is alert.    Clear speech.   Skin: Skin is warm and dry. No rash noted.  Psychiatric: She has a normal mood and affect. Her behavior is normal.  Nursing note and vitals reviewed.        ED Treatments / Results  Labs Results for orders placed or performed during the hospital encounter of 10/19/17  CBC with Differential  Result Value Ref Range   WBC 2.4 (L) 4.0 - 10.5 K/uL   RBC 4.49 3.87 - 5.11 MIL/uL   Hemoglobin 11.1 (L) 12.0 - 15.0 g/dL   HCT 34.9 (L) 36.0 - 46.0 %   MCV 77.7 (L) 78.0 - 100.0 fL   MCH 24.7 (L) 26.0 - 34.0 pg   MCHC 31.8 30.0 - 36.0 g/dL   RDW 16.0 (H) 11.5 - 15.5 %   Platelets 284 150 - 400 K/uL   Neutrophils Relative % 0 %   Lymphocytes Relative 73 %   Monocytes Relative 22 %   Eosinophils Relative 3 %   Basophils Relative 2 %   Neutro Abs 0.0 (L) 1.7 - 7.7 K/uL   Lymphs Abs 1.8 0.7 - 4.0 K/uL   Monocytes Absolute 0.5 0.1 - 1.0 K/uL   Eosinophils Absolute 0.1 0.0 - 0.7 K/uL   Basophils Absolute 0.0 0.0 - 0.1 K/uL   WBC Morphology ATYPICAL LYMPHOCYTES   Basic metabolic panel  Result Value Ref Range   Sodium 138 135 - 145 mmol/L   Potassium 3.9  3.5 - 5.1 mmol/L   Chloride 104 98 - 111 mmol/L   CO2 25 22 - 32 mmol/L   Glucose, Bld 99 70 - 99 mg/dL   BUN 13 6 - 20 mg/dL   Creatinine, Ser 1.15 (H) 0.44 - 1.00 mg/dL   Calcium 9.3 8.9 - 10.3 mg/dL   GFR calc non Af Amer 53 (L) >60 mL/min   GFR calc Af Amer >60 >60 mL/min   Anion gap 9 5 - 15  I-Stat beta hCG blood, ED  Result Value Ref Range   I-stat hCG, quantitative <5.0 <5 mIU/mL   Comment 3           EKG None  Radiology Ct Soft Tissue Neck W Contrast  Result Date: 10/19/2017 CLINICAL DATA:  Nonhealing mouth sore. EXAM: CT NECK WITH CONTRAST TECHNIQUE: Multidetector CT imaging of the neck was performed using the standard protocol following the bolus administration of intravenous contrast. CONTRAST:  23mL OMNIPAQUE IOHEXOL 300 MG/ML  SOLN COMPARISON:  03/18/2016 FINDINGS: Pharynx and larynx: There is  mild symmetric enlargement of the palatine tonsils, less than on the prior study. Mild prominence of the posterior nasopharyngeal/adenoidal soft tissues is similar to the prior study. The lingual tonsils are unremarkable. Chronic asymmetric enlargement of the right aryepiglottic fold is unchanged with resultant supraglottic airway narrowing. No fluid collection or significant inflammatory changes are seen in the parapharyngeal or retropharyngeal spaces. Salivary glands: No inflammation, mass, or stone. Thyroid: Unremarkable. Lymph nodes: There is chronic cervical lymphadenopathy with overall mild enlargement of multiple nodes compared to the prior study, particularly level I lymph nodes. A submental lymph node measures 11 mm in short axis, while submandibular nodes measure up to 10 mm on the right and 15 mm on the left. Level II lymph nodes measure up to 12 mm on the right and 13 mm on the left. A left level III lymph node measures 16 mm. Vascular: Major vascular structures of the neck are patent. Limited intracranial: Unremarkable. Visualized orbits: Unremarkable. Mastoids and visualized paranasal sinuses: Clear. Skeleton: There is poor dentition with numerous dental caries and multiple periapical lucencies. Extensive periapical lucency is again seen anteriorly in the left mandible associated with lateral incisor, canine, and premolar teeth with buccal cortical disruption and extensive overlying soft tissue swelling throughout the jaw region bilaterally extending into the lower lip. No abscess is identified. There is mild cervical disc degeneration. Upper chest: Clear lung apices. Other: None. IMPRESSION: 1. Left greater than right lower facial soft tissue swelling/inflammation suggesting cellulitis, likely of dental origin. No abscess. 2. Chronic cervical lymphadenopathy with interval increase most notable in level I which may reflect a component of acute reactive lymph node enlargement superimposed on a chronic  process. Electronically Signed   By: Logan Bores M.D.   On: 10/19/2017 15:18    Procedures Procedures (including critical care time)  Medications Ordered in ED Medications - No data to display   Initial Impression / Assessment and Plan / ED Course  I have reviewed the triage vital signs and the nursing notes.  Pertinent labs & imaging results that were available during my care of the patient were reviewed by me and considered in my medical decision making (see chart for details).   Patient presents to the emergency department today with left lower lip mucosal lesion and inflammation. Patient is nontoxic-appearing, notably hypertensive, doubt HTN emergency, patient aware of need for med compliance and recheck.  Exam as above, she does have a white thickened area to  the left lower lip mucosa, surrounding area is tender to palpation, she does seem to have some induration as well.  No gross abscess.  Will evaluate with basic labs as well as CT soft tissue neck with contrast.  Patient is leukopenic at 2.4, this is worsened from prior WBC count of 3.2 four months prior, ANC remains 0.0 as it was four months ago, this will require recheck.  Anemia is at baseline.  Creatinine is at baseline.  No significant electrolyte abnormalities.  CT study results appears consistent with cellulitis, thought to be likely of the dental origin per radiology, no appreciable abscess.  Patient does have chronic cervical lymphadenopathy with interval increase and likely component of acute reactive lymph node enlargement on superimposed chronic process.  Patient resting comfortable.  She is tolerating her own secretions without difficulty, airways patent.  Will start patient on clindamycin with close ENT follow-up and strict return precautions. No dental/oral surgery on call today. I discussed results, treatment plan, need for follow-up, and return precautions with the patient. Provided opportunity for questions, patient  confirmed understanding and is in agreement with plan.   Findings and plan of care discussed with supervising physician Dr. Rogene Houston who is in agreement.   Final Clinical Impressions(s) / ED Diagnoses   Final diagnoses:  Cellulitis, unspecified cellulitis site    ED Discharge Orders        Ordered    clindamycin (CLEOCIN) 150 MG capsule  3 times daily     10/19/17 1546       Gillie Crisci, [place] R, PA-C 10/19/17 1640    Fredia Sorrow, MD 10/22/17 [phone]

## 2017-11-10 ENCOUNTER — Ambulatory Visit: Payer: Self-pay | Attending: Family Medicine | Admitting: Physician Assistant

## 2017-11-10 VITALS — BP 162/100 | HR 76 | Temp 98.1°F | Wt 175.8 lb

## 2017-11-10 DIAGNOSIS — E785 Hyperlipidemia, unspecified: Secondary | ICD-10-CM | POA: Insufficient documentation

## 2017-11-10 DIAGNOSIS — Z79899 Other long term (current) drug therapy: Secondary | ICD-10-CM | POA: Insufficient documentation

## 2017-11-10 DIAGNOSIS — K089 Disorder of teeth and supporting structures, unspecified: Secondary | ICD-10-CM

## 2017-11-10 DIAGNOSIS — I110 Hypertensive heart disease with heart failure: Secondary | ICD-10-CM | POA: Insufficient documentation

## 2017-11-10 DIAGNOSIS — I5032 Chronic diastolic (congestive) heart failure: Secondary | ICD-10-CM | POA: Insufficient documentation

## 2017-11-10 DIAGNOSIS — I2510 Atherosclerotic heart disease of native coronary artery without angina pectoris: Secondary | ICD-10-CM | POA: Insufficient documentation

## 2017-11-10 DIAGNOSIS — J45909 Unspecified asthma, uncomplicated: Secondary | ICD-10-CM | POA: Insufficient documentation

## 2017-11-10 DIAGNOSIS — Z7982 Long term (current) use of aspirin: Secondary | ICD-10-CM | POA: Insufficient documentation

## 2017-11-10 DIAGNOSIS — F329 Major depressive disorder, single episode, unspecified: Secondary | ICD-10-CM | POA: Insufficient documentation

## 2017-11-10 DIAGNOSIS — Z87891 Personal history of nicotine dependence: Secondary | ICD-10-CM | POA: Insufficient documentation

## 2017-11-10 DIAGNOSIS — R590 Localized enlarged lymph nodes: Secondary | ICD-10-CM | POA: Insufficient documentation

## 2017-11-10 DIAGNOSIS — F419 Anxiety disorder, unspecified: Secondary | ICD-10-CM | POA: Insufficient documentation

## 2017-11-10 DIAGNOSIS — Z881 Allergy status to other antibiotic agents status: Secondary | ICD-10-CM | POA: Insufficient documentation

## 2017-11-10 DIAGNOSIS — R591 Generalized enlarged lymph nodes: Secondary | ICD-10-CM

## 2017-11-10 DIAGNOSIS — K130 Diseases of lips: Secondary | ICD-10-CM | POA: Insufficient documentation

## 2017-11-10 DIAGNOSIS — Z09 Encounter for follow-up examination after completed treatment for conditions other than malignant neoplasm: Secondary | ICD-10-CM | POA: Insufficient documentation

## 2017-11-10 DIAGNOSIS — Z8673 Personal history of transient ischemic attack (TIA), and cerebral infarction without residual deficits: Secondary | ICD-10-CM | POA: Insufficient documentation

## 2017-11-10 DIAGNOSIS — K0889 Other specified disorders of teeth and supporting structures: Secondary | ICD-10-CM | POA: Insufficient documentation

## 2017-11-10 DIAGNOSIS — D72819 Decreased white blood cell count, unspecified: Secondary | ICD-10-CM | POA: Insufficient documentation

## 2017-11-10 DIAGNOSIS — I272 Pulmonary hypertension, unspecified: Secondary | ICD-10-CM | POA: Insufficient documentation

## 2017-11-10 DIAGNOSIS — I10 Essential (primary) hypertension: Secondary | ICD-10-CM

## 2017-11-10 MED ORDER — LISINOPRIL 10 MG PO TABS: 10.0000 mg | ORAL_TABLET | Freq: Every day | ORAL | 3 refills | Status: DC

## 2017-11-10 NOTE — Patient Instructions (Signed)
Check blood pressure out of the office 3 times/week and record.  Bring to next visit.

## 2017-11-10 NOTE — Progress Notes (Signed)
Patient ID: Morgan Bean, female   DOB: [birth year], 52 y.o.   MRN: [id number]      Morgan Bean, is a 52 y.o. female  UDJ:[id number]  HYI:[id number]  DOB - 07-May-1965  Subjective:  Chief Complaint and HPI: Morgan Bean is a 52 y.o. female here today to establish care and for a follow up visit after being seen in the ED 10/19/2017 for cellulitis of the lip(per CT scan).  Doing much better.  Finished antibiotics.  No f/c.  Has ENT appt next week.  Previously received Primary care in [facility], but now living here.  Denies current/recent use of cocaine.  Previously on Clonidine for BP.  Doesn't need refills currently.  Taking 5mg  lisinopril in addition to amlodipine and lasix.     From ED note 10/19/2017: 52 y.o. female with history of tobacco abuse, pancytopenia, chronic diastolic HF, CAD, DCIS of breast, hypertension, hyperlipidemia, polysubstance abuse, prior stroke, and leukocytoclastic vasculitis who presents to the emergency department with progressively worsening oral lesion for the past 2 to 3 weeks.  Patient states that she has a white area on the inside of her left lower lip which has been progressively increasing in size and becoming progressively more uncomfortable.  She is that the lesion itself is not painful however the surrounding skin is.  She rates her discomfort a 10 out of 10 in severity, it is worse when she is attempting to eat, no specific alleviating factors, no interventions prior to arrival.  She has some associated nausea without vomiting as well as swollen lymph nodes.  Denies fever, abdominal pain, trouble breathing, or intraoral drainage.  She denies history of similar.  From A/P: Patient is leukopenic at 2.4, this is worsened from prior WBC count of 3.2 four months prior, ANC remains 0.0 as it was four months ago, this will require recheck.  Anemia is at baseline.  Creatinine is at baseline.  No significant electrolyte abnormalities.  CT study results appears consistent  with cellulitis, thought to be likely of the dental origin per radiology, no appreciable abscess.  Patient does have chronic cervical lymphadenopathy with interval increase and likely component of acute reactive lymph node enlargement on superimposed chronic process.  Patient resting comfortable.  She is tolerating her own secretions without difficulty, airways patent.  Will start patient on clindamycin with close ENT follow-up and strict return precautions. No dental/oral surgery on call today. I discussed results, treatment plan, need for follow-up, and return precautions with the patient. Provided opportunity for questions, patient confirmed understanding and is in agreement with plan.   ED/Hospital notes reviewed and summarized aboved.   Social History:  Not working, living in [place] now/moved from [place] area.   ROS:   Constitutional:  No f/c, No night sweats, No unexplained weight loss. EENT:  No vision changes, No blurry vision, No hearing changes. No mouth, throat, or ear problems.  Respiratory: No cough, No SOB Cardiac: No CP, no palpitations GI:  No abd pain, No N/V/D. GU: No Urinary s/sx Musculoskeletal: No joint pain Neuro: No headache, no dizziness, no motor weakness.  Skin: No rash Endocrine:  No polydipsia. No polyuria.  Psych: Denies SI/HI  No problems updated.  ALLERGIES: Allergies  Allergen Reactions  . Ciprofloxacin Itching    PAST MEDICAL HISTORY: Past Medical History:  Diagnosis Date  . Anemia of other chronic disease 11/13/2013  . Anginal pain ([place])    none in past year  . Anxiety   . Asthma   . Candidiasis  06/23/2013  . Chronic diastolic CHF (congestive heart failure) ([place])   . Constipation   . Coronary artery disease    Lexiscan myoview (10/12) with significant ST depression upon Lexiscan injection but no ischemia or infarction on perfusion images. Left heart cath (10/12): 30% mid LAD, 30% ostial D1, 50% ostial D2.    [name] Cough 11/13/2013  . DCIS  (ductal carcinoma in situ) of breast   . Depression   . Headache 02/22/2014  . Hx of cardiovascular stress test    Lexiscan Myoview (2/16): Breast attenuation artifact, no ischemia, EF 64%; low risk  . Hyperlipemia   . Hypertension   . Iron deficiency    hx of  . Leukocytoclastic vasculitis ([place])    Rash across lower body, occurred in 9/11, diagnosed by skin biopsy. ANA positive. Thought to be secondary to cocaine use.  . Leukopenia 03/21/2013  . Lymphadenopathy 03/21/2013  . Lymphadenopathy 01/30/2014  . Mental disorder   . Pancytopenia ([place])   . Pancytopenia, acquired ([place]) 07/16/2015  . Polysubstance abuse ([place])    Prior cocaine  . Pulmonary HTN ([place])    Echo (9/11) with EF 65%, mild LVH, mild AI, mild MR, moderate to possibly severe TR with PA systolic pressure 52 mmHg. Echo (10/12): severe LV hypertrophy, EF 60-65%, mild MR, mild AI, moderate to severe tricuspid regurgitation, PA systolic pressure 38 mmHg.  Echo 4/14: moderate LVH, EF 65%, normal wall motion, diastolic dysfunction, mild AI, mild MR  . Shortness of breath    a. PFTs 5/14:  FEV1/FVC 99% predicted; FVC 60% predicted; DLCO mildly reduced, mild restriction, air trapping.;  b.  seen by pulmo (Dr. [name]) 5/14: mainly upper airway symptoms - ACE d/c'd (sx's better)  . Stroke ([place]) 2010ish  . Syphilis 04/25/2013  . Tobacco abuse   . Tricuspid regurgitation   . Unspecified deficiency anemia 03/29/2013    MEDICATIONS AT HOME: Prior to Admission medications   Medication Sig Start Date End Date Taking? Authorizing Provider  amLODipine (NORVASC) 10 MG tablet Take 1 tablet (10 mg total) by mouth daily. 08/01/14  Yes Burtis Junes, NP  aspirin EC 325 MG tablet Take 325 mg by mouth daily.   Yes [provider]  buPROPion (WELLBUTRIN SR) 150 MG 12 hr tablet Take 150 mg by mouth 2 (two) times daily.   Yes [provider]  citalopram (CELEXA) 40 MG tablet Take 1 tablet (40 mg total) by mouth daily. 08/25/13  Yes  Tresa Garter, MD  furosemide (LASIX) 20 MG tablet Take 2 tablets (40 mg total) by mouth 2 (two) times daily. Patient taking differently: Take 20-40 mg by mouth 2 (two) times daily. Take 2 tablets every morning and 1 tablet every evening 04/18/15  Yes [name], MD  hydrOXYzine (VISTARIL) 25 MG capsule Take 1 capsule by mouth 4 (four) times daily as needed for anxiety. 12/06/15  Yes [provider]  loratadine (CLARITIN) 10 MG tablet Take 1 tablet (10 mg total) by mouth daily. 09/20/13  Yes Lance Bosch, NP  Multiple Vitamin (MULTI-VITAMINS) TABS Take 1 tablet by mouth daily.   Yes [provider]  polyethylene glycol powder (GLYCOLAX/MIRALAX) powder Take 17 g by mouth daily as needed (constipation).    Yes [provider]  ranitidine (ZANTAC) 150 MG tablet Take 1 tablet (150 mg total) by mouth 2 (two) times daily. 08/21/15  Yes Langeland, Dawn T, MD  topiramate (TOPAMAX) 25 MG tablet Take 2 tablets at night 03/13/15  Yes Aquino,  Lezlie Octave, MD  traZODone (DESYREL) 150 MG tablet Take 150 mg by mouth at bedtime. 01/07/16  Yes [provider]  albuterol (PROVENTIL HFA;VENTOLIN HFA) 108 (90 Base) MCG/ACT inhaler Inhale 2 puffs into the lungs every 6 (six) hours as needed for wheezing or shortness of breath. Patient not taking: Reported on 11/10/2017 12/06/15   Tresa Garter, MD  atorvastatin (LIPITOR) 40 MG tablet TAKE 1 TABLET BY MOUTH IN THE EVENING Patient not taking: Reported on 11/10/2017 02/18/15   [name], MD  cyanocobalamin 500 MCG tablet Take 500 mcg by mouth daily.    [provider]  DULERA 100-5 MCG/ACT AERO Inhale 2 puffs into the lungs 2 (two) times daily. 02/10/16   [provider]  ferrous sulfate 325 (65 FE) MG tablet Take 650 mg by mouth daily with breakfast.    [provider]  fluticasone (FLONASE) 50 MCG/ACT nasal spray Place 2 sprays into both nostrils daily. Patient not taking: Reported on  03/17/2016 05/08/15   Elsie Stain, MD  Fluticasone-Salmeterol (ADVAIR) 250-50 MCG/DOSE AEPB Inhale 1 puff into the lungs 2 (two) times daily. Patient not taking: Reported on 03/17/2016 07/24/15   Elsie Stain, MD  lisinopril (PRINIVIL,ZESTRIL) 10 MG tablet Take 1 tablet (10 mg total) by mouth daily. 11/10/17   Argentina Donovan, PA-C  neomycin-bacitracin-polymyxin (NEOSPORIN) ointment Apply 1 application topically every 12 (twelve) hours. apply to left axilla boils. Patient not taking: Reported on 03/17/2016 08/21/15   Maren Reamer, MD  predniSONE (STERAPRED UNI-PAK 21 TAB) 10 MG (21) TBPK tablet Take 6 tabs by mouth daily  for 2 days, then 5 tabs for 2 days, then 4 tabs for 2 days, then 3 tabs for 2 days, 2 tabs for 2 days, then 1 tab by mouth daily for 2 days Patient not taking: Reported on 11/10/2017 03/18/16   Arlean Hopping C, PA-C     Objective:  EXAM:   Vitals:   11/10/17 0911  BP: (!) 162/100  Pulse: 76  Temp: 98.1 F (36.7 C)  TempSrc: Oral  SpO2: 100%  Weight: 175 lb 12.8 oz (79.7 kg)    General appearance : A&OX3. NAD. Non-toxic-appearing HEENT: Atraumatic and Normocephalic.  PERRLA. EOM intact.  TM clear B. Mouth-MMM-bottom L lower lip with no ulceration now, mucosa is intact.(much improved over ED photos)., post pharynx WNL w/o erythema, No PND. Poor dentition with no obvious abscess.   Neck: supple, no JVD. No palpable cervical lymphadenopathy. No thyromegaly Chest/Lungs:  Breathing-non-labored, Good air entry bilaterally, breath sounds normal without rales, rhonchi, or wheezing  CVS: S1 S2 regular, no murmurs, gallops, rubs  Extremities: Bilateral Lower Ext shows no edema, both legs are warm to touch with = pulse throughout Neurology:  CN II-XII grossly intact, Non focal.   Psych:  TP linear. J/I WNL. Normal speech. Appropriate eye contact and affect.  Skin:  No Rash  Data Review Lab Results  Component Value Date   HGBA1C 5.4 08/21/2015   HGBA1C 5.2  10/04/2013   HGBA1C 5.5 04/06/2012     Assessment & Plan   1. Essential hypertension Uncontrolled.  Increase dose of Lisinopril and continue amlodipine and lasix.   - lisinopril (PRINIVIL,ZESTRIL) 10 MG tablet; Take 1 tablet (10 mg total) by mouth daily.  Dispense: 90 tablet; Refill: 3 - CBC with Differential/Platelet - Basic metabolic panel  2. History of CVA (cerebrovascular accident) 81mg  aspirin daily and BP control imperative  3. Poor dentition - Ambulatory referral to Dentistry  4. Encounter for examination following treatment at hospital Much improved-has ENT appt next week.  Reviewed and summarized hospital notes above which required additional time  5. Head and neck lymphadenopathy Much improved-has ENT appt next week.  Patient have been counseled extensively about nutrition and exercise  Return in about 1 month (around 12/11/2017) for assign PCP.  The patient was given clear instructions to go to ER or return to medical center if symptoms don't improve, worsen or new problems develop. The patient verbalized understanding. The patient was told to call to get lab results if they haven't heard anything in the next week.     Freeman Caldron, PA-C [facility], [place]   11/10/2017, 9:22 AM

## 2017-11-11 ENCOUNTER — Telehealth: Payer: Self-pay

## 2017-11-11 LAB — CBC WITH DIFFERENTIAL/PLATELET
Basophils Absolute: 0.1 10*3/uL (ref 0.0–0.2)
Basos: 1 %
EOS (ABSOLUTE): 0.2 10*3/uL (ref 0.0–0.4)
EOS: 4 %
HEMATOCRIT: 34.9 % (ref 34.0–46.6)
HEMOGLOBIN: 11.6 g/dL (ref 11.1–15.9)
IMMATURE GRANS (ABS): 0 10*3/uL (ref 0.0–0.1)
IMMATURE GRANULOCYTES: 0 %
LYMPHS: 44 %
Lymphocytes Absolute: 2.4 10*3/uL (ref 0.7–3.1)
MCH: 24.4 pg — AB (ref 26.6–33.0)
MCHC: 33.2 g/dL (ref 31.5–35.7)
MCV: 73 fL — ABNORMAL LOW (ref 79–97)
MONOCYTES: 16 %
Monocytes Absolute: 0.8 10*3/uL (ref 0.1–0.9)
NEUTROS ABS: 1.8 10*3/uL (ref 1.4–7.0)
NEUTROS PCT: 35 %
PLATELETS: 287 10*3/uL (ref 150–450)
RBC: 4.76 x10E6/uL (ref 3.77–5.28)
RDW: 16.4 % — ABNORMAL HIGH (ref 12.3–15.4)
WBC: 5.3 10*3/uL (ref 3.4–10.8)

## 2017-11-11 LAB — BASIC METABOLIC PANEL
BUN/Creatinine Ratio: 13 (ref 9–23)
BUN: 14 mg/dL (ref 6–24)
CALCIUM: 9.3 mg/dL (ref 8.7–10.2)
CHLORIDE: 102 mmol/L (ref 96–106)
CO2: 20 mmol/L (ref 20–29)
CREATININE: 1.07 mg/dL — AB (ref 0.57–1.00)
GFR, EST AFRICAN AMERICAN: 68 mL/min/{1.73_m2} (ref >59)
GFR, EST NON AFRICAN AMERICAN: 59 mL/min/{1.73_m2} — AB (ref >59)
Glucose: 87 mg/dL (ref 65–99)
Potassium: 4.4 mmol/L (ref 3.5–5.2)
Sodium: 137 mmol/L (ref 134–144)

## 2017-11-11 NOTE — Telephone Encounter (Signed)
Patient was called and informed of lab results. 

## 2017-11-11 NOTE — Telephone Encounter (Signed)
-----   Message from Argentina Donovan, [place] sent at 11/11/2017  8:14 AM EDT ----- Your blood count has returned to normal.  Your hemoglobin is normal, so, you are on the correct dose of Iron supplement. Your kidney function indicates mild dehydration, so, increase your water intake and we will recheck this at your follow up.  Blood sugar is normal.  Follow up as planned.  Thanks, Freeman Caldron, PA-C

## 2018-01-04 ENCOUNTER — Ambulatory Visit: Payer: Self-pay | Admitting: Family Medicine

## 2018-03-08 ENCOUNTER — Inpatient Hospital Stay (HOSPITAL_COMMUNITY)
Admission: EM | Admit: 2018-03-08 | Discharge: 2018-03-14 | DRG: 291 | Disposition: A | Discharge status: Home Health | Payer: Medicaid Other | Attending: Internal Medicine | Admitting: Internal Medicine

## 2018-03-08 ENCOUNTER — Encounter (HOSPITAL_COMMUNITY): Payer: Self-pay | Admitting: Emergency Medicine

## 2018-03-08 ENCOUNTER — Emergency Department (HOSPITAL_COMMUNITY): Payer: Medicaid Other

## 2018-03-08 ENCOUNTER — Other Ambulatory Visit: Payer: Self-pay

## 2018-03-08 DIAGNOSIS — I2510 Atherosclerotic heart disease of native coronary artery without angina pectoris: Secondary | ICD-10-CM | POA: Diagnosis present

## 2018-03-08 DIAGNOSIS — I4891 Unspecified atrial fibrillation: Secondary | ICD-10-CM

## 2018-03-08 DIAGNOSIS — Z7982 Long term (current) use of aspirin: Secondary | ICD-10-CM

## 2018-03-08 DIAGNOSIS — E876 Hypokalemia: Secondary | ICD-10-CM | POA: Diagnosis present

## 2018-03-08 DIAGNOSIS — J9601 Acute respiratory failure with hypoxia: Secondary | ICD-10-CM | POA: Diagnosis present

## 2018-03-08 DIAGNOSIS — Z8673 Personal history of transient ischemic attack (TIA), and cerebral infarction without residual deficits: Secondary | ICD-10-CM

## 2018-03-08 DIAGNOSIS — N179 Acute kidney failure, unspecified: Secondary | ICD-10-CM | POA: Diagnosis present

## 2018-03-08 DIAGNOSIS — I160 Hypertensive urgency: Secondary | ICD-10-CM | POA: Diagnosis present

## 2018-03-08 DIAGNOSIS — E785 Hyperlipidemia, unspecified: Secondary | ICD-10-CM | POA: Diagnosis present

## 2018-03-08 DIAGNOSIS — G4485 Primary stabbing headache: Secondary | ICD-10-CM

## 2018-03-08 DIAGNOSIS — J810 Acute pulmonary edema: Secondary | ICD-10-CM

## 2018-03-08 DIAGNOSIS — F339 Major depressive disorder, recurrent, unspecified: Secondary | ICD-10-CM | POA: Diagnosis present

## 2018-03-08 DIAGNOSIS — I471 Supraventricular tachycardia: Secondary | ICD-10-CM | POA: Diagnosis present

## 2018-03-08 DIAGNOSIS — F418 Other specified anxiety disorders: Secondary | ICD-10-CM | POA: Diagnosis present

## 2018-03-08 DIAGNOSIS — I340 Nonrheumatic mitral (valve) insufficiency: Secondary | ICD-10-CM

## 2018-03-08 DIAGNOSIS — F5105 Insomnia due to other mental disorder: Secondary | ICD-10-CM | POA: Diagnosis present

## 2018-03-08 DIAGNOSIS — F333 Major depressive disorder, recurrent, severe with psychotic symptoms: Secondary | ICD-10-CM | POA: Diagnosis present

## 2018-03-08 DIAGNOSIS — I2729 Other secondary pulmonary hypertension: Secondary | ICD-10-CM | POA: Diagnosis present

## 2018-03-08 DIAGNOSIS — Z8249 Family history of ischemic heart disease and other diseases of the circulatory system: Secondary | ICD-10-CM

## 2018-03-08 DIAGNOSIS — Z823 Family history of stroke: Secondary | ICD-10-CM

## 2018-03-08 DIAGNOSIS — I4819 Other persistent atrial fibrillation: Secondary | ICD-10-CM | POA: Diagnosis present

## 2018-03-08 DIAGNOSIS — J449 Chronic obstructive pulmonary disease, unspecified: Secondary | ICD-10-CM | POA: Diagnosis present

## 2018-03-08 DIAGNOSIS — F17210 Nicotine dependence, cigarettes, uncomplicated: Secondary | ICD-10-CM | POA: Diagnosis present

## 2018-03-08 DIAGNOSIS — I081 Rheumatic disorders of both mitral and tricuspid valves: Secondary | ICD-10-CM | POA: Diagnosis present

## 2018-03-08 DIAGNOSIS — I480 Paroxysmal atrial fibrillation: Secondary | ICD-10-CM

## 2018-03-08 DIAGNOSIS — Z79899 Other long term (current) drug therapy: Secondary | ICD-10-CM

## 2018-03-08 DIAGNOSIS — Z86000 Personal history of in-situ neoplasm of breast: Secondary | ICD-10-CM

## 2018-03-08 DIAGNOSIS — F1410 Cocaine abuse, uncomplicated: Secondary | ICD-10-CM | POA: Diagnosis present

## 2018-03-08 DIAGNOSIS — I5033 Acute on chronic diastolic (congestive) heart failure: Secondary | ICD-10-CM

## 2018-03-08 DIAGNOSIS — F1411 Cocaine abuse, in remission: Secondary | ICD-10-CM | POA: Diagnosis present

## 2018-03-08 DIAGNOSIS — F1910 Other psychoactive substance abuse, uncomplicated: Secondary | ICD-10-CM | POA: Diagnosis present

## 2018-03-08 DIAGNOSIS — Z23 Encounter for immunization: Secondary | ICD-10-CM

## 2018-03-08 DIAGNOSIS — J4489 Other specified chronic obstructive pulmonary disease: Secondary | ICD-10-CM | POA: Diagnosis present

## 2018-03-08 DIAGNOSIS — I110 Hypertensive heart disease with heart failure: Principal | ICD-10-CM | POA: Diagnosis present

## 2018-03-08 DIAGNOSIS — G4700 Insomnia, unspecified: Secondary | ICD-10-CM | POA: Diagnosis present

## 2018-03-08 DIAGNOSIS — F419 Anxiety disorder, unspecified: Secondary | ICD-10-CM | POA: Diagnosis present

## 2018-03-08 DIAGNOSIS — Z888 Allergy status to other drugs, medicaments and biological substances status: Secondary | ICD-10-CM

## 2018-03-08 LAB — BASIC METABOLIC PANEL
ANION GAP: 8 (ref 5–15)
BUN: 22 mg/dL — ABNORMAL HIGH (ref 6–20)
CALCIUM: 9.2 mg/dL (ref 8.9–10.3)
CO2: 19 mmol/L — ABNORMAL LOW (ref 22–32)
Chloride: 111 mmol/L (ref 98–111)
Creatinine, Ser: 1.18 mg/dL — ABNORMAL HIGH (ref 0.44–1.00)
GFR, EST NON AFRICAN AMERICAN: 52 mL/min — AB (ref >60)
GLUCOSE: 115 mg/dL — AB (ref 70–99)
POTASSIUM: 3.9 mmol/L (ref 3.5–5.1)
SODIUM: 138 mmol/L (ref 135–145)

## 2018-03-08 LAB — CBC
HCT: 41.4 % (ref 36.0–46.0)
HEMOGLOBIN: 12.4 g/dL (ref 12.0–15.0)
MCH: 23.8 pg — AB (ref 26.0–34.0)
MCHC: 30 g/dL (ref 30.0–36.0)
MCV: 79.3 fL — ABNORMAL LOW (ref 80.0–100.0)
PLATELETS: 150 10*3/uL (ref 150–400)
RBC: 5.22 MIL/uL — AB (ref 3.87–5.11)
RDW: 18.8 % — ABNORMAL HIGH (ref 11.5–15.5)
WBC: 5.8 10*3/uL (ref 4.0–10.5)
nRBC: 0 % (ref 0.0–0.2)

## 2018-03-08 LAB — RAPID URINE DRUG SCREEN, HOSP PERFORMED
AMPHETAMINES: NOT DETECTED
Barbiturates: NOT DETECTED
Benzodiazepines: NOT DETECTED
Cocaine: NOT DETECTED
Opiates: NOT DETECTED
TETRAHYDROCANNABINOL: NOT DETECTED

## 2018-03-08 LAB — I-STAT TROPONIN, ED: TROPONIN I, POC: 0.03 ng/mL (ref 0.00–0.08)

## 2018-03-08 LAB — BRAIN NATRIURETIC PEPTIDE: B NATRIURETIC PEPTIDE 5: 497.1 pg/mL — AB (ref 0.0–100.0)

## 2018-03-08 LAB — D-DIMER, QUANTITATIVE: D-Dimer, Quant: 2 ug/mL-FEU — ABNORMAL HIGH (ref 0.00–0.50)

## 2018-03-08 MED ORDER — ACETAMINOPHEN 325 MG PO TABS: 650.0000 mg | ORAL_TABLET | ORAL | Status: DC | PRN

## 2018-03-08 MED ORDER — HYDRALAZINE HCL 20 MG/ML IJ SOLN: 10.0000 mg | INTRAMUSCULAR | Status: DC | PRN

## 2018-03-08 MED ORDER — MOMETASONE FURO-FORMOTEROL FUM 100-5 MCG/ACT IN AERO
2.0000 | INHALATION_SPRAY | Freq: Two times a day (BID) | RESPIRATORY_TRACT | Status: DC
  Administered 2018-03-09 – 2018-03-14 (×10): 2 via RESPIRATORY_TRACT
  Filled 2018-03-08 (×3): qty 8.8

## 2018-03-08 MED ORDER — FUROSEMIDE 10 MG/ML IJ SOLN
20.0000 mg | Freq: Once | INTRAMUSCULAR | Status: AC
Start: 2018-03-08 — End: 2018-03-08
  Administered 2018-03-08: 20 mg via INTRAVENOUS
  Filled 2018-03-08: qty 2

## 2018-03-08 MED ORDER — SODIUM CHLORIDE 0.9% FLUSH: 3.0000 mL | INTRAVENOUS | Status: DC | PRN

## 2018-03-08 MED ORDER — DILTIAZEM HCL-DEXTROSE 100-5 MG/100ML-% IV SOLN (PREMIX)
5.0000 mg/h | INTRAVENOUS | Status: DC
  Administered 2018-03-08: 5 mg/h via INTRAVENOUS
  Filled 2018-03-08: qty 100

## 2018-03-08 MED ORDER — TRAZODONE HCL 50 MG PO TABS
150.0000 mg | ORAL_TABLET | Freq: Every day | ORAL | Status: DC
  Administered 2018-03-08 – 2018-03-13 (×6): 150 mg via ORAL
  Filled 2018-03-08 (×3): qty 3
  Filled 2018-03-08 (×3): qty 1

## 2018-03-08 MED ORDER — ATORVASTATIN CALCIUM 40 MG PO TABS
40.0000 mg | ORAL_TABLET | Freq: Every day | ORAL | Status: DC
  Administered 2018-03-09 – 2018-03-14 (×6): 40 mg via ORAL
  Filled 2018-03-08 (×6): qty 1

## 2018-03-08 MED ORDER — HYDROXYZINE HCL 25 MG PO TABS: 25.0000 mg | ORAL_TABLET | Freq: Four times a day (QID) | ORAL | Status: DC | PRN

## 2018-03-08 MED ORDER — DILTIAZEM LOAD VIA INFUSION
10.0000 mg | Freq: Once | INTRAVENOUS | Status: AC
  Administered 2018-03-08: 10 mg via INTRAVENOUS
  Filled 2018-03-08: qty 10

## 2018-03-08 MED ORDER — SODIUM CHLORIDE 0.9% FLUSH
3.0000 mL | Freq: Two times a day (BID) | INTRAVENOUS | Status: DC
  Administered 2018-03-08 – 2018-03-13 (×9): 3 mL via INTRAVENOUS

## 2018-03-08 MED ORDER — FUROSEMIDE 10 MG/ML IJ SOLN
40.0000 mg | Freq: Two times a day (BID) | INTRAMUSCULAR | Status: DC
  Administered 2018-03-09 – 2018-03-10 (×3): 40 mg via INTRAVENOUS
  Filled 2018-03-08 (×3): qty 4

## 2018-03-08 MED ORDER — SODIUM CHLORIDE 0.9 % IV BOLUS: 500.0000 mL | Freq: Once | INTRAVENOUS | Status: DC

## 2018-03-08 MED ORDER — LISINOPRIL 10 MG PO TABS
10.0000 mg | ORAL_TABLET | Freq: Every day | ORAL | Status: DC
  Administered 2018-03-09: 10 mg via ORAL
  Filled 2018-03-08: qty 1

## 2018-03-08 MED ORDER — CITALOPRAM HYDROBROMIDE 20 MG PO TABS
40.0000 mg | ORAL_TABLET | Freq: Every day | ORAL | Status: DC
  Administered 2018-03-09 – 2018-03-14 (×7): 40 mg via ORAL
  Filled 2018-03-08: qty 1
  Filled 2018-03-08 (×3): qty 2
  Filled 2018-03-08 (×2): qty 1
  Filled 2018-03-08 (×2): qty 2
  Filled 2018-03-08: qty 1
  Filled 2018-03-08: qty 2
  Filled 2018-03-08 (×2): qty 1

## 2018-03-08 MED ORDER — ASPIRIN EC 325 MG PO TBEC
325.0000 mg | DELAYED_RELEASE_TABLET | Freq: Every day | ORAL | Status: DC
  Administered 2018-03-09 – 2018-03-10 (×2): 325 mg via ORAL
  Filled 2018-03-08 (×2): qty 1

## 2018-03-08 MED ORDER — ONDANSETRON HCL 4 MG/2ML IJ SOLN: 4.0000 mg | Freq: Four times a day (QID) | INTRAMUSCULAR | Status: DC | PRN

## 2018-03-08 MED ORDER — DILTIAZEM HCL-DEXTROSE 100-5 MG/100ML-% IV SOLN (PREMIX)
5.0000 mg/h | INTRAVENOUS | Status: DC
  Administered 2018-03-09 (×2): 10 mg/h via INTRAVENOUS
  Filled 2018-03-08 (×3): qty 100

## 2018-03-08 MED ORDER — ENOXAPARIN SODIUM 40 MG/0.4ML ~~LOC~~ SOLN
40.0000 mg | SUBCUTANEOUS | Status: DC
  Administered 2018-03-09 – 2018-03-10 (×2): 40 mg via SUBCUTANEOUS
  Filled 2018-03-08 (×2): qty 0.4

## 2018-03-08 MED ORDER — SODIUM CHLORIDE 0.9 % IV SOLN: 250.0000 mL | INTRAVENOUS | Status: DC | PRN

## 2018-03-08 MED ORDER — FAMOTIDINE 20 MG PO TABS
20.0000 mg | ORAL_TABLET | Freq: Two times a day (BID) | ORAL | Status: DC
  Administered 2018-03-08 – 2018-03-14 (×12): 20 mg via ORAL
  Filled 2018-03-08 (×12): qty 1

## 2018-03-08 MED ORDER — TOPIRAMATE 25 MG PO TABS
50.0000 mg | ORAL_TABLET | Freq: Every day | ORAL | Status: DC
  Administered 2018-03-09 – 2018-03-13 (×6): 50 mg via ORAL
  Filled 2018-03-08 (×7): qty 2

## 2018-03-08 MED ORDER — IOPAMIDOL (ISOVUE-370) INJECTION 76%
100.0000 mL | Freq: Once | INTRAVENOUS | Status: AC | PRN
  Administered 2018-03-08: 100 mL via INTRAVENOUS

## 2018-03-08 MED ORDER — BUPROPION HCL ER (SR) 150 MG PO TB12
150.0000 mg | ORAL_TABLET | Freq: Two times a day (BID) | ORAL | Status: DC
  Administered 2018-03-09 – 2018-03-14 (×11): 150 mg via ORAL
  Filled 2018-03-08 (×11): qty 1

## 2018-03-08 MED ORDER — AMLODIPINE BESYLATE 10 MG PO TABS
10.0000 mg | ORAL_TABLET | Freq: Every day | ORAL | Status: DC
  Administered 2018-03-09: 10 mg via ORAL
  Filled 2018-03-08: qty 1

## 2018-03-08 MED ORDER — IOPAMIDOL (ISOVUE-370) INJECTION 76%
INTRAVENOUS | Status: AC
  Filled 2018-03-08: qty 100

## 2018-03-08 NOTE — ED Notes (Signed)
Patient transported to CT 

## 2018-03-08 NOTE — ED Provider Notes (Signed)
[facility] EMERGENCY DEPARTMENT Provider Note   CSN: [id number] Arrival date & time: 03/08/18  1735     History   Chief Complaint Chief Complaint  Patient presents with  . Shortness of Breath    HPI Morgan Bean is a 52 y.o. female.  The history is provided by the patient.  Shortness of Breath  This is a new problem. The problem occurs continuously.The current episode started more than 2 days ago. The problem has been gradually worsening. Associated symptoms include orthopnea. Pertinent negatives include no fever, no sore throat, no ear pain, no cough, no sputum production, no wheezing, no PND, no chest pain, no vomiting, no abdominal pain, no rash, no leg pain and no leg swelling. It is unknown (Cocaine use, no complaint with medications, hx of atrial flutter s/p ablation. ) what precipitated the problem. She has tried nothing for the symptoms. The treatment provided no relief.    Past Medical History:  Diagnosis Date  . Anemia of other chronic disease 11/13/2013  . Anginal pain ([place])    none in past year  . Anxiety   . Asthma   . Candidiasis 06/23/2013  . Chronic diastolic CHF (congestive heart failure) ([place])   . Constipation   . Coronary artery disease    Lexiscan myoview (10/12) with significant ST depression upon Lexiscan injection but no ischemia or infarction on perfusion images. Left heart cath (10/12): 30% mid LAD, 30% ostial D1, 50% ostial D2.    [name] Cough 11/13/2013  . DCIS (ductal carcinoma in situ) of breast   . Depression   . Headache 02/22/2014  . Hx of cardiovascular stress test    Lexiscan Myoview (2/16): Breast attenuation artifact, no ischemia, EF 64%; low risk  . Hyperlipemia   . Hypertension   . Iron deficiency    hx of  . Leukocytoclastic vasculitis ([place])    Rash across lower body, occurred in 9/11, diagnosed by skin biopsy. ANA positive. Thought to be secondary to cocaine use.  . Leukopenia 03/21/2013  . Lymphadenopathy 03/21/2013    . Lymphadenopathy 01/30/2014  . Mental disorder   . Pancytopenia ([place])   . Pancytopenia, acquired ([place]) 07/16/2015  . Polysubstance abuse ([place])    Prior cocaine  . Pulmonary HTN ([place])    Echo (9/11) with EF 65%, mild LVH, mild AI, mild MR, moderate to possibly severe TR with PA systolic pressure 52 mmHg. Echo (10/12): severe LV hypertrophy, EF 60-65%, mild MR, mild AI, moderate to severe tricuspid regurgitation, PA systolic pressure 38 mmHg.  Echo 4/14: moderate LVH, EF 65%, normal wall motion, diastolic dysfunction, mild AI, mild MR  . Shortness of breath    a. PFTs 5/14:  FEV1/FVC 99% predicted; FVC 60% predicted; DLCO mildly reduced, mild restriction, air trapping.;  b.  seen by pulmo (Dr. [name]) 5/14: mainly upper airway symptoms - ACE d/c'd (sx's better)  . Stroke ([place]) 2010ish  . Syphilis 04/25/2013  . Tobacco abuse   . Tricuspid regurgitation   . Unspecified deficiency anemia 03/29/2013    Patient Active Problem List   Diagnosis Date Noted  . Acute on chronic diastolic CHF (congestive heart failure) ([place]) 03/08/2018  . Hypertensive urgency 03/08/2018  . Atrial tachycardia ([place]) 03/08/2018  . Acute respiratory failure with hypoxia ([place]) 03/08/2018  . Pancytopenia, acquired ([place]) 07/16/2015  . Head and neck lymphadenopathy 07/16/2015  . 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 Absolute anemia 02/25/2015  . Preventive measure 12/27/2014  . 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  History of stroke 12/26/2014  . 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 Headache 02/22/2014  . Dental caries 01/09/2014  . Cough 11/13/2013  . Tobacco abuse 11/13/2013  . Swelling of gums 11/13/2013  . Fatigue 11/10/2013  . Intracranial atherosclerosis 11/10/2013  . Headache(784.0) 10/04/2013  . Hyperreflexia 10/04/2013  . TIA (transient ischemic attack) 10/04/2013  . 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 Fibroid uterus 09/14/2012  . Perimenopause 09/14/2012  . Chronic cough  08/12/2012  . History of CVA (cerebrovascular accident) 08/09/2012  . Chest pain, mid sternal 04/07/2012  . Pulmonary hypertension ([place]) 04/06/2012  . Polysubstance abuse ([place]) 04/06/2012  . History of cocaine abuse ([place]) 11/16/2011  . Major depressive disorder, recurrent episode with psychotic features. 11/15/2011    Class: Acute  . Chronic diastolic heart failure ([place]) 02/24/2011  . CAD (coronary artery disease) 02/24/2011  . Hyperlipidemia 01/06/2011  . Hypertension 12/15/2010  . Chest pain 12/15/2010  . Exertional dyspnea 12/15/2010  . Leukocytoclastic vasculitis ([place]) 03/24/2007    Past Surgical History:  Procedure Laterality Date  . ANKLE SURGERY     right  . BONE MARROW ASPIRATION    . CARDIAC CATHETERIZATION    . CARDIAC CATHETERIZATION N/A 04/23/2015   Procedure: Right Heart Cath;  Surgeon: [name], MD;  Location: [facility];  Service: Cardiovascular;  Laterality: N/A;  . I&D EXTREMITY  08/09/2011   Procedure: IRRIGATION AND DEBRIDEMENT EXTREMITY;  Surgeon: [name], MD;  Location: [facility];  Service: General;  Laterality: Left;  i & D Left axilla abscess  . LYMPH NODE BIOPSY N/A 04/05/2013   Procedure: EXCISIONAL BIOPSY RIGHT SUBMANDIBULAR NODE, NASAL ENDOSCOPIC WITH BIOPSY NASAL PHARYNX;  Surgeon: [name], MD;  Location: [facility];  Service: ENT;  Laterality: N/A;     OB History    Gravida  4   Para  2   Term  2   Preterm      AB  2   Living  2     SAB      TAB  2   Ectopic      Multiple      Live Births               Home Medications    Prior to Admission medications   Medication Sig Start Date End Date Taking? Authorizing Provider  albuterol (PROVENTIL HFA;VENTOLIN HFA) 108 (90 Base) MCG/ACT inhaler Inhale 2 puffs into the lungs every 6 (six) hours as needed for wheezing or shortness of breath. Patient not taking: Reported on 11/10/2017 12/06/15   Tresa Garter, MD  amLODipine (NORVASC) 10 MG tablet Take 1 tablet  (10 mg total) by mouth daily. 08/01/14   Burtis Junes, NP  aspirin EC 325 MG tablet Take 325 mg by mouth daily.    [provider]  atorvastatin (LIPITOR) 40 MG tablet TAKE 1 TABLET BY MOUTH IN THE EVENING 02/18/15   [name], MD  buPROPion [facility] SR) 150 MG 12 hr tablet Take 150 mg by mouth 2 (two) times daily.    [provider]  citalopram (CELEXA) 40 MG tablet Take 1 tablet (40 mg total) by mouth daily. 08/25/13   Tresa Garter, MD  cyanocobalamin 500 MCG tablet Take 500 mcg by mouth daily.    [provider]  DULERA 100-5 MCG/ACT AERO Inhale 2 puffs into the lungs 2 (two) times daily. 02/10/16   [provider]  ferrous sulfate 325 (65 FE) MG tablet  Take 650 mg by mouth daily with breakfast.    [provider]  fluticasone (FLONASE) 50 MCG/ACT nasal spray Place 2 sprays into both nostrils daily. Patient not taking: Reported on 03/17/2016 05/08/15   Elsie Stain, MD  furosemide (LASIX) 20 MG tablet Take 2 tablets (40 mg total) by mouth 2 (two) times daily. Patient taking differently: Take 20-40 mg by mouth 2 (two) times daily. Take 2 tablets every morning and 1 tablet every evening 04/18/15   [name], MD  hydrOXYzine (VISTARIL) 25 MG capsule Take 1 capsule by mouth 4 (four) times daily as needed for anxiety. 12/06/15   [provider]  lisinopril (PRINIVIL,ZESTRIL) 10 MG tablet Take 1 tablet (10 mg total) by mouth daily. 11/10/17   Argentina Donovan, PA-C  loratadine (CLARITIN) 10 MG tablet Take 1 tablet (10 mg total) by mouth daily. 09/20/13   Lance Bosch, NP  Multiple Vitamin (MULTI-VITAMINS) TABS Take 1 tablet by mouth daily.    [provider]  polyethylene glycol powder (GLYCOLAX/MIRALAX) powder Take 17 g by mouth daily as needed (constipation).     [provider]  ranitidine (ZANTAC) 150 MG tablet Take 1 tablet (150 mg total) by mouth 2 (two) times daily. 08/21/15   Maren Reamer,  MD  topiramate (TOPAMAX) 25 MG tablet Take 2 tablets at night 03/13/15   Cameron Sprang, MD  traZODone (DESYREL) 150 MG tablet Take 150 mg by mouth at bedtime. 01/07/16   [provider]    Family History Family History  Problem Relation Age of Onset  . Coronary artery disease Mother   . Diabetes Mother   . Diabetes Father   . Hypertension Father   . Coronary artery disease Father   . Heart attack Father   . Breast cancer Maternal Grandmother   . Diabetes Maternal Grandmother   . Heart attack Maternal Grandmother   . Stroke Maternal Grandmother     Social History Social History   Tobacco Use  . Smoking status: Current Every Day Smoker    Packs/day: 0.00    Years: 30.00    Pack years: 0.00    Types: Cigarettes  . Smokeless tobacco: Never Used  Substance Use Topics  . Alcohol use: No    Alcohol/week: 0.0 standard drinks    Comment: pint of wine 2-3 times a week--ocassional - quit 2012  . Drug use: No    Frequency: 1.0 times per week    Types: "Crack" cocaine    Comment: none since 02/04/14     Allergies   Ciprofloxacin   Review of Systems Review of Systems  Constitutional: Negative for chills and fever.  HENT: Negative for ear pain and sore throat.   Eyes: Negative for pain and visual disturbance.  Respiratory: Positive for shortness of breath. Negative for cough, sputum production and wheezing.   Cardiovascular: Positive for orthopnea. Negative for chest pain, palpitations, leg swelling and PND.  Gastrointestinal: Negative for abdominal pain and vomiting.  Genitourinary: Negative for dysuria and hematuria.  Musculoskeletal: Negative for arthralgias and back pain.  Skin: Negative for color change and rash.  Neurological: Negative for seizures and syncope.  All other systems reviewed and are negative.    Physical Exam Updated Vital Signs  ED Triage Vitals [03/08/18 1749]  Enc Vitals Group     BP (!) 193/111     Pulse Rate (!) 125     Resp (!)  23     Temp 97.7 F (36.5  C)     Temp Source Oral     SpO2 92 %     Weight      Height      Head Circumference      Peak Flow      Pain Score 0     Pain Loc      Pain Edu?      Excl. in [place]?     Physical Exam Vitals signs and nursing note reviewed.  Constitutional:      General: She is not in acute distress.    Appearance: She is well-developed.  HENT:     Head: Normocephalic and atraumatic.     Mouth/Throat:     Mouth: Mucous membranes are moist.     Pharynx: Oropharynx is clear.  Eyes:     Extraocular Movements: Extraocular movements intact.     Conjunctiva/sclera: Conjunctivae normal.     Pupils: Pupils are equal, round, and reactive to light.  Neck:     Musculoskeletal: Normal range of motion and neck supple.  Cardiovascular:     Rate and Rhythm: Tachycardia present. Rhythm irregular.     Heart sounds: No murmur.  Pulmonary:     Effort: Pulmonary effort is normal. Tachypnea present. No respiratory distress.     Breath sounds: Rales present. No decreased breath sounds, wheezing or rhonchi.  Abdominal:     Palpations: Abdomen is soft.     Tenderness: There is no abdominal tenderness.  Musculoskeletal: Normal range of motion.     Right lower leg: No edema.     Left lower leg: No edema.  Skin:    General: Skin is warm and dry.     Capillary Refill: Capillary refill takes less than 2 seconds.  Neurological:     General: No focal deficit present.     Mental Status: She is alert.  Psychiatric:        Mood and Affect: Mood normal.      ED Treatments / Results  Labs (all labs ordered are listed, but only abnormal results are displayed) Labs Reviewed  BASIC METABOLIC PANEL - Abnormal; Notable for the following components:      Result Value   CO2 19 (*)    Glucose, Bld 115 (*)    BUN 22 (*)    Creatinine, Ser 1.18 (*)    GFR calc non Af Amer 52 (*)    All other components within normal limits  CBC - Abnormal; Notable for the following components:   RBC 5.22  (*)    MCV 79.3 (*)    MCH 23.8 (*)    RDW 18.8 (*)    All other components within normal limits  D-DIMER, QUANTITATIVE (NOT AT [facility]) - Abnormal; Notable for the following components:   D-Dimer, Quant 2.00 (*)    All other components within normal limits  BRAIN NATRIURETIC PEPTIDE - Abnormal; Notable for the following components:   B Natriuretic Peptide 497.1 (*)    All other components within normal limits  RAPID URINE DRUG SCREEN, HOSP PERFORMED  HIV ANTIBODY (ROUTINE TESTING W REFLEX)  BASIC METABOLIC PANEL  TSH  MAGNESIUM  I-STAT TROPONIN, ED    EKG EKG Interpretation  Date/Time:  Tuesday March 08 2018 17:48:09 EST Ventricular Rate:  132 PR Interval:    QRS Duration: 80 QT Interval:  330 QTC Calculation: 489 R Axis:   58 Text Interpretation:  atrial tachycardia LVH with secondary repolarization abnormality Borderline prolonged QT interval Confirmed by Lennice Sites [phone])  on 03/08/2018 6:05:17 PM   Radiology Dg Chest 2 View  Result Date: 03/08/2018 CLINICAL DATA:  52 y/o  F; 1 week of shortness of breath. EXAM: CHEST - 2 VIEW COMPARISON:  06/15/2017 chest radiograph FINDINGS: Stable cardiomegaly given projection and technique. Calcific aortic atherosclerosis. Diffuse reticular and patchy opacities of the lungs with basilar predominance. Small bilateral pleural effusions packing along fissures. No pneumothorax. Bones are unremarkable. IMPRESSION: Cardiomegaly with interstitial and alveolar pulmonary edema. Small bilateral effusions. Electronically Signed   By: Kristine Garbe M.D.   On: 03/08/2018 19:35   Ct Angio Chest Pe W And/or Wo Contrast  Result Date: 03/08/2018 CLINICAL DATA:  Dyspnea x1 week EXAM: CT ANGIOGRAPHY CHEST WITH CONTRAST TECHNIQUE: Multidetector CT imaging of the chest was performed using the standard protocol during bolus administration of intravenous contrast. Multiplanar CT image reconstructions and MIPs were obtained to evaluate the  vascular anatomy. CONTRAST:  110mL ISOVUE-370 IOPAMIDOL (ISOVUE-370) INJECTION 76% COMPARISON:  CXR from earlier on the same day and 06/15/2017, chest CT 04/12/2014 FINDINGS: Cardiovascular: Stable cardiomegaly without pericardial effusion or thickening. Satisfactory opacification of the pulmonary arteries to the segmental level without acute pulmonary embolus. Ectatic thoracic aorta to 3.8 cm along the ascending portion. Conventional branch pattern of the great vessels with minimal atherosclerosis. Scattered coronary arteriosclerosis is identified. Mediastinum/Nodes: Midline patent trachea and mainstem bronchi. No pathologically enlarged mediastinal, hilar, axillary or supraclavicular nodes. No thyromegaly or visualized mass. Lungs/Pleura: Small to moderate right effusion with adjacent atelectasis. Minimal atelectasis at the left lung base. Central vascular congestion mild ground-glass opacities noted throughout both aerated lungs compatible with stigmata of CHF. Streaky subsegmental atelectasis and/or scarring is seen at each base and along the fissures. Upper Abdomen: Reflux of contrast into the hepatic veins compatible with a component of right heart failure. Musculoskeletal: No chest wall abnormality. No acute or significant osseous findings. Review of the MIP images confirms the above findings. IMPRESSION: 1. No acute pulmonary embolus. 2. Small to moderate right pleural effusion with adjacent atelectasis. 3. Cardiomegaly with diffuse ground-glass opacities of the lungs bilaterally. This in conjunction with the pleural effusions would be in keeping with mild CHF. Aortic Atherosclerosis (ICD10-I70.0). Electronically Signed   By: Ashley Royalty M.D.   On: 03/08/2018 21:11    Procedures .Critical Care Performed by: Lennice Sites, DO Authorized by: Lennice Sites, DO   Critical care provider statement:    Critical care time (minutes):  8   Critical care was necessary to treat or prevent imminent or  life-threatening deterioration of the following conditions:  Circulatory failure and cardiac failure (Afib with RVR)   Critical care was time spent personally by me on the following activities:  Development of treatment plan with patient or surrogate, discussions with primary provider, evaluation of patient's response to treatment, examination of patient, obtaining history from patient or surrogate, ordering and review of radiographic studies, ordering and review of laboratory studies, ordering and performing treatments and interventions, pulse oximetry, re-evaluation of patient's condition and review of old charts   I assumed direction of critical care for this patient from another provider in my specialty: no     (including critical care time)  Medications Ordered in ED Medications  iopamidol (ISOVUE-370) 76 % injection (has no administration in time range)  aspirin EC tablet 325 mg (has no administration in time range)  amLODipine (NORVASC) tablet 10 mg (has no administration in time range)  atorvastatin (LIPITOR) tablet 40 mg (has no administration in time range)  lisinopril (PRINIVIL,ZESTRIL) tablet  10 mg (has no administration in time range)  buPROPion (WELLBUTRIN SR) 12 hr tablet 150 mg (has no administration in time range)  citalopram (CELEXA) tablet 40 mg (has no administration in time range)  hydrOXYzine (ATARAX/VISTARIL) tablet 25 mg (has no administration in time range)  traZODone (DESYREL) tablet 150 mg (150 mg Oral Given 03/08/18 2337)  famotidine (PEPCID) tablet 20 mg (20 mg Oral Given 03/08/18 2338)  topiramate (TOPAMAX) tablet 50 mg (has no administration in time range)  mometasone-formoterol (DULERA) 100-5 MCG/ACT inhaler 2 puff (has no administration in time range)  sodium chloride flush (NS) 0.9 % injection 3 mL (3 mLs Intravenous Given 03/08/18 2342)  sodium chloride flush (NS) 0.9 % injection 3 mL (has no administration in time range)  0.9 %  sodium chloride infusion (has  no administration in time range)  acetaminophen (TYLENOL) tablet 650 mg (has no administration in time range)  ondansetron (ZOFRAN) injection 4 mg (has no administration in time range)  enoxaparin (LOVENOX) injection 40 mg (has no administration in time range)  furosemide (LASIX) injection 40 mg (has no administration in time range)  diltiazem (CARDIZEM) 100 mg in dextrose 5% 188mL (1 mg/mL) infusion (12.5 mg/hr Intravenous Rate/Dose Change 03/08/18 2306)  hydrALAZINE (APRESOLINE) injection 10 mg (has no administration in time range)  diltiazem (CARDIZEM) 1 mg/mL load via infusion 10 mg (10 mg Intravenous Bolus from Bag 03/08/18 1939)  iopamidol (ISOVUE-370) 76 % injection 100 mL (100 mLs Intravenous Contrast Given 03/08/18 2022)  furosemide (LASIX) injection 20 mg (20 mg Intravenous Given 03/08/18 2158)     Initial Impression / Assessment and Plan / ED Course  I have reviewed the triage vital signs and the nursing notes.  Pertinent labs & imaging results that were available during my care of the patient were reviewed by me and considered in my medical decision making (see chart for details).     Morgan Bean is a 52 year old female history of atrial fibrillation status post ablation, hypertension, coronary artery disease, heart failure who presents to the ED with shortness of breath.  Patient with tachycardia upon arrival, hypertension but otherwise normal vitals.  No fever.  Patient with symptoms over the last week.  She has rales on exam mildly.  No obvious swelling of the legs.  Patient with no chest pain.  She does admit to history of cocaine use but has not used any in the last week.  She states that she is not compliant with her medications.  She is no longer on any arrhythmia medications.  She is not on a blood thinner.  Denies history of clot.  Will get EKG, troponin, BNP, d-dimer, basic labs.  Patient with EKG that shows atrial fibrillation/atrial flutter with rapid ventricular rate.   Will give IV diltiazem, IV diltiazem bolus.  Concern for possible volume overload, heart failure, PE.  Patient with elevated d-dimer.  PE study was obtained that showed no acute PE but did show some edema.  Chest x-ray also showed edema.  No pneumonia.  No within normal limit.  BNP elevated.  Patient otherwise no significant anemia, electrolyte abnormality.  Patient likely with atrial fibrillation/atrial flutter with rapid ventricular rate causing heart failure also due to noncompliance with blood pressure medications.  Patient given IV Lasix as well for volume overload.  Patient admitted to hospitalist service for further care.  Hemodynamically improved following IV diltiazem drip.  This chart was dictated using voice recognition software.  Despite best efforts to proofread,  errors can occur which  can change the documentation meaning.   Final Clinical Impressions(s) / ED Diagnoses   Final diagnoses:  Atrial fibrillation with RVR ([place])  Acute pulmonary edema [facility])    ED Discharge Orders    None       Lennice Sites, DO 03/09/18 0000

## 2018-03-08 NOTE — H&P (Signed)
History and Physical    Kimbra Marcelino NGE:[id number] DOB: 03-09-1966 DOA: 03/08/2018  PCP: Patient, No Pcp Per   Patient coming from: Home   Chief Complaint: SOB, orthopnea   HPI: Morgan Bean is a 52 y.o. female with medical history significant for polysubstance abuse, depression with anxiety, chronic diastolic CHF, pulmonary hypertension, coronary artery disease, history of CVA, hypertension, and COPD, now presenting to the emergency department for evaluation of shortness of breath and orthopnea.  The patient reports that the symptoms developed over the past week and have been worsening.  She denies any fevers or chills and denies any significant cough.  She denies lower extremity swelling or tenderness.  She acknowledges ongoing cocaine use and has missed some doses of her diuretic recently.  She denies chest pain but reports occasional palpitations.  ED Course: Upon arrival to the ED, patient is found to be afebrile, saturating mid 70s on room air, tachypneic in the upper 30s, tachycardic to 160 initially, and hypertensive to 190/110.  EKG features in atrial tachycardia with rate 132, LVH, and repolarization abnormality.  Chest x-ray is notable for cardiomegaly with interstitial and alveolar pulmonary edema.  CTA chest is negative for PE, but notable for small to moderate right pleural effusion, cardiomegaly, and diffuse groundglass opacities concerning for CHF.  Chemistry panel is notable for a creatinine of 1.18, similar to priors.  CBC is notable for a mild microcytosis without anemia.  Troponin is normal and BNP is elevated 497.  Patient was given bolus of IV diltiazem and started on diltiazem infusion in the ED.  She was also given 20 mg of IV Lasix and cardiology was consulted by the ED physician.  Blood pressure and heart rate have improved with diltiazem, patient is saturating adequately on nasal cannula and not in acute respiratory distress.  She will be observed for further evaluation and  management.  Review of Systems:  All other systems reviewed and apart from HPI, are negative.  Past Medical History:  Diagnosis Date  . Anemia of other chronic disease 11/13/2013  . Anginal pain ([place])    none in past year  . Anxiety   . Asthma   . Candidiasis 06/23/2013  . Chronic diastolic CHF (congestive heart failure) ([place])   . Constipation   . Coronary artery disease    Lexiscan myoview (10/12) with significant ST depression upon Lexiscan injection but no ischemia or infarction on perfusion images. Left heart cath (10/12): 30% mid LAD, 30% ostial D1, 50% ostial D2.    [name] Cough 11/13/2013  . DCIS (ductal carcinoma in situ) of breast   . Depression   . Headache 02/22/2014  . Hx of cardiovascular stress test    Lexiscan Myoview (2/16): Breast attenuation artifact, no ischemia, EF 64%; low risk  . Hyperlipemia   . Hypertension   . Iron deficiency    hx of  . Leukocytoclastic vasculitis ([place])    Rash across lower body, occurred in 9/11, diagnosed by skin biopsy. ANA positive. Thought to be secondary to cocaine use.  . Leukopenia 03/21/2013  . Lymphadenopathy 03/21/2013  . Lymphadenopathy 01/30/2014  . Mental disorder   . Pancytopenia ([place])   . Pancytopenia, acquired ([place]) 07/16/2015  . Polysubstance abuse ([place])    Prior cocaine  . Pulmonary HTN ([place])    Echo (9/11) with EF 65%, mild LVH, mild AI, mild MR, moderate to possibly severe TR with PA systolic pressure 52 mmHg. Echo (10/12): severe LV hypertrophy, EF 60-65%, mild MR, mild AI, moderate  to severe tricuspid regurgitation, PA systolic pressure 38 mmHg.  Echo 4/14: moderate LVH, EF 65%, normal wall motion, diastolic dysfunction, mild AI, mild MR  . Shortness of breath    a. PFTs 5/14:  FEV1/FVC 99% predicted; FVC 60% predicted; DLCO mildly reduced, mild restriction, air trapping.;  b.  seen by pulmo (Dr. [name]) 5/14: mainly upper airway symptoms - ACE d/c'd (sx's better)  . Stroke ([place]) 2010ish  . Syphilis 04/25/2013  . Tobacco  abuse   . Tricuspid regurgitation   . Unspecified deficiency anemia 03/29/2013    Past Surgical History:  Procedure Laterality Date  . ANKLE SURGERY     right  . BONE MARROW ASPIRATION    . CARDIAC CATHETERIZATION    . CARDIAC CATHETERIZATION N/A 04/23/2015   Procedure: Right Heart Cath;  Surgeon: [name], MD;  Location: [facility];  Service: Cardiovascular;  Laterality: N/A;  . I&D EXTREMITY  08/09/2011   Procedure: IRRIGATION AND DEBRIDEMENT EXTREMITY;  Surgeon: [name], MD;  Location: [facility];  Service: General;  Laterality: Left;  i & D Left axilla abscess  . LYMPH NODE BIOPSY N/A 04/05/2013   Procedure: EXCISIONAL BIOPSY RIGHT SUBMANDIBULAR NODE, NASAL ENDOSCOPIC WITH BIOPSY NASAL PHARYNX;  Surgeon: [name], MD;  Location: [facility];  Service: ENT;  Laterality: N/A;     reports that she has been smoking cigarettes. She has been smoking about 0.00 packs per day for the past 30.00 years. She has never used smokeless tobacco. She reports that she does not drink alcohol or use drugs.  Allergies  Allergen Reactions  . Ciprofloxacin Itching    Family History  Problem Relation Age of Onset  . Coronary artery disease Mother   . Diabetes Mother   . Diabetes Father   . Hypertension Father   . Coronary artery disease Father   . Heart attack Father   . Breast cancer Maternal Grandmother   . Diabetes Maternal Grandmother   . Heart attack Maternal Grandmother   . Stroke Maternal Grandmother      Prior to Admission medications   Medication Sig Start Date End Date Taking? Authorizing Provider  albuterol (PROVENTIL HFA;VENTOLIN HFA) 108 (90 Base) MCG/ACT inhaler Inhale 2 puffs into the lungs every 6 (six) hours as needed for wheezing or shortness of breath. Patient not taking: Reported on 11/10/2017 12/06/15   Tresa Garter, MD  amLODipine (NORVASC) 10 MG tablet Take 1 tablet (10 mg total) by mouth daily. 08/01/14   Burtis Junes, NP  aspirin EC 325 MG  tablet Take 325 mg by mouth daily.    [provider]  atorvastatin (LIPITOR) 40 MG tablet TAKE 1 TABLET BY MOUTH IN THE EVENING 02/18/15   [name], MD  buPROPion [facility] SR) 150 MG 12 hr tablet Take 150 mg by mouth 2 (two) times daily.    [provider]  citalopram (CELEXA) 40 MG tablet Take 1 tablet (40 mg total) by mouth daily. 08/25/13   Tresa Garter, MD  cyanocobalamin 500 MCG tablet Take 500 mcg by mouth daily.    [provider]  DULERA 100-5 MCG/ACT AERO Inhale 2 puffs into the lungs 2 (two) times daily. 02/10/16   [provider]  ferrous sulfate 325 (65 FE) MG tablet Take 650 mg by mouth daily with breakfast.    [provider]  fluticasone (FLONASE) 50 MCG/ACT nasal spray Place 2 sprays into both nostrils daily. Patient not taking: Reported on 03/17/2016  05/08/15   Elsie Stain, MD  furosemide (LASIX) 20 MG tablet Take 2 tablets (40 mg total) by mouth 2 (two) times daily. Patient taking differently: Take 20-40 mg by mouth 2 (two) times daily. Take 2 tablets every morning and 1 tablet every evening 04/18/15   [name], MD  hydrOXYzine (VISTARIL) 25 MG capsule Take 1 capsule by mouth 4 (four) times daily as needed for anxiety. 12/06/15   [provider]  lisinopril (PRINIVIL,ZESTRIL) 10 MG tablet Take 1 tablet (10 mg total) by mouth daily. 11/10/17   Argentina Donovan, PA-C  loratadine (CLARITIN) 10 MG tablet Take 1 tablet (10 mg total) by mouth daily. 09/20/13   Lance Bosch, NP  Multiple Vitamin (MULTI-VITAMINS) TABS Take 1 tablet by mouth daily.    [provider]  polyethylene glycol powder (GLYCOLAX/MIRALAX) powder Take 17 g by mouth daily as needed (constipation).     [provider]  ranitidine (ZANTAC) 150 MG tablet Take 1 tablet (150 mg total) by mouth 2 (two) times daily. 08/21/15   Maren Reamer, MD  topiramate (TOPAMAX) 25 MG tablet Take 2 tablets at night 03/13/15   Cameron Sprang, MD  traZODone (DESYREL) 150 MG tablet Take 150 mg by mouth at bedtime. 01/07/16   [provider]    Physical Exam: Vitals:   03/08/18 2115 03/08/18 2130 03/08/18 2200 03/08/18 2215  BP: (!) 179/121 (!) 176/121  (!) 155/126  Pulse: 95 (!) 122 (!) 104 100  Resp: (!) 32 20    Temp:      TempSrc:      SpO2: 100% 96%  97%    Constitutional: NAD, calm  Eyes: PERTLA, lids and conjunctivae normal ENMT: Mucous membranes are moist. Posterior pharynx clear of any exudate or lesions.   Neck: normal, supple, no masses, no thyromegaly Respiratory: Rales bilaterally, no wheezing. Dyspneic with speech. Tachypneic. No pallor or cyanosis.  Cardiovascular: Rate ~120 and irregualr. No extremity edema.  Abdomen: No distension, no tenderness, soft. Bowel sounds normal.  Musculoskeletal: no clubbing / cyanosis. No joint deformity upper and lower extremities.    Skin: no significant rashes, lesions, ulcers. Warm, dry, well-perfused. Neurologic: No facial asymmetry. Sensation intact. Moving all extremities.  Psychiatric: Alert and oriented to person, place, and situation. Pleasant, cooperative.    Labs on Admission: I have personally reviewed following labs and imaging studies  CBC: Recent Labs  Lab 03/08/18 1745  WBC 5.8  HGB 12.4  HCT 41.4  MCV 79.3*  PLT 568   Basic Metabolic Panel: Recent Labs  Lab 03/08/18 1745  NA 138  K 3.9  CL 111  CO2 19*  GLUCOSE 115*  BUN 22*  CREATININE 1.18*  CALCIUM 9.2   GFR: CrCl cannot be calculated (Unknown ideal weight.). Liver Function Tests: No results for input(s): AST, ALT, ALKPHOS, BILITOT, PROT, ALBUMIN in the last 168 hours. No results for input(s): LIPASE, AMYLASE in the last 168 hours. No results for input(s): AMMONIA in the last 168 hours. Coagulation Profile: No results for input(s): INR, PROTIME in the last 168 hours. Cardiac Enzymes: No results for input(s): CKTOTAL, CKMB, CKMBINDEX, TROPONINI in the last 168  hours. BNP (last 3 results) No results for input(s): PROBNP in the last 8760 hours. HbA1C: No results for input(s): HGBA1C in the last 72 hours. CBG: No results for input(s): GLUCAP in the last 168 hours. Lipid Profile: No results for input(s): CHOL, HDL, LDLCALC, TRIG, CHOLHDL, LDLDIRECT in the last 72 hours.  Thyroid Function Tests: No results for input(s): TSH, T4TOTAL, FREET4, T3FREE, THYROIDAB in the last 72 hours. Anemia Panel: No results for input(s): VITAMINB12, FOLATE, FERRITIN, TIBC, IRON, RETICCTPCT in the last 72 hours. Urine analysis:    Component Value Date/Time   COLORURINE YELLOW 09/20/2013 1805   APPEARANCEUR CLEAR 09/20/2013 1805   LABSPEC 1.022 09/20/2013 1805   PHURINE 5.5 09/20/2013 1805   GLUCOSEU NEG 09/20/2013 1805   HGBUR NEG 09/20/2013 1805   BILIRUBINUR NEG 04/23/2014 1245   KETONESUR NEG 09/20/2013 1805   PROTEINUR NEG 04/23/2014 1245   PROTEINUR NEG 09/20/2013 1805   UROBILINOGEN 0.2 04/23/2014 1245   UROBILINOGEN 0.2 09/20/2013 1805   NITRITE NEG 04/23/2014 1245   NITRITE NEG 09/20/2013 1805   LEUKOCYTESUR Negative 04/23/2014 1245   Sepsis Labs: @LABRCNTIP (procalcitonin:4,lacticidven:4) )No results found for this or any previous visit (from the past 240 hour(s)).   Radiological Exams on Admission: Dg Chest 2 View  Result Date: 03/08/2018 CLINICAL DATA:  52 y/o  F; 1 week of shortness of breath. EXAM: CHEST - 2 VIEW COMPARISON:  06/15/2017 chest radiograph FINDINGS: Stable cardiomegaly given projection and technique. Calcific aortic atherosclerosis. Diffuse reticular and patchy opacities of the lungs with basilar predominance. Small bilateral pleural effusions packing along fissures. No pneumothorax. Bones are unremarkable. IMPRESSION: Cardiomegaly with interstitial and alveolar pulmonary edema. Small bilateral effusions. Electronically Signed   By: Kristine Garbe M.D.   On: 03/08/2018 19:35   Ct Angio Chest Pe W And/or Wo  Contrast  Result Date: 03/08/2018 CLINICAL DATA:  Dyspnea x1 week EXAM: CT ANGIOGRAPHY CHEST WITH CONTRAST TECHNIQUE: Multidetector CT imaging of the chest was performed using the standard protocol during bolus administration of intravenous contrast. Multiplanar CT image reconstructions and MIPs were obtained to evaluate the vascular anatomy. CONTRAST:  128mL ISOVUE-370 IOPAMIDOL (ISOVUE-370) INJECTION 76% COMPARISON:  CXR from earlier on the same day and 06/15/2017, chest CT 04/12/2014 FINDINGS: Cardiovascular: Stable cardiomegaly without pericardial effusion or thickening. Satisfactory opacification of the pulmonary arteries to the segmental level without acute pulmonary embolus. Ectatic thoracic aorta to 3.8 cm along the ascending portion. Conventional branch pattern of the great vessels with minimal atherosclerosis. Scattered coronary arteriosclerosis is identified. Mediastinum/Nodes: Midline patent trachea and mainstem bronchi. No pathologically enlarged mediastinal, hilar, axillary or supraclavicular nodes. No thyromegaly or visualized mass. Lungs/Pleura: Small to moderate right effusion with adjacent atelectasis. Minimal atelectasis at the left lung base. Central vascular congestion mild ground-glass opacities noted throughout both aerated lungs compatible with stigmata of CHF. Streaky subsegmental atelectasis and/or scarring is seen at each base and along the fissures. Upper Abdomen: Reflux of contrast into the hepatic veins compatible with a component of right heart failure. Musculoskeletal: No chest wall abnormality. No acute or significant osseous findings. Review of the MIP images confirms the above findings. IMPRESSION: 1. No acute pulmonary embolus. 2. Small to moderate right pleural effusion with adjacent atelectasis. 3. Cardiomegaly with diffuse ground-glass opacities of the lungs bilaterally. This in conjunction with the pleural effusions would be in keeping with mild CHF. Aortic Atherosclerosis  (ICD10-I70.0). Electronically Signed   By: Ashley Royalty M.D.   On: 03/08/2018 21:11    EKG: Independently reviewed. Atrial tachycardia (rate 132), LVH with repolarization abnormality.   Assessment/Plan   1. Acute on chronic diastolic CHF; acute hypoxic respiratory failure  - Presents with several days of worsening SOB and orthopnea, denies fever/chills or cough  - Saturating mid 70's on rm air in ED  - CTA negative for PE, but findings consistent  with acute CHF  - Possibly rate-related  - Continue diuresis with Lasix 40 mg IV q12h, continue rate-control with diltiazem, continue ACE, follow daily wt and I/O's, update echo    2. Atrial tachyarrhythmia  - Presents with several days of progressive SOB and orthopnea, found to be in atrial tachycardia with rate 160 initially; reports hx of a fib/flutter s/p ablation  - Treated with diltiazem bolus in ED and started on diltiazem infusion with good response  - Possibly precipitated by cocaine use, though she reports last use was a wk ago  - No chest pain or cardiac enzyme elevation to suggest ischemic etiology  - Continue cardiac monitoring, titrate diltiazem infusion, check TSH and mag level    3. Polysubstance abuse  - Acknowledges ongoing cocaine abuse  - Counseled toward cessation   4. Hypertension with hypertensive urgency  - BP 190/110 in ED, improving with diltiazem infusion  - Continue Norvasc and lisinopril      5. Depression; anxiety; insomnia  - Stable  - Continue Wellbutrin, Celexa, as-needed Vistaril, trazodone    6. History of CVA  - Denies any new deficits  - Continue ASA and statin    7. COPD  - Stable, continue ICS/LABA     DVT prophylaxis: Lovenox  Code Status: Full  Family Communication: Discussed with patient  Consults called: Cardiology consulted by ED physician  Admission status: Observation    Vianne Bulls, MD [facility] Pager [phone]  If 7PM-7AM, please contact  night-coverage [email or web address] Password [facility]  03/08/2018, 10:32 PM

## 2018-03-08 NOTE — ED Notes (Signed)
Patient transported to X-ray 

## 2018-03-08 NOTE — ED Triage Notes (Signed)
Pt here for short of breath x 1 week. Pt states today is worse. Pt use to be on breathing tx and inhaler but states "they took me off of it" pt denies any recent drug use, smoking or vapeing. Pt states she cant sleep or lie flat. Pt states she has missed doses of her fluid pill

## 2018-03-09 ENCOUNTER — Observation Stay (HOSPITAL_BASED_OUTPATIENT_CLINIC_OR_DEPARTMENT_OTHER): Payer: Medicaid Other

## 2018-03-09 DIAGNOSIS — I340 Nonrheumatic mitral (valve) insufficiency: Secondary | ICD-10-CM

## 2018-03-09 DIAGNOSIS — I351 Nonrheumatic aortic (valve) insufficiency: Secondary | ICD-10-CM

## 2018-03-09 DIAGNOSIS — I370 Nonrheumatic pulmonary valve stenosis: Secondary | ICD-10-CM

## 2018-03-09 LAB — BASIC METABOLIC PANEL
Anion gap: 8 (ref 5–15)
BUN: 20 mg/dL (ref 6–20)
CO2: 20 mmol/L — ABNORMAL LOW (ref 22–32)
Calcium: 8.6 mg/dL — ABNORMAL LOW (ref 8.9–10.3)
Chloride: 110 mmol/L (ref 98–111)
Creatinine, Ser: 1.26 mg/dL — ABNORMAL HIGH (ref 0.44–1.00)
GFR calc Af Amer: 56 mL/min — ABNORMAL LOW (ref >60)
GFR calc non Af Amer: 48 mL/min — ABNORMAL LOW (ref >60)
Glucose, Bld: 94 mg/dL (ref 70–99)
Potassium: 3.3 mmol/L — ABNORMAL LOW (ref 3.5–5.1)
SODIUM: 138 mmol/L (ref 135–145)

## 2018-03-09 LAB — TSH: TSH: 2.563 u[IU]/mL (ref 0.350–4.500)

## 2018-03-09 LAB — ECHOCARDIOGRAM COMPLETE
Height: 66 in
WEIGHTICAEL: 2665.59 [oz_av]

## 2018-03-09 LAB — MAGNESIUM: Magnesium: 2.2 mg/dL (ref 1.7–2.4)

## 2018-03-09 LAB — HIV ANTIBODY (ROUTINE TESTING W REFLEX): HIV Screen 4th Generation wRfx: NONREACTIVE

## 2018-03-09 MED ORDER — POTASSIUM CHLORIDE CRYS ER 20 MEQ PO TBCR
40.0000 meq | EXTENDED_RELEASE_TABLET | Freq: Once | ORAL | Status: AC
  Administered 2018-03-09: 40 meq via ORAL
  Filled 2018-03-09: qty 2

## 2018-03-09 NOTE — Progress Notes (Signed)
PROGRESS NOTE    Morgan Bean  BJS:[id number] DOB: March 25, 1965 DOA: 03/08/2018 PCP: Patient, No Pcp Per    Brief Narrative:Morgan Bean is a 52 y.o. female with medical history significant for polysubstance abuse, depression with anxiety, chronic diastolic CHF, pulmonary hypertension, coronary artery disease, history of CVA, hypertension, and COPD, now presenting to the emergency department for evaluation of shortness of breath and orthopnea.  The patient reports that the symptoms developed over the past week and have been worsening.Chest x-ray is notable for cardiomegaly with interstitial and alveolar pulmonary edema.  Assessment & Plan:   Principal Problem:   Acute on chronic diastolic CHF (congestive heart failure) ([name]) Active Problems:   Major depressive disorder, recurrent episode with psychotic features.   History of cocaine abuse ([place])   Polysubstance abuse ([place])   History of stroke   COPD with chronic bronchitis ([place])   Hypertensive urgency   Atrial tachycardia ([name])   Acute respiratory failure with hypoxia ([name])   Acute respiratory failure with hypoxia secondary to acute on chronic diastolic heart failure Admitted for IV diuresis with IV Lasix 40 mg every 12 hours.  Monitor urine output and strict intake and output and daily weights. Watch renal parameters while on IV Lasix. CTA negative for PE but findings consistent with acute CHF. Echocardiogram ordered and pending.   Atrial tachycardia Patient was started on IV Cardizem gtt. and transitioned off.  TSH within normal limits.  However possibly precipitated by cocaine use. Opponents negative at this time. Patient denies any chest pain.    History of polysubstance abuse  counseled towards cessation Will need social worker consult for resources.   Hypertensive urgency Resolved    Depression, anxiety and insomnia Continue with Wellbutrin and Celexa and trazodone.    History of nonhemorrhagic CVA Continue  with aspirin and statin.   History of COPD Stable no wheezing heard on exam.   Hypokalemia Replaced.    DVT prophylaxis: Lovenox Code Status: Full code Family Communication: Cussed with family at bedside Disposition Plan: Pending clinical improvement   Consultants:   Cardiology will be consulted in the morning   Procedures: Echocardiogram  Antimicrobials: None  Subjective: Patient reports being short of breath on walking  Objective: Vitals:   03/09/18 0814 03/09/18 0943 03/09/18 1230 03/09/18 1518  BP:  111/85 90/79 113/86  Pulse:   75 84  Resp:   16 18  Temp:   97.7 F (36.5 C) 97.7 F (36.5 C)  TempSrc:   Oral Oral  SpO2: 94%  98% 99%  Weight:      Height:        Intake/Output Summary (Last 24 hours) at 03/09/2018 1832 Last data filed at 03/09/2018 1830 Gross per 24 hour  Intake 1086.68 ml  Output 3250 ml  Net -2163.32 ml   Filed Weights   03/08/18 2255 03/08/18 2308  Weight: 75.6 kg 75.6 kg    Examination:  General exam: Distress from shortness of breath Respiratory system: Rales present, diminished at bases Cardiovascular system: S1 & S2 heard, RRR. No JVD, Gastrointestinal system: Abdomen is nondistended, soft and nontender. No organomegaly or masses felt. Normal bowel sounds heard. Central nervous system: Alert and oriented. No focal neurological deficits. Extremities: Symmetric 5 x 5 power. Skin: No rashes, lesions or ulcers Psychiatry:  Mood & affect appropriate.     Data Reviewed: I have personally reviewed following labs and imaging studies  CBC: Recent Labs  Lab 03/08/18 1745  WBC 5.8  HGB 12.4  HCT 41.4  MCV 79.3*  PLT 194   Basic Metabolic Panel: Recent Labs  Lab 03/08/18 1745 03/09/18 0318  NA 138 138  K 3.9 3.3*  CL 111 110  CO2 19* 20*  GLUCOSE 115* 94  BUN 22* 20  CREATININE 1.18* 1.26*  CALCIUM 9.2 8.6*  MG  --  2.2   GFR: Estimated Creatinine Clearance: 53 mL/min (A) (by C-G formula based on SCr of  1.26 mg/dL (H)). Liver Function Tests: No results for input(s): AST, ALT, ALKPHOS, BILITOT, PROT, ALBUMIN in the last 168 hours. No results for input(s): LIPASE, AMYLASE in the last 168 hours. No results for input(s): AMMONIA in the last 168 hours. Coagulation Profile: No results for input(s): INR, PROTIME in the last 168 hours. Cardiac Enzymes: No results for input(s): CKTOTAL, CKMB, CKMBINDEX, TROPONINI in the last 168 hours. BNP (last 3 results) No results for input(s): PROBNP in the last 8760 hours. HbA1C: No results for input(s): HGBA1C in the last 72 hours. CBG: No results for input(s): GLUCAP in the last 168 hours. Lipid Profile: No results for input(s): CHOL, HDL, LDLCALC, TRIG, CHOLHDL, LDLDIRECT in the last 72 hours. Thyroid Function Tests: Recent Labs    03/09/18 0318  TSH 2.563   Anemia Panel: No results for input(s): VITAMINB12, FOLATE, FERRITIN, TIBC, IRON, RETICCTPCT in the last 72 hours. Sepsis Labs: No results for input(s): PROCALCITON, LATICACIDVEN in the last 168 hours.  No results found for this or any previous visit (from the past 240 hour(s)).       Radiology Studies: Dg Chest 2 View  Result Date: 03/08/2018 CLINICAL DATA:  52 y/o  F; 1 week of shortness of breath. EXAM: CHEST - 2 VIEW COMPARISON:  06/15/2017 chest radiograph FINDINGS: Stable cardiomegaly given projection and technique. Calcific aortic atherosclerosis. Diffuse reticular and patchy opacities of the lungs with basilar predominance. Small bilateral pleural effusions packing along fissures. No pneumothorax. Bones are unremarkable. IMPRESSION: Cardiomegaly with interstitial and alveolar pulmonary edema. Small bilateral effusions. Electronically Signed   By: Kristine Garbe M.D.   On: 03/08/2018 19:35   Ct Angio Chest Pe W And/or Wo Contrast  Result Date: 03/08/2018 CLINICAL DATA:  Dyspnea x1 week EXAM: CT ANGIOGRAPHY CHEST WITH CONTRAST TECHNIQUE: Multidetector CT imaging of the  chest was performed using the standard protocol during bolus administration of intravenous contrast. Multiplanar CT image reconstructions and MIPs were obtained to evaluate the vascular anatomy. CONTRAST:  114mL ISOVUE-370 IOPAMIDOL (ISOVUE-370) INJECTION 76% COMPARISON:  CXR from earlier on the same day and 06/15/2017, chest CT 04/12/2014 FINDINGS: Cardiovascular: Stable cardiomegaly without pericardial effusion or thickening. Satisfactory opacification of the pulmonary arteries to the segmental level without acute pulmonary embolus. Ectatic thoracic aorta to 3.8 cm along the ascending portion. Conventional branch pattern of the great vessels with minimal atherosclerosis. Scattered coronary arteriosclerosis is identified. Mediastinum/Nodes: Midline patent trachea and mainstem bronchi. No pathologically enlarged mediastinal, hilar, axillary or supraclavicular nodes. No thyromegaly or visualized mass. Lungs/Pleura: Small to moderate right effusion with adjacent atelectasis. Minimal atelectasis at the left lung base. Central vascular congestion mild ground-glass opacities noted throughout both aerated lungs compatible with stigmata of CHF. Streaky subsegmental atelectasis and/or scarring is seen at each base and along the fissures. Upper Abdomen: Reflux of contrast into the hepatic veins compatible with a component of right heart failure. Musculoskeletal: No chest wall abnormality. No acute or significant osseous findings. Review of the MIP images confirms the above findings. IMPRESSION: 1. No acute pulmonary embolus. 2. Small to moderate right pleural effusion with  adjacent atelectasis. 3. Cardiomegaly with diffuse ground-glass opacities of the lungs bilaterally. This in conjunction with the pleural effusions would be in keeping with mild CHF. Aortic Atherosclerosis (ICD10-I70.0). Electronically Signed   By: Ashley Royalty M.D.   On: 03/08/2018 21:11        Scheduled Meds: . aspirin EC  325 mg Oral Daily  .  atorvastatin  40 mg Oral q1800  . buPROPion  150 mg Oral BID  . citalopram  40 mg Oral Daily  . enoxaparin (LOVENOX) injection  40 mg Subcutaneous Q24H  . famotidine  20 mg Oral BID  . furosemide  40 mg Intravenous Q12H  . mometasone-formoterol  2 puff Inhalation BID  . sodium chloride flush  3 mL Intravenous Q12H  . topiramate  50 mg Oral QHS  . traZODone  150 mg Oral QHS   Continuous Infusions: . sodium chloride    . diltiazem (CARDIZEM) infusion Stopped (03/09/18 1353)     LOS: 0 days    Time spent: 38 minutes    Hosie Poisson, MD [facility] Pager [phone] If 7PM-7AM, please contact night-coverage [email or web address] Password [facility] 03/09/2018, 6:32 PM

## 2018-03-09 NOTE — Progress Notes (Signed)
  Echocardiogram 2D Echocardiogram has been performed.  Jeric Slagel G Vincie Linn 03/09/2018, 10:14 AM

## 2018-03-10 DIAGNOSIS — I5033 Acute on chronic diastolic (congestive) heart failure: Secondary | ICD-10-CM

## 2018-03-10 DIAGNOSIS — Z8673 Personal history of transient ischemic attack (TIA), and cerebral infarction without residual deficits: Secondary | ICD-10-CM | POA: Diagnosis not present

## 2018-03-10 DIAGNOSIS — I2510 Atherosclerotic heart disease of native coronary artery without angina pectoris: Secondary | ICD-10-CM | POA: Diagnosis present

## 2018-03-10 DIAGNOSIS — I4891 Unspecified atrial fibrillation: Secondary | ICD-10-CM

## 2018-03-10 DIAGNOSIS — R0602 Shortness of breath: Secondary | ICD-10-CM | POA: Diagnosis present

## 2018-03-10 DIAGNOSIS — Z79899 Other long term (current) drug therapy: Secondary | ICD-10-CM | POA: Diagnosis not present

## 2018-03-10 DIAGNOSIS — N179 Acute kidney failure, unspecified: Secondary | ICD-10-CM | POA: Diagnosis present

## 2018-03-10 DIAGNOSIS — J449 Chronic obstructive pulmonary disease, unspecified: Secondary | ICD-10-CM | POA: Diagnosis present

## 2018-03-10 DIAGNOSIS — F419 Anxiety disorder, unspecified: Secondary | ICD-10-CM | POA: Diagnosis present

## 2018-03-10 DIAGNOSIS — F17210 Nicotine dependence, cigarettes, uncomplicated: Secondary | ICD-10-CM | POA: Diagnosis present

## 2018-03-10 DIAGNOSIS — F418 Other specified anxiety disorders: Secondary | ICD-10-CM | POA: Diagnosis present

## 2018-03-10 DIAGNOSIS — Z7982 Long term (current) use of aspirin: Secondary | ICD-10-CM | POA: Diagnosis not present

## 2018-03-10 DIAGNOSIS — I480 Paroxysmal atrial fibrillation: Secondary | ICD-10-CM

## 2018-03-10 DIAGNOSIS — J810 Acute pulmonary edema: Secondary | ICD-10-CM

## 2018-03-10 DIAGNOSIS — I4819 Other persistent atrial fibrillation: Secondary | ICD-10-CM

## 2018-03-10 DIAGNOSIS — F1411 Cocaine abuse, in remission: Secondary | ICD-10-CM

## 2018-03-10 DIAGNOSIS — I160 Hypertensive urgency: Secondary | ICD-10-CM | POA: Diagnosis present

## 2018-03-10 DIAGNOSIS — Z23 Encounter for immunization: Secondary | ICD-10-CM | POA: Diagnosis not present

## 2018-03-10 DIAGNOSIS — F333 Major depressive disorder, recurrent, severe with psychotic symptoms: Secondary | ICD-10-CM | POA: Diagnosis present

## 2018-03-10 DIAGNOSIS — E876 Hypokalemia: Secondary | ICD-10-CM | POA: Diagnosis present

## 2018-03-10 DIAGNOSIS — J9601 Acute respiratory failure with hypoxia: Secondary | ICD-10-CM | POA: Diagnosis present

## 2018-03-10 DIAGNOSIS — I471 Supraventricular tachycardia: Secondary | ICD-10-CM | POA: Diagnosis present

## 2018-03-10 DIAGNOSIS — I110 Hypertensive heart disease with heart failure: Secondary | ICD-10-CM | POA: Diagnosis present

## 2018-03-10 DIAGNOSIS — I340 Nonrheumatic mitral (valve) insufficiency: Secondary | ICD-10-CM

## 2018-03-10 DIAGNOSIS — G4700 Insomnia, unspecified: Secondary | ICD-10-CM | POA: Diagnosis present

## 2018-03-10 DIAGNOSIS — I081 Rheumatic disorders of both mitral and tricuspid valves: Secondary | ICD-10-CM | POA: Diagnosis present

## 2018-03-10 DIAGNOSIS — E785 Hyperlipidemia, unspecified: Secondary | ICD-10-CM | POA: Diagnosis present

## 2018-03-10 DIAGNOSIS — F5105 Insomnia due to other mental disorder: Secondary | ICD-10-CM | POA: Diagnosis present

## 2018-03-10 DIAGNOSIS — I2729 Other secondary pulmonary hypertension: Secondary | ICD-10-CM | POA: Diagnosis present

## 2018-03-10 DIAGNOSIS — F1410 Cocaine abuse, uncomplicated: Secondary | ICD-10-CM | POA: Diagnosis present

## 2018-03-10 LAB — BASIC METABOLIC PANEL
Anion gap: 8 (ref 5–15)
BUN: 22 mg/dL — ABNORMAL HIGH (ref 6–20)
CO2: 24 mmol/L (ref 22–32)
Calcium: 8.6 mg/dL — ABNORMAL LOW (ref 8.9–10.3)
Chloride: 107 mmol/L (ref 98–111)
Creatinine, Ser: 1.5 mg/dL — ABNORMAL HIGH (ref 0.44–1.00)
GFR calc non Af Amer: 39 mL/min — ABNORMAL LOW (ref >60)
GFR, EST AFRICAN AMERICAN: 45 mL/min — AB (ref >60)
Glucose, Bld: 94 mg/dL (ref 70–99)
Potassium: 3.6 mmol/L (ref 3.5–5.1)
SODIUM: 139 mmol/L (ref 135–145)

## 2018-03-10 LAB — SEDIMENTATION RATE: SED RATE: 15 mm/h (ref 0–22)

## 2018-03-10 MED ORDER — DILTIAZEM HCL 60 MG PO TABS
30.0000 mg | ORAL_TABLET | Freq: Three times a day (TID) | ORAL | Status: AC
  Administered 2018-03-10 – 2018-03-11 (×4): 30 mg via ORAL
  Filled 2018-03-10 (×5): qty 1

## 2018-03-10 MED ORDER — SODIUM CHLORIDE 0.9 % IV SOLN
INTRAVENOUS | Status: DC
  Administered 2018-03-11: 06:00:00 via INTRAVENOUS

## 2018-03-10 MED ORDER — DILTIAZEM HCL 25 MG/5ML IV SOLN
20.0000 mg | Freq: Once | INTRAVENOUS | Status: AC
  Administered 2018-03-10: 20 mg via INTRAVENOUS
  Filled 2018-03-10: qty 5

## 2018-03-10 MED ORDER — FUROSEMIDE 10 MG/ML IJ SOLN
40.0000 mg | Freq: Every day | INTRAMUSCULAR | Status: DC
  Administered 2018-03-11 – 2018-03-14 (×4): 40 mg via INTRAVENOUS
  Filled 2018-03-10 (×4): qty 4

## 2018-03-10 MED ORDER — RIVAROXABAN 15 MG PO TABS
15.0000 mg | ORAL_TABLET | Freq: Every day | ORAL | Status: DC
  Administered 2018-03-10: 15 mg via ORAL
  Filled 2018-03-10: qty 1

## 2018-03-10 NOTE — Discharge Instructions (Addendum)
Heart Failure °Heart failure is a condition in which the heart has trouble pumping blood because it has become weak or stiff. This means that the heart does not pump blood efficiently for the body to work well. For some people with heart failure, fluid may back up into the lungs and there may be swelling (edema) in the lower legs. Heart failure is usually a long-term (chronic) condition. It is important for you to take good care of yourself and follow the treatment plan from your health care provider. °What are the causes? °This condition is caused by some health problems, including: °· High blood pressure (hypertension). Hypertension causes the heart muscle to work harder than normal. High blood pressure eventually causes the heart to become stiff and weak. °· Coronary artery disease (CAD). CAD is the buildup of cholesterol and fat (plaques) in the arteries of the heart. °· Heart attack (myocardial infarction). Injured tissue, which is caused by the heart attack, does not contract as well and the heart's ability to pump blood is weakened. °· Abnormal heart valves. When the heart valves do not open and close properly, the heart muscle must pump harder to keep the blood flowing. °· Heart muscle disease (cardiomyopathy or myocarditis). Heart muscle disease is damage to the heart muscle from a variety of causes, such as drug or alcohol abuse, infections, or unknown causes. These can increase the risk of heart failure. °· Lung disease. When the lungs do not work properly, the heart must work harder. °What increases the risk? °Risk of heart failure increases as a person ages. This condition is also more likely to develop in people who: °· Are overweight. °· Are female. °· Smoke or chew tobacco. °· Abuse alcohol or illegal drugs. °· Have taken medicines that can damage the heart, such as chemotherapy drugs. °· Have diabetes. °? High blood sugar (glucose) is associated with high fat (lipid) levels in the blood. °? Diabetes  can also damage tiny blood vessels that carry nutrients to the heart muscle. °· Have abnormal heart rhythms. °· Have thyroid problems. °· Have low blood counts (anemia). °What are the signs or symptoms? °Symptoms of this condition include: °· Shortness of breath with activity, such as when climbing stairs. °· Persistent cough. °· Swelling of the feet, ankles, legs, or abdomen. °· Unexplained weight gain. °· Difficulty breathing when lying flat (orthopnea). °· Waking from sleep because of the need to sit up and get more air. °· Rapid heartbeat. °· Fatigue and loss of energy. °· Feeling light-headed, dizzy, or close to fainting. °· Loss of appetite. °· Nausea. °· Increased urination during the night (nocturia). °· Confusion. °How is this diagnosed? °This condition is diagnosed based on: °· Medical history, symptoms, and a physical exam. °· Diagnostic tests, which may include: °? Echocardiogram. °? Electrocardiogram (ECG). °? Chest X-ray. °? Blood tests. °? Exercise stress test. °? Radionuclide scans. °? Cardiac catheterization and angiogram. °How is this treated? °Treatment for this condition is aimed at managing the symptoms of heart failure. Medicines, behavioral changes, or other treatments may be necessary to treat heart failure. °Medicines °These may include: °· Angiotensin-converting enzyme (ACE) inhibitors. This type of medicine blocks the effects of a blood protein called angiotensin-converting enzyme. ACE inhibitors relax (dilate) the blood vessels and help to lower blood pressure. °· Angiotensin receptor blockers (ARBs). This type of medicine blocks the actions of a blood protein called angiotensin. ARBs dilate the blood vessels and help to lower blood pressure. °· Water pills (diuretics). Diuretics cause   the kidneys to remove salt and water from the blood. The extra fluid is removed through urination, leaving a lower volume of blood that the heart has to pump. °· Beta blockers. These improve heart muscle  strength and they prevent the heart from beating too quickly. °· Digoxin. This increases the force of the heartbeat. °Healthy behavior changes °These may include: °· Reaching and maintaining a healthy weight. °· Stopping smoking or chewing tobacco. °· Eating heart-healthy foods. °· Limiting or avoiding alcohol. °· Stopping use of street drugs (illegal drugs). °· Physical activity. °Other treatments °These may include: °· Surgery to open blocked coronary arteries or repair damaged heart valves. °· Placement of a biventricular pacemaker to improve heart muscle function (cardiac resynchronization therapy). This device paces both the right ventricle and left ventricle. °· Placement of a device to treat serious abnormal heart rhythms (implantable cardioverter defibrillator, or ICD). °· Placement of a device to improve the pumping ability of the heart (left ventricular assist device, or LVAD). °· Heart transplant. This can cure heart failure, and it is considered for certain patients who do not improve with other therapies. °Follow these instructions at home: °Medicines °· Take over-the-counter and prescription medicines only as told by your health care provider. Medicines are important in reducing the workload of your heart, slowing the progression of heart failure, and improving your symptoms. °? Do not stop taking your medicine unless your health care provider told you to do that. °? Do not skip any dose of medicine. °? Refill your prescriptions before you run out of medicine. You need your medicines every day. °Eating and drinking ° °· Eat heart-healthy foods. Talk with a dietitian to make an eating plan that is right for you. °? Choose foods that contain no trans fat and are low in saturated fat and cholesterol. Healthy choices include fresh or frozen fruits and vegetables, fish, lean meats, legumes, fat-free or low-fat dairy products, and whole-grain or high-fiber foods. °? Limit salt (sodium) if directed by your  health care provider. Sodium restriction may reduce symptoms of heart failure. Ask a dietitian to recommend heart-healthy seasonings. °? Use healthy cooking methods instead of frying. Healthy methods include roasting, grilling, broiling, baking, poaching, steaming, and stir-frying. °· Limit your fluid intake if directed by your health care provider. Fluid restriction may reduce symptoms of heart failure. °Lifestyle ° °· Stop smoking or using chewing tobacco. Nicotine and tobacco can damage your heart and your blood vessels. Do not use nicotine gum or patches before talking to your health care provider. °· Limit alcohol intake to no more than 1 drink per day for non-pregnant women and 2 drinks per day for men. One drink equals 12 oz of beer, 5 oz of wine, or 1½ oz of hard liquor. °? Drinking more than that is harmful to your heart. Tell your health care provider if you drink alcohol several times a week. °? Talk with your health care provider about whether any level of alcohol use is safe for you. °? If your heart has already been damaged by alcohol or you have severe heart failure, drinking alcohol should be stopped completely. °· Stop use of illegal drugs. °· Lose weight if directed by your health care provider. Weight loss may reduce symptoms of heart failure. °· Do moderate physical activity if directed by your health care provider. People who are elderly and people with severe heart failure should consult with a health care provider for physical activity recommendations. °Monitor important information ° °· Weigh   yourself every day. Keeping track of your weight daily helps you to notice excess fluid sooner. ? Weigh yourself every morning after you urinate and before you eat breakfast. ? Wear the same amount of clothing each time you weigh yourself. ? Record your daily weight. Provide your health care provider with your weight record.  Monitor and record your blood pressure as told by your health care  provider.  Check your pulse as told by your health care provider. Dealing with extreme temperatures  If the weather is extremely hot: ? Avoid vigorous physical activity. ? Use air conditioning or fans or seek a cooler location. ? Avoid caffeine and alcohol. ? Wear loose-fitting, lightweight, and light-colored clothing.  If the weather is extremely cold: ? Avoid vigorous physical activity. ? Layer your clothes. ? Wear mittens or gloves, a hat, and a scarf when you go outside. ? Avoid alcohol. General instructions  Manage other health conditions such as hypertension, diabetes, thyroid disease, or abnormal heart rhythms as told by your health care provider.  Learn to manage stress. If you need help to do this, ask your health care provider.  Plan rest periods when fatigued.  Get ongoing education and support as needed.  Participate in or seek rehabilitation as needed to maintain or improve independence and quality of life.  Stay up to date with immunizations. Keeping current on pneumococcal and influenza immunizations is especially important to prevent respiratory infections.  Keep all follow-up visits as told by your health care provider. This is important. Contact a health care provider if:  You have a rapid weight gain.  You have increasing shortness of breath that is unusual for you.  You are unable to participate in your usual physical activities.  You tire easily.  You cough more than normal, especially with physical activity.  You have any swelling or more swelling in areas such as your hands, feet, ankles, or abdomen.  You are unable to sleep because it is hard to breathe.  You feel like your heart is beating quickly (palpitations).  You become dizzy or light-headed when you stand up. Get help right away if:  You have difficulty breathing.  You notice or your family notices a change in your awareness, such as having trouble staying awake or having difficulty  with concentration.  You have pain or discomfort in your chest.  You have an episode of fainting (syncope). This information is not intended to replace advice given to you by your health care provider. Make sure you discuss any questions you have with your health care provider. Document Released: 03/09/2005 Document Revised: 02/05/2017 Document Reviewed: 10/02/2015 Elsevier Interactive Patient Education  2019 [place] on my medicine - XARELTO (Rivaroxaban)  This medication education was reviewed with me or my healthcare representative as part of my discharge preparation.  The pharmacist that spoke with me during my hospital stay was:  Tad Moore, [facility]  Why was Xarelto prescribed for you? Xarelto was prescribed for you to reduce the risk of a blood clot forming that can cause a stroke if you have a medical condition called atrial fibrillation (a type of irregular heartbeat).  What do you need to know about xarelto ? Take your Xarelto ONCE DAILY at the same time every day with your evening meal. If you have difficulty swallowing the tablet whole, you may crush it and mix in applesauce just prior to taking your dose.  Take Xarelto exactly as prescribed by your doctor and DO NOT stop  taking Xarelto without talking to the doctor who prescribed the medication.  Stopping without other stroke prevention medication to take the place of Xarelto may increase your risk of developing a clot that causes a stroke.  Refill your prescription before you run out.  After discharge, you should have regular check-up appointments with your healthcare provider that is prescribing your Xarelto.  In the future your dose may need to be changed if your kidney function or weight changes by a significant amount.  What do you do if you miss a dose? If you are taking Xarelto ONCE DAILY and you miss a dose, take it as soon as you remember on the same day then continue your regularly scheduled  once daily regimen the next day. Do not take two doses of Xarelto at the same time or on the same day.   Important Safety Information A possible side effect of Xarelto is bleeding. You should call your healthcare provider right away if you experience any of the following: ? Bleeding from an injury or your nose that does not stop. ? Unusual colored urine (red or dark brown) or unusual colored stools (red or black). ? Unusual bruising for unknown reasons. ? A serious fall or if you hit your head (even if there is no bleeding).  Some medicines may interact with Xarelto and might increase your risk of bleeding while on Xarelto. To help avoid this, consult your healthcare provider or pharmacist prior to using any new prescription or non-prescription medications, including herbals, vitamins, non-steroidal anti-inflammatory drugs (NSAIDs) and supplements.  This website has more information on Xarelto: [email or web address]

## 2018-03-10 NOTE — Consult Note (Addendum)
Cardiology Consultation:   Patient ID: Morgan Bean MRN: [id number]; DOB: 22-Sep-1965  Admit date: 03/08/2018 Date of Consult: 03/10/2018  Primary Care Provider: Patient, No Pcp Per Primary Cardiologist: [name], MD - New Primary Electrophysiologist:  None   Patient Profile:   Morgan Bean is a 52 y.o. female with a hx of polysubstance abuse, depression with anxiety, chronic diastolic CHF, pulmonary hypertension, CAD, history of CVA, atrial flutter s/p ablation, sleep apnea and COPD who is being seen today for the evaluation of CHF and atrial tachycardia at the request of Dr. Karleen Hampshire.  History of Present Illness:   Morgan Bean presented to the ED on 03/08/2018 for evaluation of shortness of breath and orthopnea.  Her symptoms began about 2 weeks ago and have progressively worsened.  She began with dyspnea on exertion that progressed to dyspnea at rest with significant orthopnea and PND making it so she could not sleep.  Upon questioning she admits that she had been doing cocaine for about a week prior to her symptoms.  She has not done any cocaine since she became short of breath.  She says that she smokes cocaine every day for a week or 2 and then stops for a week or 2 and then goes back to every day.  She has been working on quitting smoking and was down to 2 cigarettes a day at the time that her symptoms began and she has not smoked since.  She denies any alcohol use.  She has had occasional chest discomfort that has been her norm over the years.  She says that she has been evaluated for the same discomfort with no significant findings.  She is not having any new chest discomfort.  She denies any peripheral edema but she has had abdominal fullness.  She denies any palpitations or fluttering sensations in her chest.  Currently she is still short of breath with minimal exertion. No edema.   Morgan Bean was ordered Lasix 40 mg twice daily but admits that she does not take her Lasix as directed.   She may possibly take it a couple of times a week.  She has CPAP, but does not use it at all.  Morgan Bean was previously followed by [place] [facility] but moved to [facility] a couple of years ago and was followed at [facility] until she recently moved back to [place] and has not reestablished care here.  She has been evaluated several times for shortness of breath and has had right heart cath in 2012 and 2017 with most recent findings of near normal right and left heart filling pressures, no pulmonary hypertension.  Her breathing difficulties were felt to be more related to COPD.  She was seen by pulmonologist at [facility] in 10/2016 who felt that she had no findings consistent with COPD or asthma.  She was found to have complex sleep apnea and referred for CPAP titration.  She has a history of atrial flutter and underwent atrial flutter ablation 04/28/2016 at [facility].  She says that she was on anticoagulation for 1 month post ablation.  In 12/2016 at [facility] she was evaluated for lightheadedness and disequilibrium/possible syncope with nausea and palpitations.  A 48-hour Holter monitor revealed rare PACs and PVCs, no significant arrhythmias.    Atrial flutter ablation 04/28/2016 TEE 04/28/2016: Mild AR, mild MR, trivial PR, mild TR, no LA or LA appendage thrombus Echo 04/26/2016: EF 55%, moderate TR, mild pulmonary hypertension, mildly enlarged RV, normal right atrial pressure. Myoview 11/30/2015:  No reversible perfusion defects, normal LV EF, coronary artery atherosclerotic calcification seen on CT portion of the exam RHC 04/23/2015: Near-normal right and left heart filling pressures, no pulmonary hypertension.  No evidence for hemodynamically significant TR. Suspect that COPD may be the major cause of her dyspnea at this point. Cardiac output lower than expected.  RHC (12/2010) with mean RA 4, PA 28/9, mean PCWP 9, CI 3.8. Echo (06/2012) with EF 65%, moderate LVH, mild AI, mild MR, mild TR.   Lexiscan myoview  (12/2010) with significant ST depression upon Lexiscan injection but no ischemia or infarction on perfusion images.   Left heart cath (12/2010): 30% mid LAD, 30% ostial D1, 50% ostial D2.   Past Medical History:  Diagnosis Date  . Anemia of other chronic disease 11/13/2013  . Anginal pain ([place])    none in past year  . Anxiety   . Asthma   . Candidiasis 06/23/2013  . Chronic diastolic CHF (congestive heart failure) ([place])   . Constipation   . Coronary artery disease    Lexiscan myoview (10/12) with significant ST depression upon Lexiscan injection but no ischemia or infarction on perfusion images. Left heart cath (10/12): 30% mid LAD, 30% ostial D1, 50% ostial D2.    [name] Cough 11/13/2013  . DCIS (ductal carcinoma in situ) of breast   . Depression   . Headache 02/22/2014  . Hx of cardiovascular stress test    Lexiscan Myoview (2/16): Breast attenuation artifact, no ischemia, EF 64%; low risk  . Hyperlipemia   . Hypertension   . Iron deficiency    hx of  . Leukocytoclastic vasculitis ([place])    Rash across lower body, occurred in 9/11, diagnosed by skin biopsy. ANA positive. Thought to be secondary to cocaine use.  . Leukopenia 03/21/2013  . Lymphadenopathy 03/21/2013  . Lymphadenopathy 01/30/2014  . Mental disorder   . Pancytopenia ([place])   . Pancytopenia, acquired ([place]) 07/16/2015  . Polysubstance abuse ([place])    Prior cocaine  . Pulmonary HTN ([place])    Echo (9/11) with EF 65%, mild LVH, mild AI, mild MR, moderate to possibly severe TR with PA systolic pressure 52 mmHg. Echo (10/12): severe LV hypertrophy, EF 60-65%, mild MR, mild AI, moderate to severe tricuspid regurgitation, PA systolic pressure 38 mmHg.  Echo 4/14: moderate LVH, EF 65%, normal wall motion, diastolic dysfunction, mild AI, mild MR  . Shortness of breath    a. PFTs 5/14:  FEV1/FVC 99% predicted; FVC 60% predicted; DLCO mildly reduced, mild restriction, air trapping.;  b.  seen by pulmo (Dr. [name]) 5/14: mainly upper airway  symptoms - ACE d/c'd (sx's better)  . Stroke ([place]) 2010ish  . Syphilis 04/25/2013  . Tobacco abuse   . Tricuspid regurgitation   . Unspecified deficiency anemia 03/29/2013    Past Surgical History:  Procedure Laterality Date  . ANKLE SURGERY     right  . BONE MARROW ASPIRATION    . CARDIAC CATHETERIZATION    . CARDIAC CATHETERIZATION N/A 04/23/2015   Procedure: Right Heart Cath;  Surgeon: [name], MD;  Location: [place] [facility];  Service: Cardiovascular;  Laterality: N/A;  . I&D EXTREMITY  08/09/2011   Procedure: IRRIGATION AND DEBRIDEMENT EXTREMITY;  Surgeon: [name], MD;  Location: [place];  Service: General;  Laterality: Left;  i & D Left axilla abscess  . LYMPH NODE BIOPSY N/A 04/05/2013   Procedure: EXCISIONAL BIOPSY RIGHT SUBMANDIBULAR NODE, NASAL ENDOSCOPIC WITH BIOPSY NASAL PHARYNX;  Surgeon: Ileene Hutchinson  Erik Obey, MD;  Location: [place];  Service: ENT;  Laterality: N/A;     Home Medications:  Prior to Admission medications   Medication Sig Start Date End Date Taking? Authorizing Provider  albuterol (PROVENTIL HFA;VENTOLIN HFA) 108 (90 Base) MCG/ACT inhaler Inhale 2 puffs into the lungs every 6 (six) hours as needed for wheezing or shortness of breath. 12/06/15  Yes [name], Olugbemiga E, MD  amLODipine (NORVASC) 10 MG tablet Take 1 tablet (10 mg total) by mouth daily. 08/01/14  Yes Burtis Junes, NP  aspirin EC 325 MG tablet Take 325 mg by mouth daily.   Yes [provider]  atorvastatin (LIPITOR) 40 MG tablet TAKE 1 TABLET BY MOUTH IN THE EVENING Patient taking differently: Take 40 mg by mouth daily at 6 PM.  02/18/15  Yes [name], MD  furosemide (LASIX) 20 MG tablet Take 2 tablets (40 mg total) by mouth 2 (two) times daily. Patient taking differently: Take 20-40 mg by mouth 2 (two) times daily. Take 2 tablets every morning and 1 tablet every evening 04/18/15  Yes [name], MD  ranitidine (ZANTAC) 150 MG tablet Take 1 tablet (150 mg total) by mouth 2  (two) times daily. 08/21/15  Yes Langeland, Dawn T, MD  topiramate (TOPAMAX) 25 MG tablet Take 2 tablets at night Patient taking differently: Take 50 mg by mouth at bedtime.  03/13/15  Yes Cameron Sprang, MD  traZODone (DESYREL) 150 MG tablet Take 150 mg by mouth at bedtime. 01/07/16  Yes [provider]  citalopram (CELEXA) 40 MG tablet Take 1 tablet (40 mg total) by mouth daily. Patient not taking: Reported on 03/09/2018 08/25/13   Tresa Garter, MD  lisinopril (PRINIVIL,ZESTRIL) 10 MG tablet Take 1 tablet (10 mg total) by mouth daily. Patient not taking: Reported on 03/09/2018 11/10/17   Argentina Donovan, PA-C  loratadine (CLARITIN) 10 MG tablet Take 1 tablet (10 mg total) by mouth daily. Patient not taking: Reported on 03/09/2018 09/20/13   Lance Bosch, NP    Inpatient Medications: Scheduled Meds: . atorvastatin  40 mg Oral q1800  . buPROPion  150 mg Oral BID  . citalopram  40 mg Oral Daily  . diltiazem  30 mg Oral Q8H  . famotidine  20 mg Oral BID  . [START ON 03/11/2018] furosemide  40 mg Intravenous Daily  . mometasone-formoterol  2 puff Inhalation BID  . rivaroxaban  15 mg Oral Q supper  . sodium chloride flush  3 mL Intravenous Q12H  . topiramate  50 mg Oral QHS  . traZODone  150 mg Oral QHS   Continuous Infusions: . sodium chloride     PRN Meds: sodium chloride, acetaminophen, hydrALAZINE, hydrOXYzine, ondansetron (ZOFRAN) IV, sodium chloride flush  Allergies:    Allergies  Allergen Reactions  . Ciprofloxacin Itching    Social History:   Social History   Socioeconomic History  . Marital status: Single    Spouse name: Not on file  . Number of children: 2  . Years of education: Not on file  . Highest education level: Not on file  Occupational History  . Occupation: unemployed  Social Needs  . Financial resource strain: Not on file  . Food insecurity:    Worry: Not on file    Inability: Not on file  . Transportation needs:    Medical: Not  on file    Non-medical: Not on file  Tobacco Use  . Smoking status: Current Every Day Smoker  Packs/day: 0.00    Years: 30.00    Pack years: 0.00    Types: Cigarettes  . Smokeless tobacco: Never Used  Substance and Sexual Activity  . Alcohol use: No    Alcohol/week: 0.0 standard drinks    Comment: pint of wine 2-3 times a week--ocassional - quit 2012  . Drug use: No    Frequency: 1.0 times per week    Types: "Crack" cocaine    Comment: none since 02/04/14  . Sexual activity: Yes  Lifestyle  . Physical activity:    Days per week: Not on file    Minutes per session: Not on file  . Stress: Not on file  Relationships  . Social connections:    Talks on phone: Not on file    Gets together: Not on file    Attends religious service: Not on file    Active member of club or organization: Not on file    Attends meetings of clubs or organizations: Not on file    Relationship status: Not on file  . Intimate partner violence:    Fear of current or ex partner: Not on file    Emotionally abused: Not on file    Physically abused: Not on file    Forced sexual activity: Not on file  Other Topics Concern  . Not on file  Social History Narrative   Lives with mom, sisters and brothers.    Family History:    Family History  Problem Relation Age of Onset  . Coronary artery disease Mother   . Diabetes Mother   . Diabetes Father   . Hypertension Father   . Coronary artery disease Father   . Heart attack Father   . Breast cancer Maternal Grandmother   . Diabetes Maternal Grandmother   . Heart attack Maternal Grandmother   . Stroke Maternal Grandmother      ROS:  Please see the history of present illness.   All other ROS reviewed and negative.     Physical Exam/Data:   Vitals:   03/10/18 0727 03/10/18 0817 03/10/18 0932 03/10/18 1201  BP: (!) 111/97     Pulse: (!) 109 89  87  Resp:  18  17  Temp:  97.9 F (36.6 C)  97.8 F (36.6 C)  TempSrc:  Oral  Oral  SpO2: 99% 100%  96%   Weight:      Height:        Intake/Output Summary (Last 24 hours) at 03/10/2018 1518 Last data filed at 03/10/2018 1300 Gross per 24 hour  Intake 480 ml  Output 400 ml  Net 80 ml   Filed Weights   03/08/18 2255 03/08/18 2308  Weight: 75.6 kg 75.6 kg   Body mass index is 26.89 kg/m.  General:  Well nourished, well developed, in no acute distress HEENT: normal Lymph: no adenopathy Neck: Mild JVD Endocrine:  No thryomegaly Vascular: No carotid bruits; FA pulses 2+ bilaterally without bruits  Cardiac:  normal S1, S2; irregularly irregular rhythm; no murmur  Lungs:  clear to auscultation bilaterally, no wheezing, rhonchi or rales  Abd: soft, nontender, no hepatomegaly  Ext: no edema Musculoskeletal:  No deformities, BUE and BLE strength normal and equal Skin: warm and dry  Neuro:  CNs 2-12 intact, no focal abnormalities noted Psych:  Normal affect   EKG:  The EKG was personally reviewed and demonstrates:  Atrial flutter with RVR, LVH Telemetry:  Telemetry was personally reviewed and demonstrates: [place] fibrillation in the 70s-90s  Relevant CV Studies:  Echocardiogram 03/09/18 Study Conclusions - Left ventricle: The cavity size was normal. Wall thickness was   normal. Systolic function was normal. The estimated ejection   fraction was in the range of 50% to 55%. The study is not   technically sufficient to allow evaluation of LV diastolic   function. - Aortic valve: There was mild regurgitation. - Mitral valve: There was moderate to severe regurgitation. - Left atrium: The atrium was severely dilated. - Atrial septum: No defect or patent foramen ovale was identified. - Tricuspid valve: There was moderate regurgitation. - Pulmonary arteries: PA peak pressure: 39 mm Hg (S).  Laboratory Data:  Chemistry Recent Labs  Lab 03/08/18 1745 03/09/18 0318 03/10/18 0325  NA 138 138 139  K 3.9 3.3* 3.6  CL 111 110 107  CO2 19* 20* 24  GLUCOSE 115* 94 94  BUN 22*  20 22*  CREATININE 1.18* 1.26* 1.50*  CALCIUM 9.2 8.6* 8.6*  GFRNONAA 52* 48* 39*  GFRAA >60 56* 45*  ANIONGAP 8 8 8     No results for input(s): PROT, ALBUMIN, AST, ALT, ALKPHOS, BILITOT in the last 168 hours. Hematology Recent Labs  Lab 03/08/18 1745  WBC 5.8  RBC 5.22*  HGB 12.4  HCT 41.4  MCV 79.3*  MCH 23.8*  MCHC 30.0  RDW 18.8*  PLT 150   Cardiac EnzymesNo results for input(s): TROPONINI in the last 168 hours.  Recent Labs  Lab 03/08/18 1752  TROPIPOC 0.03    BNP Recent Labs  Lab 03/08/18 1746  BNP 497.1*    DDimer  Recent Labs  Lab 03/08/18 1745  DDIMER 2.00*    Radiology/Studies:  Dg Chest 2 View  Result Date: 03/08/2018 CLINICAL DATA:  52 y/o  F; 1 week of shortness of breath. EXAM: CHEST - 2 VIEW COMPARISON:  06/15/2017 chest radiograph FINDINGS: Stable cardiomegaly given projection and technique. Calcific aortic atherosclerosis. Diffuse reticular and patchy opacities of the lungs with basilar predominance. Small bilateral pleural effusions packing along fissures. No pneumothorax. Bones are unremarkable. IMPRESSION: Cardiomegaly with interstitial and alveolar pulmonary edema. Small bilateral effusions. Electronically Signed   By: Kristine Garbe M.D.   On: 03/08/2018 19:35   Ct Angio Chest Pe W And/or Wo Contrast  Result Date: 03/08/2018 CLINICAL DATA:  Dyspnea x1 week EXAM: CT ANGIOGRAPHY CHEST WITH CONTRAST TECHNIQUE: Multidetector CT imaging of the chest was performed using the standard protocol during bolus administration of intravenous contrast. Multiplanar CT image reconstructions and MIPs were obtained to evaluate the vascular anatomy. CONTRAST:  119mL ISOVUE-370 IOPAMIDOL (ISOVUE-370) INJECTION 76% COMPARISON:  CXR from earlier on the same day and 06/15/2017, chest CT 04/12/2014 FINDINGS: Cardiovascular: Stable cardiomegaly without pericardial effusion or thickening. Satisfactory opacification of the pulmonary arteries to the segmental  level without acute pulmonary embolus. Ectatic thoracic aorta to 3.8 cm along the ascending portion. Conventional branch pattern of the great vessels with minimal atherosclerosis. Scattered coronary arteriosclerosis is identified. Mediastinum/Nodes: Midline patent trachea and mainstem bronchi. No pathologically enlarged mediastinal, hilar, axillary or supraclavicular nodes. No thyromegaly or visualized mass. Lungs/Pleura: Small to moderate right effusion with adjacent atelectasis. Minimal atelectasis at the left lung base. Central vascular congestion mild ground-glass opacities noted throughout both aerated lungs compatible with stigmata of CHF. Streaky subsegmental atelectasis and/or scarring is seen at each base and along the fissures. Upper Abdomen: Reflux of contrast into the hepatic veins compatible with a component of right heart failure. Musculoskeletal: No chest wall abnormality. No acute or significant osseous findings.  Review of the MIP images confirms the above findings. IMPRESSION: 1. No acute pulmonary embolus. 2. Small to moderate right pleural effusion with adjacent atelectasis. 3. Cardiomegaly with diffuse ground-glass opacities of the lungs bilaterally. This in conjunction with the pleural effusions would be in keeping with mild CHF. Aortic Atherosclerosis (ICD10-I70.0). Electronically Signed   By: Ashley Royalty M.D.   On: 03/08/2018 21:11    Assessment and Plan:   1. Acute on chronic diastolic heart failure -Patient with several years of diastolic heart failure.  Most recent right heart cath in 2017 showed normal right and left heart filling pressures and no pulmonary hypertension.  -BNP 497.1 -CXR showed cardiomegaly with interstitial and alveolar pulmonary edema, small bilateral effusions.  -CT chest showed cardiomegaly, small-moderate right pleural effusion with adjacent atelectasis, left base atelectasis, central vascular congestion compatible with CHF, negative for PE. Scattered  coronary arterial sclerosis identified on CTA of the chest -Echocardiogram showed EF 50-55%, mild MR, mod-severe MR, mod TR, PA peak pressure 39 mmHg, not sufficient to allow evaluation of diastolic function.  -Patient was ordered Lasix 40 mg twice daily as an outpatient, however she admits that she was maybe taking 1 dose once or twice a week. -Diuresing with Lasix 40 mg IV twice daily with good urine output. Net negative 1.9L fluid balance since admission.  - No wt in 2 days. I will order daily wts.  -Pt still has dyspnea on minimal exertion and orthopnea. No edema. Mild JVD. Shortness of breath appears out of proportion to her volume status.   2.  Atrial fibrillation -Patient has history of atrial flutter status post ablation in 04/2016 -Initially atrial flutter with RVR. Treated with cardizem drip, now off.  Currently atrial fib. May be contributing to her symptoms.  -Possibly related to cocaine use -She was given cardizem 20 mg IV today and rate is currently 70's -90's -Will start cardizem 30 mg po q 8h. For rate control. -CHA2DS2/VAS Stroke Risk Score 6 (CHF, Vasc Dz, HTN, CVA (2), female). Will start Xarelto (pharmacy consult for dosing r/t renal function). -Will try to arrange for TEE/DCCV tomorrow as this may help her symptoms. The risks and benefits of transesophageal echocardiogram have been explained including risks of esophageal damage, perforation (1:10,000 risk), bleeding, pharyngeal hematoma as well as other potential complications associated with conscious sedation including aspiration, arrhythmia, respiratory failure and death. Alternatives to treatment were discussed, questions were answered. Patient is willing to proceed.  -Pt will need to be compliant with A/C for 4 weeks post cardioversion.    3. AKI -Serum creatinine 1.18 on admission, 1.50 today. Likely related to diuresis.  4.  Hypertension -Home medications include amlodipine 10 mg, lisinopril 10 mg currently on hold.    -BP is controlled  5. CAD -Left heart cath (12/2010): 30% mid LAD, 30% ostial D1, 50% ostial D2.  -Most recent testing done at [facility] 11/30/2015: No reversible perfusion defects, normal LV EF, coronary artery atherosclerotic calcification seen on CT portion of the exam -Pt treated with aspirin and statin -No new chest pain  6. Mitral regurgitation -Moderate-severe on echo done yesterday, Valve thicker than on prior studies.  -Pt denies IV drug use. -Will check Sed rate and blood cultures -Can look at valve during TEE for cardioversion.   7. Pulmonary hypertension -Right heart cath 03/2015 showed  Near-normal right and left heart filling pressures, no pulmonary hypertension.  -Echo done yesterday showed PA peak pressure 39 mmHg, mild  8. Hyperlipidemia -Last lipid panel 11/30/2015,  LDL 76 -Continue atorvastatin 40 mg   9. Tricuspid regurgitation -Moderate on echo done yesterday  10. Substance abuse -Urine drug screen negative, has been positive for cocaine in the past -Patient admits to using cocaine just prior to the initiation of her symptoms 2 weeks ago.  She reports daily use for the week prior to the symptoms.  She typically smokes cocaine daily for a couple of weeks and then off for a couple weeks. -She tells me that this episode of difficulty breathing has caused her to reevaluate and she plans to stop using cocaine.  11. Tobacco abuse -Patient has been trying to quit and was down to 2 cigarettes/day when her symptoms began.  She has not had a cigarette in 2 weeks and she plans complete cessation.  12. OSA  -Patient has CPAP but she does not use it -Encouraged to begin use of CPAP regularly  13. History of stroke -Patient is on statin and aspirin 325 mg daily. Will stop aspirin with initiation of anticoagulation.      For questions or updates, please contact [place] Please consult [email or web address] for contact info under   Signed, Daune Perch, NP   03/10/2018 3:18 PM

## 2018-03-10 NOTE — Progress Notes (Addendum)
ANTICOAGULATION CONSULT NOTE - Initial Consult  Pharmacy Consult for Xarelto Indication: atrial fibrillation  Allergies  Allergen Reactions  . Ciprofloxacin Itching    Patient Measurements: Height: 5\' 6"  (167.6 cm) Weight: 166 lb 9.6 oz (75.6 kg) IBW/kg (Calculated) : 59.3  Vital Signs: Temp: 97.8 F (36.6 C) (12/19 1201) Temp Source: Oral (12/19 1201) BP: 111/97 (12/19 0727) Pulse Rate: 87 (12/19 1201)  Labs: Recent Labs    03/08/18 1745 03/09/18 0318 03/10/18 0325  HGB 12.4  --   --   HCT 41.4  --   --   PLT 150  --   --   CREATININE 1.18* 1.26* 1.50*    Estimated Creatinine Clearance: 44.5 mL/min (A) (by C-G formula based on SCr of 1.5 mg/dL (H)).   Medical History: Past Medical History:  Diagnosis Date  . Anemia of other chronic disease 11/13/2013  . Anginal pain ([place])    none in past year  . Anxiety   . Asthma   . Candidiasis 06/23/2013  . Chronic diastolic CHF (congestive heart failure) ([place])   . Constipation   . Coronary artery disease    Lexiscan myoview (10/12) with significant ST depression upon Lexiscan injection but no ischemia or infarction on perfusion images. Left heart cath (10/12): 30% mid LAD, 30% ostial D1, 50% ostial D2.    [name] Cough 11/13/2013  . DCIS (ductal carcinoma in situ) of breast   . Depression   . Headache 02/22/2014  . Hx of cardiovascular stress test    Lexiscan Myoview (2/16): Breast attenuation artifact, no ischemia, EF 64%; low risk  . Hyperlipemia   . Hypertension   . Iron deficiency    hx of  . Leukocytoclastic vasculitis ([place])    Rash across lower body, occurred in 9/11, diagnosed by skin biopsy. ANA positive. Thought to be secondary to cocaine use.  . Leukopenia 03/21/2013  . Lymphadenopathy 03/21/2013  . Lymphadenopathy 01/30/2014  . Mental disorder   . Pancytopenia ([place])   . Pancytopenia, acquired ([place]) 07/16/2015  . Polysubstance abuse ([place])    Prior cocaine  . Pulmonary HTN ([place])    Echo (9/11) with EF 65%, mild  LVH, mild AI, mild MR, moderate to possibly severe TR with PA systolic pressure 52 mmHg. Echo (10/12): severe LV hypertrophy, EF 60-65%, mild MR, mild AI, moderate to severe tricuspid regurgitation, PA systolic pressure 38 mmHg.  Echo 4/14: moderate LVH, EF 65%, normal wall motion, diastolic dysfunction, mild AI, mild MR  . Shortness of breath    a. PFTs 5/14:  FEV1/FVC 99% predicted; FVC 60% predicted; DLCO mildly reduced, mild restriction, air trapping.;  b.  seen by pulmo (Dr. [name]) 5/14: mainly upper airway symptoms - ACE d/c'd (sx's better)  . Stroke ([place]) 2010ish  . Syphilis 04/25/2013  . Tobacco abuse   . Tricuspid regurgitation   . Unspecified deficiency anemia 03/29/2013    Medications:  Medications Prior to Admission  Medication Sig Dispense Refill Last Dose  . albuterol (PROVENTIL HFA;VENTOLIN HFA) 108 (90 Base) MCG/ACT inhaler Inhale 2 puffs into the lungs every 6 (six) hours as needed for wheezing or shortness of breath. 54 Inhaler 3 03/08/2018 at Unknown time  . amLODipine (NORVASC) 10 MG tablet Take 1 tablet (10 mg total) by mouth daily. 90 tablet 3 Past Week at Unknown time  . aspirin EC 325 MG tablet Take 325 mg by mouth daily.   Past Week at Unknown time  . atorvastatin (LIPITOR) 40 MG tablet TAKE 1 TABLET BY MOUTH  IN THE EVENING (Patient taking differently: Take 40 mg by mouth daily at 6 PM. ) 30 tablet 2 Past Week at Unknown time  . furosemide (LASIX) 20 MG tablet Take 2 tablets (40 mg total) by mouth 2 (two) times daily. (Patient taking differently: Take 20-40 mg by mouth 2 (two) times daily. Take 2 tablets every morning and 1 tablet every evening) 30 tablet 3 Past Week at Unknown time  . ranitidine (ZANTAC) 150 MG tablet Take 1 tablet (150 mg total) by mouth 2 (two) times daily. 60 tablet 2 Past Week at Unknown time  . topiramate (TOPAMAX) 25 MG tablet Take 2 tablets at night (Patient taking differently: Take 50 mg by mouth at bedtime. ) 60 tablet 6 Past Week at Unknown time   . traZODone (DESYREL) 150 MG tablet Take 150 mg by mouth at bedtime.  0 Past Week at Unknown time  . citalopram (CELEXA) 40 MG tablet Take 1 tablet (40 mg total) by mouth daily. (Patient not taking: Reported on 03/09/2018) 30 tablet 1 Not Taking at Unknown time  . lisinopril (PRINIVIL,ZESTRIL) 10 MG tablet Take 1 tablet (10 mg total) by mouth daily. (Patient not taking: Reported on 03/09/2018) 90 tablet 3 Not Taking at Unknown time  . loratadine (CLARITIN) 10 MG tablet Take 1 tablet (10 mg total) by mouth daily. (Patient not taking: Reported on 03/09/2018) 30 tablet 3 Not Taking at Unknown time    Assessment: 52 y.o. F presents with acute on chronic diastolic HF. Pharmacy consulted to start Xarelto for afib. Pt not on AC PTA.   SCr up to 1.5 (admission 1.18), est CrCl 45 ml/min. CBC ok on admssion.   Goal of Therapy:  Prevention of CVA Monitor platelets by anticoagulation protocol: Yes   Plan:  Xarelto 15mg  po daily with supper F/u CBC  Decrease dose of ASA to 81mg  (will d/w Dr. Karleen Hampshire) D/c Lovenox  Sherlon Handing, PharmD, BCPS Clinical pharmacist  **Pharmacist phone directory can now be found on [email or web address] (PW [email or web address]).  Listed under [place]. 03/10/2018,2:51 PM

## 2018-03-10 NOTE — Progress Notes (Signed)
PROGRESS NOTE    Morgan Bean  ZOX:[id number] DOB: [birth year] DOA: 03/08/2018 PCP: Patient, No Pcp Per    Brief Narrative:Morgan Bean is a 52 y.o. female with medical history significant for polysubstance abuse, depression with anxiety, chronic diastolic CHF, pulmonary hypertension, coronary artery disease, history of CVA, hypertension, and COPD, now presenting to the emergency department for evaluation of shortness of breath and orthopnea.  The patient reports that the symptoms developed over the past week and have been worsening.Chest x-ray is notable for cardiomegaly with interstitial and alveolar pulmonary edema.  Assessment & Plan:   Principal Problem:   Acute on chronic diastolic CHF (congestive heart failure) ([name]) Active Problems:   Major depressive disorder, recurrent episode with psychotic features.   History of cocaine abuse ([place])   Polysubstance abuse ([place])   History of stroke   COPD with chronic bronchitis ([place])   Hypertensive urgency   Atrial tachycardia ([name])   Acute respiratory failure with hypoxia ([name])   Persistent atrial fibrillation   CHF exacerbation ([name])   Acute pulmonary edema ([name])   Atrial fibrillation with RVR ([name])   Nonrheumatic mitral valve regurgitation   Acute respiratory failure with hypoxia secondary to acute on chronic diastolic heart failure Admitted for IV diuresis with IV Lasix 40 mg daily monitor urine output and strict intake and output and daily weights.  Diuresed about 2300 mL of overnight. Watch renal parameters while on IV Lasix. CTA negative for PE but findings consistent with acute CHF. Echocardiogram reviewed.  Cardiology consulted for recommendations.   Atrial tachycardia Patient was started on IV Cardizem gtt. and transition to oral Cardizem this morning by cardiology.  TSH within normal limits.  However possibly precipitated by cocaine use. Troponin negative at this time. Patient denies any chest pain. Plan for TEE/DCCV this  admission.  She was started on anticoagulation by cardiology.    History of polysubstance abuse  counseled towards cessation Will need social worker consult for resources.   Hypertensive urgency Resolved    Depression, anxiety and insomnia Continue with Wellbutrin and Celexa and trazodone.    History of nonhemorrhagic CVA Continue with aspirin and statin.   History of COPD Stable no wheezing heard on exam.   Hypokalemia Replaced.    DVT prophylaxis: Lovenox Code Status: Full code Family Communication: Discussed with family at bedside Disposition Plan: Pending clinical improvement   Consultants:   Cardiology Dr. Oval Linsey   Procedures: Echocardiogram  Antimicrobials: None  Subjective: Patient reports persistent dyspnea, tachypnea and she also reports palpitations  Objective: Vitals:   03/10/18 0932 03/10/18 1201 03/10/18 1541 03/10/18 1659  BP:   (!) 128/107 (!) 134/95  Pulse:  87  82  Resp:  17  20  Temp:  97.8 F (36.6 C)  98.3 F (36.8 C)  TempSrc:  Oral  Oral  SpO2: 96%   94%  Weight:      Height:        Intake/Output Summary (Last 24 hours) at 03/10/2018 1756 Last data filed at 03/10/2018 1300 Gross per 24 hour  Intake 480 ml  Output 400 ml  Net 80 ml   Filed Weights   03/08/18 2255 03/08/18 2308  Weight: 75.6 kg 75.6 kg    Examination:  General exam: Continues to have mild distress from shortness of breath Respiratory system: Rales present, diminished at bases, tachypnea present Cardiovascular system: S1 & S2 heard, RRR. No JVD, murmur present Gastrointestinal system: Abdomen is soft, nontender, nondistended, good bowel sounds Central nervous system: Alert and  oriented. No focal neurological deficits. Extremities: Symmetric 5 x 5 power. Skin: No rashes, lesions or ulcers Psychiatry:  Mood & affect appropriate.     Data Reviewed: I have personally reviewed following labs and imaging studies  CBC: Recent Labs  Lab  03/08/18 1745  WBC 5.8  HGB 12.4  HCT 41.4  MCV 79.3*  PLT 735   Basic Metabolic Panel: Recent Labs  Lab 03/08/18 1745 03/09/18 0318 03/10/18 0325  NA 138 138 139  K 3.9 3.3* 3.6  CL 111 110 107  CO2 19* 20* 24  GLUCOSE 115* 94 94  BUN 22* 20 22*  CREATININE 1.18* 1.26* 1.50*  CALCIUM 9.2 8.6* 8.6*  MG  --  2.2  --    GFR: Estimated Creatinine Clearance: 44.5 mL/min (A) (by C-G formula based on SCr of 1.5 mg/dL (H)). Liver Function Tests: No results for input(s): AST, ALT, ALKPHOS, BILITOT, PROT, ALBUMIN in the last 168 hours. No results for input(s): LIPASE, AMYLASE in the last 168 hours. No results for input(s): AMMONIA in the last 168 hours. Coagulation Profile: No results for input(s): INR, PROTIME in the last 168 hours. Cardiac Enzymes: No results for input(s): CKTOTAL, CKMB, CKMBINDEX, TROPONINI in the last 168 hours. BNP (last 3 results) No results for input(s): PROBNP in the last 8760 hours. HbA1C: No results for input(s): HGBA1C in the last 72 hours. CBG: No results for input(s): GLUCAP in the last 168 hours. Lipid Profile: No results for input(s): CHOL, HDL, LDLCALC, TRIG, CHOLHDL, LDLDIRECT in the last 72 hours. Thyroid Function Tests: Recent Labs    03/09/18 0318  TSH 2.563   Anemia Panel: No results for input(s): VITAMINB12, FOLATE, FERRITIN, TIBC, IRON, RETICCTPCT in the last 72 hours. Sepsis Labs: No results for input(s): PROCALCITON, LATICACIDVEN in the last 168 hours.  No results found for this or any previous visit (from the past 240 hour(s)).       Radiology Studies: Dg Chest 2 View  Result Date: 03/08/2018 CLINICAL DATA:  52 y/o  F; 1 week of shortness of breath. EXAM: CHEST - 2 VIEW COMPARISON:  06/15/2017 chest radiograph FINDINGS: Stable cardiomegaly given projection and technique. Calcific aortic atherosclerosis. Diffuse reticular and patchy opacities of the lungs with basilar predominance. Small bilateral pleural effusions  packing along fissures. No pneumothorax. Bones are unremarkable. IMPRESSION: Cardiomegaly with interstitial and alveolar pulmonary edema. Small bilateral effusions. Electronically Signed   By: Kristine Garbe M.D.   On: 03/08/2018 19:35   Ct Angio Chest Pe W And/or Wo Contrast  Result Date: 03/08/2018 CLINICAL DATA:  Dyspnea x1 week EXAM: CT ANGIOGRAPHY CHEST WITH CONTRAST TECHNIQUE: Multidetector CT imaging of the chest was performed using the standard protocol during bolus administration of intravenous contrast. Multiplanar CT image reconstructions and MIPs were obtained to evaluate the vascular anatomy. CONTRAST:  157mL ISOVUE-370 IOPAMIDOL (ISOVUE-370) INJECTION 76% COMPARISON:  CXR from earlier on the same day and 06/15/2017, chest CT 04/12/2014 FINDINGS: Cardiovascular: Stable cardiomegaly without pericardial effusion or thickening. Satisfactory opacification of the pulmonary arteries to the segmental level without acute pulmonary embolus. Ectatic thoracic aorta to 3.8 cm along the ascending portion. Conventional branch pattern of the great vessels with minimal atherosclerosis. Scattered coronary arteriosclerosis is identified. Mediastinum/Nodes: Midline patent trachea and mainstem bronchi. No pathologically enlarged mediastinal, hilar, axillary or supraclavicular nodes. No thyromegaly or visualized mass. Lungs/Pleura: Small to moderate right effusion with adjacent atelectasis. Minimal atelectasis at the left lung base. Central vascular congestion mild ground-glass opacities noted throughout both aerated lungs compatible  with stigmata of CHF. Streaky subsegmental atelectasis and/or scarring is seen at each base and along the fissures. Upper Abdomen: Reflux of contrast into the hepatic veins compatible with a component of right heart failure. Musculoskeletal: No chest wall abnormality. No acute or significant osseous findings. Review of the MIP images confirms the above findings. IMPRESSION: 1. No  acute pulmonary embolus. 2. Small to moderate right pleural effusion with adjacent atelectasis. 3. Cardiomegaly with diffuse ground-glass opacities of the lungs bilaterally. This in conjunction with the pleural effusions would be in keeping with mild CHF. Aortic Atherosclerosis (ICD10-I70.0). Electronically Signed   By: Ashley Royalty M.D.   On: 03/08/2018 21:11        Scheduled Meds: . atorvastatin  40 mg Oral q1800  . buPROPion  150 mg Oral BID  . citalopram  40 mg Oral Daily  . diltiazem  30 mg Oral Q8H  . famotidine  20 mg Oral BID  . [START ON 03/11/2018] furosemide  40 mg Intravenous Daily  . mometasone-formoterol  2 puff Inhalation BID  . rivaroxaban  15 mg Oral Q supper  . sodium chloride flush  3 mL Intravenous Q12H  . topiramate  50 mg Oral QHS  . traZODone  150 mg Oral QHS   Continuous Infusions: . sodium chloride       LOS: 0 days    Time spent: 28 minutes    Hosie Poisson, MD [facility] Pager [phone] If 7PM-7AM, please contact night-coverage [email or web address] Password [facility] 03/10/2018, 5:56 PM

## 2018-03-11 ENCOUNTER — Inpatient Hospital Stay (HOSPITAL_COMMUNITY): Payer: Medicaid Other

## 2018-03-11 ENCOUNTER — Inpatient Hospital Stay (HOSPITAL_COMMUNITY): Payer: Medicaid Other | Admitting: Anesthesiology

## 2018-03-11 ENCOUNTER — Encounter (HOSPITAL_COMMUNITY)
Admission: EM | Disposition: A | Discharge status: Home Health | Payer: Self-pay | Source: Home / Self Care | Attending: Internal Medicine

## 2018-03-11 ENCOUNTER — Encounter (HOSPITAL_COMMUNITY): Payer: Self-pay | Admitting: *Deleted

## 2018-03-11 DIAGNOSIS — Z8673 Personal history of transient ischemic attack (TIA), and cerebral infarction without residual deficits: Secondary | ICD-10-CM

## 2018-03-11 DIAGNOSIS — I4891 Unspecified atrial fibrillation: Secondary | ICD-10-CM

## 2018-03-11 DIAGNOSIS — I160 Hypertensive urgency: Secondary | ICD-10-CM

## 2018-03-11 HISTORY — PX: TEE WITHOUT CARDIOVERSION: SHX5443

## 2018-03-11 HISTORY — PX: CARDIOVERSION: SHX1299

## 2018-03-11 LAB — BASIC METABOLIC PANEL
ANION GAP: 9 (ref 5–15)
BUN: 21 mg/dL — ABNORMAL HIGH (ref 6–20)
CO2: 20 mmol/L — ABNORMAL LOW (ref 22–32)
Calcium: 8.7 mg/dL — ABNORMAL LOW (ref 8.9–10.3)
Chloride: 109 mmol/L (ref 98–111)
Creatinine, Ser: 1.43 mg/dL — ABNORMAL HIGH (ref 0.44–1.00)
GFR calc non Af Amer: 41 mL/min — ABNORMAL LOW (ref >60)
GFR, EST AFRICAN AMERICAN: 48 mL/min — AB (ref >60)
Glucose, Bld: 90 mg/dL (ref 70–99)
Potassium: 3.8 mmol/L (ref 3.5–5.1)
Sodium: 138 mmol/L (ref 135–145)

## 2018-03-11 LAB — CBC
HCT: 37 % (ref 36.0–46.0)
Hemoglobin: 11.1 g/dL — ABNORMAL LOW (ref 12.0–15.0)
MCH: 23.7 pg — ABNORMAL LOW (ref 26.0–34.0)
MCHC: 30 g/dL (ref 30.0–36.0)
MCV: 79.1 fL — ABNORMAL LOW (ref 80.0–100.0)
NRBC: 0 % (ref 0.0–0.2)
Platelets: 156 10*3/uL (ref 150–400)
RBC: 4.68 MIL/uL (ref 3.87–5.11)
RDW: 18.3 % — ABNORMAL HIGH (ref 11.5–15.5)
WBC: 5.2 10*3/uL (ref 4.0–10.5)

## 2018-03-11 SURGERY — ECHOCARDIOGRAM, TRANSESOPHAGEAL
Anesthesia: Monitor Anesthesia Care

## 2018-03-11 MED ORDER — INFLUENZA VAC SPLIT QUAD 0.5 ML IM SUSY
0.5000 mL | PREFILLED_SYRINGE | INTRAMUSCULAR | Status: AC
  Administered 2018-03-12: 0.5 mL via INTRAMUSCULAR
  Filled 2018-03-11: qty 0.5

## 2018-03-11 MED ORDER — RIVAROXABAN 20 MG PO TABS
20.0000 mg | ORAL_TABLET | Freq: Every day | ORAL | Status: DC
  Administered 2018-03-11 – 2018-03-14 (×4): 20 mg via ORAL
  Filled 2018-03-11 (×4): qty 1

## 2018-03-11 MED ORDER — PROPOFOL 500 MG/50ML IV EMUL
INTRAVENOUS | Status: DC | PRN
  Administered 2018-03-11: 100 ug/kg/min via INTRAVENOUS

## 2018-03-11 MED ORDER — LIDOCAINE 2% (20 MG/ML) 5 ML SYRINGE
INTRAMUSCULAR | Status: DC | PRN
  Administered 2018-03-11: 40 mg via INTRAVENOUS
  Administered 2018-03-11: 20 mg via INTRAVENOUS

## 2018-03-11 MED ORDER — SODIUM CHLORIDE 0.9 % IV SOLN
INTRAVENOUS | Status: DC | PRN
  Administered 2018-03-11: 14:00:00 via INTRAVENOUS

## 2018-03-11 MED ORDER — DILTIAZEM HCL ER COATED BEADS 120 MG PO CP24
120.0000 mg | ORAL_CAPSULE | Freq: Every day | ORAL | Status: DC
  Administered 2018-03-12 – 2018-03-14 (×3): 120 mg via ORAL
  Filled 2018-03-11 (×3): qty 1

## 2018-03-11 MED ORDER — PROPOFOL 10 MG/ML IV BOLUS
INTRAVENOUS | Status: DC | PRN
  Administered 2018-03-11 (×2): 40 mg via INTRAVENOUS
  Administered 2018-03-11: 20 mg via INTRAVENOUS

## 2018-03-11 NOTE — CV Procedure (Signed)
Cardioversion  Patient sedated by anesthesia for TEE  With pads in apex base position, pt cardioverted to SR with 200 J synchronized biphasic energy.   Frequent PACs  Procedure is without complication.  Morgan Bean

## 2018-03-11 NOTE — Progress Notes (Signed)
[place] for Xarelto Indication: atrial fibrillation  Allergies  Allergen Reactions  . Ciprofloxacin Itching    Patient Measurements: Height: 5\' 6"  (167.6 cm) Weight: 166 lb 9.6 oz (75.6 kg) IBW/kg (Calculated) : 59.3  Vital Signs: Temp: 97.8 F (36.6 C) (12/20 0624) Temp Source: Oral (12/20 0624) BP: 117/84 (12/20 0624) Pulse Rate: 92 (12/20 0624)  Labs: Recent Labs    03/08/18 1745 03/09/18 0318 03/10/18 0325 03/11/18 0456  HGB 12.4  --   --  11.1*  HCT 41.4  --   --  37.0  PLT 150  --   --  156  CREATININE 1.18* 1.26* 1.50* 1.43*    Estimated Creatinine Clearance: 46.7 mL/min (A) (by C-G formula based on SCr of 1.43 mg/dL (H)).  Assessment: 52 y.o. F presents with acute on chronic diastolic HF. Pharmacy consulted to start Xarelto for afib. Plan for TEE/DCCV today with Xarelto for a month post-procedure.  SCr now down to 1.43 (admission 1.18),estimated CrCl using TBW ~52 ml/min. Hgb down to 11.1, plt wnl. No bleeding noted.   Goal of Therapy:  Prevention of CVA Monitor platelets by anticoagulation protocol: Yes   Plan:  Change Xarelto to 20mg  po daily with supper Will continue to follow renal function and CBC   Sherlon Handing, PharmD, BCPS Clinical pharmacist  **Pharmacist phone directory can now be found on [email or web address] (PW [email or web address]).  Listed under [place]. 03/11/2018,9:44 AM

## 2018-03-11 NOTE — Progress Notes (Signed)
  Echocardiogram Echocardiogram Transesophageal has been performed.  Morgan Bean 03/11/2018, 2:39 PM

## 2018-03-11 NOTE — Transfer of Care (Signed)
Immediate Anesthesia Transfer of Care Note  Patient: Morgan Bean  Procedure(s) Performed: TRANSESOPHAGEAL ECHOCARDIOGRAM (TEE) (N/A ) CARDIOVERSION (N/A )  Patient Location: PACU  Anesthesia Type:MAC  Level of Consciousness: awake, alert , oriented and patient cooperative  Airway & Oxygen Therapy: Patient Spontanous Breathing and Patient connected to nasal cannula oxygen  Post-op Assessment: Report given to RN and Post -op Vital signs reviewed and stable  Post vital signs: Reviewed and stable  Last Vitals:  Vitals Value Taken Time  BP    Temp    Pulse 92 03/11/2018  2:30 PM  Resp 35 03/11/2018  2:30 PM  SpO2 92 % 03/11/2018  2:30 PM  Vitals shown include unvalidated device data.  Last Pain:  Vitals:   03/11/18 1255  TempSrc: Oral  PainSc: 0-No pain      Patients Stated Pain Goal: 0 (63/14/97 0263)  Complications: No apparent anesthesia complications

## 2018-03-11 NOTE — Interval H&P Note (Signed)
History and Physical Interval Note:  03/11/2018 12:45 PM  Morgan Bean  has presented today for surgery, with the diagnosis of a fib  The various methods of treatment have been discussed with the patient and family. After consideration of risks, benefits and other options for treatment, the patient has consented to  Procedure(s): TRANSESOPHAGEAL ECHOCARDIOGRAM (TEE) (N/A) CARDIOVERSION (N/A) as a surgical intervention .  The patient's history has been reviewed, patient examined, no change in status, stable for surgery.  I have reviewed the patient's chart and labs.  Questions were answered to the patient's satisfaction.     Dorris Carnes

## 2018-03-11 NOTE — Progress Notes (Signed)
PROGRESS NOTE    Morgan Bean  ZHG:[id number] DOB: 16-Dec-1965 DOA: 03/08/2018 PCP: Patient, No Pcp Per    Brief Narrative:Morgan Bean is a 52 y.o. female with medical history significant for polysubstance abuse, depression with anxiety, chronic diastolic CHF, pulmonary hypertension, coronary artery disease, history of CVA, hypertension, and COPD, now presenting to the emergency department for evaluation of shortness of breath and orthopnea.  The patient reports that the symptoms developed over the past week and have been worsening.Chest x-ray is notable for cardiomegaly with interstitial and alveolar pulmonary edema.  Assessment & Plan:   Principal Problem:   Acute on chronic diastolic CHF (congestive heart failure) ([name]) Active Problems:   Major depressive disorder, recurrent episode with psychotic features.   History of cocaine abuse ([place])   Polysubstance abuse ([place])   History of stroke   COPD with chronic bronchitis ([place])   Hypertensive urgency   Atrial tachycardia ([name])   Acute respiratory failure with hypoxia ([name])   Persistent atrial fibrillation   CHF exacerbation ([name])   Acute pulmonary edema ([name])   Atrial fibrillation with RVR ([name])   Nonrheumatic mitral valve regurgitation   Acute respiratory failure with hypoxia secondary to acute on chronic diastolic heart failure Admitted for IV diuresis with IV Lasix 40 mg daily  monitor urine output, with strict intake and output and daily weights.   Watch renal parameters while on IV Lasix, diuresed about a liter last 24 hours CTA negative for PE but findings consistent with acute CHF. Echocardiogram reviewed.  Cardiology consulted for recommendations.   Atrial tachycardia Patient was started on IV Cardizem gtt. and transition to oral Cardizemby cardiology.  TSH within normal limits.   Troponin negative at this time. Patient denies any chest pain. Plan for TEE/DCCV today and she was started on anticoagulation by  cardiology.    History of polysubstance abuse  counseled towards cessation Will need social worker consult for resources.   Hypertensive urgency Resolved    Depression, anxiety and insomnia Continue with Wellbutrin and Celexa and trazodone.    History of nonhemorrhagic CVA Continue with aspirin and statin.   History of COPD Stable no wheezing heard on exam.   Hypokalemia Replaced.    DVT prophylaxis: Lovenox Code Status: Full code Family Communication: Discussed with family at bedside Disposition Plan: Pending clinical improvement, DCCV/TEE   Consultants:   Cardiology Dr. Oval Linsey   Procedures: Echocardiogram DCCV/TEE  Antimicrobials: None  Subjective: Dyspnea has improved but not completely resolved.  Tachycardia has improved.  No chest pain No nausea vomiting, headache or abdominal pain  Objective: Vitals:   03/11/18 1430 03/11/18 1440 03/11/18 1450 03/11/18 1607  BP: 119/90 (!) 127/102 (!) 140/102 (!) 112/94  Pulse: 93 90 88 84  Resp: (!) 31 (!) 24 (!) 28 (!) 24  Temp: 98 F (36.7 C)   99.2 F (37.3 C)  TempSrc: Oral   Oral  SpO2: 93% 98% 100% 92%  Weight:      Height:        Intake/Output Summary (Last 24 hours) at 03/11/2018 1615 Last data filed at 03/11/2018 1416 Gross per 24 hour  Intake 1020 ml  Output -  Net 1020 ml   Filed Weights   03/08/18 2255 03/08/18 2308 03/11/18 1255  Weight: 75.6 kg 75.6 kg 75.6 kg    Examination:  General exam: Alert and comfortable Respiratory system: Air entry diminished at bases, no wheezing or rhonchi Cardiovascular system: S1 & S2 heard, RRR. No JVD, murmur present Gastrointestinal system: Abdomen  is soft, nontender, nondistended, good bowel sounds. Central nervous system: Alert and oriented.  Grossly nonfocal Extremities: Symmetric 5 x 5 power. Skin: No rashes, lesions or ulcers Psychiatry:  Mood & affect appropriate.     Data Reviewed: I have personally reviewed following labs and  imaging studies  CBC: Recent Labs  Lab 03/08/18 1745 03/11/18 0456  WBC 5.8 5.2  HGB 12.4 11.1*  HCT 41.4 37.0  MCV 79.3* 79.1*  PLT 150 557   Basic Metabolic Panel: Recent Labs  Lab 03/08/18 1745 03/09/18 0318 03/10/18 0325 03/11/18 0456  NA 138 138 139 138  K 3.9 3.3* 3.6 3.8  CL 111 110 107 109  CO2 19* 20* 24 20*  GLUCOSE 115* 94 94 90  BUN 22* 20 22* 21*  CREATININE 1.18* 1.26* 1.50* 1.43*  CALCIUM 9.2 8.6* 8.6* 8.7*  MG  --  2.2  --   --    GFR: Estimated Creatinine Clearance: 46.7 mL/min (A) (by C-G formula based on SCr of 1.43 mg/dL (H)). Liver Function Tests: No results for input(s): AST, ALT, ALKPHOS, BILITOT, PROT, ALBUMIN in the last 168 hours. No results for input(s): LIPASE, AMYLASE in the last 168 hours. No results for input(s): AMMONIA in the last 168 hours. Coagulation Profile: No results for input(s): INR, PROTIME in the last 168 hours. Cardiac Enzymes: No results for input(s): CKTOTAL, CKMB, CKMBINDEX, TROPONINI in the last 168 hours. BNP (last 3 results) No results for input(s): PROBNP in the last 8760 hours. HbA1C: No results for input(s): HGBA1C in the last 72 hours. CBG: No results for input(s): GLUCAP in the last 168 hours. Lipid Profile: No results for input(s): CHOL, HDL, LDLCALC, TRIG, CHOLHDL, LDLDIRECT in the last 72 hours. Thyroid Function Tests: Recent Labs    03/09/18 0318  TSH 2.563   Anemia Panel: No results for input(s): VITAMINB12, FOLATE, FERRITIN, TIBC, IRON, RETICCTPCT in the last 72 hours. Sepsis Labs: No results for input(s): PROCALCITON, LATICACIDVEN in the last 168 hours.  Recent Results (from the past 240 hour(s))  Culture, blood (routine x 2)     Status: None (Preliminary result)   Collection Time: 03/10/18  2:54 PM  Result Value Ref Range Status   Specimen Description BLOOD LEFT ANTECUBITAL  Final   Special Requests   Final    BOTTLES DRAWN AEROBIC ONLY Blood Culture adequate volume   Culture   Final     NO GROWTH < 24 HOURS Performed at [facility], [address], [place], [place] [address]    Report Status PENDING  Incomplete  Culture, blood (routine x 2)     Status: None (Preliminary result)   Collection Time: 03/10/18  2:59 PM  Result Value Ref Range Status   Specimen Description BLOOD RIGHT HAND  Final   Special Requests   Final    BOTTLES DRAWN AEROBIC ONLY Blood Culture results may not be optimal due to an inadequate volume of blood received in culture bottles   Culture   Final    NO GROWTH < 24 HOURS Performed at [facility], [place] [address], [place], [place] [address]    Report Status PENDING  Incomplete         Radiology Studies: No results found.      Scheduled Meds: . atorvastatin  40 mg Oral q1800  . buPROPion  150 mg Oral BID  . citalopram  40 mg Oral Daily  . [START ON 03/12/2018] diltiazem  120 mg Oral Daily  .  diltiazem  30 mg Oral Q8H  . famotidine  20 mg Oral BID  . furosemide  40 mg Intravenous Daily  . [START ON 03/12/2018] Influenza vac split quadrivalent PF  0.5 mL Intramuscular Tomorrow-1000  . mometasone-formoterol  2 puff Inhalation BID  . rivaroxaban  20 mg Oral Q supper  . sodium chloride flush  3 mL Intravenous Q12H  . topiramate  50 mg Oral QHS  . traZODone  150 mg Oral QHS   Continuous Infusions: . sodium chloride       LOS: 1 day    Time spent: 26 minutes    Hosie Poisson, MD [facility] Pager [phone] If 7PM-7AM, please contact night-coverage [email or web address] Password [facility] 03/11/2018, 4:15 PM

## 2018-03-11 NOTE — H&P (View-Only) (Signed)
Progress Note  Patient Name: Morgan Bean Date of Encounter: 03/11/2018  Primary Cardiologist: [name], MD - New  Subjective   Pt remains SOB with talking. Still in AF with rate controlled in the 80's. Anticipate TEE/DCCV today   Inpatient Medications    Scheduled Meds: . atorvastatin  40 mg Oral q1800  . buPROPion  150 mg Oral BID  . citalopram  40 mg Oral Daily  . diltiazem  30 mg Oral Q8H  . famotidine  20 mg Oral BID  . furosemide  40 mg Intravenous Daily  . mometasone-formoterol  2 puff Inhalation BID  . rivaroxaban  15 mg Oral Q supper  . sodium chloride flush  3 mL Intravenous Q12H  . topiramate  50 mg Oral QHS  . traZODone  150 mg Oral QHS   Continuous Infusions: . sodium chloride    . sodium chloride 20 mL/hr at 03/11/18 0620   PRN Meds: sodium chloride, acetaminophen, hydrALAZINE, hydrOXYzine, ondansetron (ZOFRAN) IV, sodium chloride flush   Vital Signs    Vitals:   03/10/18 1946 03/10/18 2200 03/11/18 0620 03/11/18 0624  BP: (!) 125/103 111/89 117/84 117/84  Pulse: (!) 101   92  Resp:    (!) 22  Temp: 98.5 F (36.9 C)   97.8 F (36.6 C)  TempSrc: Oral   Oral  SpO2: 97%   100%  Weight:      Height:        Intake/Output Summary (Last 24 hours) at 03/11/2018 0720 Last data filed at 03/10/2018 2100 Gross per 24 hour  Intake 1060 ml  Output -  Net 1060 ml   Filed Weights   03/08/18 2255 03/08/18 2308  Weight: 75.6 kg 75.6 kg    Physical Exam   General: frail, AA female, NAD Skin: Warm, dry, intact  Head: Normocephalic, atraumatic, clear, moist mucus membranes. Neck: Negative for carotid bruits. No JVD Lungs:Clear to ausculation bilaterally. No wheezes, rales, or rhonchi. Breathing is mildly labored with talking Cardiovascular: Irregularly irregular with S1 S2. + murmurs. No rubs, gallops, or LV heave appreciated. Abdomen: Soft, non-tender, non-distended with normoactive bowel sounds. No obvious abdominal masses. MSK:  Strength and tone appear normal for age. 5/5 in all extremities Extremities: No edema. No clubbing or cyanosis. DP/PT pulses 2+ bilaterally Neuro: Alert and oriented. No focal deficits. No facial asymmetry. MAE spontaneously. Psych: Responds to questions appropriately with normal affect.    Labs    Chemistry Recent Labs  Lab 03/09/18 0318 03/10/18 0325 03/11/18 0456  NA 138 139 138  K 3.3* 3.6 3.8  CL 110 107 109  CO2 20* 24 20*  GLUCOSE 94 94 90  BUN 20 22* 21*  CREATININE 1.26* 1.50* 1.43*  CALCIUM 8.6* 8.6* 8.7*  GFRNONAA 48* 39* 41*  GFRAA 56* 45* 48*  ANIONGAP 8 8 9     Hematology Recent Labs  Lab 03/08/18 1745 03/11/18 0456  WBC 5.8 5.2  RBC 5.22* 4.68  HGB 12.4 11.1*  HCT 41.4 37.0  MCV 79.3* 79.1*  MCH 23.8* 23.7*  MCHC 30.0 30.0  RDW 18.8* 18.3*  PLT 150 156   Cardiac EnzymesNo results for input(s): TROPONINI in the last 168 hours.  Recent Labs  Lab 03/08/18 1752  TROPIPOC 0.03    BNP Recent Labs  Lab 03/08/18 1746  BNP 497.1*    DDimer  Recent Labs  Lab 03/08/18 1745  DDIMER 2.00*    Radiology    No results found.  Telemetry  03/11/18 AF with HR 80's - Personally Reviewed  ECG    No new tracing as of 03/11/18- Personally Reviewed  Cardiac Studies   Echocardiogram 03/09/18:  Study Conclusions  - Left ventricle: The cavity size was normal. Wall thickness was   normal. Systolic function was normal. The estimated ejection   fraction was in the range of 50% to 55%. The study is not   technically sufficient to allow evaluation of LV diastolic   function. - Aortic valve: There was mild regurgitation. - Mitral valve: There was moderate to severe regurgitation. - Left atrium: The atrium was severely dilated. - Atrial septum: No defect or patent foramen ovale was identified. - Tricuspid valve: There was moderate regurgitation. - Pulmonary arteries: PA peak pressure: 39 mm Hg (S).  Echocardiogram 03/09/18 Study Conclusions -  Left ventricle: The cavity size was normal. Wall thickness was normal. Systolic function was normal. The estimated ejection fraction was in the range of 50% to 55%. The study is not technically sufficient to allow evaluation of LV diastolic function. - Aortic valve: There was mild regurgitation. - Mitral valve: There was moderate to severe regurgitation. - Left atrium: The atrium was severely dilated. - Atrial septum: No defect or patent foramen ovale was identified. - Tricuspid valve: There was moderate regurgitation. - Pulmonary arteries: PA peak pressure: 39 mm Hg (S).  Right heart catheterization 04/23/2015: Near-normal right and left heart filling pressures, no pulmonary hypertension.  No evidence for hemodynamically significant TR.  She can continue the same Lasix dose with no change.  I suspect that COPD may be the major cause of her dyspnea at this point.  2. Cardiac output lower than I would expect.   Patient Profile     52 y.o. female with a hx of polysubstance abuse, depression with anxiety, chronic diastolic CHF, pulmonary hypertension, CAD, history of CVA, atrial flutter s/p ablation, sleep apnea and COPD who is being seen today for the evaluation of CHF and atrial tachycardia at the request of Dr. Karleen Hampshire.  Assessment & Plan   1. Acute on chronic diastolic heart failure: -Most recent echocardiogram showed EF 50-55%, mild MR, mod-severe MR, mod TR, PA peak pressure 39 mmHg, not sufficient to allow evaluation of diastolic function.  -Prescribed Lasix 40 mg twice daily as an outpatient, however pt was noncompliant with medications and not taking -Continue with IV Lasix 40 mg twice daily and monitor response -Continue with daily weights and strict I&O -Weight, 166.6lb with no change since admission  -I&O, net negative 863 since admission -Does not appear to be overtly fluid volume overloaded on exam   -Creatinine, improved to 1.43 today>>was 1.50 on 03/10/18  2.   Atrial fibrillation: -Initially presented in atrial flutter with RVR treated with diltiazem gtt with conversion to atrial fibrillation>>diltiazem now off -Likely contributing to symptoms  -PO cardizem 30 mg po q 8h started for rate control -Xarelto started for anticoagulation with plans for TEE/DCCV today -No s/s of bleeding   -Pt will need to be compliant with A/C for 4 weeks post cardioversion.  -CHA2DS2/VAS Stroke Risk Score 6 (CHF, Vasc Dz, HTN, CVA (2), female)  3. AKI: -Creatinine 1.43 today, 1.50 03/10/18 and 1.18 on admission -Continue to monitor renal trend with daily BMET   4.  Hypertension: -Stable, 117/84>111/89>125/103>126/103 -Home amlodipine 10 mg, lisinopril 10 mg currently on hold  -BP is controlled this AM -No BB in the setting of cocaine use   5. Hx of CAD: -Left heart cath (12/2010): 30% mid  LAD, 30% ostial D1, 50% ostial D2.  -Most recent testing done at [facility] 11/30/2015: No reversible perfusion defects, normal LV EF, coronary artery atherosclerotic calcification seen on CT portion of the exam -No ASA in the setting of Xarelto aspirin, continue statin -Denies chest pain  6. Mitral regurgitation/TR: -Moderate-severe MR per echocardiogram 03/10/18 -Plan for TEE/DCCV today for further evaluation  -Could also be contributing to presenting symptoms   7. Pulmonary hypertension: -Right heart cath 03/2015 showed near-normal right and left heart filling pressures with no evidence of pulmonary hypertension.  -Echo from 03/10/18 showed PA peak pressure 39 mmHg, mild  8. Hyperlipidemia: -Last lipid panel 11/30/2015, LDL 76 -Continue atorvastatin 40 mg   10. Polyubstance abuse/tobacco use: -Urine drug screen negative, has been positive for cocaine in the past -Patient admits to using cocaine just prior to the initiation of her symptoms 2 weeks ago -She admits to smoking cocaine daily for a couple of weeks and then off for a couple weeks.  11. History of  stroke: -Patient is on statin and aspirin 325 mg daily -ASA stopped in the setting of [facility]  Signed, Kathyrn Drown NP-C [facility] Pager: [phone] 03/11/2018, 7:21 AM     For questions or updates, please contact   Please consult [email or web address] for contact info under Cardiology/STEMI.

## 2018-03-11 NOTE — CV Procedure (Signed)
Transesophageal echo  Patient sedated by anesthesia with Propofol intravenously  TEE probe advanced to med esophagus without difficulty  LA,LAA without masses  LVEF appears depressed.   Full report to follow.  Dorris Carnes

## 2018-03-11 NOTE — Anesthesia Postprocedure Evaluation (Signed)
Anesthesia Post Note  Patient: Sundiata Ferrick  Procedure(s) Performed: TRANSESOPHAGEAL ECHOCARDIOGRAM (TEE) (N/A ) CARDIOVERSION (N/A )     Patient location during evaluation: Endoscopy Anesthesia Type: MAC Level of consciousness: awake and alert Pain management: pain level controlled Vital Signs Assessment: post-procedure vital signs reviewed and stable Respiratory status: spontaneous breathing, nonlabored ventilation and respiratory function stable Cardiovascular status: stable and blood pressure returned to baseline Postop Assessment: no apparent nausea or vomiting Anesthetic complications: no    Last Vitals:  Vitals:   03/11/18 1255 03/11/18 1430  BP: (!) 129/107 119/90  Pulse: 91 93  Resp: 14 (!) 31  Temp: 36.8 C 36.7 C  SpO2: 99% 93%    Last Pain:  Vitals:   03/11/18 1430  TempSrc: Oral  PainSc:                  Catalina Gravel

## 2018-03-11 NOTE — Anesthesia Preprocedure Evaluation (Addendum)
Anesthesia Evaluation  Patient identified by MRN, date of birth, ID band Patient awake    Reviewed: Allergy & Precautions, NPO status , Patient's Chart, lab work & pertinent test results  History of Anesthesia Complications Negative for: history of anesthetic complications  Airway Mallampati: III  TM Distance: >3 FB Neck ROM: Full    Dental  (+) Poor Dentition, Missing, Loose,    Pulmonary shortness of breath, asthma , COPD, Current Smoker,    breath sounds clear to auscultation       Cardiovascular hypertension, Pt. on medications + angina + CAD and +CHF  + dysrhythmias Atrial Fibrillation + Valvular Problems/Murmurs MR and AI  Rhythm:Irregular     Neuro/Psych  Headaches, PSYCHIATRIC DISORDERS Anxiety Depression TIACVA    GI/Hepatic negative GI ROS, Neg liver ROS,   Endo/Other  negative endocrine ROS  Renal/GU Renal InsufficiencyRenal disease     Musculoskeletal   Abdominal   Peds  Hematology  (+) Blood dyscrasia, anemia ,   Anesthesia Other Findings - Left ventricle: The cavity size was normal. Wall thickness was   normal. Systolic function was normal. The estimated ejection   fraction was in the range of 50% to 55%. The study is not   technically sufficient to allow evaluation of LV diastolic   function. - Aortic valve: There was mild regurgitation. - Mitral valve: There was moderate to severe regurgitation. - Left atrium: The atrium was severely dilated. - Atrial septum: No defect or patent foramen ovale was identified. - Tricuspid valve: There was moderate regurgitation. - Pulmonary arteries: PA peak pressure: 39 mm Hg (S).  Reproductive/Obstetrics                            Anesthesia Physical Anesthesia Plan  ASA: III  Anesthesia Plan: MAC and General   Post-op Pain Management:    Induction: Intravenous  PONV Risk Score and Plan: 2 and Treatment may vary due to age or  medical condition and Propofol infusion  Airway Management Planned: Nasal Cannula  Additional Equipment: None  Intra-op Plan:   Post-operative Plan:   Informed Consent: I have reviewed the patients History and Physical, chart, labs and discussed the procedure including the risks, benefits and alternatives for the proposed anesthesia with the patient or authorized representative who has indicated his/her understanding and acceptance.   Dental advisory given  Plan Discussed with: CRNA and Surgeon  Anesthesia Plan Comments:        Anesthesia Quick Evaluation

## 2018-03-11 NOTE — Progress Notes (Signed)
Progress Note  Patient Name: Morgan Bean Date of Encounter: 03/11/2018  Primary Cardiologist: [name], MD - New  Subjective   Pt remains SOB with talking. Still in AF with rate controlled in the 80's. Anticipate TEE/DCCV today   Inpatient Medications    Scheduled Meds: . atorvastatin  40 mg Oral q1800  . buPROPion  150 mg Oral BID  . citalopram  40 mg Oral Daily  . diltiazem  30 mg Oral Q8H  . famotidine  20 mg Oral BID  . furosemide  40 mg Intravenous Daily  . mometasone-formoterol  2 puff Inhalation BID  . rivaroxaban  15 mg Oral Q supper  . sodium chloride flush  3 mL Intravenous Q12H  . topiramate  50 mg Oral QHS  . traZODone  150 mg Oral QHS   Continuous Infusions: . sodium chloride    . sodium chloride 20 mL/hr at 03/11/18 0620   PRN Meds: sodium chloride, acetaminophen, hydrALAZINE, hydrOXYzine, ondansetron (ZOFRAN) IV, sodium chloride flush   Vital Signs    Vitals:   03/10/18 1946 03/10/18 2200 03/11/18 0620 03/11/18 0624  BP: (!) 125/103 111/89 117/84 117/84  Pulse: (!) 101   92  Resp:    (!) 22  Temp: 98.5 F (36.9 C)   97.8 F (36.6 C)  TempSrc: Oral   Oral  SpO2: 97%   100%  Weight:      Height:        Intake/Output Summary (Last 24 hours) at 03/11/2018 0720 Last data filed at 03/10/2018 2100 Gross per 24 hour  Intake 1060 ml  Output -  Net 1060 ml   Filed Weights   03/08/18 2255 03/08/18 2308  Weight: 75.6 kg 75.6 kg    Physical Exam   General: frail, AA female, NAD Skin: Warm, dry, intact  Head: Normocephalic, atraumatic, clear, moist mucus membranes. Neck: Negative for carotid bruits. No JVD Lungs:Clear to ausculation bilaterally. No wheezes, rales, or rhonchi. Breathing is mildly labored with talking Cardiovascular: Irregularly irregular with S1 S2. + murmurs. No rubs, gallops, or LV heave appreciated. Abdomen: Soft, non-tender, non-distended with normoactive bowel sounds. No obvious abdominal masses. MSK:  Strength and tone appear normal for age. 5/5 in all extremities Extremities: No edema. No clubbing or cyanosis. DP/PT pulses 2+ bilaterally Neuro: Alert and oriented. No focal deficits. No facial asymmetry. MAE spontaneously. Psych: Responds to questions appropriately with normal affect.    Labs    Chemistry Recent Labs  Lab 03/09/18 0318 03/10/18 0325 03/11/18 0456  NA 138 139 138  K 3.3* 3.6 3.8  CL 110 107 109  CO2 20* 24 20*  GLUCOSE 94 94 90  BUN 20 22* 21*  CREATININE 1.26* 1.50* 1.43*  CALCIUM 8.6* 8.6* 8.7*  GFRNONAA 48* 39* 41*  GFRAA 56* 45* 48*  ANIONGAP 8 8 9     Hematology Recent Labs  Lab 03/08/18 1745 03/11/18 0456  WBC 5.8 5.2  RBC 5.22* 4.68  HGB 12.4 11.1*  HCT 41.4 37.0  MCV 79.3* 79.1*  MCH 23.8* 23.7*  MCHC 30.0 30.0  RDW 18.8* 18.3*  PLT 150 156   Cardiac EnzymesNo results for input(s): TROPONINI in the last 168 hours.  Recent Labs  Lab 03/08/18 1752  TROPIPOC 0.03    BNP Recent Labs  Lab 03/08/18 1746  BNP 497.1*    DDimer  Recent Labs  Lab 03/08/18 1745  DDIMER 2.00*    Radiology    No results found.  Telemetry  03/11/18 AF with HR 80's - Personally Reviewed  ECG    No new tracing as of 03/11/18- Personally Reviewed  Cardiac Studies   Echocardiogram 03/09/18:  Study Conclusions  - Left ventricle: The cavity size was normal. Wall thickness was   normal. Systolic function was normal. The estimated ejection   fraction was in the range of 50% to 55%. The study is not   technically sufficient to allow evaluation of LV diastolic   function. - Aortic valve: There was mild regurgitation. - Mitral valve: There was moderate to severe regurgitation. - Left atrium: The atrium was severely dilated. - Atrial septum: No defect or patent foramen ovale was identified. - Tricuspid valve: There was moderate regurgitation. - Pulmonary arteries: PA peak pressure: 39 mm Hg (S).  Echocardiogram 03/09/18 Study Conclusions -  Left ventricle: The cavity size was normal. Wall thickness was normal. Systolic function was normal. The estimated ejection fraction was in the range of 50% to 55%. The study is not technically sufficient to allow evaluation of LV diastolic function. - Aortic valve: There was mild regurgitation. - Mitral valve: There was moderate to severe regurgitation. - Left atrium: The atrium was severely dilated. - Atrial septum: No defect or patent foramen ovale was identified. - Tricuspid valve: There was moderate regurgitation. - Pulmonary arteries: PA peak pressure: 39 mm Hg (S).  Right heart catheterization 04/23/2015: Near-normal right and left heart filling pressures, no pulmonary hypertension.  No evidence for hemodynamically significant TR.  She can continue the same Lasix dose with no change.  I suspect that COPD may be the major cause of her dyspnea at this point.  2. Cardiac output lower than I would expect.   Patient Profile     52 y.o. female with a hx of polysubstance abuse, depression with anxiety, chronic diastolic CHF, pulmonary hypertension, CAD, history of CVA, atrial flutter s/p ablation, sleep apnea and COPD who is being seen today for the evaluation of CHF and atrial tachycardia at the request of Dr. Karleen Hampshire.  Assessment & Plan   1. Acute on chronic diastolic heart failure: -Most recent echocardiogram showed EF 50-55%, mild MR, mod-severe MR, mod TR, PA peak pressure 39 mmHg, not sufficient to allow evaluation of diastolic function.  -Prescribed Lasix 40 mg twice daily as an outpatient, however pt was noncompliant with medications and not taking -Continue with IV Lasix 40 mg twice daily and monitor response -Continue with daily weights and strict I&O -Weight, 166.6lb with no change since admission  -I&O, net negative 863 since admission -Does not appear to be overtly fluid volume overloaded on exam   -Creatinine, improved to 1.43 today>>was 1.50 on 03/10/18  2.   Atrial fibrillation: -Initially presented in atrial flutter with RVR treated with diltiazem gtt with conversion to atrial fibrillation>>diltiazem now off -Likely contributing to symptoms  -PO cardizem 30 mg po q 8h started for rate control -Xarelto started for anticoagulation with plans for TEE/DCCV today -No s/s of bleeding   -Pt will need to be compliant with A/C for 4 weeks post cardioversion.  -CHA2DS2/VAS Stroke Risk Score 6 (CHF, Vasc Dz, HTN, CVA (2), female)  3. AKI: -Creatinine 1.43 today, 1.50 03/10/18 and 1.18 on admission -Continue to monitor renal trend with daily BMET   4.  Hypertension: -Stable, 117/84>111/89>125/103>126/103 -Home amlodipine 10 mg, lisinopril 10 mg currently on hold  -BP is controlled this AM -No BB in the setting of cocaine use   5. Hx of CAD: -Left heart cath (12/2010): 30% mid  LAD, 30% ostial D1, 50% ostial D2.  -Most recent testing done at [facility] 11/30/2015: No reversible perfusion defects, normal LV EF, coronary artery atherosclerotic calcification seen on CT portion of the exam -No ASA in the setting of Xarelto aspirin, continue statin -Denies chest pain  6. Mitral regurgitation/TR: -Moderate-severe MR per echocardiogram 03/10/18 -Plan for TEE/DCCV today for further evaluation  -Could also be contributing to presenting symptoms   7. Pulmonary hypertension: -Right heart cath 03/2015 showed near-normal right and left heart filling pressures with no evidence of pulmonary hypertension.  -Echo from 03/10/18 showed PA peak pressure 39 mmHg, mild  8. Hyperlipidemia: -Last lipid panel 11/30/2015, LDL 76 -Continue atorvastatin 40 mg   10. Polyubstance abuse/tobacco use: -Urine drug screen negative, has been positive for cocaine in the past -Patient admits to using cocaine just prior to the initiation of her symptoms 2 weeks ago -She admits to smoking cocaine daily for a couple of weeks and then off for a couple weeks.  11. History of  stroke: -Patient is on statin and aspirin 325 mg daily -ASA stopped in the setting of [facility]  Signed, Kathyrn Drown NP-C [facility] Pager: [phone] 03/11/2018, 7:21 AM     For questions or updates, please contact   Please consult [email or web address] for contact info under Cardiology/STEMI.

## 2018-03-11 NOTE — Anesthesia Procedure Notes (Signed)
Procedure Name: MAC Date/Time: 03/11/2018 2:08 PM Performed by: Renato Shin, CRNA Pre-anesthesia Checklist: Patient identified, Emergency Drugs available, Suction available and Patient being monitored Patient Re-evaluated:Patient Re-evaluated prior to induction Oxygen Delivery Method: Nasal cannula Preoxygenation: Pre-oxygenation with 100% oxygen Induction Type: IV induction Placement Confirmation: positive ETCO2 and breath sounds checked- equal and bilateral Dental Injury: Teeth and Oropharynx as per pre-operative assessment

## 2018-03-11 NOTE — Progress Notes (Addendum)
Patient entered into [facility] system, provided w letter, and xaraltocard verbalized understanding of how to use

## 2018-03-12 LAB — BASIC METABOLIC PANEL
Anion gap: 9 (ref 5–15)
BUN: 19 mg/dL (ref 6–20)
CO2: 20 mmol/L — ABNORMAL LOW (ref 22–32)
Calcium: 8.8 mg/dL — ABNORMAL LOW (ref 8.9–10.3)
Chloride: 108 mmol/L (ref 98–111)
Creatinine, Ser: 1.39 mg/dL — ABNORMAL HIGH (ref 0.44–1.00)
GFR calc Af Amer: 50 mL/min — ABNORMAL LOW (ref >60)
GFR calc non Af Amer: 43 mL/min — ABNORMAL LOW (ref >60)
Glucose, Bld: 85 mg/dL (ref 70–99)
Potassium: 3.6 mmol/L (ref 3.5–5.1)
Sodium: 137 mmol/L (ref 135–145)

## 2018-03-12 NOTE — Progress Notes (Signed)
PROGRESS NOTE    Morgan Bean  HKV:[id number] DOB: 07-28-1965 DOA: 03/08/2018 PCP: Patient, No Pcp Per    Brief Narrative:Morgan Bean is a 52 y.o. female with medical history significant for polysubstance abuse, depression with anxiety, chronic diastolic CHF, pulmonary hypertension, coronary artery disease, history of CVA, hypertension, and COPD, now presenting to the emergency department for evaluation of shortness of breath and orthopnea.  The patient reports that the symptoms developed over the past week and have been worsening.Chest x-ray is notable for cardiomegaly with interstitial and alveolar pulmonary edema.  Assessment & Plan:   Principal Problem:   Acute on chronic diastolic CHF (congestive heart failure) ([name]) Active Problems:   Major depressive disorder, recurrent episode with psychotic features.   History of cocaine abuse ([place])   Polysubstance abuse ([place])   History of stroke   COPD with chronic bronchitis ([place])   Hypertensive urgency   Atrial tachycardia ([name])   Acute respiratory failure with hypoxia ([name])   Persistent atrial fibrillation   CHF exacerbation ([name])   Acute pulmonary edema ([name])   Atrial fibrillation with RVR ([name])   Nonrheumatic mitral valve regurgitation   Acute respiratory failure with hypoxia secondary to acute on chronic diastolic heart failure Improved.  Continue with IV Lasix 40 mg daily  monitor urine output, with strict intake and output and daily weights.   Watch renal parameters while on IV Lasix,,. CTA negative for PE but findings consistent with acute CHF. Echocardiogram reviewed.  Cardiology consulted for recommendations.   Atrial tachycardia Patient was started on IV Cardizem gtt. and transition to oral Cardizem by cardiology.  TSH within normal limits.   Troponin negative at this time. Patient denies any chest pain. Underwent TEE/DCCV on 03/11/2018 and she was started on anticoagulation by cardiology. Heart rate still in upper  90s, on Cardizem 120 mg daily Xarelto for anticoagulation.   History of polysubstance abuse  counseled towards cessation Will need social worker consult for resources.   Hypertensive urgency Resolved. Blood pressure parameters are well within normal limits.    Depression, anxiety and insomnia Continue with Wellbutrin and Celexa and trazodone.    History of nonhemorrhagic CVA Continue with aspirin and statin.   History of COPD Stable no wheezing heard on exam.   Hypokalemia Replaced.    DVT prophylaxis: Lovenox Code Status: Full code Family Communication: Discussed with family at bedside Disposition Plan: Pending clinical improvement possibly SNF versus home health PT   Consultants:   Cardiology Dr. Oval Linsey   Procedures: Echocardiogram DCCV/TEE  Antimicrobials: None  Subjective: Patient continues to cough nonproductive. Breathing has improved.  Objective: Vitals:   03/11/18 2135 03/12/18 0650 03/12/18 0941 03/12/18 1436  BP: 106/87 123/83 (!) 120/98 (!) 114/94  Pulse: 84 89  63  Resp: (!) 22     Temp:  98.9 F (37.2 C)  98.2 F (36.8 C)  TempSrc:  Oral  Oral  SpO2: 95% 100%  100%  Weight:  72.4 kg    Height:       No intake or output data in the 24 hours ending 03/12/18 1544 Filed Weights   03/08/18 2308 03/11/18 1255 03/12/18 0650  Weight: 75.6 kg 75.6 kg 72.4 kg    Examination:  General exam: Alert and comfortable, not in any distress Respiratory system: Clear to auscultation bilaterally, no wheezing. Cardiovascular system: S1 & S2 heard, RRR. No JVD, murmur present Gastrointestinal system: Abdomen is soft, nontender, nondistended, good bowel sounds Central nervous system: Alert and oriented.  Grossly nonfocal Extremities:  Symmetric 5 x 5 power. Skin: No rashes, lesions or ulcers Psychiatry:  Mood & affect appropriate.     Data Reviewed: I have personally reviewed following labs and imaging studies  CBC: Recent Labs  Lab  03/08/18 1745 03/11/18 0456  WBC 5.8 5.2  HGB 12.4 11.1*  HCT 41.4 37.0  MCV 79.3* 79.1*  PLT 150 287   Basic Metabolic Panel: Recent Labs  Lab 03/08/18 1745 03/09/18 0318 03/10/18 0325 03/11/18 0456 03/12/18 0347  NA 138 138 139 138 137  K 3.9 3.3* 3.6 3.8 3.6  CL 111 110 107 109 108  CO2 19* 20* 24 20* 20*  GLUCOSE 115* 94 94 90 85  BUN 22* 20 22* 21* 19  CREATININE 1.18* 1.26* 1.50* 1.43* 1.39*  CALCIUM 9.2 8.6* 8.6* 8.7* 8.8*  MG  --  2.2  --   --   --    GFR: Estimated Creatinine Clearance: 47.1 mL/min (A) (by C-G formula based on SCr of 1.39 mg/dL (H)). Liver Function Tests: No results for input(s): AST, ALT, ALKPHOS, BILITOT, PROT, ALBUMIN in the last 168 hours. No results for input(s): LIPASE, AMYLASE in the last 168 hours. No results for input(s): AMMONIA in the last 168 hours. Coagulation Profile: No results for input(s): INR, PROTIME in the last 168 hours. Cardiac Enzymes: No results for input(s): CKTOTAL, CKMB, CKMBINDEX, TROPONINI in the last 168 hours. BNP (last 3 results) No results for input(s): PROBNP in the last 8760 hours. HbA1C: No results for input(s): HGBA1C in the last 72 hours. CBG: No results for input(s): GLUCAP in the last 168 hours. Lipid Profile: No results for input(s): CHOL, HDL, LDLCALC, TRIG, CHOLHDL, LDLDIRECT in the last 72 hours. Thyroid Function Tests: No results for input(s): TSH, T4TOTAL, FREET4, T3FREE, THYROIDAB in the last 72 hours. Anemia Panel: No results for input(s): VITAMINB12, FOLATE, FERRITIN, TIBC, IRON, RETICCTPCT in the last 72 hours. Sepsis Labs: No results for input(s): PROCALCITON, LATICACIDVEN in the last 168 hours.  Recent Results (from the past 240 hour(s))  Culture, blood (routine x 2)     Status: None (Preliminary result)   Collection Time: 03/10/18  2:54 PM  Result Value Ref Range Status   Specimen Description BLOOD LEFT ANTECUBITAL  Final   Special Requests   Final    BOTTLES DRAWN AEROBIC ONLY  Blood Culture adequate volume   Culture   Final    NO GROWTH 2 DAYS Performed at [facility], [address], [place], [place] [address]    Report Status PENDING  Incomplete  Culture, blood (routine x 2)     Status: None (Preliminary result)   Collection Time: 03/10/18  2:59 PM  Result Value Ref Range Status   Specimen Description BLOOD RIGHT HAND  Final   Special Requests   Final    BOTTLES DRAWN AEROBIC ONLY Blood Culture results may not be optimal due to an inadequate volume of blood received in culture bottles   Culture   Final    NO GROWTH 2 DAYS Performed at [facility], [place] [address] [place], [place] [address]    Report Status PENDING  Incomplete         Radiology Studies: No results found.      Scheduled Meds: . atorvastatin  40 mg Oral q1800  . buPROPion  150 mg Oral BID  . citalopram  40 mg Oral Daily  . diltiazem  120 mg Oral Daily  . famotidine  20 mg Oral BID  .  furosemide  40 mg Intravenous Daily  . mometasone-formoterol  2 puff Inhalation BID  . rivaroxaban  20 mg Oral Q supper  . sodium chloride flush  3 mL Intravenous Q12H  . topiramate  50 mg Oral QHS  . traZODone  150 mg Oral QHS   Continuous Infusions: . sodium chloride       LOS: 2 days    Time spent: 26 minutes    Hosie Poisson, MD [facility] Pager [phone] If 7PM-7AM, please contact night-coverage [email or web address] Password [facility] 03/12/2018, 3:44 PM

## 2018-03-12 NOTE — Evaluation (Signed)
Physical Therapy Evaluation Patient Details Name: Morgan Bean MRN: [id number] DOB: 07-12-1965 Today's Date: 03/12/2018   History of Present Illness  Patient presented to the hospital with A-fib RVR. PMH syphi,is stroke, HTN, poly substance abuse, CAD, CHF, depression, mental disorder, headachwe  Clinical Impression  Patient presented with decreased endurance with gait. She was only able to walk about 15 feet before reporting fatigue and syncope. She recovered after a short rest break. She was then able to stand again and walk but was again fatigued. She would benefit from skilled acute therapy. She would benefit from home health rehab to improve endurance v rehab at a SNF depending on her progression of mobility.     Follow Up Recommendations Home health PT;SNF    Equipment Recommendations       Recommendations for Other Services       Precautions / Restrictions Precautions Precautions: None Restrictions Weight Bearing Restrictions: No      Mobility  Bed Mobility Overal bed mobility: Independent             General bed mobility comments: Patient able to suit up at the edge of the bed without difficulty and able to reach down for her slippers   Transfers Overall transfer level: Needs assistance Equipment used: None Transfers: Sit to/from Stand Sit to Stand: Min guard            Ambulation/Gait Ambulation/Gait assistance: Min guard Gait Distance (Feet): 30 Feet Assistive device: None       General Gait Details: slow gait pattern. After about 15 minutes the patient reported syncope. She reported improved syncope after a 30 second standing rest break. She was able to make it back to her bed.  Stairs            Wheelchair Mobility    Modified Rankin (Stroke Patients Only)       Balance                                             Pertinent Vitals/Pain Pain Assessment: No/denies pain    Home Living Family/patient expects to  be discharged to:: Private residence Living Arrangements: Spouse/significant other Available Help at Discharge: Family Type of Home: House Home Access: Level entry              Prior Function Level of Independence: Independent         Comments: Patient reports she stumbled sometimes but was not using an AD      Hand Dominance   Dominant Hand: Right    Extremity/Trunk Assessment   Upper Extremity Assessment Upper Extremity Assessment: Overall WFL for tasks assessed    Lower Extremity Assessment Lower Extremity Assessment: Generalized weakness    Cervical / Trunk Assessment Cervical / Trunk Assessment: Normal  Communication   Communication: No difficulties  Cognition Arousal/Alertness: Awake/alert Behavior During Therapy: WFL for tasks assessed/performed Overall Cognitive Status: Within Functional Limits for tasks assessed                                        General Comments      Exercises     Assessment/Plan    PT Assessment Patient needs continued PT services  PT Problem List Decreased strength;Decreased range of motion;Decreased activity tolerance;Decreased balance;Decreased mobility  PT Treatment Interventions DME instruction;Gait training;Functional mobility training;Stair training;Therapeutic exercise;Therapeutic activities;Neuromuscular re-education;Patient/family education    PT Goals (Current goals can be found in the Care Plan section)  Acute Rehab PT Goals Patient Stated Goal: to get stronger PT Goal Formulation: With patient Time For Goal Achievement: 03/19/18 Potential to Achieve Goals: Good    Frequency Min 3X/week   Barriers to discharge        Co-evaluation               AM-PAC PT "6 Clicks" Mobility  Outcome Measure Help needed turning from your back to your side while in a flat bed without using bedrails?: A Little Help needed moving from lying on your back to sitting on the side of a flat bed  without using bedrails?: A Little Help needed moving to and from a bed to a chair (including a wheelchair)?: A Little Help needed standing up from a chair using your arms (e.g., wheelchair or bedside chair)?: A Little Help needed to walk in hospital room?: A Little Help needed climbing 3-5 steps with a railing? : A Lot 6 Click Score: 17    End of Session Equipment Utilized During Treatment: Gait belt Activity Tolerance: Patient limited by fatigue Patient left: in bed;with call bell/phone within reach Nurse Communication: Mobility status PT Visit Diagnosis: Unsteadiness on feet (R26.81);Difficulty in walking, not elsewhere classified (R26.2);Muscle weakness (generalized) (M62.81)    Time: 0301-3143 PT Time Calculation (min) (ACUTE ONLY): 15 min   Charges:   PT Evaluation $PT Eval Moderate Complexity: 1 Mod            Carney Living PT DPT  03/12/2018, 3:55 PM

## 2018-03-12 NOTE — Progress Notes (Signed)
Progress Note  Patient Name: Morgan Bean Date of Encounter: 03/12/2018  Primary Cardiologist: [name], MD - New  Subjective   Less dyspnea post [facility]   Inpatient Medications    Scheduled Meds: . atorvastatin  40 mg Oral q1800  . buPROPion  150 mg Oral BID  . citalopram  40 mg Oral Daily  . diltiazem  120 mg Oral Daily  . famotidine  20 mg Oral BID  . furosemide  40 mg Intravenous Daily  . mometasone-formoterol  2 puff Inhalation BID  . rivaroxaban  20 mg Oral Q supper  . sodium chloride flush  3 mL Intravenous Q12H  . topiramate  50 mg Oral QHS  . traZODone  150 mg Oral QHS   Continuous Infusions: . sodium chloride     PRN Meds: sodium chloride, acetaminophen, hydrALAZINE, hydrOXYzine, ondansetron (ZOFRAN) IV, sodium chloride flush   Vital Signs    Vitals:   03/11/18 1607 03/11/18 2135 03/12/18 0650 03/12/18 0941  BP: (!) 112/94 106/87 123/83 (!) 120/98  Pulse: 84 84 89   Resp: (!) 24 (!) 22    Temp: 99.2 F (37.3 C)  98.9 F (37.2 C)   TempSrc: Oral  Oral   SpO2: 92% 95% 100%   Weight:   72.4 kg   Height:        Intake/Output Summary (Last 24 hours) at 03/12/2018 1023 Last data filed at 03/11/2018 1416 Gross per 24 hour  Intake 200 ml  Output -  Net 200 ml   Filed Weights   03/08/18 2308 03/11/18 1255 03/12/18 0650  Weight: 75.6 kg 75.6 kg 72.4 kg    Physical Exam   BP (!) 120/98   Pulse 89   Temp 98.9 F (37.2 C) (Oral)   Resp (!) 22   Ht 5\' 6"  (1.676 m)   Wt 72.4 kg   LMP 05/10/2013   SpO2 100%   BMI 25.78 kg/m  Affect appropriate Chronically ill black female  HEENT: normal Neck supple with no adenopathy JVP normal no bruits no thyromegaly Lungs clear with no wheezing and good diaphragmatic motion Heart:  S1/S2  MR murmur, no rub, gallop or click PMI normal Abdomen: benighn, BS positve, no tenderness, no AAA no bruit.  No HSM or HJR Distal pulses intact with no bruits No edema Neuro non-focal Skin warm and  dry No muscular weakness  Labs    Chemistry Recent Labs  Lab 03/10/18 0325 03/11/18 0456 03/12/18 0347  NA 139 138 137  K 3.6 3.8 3.6  CL 107 109 108  CO2 24 20* 20*  GLUCOSE 94 90 85  BUN 22* 21* 19  CREATININE 1.50* 1.43* 1.39*  CALCIUM 8.6* 8.7* 8.8*  GFRNONAA 39* 41* 43*  GFRAA 45* 48* 50*  ANIONGAP 8 9 9     Hematology Recent Labs  Lab 03/08/18 1745 03/11/18 0456  WBC 5.8 5.2  RBC 5.22* 4.68  HGB 12.4 11.1*  HCT 41.4 37.0  MCV 79.3* 79.1*  MCH 23.8* 23.7*  MCHC 30.0 30.0  RDW 18.8* 18.3*  PLT 150 156   Cardiac EnzymesNo results for input(s): TROPONINI in the last 168 hours.  Recent Labs  Lab 03/08/18 1752  TROPIPOC 0.03    BNP Recent Labs  Lab 03/08/18 1746  BNP 497.1*    DDimer  Recent Labs  Lab 03/08/18 1745  DDIMER 2.00*    Radiology    No results found.  Telemetry    NSR PaCls 03/12/2018   ECG  No new tracing as of 03/11/18- Personally Reviewed  Cardiac Studies   Echocardiogram 03/09/18:  Study Conclusions  - Left ventricle: The cavity size was normal. Wall thickness was   normal. Systolic function was normal. The estimated ejection   fraction was in the range of 50% to 55%. The study is not   technically sufficient to allow evaluation of LV diastolic   function. - Aortic valve: There was mild regurgitation. - Mitral valve: There was moderate to severe regurgitation. - Left atrium: The atrium was severely dilated. - Atrial septum: No defect or patent foramen ovale was identified. - Tricuspid valve: There was moderate regurgitation. - Pulmonary arteries: PA peak pressure: 39 mm Hg (S).  Echocardiogram 03/09/18 Study Conclusions - Left ventricle: The cavity size was normal. Wall thickness was normal. Systolic function was normal. The estimated ejection fraction was in the range of 50% to 55%. The study is not technically sufficient to allow evaluation of LV diastolic function. - Aortic valve: There was mild  regurgitation. - Mitral valve: There was moderate to severe regurgitation. - Left atrium: The atrium was severely dilated. - Atrial septum: No defect or patent foramen ovale was identified. - Tricuspid valve: There was moderate regurgitation. - Pulmonary arteries: PA peak pressure: 39 mm Hg (S).  Right heart catheterization 04/23/2015: Near-normal right and left heart filling pressures, no pulmonary hypertension.  No evidence for hemodynamically significant TR.  She can continue the same Lasix dose with no change.  I suspect that COPD may be the major cause of her dyspnea at this point.  2. Cardiac output lower than I would expect.   Patient Profile     52 y.o. female with a hx of polysubstance abuse, depression with anxiety, chronic diastolic CHF, pulmonary hypertension, CAD, history of CVA, atrial flutter s/p ablation, sleep apnea and COPD who is being seen today for the evaluation of CHF and atrial tachycardia at the request of Dr. Karleen Hampshire.  Assessment & Plan   1. Acute on chronic diastolic heart failure:- EF 50-55% improved related to PAF   2.  Atrial fibrillation:- post TEE/DCC 03/11/18 continue Xarelto and cardizem CHA2DVASC 6   3. AKI:- stable - Cr 1.39    4.  Hypertension:- Well controlled.  Continue current medications and low sodium Dash type diet.     5. Hx of CAD: -no angina myvoue 11/30/15 non ischemic observe Cath 2012 no obstructive disease    6. Mitral regurgitation/TR:- moderate to severe by TEE 03/11/18 will need outpatient f/u not an Ideal surgical candidate with history of substance abuse   7. Pulmonary hypertension: -Right heart cath 03/2015 showed near-normal right and left heart filling pressures with no evidence of pulmonary hypertension.  -Echo from 03/10/18 showed PA peak pressure 39 mmHg, mild  8. Hyperlipidemia: -Last lipid panel 11/30/2015, LDL 76 -Continue atorvastatin 40 mg   10. Polyubstance abuse/tobacco use: -Urine drug screen negative, has  been positive for cocaine in the past -Patient admits to using cocaine just prior to the initiation of her symptoms 2 weeks ago -She admits to smoking cocaine daily for a couple of weeks and then off for a couple weeks.  11. History of stroke: -Patient is on statin and aspirin 325 mg daily -ASA stopped in the setting of [facility]  Patient should be ready for d/c in am if maintaining NSR Will arrange outpatient f/u with Dr Oval Linsey  Will sign off  Jenkins Rouge

## 2018-03-13 ENCOUNTER — Encounter (HOSPITAL_COMMUNITY): Payer: Self-pay | Admitting: Internal Medicine

## 2018-03-13 LAB — BASIC METABOLIC PANEL
Anion gap: 9 (ref 5–15)
BUN: 18 mg/dL (ref 6–20)
CHLORIDE: 109 mmol/L (ref 98–111)
CO2: 19 mmol/L — AB (ref 22–32)
Calcium: 9.1 mg/dL (ref 8.9–10.3)
Creatinine, Ser: 1.4 mg/dL — ABNORMAL HIGH (ref 0.44–1.00)
GFR calc Af Amer: 49 mL/min — ABNORMAL LOW (ref >60)
GFR calc non Af Amer: 42 mL/min — ABNORMAL LOW (ref >60)
Glucose, Bld: 91 mg/dL (ref 70–99)
Potassium: 3.6 mmol/L (ref 3.5–5.1)
Sodium: 137 mmol/L (ref 135–145)

## 2018-03-13 NOTE — Progress Notes (Addendum)
Noted that patient is back in atrial fibrillation; rate is controlled at this time <110. Notified Dr. Karleen Hampshire via text page. Will continue to monitor.

## 2018-03-13 NOTE — Progress Notes (Signed)
PROGRESS NOTE    Morgan Bean  ZOX:[id number] DOB: 11-16-1965 DOA: 03/08/2018 PCP: Patient, No Pcp Per    Brief Narrative:Morgan Bean is a 52 y.o. female with medical history significant for polysubstance abuse, depression with anxiety, chronic diastolic CHF, pulmonary hypertension, coronary artery disease, history of CVA, hypertension, and COPD, now presenting to the emergency department for evaluation of shortness of breath and orthopnea.  The patient reports that the symptoms developed over the past week and have been worsening.Chest x-ray is notable for cardiomegaly with interstitial and alveolar pulmonary edema.  Assessment & Plan:   Principal Problem:   Acute on chronic diastolic CHF (congestive heart failure) ([name]) Active Problems:   Major depressive disorder, recurrent episode with psychotic features.   History of cocaine abuse ([place])   Polysubstance abuse ([place])   History of stroke   COPD with chronic bronchitis ([place])   Hypertensive urgency   Atrial tachycardia ([name])   Acute respiratory failure with hypoxia ([name])   Persistent atrial fibrillation   CHF exacerbation ([name])   Acute pulmonary edema ([name])   Atrial fibrillation with RVR ([name])   Nonrheumatic mitral valve regurgitation   Acute respiratory failure with hypoxia secondary to acute on chronic diastolic heart failure Improved.  Continue with IV Lasix 40 mg daily creatinine stable on IV Lasix.  monitor urine output, with strict intake and output and daily weights.    CTA negative for PE but findings consistent with acute CHF. Echocardiogram reviewed.  Cardiology consulted for recommendations.   Atrial tachycardia Patient was started on IV Cardizem gtt. and transition to oral Cardizem by cardiology.  TSH within normal limits.   Troponin negative at this time. Patient denies any chest pain. Underwent TEE/DCCV on 03/11/2018 and she was started on anticoagulation by cardiology. She is back in A. fib with rates between  90s to 110, currently on Cardizem 120 mg daily, Xarelto for anticoagulation. Probably increase Cardizem to 180 mg tomorrow   History of polysubstance abuse  counseled towards cessation Will need social worker consult for resources.   Hypertensive urgency Resolved. Blood pressure parameters are well within normal limits.    Depression, anxiety and insomnia Continue with Wellbutrin and Celexa and trazodone.    History of nonhemorrhagic CVA Continue with aspirin and statin.   History of COPD Stable no wheezing heard on exam.   Hypokalemia Replaced.    DVT prophylaxis: Lovenox Code Status: Full code Family Communication: Discussed with family at bedside Disposition Plan: Pending clinical improvement possibly SNF versus home health PT   Consultants:   Cardiology Dr. Oval Linsey   Procedures: Echocardiogram DCCV/TEE  Antimicrobials: None  Subjective: PT refuses to go to SNF.  No chest pain , sob or nausea or vomiting.  Pt reports palpitations.   Objective: Vitals:   03/13/18 0515 03/13/18 0700 03/13/18 0906 03/13/18 1405  BP: (!) 116/93  (!) 118/100 111/90  Pulse: 82 85  91  Resp:  16  18  Temp: (!) 97.5 F (36.4 C)   98.4 F (36.9 C)  TempSrc: Oral   Oral  SpO2: 95% 96%  99%  Weight: 71.2 kg     Height:        Intake/Output Summary (Last 24 hours) at 03/13/2018 1807 Last data filed at 03/13/2018 1406 Gross per 24 hour  Intake 387 ml  Output -  Net 387 ml   Filed Weights   03/11/18 1255 03/12/18 0650 03/13/18 0515  Weight: 75.6 kg 72.4 kg 71.2 kg    Examination:  General exam:  no distress noted.  Respiratory system: Clear to auscultation bilaterally, no wheezing.but remains tachypneic.  Cardiovascular system: S1 & S2 heard, irregular, tachycardic.  Gastrointestinal system: Abdomen is soft NT nd bs+  Central nervous system: Alert and oriented.  Grossly nonfocal Extremities: Symmetric 5 x 5 power. Skin: No rashes, lesions or  ulcers Psychiatry:  Mood & affect appropriate.     Data Reviewed: I have personally reviewed following labs and imaging studies  CBC: Recent Labs  Lab 03/08/18 1745 03/11/18 0456  WBC 5.8 5.2  HGB 12.4 11.1*  HCT 41.4 37.0  MCV 79.3* 79.1*  PLT 150 161   Basic Metabolic Panel: Recent Labs  Lab 03/09/18 0318 03/10/18 0325 03/11/18 0456 03/12/18 0347 03/13/18 0521  NA 138 139 138 137 137  K 3.3* 3.6 3.8 3.6 3.6  CL 110 107 109 108 109  CO2 20* 24 20* 20* 19*  GLUCOSE 94 94 90 85 91  BUN 20 22* 21* 19 18  CREATININE 1.26* 1.50* 1.43* 1.39* 1.40*  CALCIUM 8.6* 8.6* 8.7* 8.8* 9.1  MG 2.2  --   --   --   --    GFR: Estimated Creatinine Clearance: 46.5 mL/min (A) (by C-G formula based on SCr of 1.4 mg/dL (H)). Liver Function Tests: No results for input(s): AST, ALT, ALKPHOS, BILITOT, PROT, ALBUMIN in the last 168 hours. No results for input(s): LIPASE, AMYLASE in the last 168 hours. No results for input(s): AMMONIA in the last 168 hours. Coagulation Profile: No results for input(s): INR, PROTIME in the last 168 hours. Cardiac Enzymes: No results for input(s): CKTOTAL, CKMB, CKMBINDEX, TROPONINI in the last 168 hours. BNP (last 3 results) No results for input(s): PROBNP in the last 8760 hours. HbA1C: No results for input(s): HGBA1C in the last 72 hours. CBG: No results for input(s): GLUCAP in the last 168 hours. Lipid Profile: No results for input(s): CHOL, HDL, LDLCALC, TRIG, CHOLHDL, LDLDIRECT in the last 72 hours. Thyroid Function Tests: No results for input(s): TSH, T4TOTAL, FREET4, T3FREE, THYROIDAB in the last 72 hours. Anemia Panel: No results for input(s): VITAMINB12, FOLATE, FERRITIN, TIBC, IRON, RETICCTPCT in the last 72 hours. Sepsis Labs: No results for input(s): PROCALCITON, LATICACIDVEN in the last 168 hours.  Recent Results (from the past 240 hour(s))  Culture, blood (routine x 2)     Status: None (Preliminary result)   Collection Time: 03/10/18   2:54 PM  Result Value Ref Range Status   Specimen Description BLOOD LEFT ANTECUBITAL  Final   Special Requests   Final    BOTTLES DRAWN AEROBIC ONLY Blood Culture adequate volume   Culture   Final    NO GROWTH 3 DAYS Performed at [facility], [address], [place], [place] [address]    Report Status PENDING  Incomplete  Culture, blood (routine x 2)     Status: None (Preliminary result)   Collection Time: 03/10/18  2:59 PM  Result Value Ref Range Status   Specimen Description BLOOD RIGHT HAND  Final   Special Requests   Final    BOTTLES DRAWN AEROBIC ONLY Blood Culture results may not be optimal due to an inadequate volume of blood received in culture bottles   Culture   Final    NO GROWTH 3 DAYS Performed at [facility], [place] [address], [place], [place] [address]    Report Status PENDING  Incomplete         Radiology Studies: No results found.  Scheduled Meds: . atorvastatin  40 mg Oral q1800  . buPROPion  150 mg Oral BID  . citalopram  40 mg Oral Daily  . diltiazem  120 mg Oral Daily  . famotidine  20 mg Oral BID  . furosemide  40 mg Intravenous Daily  . mometasone-formoterol  2 puff Inhalation BID  . rivaroxaban  20 mg Oral Q supper  . sodium chloride flush  3 mL Intravenous Q12H  . topiramate  50 mg Oral QHS  . traZODone  150 mg Oral QHS   Continuous Infusions: . sodium chloride       LOS: 3 days    Time spent: 32 minutes    Hosie Poisson, MD [facility] Pager [phone] If 7PM-7AM, please contact night-coverage [email or web address] Password [facility] 03/13/2018, 6:07 PM

## 2018-03-14 MED ORDER — TOPIRAMATE 25 MG PO TABS: 50.0000 mg | ORAL_TABLET | Freq: Every day | ORAL | 1 refills | Status: DC

## 2018-03-14 MED ORDER — DILTIAZEM HCL ER COATED BEADS 180 MG PO CP24: 180.0000 mg | ORAL_CAPSULE | Freq: Every day | ORAL | 1 refills | Status: DC

## 2018-03-14 MED ORDER — BUPROPION HCL ER (SR) 150 MG PO TB12: 150.0000 mg | ORAL_TABLET | Freq: Two times a day (BID) | ORAL | 0 refills | Status: DC

## 2018-03-14 MED ORDER — DILTIAZEM HCL 60 MG PO TABS
60.0000 mg | ORAL_TABLET | Freq: Once | ORAL | Status: AC
  Administered 2018-03-14: 60 mg via ORAL
  Filled 2018-03-14: qty 1

## 2018-03-14 MED ORDER — DILTIAZEM HCL ER COATED BEADS 180 MG PO CP24: 180.0000 mg | ORAL_CAPSULE | Freq: Every day | ORAL | Status: DC

## 2018-03-14 MED ORDER — FUROSEMIDE 20 MG PO TABS: 40.0000 mg | ORAL_TABLET | Freq: Two times a day (BID) | ORAL | 3 refills | Status: DC

## 2018-03-14 MED ORDER — MOMETASONE FURO-FORMOTEROL FUM 100-5 MCG/ACT IN AERO: 2.0000 | INHALATION_SPRAY | Freq: Two times a day (BID) | RESPIRATORY_TRACT | 1 refills | Status: DC

## 2018-03-14 MED ORDER — HYDROXYZINE PAMOATE 25 MG PO CAPS: 25.0000 mg | ORAL_CAPSULE | Freq: Four times a day (QID) | ORAL | 0 refills | Status: DC | PRN

## 2018-03-14 MED ORDER — CITALOPRAM HYDROBROMIDE 40 MG PO TABS: 40.0000 mg | ORAL_TABLET | Freq: Every day | ORAL | 1 refills | Status: DC

## 2018-03-14 MED ORDER — RIVAROXABAN 20 MG PO TABS: 20.0000 mg | ORAL_TABLET | Freq: Every day | ORAL | 1 refills | Status: DC

## 2018-03-14 NOTE — Progress Notes (Signed)
Physical Therapy Treatment Patient Details Name: Morgan Bean MRN: [id number] DOB: July 08, 1965 Today's Date: 03/14/2018    History of Present Illness Patient presented to the hospital with A-fib RVR. PMH syphi,is stroke, HTN, poly substance abuse, CAD, CHF, depression, mental disorder, headachwe    PT Comments    Continuing work on functional mobility and activity tolerance;  Overall much improved hallway amb from last session; general unsteadiness without UE support, but no overt loss of balance; Worth considering Rollator RW    Follow Up Recommendations  Home health PT;Supervision - Intermittent     Equipment Recommendations  Other (comment)(Rollator RW)    Recommendations for Other Services       Precautions / Restrictions Precautions Precautions: Fall    Mobility  Bed Mobility Overal bed mobility: Independent             General bed mobility comments: Patient able to suit up at the edge of the bed without difficulty and able to reach down for her slippers   Transfers Overall transfer level: Needs assistance Equipment used: None Transfers: Sit to/from Stand Sit to Stand: Min guard         General transfer comment: [place] for safety; noting some incr sway at initial stand  Ambulation/Gait Ambulation/Gait assistance: Min guard Gait Distance (Feet): 300 Feet(one lengthy seated rest break) Assistive device: None Gait Pattern/deviations: Step-through pattern;Decreased step length - right;Decreased step length - left;Decreased stride length Gait velocity: slow   General Gait Details: Continuned slow gait, but able to walk much farther today; one seated rest break; noting some sway with amb, as well; Rec Rollator RW   Stairs             Wheelchair Mobility    Modified Rankin (Stroke Patients Only)       Balance                                            Cognition Arousal/Alertness: Awake/alert Behavior During Therapy: WFL  for tasks assessed/performed Overall Cognitive Status: Within Functional Limits for tasks assessed                                        Exercises      General Comments General comments (skin integrity, edema, etc.): HR range 100-124 observed max with amb; O2 sats remained greater than or equal to 94%; RN notified      Pertinent Vitals/Pain Pain Assessment: No/denies pain    Home Living                      Prior Function            PT Goals (current goals can now be found in the care plan section) Acute Rehab PT Goals Patient Stated Goal: Home soon PT Goal Formulation: With patient Time For Goal Achievement: 03/19/18 Potential to Achieve Goals: Good Progress towards PT goals: Progressing toward goals    Frequency    Min 3X/week      PT Plan Discharge plan needs to be updated;Equipment recommendations need to be updated    Co-evaluation              AM-PAC PT "6 Clicks" Mobility   Outcome Measure  Help needed turning from your back to your side  while in a flat bed without using bedrails?: None Help needed moving from lying on your back to sitting on the side of a flat bed without using bedrails?: None Help needed moving to and from a bed to a chair (including a wheelchair)?: None Help needed standing up from a chair using your arms (e.g., wheelchair or bedside chair)?: A Little Help needed to walk in hospital room?: A Little Help needed climbing 3-5 steps with a railing? : A Lot 6 Click Score: 20    End of Session Equipment Utilized During Treatment: Gait belt Activity Tolerance: Patient tolerated treatment well Patient left: in chair;with call bell/phone within reach Nurse Communication: Mobility status;Other (comment)(equipment recs) PT Visit Diagnosis: Unsteadiness on feet (R26.81);Difficulty in walking, not elsewhere classified (R26.2);Muscle weakness (generalized) (M62.81)     Time: 1500-1524(end time is  approximate) PT Time Calculation (min) (ACUTE ONLY): 24 min  Charges:  $Gait Training: 23-37 mins                     Roney Marion, [place]  Acute Rehabilitation Services Pager [phone] Office [place] 03/14/2018, 4:01 PM

## 2018-03-14 NOTE — Clinical Social Work Note (Signed)
Clinical Social Work Assessment  Patient Details  Name: Morgan Bean MRN: [id number] Date of Birth: 12/08/1965  Date of referral:  03/14/18               Reason for consult:  Facility Placement, Discharge Planning                Permission sought to share information with:  Facility Contact Representative Permission granted to share information::  Yes, Verbal Permission Granted  Name::     Natarchia Richoux  Agency::  SNFs  Relationship::  daughter  Contact Information:  [phone]  Housing/Transportation Living arrangements for the past 2 months:  Single Family Home Source of Information:  Patient Patient Interpreter Needed:  None Criminal Activity/Legal Involvement Pertinent to Current Situation/Hospitalization:  No - Comment as needed Significant Relationships:  Adult Children, Parents Lives with:  Adult Children, Parents Do you feel safe going back to the place where you live?  Yes Need for family participation in patient care:  No (Coment)  Care giving concerns: Patient from home with family. PT recommending home health or SNF.   Social Worker assessment / plan: CSW met with patient at patient's significant other at bedside. Patient had her daughter on the phone and asked that daughter be able to listen to conversation with CSW. CSW introduced self and role and discussed disposition planning - PT recommendation for SNF.  Patient is agreeable to SNF, but does not have insurance coverage. CSW indicated possibility of going to SNF under LOG, but informed patient it would have to be approved. Informed patient that otherwise, could consider home health as an option.  After consultation with CSW assistant director (AD), LOG was not approved. AD suggested home health services for patient. Updated MD and RNCM that LOG not approved. Also updated patient; patient did not have any questions. RNCM to follow for home health needs. CSW will sign off.  Employment status:  Unemployed Insurance  information:  Self Pay (Medicaid Pending) PT Recommendations:  Skilled Nursing Facility, Home with Home Health Information / Referral to community resources:  Skilled Nursing Facility  Patient/Family's Response to care: Patient appreciative of care.  Patient/Family's Understanding of and Emotional Response to Diagnosis, Current Treatment, and Prognosis: Patient with understanding of her conditions and recommendations for treatment. Patient was agreeable to rehab but LOG not approved.  Emotional Assessment Appearance:  Appears stated age Attitude/Demeanor/Rapport:  Engaged Affect (typically observed):  Accepting, Calm, Appropriate Orientation:  Oriented to Self, Oriented to Place, Oriented to  Time, Oriented to Situation Alcohol / Substance use:  Illicit Drugs(not positive on this admission but does have a history) Psych involvement (Current and /or in the community):  No (Comment)  Discharge Needs  Concerns to be addressed:  Discharge Planning Concerns, Care Coordination Readmission within the last 30 days:  No Current discharge risk:  Physical Impairment Barriers to Discharge:  Continued Medical Work up, Inadequate or no insurance    , LCSW 03/14/2018, 3:05 PM  

## 2018-03-14 NOTE — Care Management Note (Signed)
Case Management Note  Patient Details  Name: Morgan Bean MRN: [id number] Date of Birth: 06/22/1965  Subjective/Objective:   Pt presented for Acute on Chronic CHF. PTA patient from home- plan will be to transition to her mother's home from the hospital. Patient without insurance, [facility] care will be provided via [facility]. Patient is agreeable to services.                Action/Plan: Referral sent to [facility] with [facility]. SOC to begin within 24-48 hours post transition home. DME RW provided to patient. CSW to speak with patient about transportation needs. Previous CM submitted MATCH. TOC will work with patient for medications to be delivered to room prior to transition home. No further needs from CM at this time.   Expected Discharge Date:                  Expected Discharge Plan:  [place]  In-House Referral:  Clinical Social Work  Discharge planning Services  CM Consult  Post Acute Care Choice:  Home Health, Durable Medical Equipment Choice offered to:  Patient  DME Arranged:  [place] rolling DME Agency:  [place] Arranged:  RN, Disease Management, PT, OT([facility] via [place]) [place]:  [place]  Status of Service:  Completed, signed off  If discussed at [place] of Stay Meetings, dates discussed:    Additional Comments:  Bethena Roys, RN 03/14/2018, 4:02 PM

## 2018-03-15 LAB — CULTURE, BLOOD (ROUTINE X 2)
Culture: NO GROWTH
Culture: NO GROWTH
Special Requests: ADEQUATE

## 2018-03-16 NOTE — Discharge Summary (Signed)
Physician Discharge Summary  Morgan Bean JTT:[id number] DOB: 12/14/1965 DOA: 03/08/2018  PCP: Patient, No Pcp Per  Admit date: 03/08/2018 Discharge date: 03/14/2018  Admitted From: Home.  Disposition: Home.   Recommendations for Outpatient Follow-up:  1. Follow up with PCP in 1-2 weeks 2. Please obtain BMP/CBC in one week 3. Please follow up with cardiology in 1 to 2 weeks.    Home Health:yes  Discharge Condition: guarded.  CODE STATUS: full code.  Diet recommendation: Heart Healthy  Brief/Interim Summary: Morgan Bean a 52 y.o.femalewith medical history significant forpolysubstance abuse, depression with anxiety, chronic diastolic CHF, pulmonary hypertension, coronary artery disease, history of CVA, hypertension, and COPD, now presenting to the emergency department for evaluation of shortness of breath and orthopnea. The patient reports that the symptoms developed over the past week and have been worsening.Chest x-ray is notable for cardiomegaly with interstitial and alveolar pulmonary edema.  Discharge Diagnoses:  Principal Problem:   Acute on chronic diastolic CHF (congestive heart failure) ([name]) Active Problems:   Major depressive disorder, recurrent episode with psychotic features.   History of cocaine abuse ([place])   Polysubstance abuse ([place])   History of stroke   COPD with chronic bronchitis ([place])   Hypertensive urgency   Atrial tachycardia ([name])   Acute respiratory failure with hypoxia ([name])   Persistent atrial fibrillation   CHF exacerbation ([name])   Acute pulmonary edema ([name])   Atrial fibrillation with RVR ([name])   Nonrheumatic mitral valve regurgitation   Acute respiratory failure with hypoxia secondary to acute on chronic diastolic heart failure Improved.  Continue with IV Lasix 40 mg daily creatinine stable on IV Lasix.  monitor urine output, with strict intake and output and daily weights.    CTA negative for PE but findings consistent with acute  CHF. Echocardiogram reviewed.  Cardiology consulted for recommendations. Discharged the patient on 40 mg of lasix BID. Recommend outpatient follow up with cardiology in 1 to 2 weeks and recommend checking BMP.   Atrial tachycardia Patient was started on IV Cardizem gtt. and transition to oral Cardizem by cardiology.  TSH within normal limits.   Troponin negative at this time. Patient denies any chest pain. Underwent TEE/DCCV on 03/11/2018 and she was started on anticoagulation by cardiology. She is back in A. fib with rates between 90s to 110, currently on Cardizem 120 mg daily, Xarelto for anticoagulation.added another 60 mg of cardizem, after discussing with cardiology and discharged the patient on  Cardizem to 180 mg.    History of polysubstance abuse  counseled towards cessation Will need social worker consult for resources.   Hypertensive urgency Resolved. Blood pressure parameters are well within normal limits.    Depression, anxiety and insomnia Continue with Wellbutrin and Celexa and trazodone.    History of nonhemorrhagic CVA Continue with aspirin and statin.   History of COPD Stable no wheezing heard on exam.   Hypokalemia Replaced.     Discharge Instructions  Discharge Instructions    (HEART FAILURE PATIENTS) Call MD:  Anytime you have any of the following symptoms: 1) 3 pound weight gain in 24 hours or 5 pounds in 1 week 2) shortness of breath, with or without a dry hacking cough 3) swelling in the hands, feet or stomach 4) if you have to sleep on extra pillows at night in order to breathe.   Complete by:  As directed    Diet - low sodium heart healthy   Complete by:  As directed    Discharge instructions  Complete by:  As directed    PLEASE follow up with cardiology in one week.  Please follow up with PCP in one week.     Allergies as of 03/14/2018      Reactions   Ciprofloxacin Itching      Medication List    STOP taking  these medications   amLODipine 10 MG tablet Commonly known as:  NORVASC   aspirin EC 325 MG tablet   lisinopril 10 MG tablet Commonly known as:  PRINIVIL,ZESTRIL   loratadine 10 MG tablet Commonly known as:  CLARITIN     TAKE these medications   albuterol 108 (90 Base) MCG/ACT inhaler Commonly known as:  PROVENTIL HFA;VENTOLIN HFA Inhale 2 puffs into the lungs every 6 (six) hours as needed for wheezing or shortness of breath.   atorvastatin 40 MG tablet Commonly known as:  LIPITOR TAKE 1 TABLET BY MOUTH IN THE EVENING What changed:  when to take this   buPROPion 150 MG 12 hr tablet Commonly known as:  WELLBUTRIN SR Take 1 tablet (150 mg total) by mouth 2 (two) times daily.   citalopram 40 MG tablet Commonly known as:  CELEXA Take 1 tablet (40 mg total) by mouth daily.   diltiazem 180 MG 24 hr capsule Commonly known as:  CARDIZEM CD Take 1 capsule (180 mg total) by mouth daily.   furosemide 20 MG tablet Commonly known as:  LASIX Take 2 tablets (40 mg total) by mouth 2 (two) times daily. What changed:    how much to take  additional instructions   hydrOXYzine 25 MG capsule Commonly known as:  VISTARIL Take 1 capsule (25 mg total) by mouth 4 (four) times daily as needed for anxiety.   mometasone-formoterol 100-5 MCG/ACT Aero Commonly known as:  DULERA Inhale 2 puffs into the lungs 2 (two) times daily.   ranitidine 150 MG tablet Commonly known as:  ZANTAC Take 1 tablet (150 mg total) by mouth 2 (two) times daily.   rivaroxaban 20 MG Tabs tablet Commonly known as:  XARELTO Take 1 tablet (20 mg total) by mouth daily with supper.   topiramate 25 MG tablet Commonly known as:  TOPAMAX Take 2 tablets (50 mg total) by mouth at bedtime.   traZODone 150 MG tablet Commonly known as:  DESYREL Take 150 mg by mouth at bedtime.      Follow-up Information    [facility] Go on 04/11/2018.   Why:  appointment scheduled for 2:10pm.  Please arrive 15 minutes early and reschedule if you are unable to go. This is to establish your Primary Care Provider. As a patient here you will be able to use the low cost pharmacy at [place]. Contact information: [place] [address] [place] [facility] Follow up.   Why:  you will be able to use the low cost pharmacy at this location as a patient of the [facility].  Contact information: [place] [id number] [place], [facility] Follow up.   Specialty:  Home Health Services Why:  Registered Nurse, Physical Therapy, Occupational Therapy Contact information: [address] [place] [address] [phone]        [name], MD. Schedule an appointment as soon as possible for a visit in 1 week(s).   Specialty:  Cardiology Contact information: [address]  [address] [place] [address] [phone]          Allergies  Allergen Reactions  . Ciprofloxacin Itching    Consultations:  Cardiology  Social work    Procedures/Studies: Dg Chest 2 View  Result Date: 03/08/2018 CLINICAL DATA:  52 y/o  F; 1 week of shortness of breath. EXAM: CHEST - 2 VIEW COMPARISON:  06/15/2017 chest radiograph FINDINGS: Stable cardiomegaly given projection and technique. Calcific aortic atherosclerosis. Diffuse reticular and patchy opacities of the lungs with basilar predominance. Small bilateral pleural effusions packing along fissures. No pneumothorax. Bones are unremarkable. IMPRESSION: Cardiomegaly with interstitial and alveolar pulmonary edema. Small bilateral effusions. Electronically Signed   By: Kristine Garbe M.D.   On: 03/08/2018 19:35   Ct Angio Chest Pe W And/or Wo Contrast  Result Date: 03/08/2018 CLINICAL DATA:  Dyspnea x1 week EXAM: CT ANGIOGRAPHY CHEST WITH CONTRAST  TECHNIQUE: Multidetector CT imaging of the chest was performed using the standard protocol during bolus administration of intravenous contrast. Multiplanar CT image reconstructions and MIPs were obtained to evaluate the vascular anatomy. CONTRAST:  134mL ISOVUE-370 IOPAMIDOL (ISOVUE-370) INJECTION 76% COMPARISON:  CXR from earlier on the same day and 06/15/2017, chest CT 04/12/2014 FINDINGS: Cardiovascular: Stable cardiomegaly without pericardial effusion or thickening. Satisfactory opacification of the pulmonary arteries to the segmental level without acute pulmonary embolus. Ectatic thoracic aorta to 3.8 cm along the ascending portion. Conventional branch pattern of the great vessels with minimal atherosclerosis. Scattered coronary arteriosclerosis is identified. Mediastinum/Nodes: Midline patent trachea and mainstem bronchi. No pathologically enlarged mediastinal, hilar, axillary or supraclavicular nodes. No thyromegaly or visualized mass. Lungs/Pleura: Small to moderate right effusion with adjacent atelectasis. Minimal atelectasis at the left lung base. Central vascular congestion mild ground-glass opacities noted throughout both aerated lungs compatible with stigmata of CHF. Streaky subsegmental atelectasis and/or scarring is seen at each base and along the fissures. Upper Abdomen: Reflux of contrast into the hepatic veins compatible with a component of right heart failure. Musculoskeletal: No chest wall abnormality. No acute or significant osseous findings. Review of the MIP images confirms the above findings. IMPRESSION: 1. No acute pulmonary embolus. 2. Small to moderate right pleural effusion with adjacent atelectasis. 3. Cardiomegaly with diffuse ground-glass opacities of the lungs bilaterally. This in conjunction with the pleural effusions would be in keeping with mild CHF. Aortic Atherosclerosis (ICD10-I70.0). Electronically Signed   By: Ashley Royalty M.D.   On: 03/08/2018 21:11        Subjective: Denies any chest pain or palpitations. Refusing SNF at this time.  Wants to go home.   Discharge Exam: Vitals:   03/14/18 1317 03/14/18 1352  BP: 105/80 102/90  Pulse:  86  Resp:    Temp:  97.9 F (36.6 C)  SpO2:  97%   Vitals:   03/14/18 0819 03/14/18 0917 03/14/18 1317 03/14/18 1352  BP: (!) 121/101  105/80 102/90  Pulse:    86  Resp:      Temp:    97.9 F (36.6 C)  TempSrc:    Oral  SpO2:  97%  97%  Weight:      Height:        General: Pt is alert, awake, not in acute distress Cardiovascular: irregular, 78/min.  S1/S2 +, no rubs, no gallops Respiratory: CTA bilaterally, no wheezing, no rhonchi Abdominal: Soft, NT, ND, bowel sounds + Extremities: no edema, no cyanosis    The results of significant diagnostics from this hospitalization (including imaging, microbiology, ancillary and laboratory) are listed below for  reference.     Microbiology: Recent Results (from the past 240 hour(s))  Culture, blood (routine x 2)     Status: None   Collection Time: 03/10/18  2:54 PM  Result Value Ref Range Status   Specimen Description BLOOD LEFT ANTECUBITAL  Final   Special Requests   Final    BOTTLES DRAWN AEROBIC ONLY Blood Culture adequate volume   Culture   Final    NO GROWTH 5 DAYS Performed at [facility], [address], [place], [place] [address]    Report Status 03/15/2018 FINAL  Final  Culture, blood (routine x 2)     Status: None   Collection Time: 03/10/18  2:59 PM  Result Value Ref Range Status   Specimen Description BLOOD RIGHT HAND  Final   Special Requests   Final    BOTTLES DRAWN AEROBIC ONLY Blood Culture results may not be optimal due to an inadequate volume of blood received in culture bottles   Culture   Final    NO GROWTH 5 DAYS Performed at [facility], [place] [address] [place], [place] [address]    Report Status 03/15/2018 FINAL  Final     Labs: BNP (last 3 results) Recent Labs    03/08/18 1746  BNP  993.7*   Basic Metabolic Panel: Recent Labs  Lab 03/10/18 0325 03/11/18 0456 03/12/18 0347 03/13/18 0521  NA 139 138 137 137  K 3.6 3.8 3.6 3.6  CL 107 109 108 109  CO2 24 20* 20* 19*  GLUCOSE 94 90 85 91  BUN 22* 21* 19 18  CREATININE 1.50* 1.43* 1.39* 1.40*  CALCIUM 8.6* 8.7* 8.8* 9.1   Liver Function Tests: No results for input(s): AST, ALT, ALKPHOS, BILITOT, PROT, ALBUMIN in the last 168 hours. No results for input(s): LIPASE, AMYLASE in the last 168 hours. No results for input(s): AMMONIA in the last 168 hours. CBC: Recent Labs  Lab 03/11/18 0456  WBC 5.2  HGB 11.1*  HCT 37.0  MCV 79.1*  PLT 156   Cardiac Enzymes: No results for input(s): CKTOTAL, CKMB, CKMBINDEX, TROPONINI in the last 168 hours. BNP: Invalid input(s): POCBNP CBG: No results for input(s): GLUCAP in the last 168 hours. D-Dimer No results for input(s): DDIMER in the last 72 hours. Hgb A1c No results for input(s): HGBA1C in the last 72 hours. Lipid Profile No results for input(s): CHOL, HDL, LDLCALC, TRIG, CHOLHDL, LDLDIRECT in the last 72 hours. Thyroid function studies No results for input(s): TSH, T4TOTAL, T3FREE, THYROIDAB in the last 72 hours.  Invalid input(s): FREET3 Anemia work up No results for input(s): VITAMINB12, FOLATE, FERRITIN, TIBC, IRON, RETICCTPCT in the last 72 hours. Urinalysis    Component Value Date/Time   COLORURINE YELLOW 09/20/2013 1805   APPEARANCEUR CLEAR 09/20/2013 1805   LABSPEC 1.022 09/20/2013 1805   PHURINE 5.5 09/20/2013 1805   GLUCOSEU NEG 09/20/2013 1805   HGBUR NEG 09/20/2013 1805   BILIRUBINUR NEG 04/23/2014 1245   KETONESUR NEG 09/20/2013 1805   PROTEINUR NEG 04/23/2014 1245   PROTEINUR NEG 09/20/2013 1805   UROBILINOGEN 0.2 04/23/2014 1245   UROBILINOGEN 0.2 09/20/2013 1805   NITRITE NEG 04/23/2014 1245   NITRITE NEG 09/20/2013 1805   LEUKOCYTESUR Negative 04/23/2014 1245   Sepsis Labs Invalid input(s): PROCALCITONIN,  WBC,   LACTICIDVEN Microbiology Recent Results (from the past 240 hour(s))  Culture, blood (routine x 2)     Status: None   Collection Time: 03/10/18  2:54 PM  Result  Value Ref Range Status   Specimen Description BLOOD LEFT ANTECUBITAL  Final   Special Requests   Final    BOTTLES DRAWN AEROBIC ONLY Blood Culture adequate volume   Culture   Final    NO GROWTH 5 DAYS Performed at [facility], [address], [place], [place] [address]    Report Status 03/15/2018 FINAL  Final  Culture, blood (routine x 2)     Status: None   Collection Time: 03/10/18  2:59 PM  Result Value Ref Range Status   Specimen Description BLOOD RIGHT HAND  Final   Special Requests   Final    BOTTLES DRAWN AEROBIC ONLY Blood Culture results may not be optimal due to an inadequate volume of blood received in culture bottles   Culture   Final    NO GROWTH 5 DAYS Performed at [facility], [place] [address], [place], [place] [address]    Report Status 03/15/2018 FINAL  Final     Time coordinating discharge: 34 minutes  SIGNED:   Hosie Poisson, MD  [facility] 03/16/2018, 12:24 PM Pager   If 7PM-7AM, please contact night-coverage [email or web address] Password [email or web address]

## 2018-03-25 ENCOUNTER — Ambulatory Visit (INDEPENDENT_AMBULATORY_CARE_PROVIDER_SITE_OTHER): Payer: Self-pay | Admitting: Cardiovascular Disease

## 2018-03-25 ENCOUNTER — Encounter: Payer: Self-pay | Admitting: Cardiovascular Disease

## 2018-03-25 VITALS — BP 148/108 | HR 78 | Ht 66.0 in | Wt 164.8 lb

## 2018-03-25 DIAGNOSIS — E7800 Pure hypercholesterolemia, unspecified: Secondary | ICD-10-CM

## 2018-03-25 DIAGNOSIS — I2510 Atherosclerotic heart disease of native coronary artery without angina pectoris: Secondary | ICD-10-CM

## 2018-03-25 DIAGNOSIS — I4819 Other persistent atrial fibrillation: Secondary | ICD-10-CM

## 2018-03-25 DIAGNOSIS — Z720 Tobacco use: Secondary | ICD-10-CM

## 2018-03-25 DIAGNOSIS — I5032 Chronic diastolic (congestive) heart failure: Secondary | ICD-10-CM

## 2018-03-25 DIAGNOSIS — I10 Essential (primary) hypertension: Secondary | ICD-10-CM

## 2018-03-25 MED ORDER — LISINOPRIL 10 MG PO TABS: 10.0000 mg | ORAL_TABLET | Freq: Every day | ORAL | 3 refills | Status: DC

## 2018-03-25 NOTE — Progress Notes (Signed)
Cardiology Office Note   Date:  03/25/2018   ID:  Morgan Bean, DOB 03/31/1965, MRN [id number]  PCP:  Patient, No Pcp Per  Cardiologist:   [name], MD   Chief Complaint  Patient presents with  . Follow-up    pt denied chest pain and SOB      History of Present Illness: Morgan Bean is a 53 y.o. female with chronic diastolic heart failure, mild pulmonary hypertension, prior CVA, CAD, atrial flutter s/p ablation, COPD, OSA not on CPAP, polysubstance abuse, depression, and anxiety here for follow up.  She was admitted 02/2018 with acute on chronic diastolic heart failure and atrial fibrillation/flutter with RVR.  She presented with 4 days of shortness of breath that occurred in the setting of heavy cocaine abuse.  She had not been taking her furosemide and reported that she often forgets to take her medications.  Echo that admission revealed LVEF 50 to 55% with mild aortic regurgitation and moderate to severe mitral regurgitation.  She was started on diltiazem and Xarelto.    Since being discharged Morgan Bean has been feeling well.  She reports that she always has a a little chest pain.  This is chronic for her.  It does not change with exertion or position and is constantly there.  She has no shortness of breath or lower extremity edema.  She denies orthopnea or PND.  Her BP at home has been around 140/80.  While in the hospital her BP was low.  Her home amlodipine and lisinopril were discontinued.  She hasn't noted any palpitations.  She has been taking her medication as prescribed and abstaining from cocaine.   Past Medical History:  Diagnosis Date  . Anemia of other chronic disease 11/13/2013  . Anginal pain (Morgan Bean)    none in past year  . Anxiety   . Asthma   . Candidiasis 06/23/2013  . Chronic diastolic CHF (congestive heart failure) ([place])   . Constipation   . Coronary artery disease    Lexiscan myoview (10/12) with significant ST depression upon Lexiscan injection but no  ischemia or infarction on perfusion images. Left heart cath (10/12): 30% mid LAD, 30% ostial D1, 50% ostial D2.    [name] Cough 11/13/2013  . DCIS (ductal carcinoma in situ) of breast   . Depression   . Headache 02/22/2014  . Hx of cardiovascular stress test    Lexiscan Myoview (2/16): Breast attenuation artifact, no ischemia, EF 64%; low risk  . Hyperlipemia   . Hypertension   . Iron deficiency    hx of  . Leukocytoclastic vasculitis ([place])    Rash across lower body, occurred in 9/11, diagnosed by skin biopsy. ANA positive. Thought to be secondary to cocaine use.  . Leukopenia 03/21/2013  . Lymphadenopathy 03/21/2013  . Lymphadenopathy 01/30/2014  . Mental disorder   . Pancytopenia ([place])   . Pancytopenia, acquired ([place]) 07/16/2015  . Polysubstance abuse ([place])    Prior cocaine  . Pulmonary HTN ([place])    Echo (9/11) with EF 65%, mild LVH, mild AI, mild MR, moderate to possibly severe TR with PA systolic pressure 52 mmHg. Echo (10/12): severe LV hypertrophy, EF 60-65%, mild MR, mild AI, moderate to severe tricuspid regurgitation, PA systolic pressure 38 mmHg.  Echo 4/14: moderate LVH, EF 65%, normal wall motion, diastolic dysfunction, mild AI, mild MR  . Shortness of breath    a. PFTs 5/14:  FEV1/FVC 99% predicted; FVC 60% predicted; DLCO mildly reduced, mild restriction, air trapping.;  b.  seen by pulmo (Dr. [name]) 5/14: mainly upper airway symptoms - ACE d/c'd (sx's better)  . Stroke ([place]) 2010ish  . Syphilis 04/25/2013  . Tobacco abuse   . Tricuspid regurgitation   . Unspecified deficiency anemia 03/29/2013    Past Surgical History:  Procedure Laterality Date  . ANKLE SURGERY     right  . BONE MARROW ASPIRATION    . CARDIAC CATHETERIZATION    . CARDIAC CATHETERIZATION N/A 04/23/2015   Procedure: Right Heart Cath;  Surgeon: [name], MD;  Location: [facility];  Service: Cardiovascular;  Laterality: N/A;  . CARDIOVERSION N/A 03/11/2018   Procedure: CARDIOVERSION;  Surgeon:  [name], MD;  Location: [facility] ENDOSCOPY;  Service: Cardiovascular;  Laterality: N/A;  . I&D EXTREMITY  08/09/2011   Procedure: IRRIGATION AND DEBRIDEMENT EXTREMITY;  Surgeon: [name], MD;  Location: [facility];  Service: General;  Laterality: Left;  i & D Left axilla abscess  . LYMPH NODE BIOPSY N/A 04/05/2013   Procedure: EXCISIONAL BIOPSY RIGHT SUBMANDIBULAR NODE, NASAL ENDOSCOPIC WITH BIOPSY NASAL PHARYNX;  Surgeon: [name], MD;  Location: [facility];  Service: ENT;  Laterality: N/A;  . TEE WITHOUT CARDIOVERSION N/A 03/11/2018   Procedure: TRANSESOPHAGEAL ECHOCARDIOGRAM (TEE);  Surgeon: [name], MD;  Location: [facility] ENDOSCOPY;  Service: Cardiovascular;  Laterality: N/A;     Current Outpatient Medications  Medication Sig Dispense Refill  . albuterol (PROVENTIL HFA;VENTOLIN HFA) 108 (90 Base) MCG/ACT inhaler Inhale 2 puffs into the lungs every 6 (six) hours as needed for wheezing or shortness of breath. 54 Inhaler 3  . atorvastatin (LIPITOR) 40 MG tablet TAKE 1 TABLET BY MOUTH IN THE EVENING (Patient taking differently: Take 40 mg by mouth daily at 6 PM. ) 30 tablet 2  . buPROPion (WELLBUTRIN SR) 150 MG 12 hr tablet Take 1 tablet (150 mg total) by mouth 2 (two) times daily. 60 tablet 0  . citalopram (CELEXA) 40 MG tablet Take 1 tablet (40 mg total) by mouth daily. 30 tablet 1  . diltiazem (CARDIZEM CD) 180 MG 24 hr capsule Take 1 capsule (180 mg total) by mouth daily. 60 capsule 1  . furosemide (LASIX) 20 MG tablet Take 2 tablets (40 mg total) by mouth 2 (two) times daily. 30 tablet 3  . hydrOXYzine (VISTARIL) 25 MG capsule Take 1 capsule (25 mg total) by mouth 4 (four) times daily as needed for anxiety. 30 capsule 0  . mometasone-formoterol (DULERA) 100-5 MCG/ACT AERO Inhale 2 puffs into the lungs 2 (two) times daily. 13 g 1  . ranitidine (ZANTAC) 150 MG tablet Take 1 tablet (150 mg total) by mouth 2 (two) times daily. 60 tablet 2  . rivaroxaban (XARELTO) 20 MG TABS tablet Take 1  tablet (20 mg total) by mouth daily with supper. 30 tablet 1  . topiramate (TOPAMAX) 25 MG tablet Take 2 tablets (50 mg total) by mouth at bedtime. 60 tablet 1  . traZODone (DESYREL) 150 MG tablet Take 150 mg by mouth at bedtime.  0  . lisinopril (PRINIVIL,ZESTRIL) 10 MG tablet Take 1 tablet (10 mg total) by mouth daily. 90 tablet 3   No current facility-administered medications for this visit.     Allergies:   Ciprofloxacin    Social History:  The patient  reports that she has been smoking cigarettes. She has been smoking about 0.00 packs per day for the past 30.00 years. She has never used smokeless tobacco. She reports that she does not  drink alcohol or use drugs.   Family History:  The patient's family history includes Breast cancer in her maternal grandmother; Coronary artery disease in her father and mother; Diabetes in her father, maternal grandmother, and mother; Heart attack in her father and maternal grandmother; Hypertension in her father; Stroke in her maternal grandmother.    ROS:  Please see the history of present illness.   Otherwise, review of systems are positive for none.   All other systems are reviewed and negative.    PHYSICAL EXAM: VS:  BP (!) 148/108   Pulse 78   Ht 5\' 6"  (1.676 m)   Wt 164 lb 12.8 oz (74.8 kg)   LMP 05/10/2013   BMI 26.60 kg/m  , BMI Body mass index is 26.6 kg/m. GENERAL:  Well appearing HEENT:  Pupils equal round and reactive, fundi not visualized, oral mucosa unremarkable NECK:  No jugular venous distention, waveform within normal limits, carotid upstroke brisk and symmetric, no bruits LUNGS:  Clear to auscultation bilaterally HEART:  Irregularly irregular.  PMI not displaced or sustained,S1 and S2 within normal limits, no S3, no S4, no clicks, no rubs, III/VI systolic murmurs ABD:  Flat, positive bowel sounds normal in frequency in pitch, no bruits, no rebound, no guarding, no midline pulsatile mass, no hepatomegaly, no splenomegaly EXT:   2 plus pulses throughout, no edema, no cyanosis no clubbing SKIN:  No rashes no nodules NEURO:  Cranial nerves II through XII grossly intact, motor grossly intact throughout PSYCH:  Cognitively intact, oriented to person place and time   EKG:  EKG is ordered today. The ekg ordered today demonstrates atrial flutter.  Variable ventricular response.  Ventricular rate 78 bpm.  Echo 03/09/18: Study Conclusions  - Left ventricle: The cavity size was normal. Wall thickness was   normal. Systolic function was normal. The estimated ejection   fraction was in the range of 50% to 55%. The study is not   technically sufficient to allow evaluation of LV diastolic   function. - Aortic valve: There was mild regurgitation. - Mitral valve: There was moderate to severe regurgitation. - Left atrium: The atrium was severely dilated. - Atrial septum: No defect or patent foramen ovale was identified. - Tricuspid valve: There was moderate regurgitation. - Pulmonary arteries: PA peak pressure: 39 mm Hg (S).  TEE 03/11/18: Study Conclusions  - Left ventricle: LVEF is depressed. - Mitral valve: MV is mildly thickened. Leaflets do not appear to   coapt well Annular distance is 39 mm There is moderate to severe   MR directed toward back of LA> - Left atrium: No evidence of thrombus in the atrial cavity or   appendage.  Impressions:  - Successful cardioversion. No cardiac source of emboli was   indentified.  Recent Labs: 03/08/2018: B Natriuretic Peptide 497.1 03/09/2018: Magnesium 2.2; TSH 2.563 03/11/2018: Hemoglobin 11.1; Platelets 156 03/13/2018: BUN 18; Creatinine, Ser 1.40; Potassium 3.6; Sodium 137    Lipid Panel    Component Value Date/Time   CHOL 154 08/21/2015 1212   TRIG 109 08/21/2015 1212   HDL 43 (L) 08/21/2015 1212   CHOLHDL 3.6 08/21/2015 1212   VLDL 22 08/21/2015 1212   LDLCALC 89 08/21/2015 1212   LDLDIRECT 142.1 12/25/2010 0846      Wt Readings from Last 3  Encounters:  03/25/18 164 lb 12.8 oz (74.8 kg)  03/14/18 154 lb 12.8 oz (70.2 kg)  11/10/17 175 lb 12.8 oz (79.7 kg)      ASSESSMENT AND PLAN:  #  Persistent atrial flutter:  Ms. Smick previously underwent ablation and had a DCCV 02/2018.  She is back in atrial flutter but rates are controlled and she is asymptomatic.  She has moderate to severe MR and thus is unlikely to maintain sinus rhythm, especially without an antiarrhythmic.  We will plan to continue with rate control.  Continue diltiazem and Xarelto.  # Moderate to severe MR:  She is euvolemic.  Continue furosemide.  She is not a good candidate for surgical repair/replacement at this time given her drug use.  Recommend continued abstinence and medical management for now.  # Hypertension:  BP above goal.  Resume lisinopril 10mg  daily.  Check BMP in 1 week.  Continue diltiazem.  # Chronic diastolic heart failure: She is euvolemic.  Continue BP control and lasix as above.  # CAD: Non-obstructive on cath in 2012.  No ischemia on stress 11/2015.  # Hyperlipidemia:  Continue atorvastatin.  # Prior stroke: Aspirin discontinued when she started Xarelto.  # Polysubstance abuse: Congratulated her on abstaining from cocaine.  Current medicines are reviewed at length with the patient today.  The patient does not have concerns regarding medicines.  The following changes have been made:  Add lisinopril.  Labs/ tests ordered today include:   Orders Placed This Encounter  Procedures  . Basic metabolic panel  . EKG 12-Lead     Disposition:   FU with Raimi Guillermo C. Oval Linsey, MD, [facility] in 4 months.  PharmD for BP in 1 month.     Signed, Iyona Pehrson C. Oval Linsey, MD, [facility]  03/25/2018 9:08 AM    [place]

## 2018-03-25 NOTE — Patient Instructions (Signed)
Medication Instructions:  START LISINOPRIL 10 MG ONCE DAILY If you need a refill on your cardiac medications before your next appointment, please call your pharmacy.   Lab work: Your physician recommends that you return for lab work in: [place] If you have labs (blood work) drawn today and your tests are completely normal, you will receive your results only by: [name] MyChart Message (if you have MyChart) OR . A paper copy in the mail If you have any lab test that is abnormal or we need to change your treatment, we will call you to review the results.  Follow-Up: At [facility], you and your health needs are our priority.  As part of our continuing mission to provide you with exceptional heart care, we have created designated Provider Care Teams.  These Care Teams include your primary Cardiologist (physician) and Advanced Practice Providers (APPs -  Physician Assistants and Nurse Practitioners) who all work together to provide you with the care you need, when you need it.  Your physician recommends that you schedule a follow-up appointment in: [place]  Your physician recommends that you schedule a follow-up appointment in: [place] [address] [facility]

## 2018-04-11 ENCOUNTER — Ambulatory Visit (INDEPENDENT_AMBULATORY_CARE_PROVIDER_SITE_OTHER): Payer: Self-pay | Admitting: Internal Medicine

## 2018-04-11 ENCOUNTER — Other Ambulatory Visit: Payer: Self-pay

## 2018-04-11 ENCOUNTER — Encounter (INDEPENDENT_AMBULATORY_CARE_PROVIDER_SITE_OTHER): Payer: Self-pay | Admitting: Internal Medicine

## 2018-04-11 VITALS — BP 138/94 | HR 101 | Temp 97.3°F | Resp 22 | Wt 168.0 lb

## 2018-04-11 DIAGNOSIS — I4819 Other persistent atrial fibrillation: Secondary | ICD-10-CM

## 2018-04-11 DIAGNOSIS — F339 Major depressive disorder, recurrent, unspecified: Secondary | ICD-10-CM | POA: Insufficient documentation

## 2018-04-11 DIAGNOSIS — F17201 Nicotine dependence, unspecified, in remission: Secondary | ICD-10-CM | POA: Insufficient documentation

## 2018-04-11 DIAGNOSIS — I5032 Chronic diastolic (congestive) heart failure: Secondary | ICD-10-CM

## 2018-04-11 DIAGNOSIS — I10 Essential (primary) hypertension: Secondary | ICD-10-CM

## 2018-04-11 DIAGNOSIS — F1911 Other psychoactive substance abuse, in remission: Secondary | ICD-10-CM | POA: Insufficient documentation

## 2018-04-11 DIAGNOSIS — F418 Other specified anxiety disorders: Secondary | ICD-10-CM

## 2018-04-11 DIAGNOSIS — D509 Iron deficiency anemia, unspecified: Secondary | ICD-10-CM

## 2018-04-11 MED ORDER — BUPROPION HCL ER (SR) 150 MG PO TB12: 150.0000 mg | ORAL_TABLET | Freq: Two times a day (BID) | ORAL | 2 refills | Status: DC

## 2018-04-11 MED ORDER — CLONIDINE HCL 0.1 MG PO TABS: 0.1000 mg | ORAL_TABLET | Freq: Two times a day (BID) | ORAL | 3 refills | Status: DC

## 2018-04-11 MED ORDER — TRAZODONE HCL 150 MG PO TABS: 150.0000 mg | ORAL_TABLET | Freq: Every day | ORAL | 2 refills | Status: DC

## 2018-04-11 MED ORDER — RIVAROXABAN 20 MG PO TABS: 20.0000 mg | ORAL_TABLET | Freq: Every day | ORAL | 2 refills | Status: DC

## 2018-04-11 MED ORDER — CITALOPRAM HYDROBROMIDE 40 MG PO TABS: 40.0000 mg | ORAL_TABLET | Freq: Every day | ORAL | 2 refills | Status: DC

## 2018-04-11 MED ORDER — DILTIAZEM HCL ER COATED BEADS 180 MG PO CP24: 180.0000 mg | ORAL_CAPSULE | Freq: Every day | ORAL | 2 refills | Status: DC

## 2018-04-11 NOTE — Progress Notes (Signed)
Concerns with Lisinopril. Was taken off while in the hospital  Unsteady gait. No falls at home. Uses walker. Has Home Health: RN/PT   Attn to PHQ-9. Question # 9. MD aware. Notified LCSW- Farrel Demark with secure chat

## 2018-04-11 NOTE — Progress Notes (Addendum)
Patient ID: Morgan Bean, female    DOB: 04/29/1965  MRN: [id number]  CC: Hospitalization Follow-up   Subjective: Morgan Bean is a 53 y.o. female who presents for hospital follow-up.  Patient seen at [facility] clinic.Louretta Shorten, daughter is with her.   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 fib/a flutter, polysubstance abuse and leukocytoclastic vasculitis  Patient hospitalized 12/17-23/2019 with acute toxic respiratory failure secondary to acute on chronic diastolic CHF.  Patient was diuresed appropriately.  She was found to have atrial tachycardia likely A. fib/flutter.  TSH normal.  TEE was done followed by [facility].  Patient started on anticoagulation.  However she converted back to A. fib/flutter.  Rate control attempted with Cardizem.  Patient was seen by Dr. [name], cardiologist, on 03/25/2018 post hospital discharge.  Weight on that visit was 164 pounds. Lisinopril restarted. -mother checks her BP but she does not keep log.  Gives range 90-140/90 or lower -took meds already.  Tries to limit salt but ate some salted crackers this morning  -no LE edema or SOB. Sleeping on one pillow. -no bruising on Xarelto  Tob dep/COPD:  Quit 1 mth ago. Using [facility]  As prescribed  Subst Abuse:  Quit 1 mth ago.is not in any treatment program.  She states that she is keeping herself clean by staying away from friends who do drugs and she has good family support including her daughter.      Depression/Anxiety:  On Wellbutrin and Celexa.  Was going to a [place] in [place] but moved back to [place] 1 yr ago. Would like to get in at [facility] in past but not lately  On Topamax for HA.  Reports that she has has 3 inoperable brain aneurysms that were being followed at [facility] neurosurgery.  Last seen there in September  2018.   Patient Active Problem List   Diagnosis Date Noted  . Persistent atrial fibrillation 03/10/2018  . CHF exacerbation ([place]) 03/10/2018  . Acute pulmonary edema ([name])   . Atrial fibrillation with RVR ([place])   . Nonrheumatic mitral valve regurgitation   . Acute on chronic diastolic CHF (congestive heart failure) ([place]) 03/08/2018  . Hypertensive urgency 03/08/2018  . Atrial tachycardia ([place]) 03/08/2018  . Acute respiratory failure with hypoxia ([place]) 03/08/2018  . Pancytopenia, acquired ([place]) 07/16/2015  . Head and neck lymphadenopathy 07/16/2015  . COPD with chronic bronchitis ([place]) 05/08/2015  . Absolute anemia 02/25/2015  . Preventive measure 12/27/2014  . 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 History of stroke 12/26/2014  . 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 Headache 02/22/2014  . Dental caries 01/09/2014  . Cough 11/13/2013  . Tobacco abuse 11/13/2013  . Swelling of gums 11/13/2013  . Fatigue 11/10/2013  . Intracranial atherosclerosis 11/10/2013  . Headache(784.0) 10/04/2013  . Hyperreflexia 10/04/2013  . TIA (transient ischemic attack) 10/04/2013  . 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 Fibroid uterus 09/14/2012  . Perimenopause 09/14/2012  . Chronic cough 08/12/2012  . History of CVA (cerebrovascular accident) 08/09/2012  . Chest pain, mid sternal 04/07/2012  . Pulmonary hypertension ([place]) 04/06/2012  . Polysubstance abuse ([place]) 04/06/2012  . History of cocaine abuse ([place]) 11/16/2011  . Major depressive disorder, recurrent episode with psychotic features. 11/15/2011    Class: Acute  . Chronic diastolic heart failure ([place]) 02/24/2011  . CAD (coronary artery disease) 02/24/2011  . Hyperlipidemia 01/06/2011  .  Hypertension 12/15/2010  . Chest pain 12/15/2010  . Exertional dyspnea 12/15/2010  . Leukocytoclastic vasculitis ([place]) 03/24/2007     Current Outpatient Medications on File Prior to Visit  Medication Sig Dispense Refill  . albuterol  (PROVENTIL HFA;VENTOLIN HFA) 108 (90 Base) MCG/ACT inhaler Inhale 2 puffs into the lungs every 6 (six) hours as needed for wheezing or shortness of breath. 54 Inhaler 3  . atorvastatin (LIPITOR) 40 MG tablet TAKE 1 TABLET BY MOUTH IN THE EVENING (Patient taking differently: Take 40 mg by mouth daily at 6 PM. ) 30 tablet 2  . furosemide (LASIX) 20 MG tablet Take 2 tablets (40 mg total) by mouth 2 (two) times daily. 30 tablet 3  . hydrOXYzine (VISTARIL) 25 MG capsule Take 1 capsule (25 mg total) by mouth 4 (four) times daily as needed for anxiety. 30 capsule 0  . lisinopril (PRINIVIL,ZESTRIL) 10 MG tablet Take 1 tablet (10 mg total) by mouth daily. 90 tablet 3  . mometasone-formoterol (DULERA) 100-5 MCG/ACT AERO Inhale 2 puffs into the lungs 2 (two) times daily. 13 g 1  . ranitidine (ZANTAC) 150 MG tablet Take 1 tablet (150 mg total) by mouth 2 (two) times daily. 60 tablet 2  . topiramate (TOPAMAX) 25 MG tablet Take 2 tablets (50 mg total) by mouth at bedtime. 60 tablet 1   No current facility-administered medications on file prior to visit.     Allergies  Allergen Reactions  . Ciprofloxacin Itching    Social History   Socioeconomic History  . Marital status: Single    Spouse name: Not on file  . Number of children: 2  . Years of education: Not on file  . Highest education level: Not on file  Occupational History  . Occupation: unemployed  Social Needs  . Financial resource strain: Not on file  . Food insecurity:    Worry: Not on file    Inability: Not on file  . Transportation needs:    Medical: Not on file    Non-medical: Not on file  Tobacco Use  . Smoking status: Current Every Day Smoker    Packs/day: 0.00    Years: 30.00    Pack years: 0.00    Types: Cigarettes  . Smokeless tobacco: Never Used  Substance and Sexual Activity  . Alcohol use: No    Alcohol/week: 0.0 standard drinks    Comment: pint of wine 2-3 times a week--ocassional - quit 2012  . Drug use: No     Frequency: 1.0 times per week    Types: "Crack" cocaine    Comment: none since 02/04/14  . Sexual activity: Yes  Lifestyle  . Physical activity:    Days per week: Not on file    Minutes per session: Not on file  . Stress: Not on file  Relationships  . Social connections:    Talks on phone: Not on file    Gets together: Not on file    Attends religious service: Not on file    Active member of club or organization: Not on file    Attends meetings of clubs or organizations: Not on file    Relationship status: Not on file  . Intimate partner violence:    Fear of current or ex partner: Not on file    Emotionally abused: Not on file    Physically abused: Not on file    Forced sexual activity: Not on file  Other Topics Concern  . Not on file  Social History Narrative  Lives with mom, sisters and brothers.    Family History  Problem Relation Age of Onset  . Coronary artery disease Mother   . Diabetes Mother   . Diabetes Father   . Hypertension Father   . Coronary artery disease Father   . Heart attack Father   . Breast cancer Maternal Grandmother   . Diabetes Maternal Grandmother   . Heart attack Maternal Grandmother   . Stroke Maternal Grandmother     Past Surgical History:  Procedure Laterality Date  . ANKLE SURGERY     right  . BONE MARROW ASPIRATION    . CARDIAC CATHETERIZATION    . CARDIAC CATHETERIZATION N/A 04/23/2015   Procedure: Right Heart Cath;  Surgeon: [name], MD;  Location: [facility];  Service: Cardiovascular;  Laterality: N/A;  . CARDIOVERSION N/A 03/11/2018   Procedure: CARDIOVERSION;  Surgeon: [name], MD;  Location: [facility] ENDOSCOPY;  Service: Cardiovascular;  Laterality: N/A;  . I&D EXTREMITY  08/09/2011   Procedure: IRRIGATION AND DEBRIDEMENT EXTREMITY;  Surgeon: [name], MD;  Location: [facility];  Service: General;  Laterality: Left;  i & D Left axilla abscess  . LYMPH NODE BIOPSY N/A 04/05/2013   Procedure: EXCISIONAL BIOPSY  RIGHT SUBMANDIBULAR NODE, NASAL ENDOSCOPIC WITH BIOPSY NASAL PHARYNX;  Surgeon: [name], MD;  Location: [place] [facility];  Service: ENT;  Laterality: N/A;  . TEE WITHOUT CARDIOVERSION N/A 03/11/2018   Procedure: TRANSESOPHAGEAL ECHOCARDIOGRAM (TEE);  Surgeon: [name], MD;  Location: [facility];  Service: Cardiovascular;  Laterality: N/A;    ROS: Review of Systems Negative except as above PHYSICAL EXAM: BP (!) 138/94   Pulse (!) 101   Temp (!) 97.3 F (36.3 C) (Oral)   Resp (!) 22   Wt 168 lb (76.2 kg)   LMP 05/10/2013   SpO2 99%   BMI 27.12 kg/m   Wt Readings from Last 3 Encounters:  04/11/18 168 lb (76.2 kg)  03/25/18 164 lb 12.8 oz (74.8 kg)  03/14/18 154 lb 12.8 oz (70.2 kg)   BP 138/94 Physical Exam  General appearance - alert, well appearing, middle-aged African-American female and in no distress Mental status - normal mood, behavior, speech, dress, motor activity, and thought processes Mouth - mucous membranes moist, pharynx normal without lesions Neck - supple, no significant adenopathy Chest - clear to auscultation, no wheezes, rales or rhonchi, symmetric air entry Heart -heart rate is irregularly irregular  Extremities - peripheral pulses normal, no pedal edema, no clubbing or cyanosis  Lab Results  Component Value Date   WBC 5.2 03/11/2018   HGB 11.1 (L) 03/11/2018   HCT 37.0 03/11/2018   MCV 79.1 (L) 03/11/2018   PLT 156 03/11/2018  '   Chemistry      Component Value Date/Time   NA 137 03/13/2018 0521   NA 137 11/10/2017 0928   NA 141 03/06/2014 0759   K 3.6 03/13/2018 0521   K 4.0 03/06/2014 0759   CL 109 03/13/2018 0521   CO2 19 (L) 03/13/2018 0521   CO2 20 (L) 03/06/2014 0759   BUN 18 03/13/2018 0521   BUN 14 11/10/2017 0928   BUN 13.3 03/06/2014 0759   CREATININE 1.40 (H) 03/13/2018 0521   CREATININE 1.01 04/23/2014 1253   CREATININE 1.1 03/06/2014 0759      Component Value Date/Time   CALCIUM 9.1 03/13/2018 0521   CALCIUM 8.6  03/06/2014 0759   ALKPHOS 140 (H) 06/01/2014 1905   ALKPHOS 123 03/06/2014  0759   AST 25 06/01/2014 1905   AST 21 03/06/2014 0759   ALT 23 06/01/2014 1905   ALT 27 03/06/2014 0759   BILITOT 0.5 06/01/2014 1905   BILITOT 0.23 03/06/2014 0759     Depression screen PHQ 2/9 04/11/2018 11/10/2017 08/21/2015  Decreased Interest 3 3 1   Down, Depressed, Hopeless 3 3 1   PHQ - 2 Score 6 6 2   Altered sleeping 3 3 3   Tired, decreased energy 3 3 3   Change in appetite 3 3 3   Feeling bad or failure about yourself  3 3 2   Trouble concentrating 3 3 3   Moving slowly or fidgety/restless 2 3 1   Suicidal thoughts 3 3 1   PHQ-9 Score 26 27 18   Difficult doing work/chores - - Somewhat difficult  Some recent data might be hidden   GAD 7 : Generalized Anxiety Score 04/11/2018 11/10/2017 08/21/2015  Nervous, Anxious, on Edge 3 3 3   Control/stop worrying 3 3 3   Worry too much - different things 3 3 3   Trouble relaxing 3 3 3   Restless 3 2 3   Easily annoyed or irritable 3 3 3   Afraid - awful might happen 3 3 -  Total GAD 7 Score 21 20 -  Anxiety Difficulty - - Very difficult     ASSESSMENT AND PLAN: 1. Essential hypertension Not at goal.  DASH diet discussed and strongly encouraged.  Continue lisinopril and Cardizem.  Add clonidine. - Basic Metabolic Panel - cloNIDine (CATAPRES) 0.1 MG tablet; Take 1 tablet (0.1 mg total) by mouth 2 (two) times daily.  Dispense: 60 tablet; Refill: 3 - diltiazem (CARDIZEM CD) 180 MG 24 hr capsule; Take 1 capsule (180 mg total) by mouth daily.  Dispense: 60 capsule; Refill: 2  2. Tobacco dependence in remission Commended her on quitting and encouraged her to remain tobacco free  3. Substance abuse in remission [facility]) Commended her on quitting.  Encouraged her to get into the some treatment program to help maintain  4. Diastolic CHF, chronic ([name]) Compensated.  5. Other persistent atrial fibrillation - diltiazem (CARDIZEM CD) 180 MG 24 hr capsule; Take 1 capsule (180  mg total) by mouth daily.  Dispense: 60 capsule; Refill: 2 - rivaroxaban (XARELTO) 20 MG TABS tablet; Take 1 tablet (20 mg total) by mouth daily with supper.  Dispense: 30 tablet; Refill: 2  6. Depression, recurrent [facility]) Patient agreeable to meeting with our LCSW who can help her get into [facility].  I have refilled Celexa trazodone and bupropion. - citalopram (CELEXA) 40 MG tablet; Take 1 tablet (40 mg total) by mouth daily.  Dispense: 30 tablet; Refill: 2 - traZODone (DESYREL) 150 MG tablet; Take 1 tablet (150 mg total) by mouth at bedtime.  Dispense: 30 tablet; Refill: 2 - buPROPion (WELLBUTRIN SR) 150 MG 12 hr tablet; Take 1 tablet (150 mg total) by mouth 2 (two) times daily.  Dispense: 60 tablet; Refill: 2  7. Other specified anxiety disorders See #6 above.  8. Microcytic anemia - CBC - Iron, TIBC and Ferritin Panel     Patient was given the opportunity to ask questions.  Patient verbalized understanding of the plan and was able to repeat key elements of the plan.   Orders Placed This Encounter  Procedures  . Basic Metabolic Panel  . CBC  . Iron, TIBC and Ferritin Panel     Requested Prescriptions   Signed Prescriptions Disp Refills  . cloNIDine (CATAPRES) 0.1 MG tablet 60 tablet 3    Sig: Take 1  tablet (0.1 mg total) by mouth 2 (two) times daily.  . citalopram (CELEXA) 40 MG tablet 30 tablet 2    Sig: Take 1 tablet (40 mg total) by mouth daily.  . traZODone (DESYREL) 150 MG tablet 30 tablet 2    Sig: Take 1 tablet (150 mg total) by mouth at bedtime.  [name] diltiazem (CARDIZEM CD) 180 MG 24 hr capsule 60 capsule 2    Sig: Take 1 capsule (180 mg total) by mouth daily.  . rivaroxaban (XARELTO) 20 MG TABS tablet 30 tablet 2    Sig: Take 1 tablet (20 mg total) by mouth daily with supper.  [name] buPROPion (WELLBUTRIN SR) 150 MG 12 hr tablet 60 tablet 2    Sig: Take 1 tablet (150 mg total) by mouth 2 (two) times daily.    Return in about 1 month (around 05/12/2018).  Karle Plumber, MD, FACP

## 2018-04-11 NOTE — Patient Instructions (Signed)
Please give patient an appointment with Christa See.  Your blood pressure is not controlled.  We have added clonidine 0.1 mg twice a day.  Please check your blood pressure at least twice a week and bring in recorded readings on next visit.  The goal is 130/80 or lower.  Please limit salt in the foods as discussed.

## 2018-04-12 ENCOUNTER — Ambulatory Visit (INDEPENDENT_AMBULATORY_CARE_PROVIDER_SITE_OTHER): Payer: Self-pay | Admitting: Licensed Clinical Social Worker

## 2018-04-12 ENCOUNTER — Other Ambulatory Visit: Payer: Self-pay | Admitting: Internal Medicine

## 2018-04-12 DIAGNOSIS — Z598 Other problems related to housing and economic circumstances: Secondary | ICD-10-CM

## 2018-04-12 DIAGNOSIS — G4485 Primary stabbing headache: Secondary | ICD-10-CM

## 2018-04-12 DIAGNOSIS — F419 Anxiety disorder, unspecified: Secondary | ICD-10-CM

## 2018-04-12 DIAGNOSIS — Z599 Problem related to housing and economic circumstances, unspecified: Secondary | ICD-10-CM

## 2018-04-12 DIAGNOSIS — F331 Major depressive disorder, recurrent, moderate: Secondary | ICD-10-CM

## 2018-04-12 LAB — BASIC METABOLIC PANEL
BUN/Creatinine Ratio: 22 (ref 9–23)
BUN: 30 mg/dL — ABNORMAL HIGH (ref 6–24)
CO2: 20 mmol/L (ref 20–29)
Calcium: 9.4 mg/dL (ref 8.7–10.2)
Chloride: 104 mmol/L (ref 96–106)
Creatinine, Ser: 1.34 mg/dL — ABNORMAL HIGH (ref 0.57–1.00)
GFR calc Af Amer: 52 mL/min/{1.73_m2} — ABNORMAL LOW (ref >59)
GFR, EST NON AFRICAN AMERICAN: 45 mL/min/{1.73_m2} — AB (ref >59)
Glucose: 87 mg/dL (ref 65–99)
POTASSIUM: 4.4 mmol/L (ref 3.5–5.2)
Sodium: 138 mmol/L (ref 134–144)

## 2018-04-12 LAB — CBC
HEMOGLOBIN: 13.5 g/dL (ref 11.1–15.9)
Hematocrit: 41.4 % (ref 34.0–46.6)
MCH: 24.6 pg — AB (ref 26.6–33.0)
MCHC: 32.6 g/dL (ref 31.5–35.7)
MCV: 76 fL — ABNORMAL LOW (ref 79–97)
Platelets: 241 10*3/uL (ref 150–450)
RBC: 5.48 x10E6/uL — ABNORMAL HIGH (ref 3.77–5.28)
RDW: 16.4 % — ABNORMAL HIGH (ref 11.7–15.4)
WBC: 6.2 10*3/uL (ref 3.4–10.8)

## 2018-04-12 LAB — IRON,TIBC AND FERRITIN PANEL
Ferritin: 102 ng/mL (ref 15–150)
Iron Saturation: 22 % (ref 15–55)
Iron: 67 ug/dL (ref 27–159)
Total Iron Binding Capacity: 310 ug/dL (ref 250–450)
UIBC: 243 ug/dL (ref 131–425)

## 2018-04-12 MED ORDER — TOPIRAMATE 25 MG PO TABS: 50.0000 mg | ORAL_TABLET | Freq: Every day | ORAL | 2 refills | Status: DC

## 2018-04-12 NOTE — Progress Notes (Signed)
Integrated Behavioral Health Initial Visit  MRN: [id number] Name: Morgan Bean  Number of [facility] Clinician visits:: 1/6 Session Start time: 10:10 AM  Session End time: 10:50 AM Total time: 40 minutes  Type of Service: [place] Interpretor:No. Interpretor Name and Language: N/A   SUBJECTIVE: Morgan Bean is a 53 y.o. female accompanied by Adult Daughter Tarsha Patient was referred by Dr. Wynetta Emery for depression and anxiety. Patient reports the following symptoms/concerns: Pt has chronic medical conditions that she has difficulty managing.  Duration of problem: Ongoing, Pt has been diagnosed with Major Depressive Disorder (MDD); Severity of problem: severe  OBJECTIVE: Mood: Depressed and Affect: Appropriate Risk of harm to self or others: No plan to harm self or others Pt scored positive on phq9; however, denies current SI/HI. Protective factors were identified, safety plan discussed, and crisis intervention resources provided  LIFE CONTEXT: Family and Social: Pt receives strong support from adult daughter, Louretta Shorten. She resides with elderly mother and three siblings; however, they are a form of stress for patient.  School/Work: Pt receives food stamps. She has been denied disability twice Self-Care: Pt participates in medication management through PCP. She has hx of substance use; however, has remained sober for 1 month. Life Changes: Pt has chronic medical conditions that she has difficulty managing.    GOALS ADDRESSED: Patient will: 1. Reduce symptoms of: anxiety, depression and stress 2. Increase knowledge and/or ability of: coping skills and healthy habits  3. Demonstrate ability to: Increase healthy adjustment to current life circumstances, Increase adequate support systems for patient/family and Increase motivation to adhere to plan of care  INTERVENTIONS: Interventions utilized: Solution-Focused Strategies, Supportive  Counseling, Psychoeducation and/or Health Education and Link to [employer]  Standardized Assessments completed: GAD-7 and PHQ 2&9 with C-SSRS  ASSESSMENT: Patient currently experiencing depression and anxiety triggered by difficulty managing chronic medical conditions. She reports difficulty sleeping, low energy, fluctuating appetite, and frequent panic attacks. Pt was accompanied by adult daughter, Louretta Shorten, who provides strong support for patient. Pt scored positive on phq9; however, denies current SI/HI. Protective factors were identified, safety plan discussed, and crisis intervention resources provided   Patient may benefit from psychoeducation and psychotherapy. LCSWA educated pt on the correlation between one's physical and mental health, in addition, to how stress can negatively impact both. Therapeutic interventions were discussed to assist with the decrease and/or management of symptoms. Pt is participating in medication management. She was successful in identifying activities that she can complete to refrain from withdrawn behavior. Pt agreed to Legal Aid referral to assist with disability appeal and has plans to follow up with [facility] for [facility].   PLAN: 1. Follow up with behavioral health clinician on : Pt was encouraged to contact LCSWA if symptoms worsen or fail to improve to schedule behavioral appointments at [facility]. 2. Behavioral recommendations: LCSWA recommends that pt apply healthy coping skills discussed, comply with medication management, and utilize provided resources. Pt is encouraged to schedule follow up appointment with LCSWA 3. Referral(s): [facility]) and [employer] Resources:  Legal Aid 4. "From scale of 1-10, how likely are you to follow plan?":   Rebekah Chesterfield, LCSW 04/15/2018 1:50 PM

## 2018-04-13 ENCOUNTER — Telehealth: Payer: Self-pay | Admitting: Cardiovascular Disease

## 2018-04-13 MED ORDER — FUROSEMIDE 40 MG PO TABS: 40.0000 mg | ORAL_TABLET | Freq: Two times a day (BID) | ORAL | 3 refills | Status: DC

## 2018-04-13 NOTE — Addendum Note (Signed)
Addended by: Jacqulynn Cadet on: 04/13/2018 02:06 PM   Modules accepted: Orders

## 2018-04-13 NOTE — Addendum Note (Signed)
Addended by: Jacqulynn Cadet on: 04/13/2018 01:05 PM   Modules accepted: Orders

## 2018-04-13 NOTE — Telephone Encounter (Signed)
Call patient to verify medication dosage and pharmacy. Rx was sent to the pharmacy

## 2018-04-13 NOTE — Telephone Encounter (Signed)
New message      *STAT* If patient is at the pharmacy, call can be transferred to refill team.   1. Which medications need to be refilled? (please list name of each medication and dose if known) furosemide (LASIX) 20 MG tablet  2. Which pharmacy/location (including street and city if local pharmacy) is medication to be sent to?[facility], [place]  3. Do they need a 30 day or 90 day supply? Pt wants 90 day

## 2018-04-25 ENCOUNTER — Ambulatory Visit (INDEPENDENT_AMBULATORY_CARE_PROVIDER_SITE_OTHER): Payer: Self-pay | Admitting: Pharmacist

## 2018-04-25 ENCOUNTER — Encounter (HOSPITAL_COMMUNITY): Payer: Self-pay

## 2018-04-25 ENCOUNTER — Other Ambulatory Visit: Payer: Self-pay

## 2018-04-25 ENCOUNTER — Encounter (HOSPITAL_COMMUNITY): Payer: Self-pay | Admitting: General Practice

## 2018-04-25 ENCOUNTER — Emergency Department (HOSPITAL_COMMUNITY): Payer: Medicaid Other

## 2018-04-25 ENCOUNTER — Encounter: Payer: Self-pay | Admitting: Pharmacist

## 2018-04-25 ENCOUNTER — Inpatient Hospital Stay (HOSPITAL_COMMUNITY)
Admission: EM | Admit: 2018-04-25 | Discharge: 2018-04-26 | DRG: 309 | Disposition: A | Discharge status: Home / Self Care | Payer: Medicaid Other | Source: Ambulatory Visit | Attending: Internal Medicine | Admitting: Internal Medicine

## 2018-04-25 VITALS — BP 98/62 | HR 76 | Ht 66.0 in | Wt 169.8 lb

## 2018-04-25 DIAGNOSIS — E785 Hyperlipidemia, unspecified: Secondary | ICD-10-CM | POA: Diagnosis present

## 2018-04-25 DIAGNOSIS — I480 Paroxysmal atrial fibrillation: Secondary | ICD-10-CM | POA: Diagnosis present

## 2018-04-25 DIAGNOSIS — Z8249 Family history of ischemic heart disease and other diseases of the circulatory system: Secondary | ICD-10-CM | POA: Diagnosis not present

## 2018-04-25 DIAGNOSIS — N189 Chronic kidney disease, unspecified: Secondary | ICD-10-CM | POA: Diagnosis present

## 2018-04-25 DIAGNOSIS — Z79899 Other long term (current) drug therapy: Secondary | ICD-10-CM

## 2018-04-25 DIAGNOSIS — Z7901 Long term (current) use of anticoagulants: Secondary | ICD-10-CM

## 2018-04-25 DIAGNOSIS — I4891 Unspecified atrial fibrillation: Secondary | ICD-10-CM

## 2018-04-25 DIAGNOSIS — J449 Chronic obstructive pulmonary disease, unspecified: Secondary | ICD-10-CM | POA: Diagnosis present

## 2018-04-25 DIAGNOSIS — I130 Hypertensive heart and chronic kidney disease with heart failure and stage 1 through stage 4 chronic kidney disease, or unspecified chronic kidney disease: Secondary | ICD-10-CM | POA: Diagnosis present

## 2018-04-25 DIAGNOSIS — Z8619 Personal history of other infectious and parasitic diseases: Secondary | ICD-10-CM | POA: Diagnosis not present

## 2018-04-25 DIAGNOSIS — F339 Major depressive disorder, recurrent, unspecified: Secondary | ICD-10-CM | POA: Diagnosis present

## 2018-04-25 DIAGNOSIS — Z881 Allergy status to other antibiotic agents status: Secondary | ICD-10-CM

## 2018-04-25 DIAGNOSIS — Z8673 Personal history of transient ischemic attack (TIA), and cerebral infarction without residual deficits: Secondary | ICD-10-CM | POA: Diagnosis not present

## 2018-04-25 DIAGNOSIS — D631 Anemia in chronic kidney disease: Secondary | ICD-10-CM | POA: Diagnosis present

## 2018-04-25 DIAGNOSIS — F17210 Nicotine dependence, cigarettes, uncomplicated: Secondary | ICD-10-CM | POA: Diagnosis present

## 2018-04-25 DIAGNOSIS — I2510 Atherosclerotic heart disease of native coronary artery without angina pectoris: Secondary | ICD-10-CM | POA: Diagnosis present

## 2018-04-25 DIAGNOSIS — Z86000 Personal history of in-situ neoplasm of breast: Secondary | ICD-10-CM | POA: Diagnosis not present

## 2018-04-25 DIAGNOSIS — Z803 Family history of malignant neoplasm of breast: Secondary | ICD-10-CM

## 2018-04-25 DIAGNOSIS — I10 Essential (primary) hypertension: Secondary | ICD-10-CM

## 2018-04-25 DIAGNOSIS — G4733 Obstructive sleep apnea (adult) (pediatric): Secondary | ICD-10-CM | POA: Diagnosis present

## 2018-04-25 DIAGNOSIS — I4581 Long QT syndrome: Secondary | ICD-10-CM | POA: Diagnosis present

## 2018-04-25 DIAGNOSIS — I4892 Unspecified atrial flutter: Principal | ICD-10-CM | POA: Diagnosis present

## 2018-04-25 DIAGNOSIS — N179 Acute kidney failure, unspecified: Secondary | ICD-10-CM | POA: Diagnosis present

## 2018-04-25 DIAGNOSIS — Z823 Family history of stroke: Secondary | ICD-10-CM

## 2018-04-25 DIAGNOSIS — F419 Anxiety disorder, unspecified: Secondary | ICD-10-CM | POA: Diagnosis present

## 2018-04-25 DIAGNOSIS — I5032 Chronic diastolic (congestive) heart failure: Secondary | ICD-10-CM | POA: Diagnosis present

## 2018-04-25 DIAGNOSIS — R0902 Hypoxemia: Secondary | ICD-10-CM | POA: Diagnosis not present

## 2018-04-25 DIAGNOSIS — F1510 Other stimulant abuse, uncomplicated: Secondary | ICD-10-CM | POA: Diagnosis present

## 2018-04-25 DIAGNOSIS — Z833 Family history of diabetes mellitus: Secondary | ICD-10-CM

## 2018-04-25 DIAGNOSIS — I083 Combined rheumatic disorders of mitral, aortic and tricuspid valves: Secondary | ICD-10-CM | POA: Diagnosis present

## 2018-04-25 DIAGNOSIS — I4819 Other persistent atrial fibrillation: Secondary | ICD-10-CM | POA: Diagnosis present

## 2018-04-25 DIAGNOSIS — I272 Pulmonary hypertension, unspecified: Secondary | ICD-10-CM | POA: Diagnosis present

## 2018-04-25 DIAGNOSIS — R072 Precordial pain: Secondary | ICD-10-CM

## 2018-04-25 DIAGNOSIS — Z7951 Long term (current) use of inhaled steroids: Secondary | ICD-10-CM

## 2018-04-25 DIAGNOSIS — R079 Chest pain, unspecified: Secondary | ICD-10-CM | POA: Diagnosis present

## 2018-04-25 LAB — I-STAT BETA HCG BLOOD, ED (MC, WL, AP ONLY): I-stat hCG, quantitative: <5 m[IU]/mL (ref <5)

## 2018-04-25 LAB — BASIC METABOLIC PANEL
Anion gap: 11 (ref 5–15)
BUN: 27 mg/dL — ABNORMAL HIGH (ref 6–20)
CHLORIDE: 104 mmol/L (ref 98–111)
CO2: 22 mmol/L (ref 22–32)
Calcium: 8.9 mg/dL (ref 8.9–10.3)
Creatinine, Ser: 1.71 mg/dL — ABNORMAL HIGH (ref 0.44–1.00)
GFR calc Af Amer: 39 mL/min — ABNORMAL LOW (ref >60)
GFR calc non Af Amer: 33 mL/min — ABNORMAL LOW (ref >60)
Glucose, Bld: 92 mg/dL (ref 70–99)
Potassium: 3.8 mmol/L (ref 3.5–5.1)
Sodium: 137 mmol/L (ref 135–145)

## 2018-04-25 LAB — CBC
HCT: 39.9 % (ref 36.0–46.0)
HEMOGLOBIN: 12 g/dL (ref 12.0–15.0)
MCH: 23.9 pg — ABNORMAL LOW (ref 26.0–34.0)
MCHC: 30.1 g/dL (ref 30.0–36.0)
MCV: 79.3 fL — ABNORMAL LOW (ref 80.0–100.0)
Platelets: 195 10*3/uL (ref 150–400)
RBC: 5.03 MIL/uL (ref 3.87–5.11)
RDW: 16.4 % — ABNORMAL HIGH (ref 11.5–15.5)
WBC: 5.5 10*3/uL (ref 4.0–10.5)
nRBC: 0 % (ref 0.0–0.2)

## 2018-04-25 LAB — I-STAT TROPONIN, ED: Troponin i, poc: 0 ng/mL (ref 0.00–0.08)

## 2018-04-25 LAB — RAPID URINE DRUG SCREEN, HOSP PERFORMED
Amphetamines: POSITIVE — AB
BARBITURATES: NOT DETECTED
Benzodiazepines: NOT DETECTED
Cocaine: NOT DETECTED
Opiates: NOT DETECTED
Tetrahydrocannabinol: NOT DETECTED

## 2018-04-25 LAB — TROPONIN I: Troponin I: <0.03 ng/mL (ref <0.03)

## 2018-04-25 LAB — BRAIN NATRIURETIC PEPTIDE: B NATRIURETIC PEPTIDE 5: 133.9 pg/mL — AB (ref 0.0–100.0)

## 2018-04-25 LAB — MRSA PCR SCREENING: MRSA by PCR: NEGATIVE

## 2018-04-25 MED ORDER — FUROSEMIDE 40 MG PO TABS
40.0000 mg | ORAL_TABLET | Freq: Two times a day (BID) | ORAL | Status: DC
  Administered 2018-04-25 – 2018-04-26 (×3): 40 mg via ORAL
  Filled 2018-04-25 (×3): qty 1

## 2018-04-25 MED ORDER — SODIUM CHLORIDE 0.9% FLUSH: 3.0000 mL | Freq: Once | INTRAVENOUS | Status: DC

## 2018-04-25 MED ORDER — BUPROPION HCL ER (SR) 150 MG PO TB12
150.0000 mg | ORAL_TABLET | Freq: Two times a day (BID) | ORAL | Status: DC
  Administered 2018-04-25 – 2018-04-26 (×2): 150 mg via ORAL
  Filled 2018-04-25 (×3): qty 1

## 2018-04-25 MED ORDER — TRAZODONE HCL 50 MG PO TABS
150.0000 mg | ORAL_TABLET | Freq: Every day | ORAL | Status: DC
  Administered 2018-04-25: 150 mg via ORAL
  Filled 2018-04-25: qty 3

## 2018-04-25 MED ORDER — FAMOTIDINE 20 MG PO TABS
10.0000 mg | ORAL_TABLET | Freq: Two times a day (BID) | ORAL | Status: DC
  Administered 2018-04-25 – 2018-04-26 (×2): 10 mg via ORAL
  Filled 2018-04-25 (×2): qty 1

## 2018-04-25 MED ORDER — ALBUTEROL SULFATE (2.5 MG/3ML) 0.083% IN NEBU: 2.5000 mg | INHALATION_SOLUTION | RESPIRATORY_TRACT | Status: DC | PRN

## 2018-04-25 MED ORDER — SODIUM CHLORIDE 0.9% FLUSH: 3.0000 mL | INTRAVENOUS | Status: DC | PRN

## 2018-04-25 MED ORDER — LISINOPRIL 10 MG PO TABS
10.0000 mg | ORAL_TABLET | Freq: Every day | ORAL | Status: DC
  Administered 2018-04-26: 10 mg via ORAL
  Filled 2018-04-25: qty 1

## 2018-04-25 MED ORDER — ALBUTEROL SULFATE HFA 108 (90 BASE) MCG/ACT IN AERS: 2.0000 | INHALATION_SPRAY | Freq: Four times a day (QID) | RESPIRATORY_TRACT | Status: DC | PRN

## 2018-04-25 MED ORDER — HYDROXYZINE HCL 25 MG PO TABS: 25.0000 mg | ORAL_TABLET | Freq: Four times a day (QID) | ORAL | Status: DC | PRN

## 2018-04-25 MED ORDER — DILTIAZEM HCL ER COATED BEADS 180 MG PO CP24
180.0000 mg | ORAL_CAPSULE | Freq: Every day | ORAL | Status: DC
  Administered 2018-04-26: 180 mg via ORAL
  Filled 2018-04-25: qty 1

## 2018-04-25 MED ORDER — SODIUM CHLORIDE 0.9% FLUSH
3.0000 mL | Freq: Two times a day (BID) | INTRAVENOUS | Status: DC
  Administered 2018-04-25 – 2018-04-26 (×2): 3 mL via INTRAVENOUS

## 2018-04-25 MED ORDER — ONDANSETRON HCL 4 MG PO TABS: 4.0000 mg | ORAL_TABLET | Freq: Four times a day (QID) | ORAL | Status: DC | PRN

## 2018-04-25 MED ORDER — SODIUM CHLORIDE 0.9 % IV SOLN: 250.0000 mL | INTRAVENOUS | Status: DC | PRN

## 2018-04-25 MED ORDER — CITALOPRAM HYDROBROMIDE 20 MG PO TABS
40.0000 mg | ORAL_TABLET | Freq: Every day | ORAL | Status: DC
  Administered 2018-04-26: 40 mg via ORAL
  Filled 2018-04-25: qty 2

## 2018-04-25 MED ORDER — HYDROXYZINE PAMOATE 25 MG PO CAPS
25.0000 mg | ORAL_CAPSULE | Freq: Four times a day (QID) | ORAL | Status: DC | PRN
  Filled 2018-04-25: qty 1

## 2018-04-25 MED ORDER — ATORVASTATIN CALCIUM 40 MG PO TABS
40.0000 mg | ORAL_TABLET | Freq: Every evening | ORAL | Status: DC
  Administered 2018-04-25 – 2018-04-26 (×2): 40 mg via ORAL
  Filled 2018-04-25 (×2): qty 1

## 2018-04-25 MED ORDER — RIVAROXABAN 20 MG PO TABS
20.0000 mg | ORAL_TABLET | Freq: Every day | ORAL | Status: DC
  Administered 2018-04-25 – 2018-04-26 (×2): 20 mg via ORAL
  Filled 2018-04-25 (×2): qty 1

## 2018-04-25 MED ORDER — ONDANSETRON HCL 4 MG/2ML IJ SOLN: 4.0000 mg | Freq: Four times a day (QID) | INTRAMUSCULAR | Status: DC | PRN

## 2018-04-25 MED ORDER — MOMETASONE FURO-FORMOTEROL FUM 100-5 MCG/ACT IN AERO
2.0000 | INHALATION_SPRAY | Freq: Two times a day (BID) | RESPIRATORY_TRACT | Status: DC
  Administered 2018-04-25 – 2018-04-26 (×2): 2 via RESPIRATORY_TRACT
  Filled 2018-04-25: qty 8.8

## 2018-04-25 MED ORDER — CLONIDINE HCL 0.1 MG PO TABS
0.1000 mg | ORAL_TABLET | Freq: Two times a day (BID) | ORAL | Status: DC
  Administered 2018-04-25 – 2018-04-26 (×2): 0.1 mg via ORAL
  Filled 2018-04-25 (×2): qty 1

## 2018-04-25 MED ORDER — TOPIRAMATE 25 MG PO TABS
50.0000 mg | ORAL_TABLET | Freq: Every day | ORAL | Status: DC
  Administered 2018-04-25: 50 mg via ORAL
  Filled 2018-04-25 (×2): qty 2

## 2018-04-25 NOTE — Assessment & Plan Note (Signed)
Patient presented to clinic with elaborated breathing, complaining of persistent dizziness, shortness or breath, chest pain , and palpitations. Unable to get pulse with pulse Ox device and Osat at 72% while stbading. Manual was 70bpm very fainted and irregular. Patient findings discussed with DOD (Dr Oval Linsey). EKG was ordered and performed while in office, revealing A flutter. Lungs were cleraed )per DR Oval Linsey) during auscultation.    Due to current symptoms and BP in the high 90s, patient was instructed to go to Emergency Room today. Needs further assessment of chest pain, dizziness,and shortness of breath.

## 2018-04-25 NOTE — H&P (Addendum)
[facility]                                                                                    Patient Demographics  Morgan Bean, is a 53 y.o. female  CSN: [id number]  MRN: [id number]  DOB - February 02, 1966  Admit Date - 04/25/2018  Outpatient Primary MD for the patient is Ladell Pier, MD   With History of -  Past Medical History:  Diagnosis Date  . Anemia of other chronic disease 11/13/2013  . Anginal pain ([place])    none in past year  . Anxiety   . Asthma   . Candidiasis 06/23/2013  . Chronic diastolic CHF (congestive heart failure) ([place])   . Constipation   . Coronary artery disease    Lexiscan myoview (10/12) with significant ST depression upon Lexiscan injection but no ischemia or infarction on perfusion images. Left heart cath (10/12): 30% mid LAD, 30% ostial D1, 50% ostial D2.    [name] Cough 11/13/2013  . DCIS (ductal carcinoma in situ) of breast   . Depression   . Headache 02/22/2014  . Hx of cardiovascular stress test    Lexiscan Myoview (2/16): Breast attenuation artifact, no ischemia, EF 64%; low risk  . Hyperlipemia   . Hypertension   . Iron deficiency    hx of  . Leukocytoclastic vasculitis ([place])    Rash across lower body, occurred in 9/11, diagnosed by skin biopsy. ANA positive. Thought to be secondary to cocaine use.  . Leukopenia 03/21/2013  . Lymphadenopathy 03/21/2013  . Lymphadenopathy 01/30/2014  . Mental disorder   . Pancytopenia ([place])   . Pancytopenia, acquired ([place]) 07/16/2015  . Polysubstance abuse ([place])    Prior cocaine  . Pulmonary HTN ([place])    Echo (9/11) with EF 65%, mild LVH, mild AI, mild MR, moderate to possibly severe TR with PA systolic pressure 52 mmHg. Echo (10/12): severe LV hypertrophy, EF 60-65%, mild MR, mild AI, moderate to severe tricuspid regurgitation, PA systolic pressure 38 mmHg.  Echo 4/14: moderate LVH, EF 65%, normal wall motion, diastolic dysfunction, mild AI, mild MR  . Shortness of breath    a. PFTs 5/14:   FEV1/FVC 99% predicted; FVC 60% predicted; DLCO mildly reduced, mild restriction, air trapping.;  b.  seen by pulmo (Dr. [name]) 5/14: mainly upper airway symptoms - ACE d/c'd (sx's better)  . Stroke ([place]) 2010ish  . Syphilis 04/25/2013  . Tobacco abuse   . Tricuspid regurgitation   . Unspecified deficiency anemia 03/29/2013      Past Surgical History:  Procedure Laterality Date  . ANKLE SURGERY     right  . BONE MARROW ASPIRATION    . CARDIAC CATHETERIZATION    . CARDIAC CATHETERIZATION N/A 04/23/2015   Procedure: Right Heart Cath;  Surgeon: [name], MD;  Location: [facility];  Service: Cardiovascular;  Laterality: N/A;  . CARDIOVERSION N/A 03/11/2018   Procedure: CARDIOVERSION;  Surgeon: [name], MD;  Location: [facility] ENDOSCOPY;  Service: Cardiovascular;  Laterality: N/A;  . I&D EXTREMITY  08/09/2011   Procedure: IRRIGATION AND DEBRIDEMENT EXTREMITY;  Surgeon: [name], MD;  Location: [facility];  Service: General;  Laterality: Left;  i & D Left axilla abscess  . LYMPH NODE BIOPSY N/A 04/05/2013   Procedure: EXCISIONAL BIOPSY RIGHT SUBMANDIBULAR NODE, NASAL ENDOSCOPIC WITH BIOPSY NASAL PHARYNX;  Surgeon: [name], MD;  Location: [facility];  Service: ENT;  Laterality: N/A;  . TEE WITHOUT CARDIOVERSION N/A 03/11/2018   Procedure: TRANSESOPHAGEAL ECHOCARDIOGRAM (TEE);  Surgeon: [name], MD;  Location[facility] ENDOSCOPY;  Service: Cardiovascular;  Laterality: N/A;    in for   Chief Complaint  Patient presents with  . Shortness of Breath     HPI  Morgan Bean  is a 53 y.o. female, past medical history significant for atrial fib//flutter on Cardizem, chronic diastolic congestive heart failure history of stroke and anemia presenting with 2 days history of chest pain, shortness of breath, dizziness.  She went to Dr. Oval Linsey today where she was found to be hypoxemic in the 70s and was sent to the emergency room where her oxygenation was fair and she was found to be in  atrial flutter with a controlled rate of 70.  Patient reports associated nausea but no vomiting denies loss of consciousness.  No history of leg edema    Review of Systems    In addition to the HPI above,  No Fever-chills, No Headache, No changes with Vision or hearing, No problems swallowing food or Liquids, No Abdominal pain, No Nausea or Vommitting, Bowel movements are regular, No Blood in stool or Urine, No dysuria, No new skin rashes or bruises, No new joints pains-aches,  No recent weight gain or loss, No polyuria, polydypsia or polyphagia, No significant Mental Stressors.  A full 10 point Review of Systems was done, except as stated above, all other Review of Systems were negative.   Social History Social History   Tobacco Use  . Smoking status: Current Every Day Smoker    Packs/day: 0.00    Years: 30.00    Pack years: 0.00    Types: Cigarettes  . Smokeless tobacco: Never Used  Substance Use Topics  . Alcohol use: No    Alcohol/week: 0.0 standard drinks    Comment: pint of wine 2-3 times a week--ocassional - quit 2012     Family History Family History  Problem Relation Age of Onset  . Coronary artery disease Mother   . Diabetes Mother   . Diabetes Father   . Hypertension Father   . Coronary artery disease Father   . Heart attack Father   . Breast cancer Maternal Grandmother   . Diabetes Maternal Grandmother   . Heart attack Maternal Grandmother   . Stroke Maternal Grandmother      Prior to Admission medications   Medication Sig Start Date End Date Taking? Authorizing Provider  albuterol (PROVENTIL HFA;VENTOLIN HFA) 108 (90 Base) MCG/ACT inhaler Inhale 2 puffs into the lungs every 6 (six) hours as needed for wheezing or shortness of breath. 12/06/15  Yes Tresa Garter, MD  atorvastatin (LIPITOR) 40 MG tablet TAKE 1 TABLET BY MOUTH IN THE EVENING Patient taking differently: Take 40 mg by mouth daily at 6 PM.  02/18/15  Yes [name], MD   buPROPion [facility] SR) 150 MG 12 hr tablet Take 1 tablet (150 mg total) by mouth 2 (two) times daily. 04/11/18  Yes Ladell Pier, MD  citalopram (CELEXA) 40 MG tablet Take 1 tablet (40 mg total) by mouth daily. 04/11/18  Yes Ladell Pier, MD  cloNIDine (CATAPRES) 0.1 MG tablet Take 1 tablet (0.1  mg total) by mouth 2 (two) times daily. 04/11/18  Yes Ladell Pier, MD  diltiazem (CARDIZEM CD) 180 MG 24 hr capsule Take 1 capsule (180 mg total) by mouth daily. 04/11/18  Yes Ladell Pier, MD  furosemide (LASIX) 40 MG tablet Take 1 tablet (40 mg total) by mouth 2 (two) times daily. 04/13/18  Yes [name], MD  hydrOXYzine (VISTARIL) 25 MG capsule Take 1 capsule (25 mg total) by mouth 4 (four) times daily as needed for anxiety. 03/14/18  Yes Hosie Poisson, MD  lisinopril (PRINIVIL,ZESTRIL) 10 MG tablet Take 1 tablet (10 mg total) by mouth daily. 03/25/18 06/23/18 Yes [name], MD  mometasone-formoterol [facility]) 100-5 MCG/ACT AERO Inhale 2 puffs into the lungs 2 (two) times daily. 03/14/18  Yes Hosie Poisson, MD  ranitidine (ZANTAC) 150 MG tablet Take 1 tablet (150 mg total) by mouth 2 (two) times daily. 08/21/15  Yes Langeland, Dawn T, MD  rivaroxaban (XARELTO) 20 MG TABS tablet Take 1 tablet (20 mg total) by mouth daily with supper. 04/11/18  Yes Ladell Pier, MD  topiramate (TOPAMAX) 25 MG tablet Take 2 tablets (50 mg total) by mouth at bedtime. 04/12/18  Yes Ladell Pier, MD  traZODone (DESYREL) 150 MG tablet Take 1 tablet (150 mg total) by mouth at bedtime. 04/11/18  Yes Ladell Pier, MD    Allergies  Allergen Reactions  . Ciprofloxacin Itching    Physical Exam  Vitals  Blood pressure (!) 127/94, pulse 75, resp. rate 13, height 5\' 6"  (1.676 m), weight 76.7 kg, last menstrual period 05/10/2013, SpO2 100 %.   1. General well-developed female looks tired  2. Normal affect and insight, Not Suicidal or Homicidal, Awake Alert, Oriented X 3.  3.  No F.N deficits, grossly, patient moving all extremities.  4. Ears and Eyes appear Normal, Conjunctivae clear, PERRLA. Moist Oral Mucosa.  5. Supple Neck, No JVD, No cervical lymphadenopathy appriciated, No Carotid Bruits.  6. Symmetrical Chest wall movement, decreased breath sounds at the bases.  7.  Irregular regular, No Gallops, systolic murmur noted, No Parasternal Heave.  8. Positive Bowel Sounds, Abdomen Soft, Non tender, No organomegaly appriciated,No rebound -guarding or rigidity.  9.  No Cyanosis, Normal Skin Turgor, No Skin Rash or Bruise.  10. Good muscle tone,  joints appear normal , no effusions, Normal ROM.    Data Review  CBC Recent Labs  Lab 04/25/18 1056  WBC 5.5  HGB 12.0  HCT 39.9  PLT 195  MCV 79.3*  MCH 23.9*  MCHC 30.1  RDW 16.4*   ------------------------------------------------------------------------------------------------------------------  Chemistries  Recent Labs  Lab 04/25/18 1056  NA 137  K 3.8  CL 104  CO2 22  GLUCOSE 92  BUN 27*  CREATININE 1.71*  CALCIUM 8.9   ------------------------------------------------------------------------------------------------------------------ estimated creatinine clearance is 39.4 mL/min (A) (by C-G formula based on SCr of 1.71 mg/dL (H)). ------------------------------------------------------------------------------------------------------------------ No results for input(s): TSH, T4TOTAL, T3FREE, THYROIDAB in the last 72 hours.  Invalid input(s): FREET3   Coagulation profile No results for input(s): INR, PROTIME in the last 168 hours. ------------------------------------------------------------------------------------------------------------------- No results for input(s): DDIMER in the last 72 hours. -------------------------------------------------------------------------------------------------------------------  Cardiac Enzymes No results for input(s): CKMB, TROPONINI, MYOGLOBIN in  the last 168 hours.  Invalid input(s): CK ------------------------------------------------------------------------------------------------------------------ Invalid input(s): POCBNP   ---------------------------------------------------------------------------------------------------------------  Urinalysis    Component Value Date/Time   COLORURINE YELLOW 09/20/2013 1805   APPEARANCEUR CLEAR 09/20/2013 1805   LABSPEC 1.022 09/20/2013 1805   PHURINE 5.5 09/20/2013 1805  GLUCOSEU NEG 09/20/2013 1805   HGBUR NEG 09/20/2013 1805   BILIRUBINUR NEG 04/23/2014 1245   KETONESUR NEG 09/20/2013 1805   PROTEINUR NEG 04/23/2014 1245   PROTEINUR NEG 09/20/2013 1805   UROBILINOGEN 0.2 04/23/2014 1245   UROBILINOGEN 0.2 09/20/2013 1805   NITRITE NEG 04/23/2014 1245   NITRITE NEG 09/20/2013 1805   LEUKOCYTESUR Negative 04/23/2014 1245    ----------------------------------------------------------------------------------------------------------------   Imaging results:   Dg Chest 2 View  Result Date: 04/25/2018 CLINICAL DATA:  Shortness of breath and chest pain EXAM: CHEST - 2 VIEW COMPARISON:  Chest radiograph and chest CT March 08, 2018 FINDINGS: Lungs are clear. Heart is enlarged with pulmonary vascularity normal. No adenopathy. No bone lesions. IMPRESSION: Stable cardiomegaly. No edema or consolidation. No evident adenopathy. Electronically Signed   By: Lowella Grip III M.D.   On: 04/25/2018 11:30    My personal review of EKG: Atrial fib/flutter with 72 bpm, variable AV block  Assessment & Plan  Chest pain , dizziness Serial troponins Possible stress test in a.m. Patient is followed by Dr. Oval Linsey  A flutter/fib, controlled rate Continue with Cardizem Status post cardioversion on 02/2018 Continue with Xarelto  History of diastolic congestive heart failure Clinically compensated, continue with Lasix  History of substance abuse Check urine drug screen  History of  CVA nonhemorrhagic Continue with   statin  Anxiety/depression Continue with trazodone/Topamax/Wellbutrin  Hypertension Continue with clonidine/lisinopril  DVT Prophylaxis Xarelto  AM Labs Ordered, also please review Full Orders    Code Status   Disposition Plan: Follow  Time spent in minutes : 44 minutes  Condition GUARDED   @SIGNATURE @

## 2018-04-25 NOTE — Progress Notes (Signed)
Admitted from the ED by bed awake and  alert.

## 2018-04-25 NOTE — ED Provider Notes (Signed)
[facility] EMERGENCY DEPARTMENT Provider Note   CSN: [id number] Arrival date & time: 04/25/18  1040   History   Chief Complaint Chief Complaint  Patient presents with  . Shortness of Breath    HPI Morgan Bean is a 53 y.o. female with medical history significant for atrial fibrillation s/p ablation, CHF, CAD, pulmonary hypertension, tricuspid regurgitation, CVA, polysubstance abuse resents for evaluation of chest pain and shortness of breath.  Patient states she was seen in her atrial fibrillation clinic this morning with Dr. Oval Linsey.  Patient was noted to be in atrial flutter at that time.  Patient states she has had chest pain x3 days worse this morning.  Admits to palpitations as well as intermittent dizziness and nausea. Hypoxic in cardiology office at 72%. Does have history of COPD and uses her albuterol inhaler once daily. Is currently on Xarelto and Cardizem 180mg  PO daily. Reports she has been compliant with medications at home and has not missed any doses. Polysubstance abuse in remission, last use 3 months ago.  His fever, chills, emesis, headache, vision changes, abdominal pain, diarrhea or dysuria.  Has not taken anything for symptoms PTA.  Denies additional aggravating or alleviating factors.  Patient states "this does not feel like my normal atrial fibrillation."  Patient sent from cardiology office to ED for evaluation of chest pain.  History obtained from patient. No interpretor was used.  HPI  Past Medical History:  Diagnosis Date  . Anemia of other chronic disease 11/13/2013  . Anginal pain ([place])    none in past year  . Anxiety   . Asthma   . Candidiasis 06/23/2013  . Chronic diastolic CHF (congestive heart failure) ([place])   . Constipation   . Coronary artery disease    Lexiscan myoview (10/12) with significant ST depression upon Lexiscan injection but no ischemia or infarction on perfusion images. Left heart cath (10/12): 30% mid LAD, 30% ostial D1,  50% ostial D2.    [name] Cough 11/13/2013  . DCIS (ductal carcinoma in situ) of breast   . Depression   . Headache 02/22/2014  . Hx of cardiovascular stress test    Lexiscan Myoview (2/16): Breast attenuation artifact, no ischemia, EF 64%; low risk  . Hyperlipemia   . Hypertension   . Iron deficiency    hx of  . Leukocytoclastic vasculitis ([place])    Rash across lower body, occurred in 9/11, diagnosed by skin biopsy. ANA positive. Thought to be secondary to cocaine use.  . Leukopenia 03/21/2013  . Lymphadenopathy 03/21/2013  . Lymphadenopathy 01/30/2014  . Mental disorder   . Pancytopenia ([place])   . Pancytopenia, acquired ([place]) 07/16/2015  . Polysubstance abuse ([place])    Prior cocaine  . Pulmonary HTN ([place])    Echo (9/11) with EF 65%, mild LVH, mild AI, mild MR, moderate to possibly severe TR with PA systolic pressure 52 mmHg. Echo (10/12): severe LV hypertrophy, EF 60-65%, mild MR, mild AI, moderate to severe tricuspid regurgitation, PA systolic pressure 38 mmHg.  Echo 4/14: moderate LVH, EF 65%, normal wall motion, diastolic dysfunction, mild AI, mild MR  . Shortness of breath    a. PFTs 5/14:  FEV1/FVC 99% predicted; FVC 60% predicted; DLCO mildly reduced, mild restriction, air trapping.;  b.  seen by pulmo (Dr. [name]) 5/14: mainly upper airway symptoms - ACE d/c'd (sx's better)  . Stroke ([place]) 2010ish  . Syphilis 04/25/2013  . Tobacco abuse   . Tricuspid regurgitation   . Unspecified deficiency anemia 03/29/2013  Patient Active Problem List   Diagnosis Date Noted  . Tobacco dependence in remission 04/11/2018  . Substance abuse in remission ([place]) 04/11/2018  . Depression, recurrent ([place]) 04/11/2018  . Persistent atrial fibrillation 03/10/2018  . CHF exacerbation ([place]) 03/10/2018  . Acute pulmonary edema ([name])   . Atrial fibrillation with RVR ([place])   . Nonrheumatic mitral valve regurgitation   . Acute on chronic diastolic CHF (congestive heart failure) ([place]) 03/08/2018  . Hypertensive  urgency 03/08/2018  . Atrial tachycardia ([place]) 03/08/2018  . Acute respiratory failure with hypoxia ([place]) 03/08/2018  . Pancytopenia, acquired ([place]) 07/16/2015  . Head and neck lymphadenopathy 07/16/2015  . COPD with chronic bronchitis ([place]) 05/08/2015  . Absolute anemia 02/25/2015  . Preventive measure 12/27/2014  . 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 History of stroke 12/26/2014  . 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 Headache 02/22/2014  . Dental caries 01/09/2014  . Cough 11/13/2013  . Tobacco abuse 11/13/2013  . Swelling of gums 11/13/2013  . Fatigue 11/10/2013  . Intracranial atherosclerosis 11/10/2013  . Headache(784.0) 10/04/2013  . Hyperreflexia 10/04/2013  . TIA (transient ischemic attack) 10/04/2013  . 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 Fibroid uterus 09/14/2012  . Perimenopause 09/14/2012  . Chronic cough 08/12/2012  . History of CVA (cerebrovascular accident) 08/09/2012  . Chest pain, mid sternal 04/07/2012  . Pulmonary hypertension ([place]) 04/06/2012  . Polysubstance abuse ([place]) 04/06/2012  . History of cocaine abuse ([place]) 11/16/2011  . Major depressive disorder, recurrent episode with psychotic features. 11/15/2011    Class: Acute  . Chronic diastolic heart failure ([place]) 02/24/2011  . CAD (coronary artery disease) 02/24/2011  . Hyperlipidemia 01/06/2011  . Hypertension 12/15/2010  . Chest pain 12/15/2010  . Exertional dyspnea 12/15/2010  . Leukocytoclastic vasculitis ([place]) 03/24/2007    Past Surgical History:  Procedure Laterality Date  . ANKLE SURGERY     right  . BONE MARROW ASPIRATION    . CARDIAC CATHETERIZATION    . CARDIAC CATHETERIZATION N/A 04/23/2015   Procedure: Right Heart Cath;  Surgeon: [name], MD;  Location: [facility] CV LAB;  Service: Cardiovascular;  Laterality: N/A;  . CARDIOVERSION N/A 03/11/2018   Procedure: CARDIOVERSION;  Surgeon: [name], MD;  Location[facility] ENDOSCOPY;  Service: Cardiovascular;   Laterality: N/A;  . I&D EXTREMITY  08/09/2011   Procedure: IRRIGATION AND DEBRIDEMENT EXTREMITY;  Surgeon: [name], MD;  Location: [facility];  Service: General;  Laterality: Left;  i & D Left axilla abscess  . LYMPH NODE BIOPSY N/A 04/05/2013   Procedure: EXCISIONAL BIOPSY RIGHT SUBMANDIBULAR NODE, NASAL ENDOSCOPIC WITH BIOPSY NASAL PHARYNX;  Surgeon: [name], MD;  Location: [facility];  Service: ENT;  Laterality: N/A;  . TEE WITHOUT CARDIOVERSION N/A 03/11/2018   Procedure: TRANSESOPHAGEAL ECHOCARDIOGRAM (TEE);  Surgeon: [name], MD;  Location: [facility] ENDOSCOPY;  Service: Cardiovascular;  Laterality: N/A;     OB History    Gravida  4   Para  2   Term  2   Preterm      AB  2   Living  2     SAB      TAB  2   Ectopic      Multiple      Live Births               Home Medications    Prior to Admission medications   Medication Sig Start Date End Date Taking? Authorizing Provider  albuterol (PROVENTIL HFA;VENTOLIN HFA)  108 (90 Base) MCG/ACT inhaler Inhale 2 puffs into the lungs every 6 (six) hours as needed for wheezing or shortness of breath. 12/06/15  Yes Tresa Garter, MD  atorvastatin (LIPITOR) 40 MG tablet TAKE 1 TABLET BY MOUTH IN THE EVENING Patient taking differently: Take 40 mg by mouth daily at 6 PM.  02/18/15  Yes [name], MD  buPROPion [facility] SR) 150 MG 12 hr tablet Take 1 tablet (150 mg total) by mouth 2 (two) times daily. 04/11/18  Yes Ladell Pier, MD  citalopram (CELEXA) 40 MG tablet Take 1 tablet (40 mg total) by mouth daily. 04/11/18  Yes Ladell Pier, MD  cloNIDine (CATAPRES) 0.1 MG tablet Take 1 tablet (0.1 mg total) by mouth 2 (two) times daily. 04/11/18  Yes Ladell Pier, MD  diltiazem (CARDIZEM CD) 180 MG 24 hr capsule Take 1 capsule (180 mg total) by mouth daily. 04/11/18  Yes Ladell Pier, MD  furosemide (LASIX) 40 MG tablet Take 1 tablet (40 mg total) by mouth 2 (two) times daily. 04/13/18  Yes  [name], MD  hydrOXYzine (VISTARIL) 25 MG capsule Take 1 capsule (25 mg total) by mouth 4 (four) times daily as needed for anxiety. 03/14/18  Yes Hosie Poisson, MD  lisinopril (PRINIVIL,ZESTRIL) 10 MG tablet Take 1 tablet (10 mg total) by mouth daily. 03/25/18 06/23/18 Yes [name], MD  mometasone-formoterol [facility]) 100-5 MCG/ACT AERO Inhale 2 puffs into the lungs 2 (two) times daily. 03/14/18  Yes Hosie Poisson, MD  ranitidine (ZANTAC) 150 MG tablet Take 1 tablet (150 mg total) by mouth 2 (two) times daily. 08/21/15  Yes Langeland, Dawn T, MD  rivaroxaban (XARELTO) 20 MG TABS tablet Take 1 tablet (20 mg total) by mouth daily with supper. 04/11/18  Yes Ladell Pier, MD  topiramate (TOPAMAX) 25 MG tablet Take 2 tablets (50 mg total) by mouth at bedtime. 04/12/18  Yes Ladell Pier, MD  traZODone (DESYREL) 150 MG tablet Take 1 tablet (150 mg total) by mouth at bedtime. 04/11/18  Yes Ladell Pier, MD    Family History Family History  Problem Relation Age of Onset  . Coronary artery disease Mother   . Diabetes Mother   . Diabetes Father   . Hypertension Father   . Coronary artery disease Father   . Heart attack Father   . Breast cancer Maternal Grandmother   . Diabetes Maternal Grandmother   . Heart attack Maternal Grandmother   . Stroke Maternal Grandmother     Social History Social History   Tobacco Use  . Smoking status: Current Every Day Smoker    Packs/day: 0.00    Years: 30.00    Pack years: 0.00    Types: Cigarettes  . Smokeless tobacco: Never Used  Substance Use Topics  . Alcohol use: No    Alcohol/week: 0.0 standard drinks    Comment: pint of wine 2-3 times a week--ocassional - quit 2012  . Drug use: No    Frequency: 1.0 times per week    Types: "Crack" cocaine    Comment: none since 02/04/14     Allergies   Ciprofloxacin   Review of Systems Review of Systems  Constitutional: Negative.   HENT: Negative.   Respiratory: Positive  for chest tightness and shortness of breath. Negative for cough, choking, wheezing and stridor.   Cardiovascular: Positive for chest pain and palpitations. Negative for leg swelling.  Gastrointestinal: Negative.   Genitourinary: Negative.   Musculoskeletal: Negative.  Skin: Negative.   Neurological: Negative.   All other systems reviewed and are negative.    Physical Exam Updated Vital Signs BP (!) 127/94   Pulse 75   Resp 13   Ht 5\' 6"  (1.676 m)   Wt 76.7 kg   LMP 05/10/2013   SpO2 100%   BMI 27.28 kg/m   Physical Exam Vitals signs and nursing note reviewed.  Constitutional:      General: She is not in acute distress.    Appearance: She is well-developed. She is not ill-appearing, toxic-appearing or diaphoretic.     Comments: Sitting in bed speaking with nursing initial evaluation.  Does not appear in any acute distress.  HENT:     Head: Normocephalic and atraumatic.     Mouth/Throat:     Comments: Oropharynx clear.  Mucous membranes moist. Eyes:     Pupils: Pupils are equal, round, and reactive to light.  Neck:     Musculoskeletal: Normal range of motion.     Comments: No neck stiffness or neck rigidity.  No cervical lymphadenopathy. Cardiovascular:     Rate and Rhythm: Normal rate. Rhythm irregular.     Comments: Irregular rhythm.  2+ DP, PT pulses.  No lower extremity swelling. Pulmonary:     Effort: No respiratory distress.     Comments: Clear to auscultation bilaterally without wheeze, rhonchi or rales.  Able to speak in full sentences without difficulty.  No assessory muscle usage. Abdominal:     General: There is no distension.     Comments: Soft, Nontender without rebound or guarding.  Musculoskeletal: Normal range of motion.     Right lower leg: She exhibits no tenderness. No edema.     Left lower leg: She exhibits no tenderness. No edema.     Comments: Moves all extremities without difficulty.  No lower extremity swelling.  No tenderness, warmth or  redness to bilateral calves.  Skin:    General: Skin is warm and dry.  Neurological:     Mental Status: She is alert.     Comments: Ambulatory in department without difficulty.      ED Treatments / Results  Labs (all labs ordered are listed, but only abnormal results are displayed) Labs Reviewed  BASIC METABOLIC PANEL - Abnormal; Notable for the following components:      Result Value   BUN 27 (*)    Creatinine, Ser 1.71 (*)    GFR calc non Af Amer 33 (*)    GFR calc Af Amer 39 (*)    All other components within normal limits  CBC - Abnormal; Notable for the following components:   MCV 79.3 (*)    MCH 23.9 (*)    RDW 16.4 (*)    All other components within normal limits  BRAIN NATRIURETIC PEPTIDE - Abnormal; Notable for the following components:   B Natriuretic Peptide 133.9 (*)    All other components within normal limits  I-STAT TROPONIN, ED  I-STAT BETA HCG BLOOD, ED (MC, [facility], AP ONLY)    EKG EKG Interpretation  Date/Time:  Monday April 25 2018 10:46:10 EST Ventricular Rate:  80 PR Interval:    QRS Duration: 90 QT Interval:  414 QTC Calculation: 477 R Axis:   45 Text Interpretation:  Atrial flutter with variable A-V block Nonspecific T wave abnormality Prolonged QT Abnormal ECG Confirmed by Dene Gentry [phone]) on 04/25/2018 11:51:11 AM   Radiology Dg Chest 2 View  Result Date: 04/25/2018 CLINICAL DATA:  Shortness of breath  and chest pain EXAM: CHEST - 2 VIEW COMPARISON:  Chest radiograph and chest CT March 08, 2018 FINDINGS: Lungs are clear. Heart is enlarged with pulmonary vascularity normal. No adenopathy. No bone lesions. IMPRESSION: Stable cardiomegaly. No edema or consolidation. No evident adenopathy. Electronically Signed   By: Lowella Grip III M.D.   On: 04/25/2018 11:30    Procedures Procedures (including critical care time)  Medications Ordered in ED Medications - No data to display   Initial Impression / Assessment and Plan / ED Course   I have reviewed the triage vital signs and the nursing notes.  Pertinent labs & imaging results that were available during my care of the patient were reviewed by me and considered in my medical decision making (see chart for details).  53 year old female appears otherwise well presents for evaluation of chest pain, shortness of breath. Afebrile, non septic, non ill appearing. Sent from Cardiology office for atrial flutter, Dr. Oval Linsey. Patient is on Cardizem 180 mg p.o. once daily and Xarelto for this.  Compliant with medication.  Patient is s/p ablation for afib.  Has had chest pain, palpitations, shortness of breath and intermittent dizziness x3 days worse this morning.  Hypoxic at Cardiology office at 72%, however patient oxygen saturation 97% on room air during ED visit. Initial presentation patient was in atrial fibrillation, however on reevaluation patient was in normal sinus rhythm.   On reevaluation patient is in atrial fibrillation, again. Seems she has paroxysmal Afib at this time. Currently anticoagulated. No current chest pain however with chest pain  And hypoxia at Cardiology office this morning.  She is been getting extremely short of breath with exertion as well as substernal chest pain over the last week.  Patient states this feels different than her previous atrial fibrillation, which she was admitted to the hospital for in December.  CBC without leukocytosis, metabolic panel without electrolyte abnormality, creatinine 1.71, previous 1.34, 1.40, BNP 133.9, no lower extremity edema at this time.  Chest x-ray negative for infiltrates, cardiomegaly, pulmonary edema or pneumothorax, low suspicion for pulmonary edema causing patient's shortness of breath and chest pain at this time.  Troponin negative, hCG negative, EKG with atrial fibrillation. Low patient for dissection, pneumonia, PE causing patient symptoms at this time.  Wells criteria, PERC negative. Heart score 4. Given patient has had  paroxysmal atrial fibrillation since arrival and is not in RVR do not feel patient needs additional medication for atrial fibrillation at this time. I have discussed this with my attending Dr. Francia Greaves who is in agreement with treatment, plan and disposition of patient. Concern for cardiac etiology of patient's chest pain, will consult with hospitalist for admission.  1345: Consulted with [facility] Dr. Laren Everts who aggress for admission. Patient hemodynamically stable at this time.    Final Clinical Impressions(s) / ED Diagnoses   Final diagnoses:  Precordial pain  Atrial fibrillation, unspecified type [facility])    ED Discharge Orders    None       Sharyah Bostwick A, PA-C 04/25/18 1430    Valarie Merino, MD 04/30/18 [phone]

## 2018-04-25 NOTE — ED Notes (Signed)
Spoke to Dr. Laren Everts regarding pt's intermittent chest pain (2 out of 10); pt will now go to progressive unit per Dr. Laren Everts.

## 2018-04-25 NOTE — ED Triage Notes (Addendum)
Pt endorses CP with shob, dizziness, nausea x few days. spo2 100% in triage. Sent by pcp. Has hx of a-fib. Noted to be in atrial flutter.

## 2018-04-25 NOTE — ED Notes (Signed)
ED Provider at bedside. 

## 2018-04-25 NOTE — Progress Notes (Signed)
Patient ID: Morgan Bean                 DOB: 05/21/1965                      MRN: [id number]     HPI: Morgan Bean is a 53 y.o. female referred by Dr. Oval Linsey to HTN clinic. PMH includes heart failure with preserved Ef of 50%, pulmonary hypertension, prior CVA, CAD, a.flutter s/p ablation, COPD,  OSA not on CPAP, polysubstance abuse, depression, and anxiety. Lisinopril was added to her medication regimen during most recent office visit with Dr. Oval Linsey with f/u BMET showing stable renal function.   Patient presents today accompany by daughter. Reports increased dizziness for the past 3 days, increase palpitations, shortness of breath, headaches, worsening chest pain. Denies recent  alcohol or cocaine use.   Current HTN meds:  Diltiazem 180mg  daily Furosemide 40mg  twice daily Lisinopril 10mg  daily  Previously tried:  Amlodipine - stopped during recent hospital admission  BP goal: <130/80  Family History: patient's family history includes Breast cancer in her maternal grandmother; Coronary artery disease in her father and mother; Diabetes in her father, maternal grandmother, and mother; Heart attack in her father and maternal grandmother; Hypertension in her father; Stroke in her maternal grandmother.   Social History: current smoker, denies alcohol intake, noted recent cocaine use   Wt Readings from Last 3 Encounters:  04/25/18 169 lb (76.7 kg)  04/25/18 169 lb 12.8 oz (77 kg)  04/11/18 168 lb (76.2 kg)   BP Readings from Last 3 Encounters:  04/25/18 (!) 124/92  04/25/18 98/62  04/11/18 (!) 138/94   Pulse Readings from Last 3 Encounters:  04/25/18 76  04/25/18 76  04/11/18 (!) 101     Past Medical History:  Diagnosis Date  . Anemia of other chronic disease 11/13/2013  . Anginal pain ([place])    none in past year  . Anxiety   . Asthma   . Candidiasis 06/23/2013  . Chronic diastolic CHF (congestive heart failure) ([place])   . Constipation   . Coronary artery disease    Lexiscan myoview (10/12) with significant ST depression upon Lexiscan injection but no ischemia or infarction on perfusion images. Left heart cath (10/12): 30% mid LAD, 30% ostial D1, 50% ostial D2.    [name] Cough 11/13/2013  . DCIS (ductal carcinoma in situ) of breast   . Depression   . Headache 02/22/2014  . Hx of cardiovascular stress test    Lexiscan Myoview (2/16): Breast attenuation artifact, no ischemia, EF 64%; low risk  . Hyperlipemia   . Hypertension   . Iron deficiency    hx of  . Leukocytoclastic vasculitis ([place])    Rash across lower body, occurred in 9/11, diagnosed by skin biopsy. ANA positive. Thought to be secondary to cocaine use.  . Leukopenia 03/21/2013  . Lymphadenopathy 03/21/2013  . Lymphadenopathy 01/30/2014  . Mental disorder   . Pancytopenia ([place])   . Pancytopenia, acquired ([place]) 07/16/2015  . Polysubstance abuse ([place])    Prior cocaine  . Pulmonary HTN ([place])    Echo (9/11) with EF 65%, mild LVH, mild AI, mild MR, moderate to possibly severe TR with PA systolic pressure 52 mmHg. Echo (10/12): severe LV hypertrophy, EF 60-65%, mild MR, mild AI, moderate to severe tricuspid regurgitation, PA systolic pressure 38 mmHg.  Echo 4/14: moderate LVH, EF 65%, normal wall motion, diastolic dysfunction, mild AI, mild MR  . Shortness of breath  a. PFTs 5/14:  FEV1/FVC 99% predicted; FVC 60% predicted; DLCO mildly reduced, mild restriction, air trapping.;  b.  seen by pulmo (Dr. [name]) 5/14: mainly upper airway symptoms - ACE d/c'd (sx's better)  . Stroke ([place]) 2010ish  . Syphilis 04/25/2013  . Tobacco abuse   . Tricuspid regurgitation   . Unspecified deficiency anemia 03/29/2013    Current Outpatient Medications on File Prior to Visit  Medication Sig Dispense Refill  . albuterol (PROVENTIL HFA;VENTOLIN HFA) 108 (90 Base) MCG/ACT inhaler Inhale 2 puffs into the lungs every 6 (six) hours as needed for wheezing or shortness of breath. 54 Inhaler 3  . atorvastatin (LIPITOR) 40 MG  tablet TAKE 1 TABLET BY MOUTH IN THE EVENING (Patient taking differently: Take 40 mg by mouth daily at 6 PM. ) 30 tablet 2  . buPROPion (WELLBUTRIN SR) 150 MG 12 hr tablet Take 1 tablet (150 mg total) by mouth 2 (two) times daily. 60 tablet 2  . citalopram (CELEXA) 40 MG tablet Take 1 tablet (40 mg total) by mouth daily. 30 tablet 2  . cloNIDine (CATAPRES) 0.1 MG tablet Take 1 tablet (0.1 mg total) by mouth 2 (two) times daily. 60 tablet 3  . diltiazem (CARDIZEM CD) 180 MG 24 hr capsule Take 1 capsule (180 mg total) by mouth daily. 60 capsule 2  . furosemide (LASIX) 40 MG tablet Take 1 tablet (40 mg total) by mouth 2 (two) times daily. 90 tablet 3  . hydrOXYzine (VISTARIL) 25 MG capsule Take 1 capsule (25 mg total) by mouth 4 (four) times daily as needed for anxiety. 30 capsule 0  . lisinopril (PRINIVIL,ZESTRIL) 10 MG tablet Take 1 tablet (10 mg total) by mouth daily. 90 tablet 3  . mometasone-formoterol (DULERA) 100-5 MCG/ACT AERO Inhale 2 puffs into the lungs 2 (two) times daily. 13 g 1  . ranitidine (ZANTAC) 150 MG tablet Take 1 tablet (150 mg total) by mouth 2 (two) times daily. 60 tablet 2  . rivaroxaban (XARELTO) 20 MG TABS tablet Take 1 tablet (20 mg total) by mouth daily with supper. 30 tablet 2  . topiramate (TOPAMAX) 25 MG tablet Take 2 tablets (50 mg total) by mouth at bedtime. 60 tablet 2  . traZODone (DESYREL) 150 MG tablet Take 1 tablet (150 mg total) by mouth at bedtime. 30 tablet 2   No current facility-administered medications on file prior to visit.     Allergies  Allergen Reactions  . Ciprofloxacin Itching    Hypertension Patient presented to clinic with elaborated breathing, complaining of persistent dizziness, shortness or breath, chest pain , and palpitations. Unable to get pulse with pulse Ox device and Osat at 72% while stbading. Manual was 70bpm very fainted and irregular. Patient findings discussed with DOD (Dr Oval Linsey). EKG was ordered and performed while in office,  revealing A flutter. Lungs were cleraed )per DR Oval Linsey) during auscultation.    Due to current symptoms and BP in the high 90s, patient was instructed to go to Emergency Room today. Needs further assessment of chest pain, dizziness,and shortness of breath.  [place] Yepiz Rodriguez-Guzman PharmD, BCPS, [place] [id number] 04/25/2018 12:43 PM

## 2018-04-26 DIAGNOSIS — F1510 Other stimulant abuse, uncomplicated: Secondary | ICD-10-CM

## 2018-04-26 DIAGNOSIS — I4891 Unspecified atrial fibrillation: Secondary | ICD-10-CM

## 2018-04-26 DIAGNOSIS — R072 Precordial pain: Secondary | ICD-10-CM

## 2018-04-26 LAB — TROPONIN I: Troponin I: <0.03 ng/mL (ref <0.03)

## 2018-04-26 MED ORDER — LISINOPRIL 10 MG PO TABS: 10.0000 mg | ORAL_TABLET | Freq: Every day | ORAL | 3 refills | Status: DC

## 2018-04-26 NOTE — Discharge Instructions (Addendum)
Information on my medicine - XARELTO (Rivaroxaban)   Why was Xarelto prescribed for you? Xarelto was prescribed for you to reduce the risk of a blood clot forming that can cause a stroke if you have a medical condition called atrial fibrillation (a type of irregular heartbeat).  What do you need to know about xarelto ? Take your Xarelto ONCE DAILY at the same time every day with your evening meal. If you have difficulty swallowing the tablet whole, you may crush it and mix in applesauce just prior to taking your dose.  Take Xarelto exactly as prescribed by your doctor and DO NOT stop taking Xarelto without talking to the doctor who prescribed the medication.  Stopping without other stroke prevention medication to take the place of Xarelto may increase your risk of developing a clot that causes a stroke.  Refill your prescription before you run out.  After discharge, you should have regular check-up appointments with your healthcare provider that is prescribing your Xarelto.  In the future your dose may need to be changed if your kidney function or weight changes by a significant amount.  What do you do if you miss a dose? If you are taking Xarelto ONCE DAILY and you miss a dose, take it as soon as you remember on the same day then continue your regularly scheduled once daily regimen the next day. Do not take two doses of Xarelto at the same time or on the same day.   Important Safety Information A possible side effect of Xarelto is bleeding. You should call your healthcare provider right away if you experience any of the following: ? Bleeding from an injury or your nose that does not stop. ? Unusual colored urine (red or dark brown) or unusual colored stools (red or black). ? Unusual bruising for unknown reasons. ? A serious fall or if you hit your head (even if there is no bleeding).  Some medicines may interact with Xarelto and might increase your risk of bleeding while on  Xarelto. To help avoid this, consult your healthcare provider or pharmacist prior to using any new prescription or non-prescription medications, including herbals, vitamins, non-steroidal anti-inflammatory drugs (NSAIDs) and supplements.  This website has more information on Xarelto: [email or web address]   Atrial Flutter  Atrial flutter is a type of abnormal heart rhythm (arrhythmia). The heart has an electrical system that tells the heart how to beat. In atrial flutter, the signals move rapidly in the top chambers of the heart (the atria). This makes your heart beat very fast. Atrial flutter can come and go, or it can be permanent. If this condition is not treated it can cause serious complications, such as stroke or weakened heart muscle (cardiomyopathy). What are the causes? This condition may be caused by:  A heart condition or problem, such as: ? A heart attack. ? Heart failure. ? A heart valve problem. ? Heart surgery.  A lung problem, such as: ? A blood clot in the lungs (pulmonary embolism, or PE). ? Chronic obstructive pulmonary disease.  Poorly controlled high blood pressure (hypertension).  Overactive thyroid (hyperthyroidism).  Caffeine.  Some decongestant cold medicines.  Low levels of minerals called electrolytes in the blood.  Cocaine. What increases the risk? You are more likely to develop this condition if:  You are an elderly adult.  You are a man.  You are obese.  You have obstructive sleep apnea.  You have a family history of atrial flutter.  You have diabetes. What are the  signs or symptoms? Symptoms of this condition include:  A feeling that your heart is pounding or racing (palpitations).  Shortness of breath.  Chest pain.  Feeling light-headed.  Dizziness.  Fainting.  Low blood pressure (hypotension).  Fatigue. Sometimes there are no symptoms associated with arrhythmia. How is this diagnosed? This condition may be diagnosed  based on:  An electrocardiogram (ECG). This is a test that records the electrical signals in the heart.  Ambulatory cardiac monitoring. This is a small recording device that is connected by wires to flat, sticky disks (electrodes) that are attached to your chest.  An echocardiogram. This is a test that uses sound waves to create pictures of your heart.  A transesophageal echocardiogram (TEE). In this test, a device is placed down your esophagus. This device then uses sounds waves to create even closer pictures of your heart.  Stress test. This test records your heartbeat while you exercise and checks to see if the heart muscle is receiving adequate blood supply. How is this treated? This condition may be treated with:  Medicines to: ? Make your heart beat more slowly. ? Keep your heart in normal rhythm. ? Prevent a stroke.  Cardioversion. This uses medicines or an electrical shock to make the heart beat normally.  Ablation. This destroys the heart tissue that is causing the problem. In some cases, your health care provider will treat other underlying conditions. Follow these instructions at home: Medicines  Take over-the-counter and prescription medicines only as told by your health care provider. ? Make sure you take your medicines exactly as told by your health care provider. ? Do not miss any doses.  Do not take any new medicines without talking to your health care provider. Lifestyle  Eat heart-healthy foods. Talk with a dietitian to make an eating plan that is right for you.  Do not use any products that contain nicotine or tobacco, such as cigarettes and e-cigarettes. If you need help quitting, ask your health care provider.  Limit alcohol intake to no more than 1 drink per day for nonpregnant women and 2 drinks per day for men. One drink equals 12 oz of beer, 5 oz of wine, or 1 oz of hard liquor.  Try to reduce any stress. Stress can make your symptoms worse.  Get  screened for sleep apnea. If you have the condition, work with your health care provider to find a treatment that works for you.  Do not use drugs.  Avoid excessive caffeine. General instructions  Lose weight if your health care provider tells you to do that.  Keep all follow-up visits as told by your health care provider. This is important. Contact a health care provider if:  Your symptoms get worse.  You notice that your palpitations are increasing. Get help right away if:  You have any symptoms of a stroke. "BE FAST" is an easy way to remember the main warning signs of a stroke: ? B - Balance. Signs are dizziness, sudden trouble walking, or loss of balance. ? E - Eyes. Signs are trouble seeing or a sudden change in vision. ? F - Face. Signs are sudden weakness or numbness of the face, or the face or eyelid drooping on one side. ? A - Arms. Signs are weakness or numbness in an arm. This happens suddenly and usually on one side of the body. ? S - Speech. Signs are sudden trouble speaking, slurred speech, or trouble understanding what people say. ? T - Time. Time  to call emergency services. Write down what time symptoms started.  You have other signs of a stroke, such as: ? A sudden, severe headache with no known cause. ? Nausea or vomiting. ? Seizure.  You have additional symptoms, such as: ? Fainting. ? Shortness of breath. ? Pain or pressure in your chest. ? Suddenly feeling nauseous or suddenly vomiting. ? Increased sweating with no known cause.  These symptoms may represent a serious problem that is an emergency. Do not wait to see if the symptoms will go away. Get medical help right away. Call your local emergency services (911 in the [place] Do not drive yourself to the hospital. Summary  Atrial flutter is an abnormal heart rhythm that can give you symptoms of palpitations, shortness of breath, or fatigue.  Atrial flutter is often treated with medicines to keep your  heart in a normal rhythm and to prevent a stroke.  You should seek immediate help if you cannot catch your breath, have chest pain or pressure, or have weakness, especially on one side of your body. This information is not intended to replace advice given to you by your health care provider. Make sure you discuss any questions you have with your health care provider. Document Released: 07/26/2008 Document Revised: 11/26/2017 Document Reviewed: 12/10/2016 Elsevier Interactive Patient Education  2019 [employer].

## 2018-04-26 NOTE — Discharge Summary (Signed)
DISCHARGE SUMMARY  Morgan Bean  MR#: [id number]  DOB:30-Jan-1966  Date of Admission: 04/25/2018 Date of Discharge: 04/26/2018  Attending Physician:Morgan Chamber Hennie Duos, MD  Patient's GXQ:JJHERDE, Morgan Batman, MD  Consults: none  Disposition: DC home  Follow-up Appts: Follow-up Information    Morgan Pier, MD Follow up in 5 day(s).   Specialty:  Internal Medicine Contact information: [place] [address] [phone]        Morgan Latch, MD .   Specialty:  Cardiology Contact information: [address] 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 She presented to the [facility] office (Cardiology) where she was found to be hypoxemic in the 70s. She was sent to the ED.  Hospital Course:  Chest pain Troponin negative x3 - EKG noting flutter waves without appreciable ST changes - non-obstructive CAD on cath in 2012 - no ischemia on stress 11/2015 - no sx at present to suggest angina - pt reports her cp his a chronic issue, and currently reports it is unchanged from her baseline   Dyspnea I suspect she may have suffered an episode of tachycardia related to amphetamine abuse, which precipitated her dyspnea - she denies amphetamine use - nonetheless, her dyspnea is presently resolved and she tells me she is back to her baseline - her  sats are in the upper 90s on RA  Chronic Parox A flutter/fib Status post cardioversion on 02/2018 - continue Xarelto - rate controlled - warned pt to avoid stimulants to include illicits (methamphetamine) as well as OTC (high dose sudafed, high dose caffeine, "diet pills", etc) and connection to tachycardia precipitating dyspnea  Acute kidney injury on CKD Creatinine 1.7 at presentation - baseline 1.34 January this year - hold ACEi for next 5 days then consider resuming in outpt f/u   Diastolic congestive heart failure No volume overload on exam   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 Oval Bean   Anxiety/depression Continue trazodone/Topamax/Wellbutrin  Hypertension BP well controlled - holding ACEi for now as discussed above   Allergies as of 04/26/2018      Reactions   Ciprofloxacin Itching      Medication List    TAKE these medications   albuterol 108 (90 Base) MCG/ACT inhaler Commonly known as:  PROVENTIL HFA;VENTOLIN HFA Inhale 2 puffs into the lungs every 6 (six) hours as needed for wheezing or shortness of breath.   atorvastatin 40 MG tablet Commonly known as:  LIPITOR TAKE 1 TABLET BY MOUTH IN THE EVENING What changed:  when to take this   buPROPion 150 MG 12 hr  tablet Commonly known as:  WELLBUTRIN SR Take 1 tablet (150 mg total) by mouth 2 (two) times daily.   citalopram 40 MG tablet Commonly known as:  CELEXA Take 1 tablet (40 mg total) by mouth daily.   cloNIDine 0.1 MG tablet Commonly known as:  CATAPRES Take 1 tablet (0.1 mg total) by mouth 2 (two) times daily.   diltiazem 180 MG 24 hr capsule Commonly known as:  CARDIZEM CD Take 1  capsule (180 mg total) by mouth daily.   furosemide 40 MG tablet Commonly known as:  LASIX Take 1 tablet (40 mg total) by mouth 2 (two) times daily.   hydrOXYzine 25 MG capsule Commonly known as:  VISTARIL Take 1 capsule (25 mg total) by mouth 4 (four) times daily as needed for anxiety.   lisinopril 10 MG tablet Commonly known as:  PRINIVIL,ZESTRIL Take 1 tablet (10 mg total) by mouth daily. Start taking on:  May 02, 2018 What changed:  These instructions start on May 02, 2018. If you are unsure what to do until then, ask your doctor or other care provider.   mometasone-formoterol 100-5 MCG/ACT Aero Commonly known as:  DULERA Inhale 2 puffs into the lungs 2 (two) times daily.   ranitidine 150 MG tablet Commonly known as:  ZANTAC Take 1 tablet (150 mg total) by mouth 2 (two) times daily.   rivaroxaban 20 MG Tabs tablet Commonly known as:  XARELTO Take 1 tablet (20 mg total) by mouth daily with supper.   topiramate 25 MG tablet Commonly known as:  TOPAMAX Take 2 tablets (50 mg total) by mouth at bedtime.   traZODone 150 MG tablet Commonly known as:  DESYREL Take 1 tablet (150 mg total) by mouth at bedtime.       Day of Discharge BP (!) 118/99 (BP Location: Right Leg)   Pulse 87   Temp 98.3 F (36.8 C) (Oral)   Resp 17   Ht 5\' 6"  (1.676 m)   Wt 76.7 kg   LMP 05/10/2013   SpO2 100%   BMI 27.28 kg/m   Physical Exam: General: No acute respiratory distress Lungs: Clear to auscultation bilaterally without wheezes or crackles Cardiovascular: Regular rate without murmur gallop or rub normal S1 and S2 Abdomen: Nontender, nondistended, soft, bowel sounds positive, no rebound, no ascites, no appreciable mass Extremities: No significant cyanosis, clubbing, or edema bilateral lower extremities  Basic Metabolic Panel: Recent Labs  Lab 04/25/18 1056  NA 137  K 3.8  CL 104  CO2 22  GLUCOSE 92  BUN 27*  CREATININE 1.71*  CALCIUM 8.9    CBC: Recent Labs   Lab 04/25/18 1056  WBC 5.5  HGB 12.0  HCT 39.9  MCV 79.3*  PLT 195    Cardiac Enzymes: Recent Labs  Lab 04/25/18 1859 04/26/18 0057 04/26/18 0652  TROPONINI <0.03 <0.03 <0.03   BNP (last 3 results) Recent Labs    03/08/18 1746 04/25/18 1056  BNP 497.1* 133.9*    Recent Results (from the past 240 hour(s))  MRSA PCR Screening     Status: None   Collection Time: 04/25/18  5:16 PM  Result Value Ref Range Status   MRSA by PCR NEGATIVE NEGATIVE Final    Comment:        The GeneXpert MRSA Assay (FDA approved for NASAL specimens only), is one component of a comprehensive MRSA colonization surveillance program. It is not intended to diagnose MRSA infection nor to guide or monitor treatment for MRSA infections. Performed at  [facility], [place] [address], [place], [place] [address]      Time spent in discharge (includes decision making & examination of pt): 30 minutes  04/26/2018, 2:27 PM   Cherene Altes, MD [facility] Office  [phone] Pager [phone]  On-Call/Text Page:      [email or web address]      password [facility]

## 2018-04-26 NOTE — Progress Notes (Signed)
Trop was negative x 3. No stress test at this time. Removed PIV access x 1 and pt received discharge instructions. There is only one change medication which was Lisinopril. Stop it until 2/10. Explained patient do not take until 2/10, she understood it well. Pt took her all belongings. HS [employer]

## 2018-04-26 NOTE — Care Management (Signed)
PTA independent from home.  Pt is active with [facility] and utilizes the pharmacy at discharge.  NO CM needs determined prior to discharge

## 2018-04-27 ENCOUNTER — Telehealth: Payer: Self-pay

## 2018-04-27 NOTE — Telephone Encounter (Signed)
Transition Care Management Follow-up Telephone Call Date of discharge and from where:04/26/2018 , [facility].   Attempted to contact patient. Call placed to # [phone] and message was left requesting a call back to this CM # [phone]

## 2018-04-28 ENCOUNTER — Telehealth: Payer: Self-pay

## 2018-04-28 NOTE — Telephone Encounter (Signed)
Transition Care Management Follow-up Telephone Call Date of discharge and from where:04/26/2018 , [facility].   Attempted again to contact patient. Call placed to # [phone] and message was left requesting a call back to this CM # [phone]

## 2018-04-29 ENCOUNTER — Telehealth: Payer: Self-pay

## 2018-04-29 NOTE — Telephone Encounter (Signed)
Transition Care Management Follow-up Telephone Call  Date of discharge and from where: 04/26/2018. [facility]  The patient's phone number in Epic was incorrect. Demographics updated with correct number.   How have you been since you were released from the hospital? Stated she is " feeling a lot better,"  Any questions or concerns? Her only question was regarding her afib/aflutter and she said that she needs to ask the cardiologist. Her appointment with cardiology is not until 07/2018 but she said that she will call to schedule an appointment sooner,   Items Reviewed:  Did the pt receive and understand the discharge instructions provided? Yes she has instructions, no questions   Medications obtained and verified? She said that she has all medications except lipitor,  and did not need to review the medication list and did not have any questions about the medications. She has to pick up the lipitor at [facility]. She was able to correctly state when she is to start taking the lisinopril - 05/12/2018.   Any new allergies since your discharge?none reported  Do you have support at home? Yes, her family   Other (ie: DME, Home Health, etc) she said that she has a walker but does not need it and is requesting a cane.  She has a scale and checks weight every morning and logs weights. This morning weight - 164 lbs, yesterday - 165 lbs.  She was able to correctly confirm when she is to notify her provider about her weight gain.   She also keeps a log of BP - today 131/85  Functional Questionnaire: (I = Independent and D = Dependent) ADL's: independent   Follow up appointments reviewed:    PCP Hospital f/u appt confirmed? Appointment scheduled for 05/03/2018 @ [facility] f/u appt confirmed? Cardiology appointment 07/2018 but she will call to schedule an appointment sooner.   Are transportation arrangements needed?no ,her daughter will drive her  If their condition  worsens, is the pt aware to call  their PCP or go to the ED? yes  Was the patient provided with contact information for the PCP's office or ED? She has the number  Was the pt encouraged to call back with questions or concerns?yes

## 2018-04-29 NOTE — Telephone Encounter (Signed)
Message received from the number that is listed as patient's home and mobile number # [phone] noting that this is not the patient's number.  Call placed to patient's mother, Wynelle Link # [phone] and message was left requesting that patient call this CM # [phone].  Call placed to patient's daughter,Natarchia # [phone] and a message was left requesting that patient call this CM # [phone]

## 2018-05-03 ENCOUNTER — Encounter: Payer: Self-pay | Admitting: Internal Medicine

## 2018-05-03 ENCOUNTER — Ambulatory Visit: Payer: Medicaid Other | Attending: Internal Medicine | Admitting: Internal Medicine

## 2018-05-03 VITALS — BP 115/82 | HR 67 | Temp 97.8°F | Resp 16 | Ht 66.0 in | Wt 168.0 lb

## 2018-05-03 DIAGNOSIS — Z1211 Encounter for screening for malignant neoplasm of colon: Secondary | ICD-10-CM | POA: Diagnosis not present

## 2018-05-03 DIAGNOSIS — I110 Hypertensive heart disease with heart failure: Secondary | ICD-10-CM | POA: Diagnosis not present

## 2018-05-03 DIAGNOSIS — I2510 Atherosclerotic heart disease of native coronary artery without angina pectoris: Secondary | ICD-10-CM | POA: Diagnosis not present

## 2018-05-03 DIAGNOSIS — I5032 Chronic diastolic (congestive) heart failure: Secondary | ICD-10-CM | POA: Insufficient documentation

## 2018-05-03 DIAGNOSIS — I4892 Unspecified atrial flutter: Secondary | ICD-10-CM | POA: Diagnosis not present

## 2018-05-03 DIAGNOSIS — Z79899 Other long term (current) drug therapy: Secondary | ICD-10-CM | POA: Insufficient documentation

## 2018-05-03 DIAGNOSIS — Z09 Encounter for follow-up examination after completed treatment for conditions other than malignant neoplasm: Secondary | ICD-10-CM

## 2018-05-03 DIAGNOSIS — Z7901 Long term (current) use of anticoagulants: Secondary | ICD-10-CM | POA: Insufficient documentation

## 2018-05-03 DIAGNOSIS — I4891 Unspecified atrial fibrillation: Secondary | ICD-10-CM | POA: Diagnosis not present

## 2018-05-03 DIAGNOSIS — Z87891 Personal history of nicotine dependence: Secondary | ICD-10-CM | POA: Insufficient documentation

## 2018-05-03 DIAGNOSIS — F1911 Other psychoactive substance abuse, in remission: Secondary | ICD-10-CM | POA: Insufficient documentation

## 2018-05-03 DIAGNOSIS — R0789 Other chest pain: Secondary | ICD-10-CM | POA: Diagnosis present

## 2018-05-03 DIAGNOSIS — Z881 Allergy status to other antibiotic agents status: Secondary | ICD-10-CM | POA: Diagnosis not present

## 2018-05-03 DIAGNOSIS — N289 Disorder of kidney and ureter, unspecified: Secondary | ICD-10-CM | POA: Diagnosis not present

## 2018-05-03 DIAGNOSIS — Z8249 Family history of ischemic heart disease and other diseases of the circulatory system: Secondary | ICD-10-CM | POA: Insufficient documentation

## 2018-05-03 DIAGNOSIS — I340 Nonrheumatic mitral (valve) insufficiency: Secondary | ICD-10-CM | POA: Diagnosis not present

## 2018-05-03 DIAGNOSIS — Z8673 Personal history of transient ischemic attack (TIA), and cerebral infarction without residual deficits: Secondary | ICD-10-CM | POA: Insufficient documentation

## 2018-05-03 DIAGNOSIS — J449 Chronic obstructive pulmonary disease, unspecified: Secondary | ICD-10-CM | POA: Insufficient documentation

## 2018-05-03 DIAGNOSIS — E785 Hyperlipidemia, unspecified: Secondary | ICD-10-CM | POA: Insufficient documentation

## 2018-05-03 DIAGNOSIS — Z1239 Encounter for other screening for malignant neoplasm of breast: Secondary | ICD-10-CM

## 2018-05-03 MED ORDER — ATORVASTATIN CALCIUM 40 MG PO TABS: 40.0000 mg | ORAL_TABLET | Freq: Every day | ORAL | 6 refills | Status: DC

## 2018-05-03 NOTE — Progress Notes (Signed)
Patient ID: Morgan Bean, female    DOB: 09/07/1965  MRN: [id number]  CC: Transition of care hospital follow-up Date of admission: 04/25/2018 Date of discharge 04/26/2018 Date of phone contact by care management: 04/29/2018  Subjective: Morgan Bean is a 53 y.o. female who presents for transition of care for hospital follow-up.  Her daughter is with her. Her concerns today include:  Patient with history of diastolic CHF EF 78-29%, moderate-severe MR pulmonary hypertension, CAD with non-obstructive lesions, nonhemorrhagic CVA, HTN, COPD, tobacco dependence, A. fib/a flutter, polysubstance abuse and leukocytoclastic vasculitis  Patient hospitalized 04/25/2018 for evaluation of chest pain.  EKG did not reveal any acute changes and troponin negative x3.  Patient had also reported some dyspnea and tachycardia.  Urine drug screen was positive for amphetamines.  However, patient and more so her daughter are insisting that she has not used any drugs and that the UDS was a false positive.  She is quit more than a month ago and is closely monitored by her daughter and her mother. During hospital, she also was found to have mild renal insufficiency with creatinine increasing from 1.3-1.7.  Lisinopril was held.  Patient states she was told to restart it 5 days later so she restarted it yesterday.  She denies any chest pain, shortness of breath or palpitations at this time.  She reports compliance with her medications.  HM: Due for Pap, mammogram and colon cancer screening Patient Active Problem List   Diagnosis Date Noted  . Tobacco dependence in remission 04/11/2018  . Substance abuse in remission ([place]) 04/11/2018  . Depression, recurrent ([place]) 04/11/2018  . Persistent atrial fibrillation 03/10/2018  . CHF exacerbation ([place]) 03/10/2018  . Acute pulmonary edema ([name])   . Atrial fibrillation with RVR ([place])   . Nonrheumatic mitral valve regurgitation   . Acute on chronic diastolic CHF (congestive heart  failure) ([place]) 03/08/2018  . Hypertensive urgency 03/08/2018  . Atrial tachycardia ([place]) 03/08/2018  . Acute respiratory failure with hypoxia ([place]) 03/08/2018  . Pancytopenia, acquired ([place]) 07/16/2015  . Head and neck lymphadenopathy 07/16/2015  . COPD with chronic bronchitis ([place]) 05/08/2015  . Absolute anemia 02/25/2015  . Preventive measure 12/27/2014  . 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 History of stroke 12/26/2014  . 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 Headache 02/22/2014  . Dental caries 01/09/2014  . Cough 11/13/2013  . Tobacco abuse 11/13/2013  . Swelling of gums 11/13/2013  . Fatigue 11/10/2013  . Intracranial atherosclerosis 11/10/2013  . Headache(784.0) 10/04/2013  . Hyperreflexia 10/04/2013  . TIA (transient ischemic attack) 10/04/2013  . 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 Fibroid uterus 09/14/2012  . Perimenopause 09/14/2012  . Chronic cough 08/12/2012  . History of CVA (cerebrovascular accident) 08/09/2012  . Chest pain, mid sternal 04/07/2012  . Pulmonary hypertension ([place]) 04/06/2012  . Polysubstance abuse ([place]) 04/06/2012  . History of cocaine abuse ([place]) 11/16/2011  . Major depressive disorder, recurrent episode with psychotic features. 11/15/2011    Class: Acute  . Chronic diastolic heart failure ([place]) 02/24/2011  . CAD (coronary artery disease) 02/24/2011  . Hyperlipidemia 01/06/2011  . Hypertension 12/15/2010  . Exertional dyspnea 12/15/2010  . Leukocytoclastic vasculitis ([place]) 03/24/2007     Current Outpatient Medications on File Prior to Visit  Medication Sig Dispense Refill  . albuterol (PROVENTIL HFA;VENTOLIN HFA) 108 (90 Base) MCG/ACT inhaler Inhale 2 puffs into the lungs every 6 (six) hours as needed for wheezing or shortness of breath. 54 Inhaler 3  . buPROPion [facility])  150 MG 12 hr tablet Take 1 tablet (150 mg total) by mouth 2 (two) times daily. 60 tablet 2  . citalopram (CELEXA) 40 MG tablet Take 1 tablet (40 mg  total) by mouth daily. 30 tablet 2  . cloNIDine (CATAPRES) 0.1 MG tablet Take 1 tablet (0.1 mg total) by mouth 2 (two) times daily. 60 tablet 3  . diltiazem (CARDIZEM CD) 180 MG 24 hr capsule Take 1 capsule (180 mg total) by mouth daily. 60 capsule 2  . furosemide (LASIX) 40 MG tablet Take 1 tablet (40 mg total) by mouth 2 (two) times daily. 90 tablet 3  . hydrOXYzine (VISTARIL) 25 MG capsule Take 1 capsule (25 mg total) by mouth 4 (four) times daily as needed for anxiety. 30 capsule 0  . lisinopril (PRINIVIL,ZESTRIL) 10 MG tablet Take 1 tablet (10 mg total) by mouth daily. 90 tablet 3  . mometasone-formoterol (DULERA) 100-5 MCG/ACT AERO Inhale 2 puffs into the lungs 2 (two) times daily. 13 g 1  . ranitidine (ZANTAC) 150 MG tablet Take 1 tablet (150 mg total) by mouth 2 (two) times daily. (Patient not taking: Reported on 05/03/2018) 60 tablet 2  . rivaroxaban (XARELTO) 20 MG TABS tablet Take 1 tablet (20 mg total) by mouth daily with supper. 30 tablet 2  . topiramate (TOPAMAX) 25 MG tablet Take 2 tablets (50 mg total) by mouth at bedtime. 60 tablet 2  . traZODone (DESYREL) 150 MG tablet Take 1 tablet (150 mg total) by mouth at bedtime. 30 tablet 2   No current facility-administered medications on file prior to visit.     Allergies  Allergen Reactions  . Ciprofloxacin Itching    Social History   Socioeconomic History  . Marital status: Single    Spouse name: Not on file  . Number of children: 2  . Years of education: Not on file  . Highest education level: Not on file  Occupational History  . Occupation: unemployed  Social Needs  . Financial resource strain: Not on file  . Food insecurity:    Worry: Not on file    Inability: Not on file  . Transportation needs:    Medical: Not on file    Non-medical: Not on file  Tobacco Use  . Smoking status: Former Smoker    Packs/day: 0.00    Years: 30.00    Pack years: 0.00    Types: Cigarettes    Last attempt to quit: 01/23/2018     Years since quitting: 0.2  . Smokeless tobacco: Never Used  Substance and Sexual Activity  . Alcohol use: No    Alcohol/week: 0.0 standard drinks    Comment: pint of wine 2-3 times a week--ocassional - quit 2012  . Drug use: No    Frequency: 1.0 times per week    Types: "Crack" cocaine    Comment: none since 02/04/14  . Sexual activity: Yes  Lifestyle  . Physical activity:    Days per week: Not on file    Minutes per session: Not on file  . Stress: Not on file  Relationships  . Social connections:    Talks on phone: Not on file    Gets together: Not on file    Attends religious service: Not on file    Active member of club or organization: Not on file    Attends meetings of clubs or organizations: Not on file    Relationship status: Not on file  . Intimate partner violence:    Fear of  current or ex partner: Not on file    Emotionally abused: Not on file    Physically abused: Not on file    Forced sexual activity: Not on file  Other Topics Concern  . Not on file  Social History Narrative   Lives with mom, sisters and brothers.    Family History  Problem Relation Age of Onset  . Coronary artery disease Mother   . Diabetes Mother   . Diabetes Father   . Hypertension Father   . Coronary artery disease Father   . Heart attack Father   . Breast cancer Maternal Grandmother   . Diabetes Maternal Grandmother   . Heart attack Maternal Grandmother   . Stroke Maternal Grandmother     Past Surgical History:  Procedure Laterality Date  . ANKLE SURGERY     right  . BONE MARROW ASPIRATION    . CARDIAC CATHETERIZATION    . CARDIAC CATHETERIZATION N/A 04/23/2015   Procedure: Right Heart Cath;  Surgeon: [name], MD;  Location: [place] [facility];  Service: Cardiovascular;  Laterality: N/A;  . CARDIOVERSION N/A 03/11/2018   Procedure: CARDIOVERSION;  Surgeon: [name], MD;  Location: [facility] ENDOSCOPY;  Service: Cardiovascular;  Laterality: N/A;  . I&D EXTREMITY   08/09/2011   Procedure: IRRIGATION AND DEBRIDEMENT EXTREMITY;  Surgeon: [name], MD;  Location: [place];  Service: General;  Laterality: Left;  i & D Left axilla abscess  . LYMPH NODE BIOPSY N/A 04/05/2013   Procedure: EXCISIONAL BIOPSY RIGHT SUBMANDIBULAR NODE, NASAL ENDOSCOPIC WITH BIOPSY NASAL PHARYNX;  Surgeon: [name], MD;  Location: [place];  Service: ENT;  Laterality: N/A;  . TEE WITHOUT CARDIOVERSION N/A 03/11/2018   Procedure: TRANSESOPHAGEAL ECHOCARDIOGRAM (TEE);  Surgeon: [name], MD;  Location: [facility] ENDOSCOPY;  Service: Cardiovascular;  Laterality: N/A;    ROS: Review of Systems Negative except as stated above  PHYSICAL EXAM: BP 115/82   Pulse 67   Temp 97.8 F (36.6 C) (Oral)   Resp 16   Ht 5\' 6"  (1.676 m)   Wt 168 lb (76.2 kg)   LMP 05/10/2013   SpO2 98%   BMI 27.12 kg/m   Physical Exam  General appearance - alert, well appearing, and in no distress Mental status - normal mood, behavior, speech, dress, motor activity, and thought processes Mouth - mucous membranes moist, pharynx normal without lesions Neck - supple, no significant adenopathy Chest - clear to auscultation, no wheezes, rales or rhonchi, symmetric air entry Heart -irregularly irregular but rate control  Extremities - peripheral pulses normal, no pedal edema, no clubbing or cyanosis  CMP Latest Ref Rng & Units 04/25/2018 04/11/2018 03/13/2018  Glucose 70 - 99 mg/dL 92 87 91  BUN 6 - 20 mg/dL 27(H) 30(H) 18  Creatinine 0.44 - 1.00 mg/dL 1.71(H) 1.34(H) 1.40(H)  Sodium 135 - 145 mmol/L 137 138 137  Potassium 3.5 - 5.1 mmol/L 3.8 4.4 3.6  Chloride 98 - 111 mmol/L 104 104 109  CO2 22 - 32 mmol/L 22 20 19(L)  Calcium 8.9 - 10.3 mg/dL 8.9 9.4 9.1  Total Protein 6.0 - 8.3 g/dL - - -  Total Bilirubin 0.3 - 1.2 mg/dL - - -  Alkaline Phos 39 - 117 U/L - - -  AST 0 - 37 U/L - - -  ALT 0 - 35 U/L - - -   Lipid Panel     Component Value Date/Time   CHOL 154 08/21/2015 1212   TRIG  109  08/21/2015 1212   HDL 43 (L) 08/21/2015 1212   CHOLHDL 3.6 08/21/2015 1212   VLDL 22 08/21/2015 1212   LDLCALC 89 08/21/2015 1212   LDLDIRECT 142.1 12/25/2010 0846    CBC    Component Value Date/Time   WBC 5.5 04/25/2018 1056   RBC 5.03 04/25/2018 1056   HGB 12.0 04/25/2018 1056   HGB 13.5 04/11/2018 1532   HGB 10.0 (L) 07/16/2015 1407   HCT 39.9 04/25/2018 1056   HCT 41.4 04/11/2018 1532   HCT 31.8 (L) 07/16/2015 1407   PLT 195 04/25/2018 1056   PLT 241 04/11/2018 1532   MCV 79.3 (L) 04/25/2018 1056   MCV 76 (L) 04/11/2018 1532   MCV 78.2 (L) 07/16/2015 1407   MCH 23.9 (L) 04/25/2018 1056   MCHC 30.1 04/25/2018 1056   RDW 16.4 (H) 04/25/2018 1056   RDW 16.4 (H) 04/11/2018 1532   RDW 14.9 (H) 07/16/2015 1407   LYMPHSABS 2.4 11/10/2017 0928   LYMPHSABS 1.8 07/16/2015 1407   MONOABS 0.5 10/19/2017 1254   MONOABS 0.8 07/16/2015 1407   EOSABS 0.2 11/10/2017 0928   BASOSABS 0.1 11/10/2017 0928   BASOSABS 0.0 07/16/2015 1407    ASSESSMENT AND PLAN: 1. Hospital discharge follow-up 2. Atypical chest pain Patient to continue her current medications including lisinopril, diltiazem M, furosemide and Xarelto.  She follows with cardiology.  3. Renal insufficiency - Basic Metabolic Panel  4. Substance abuse in remission [facility]) Encourage her to remain free of street drugs  5. Colon cancer screening Discussed methods of colon cancer screening.  She does not have the orange card or cone discount.  She is agreeable to doing the fit test - Fecal occult blood, imunochemical([facility])  6. Breast cancer screening - MM Digital Screening; Future   Patient was given the opportunity to ask questions.  Patient verbalized understanding of the plan and was able to repeat key elements of the plan.   Orders Placed This Encounter  Procedures  . Fecal occult blood, imunochemical([facility])  . MM Digital Screening  . Basic Metabolic Panel     Requested Prescriptions    Signed Prescriptions Disp Refills  . atorvastatin (LIPITOR) 40 MG tablet 30 tablet 6    Sig: Take 1 tablet (40 mg total) by mouth at bedtime.    Return in about 4 weeks (around 05/31/2018) for PAP.  Karle Plumber, MD, FACP

## 2018-05-03 NOTE — Progress Notes (Signed)
Pt is needing zantac refill

## 2018-05-04 LAB — BASIC METABOLIC PANEL
BUN/Creatinine Ratio: 20 (ref 9–23)
BUN: 30 mg/dL — ABNORMAL HIGH (ref 6–24)
CO2: 18 mmol/L — ABNORMAL LOW (ref 20–29)
Calcium: 9.8 mg/dL (ref 8.7–10.2)
Chloride: 104 mmol/L (ref 96–106)
Creatinine, Ser: 1.51 mg/dL — ABNORMAL HIGH (ref 0.57–1.00)
GFR calc Af Amer: 45 mL/min/{1.73_m2} — ABNORMAL LOW (ref >59)
GFR calc non Af Amer: 39 mL/min/{1.73_m2} — ABNORMAL LOW (ref >59)
Glucose: 89 mg/dL (ref 65–99)
Potassium: 4.1 mmol/L (ref 3.5–5.2)
Sodium: 139 mmol/L (ref 134–144)

## 2018-05-05 ENCOUNTER — Telehealth: Payer: Self-pay

## 2018-05-05 NOTE — Telephone Encounter (Signed)
Contacted pt to go over lab results pt is aware and doesn't have any questions or concerns 

## 2018-05-11 ENCOUNTER — Other Ambulatory Visit: Payer: Self-pay | Admitting: Internal Medicine

## 2018-05-11 ENCOUNTER — Telehealth: Payer: Self-pay

## 2018-05-11 DIAGNOSIS — R195 Other fecal abnormalities: Secondary | ICD-10-CM

## 2018-05-11 LAB — FECAL OCCULT BLOOD, IMMUNOCHEMICAL: Fecal Occult Bld: POSITIVE — AB

## 2018-05-11 NOTE — Telephone Encounter (Signed)
Contacted pt to go over lab results pt is aware and doesn't have any questions or concerns 

## 2018-05-12 ENCOUNTER — Inpatient Hospital Stay: Payer: Self-pay | Admitting: Critical Care Medicine

## 2018-05-20 ENCOUNTER — Ambulatory Visit: Payer: Self-pay | Admitting: Cardiovascular Disease

## 2018-05-23 ENCOUNTER — Encounter: Payer: Self-pay | Admitting: Cardiovascular Disease

## 2018-05-23 ENCOUNTER — Ambulatory Visit (INDEPENDENT_AMBULATORY_CARE_PROVIDER_SITE_OTHER): Payer: Self-pay | Admitting: Cardiovascular Disease

## 2018-05-23 DIAGNOSIS — I4819 Other persistent atrial fibrillation: Secondary | ICD-10-CM

## 2018-05-23 DIAGNOSIS — I10 Essential (primary) hypertension: Secondary | ICD-10-CM

## 2018-05-23 DIAGNOSIS — Z5181 Encounter for therapeutic drug level monitoring: Secondary | ICD-10-CM

## 2018-05-23 DIAGNOSIS — I340 Nonrheumatic mitral (valve) insufficiency: Secondary | ICD-10-CM

## 2018-05-23 DIAGNOSIS — N179 Acute kidney failure, unspecified: Secondary | ICD-10-CM

## 2018-05-23 MED ORDER — AMIODARONE HCL 200 MG PO TABS: 200.0000 mg | ORAL_TABLET | Freq: Two times a day (BID) | ORAL | 5 refills | Status: DC

## 2018-05-23 NOTE — Addendum Note (Signed)
Addended by: Alvina Filbert B on: 05/23/2018 05:58 PM   Modules accepted: Orders

## 2018-05-23 NOTE — Progress Notes (Signed)
Cardiology Office Note   Date:  05/23/2018   ID:  Morgan, Bean 11/07/1965, MRN [id number]  PCP:  Morgan Pier, MD  Cardiologist:   [name], MD   No chief complaint on file.    History of Present Illness: Morgan Bean is a 53 y.o. female with chronic diastolic heart failure, mild pulmonary hypertension, prior CVA, CAD, atrial flutter s/p ablation, COPD, OSA not on CPAP, polysubstance abuse, depression, and anxiety here for follow up.  She was admitted 02/2018 with acute on chronic diastolic heart failure and atrial fibrillation/flutter with RVR.  She presented with 4 days of shortness of breath that occurred in the setting of heavy cocaine abuse.  She had not been taking her furosemide and reported that she often forgets to take her medications.  Echo that admission revealed LVEF 50 to 55% with mild aortic regurgitation and moderate to severe mitral regurgitation.  She was started on diltiazem and Xarelto.    Since her last appointment [name] was seen in the ED 04/2018.  She initially came to see our pharmacist and reported shortness of breath, dizziness and chest pain.  There was concern for hypoxia.  EKG showed atrial flutter. She was referred to the ED where cardiac enzymes were negative and EKG was stable.  She has a CXR that showed no acute process.  Urine tox was positive for amphetamines though she denied any use of illegal or legal amphetamines.  She had AKI with creatinine elevated to 1.7.  She was instructed to hold lisinopril for 5 days.  [name] reports that she has been feeling chronically tired.  She has no chest pain and her breathing has been stable.  She denies lower extremity edema, orthopnea or palpitations.  She continue to abstain from illicits.   Past Medical History:  Diagnosis Date  . Anemia of other chronic disease 11/13/2013  . Anginal pain ([place])    none in past year  . Anxiety   . Asthma   . Candidiasis 06/23/2013  . Chronic diastolic CHF  (congestive heart failure) ([place])   . Constipation   . Coronary artery disease    Lexiscan myoview (10/12) with significant ST depression upon Lexiscan injection but no ischemia or infarction on perfusion images. Left heart cath (10/12): 30% mid LAD, 30% ostial D1, 50% ostial D2.    [name] Cough 11/13/2013  . DCIS (ductal carcinoma in situ) of breast   . Depression   . Headache 02/22/2014  . Hx of cardiovascular stress test    Lexiscan Myoview (2/16): Breast attenuation artifact, no ischemia, EF 64%; low risk  . Hyperlipemia   . Hypertension   . Iron deficiency    hx of  . Leukocytoclastic vasculitis ([place])    Rash across lower body, occurred in 9/11, diagnosed by skin biopsy. ANA positive. Thought to be secondary to cocaine use.  . Leukopenia 03/21/2013  . Lymphadenopathy 03/21/2013  . Lymphadenopathy 01/30/2014  . Mental disorder   . Pancytopenia ([place])   . Pancytopenia, acquired ([place]) 07/16/2015  . Polysubstance abuse ([place])    Prior cocaine  . Pulmonary HTN ([place])    Echo (9/11) with EF 65%, mild LVH, mild AI, mild MR, moderate to possibly severe TR with PA systolic pressure 52 mmHg. Echo (10/12): severe LV hypertrophy, EF 60-65%, mild MR, mild AI, moderate to severe tricuspid regurgitation, PA systolic pressure 38 mmHg.  Echo 4/14: moderate LVH, EF 65%, normal wall motion, diastolic dysfunction, mild AI, mild MR  .  Shortness of breath    a. PFTs 5/14:  FEV1/FVC 99% predicted; FVC 60% predicted; DLCO mildly reduced, mild restriction, air trapping.;  b.  seen by pulmo (Dr. [name]) 5/14: mainly upper airway symptoms - ACE d/c'd (sx's better)  . Stroke ([place]) 2010ish  . Syphilis 04/25/2013  . Tobacco abuse   . Tricuspid regurgitation   . Unspecified deficiency anemia 03/29/2013    Past Surgical History:  Procedure Laterality Date  . ANKLE SURGERY     right  . BONE MARROW ASPIRATION    . CARDIAC CATHETERIZATION    . CARDIAC CATHETERIZATION N/A 04/23/2015   Procedure: Right Heart Cath;   Surgeon: [name], MD;  Location: [place];  Service: Cardiovascular;  Laterality: N/A;  . CARDIOVERSION N/A 03/11/2018   Procedure: CARDIOVERSION;  Surgeon: [name], MD;  Location: [facility] ENDOSCOPY;  Service: Cardiovascular;  Laterality: N/A;  . I&D EXTREMITY  08/09/2011   Procedure: IRRIGATION AND DEBRIDEMENT EXTREMITY;  Surgeon: [name], MD;  Location: [place];  Service: General;  Laterality: Left;  i & D Left axilla abscess  . LYMPH NODE BIOPSY N/A 04/05/2013   Procedure: EXCISIONAL BIOPSY RIGHT SUBMANDIBULAR NODE, NASAL ENDOSCOPIC WITH BIOPSY NASAL PHARYNX;  Surgeon: [name], MD;  Location: [place];  Service: ENT;  Laterality: N/A;  . TEE WITHOUT CARDIOVERSION N/A 03/11/2018   Procedure: TRANSESOPHAGEAL ECHOCARDIOGRAM (TEE);  Surgeon: [name], MD;  Location: [facility] ENDOSCOPY;  Service: Cardiovascular;  Laterality: N/A;     Current Outpatient Medications  Medication Sig Dispense Refill  . albuterol (PROVENTIL HFA;VENTOLIN HFA) 108 (90 Base) MCG/ACT inhaler Inhale 2 puffs into the lungs every 6 (six) hours as needed for wheezing or shortness of breath. 54 Inhaler 3  . atorvastatin (LIPITOR) 40 MG tablet Take 1 tablet (40 mg total) by mouth at bedtime. 30 tablet 6  . buPROPion (WELLBUTRIN SR) 150 MG 12 hr tablet Take 1 tablet (150 mg total) by mouth 2 (two) times daily. 60 tablet 2  . citalopram (CELEXA) 40 MG tablet Take 1 tablet (40 mg total) by mouth daily. 30 tablet 2  . cloNIDine (CATAPRES) 0.1 MG tablet Take 1 tablet (0.1 mg total) by mouth 2 (two) times daily. 60 tablet 3  . diltiazem (CARDIZEM CD) 180 MG 24 hr capsule Take 1 capsule (180 mg total) by mouth daily. 60 capsule 2  . furosemide (LASIX) 40 MG tablet Take 1 tablet (40 mg total) by mouth 2 (two) times daily. 90 tablet 3  . hydrOXYzine (VISTARIL) 25 MG capsule Take 1 capsule (25 mg total) by mouth 4 (four) times daily as needed for anxiety. 30 capsule 0  . lisinopril (PRINIVIL,ZESTRIL) 10 MG tablet  Take 1 tablet (10 mg total) by mouth daily. 90 tablet 3  . mometasone-formoterol (DULERA) 100-5 MCG/ACT AERO Inhale 2 puffs into the lungs 2 (two) times daily. 13 g 1  . rivaroxaban (XARELTO) 20 MG TABS tablet Take 1 tablet (20 mg total) by mouth daily with supper. 30 tablet 2  . topiramate (TOPAMAX) 25 MG tablet Take 2 tablets (50 mg total) by mouth at bedtime. 60 tablet 2  . traZODone (DESYREL) 150 MG tablet Take 1 tablet (150 mg total) by mouth at bedtime. 30 tablet 2  . amiodarone (PACERONE) 200 MG tablet Take 1 tablet (200 mg total) by mouth 2 (two) times daily. 60 tablet 5   No current facility-administered medications for this visit.     Allergies:   Ciprofloxacin  Social History:  The patient  reports that she quit smoking about 3 months ago. Her smoking use included cigarettes. She smoked 0.00 packs per day for 30.00 years. She has never used smokeless tobacco. She reports that she does not drink alcohol or use drugs.   Family History:  The patient's family history includes Breast cancer in her maternal grandmother; Coronary artery disease in her father and mother; Diabetes in her father, maternal grandmother, and mother; Heart attack in her father and maternal grandmother; Hypertension in her father; Stroke in her maternal grandmother.    ROS:  Please see the history of present illness.   Otherwise, review of systems are positive for none.   All other systems are reviewed and negative.    PHYSICAL EXAM: VS:  BP 124/90   Pulse 88   Ht 5\' 6"  (1.676 m)   Wt 171 lb 9.6 oz (77.8 kg)   LMP 05/10/2013   BMI 27.70 kg/m  , BMI Body mass index is 27.7 kg/m. GENERAL:  Well appearing HEENT: Pupils equal round and reactive, fundi not visualized, oral mucosa unremarkable.  S/p extraction of several teeth NECK:  No jugular venous distention, waveform within normal limits, carotid upstroke brisk and symmetric, no bruits, no thyromegaly LYMPHATICS:  No cervical adenopathy LUNGS:  Clear  to auscultation bilaterally HEART:  RRR.  PMI not displaced or sustained,S1 and S2 within normal limits, no S3, no S4, no clicks, no rubs, II/VI systolic murmur at apex ABD:  Flat, positive bowel sounds normal in frequency in pitch, no bruits, no rebound, no guarding, no midline pulsatile mass, no hepatomegaly, no splenomegaly EXT:  2 plus pulses throughout, no edema, no cyanosis no clubbing SKIN:  No rashes no nodules NEURO:  Cranial nerves II through XII grossly intact, motor grossly intact throughout PSYCH:  Cognitively intact, oriented to person place and time   EKG:  EKG is ordered today. The ekg ordered 03/25/18 demonstrates atrial flutter.  Variable ventricular response.  Ventricular rate 78 bpm 05/23/18: Atrial flutter.  Variable A-V block.  Rate 82 bpm.  Echo 03/09/18: Study Conclusions  - Left ventricle: The cavity size was normal. Wall thickness was   normal. Systolic function was normal. The estimated ejection   fraction was in the range of 50% to 55%. The study is not   technically sufficient to allow evaluation of LV diastolic   function. - Aortic valve: There was mild regurgitation. - Mitral valve: There was moderate to severe regurgitation. - Left atrium: The atrium was severely dilated. - Atrial septum: No defect or patent foramen ovale was identified. - Tricuspid valve: There was moderate regurgitation. - Pulmonary arteries: PA peak pressure: 39 mm Hg (S).  TEE 03/11/18: Study Conclusions  - Left ventricle: LVEF is depressed. - Mitral valve: MV is mildly thickened. Leaflets do not appear to   coapt well Annular distance is 39 mm There is moderate to severe   MR directed toward back of LA> - Left atrium: No evidence of thrombus in the atrial cavity or   appendage.  Impressions:  - Successful cardioversion. No cardiac source of emboli was   indentified.  Recent Labs: 03/09/2018: Magnesium 2.2; TSH 2.563 04/25/2018: B Natriuretic Peptide 133.9; Hemoglobin  12.0; Platelets 195 05/03/2018: BUN 30; Creatinine, Ser 1.51; Potassium 4.1; Sodium 139    Lipid Panel    Component Value Date/Time   CHOL 154 08/21/2015 1212   TRIG 109 08/21/2015 1212   HDL 43 (L) 08/21/2015 1212   CHOLHDL 3.6 08/21/2015  1212   VLDL 22 08/21/2015 1212   LDLCALC 89 08/21/2015 1212   LDLDIRECT 142.1 12/25/2010 0846      Wt Readings from Last 3 Encounters:  05/23/18 171 lb 9.6 oz (77.8 kg)  05/03/18 168 lb (76.2 kg)  04/25/18 169 lb (76.7 kg)      ASSESSMENT AND PLAN:  # Persistent atrial flutter:  Ms. Harrow previously underwent ablation and had a DCCV 02/2018.  She remains in atrial flutter.  She reports fatigue which may be related to her flutter.  She did not maintain sinus rhythm after DCCV 02/2018.  We will start her on amiodarone 200mg  bid and plan for DCCV in one month if she remains in atrial flutter. Continue diltiazem and Xarelto.  Check CMP and PFTs.  She had a normal TSH 02/2018.  Check EKG in 1 week give that she is on Celexa.   # Moderate to severe MR:  She is euvolemic.  Continue furosemide.  She is not a good candidate for surgical repair/replacement at this time given her drug use.  She last used cocaine 02/2018.  Recommend continued abstinence and medical management for now.  2L fluid restriction.  # Hypertension:  # Acute kidney injury: Continue lisinopril 10mg  daily.  Check CMP.  # Chronic diastolic heart failure: She is euvolemic.  Continue BP control and lasix as above.  # CAD: Non-obstructive on cath in 2012.  No ischemia on stress 11/2015.  # Hyperlipidemia:  Continue atorvastatin.  # Prior stroke: Aspirin discontinued when she started Xarelto.  # Polysubstance abuse: Congratulated her on abstaining from cocaine.  Current medicines are reviewed at length with the patient today.  The patient does not have concerns regarding medicines.  The following changes have been made:  none  Labs/ tests ordered today include:   Orders  Placed This Encounter  Procedures  . Comprehensive metabolic panel  . Pulmonary function test     Disposition:   FU with Eathel Pajak C. Oval Linsey, MD, [facility] in 6 months.  APP in 1 month.    Signed, Gertha Lichtenberg C. Oval Linsey, MD, [facility]  05/23/2018 12:46 PM    [place]

## 2018-05-23 NOTE — Patient Instructions (Addendum)
Medication Instructions:  START AMIODARONE 200 MG TWICE A DAY   If you need a refill on your cardiac medications before your next appointment, please call your pharmacy.   Lab work: CMET TODAY   If you have labs (blood work) drawn today and your tests are completely normal, you will receive your results only by: [name] MyChart Message (if you have MyChart) OR . A paper copy in the mail If you have any lab test that is abnormal or we need to change your treatment, we will call you to review the results.  Testing/Procedures: Your physician has recommended that you have a pulmonary function test. Pulmonary Function Tests are a group of tests that measure how well air moves in and out of your lungs.  Follow-Up: At [facility], you and your health needs are our priority.  As part of our continuing mission to provide you with exceptional heart care, we have created designated Provider Care Teams.  These Care Teams include your primary Cardiologist (physician) and Advanced Practice Providers (APPs -  Physician Assistants and Nurse Practitioners) who all work together to provide you with the care you need, when you need it. You will need a follow up appointment in 6 months.  Please call our office 2 months in advance to schedule this appointment.  You may see [name], MD or one of the following Advanced Practice Providers on your designated Care Team:   Kerin Ransom, PA-C Roby Lofts, [place] . Sande Rives, PA-C  Your physician recommends that you schedule a follow-up appointment in: [place] physician recommends that you schedule a follow-up appointment in: 1 WEEK FOR EKG   Any Other Special Instructions Will Be Listed Below (If Applicable).  Pulmonary Function Tests Pulmonary function tests (PFTs) are used to measure how well your lungs work, find out what is causing your lung problems, and figure out the best treatment for you. You may have PFTs:  When you have  an illness involving the lungs.  To follow changes in your lung function over time if you have a chronic lung disease.  If you are an Nature conservation officer. This checks the effects of being exposed to chemicals over a long period of time.  To check lung function before having surgery or other procedures.  To check your lungs if you smoke.  To check if prescribed medicines or treatments are helping your lungs. Your results will be compared to the expected lung function of someone with healthy lungs who is similar to you in:  Age.  Gender.  Height.  Weight.  Race or ethnicity. This is done to show how your lungs compare to normal lung function (percent predicted). This is how your health care provider knows if your lung function is normal or not. If you have had PFTs done before, your health care provider will compare your current results with past results. This shows if your lung function is better, worse, or the same as before. Tell a health care provider about:  Any allergies you have.  All medicines you are taking, including inhaler or nebulizer medicines, vitamins, herbs, eye drops, creams, and over-the-counter medicines.  Any blood disorders you have.  Any surgeries you have had, especially recent eye surgery, abdominal surgery, or chest surgery. These can make PFTs difficult or unsafe.  Any medical conditions you have, including chest pain or heart problems, tuberculosis, or respiratory infections such as pneumonia, a cold, or the flu.  Any fear of  being in closed spaces (claustrophobia). Some of your tests may be in a closed space. What are the risks? Generally, this is a safe procedure. However, problems may occur, including:  Light-headedness due to over-breathing (hyperventilation).  An asthma attack from deep breathing.  A collapsed lung. What happens before the procedure?  Take over-the-counter and prescription medicines only as told by your health care  provider. If you take inhaler or nebulizer medicines, ask your health care provider which medicines you should take on the day of your testing. Some inhaler medicines may interfere with PFTs if they are taken shortly before the tests.  Follow your health care provider's instructions on eating and drinking restrictions. This may include avoiding eating large meals and drinking alcohol before the testing.  Do not use any products that contain nicotine or tobacco, such as cigarettes and e-cigarettes. If you need help quitting, ask your health care provider.  Wear comfortable clothing that will not interfere with breathing. What happens during the procedure?   You will be given a soft nose clip to wear. This is done so all of your breaths will go through your mouth instead of your nose.  You will be given a germ-free (sterile) mouthpiece. It will be attached to a machine that measures your breathing (spirometer).  You will be asked to do various breathing maneuvers. The maneuvers will be done by breathing in (inhaling) and breathing out (exhaling). You may be asked to repeat the maneuvers several times before the testing is done.  It is important to follow the instructions exactly to get accurate results. Make sure to blow as hard and as fast as you can when you are told to do so.  You may be given a medicine that makes the small air passages in your lungs larger (bronchodilator) after testing has been done. This medicine will make it easier for you to breathe.  The tests will be repeated after the bronchodilator has taken effect.  You will be monitored carefully during the procedure for faintness, dizziness, trouble breathing, or any other problems. The procedure may vary among health care providers and hospitals. What happens after the procedure?  It is up to you to get your test results. Ask your health care provider, or the department that is doing the tests, when your results will be  ready. After you have received your test results, talk with your health care provider about treatment options, if necessary. Summary  Pulmonary function tests (PFTs) are used to measure how well your lungs work, find out what is causing your lung problems, and figure out the best treatment for you.  Wear comfortable clothing that will not interfere with breathing.  It is up to you to get your test results. After you have received them, talk with your health care provider about treatment options, if necessary. This information is not intended to replace advice given to you by your health care provider. Make sure you discuss any questions you have with your health care provider. Document Released: 10/31/2003 Document Revised: 01/30/2016 Document Reviewed: 01/30/2016 Elsevier Interactive Patient Education  2019 [employer].

## 2018-05-24 ENCOUNTER — Encounter: Payer: Self-pay | Admitting: Gastroenterology

## 2018-05-24 LAB — COMPREHENSIVE METABOLIC PANEL
ALT: 25 IU/L (ref 0–32)
AST: 19 IU/L (ref 0–40)
Albumin/Globulin Ratio: 1 — ABNORMAL LOW (ref 1.2–2.2)
Albumin: 4.3 g/dL (ref 3.8–4.9)
Alkaline Phosphatase: 117 IU/L (ref 39–117)
BUN/Creatinine Ratio: 14 (ref 9–23)
BUN: 21 mg/dL (ref 6–24)
Bilirubin Total: 0.3 mg/dL (ref 0.0–1.2)
CALCIUM: 9.4 mg/dL (ref 8.7–10.2)
CO2: 19 mmol/L — ABNORMAL LOW (ref 20–29)
Chloride: 106 mmol/L (ref 96–106)
Creatinine, Ser: 1.45 mg/dL — ABNORMAL HIGH (ref 0.57–1.00)
GFR calc Af Amer: 47 mL/min/{1.73_m2} — ABNORMAL LOW (ref >59)
GFR calc non Af Amer: 41 mL/min/{1.73_m2} — ABNORMAL LOW (ref >59)
Globulin, Total: 4.2 g/dL (ref 1.5–4.5)
Glucose: 84 mg/dL (ref 65–99)
Potassium: 4.5 mmol/L (ref 3.5–5.2)
Sodium: 140 mmol/L (ref 134–144)
Total Protein: 8.5 g/dL (ref 6.0–8.5)

## 2018-05-26 ENCOUNTER — Other Ambulatory Visit: Payer: Self-pay | Admitting: Internal Medicine

## 2018-05-26 DIAGNOSIS — Z1231 Encounter for screening mammogram for malignant neoplasm of breast: Secondary | ICD-10-CM

## 2018-05-30 ENCOUNTER — Ambulatory Visit (INDEPENDENT_AMBULATORY_CARE_PROVIDER_SITE_OTHER): Payer: Self-pay

## 2018-05-30 VITALS — BP 130/89 | HR 63 | Ht 66.0 in | Wt 174.2 lb

## 2018-05-30 DIAGNOSIS — I4892 Unspecified atrial flutter: Secondary | ICD-10-CM

## 2018-05-30 DIAGNOSIS — I4819 Other persistent atrial fibrillation: Secondary | ICD-10-CM

## 2018-05-30 NOTE — Patient Instructions (Signed)
  1.) Reason for visit: Repeat EKG  2.) Name of MD requesting visit: [name], MD  3.) H&P: "Persistent atrial flutter:  Morgan Bean previously underwent ablation and had a DCCV 02/2018.  She remains in atrial flutter.  She reports fatigue which may be related to her flutter.  She did not maintain sinus rhythm after DCCV 02/2018.  We will start her on amiodarone 200mg  bid and plan for DCCV in one month if she remains in atrial flutter. Continue diltiazem and Xarelto.  Check CMP and PFTs.  She had a normal TSH 02/2018.  Check EKG in 1 week give that she is on Celexa."  4.) ROS related to problem: No complaints.        5.) Assessment and plan per MD: Consult with DOD Shelva Majestic. Patient converted back to sinus rhythm with medication regimen. Continue medication regimen. No recommendation for cardioversion at this time.

## 2018-05-31 ENCOUNTER — Ambulatory Visit (HOSPITAL_COMMUNITY)
Admission: RE | Admit: 2018-05-31 | Discharge: 2018-05-31 | Disposition: A | Discharge status: Home / Self Care | Payer: Medicaid Other | Source: Ambulatory Visit | Attending: Cardiovascular Disease | Admitting: Cardiovascular Disease

## 2018-05-31 ENCOUNTER — Other Ambulatory Visit: Payer: Self-pay

## 2018-05-31 DIAGNOSIS — I4819 Other persistent atrial fibrillation: Secondary | ICD-10-CM | POA: Insufficient documentation

## 2018-05-31 DIAGNOSIS — Z5181 Encounter for therapeutic drug level monitoring: Secondary | ICD-10-CM | POA: Diagnosis present

## 2018-05-31 LAB — PULMONARY FUNCTION TEST
DL/VA % pred: 95 %
DL/VA: 4.03 ml/min/mmHg/L
DLCO UNC % PRED: 56 %
DLCO unc: 12.02 ml/min/mmHg
FEF 25-75 PRE: 1.9 L/s
FEF 25-75 Post: 0.9 L/sec
FEF2575-%Change-Post: -52 %
FEF2575-%Pred-Post: 37 %
FEF2575-%Pred-Pre: 80 %
FEV1-%Change-Post: -14 %
FEV1-%Pred-Post: 65 %
FEV1-%Pred-Pre: 76 %
FEV1-Post: 1.52 L
FEV1-Pre: 1.77 L
FEV1FVC-%Change-Post: 4 %
FEV1FVC-%PRED-PRE: 100 %
FEV6-%Change-Post: -17 %
FEV6-%Pred-Post: 63 %
FEV6-%Pred-Pre: 76 %
FEV6-Post: 1.79 L
FEV6-Pre: 2.18 L
FEV6FVC-%Pred-Post: 103 %
FEV6FVC-%Pred-Pre: 103 %
FVC-%Change-Post: -17 %
FVC-%Pred-Post: 61 %
FVC-%Pred-Pre: 74 %
FVC-Post: 1.79 L
FVC-Pre: 2.18 L
Post FEV1/FVC ratio: 85 %
Post FEV6/FVC ratio: 100 %
Pre FEV1/FVC ratio: 81 %
Pre FEV6/FVC Ratio: 100 %
RV % pred: 94 %
RV: 1.81 L
TLC % pred: 79 %
TLC: 4.15 L

## 2018-05-31 MED ORDER — ALBUTEROL SULFATE (2.5 MG/3ML) 0.083% IN NEBU
2.5000 mg | INHALATION_SOLUTION | Freq: Once | RESPIRATORY_TRACT | Status: AC
  Administered 2018-05-31: 2.5 mg via RESPIRATORY_TRACT

## 2018-06-07 ENCOUNTER — Telehealth (HOSPITAL_COMMUNITY): Payer: Self-pay | Admitting: *Deleted

## 2018-06-07 ENCOUNTER — Telehealth: Payer: Self-pay | Admitting: *Deleted

## 2018-06-07 DIAGNOSIS — R942 Abnormal results of pulmonary function studies: Secondary | ICD-10-CM

## 2018-06-07 DIAGNOSIS — I4892 Unspecified atrial flutter: Secondary | ICD-10-CM

## 2018-06-07 NOTE — Addendum Note (Signed)
Addended by: Alvina Filbert B on: 06/07/2018 03:20 PM   Modules accepted: Orders

## 2018-06-07 NOTE — Telephone Encounter (Signed)
Morgan Bean wants to wait for pulmonologist to determine if she should come off the amiodarone before we take her off.  We are kind of locked on what we can do for her right now.  Amiodarone would need to flush 3 month and we are on hold right now with Tikosyn anyway, also we are on hold with ablations and she is not much of a candidate anyway due to left atrium size and volume.  FYI to Alvina Filbert, LPN

## 2018-06-07 NOTE — Telephone Encounter (Signed)
error 

## 2018-06-07 NOTE — Telephone Encounter (Signed)
Advised patient, verbalized understanding  

## 2018-06-07 NOTE — Telephone Encounter (Signed)
-----   Message from [name], MD sent at 06/06/2018  4:01 PM EDT ----- Pulmonary function test show that she has some underlying lung disease.  Recommend that she be seen in the atrial fibrillation clinic to see if she has alternatives to amiodarone.

## 2018-06-07 NOTE — Telephone Encounter (Signed)
Advised patient, verbalized understanding. She would like referral to Afib as well as pulmonary. Referrals placed and advised patient to call office if she has not heard in 1 week.

## 2018-06-16 ENCOUNTER — Telehealth (HOSPITAL_COMMUNITY): Payer: Self-pay | Admitting: *Deleted

## 2018-06-16 NOTE — Telephone Encounter (Signed)
Awaiting pulmonary-possible amiodarone med change

## 2018-06-20 ENCOUNTER — Telehealth: Payer: Self-pay | Admitting: Gastroenterology

## 2018-06-20 ENCOUNTER — Telehealth: Payer: Self-pay

## 2018-06-20 NOTE — Telephone Encounter (Signed)
Webex link has been sent already.

## 2018-06-20 NOTE — Telephone Encounter (Signed)
Pt called to confirmed that she will do webex visit on 4/1.

## 2018-06-20 NOTE — Telephone Encounter (Signed)
Left message for patient to contact office to discuss upcoming appointment.   

## 2018-06-21 ENCOUNTER — Encounter: Payer: Self-pay | Admitting: Internal Medicine

## 2018-06-21 NOTE — Telephone Encounter (Signed)
Spoke with patient and she is agreeable with evisit for Thursday 06/23/2018. Received verbal consent to treat patient on virtual video visit. Advised patient that I would contact her 15 minutes prior to prepare for virtual visit workup. Evisit info sent via mychart. Patient voiced understanding.

## 2018-06-22 ENCOUNTER — Telehealth: Payer: Self-pay

## 2018-06-22 ENCOUNTER — Encounter: Payer: Self-pay | Admitting: Gastroenterology

## 2018-06-22 ENCOUNTER — Ambulatory Visit (INDEPENDENT_AMBULATORY_CARE_PROVIDER_SITE_OTHER): Payer: Self-pay | Admitting: Gastroenterology

## 2018-06-22 ENCOUNTER — Ambulatory Visit: Payer: Self-pay | Admitting: Gastroenterology

## 2018-06-22 ENCOUNTER — Inpatient Hospital Stay: Admission: RE | Admit: 2018-06-22 | Payer: Self-pay | Source: Ambulatory Visit

## 2018-06-22 ENCOUNTER — Other Ambulatory Visit: Payer: Self-pay

## 2018-06-22 DIAGNOSIS — K625 Hemorrhage of anus and rectum: Secondary | ICD-10-CM

## 2018-06-22 DIAGNOSIS — Z7901 Long term (current) use of anticoagulants: Secondary | ICD-10-CM

## 2018-06-22 DIAGNOSIS — I5033 Acute on chronic diastolic (congestive) heart failure: Secondary | ICD-10-CM

## 2018-06-22 DIAGNOSIS — I4819 Other persistent atrial fibrillation: Secondary | ICD-10-CM

## 2018-06-22 MED ORDER — LORATADINE 10 MG PO TABS: 10.0000 mg | ORAL_TABLET | Freq: Every day | ORAL | 1 refills | Status: DC | PRN

## 2018-06-22 NOTE — Telephone Encounter (Signed)
Called patient to complete evisit precall. Left messages on both of patients contact numbers.

## 2018-06-22 NOTE — Progress Notes (Signed)
This patient contacted our office requesting a physician telemedicine video consultation regarding clinical questions and/or test results.  If new patient, they were referred by Dr. Karle Plumber at [facility]  Participants on the Webex: myself and the patient  The patient consented to phone consultation and was aware that a charge will be placed through their insurance.  I was in my office and the patient was at home   Encounter time:  Total time 30 minutes, with 15 minutes spent with patient on phone/webex    [name], MD   _____________________________________________________________________________________________              [facility] Consult Note:  History: Morgan Bean 06/22/2018  Referring physician: Ladell Pier, MD  Reason for consult/chief complaint: No chief complaint on file. Heme positive stool   Subjective  HPI: This patient has a complex cardiac history described in detail on recent internal medicine and cardiology notes.  She has diastolic heart failure, LV ejection fraction 50 to 55%, but with moderate to severe mitral regurgitation based on December 2019 TEE.  She has coronary disease with nonobstructive lesions, previous nonhemorrhagic CVA, hypertension, COPD, A. fib/flutter on oral anticoagulation, and history of polysubstance abuse.  On 04/25/18 she tested positive for amphetamines, though denied use of those medicines.  She had previous toxicology tests positive for cocaine.  When she followed up with community health and wellness in February of this year, she was offered a fit test colon cancer screening because she has neither insurance nor the [employer] card.  The FIT test came back positive.  Patient reports she has had intermittent painless rectal bleeding with blood either on the stool or the paper over the last couple of months.  She denies chronic abdominal pain, loss of appetite or  weight loss. She denies dysphagia, nausea, vomiting or unexpected weight loss.  ROS:  Review of Systems  He currently does not have exertional chest pain, she has intermittent dyspnea Denies dysuria  Past Medical History: Past Medical History:  Diagnosis Date  . Anemia of other chronic disease 11/13/2013  . Anginal pain ([place])    none in past year  . Anxiety   . Asthma   . Candidiasis 06/23/2013  . Chronic diastolic CHF (congestive heart failure) ([place])   . Constipation   . Coronary artery disease    Lexiscan myoview (10/12) with significant ST depression upon Lexiscan injection but no ischemia or infarction on perfusion images. Left heart cath (10/12): 30% mid LAD, 30% ostial D1, 50% ostial D2.    [name] Cough 11/13/2013  . DCIS (ductal carcinoma in situ) of breast   . Depression   . Headache 02/22/2014  . Hx of cardiovascular stress test    Lexiscan Myoview (2/16): Breast attenuation artifact, no ischemia, EF 64%; low risk  . Hyperlipemia   . Hypertension   . Iron deficiency    hx of  . Leukocytoclastic vasculitis ([place])    Rash across lower body, occurred in 9/11, diagnosed by skin biopsy. ANA positive. Thought to be secondary to cocaine use.  . Leukopenia 03/21/2013  . Lymphadenopathy 03/21/2013  . Lymphadenopathy 01/30/2014  . Mental disorder   . Pancytopenia ([place])   . Pancytopenia, acquired ([place]) 07/16/2015  . Polysubstance abuse ([place])    Prior cocaine  . Pulmonary HTN ([place])    Echo (9/11) with EF 65%, mild LVH, mild AI, mild MR, moderate to possibly severe TR with PA systolic pressure 52 mmHg. Echo (10/12):  severe LV hypertrophy, EF 60-65%, mild MR, mild AI, moderate to severe tricuspid regurgitation, PA systolic pressure 38 mmHg.  Echo 4/14: moderate LVH, EF 65%, normal wall motion, diastolic dysfunction, mild AI, mild MR  . Shortness of breath    a. PFTs 5/14:  FEV1/FVC 99% predicted; FVC 60% predicted; DLCO mildly reduced, mild restriction, air trapping.;  b.  seen by pulmo  (Dr. [name]) 5/14: mainly upper airway symptoms - ACE d/c'd (sx's better)  . Stroke ([place]) 2010ish  . Syphilis 04/25/2013  . Tobacco abuse   . Tricuspid regurgitation   . Unspecified deficiency anemia 03/29/2013     Past Surgical History: Past Surgical History:  Procedure Laterality Date  . ANKLE SURGERY     right  . BONE MARROW ASPIRATION    . CARDIAC CATHETERIZATION    . CARDIAC CATHETERIZATION N/A 04/23/2015   Procedure: Right Heart Cath;  Surgeon: [name], MD;  Location: [facility];  Service: Cardiovascular;  Laterality: N/A;  . CARDIOVERSION N/A 03/11/2018   Procedure: CARDIOVERSION;  Surgeon: [name], MD;  Location: [facility] ENDOSCOPY;  Service: Cardiovascular;  Laterality: N/A;  . I&D EXTREMITY  08/09/2011   Procedure: IRRIGATION AND DEBRIDEMENT EXTREMITY;  Surgeon: [name], MD;  Location: [facility];  Service: General;  Laterality: Left;  i & D Left axilla abscess  . LYMPH NODE BIOPSY N/A 04/05/2013   Procedure: EXCISIONAL BIOPSY RIGHT SUBMANDIBULAR NODE, NASAL ENDOSCOPIC WITH BIOPSY NASAL PHARYNX;  Surgeon: [name], MD;  Location: [facility];  Service: ENT;  Laterality: N/A;  . TEE WITHOUT CARDIOVERSION N/A 03/11/2018   Procedure: TRANSESOPHAGEAL ECHOCARDIOGRAM (TEE);  Surgeon: [name], MD;  Location: [facility] ENDOSCOPY;  Service: Cardiovascular;  Laterality: N/A;     Family History: Family History  Problem Relation Age of Onset  . Coronary artery disease Mother   . Diabetes Mother   . Diabetes Father   . Hypertension Father   . Coronary artery disease Father   . Heart attack Father   . Breast cancer Maternal Grandmother   . Diabetes Maternal Grandmother   . Heart attack Maternal Grandmother   . Stroke Maternal Grandmother     Social History: Social History   Socioeconomic History  . Marital status: Single    Spouse name: Not on file  . Number of children: 2  . Years of education: Not on file  . Highest education level: Not on file   Occupational History  . Occupation: unemployed  Social Needs  . Financial resource strain: Not on file  . Food insecurity:    Worry: Not on file    Inability: Not on file  . Transportation needs:    Medical: Not on file    Non-medical: Not on file  Tobacco Use  . Smoking status: Former Smoker    Packs/day: 0.00    Years: 30.00    Pack years: 0.00    Types: Cigarettes    Last attempt to quit: 01/23/2018    Years since quitting: 0.4  . Smokeless tobacco: Never Used  Substance and Sexual Activity  . Alcohol use: No    Alcohol/week: 0.0 standard drinks    Comment: pint of wine 2-3 times a week--ocassional - quit 2012  . Drug use: No    Frequency: 1.0 times per week    Types: "Crack" cocaine    Comment: none since 02/04/14  . Sexual activity: Yes  Lifestyle  . Physical activity:    Days per week: Not on  file    Minutes per session: Not on file  . Stress: Not on file  Relationships  . Social connections:    Talks on phone: Not on file    Gets together: Not on file    Attends religious service: Not on file    Active member of club or organization: Not on file    Attends meetings of clubs or organizations: Not on file    Relationship status: Not on file  Other Topics Concern  . Not on file  Social History Narrative   Lives with mom, sisters and brothers.    Allergies: Allergies  Allergen Reactions  . Ciprofloxacin Itching    Outpatient Meds: Current Outpatient Medications  Medication Sig Dispense Refill  . albuterol (PROVENTIL HFA;VENTOLIN HFA) 108 (90 Base) MCG/ACT inhaler Inhale 2 puffs into the lungs every 6 (six) hours as needed for wheezing or shortness of breath. 54 Inhaler 3  . amiodarone (PACERONE) 200 MG tablet Take 1 tablet (200 mg total) by mouth 2 (two) times daily. 60 tablet 5  . atorvastatin (LIPITOR) 40 MG tablet Take 1 tablet (40 mg total) by mouth at bedtime. 30 tablet 6  . buPROPion (WELLBUTRIN SR) 150 MG 12 hr tablet Take 1 tablet (150 mg total)  by mouth 2 (two) times daily. 60 tablet 2  . citalopram (CELEXA) 40 MG tablet Take 1 tablet (40 mg total) by mouth daily. 30 tablet 2  . cloNIDine (CATAPRES) 0.1 MG tablet Take 1 tablet (0.1 mg total) by mouth 2 (two) times daily. 60 tablet 3  . diltiazem (CARDIZEM CD) 180 MG 24 hr capsule Take 1 capsule (180 mg total) by mouth daily. 60 capsule 2  . furosemide (LASIX) 40 MG tablet Take 1 tablet (40 mg total) by mouth 2 (two) times daily. 90 tablet 3  . hydrOXYzine (VISTARIL) 25 MG capsule Take 1 capsule (25 mg total) by mouth 4 (four) times daily as needed for anxiety. 30 capsule 0  . lisinopril (PRINIVIL,ZESTRIL) 10 MG tablet Take 1 tablet (10 mg total) by mouth daily. 90 tablet 3  . mometasone-formoterol (DULERA) 100-5 MCG/ACT AERO Inhale 2 puffs into the lungs 2 (two) times daily. 13 g 1  . rivaroxaban (XARELTO) 20 MG TABS tablet Take 1 tablet (20 mg total) by mouth daily with supper. 30 tablet 2  . topiramate (TOPAMAX) 25 MG tablet Take 2 tablets (50 mg total) by mouth at bedtime. 60 tablet 2  . traZODone (DESYREL) 150 MG tablet Take 1 tablet (150 mg total) by mouth at bedtime. 30 tablet 2   No current facility-administered medications for this visit.       ___________________________________________________________________  She is in no acute distress on the WebEx visit.  She is breathing comfortably on room air, mental status, pleasant and conversational.  Seems to have poor dentition  Labs:  CBC Latest Ref Rng & Units 04/25/2018 04/11/2018 03/11/2018  WBC 4.0 - 10.5 K/uL 5.5 6.2 5.2  Hemoglobin 12.0 - 15.0 g/dL 12.0 13.5 11.1(L)  Hematocrit 36.0 - 46.0 % 39.9 41.4 37.0  Platelets 150 - 400 K/uL 195 241 156   CMP Latest Ref Rng & Units 05/23/2018 05/03/2018 04/25/2018  Glucose 65 - 99 mg/dL 84 89 92  BUN 6 - 24 mg/dL 21 30(H) 27(H)  Creatinine 0.57 - 1.00 mg/dL 1.45(H) 1.51(H) 1.71(H)  Sodium 134 - 144 mmol/L 140 139 137  Potassium 3.5 - 5.2 mmol/L 4.5 4.1 3.8  Chloride 96 - 106  mmol/L 106 104 104  CO2  20 - 29 mmol/L 19(L) 18(L) 22  Calcium 8.7 - 10.2 mg/dL 9.4 9.8 8.9  Total Protein 6.0 - 8.5 g/dL 8.5 - -  Total Bilirubin 0.0 - 1.2 mg/dL 0.3 - -  Alkaline Phos 39 - 117 IU/L 117 - -  AST 0 - 40 IU/L 19 - -  ALT 0 - 32 IU/L 25 - -   Low MCV, but normal hemoglobin.  Assessment: Encounter Diagnoses  Name Primary?  . Rectal bleeding Yes  . Persistent atrial fibrillation   . Acute on chronic diastolic CHF (congestive heart failure) ([place])   . Current use of long term anticoagulation     Heme positive stool and patient reports intermittent painless rectal bleeding.  Plan:  Due to complexity of medical issues and reported symptoms, the patient will be brought for a clinic visit in about 4 weeks, at which time hopefully pandemic related social distance restrictions will be partially lifted.  Thank you for the courtesy of this consult.  Please call me with any questions or concerns.  Nelida Meuse III  CC: Referring provider noted above

## 2018-06-23 ENCOUNTER — Telehealth: Payer: Self-pay

## 2018-06-23 ENCOUNTER — Telehealth (INDEPENDENT_AMBULATORY_CARE_PROVIDER_SITE_OTHER): Payer: Self-pay | Admitting: Cardiology

## 2018-06-23 ENCOUNTER — Encounter: Payer: Self-pay | Admitting: Cardiology

## 2018-06-23 VITALS — BP 168/115 | HR 82 | Ht 66.0 in | Wt 175.0 lb

## 2018-06-23 DIAGNOSIS — Z87891 Personal history of nicotine dependence: Secondary | ICD-10-CM

## 2018-06-23 DIAGNOSIS — J4489 Other specified chronic obstructive pulmonary disease: Secondary | ICD-10-CM

## 2018-06-23 DIAGNOSIS — I2510 Atherosclerotic heart disease of native coronary artery without angina pectoris: Secondary | ICD-10-CM

## 2018-06-23 DIAGNOSIS — J449 Chronic obstructive pulmonary disease, unspecified: Secondary | ICD-10-CM

## 2018-06-23 DIAGNOSIS — I4819 Other persistent atrial fibrillation: Secondary | ICD-10-CM

## 2018-06-23 DIAGNOSIS — Z8673 Personal history of transient ischemic attack (TIA), and cerebral infarction without residual deficits: Secondary | ICD-10-CM

## 2018-06-23 DIAGNOSIS — I10 Essential (primary) hypertension: Secondary | ICD-10-CM

## 2018-06-23 DIAGNOSIS — I5032 Chronic diastolic (congestive) heart failure: Secondary | ICD-10-CM

## 2018-06-23 DIAGNOSIS — F1411 Cocaine abuse, in remission: Secondary | ICD-10-CM

## 2018-06-23 DIAGNOSIS — I110 Hypertensive heart disease with heart failure: Secondary | ICD-10-CM

## 2018-06-23 MED ORDER — AMIODARONE HCL 200 MG PO TABS: 200.0000 mg | ORAL_TABLET | Freq: Every day | ORAL | 2 refills | Status: DC

## 2018-06-23 NOTE — Telephone Encounter (Signed)
Spoke with patient, informed her that I spoke with a check out at our office and she suggested the patient give [place] office a call to see if she could get an appointment sooner. Gave patient the office number and she said she would give the office a call.  Also gave patient AVS instructions. She voiced understanding.

## 2018-06-23 NOTE — Patient Instructions (Addendum)
Medication Instructions:  DECREASE Amiodarone to 200 mg Take 1 tablet once a day  If you need a refill on your cardiac medications before your next appointment, please call your pharmacy.   Lab work: None  If you have labs (blood work) drawn today and your tests are completely normal, you will receive your results only by: [name] MyChart Message (if you have MyChart) OR . A paper copy in the mail If you have any lab test that is abnormal or we need to change your treatment, we will call you to review the results.  Testing/Procedures: None   Follow-Up: At [facility], you and your health needs are our priority.  As part of our continuing mission to provide you with exceptional heart care, we have created designated Provider Care Teams.  These Care Teams include your primary Cardiologist (physician) and Advanced Practice Providers (APPs -  Physician Assistants and Nurse Practitioners) who all work together to provide you with the care you need, when you need it. . Follow up with Dr Oval Linsey as scheduled in May 2020.  Any Other Special Instructions Will Be Listed Below (If Applicable).  We are working to check the status of your pulmonary referral. We will be in touch with you soon.   MONITOR YOUR BLOOD PRESSURE  IF YOUR TOP NUMBER IS GREATER THAN 140 OR YOUR BOTTOM NUMBER IS GREATER THAN 85 THEN CONTACT THE OFFICE, HOPEFULLY YOUR READINGS GO BACK TO NORMAL ONCE YOU RESTART YOUR MEDICATIONS.

## 2018-06-23 NOTE — Progress Notes (Signed)
Virtual Visit via Telephone Note    Evaluation Performed:  Follow-up visit  This visit type was conducted due to national recommendations for restrictions regarding the COVID-19 Pandemic (e.g. social distancing).  This format is felt to be most appropriate for this patient at this time.  All issues noted in this document were discussed and addressed.  No physical exam was performed (except for noted visual exam findings with Video Visits).  Please refer to the patient's chart (MyChart message for video visits and phone note for telephone visits) for the patient's consent to telehealth for [facility].  Date:  06/23/2018   ID:  Morgan Bean, DOB February 21, 1966, MRN [id number]  Patient Location: Home [address] [place] [address]   Provider location:   [place]  PCP:  Ladell Pier, MD  Cardiologist:  [name], MD  Electrophysiologist:  None   Chief Complaint:  Follow up office visit  History of Present Illness:    Morgan Bean is a 53 y.o. female who presents via audio/video conferencing for a telehealth visit today.  The patient is a pleasant 53 year old female with a history of atrial fibrillation, diastolic heart failure, prior CVA, nonobstructive coronary disease, COPD, valvular heart disease with moderate to severe MR, chronic renal insufficiency stage III, and prior substance abuse.  In December 2019 she had atrial fibrillation and diastolic heart failure in the setting of cocaine abuse.  She underwent TEE cardioversion but was found to be back in atrial fibrillation in February 2020.  Echocardiogram showed preserved LV function with an EF of 50 to 55% with moderate to severe MR which was confirmed by TEE in December 2019.  The patient was placed on amiodarone and Xarelto.  Pulmonary function studies suggested underlying lung disease.  Referral to the atrial fibrillation clinic was considered to consider an alternative therapy.  The patient asked that she  get an opinion from a pulmonologist before stopping Amiodarone since she converted to NSR on it (EKG 05/30/2018). This referral is pending.  It was noted that changing her to Tikosyn would require Amiodarone washout of several weeks.   The patient was contacted today for a follow up visit.  Because of technical difficulties on her end she was switched to a phone visit.  She has been doing well- no increased SOB or palpitations.  She had issues obtaining her medications but expects them to arrive by [employer] today.  She is staying at her mother's house.  Her weight is up 1 lb from her LOV.   The patient does not symptoms concerning for COVID-19 infection (fever, chills, cough, or new SHORTNESS OF BREATH).    Prior CV studies:   The following studies were reviewed today:  Past Medical History:  Diagnosis Date  . Anemia of other chronic disease 11/13/2013  . Anginal pain ([place])    none in past year  . Anxiety   . Asthma   . Candidiasis 06/23/2013  . Chronic diastolic CHF (congestive heart failure) ([place])   . Constipation   . Coronary artery disease    Lexiscan myoview (10/12) with significant ST depression upon Lexiscan injection but no ischemia or infarction on perfusion images. Left heart cath (10/12): 30% mid LAD, 30% ostial D1, 50% ostial D2.    [name] Cough 11/13/2013  . DCIS (ductal carcinoma in situ) of breast   . Depression   . Headache 02/22/2014  . Hx of cardiovascular stress test    Lexiscan Myoview (2/16): Breast attenuation artifact, no ischemia, EF  64%; low risk  . Hyperlipemia   . Hypertension   . Iron deficiency    hx of  . Leukocytoclastic vasculitis ([place])    Rash across lower body, occurred in 9/11, diagnosed by skin biopsy. ANA positive. Thought to be secondary to cocaine use.  . Leukopenia 03/21/2013  . Lymphadenopathy 03/21/2013  . Lymphadenopathy 01/30/2014  . Mental disorder   . Pancytopenia ([place])   . Pancytopenia, acquired ([place]) 07/16/2015  . Polysubstance abuse ([place])     Prior cocaine  . Pulmonary HTN ([place])    Echo (9/11) with EF 65%, mild LVH, mild AI, mild MR, moderate to possibly severe TR with PA systolic pressure 52 mmHg. Echo (10/12): severe LV hypertrophy, EF 60-65%, mild MR, mild AI, moderate to severe tricuspid regurgitation, PA systolic pressure 38 mmHg.  Echo 4/14: moderate LVH, EF 65%, normal wall motion, diastolic dysfunction, mild AI, mild MR  . Shortness of breath    a. PFTs 5/14:  FEV1/FVC 99% predicted; FVC 60% predicted; DLCO mildly reduced, mild restriction, air trapping.;  b.  seen by pulmo (Dr. [name]) 5/14: mainly upper airway symptoms - ACE d/c'd (sx's better)  . Stroke ([place]) 2010ish  . Syphilis 04/25/2013  . Tobacco abuse   . Tricuspid regurgitation   . Unspecified deficiency anemia 03/29/2013   Past Surgical History:  Procedure Laterality Date  . ANKLE SURGERY     right  . BONE MARROW ASPIRATION    . CARDIAC CATHETERIZATION    . CARDIAC CATHETERIZATION N/A 04/23/2015   Procedure: Right Heart Cath;  Surgeon: [name], MD;  Location: [facility];  Service: Cardiovascular;  Laterality: N/A;  . CARDIOVERSION N/A 03/11/2018   Procedure: CARDIOVERSION;  Surgeon: [name], MD;  Location: [facility] ENDOSCOPY;  Service: Cardiovascular;  Laterality: N/A;  . I&D EXTREMITY  08/09/2011   Procedure: IRRIGATION AND DEBRIDEMENT EXTREMITY;  Surgeon: [name], MD;  Location: [facility];  Service: General;  Laterality: Left;  i & D Left axilla abscess  . LYMPH NODE BIOPSY N/A 04/05/2013   Procedure: EXCISIONAL BIOPSY RIGHT SUBMANDIBULAR NODE, NASAL ENDOSCOPIC WITH BIOPSY NASAL PHARYNX;  Surgeon: [name], MD;  Location: [facility];  Service: ENT;  Laterality: N/A;  . TEE WITHOUT CARDIOVERSION N/A 03/11/2018   Procedure: TRANSESOPHAGEAL ECHOCARDIOGRAM (TEE);  Surgeon: [name], MD;  Location[facility] ENDOSCOPY;  Service: Cardiovascular;  Laterality: N/A;     Current Meds  Medication Sig  . albuterol (PROVENTIL HFA;VENTOLIN HFA) 108 (90  Base) MCG/ACT inhaler Inhale 2 puffs into the lungs every 6 (six) hours as needed for wheezing or shortness of breath.  [name] amiodarone (PACERONE) 200 MG tablet Take 1 tablet (200 mg total) by mouth 2 (two) times daily.  [name] atorvastatin (LIPITOR) 40 MG tablet Take 1 tablet (40 mg total) by mouth at bedtime.  [name] buPROPion (WELLBUTRIN SR) 150 MG 12 hr tablet Take 1 tablet (150 mg total) by mouth 2 (two) times daily.  . citalopram (CELEXA) 40 MG tablet Take 1 tablet (40 mg total) by mouth daily.  . cloNIDine (CATAPRES) 0.1 MG tablet Take 1 tablet (0.1 mg total) by mouth 2 (two) times daily.  [name] diltiazem (CARDIZEM CD) 180 MG 24 hr capsule Take 1 capsule (180 mg total) by mouth daily.  . furosemide (LASIX) 40 MG tablet Take 1 tablet (40 mg total) by mouth 2 (two) times daily.  . hydrOXYzine (VISTARIL) 25 MG capsule Take 1 capsule (25 mg total) by mouth 4 (four) times daily as  needed for anxiety.  [name] lisinopril (PRINIVIL,ZESTRIL) 10 MG tablet Take 1 tablet (10 mg total) by mouth daily.  [name] loratadine (CLARITIN) 10 MG tablet Take 1 tablet (10 mg total) by mouth daily as needed for allergies.  . mometasone-formoterol (DULERA) 100-5 MCG/ACT AERO Inhale 2 puffs into the lungs 2 (two) times daily.  . rivaroxaban (XARELTO) 20 MG TABS tablet Take 1 tablet (20 mg total) by mouth daily with supper.  . topiramate (TOPAMAX) 25 MG tablet Take 2 tablets (50 mg total) by mouth at bedtime.  . traZODone (DESYREL) 150 MG tablet Take 1 tablet (150 mg total) by mouth at bedtime.     Allergies:   Ciprofloxacin   Social History   Tobacco Use  . Smoking status: Former Smoker    Packs/day: 0.00    Years: 30.00    Pack years: 0.00    Types: Cigarettes    Last attempt to quit: 01/23/2018    Years since quitting: 0.4  . Smokeless tobacco: Never Used  Substance Use Topics  . Alcohol use: No    Alcohol/week: 0.0 standard drinks    Comment: pint of wine 2-3 times a week--ocassional - quit 2012  . Drug use: No    Frequency:  1.0 times per week    Types: "Crack" cocaine    Comment: none since 02/04/14     Family Hx: The patient's family history includes Breast cancer in her maternal grandmother; Coronary artery disease in her father and mother; Diabetes in her father, maternal grandmother, and mother; Heart attack in her father and maternal grandmother; Hypertension in her father; Stroke in her maternal grandmother.  ROS:   Please see the history of present illness.    All other systems reviewed and are negative.   Labs/Other Tests and Data Reviewed:    Recent Labs: 03/09/2018: Magnesium 2.2; TSH 2.563 04/25/2018: B Natriuretic Peptide 133.9; Hemoglobin 12.0; Platelets 195 05/23/2018: ALT 25; BUN 21; Creatinine, Ser 1.45; Potassium 4.5; Sodium 140   Recent Lipid Panel Lab Results  Component Value Date/Time   CHOL 154 08/21/2015 12:12 PM   TRIG 109 08/21/2015 12:12 PM   HDL 43 (L) 08/21/2015 12:12 PM   CHOLHDL 3.6 08/21/2015 12:12 PM   LDLCALC 89 08/21/2015 12:12 PM   LDLDIRECT 142.1 12/25/2010 08:46 AM    Wt Readings from Last 3 Encounters:  06/23/18 175 lb (79.4 kg)  05/30/18 174 lb 3.2 oz (79 kg)  05/23/18 171 lb 9.6 oz (77.8 kg)     Exam:    Vital Signs:  BP (!) 168/115   Pulse 82   Ht 5\' 6"  (1.676 m)   Wt 175 lb (79.4 kg)   LMP 05/10/2013   BMI 28.25 kg/m     ASSESSMENT & PLAN:    PAF- Holding NSR on Amiodarone- decrease to 200 mg daily  Chronic anticoagulation- On Xarelto but has trouble affording this   Chronic diastolic CHF- Stable by history  Uncontrolled HTN- Non compliance with medications- she tells me this has been rectified.  She'll monitor her B/P at home   COPD- Abnormal PFTs- pulmonary consult in the works  Substance abuse- Appears to be in remission    COVID-19 Education: The signs and symptoms of COVID-19 were discussed with the patient and how to seek care for testing (follow up with PCP or arrange E-visit).  The importance of social distancing was  discussed today.  Patient Risk:   After full review of this patients clinical status, I feel that they are  at least moderate risk at this time.  Time:   Today, I have spent 25 minutes with the patient with telehealth technology discussing atrial fibrillation, medication compliance, COPD.     Medication Adjustments/Labs and Tests Ordered: Current medicines are reviewed at length with the patient today.  Concerns regarding medicines are outlined above.  Tests Ordered: No orders of the defined types were placed in this encounter.  Medication Changes: Decrease Amiodarone to 200 mg daily   Disposition:  Keep follow up with Dr Oval Linsey in May as scheduled.  Angelena Form, PA-C  06/23/2018 9:42 AM    [facility]

## 2018-06-27 ENCOUNTER — Encounter: Payer: Self-pay | Admitting: Internal Medicine

## 2018-06-27 NOTE — Telephone Encounter (Signed)
Patient has appointment 4/8 with Dr Melvyn Novas

## 2018-06-28 ENCOUNTER — Telehealth: Payer: Self-pay | Admitting: *Deleted

## 2018-06-28 ENCOUNTER — Telehealth: Payer: Self-pay | Admitting: Internal Medicine

## 2018-06-28 ENCOUNTER — Telehealth (HOSPITAL_BASED_OUTPATIENT_CLINIC_OR_DEPARTMENT_OTHER): Payer: Self-pay | Admitting: Internal Medicine

## 2018-06-28 DIAGNOSIS — M545 Low back pain, unspecified: Secondary | ICD-10-CM

## 2018-06-28 NOTE — Telephone Encounter (Signed)
Patient verified DOB  Patient is aware of urine sample being needed to treat her concern for acute back pain possibly related to UTI. Patient will try to drop a urine today so she can complete a tele visit tomorrow. Patient is aware of prescreening needing to be completed prior to her reporting to the office on tomorrow if she is unable to come in today or sample.

## 2018-06-28 NOTE — Telephone Encounter (Signed)
Paitent anwser no to all questions  Coronavirus (COVID-19) Are you at risk?  Are you at risk for the Coronavirus (COVID-19)?  To be considered HIGH RISK for Coronavirus (COVID-19), you have to meet the following criteria:  . Traveled to [place], [place], [place], [place] or [place]; or in the [place] to [place], [place], [place], or [place]; and have fever, cough, and shortness of breath within the last 2 weeks of travel OR . Been in close contact with a person diagnosed with COVID-19 within the last 2 weeks and have fever, cough, and shortness of breath . IF YOU DO NOT MEET THESE CRITERIA, YOU ARE CONSIDERED LOW RISK FOR COVID-19.  What to do if you are HIGH RISK for COVID-19?  [name] If you are having a medical emergency, call 911. . Seek medical care right away. Before you go to a doctor's office, urgent care or emergency department, call ahead and tell them about your recent travel, contact with someone diagnosed with COVID-19, and your symptoms. You should receive instructions from your physician's office regarding next steps of care.  . When you arrive at healthcare provider, tell the healthcare staff immediately you have returned from visiting [place], [place], [place], [place] or [place]; or traveled in the [place] to [place], [place], [place], or [place]; in the last two weeks or you have been in close contact with a person diagnosed with COVID-19 in the last 2 weeks.   . Tell the health care staff about your symptoms: fever, cough and shortness of breath. . After you have been seen by a medical provider, you will be either: o Tested for (COVID-19) and discharged home on quarantine except to seek medical care if symptoms worsen, and asked to  - Stay home and avoid contact with others until you get your results (4-5 days)  - Avoid travel on public transportation if possible (such as bus, train, or airplane) or o Sent to the Emergency Department by EMS for evaluation,  COVID-19 testing, and possible admission depending on your condition and test results.  What to do if you are LOW RISK for COVID-19?  Reduce your risk of any infection by using the same precautions used for avoiding the common cold or flu:  [name] Wash your hands often with soap and warm water for at least 20 seconds.  If soap and water are not readily available, use an alcohol-based hand sanitizer with at least 60% alcohol.  . If coughing or sneezing, cover your mouth and nose by coughing or sneezing into the elbow areas of your shirt or coat, into a tissue or into your sleeve (not your hands). . Avoid shaking hands with others and consider head nods or verbal greetings only. . Avoid touching your eyes, nose, or mouth with unwashed hands.  . Avoid close contact with people who are sick. . Avoid places or events with large numbers of people in one location, like concerts or sporting events. . Carefully consider travel plans you have or are making. . If you are planning any travel outside or inside the [place], visit the CDC's Travelers' Health webpage for the latest health notices. . If you have some symptoms but not all symptoms, continue to monitor at home and seek medical attention if your symptoms worsen. . If you are having a medical emergency, call 911.   ADDITIONAL HEALTHCARE OPTIONS FOR PATIENTS  [facility] / e-Visit: [email or web address]         [facility]: [facility]: [phone]                   [facility]: [phone]

## 2018-06-28 NOTE — Telephone Encounter (Signed)
Called the patient there was no answer lvm for the patient to call back.

## 2018-06-28 NOTE — Telephone Encounter (Signed)
LMTCB

## 2018-06-28 NOTE — Telephone Encounter (Signed)
Patient is returning phone call.  Patient phone number is [phone].

## 2018-06-28 NOTE — Telephone Encounter (Signed)
Pt was scheduled for consult with Wert 06/29/2018 at 2 pm for abnormal PFT  No documentation of covid screen on appt notes  LMTCB to screen pt

## 2018-06-28 NOTE — Telephone Encounter (Signed)
Patient is returning phone call.  Patient phone number is [phone].

## 2018-06-29 ENCOUNTER — Encounter: Payer: Self-pay | Admitting: Internal Medicine

## 2018-06-29 ENCOUNTER — Ambulatory Visit (INDEPENDENT_AMBULATORY_CARE_PROVIDER_SITE_OTHER): Payer: Self-pay | Admitting: Internal Medicine

## 2018-06-29 ENCOUNTER — Ambulatory Visit: Payer: Self-pay | Admitting: Family Medicine

## 2018-06-29 ENCOUNTER — Other Ambulatory Visit: Payer: Self-pay

## 2018-06-29 VITALS — BP 118/82 | HR 81 | Ht 66.0 in | Wt 183.0 lb

## 2018-06-29 DIAGNOSIS — I272 Pulmonary hypertension, unspecified: Secondary | ICD-10-CM

## 2018-06-29 DIAGNOSIS — J449 Chronic obstructive pulmonary disease, unspecified: Secondary | ICD-10-CM

## 2018-06-29 DIAGNOSIS — R0609 Other forms of dyspnea: Secondary | ICD-10-CM

## 2018-06-29 DIAGNOSIS — R05 Cough: Secondary | ICD-10-CM

## 2018-06-29 DIAGNOSIS — R058 Other specified cough: Secondary | ICD-10-CM

## 2018-06-29 LAB — POCT URINALYSIS DIP (CLINITEK)
Bilirubin, UA: NEGATIVE
Blood, UA: NEGATIVE
Glucose, UA: NEGATIVE mg/dL
Ketones, POC UA: NEGATIVE mg/dL
Leukocytes, UA: NEGATIVE
Nitrite, UA: NEGATIVE
POC PROTEIN,UA: NEGATIVE
Spec Grav, UA: 1.01 (ref 1.010–1.025)
Urobilinogen, UA: 0.2 E.U./dL
pH, UA: 6.5 (ref 5.0–8.0)

## 2018-06-29 MED ORDER — MOMETASONE FURO-FORMOTEROL FUM 100-5 MCG/ACT IN AERO: 2.0000 | INHALATION_SPRAY | Freq: Two times a day (BID) | RESPIRATORY_TRACT | 0 refills | Status: DC

## 2018-06-29 NOTE — Telephone Encounter (Signed)
Patient presented for low back pain. Patient states this pain has been experienced before when she had a UTI. Patients POCT did not show any infection or concern. Patient needs to be advised for low acute back pain.

## 2018-06-29 NOTE — Progress Notes (Signed)
Morgan Bean, female    DOB: 08-02-1965, 53 y.o.   MRN: [id number]   Brief patient profile:  55 yobf quit smoking 01/2018 and prev eval by Morgan Bean in pulmonary clinic 2014 with mild restrictive changes and cough attributed to ACEi which apparently resolved p d/c referred to pulmonary clinic 06/29/2018 by Dr   Morgan Bean for abn pfts on amiodarone new start 05/23/2018 for A flutter s/p ablation and carioversion 02/2018     History of Present Illness  06/29/2018  Pulmonary/ 1st office eval/Morgan Bean  Chief Complaint  Patient presents with   Pulmonary Consult    Referred by Dr. Skeet Bean for eval of abormal PFT's. She takes Amiodarone. She uses an albuterol 3 x per wk on average.   Dyspnea:  Walks on treadmill x 15 min / "real slow stopping"(does not know the settings)  / flat grade daily x one week/ doe x  back uphill  to house from [place]  is struggle and stops half way getting easier than it was while smoking  Cough: better since quit Morgan Bean maybe an hour p lie down/ mucus is clear / some am cough x 15 min clears it  Sleep: on side / no pillows / flat bed  SABA use: can't really be sure helps at all / same with dulera but hfa quite poor.  No obvious day to day or daytime variability or assoc excess/ purulent sputum or mucus plugs or hemoptysis or cp or chest tightness, subjective wheeze or overt sinus or hb symptoms.   Sleep os as above without nocturnal  or early am exacerbation  of respiratory  c/o's or need for noct saba. Also denies any obvious fluctuation of symptoms with weather or environmental changes or other aggravating or alleviating factors except as outlined above   No unusual exposure hx or h/o childhood pna/ asthma or knowledge of premature birth.  Current Allergies, Complete Past Medical History, Past Surgical History, Family History, and Social History were reviewed in [employer] record.  ROS  The following are not active complaints unless  bolded Hoarseness, sore throat, dysphagia/globus sensation, dental problems, itching, sneezing,  nasal congestion or discharge of excess mucus or purulent secretions, ear ache,   fever, chills, sweats, unintended wt loss or wt gain, classically pleuritic or exertional cp,  orthopnea pnd or arm/hand swelling  or leg swelling, presyncope, palpitations, abdominal pain, anorexia, nausea, vomiting, diarrhea  or change in bowel habits or change in bladder habits, change in stools or change in urine, dysuria, hematuria,  rash, arthralgias, visual complaints, headache, numbness, weakness or ataxia or problems with walking or coordination,  change in mood or  memory.             Past Medical History:  Diagnosis Date   Anemia of other chronic disease 11/13/2013   Anginal pain ([place])    none in past year   Anxiety    Asthma    Candidiasis 06/23/2013   Chronic diastolic CHF (congestive heart failure) ([name])    Constipation    Coronary artery disease    Lexiscan myoview (10/12) with significant ST depression upon Lexiscan injection but no ischemia or infarction on perfusion images. Left heart cath (10/12): 30% mid LAD, 30% ostial D1, 50% ostial D2.     Cough 11/13/2013   DCIS (ductal carcinoma in situ) of breast    Depression    Headache 02/22/2014   Hx of cardiovascular stress test    Lexiscan Myoview (2/16): Breast attenuation artifact, no  ischemia, EF 64%; low risk   Hyperlipemia    Hypertension    Iron deficiency    hx of   Leukocytoclastic vasculitis ([name])    Rash across lower body, occurred in 9/11, diagnosed by skin biopsy. ANA positive. Thought to be secondary to cocaine use.   Leukopenia 03/21/2013   Lymphadenopathy 03/21/2013   Lymphadenopathy 01/30/2014   Mental disorder    Pancytopenia ([place])    Pancytopenia, acquired ([place]) 07/16/2015   Polysubstance abuse ([place])    Prior cocaine   Pulmonary HTN ([place])    Echo (9/11) with EF 65%, mild LVH, mild AI, mild MR,  moderate to possibly severe TR with PA systolic pressure 52 mmHg. Echo (10/12): severe LV hypertrophy, EF 60-65%, mild MR, mild AI, moderate to severe tricuspid regurgitation, PA systolic pressure 38 mmHg.  Echo 4/14: moderate LVH, EF 65%, normal wall motion, diastolic dysfunction, mild AI, mild MR   Shortness of breath    a. PFTs 5/14:  FEV1/FVC 99% predicted; FVC 60% predicted; DLCO mildly reduced, mild restriction, air trapping.;  b.  seen by pulmo (Dr. Gwenette Bean) 5/14: mainly upper airway symptoms - ACE d/c'd (sx's better)   [facility]) 2010ish   Syphilis 04/25/2013   Tobacco abuse    Tricuspid regurgitation    Unspecified deficiency anemia 03/29/2013    Outpatient Medications Prior to Visit  Medication Sig Dispense Refill   albuterol (PROVENTIL HFA;VENTOLIN HFA) 108 (90 Base) MCG/ACT inhaler Inhale 2 puffs into the lungs every 6 (six) hours as needed for wheezing or shortness of breath. 54 Inhaler 3   amiodarone (PACERONE) 200 MG tablet Take 1 tablet (200 mg total) by mouth daily. 90 tablet 2   atorvastatin (LIPITOR) 40 MG tablet Take 1 tablet (40 mg total) by mouth at bedtime. 30 tablet 6   buPROPion (WELLBUTRIN SR) 150 MG 12 hr tablet Take 1 tablet (150 mg total) by mouth 2 (two) times daily. 60 tablet 2   citalopram (CELEXA) 40 MG tablet Take 1 tablet (40 mg total) by mouth daily. 30 tablet 2   cloNIDine (CATAPRES) 0.1 MG tablet Take 1 tablet (0.1 mg total) by mouth 2 (two) times daily. 60 tablet 3   diltiazem (CARDIZEM CD) 180 MG 24 hr capsule Take 1 capsule (180 mg total) by mouth daily. 60 capsule 2   furosemide (LASIX) 40 MG tablet Take 1 tablet (40 mg total) by mouth 2 (two) times daily. 90 tablet 3   lisinopril (PRINIVIL,ZESTRIL) 10 MG tablet Take 1 tablet (10 mg total) by mouth daily. 90 tablet 3   loratadine (CLARITIN) 10 MG tablet Take 1 tablet (10 mg total) by mouth daily as needed for allergies. 90 tablet 1   mometasone-formoterol (DULERA) 100-5 MCG/ACT AERO Inhale  2 puffs into the lungs 2 (two) times daily. 13 g 1   rivaroxaban (XARELTO) 20 MG TABS tablet Take 1 tablet (20 mg total) by mouth daily with supper. 30 tablet 2   topiramate (TOPAMAX) 25 MG tablet Take 2 tablets (50 mg total) by mouth at bedtime. 60 tablet 2   traZODone (DESYREL) 150 MG tablet Take 1 tablet (150 mg total) by mouth at bedtime. 30 tablet 2   hydrOXYzine (VISTARIL) 25 MG capsule Take 1 capsule (25 mg total) by mouth 4 (four) times daily as needed for anxiety. 30 capsule 0      Objective:     BP 118/82 (BP Location: Left Arm, Cuff Size: Normal)    Pulse 81    Ht 5\' 6"  (1.676  m)    Wt 183 lb (83 kg)    LMP 05/10/2013    SpO2 98%    BMI 29.54 kg/m   SpO2: 98 % RA  Very obese bf W/c bound due to leg weakness/ numbness nad    HEENT: nl dentition, turbinates bilaterally, and oropharynx. Nl external ear canals without cough reflex   NECK :  without JVD/Nodes/TM/ nl carotid upstrokes bilaterally   LUNGS: no acc muscle use,  Nl contour chest which is clear to A and P bilaterally without cough on insp or exp maneuvers   CV:  RRR  no s3 or murmur or increase in P2, and 2+ pitting both LE's   ABD:  Quite obese  nontender with limited inspiratory excursion  No bruits or organomegaly appreciated, bowel sounds nl  MS:    ext warm without deformities, calf tenderness, cyanosis or clubbing No obvious joint restrictions   SKIN: warm and dry without lesions    NEURO:  alert, approp, nl sensorium with  no motor or cerebellar deficits apparent.     I personally reviewed images and agree with radiology impression as follows:  CXR:   04/25/2018 Stable cardiomegaly. No edema or consolidation. No evident adenopathy.     Assessment   DOE (dyspnea on exertion)  Quit smoking 01/2018  PFT's 07/2012:  FEV1 1.53 (60%), but ratio totally normal at 81.  Mild airtrapping, mild restriction, DLCO 68% Echo 03/09/18: Left ventricle: The cavity size was normal. Wall thickness  was normal. Systolic function was normal. The estimated ejection fraction was in the range of 50% to 55%.  - Aortic valve: There was mild regurgitation. - Mitral valve: There was moderate to severe regurgitation. - Left atrium: The atrium was severely dilated. - Atrial septum: No defect or patent foramen ovale was identified. - Tricuspid valve: There was moderate regurgitation. - Pulmonary arteries: PA peak pressure: 39 mm Hg (S). Amiodarone new start 05/23/2018 - PFT's  05/31/2018   FEV1 1.77 (76 % ) ratio 0.81  p no % improvement from saba p ? prior to study with DLCO  56 % corrects to 95  % for alv volume and no obstruction on f/v loops  Although she has just started regular walking, it appears she is trending up in terms of ex tol s/p smoking and is more limited by legs and chf than any pulmonary concern though she likely has mild restrictive changes from obesity and no evidence of amiodarone toxicity at this point.   Patients typically have been on amiodarone for 6-12 months before this complication manifests.  Of note, serial clinical evaluation for symptoms such as cough dyspnea or fevers is  the preferred method of monitoring for pulmonary toxicity because a decrease in DLCO or lung volumes is a nonspecific for toxicity. Pathologically amiodarone pulmonary toxicity may appear as interstitial pneumonitis, eosinophilic pneumonia, organizing pneumonia, pulmonary fibrosis or less commonly as diffuse alveolar hemorrhage, pulmonary nodules or pleural effusions.  Risk factors for pulmonary toxicity include age greater than 64, daily dose greater than equal to 400 mg, a high cumulative dose, or pre-existing lung disease   >> > she has no risk factors so ok to repeat in 6 months unless downward trend in pfts.    Pulmonary hypertension Echo reviewed, most likely this is WHO III related to L sided pressures based on the echo 02/2018 showing severe LAE   She is already fully anticoagulated so  less concern for occult PE contributing but could have significant OSA >  cannot order routine studies now but overnight pulse ox reasonable to exclude significant desats and offer 02 trial which might help with management of chf.   >>>  Ordered ono RA      Upper airway cough syndrome On ACEi as of ov 06/29/2018 previously strongly rec by Morgan Bean to leave off in 2014   Upper airway cough syndrome (previously labeled PNDS),  is so named because it's frequently impossible to sort out how much is  CR/sinusitis with freq throat clearing (which can be related to primary GERD)   vs  causing  secondary (" extra esophageal")  GERD from wide swings in gastric pressure that occur with throat clearing, often  promoting self use of mint and menthol lozenges that reduce the lower esophageal sphincter tone and exacerbate the problem further in a cyclical fashion.   These are the same pts (now being labeled as having "irritable larynx syndrome" by some cough centers) who not infrequently have a history of having failed to tolerate ace inhibitors,  dry powder inhalers or biphosphonates or report having atypical/extraesophageal reflux symptoms that don't respond to standard doses of PPI  and are easily confused as having aecopd or asthma flares (neither of which do Dr Morgan Bean nor I believe to be the case here - see copd gold 0)   She does not recall ov with Morgan Bean or the results of trial off acei but is convinced acei works better than anything else for her bp and this problem is more of a nuisance and has improved some off cigs but still has globus sensation so if worsens needs trial of arb instead.       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 05/23/18  Quit smoking 01/2018  - no obst by pfts 05/31/2018 and nl curvature  - 06/29/2018  After extensive coaching inhaler device,  effectiveness =    50% from a baseline of 0   I very strongly doubt she has any asthma or copd but may have had a smoker's  CB while smoking that has mostly resolved  I reviewed the Fletcher curve with the patient that basically indicates  if you quit smoking when your best day FEV1 is still well preserved (as is clearly  the case here)  it is highly unlikely you will progress to severe disease and informed the patient there was  no medication on the market that has proven to alter the curve/ its downward trajectory  or the likelihood of progression of their disease(unlike other chronic medical conditions such as atheroclerosis where we do think we can change the natural hx with risk reducing meds)    Therefore stopping smoking and maintaining abstinence are  the most important aspects of care, not choice of inhalers or for that matter, doctors.   Treatment other than smoking cessation  is entirely directed by severity of symptoms and focused also on reducing exacerbations, not attempting to change the natural history of the disease.    Ok to continue dulera 100  if she feels it helps (especially if uses correctly)  but low threshold from my perspective to just use prn saba a see how it goes          Total time devoted to counseling  > 50 % of initial 60 min office visit:  review case with pt/  device teaching which extended face to face time for this visit /  discussion of options/alternatives/ personally creating written customized instructions  in presence of pt  then going over those specific  Instructions directly with the pt including how to use all of the meds but in particular covering each new medication in detail and the difference between the maintenance= "automatic" meds and the prns using an action plan format for the latter (If this problem/symptom => do that organization reading Left to right).  Please see AVS from this visit for a full list of these instructions which I personally wrote for this pt and  are unique to this visit.      Christinia Gully, MD 06/29/2018

## 2018-06-29 NOTE — Patient Instructions (Addendum)
Congratulations on stopping smoking   Plan A = Automatic = dulera 100 Take 2 puffs first thing in am and then another 2 puffs about 12 hours later.   Work on inhaler technique:  relax and gently blow all the way out then take a nice smooth deep breath back in, triggering the inhaler at same time you start breathing in.  Hold for up to 5 seconds if you can. Blow out thru nose. Rinse and gargle with water when done   Plan B = Backup Only use your albuterol inhaler (proventil)  as a rescue medication to be used if you can't catch your breath by resting or doing a relaxed purse lip breathing pattern.  - The less you use it, the better it will work when you need it. - Ok to use the inhaler up to 2 puffs  every 4 hours if you must but call for appointment if use goes up over your usual need - Don't leave home without it !!  (think of it like the spare tire for your car)   Keep up the walking and build up to a minimum of 30 minutes if possible - call if losing ground    Please schedule a follow up visit in 3 months but call sooner if needed  with all medications /inhalers/ solutions in hand so we can verify exactly what you are taking. This includes all medications from all doctors and over the counters  - don't take your inhalers that morning and we will repeat your full pfts.

## 2018-06-29 NOTE — Telephone Encounter (Signed)
Message sent to [facility] regarding appointment with Dr Melvyn Novas today

## 2018-06-30 ENCOUNTER — Telehealth: Payer: Self-pay | Admitting: *Deleted

## 2018-06-30 ENCOUNTER — Encounter: Payer: Self-pay | Admitting: Internal Medicine

## 2018-06-30 DIAGNOSIS — R058 Other specified cough: Secondary | ICD-10-CM | POA: Insufficient documentation

## 2018-06-30 DIAGNOSIS — R05 Cough: Secondary | ICD-10-CM | POA: Insufficient documentation

## 2018-06-30 NOTE — Assessment & Plan Note (Addendum)
Quit smoking 01/2018  - no obst by pfts 05/31/2018 and nl curvature  - 06/29/2018  After extensive coaching inhaler device,  effectiveness =    50% from a baseline of 0   I very strongly doubt she has any asthma or copd but may have had a smoker's CB while smoking that has mostly resolved  I reviewed the Fletcher curve with the patient that basically indicates  if you quit smoking when your best day FEV1 is still well preserved (as is clearly  the case here)  it is highly unlikely you will progress to severe disease and informed the patient there was  no medication on the market that has proven to alter the curve/ its downward trajectory  or the likelihood of progression of their disease(unlike other chronic medical conditions such as atheroclerosis where we do think we can change the natural hx with risk reducing meds)    Therefore stopping smoking and maintaining abstinence are  the most important aspects of care, not choice of inhalers or for that matter, doctors.   Treatment other than smoking cessation  is entirely directed by severity of symptoms and focused also on reducing exacerbations, not attempting to change the natural history of the disease.    Ok to continue dulera 100  if she feels it helps (especially if uses correctly)  but low threshold from my perspective to just use prn saba a see how it goes      Total time devoted to counseling  > 50 % of initial 60 min office visit:  review case with pt/  device teaching which extended face to face time for this visit /  discussion of options/alternatives/ personally creating written customized instructions  in presence of pt  then going over those specific  Instructions directly with the pt including how to use all of the meds but in particular covering each new medication in detail and the difference between the maintenance= "automatic" meds and the prns using an action plan format for the latter (If this problem/symptom => do that organization  reading Left to right).  Please see AVS from this visit for a full list of these instructions which I personally wrote for this pt and  are unique to this visit.

## 2018-06-30 NOTE — Telephone Encounter (Signed)
Pt is calling back [phone]

## 2018-06-30 NOTE — Telephone Encounter (Signed)
She needs a tele visit

## 2018-06-30 NOTE — Assessment & Plan Note (Addendum)
Quit smoking 01/2018  PFT's 07/2012:  FEV1 1.53 (60%), but ratio totally normal at 81.  Mild airtrapping, mild restriction, DLCO 68% Echo 03/09/18: Left ventricle: The cavity size was normal. Wall thickness was normal. Systolic function was normal. The estimated ejection fraction was in the range of 50% to 55%.  - Aortic valve: There was mild regurgitation. - Mitral valve: There was moderate to severe regurgitation. - Left atrium: The atrium was severely dilated. - Atrial septum: No defect or patent foramen ovale was identified. - Tricuspid valve: There was moderate regurgitation. - Pulmonary arteries: PA peak pressure: 39 mm Hg (S). Amiodarone new start 05/23/2018 - PFT's  05/31/2018   FEV1 1.77 (76 % ) ratio 0.81  p no % improvement from saba p ? prior to study with DLCO  56 % corrects to 95  % for alv volume and no obstruction on f/v loops  Although she has just started regular walking, it appears she is trending up in terms of ex tol s/p smoking and is more limited by legs and chf than any pulmonary concern though she likely has mild restrictive changes from obesity and no evidence of amiodarone toxicity at this point.   Patients typically have been on amiodarone for 6-12 months before this complication manifests.  Of note, serial clinical evaluation for symptoms such as cough dyspnea or fevers is  the preferred method of monitoring for pulmonary toxicity because a decrease in DLCO or lung volumes is a nonspecific for toxicity. Pathologically amiodarone pulmonary toxicity may appear as interstitial pneumonitis, eosinophilic pneumonia, organizing pneumonia, pulmonary fibrosis or less commonly as diffuse alveolar hemorrhage, pulmonary nodules or pleural effusions.  Risk factors for pulmonary toxicity include age greater than 24, daily dose greater than equal to 400 mg, a high cumulative dose, or pre-existing lung disease  > she has no risk factors so ok to repeat in 6 months unless downward  trend in pfts.

## 2018-06-30 NOTE — Assessment & Plan Note (Signed)
Echo reviewed, most likely this is WHO III related to L sided pressures based on the echo 02/2018 showing severe LAE   She is already fully anticoagulated so less concern for occult PE contributing but could have significant OSA > cannot order routine studies now but overnight pulse ox reasonable to exclude significant desats and offer 02 trial which might help with management of chf.   >>>  Ordered ono RA

## 2018-06-30 NOTE — Telephone Encounter (Signed)
Please schedule another tele

## 2018-06-30 NOTE — Telephone Encounter (Signed)
LMTCB

## 2018-06-30 NOTE — Telephone Encounter (Signed)
-----   Message from Tanda Rockers, MD sent at 06/30/2018  9:09 AM EDT ----- After review of her records rec ono RA as can't do formal sleep study now

## 2018-06-30 NOTE — Assessment & Plan Note (Signed)
On ACEi as of ov 06/29/2018 previously strongly rec by Clance to leave off in 2014   Upper airway cough syndrome (previously labeled PNDS),  is so named because it's frequently impossible to sort out how much is  CR/sinusitis with freq throat clearing (which can be related to primary GERD)   vs  causing  secondary (" extra esophageal")  GERD from wide swings in gastric pressure that occur with throat clearing, often  promoting self use of mint and menthol lozenges that reduce the lower esophageal sphincter tone and exacerbate the problem further in a cyclical fashion.   These are the same pts (now being labeled as having "irritable larynx syndrome" by some cough centers) who not infrequently have a history of having failed to tolerate ace inhibitors,  dry powder inhalers or biphosphonates or report having atypical/extraesophageal reflux symptoms that don't respond to standard doses of PPI  and are easily confused as having aecopd or asthma flares (neither of which do Dr [name] nor I believe to be the case here - see copd gold 0)   She does not recall ov with Clance or the results of trial off acei but is convinced acei works better than anything else for her bp and this problem is more of a nuisance and has improved some off cigs but still has globus sensation so if worsens needs trial of arb instead.

## 2018-06-30 NOTE — Telephone Encounter (Signed)
Called and spoke with the patient regarding MW recommendations about sleep study Pt states she has appt with MW on 10/10/2018 will conduct this further at that time Pt verbalized and expressed understanding Nothing further needed

## 2018-07-04 NOTE — Telephone Encounter (Signed)
appt made

## 2018-07-05 ENCOUNTER — Encounter: Payer: Self-pay | Admitting: Internal Medicine

## 2018-07-06 ENCOUNTER — Ambulatory Visit: Payer: Self-pay | Attending: Primary Care | Admitting: Primary Care

## 2018-07-06 ENCOUNTER — Other Ambulatory Visit: Payer: Self-pay

## 2018-07-06 ENCOUNTER — Encounter: Payer: Self-pay | Admitting: Primary Care

## 2018-07-06 DIAGNOSIS — M545 Low back pain: Secondary | ICD-10-CM

## 2018-07-06 DIAGNOSIS — M62830 Muscle spasm of back: Secondary | ICD-10-CM

## 2018-07-06 DIAGNOSIS — G8929 Other chronic pain: Secondary | ICD-10-CM

## 2018-07-06 MED ORDER — CYCLOBENZAPRINE HCL 10 MG PO TABS: 10.0000 mg | ORAL_TABLET | Freq: Three times a day (TID) | ORAL | 0 refills | Status: DC | PRN

## 2018-07-06 NOTE — Progress Notes (Signed)
Acute Office Visit  Subjective:    Patient ID: Morgan Bean, female    DOB: 1966/02/01, 53 y.o.   MRN: [id number]  Chief Complaint  Patient presents with  . Back Pain    Patient is seen via tele visit due to COVID-19. [name] has been trying to be proactive and started using her treadmill. Her plan was well thought out start gradually than increase. She started at 5 mins than gradually reach 15 mins. The next few days she became stiff back ache , pain bilateral down to knees and muscle spasm.   Past Medical History:  Diagnosis Date  . Anemia of other chronic disease 11/13/2013  . Anginal pain ([place])    none in past year  . Anxiety   . Asthma   . Candidiasis 06/23/2013  . Chronic diastolic CHF (congestive heart failure) ([place])   . Constipation   . Coronary artery disease    Lexiscan myoview (10/12) with significant ST depression upon Lexiscan injection but no ischemia or infarction on perfusion images. Left heart cath (10/12): 30% mid LAD, 30% ostial D1, 50% ostial D2.    [name] Cough 11/13/2013  . DCIS (ductal carcinoma in situ) of breast   . Depression   . Headache 02/22/2014  . Hx of cardiovascular stress test    Lexiscan Myoview (2/16): Breast attenuation artifact, no ischemia, EF 64%; low risk  . Hyperlipemia   . Hypertension   . Iron deficiency    hx of  . Leukocytoclastic vasculitis ([place])    Rash across lower body, occurred in 9/11, diagnosed by skin biopsy. ANA positive. Thought to be secondary to cocaine use.  . Leukopenia 03/21/2013  . Lymphadenopathy 03/21/2013  . Lymphadenopathy 01/30/2014  . Mental disorder   . Pancytopenia ([place])   . Pancytopenia, acquired ([place]) 07/16/2015  . Polysubstance abuse ([place])    Prior cocaine  . Pulmonary HTN ([place])    Echo (9/11) with EF 65%, mild LVH, mild AI, mild MR, moderate to possibly severe TR with PA systolic pressure 52 mmHg. Echo (10/12): severe LV hypertrophy, EF 60-65%, mild MR, mild AI, moderate to severe tricuspid regurgitation,  PA systolic pressure 38 mmHg.  Echo 4/14: moderate LVH, EF 65%, normal wall motion, diastolic dysfunction, mild AI, mild MR  . Shortness of breath    a. PFTs 5/14:  FEV1/FVC 99% predicted; FVC 60% predicted; DLCO mildly reduced, mild restriction, air trapping.;  b.  seen by pulmo (Dr. [name]) 5/14: mainly upper airway symptoms - ACE d/c'd (sx's better)  . Stroke ([place]) 2010ish  . Syphilis 04/25/2013  . Tobacco abuse   . Tricuspid regurgitation   . Unspecified deficiency anemia 03/29/2013    Past Surgical History:  Procedure Laterality Date  . ANKLE SURGERY     right  . BONE MARROW ASPIRATION    . CARDIAC CATHETERIZATION    . CARDIAC CATHETERIZATION N/A 04/23/2015   Procedure: Right Heart Cath;  Surgeon: [name], MD;  Location: [facility];  Service: Cardiovascular;  Laterality: N/A;  . CARDIOVERSION N/A 03/11/2018   Procedure: CARDIOVERSION;  Surgeon: [name], MD;  Location: [facility] ENDOSCOPY;  Service: Cardiovascular;  Laterality: N/A;  . I&D EXTREMITY  08/09/2011   Procedure: IRRIGATION AND DEBRIDEMENT EXTREMITY;  Surgeon: [name], MD;  Location: [facility];  Service: General;  Laterality: Left;  i & D Left axilla abscess  . LYMPH NODE BIOPSY N/A 04/05/2013   Procedure: EXCISIONAL BIOPSY RIGHT SUBMANDIBULAR NODE, NASAL ENDOSCOPIC WITH BIOPSY  NASAL PHARYNX;  Surgeon: [name], MD;  Location: [facility];  Service: ENT;  Laterality: N/A;  . TEE WITHOUT CARDIOVERSION N/A 03/11/2018   Procedure: TRANSESOPHAGEAL ECHOCARDIOGRAM (TEE);  Surgeon: [name], MD;  Location: [facility] ENDOSCOPY;  Service: Cardiovascular;  Laterality: N/A;    Family History  Problem Relation Age of Onset  . Coronary artery disease Mother   . Diabetes Mother   . Diabetes Father   . Hypertension Father   . Coronary artery disease Father   . Heart attack Father   . Breast cancer Maternal Grandmother   . Diabetes Maternal Grandmother   . Heart attack Maternal Grandmother   . Stroke Maternal  Grandmother   . Asthma Grandson     Social History   Socioeconomic History  . Marital status: Single    Spouse name: Not on file  . Number of children: 2  . Years of education: Not on file  . Highest education level: Not on file  Occupational History  . Occupation: unemployed  Social Needs  . Financial resource strain: Not on file  . Food insecurity:    Worry: Not on file    Inability: Not on file  . Transportation needs:    Medical: Not on file    Non-medical: Not on file  Tobacco Use  . Smoking status: Former Smoker    Packs/day: 2.00    Years: 30.00    Pack years: 60.00    Types: Cigarettes    Last attempt to quit: 01/23/2018    Years since quitting: 0.4  . Smokeless tobacco: Never Used  Substance and Sexual Activity  . Alcohol use: No    Alcohol/week: 0.0 standard drinks    Comment: pint of wine 2-3 times a week--ocassional - quit 2012  . Drug use: No    Frequency: 1.0 times per week    Types: "Crack" cocaine    Comment: none since 02/04/14  . Sexual activity: Yes  Lifestyle  . Physical activity:    Days per week: Not on file    Minutes per session: Not on file  . Stress: Not on file  Relationships  . Social connections:    Talks on phone: Not on file    Gets together: Not on file    Attends religious service: Not on file    Active member of club or organization: Not on file    Attends meetings of clubs or organizations: Not on file    Relationship status: Not on file  . Intimate partner violence:    Fear of current or ex partner: Not on file    Emotionally abused: Not on file    Physically abused: Not on file    Forced sexual activity: Not on file  Other Topics Concern  . Not on file  Social History Narrative   Lives with mom, sisters and brothers.    Outpatient Medications Prior to Visit  Medication Sig Dispense Refill  . albuterol (PROVENTIL HFA;VENTOLIN HFA) 108 (90 Base) MCG/ACT inhaler Inhale 2 puffs into the lungs every 6 (six) hours as  needed for wheezing or shortness of breath. 54 Inhaler 3  . amiodarone (PACERONE) 200 MG tablet Take 1 tablet (200 mg total) by mouth daily. 90 tablet 2  . atorvastatin (LIPITOR) 40 MG tablet Take 1 tablet (40 mg total) by mouth at bedtime. 30 tablet 6  . buPROPion (WELLBUTRIN SR) 150 MG 12 hr tablet Take 1 tablet (150 mg total) by mouth 2 (two) times daily. [place]  tablet 2  . citalopram (CELEXA) 40 MG tablet Take 1 tablet (40 mg total) by mouth daily. 30 tablet 2  . cloNIDine (CATAPRES) 0.1 MG tablet Take 1 tablet (0.1 mg total) by mouth 2 (two) times daily. 60 tablet 3  . diltiazem (CARDIZEM CD) 180 MG 24 hr capsule Take 1 capsule (180 mg total) by mouth daily. 60 capsule 2  . furosemide (LASIX) 40 MG tablet Take 1 tablet (40 mg total) by mouth 2 (two) times daily. 90 tablet 3  . lisinopril (PRINIVIL,ZESTRIL) 10 MG tablet Take 1 tablet (10 mg total) by mouth daily. 90 tablet 3  . loratadine (CLARITIN) 10 MG tablet Take 1 tablet (10 mg total) by mouth daily as needed for allergies. 90 tablet 1  . mometasone-formoterol (DULERA) 100-5 MCG/ACT AERO Inhale 2 puffs into the lungs 2 (two) times daily. 13 g 0  . rivaroxaban (XARELTO) 20 MG TABS tablet Take 1 tablet (20 mg total) by mouth daily with supper. 30 tablet 2  . topiramate (TOPAMAX) 25 MG tablet Take 2 tablets (50 mg total) by mouth at bedtime. 60 tablet 2  . traZODone (DESYREL) 150 MG tablet Take 1 tablet (150 mg total) by mouth at bedtime. 30 tablet 2   No facility-administered medications prior to visit.     Allergies  Allergen Reactions  . Ciprofloxacin Itching    Review of Systems  Constitutional: Negative.   Respiratory: Negative.   Cardiovascular: Negative.   Gastrointestinal: Positive for blood in stool and nausea.       Nausea after eating sometimes  colonoscopy reschedule until June  Genitourinary: Negative.   Musculoskeletal: Positive for back pain.  Neurological: Positive for weakness.       Objective:    Physical  Exam  LMP 05/10/2013  Wt Readings from Last 3 Encounters:  06/29/18 183 lb (83 kg)  06/23/18 175 lb (79.4 kg)  05/30/18 174 lb 3.2 oz (79 kg)    Health Maintenance Due  Topic Date Due  . COLONOSCOPY  06/14/2013  . MAMMOGRAM  09/23/2014  . PAP SMEAR-Modifier  09/15/2015    There are no preventive care reminders to display for this patient.   Lab Results  Component Value Date   TSH 2.563 03/09/2018   Lab Results  Component Value Date   WBC 5.5 04/25/2018   HGB 12.0 04/25/2018   HCT 39.9 04/25/2018   MCV 79.3 (L) 04/25/2018   PLT 195 04/25/2018   Lab Results  Component Value Date   NA 140 05/23/2018   K 4.5 05/23/2018   CHLORIDE 111 (H) 03/06/2014   CO2 19 (L) 05/23/2018   GLUCOSE 84 05/23/2018   BUN 21 05/23/2018   CREATININE 1.45 (H) 05/23/2018   BILITOT 0.3 05/23/2018   ALKPHOS 117 05/23/2018   AST 19 05/23/2018   ALT 25 05/23/2018   PROT 8.5 05/23/2018   ALBUMIN 4.3 05/23/2018   CALCIUM 9.4 05/23/2018   ANIONGAP 11 04/25/2018   EGFR 71 (L) 03/06/2014   GFR 82.11 05/02/2013   Lab Results  Component Value Date   CHOL 154 08/21/2015   Lab Results  Component Value Date   HDL 43 (L) 08/21/2015   Lab Results  Component Value Date   LDLCALC 89 08/21/2015   Lab Results  Component Value Date   TRIG 109 08/21/2015   Lab Results  Component Value Date   CHOLHDL 3.6 08/21/2015   Lab Results  Component Value Date   HGBA1C 5.4 08/21/2015  Assessment & Plan:   Problem List Items Addressed This Visit    None    Visit Diagnoses    Chronic bilateral low back pain without sciatica    -  Primary   Relevant Medications   cyclobenzaprine (FLEXERIL) 10 MG tablet   Muscle spasm of back       Relevant Medications   cyclobenzaprine (FLEXERIL) 10 MG tablet    Mana was seen today for back pain.  Diagnoses and all orders for this visit:  Chronic bilateral low back pain without sciatica   This is a new problem. The current episode started in  the past 7 days. The problem occurs intermittently. The problem has been gradually worsening since onset. The pain is present in the lumbar spine. The quality of the pain is described as aching and stabbing. The pain radiates to the right knee. The pain is at a severity of 10/10. Pain severity now 0 only with muscle spasm. The symptoms are aggravated by bending and twisting. Associated symptoms include weakness. Risk factors include menopause and sedentary lifestyle (started excercising). She has tried stretching at home symptoms made s/s worse. The treatment provided no relief.  She may take OTC ibuprofen , heat or ice which ever provides more relief.   Muscle spasm of back Secondary to starting to exercise after sedentary life style. Will tx with flexeril.    Note Patient still has blood in stool but colonoscopy reschedule until the middle of June.   Meds ordered this encounter  Medications  . cyclobenzaprine (FLEXERIL) 10 MG tablet    Sig: Take 1 tablet (10 mg total) by mouth 3 (three) times daily as needed for muscle spasms.    Dispense:  45 tablet    Refill:  0     Kerin Perna, NP

## 2018-07-06 NOTE — Progress Notes (Signed)
Back pain for about 2 wks now, hurts only when she moves,   Patient thinks it's muscle spasms

## 2018-07-07 ENCOUNTER — Telehealth: Payer: Self-pay | Admitting: Internal Medicine

## 2018-07-07 NOTE — Telephone Encounter (Signed)
ONO on RA done by [facility] on 07/04/2018  Per MW- results are normal  Spoke with pt and notified of results per Dr. Melvyn Novas. Pt verbalized understanding and denied any questions.

## 2018-07-14 ENCOUNTER — Telehealth: Payer: Self-pay

## 2018-07-14 NOTE — Telephone Encounter (Signed)
Virtual Visit Pre-Appointment Phone Call [place] PHONE "(Name), I am calling you today to discuss your upcoming appointment. We are currently trying to limit exposure to the virus that causes COVID-19 by seeing patients at home rather than in the office."  1. "What is the BEST phone number to call the day of the visit?" - include this in appointment notes  2. "Do you have or have access to (through a family member/friend) a smartphone with video capability that we can use for your visit?" a. If yes - list this number in appt notes as "cell" (if different from BEST phone #) and list the appointment type as a VIDEO visit in appointment notes b. If no - list the appointment type as a PHONE visit in appointment notes  3. Confirm consent - "In the setting of the current Covid19 crisis, you are scheduled for a (phone or video) visit with your provider on (date) at (time).  Just as we do with many in-office visits, in order for you to participate in this visit, we must obtain consent.  If you'd like, I can send this to your mychart (if signed up) or email for you to review.  Otherwise, I can obtain your verbal consent now.  All virtual visits are billed to your insurance company just like a normal visit would be.  By agreeing to a virtual visit, we'd like you to understand that the technology does not allow for your provider to perform an examination, and thus may limit your provider's ability to fully assess your condition. If your provider identifies any concerns that need to be evaluated in person, we will make arrangements to do so.  Finally, though the technology is pretty good, we cannot assure that it will always work on either your or our end, and in the setting of a video visit, we may have to convert it to a phone-only visit.  In either situation, we cannot ensure that we have a secure connection.  Are you willing to proceed?" STAFF: Did the patient verbally acknowledge consent to  telehealth visit? Document YES/NO here:   4. Advise patient to be prepared - "Two hours prior to your appointment, go ahead and check your blood pressure, pulse, oxygen saturation, and your weight (if you have the equipment to check those) and write them all down. When your visit starts, your provider will ask you for this information. If you have an Apple Watch or Kardia device, please plan to have heart rate information ready on the day of your appointment. Please have a pen and paper handy nearby the day of the visit as well."  5. Give patient instructions for MyChart download to smartphone OR [email or web address] as below if video visit (depending on what platform provider is using)  6. Inform patient they will receive a phone call 15 minutes prior to their appointment time (may be from unknown caller ID) so they should be prepared to answer    TELEPHONE CALL NOTE  Morgan Bean has been deemed a candidate for a follow-up tele-health visit to limit community exposure during the Covid-19 pandemic. I spoke with the patient via phone to ensure availability of phone/video source, confirm preferred email & phone number, and discuss instructions and expectations.  I reminded Morgan Bean to be prepared with any vital sign and/or heart rhythm information that could potentially be obtained via home monitoring, at the time of her visit. I reminded Morgan Bean to expect a phone call prior to  her visit.  [place] 07/14/2018 8:53 AM   INSTRUCTIONS FOR DOWNLOADING THE MYCHART APP TO SMARTPHONE  - The patient must first make sure to have activated MyChart and know their login information - If Apple, go to [employer] and type in MyChart in the search bar and download the app. If Android, ask patient to go to [employer] and type in [place] in the search bar and download the app. The app is free but as with any other app downloads, their phone may require them to verify saved payment information  or Apple/Android password.  - The patient will need to then log into the app with their MyChart username and password, and select [facility] as their healthcare provider to link the account. When it is time for your visit, go to the MyChart app, find appointments, and click Begin Video Visit. Be sure to Select Allow for your device to access the Microphone and Camera for your visit. You will then be connected, and your provider will be with you shortly.  **If they have any issues connecting, or need assistance please contact MyChart service desk [phone])**  **If using a computer, in order to ensure the best quality for their visit they will need to use either of the following Internet Browsers: [employer], or Google Chrome  IF USING DOXIMITY or [email or web address].ME - The patient will receive a link just prior to their visit by text.     FULL LENGTH CONSENT FOR TELE-HEALTH VISIT   I hereby voluntarily request, consent and authorize [facility] and its employed or contracted physicians, physician assistants, nurse practitioners or other licensed health care professionals (the Practitioner), to provide me with telemedicine health care services (the "Services") as deemed necessary by the treating Practitioner. I acknowledge and consent to receive the Services by the Practitioner via telemedicine. I understand that the telemedicine visit will involve communicating with the Practitioner through live audiovisual communication technology and the disclosure of certain medical information by electronic transmission. I acknowledge that I have been given the opportunity to request an in-person assessment or other available alternative prior to the telemedicine visit and am voluntarily participating in the telemedicine visit.  I understand that I have the right to withhold or withdraw my consent to the use of telemedicine in the course of my care at any time, without affecting my right to future  care or treatment, and that the Practitioner or I may terminate the telemedicine visit at any time. I understand that I have the right to inspect all information obtained and/or recorded in the course of the telemedicine visit and may receive copies of available information for a reasonable fee.  I understand that some of the potential risks of receiving the Services via telemedicine include:  [name] Delay or interruption in medical evaluation due to technological equipment failure or disruption; . Information transmitted may not be sufficient (e.g. poor resolution of images) to allow for appropriate medical decision making by the Practitioner; and/or  . In rare instances, security protocols could fail, causing a breach of personal health information.  Furthermore, I acknowledge that it is my responsibility to provide information about my medical history, conditions and care that is complete and accurate to the best of my ability. I acknowledge that Practitioner's advice, recommendations, and/or decision may be based on factors not within their control, such as incomplete or inaccurate data provided by me or distortions of diagnostic images or specimens that may result from electronic transmissions.  I understand that the practice of medicine is not an exact science and that Practitioner makes no warranties or guarantees regarding treatment outcomes. I acknowledge that I will receive a copy of this consent concurrently upon execution via email to the email address I last provided but may also request a printed copy by calling the office of [facility].    I understand that my insurance will be billed for this visit.   I have read or had this consent read to me. . I understand the contents of this consent, which adequately explains the benefits and risks of the Services being provided via telemedicine.  . I have been provided ample opportunity to ask questions regarding this consent and the Services and have  had my questions answered to my satisfaction. . I give my informed consent for the services to be provided through the use of telemedicine in my medical care  By participating in this telemedicine visit I agree to the above.

## 2018-08-01 ENCOUNTER — Encounter: Payer: Self-pay | Admitting: Cardiovascular Disease

## 2018-08-01 NOTE — Progress Notes (Signed)
This encounter was created in error - please disregard.

## 2018-08-22 ENCOUNTER — Encounter: Payer: Self-pay | Admitting: Gastroenterology

## 2018-08-25 NOTE — Telephone Encounter (Signed)
Routed to [facility] scheduling to arrange appointment with Dr. Oval Linsey

## 2018-09-01 ENCOUNTER — Other Ambulatory Visit (INDEPENDENT_AMBULATORY_CARE_PROVIDER_SITE_OTHER): Payer: Self-pay | Admitting: Internal Medicine

## 2018-09-01 DIAGNOSIS — I10 Essential (primary) hypertension: Secondary | ICD-10-CM

## 2018-09-27 ENCOUNTER — Encounter: Payer: Self-pay | Admitting: Gastroenterology

## 2018-10-03 ENCOUNTER — Other Ambulatory Visit: Payer: Self-pay | Admitting: Cardiovascular Disease

## 2018-10-03 DIAGNOSIS — I4819 Other persistent atrial fibrillation: Secondary | ICD-10-CM

## 2018-10-03 MED ORDER — RIVAROXABAN 20 MG PO TABS: 20.0000 mg | ORAL_TABLET | Freq: Every day | ORAL | 0 refills | Status: DC

## 2018-10-03 NOTE — Telephone Encounter (Signed)
Xarelto 20 mg daily refilled. 

## 2018-10-03 NOTE — Telephone Encounter (Signed)
°*  STAT* If patient is at the pharmacy, call can be transferred to refill team.   1. Which medications need to be refilled? (please list name of each medication and dose if known) Xareltro 20 mg 2. Which pharmacy/location (including street and city if local pharmacy) is medication to be sent to? [facility]  3. Do they need a 30 day or 90 day supply? 30 days patient also has up coming appt.

## 2018-10-06 ENCOUNTER — Other Ambulatory Visit: Payer: Self-pay | Admitting: *Deleted

## 2018-10-06 ENCOUNTER — Other Ambulatory Visit (HOSPITAL_COMMUNITY)
Admission: RE | Admit: 2018-10-06 | Discharge: 2018-10-06 | Disposition: A | Discharge status: Home / Self Care | Payer: Medicaid Other | Source: Ambulatory Visit | Attending: Internal Medicine | Admitting: Internal Medicine

## 2018-10-06 ENCOUNTER — Other Ambulatory Visit: Payer: Self-pay | Admitting: Internal Medicine

## 2018-10-06 DIAGNOSIS — Z1159 Encounter for screening for other viral diseases: Secondary | ICD-10-CM | POA: Insufficient documentation

## 2018-10-06 DIAGNOSIS — J449 Chronic obstructive pulmonary disease, unspecified: Secondary | ICD-10-CM

## 2018-10-06 LAB — SARS CORONAVIRUS 2 (TAT 6-24 HRS): SARS Coronavirus 2: NEGATIVE

## 2018-10-10 ENCOUNTER — Ambulatory Visit (INDEPENDENT_AMBULATORY_CARE_PROVIDER_SITE_OTHER): Payer: Medicaid Other | Admitting: Internal Medicine

## 2018-10-10 ENCOUNTER — Other Ambulatory Visit: Payer: Self-pay

## 2018-10-10 ENCOUNTER — Ambulatory Visit: Payer: Self-pay | Admitting: Internal Medicine

## 2018-10-10 ENCOUNTER — Encounter: Payer: Self-pay | Admitting: Internal Medicine

## 2018-10-10 ENCOUNTER — Ambulatory Visit
Admission: RE | Admit: 2018-10-10 | Discharge: 2018-10-10 | Disposition: A | Discharge status: Home / Self Care | Payer: Medicaid Other | Source: Ambulatory Visit | Attending: Internal Medicine | Admitting: Internal Medicine

## 2018-10-10 DIAGNOSIS — Z1239 Encounter for other screening for malignant neoplasm of breast: Secondary | ICD-10-CM

## 2018-10-10 DIAGNOSIS — R0609 Other forms of dyspnea: Secondary | ICD-10-CM

## 2018-10-10 DIAGNOSIS — J449 Chronic obstructive pulmonary disease, unspecified: Secondary | ICD-10-CM

## 2018-10-10 DIAGNOSIS — F17210 Nicotine dependence, cigarettes, uncomplicated: Secondary | ICD-10-CM | POA: Diagnosis not present

## 2018-10-10 LAB — PULMONARY FUNCTION TEST
DL/VA % pred: 105 %
DL/VA: 4.42 ml/min/mmHg/L
DLCO unc % pred: 65 %
DLCO unc: 14.37 ml/min/mmHg
FEF 25-75 Post: 2.26 L/sec
FEF 25-75 Pre: 1.81 L/sec
FEF2575-%Change-Post: 24 %
FEF2575-%Pred-Post: 94 %
FEF2575-%Pred-Pre: 75 %
FEV1-%Change-Post: 5 %
FEV1-%Pred-Post: 73 %
FEV1-%Pred-Pre: 70 %
FEV1-Post: 1.77 L
FEV1-Pre: 1.68 L
FEV1FVC-%Change-Post: 4 %
FEV1FVC-%Pred-Pre: 103 %
FEV6-%Change-Post: 0 %
FEV6-%Pred-Post: 69 %
FEV6-%Pred-Pre: 69 %
FEV6-Post: 2.04 L
FEV6-Pre: 2.02 L
FEV6FVC-%Pred-Post: 103 %
FEV6FVC-%Pred-Pre: 103 %
FVC-%Change-Post: 0 %
FVC-%Pred-Post: 67 %
FVC-%Pred-Pre: 67 %
FVC-Post: 2.04 L
FVC-Pre: 2.02 L
Post FEV1/FVC ratio: 87 %
Post FEV6/FVC ratio: 100 %
Pre FEV1/FVC ratio: 83 %
Pre FEV6/FVC Ratio: 100 %
RV % pred: 81 %
RV: 1.62 L
TLC % pred: 72 %
TLC: 3.89 L

## 2018-10-10 NOTE — Progress Notes (Signed)
Morgan Bean, female    DOB: 27-Sep-1965   MRN: [id number]   Brief patient profile:  13 yobf active smoker  and prev eval by [facility] in pulmonary clinic 2014 with mild restrictive changes and cough attributed to ACEi which apparently resolved p d/c referred to pulmonary clinic 06/29/2018 by Dr   Oval Linsey for abn pfts on amiodarone new start 05/23/2018 for A flutter s/p ablation and carioversion 02/2018     History of Present Illness  06/29/2018  Pulmonary/ 1st office eval/Elai Vanwyk  Chief Complaint  Patient presents with  . Pulmonary Consult    Referred by Dr. [name] for eval of abormal PFT's. She takes Amiodarone. She uses an albuterol 3 x per wk on average.   Dyspnea:  Walks on treadmill x 15 min / "real slow stopping"(does not know the settings)  / flat grade daily x one week/ doe x  back uphill  to house from [place]  is struggle and stops half way getting easier than it was while smoking  Cough: better since quit Morgan Bean maybe an hour p lie down/ mucus is clear / some am cough x 15 min clears it  Sleep: on side / no pillows / flat bed  SABA use: can't really be sure helps at all / same with dulera but hfa quite poor rec Congratulations on stopping smoking  Plan A = Automatic = dulera 100 Take 2 puffs first thing in am and then another 2 puffs about 12 hours later.  Work on Orthoptist B = Backup Only use your albuterol inhaler (proventil)  as a rescue medication Keep up the walking and build up to a minimum of 30 minutes if possible - call if losing ground  Please schedule a follow up visit in 3 months but call sooner if needed  with all medications /inhalers/ solutions in hand so we can verify exactly what you are taking. This includes all medications from all doctors and over the counters  - don't take your inhalers that morning and we will repeat your full pfts.   10/10/2018  f/u ov/Alicen Donalson re: doe on amiodarone  Chief Complaint  Patient presents with  . Follow-up    PFT's  done today. Breathing is doing well and she has not needed her albuterol. +  Dyspnea:   3 days weekly x 15 min s stopping  At 2.0 flat at sats  Cough: none Sleeping: lie flat ok  SABA use: none  02: none    No obvious day to day or daytime variability or assoc excess/ purulent sputum or mucus plugs or hemoptysis or cp or chest tightness, subjective wheeze or overt sinus or hb symptoms.   Sleeping  without nocturnal  or early am exacerbation  of respiratory  c/o's or need for noct saba. Also denies any obvious fluctuation of symptoms with weather or environmental changes or other aggravating or alleviating factors except as outlined above   No unusual exposure hx or h/o childhood pna/ asthma or knowledge of premature birth.  Current Allergies, Complete Past Medical History, Past Surgical History, Family History, and Social History were reviewed in [employer] record.  ROS  The following are not active complaints unless bolded Hoarseness, sore throat, dysphagia, dental problems, itching, sneezing,  nasal congestion or discharge of excess mucus or purulent secretions, ear ache,   fever, chills, sweats, unintended wt loss or wt gain, classically pleuritic or exertional cp,  orthopnea pnd or arm/hand swelling  or leg swelling, presyncope,  palpitations, abdominal pain, anorexia, nausea, vomiting, diarrhea  or change in bowel habits or change in bladder habits, change in stools or change in urine, dysuria, hematuria,  rash, arthralgias, visual complaints, headache, numbness, weakness or ataxia or problems with walking or coordination,  change in mood or  memory.        Current Meds  Medication Sig  . albuterol (PROVENTIL HFA;VENTOLIN HFA) 108 (90 Base) MCG/ACT inhaler Inhale 2 puffs into the lungs every 6 (six) hours as needed for wheezing or shortness of breath.  [name] amiodarone (PACERONE) 200 MG tablet Take 1 tablet (200 mg total) by mouth daily.  [name] atorvastatin (LIPITOR) 40 MG  tablet Take 1 tablet (40 mg total) by mouth at bedtime.  [name] buPROPion (WELLBUTRIN SR) 150 MG 12 hr tablet Take 1 tablet (150 mg total) by mouth 2 (two) times daily.  . citalopram (CELEXA) 40 MG tablet Take 1 tablet (40 mg total) by mouth daily.  . cloNIDine (CATAPRES) 0.1 MG tablet Take 1 tablet (0.1 mg total) by mouth 2 (two) times daily. MUST MAKE APPT FOR FURTHER REFILLS  . cyclobenzaprine (FLEXERIL) 10 MG tablet Take 1 tablet (10 mg total) by mouth 3 (three) times daily as needed for muscle spasms.  [name] diltiazem (CARDIZEM CD) 180 MG 24 hr capsule Take 1 capsule (180 mg total) by mouth daily.  . furosemide (LASIX) 40 MG tablet Take 1 tablet (40 mg total) by mouth 2 (two) times daily.  [name] loratadine (CLARITIN) 10 MG tablet Take 1 tablet (10 mg total) by mouth daily as needed for allergies.  . mometasone-formoterol (DULERA) 100-5 MCG/ACT AERO Inhale 2 puffs into the lungs 2 (two) times daily.  . rivaroxaban (XARELTO) 20 MG TABS tablet Take 1 tablet (20 mg total) by mouth daily with supper.  . topiramate (TOPAMAX) 25 MG tablet Take 2 tablets (50 mg total) by mouth at bedtime.  . traZODone (DESYREL) 150 MG tablet Take 1 tablet (150 mg total) by mouth at bedtime.            Past Medical History:  Diagnosis Date  . Anemia of other chronic disease 11/13/2013  . Anginal pain ([place])    none in past year  . Anxiety   . Asthma   . Candidiasis 06/23/2013  . Chronic diastolic CHF (congestive heart failure) ([place])   . Constipation   . Coronary artery disease    Lexiscan myoview (10/12) with significant ST depression upon Lexiscan injection but no ischemia or infarction on perfusion images. Left heart cath (10/12): 30% mid LAD, 30% ostial D1, 50% ostial D2.    [name] Cough 11/13/2013  . DCIS (ductal carcinoma in situ) of breast   . Depression   . Headache 02/22/2014  . Hx of cardiovascular stress test    Lexiscan Myoview (2/16): Breast attenuation artifact, no ischemia, EF 64%; low risk  . Hyperlipemia   .  Hypertension   . Iron deficiency    hx of  . Leukocytoclastic vasculitis ([place])    Rash across lower body, occurred in 9/11, diagnosed by skin biopsy. ANA positive. Thought to be secondary to cocaine use.  . Leukopenia 03/21/2013  . Lymphadenopathy 03/21/2013  . Lymphadenopathy 01/30/2014  . Mental disorder   . Pancytopenia ([place])   . Pancytopenia, acquired ([place]) 07/16/2015  . Polysubstance abuse ([place])    Prior cocaine  . Pulmonary HTN ([place])    Echo (9/11) with EF 65%, mild LVH, mild AI, mild MR, moderate to possibly severe TR with PA systolic pressure  52 mmHg. Echo (10/12): severe LV hypertrophy, EF 60-65%, mild MR, mild AI, moderate to severe tricuspid regurgitation, PA systolic pressure 38 mmHg.  Echo 4/14: moderate LVH, EF 65%, normal wall motion, diastolic dysfunction, mild AI, mild MR  . Shortness of breath    a. PFTs 5/14:  FEV1/FVC 99% predicted; FVC 60% predicted; DLCO mildly reduced, mild restriction, air trapping.;  b.  seen by pulmo (Dr. [name]) 5/14: mainly upper airway symptoms - ACE d/c'd (sx's better)  . Stroke ([place]) 2010ish  . Syphilis 04/25/2013  . Tobacco abuse   . Tricuspid regurgitation   . Unspecified deficiency anemia 03/29/2013       Objective:       amb bf nad    Wt Readings from Last 3 Encounters:  10/10/18 193 lb (87.5 kg)  06/29/18 183 lb (83 kg)  06/23/18 175 lb (79.4 kg)     Vital signs reviewed - Note on arrival 02 sats  100% on RA  HEENT: nl dentition, turbinates bilaterally, and oropharynx. Nl external ear canals without cough reflex   NECK :  without JVD/Nodes/TM/ nl carotid upstrokes bilaterally   LUNGS: no acc muscle use,  Nl contour chest which is clear to A and P bilaterally without cough on insp or exp maneuvers   CV:  RRR  no s3 or murmur or increase in P2, and no edema   ABD:  soft and nontender with nl inspiratory excursion in the supine position. No bruits or organomegaly appreciated, bowel sounds nl  MS:  Nl gait/ ext warm  without deformities, calf tenderness, cyanosis or clubbing No obvious joint restrictions   SKIN: warm and dry without lesions    NEURO:  alert, approp, nl sensorium with  no motor or cerebellar deficits apparent.              Assessment

## 2018-10-10 NOTE — Assessment & Plan Note (Signed)
Active smoker  - no obst by pfts 05/31/2018 and nl curvature  - 06/29/2018  After extensive coaching inhaler device,  effectiveness =    50% from a baseline of 0  - 10/10/2018 no obst by pfts off all rx    I reviewed the Fletcher curve with the patient that basically indicates  if you quit smoking when your best day FEV1 is still well preserved (as is clearly  the case here)  it is highly unlikely you will progress to severe disease and informed the patient there was  no medication on the market that has proven to alter the curve/ its downward trajectory  or the likelihood of progression of their disease(unlike other chronic medical conditions such as atheroclerosis where we do think we can change the natural hx with risk reducing meds)    Therefore stopping smoking and maintaining abstinence are  the most important aspects of care, not choice of inhalers or for that matter, doctors.   Treatment other than smoking cessation  is entirely directed by severity of symptoms and focused also on reducing exacerbations, not attempting to change the natural history of the disease.    >>> ok to just use saba prn

## 2018-10-10 NOTE — Patient Instructions (Addendum)
Most important aspects of your care are to stop smoking and monitor your 02 level toward the end of exercise and call if there is a downward trend   Please schedule a follow up visit in 6 months but call sooner if needed

## 2018-10-10 NOTE — Assessment & Plan Note (Signed)
Onset 2014 prior eval by [facility] PFT's 07/2012:  FEV1 1.53 (60%), but ratio totally normal at 81.  Mild airtrapping, mild restriction, DLCO 68% Amiodarone new start 05/23/2018 - PFT's  05/31/2018   FEV1 1.77 (76 % ) ratio 0.81  p no % improvement from saba p ? prior to study with DLCO  56 % corrects to 95  % for alv volume and no obstruction on f/v loops - ono 07/04/2018 no desat  - PFT's  10/10/2018  FEV1 1.77 (73 % ) ratio 0.87  p 5 % improvement from saba p nothing prior to study with DLCO  14.37(65 %) corrects to 4.42(105%) for alv volume   - 10/10/2018   Walked RA  2 laps @  approx 282ft each @ fast pace  stopped due to  End of study, sats 98% min sob  Despite recurrent smoking and use of amiodarone and ACEi her symptoms have improved  Patients typically have been on amiodarone for 6-12 months before this complication manifests.  Of note, serial clinical evaluation for symptoms such as cough dyspnea or fevers is  the preferred method of monitoring for pulmonary toxicity because a decrease in DLCO or lung volumes is a nonspecific for toxicity. Pathologically amiodarone pulmonary toxicity may appear as interstitial pneumonitis, eosinophilic pneumonia, organizing pneumonia, pulmonary fibrosis or less commonly as diffuse alveolar hemorrhage, pulmonary nodules or pleural effusions.  Risk factors for pulmonary toxicity include age greater than 58, daily dose greater than equal to 400 mg, a high cumulative dose, or pre-existing lung disease  - she really has no risk factors at this pint and can just keep self monitoring in place on her home treadmill

## 2018-10-10 NOTE — Assessment & Plan Note (Addendum)
Counseled re importance of smoking cessation but did not meet time criteria for separate billing     I had an extended discussion with the patient   reviewing all relevant studies completed to date and  lasting 15 to 20 minutes of a 25 minute visit  which included smoking cessation counseling and directly observing ambulatory 02 saturation study documented in a/p section of  today's  office note.  Each maintenance medication was reviewed in detail including most importantly the difference between maintenance and prns and under what circumstances the prns are to be triggered using an action plan format that is not reflected in the computer generated alphabetically organized AVS.     Please see AVS for specific instructions unique to this visit that I personally wrote and verbalized to the the pt in detail and then reviewed with pt  by my nurse highlighting any changes in therapy recommended at today's visit .

## 2018-10-10 NOTE — Progress Notes (Signed)
Full PFT performed today. °

## 2018-10-12 ENCOUNTER — Other Ambulatory Visit (INDEPENDENT_AMBULATORY_CARE_PROVIDER_SITE_OTHER): Payer: Self-pay | Admitting: Internal Medicine

## 2018-10-12 DIAGNOSIS — I10 Essential (primary) hypertension: Secondary | ICD-10-CM

## 2018-10-13 ENCOUNTER — Telehealth: Payer: Self-pay | Admitting: Cardiology

## 2018-10-13 NOTE — Telephone Encounter (Signed)

## 2018-10-14 ENCOUNTER — Ambulatory Visit (INDEPENDENT_AMBULATORY_CARE_PROVIDER_SITE_OTHER): Payer: Medicaid Other | Admitting: Cardiology

## 2018-10-14 ENCOUNTER — Other Ambulatory Visit: Payer: Self-pay

## 2018-10-14 DIAGNOSIS — J449 Chronic obstructive pulmonary disease, unspecified: Secondary | ICD-10-CM

## 2018-10-14 DIAGNOSIS — I2510 Atherosclerotic heart disease of native coronary artery without angina pectoris: Secondary | ICD-10-CM | POA: Diagnosis not present

## 2018-10-14 DIAGNOSIS — I480 Paroxysmal atrial fibrillation: Secondary | ICD-10-CM

## 2018-10-14 DIAGNOSIS — I10 Essential (primary) hypertension: Secondary | ICD-10-CM | POA: Diagnosis not present

## 2018-10-14 DIAGNOSIS — Z7901 Long term (current) use of anticoagulants: Secondary | ICD-10-CM

## 2018-10-14 DIAGNOSIS — N183 Chronic kidney disease, stage 3 unspecified: Secondary | ICD-10-CM | POA: Insufficient documentation

## 2018-10-14 DIAGNOSIS — F1411 Cocaine abuse, in remission: Secondary | ICD-10-CM

## 2018-10-14 NOTE — Patient Instructions (Signed)
Medication Instructions:  Your physician recommends that you continue on your current medications as directed. Please refer to the Current Medication list given to you today.  If you need a refill on your cardiac medications before your next appointment, please call your pharmacy.   Lab work: NONE ordered at this time of appointment   If you have labs (blood work) drawn today and your tests are completely normal, you will receive your results only by: [name] MyChart Message (if you have MyChart) OR . A paper copy in the mail If you have any lab test that is abnormal or we need to change your treatment, we will call you to review the results.  Testing/Procedures: NONE ordered at this time of appointment, defer all lab work to PCP office  Follow-Up: At [facility], you and your health needs are our priority.  As part of our continuing mission to provide you with exceptional heart care, we have created designated Provider Care Teams.  These Care Teams include your primary Cardiologist (physician) and Advanced Practice Providers (APPs -  Physician Assistants and Nurse Practitioners) who all work together to provide you with the care you need, when you need it. You will need a follow up appointment in 6 months.  Please call our office 2 months in advance to schedule this appointment.  You may see [name], MD or one of the following Advanced Practice Providers on your designated Care Team:   Kerin Ransom, PA-C Roby Lofts, [place] . Sande Rives, PA-C  Any Other Special Instructions Will Be Listed Below (If Applicable).

## 2018-10-14 NOTE — Assessment & Plan Note (Signed)
Non obstructive CAD at cath 2012- Low risk Myoview Sept 2017

## 2018-10-14 NOTE — Assessment & Plan Note (Signed)
Repeat B/P by me 124/82

## 2018-10-14 NOTE — Assessment & Plan Note (Signed)
GFR 40's 

## 2018-10-14 NOTE — Progress Notes (Signed)
Cardiology Office Note:    Date:  10/14/2018   ID:  Morgan Bean, DOB [birth year], MRN [id number]  PCP:  Ladell Pier, MD  Cardiologist:  [name], MD  Electrophysiologist:  None   Referring MD: Ladell Pier, MD   No chief complaint on file.   History of Present Illness:    Morgan Bean is a pleasant 53 y.o. female with a hx of PAF, diastolic heart failure, prior CVA, nonobstructive coronary disease, COPD, valvular heart disease with moderate to severe MR, chronic renal insufficiency stage III, and prior substance abuse.  In December 2019 she had atrial fibrillation and diastolic heart failure in the setting of cocaine abuse.  She underwent TEE cardioversion but was found to be back in atrial fibrillation in February 2020.  Echocardiogram showed preserved LV function with an EF of 50 to 55% with moderate to severe MR which was confirmed by TEE in December 2019.  The patient was placed on amiodarone and Xarelto. She converted to NSR on Amiodarone by 05/30/2018 EKG.  She was concerned about taking Amiodarone and saw Dr Melvyn Novas in consult.   After his evaluation he felt she could continue the Amiodarone.  Symptomatically she denies unusual dyspnea. He has asked her to check her O2 sat after exercise and let him know if this begins dropping.  Otherwise she is doing well from a cardiac standpoint, no palpitations.   Past Medical History:  Diagnosis Date  . Anemia of other chronic disease 11/13/2013  . Anginal pain ([place])    none in past year  . Anxiety   . Asthma   . Candidiasis 06/23/2013  . Chronic diastolic CHF (congestive heart failure) ([place])   . Constipation   . Coronary artery disease    Lexiscan myoview (10/12) with significant ST depression upon Lexiscan injection but no ischemia or infarction on perfusion images. Left heart cath (10/12): 30% mid LAD, 30% ostial D1, 50% ostial D2.    [name] Cough 11/13/2013  . DCIS (ductal carcinoma in situ) of breast   . Depression   .  Headache 02/22/2014  . Hx of cardiovascular stress test    Lexiscan Myoview (2/16): Breast attenuation artifact, no ischemia, EF 64%; low risk  . Hyperlipemia   . Hypertension   . Iron deficiency    hx of  . Leukocytoclastic vasculitis ([place])    Rash across lower body, occurred in 9/11, diagnosed by skin biopsy. ANA positive. Thought to be secondary to cocaine use.  . Leukopenia 03/21/2013  . Lymphadenopathy 03/21/2013  . Lymphadenopathy 01/30/2014  . Mental disorder   . Pancytopenia ([place])   . Pancytopenia, acquired ([place]) 07/16/2015  . Polysubstance abuse ([place])    Prior cocaine  . Pulmonary HTN ([place])    Echo (9/11) with EF 65%, mild LVH, mild AI, mild MR, moderate to possibly severe TR with PA systolic pressure 52 mmHg. Echo (10/12): severe LV hypertrophy, EF 60-65%, mild MR, mild AI, moderate to severe tricuspid regurgitation, PA systolic pressure 38 mmHg.  Echo 4/14: moderate LVH, EF 65%, normal wall motion, diastolic dysfunction, mild AI, mild MR  . Shortness of breath    a. PFTs 5/14:  FEV1/FVC 99% predicted; FVC 60% predicted; DLCO mildly reduced, mild restriction, air trapping.;  b.  seen by pulmo (Dr. [name]) 5/14: mainly upper airway symptoms - ACE d/c'd (sx's better)  . Stroke ([place]) 2010ish  . Syphilis 04/25/2013  . Tobacco abuse   . Tricuspid regurgitation   . Unspecified deficiency anemia 03/29/2013  Past Surgical History:  Procedure Laterality Date  . ANKLE SURGERY     right  . BONE MARROW ASPIRATION    . CARDIAC CATHETERIZATION    . CARDIAC CATHETERIZATION N/A 04/23/2015   Procedure: Right Heart Cath;  Surgeon: [name], MD;  Location: [facility] [facility] LAB;  Service: Cardiovascular;  Laterality: N/A;  . CARDIOVERSION N/A 03/11/2018   Procedure: CARDIOVERSION;  Surgeon: [name], MD;  Location: [facility] ENDOSCOPY;  Service: Cardiovascular;  Laterality: N/A;  . I&D EXTREMITY  08/09/2011   Procedure: IRRIGATION AND DEBRIDEMENT EXTREMITY;  Surgeon: [name], MD;   Location: [place];  Service: General;  Laterality: Left;  i & D Left axilla abscess  . LYMPH NODE BIOPSY N/A 04/05/2013   Procedure: EXCISIONAL BIOPSY RIGHT SUBMANDIBULAR NODE, NASAL ENDOSCOPIC WITH BIOPSY NASAL PHARYNX;  Surgeon: [name], MD;  Location: [place];  Service: ENT;  Laterality: N/A;  . TEE WITHOUT CARDIOVERSION N/A 03/11/2018   Procedure: TRANSESOPHAGEAL ECHOCARDIOGRAM (TEE);  Surgeon: [name], MD;  Location: [facility] ENDOSCOPY;  Service: Cardiovascular;  Laterality: N/A;    Current Medications: Current Meds  Medication Sig  . amiodarone (PACERONE) 200 MG tablet Take 1 tablet (200 mg total) by mouth daily.  [name] atorvastatin (LIPITOR) 40 MG tablet Take 1 tablet (40 mg total) by mouth at bedtime.  [name] buPROPion (WELLBUTRIN SR) 150 MG 12 hr tablet Take 1 tablet (150 mg total) by mouth 2 (two) times daily.  . citalopram (CELEXA) 40 MG tablet Take 1 tablet (40 mg total) by mouth daily.  . cloNIDine (CATAPRES) 0.1 MG tablet Take 1 tablet (0.1 mg total) by mouth 2 (two) times daily. MUST KEEP APPT ON 11/07/18 FOR FURTHER REFILLS  . diltiazem (CARDIZEM CD) 180 MG 24 hr capsule Take 1 capsule (180 mg total) by mouth daily.  . furosemide (LASIX) 40 MG tablet Take 1 tablet (40 mg total) by mouth 2 (two) times daily.  [name] loratadine (CLARITIN) 10 MG tablet Take 1 tablet (10 mg total) by mouth daily as needed for allergies.  . rivaroxaban (XARELTO) 20 MG TABS tablet Take 1 tablet (20 mg total) by mouth daily with supper.  . topiramate (TOPAMAX) 25 MG tablet Take 2 tablets (50 mg total) by mouth at bedtime.  . traZODone (DESYREL) 150 MG tablet Take 1 tablet (150 mg total) by mouth at bedtime.  . [DISCONTINUED] albuterol (PROVENTIL HFA;VENTOLIN HFA) 108 (90 Base) MCG/ACT inhaler Inhale 2 puffs into the lungs every 6 (six) hours as needed for wheezing or shortness of breath.  . [DISCONTINUED] cyclobenzaprine (FLEXERIL) 10 MG tablet Take 1 tablet (10 mg total) by mouth 3 (three) times daily as needed for  muscle spasms.  . [DISCONTINUED] mometasone-formoterol (DULERA) 100-5 MCG/ACT AERO Inhale 2 puffs into the lungs 2 (two) times daily.     Allergies:   Ciprofloxacin   Social History   Socioeconomic History  . Marital status: Single    Spouse name: Not on file  . Number of children: 2  . Years of education: Not on file  . Highest education level: Not on file  Occupational History  . Occupation: unemployed  Social Needs  . Financial resource strain: Not on file  . Food insecurity    Worry: Not on file    Inability: Not on file  . Transportation needs    Medical: Not on file    Non-medical: Not on file  Tobacco Use  . Smoking status: Current Every Day Smoker    Packs/day:  2.00    Years: 30.00    Pack years: 60.00    Types: Cigarettes  . Smokeless tobacco: Never Used  . Tobacco comment: 1/2 ppd 10/10/18  Substance and Sexual Activity  . Alcohol use: No    Alcohol/week: 0.0 standard drinks    Comment: pint of wine 2-3 times a week--ocassional - quit 2012  . Drug use: No    Frequency: 1.0 times per week    Types: "Crack" cocaine    Comment: none since 02/04/14  . Sexual activity: Yes  Lifestyle  . Physical activity    Days per week: Not on file    Minutes per session: Not on file  . Stress: Not on file  Relationships  . Social Herbalist on phone: Not on file    Gets together: Not on file    Attends religious service: Not on file    Active member of club or organization: Not on file    Attends meetings of clubs or organizations: Not on file    Relationship status: Not on file  Other Topics Concern  . Not on file  Social History Narrative   Lives with mom, sisters and brothers.     Family History: The patient's family history includes Asthma in her grandson; Breast cancer in her maternal grandmother; Coronary artery disease in her father and mother; Diabetes in her father, maternal grandmother, and mother; Heart attack in her father and maternal  grandmother; Hypertension in her father; Stroke in her maternal grandmother.  ROS:   Please see the history of present illness.     All other systems reviewed and are negative.  EKGs/Labs/Other Studies Reviewed:    The following studies were reviewed today: Echo Dec 2019  Recent Labs: 03/09/2018: Magnesium 2.2; TSH 2.563 04/25/2018: B Natriuretic Peptide 133.9; Hemoglobin 12.0; Platelets 195 05/23/2018: ALT 25; BUN 21; Creatinine, Ser 1.45; Potassium 4.5; Sodium 140  Recent Lipid Panel    Component Value Date/Time   CHOL 154 08/21/2015 1212   TRIG 109 08/21/2015 1212   HDL 43 (L) 08/21/2015 1212   CHOLHDL 3.6 08/21/2015 1212   VLDL 22 08/21/2015 1212   LDLCALC 89 08/21/2015 1212   LDLDIRECT 142.1 12/25/2010 0846    Physical Exam:    VS:  BP (!) 132/92   Pulse 72   Temp (!) 97 F (36.1 C)   Ht 5\' 6"  (1.676 m)   Wt 194 lb 9.6 oz (88.3 kg)   LMP 05/10/2013   SpO2 99%   BMI 31.41 kg/m     Wt Readings from Last 3 Encounters:  10/14/18 194 lb 9.6 oz (88.3 kg)  10/10/18 193 lb (87.5 kg)  06/29/18 183 lb (83 kg)     GEN:  Well nourished, well developed in no acute distress HEENT: Normal NECK: No JVD; No carotid bruits LYMPHATICS: No lymphadenopathy CARDIAC: RRR, no murmurs, rubs, gallops RESPIRATORY:  Clear to auscultation without rales, wheezing or rhonchi  ABDOMEN: Soft, non-tender, non-distended MUSCULOSKELETAL:  No edema; No deformity  SKIN: Warm and dry NEUROLOGIC:  Alert and oriented x 3 PSYCHIATRIC:  Normal affect   ASSESSMENT:    PAF (paroxysmal atrial fibrillation) ([name]) Converted to NSR with Amiodarone  Chronic anticoagulation CHADS VASC= 3- on Xarelto  Essential hypertension Repeat B/P by me 124/82  CAD (coronary artery disease) Non obstructive CAD at cath 2012- Low risk Myoview Sept 2017  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 05/23/18  Active smoker  - no obst  by pfts 05/31/2018 and nl curvature  - 06/29/2018  After extensive  coaching inhaler device,  effectiveness =    50% from a baseline of 0  - 10/10/2018 no obst by pfts off all rx   History of cocaine abuse [facility]) Positive Dec 2019  CRI (chronic renal insufficiency), stage 3 (moderate) ([name]) GFR-40's  PLAN:    No change in cardiac Rx.  She is to see her PCP in a week or two, will defer labs to Dr Wynetta Emery.  F/U Dr Oval Linsey in 6 months.    Medication Adjustments/Labs and Tests Ordered: Current medicines are reviewed at length with the patient today.  Concerns regarding medicines are outlined above.  No orders of the defined types were placed in this encounter.  No orders of the defined types were placed in this encounter.   Patient Instructions  Medication Instructions:  Your physician recommends that you continue on your current medications as directed. Please refer to the Current Medication list given to you today.  If you need a refill on your cardiac medications before your next appointment, please call your pharmacy.   Lab work: NONE ordered at this time of appointment   If you have labs (blood work) drawn today and your tests are completely normal, you will receive your results only by: [name] MyChart Message (if you have MyChart) OR . A paper copy in the mail If you have any lab test that is abnormal or we need to change your treatment, we will call you to review the results.  Testing/Procedures: NONE ordered at this time of appointment, defer all lab work to PCP office  Follow-Up: At [facility], you and your health needs are our priority.  As part of our continuing mission to provide you with exceptional heart care, we have created designated Provider Care Teams.  These Care Teams include your primary Cardiologist (physician) and Advanced Practice Providers (APPs -  Physician Assistants and Nurse Practitioners) who all work together to provide you with the care you need, when you need it. You will need a follow up appointment in 6 months.   Please call our office 2 months in advance to schedule this appointment.  You may see [name], MD or one of the following Advanced Practice Providers on your designated Care Team:   Kerin Ransom, PA-C Roby Lofts, [place] . Sande Rives, PA-C  Any Other Special Instructions Will Be Listed Below (If Applicable).       Signed, Kerin Ransom, PA-C  10/14/2018 12:21 PM    [facility]

## 2018-10-14 NOTE — Assessment & Plan Note (Signed)
CHADS VASC= 3- on Xarelto

## 2018-10-14 NOTE — Assessment & Plan Note (Signed)
Active smoker  - no obst by pfts 05/31/2018 and nl curvature  - 06/29/2018  After extensive coaching inhaler device,  effectiveness =    50% from a baseline of 0  - 10/10/2018 no obst by pfts off all rx

## 2018-10-14 NOTE — Assessment & Plan Note (Signed)
Converted to NSR with Amiodarone

## 2018-10-14 NOTE — Assessment & Plan Note (Signed)
Positive Dec 2019

## 2018-10-25 ENCOUNTER — Ambulatory Visit: Payer: Self-pay | Admitting: Gastroenterology

## 2018-10-25 NOTE — Progress Notes (Deleted)
[facility] Progress Note  Chief Complaint: ***  Subjective  History: My telemedicine note from 06/22/2018: "This patient has a complex cardiac history described in detail on recent internal medicine and cardiology notes.  She has diastolic heart failure, LV ejection fraction 50 to 55%, but with moderate to severe mitral regurgitation based on December 2019 TEE.  She has coronary disease with nonobstructive lesions, previous nonhemorrhagic CVA, hypertension, COPD, A. fib/flutter on oral anticoagulation, and history of polysubstance abuse.  On 04/25/18 she tested positive for amphetamines, though denied use of those medicines.  She had previous toxicology tests positive for cocaine.   When she followed up with community health and wellness in February of this year, she was offered a fit test colon cancer screening because she has neither insurance nor the [employer] card.  The FIT test came back positive.   Patient reports she has had intermittent painless rectal bleeding with blood either on the stool or the paper over the last couple of months.  She denies chronic abdominal pain, loss of appetite or weight loss. She denies dysphagia, nausea, vomiting or unexpected weight loss."    ***  ROS: Cardiovascular:  no chest pain Respiratory: no dyspnea  The patient's Past Medical, Family and Social History were reviewed and are on file in the EMR.  Objective:  Med list reviewed  Current Outpatient Medications:  .  amiodarone (PACERONE) 200 MG tablet, Take 1 tablet (200 mg total) by mouth daily., Disp: 90 tablet, Rfl: 2 .  atorvastatin (LIPITOR) 40 MG tablet, Take 1 tablet (40 mg total) by mouth at bedtime., Disp: 30 tablet, Rfl: 6 .  buPROPion (WELLBUTRIN SR) 150 MG 12 hr tablet, Take 1 tablet (150 mg total) by mouth 2 (two) times daily., Disp: 60 tablet, Rfl: 2 .  citalopram (CELEXA) 40 MG tablet, Take 1 tablet (40 mg total) by mouth daily., Disp: 30 tablet, Rfl: 2 .   cloNIDine (CATAPRES) 0.1 MG tablet, Take 1 tablet (0.1 mg total) by mouth 2 (two) times daily. MUST KEEP APPT ON 11/07/18 FOR FURTHER REFILLS, Disp: 60 tablet, Rfl: 0 .  diltiazem (CARDIZEM CD) 180 MG 24 hr capsule, Take 1 capsule (180 mg total) by mouth daily., Disp: 60 capsule, Rfl: 2 .  furosemide (LASIX) 40 MG tablet, Take 1 tablet (40 mg total) by mouth 2 (two) times daily., Disp: 90 tablet, Rfl: 3 .  lisinopril (PRINIVIL,ZESTRIL) 10 MG tablet, Take 1 tablet (10 mg total) by mouth daily., Disp: 90 tablet, Rfl: 3 .  loratadine (CLARITIN) 10 MG tablet, Take 1 tablet (10 mg total) by mouth daily as needed for allergies., Disp: 90 tablet, Rfl: 1 .  rivaroxaban (XARELTO) 20 MG TABS tablet, Take 1 tablet (20 mg total) by mouth daily with supper., Disp: 30 tablet, Rfl: 0 .  topiramate (TOPAMAX) 25 MG tablet, Take 2 tablets (50 mg total) by mouth at bedtime., Disp: 60 tablet, Rfl: 2 .  traZODone (DESYREL) 150 MG tablet, Take 1 tablet (150 mg total) by mouth at bedtime., Disp: 30 tablet, Rfl: 2   Vital signs in last 24 hrs: There were no vitals filed for this visit.  Physical Exam  ***  HEENT: sclera anicteric, oral mucosa moist without lesions  Neck: supple, no thyromegaly, JVD or lymphadenopathy  Cardiac: RRR without murmurs, S1S2 heard, no peripheral edema  Pulm: clear to auscultation bilaterally, normal RR and effort noted  Abdomen: soft, *** tenderness, with active bowel sounds. No guarding or palpable hepatosplenomegaly.  Skin; warm and dry, no jaundice or rash  Recent Labs:    Radiologic studies:    @ASSESSMENTPLANBEGIN @ Assessment: No diagnosis found.    Plan:    Total time *** minutes, over half spent face-to-face with patient in counseling and coordination of care.   Nelida Meuse III

## 2018-10-28 ENCOUNTER — Telehealth: Payer: Self-pay | Admitting: Cardiovascular Disease

## 2018-10-28 NOTE — Telephone Encounter (Signed)
   [facility] Pre-operative Risk Assessment    Request for surgical clearance:  1. What type of surgery is being performed? Full dental extraction with alveoloplasty    2. When is this surgery scheduled? TBD   3. What type of clearance is required (medical clearance vs. Pharmacy clearance to hold med vs. Both)? Both  4. Are there any medications that need to be held prior to surgery and how long? Xarelto - does she need Lovenox bridge (question per DDS)   5. Practice name and name of physician performing surgery? [facility] Jodene [facility] - [facility]   6. What is your office phone number [phone]    7.   What is your office fax number [phone]  8.   Anesthesia type (None, local, MAC, general) ? General - to be done at hospital    Morgan Bean 10/28/2018, 3:28 PM  _________________________________________________________________   (provider comments below)

## 2018-10-31 NOTE — Telephone Encounter (Signed)
Pt takes Xarelto for afib with CHADS2VASc score of 5 (sex, CHF, CAD, CVA). Need to clarify how many teeth are being extracted. Does "full dental extraction" just mean 1 tooth? If so, recommend continuing Xarelto. If she's having a few teeth pulled, would only recommend holding Xarelto for a max of 1 day due to history of stroke.

## 2018-10-31 NOTE — Telephone Encounter (Signed)
Please comment on xarelto +/- lovenox bridge.

## 2018-11-01 NOTE — Telephone Encounter (Signed)
Preop Callback - please clarify number of teeth being extracted.

## 2018-11-01 NOTE — Telephone Encounter (Signed)
Spoke with nurse at Dr. Parke Simmers office. She stated the pt will have 19 teeth pulled.

## 2018-11-01 NOTE — Telephone Encounter (Signed)
Left VM

## 2018-11-04 NOTE — Telephone Encounter (Signed)
   Primary Cardiologist: [name], MD  Chart reviewed and patient contacted today by phone as part of pre-operative protocol coverage. Given past medical history and time since last visit, based on ACC/AHA guidelines, Morgan Bean would be at acceptable risk for the planned procedure without further cardiovascular testing.   Based on [facility] guidelines there is no indication for SBE prophylaxis.   Ok to hold Eliquis one day pre op.   I will route this recommendation to the requesting party via Epic fax function and remove from pre-op pool.  Please call with questions.  Kerin Ransom, PA-C 11/04/2018, 2:16 PM

## 2018-11-07 ENCOUNTER — Ambulatory Visit: Payer: Medicaid Other | Attending: Internal Medicine | Admitting: Internal Medicine

## 2018-11-07 ENCOUNTER — Other Ambulatory Visit: Payer: Self-pay

## 2018-11-07 ENCOUNTER — Encounter: Payer: Self-pay | Admitting: Internal Medicine

## 2018-11-07 VITALS — BP 152/102 | HR 75 | Temp 98.7°F | Resp 16 | Ht 66.0 in | Wt 192.0 lb

## 2018-11-07 DIAGNOSIS — Z5321 Procedure and treatment not carried out due to patient leaving prior to being seen by health care provider: Secondary | ICD-10-CM

## 2018-11-07 NOTE — Progress Notes (Signed)
Patient left before being seen.

## 2018-12-16 ENCOUNTER — Emergency Department (HOSPITAL_COMMUNITY): Payer: Medicaid Other

## 2018-12-16 ENCOUNTER — Inpatient Hospital Stay (HOSPITAL_COMMUNITY)
Admission: EM | Admit: 2018-12-16 | Discharge: 2018-12-22 | DRG: 809 | Disposition: A | Discharge status: Home / Self Care | Payer: Medicaid Other | Attending: Internal Medicine | Admitting: Internal Medicine

## 2018-12-16 ENCOUNTER — Encounter (HOSPITAL_COMMUNITY): Payer: Self-pay | Admitting: Emergency Medicine

## 2018-12-16 DIAGNOSIS — Z823 Family history of stroke: Secondary | ICD-10-CM

## 2018-12-16 DIAGNOSIS — D509 Iron deficiency anemia, unspecified: Secondary | ICD-10-CM | POA: Diagnosis present

## 2018-12-16 DIAGNOSIS — R591 Generalized enlarged lymph nodes: Secondary | ICD-10-CM | POA: Diagnosis present

## 2018-12-16 DIAGNOSIS — R5081 Fever presenting with conditions classified elsewhere: Secondary | ICD-10-CM | POA: Diagnosis not present

## 2018-12-16 DIAGNOSIS — J449 Chronic obstructive pulmonary disease, unspecified: Secondary | ICD-10-CM | POA: Diagnosis present

## 2018-12-16 DIAGNOSIS — Z881 Allergy status to other antibiotic agents status: Secondary | ICD-10-CM | POA: Diagnosis not present

## 2018-12-16 DIAGNOSIS — A419 Sepsis, unspecified organism: Secondary | ICD-10-CM

## 2018-12-16 DIAGNOSIS — F329 Major depressive disorder, single episode, unspecified: Secondary | ICD-10-CM | POA: Diagnosis present

## 2018-12-16 DIAGNOSIS — I480 Paroxysmal atrial fibrillation: Secondary | ICD-10-CM | POA: Diagnosis present

## 2018-12-16 DIAGNOSIS — F419 Anxiety disorder, unspecified: Secondary | ICD-10-CM | POA: Diagnosis present

## 2018-12-16 DIAGNOSIS — I5032 Chronic diastolic (congestive) heart failure: Secondary | ICD-10-CM | POA: Diagnosis not present

## 2018-12-16 DIAGNOSIS — R0602 Shortness of breath: Secondary | ICD-10-CM | POA: Diagnosis not present

## 2018-12-16 DIAGNOSIS — Z7901 Long term (current) use of anticoagulants: Secondary | ICD-10-CM

## 2018-12-16 DIAGNOSIS — I5033 Acute on chronic diastolic (congestive) heart failure: Secondary | ICD-10-CM | POA: Diagnosis present

## 2018-12-16 DIAGNOSIS — I959 Hypotension, unspecified: Secondary | ICD-10-CM | POA: Diagnosis not present

## 2018-12-16 DIAGNOSIS — J0510 Acute epiglottitis without obstruction: Secondary | ICD-10-CM | POA: Diagnosis present

## 2018-12-16 DIAGNOSIS — R490 Dysphonia: Secondary | ICD-10-CM | POA: Diagnosis present

## 2018-12-16 DIAGNOSIS — I130 Hypertensive heart and chronic kidney disease with heart failure and stage 1 through stage 4 chronic kidney disease, or unspecified chronic kidney disease: Secondary | ICD-10-CM | POA: Diagnosis not present

## 2018-12-16 DIAGNOSIS — R3129 Other microscopic hematuria: Secondary | ICD-10-CM | POA: Diagnosis not present

## 2018-12-16 DIAGNOSIS — N183 Chronic kidney disease, stage 3 unspecified: Secondary | ICD-10-CM | POA: Diagnosis present

## 2018-12-16 DIAGNOSIS — I10 Essential (primary) hypertension: Secondary | ICD-10-CM | POA: Diagnosis present

## 2018-12-16 DIAGNOSIS — I272 Pulmonary hypertension, unspecified: Secondary | ICD-10-CM | POA: Diagnosis present

## 2018-12-16 DIAGNOSIS — I2510 Atherosclerotic heart disease of native coronary artery without angina pectoris: Secondary | ICD-10-CM | POA: Diagnosis not present

## 2018-12-16 DIAGNOSIS — M542 Cervicalgia: Secondary | ICD-10-CM | POA: Diagnosis present

## 2018-12-16 DIAGNOSIS — N2889 Other specified disorders of kidney and ureter: Secondary | ICD-10-CM | POA: Diagnosis present

## 2018-12-16 DIAGNOSIS — R351 Nocturia: Secondary | ICD-10-CM | POA: Diagnosis not present

## 2018-12-16 DIAGNOSIS — F1410 Cocaine abuse, uncomplicated: Secondary | ICD-10-CM | POA: Diagnosis present

## 2018-12-16 DIAGNOSIS — Z20828 Contact with and (suspected) exposure to other viral communicable diseases: Secondary | ICD-10-CM | POA: Diagnosis present

## 2018-12-16 DIAGNOSIS — Z79899 Other long term (current) drug therapy: Secondary | ICD-10-CM

## 2018-12-16 DIAGNOSIS — Z833 Family history of diabetes mellitus: Secondary | ICD-10-CM

## 2018-12-16 DIAGNOSIS — E785 Hyperlipidemia, unspecified: Secondary | ICD-10-CM | POA: Diagnosis present

## 2018-12-16 DIAGNOSIS — Z86000 Personal history of in-situ neoplasm of breast: Secondary | ICD-10-CM

## 2018-12-16 DIAGNOSIS — F17210 Nicotine dependence, cigarettes, uncomplicated: Secondary | ICD-10-CM | POA: Diagnosis present

## 2018-12-16 DIAGNOSIS — F1411 Cocaine abuse, in remission: Secondary | ICD-10-CM | POA: Diagnosis present

## 2018-12-16 DIAGNOSIS — R768 Other specified abnormal immunological findings in serum: Secondary | ICD-10-CM | POA: Diagnosis present

## 2018-12-16 DIAGNOSIS — Z803 Family history of malignant neoplasm of breast: Secondary | ICD-10-CM

## 2018-12-16 DIAGNOSIS — D704 Cyclic neutropenia: Secondary | ICD-10-CM | POA: Diagnosis present

## 2018-12-16 DIAGNOSIS — D61818 Other pancytopenia: Secondary | ICD-10-CM | POA: Diagnosis present

## 2018-12-16 DIAGNOSIS — Z8249 Family history of ischemic heart disease and other diseases of the circulatory system: Secondary | ICD-10-CM

## 2018-12-16 DIAGNOSIS — I25119 Atherosclerotic heart disease of native coronary artery with unspecified angina pectoris: Secondary | ICD-10-CM | POA: Diagnosis not present

## 2018-12-16 DIAGNOSIS — J0430 Supraglottitis, unspecified, without obstruction: Secondary | ICD-10-CM | POA: Diagnosis not present

## 2018-12-16 DIAGNOSIS — D709 Neutropenia, unspecified: Secondary | ICD-10-CM | POA: Diagnosis present

## 2018-12-16 DIAGNOSIS — D638 Anemia in other chronic diseases classified elsewhere: Secondary | ICD-10-CM | POA: Diagnosis present

## 2018-12-16 DIAGNOSIS — Z8673 Personal history of transient ischemic attack (TIA), and cerebral infarction without residual deficits: Secondary | ICD-10-CM | POA: Diagnosis not present

## 2018-12-16 DIAGNOSIS — Z825 Family history of asthma and other chronic lower respiratory diseases: Secondary | ICD-10-CM

## 2018-12-16 DIAGNOSIS — I5030 Unspecified diastolic (congestive) heart failure: Secondary | ICD-10-CM | POA: Diagnosis not present

## 2018-12-16 DIAGNOSIS — F1490 Cocaine use, unspecified, uncomplicated: Secondary | ICD-10-CM | POA: Diagnosis not present

## 2018-12-16 LAB — URINALYSIS, ROUTINE W REFLEX MICROSCOPIC
Bacteria, UA: NONE SEEN
Bilirubin Urine: NEGATIVE
Glucose, UA: NEGATIVE mg/dL
Ketones, ur: 5 mg/dL — AB
Leukocytes,Ua: NEGATIVE
Nitrite: NEGATIVE
Protein, ur: 100 mg/dL — AB
Specific Gravity, Urine: 1.024 (ref 1.005–1.030)
pH: 5 (ref 5.0–8.0)

## 2018-12-16 LAB — COMPREHENSIVE METABOLIC PANEL
ALT: 52 U/L — ABNORMAL HIGH (ref 0–44)
AST: 76 U/L — ABNORMAL HIGH (ref 15–41)
Albumin: 3.4 g/dL — ABNORMAL LOW (ref 3.5–5.0)
Alkaline Phosphatase: 75 U/L (ref 38–126)
Anion gap: 13 (ref 5–15)
BUN: 14 mg/dL (ref 6–20)
CO2: 22 mmol/L (ref 22–32)
Calcium: 8.8 mg/dL — ABNORMAL LOW (ref 8.9–10.3)
Chloride: 100 mmol/L (ref 98–111)
Creatinine, Ser: 1.44 mg/dL — ABNORMAL HIGH (ref 0.44–1.00)
GFR calc Af Amer: 47 mL/min — ABNORMAL LOW (ref >60)
GFR calc non Af Amer: 41 mL/min — ABNORMAL LOW (ref >60)
Glucose, Bld: 129 mg/dL — ABNORMAL HIGH (ref 70–99)
Potassium: 3.6 mmol/L (ref 3.5–5.1)
Sodium: 135 mmol/L (ref 135–145)
Total Bilirubin: 1.9 mg/dL — ABNORMAL HIGH (ref 0.3–1.2)
Total Protein: 7.8 g/dL (ref 6.5–8.1)

## 2018-12-16 LAB — CBC WITH DIFFERENTIAL/PLATELET
Abs Immature Granulocytes: 0 10*3/uL (ref 0.00–0.07)
Basophils Absolute: 0 10*3/uL (ref 0.0–0.1)
Basophils Relative: 1 %
Eosinophils Absolute: 0 10*3/uL (ref 0.0–0.5)
Eosinophils Relative: 0 %
HCT: 35.7 % — ABNORMAL LOW (ref 36.0–46.0)
Hemoglobin: 10.9 g/dL — ABNORMAL LOW (ref 12.0–15.0)
Lymphocytes Relative: 92 %
Lymphs Abs: 1.4 10*3/uL (ref 0.7–4.0)
MCH: 25.9 pg — ABNORMAL LOW (ref 26.0–34.0)
MCHC: 30.5 g/dL (ref 30.0–36.0)
MCV: 84.8 fL (ref 80.0–100.0)
Monocytes Absolute: 0.1 10*3/uL (ref 0.1–1.0)
Monocytes Relative: 7 %
Neutro Abs: <0.1 10*3/uL — ABNORMAL LOW (ref 1.7–7.7)
Neutrophils Relative %: 0 %
Platelets: 173 10*3/uL (ref 150–400)
RBC: 4.21 MIL/uL (ref 3.87–5.11)
RDW: 15.7 % — ABNORMAL HIGH (ref 11.5–15.5)
WBC: 1.5 10*3/uL — ABNORMAL LOW (ref 4.0–10.5)
nRBC: 0 % (ref 0.0–0.2)

## 2018-12-16 LAB — PROTIME-INR
INR: 1.8 — ABNORMAL HIGH (ref 0.8–1.2)
Prothrombin Time: 20.8 seconds — ABNORMAL HIGH (ref 11.4–15.2)

## 2018-12-16 LAB — LACTIC ACID, PLASMA: Lactic Acid, Venous: 1 mmol/L (ref 0.5–1.9)

## 2018-12-16 LAB — RAPID URINE DRUG SCREEN, HOSP PERFORMED
Amphetamines: NOT DETECTED
Barbiturates: NOT DETECTED
Benzodiazepines: NOT DETECTED
Cocaine: POSITIVE — AB
Opiates: NOT DETECTED
Tetrahydrocannabinol: NOT DETECTED

## 2018-12-16 LAB — GROUP A STREP BY PCR: Group A Strep by PCR: NOT DETECTED

## 2018-12-16 LAB — TROPONIN I (HIGH SENSITIVITY)
Troponin I (High Sensitivity): 23 ng/L — ABNORMAL HIGH (ref <18)
Troponin I (High Sensitivity): 22 ng/L — ABNORMAL HIGH (ref <18)

## 2018-12-16 LAB — APTT: aPTT: 43 seconds — ABNORMAL HIGH (ref 24–36)

## 2018-12-16 LAB — I-STAT BETA HCG BLOOD, ED (MC, WL, AP ONLY): I-stat hCG, quantitative: <5 m[IU]/mL (ref <5)

## 2018-12-16 LAB — SARS CORONAVIRUS 2 BY RT PCR (HOSPITAL ORDER, PERFORMED IN ~~LOC~~ HOSPITAL LAB)
SARS Coronavirus 2: NEGATIVE
SARS Coronavirus 2: NEGATIVE

## 2018-12-16 MED ORDER — ACETAMINOPHEN 500 MG PO TABS
1000.0000 mg | ORAL_TABLET | Freq: Once | ORAL | Status: AC
  Administered 2018-12-16: 1000 mg via ORAL
  Filled 2018-12-16: qty 2

## 2018-12-16 MED ORDER — TOPIRAMATE 25 MG PO TABS
50.0000 mg | ORAL_TABLET | Freq: Every day | ORAL | Status: DC
  Administered 2018-12-17 – 2018-12-21 (×6): 50 mg via ORAL
  Filled 2018-12-16 (×6): qty 2

## 2018-12-16 MED ORDER — RIVAROXABAN 20 MG PO TABS
20.0000 mg | ORAL_TABLET | Freq: Every day | ORAL | Status: DC
  Administered 2018-12-17 – 2018-12-22 (×6): 20 mg via ORAL
  Filled 2018-12-16 (×6): qty 1

## 2018-12-16 MED ORDER — ATORVASTATIN CALCIUM 40 MG PO TABS
40.0000 mg | ORAL_TABLET | Freq: Every day | ORAL | Status: DC
  Administered 2018-12-17 – 2018-12-21 (×6): 40 mg via ORAL
  Filled 2018-12-16 (×6): qty 1

## 2018-12-16 MED ORDER — DILTIAZEM HCL ER COATED BEADS 180 MG PO CP24
180.0000 mg | ORAL_CAPSULE | Freq: Every day | ORAL | Status: DC
  Administered 2018-12-17 – 2018-12-18 (×2): 180 mg via ORAL
  Filled 2018-12-16 (×3): qty 1

## 2018-12-16 MED ORDER — TRAZODONE HCL 50 MG PO TABS
150.0000 mg | ORAL_TABLET | Freq: Every day | ORAL | Status: DC
  Administered 2018-12-17 – 2018-12-21 (×6): 150 mg via ORAL
  Filled 2018-12-16 (×6): qty 3

## 2018-12-16 MED ORDER — SODIUM CHLORIDE 0.9 % IV SOLN
2.0000 g | Freq: Once | INTRAVENOUS | Status: AC
  Administered 2018-12-16: 2 g via INTRAVENOUS
  Filled 2018-12-16: qty 2

## 2018-12-16 MED ORDER — BUPROPION HCL ER (SR) 150 MG PO TB12
150.0000 mg | ORAL_TABLET | Freq: Two times a day (BID) | ORAL | Status: DC
  Administered 2018-12-17 – 2018-12-22 (×12): 150 mg via ORAL
  Filled 2018-12-16 (×15): qty 1

## 2018-12-16 MED ORDER — ACETAMINOPHEN 325 MG PO TABS
650.0000 mg | ORAL_TABLET | Freq: Once | ORAL | Status: AC
  Administered 2018-12-16: 650 mg via ORAL
  Filled 2018-12-16: qty 2

## 2018-12-16 MED ORDER — CLONIDINE HCL 0.1 MG PO TABS
0.1000 mg | ORAL_TABLET | Freq: Two times a day (BID) | ORAL | Status: DC
  Administered 2018-12-17 – 2018-12-18 (×4): 0.1 mg via ORAL
  Filled 2018-12-16 (×6): qty 1

## 2018-12-16 MED ORDER — VANCOMYCIN HCL 10 G IV SOLR
1750.0000 mg | INTRAVENOUS | Status: DC
  Administered 2018-12-18 – 2018-12-20 (×2): 1750 mg via INTRAVENOUS
  Filled 2018-12-16 (×2): qty 1750

## 2018-12-16 MED ORDER — LACTATED RINGERS IV SOLN
INTRAVENOUS | Status: AC
  Administered 2018-12-17: 01:00:00 via INTRAVENOUS

## 2018-12-16 MED ORDER — SODIUM CHLORIDE 0.9 % IV BOLUS
1000.0000 mL | Freq: Once | INTRAVENOUS | Status: AC
  Administered 2018-12-16: 1000 mL via INTRAVENOUS

## 2018-12-16 MED ORDER — ACETAMINOPHEN 650 MG RE SUPP: 650.0000 mg | Freq: Four times a day (QID) | RECTAL | Status: DC | PRN

## 2018-12-16 MED ORDER — VANCOMYCIN HCL 10 G IV SOLR
1750.0000 mg | Freq: Once | INTRAVENOUS | Status: AC
  Administered 2018-12-16: 1750 mg via INTRAVENOUS
  Filled 2018-12-16 (×2): qty 1750

## 2018-12-16 MED ORDER — LORATADINE 10 MG PO TABS: 10.0000 mg | ORAL_TABLET | Freq: Every day | ORAL | Status: DC | PRN

## 2018-12-16 MED ORDER — CITALOPRAM HYDROBROMIDE 40 MG PO TABS
40.0000 mg | ORAL_TABLET | Freq: Every day | ORAL | Status: DC
  Administered 2018-12-17 – 2018-12-22 (×6): 40 mg via ORAL
  Filled 2018-12-16 (×5): qty 1
  Filled 2018-12-16: qty 4

## 2018-12-16 MED ORDER — AMIODARONE HCL 200 MG PO TABS
200.0000 mg | ORAL_TABLET | Freq: Every day | ORAL | Status: DC
  Administered 2018-12-17 – 2018-12-22 (×5): 200 mg via ORAL
  Filled 2018-12-16 (×6): qty 1

## 2018-12-16 MED ORDER — ONDANSETRON HCL 4 MG PO TABS: 4.0000 mg | ORAL_TABLET | Freq: Four times a day (QID) | ORAL | Status: DC | PRN

## 2018-12-16 MED ORDER — SODIUM CHLORIDE 0.9 % IV SOLN
2.0000 g | Freq: Two times a day (BID) | INTRAVENOUS | Status: DC
  Administered 2018-12-17 (×2): 2 g via INTRAVENOUS
  Filled 2018-12-16 (×2): qty 2

## 2018-12-16 MED ORDER — IOHEXOL 300 MG/ML  SOLN
75.0000 mL | Freq: Once | INTRAMUSCULAR | Status: AC | PRN
  Administered 2018-12-16: 23:00:00 75 mL via INTRAVENOUS

## 2018-12-16 MED ORDER — ACETAMINOPHEN 325 MG PO TABS: 650.0000 mg | ORAL_TABLET | Freq: Four times a day (QID) | ORAL | Status: DC | PRN

## 2018-12-16 MED ORDER — ONDANSETRON HCL 4 MG/2ML IJ SOLN
4.0000 mg | Freq: Once | INTRAMUSCULAR | Status: AC
  Administered 2018-12-16: 4 mg via INTRAVENOUS
  Filled 2018-12-16: qty 2

## 2018-12-16 MED ORDER — ONDANSETRON HCL 4 MG/2ML IJ SOLN: 4.0000 mg | Freq: Four times a day (QID) | INTRAMUSCULAR | Status: DC | PRN

## 2018-12-16 MED ORDER — ONDANSETRON HCL 4 MG/2ML IJ SOLN: 4.0000 mg | Freq: Once | INTRAMUSCULAR | Status: DC

## 2018-12-16 NOTE — ED Notes (Addendum)
Pt says she admits to doing crack cocaine. Provider notified

## 2018-12-16 NOTE — ED Triage Notes (Signed)
C/o shortness of breath, sore throat and fever that started yesterday- has not been around anyone with covid that she is aware of.

## 2018-12-16 NOTE — ED Provider Notes (Signed)
Medical screening examination/treatment/procedure(s) were conducted as a shared visit with non-physician practitioner(s) and myself.  I personally evaluated the patient during the encounter.  Clinical Impression:   Final diagnoses:  Neutropenic fever ([place])  Sepsis without acute organ dysfunction, due to unspecified organism [facility])    This 53 year old female with multiple comorbidities presents with a complaint of fever shortness of breath coughing sore throat started yesterday.  She has a fever up to 103 and lab work which suggest that she has a severe neutrophilia, her absolute neutrophil count is virtually 0, 1500 white blood cells likely lymphocytes.  The patient will need to be admitted to the hospital for febrile neutropenia, rule out coronavirus, sepsis order set, antibiotics, fluids, patient is critically ill with severe febrile neutropenia.  The patient does not have an obvious source of infection, strep negative, COVID negative, urinalysis without obvious signs of infection however she does have some ketones.  She has had multiple bolus of IV fluid, multiple broad-spectrum antibiotics, discussed care with the admitting team, they will come to admit her to the hospital.  .Critical Care Performed by: Noemi Chapel, MD Authorized by: Noemi Chapel, MD   Critical care provider statement:    Critical care time (minutes):  35   Critical care time was exclusive of:  Separately billable procedures and treating other patients and teaching time   Critical care was necessary to treat or prevent imminent or life-threatening deterioration of the following conditions:  Sepsis   Critical care was time spent personally by me on the following activities:  Blood draw for specimens, development of treatment plan with patient or surrogate, discussions with consultants, evaluation of patient's response to treatment, examination of patient, obtaining history from patient or surrogate, ordering and  performing treatments and interventions, ordering and review of laboratory studies, ordering and review of radiographic studies, pulse oximetry, re-evaluation of patient's condition and review of old charts      Noemi Chapel, MD 12/16/18 2147

## 2018-12-16 NOTE — Progress Notes (Signed)
Pharmacy Antibiotic Note  Morgan Bean is a 53 y.o. female admitted on 12/16/2018 with febrile neutropenia.  PMH significant for pancytopenia. Febrile upon ED arrival, WBC 1.5, ANC <0.1, Scr 1.44. Pharmacy has been consulted for vancomycin dosing.  Received Cefepime 2 g IV once in the ED  Plan: Vancomycin 1750 mg IV 1x then 1750 mg q48h (estAUC 519, goal AUC 400-550, Scr used 1.44) Monitor clinical improvement and renal function Monitor C/s  Height: 5\' 6"  (167.6 cm) Weight: 192 lb (87.1 kg) IBW/kg (Calculated) : 59.3  Temp (24hrs), Avg:101.3 F (38.5 C), Min:99.5 F (37.5 C), Max:103.1 F (39.5 C)  Recent Labs  Lab 12/16/18 1335  WBC 1.5*  CREATININE 1.44*  LATICACIDVEN 1.0    Estimated Creatinine Clearance: 49.1 mL/min (A) (by C-G formula based on SCr of 1.44 mg/dL (H)).    Allergies  Allergen Reactions  . Ciprofloxacin Itching    Antimicrobials this admission: Cefepime 9/25 >> Vancomycin 9/25 >>   Microbiology results: 9/25 BCx: pending 9/25 UCx: pending 9/25 COVID: pending   Thank you for allowing pharmacy to be a part of this patient's care.   Lorel Monaco, PharmD PGY1 Ambulatory Care Resident [employer] # [phone]

## 2018-12-16 NOTE — Progress Notes (Signed)
Pharmacy Antibiotic Note  Morgan Bean is a 53 y.o. female admitted on 12/16/2018 with febrile neutropenia.  PMH significant for pancytopenia. Febrile upon ED arrival, WBC 1.5, ANC <0.1, Scr 1.44. Pharmacy has been consulted for vancomycin dosing.  Received Cefepime 2 g IV once in the ED  9/25 PM update:  Adding schedule Cefepime  Plan: Already on vancomycin  Add Cefepime 2g IV q12h  Height: 5\' 6"  (167.6 cm) Weight: 192 lb (87.1 kg) IBW/kg (Calculated) : 59.3  Temp (24hrs), Avg:101.3 F (38.5 C), Min:99.5 F (37.5 C), Max:103.1 F (39.5 C)  Recent Labs  Lab 12/16/18 1335  WBC 1.5*  CREATININE 1.44*  LATICACIDVEN 1.0    Estimated Creatinine Clearance: 49.1 mL/min (A) (by C-G formula based on SCr of 1.44 mg/dL (H)).    Allergies  Allergen Reactions  . Ciprofloxacin Itching    Antimicrobials this admission: Cefepime 9/25 >> Vancomycin 9/25 >>  Narda Bonds, PharmD, BCPS Clinical Pharmacist Phone: [phone]

## 2018-12-16 NOTE — ED Provider Notes (Signed)
[facility] EMERGENCY DEPARTMENT Provider Note   CSN: [id number] Arrival date & time: 12/16/18  1304     History   Chief Complaint Chief Complaint  Patient presents with  . Fever  . Sore Throat  . covid symptoms    HPI Morgan Bean is a 53 y.o. female with PMH significant for HTN, HLD, CVA, CAD, COPD, CHF, and pancytopenia who presents to the ED for 1 day history of shortness of breath, sore throat, and fever.  She also endorses nausea, 5 out of 10 squeezing chest pain, cough, and headache.  Her chest pain does not radiate.  She states that her symptoms began yesterday around 11 AM and have gotten progressively worse, with a fever starting today.  She has not taken anything for her symptoms.  She states that the last time she had pancytopenia was over 5 years ago, but she does not recall what specifically precipitated her condition. She denies any dizziness, neurologic deficits, weakness, vomiting, change in bowel habits, urinary symptoms, or abdominal pain.  No obvious sick contacts. Social history significant for occasional crack cocaine.      HPI  Past Medical History:  Diagnosis Date  . Anemia of other chronic disease 11/13/2013  . Anginal pain ([place])    none in past year  . Anxiety   . Asthma   . Candidiasis 06/23/2013  . Chronic diastolic CHF (congestive heart failure) ([place])   . Constipation   . Coronary artery disease    Lexiscan myoview (10/12) with significant ST depression upon Lexiscan injection but no ischemia or infarction on perfusion images. Left heart cath (10/12): 30% mid LAD, 30% ostial D1, 50% ostial D2.    [name] Cough 11/13/2013  . DCIS (ductal carcinoma in situ) of breast   . Depression   . Headache 02/22/2014  . Hx of cardiovascular stress test    Lexiscan Myoview (2/16): Breast attenuation artifact, no ischemia, EF 64%; low risk  . Hyperlipemia   . Hypertension   . Iron deficiency    hx of  . Leukocytoclastic vasculitis ([place])    Rash  across lower body, occurred in 9/11, diagnosed by skin biopsy. ANA positive. Thought to be secondary to cocaine use.  . Leukopenia 03/21/2013  . Lymphadenopathy 03/21/2013  . Lymphadenopathy 01/30/2014  . Mental disorder   . Pancytopenia ([place])   . Pancytopenia, acquired ([place]) 07/16/2015  . Polysubstance abuse ([place])    Prior cocaine  . Pulmonary HTN ([place])    Echo (9/11) with EF 65%, mild LVH, mild AI, mild MR, moderate to possibly severe TR with PA systolic pressure 52 mmHg. Echo (10/12): severe LV hypertrophy, EF 60-65%, mild MR, mild AI, moderate to severe tricuspid regurgitation, PA systolic pressure 38 mmHg.  Echo 4/14: moderate LVH, EF 65%, normal wall motion, diastolic dysfunction, mild AI, mild MR  . Shortness of breath    a. PFTs 5/14:  FEV1/FVC 99% predicted; FVC 60% predicted; DLCO mildly reduced, mild restriction, air trapping.;  b.  seen by pulmo (Dr. [name]) 5/14: mainly upper airway symptoms - ACE d/c'd (sx's better)  . Stroke ([place]) 2010ish  . Syphilis 04/25/2013  . Tobacco abuse   . Tricuspid regurgitation   . Unspecified deficiency anemia 03/29/2013    Patient Active Problem List   Diagnosis Date Noted  . Chronic anticoagulation 10/14/2018  . CRI (chronic renal insufficiency), stage 3 (moderate) ([place]) 10/14/2018  . Upper airway cough syndrome 06/30/2018  . Tobacco dependence in remission 04/11/2018  . Substance abuse  in remission ([place]) 04/11/2018  . Depression, recurrent ([place]) 04/11/2018  . PAF (paroxysmal atrial fibrillation) ([place]) 03/10/2018  . Acute pulmonary edema ([name])   . Atrial fibrillation with RVR ([place])   . Nonrheumatic mitral valve regurgitation   . Acute on chronic diastolic CHF (congestive heart failure) ([place]) 03/08/2018  . Hypertensive urgency 03/08/2018  . Acute respiratory failure with hypoxia ([place]) 03/08/2018  . Pancytopenia, acquired ([place]) 07/16/2015  . Head and neck lymphadenopathy 07/16/2015  . COPD with chronic bronchitis ([name])n with GOLD 0  spirometry 05/23/18  05/08/2015  . Absolute anemia 02/25/2015  . Preventive measure 12/27/2014  . 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 History of stroke 12/26/2014  . 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 Headache 02/22/2014  . Dental caries 01/09/2014  . Cough 11/13/2013  . Cigarette smoker 11/13/2013  . Swelling of gums 11/13/2013  . Fatigue 11/10/2013  . Intracranial atherosclerosis 11/10/2013  . Hyperreflexia 10/04/2013  . TIA (transient ischemic attack) 10/04/2013  . Fibroid uterus 09/14/2012  . Perimenopause 09/14/2012  . Chronic cough 08/12/2012  . DOE (dyspnea on exertion) 08/12/2012  . History of CVA (cerebrovascular accident) 08/09/2012  . Chest pain, mid sternal 04/07/2012  . Pulmonary hypertension ([place]) 04/06/2012  . Polysubstance abuse ([place]) 04/06/2012  . History of cocaine abuse ([place]) 11/16/2011  . Major depressive disorder, recurrent episode with psychotic features. 11/15/2011    Class: Acute  . Chronic diastolic heart failure ([place]) 02/24/2011  . CAD (coronary artery disease) 02/24/2011  . Hyperlipidemia 01/06/2011  . Essential hypertension 12/15/2010  . Exertional dyspnea 12/15/2010  . Leukocytoclastic vasculitis ([place]) 03/24/2007    Past Surgical History:  Procedure Laterality Date  . ANKLE SURGERY     right  . BONE MARROW ASPIRATION    . CARDIAC CATHETERIZATION    . CARDIAC CATHETERIZATION N/A 04/23/2015   Procedure: Right Heart Cath;  Surgeon: [name], MD;  Location: [facility];  Service: Cardiovascular;  Laterality: N/A;  . CARDIOVERSION N/A 03/11/2018   Procedure: CARDIOVERSION;  Surgeon: [name], MD;  Location: [facility] ENDOSCOPY;  Service: Cardiovascular;  Laterality: N/A;  . I&D EXTREMITY  08/09/2011   Procedure: IRRIGATION AND DEBRIDEMENT EXTREMITY;  Surgeon: [name], MD;  Location: [facility];  Service: General;  Laterality: Left;  i & D Left axilla abscess  . LYMPH NODE BIOPSY N/A 04/05/2013   Procedure: EXCISIONAL BIOPSY RIGHT  SUBMANDIBULAR NODE, NASAL ENDOSCOPIC WITH BIOPSY NASAL PHARYNX;  Surgeon: [name], MD;  Location: [facility];  Service: ENT;  Laterality: N/A;  . TEE WITHOUT CARDIOVERSION N/A 03/11/2018   Procedure: TRANSESOPHAGEAL ECHOCARDIOGRAM (TEE);  Surgeon: [name], MD;  Location: [facility] ENDOSCOPY;  Service: Cardiovascular;  Laterality: N/A;     OB History    Gravida  4   Para  2   Term  2   Preterm      AB  2   Living  2     SAB      TAB  2   Ectopic      Multiple      Live Births               Home Medications    Prior to Admission medications   Medication Sig Start Date End Date Taking? Authorizing Provider  amiodarone (PACERONE) 200 MG tablet Take 1 tablet (200 mg total) by mouth daily. 06/23/18  Yes Kilroy, Luke K, PA-C  atorvastatin (LIPITOR) 40 MG tablet Take 1 tablet (40 mg total) by mouth at  bedtime. 05/03/18  Yes Ladell Pier, MD  buPROPion [facility] SR) 150 MG 12 hr tablet Take 1 tablet (150 mg total) by mouth 2 (two) times daily. 04/11/18  Yes Ladell Pier, MD  citalopram (CELEXA) 40 MG tablet Take 1 tablet (40 mg total) by mouth daily. 04/11/18  Yes Ladell Pier, MD  cloNIDine (CATAPRES) 0.1 MG tablet Take 1 tablet (0.1 mg total) by mouth 2 (two) times daily. MUST KEEP APPT ON 11/07/18 FOR FURTHER REFILLS 10/12/18  Yes Ladell Pier, MD  diltiazem (CARDIZEM CD) 180 MG 24 hr capsule Take 1 capsule (180 mg total) by mouth daily. 04/11/18  Yes Ladell Pier, MD  furosemide (LASIX) 40 MG tablet Take 1 tablet (40 mg total) by mouth 2 (two) times daily. 04/13/18  Yes [name], MD  lisinopril (PRINIVIL,ZESTRIL) 10 MG tablet Take 1 tablet (10 mg total) by mouth daily. 05/02/18 12/16/18 Yes Cherene Altes, MD  loratadine (CLARITIN) 10 MG tablet Take 1 tablet (10 mg total) by mouth daily as needed for allergies. 06/22/18  Yes Ladell Pier, MD  rivaroxaban (XARELTO) 20 MG TABS tablet Take 1 tablet (20 mg total) by mouth daily with supper.  10/03/18  Yes Kilroy, Luke K, PA-C  topiramate (TOPAMAX) 25 MG tablet Take 2 tablets (50 mg total) by mouth at bedtime. 04/12/18  Yes Ladell Pier, MD  traZODone (DESYREL) 150 MG tablet Take 1 tablet (150 mg total) by mouth at bedtime. 04/11/18  Yes Ladell Pier, MD    Family History Family History  Problem Relation Age of Onset  . Coronary artery disease Mother   . Diabetes Mother   . Diabetes Father   . Hypertension Father   . Coronary artery disease Father   . Heart attack Father   . Breast cancer Maternal Grandmother   . Diabetes Maternal Grandmother   . Heart attack Maternal Grandmother   . Stroke Maternal Grandmother   . Asthma Grandson     Social History Social History   Tobacco Use  . Smoking status: Current Every Day Smoker    Packs/day: 2.00    Years: 30.00    Pack years: 60.00    Types: Cigarettes  . Smokeless tobacco: Never Used  . Tobacco comment: 1/2 ppd 10/10/18  Substance Use Topics  . Alcohol use: No    Alcohol/week: 0.0 standard drinks    Comment: pint of wine 2-3 times a week--ocassional - quit 2012  . Drug use: No    Frequency: 1.0 times per week    Types: "Crack" cocaine    Comment: none since 02/04/14     Allergies   Ciprofloxacin   Review of Systems Review of Systems  All other systems reviewed and are negative.    Physical Exam Updated Vital Signs BP 113/81   Pulse 86   Temp 100.2 F (37.9 C) (Oral)   Resp (!) 24   Ht 5\' 6"  (1.676 m)   Wt 87.1 kg   LMP 05/10/2013   SpO2 99%   BMI 30.99 kg/m   Physical Exam Vitals signs and nursing note reviewed. Exam conducted with a chaperone present.  Constitutional:      Appearance: Normal appearance.     Comments: Shaking with chills.  HENT:     Head: Normocephalic and atraumatic.     Mouth/Throat:     Comments: Mildly erythematous with enlarged tonsils, no obvious exudates.  No uvular deviation.  No masses appreciated. Eyes:     General:  No scleral icterus.     Conjunctiva/sclera: Conjunctivae normal.  Neck:     Musculoskeletal: Normal range of motion and neck supple. No neck rigidity.  Cardiovascular:     Rate and Rhythm: Normal rate and regular rhythm.     Pulses: Normal pulses.     Heart sounds: Normal heart sounds.  Pulmonary:     Comments: Tachypneic to 33.  No respiratory distress. Abdominal:     General: Abdomen is flat. There is no distension.     Palpations: Abdomen is soft. There is no mass.     Tenderness: There is no abdominal tenderness. There is no guarding.  Skin:    General: Skin is dry.  Neurological:     General: No focal deficit present.     Mental Status: She is alert and oriented to person, place, and time.     GCS: GCS eye subscore is 4. GCS verbal subscore is 5. GCS motor subscore is 6.  Psychiatric:        Mood and Affect: Mood normal.        Behavior: Behavior normal.        Thought Content: Thought content normal.      ED Treatments / Results  Labs (all labs ordered are listed, but only abnormal results are displayed) Labs Reviewed  COMPREHENSIVE METABOLIC PANEL - Abnormal; Notable for the following components:      Result Value   Glucose, Bld 129 (*)    Creatinine, Ser 1.44 (*)    Calcium 8.8 (*)    Albumin 3.4 (*)    AST 76 (*)    ALT 52 (*)    Total Bilirubin 1.9 (*)    GFR calc non Af Amer 41 (*)    GFR calc Af Amer 47 (*)    All other components within normal limits  CBC WITH DIFFERENTIAL/PLATELET - Abnormal; Notable for the following components:   WBC 1.5 (*)    Hemoglobin 10.9 (*)    HCT 35.7 (*)    MCH 25.9 (*)    RDW 15.7 (*)    Neutro Abs <0.1 (*)    All other components within normal limits  PROTIME-INR - Abnormal; Notable for the following components:   Prothrombin Time 20.8 (*)    INR 1.8 (*)    All other components within normal limits  URINALYSIS, ROUTINE W REFLEX MICROSCOPIC - Abnormal; Notable for the following components:   Color, Urine AMBER (*)    APPearance HAZY (*)     Hgb urine dipstick MODERATE (*)    Ketones, ur 5 (*)    Protein, ur 100 (*)    All other components within normal limits  APTT - Abnormal; Notable for the following components:   aPTT 43 (*)    All other components within normal limits  TROPONIN I (HIGH SENSITIVITY) - Abnormal; Notable for the following components:   Troponin I (High Sensitivity) 23 (*)    All other components within normal limits  TROPONIN I (HIGH SENSITIVITY) - Abnormal; Notable for the following components:   Troponin I (High Sensitivity) 22 (*)    All other components within normal limits  GROUP A STREP BY PCR  SARS CORONAVIRUS 2 (HOSPITAL ORDER, [facility])  CULTURE, BLOOD (ROUTINE X 2)  CULTURE, BLOOD (ROUTINE X 2)  URINE CULTURE  SARS CORONAVIRUS 2 (HOSPITAL ORDER, [facility])  LACTIC ACID, PLASMA  HIV ANTIBODY (ROUTINE TESTING W REFLEX)  ANTI-DNA ANTIBODY, DOUBLE-STRANDED  HEPATITIS C ANTIBODY (REFLEX)  CYCLIC CITRUL PEPTIDE ANTIBODY, IGG/IGA  RHEUMATOID FACTOR  ANA W/REFLEX IF POSITIVE  HISTONE ANTIBODIES, IGG, BLOOD  ANTIEXTRACTABLE NUCLEAR AG  VITAMIN B12  IRON AND TIBC  FERRITIN  FOLATE RBC  EPSTEIN-BARR VIRUS VCA, IGM  CMV IGM  I-STAT BETA HCG BLOOD, ED ([facility], [facility], [facility] ONLY)  DIRECT ANTIGLOBULIN TEST (NOT AT [facility])    EKG EKG Interpretation  Date/Time:  Friday December 16 2018 18:56:10 EDT Ventricular Rate:  110 PR Interval:    QRS Duration: 105 QT Interval:  369 QTC Calculation: 506 R Axis:   40 Text Interpretation:  Sinus tachycardia Repol abnrm, severe global ischemia (LM/MVD) since last tracing no significant change Confirmed by Noemi Chapel [phone]) on 12/16/2018 7:08:47 PM   Radiology Dg Chest Portable 1 View  Result Date: 12/16/2018 CLINICAL DATA:  shortness of breath, sore throat and fever that started yesterday- has not been around anyone with covid that she is aware of. EXAM: PORTABLE CHEST 1 VIEW COMPARISON:  Chest  radiograph 04/25/2018 FINDINGS: Stable cardiomediastinal contours with enlarged heart size. Small bandlike opacity at the right lung base favored to represent atelectasis or scarring. No focal infiltrate identified. No pneumothorax or pleural effusion. No acute finding in the visualized skeleton. IMPRESSION: Small bandlike opacity at the right lung base favored to represent atelectasis or scarring. No other acute finding. Electronically Signed   By: Audie Pinto M.D.   On: 12/16/2018 16:34    Procedures Procedures (including critical care time)  Medications Ordered in ED Medications  vancomycin (VANCOCIN) 1,750 mg in sodium chloride 0.9 % 500 mL IVPB (has no administration in time range)  ondansetron (ZOFRAN) injection 4 mg (4 mg Intravenous Not Given 12/16/18 1949)  lactated ringers infusion (has no administration in time range)  acetaminophen (TYLENOL) tablet 650 mg (650 mg Oral Given 12/16/18 1338)  sodium chloride 0.9 % bolus 1,000 mL (0 mLs Intravenous Stopped 12/16/18 1935)  ceFEPIme (MAXIPIME) 2 g in sodium chloride 0.9 % 100 mL IVPB (0 g Intravenous Stopped 12/16/18 1901)  vancomycin (VANCOCIN) 1,750 mg in sodium chloride 0.9 % 500 mL IVPB (1,750 mg Intravenous New Bag/Given 12/16/18 1933)  acetaminophen (TYLENOL) tablet 1,000 mg (1,000 mg Oral Given 12/16/18 1928)  sodium chloride 0.9 % bolus 1,000 mL (1,000 mLs Intravenous New Bag/Given 12/16/18 1935)  ondansetron (ZOFRAN) injection 4 mg (4 mg Intravenous Given 12/16/18 1938)     Initial Impression / Assessment and Plan / ED Course  I have reviewed the triage vital signs and the nursing notes.  Pertinent labs & imaging results that were available during my care of the patient were reviewed by me and considered in my medical decision making (see chart for details).       Patient's fever of 103 on arrival in conjunction with her severe neutropenia is concerning for a febrile neutropenia and she will likely need to be admitted to the  hospital.  Her white count is 1.5 and she has 0 neutrophils.  Will obtain sepsis order set and provide fluids.  Rapid COVID-19 test ordered.   Given her report of squeezing, constant 5 out of 10 chest pain, obtained troponin in addition to EKG.  EKG is relatively unchanged from prior readings and her troponin is only mildly elevated to 23 despite greater than 12 hour duration of squeezing chest pain.   Her fever came down initially, but spiked back up to 102 approximately 5 hours into her stay here in the ED. She also once again became  tachypneic into the 30s.  Provided her more Tylenol and another liter of NS.  Urinalysis was interpreted and does not appear to be UTI.  She denied any urinary symptoms.  Her chest x-ray was interpreted and there does not appear to be an obvious pneumonia.   Considered group A strep given her history and enlarged tonsils on physical exam, but rapid strep test came back negative.  Given her symptoms and fever, suspected COVID-19 infection, but her test came back negative.  At this point, will contact admitting physicians to have patient admitted to the hospital for a neutropenic fever.     Final Clinical Impressions(s) / ED Diagnoses   Final diagnoses:  Neutropenic fever ([place])  Sepsis without acute organ dysfunction, due to unspecified organism [facility])    ED Discharge Orders    None       Reita Chard 12/16/18 2300    Noemi Chapel, MD 12/17/18 [phone]

## 2018-12-16 NOTE — H&P (Signed)
Date: 12/17/2018               Patient Name:  Morgan Bean MRN: [id number]  DOB: 1965/09/29 Age / Sex: 53 y.o., female   PCP: Ladell Pier, MD         Medical Service: Internal Medicine Teaching Service         Attending Physician: Dr. Rebeca Alert Raynaldo Opitz, MD    First Contact: Dr. Gilford Rile Pager: [phone]  Second Contact: Dr. Sherry Ruffing Pager: [phone]       After Hours (After 5p/  First Contact Pager: [phone]  weekends / holidays): Second Contact Pager: [phone]   Chief Complaint: Sore throat  History of Present Illness: Morgan Bean is a 53 y.o female with hypertension, hyperlipidemia, CVA, CAD, COPD, CHF, and anemia/neutropenia who presented to the ED with a sore throat of 2 days duration. History was obtained via the patient and through chart review.   The patient was in her usual state of health until 2 days ago when she acutely developed sore throat, myalgias, arthralgias, fevers, chills, productive cough (thick and yellow), SHOB, and lack of appetite. She has noticed a change in her voice, bilateral neck pain, and trismus. Her symptoms have caused so much discomfort that she has been unable to sleep. She has not tried any OTC or prescription medications to help with her symptoms. In addition to the above symptoms she has experienced nocturia (4x/night) and diffuse itchiness. She denies abdominal pain, diarrhea, dysuria, hematuria, bloody bowel movements, difficulties with PO intake, open wounds, back pain, LE swelling.   She has been told she had a low WBC before and got a bone marrow biopsy that she reporst was normal. She has never had opthalmologic symptoms, oral/genital ulcers, seizures or neurological symptoms, VTE/PE, dermatological symptoms, arthritis. She has been told she had fluid around her heart and was subsequently admitted for this. She does not remember what happened during that hospitalization. She denies a family history of autoimmune disease. She denies a  personal history of autoimmune diseases or seeing a rheumatologist in the past.   Meds:  Current Meds  Medication Sig   amiodarone (PACERONE) 200 MG tablet Take 1 tablet (200 mg total) by mouth daily.   atorvastatin (LIPITOR) 40 MG tablet Take 1 tablet (40 mg total) by mouth at bedtime.   buPROPion (WELLBUTRIN SR) 150 MG 12 hr tablet Take 1 tablet (150 mg total) by mouth 2 (two) times daily.   citalopram (CELEXA) 40 MG tablet Take 1 tablet (40 mg total) by mouth daily.   cloNIDine (CATAPRES) 0.1 MG tablet Take 1 tablet (0.1 mg total) by mouth 2 (two) times daily. MUST KEEP APPT ON 11/07/18 FOR FURTHER REFILLS   diltiazem (CARDIZEM CD) 180 MG 24 hr capsule Take 1 capsule (180 mg total) by mouth daily.   furosemide (LASIX) 40 MG tablet Take 1 tablet (40 mg total) by mouth 2 (two) times daily.   lisinopril (PRINIVIL,ZESTRIL) 10 MG tablet Take 1 tablet (10 mg total) by mouth daily.   loratadine (CLARITIN) 10 MG tablet Take 1 tablet (10 mg total) by mouth daily as needed for allergies.   rivaroxaban (XARELTO) 20 MG TABS tablet Take 1 tablet (20 mg total) by mouth daily with supper.   topiramate (TOPAMAX) 25 MG tablet Take 2 tablets (50 mg total) by mouth at bedtime.   traZODone (DESYREL) 150 MG tablet Take 1 tablet (150 mg total) by mouth at bedtime.   Allergies: Allergies as of 12/16/2018 -  Review Complete 12/16/2018  Allergen Reaction Noted   Ciprofloxacin Itching 12/15/2010   Past Medical History:  Diagnosis Date   Anemia of other chronic disease 11/13/2013   Anginal pain ([name])    none in past year   Anxiety    Asthma    Candidiasis 06/23/2013   Chronic diastolic CHF (congestive heart failure) ([place])    Constipation    Coronary artery disease    Lexiscan myoview (10/12) with significant ST depression upon Lexiscan injection but no ischemia or infarction on perfusion images. Left heart cath (10/12): 30% mid LAD, 30% ostial D1, 50% ostial D2.     Cough 11/13/2013    DCIS (ductal carcinoma in situ) of breast    Depression    Headache 02/22/2014   Hx of cardiovascular stress test    Lexiscan Myoview (2/16): Breast attenuation artifact, no ischemia, EF 64%; low risk   Hyperlipemia    Hypertension    Iron deficiency    hx of   Leukocytoclastic vasculitis ([name])    Rash across lower body, occurred in 9/11, diagnosed by skin biopsy. ANA positive. Thought to be secondary to cocaine use.   Leukopenia 03/21/2013   Lymphadenopathy 03/21/2013   Lymphadenopathy 01/30/2014   Mental disorder    Pancytopenia ([place])    Pancytopenia, acquired ([place]) 07/16/2015   Polysubstance abuse ([place])    Prior cocaine   Pulmonary HTN ([place])    Echo (9/11) with EF 65%, mild LVH, mild AI, mild MR, moderate to possibly severe TR with PA systolic pressure 52 mmHg. Echo (10/12): severe LV hypertrophy, EF 60-65%, mild MR, mild AI, moderate to severe tricuspid regurgitation, PA systolic pressure 38 mmHg.  Echo 4/14: moderate LVH, EF 65%, normal wall motion, diastolic dysfunction, mild AI, mild MR   Shortness of breath    a. PFTs 5/14:  FEV1/FVC 99% predicted; FVC 60% predicted; DLCO mildly reduced, mild restriction, air trapping.;  b.  seen by pulmo (Dr. [name]) 5/14: mainly upper airway symptoms - ACE d/c'd (sx's better)   Stroke [facility]) 2010ish   Syphilis 04/25/2013   Tobacco abuse    Tricuspid regurgitation    Unspecified deficiency anemia 03/29/2013   Family History:  Mother: + DM, breast cancer Father: + DM, CAD Denies family history of malignancy  Social History:  Lives with her friend and his wife.  Has family in the area Smokes 1PPD for 30 years Occasional EtOH use Smokes crack cocaine 1-2 times per week  Review of Systems: A complete ROS was negative except as per HPI.   Physical Exam: Blood pressure 113/81, pulse 86, temperature 100.2 F (37.9 C), temperature source Oral, resp. rate (!) 24, height '5\' 6"'$  (1.676 m), weight 87.1 kg, last menstrual period  05/10/2013, SpO2 99 %. Physical Exam  Constitutional: She is oriented to person, place, and time. She appears distressed.  Eyes: Conjunctivae and EOM are normal.  Cardiovascular: Normal rate, regular rhythm, normal heart sounds and intact distal pulses. Exam reveals no gallop and no friction rub.  Pulmonary/Chest: No accessory muscle usage. Tachypnea noted. No respiratory distress. She has no wheezes. She has rales (right base).  Abdominal: Soft. She exhibits no distension. There is no abdominal tenderness.  Musculoskeletal: Normal range of motion.        General: No edema.  Lymphadenopathy:  Bilateral submental, submandibular, tonsillar LAD.  Negative pre-and postauricular or occipital LAD.  Neurological: She is alert and oriented to person, place, and time.  Skin: Skin is warm. She is diaphoretic.  EKG: personally reviewed my interpretation is Sinus tachycardia  CXR: personally reviewed my interpretation is Small bandlike opacity at the right lung base favored to represent atelectasis or scarring. No other acute finding.  CT neck: 1. Findings consistent with acute supraglottitis with involvement of the epiglottis and narrowing of the supraglottic airway. Patient felt to be at risk for airway compromise. No abscess or drainable fluid collection. Associated mass-like thickening and edema of the hypopharynx at the level of the piriform sinuses, likely reflecting acute inflammation, although an underlying mass could also have this appearance. ENT follow-up with repeat imaging following convalescence to ensure these changes resolve is recommended. 2. Enlarged bilateral level II/III adenopathy, likely reactive. 3. Poor dentition.   Assessment & Plan by Problem: Active Problems:   Chronic diastolic heart failure ([name])   Head and neck lymphadenopathy   Febrile neutropenia ([name])   Kellye Berthelot is a 53 y.o female who presented to the ED with fever and a sore throat of 2 days  duration in the setting of neutropenia which is concerning for infectious processes.  The patient has a significant history of cyclic neutropenic fever episodes.  She has presented to the hospital several times since 2016 and has had at least 3 hospitalizations for neutropenia at [facility] since 2019. The patient has previously been worked up by [employer] and has had several bone marrow biopsies which has ruled out malignant processes or myelodysplastic syndrome.  Heme/Onc notes state that the patient has had cyclic neutropenic episodes that have been responsive to steroids, which may indicate a autoimmune etiology.  Alternatively, the patient has a longstanding history of crack cocaine use which could be the cause of her neutropenia.  Neutropenic fever: - We will give her broad-spectrum antibiotics with cefepime and vancomycin. Blood cultures drawn. - Ordered a CT scan of her neck to rule out peritonsillar abscess. - We will do an autoimmune work-up with autoimmune antibodies - We will perform a urine drug screen as levamazole  is associated with neutropenia - We will do a blood smear, iron studies and ferritin to work-up her anemia. - We will hold her lisinopril and and furosemide at this time due to associations with neutropenia - We will consult ENT based on her CT scan findings and start decadron 10 mg q 8 hours x3 doses and Dilaudid 0.5 q 3 hours for severe pain and secretions.   Dispo: Admit patient to Inpatient with expected length of stay greater than 2 midnights.  Signed: Marianna Payment, MD 12/17/2018, 12:13 AM  Pager: My Pager: [phone]

## 2018-12-17 ENCOUNTER — Other Ambulatory Visit: Payer: Self-pay

## 2018-12-17 DIAGNOSIS — J449 Chronic obstructive pulmonary disease, unspecified: Secondary | ICD-10-CM

## 2018-12-17 DIAGNOSIS — I2510 Atherosclerotic heart disease of native coronary artery without angina pectoris: Secondary | ICD-10-CM

## 2018-12-17 DIAGNOSIS — N183 Chronic kidney disease, stage 3 (moderate): Secondary | ICD-10-CM

## 2018-12-17 DIAGNOSIS — I5030 Unspecified diastolic (congestive) heart failure: Secondary | ICD-10-CM

## 2018-12-17 DIAGNOSIS — D709 Neutropenia, unspecified: Secondary | ICD-10-CM

## 2018-12-17 DIAGNOSIS — J0510 Acute epiglottitis without obstruction: Secondary | ICD-10-CM

## 2018-12-17 DIAGNOSIS — F17210 Nicotine dependence, cigarettes, uncomplicated: Secondary | ICD-10-CM

## 2018-12-17 DIAGNOSIS — I480 Paroxysmal atrial fibrillation: Secondary | ICD-10-CM

## 2018-12-17 DIAGNOSIS — R5081 Fever presenting with conditions classified elsewhere: Secondary | ICD-10-CM

## 2018-12-17 DIAGNOSIS — E785 Hyperlipidemia, unspecified: Secondary | ICD-10-CM

## 2018-12-17 DIAGNOSIS — Z8673 Personal history of transient ischemic attack (TIA), and cerebral infarction without residual deficits: Secondary | ICD-10-CM

## 2018-12-17 DIAGNOSIS — J0430 Supraglottitis, unspecified, without obstruction: Secondary | ICD-10-CM

## 2018-12-17 DIAGNOSIS — Z7901 Long term (current) use of anticoagulants: Secondary | ICD-10-CM

## 2018-12-17 DIAGNOSIS — F1490 Cocaine use, unspecified, uncomplicated: Secondary | ICD-10-CM

## 2018-12-17 DIAGNOSIS — I130 Hypertensive heart and chronic kidney disease with heart failure and stage 1 through stage 4 chronic kidney disease, or unspecified chronic kidney disease: Secondary | ICD-10-CM

## 2018-12-17 DIAGNOSIS — Z881 Allergy status to other antibiotic agents status: Secondary | ICD-10-CM

## 2018-12-17 LAB — HIV ANTIBODY (ROUTINE TESTING W REFLEX): HIV Screen 4th Generation wRfx: NONREACTIVE

## 2018-12-17 LAB — CBC WITH DIFFERENTIAL/PLATELET
Abs Immature Granulocytes: 0 10*3/uL (ref 0.00–0.07)
Basophils Absolute: 0 10*3/uL (ref 0.0–0.1)
Basophils Relative: 1 %
Eosinophils Absolute: 0 10*3/uL (ref 0.0–0.5)
Eosinophils Relative: 1 %
HCT: 30.3 % — ABNORMAL LOW (ref 36.0–46.0)
Hemoglobin: 9.7 g/dL — ABNORMAL LOW (ref 12.0–15.0)
Immature Granulocytes: 0 %
Lymphocytes Relative: 72 %
Lymphs Abs: 0.6 10*3/uL — ABNORMAL LOW (ref 0.7–4.0)
MCH: 26.8 pg (ref 26.0–34.0)
MCHC: 32 g/dL (ref 30.0–36.0)
MCV: 83.7 fL (ref 80.0–100.0)
Monocytes Absolute: 0.2 10*3/uL (ref 0.1–1.0)
Monocytes Relative: 25 %
Neutro Abs: 0 10*3/uL — ABNORMAL LOW (ref 1.7–7.7)
Neutrophils Relative %: 1 %
Platelets: 127 10*3/uL — ABNORMAL LOW (ref 150–400)
RBC: 3.62 MIL/uL — ABNORMAL LOW (ref 3.87–5.11)
RDW: 15.6 % — ABNORMAL HIGH (ref 11.5–15.5)
WBC: 0.8 10*3/uL — CL (ref 4.0–10.5)
nRBC: 0 % (ref 0.0–0.2)

## 2018-12-17 LAB — TECHNOLOGIST SMEAR REVIEW: Plt Morphology: ADEQUATE

## 2018-12-17 LAB — CBG MONITORING, ED: Glucose-Capillary: 102 mg/dL — ABNORMAL HIGH (ref 70–99)

## 2018-12-17 LAB — BASIC METABOLIC PANEL
Anion gap: 7 (ref 5–15)
BUN: 13 mg/dL (ref 6–20)
CO2: 21 mmol/L — ABNORMAL LOW (ref 22–32)
Calcium: 8.3 mg/dL — ABNORMAL LOW (ref 8.9–10.3)
Chloride: 108 mmol/L (ref 98–111)
Creatinine, Ser: 1.16 mg/dL — ABNORMAL HIGH (ref 0.44–1.00)
GFR calc Af Amer: >60 mL/min (ref >60)
GFR calc non Af Amer: 53 mL/min — ABNORMAL LOW (ref >60)
Glucose, Bld: 104 mg/dL — ABNORMAL HIGH (ref 70–99)
Potassium: 3.5 mmol/L (ref 3.5–5.1)
Sodium: 136 mmol/L (ref 135–145)

## 2018-12-17 LAB — DIRECT ANTIGLOBULIN TEST (NOT AT ARMC)
DAT, IgG: NEGATIVE
DAT, complement: NEGATIVE

## 2018-12-17 LAB — IRON AND TIBC
Iron: 7 ug/dL — ABNORMAL LOW (ref 28–170)
Saturation Ratios: 3 % — ABNORMAL LOW (ref 10.4–31.8)
TIBC: 228 ug/dL — ABNORMAL LOW (ref 250–450)
UIBC: 221 ug/dL

## 2018-12-17 LAB — VITAMIN B12: Vitamin B-12: 230 pg/mL (ref 180–914)

## 2018-12-17 LAB — LACTATE DEHYDROGENASE: LDH: 119 U/L (ref 98–192)

## 2018-12-17 LAB — FERRITIN: Ferritin: 285 ng/mL (ref 11–307)

## 2018-12-17 MED ORDER — DEXAMETHASONE SODIUM PHOSPHATE 10 MG/ML IJ SOLN
10.0000 mg | Freq: Three times a day (TID) | INTRAMUSCULAR | Status: AC
  Administered 2018-12-17 (×3): 10 mg via INTRAVENOUS
  Filled 2018-12-17 (×3): qty 1

## 2018-12-17 MED ORDER — HYDROMORPHONE HCL 1 MG/ML IJ SOLN: 0.5000 mg | INTRAMUSCULAR | Status: DC | PRN

## 2018-12-17 NOTE — ED Notes (Signed)
Pt was re-checked after the admin of decadron for stridor per-md, pt became difficult to rouse. CBG was checked, vital signs were within normal limits, and provider was notified. Pt withdrew from pain and became slightly rouseable. Provider assessed the pt and deemed no immediate action was needed.

## 2018-12-17 NOTE — ED Notes (Signed)
Walked patient to the bedroom patient is now back in bed on the monitor with call bell in reach

## 2018-12-17 NOTE — Plan of Care (Signed)
  Problem: Education: Goal: Knowledge of General Education information will improve Description: Including pain rating scale, medication(s)/side effects and non-pharmacologic comfort measures Outcome: Progressing   Problem: Health Behavior/Discharge Planning: Goal: Ability to manage health-related needs will improve Outcome: Progressing   Problem: Clinical Measurements: Goal: Ability to maintain clinical measurements within normal limits will improve Outcome: Progressing Goal: Will remain free from infection Outcome: Progressing Goal: Diagnostic test results will improve Outcome: Progressing   Problem: Safety: Goal: Ability to remain free from injury will improve Outcome: Progressing   

## 2018-12-17 NOTE — ED Notes (Signed)
Pt sleeping soundly.

## 2018-12-17 NOTE — ED Notes (Signed)
SDU  Paged admitting to RN Abbe [place]

## 2018-12-17 NOTE — ED Notes (Signed)
Pt sleeping. 

## 2018-12-17 NOTE — ED Notes (Signed)
ED TO INPATIENT HANDOFF REPORT  ED Nurse Name and Phone #: Lovell Sheehan [phone]  S Name/Age/Gender Morgan Bean 53 y.o. female Room/Bed: 040C/040C  Code Status   Code Status: Full Code  Home/SNF/Other Home Patient oriented to: self, place, time and situation Is this baseline? Yes   Triage Complete: Triage complete  Chief Complaint sore throat  Triage Note C/o shortness of breath, sore throat and fever that started yesterday- has not been around anyone with covid that she is aware of.    Allergies Allergies  Allergen Reactions  . Ciprofloxacin Itching    Level of Care/Admitting Diagnosis ED Disposition    ED Disposition Condition [facility]: [place] [100100]  Level of Care: Progressive [102]  Covid Evaluation: Confirmed COVID Negative  Diagnosis: Febrile neutropenia [facility]) [id number]  Admitting Physician: Oda Kilts [id number]  Attending Physician: Oda Kilts [id number]  Estimated length of stay: 3 - 4 days  Certification:: I certify this patient will need inpatient services for at least 2 midnights  PT Class (Do Not Modify): Inpatient [101]  PT Acc Code (Do Not Modify): Private [1]       B Medical/Surgery History Past Medical History:  Diagnosis Date  . Anemia of other chronic disease 11/13/2013  . Anginal pain ([place])    none in past year  . Anxiety   . Asthma   . Candidiasis 06/23/2013  . Chronic diastolic CHF (congestive heart failure) ([place])   . Constipation   . Coronary artery disease    Lexiscan myoview (10/12) with significant ST depression upon Lexiscan injection but no ischemia or infarction on perfusion images. Left heart cath (10/12): 30% mid LAD, 30% ostial D1, 50% ostial D2.    [name] Cough 11/13/2013  . DCIS (ductal carcinoma in situ) of breast   . Depression   . Headache 02/22/2014  . Hx of cardiovascular stress test    Lexiscan Myoview (2/16): Breast attenuation artifact, no ischemia, EF 64%; low  risk  . Hyperlipemia   . Hypertension   . Iron deficiency    hx of  . Leukocytoclastic vasculitis ([place])    Rash across lower body, occurred in 9/11, diagnosed by skin biopsy. ANA positive. Thought to be secondary to cocaine use.  . Leukopenia 03/21/2013  . Lymphadenopathy 03/21/2013  . Lymphadenopathy 01/30/2014  . Mental disorder   . Pancytopenia ([place])   . Pancytopenia, acquired ([place]) 07/16/2015  . Polysubstance abuse ([place])    Prior cocaine  . Pulmonary HTN ([place])    Echo (9/11) with EF 65%, mild LVH, mild AI, mild MR, moderate to possibly severe TR with PA systolic pressure 52 mmHg. Echo (10/12): severe LV hypertrophy, EF 60-65%, mild MR, mild AI, moderate to severe tricuspid regurgitation, PA systolic pressure 38 mmHg.  Echo 4/14: moderate LVH, EF 65%, normal wall motion, diastolic dysfunction, mild AI, mild MR  . Shortness of breath    a. PFTs 5/14:  FEV1/FVC 99% predicted; FVC 60% predicted; DLCO mildly reduced, mild restriction, air trapping.;  b.  seen by pulmo (Dr. [name]) 5/14: mainly upper airway symptoms - ACE d/c'd (sx's better)  . Stroke ([facility]) 2010ish  . Syphilis 04/25/2013  . Tobacco abuse   . Tricuspid regurgitation   . Unspecified deficiency anemia 03/29/2013   Past Surgical History:  Procedure Laterality Date  . ANKLE SURGERY     right  . BONE MARROW ASPIRATION    . CARDIAC CATHETERIZATION    . CARDIAC CATHETERIZATION N/A 04/23/2015  Procedure: Right Heart Cath;  Surgeon: [name], MD;  Location: [place] CV LAB;  Service: Cardiovascular;  Laterality: N/A;  . CARDIOVERSION N/A 03/11/2018   Procedure: CARDIOVERSION;  Surgeon: [name], MD;  Location[facility] ENDOSCOPY;  Service: Cardiovascular;  Laterality: N/A;  . I&D EXTREMITY  08/09/2011   Procedure: IRRIGATION AND DEBRIDEMENT EXTREMITY;  Surgeon: [name], MD;  Location: [place];  Service: General;  Laterality: Left;  i & D Left axilla abscess  . LYMPH NODE BIOPSY N/A 04/05/2013   Procedure: EXCISIONAL  BIOPSY RIGHT SUBMANDIBULAR NODE, NASAL ENDOSCOPIC WITH BIOPSY NASAL PHARYNX;  Surgeon: [name], MD;  Location: [place];  Service: ENT;  Laterality: N/A;  . TEE WITHOUT CARDIOVERSION N/A 03/11/2018   Procedure: TRANSESOPHAGEAL ECHOCARDIOGRAM (TEE);  Surgeon: [name], MD;  Location: [facility] ENDOSCOPY;  Service: Cardiovascular;  Laterality: N/A;     A IV Location/Drains/Wounds Patient Lines/Drains/Airways Status   Active Line/Drains/Airways    Name:   Placement date:   Placement time:   Site:   Days:   Peripheral IV 12/16/18 Left Antecubital   12/16/18    1719    Antecubital   1   Incision 08/09/11 Shoulder Left   08/09/11    1506     2687   Incision (Closed) 06/07/13 Back Lower;Mid   06/07/13    1230     2019   Wound 08/09/11 Other (Comment) Axilla Left   08/09/11    -    Axilla   2687          Intake/Output Last 24 hours  Intake/Output Summary (Last 24 hours) at 12/17/2018 1449 Last data filed at 12/17/2018 [id number] Gross per 24 hour  Intake 100 ml  Output -  Net 100 ml    Labs/Imaging Results for orders placed or performed during the hospital encounter of 12/16/18 (from the past 48 hour(s))  Culture, blood (Routine x 2)     Status: None (Preliminary result)   Collection Time: 12/16/18  1:30 PM   Specimen: BLOOD  Result Value Ref Range   Specimen Description BLOOD RIGHT ANTECUBITAL    Special Requests      BOTTLES DRAWN AEROBIC AND ANAEROBIC Blood Culture adequate volume   Culture      NO GROWTH < 24 HOURS Performed at [facility], [place] [address], [place], [place] [address]    Report Status PENDING   Comprehensive metabolic panel     Status: Abnormal   Collection Time: 12/16/18  1:35 PM  Result Value Ref Range   Sodium 135 135 - 145 mmol/L   Potassium 3.6 3.5 - 5.1 mmol/L   Chloride 100 98 - 111 mmol/L   CO2 22 22 - 32 mmol/L   Glucose, Bld 129 (H) 70 - 99 mg/dL   BUN 14 6 - 20 mg/dL   Creatinine, Ser 1.44 (H) 0.44 - 1.00 mg/dL   Calcium 8.8 (L) 8.9 - 10.3  mg/dL   Total Protein 7.8 6.5 - 8.1 g/dL   Albumin 3.4 (L) 3.5 - 5.0 g/dL   AST 76 (H) 15 - 41 U/L   ALT 52 (H) 0 - 44 U/L   Alkaline Phosphatase 75 38 - 126 U/L   Total Bilirubin 1.9 (H) 0.3 - 1.2 mg/dL   GFR calc non Af Amer 41 (L) >60 mL/min   GFR calc Af Amer 47 (L) >60 mL/min   Anion gap 13 5 - 15    Comment: Performed at [facility], [place] [address] [place], [place] [address]  Lactic acid, plasma     Status: None   Collection Time: 12/16/18  1:35 PM  Result Value Ref Range   Lactic Acid, Venous 1.0 0.5 - 1.9 mmol/L    Comment: Performed at [facility] [address], [place], [place] [address]  CBC with Differential     Status: Abnormal   Collection Time: 12/16/18  1:35 PM  Result Value Ref Range   WBC 1.5 (L) 4.0 - 10.5 K/uL   RBC 4.21 3.87 - 5.11 MIL/uL   Hemoglobin 10.9 (L) 12.0 - 15.0 g/dL   HCT 35.7 (L) 36.0 - 46.0 %   MCV 84.8 80.0 - 100.0 fL   MCH 25.9 (L) 26.0 - 34.0 pg   MCHC 30.5 30.0 - 36.0 g/dL   RDW 15.7 (H) 11.5 - 15.5 %   Platelets 173 150 - 400 K/uL   nRBC 0.0 0.0 - 0.2 %   Neutrophils Relative % 0 %   Neutro Abs <0.1 (L) 1.7 - 7.7 K/uL   Lymphocytes Relative 92 %   Lymphs Abs 1.4 0.7 - 4.0 K/uL   Monocytes Relative 7 %   Monocytes Absolute 0.1 0.1 - 1.0 K/uL   Eosinophils Relative 0 %   Eosinophils Absolute 0.0 0.0 - 0.5 K/uL   Basophils Relative 1 %   Basophils Absolute 0.0 0.0 - 0.1 K/uL   WBC Morphology See Note     Comment: Absolute Lymphocytosis   Abs Immature Granulocytes 0.00 0.00 - 0.07 K/uL    Comment: Performed at [facility], [place] [address], [place], [place] [address]  Protime-INR     Status: Abnormal   Collection Time: 12/16/18  1:35 PM  Result Value Ref Range   Prothrombin Time 20.8 (H) 11.4 - 15.2 seconds   INR 1.8 (H) 0.8 - 1.2    Comment: (NOTE) INR goal varies based on device and disease states. Performed at [facility], [place] [address], [place], [place] [address]   Troponin I (High Sensitivity)      Status: Abnormal   Collection Time: 12/16/18  5:20 PM  Result Value Ref Range   Troponin I (High Sensitivity) 23 (H) <18 ng/L    Comment: (NOTE) Elevated high sensitivity troponin I (hsTnI) values and significant  changes across serial measurements may suggest ACS but many other  chronic and acute conditions are known to elevate hsTnI results.  Refer to the "Links" section for chest pain algorithms and additional  guidance. Performed at [facility], [place] [address], [place], [place] [address]   APTT     Status: Abnormal   Collection Time: 12/16/18  5:20 PM  Result Value Ref Range   aPTT 43 (H) 24 - 36 seconds    Comment:        IF BASELINE aPTT IS ELEVATED, SUGGEST PATIENT RISK ASSESSMENT BE USED TO DETERMINE APPROPRIATE ANTICOAGULANT THERAPY. Performed at [facility], [place] [address] [place], [place] [address]   Culture, blood (Routine x 2)     Status: None (Preliminary result)   Collection Time: 12/16/18  5:27 PM   Specimen: BLOOD  Result Value Ref Range   Specimen Description BLOOD LEFT ANTECUBITAL    Special Requests      BOTTLES DRAWN AEROBIC AND ANAEROBIC Blood Culture results may not be optimal due to an excessive volume of blood received in culture bottles   Culture  NO GROWTH < 24 HOURS Performed at [facility] [address], [place], [place] [address]    Report Status PENDING   I-Stat beta hCG blood, ED     Status: None   Collection Time: 12/16/18  5:33 PM  Result Value Ref Range   I-stat hCG, quantitative <5.0 <5 mIU/mL   Comment 3            Comment:   GEST. AGE      CONC.  (mIU/mL)   <=1 WEEK        5 - 50     2 WEEKS       50 - 500     3 WEEKS       100 - 10,000     4 WEEKS     1,000 - 30,000        FEMALE AND NON-PREGNANT FEMALE:     LESS THAN 5 mIU/mL   Group A Strep by PCR     Status: None   Collection Time: 12/16/18  7:27 PM   Specimen: Nasopharyngeal Swab; Sterile Swab  Result Value Ref Range   Group A Strep by PCR NOT  DETECTED NOT DETECTED    Comment: Performed at [facility], [place] [address], [place], [place] [address]  Troponin I (High Sensitivity)     Status: Abnormal   Collection Time: 12/16/18  7:43 PM  Result Value Ref Range   Troponin I (High Sensitivity) 22 (H) <18 ng/L    Comment: (NOTE) Elevated high sensitivity troponin I (hsTnI) values and significant  changes across serial measurements may suggest ACS but many other  chronic and acute conditions are known to elevate hsTnI results.  Refer to the "Links" section for chest pain algorithms and additional  guidance. Performed at [facility], [place] [address] [place], [place] [address]   SARS Coronavirus 2 [facility] order, Performed in [facility] hospital lab) Nasopharyngeal Nasopharyngeal Swab     Status: None   Collection Time: 12/16/18  7:46 PM   Specimen: Nasopharyngeal Swab  Result Value Ref Range   SARS Coronavirus 2 NEGATIVE NEGATIVE    Comment: (NOTE) If result is NEGATIVE SARS-CoV-2 target nucleic acids are NOT DETECTED. The SARS-CoV-2 RNA is generally detectable in upper and lower  respiratory specimens during the acute phase of infection. The lowest  concentration of SARS-CoV-2 viral copies this assay can detect is 250  copies / mL. A negative result does not preclude SARS-CoV-2 infection  and should not be used as the sole basis for treatment or other  patient management decisions.  A negative result may occur with  improper specimen collection / handling, submission of specimen other  than nasopharyngeal swab, presence of viral mutation(s) within the  areas targeted by this assay, and inadequate number of viral copies  (<250 copies / mL). A negative result must be combined with clinical  observations, patient history, and epidemiological information. If result is POSITIVE SARS-CoV-2 target nucleic acids are DETECTED. The SARS-CoV-2 RNA is generally detectable in upper and lower  respiratory specimens dur ing  the acute phase of infection.  Positive  results are indicative of active infection with SARS-CoV-2.  Clinical  correlation with patient history and other diagnostic information is  necessary to determine patient infection status.  Positive results do  not rule out bacterial infection or co-infection with other viruses. If result is PRESUMPTIVE POSTIVE SARS-CoV-2 nucleic acids MAY BE PRESENT.   A presumptive positive result was  obtained on the submitted specimen  and confirmed on repeat testing.  While 2019 novel coronavirus  (SARS-CoV-2) nucleic acids may be present in the submitted sample  additional confirmatory testing may be necessary for epidemiological  and / or clinical management purposes  to differentiate between  SARS-CoV-2 and other Sarbecovirus currently known to infect humans.  If clinically indicated additional testing with an alternate test  methodology [phone]) is advised. The SARS-CoV-2 RNA is generally  detectable in upper and lower respiratory sp ecimens during the acute  phase of infection. The expected result is Negative. Fact Sheet for Patients:  StrictlyIdeas.no Fact Sheet for Healthcare Providers: BankingDealers.co.za This test is not yet approved or cleared by the [place] FDA and has been authorized for detection and/or diagnosis of SARS-CoV-2 by FDA under an Emergency Use Authorization (EUA).  This EUA will remain in effect (meaning this test can be used) for the duration of the COVID-19 declaration under Section 564(b)(1) of the Act, 21 U.S.C. section 360bbb-3(b)(1), unless the authorization is terminated or revoked sooner. Performed at [facility], [place] [address], [place], [place] [address]   Urinalysis, Routine w reflex microscopic     Status: Abnormal   Collection Time: 12/16/18  8:33 PM  Result Value Ref Range   Color, Urine AMBER (A) YELLOW    Comment: BIOCHEMICALS MAY BE AFFECTED BY COLOR    APPearance HAZY (A) CLEAR   Specific Gravity, Urine 1.024 1.005 - 1.030   pH 5.0 5.0 - 8.0   Glucose, UA NEGATIVE NEGATIVE mg/dL   Hgb urine dipstick MODERATE (A) NEGATIVE   Bilirubin Urine NEGATIVE NEGATIVE   Ketones, ur 5 (A) NEGATIVE mg/dL   Protein, ur 100 (A) NEGATIVE mg/dL   Nitrite NEGATIVE NEGATIVE   Leukocytes,Ua NEGATIVE NEGATIVE   RBC / HPF 11-20 0 - 5 RBC/hpf   WBC, UA 0-5 0 - 5 WBC/hpf   Bacteria, UA NONE SEEN NONE SEEN   Squamous Epithelial / LPF 0-5 0 - 5   Mucus PRESENT     Comment: Performed at [facility], [place] [address] [place], [place] [address]  Urine rapid drug screen (hosp performed)     Status: Abnormal   Collection Time: 12/16/18  8:34 PM  Result Value Ref Range   Opiates NONE DETECTED NONE DETECTED   Cocaine POSITIVE (A) NONE DETECTED   Benzodiazepines NONE DETECTED NONE DETECTED   Amphetamines NONE DETECTED NONE DETECTED   Tetrahydrocannabinol NONE DETECTED NONE DETECTED   Barbiturates NONE DETECTED NONE DETECTED    Comment: (NOTE) DRUG SCREEN FOR MEDICAL PURPOSES ONLY.  IF CONFIRMATION IS NEEDED FOR ANY PURPOSE, NOTIFY LAB WITHIN 5 DAYS. LOWEST DETECTABLE LIMITS FOR URINE DRUG SCREEN Drug Class                     Cutoff (ng/mL) Amphetamine and metabolites    1000 Barbiturate and metabolites    200 Benzodiazepine                 [id number] Tricyclics and metabolites     300 Opiates and metabolites        300 Cocaine and metabolites        300 THC                            50 Performed at [facility], [place] [address], [place], [place] [address]   Vitamin B12     Status:  None   Collection Time: 12/16/18 11:00 PM  Result Value Ref Range   Vitamin B-12 230 180 - 914 pg/mL    Comment: (NOTE) This assay is not validated for testing neonatal or myeloproliferative syndrome specimens for Vitamin B12 levels. Performed at [facility], [place] [address] [place], [place] [address]   Iron and TIBC     Status: Abnormal   Collection  Time: 12/16/18 11:00 PM  Result Value Ref Range   Iron 7 (L) 28 - 170 ug/dL   TIBC 228 (L) 250 - 450 ug/dL   Saturation Ratios 3 (L) 10.4 - 31.8 %   UIBC 221 ug/dL    Comment: Performed at [facility] [address], [place], [place] [address]  Ferritin     Status: None   Collection Time: 12/16/18 11:00 PM  Result Value Ref Range   Ferritin 285 11 - 307 ng/mL    Comment: Performed at [facility], [place] [address], [place], [place] [address]  Direct antiglobulin test (not at [facility])     Status: None   Collection Time: 12/16/18 11:00 PM  Result Value Ref Range   DAT, complement NEG    DAT, IgG      NEG Performed at [facility] [address] [place], [place] [address]   Technologist smear review     Status: None   Collection Time: 12/16/18 11:00 PM  Result Value Ref Range   WBC Morphology ABSOLUTE LYMPHOCYTOSIS    RBC Morphology MORPHOLOGY UNREMARKABLE    Tech Review PLATELETS APPEAR ADEQUATE     Comment: Performed at [facility], [address] [place],  [address]  CBG monitoring, ED     Status: Abnormal   Collection Time: 12/17/18  2:19 AM  Result Value Ref Range   Glucose-Capillary 102 (H) 70 - 99 mg/dL  Basic metabolic panel     Status: Abnormal   Collection Time: 12/17/18  3:51 AM  Result Value Ref Range   Sodium 136 135 - 145 mmol/L   Potassium 3.5 3.5 - 5.1 mmol/L   Chloride 108 98 - 111 mmol/L   CO2 21 (L) 22 - 32 mmol/L   Glucose, Bld 104 (H) 70 - 99 mg/dL   BUN 13 6 - 20 mg/dL   Creatinine, Ser 1.16 (H) 0.44 - 1.00 mg/dL   Calcium 8.3 (L) 8.9 - 10.3 mg/dL   GFR calc non Af Amer 53 (L) >60 mL/min   GFR calc Af Amer >60 >60 mL/min   Anion gap 7 5 - 15    Comment: Performed at [facility], [place] [address] [place],  [address]  CBC with Differential/Platelet     Status: Abnormal   Collection Time: 12/17/18  3:51 AM  Result Value Ref Range   WBC 0.8 (LL) 4.0 - 10.5 K/uL    Comment: REPEATED TO VERIFY WHITE COUNT  CONFIRMED ON SMEAR THIS CRITICAL RESULT HAS VERIFIED AND BEEN CALLED TO M. FLORES,RN BY TERRAN TYSOR ON 09 26 2020 AT 0440, AND HAS BEEN READ BACK.     RBC 3.62 (L) 3.87 - 5.11 MIL/uL   Hemoglobin 9.7 (L) 12.0 - 15.0 g/dL   HCT 30.3 (L) 36.0 - 46.0 %   MCV 83.7 80.0 - 100.0 fL   MCH 26.8 26.0 - 34.0 pg   MCHC 32.0 30.0 - 36.0 g/dL   RDW 15.6 (H) 11.5 - 15.5 %   Platelets 127 (L) 150 -  400 K/uL   nRBC 0.0 0.0 - 0.2 %   Neutrophils Relative % 1 %   Neutro Abs 0.0 (L) 1.7 - 7.7 K/uL   Lymphocytes Relative 72 %   Lymphs Abs 0.6 (L) 0.7 - 4.0 K/uL   Monocytes Relative 25 %   Monocytes Absolute 0.2 0.1 - 1.0 K/uL   Eosinophils Relative 1 %   Eosinophils Absolute 0.0 0.0 - 0.5 K/uL   Basophils Relative 1 %   Basophils Absolute 0.0 0.0 - 0.1 K/uL   WBC Morphology ABSOLUTE LYMPHOCYTOSIS    Immature Granulocytes 0 %   Abs Immature Granulocytes 0.00 0.00 - 0.07 K/uL    Comment: Performed at [facility] [address] [place], [place] [address]  Lactate dehydrogenase     Status: None   Collection Time: 12/17/18  9:00 AM  Result Value Ref Range   LDH 119 98 - 192 U/L    Comment: Performed at [facility], [place] [address], [place], [place] [address]   Ct Soft Tissue Neck W Contrast  Result Date: 12/16/2018 CLINICAL DATA:  Initial evaluation for acute sore throat, stridor EXAM: CT NECK WITH CONTRAST TECHNIQUE: Multidetector CT imaging of the neck was performed using the standard protocol following the bolus administration of intravenous contrast. CONTRAST:  20mL OMNIPAQUE IOHEXOL 300 MG/ML  SOLN COMPARISON:  None. FINDINGS: Pharynx and larynx: Oral cavity within normal limits. Poor dentition with innumerable dental caries and periapical lucencies noted. No acute inflammatory changes seen about the teeth. Symmetric hypertrophy with hyperenhancement and edematous changes seen involving the palatine and lingual tonsils bilaterally, suggesting acute tonsillitis. Associated diffuse  mucosal edema seen throughout the adjacent oropharynx, extending inferiorly to involve the supraglottic larynx and hypopharynx, suggesting associated pharyngitis. There is involvement of the epiglottis which is thickened and edematous (series 6, image 82). Extension into the aryepiglottic folds. There is masslike thickening and edema of the hypopharynx at the level of the piriform sinuses, right greater than left (series 3, image 57). Secondary mild narrowing of the supraglottic airway which remains patent at this time. Glottis within normal limits inferiorly. Subglottic airway clear. No abscess or drainable fluid collection. Associated hazy inflammatory stranding and induration within the adjacent parapharyngeal space bilaterally, extending into the submandibular and submental region as well. Salivary glands: Salivary glands including the parotid and submandibular glands within normal limits. Thyroid: Unremarkable. Lymph nodes: Enlarged bilateral level II/III nodes, measuring up to 17 mm on the left and 14 mm on the right. Findings presumably reactive. Vascular: Normal intravascular enhancement seen throughout the neck. Moderate atherosclerotic change about the carotid bifurcations without high-grade stenosis. Both carotid artery systems somewhat medialized into the retropharyngeal space, greater on the left. Limited intracranial: Unremarkable. Visualized orbits: Unremarkable. Mastoids and visualized paranasal sinuses: Visualized paranasal sinuses are clear. Mastoid air cells and middle ear cavities well pneumatized and free of fluid. Skeleton: Mild to moderate cervical spondylolysis at C5-6 and C6-7. Upper chest: Visualized upper chest demonstrates no acute finding. Partially visualized lungs are grossly clear. Other: None. IMPRESSION: 1. Findings consistent with acute supraglottitis with involvement of the epiglottis and narrowing of the supraglottic airway. Patient felt to be at risk for airway compromise. No  abscess or drainable fluid collection. Associated mass-like thickening and edema of the hypopharynx at the level of the piriform sinuses, likely reflecting acute inflammation, although an underlying mass could also have this appearance. ENT follow-up with repeat imaging following convalescence to ensure these changes resolve is recommended. 2. Enlarged bilateral level  II/III adenopathy, likely reactive. 3. Poor dentition. Critical Value/emergent results were called by telephone at the time of interpretation on 12/16/2018 at 11:42 pm to providerBRIAN MILLER , who verbally acknowledged these results. Electronically Signed   By: Jeannine Boga M.D.   On: 12/16/2018 23:45   Dg Chest Portable 1 View  Result Date: 12/16/2018 CLINICAL DATA:  shortness of breath, sore throat and fever that started yesterday- has not been around anyone with covid that she is aware of. EXAM: PORTABLE CHEST 1 VIEW COMPARISON:  Chest radiograph 04/25/2018 FINDINGS: Stable cardiomediastinal contours with enlarged heart size. Small bandlike opacity at the right lung base favored to represent atelectasis or scarring. No focal infiltrate identified. No pneumothorax or pleural effusion. No acute finding in the visualized skeleton. IMPRESSION: Small bandlike opacity at the right lung base favored to represent atelectasis or scarring. No other acute finding. Electronically Signed   By: Audie Pinto M.D.   On: 12/16/2018 16:34    Pending Labs Unresulted Labs (From admission, onward)    Start     Ordered   12/16/18 2314  Miscellaneous [facility] test (send-out)  Add-on,   AD    Question:  Test name / description:  Answer:  levamisole, urine toxicology   12/16/18 2313   12/16/18 2303  Pathologist smear review  Once,   STAT     12/16/18 2302   12/16/18 2259  CMV IgM  Once,   STAT     12/16/18 2300   12/16/18 2257  Folate RBC  Once,   STAT     12/16/18 2300   12/16/18 2257  Epstein-Barr virus VCA, IgM  Once,   STAT     12/16/18  2300   12/16/18 2242  Antiextractable Nuclear Ag  Once,   STAT     12/16/18 2300   12/16/18 2236  Histone antibodies, IgG, blood  Once,   STAT     12/16/18 2300   12/16/18 2235  ANA w/Reflex if Positive  Once,   STAT     12/16/18 2300   12/16/18 2234  HIV Antibody (routine testing w rflx)  Once,   STAT     12/16/18 2300   12/16/18 2234  Anti-DNA antibody, double-stranded  Once,   STAT     12/16/18 2300   12/16/18 2234  Hepatitis c antibody (reflex)  Once,   STAT     12/16/18 2300   [id number]  CYCLIC CITRUL PEPTIDE ANTIBODY, IGG/IGA  Once,   STAT     12/16/18 2300   12/16/18 2234  Rheumatoid factor  Once,   STAT     12/16/18 2300   12/16/18 1743  SARS Coronavirus 2 [facility] [facility] order, Performed in [facility] hospital lab) Nasopharyngeal Nasopharyngeal Swab  (Symptomatic/High Risk of Exposure/Tier 1 Patients Labs with Precautions)  Once,   STAT    Question Answer Comment  Is this test for diagnosis or screening Diagnosis of ill patient   Symptomatic for COVID-19 as defined by CDC Yes   Date of Symptom Onset 12/15/2018   Hospitalized for COVID-19 Yes   Admitted to ICU for COVID-19 No   Previously tested for COVID-19 No   Resident in a congregate (group) care setting No   Employed in healthcare setting No   Pregnant No      12/16/18 1742   12/16/18 1648  Urine culture  ONCE - STAT,   STAT     12/16/18 1647          Vitals/Pain Today's  Vitals   12/17/18 1300 12/17/18 1315 12/17/18 1335 12/17/18 1345  BP: 117/88 (!) 133/93 121/84 120/87  Pulse: 75 80 72 76  Resp: 17 19 18 19   Temp:      TempSrc:      SpO2: 95% 95% 96% 95%  Weight:      Height:      PainSc:        Isolation Precautions No active isolations  Medications Medications  vancomycin (VANCOCIN) 1,750 mg in sodium chloride 0.9 % 500 mL IVPB (has no administration in time range)  lactated ringers infusion ( Intravenous Stopping Infusion hung by another clincian 12/17/18 1339)  amiodarone (PACERONE) tablet 200  mg (200 mg Oral Given 12/17/18 1100)  atorvastatin (LIPITOR) tablet 40 mg (40 mg Oral Given 12/17/18 0141)  cloNIDine (CATAPRES) tablet 0.1 mg (0.1 mg Oral Given 12/17/18 1059)  diltiazem (CARDIZEM CD) 24 hr capsule 180 mg (180 mg Oral Given 12/17/18 1059)  buPROPion (WELLBUTRIN SR) 12 hr tablet 150 mg (150 mg Oral Given 12/17/18 1059)  citalopram (CELEXA) tablet 40 mg (40 mg Oral Given 12/17/18 1058)  traZODone (DESYREL) tablet 150 mg (150 mg Oral Given 12/17/18 0143)  rivaroxaban (XARELTO) tablet 20 mg (has no administration in time range)  topiramate (TOPAMAX) tablet 50 mg (50 mg Oral Given 12/17/18 0143)  loratadine (CLARITIN) tablet 10 mg (has no administration in time range)  acetaminophen (TYLENOL) tablet 650 mg (has no administration in time range)    Or  acetaminophen (TYLENOL) suppository 650 mg (has no administration in time range)  ondansetron (ZOFRAN) tablet 4 mg (has no administration in time range)    Or  ondansetron (ZOFRAN) injection 4 mg (has no administration in time range)  ceFEPIme (MAXIPIME) 2 g in sodium chloride 0.9 % 100 mL IVPB (0 g Intravenous Stopped 12/17/18 0951)  HYDROmorphone (DILAUDID) injection 0.5 mg (has no administration in time range)  acetaminophen (TYLENOL) tablet 650 mg (650 mg Oral Given 12/16/18 1338)  sodium chloride 0.9 % bolus 1,000 mL (0 mLs Intravenous Stopped 12/16/18 1935)  ceFEPIme (MAXIPIME) 2 g in sodium chloride 0.9 % 100 mL IVPB (0 g Intravenous Stopped 12/16/18 1901)  vancomycin (VANCOCIN) 1,750 mg in sodium chloride 0.9 % 500 mL IVPB (0 mg Intravenous Stopped 12/17/18 0029)  acetaminophen (TYLENOL) tablet 1,000 mg (1,000 mg Oral Given 12/16/18 1928)  sodium chloride 0.9 % bolus 1,000 mL (0 mLs Intravenous Stopped 12/17/18 0144)  ondansetron (ZOFRAN) injection 4 mg (4 mg Intravenous Given 12/16/18 1938)  iohexol (OMNIPAQUE) 300 MG/ML solution 75 mL (75 mLs Intravenous Contrast Given 12/16/18 2310)  dexamethasone (DECADRON) injection 10 mg (10 mg  Intravenous Given 12/17/18 1325)    Mobility walks Moderate fall risk   Focused Assessments    R Recommendations: See Admitting Provider Note  Report given to:   Additional Notes:

## 2018-12-17 NOTE — ED Notes (Signed)
Admitting provider at bedside for evaluation.

## 2018-12-17 NOTE — Progress Notes (Signed)
Paged to bedside for increased somnolence. I presented to bedside for further evaluation. Patient previously A&O x4 and just recently received multiple sedating medications. She was snoring when I entered the room. She briefly aroused, opened her eyes but quickly fell back asleep to verbal and physical stimuli. When trying to open her eyes or move her arms she attempts to fight the action.   Today's Vitals   12/17/18 0145 12/17/18 0200 12/17/18 0215 12/17/18 0230  BP: (!) 148/129 115/72 97/65 95/64   Pulse: 91 89 88 83  Resp: (!) 25 (!) 25 (!) 22 (!) 25  Temp:      TempSrc:      SpO2: 99% 97% 97% 98%  Weight:      Height:      PainSc:       Body mass index is 30.99 kg/m.   She is currently hemodynamically stable and protecting her airway. I think this is 2/2 a combination of her trazodone and clonidine. If the patient were to have apnea or worsening mentation naloxone can be used for clonidine induced respiratory/CNS depression.  Ina Homes, MD  [facility] PGY 3  12/17/2018, 02:42AM

## 2018-12-17 NOTE — Progress Notes (Signed)
   Subjective: Patient reports that she has not had sore throat with prior episodes. She reports that she has improvement in her pain right now. She reports that her voice still seems abnormal. Reports improvement in ability to open mouth. She reports symptoms came on suddenly, felt fine 1 week ago.   Discussed results of CT scan. Discussed that we spoke with ENT and that she is currently on steroids.   Objective:  Vital signs in last 24 hours: Vitals:   12/17/18 0653 12/17/18 0700 12/17/18 0715 12/17/18 0917  BP: 121/78 111/82 121/77   Pulse: 81 77 80 77  Resp: 16 19 13 18   Temp:      TempSrc:      SpO2: 99% 97% 98% 98%  Weight:      Height:       Physical Exam Constitutional:      General: She is not in acute distress. HENT:     Head:     Comments: Submandibular lymphadenopathy    Mouth/Throat:     Mouth: Mucous membranes are dry.  Cardiovascular:     Rate and Rhythm: Normal rate and regular rhythm.  Lymphadenopathy:     Cervical: Cervical adenopathy present.  Skin:    General: Skin is warm and dry.  Neurological:     Mental Status: She is alert.     Assessment/Plan:  Active Problems:   Chronic diastolic heart failure ([name])   Head and neck lymphadenopathy   Febrile neutropenia ([name])  Morgan Bean is a 53 year old woman with a history of CVA, CAD, COPD, FF, paroxysmal A. fib on anticoagulant, CKD stage III, cocaine use and recurrent neutropenia who presents after 2 days of sore throat and fever.  Patient notable for trismus and a hoarse voice.  #Supraglottitis/epiglottitis CT neck notable for supraglottitis and epiglottitis with narrowing of her airway.  ENT was consulted and recommended antibiotics dexamethasone and pain management for drooling or stridor.  If patient has a decline ENT should be been contacted and will evaluate in person.  Patient says she can feel improvement this morning with treatment. -Continue vancomycin and cefepime -Continue dexamethasone 10  mg every 8 hours -Continue hydromorphone as needed for pain  #Neutropenic fever Patient has unclear etiology of her current neutropenia and has been evaluated previously by Dr. core/and received a second opinion [facility].  She was treated with steroids in the past with a good response. WBC 0.8 today Continues to have fever. Currently on vancomycin and cefepime. Doing autoimmune work up now.  - F/U EBV, CMV - F/u pathologist review of blood smear -Follow-up rheumatological work-up -Follow-up blood cultures  Diet: NPO, pending improvement . VTE ppx: Lovenox  Code Status: FULL   Dispo: Anticipated discharge is pending clinical improvement.   Tamsen Snider, MD PGY1  [phone]

## 2018-12-17 NOTE — Progress Notes (Signed)
Patient admitted to room 5w04 from ED. Alert oriented x4. Room air oxygen 99%. Complains of soreness in throat when swallowing and 3/10 at rest. Patient ambulates with walker with standby assistance. Admission completed and education provided.

## 2018-12-18 ENCOUNTER — Telehealth: Payer: Self-pay | Admitting: Internal Medicine

## 2018-12-18 DIAGNOSIS — I5032 Chronic diastolic (congestive) heart failure: Secondary | ICD-10-CM

## 2018-12-18 LAB — CMV IGM: CMV IgM: <30 AU/mL (ref 0.0–29.9)

## 2018-12-18 LAB — URINE CULTURE: Culture: NO GROWTH

## 2018-12-18 LAB — EPSTEIN-BARR VIRUS VCA, IGM: EBV VCA IgM: <36 U/mL (ref 0.0–35.9)

## 2018-12-18 LAB — RHEUMATOID FACTOR: Rheumatoid fact SerPl-aCnc: 26.4 IU/mL — ABNORMAL HIGH (ref 0.0–13.9)

## 2018-12-18 MED ORDER — SODIUM CHLORIDE 0.9 % IV BOLUS
500.0000 mL | Freq: Once | INTRAVENOUS | Status: AC
  Administered 2018-12-18: 500 mL via INTRAVENOUS

## 2018-12-18 MED ORDER — SODIUM CHLORIDE 0.9 % IV SOLN
2.0000 g | Freq: Three times a day (TID) | INTRAVENOUS | Status: DC
  Administered 2018-12-18 – 2018-12-22 (×14): 2 g via INTRAVENOUS
  Filled 2018-12-18 (×14): qty 2

## 2018-12-18 MED ORDER — SODIUM CHLORIDE 0.9 % IV SOLN
INTRAVENOUS | Status: DC | PRN
  Administered 2018-12-18: 1000 mL via INTRAVENOUS
  Administered 2018-12-19: 250 mL via INTRAVENOUS

## 2018-12-18 NOTE — Evaluation (Signed)
Clinical/Bedside Swallow Evaluation Patient Details  Name: Morgan Bean MRN: [id number] Date of Birth: 03/13/1966  Today's Date: 12/18/2018 Time: SLP Start Time (ACUTE ONLY): 1425 SLP Stop Time (ACUTE ONLY): 1433 SLP Time Calculation (min) (ACUTE ONLY): 8 min  Past Medical History:  Past Medical History:  Diagnosis Date  . Anemia of other chronic disease 11/13/2013  . Anginal pain ([place])    none in past year  . Anxiety   . Asthma   . Candidiasis 06/23/2013  . Chronic diastolic CHF (congestive heart failure) ([place])   . Constipation   . Coronary artery disease    Lexiscan myoview (10/12) with significant ST depression upon Lexiscan injection but no ischemia or infarction on perfusion images. Left heart cath (10/12): 30% mid LAD, 30% ostial D1, 50% ostial D2.    [name] Cough 11/13/2013  . DCIS (ductal carcinoma in situ) of breast   . Depression   . Headache 02/22/2014  . Hx of cardiovascular stress test    Lexiscan Myoview (2/16): Breast attenuation artifact, no ischemia, EF 64%; low risk  . Hyperlipemia   . Hypertension   . Iron deficiency    hx of  . Leukocytoclastic vasculitis ([place])    Rash across lower body, occurred in 9/11, diagnosed by skin biopsy. ANA positive. Thought to be secondary to cocaine use.  . Leukopenia 03/21/2013  . Lymphadenopathy 03/21/2013  . Lymphadenopathy 01/30/2014  . Mental disorder   . Pancytopenia ([place])   . Pancytopenia, acquired ([place]) 07/16/2015  . Polysubstance abuse ([place])    Bean cocaine  . Pulmonary HTN ([place])    Echo (9/11) with EF 65%, mild LVH, mild AI, mild MR, moderate to possibly severe TR with PA systolic pressure 52 mmHg. Echo (10/12): severe LV hypertrophy, EF 60-65%, mild MR, mild AI, moderate to severe tricuspid regurgitation, PA systolic pressure 38 mmHg.  Echo 4/14: moderate LVH, EF 65%, normal wall motion, diastolic dysfunction, mild AI, mild MR  . Shortness of breath    a. PFTs 5/14:  FEV1/FVC 99% predicted; FVC 60% predicted; DLCO mildly  reduced, mild restriction, air trapping.;  b.  seen by pulmo (Dr. [name]) 5/14: mainly upper airway symptoms - ACE d/c'd (sx's better)  . Stroke ([place]) 2010ish  . Syphilis 04/25/2013  . Tobacco abuse   . Tricuspid regurgitation   . Unspecified deficiency anemia 03/29/2013   Past Surgical History:  Past Surgical History:  Procedure Laterality Date  . ANKLE SURGERY     right  . BONE MARROW ASPIRATION    . CARDIAC CATHETERIZATION    . CARDIAC CATHETERIZATION N/A 04/23/2015   Procedure: Right Heart Cath;  Surgeon: [name], MD;  Location: [place] CV LAB;  Service: Cardiovascular;  Laterality: N/A;  . CARDIOVERSION N/A 03/11/2018   Procedure: CARDIOVERSION;  Surgeon: [name], MD;  Location: [facility] ENDOSCOPY;  Service: Cardiovascular;  Laterality: N/A;  . I&D EXTREMITY  08/09/2011   Procedure: IRRIGATION AND DEBRIDEMENT EXTREMITY;  Surgeon: [name], MD;  Location: [place];  Service: General;  Laterality: Left;  i & D Left axilla abscess  . LYMPH NODE BIOPSY N/A 04/05/2013   Procedure: EXCISIONAL BIOPSY RIGHT SUBMANDIBULAR NODE, NASAL ENDOSCOPIC WITH BIOPSY NASAL PHARYNX;  Surgeon: [name], MD;  Location: [place];  Service: ENT;  Laterality: N/A;  . TEE WITHOUT CARDIOVERSION N/A 03/11/2018   Procedure: TRANSESOPHAGEAL ECHOCARDIOGRAM (TEE);  Surgeon: [name], MD;  Location: [place];  Service: Cardiovascular;  Laterality: N/A;   HPI:  Morgan Bean is  a 53 y.o female with hypertension, hyperlipidemia, CVA, CAD, COPD, CHF, and anemia/neutropenia who presented to the ED with a sore throat of 2 days duration. The patient was in her usual state of health until 2 days ago when she acutely developed sore throat, myalgias, arthralgias, fevers, chills, productive cough (thick and yellow), SHOB, and lack of appetite. She has noticed a change in her voice, bilateral neck pain, and trismus. Her symptoms have caused so much discomfort that she has been unable to sleep. CXR 9/25  suggestive of atalectasis or scarring.   Assessment / Plan / Recommendation Clinical Impression  Pt presents with mild oral dysphagia, likey 2/2 dental status.  There was prolonged oral phase with regular solids and pt required liquid wash.  With soft solid texture oral phase was more efficient with good clearance noted.  Pt tolerated thin liquid and puree with no clinical s/s of aspiration.  With regular solid there was a delayed throat clear, but pt denies this being related to PO trials.  Pt endorses consuming regular diet at baseline.  Discussed diet preferences, pt agreeable to mechanical soft texture at present.  Pt notes that her vocal quality has improved and she feels it has returned to baseline.  Pt endorses some swelling in throat.  Recommend mechanical soft diet with thin liquids. SLP Visit Diagnosis: Dysphagia, unspecified (R13.10)    Aspiration Risk  Mild aspiration risk    Diet Recommendation Dysphagia 3 (Mech soft);Thin liquid   Liquid Administration via: Cup;Straw Medication Administration: (No specific precautions) Supervision: Patient able to self feed Compensations: Slow rate;Small sips/bites Postural Changes: Seated upright at 90 degrees    Other  Recommendations Oral Care Recommendations: Oral care BID   Follow up Recommendations        Frequency and Duration min 2x/week  2 weeks       Prognosis Prognosis for Safe Diet Advancement: Good      Swallow Study   General Date of Onset: 12/16/18 HPI: Morgan Bean is a 53 y.o female with hypertension, hyperlipidemia, CVA, CAD, COPD, CHF, and anemia/neutropenia who presented to the ED with a sore throat of 2 days duration. The patient was in her usual state of health until 2 days ago when she acutely developed sore throat, myalgias, arthralgias, fevers, chills, productive cough (thick and yellow), SHOB, and lack of appetite. She has noticed a change in her voice, bilateral neck pain, and trismus. Her symptoms have  caused so much discomfort that she has been unable to sleep. CXR 9/25 suggestive of atalectasis or scarring. Type of Study: Bedside Swallow Evaluation Previous Swallow Assessment: none Diet Bean to this Study: Thin liquids Temperature Spikes Noted: No Respiratory Status: Room air Behavior/Cognition: Alert;Cooperative;Pleasant mood Oral Cavity Assessment: Within Functional Limits Oral Care Completed by SLP: No Oral Cavity - Dentition: Missing dentition;Poor condition Self-Feeding Abilities: Able to feed self Patient Positioning: Upright in bed Baseline Vocal Quality: Normal Volitional Swallow: Able to elicit    Oral/Motor/Sensory Function Overall Oral Motor/Sensory Function: Mild impairment Facial ROM: Within Functional Limits Facial Symmetry: Within Functional Limits Lingual ROM: Reduced right;Reduced left Lingual Symmetry: Within Functional Limits Lingual Strength: Within Functional Limits Mandible: Within Functional Limits   Ice Chips Ice chips: Not tested   Thin Liquid Thin Liquid: Within functional limits Presentation: Cup;Straw    Nectar Thick Nectar Thick Liquid: Not tested   Honey Thick Honey Thick Liquid: Not tested   Puree Puree: Within functional limits Presentation: Spoon   Solid     Solid: Impaired Oral  Phase Functional Implications: Prolonged oral transit;Impaired mastication      Celedonio Savage, MA, [place] Office: [phone] 12/18/2018,2:49 PM

## 2018-12-18 NOTE — Progress Notes (Addendum)
Paged to the patient's room regarding hypotension.  Patient's blood pressures have been roughly around the 80s/60s.  Patient states that she feels dizzy when she stands up, but does not have any other symptoms at this time such as chest pain, shortness of breath, changes in vision, nausea.  She states that she has been having intermittent palpitations throughout the day, which she believes is due to her atrial fibrillation.  Patient's heart and lung sounds are within normal limits.  Vitals:   12/18/18 2003 12/18/18 2015  BP: (!) 87/71   Pulse: (!) 103 78  Resp: 16 16  Temp:    SpO2: 100% 97%    Plan: -NS 1000 cc bolus - Hold Clonidine PM -EKG showing atrial fibrillation. -We will replete her blood pressure when she has been given some fluids\  Marianna Payment, D.O. PGY-1, Internal Medicine Residency

## 2018-12-18 NOTE — Progress Notes (Signed)
Pharmacy Antibiotic Note  Morgan Bean is a 53 y.o. female admitted on 12/16/2018 with neutropenic fever. PMH significant for pancytopenia. Febrile upon ED arrival with Tmax 103.1, WBC 1.5, ANC <0.1. Pharmacy has been consulted for vancomycin and cefepime dosing.  Patient has been afebrile since 9/25 at 2000 and [place] is 0. Serum creatinine has improved from 1.44 to 1.16. Current vancomycin regimen of 1750mg  IV q48h recalculated with Scr of 1.16 estimates an AUC of 421 which is at goal (AUC goal 400-550). Will continue current vancomycin regimen and monitor renal function. Cefepime 2g IV q12h is dosed for CrCl 30-60 mL/min and with improvement in Scr the patient's CrCl is >60 mL/min. Will dose adjust cefepime.   Plan: Increase cefepime to 2g IV q8h Continue vancomycin 1750 mg IV q48h  Monitor clinical improvement and renal function Monitor C/s  Height: 5\' 6"  (167.6 cm) Weight: 194 lb 14.2 oz (88.4 kg) IBW/kg (Calculated) : 59.3  Temp (24hrs), Avg:98.2 F (36.8 C), Min:98.1 F (36.7 C), Max:98.4 F (36.9 C)  Recent Labs  Lab 12/16/18 1335 12/17/18 0351  WBC 1.5* 0.8*  CREATININE 1.44* 1.16*  LATICACIDVEN 1.0  --     Estimated Creatinine Clearance: 61.3 mL/min (A) (by C-G formula based on SCr of 1.16 mg/dL (H)).    Allergies  Allergen Reactions  . Ciprofloxacin Itching    Antimicrobials this admission: Cefepime 9/25 >> Vancomycin 9/25 >>  Dose Adjustments: 9/27: cefepime 2g IV q12h increased to cefepime 2g IV q8h due to improvement in renal function.   Microbiology results: 9/25 BCx: no growth <24 hrs  9/25 UCx: pending 9/25 COVID: negative   Thank you for allowing pharmacy to be a part of this patient's care.  Cristela Felt, PharmD PGY1 Pharmacy Resident [employer]: [phone]

## 2018-12-18 NOTE — Progress Notes (Signed)
Bp 80s/60s and complaint of dizziness with movement. MD ordered 544ml NS bolus over 45mins. Oncoming nurse updated.

## 2018-12-18 NOTE — Progress Notes (Signed)
BP 92,64, HR 74. EKG done. IV NS bolus done. Marianna Payment, MD notified via text page.

## 2018-12-18 NOTE — Progress Notes (Signed)
   Subjective: Reports that she is doing a lot better today she does report she is still having some mild pain with swallowing. She has been able to swallow water and her medications with no issues. Denies any difficulty with her swallowing, reports improvement in her ear hoarseness and her ability to open her mouth.  Discussed her current reason for admission.  Objective:  Vital signs in last 24 hours: Vitals:   12/17/18 2019 12/17/18 2020 12/18/18 0003 12/18/18 0403  BP:  (!) 114/92 106/89 113/85  Pulse:  71 (!) 102 100  Resp:  14  16  Temp: 98.2 F (36.8 C)  98.1 F (36.7 C) 98.1 F (36.7 C)  TempSrc: Oral  Oral Oral  SpO2:  98% 97% 98%  Weight:      Height:       Physical Exam Vitals signs reviewed.  Constitutional:      General: She is not in acute distress. HENT:     Head:     Comments: Submandibular lymphadenopathy    Mouth/Throat:     Mouth: Mucous membranes are dry.  Cardiovascular:     Rate and Rhythm: Normal rate and regular rhythm.  Lymphadenopathy:     Cervical: Cervical adenopathy present.  Skin:    General: Skin is warm.  Neurological:     Mental Status: She is alert.     Assessment/Plan:  Principal Problem:   Febrile neutropenia ([name]) Active Problems:   Essential hypertension   Chronic diastolic heart failure ([name])   History of cocaine abuse ([name])   Pancytopenia, acquired ([name])   Head and neck lymphadenopathy   PAF (paroxysmal atrial fibrillation) ([name])   CRI (chronic renal insufficiency), stage 3 (moderate) ([name])  Morgan Bean is a 53 year old woman with a history of CVA, CAD, COPD, FF, paroxysmal A. fib on anticoagulant, CKD stage III, cocaine use and recurrent neutropenia who presents after 2 days of sore throat and fever.  Exam notable for trismus and a hoarse voice.  #Supraglottitis/epiglottitis CT neck notable for supraglottitis and epiglottitis with narrowing of her airway.  ENT was consulted and recommended antibiotics, dexamethasone and  pain management for drooling or stridor.  If patient has a decline ENT should be been contacted and will evaluate in person. Today patient reports significant improvement, she is able to open her mouth more and has been swallowing liquids and pills with no issues. She also has improvement in her hoarseness.   -Continue vancomycin and cefepime -Continue dexamethasone 10 mg every 8 hours -Continue hydromorphone as needed for pain -Given improvement will start FLD, will need SLP eval  #Neutropenic fever Patient has unclear etiology of her current neutropenia and has been evaluated previously by Dr. core/and received a second opinion [facility].  She was treated with steroids in the past with a good response. WBC 0.8 on admission. Patient has not had a fever overnight. Unclear cause of her neutropenia, will need to follow up with Dr. Eduard Clos for further evaluation. Doing autoimmune work up now.   - F/U EBV, CMV - F/u pathologist review of blood smear -Follow-up rheumatological work-up -Follow-up blood cultures -CBC in AM  Diet: Full liquid VTE ppx: Lovenox  Code Status: FULL   Dispo: Anticipated discharge is pending clinical improvement.    Asencion Noble, M.D. PGY2 Pager [phone] 12/18/2018 2:46 PM

## 2018-12-19 ENCOUNTER — Other Ambulatory Visit: Payer: Self-pay

## 2018-12-19 LAB — CBC WITH DIFFERENTIAL/PLATELET
Abs Immature Granulocytes: 0 10*3/uL (ref 0.00–0.07)
Basophils Absolute: 0 10*3/uL (ref 0.0–0.1)
Basophils Relative: 0 %
Eosinophils Absolute: 0 10*3/uL (ref 0.0–0.5)
Eosinophils Relative: 0 %
HCT: 31.1 % — ABNORMAL LOW (ref 36.0–46.0)
Hemoglobin: 10.2 g/dL — ABNORMAL LOW (ref 12.0–15.0)
Immature Granulocytes: 0 %
Lymphocytes Relative: 89 %
Lymphs Abs: 1.8 10*3/uL (ref 0.7–4.0)
MCH: 26.8 pg (ref 26.0–34.0)
MCHC: 32.8 g/dL (ref 30.0–36.0)
MCV: 81.8 fL (ref 80.0–100.0)
Monocytes Absolute: 0.2 10*3/uL (ref 0.1–1.0)
Monocytes Relative: 10 %
Neutro Abs: 0 10*3/uL — ABNORMAL LOW (ref 1.7–7.7)
Neutrophils Relative %: 1 %
Platelets: 183 10*3/uL (ref 150–400)
RBC: 3.8 MIL/uL — ABNORMAL LOW (ref 3.87–5.11)
RDW: 15.7 % — ABNORMAL HIGH (ref 11.5–15.5)
WBC: 2 10*3/uL — ABNORMAL LOW (ref 4.0–10.5)
nRBC: 0 % (ref 0.0–0.2)

## 2018-12-19 LAB — ANA W/REFLEX IF POSITIVE: Anti Nuclear Antibody (ANA): POSITIVE — AB

## 2018-12-19 LAB — ENA+DNA/DS+ANTICH+CENTRO+JO...
Anti JO-1: <0.2 AI (ref 0.0–0.9)
Centromere Ab Screen: <0.2 AI (ref 0.0–0.9)
Chromatin Ab SerPl-aCnc: 0.2 AI (ref 0.0–0.9)
ENA SM Ab Ser-aCnc: <0.2 AI (ref 0.0–0.9)
Ribonucleic Protein: 3.4 AI — ABNORMAL HIGH (ref 0.0–0.9)
SSA (Ro) (ENA) Antibody, IgG: <0.2 AI (ref 0.0–0.9)
SSB (La) (ENA) Antibody, IgG: <0.2 AI (ref 0.0–0.9)
Scleroderma (Scl-70) (ENA) Antibody, IgG: <0.2 AI (ref 0.0–0.9)
ds DNA Ab: 1 IU/mL (ref 0–9)

## 2018-12-19 LAB — FOLATE RBC
Folate, Hemolysate: 278 ng/mL
Folate, RBC: 906 ng/mL (ref >498)
Hematocrit: 30.7 % — ABNORMAL LOW (ref 34.0–46.6)

## 2018-12-19 LAB — HEPATITIS C ANTIBODY (REFLEX): HCV Ab: 0.3 s/co ratio (ref 0.0–0.9)

## 2018-12-19 LAB — BASIC METABOLIC PANEL
Anion gap: 4 — ABNORMAL LOW (ref 5–15)
BUN: 32 mg/dL — ABNORMAL HIGH (ref 6–20)
CO2: 21 mmol/L — ABNORMAL LOW (ref 22–32)
Calcium: 8.4 mg/dL — ABNORMAL LOW (ref 8.9–10.3)
Chloride: 111 mmol/L (ref 98–111)
Creatinine, Ser: 1.38 mg/dL — ABNORMAL HIGH (ref 0.44–1.00)
GFR calc Af Amer: 50 mL/min — ABNORMAL LOW (ref >60)
GFR calc non Af Amer: 43 mL/min — ABNORMAL LOW (ref >60)
Glucose, Bld: 123 mg/dL — ABNORMAL HIGH (ref 70–99)
Potassium: 4.6 mmol/L (ref 3.5–5.1)
Sodium: 136 mmol/L (ref 135–145)

## 2018-12-19 LAB — MRSA PCR SCREENING: MRSA by PCR: NEGATIVE

## 2018-12-19 LAB — ANTIEXTRACTABLE NUCLEAR AG
ENA SM Ab Ser-aCnc: <0.2 AI (ref 0.0–0.9)
Ribonucleic Protein: 3.3 AI — ABNORMAL HIGH (ref 0.0–0.9)

## 2018-12-19 LAB — ANTI-DNA ANTIBODY, DOUBLE-STRANDED: ds DNA Ab: 3 IU/mL (ref 0–9)

## 2018-12-19 LAB — HCV COMMENT:

## 2018-12-19 NOTE — Progress Notes (Signed)
   Subjective:  Patient reports that she is doing better today, her throat and voice continues to improve since she has been admitted. Patient called daughter, spoke with her and patient. Discussed the lab results today, some improvement today in her white count.   She reports that she had very low energy yesterday, she got some fluids last night for low blood pressures which she reports helped.   Objective:  Vital signs in last 24 hours: Vitals:   12/19/18 0417 12/19/18 0419 12/19/18 0442 12/19/18 0535  BP:  92/76 (!) 89/63 97/73  Pulse:  66 64 73  Resp:  14 15 17   Temp: 98.1 F (36.7 C)   98 F (36.7 C)  TempSrc: Oral   Oral  SpO2:  100% 95% 98%  Weight:      Height:       Physical Exam Vitals signs reviewed.  Constitutional:      General: She is not in acute distress. HENT:     Head:     Comments: Submandibular lymphadenopathy    Mouth/Throat:     Mouth: Mucous membranes are dry.  Cardiovascular:     Rate and Rhythm: Normal rate and regular rhythm.  Lymphadenopathy:     Cervical: Cervical adenopathy present.  Skin:    General: Skin is warm.  Neurological:     Mental Status: She is alert.     Assessment/Plan:  Principal Problem:   Febrile neutropenia ([name]) Active Problems:   Essential hypertension   Chronic diastolic heart failure ([name])   History of cocaine abuse ([name])   Pancytopenia, acquired ([name])   Head and neck lymphadenopathy   PAF (paroxysmal atrial fibrillation) ([name])   CRI (chronic renal insufficiency), stage 3 (moderate) ([name])  Morgan Bean is a 53 year old woman with a history of CVA, CAD, COPD, FF, paroxysmal A. fib on anticoagulant, CKD stage III, cocaine use and recurrent neutropenia who presents after 2 days of sore throat and fever.  Exam notable for trismus and a hoarse voice.  #Supraglottitis/epiglottitis CT neck notable for supraglottitis and epiglottitis with narrowing of her airway.  ENT was consulted and recommended antibiotics,  dexamethasone and pain management for drooling or stridor.  If patient has a decline ENT should be been contacted and will evaluate in person. Today patient reports significant improvement, she is able to open her mouth more and has been swallowing liquids and pills with no issues. She also has improvement in her hoarseness.   -Continue vancomycin and cefepime -Continue hydromorphone as needed for pain -Given improvement will start FLD, will need SLP eval  #Neutropenic fever Patient has unclear etiology of her current neutropenia and has been evaluated previously by Dr.Gorsuch and received a second opinion at [facility].  She was treated with steroids in the past with a good response. WBC 0.8 on admission. Patient has not had a fever overnight and WBC improving. Unclear cause of her neutropenia.  Doing autoimmune work . EBV and CMV neg.  Dr.Gorsuch will have her clinic call patient to schedule appointment. - Dr. Alvy Bimler recommends 7 day course of antibiotics and follow-up within a week of discharge.  - F/u pathologist review of blood smear -Follow-up rheumatological work-up -Follow-up blood cultures -CBC in AM  Diet: Full liquid VTE ppx: Lovenox  Code Status: FULL   Dispo: Anticipated discharge is pending clinical improvement.    Tamsen Snider, MD PGY1  [phone]

## 2018-12-19 NOTE — Plan of Care (Signed)
  Problem: Education: Goal: Knowledge of General Education information will improve Description: Including pain rating scale, medication(s)/side effects and non-pharmacologic comfort measures 12/19/2018 1612 by Toni Amend, RN Outcome: Progressing 12/19/2018 1611 by Toni Amend, RN Outcome: Progressing   Problem: Health Behavior/Discharge Planning: Goal: Ability to manage health-related needs will improve 12/19/2018 1612 by Toni Amend, RN Outcome: Progressing 12/19/2018 1611 by Toni Amend, RN Outcome: Progressing   Problem: Clinical Measurements: Goal: Ability to maintain clinical measurements within normal limits will improve 12/19/2018 1612 by Toni Amend, RN Outcome: Progressing 12/19/2018 1611 by Toni Amend, RN Outcome: Progressing Goal: Will remain free from infection 12/19/2018 1612 by Toni Amend, RN Outcome: Progressing 12/19/2018 1611 by Toni Amend, RN Outcome: Progressing Goal: Diagnostic test results will improve 12/19/2018 1612 by Toni Amend, RN Outcome: Progressing 12/19/2018 1611 by Toni Amend, RN Outcome: Progressing   Problem: Safety: Goal: Ability to remain free from injury will improve 12/19/2018 1612 by Toni Amend, RN Outcome: Progressing 12/19/2018 1611 by Toni Amend, RN Outcome: Progressing   Problem: Education: Goal: Ability to demonstrate management of disease process will improve 12/19/2018 1612 by Toni Amend, RN Outcome: Progressing 12/19/2018 1611 by Toni Amend, RN Outcome: Progressing Goal: Ability to verbalize understanding of medication therapies will improve 12/19/2018 1612 by Toni Amend, RN Outcome: Progressing 12/19/2018 1611 by Toni Amend, RN Outcome: Progressing Goal: Individualized Educational Video(s) 12/19/2018 1612 by Toni Amend, RN Outcome: Progressing 12/19/2018 1611 by Toni Amend, RN Outcome: Progressing   Problem:  Activity: Goal: Capacity to carry out activities will improve 12/19/2018 1612 by Toni Amend, RN Outcome: Progressing 12/19/2018 1611 by Toni Amend, RN Outcome: Progressing   Problem: Cardiac: Goal: Ability to achieve and maintain adequate cardiopulmonary perfusion will improve 12/19/2018 1612 by Toni Amend, RN Outcome: Progressing 12/19/2018 1611 by Toni Amend, RN Outcome: Progressing

## 2018-12-20 ENCOUNTER — Telehealth: Payer: Self-pay | Admitting: Hematology and Oncology

## 2018-12-20 ENCOUNTER — Inpatient Hospital Stay (HOSPITAL_COMMUNITY): Payer: Medicaid Other

## 2018-12-20 ENCOUNTER — Inpatient Hospital Stay (HOSPITAL_COMMUNITY): Admission: RE | Admit: 2018-12-20 | Payer: Medicaid Other | Source: Ambulatory Visit

## 2018-12-20 LAB — CBC WITH DIFFERENTIAL/PLATELET
Abs Immature Granulocytes: 0 10*3/uL (ref 0.00–0.07)
Basophils Absolute: 0 10*3/uL (ref 0.0–0.1)
Basophils Relative: 0 %
Eosinophils Absolute: 0 10*3/uL (ref 0.0–0.5)
Eosinophils Relative: 1 %
HCT: 32.6 % — ABNORMAL LOW (ref 36.0–46.0)
Hemoglobin: 10.4 g/dL — ABNORMAL LOW (ref 12.0–15.0)
Immature Granulocytes: 0 %
Lymphocytes Relative: 93 %
Lymphs Abs: 3.3 10*3/uL (ref 0.7–4.0)
MCH: 26.5 pg (ref 26.0–34.0)
MCHC: 31.9 g/dL (ref 30.0–36.0)
MCV: 83 fL (ref 80.0–100.0)
Monocytes Absolute: 0.2 10*3/uL (ref 0.1–1.0)
Monocytes Relative: 6 %
Neutro Abs: 0 10*3/uL — ABNORMAL LOW (ref 1.7–7.7)
Neutrophils Relative %: 0 %
Platelets: 151 10*3/uL (ref 150–400)
RBC: 3.93 MIL/uL (ref 3.87–5.11)
RDW: 15.9 % — ABNORMAL HIGH (ref 11.5–15.5)
WBC: 3.6 10*3/uL — ABNORMAL LOW (ref 4.0–10.5)
nRBC: 0 % (ref 0.0–0.2)

## 2018-12-20 LAB — CBC
HCT: 32 % — ABNORMAL LOW (ref 36.0–46.0)
Hemoglobin: 10.3 g/dL — ABNORMAL LOW (ref 12.0–15.0)
MCH: 26.5 pg (ref 26.0–34.0)
MCHC: 32.2 g/dL (ref 30.0–36.0)
MCV: 82.5 fL (ref 80.0–100.0)
Platelets: 192 10*3/uL (ref 150–400)
RBC: 3.88 MIL/uL (ref 3.87–5.11)
RDW: 16.1 % — ABNORMAL HIGH (ref 11.5–15.5)
WBC: 3.5 10*3/uL — ABNORMAL LOW (ref 4.0–10.5)
nRBC: 0 % (ref 0.0–0.2)

## 2018-12-20 LAB — BASIC METABOLIC PANEL
Anion gap: 5 (ref 5–15)
BUN: 23 mg/dL — ABNORMAL HIGH (ref 6–20)
CO2: 22 mmol/L (ref 22–32)
Calcium: 8.3 mg/dL — ABNORMAL LOW (ref 8.9–10.3)
Chloride: 112 mmol/L — ABNORMAL HIGH (ref 98–111)
Creatinine, Ser: 1.32 mg/dL — ABNORMAL HIGH (ref 0.44–1.00)
GFR calc Af Amer: 53 mL/min — ABNORMAL LOW (ref >60)
GFR calc non Af Amer: 45 mL/min — ABNORMAL LOW (ref >60)
Glucose, Bld: 102 mg/dL — ABNORMAL HIGH (ref 70–99)
Potassium: 4.1 mmol/L (ref 3.5–5.1)
Sodium: 139 mmol/L (ref 135–145)

## 2018-12-20 LAB — PATHOLOGIST SMEAR REVIEW

## 2018-12-20 LAB — CYCLIC CITRUL PEPTIDE ANTIBODY, IGG/IGA: CCP Antibodies IgG/IgA: 4 units (ref 0–19)

## 2018-12-20 MED ORDER — IOHEXOL 300 MG/ML  SOLN
75.0000 mL | Freq: Once | INTRAMUSCULAR | Status: AC | PRN
  Administered 2018-12-20: 75 mL via INTRAVENOUS

## 2018-12-20 MED ORDER — DEXAMETHASONE SODIUM PHOSPHATE 10 MG/ML IJ SOLN
10.0000 mg | Freq: Three times a day (TID) | INTRAMUSCULAR | Status: DC
  Administered 2018-12-20 – 2018-12-22 (×6): 10 mg via INTRAVENOUS
  Filled 2018-12-20 (×6): qty 1

## 2018-12-20 NOTE — Plan of Care (Signed)
  Problem: Education: Goal: Knowledge of General Education information will improve Description: Including pain rating scale, medication(s)/side effects and non-pharmacologic comfort measures Outcome: Progressing   Problem: Health Behavior/Discharge Planning: Goal: Ability to manage health-related needs will improve Outcome: Progressing   Problem: Clinical Measurements: Goal: Ability to maintain clinical measurements within normal limits will improve Outcome: Progressing Goal: Will remain free from infection Outcome: Progressing Goal: Diagnostic test results will improve Outcome: Progressing   Problem: Safety: Goal: Ability to remain free from injury will improve Outcome: Progressing   Problem: Education: Goal: Ability to demonstrate management of disease process will improve Outcome: Progressing Goal: Ability to verbalize understanding of medication therapies will improve Outcome: Progressing Goal: Individualized Educational Video(s) Outcome: Progressing   Problem: Activity: Goal: Capacity to carry out activities will improve Outcome: Progressing   Problem: Cardiac: Goal: Ability to achieve and maintain adequate cardiopulmonary perfusion will improve Outcome: Progressing

## 2018-12-20 NOTE — Progress Notes (Signed)
   Subjective:   Patient reports hoarse voice has returned overnight. Denies any problem swallowing , difficulty opening mouth, or difficulty breathing. Plans for CT image and restarting dexamethasone are discussed. Patient made aware Dr.Gorsuch's office will setup outpatient follow-up for neutropenia.   Objective:  Vital signs in last 24 hours: Vitals:   12/19/18 2036 12/20/18 0000 12/20/18 0422 12/20/18 0424  BP:  115/89 (!) 124/99   Pulse:  75 92   Resp:  18 18   Temp: 98 F (36.7 C) 98.5 F (36.9 C)  97.8 F (36.6 C)  TempSrc: Oral Oral    SpO2:  96% 95%   Weight:      Height:       Physical Exam Vitals signs reviewed.  Constitutional:      General: She is not in acute distress. HENT:     Head:     Comments: Submandibular lymphadenopathy    Mouth/Throat:     Mouth: Mucous membranes are dry.  Cardiovascular:     Rate and Rhythm: Tachycardia present. Rhythm irregular.  Lymphadenopathy:     Cervical: Cervical adenopathy present.  Skin:    General: Skin is warm.  Neurological:     Mental Status: She is alert.     Assessment/Plan:  Principal Problem:   Febrile neutropenia ([name]) Active Problems:   Essential hypertension   Chronic diastolic heart failure ([name])   History of cocaine abuse ([name])   Pancytopenia, acquired ([name])   Head and neck lymphadenopathy   PAF (paroxysmal atrial fibrillation) ([name])   CRI (chronic renal insufficiency), stage 3 (moderate) ([name])  Morgan Bean is a 53 year old woman with a history of CVA, CAD, COPD, FF, paroxysmal A. fib on anticoagulant, CKD stage III, cocaine use and recurrent neutropenia who presents after 2 days of sore throat and fever.  Exam notable for trismus and a hoarse voice.  #Supraglottitis/epiglottitis CT neck notable for supraglottitis and epiglottitis with narrowing of her airway.  ENT was consulted and recommended antibiotics, dexamethasone and pain management for drooling or stridor. Patient had significant  improvement and on 9/28 she reported feeling back to 80%. Appreciated improvement on exam. Last dose of dexamethasone ~1300 on 9/26. On exam this morning patient had "hot potatoe voice" and increased swelling was appreciated.  -Continue vancomycin and cefepime -Continue hydromorphone as needed for pain -Consult ENT with reoccurrence - F/u CT results - Start dexamethasone  #Neutropenic fever Patient has unclear etiology of her current neutropenia and has been evaluated previously by Dr.Gorsuch and received a second opinion at [facility].  She was treated with steroids in the past with a good response. WBC 0.8 on admission. Patient has not had a fever overnight and WBC improving. Unclear cause of her neutropenia.  Doing autoimmune work . EBV and CMV neg.  Dr.Gorsuch will have her clinic call patient to schedule appointment. Positive ANA and rheumatoid factor. Ribonucleic protein positive. CCP negative. Follow up the remainder of workup.  - Dr. Alvy Bimler recommends 7 day course of antibiotics and follow-up within a week of discharge.  - F/u pathologist review of blood smear -Follow-up rheumatological work-up -Follow-up blood cultures (NGTD 4 days) - Follow up levamisole lab -CBC w/diff  in AM  Diet: Full liquid VTE ppx: Lovenox  Code Status: FULL   Dispo: Anticipated discharge is pending clinical improvement.    Tamsen Snider, MD PGY1  [phone]

## 2018-12-20 NOTE — Telephone Encounter (Signed)
Scheduled appt per 9/28 sch message - unable to reach pt . Left message with appt date and time  

## 2018-12-20 NOTE — Progress Notes (Signed)
  Speech Language Pathology Treatment: Dysphagia  Patient Details Name: Morgan Bean MRN: [id number] DOB: [birth year] Today's Date: 12/20/2018 Time: 9326-7124 SLP Time Calculation (min) (ACUTE ONLY): 10 min  Assessment / Plan / Recommendation Clinical Impression  Pt seen with am meal. Slow mastication noted due to missing dentition. Pt takes a small sip after each bite without coughing. She did have a large cough following a large sip of water to take a pill with RN. She acknowledged that large sips and straw sips are too much at times and make her cough. She said she should probably avoid straws and I suggested taking pills in puree. Overall pt is aware of strategies and generally tolerates PO well. No further interventions needed will sign off.   HPI HPI: Morgan Bean is a 53 y.o female with hypertension, hyperlipidemia, CVA, CAD, COPD, CHF, and anemia/neutropenia who presented to the ED with a sore throat of 2 days duration. The patient was in her usual state of health until 2 days ago when she acutely developed sore throat, myalgias, arthralgias, fevers, chills, productive cough (thick and yellow), SHOB, and lack of appetite. She has noticed a change in her voice, bilateral neck pain, and trismus. Her symptoms have caused so much discomfort that she has been unable to sleep. CXR 9/25 suggestive of atalectasis or scarring.      SLP Plan  All goals met       Recommendations  Diet recommendations: Dysphagia 3 (mechanical soft);Thin liquid Liquids provided via: Cup Medication Administration: Whole meds with puree Supervision: Patient able to self feed Compensations: Slow rate;Small sips/bites                Plan: All goals met       GO               Herbie Baltimore, MA CCC-SLP  Acute Rehabilitation Services Pager [phone] Office [phone]  Lynann Beaver 12/20/2018, 1:31 PM

## 2018-12-20 NOTE — Discharge Summary (Signed)
Name: Morgan Bean MRN: [id number] DOB: [birth year] 53 y.o. PCP: Ladell Pier, MD  Date of Admission: 12/16/2018  1:11 PM Date of Discharge: 12/22/2018 Attending Physician: Oda Kilts, MD  Discharge Diagnosis: 1. Supraglottitis and Epiglottitis  2. Neutropenic Fever  3. Atrial fibrillation  4. Microcytic Anemia  Discharge Medications: Allergies as of 12/22/2018      Reactions   Ciprofloxacin Itching      Medication List    TAKE these medications   amiodarone 200 MG tablet Commonly known as: PACERONE Take 1 tablet (200 mg total) by mouth daily.   atorvastatin 40 MG tablet Commonly known as: LIPITOR Take 1 tablet (40 mg total) by mouth at bedtime.   buPROPion 150 MG 12 hr tablet Commonly known as: WELLBUTRIN SR Take 1 tablet (150 mg total) by mouth 2 (two) times daily.   citalopram 40 MG tablet Commonly known as: CELEXA Take 1 tablet (40 mg total) by mouth daily.   cloNIDine 0.1 MG tablet Commonly known as: CATAPRES Take 1 tablet (0.1 mg total) by mouth 2 (two) times daily. MUST KEEP APPT ON 11/07/18 FOR FURTHER REFILLS   diltiazem 180 MG 24 hr capsule Commonly known as: CARDIZEM CD Take 1 capsule (180 mg total) by mouth daily.   furosemide 40 MG tablet Commonly known as: LASIX Take 1 tablet (40 mg total) by mouth 2 (two) times daily.   lisinopril 10 MG tablet Commonly known as: ZESTRIL Take 1 tablet (10 mg total) by mouth daily.   loratadine 10 MG tablet Commonly known as: CLARITIN Take 1 tablet (10 mg total) by mouth daily as needed for allergies.   predniSONE 10 MG tablet Commonly known as: DELTASONE Take 4 tablets (40 mg total) by mouth daily with breakfast for 3 days, THEN 2 tablets (20 mg total) daily with breakfast for 3 days, THEN 1 tablet (10 mg total) daily with breakfast for 3 days, THEN 0.5 tablets (5 mg total) daily with breakfast for 4 days. Start taking on: December 23, 2018   rivaroxaban 20 MG Tabs tablet Commonly known as:  XARELTO Take 1 tablet (20 mg total) by mouth daily with supper.   topiramate 25 MG tablet Commonly known as: TOPAMAX Take 2 tablets (50 mg total) by mouth at bedtime.   traZODone 150 MG tablet Commonly known as: DESYREL Take 1 tablet (150 mg total) by mouth at bedtime.       Disposition and follow-up:   Ms.Morgan Bean was discharged from [facility] in Good condition.  At the hospital follow up visit please address:  1.  Supraglottitis/Epiglottits:  Neutropenic Fever: repeat labs to ensure continued improvement in neutrophil count Rheumatoid Arthritis: positive ANA during admission, please ensure referral to rheumatologist.   2.  Labs / imaging needed at time of follow-up: CBC, BMP, repeat ct neck  3.  Pending labs/ test needing follow-up: None  Follow-up Appointments: Follow-up Information    Heath Lark, MD Follow up.   Specialty: Hematology and Oncology Why: Clinic will call you to schedule appointment Contact information: [place] [id number]        Ladell Pier, MD. Schedule an appointment as soon as possible for a visit.   Specialty: Internal Medicine Contact information: [place] [phone]        [facility]. Schedule an appointment as soon as possible for a visit.   Why: Please make an appointment for ASAP  Contact information: [place] [address] [place] [facility] Course by problem list: 1. Neutropenic Fever:  Patient has a significant history of cyclic neutropenic fever episodes.  She has presented to the hospital several times since 2016 and has had at least 3 hospitalizations for neutropenia at [facility] since 2019. The patient has previously been worked up by [employer] and has had several bone marrow biopsies which has ruled out malignant processes or myelodysplastic  syndrome.  Heme/Onc notes state that the patient has had cyclic neutropenic episodes that have been responsive to steroids, which may indicate a autoimmune etiology. Autoimmune workup showed a positive ANA, RF and ribonucleic protein. CCP was negative. She was discharged with Prednisone taper and instructions to follow up with Dr. Alvy Bimler.   2. Supraglottitis/Epiglottitis:  CT neck on admission notable for supraglottitis and epiglottitis with narrowing of her airway.  ENT was consulted and recommended antibiotics, dexamethasone and pain management for drooling or stridor. Patient was started on Vancomycin, Cefepime. Improved significantly with Dexamethasone treatment and she was discharged on a Prednisone taper. No ifnectious cause was identified. Follow up was recommended with [facility].   3. Iron Deficiency Anemia: Stable microcytic anemia noted on admission with a iron panel that indicated low iron saturation. Of note, her UA on admission showed microscopic hematuria. Recommend outpatient follow up.   4. Atrial Fibrillation:  Heart rate fluctuated throughout stay but remained mostly in the low 100s. She was kept on her home medications with the exception of a couple days when Diltiazem was held for hypotension. She was discharged with home Amiodarone, Diltiazem and the addition of Xarelto.    Discharge Vitals:   BP (!) 131/108   Pulse 92   Temp 98 F (36.7 C) (Oral)   Resp 18   Ht 5\' 6"  (1.676 m)   Wt 88.4 kg   LMP 05/10/2013   SpO2 100%   BMI 31.46 kg/m   Pertinent Labs, Studies, and Procedures:  CBC CBC Latest Ref Rng & Units 12/27/2018 12/22/2018 12/21/2018  WBC 4.0 - 10.5 K/uL 4.3 1.1(LL) 0.9(LL)  Hemoglobin 12.0 - 15.0 g/dL 9.7(L) 9.7(L) 9.9(L)  Hematocrit 36.0 - 46.0 % 31.4(L) 30.8(L) 30.4(L)  Platelets 150 - 400 K/uL 288 288 257   BMP Latest Ref Rng & Units 12/22/2018 12/21/2018 12/20/2018  Glucose 70 - 99 mg/dL 123(H) 138(H) 102(H)  BUN 6 - 20 mg/dL 20 16 23(H)   Creatinine 0.44 - 1.00 mg/dL 1.03(H) 0.96 1.32(H)  BUN/Creat Ratio 9 - 23 - - -  Sodium 135 - 145 mmol/L 137 137 139  Potassium 3.5 - 5.1 mmol/L 4.2 4.2 4.1  Chloride 98 - 111 mmol/L 112(H) 110 112(H)  CO2 22 - 32 mmol/L 17(L) 18(L) 22  Calcium 8.9 - 10.3 mg/dL 8.9 8.6(L) 8.3(L)   Iron: 7 TIBC: 228 Saturation Ratio: 3 Ferritin 285  ANA: Positive RF: 26.4 Ribonucleic protein: 3.3   CT Soft Tissue: 12/16/2018 IMPRESSION: 1. Findings consistent with acute supraglottitis with involvement of the epiglottis and narrowing of the supraglottic airway. Patient felt to be at risk for airway compromise. No abscess or drainable fluid collection. Associated mass-like thickening and edema of the hypopharynx at the level of the piriform sinuses, likely reflecting acute inflammation, although an underlying mass could also have this appearance. ENT follow-up with repeat imaging following convalescence to ensure these changes resolve is recommended. 2. Enlarged bilateral level II/III adenopathy, likely reactive. 3.  Poor dentition.  CT Neck 12/20/2018 IMPRESSION: 1. Again demonstrated are findings consistent with acute pharyngitis, tonsillitis and supraglottitis. Although swelling of the epiglottis and lingual tonsils has decreased, the remainder of the supraglottic swelling is similar or even subtly increased. Associated narrowing of the supraglottic airway appears progressed, now moderate. No organized drainable collection identified. Associated masslike thickening and edema of the hypopharynx at the level of the piriform sinus appears similar to prior exam, and likely reflects acute inflammation, although underlying mass could have this appearance. ENT follow-up with imaging follow-up to resolution recommended. 2. Persistent bilateral cervical lymphadenopathy, likely reactive.  Discharge Instructions: Discharge Instructions    Call MD for:   Complete by: As directed    Difficulty  swallowing  Worsened throat swelling   Call MD for:  difficulty breathing, headache or visual disturbances   Complete by: As directed    Call MD for:  persistant dizziness or light-headedness   Complete by: As directed    Call MD for:  severe uncontrolled pain   Complete by: As directed    Call MD for:  temperature >100.4   Complete by: As directed    Diet - low sodium heart healthy   Complete by: As directed    Discharge instructions   Complete by: As directed    Thank you for allowing [place] to care for you during your hospital stay.   Start your Prednisone (Steroid) medication tomorrow.   Please follow up very soon with Dr. Alvy Bimler to make sure you continue to improve. In addition, call the [facility] doctors to have an appointment set up for ASAP.   Increase activity slowly   Complete by: As directed       Signed: Dr. Jose Persia Internal Medicine PGY-1  Pager: [phone] 12/29/2018, 4:06 PM

## 2018-12-21 LAB — CBC WITH DIFFERENTIAL/PLATELET
Abs Immature Granulocytes: 0 10*3/uL (ref 0.00–0.07)
Abs Immature Granulocytes: 0 10*3/uL (ref 0.00–0.07)
Basophils Absolute: 0 10*3/uL (ref 0.0–0.1)
Basophils Absolute: 0 10*3/uL (ref 0.0–0.1)
Basophils Relative: 0 %
Basophils Relative: 0 %
Eosinophils Absolute: 0 10*3/uL (ref 0.0–0.5)
Eosinophils Absolute: 0 10*3/uL (ref 0.0–0.5)
Eosinophils Relative: 0 %
Eosinophils Relative: 0 %
HCT: 32.8 % — ABNORMAL LOW (ref 36.0–46.0)
HCT: 30.4 % — ABNORMAL LOW (ref 36.0–46.0)
Hemoglobin: 10 g/dL — ABNORMAL LOW (ref 12.0–15.0)
Hemoglobin: 9.9 g/dL — ABNORMAL LOW (ref 12.0–15.0)
Immature Granulocytes: 0 %
Lymphocytes Relative: 90 %
Lymphocytes Relative: 96 %
Lymphs Abs: 0.9 10*3/uL (ref 0.7–4.0)
Lymphs Abs: 0.9 10*3/uL (ref 0.7–4.0)
MCH: 25.4 pg — ABNORMAL LOW (ref 26.0–34.0)
MCH: 26.7 pg (ref 26.0–34.0)
MCHC: 30.5 g/dL (ref 30.0–36.0)
MCHC: 32.6 g/dL (ref 30.0–36.0)
MCV: 83.5 fL (ref 80.0–100.0)
MCV: 81.9 fL (ref 80.0–100.0)
Monocytes Absolute: 0.1 10*3/uL (ref 0.1–1.0)
Monocytes Absolute: 0 10*3/uL — ABNORMAL LOW (ref 0.1–1.0)
Monocytes Relative: 8 %
Monocytes Relative: 4 %
Neutro Abs: 0 10*3/uL — ABNORMAL LOW (ref 1.7–7.7)
Neutro Abs: 0 10*3/uL — ABNORMAL LOW (ref 1.7–7.7)
Neutrophils Relative %: 2 %
Neutrophils Relative %: 0 %
Platelets: 224 10*3/uL (ref 150–400)
Platelets: 257 10*3/uL (ref 150–400)
RBC: 3.93 MIL/uL (ref 3.87–5.11)
RBC: 3.71 MIL/uL — ABNORMAL LOW (ref 3.87–5.11)
RDW: 16 % — ABNORMAL HIGH (ref 11.5–15.5)
RDW: 16.2 % — ABNORMAL HIGH (ref 11.5–15.5)
WBC: 1 10*3/uL — CL (ref 4.0–10.5)
WBC: 0.9 10*3/uL — CL (ref 4.0–10.5)
nRBC: 0 % (ref 0.0–0.2)
nRBC: 0 % (ref 0.0–0.2)
nRBC: 0 /100 WBC

## 2018-12-21 LAB — BASIC METABOLIC PANEL
Anion gap: 9 (ref 5–15)
BUN: 16 mg/dL (ref 6–20)
CO2: 18 mmol/L — ABNORMAL LOW (ref 22–32)
Calcium: 8.6 mg/dL — ABNORMAL LOW (ref 8.9–10.3)
Chloride: 110 mmol/L (ref 98–111)
Creatinine, Ser: 0.96 mg/dL (ref 0.44–1.00)
GFR calc Af Amer: >60 mL/min (ref >60)
GFR calc non Af Amer: >60 mL/min (ref >60)
Glucose, Bld: 138 mg/dL — ABNORMAL HIGH (ref 70–99)
Potassium: 4.2 mmol/L (ref 3.5–5.1)
Sodium: 137 mmol/L (ref 135–145)

## 2018-12-21 LAB — CULTURE, BLOOD (ROUTINE X 2)
Culture: NO GROWTH
Culture: NO GROWTH
Special Requests: ADEQUATE

## 2018-12-21 LAB — HISTONE ANTIBODIES, IGG, BLOOD: DNA-Histone: 0.7 Units (ref 0.0–0.9)

## 2018-12-21 MED ORDER — VANCOMYCIN HCL 10 G IV SOLR
1750.0000 mg | INTRAVENOUS | Status: DC
  Administered 2018-12-21: 1750 mg via INTRAVENOUS
  Filled 2018-12-21 (×2): qty 1750

## 2018-12-21 NOTE — Progress Notes (Addendum)
   Subjective:   Patient reports her voice is improving again with dexamethasone. Denies any acute changes overnight. Discussed goals moving forward and patient was hoping to be discharged in the next coupled days in order to make other appointments. Team agrees if she continues to improve we could arrange for discharge.   Objective:  Vital signs in last 24 hours: Vitals:   12/21/18 0006 12/21/18 0030 12/21/18 0430 12/21/18 0506  BP:  (!) 130/96 (!) 144/119 (!) 141/103  Pulse:  96 83 92  Resp:  17 17 14   Temp: 97.7 F (36.5 C)     TempSrc: Oral     SpO2:  99% 99% 94%  Weight:      Height:       Physical Exam Vitals signs reviewed.  Constitutional:      General: She is not in acute distress. HENT:     Head:     Comments: Submandibular lymphadenopathy    Mouth/Throat:     Mouth: Mucous membranes are dry.  Cardiovascular:     Rate and Rhythm: Tachycardia present. Rhythm irregular.  Lymphadenopathy:     Cervical: Cervical adenopathy present.  Skin:    General: Skin is warm.  Neurological:     Mental Status: She is alert.     Assessment/Plan:  Principal Problem:   Febrile neutropenia ([name]) Active Problems:   Essential hypertension   Chronic diastolic heart failure ([name])   History of cocaine abuse ([name])   Pancytopenia, acquired ([name])   Head and neck lymphadenopathy   PAF (paroxysmal atrial fibrillation) ([name])   CRI (chronic renal insufficiency), stage 3 (moderate) ([name])  Morgan Bean is a 53 year old woman with a history of CVA, CAD, COPD, FF, paroxysmal A. fib on anticoagulant, CKD stage III, cocaine use and recurrent neutropenia who presents after 2 days of sore throat and fever.  Exam notable for trismus and a hoarse voice on presentation.   #Supraglottitis/epiglottitis CT neck notable for supraglottitis and epiglottitis with narrowing of her airway.  ENT was consulted and recommended antibiotics, dexamethasone and pain management for drooling or stridor. Patient  had significant improvement and on 9/28 she reported feeling back to 80%. Appreciated improvement on exam. Last dose of dexamethasone ~1300 on 9/26. On exam 9/29  patient had "hot potatoe voice" and increased swelling was appreciated. Talked to ENT who recommended dexamethasone for 2 days and then start prednisone w/ taper. Improvement today,if patient continues to improve will consider starting prednisone 40 mg tomorrow.  -Continue vancomycin and cefepime -Continue hydromorphone as needed for pain -Consult ENT with reoccurrence  - Continue dexamethasone  #Neutropenic fever Patient has unclear etiology of her current neutropenia and has been evaluated previously by Dr.Gorsuch and received a second opinion at [facility].  She was treated with steroids in the past with a good response. WBC 0.8 on admission. Patient has not had a fever overnight and WBC improving. Unclear cause of her neutropenia.  Doing autoimmune work . EBV and CMV neg.  Dr.Gorsuch will have her clinic call patient to schedule appointment. Positive ANA and rheumatoid factor. Ribonucleic protein positive. Histone antibodies neg. CCP negative.  - Dr. Alvy Bimler recommends 7 day course of antibiotics and follow-up within a week of discharge.  -Follow-up blood cultures (NGTD 5 days) - Follow up levamisole lab -CBC w/diff  in AM  Diet: Full liquid VTE ppx: Lovenox  Code Status: FULL   Dispo: Anticipated discharge is pending clinical improvement.    Tamsen Snider, MD PGY1  [phone]

## 2018-12-21 NOTE — Progress Notes (Signed)
Pharmacy Antibiotic Note  Morgan Bean is a 53 y.o. female admitted on 12/16/2018 with neutropenic fever. PMH significant for pancytopenia. Febrile upon ED arrival with Tmax 103.1, WBC 1.5, ANC <0.1. Pharmacy has been consulted for vancomycin and cefepime dosing.  Day # 6 on broad spectrum antibiotics for supraglottitis and epiglottitis Afebrile, Scr now improved Ongoing neutropenia  Plan: Continue  cefepime to 2g IV q8h Increase Vancomycin to 1750 mg iv Q 24 hours  Follow up ENT plan and antibiotic LOT  Height: 5\' 6"  (167.6 cm) Weight: 194 lb 14.2 oz (88.4 kg) IBW/kg (Calculated) : 59.3  Temp (24hrs), Avg:98.5 F (36.9 C), Min:97.7 F (36.5 C), Max:99 F (37.2 C)  Recent Labs  Lab 12/16/18 1335 12/17/18 0351 12/19/18 0256 12/20/18 0206 12/21/18 0258  WBC 1.5* 0.8* 2.0* 3.6*  3.5* 1.0*  CREATININE 1.44* 1.16* 1.38* 1.32* 0.96  LATICACIDVEN 1.0  --   --   --   --     Estimated Creatinine Clearance: 74.1 mL/min (by C-G formula based on SCr of 0.96 mg/dL).    Allergies  Allergen Reactions  . Ciprofloxacin Itching    Antimicrobials this admission: Cefepime 9/25 >> Vancomycin 9/25 >>  Microbiology results: 9/25 BCx: NG final 9/25 UCx: NG 9/25 COVID: negative   Thank you  Anette Guarneri, PharmD [phone]

## 2018-12-22 ENCOUNTER — Encounter (HOSPITAL_COMMUNITY): Payer: Self-pay | Admitting: Anesthesiology

## 2018-12-22 DIAGNOSIS — Z79899 Other long term (current) drug therapy: Secondary | ICD-10-CM

## 2018-12-22 DIAGNOSIS — N183 Chronic kidney disease, stage 3 unspecified: Secondary | ICD-10-CM

## 2018-12-22 DIAGNOSIS — R3129 Other microscopic hematuria: Secondary | ICD-10-CM

## 2018-12-22 DIAGNOSIS — D509 Iron deficiency anemia, unspecified: Secondary | ICD-10-CM

## 2018-12-22 LAB — CBC WITH DIFFERENTIAL/PLATELET
Abs Immature Granulocytes: 0 10*3/uL (ref 0.00–0.07)
Basophils Absolute: 0 10*3/uL (ref 0.0–0.1)
Basophils Relative: 0 %
Eosinophils Absolute: 0 10*3/uL (ref 0.0–0.5)
Eosinophils Relative: 0 %
HCT: 30.8 % — ABNORMAL LOW (ref 36.0–46.0)
Hemoglobin: 9.7 g/dL — ABNORMAL LOW (ref 12.0–15.0)
Lymphocytes Relative: 92 %
Lymphs Abs: 1 10*3/uL (ref 0.7–4.0)
MCH: 25.9 pg — ABNORMAL LOW (ref 26.0–34.0)
MCHC: 31.5 g/dL (ref 30.0–36.0)
MCV: 82.4 fL (ref 80.0–100.0)
Monocytes Absolute: 0.1 10*3/uL (ref 0.1–1.0)
Monocytes Relative: 8 %
Neutro Abs: 0 10*3/uL — ABNORMAL LOW (ref 1.7–7.7)
Neutrophils Relative %: 0 %
Platelets: 288 10*3/uL (ref 150–400)
RBC: 3.74 MIL/uL — ABNORMAL LOW (ref 3.87–5.11)
RDW: 16.3 % — ABNORMAL HIGH (ref 11.5–15.5)
WBC: 1.1 10*3/uL — CL (ref 4.0–10.5)
nRBC: 0 % (ref 0.0–0.2)

## 2018-12-22 LAB — BASIC METABOLIC PANEL
Anion gap: 8 (ref 5–15)
BUN: 20 mg/dL (ref 6–20)
CO2: 17 mmol/L — ABNORMAL LOW (ref 22–32)
Calcium: 8.9 mg/dL (ref 8.9–10.3)
Chloride: 112 mmol/L — ABNORMAL HIGH (ref 98–111)
Creatinine, Ser: 1.03 mg/dL — ABNORMAL HIGH (ref 0.44–1.00)
GFR calc Af Amer: >60 mL/min (ref >60)
GFR calc non Af Amer: >60 mL/min (ref >60)
Glucose, Bld: 123 mg/dL — ABNORMAL HIGH (ref 70–99)
Potassium: 4.2 mmol/L (ref 3.5–5.1)
Sodium: 137 mmol/L (ref 135–145)

## 2018-12-22 MED ORDER — VANCOMYCIN HCL 10 G IV SOLR
1250.0000 mg | INTRAVENOUS | Status: DC
  Filled 2018-12-22: qty 1250

## 2018-12-22 MED ORDER — AMLODIPINE BESYLATE 5 MG PO TABS
5.0000 mg | ORAL_TABLET | Freq: Once | ORAL | Status: AC
  Administered 2018-12-22: 5 mg via ORAL
  Filled 2018-12-22: qty 1

## 2018-12-22 MED ORDER — DILTIAZEM HCL ER COATED BEADS 120 MG PO CP24
120.0000 mg | ORAL_CAPSULE | Freq: Every day | ORAL | Status: DC
  Administered 2018-12-22: 120 mg via ORAL
  Filled 2018-12-22: qty 1

## 2018-12-22 MED ORDER — PREDNISONE 20 MG PO TABS
40.0000 mg | ORAL_TABLET | Freq: Every day | ORAL | Status: DC
  Administered 2018-12-22: 40 mg via ORAL
  Filled 2018-12-22: qty 2

## 2018-12-22 MED ORDER — PREDNISONE 10 MG PO TABS: ORAL_TABLET | ORAL | 0 refills | Status: AC

## 2018-12-22 NOTE — Progress Notes (Addendum)
Pharmacy Antibiotic Note  Morgan Bean is a 53 y.o. female admitted on 12/16/2018 with febrile neutropenia.  Pharmacy has been consulted for Vanco/Cefepime dosing.  ID: Neutropenic fever. Febrile upon ED arrival with Tmax 103.1>>now afebrile. WBC pending this AM Current ANC 0.  - No onc hx. Being evaluated at [facility] for recurrent neutropenia  - 9/30 worsening supraglottitis, consulting ENT again  Cefepime 9/25 >> Vancomycin 9/25 >>  - 10/1: Vancomycin 1250 mg IV Q 24 hrs. Goal AUC 400-550. Expected AUC: 540.2 (using Vd 0.5) SCr used: 1.03  9/25 BCx: negative 9/25 UCx: negative 9/25 COVID: negative  Plan: Xarelto 20mg /d Vancomycin 1250mg  IV q 24h Cefepime 2g IV q 8hrs. Possibly transition to oral abx and discharge.    Height: 5\' 6"  (167.6 cm) Weight: 194 lb 14.2 oz (88.4 kg) IBW/kg (Calculated) : 59.3  Temp (24hrs), Avg:98 F (36.7 C), Min:97.6 F (36.4 C), Max:98.6 F (37 C)  Recent Labs  Lab 12/16/18 1335 12/17/18 0351 12/19/18 0256 12/20/18 0206 12/21/18 0258 12/21/18 1925 12/22/18 0233 12/22/18 0716  WBC 1.5* 0.8* 2.0* 3.6*  3.5* 1.0* 0.9*  --  PENDING  CREATININE 1.44* 1.16* 1.38* 1.32* 0.96  --  1.03*  --   LATICACIDVEN 1.0  --   --   --   --   --   --   --     Estimated Creatinine Clearance: 69.1 mL/min (A) (by C-G formula based on SCr of 1.03 mg/dL (H)).    Allergies  Allergen Reactions  . Ciprofloxacin Itching     Tabor Denham S. Alford Highland, PharmD, [place] Clinical Staff Pharmacist Pager [phone] Eilene Ghazi [facility] 12/22/2018 8:33 AM

## 2018-12-22 NOTE — Progress Notes (Signed)
Subjective:   Ms. Kleiber reports she is feeling much better today.  She is having no difficulty with swallowing, eating, or breathing.  She is able to eat her dysphasia diet and would like to try a full diet if possible.  She knows she needs to follow-up with her hematologist and her ENT physician.  Denies any other pain at this time, including chest pain, abdominal pain.  Denies nausea, vomiting.  Objective:  Vital signs in last 24 hours: Vitals:   12/22/18 0103 12/22/18 0442 12/22/18 0505 12/22/18 0524  BP: (!) 146/107 (!) 165/123 (!) 166/114   Pulse: 99 (!) 115    Resp: 16 20 16    Temp: 97.7 F (36.5 C) 98 F (36.7 C)    TempSrc: Oral Oral    SpO2: 95% 98%  100%  Weight:      Height:       Physical Exam Vitals signs and nursing note reviewed.  Constitutional:      General: She is not in acute distress.    Appearance: She is obese. She is not toxic-appearing.  HENT:     Mouth/Throat:     Mouth: Mucous membranes are moist.     Pharynx: Uvula midline. Pharyngeal swelling (mild, no obstruction) present. No oropharyngeal exudate, posterior oropharyngeal erythema or uvula swelling.  Cardiovascular:     Rate and Rhythm: Normal rate. Rhythm irregular.     Heart sounds: No murmur.  Pulmonary:     Effort: Pulmonary effort is normal. No respiratory distress.     Breath sounds: No wheezing or rales.  Abdominal:     General: Bowel sounds are normal. There is no distension.     Palpations: Abdomen is soft.     Tenderness: There is no abdominal tenderness.  Skin:    General: Skin is warm and dry.  Neurological:     General: No focal deficit present.     Mental Status: She is alert and oriented to person, place, and time.    Assessment/Plan:  Principal Problem:   Febrile neutropenia ([name]) Active Problems:   Essential hypertension   Chronic diastolic heart failure ([name])   History of cocaine abuse ([name])   Pancytopenia, acquired ([name])   Head and neck lymphadenopathy  PAF (paroxysmal atrial fibrillation) ([name])   CRI (chronic renal insufficiency), stage 3 (moderate)  Morgan Bean is a 53 year old woman with a history of CVA, CAD, COPD, FF, paroxysmal A. fib on anticoagulant, CKD stage III, cocaine use and recurrent neutropenia who presents after 2 days of sore throat and fever.   # Supraglottitis/epiglottitis CT neck on admission notable for supraglottitis and epiglottitis with narrowing of her airway.  ENT was consulted and recommended antibiotics, dexamethasone and pain management for drooling or stridor. She improved significantly with Decadron. Since she is feeling so much better today, will try oral Prednisone 40mg . If able to tolerate without worsening of throat swelling, will discharge with instructions to schedule close follow up.  - Continue vancomycin and cefepime through last doses today, then D/C - Hydromorphone PRN  - Consult ENT if reoccurrence or respiratory distress  # Neutropenic fever Patient has unclear etiology of her current neutropenia and has been evaluated previously by Dr.Gorsuch and received a second opinion at [facility].  She was treated with steroids in the past with a good response. WBC 0.8 on admission, and has improved to 1.1 today. Afebrile for 6 days now.    Dr.Gorsuch will have her clinic call patient to schedule appointment. Positive ANA  and rheumatoid factor. Ribonucleic protein positive. Histone antibodies neg. CCP negative.   - Follow up with Dr. Alvy Bimler - Follow up levamisole lab  # A. Fib: Home medication includes Amiodarone and Diltiazem. Diltiazem was stopped for hypotension earlier during admission but she is now experiencing hypertension, so will restart and monitor closely. Plan to discharge on home medication if possible, in addition to Xarelto.   - Amiodarone 200mg  QD - Xarelto 20mg  QD - Start Diltiazem 120 mg QD - Monitor HR and BP closely   Dispo: Anticipated discharge in approximately 0-1  day(s).   Dr. Jose Persia Internal Medicine PGY-1  Pager: [phone] 12/22/2018, 6:32 AM

## 2018-12-22 NOTE — Plan of Care (Signed)
  Problem: Education: Goal: Knowledge of General Education information will improve Description: Including pain rating scale, medication(s)/side effects and non-pharmacologic comfort measures Outcome: Progressing   Problem: Health Behavior/Discharge Planning: Goal: Ability to manage health-related needs will improve Outcome: Progressing   Problem: Clinical Measurements: Goal: Will remain free from infection Outcome: Progressing Goal: Diagnostic test results will improve Outcome: Progressing   Problem: Safety: Goal: Ability to remain free from injury will improve Outcome: Progressing   Problem: Activity: Goal: Capacity to carry out activities will improve Outcome: Progressing Note: Improvement noted in activities.   Problem: Cardiac: Goal: Ability to achieve and maintain adequate cardiopulmonary perfusion will improve Outcome: Not Progressing Note: Slow progress being made towards goals.  Patient continues to have elevated diastolic blood pressure.  MAP remains greater than 70.

## 2018-12-22 NOTE — Progress Notes (Signed)
Nsg Discharge Note  Patient all ready for discharge. Awaiting transportation from brother.  Admit Date:  12/16/2018 Discharge date: 12/22/2018   Nimo Whillock to be D/C'd Home per MD order.  AVS completed.  Copy for chart, and copy for patient signed, and dated. Patient/caregiver able to verbalize understanding.  Discharge Medication: Allergies as of 12/22/2018      Reactions   Ciprofloxacin Itching      Medication List    TAKE these medications   amiodarone 200 MG tablet Commonly known as: PACERONE Take 1 tablet (200 mg total) by mouth daily.   atorvastatin 40 MG tablet Commonly known as: LIPITOR Take 1 tablet (40 mg total) by mouth at bedtime.   buPROPion 150 MG 12 hr tablet Commonly known as: WELLBUTRIN SR Take 1 tablet (150 mg total) by mouth 2 (two) times daily.   citalopram 40 MG tablet Commonly known as: CELEXA Take 1 tablet (40 mg total) by mouth daily.   cloNIDine 0.1 MG tablet Commonly known as: CATAPRES Take 1 tablet (0.1 mg total) by mouth 2 (two) times daily. MUST KEEP APPT ON 11/07/18 FOR FURTHER REFILLS   diltiazem 180 MG 24 hr capsule Commonly known as: CARDIZEM CD Take 1 capsule (180 mg total) by mouth daily.   furosemide 40 MG tablet Commonly known as: LASIX Take 1 tablet (40 mg total) by mouth 2 (two) times daily.   lisinopril 10 MG tablet Commonly known as: ZESTRIL Take 1 tablet (10 mg total) by mouth daily.   loratadine 10 MG tablet Commonly known as: CLARITIN Take 1 tablet (10 mg total) by mouth daily as needed for allergies.   predniSONE 10 MG tablet Commonly known as: DELTASONE Take 4 tablets (40 mg total) by mouth daily with breakfast for 3 days, THEN 2 tablets (20 mg total) daily with breakfast for 3 days, THEN 1 tablet (10 mg total) daily with breakfast for 3 days, THEN 0.5 tablets (5 mg total) daily with breakfast for 4 days. Start taking on: December 23, 2018   rivaroxaban 20 MG Tabs tablet Commonly known as: XARELTO Take 1 tablet  (20 mg total) by mouth daily with supper.   topiramate 25 MG tablet Commonly known as: TOPAMAX Take 2 tablets (50 mg total) by mouth at bedtime.   traZODone 150 MG tablet Commonly known as: DESYREL Take 1 tablet (150 mg total) by mouth at bedtime.       Discharge Assessment: Vitals:   12/22/18 1142 12/22/18 1543  BP: (!) 143/106 (!) 131/108  Pulse: 92 92  Resp: 20 18  Temp: 97.6 F (36.4 C) 98 F (36.7 C)  SpO2: 100% 100%   Skin clean, dry and intact without evidence of skin break down, no evidence of skin tears noted. IV catheter discontinued intact. Site without signs and symptoms of complications - no redness or edema noted at insertion site, patient denies c/o pain - only slight tenderness at site.  Dressing with slight pressure applied.  D/c Instructions-Education: Discharge instructions given to patient/family with verbalized understanding. D/c education completed with patient/family including follow up instructions, medication list, d/c activities limitations if indicated, with other d/c instructions as indicated by MD - patient able to verbalize understanding, all questions fully answered. Patient instructed to return to ED, call 911, or call MD for any changes in condition.  Patient escorted via [place], and D/C home via private auto.  Erasmo Leventhal, RN 12/22/2018 6:48 PM

## 2018-12-22 NOTE — TOC Initial Note (Signed)
Transition of Care [facility]) - Initial/Assessment Note    Patient Details  Name: Morgan Bean MRN: [id number] Date of Birth: Aug 19, 1965  [facility]) CM/SW Contact:    Sharin Mons, RN Phone Number: 12/22/2018, 3:25 PM  Clinical Narrative:        Admitted with febrile neutropenia. From home with friends. States independent with ADL's and no DME usage PTA.  TOC team monitoring for TOC needs...  Expected Discharge Plan: Home/Self Care(Resides with friends) Barriers to Discharge: Continued Medical Work up   Patient Goals and CMS Choice     Choice offered to / list presented to : NA  Expected Discharge Plan and Services Expected Discharge Plan: Home/Self Care(Resides with friends)   Discharge Planning Services: CM Consult Post Acute Care Choice: NA Living arrangements for the past 2 months: Single Family Home                 DME Arranged: N/A DME Agency: NA       HH Arranged: NA [place] Agency: NA        Prior Living Arrangements/Services Living arrangements for the past 2 months: Single Family Home Lives with:: Other (Comment) Patient language and need for interpreter reviewed:: Yes Do you feel safe going back to the place where you live?: Yes      Need for Family Participation in Patient Care: Yes (Comment) Care giver support system in place?: Yes (comment)   Criminal Activity/Legal Involvement Pertinent to Current Situation/Hospitalization: No - Comment as needed  Activities of Daily Living Home Assistive Devices/Equipment: Walker (specify type)(standard walker) ADL Screening (condition at time of admission) Patient's cognitive ability adequate to safely complete daily activities?: Yes Is the patient deaf or have difficulty hearing?: No Does the patient have difficulty seeing, even when wearing glasses/contacts?: No Does the patient have difficulty concentrating, remembering, or making decisions?: No Patient able to express need for assistance with  ADLs?: Yes Does the patient have difficulty dressing or bathing?: Yes Independently performs ADLs?: Yes (appropriate for developmental age) Does the patient have difficulty walking or climbing stairs?: No Weakness of Legs: None Weakness of Arms/Hands: None  Permission Sought/Granted Permission sought to share information with : Case Manager, Family Supports Permission granted to share information with : Yes, Verbal Permission Granted  Share Information with NAME: Wynelle Link [id number]           Emotional Assessment   Attitude/Demeanor/Rapport: Engaged Affect (typically observed): Accepting Orientation: : Oriented to Self, Oriented to Place, Oriented to  Time, Oriented to Situation Alcohol / Substance Use: Tobacco Use, Illicit Drugs, Alcohol Use(Pt declined substance abuse and smoking cessation resources) Psych Involvement: No (comment)  Admission diagnosis:  Neutropenic fever ([place]) [D70.9, R50.81] Sepsis without acute organ dysfunction, due to unspecified organism [facility]) [A41.9] Patient Active Problem List   Diagnosis Date Noted  . Febrile neutropenia ([place]) 12/16/2018  . Chronic anticoagulation 10/14/2018  . CRI (chronic renal insufficiency), stage 3 (moderate) 10/14/2018  . Upper airway cough syndrome 06/30/2018  . Tobacco dependence in remission 04/11/2018  . Substance abuse in remission ([place]) 04/11/2018  . Depression, recurrent ([place]) 04/11/2018  . PAF (paroxysmal atrial fibrillation) ([place]) 03/10/2018  . Acute pulmonary edema ([name])   . Atrial fibrillation with RVR ([place])   . Nonrheumatic mitral valve regurgitation   . Acute on chronic diastolic CHF (congestive heart failure) ([place]) 03/08/2018  . Hypertensive urgency 03/08/2018  . Acute respiratory failure with hypoxia ([place]) 03/08/2018  . Pancytopenia, acquired ([place]) 07/16/2015  . Head and neck  lymphadenopathy 07/16/2015  . 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 Absolute  anemia 02/25/2015  . Preventive measure 12/27/2014  . 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 History of stroke 12/26/2014  . 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 Headache 02/22/2014  . Dental caries 01/09/2014  . Cough 11/13/2013  . Cigarette smoker 11/13/2013  . Swelling of gums 11/13/2013  . Fatigue 11/10/2013  . Intracranial atherosclerosis 11/10/2013  . Hyperreflexia 10/04/2013  . TIA (transient ischemic attack) 10/04/2013  . Fibroid uterus 09/14/2012  . Perimenopause 09/14/2012  . Chronic cough 08/12/2012  . DOE (dyspnea on exertion) 08/12/2012  . History of CVA (cerebrovascular accident) 08/09/2012  . Chest pain, mid sternal 04/07/2012  . Pulmonary hypertension ([place]) 04/06/2012  . Polysubstance abuse ([place]) 04/06/2012  . History of cocaine abuse ([place]) 11/16/2011  . Major depressive disorder, recurrent episode with psychotic features. 11/15/2011    Class: Acute  . Chronic diastolic heart failure ([place]) 02/24/2011  . CAD (coronary artery disease) 02/24/2011  . Hyperlipidemia 01/06/2011  . Essential hypertension 12/15/2010  . Exertional dyspnea 12/15/2010  . 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

## 2018-12-22 NOTE — Progress Notes (Addendum)
Elevated BP noted, 165/123 noted during AM vitals; patient has had elevated diastolic BP throughout shift.  BP rechecked at 0505, was 166/114.  On-call pager has been contacted. See orders.

## 2018-12-22 NOTE — Discharge Instructions (Signed)

## 2018-12-23 ENCOUNTER — Ambulatory Visit (HOSPITAL_COMMUNITY): Admission: RE | Admit: 2018-12-23 | Payer: Medicaid Other | Source: Home / Self Care | Admitting: Oral Surgery

## 2018-12-23 ENCOUNTER — Ambulatory Visit: Payer: Medicaid Other | Admitting: Internal Medicine

## 2018-12-23 LAB — PATHOLOGIST SMEAR REVIEW

## 2018-12-23 SURGERY — DENTAL RESTORATION/EXTRACTIONS
Anesthesia: General | Laterality: Bilateral

## 2018-12-23 NOTE — Telephone Encounter (Signed)
Error

## 2018-12-26 ENCOUNTER — Other Ambulatory Visit: Payer: Self-pay | Admitting: Hematology and Oncology

## 2018-12-26 ENCOUNTER — Telehealth: Payer: Self-pay

## 2018-12-26 DIAGNOSIS — D61818 Other pancytopenia: Secondary | ICD-10-CM

## 2018-12-26 DIAGNOSIS — J370 Chronic laryngitis: Secondary | ICD-10-CM | POA: Insufficient documentation

## 2018-12-26 NOTE — Telephone Encounter (Signed)
Transition Care Management Follow-up Telephone Call  Date of discharge and from where: 12/22/2018, [facility]   How have you been since you were released from the hospital? She said she is feeling " pretty good."   Any questions or concerns? No questions/concerns at this time.   Items Reviewed:  Did the pt receive and understand the discharge instructions provided? Yes, she has the instructions and stated that she has no questions   Medications obtained and verified? She said that she doesn't have the  antibiotic and prednisone yet and is waiting for delivery from [place].  She stated that she saw ENT on 12/23/2018 and was prescribed an antibiotic.  She said that she has the list of medications and did not need to review the list/instructions . No questions at this time.   Any new allergies since your discharge? None reported at this time   Dietary orders reviewed? Yes, her diet is mechanical soft, thin liquids.  She said that she it " doing fine " with eating and tries to eat small bites  Do you have support at home? Yes   Other (ie: DME, Home Health, etc) no home health ordered.   No DME  Functional Questionnaire: (I = Independent and D = Dependent) ADL's:independent  Follow up appointments reviewed:    PCP Hospital f/u appt confirmed? Appointment 12/28/2018  With walk in provider.   [facility] f/u appt confirmed? She sent to ENT last week.  Appointment with oncology tomorrow.   Are transportation arrangements needed?no issues reported   If their condition worsens, is the pt aware to call  their PCP or go to the ED? yes  Was the patient provided with contact information for the PCP's office or ED? She has the phone number for the clinic  Was the pt encouraged to call back with questions or concerns? yes

## 2018-12-27 ENCOUNTER — Other Ambulatory Visit: Payer: Self-pay

## 2018-12-27 ENCOUNTER — Inpatient Hospital Stay: Payer: Medicaid Other | Attending: Hematology and Oncology | Admitting: Hematology and Oncology

## 2018-12-27 ENCOUNTER — Inpatient Hospital Stay: Payer: Medicaid Other

## 2018-12-27 ENCOUNTER — Telehealth: Payer: Self-pay | Admitting: *Deleted

## 2018-12-27 ENCOUNTER — Encounter: Payer: Self-pay | Admitting: Hematology and Oncology

## 2018-12-27 DIAGNOSIS — F418 Other specified anxiety disorders: Secondary | ICD-10-CM | POA: Diagnosis not present

## 2018-12-27 DIAGNOSIS — K029 Dental caries, unspecified: Secondary | ICD-10-CM

## 2018-12-27 DIAGNOSIS — Z7952 Long term (current) use of systemic steroids: Secondary | ICD-10-CM | POA: Insufficient documentation

## 2018-12-27 DIAGNOSIS — Z8673 Personal history of transient ischemic attack (TIA), and cerebral infarction without residual deficits: Secondary | ICD-10-CM | POA: Insufficient documentation

## 2018-12-27 DIAGNOSIS — F17210 Nicotine dependence, cigarettes, uncomplicated: Secondary | ICD-10-CM | POA: Insufficient documentation

## 2018-12-27 DIAGNOSIS — I5032 Chronic diastolic (congestive) heart failure: Secondary | ICD-10-CM | POA: Insufficient documentation

## 2018-12-27 DIAGNOSIS — E785 Hyperlipidemia, unspecified: Secondary | ICD-10-CM | POA: Insufficient documentation

## 2018-12-27 DIAGNOSIS — I110 Hypertensive heart disease with heart failure: Secondary | ICD-10-CM | POA: Insufficient documentation

## 2018-12-27 DIAGNOSIS — M359 Systemic involvement of connective tissue, unspecified: Secondary | ICD-10-CM

## 2018-12-27 DIAGNOSIS — D61818 Other pancytopenia: Secondary | ICD-10-CM

## 2018-12-27 DIAGNOSIS — Z7901 Long term (current) use of anticoagulants: Secondary | ICD-10-CM | POA: Diagnosis not present

## 2018-12-27 DIAGNOSIS — Z79899 Other long term (current) drug therapy: Secondary | ICD-10-CM | POA: Insufficient documentation

## 2018-12-27 DIAGNOSIS — I2510 Atherosclerotic heart disease of native coronary artery without angina pectoris: Secondary | ICD-10-CM | POA: Diagnosis not present

## 2018-12-27 DIAGNOSIS — I272 Pulmonary hypertension, unspecified: Secondary | ICD-10-CM | POA: Diagnosis not present

## 2018-12-27 HISTORY — DX: Systemic involvement of connective tissue, unspecified: M35.9

## 2018-12-27 LAB — CBC WITH DIFFERENTIAL/PLATELET
Abs Immature Granulocytes: 0.07 10*3/uL (ref 0.00–0.07)
Basophils Absolute: 0 10*3/uL (ref 0.0–0.1)
Basophils Relative: 1 %
Eosinophils Absolute: 0.1 10*3/uL (ref 0.0–0.5)
Eosinophils Relative: 2 %
HCT: 31.4 % — ABNORMAL LOW (ref 36.0–46.0)
Hemoglobin: 9.7 g/dL — ABNORMAL LOW (ref 12.0–15.0)
Immature Granulocytes: 2 %
Lymphocytes Relative: 64 %
Lymphs Abs: 2.8 10*3/uL (ref 0.7–4.0)
MCH: 25.7 pg — ABNORMAL LOW (ref 26.0–34.0)
MCHC: 30.9 g/dL (ref 30.0–36.0)
MCV: 83.3 fL (ref 80.0–100.0)
Monocytes Absolute: 0.9 10*3/uL (ref 0.1–1.0)
Monocytes Relative: 21 %
Neutro Abs: 0.4 10*3/uL — CL (ref 1.7–7.7)
Neutrophils Relative %: 10 %
Platelets: 288 10*3/uL (ref 150–400)
RBC: 3.77 MIL/uL — ABNORMAL LOW (ref 3.87–5.11)
RDW: 16.8 % — ABNORMAL HIGH (ref 11.5–15.5)
WBC: 4.3 10*3/uL (ref 4.0–10.5)
nRBC: 0 % (ref 0.0–0.2)

## 2018-12-27 LAB — SEDIMENTATION RATE: Sed Rate: 68 mm/hr — ABNORMAL HIGH (ref 0–22)

## 2018-12-27 NOTE — Assessment & Plan Note (Signed)
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

## 2018-12-27 NOTE — Telephone Encounter (Signed)
CRITICAL VALUE STICKER  CRITICAL VALUE: ANC = 0.4.  RECEIVER (on-site recipient of call): Kolbi Tofte Winston-Sprui[place] RN, Triage [facility].   DATE & TIME NOTIFIED: 12/27/2018 at 1137.   MESSENGER (representative from lab): Pam MT [facility] Lab.  MD NOTIFIED: Dr. Alvy Bimler.  TIME OF NOTIFICATION: 12/27/2018 at 1152.  RESPONSE: None.

## 2018-12-27 NOTE — Assessment & Plan Note (Signed)
She has poor dentition due to chronic smoking She expressed desire to have all her teeth extracted With improvement of her blood count, she should be able to proceed with extraction in the near future

## 2018-12-27 NOTE — Progress Notes (Signed)
[facility] CONSULT NOTE  Patient Care Team: Morgan Rakes, MD as PCP - General (Family Medicine) Morgan Latch, MD as PCP - Cardiology (Cardiology)  ASSESSMENT & PLAN:  Pancytopenia, acquired [facility]) The patient had extensive work-up recently in the hospital She was found to have positive ANA screen along with rheumatoid factor screen She was started on prednisone therapy with significant improvement of her blood count Even though she is still mildly neutropenic, with the improvement seen, I expect her blood count to continue to improve Her anemia is multifactorial, likely anemia of chronic disease along with component of iron deficiency I recommend oral iron supplement  Dental caries She has poor dentition due to chronic smoking She expressed desire to have all her teeth extracted With improvement of her blood count, she should be able to proceed with extraction in the near future  Autoimmune disease ([name]) She has nonspecific autoimmune disorder with positive ANA and rheumatoid factor screening test She was started on prednisone therapy with significant improvement of her white count Her neutropenia is resolving She will see her primary care doctor tomorrow I recommend referral to see rheumatologist I do not manage autoimmune disorder that is associated with pancytopenia   No orders of the defined types were placed in this encounter.    CHIEF COMPLAINTS/PURPOSE OF CONSULTATION:  Recurrent pancytopenia, for further management  HISTORY OF PRESENTING ILLNESS:  Morgan Bean 53 y.o. female is here because of recurrent pancytopenia She was last seen here over 3 years ago for similar problems.  This is a pleasant lady who is being referred here because of background of severe pancytopenia, lymphadenopathy and abnormal lymph node and bone marrow biopsy. Further evaluation suspect the patient may have syphilis. She was treated with doxycycline with worsening  pancytopenia. Her antibiotic treatment was discontinued. From 06/02/2013 to 06/09/2013, the patient was admitted to the hospital for further workup for neutropenic fever. She was placed on G-CSF with no response to therapy. Repeat bone marrow biopsy was done. The patient was given broad-spectrum intravenous antibiotics and subsequently discharged for further followup as an outpatient.  On 06/14/2013, I started her on prednisone 60 mg daily. She was readmitted to the hospital again for neutropenic fever. 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 On 08/04/2013, I advised further prednisone taper. She is given G-CSF for severe neutropenia with no response. She was subsequently observed. In August 2015, she was noted to a recurrence of severe neutropenia. She was sent back on prednisone with initial good response to treatment but then relapsed soon after initiation of prednisone taper  on 02/20/2014, she was given IVIG treatment. On 03/06/2014, prednisone taper is initiated to 2.5 mg daily, subsequently taper off.  She was lost to follow-up due to noncompliance and multiple no-shows She was recently readmitted to the hospital for neutropenic fever She had extensive evaluation including extensive blood work and autoimmune screen She tested positive for ANA and rheumatoid factor She was started on prednisone therapy and subsequently discharged She denies recurrence of fever She continues to smoke She denies recent alcohol use or drug use The patient denies any recent signs or symptoms of bleeding such as spontaneous epistaxis, hematuria or hematochezia.   MEDICAL HISTORY:  Past Medical History:  Diagnosis Date  . Anemia of other chronic disease 11/13/2013  . Anginal pain ([place])    none in past year  . Anxiety   . Asthma   . Candidiasis 06/23/2013  .  Chronic diastolic CHF (congestive heart failure) ([place])   . Constipation   . Coronary  artery disease    Lexiscan myoview (10/12) with significant ST depression upon Lexiscan injection but no ischemia or infarction on perfusion images. Left heart cath (10/12): 30% mid LAD, 30% ostial D1, 50% ostial D2.    [name] Cough 11/13/2013  . DCIS (ductal carcinoma in situ) of breast   . Depression   . Headache 02/22/2014  . Hx of cardiovascular stress test    Lexiscan Myoview (2/16): Breast attenuation artifact, no ischemia, EF 64%; low risk  . Hyperlipemia   . Hypertension   . Iron deficiency    hx of  . Leukocytoclastic vasculitis ([place])    Rash across lower body, occurred in 9/11, diagnosed by skin biopsy. ANA positive. Thought to be secondary to cocaine use.  . Leukopenia 03/21/2013  . Lymphadenopathy 03/21/2013  . Lymphadenopathy 01/30/2014  . Mental disorder   . Pancytopenia ([place])   . Pancytopenia, acquired ([place]) 07/16/2015  . Polysubstance abuse ([place])    Prior cocaine  . Pulmonary HTN ([place])    Echo (9/11) with EF 65%, mild LVH, mild AI, mild MR, moderate to possibly severe TR with PA systolic pressure 52 mmHg. Echo (10/12): severe LV hypertrophy, EF 60-65%, mild MR, mild AI, moderate to severe tricuspid regurgitation, PA systolic pressure 38 mmHg.  Echo 4/14: moderate LVH, EF 65%, normal wall motion, diastolic dysfunction, mild AI, mild MR  . Shortness of breath    a. PFTs 5/14:  FEV1/FVC 99% predicted; FVC 60% predicted; DLCO mildly reduced, mild restriction, air trapping.;  b.  seen by pulmo (Dr. [name]) 5/14: mainly upper airway symptoms - ACE d/c'd (sx's better)  . Stroke ([place]) 2010ish  . Syphilis 04/25/2013  . Tobacco abuse   . Tricuspid regurgitation   . Unspecified deficiency anemia 03/29/2013    SURGICAL HISTORY: Past Surgical History:  Procedure Laterality Date  . ANKLE SURGERY     right  . BONE MARROW ASPIRATION    . CARDIAC CATHETERIZATION    . CARDIAC CATHETERIZATION N/A 04/23/2015   Procedure: Right Heart Cath;  Surgeon: [name], MD;  Location: [facility]  CV LAB;  Service: Cardiovascular;  Laterality: N/A;  . CARDIOVERSION N/A 03/11/2018   Procedure: CARDIOVERSION;  Surgeon: [name], MD;  Location: [facility] ENDOSCOPY;  Service: Cardiovascular;  Laterality: N/A;  . I&D EXTREMITY  08/09/2011   Procedure: IRRIGATION AND DEBRIDEMENT EXTREMITY;  Surgeon: [name], MD;  Location: [facility];  Service: General;  Laterality: Left;  i & D Left axilla abscess  . LYMPH NODE BIOPSY N/A 04/05/2013   Procedure: EXCISIONAL BIOPSY RIGHT SUBMANDIBULAR NODE, NASAL ENDOSCOPIC WITH BIOPSY NASAL PHARYNX;  Surgeon: [name], MD;  Location: [facility];  Service: ENT;  Laterality: N/A;  . TEE WITHOUT CARDIOVERSION N/A 03/11/2018   Procedure: TRANSESOPHAGEAL ECHOCARDIOGRAM (TEE);  Surgeon: [name], MD;  Location: [facility] ENDOSCOPY;  Service: Cardiovascular;  Laterality: N/A;    SOCIAL HISTORY: Social History   Socioeconomic History  . Marital status: Single    Spouse name: Not on file  . Number of children: 2  . Years of education: Not on file  . Highest education level: Not on file  Occupational History  . Occupation: unemployed  Social Needs  . Financial resource strain: Not on file  . Food insecurity    Worry: Not on file    Inability: Not on file  . Transportation needs    Medical: Not on  file    Non-medical: Not on file  Tobacco Use  . Smoking status: Current Every Day Smoker    Packs/day: 2.00    Years: 30.00    Pack years: 60.00    Types: Cigarettes  . Smokeless tobacco: Never Used  . Tobacco comment: 1/2 ppd 10/10/18  Substance and Sexual Activity  . Alcohol use: No    Alcohol/week: 0.0 standard drinks    Comment: pint of wine 2-3 times a week--ocassional - quit 2012  . Drug use: No    Frequency: 1.0 times per week    Types: "Crack" cocaine    Comment: none since 02/04/14  . Sexual activity: Yes  Lifestyle  . Physical activity    Days per week: Not on file    Minutes per session: Not on file  . Stress: Not on file  Relationships  .  Social Herbalist on phone: Not on file    Gets together: Not on file    Attends religious service: Not on file    Active member of club or organization: Not on file    Attends meetings of clubs or organizations: Not on file    Relationship status: Not on file  . Intimate partner violence    Fear of current or ex partner: Not on file    Emotionally abused: Not on file    Physically abused: Not on file    Forced sexual activity: Not on file  Other Topics Concern  . Not on file  Social History Narrative   Lives with mom, sisters and brothers.    FAMILY HISTORY: Family History  Problem Relation Age of Onset  . Coronary artery disease Mother   . Diabetes Mother   . Diabetes Father   . Hypertension Father   . Coronary artery disease Father   . Heart attack Father   . Breast cancer Maternal Grandmother   . Diabetes Maternal Grandmother   . Heart attack Maternal Grandmother   . Stroke Maternal Grandmother   . Asthma Grandson     ALLERGIES:  is allergic to ciprofloxacin.  MEDICATIONS:  Current Outpatient Medications  Medication Sig Dispense Refill  . amiodarone (PACERONE) 200 MG tablet Take 1 tablet (200 mg total) by mouth daily. 90 tablet 2  . atorvastatin (LIPITOR) 40 MG tablet Take 1 tablet (40 mg total) by mouth at bedtime. 30 tablet 6  . buPROPion (WELLBUTRIN SR) 150 MG 12 hr tablet Take 1 tablet (150 mg total) by mouth 2 (two) times daily. 60 tablet 2  . citalopram (CELEXA) 40 MG tablet Take 1 tablet (40 mg total) by mouth daily. 30 tablet 2  . cloNIDine (CATAPRES) 0.1 MG tablet Take 1 tablet (0.1 mg total) by mouth 2 (two) times daily. MUST KEEP APPT ON 11/07/18 FOR FURTHER REFILLS 60 tablet 0  . diltiazem (CARDIZEM CD) 180 MG 24 hr capsule Take 1 capsule (180 mg total) by mouth daily. 60 capsule 2  . furosemide (LASIX) 40 MG tablet Take 1 tablet (40 mg total) by mouth 2 (two) times daily. 90 tablet 3  . lisinopril (PRINIVIL,ZESTRIL) 10 MG tablet Take 1 tablet  (10 mg total) by mouth daily. 90 tablet 3  . loratadine (CLARITIN) 10 MG tablet Take 1 tablet (10 mg total) by mouth daily as needed for allergies. 90 tablet 1  . predniSONE (DELTASONE) 10 MG tablet Take 4 tablets (40 mg total) by mouth daily with breakfast for 3 days, THEN 2 tablets (20 mg total) daily  with breakfast for 3 days, THEN 1 tablet (10 mg total) daily with breakfast for 3 days, THEN 0.5 tablets (5 mg total) daily with breakfast for 4 days. 23 tablet 0  . rivaroxaban (XARELTO) 20 MG TABS tablet Take 1 tablet (20 mg total) by mouth daily with supper. 30 tablet 0  . topiramate (TOPAMAX) 25 MG tablet Take 2 tablets (50 mg total) by mouth at bedtime. 60 tablet 2  . traZODone (DESYREL) 150 MG tablet Take 1 tablet (150 mg total) by mouth at bedtime. 30 tablet 2   No current facility-administered medications for this visit.     REVIEW OF SYSTEMS:   Constitutional: Denies fevers, chills or abnormal night sweats Eyes: Denies blurriness of vision, double vision or watery eyes Ears, nose, mouth, throat, and face: Denies mucositis or sore throat Respiratory: Denies cough, dyspnea or wheezes Cardiovascular: Denies palpitation, chest discomfort or lower extremity swelling Gastrointestinal:  Denies nausea, heartburn or change in bowel habits Skin: Denies abnormal skin rashes Lymphatics: Denies new lymphadenopathy or easy bruising Neurological:Denies numbness, tingling or new weaknesses Behavioral/Psych: Mood is stable, no new changes  All other systems were reviewed with the patient and are negative.  PHYSICAL EXAMINATION: ECOG PERFORMANCE STATUS: 1 - Symptomatic but completely ambulatory  Vitals:   12/27/18 1108  BP: (!) 159/103  Pulse: 86  Resp: 18  Temp: 98.2 F (36.8 C)  SpO2: 100%   Filed Weights   12/27/18 1108  Weight: 203 lb 14.4 oz (92.5 kg)    GENERAL:alert, no distress and comfortable SKIN: skin color, texture, turgor are normal, no rashes or significant lesions EYES:  normal, conjunctiva are pink and non-injected, sclera clear OROPHARYNX:no exudate, no erythema and lips, buccal mucosa, and tongue normal.  Poor dentition is noted NECK: supple, thyroid normal size, non-tender, without nodularity LYMPH: She has mild palpable lymphadenopathy in the neck LUNGS: clear to auscultation and percussion with normal breathing effort HEART: regular rate & rhythm and no murmurs and no lower extremity edema ABDOMEN:abdomen soft, non-tender and normal bowel sounds Musculoskeletal:no cyanosis of digits and no clubbing  PSYCH: alert & oriented x 3 with fluent speech NEURO: no focal motor/sensory deficits  LABORATORY DATA:  I have reviewed the data as listed Lab Results  Component Value Date   WBC 4.3 12/27/2018   HGB 9.7 (L) 12/27/2018   HCT 31.4 (L) 12/27/2018   MCV 83.3 12/27/2018   PLT 288 12/27/2018   Recent Labs    05/23/18 1210 12/16/18 1335  12/20/18 0206 12/21/18 0258 12/22/18 0233  NA 140 135   < > 139 137 137  K 4.5 3.6   < > 4.1 4.2 4.2  CL 106 100   < > 112* 110 112*  CO2 19* 22   < > 22 18* 17*  GLUCOSE 84 129*   < > 102* 138* 123*  BUN 21 14   < > 23* 16 20  CREATININE 1.45* 1.44*   < > 1.32* 0.96 1.03*  CALCIUM 9.4 8.8*   < > 8.3* 8.6* 8.9  GFRNONAA 41* 41*   < > 45* >60 >60  GFRAA 47* 47*   < > 53* >60 >60  PROT 8.5 7.8  --   --   --   --   ALBUMIN 4.3 3.4*  --   --   --   --   AST 19 76*  --   --   --   --   ALT 25 52*  --   --   --   --  ALKPHOS 117 75  --   --   --   --   BILITOT 0.3 1.9*  --   --   --   --    < > = values in this interval not displayed.    RADIOGRAPHIC STUDIES: I have personally reviewed the radiological images as listed and agreed with the findings in the report. Ct Soft Tissue Neck W Contrast  Result Date: 12/20/2018 CLINICAL DATA:  Sore throat/stridor, epiglottitis or tonsillitis suspected. EXAM: CT NECK WITH CONTRAST TECHNIQUE: Multidetector CT imaging of the neck was performed using the standard protocol  following the bolus administration of intravenous contrast. CONTRAST:  38m OMNIPAQUE IOHEXOL 300 MG/ML  SOLN COMPARISON:  Neck CT 12/16/2018 FINDINGS: Pharynx and larynx: Poor dentition with multiple absent teeth, periapical lucencies and dental caries. There is persistent symmetric swelling and hyperenhancement and edematous changes involved the bilateral palatine tonsil. Interval decrease in swelling of the bilateral lingual tonsils and epiglottis. Persistent diffuse mucosal edema seen throughout the adjacent oropharynx, extending inferiorly to involve the supraglottic larynx and hypopharynx, suggesting associated pharyngitis/supraglottitis. Persistent masslike thickening and edema of the hypopharynx with associated enhancement at the level of the piriform sinuses, right greater than left. Narrowing of the supraglottic airway appears progressed from prior exam, now moderate (series 3, image 57) (series 5, image 36). Glottis within normal limits inferiorly. Subglottic airway without appreciable mass or swelling. No organized fluid collection is identified. Associated hazy inflammatory stranding and induration within the adjacent parapharyngeal space bilaterally and extending into the submandibular and submental spaces, similar to prior exam. Salivary glands: Parotid glands are unremarkable. Inflammatory stranding surrounding the bilateral submandibular glands, although the submandibular glands appear intrinsically unremarkable. Thyroid: Unremarkable Lymph nodes: Persistent bilateral cervical lymphadenopathy, most prominent at the bilateral level II/III stations. Vascular: The major vascular structures of the neck appear patent. Atherosclerotic calcification within the bilateral carotid and vertebral artery systems. Both cervical internal carotid arteries are somewhat medialized into the retropharyngeal space. Limited intracranial: Atherosclerotic calcification of the carotid artery siphons. Otherwise unremarkable.  Visualized orbits: No acute abnormality. Mastoids and visualized paranasal sinuses: Visualized paranasal sinuses are clear. Visualized mastoid air cells are clear. Skeleton: No acute bony abnormality. Upper chest: No consolidation within the imaged lung apices. Bilateral dependent atelectasis These results were called by telephone at the time of interpretation on 12/20/2018 at 2:39 pm to provider ALenice Pressman, who verbally acknowledged these results. IMPRESSION: 1. Again demonstrated are findings consistent with acute pharyngitis, tonsillitis and supraglottitis. Although swelling of the epiglottis and lingual tonsils has decreased, the remainder of the supraglottic swelling is similar or even subtly increased. Associated narrowing of the supraglottic airway appears progressed, now moderate. No organized drainable collection identified. Associated masslike thickening and edema of the hypopharynx at the level of the piriform sinus appears similar to prior exam, and likely reflects acute inflammation, although underlying mass could have this appearance. ENT follow-up with imaging follow-up to resolution recommended. 2. Persistent bilateral cervical lymphadenopathy, likely reactive. Electronically Signed   By: KKellie Simmering  On: 12/20/2018 14:39   Ct Soft Tissue Neck W Contrast  Result Date: 12/16/2018 CLINICAL DATA:  Initial evaluation for acute sore throat, stridor EXAM: CT NECK WITH CONTRAST TECHNIQUE: Multidetector CT imaging of the neck was performed using the standard protocol following the bolus administration of intravenous contrast. CONTRAST:  79mOMNIPAQUE IOHEXOL 300 MG/ML  SOLN COMPARISON:  None. FINDINGS: Pharynx and larynx: Oral cavity within normal limits. Poor dentition with innumerable dental caries and periapical lucencies  noted. No acute inflammatory changes seen about the teeth. Symmetric hypertrophy with hyperenhancement and edematous changes seen involving the palatine and lingual tonsils  bilaterally, suggesting acute tonsillitis. Associated diffuse mucosal edema seen throughout the adjacent oropharynx, extending inferiorly to involve the supraglottic larynx and hypopharynx, suggesting associated pharyngitis. There is involvement of the epiglottis which is thickened and edematous (series 6, image 82). Extension into the aryepiglottic folds. There is masslike thickening and edema of the hypopharynx at the level of the piriform sinuses, right greater than left (series 3, image 57). Secondary mild narrowing of the supraglottic airway which remains patent at this time. Glottis within normal limits inferiorly. Subglottic airway clear. No abscess or drainable fluid collection. Associated hazy inflammatory stranding and induration within the adjacent parapharyngeal space bilaterally, extending into the submandibular and submental region as well. Salivary glands: Salivary glands including the parotid and submandibular glands within normal limits. Thyroid: Unremarkable. Lymph nodes: Enlarged bilateral level II/III nodes, measuring up to 17 mm on the left and 14 mm on the right. Findings presumably reactive. Vascular: Normal intravascular enhancement seen throughout the neck. Moderate atherosclerotic change about the carotid bifurcations without high-grade stenosis. Both carotid artery systems somewhat medialized into the retropharyngeal space, greater on the left. Limited intracranial: Unremarkable. Visualized orbits: Unremarkable. Mastoids and visualized paranasal sinuses: Visualized paranasal sinuses are clear. Mastoid air cells and middle ear cavities well pneumatized and free of fluid. Skeleton: Mild to moderate cervical spondylolysis at C5-6 and C6-7. Upper chest: Visualized upper chest demonstrates no acute finding. Partially visualized lungs are grossly clear. Other: None. IMPRESSION: 1. Findings consistent with acute supraglottitis with involvement of the epiglottis and narrowing of the supraglottic  airway. Patient felt to be at risk for airway compromise. No abscess or drainable fluid collection. Associated mass-like thickening and edema of the hypopharynx at the level of the piriform sinuses, likely reflecting acute inflammation, although an underlying mass could also have this appearance. ENT follow-up with repeat imaging following convalescence to ensure these changes resolve is recommended. 2. Enlarged bilateral level II/III adenopathy, likely reactive. 3. Poor dentition. Critical Value/emergent results were called by telephone at the time of interpretation on 12/16/2018 at 11:42 pm to providerBRIAN MILLER , who verbally acknowledged these results. Electronically Signed   By: Jeannine Boga M.D.   On: 12/16/2018 23:45   Dg Chest Portable 1 View  Result Date: 12/16/2018 CLINICAL DATA:  shortness of breath, sore throat and fever that started yesterday- has not been around anyone with covid that she is aware of. EXAM: PORTABLE CHEST 1 VIEW COMPARISON:  Chest radiograph 04/25/2018 FINDINGS: Stable cardiomediastinal contours with enlarged heart size. Small bandlike opacity at the right lung base favored to represent atelectasis or scarring. No focal infiltrate identified. No pneumothorax or pleural effusion. No acute finding in the visualized skeleton. IMPRESSION: Small bandlike opacity at the right lung base favored to represent atelectasis or scarring. No other acute finding. Electronically Signed   By: Audie Pinto M.D.   On: 12/16/2018 16:34    I spent 25 minutes counseling the patient face to face. The total time spent in the appointment was 30 minutes and more than 50% was on counseling.  All questions were answered. The patient knows to call the clinic with any problems, questions or concerns.  Heath Lark, MD 12/27/2018 12:07 PM

## 2018-12-27 NOTE — Assessment & Plan Note (Signed)
The patient had extensive work-up recently in the hospital She was found to have positive ANA screen along with rheumatoid factor screen She was started on prednisone therapy with significant improvement of her blood count Even though she is still mildly neutropenic, with the improvement seen, I expect her blood count to continue to improve Her anemia is multifactorial, likely anemia of chronic disease along with component of iron deficiency I recommend oral iron supplement

## 2018-12-28 ENCOUNTER — Ambulatory Visit: Payer: Medicaid Other | Attending: Family Medicine | Admitting: Family Medicine

## 2018-12-28 ENCOUNTER — Inpatient Hospital Stay: Payer: Medicaid Other

## 2018-12-28 DIAGNOSIS — R768 Other specified abnormal immunological findings in serum: Secondary | ICD-10-CM | POA: Diagnosis not present

## 2018-12-28 DIAGNOSIS — F339 Major depressive disorder, recurrent, unspecified: Secondary | ICD-10-CM | POA: Diagnosis not present

## 2018-12-28 DIAGNOSIS — I4819 Other persistent atrial fibrillation: Secondary | ICD-10-CM

## 2018-12-28 DIAGNOSIS — I10 Essential (primary) hypertension: Secondary | ICD-10-CM

## 2018-12-28 DIAGNOSIS — G4485 Primary stabbing headache: Secondary | ICD-10-CM

## 2018-12-28 DIAGNOSIS — R600 Localized edema: Secondary | ICD-10-CM

## 2018-12-28 DIAGNOSIS — D61818 Other pancytopenia: Secondary | ICD-10-CM

## 2018-12-28 MED ORDER — ATORVASTATIN CALCIUM 40 MG PO TABS: 40.0000 mg | ORAL_TABLET | Freq: Every day | ORAL | 2 refills | Status: DC

## 2018-12-28 MED ORDER — TRAZODONE HCL 150 MG PO TABS: 150.0000 mg | ORAL_TABLET | Freq: Every day | ORAL | 2 refills | Status: DC

## 2018-12-28 MED ORDER — CITALOPRAM HYDROBROMIDE 40 MG PO TABS: 40.0000 mg | ORAL_TABLET | Freq: Every day | ORAL | 2 refills | Status: DC

## 2018-12-28 MED ORDER — DILTIAZEM HCL ER COATED BEADS 180 MG PO CP24: 180.0000 mg | ORAL_CAPSULE | Freq: Every day | ORAL | 2 refills | Status: DC

## 2018-12-28 MED ORDER — CLONIDINE HCL 0.1 MG PO TABS: 0.1000 mg | ORAL_TABLET | Freq: Two times a day (BID) | ORAL | 2 refills | Status: DC

## 2018-12-28 MED ORDER — BUPROPION HCL ER (SR) 150 MG PO TB12: 150.0000 mg | ORAL_TABLET | Freq: Two times a day (BID) | ORAL | 2 refills | Status: DC

## 2018-12-28 MED ORDER — FERROUS SULFATE 325 (65 FE) MG PO TBEC: 325.0000 mg | DELAYED_RELEASE_TABLET | Freq: Three times a day (TID) | ORAL | 3 refills | Status: DC

## 2018-12-28 MED ORDER — TOPIRAMATE 25 MG PO TABS: 50.0000 mg | ORAL_TABLET | Freq: Every day | ORAL | 2 refills | Status: DC

## 2018-12-28 NOTE — Progress Notes (Signed)
Virtual Visit via Telephone Note  I connected with Morgan Bean, on 12/28/2018 at 3:10 PM by telephone due to the COVID-19 pandemic and verified that I am speaking with the correct person using two identifiers.   Consent: I discussed the limitations, risks, security and privacy concerns of performing an evaluation and management service by telephone and the availability of in person appointments. I also discussed with the patient that there may be a patient responsible charge related to this service. The patient expressed understanding and agreed to proceed.   Location of Patient: Home  Location of Provider: Clinic   Persons participating in Telemedicine visit: Sueanne Lechia Delatour Farrington-CMA Dr. Felecia Shelling     History of Present Illness: Morgan Bean is a 53 year old female patient of Dr Wynetta Emery with a history of Hypertension, paroxysmal Afib, CAD, previous CVA, moderate to severe MR seen for a follow up after hospitalization for Neutropenic fever, sepsis at [facility] from 12/16/18-12/22/18.   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 During her hospitalization her labs revealed a positive ANA, RF and ribonucleic protein.Blood cultures were negative. She also received IV antibiotics for supraglotitis. She was subsequently discharged with a prednisone taper with and a WBC of 1.1, Hgb of 9.7,   She had a follow up with [facility] on 10/2 and Augmentin was commenced for her supraglotitis. Hematology visit was on 12/27/18 and Rheumatology referral recommended. Neutropenia had improved to 4.3.  Today she complains of swelling in both lower extremities which started today and she denies weight increase, intake of high sodium foods. Her dyspnea is at baseline and has not worsened. She is unable to check her weight as she has no scale. Last weight was 203lbs (increase of 9lbs in the last 3 months).Denies chest  pain. Echo from 02/2018 revealed EF of 50-55%, moderate to severe MR. She is compliant with her Xarelto. She is requesting refills of her chronic medications. Her Depression is controlled and headaches are controlled.   Past Medical History:  Diagnosis Date  . Anemia of other chronic disease 11/13/2013  . Anginal pain ([place])    none in past year  . Anxiety   . Asthma   . Candidiasis 06/23/2013  . Chronic diastolic CHF (congestive heart failure) ([place])   . Constipation   . Coronary artery disease    Lexiscan myoview (10/12) with significant ST depression upon Lexiscan injection but no ischemia or infarction on perfusion images. Left heart cath (10/12): 30% mid LAD, 30% ostial D1, 50% ostial D2.    [name] Cough 11/13/2013  . DCIS (ductal carcinoma in situ) of breast   . Depression   . Headache 02/22/2014  . Hx of cardiovascular stress test    Lexiscan Myoview (2/16): Breast attenuation artifact, no ischemia, EF 64%; low risk  . Hyperlipemia   . Hypertension   . Iron deficiency    hx of  . Leukocytoclastic vasculitis ([place])    Rash across lower body, occurred in 9/11, diagnosed by skin biopsy. ANA positive. Thought to be secondary to cocaine use.  . Leukopenia 03/21/2013  . Lymphadenopathy 03/21/2013  . Lymphadenopathy 01/30/2014  . Mental disorder   . Pancytopenia ([place])   . Pancytopenia, acquired ([place]) 07/16/2015  . Polysubstance abuse ([place])    Prior cocaine  . Pulmonary HTN ([place])    Echo (9/11) with EF 65%, mild LVH, mild AI, mild MR, moderate to possibly severe TR with PA systolic pressure 52  mmHg. Echo (10/12): severe LV hypertrophy, EF 60-65%, mild MR, mild AI, moderate to severe tricuspid regurgitation, PA systolic pressure 38 mmHg.  Echo 4/14: moderate LVH, EF 65%, normal wall motion, diastolic dysfunction, mild AI, mild MR  . Shortness of breath    a. PFTs 5/14:  FEV1/FVC 99% predicted; FVC 60% predicted; DLCO mildly reduced, mild restriction, air trapping.;  b.  seen by pulmo (Dr.  [name]) 5/14: mainly upper airway symptoms - ACE d/c'd (sx's better)  . Stroke ([place]) 2010ish  . Syphilis 04/25/2013  . Tobacco abuse   . Tricuspid regurgitation   . Unspecified deficiency anemia 03/29/2013   Allergies  Allergen Reactions  . Ciprofloxacin Itching    Current Outpatient Medications on File Prior to Visit  Medication Sig Dispense Refill  . amiodarone (PACERONE) 200 MG tablet Take 1 tablet (200 mg total) by mouth daily. 90 tablet 2  . atorvastatin (LIPITOR) 40 MG tablet Take 1 tablet (40 mg total) by mouth at bedtime. 30 tablet 6  . buPROPion (WELLBUTRIN SR) 150 MG 12 hr tablet Take 1 tablet (150 mg total) by mouth 2 (two) times daily. 60 tablet 2  . citalopram (CELEXA) 40 MG tablet Take 1 tablet (40 mg total) by mouth daily. 30 tablet 2  . cloNIDine (CATAPRES) 0.1 MG tablet Take 1 tablet (0.1 mg total) by mouth 2 (two) times daily. MUST KEEP APPT ON 11/07/18 FOR FURTHER REFILLS 60 tablet 0  . diltiazem (CARDIZEM CD) 180 MG 24 hr capsule Take 1 capsule (180 mg total) by mouth daily. 60 capsule 2  . furosemide (LASIX) 40 MG tablet Take 1 tablet (40 mg total) by mouth 2 (two) times daily. 90 tablet 3  . loratadine (CLARITIN) 10 MG tablet Take 1 tablet (10 mg total) by mouth daily as needed for allergies. 90 tablet 1  . predniSONE (DELTASONE) 10 MG tablet Take 4 tablets (40 mg total) by mouth daily with breakfast for 3 days, THEN 2 tablets (20 mg total) daily with breakfast for 3 days, THEN 1 tablet (10 mg total) daily with breakfast for 3 days, THEN 0.5 tablets (5 mg total) daily with breakfast for 4 days. 23 tablet 0  . rivaroxaban (XARELTO) 20 MG TABS tablet Take 1 tablet (20 mg total) by mouth daily with supper. 30 tablet 0  . topiramate (TOPAMAX) 25 MG tablet Take 2 tablets (50 mg total) by mouth at bedtime. 60 tablet 2  . traZODone (DESYREL) 150 MG tablet Take 1 tablet (150 mg total) by mouth at bedtime. 30 tablet 2  . lisinopril (PRINIVIL,ZESTRIL) 10 MG tablet Take 1 tablet (10  mg total) by mouth daily. 90 tablet 3   No current facility-administered medications on file prior to visit.     Observations/Objective: Alert, awake, oriented x3 Not in acute distress  Assessment and Plan: 1. Depression, recurrent ([place]) Stable - traZODone (DESYREL) 150 MG tablet; Take 1 tablet (150 mg total) by mouth at bedtime.  Dispense: 30 tablet; Refill: 2 - citalopram (CELEXA) 40 MG tablet; Take 1 tablet (40 mg total) by mouth daily.  Dispense: 30 tablet; Refill: 2 - buPROPion (WELLBUTRIN SR) 150 MG 12 hr tablet; Take 1 tablet (150 mg total) by mouth 2 (two) times daily.  Dispense: 60 tablet; Refill: 2  2. Primary stabbing headache Controlled - topiramate (TOPAMAX) 25 MG tablet; Take 2 tablets (50 mg total) by mouth at bedtime.  Dispense: 60 tablet; Refill: 2  3. Essential hypertension Stable Counseled on blood pressure goal of less than 130/80,  low-sodium, DASH diet, medication compliance, 150 minutes of moderate intensity exercise per week. Discussed medication compliance, adverse effects. - diltiazem (CARDIZEM CD) 180 MG 24 hr capsule; Take 1 capsule (180 mg total) by mouth daily.  Dispense: 60 capsule; Refill: 2 - cloNIDine (CATAPRES) 0.1 MG tablet; Take 1 tablet (0.1 mg total) by mouth 2 (two) times daily.  Dispense: 60 tablet; Refill: 2  4. Other persistent atrial fibrillation ([name]) On anticoagulation with Xarelto - diltiazem (CARDIZEM CD) 180 MG 24 hr capsule; Take 1 capsule (180 mg total) by mouth daily.  Dispense: 60 capsule; Refill: 2  5. Rheumatoid factor positive - Ambulatory referral to Rheumatology  6. Pancytopenia ([place]) Neutropenia has improved Platelets are normal I have sent a rx for Ferrous sulfate to her Pharmacy  7. Pedal edema Advised to take an extra Lasix pill today and tomorrow Work on low sodium diet and elevate feet   Follow Up Instructions: Return in about 1 month (around 01/28/2019) for PCP - Dr Wynetta Emery for follow up of pedal edema.     I discussed the assessment and treatment plan with the patient. The patient was provided an opportunity to ask questions and all were answered. The patient agreed with the plan and demonstrated an understanding of the instructions.   The patient was advised to call back or seek an in-person evaluation if the symptoms worsen or if the condition fails to improve as anticipated.     I provided 25 minutes total of non-face-to-face time during this encounter including median intraservice time, reviewing previous notes, labs, imaging, medications, management and patient verbalized understanding.     [name], MD, FAAFP. [facility] [place], [place]   12/28/2018, 3:10 PM

## 2018-12-28 NOTE — Progress Notes (Signed)
Patient has been called and DOB has been verified. Patient has been screened and transferred to PCP to start phone visit.     

## 2018-12-29 LAB — COPPER, SERUM: Copper: 168 ug/dL — ABNORMAL HIGH (ref 72–166)

## 2018-12-30 LAB — MISC LABCORP TEST (SEND OUT): Labcorp test code: 9985

## 2019-01-06 ENCOUNTER — Other Ambulatory Visit: Payer: Self-pay

## 2019-01-06 ENCOUNTER — Encounter: Payer: Self-pay | Admitting: Cardiovascular Disease

## 2019-01-06 ENCOUNTER — Ambulatory Visit (INDEPENDENT_AMBULATORY_CARE_PROVIDER_SITE_OTHER): Payer: Medicaid Other | Admitting: Cardiovascular Disease

## 2019-01-06 VITALS — BP 128/86 | HR 76 | Temp 96.8°F | Ht 66.0 in | Wt 205.6 lb

## 2019-01-06 DIAGNOSIS — I340 Nonrheumatic mitral (valve) insufficiency: Secondary | ICD-10-CM

## 2019-01-06 DIAGNOSIS — E7800 Pure hypercholesterolemia, unspecified: Secondary | ICD-10-CM

## 2019-01-06 DIAGNOSIS — I10 Essential (primary) hypertension: Secondary | ICD-10-CM

## 2019-01-06 DIAGNOSIS — F1910 Other psychoactive substance abuse, uncomplicated: Secondary | ICD-10-CM

## 2019-01-06 DIAGNOSIS — I4819 Other persistent atrial fibrillation: Secondary | ICD-10-CM | POA: Diagnosis not present

## 2019-01-06 MED ORDER — NICOTINE 7 MG/24HR TD PT24: 7.0000 mg | MEDICATED_PATCH | Freq: Every day | TRANSDERMAL | 0 refills | Status: DC

## 2019-01-06 MED ORDER — NICOTINE 14 MG/24HR TD PT24: 14.0000 mg | MEDICATED_PATCH | Freq: Every day | TRANSDERMAL | 0 refills | Status: DC

## 2019-01-06 MED ORDER — NICOTINE 21 MG/24HR TD PT24: 21.0000 mg | MEDICATED_PATCH | Freq: Every day | TRANSDERMAL | 0 refills | Status: DC

## 2019-01-06 NOTE — Patient Instructions (Addendum)
Medication Instructions:  START NICOTINE PATCHES 21 MG DAILY FOR 6 WEEKS, 14 MG FOR 2 WEEKS, AND 7 MG FOR 2 WEEKS  *If you need a refill on your cardiac medications before your next appointment, please call your pharmacy*  Lab Work: NONE   Testing/Procedures: Your physician has requested that you have an echocardiogram. Echocardiography is a painless test that uses sound waves to create images of your heart. It provides your doctor with information about the size and shape of your heart and how well your heart's chambers and valves are working. This procedure takes approximately one hour. There are no restrictions for this procedure. [address] [address]  Follow-Up: At [facility], you and your health needs are our priority.  As part of our continuing mission to provide you with exceptional heart care, we have created designated Provider Care Teams.  These Care Teams include your primary Cardiologist (physician) and Advanced Practice Providers (APPs -  Physician Assistants and Nurse Practitioners) who all work together to provide you with the care you need, when you need it.  Your next appointment:   4 months You will receive a reminder letter in the mail two months in advance. If you don't receive a letter, please call our office to schedule the follow-up appointment.   The format for your next appointment:   Either In Person or Virtual  Provider:   You may see [name], MD   or one of the following Advanced Practice Providers on your designated Care Team:    Kerin Ransom, PA-C  [place], [place]  Coletta Memos, [place]

## 2019-01-06 NOTE — Progress Notes (Signed)
Cardiology Office Note   Date:  01/06/2019   ID:  Morgan Bean, DOB 04-Mar-1966, MRN [id number]  PCP:  [name], MD  Cardiologist:   [name], MD   No chief complaint on file.    History of Present Illness: Morgan Bean is a 53 y.o. female with chronic diastolic heart failure, mild pulmonary hypertension, prior CVA, CAD, atrial flutter s/p ablation, COPD, OSA not on CPAP, polysubstance abuse, depression, and anxiety here for follow up.  She was admitted 02/2018 with acute on chronic diastolic heart failure and atrial fibrillation/flutter with RVR.  She presented with 4 days of shortness of breath that occurred in the setting of heavy cocaine abuse.  She had not been taking her furosemide and reported that she often forgets to take her medications.  Echo that admission revealed LVEF 50 to 55% with mild aortic regurgitation and moderate to severe mitral regurgitation.  She was started on diltiazem and Xarelto.    Morgan Bean was seen in the ED 04/2018.  She initially came to see our pharmacist and reported shortness of breath, dizziness and chest pain.  There was concern for hypoxia.  EKG showed atrial flutter. She was referred to the ED where cardiac enzymes were negative and EKG was stable.  She has a CXR that showed no acute process.  Urine tox was positive for amphetamines though she denied any use of illegal or legal amphetamines.  She had AKI with creatinine elevated to 1.7.  She was instructed to hold lisinopril for 5 days.      She had a cardioversion that was initially successful but did not maintain.  At her last appointment she was started on amiodarone and subsequently maintained sinus rhythm.  She followed up with Kerin Ransom PA-C on 06/2018 and was doing well.  She saw Dr. Melvyn Novas due to concern that amiodarone may affect her respiratory status.  However she was deemed okay to continue on amiodarone.  She was admitted 11/2018 for epiglotitis and neutropenic fever.  Since being  discharged she has been feeling better.  Her breathing has been stable.  She has some lower extremity edema at the end of the day that improves with elevation of her legs.  She has no orthopnea or PND.  She did have some atrial fibrillation in the hospital.  Diltiazem was transiently held due to hypotension.  Once this was restarted her rate was well-controlled.  She has not experienced any lately.  She denies any chest pain or pressure.  Overall she is doing well.  She continues to smoke 1 pack of cigarettes daily and is interested in quitting.  She continue to abstain from illicits.   Past Medical History:  Diagnosis Date   Anemia of other chronic disease 11/13/2013   Anginal pain ([place])    none in past year   Anxiety    Asthma    Candidiasis 06/23/2013   Chronic diastolic CHF (congestive heart failure) ([name])    Constipation    Coronary artery disease    Lexiscan myoview (10/12) with significant ST depression upon Lexiscan injection but no ischemia or infarction on perfusion images. Left heart cath (10/12): 30% mid LAD, 30% ostial D1, 50% ostial D2.     Cough 11/13/2013   DCIS (ductal carcinoma in situ) of breast    Depression    Headache 02/22/2014   Hx of cardiovascular stress test    Lexiscan Myoview (2/16): Breast attenuation artifact, no ischemia, EF 64%; low risk   Hyperlipemia  Hypertension    Iron deficiency    hx of   Leukocytoclastic vasculitis ([place])    Rash across lower body, occurred in 9/11, diagnosed by skin biopsy. ANA positive. Thought to be secondary to cocaine use.   Leukopenia 03/21/2013   Lymphadenopathy 03/21/2013   Lymphadenopathy 01/30/2014   Mental disorder    Pancytopenia ([place])    Pancytopenia, acquired ([place]) 07/16/2015   Polysubstance abuse ([place])    Prior cocaine   Pulmonary HTN ([place])    Echo (9/11) with EF 65%, mild LVH, mild AI, mild MR, moderate to possibly severe TR with PA systolic pressure 52 mmHg. Echo (10/12): severe LV  hypertrophy, EF 60-65%, mild MR, mild AI, moderate to severe tricuspid regurgitation, PA systolic pressure 38 mmHg.  Echo 4/14: moderate LVH, EF 65%, normal wall motion, diastolic dysfunction, mild AI, mild MR   Shortness of breath    a. PFTs 5/14:  FEV1/FVC 99% predicted; FVC 60% predicted; DLCO mildly reduced, mild restriction, air trapping.;  b.  seen by pulmo (Dr. [name]) 5/14: mainly upper airway symptoms - ACE d/c'd (sx's better)   Stroke [facility]) 2010ish   Syphilis 04/25/2013   Tobacco abuse    Tricuspid regurgitation    Unspecified deficiency anemia 03/29/2013    Past Surgical History:  Procedure Laterality Date   ANKLE SURGERY     right   BONE MARROW ASPIRATION     CARDIAC CATHETERIZATION     CARDIAC CATHETERIZATION N/A 04/23/2015   Procedure: Right Heart Cath;  Surgeon: [name], MD;  Location: [place] [facility];  Service: Cardiovascular;  Laterality: N/A;   CARDIOVERSION N/A 03/11/2018   Procedure: CARDIOVERSION;  Surgeon: [name], MD;  Location: [facility] ENDOSCOPY;  Service: Cardiovascular;  Laterality: N/A;   I&D EXTREMITY  08/09/2011   Procedure: [id number] AND DEBRIDEMENT EXTREMITY;  Surgeon: [name], MD;  Location: [place];  Service: General;  Laterality: Left;  i & D Left axilla abscess   LYMPH NODE BIOPSY N/A 04/05/2013   Procedure: EXCISIONAL BIOPSY RIGHT SUBMANDIBULAR NODE, NASAL ENDOSCOPIC WITH BIOPSY NASAL PHARYNX;  Surgeon: [name], MD;  Location: [place];  Service: ENT;  Laterality: N/A;   TEE WITHOUT CARDIOVERSION N/A 03/11/2018   Procedure: TRANSESOPHAGEAL ECHOCARDIOGRAM (TEE);  Surgeon: [name], MD;  Location: [facility] ENDOSCOPY;  Service: Cardiovascular;  Laterality: N/A;     Current Outpatient Medications  Medication Sig Dispense Refill   amiodarone (PACERONE) 200 MG tablet Take 1 tablet (200 mg total) by mouth daily. 90 tablet 2   atorvastatin (LIPITOR) 40 MG tablet Take 1 tablet (40 mg total) by mouth at bedtime. 30 tablet 2    buPROPion (WELLBUTRIN SR) 150 MG 12 hr tablet Take 1 tablet (150 mg total) by mouth 2 (two) times daily. 60 tablet 2   citalopram (CELEXA) 40 MG tablet Take 1 tablet (40 mg total) by mouth daily. 30 tablet 2   cloNIDine (CATAPRES) 0.1 MG tablet Take 1 tablet (0.1 mg total) by mouth 2 (two) times daily. 60 tablet 2   diltiazem (CARDIZEM CD) 180 MG 24 hr capsule Take 1 capsule (180 mg total) by mouth daily. 60 capsule 2   ferrous sulfate 325 (65 FE) MG EC tablet Take 1 tablet (325 mg total) by mouth 3 (three) times daily with meals. 60 tablet 3   furosemide (LASIX) 40 MG tablet Take 1 tablet (40 mg total) by mouth 2 (two) times daily. 90 tablet 3   loratadine (CLARITIN) 10 MG tablet Take 1  tablet (10 mg total) by mouth daily as needed for allergies. 90 tablet 1   rivaroxaban (XARELTO) 20 MG TABS tablet Take 1 tablet (20 mg total) by mouth daily with supper. 30 tablet 0   topiramate (TOPAMAX) 25 MG tablet Take 2 tablets (50 mg total) by mouth at bedtime. 60 tablet 2   traZODone (DESYREL) 150 MG tablet Take 1 tablet (150 mg total) by mouth at bedtime. 30 tablet 2   lisinopril (PRINIVIL,ZESTRIL) 10 MG tablet Take 1 tablet (10 mg total) by mouth daily. 90 tablet 3   nicotine (NICODERM CQ) 14 mg/24hr patch Place 1 patch (14 mg total) onto the skin daily. USE FOR 2 WEEKS AFTER YOU FINISH 21 MG PATCHES 14 patch 0   nicotine (NICODERM CQ) 21 mg/24hr patch Place 1 patch (21 mg total) onto the skin daily. USE FOR 6 WEEKS AND THEN START THE 14 MG 42 patch 0   nicotine (NICODERM CQ) 7 mg/24hr patch Place 1 patch (7 mg total) onto the skin daily. AFTER YOU FINISH 14 MG, USE FOR 2 WEEKS 14 patch 0   No current facility-administered medications for this visit.     Allergies:   Ciprofloxacin    Social History:  The patient  reports that she has been smoking cigarettes. She has a 60.00 pack-year smoking history. She has never used smokeless tobacco. She reports that she does not drink alcohol or use  drugs.   Family History:  The patient's family history includes Asthma in her grandson; Breast cancer in her maternal grandmother; Coronary artery disease in her father and mother; Diabetes in her father, maternal grandmother, and mother; Heart attack in her father and maternal grandmother; Hypertension in her father; Stroke in her maternal grandmother.    ROS:  Please see the history of present illness.   Otherwise, review of systems are positive for none.   All other systems are reviewed and negative.    PHYSICAL EXAM: VS:  BP 128/86    Pulse 76    Temp (!) 96.8 F (36 C)    Ht 5\' 6"  (1.676 m)    Wt 205 lb 9.6 oz (93.3 kg)    LMP 05/10/2013    SpO2 99%    BMI 33.18 kg/m  , BMI Body mass index is 33.18 kg/m. GENERAL:  Well appearing HEENT: Pupils equal round and reactive, fundi not visualized, oral mucosa unremarkable.  S/p extraction of several teeth NECK:  No jugular venous distention, waveform within normal limits, carotid upstroke brisk and symmetric, no bruits, no thyromegaly LYMPHATICS:  No cervical adenopathy LUNGS:  Clear to auscultation bilaterally HEART:  RRR.  PMI not displaced or sustained,S1 and S2 within normal limits, no S3, no S4, no clicks, no rubs, II/VI systolic murmur at apex ABD:  Flat, positive bowel sounds normal in frequency in pitch, no bruits, no rebound, no guarding, no midline pulsatile mass, no hepatomegaly, no splenomegaly EXT:  2 plus pulses throughout, no edema, no cyanosis no clubbing SKIN:  No rashes no nodules NEURO:  Cranial nerves II through XII grossly intact, motor grossly intact throughout PSYCH:  Cognitively intact, oriented to person place and time   EKG:  EKG is not ordered today. The ekg ordered 03/25/18 demonstrates atrial flutter.  Variable ventricular response.  Ventricular rate 78 bpm 05/23/18: Atrial flutter.  Variable A-V block.  Rate 82 bpm.  Echo 03/09/18: Study Conclusions  - Left ventricle: The cavity size was normal. Wall thickness  was   normal. Systolic function was  normal. The estimated ejection   fraction was in the range of 50% to 55%. The study is not   technically sufficient to allow evaluation of LV diastolic   function. - Aortic valve: There was mild regurgitation. - Mitral valve: There was moderate to severe regurgitation. - Left atrium: The atrium was severely dilated. - Atrial septum: No defect or patent foramen ovale was identified. - Tricuspid valve: There was moderate regurgitation. - Pulmonary arteries: PA peak pressure: 39 mm Hg (S).  TEE 03/11/18: Study Conclusions  - Left ventricle: LVEF is depressed. - Mitral valve: MV is mildly thickened. Leaflets do not appear to   coapt well Annular distance is 39 mm There is moderate to severe   MR directed toward back of LA> - Left atrium: No evidence of thrombus in the atrial cavity or   appendage.  Impressions:  - Successful cardioversion. No cardiac source of emboli was   indentified.  Recent Labs: 03/09/2018: Magnesium 2.2; TSH 2.563 04/25/2018: B Natriuretic Peptide 133.9 12/16/2018: ALT 52 12/22/2018: BUN 20; Creatinine, Ser 1.03; Potassium 4.2; Sodium 137 12/27/2018: Hemoglobin 9.7; Platelets 288    Lipid Panel    Component Value Date/Time   CHOL 154 08/21/2015 1212   TRIG 109 08/21/2015 1212   HDL 43 (L) 08/21/2015 1212   CHOLHDL 3.6 08/21/2015 1212   VLDL 22 08/21/2015 1212   LDLCALC 89 08/21/2015 1212   LDLDIRECT 142.1 12/25/2010 0846      Wt Readings from Last 3 Encounters:  01/06/19 205 lb 9.6 oz (93.3 kg)  12/27/18 203 lb 14.4 oz (92.5 kg)  12/17/18 194 lb 14.2 oz (88.4 kg)      ASSESSMENT AND PLAN:  # Persistent atrial flutter:  Morgan Bean previously underwent ablation and had a DCCV 02/2018.  Today she is back in sinus rhythm and doing well.  She is tolerating amiodarone.  Continue diltiazem, amiodarone, and Xarelto.    # Moderate to severe MR:  She is euvolemic.  Continue furosemide.  Initially she did not undergo  surgery due to drug use.  She is doing well at this time.  We will repeat her echocardiogram to evaluate the severity of her mitral regurgitation.  If she has severe mitral vegetation and her history of atrial fibrillation would make her a candidate for replacement/repair.  She has been abstaining from illicit drugs.  Consider referral to CT surgery based on the echo results.   # Hypertension:  Her blood pressure was initially elevated and better on repeat.  Continue clonidine, diltiazem, and lisinopril.  # Chronic diastolic heart failure: She is euvolemic.  Continue BP control and lasix as above.  # CAD: Non-obstructive on cath in 2012.  No ischemia on stress 11/2015.  # Hyperlipidemia:  Continue atorvastatin.  # Prior stroke: Aspirin discontinued when she started Xarelto.  # Polysubstance abuse: Congratulated her on abstaining from cocaine.  She is interested in smoking cessation.  She would like to try nicotine patches.  She will start with 21 mg daily for 6 weeks followed by 14 mg for 2 weeks and 7 mg for 2 weeks.  Current medicines are reviewed at length with the patient today.  The patient does not have concerns regarding medicines.  The following changes have been made: Start nicotine patches  Labs/ tests ordered today include:   Orders Placed This Encounter  Procedures   ECHOCARDIOGRAM COMPLETE     Disposition:   FU with Isaack Preble C. Oval Linsey, MD, [facility] in 4 months.  Signed, Gaudencio Chesnut C. Oval Linsey, MD, [facility]  01/06/2019 12:51 PM    [place]

## 2019-01-25 ENCOUNTER — Ambulatory Visit (HOSPITAL_COMMUNITY): Payer: Medicaid Other | Attending: Cardiovascular Disease

## 2019-01-25 ENCOUNTER — Other Ambulatory Visit: Payer: Self-pay

## 2019-01-25 DIAGNOSIS — I340 Nonrheumatic mitral (valve) insufficiency: Secondary | ICD-10-CM | POA: Insufficient documentation

## 2019-02-24 ENCOUNTER — Ambulatory Visit (INDEPENDENT_AMBULATORY_CARE_PROVIDER_SITE_OTHER): Payer: Medicaid Other | Admitting: Podiatry

## 2019-02-24 DIAGNOSIS — Z5329 Procedure and treatment not carried out because of patient's decision for other reasons: Secondary | ICD-10-CM

## 2019-02-24 NOTE — Progress Notes (Signed)
No show for appt. 

## 2019-02-28 NOTE — Pre-Procedure Instructions (Signed)
[facility] [place] [address] Phone: [phone] Fax: [place], [place] [address] [place] [address] Phone: [phone] Fax: [phone]      Your procedure is scheduled on Friday December 11th.  Report to [facility] Main Entrance "A" at 7:00 A.M., and check in at the Admitting office.  Call this number if you have problems the morning of surgery:  [phone]  Call [phone] if you have any questions prior to your surgery date Monday-Friday 8am-4pm    Remember:  Do not eat or drink after midnight the night before your surgery     Take these medicines the morning of surgery with A SIP OF WATER  amiodarone (PACERONE)  ARIPiprazole (ABILIFY)  buPROPion (WELLBUTRIN SR)  citalopram (CELEXA) cloNIDine (CATAPRES) diltiazem (CARDIZEM CD) hydrOXYzine (ATARAX/VISTARIL)  Follow your surgeon's instructions on when to stop rivaroxaban (XARELTO).  If no instructions were given by your surgeon then you will need to call the office to get those instructions.     As of today, STOP taking any Aspirin (unless otherwise instructed by your surgeon), Aleve, Naproxen, Ibuprofen, Motrin, Advil, Goody's, BC's, all herbal medications, fish oil, and all vitamins.    The Morning of Surgery  Do not wear jewelry, make-up or nail polish.  Do not wear lotions, powders, or perfumes/colognes, or deodorant  Do not shave 48 hours prior to surgery.  Men may shave face and neck.  Do not bring valuables to the hospital.  [facility] is not responsible for any belongings or valuables.  If you are a smoker, DO NOT Smoke 24 hours prior to surgery  If you wear a CPAP at night please bring your mask, tubing, and machine the morning of surgery   Remember that you must have someone to transport you home after your surgery, and remain with you for 24 hours if you are  discharged the same day.   Please bring cases for contacts, glasses, hearing aids, dentures or bridgework because it cannot be worn into surgery.    Leave your suitcase in the car.  After surgery it may be brought to your room.  For patients admitted to the hospital, discharge time will be determined by your treatment team.  Patients discharged the day of surgery will not be allowed to drive home.    Special instructions:   [place]- Preparing For Surgery  Before surgery, you can play an important role. Because skin is not sterile, your skin needs to be as free of germs as possible. You can reduce the number of germs on your skin by washing with CHG (chlorahexidine gluconate) Soap before surgery.  CHG is an antiseptic cleaner which kills germs and bonds with the skin to continue killing germs even after washing.    Oral Hygiene is also important to reduce your risk of infection.  Remember - BRUSH YOUR TEETH THE MORNING OF SURGERY WITH YOUR REGULAR TOOTHPASTE  Please do not use if you have an allergy to CHG or antibacterial soaps. If your skin becomes reddened/irritated stop using the CHG.  Do not shave (including legs and underarms) for at least 48 hours prior to first CHG shower. It is OK to shave your face.  Please follow these instructions carefully.   1. Shower the NIGHT BEFORE SURGERY and the MORNING OF SURGERY with CHG Soap.   2. If you chose to wash your hair, wash your hair first  as usual with your normal shampoo.  3. After you shampoo, rinse your hair and body thoroughly to remove the shampoo.  4. Use CHG as you would any other liquid soap. You can apply CHG directly to the skin and wash gently with a scrungie or a clean washcloth.   5. Apply the CHG Soap to your body ONLY FROM THE NECK DOWN.  Do not use on open wounds or open sores. Avoid contact with your eyes, ears, mouth and genitals (private parts). Wash Face and genitals (private parts)  with your normal soap.    6. Wash thoroughly, paying special attention to the area where your surgery will be performed.  7. Thoroughly rinse your body with warm water from the neck down.  8. DO NOT shower/wash with your normal soap after using and rinsing off the CHG Soap.  9. Pat yourself dry with a CLEAN TOWEL.  10. Wear CLEAN PAJAMAS to bed the night before surgery, wear comfortable clothes the morning of surgery  11. Place CLEAN SHEETS on your bed the night of your first shower and DO NOT SLEEP WITH PETS.    Day of Surgery:  Please shower the morning of surgery with the CHG soap Do not apply any deodorants/lotions. Please wear clean clothes to the hospital/surgery center.   Remember to brush your teeth WITH YOUR REGULAR TOOTHPASTE.   Please read over the following fact sheets that you were given.

## 2019-03-01 ENCOUNTER — Encounter (HOSPITAL_COMMUNITY): Payer: Self-pay

## 2019-03-01 ENCOUNTER — Other Ambulatory Visit: Payer: Self-pay

## 2019-03-01 ENCOUNTER — Encounter (HOSPITAL_COMMUNITY)
Admission: RE | Admit: 2019-03-01 | Discharge: 2019-03-01 | Disposition: A | Discharge status: Home / Self Care | Payer: Medicaid Other | Source: Ambulatory Visit | Attending: Oral Surgery | Admitting: Oral Surgery

## 2019-03-01 ENCOUNTER — Other Ambulatory Visit (HOSPITAL_COMMUNITY)
Admission: RE | Admit: 2019-03-01 | Discharge: 2019-03-01 | Disposition: A | Discharge status: Home / Self Care | Payer: Medicaid Other | Source: Ambulatory Visit | Attending: Oral Surgery | Admitting: Oral Surgery

## 2019-03-01 DIAGNOSIS — Z8673 Personal history of transient ischemic attack (TIA), and cerebral infarction without residual deficits: Secondary | ICD-10-CM | POA: Diagnosis not present

## 2019-03-01 DIAGNOSIS — Z20828 Contact with and (suspected) exposure to other viral communicable diseases: Secondary | ICD-10-CM | POA: Diagnosis not present

## 2019-03-01 DIAGNOSIS — G4733 Obstructive sleep apnea (adult) (pediatric): Secondary | ICD-10-CM | POA: Insufficient documentation

## 2019-03-01 DIAGNOSIS — I272 Pulmonary hypertension, unspecified: Secondary | ICD-10-CM | POA: Insufficient documentation

## 2019-03-01 DIAGNOSIS — I2510 Atherosclerotic heart disease of native coronary artery without angina pectoris: Secondary | ICD-10-CM | POA: Insufficient documentation

## 2019-03-01 DIAGNOSIS — Z01812 Encounter for preprocedural laboratory examination: Secondary | ICD-10-CM | POA: Insufficient documentation

## 2019-03-01 DIAGNOSIS — I083 Combined rheumatic disorders of mitral, aortic and tricuspid valves: Secondary | ICD-10-CM | POA: Insufficient documentation

## 2019-03-01 LAB — COMPREHENSIVE METABOLIC PANEL
ALT: 13 U/L (ref 0–44)
AST: 14 U/L — ABNORMAL LOW (ref 15–41)
Albumin: 3.1 g/dL — ABNORMAL LOW (ref 3.5–5.0)
Alkaline Phosphatase: 61 U/L (ref 38–126)
Anion gap: 9 (ref 5–15)
BUN: 10 mg/dL (ref 6–20)
CO2: 24 mmol/L (ref 22–32)
Calcium: 9 mg/dL (ref 8.9–10.3)
Chloride: 108 mmol/L (ref 98–111)
Creatinine, Ser: 1.25 mg/dL — ABNORMAL HIGH (ref 0.44–1.00)
GFR calc Af Amer: 56 mL/min — ABNORMAL LOW (ref >60)
GFR calc non Af Amer: 48 mL/min — ABNORMAL LOW (ref >60)
Glucose, Bld: 93 mg/dL (ref 70–99)
Potassium: 4.1 mmol/L (ref 3.5–5.1)
Sodium: 141 mmol/L (ref 135–145)
Total Bilirubin: <0.1 mg/dL — ABNORMAL LOW (ref 0.3–1.2)
Total Protein: 7.8 g/dL (ref 6.5–8.1)

## 2019-03-01 LAB — CBC
HCT: 37.1 % (ref 36.0–46.0)
Hemoglobin: 11.3 g/dL — ABNORMAL LOW (ref 12.0–15.0)
MCH: 24.9 pg — ABNORMAL LOW (ref 26.0–34.0)
MCHC: 30.5 g/dL (ref 30.0–36.0)
MCV: 81.9 fL (ref 80.0–100.0)
Platelets: 265 10*3/uL (ref 150–400)
RBC: 4.53 MIL/uL (ref 3.87–5.11)
RDW: 15.4 % (ref 11.5–15.5)
WBC: 4.3 10*3/uL (ref 4.0–10.5)
nRBC: 0 % (ref 0.0–0.2)

## 2019-03-01 LAB — SARS CORONAVIRUS 2 (TAT 6-24 HRS): SARS Coronavirus 2: NEGATIVE

## 2019-03-01 NOTE — Anesthesia Preprocedure Evaluation (Addendum)
Anesthesia Evaluation  Patient identified by MRN, date of birth, ID band Patient awake    Reviewed: Allergy & Precautions, H&P , NPO status , Patient's Chart, lab work & pertinent test results, reviewed documented beta blocker date and time   Airway Mallampati: II  TM Distance: >3 FB Neck ROM: full    Dental no notable dental hx. (+) Loose, Missing, Chipped, Poor Dentition, Dental Advisory Given,    Pulmonary shortness of breath, asthma , COPD, Current Smoker,    Pulmonary exam normal breath sounds clear to auscultation       Cardiovascular Exercise Tolerance: Good hypertension, + angina + CAD, +CHF and + DOE  + Valvular Problems/Murmurs MR and AI  Rhythm:regular Rate:Normal     Neuro/Psych  Headaches, PSYCHIATRIC DISORDERS Anxiety Depression TIACVA    GI/Hepatic negative GI ROS, (+)     substance abuse  cocaine use,   Endo/Other  negative endocrine ROS  Renal/GU Renal disease  negative genitourinary   Musculoskeletal  (+) Arthritis , Rheumatoid disorders,    Abdominal   Peds  Hematology  (+) Blood dyscrasia, anemia ,   Anesthesia Other Findings   Reproductive/Obstetrics negative OB ROS                          Anesthesia Physical Anesthesia Plan  ASA: IV  Anesthesia Plan: General   Post-op Pain Management:    Induction:   PONV Risk Score and Plan: 3 and Ondansetron, Treatment may vary due to age or medical condition, Dexamethasone and Midazolam  Airway Management Planned: Oral ETT and Video Laryngoscope Planned  Additional Equipment:   Intra-op Plan:   Post-operative Plan:   Informed Consent: I have reviewed the patients History and Physical, chart, labs and discussed the procedure including the risks, benefits and alternatives for the proposed anesthesia with the patient or authorized representative who has indicated his/her understanding and acceptance.     Dental  Advisory Given  Plan Discussed with: CRNA and Anesthesiologist  Anesthesia Plan Comments: (Follows with cardiology for hx of HFpEF, mild pulmonary hypertension, prior CVA, CAD, atrial flutter s/p ablation, OSA on CPAP. Previously cleared for surgery per telephone encounter 11/04/18 "Chart reviewed and patient contacted today by phone as part of pre-operative protocol coverage. Given past medical history and time since last visit, based on ACC/AHA guidelines, Morgan Bean would be at acceptable risk for the planned procedure without further cardiovascular testing. Based on [facility] guidelines there is no indication for SBE prophylaxis. Ok to hold Eliquis one day pre op."   After this clearance, Pt was admitted 9/25-10/1/20 for neutropenic fever, supraglottitis/epiglottitis, anemia, afib. She has a history of cyclic episodes of neutropenia. She followed up with heme/onc post discharge. Per Dr. Calton Dach note 12/27/18, "The patient had extensive work-up recently in the hospital. She was found to have positive ANA screen along with rheumatoid factor screen. She was started on prednisone therapy with significant improvement of her blood count. Even though she is still mildly neutropenic, with the improvement seen, I expect her blood count to continue to improve. Her anemia is multifactorial, likely anemia of chronic disease along with component of iron deficiency. I recommend oral iron supplement.Marland KitchenMarland KitchenShe has poor dentition due to chronic smoking. She expressed desire to have all her teeth extracted. With improvement of her blood count, she should be able to proceed with extraction in the near future."  She was last seen by Dr. Oval Linsey 01/06/19. Per note "She was admitted 11/2018 for epiglotitis  and neutropenic fever.  Since being discharged she has been feeling better.  Her breathing has been stable.  She has some lower extremity edema at the end of the day that improves with elevation of her legs.  She has no orthopnea  or PND.  She did have some atrial fibrillation in the hospital.  Diltiazem was transiently held due to hypotension.  Once this was restarted her rate was well-controlled.  She has not experienced any lately.  She denies any chest pain or pressure.  Overall she is doing well.  She continues to smoke 1 pack of cigarettes daily and is interested in quitting.  She continue to abstain from illicits." It was also noted that previous echo showed moderate to severe MR. Updated echo 01/25/19 showed only mild MR, LVEF 60-65%, mild-mod aortic regurgitation. Dr. Oval Linsey commented "Echo looks very good. Her heart is squeezing well. The mitral valve is only leaking mildly. Her heart seems to be doing much better now that she is staying in rhythm. No changes."  Regarding her epiglottitis/surpaglottitis, she followed up with ENT after discharge. Per Dr. Noreene Filbert last note [id number] "Improving supraglottic swelling. Presumptive levamisole vasculitis. Plan: I spent some time discussing with her and her daughter about levamisole and is practically ubiquitous status in street sale cocaine. It is imperative that she no longer use cocaine in any form. I think she is probably okay to go ahead with her dental surgery. I will see her as needed."  Reviewed case with Dr. Fransisco Beau, ok to proceed but not appropriate for [facility] due to recent epiglottitis.   Preop labs reviewed. Mild anemia with Hgb 11.3. Otherwise unremarkable.   EKG 12/18/18: Atrial fibrillation. Vent rate 68. Nonspecific ST and T wave abnormality  TTE 01/25/19:  1. Left ventricular ejection fraction, by visual estimation, is 60 to 65%. The left ventricle has normal function. There is no left ventricular hypertrophy.  2. Global right ventricle has normal systolic function.The right ventricular size is normal. No increase in right ventricular wall thickness.  3. Left atrial size was moderately dilated.  4. Right atrial size was normal.  5. The mitral valve is normal  in structure. Mild mitral valve regurgitation. No evidence of mitral stenosis.  6. The tricuspid valve is normal in structure. Tricuspid valve regurgitation is mild.  7. Aortic valve regurgitation is mild to moderate.  8. The aortic valve is tricuspid. Aortic valve regurgitation is mild to moderate. Mild aortic valve sclerosis without stenosis.  9. The pulmonic valve was grossly normal. Pulmonic valve regurgitation is mild. 10. Mildly elevated pulmonary artery systolic pressure. 11. The inferior vena cava is normal in size with greater than 50% respiratory variability, suggesting right atrial pressure of 3 mmHg. )    Anesthesia Quick Evaluation

## 2019-03-01 NOTE — Progress Notes (Addendum)
Anesthesia Chart Review:  Follows with cardiology for hx of HFpEF, mild pulmonary hypertension, prior CVA, CAD, atrial flutter s/p ablation, OSA on CPAP. Previously cleared for surgery per telephone encounter 11/04/18 "Chart reviewed and patient contacted today by phone as part of pre-operative protocol coverage. Given past medical history and time since last visit, based on ACC/AHA guidelines, Morgan Bean would be at acceptable risk for the planned procedure without further cardiovascular testing. Based on [facility] guidelines there is no indication for SBE prophylaxis. Ok to hold Eliquis one day pre op."   After this clearance, Pt was admitted 9/25-10/1/20 for neutropenic fever, supraglottitis/epiglottitis, anemia, afib. She has a history of cyclic episodes of neutropenia. She followed up with heme/onc post discharge. Per Dr. Calton Dach note 12/27/18, "The patient had extensive work-up recently in the hospital. She was found to have positive ANA screen along with rheumatoid factor screen. She was started on prednisone therapy with significant improvement of her blood count. Even though she is still mildly neutropenic, with the improvement seen, I expect her blood count to continue to improve. Her anemia is multifactorial, likely anemia of chronic disease along with component of iron deficiency. I recommend oral iron supplement.Morgan KitchenMarland KitchenShe has poor dentition due to chronic smoking. She expressed desire to have all her teeth extracted. With improvement of her blood count, she should be able to proceed with extraction in the near future."  She was last seen by Dr. Oval Linsey 01/06/19. Per note "She was admitted 11/2018 for epiglotitis and neutropenic fever.  Since being discharged she has been feeling better.  Her breathing has been stable.  She has some lower extremity edema at the end of the day that improves with elevation of her legs.  She has no orthopnea or PND.  She did have some atrial fibrillation in the hospital.   Diltiazem was transiently held due to hypotension.  Once this was restarted her rate was well-controlled.  She has not experienced any lately.  She denies any chest pain or pressure.  Overall she is doing well.  She continues to smoke 1 pack of cigarettes daily and is interested in quitting.  She continue to abstain from illicits." It was also noted that previous echo showed moderate to severe MR. Updated echo 01/25/19 showed only mild MR, LVEF 60-65%, mild-mod aortic regurgitation. Dr. Oval Linsey commented "Echo looks very good. Her heart is squeezing well. The mitral valve is only leaking mildly. Her heart seems to be doing much better now that she is staying in rhythm. No changes."  Regarding her epiglottitis/surpaglottitis, she followed up with ENT after discharge. Per Dr. Noreene Bean last note [id number] "Improving supraglottic swelling. Presumptive levamisole vasculitis. Plan: I spent some time discussing with her and her daughter about levamisole and is practically ubiquitous status in street sale cocaine. It is imperative that she no longer use cocaine in any form. I think she is probably okay to go ahead with her dental surgery. I will see her as needed."  Reviewed case with Dr. Fransisco Beau, ok to proceed but not appropriate for [facility] due to recent epiglottitis.   Preop labs reviewed. Mild anemia with Hgb 11.3. Otherwise unremarkable.   EKG 12/18/18: Atrial fibrillation. Vent rate 68. Nonspecific ST and T wave abnormality  TTE 01/25/19:  1. Left ventricular ejection fraction, by visual estimation, is 60 to 65%. The left ventricle has normal function. There is no left ventricular hypertrophy.  2. Global right ventricle has normal systolic function.The right ventricular size is normal. No increase in right ventricular wall  thickness.  3. Left atrial size was moderately dilated.  4. Right atrial size was normal.  5. The mitral valve is normal in structure. Mild mitral valve regurgitation. No evidence of  mitral stenosis.  6. The tricuspid valve is normal in structure. Tricuspid valve regurgitation is mild.  7. Aortic valve regurgitation is mild to moderate.  8. The aortic valve is tricuspid. Aortic valve regurgitation is mild to moderate. Mild aortic valve sclerosis without stenosis.  9. The pulmonic valve was grossly normal. Pulmonic valve regurgitation is mild. 10. Mildly elevated pulmonary artery systolic pressure. 11. The inferior vena cava is normal in size with greater than 50% respiratory variability, suggesting right atrial pressure of 3 mmHg.   Morgan Bean [facility] Short Stay Center/Anesthesiology Phone [phone] 03/01/2019 12:38 PM

## 2019-03-01 NOTE — Progress Notes (Signed)
PCP - [name] Cardiologist - Tiffany Sekiu  Chest x-ray - 12/16/18 EKG - 12/18/18 Stress Test - 05/10/14 ECHO - 01/25/19 Cardiac Cath - 04/23/15  Sleep Study - 2018 CPAP - at night, patient understands to bring mask and tubing the day of surgery  Blood Thinner Instructions: Patient stated that her LD will be today 03/01/19 Aspirin Instructions:n/a   COVID TEST- 03/01/19 - instructions given on address and the importance of staying at home   Anesthesia review: yes, cardiac hx  Patient denies shortness of breath, fever, cough and chest pain at PAT appointment   All instructions explained to the patient, with a verbal understanding of the material. Patient agrees to go over the instructions while at home for a better understanding. Patient also instructed to self quarantine after being tested for COVID-19. The opportunity to ask questions was provided.

## 2019-03-03 ENCOUNTER — Ambulatory Visit (HOSPITAL_COMMUNITY): Payer: Medicaid Other | Admitting: Anesthesiology

## 2019-03-03 ENCOUNTER — Other Ambulatory Visit: Payer: Self-pay

## 2019-03-03 ENCOUNTER — Telehealth: Payer: Self-pay

## 2019-03-03 ENCOUNTER — Encounter (HOSPITAL_COMMUNITY)
Admission: RE | Disposition: A | Discharge status: Home / Self Care | Payer: Self-pay | Source: Home / Self Care | Attending: Oral Surgery

## 2019-03-03 ENCOUNTER — Ambulatory Visit (HOSPITAL_COMMUNITY): Payer: Medicaid Other | Admitting: Physician Assistant

## 2019-03-03 ENCOUNTER — Encounter (HOSPITAL_COMMUNITY): Payer: Self-pay | Admitting: Oral Surgery

## 2019-03-03 ENCOUNTER — Ambulatory Visit (HOSPITAL_COMMUNITY)
Admission: RE | Admit: 2019-03-03 | Discharge: 2019-03-03 | Disposition: A | Discharge status: Home / Self Care | Payer: Medicaid Other | Attending: Oral Surgery | Admitting: Oral Surgery

## 2019-03-03 DIAGNOSIS — I110 Hypertensive heart disease with heart failure: Secondary | ICD-10-CM | POA: Insufficient documentation

## 2019-03-03 DIAGNOSIS — I2510 Atherosclerotic heart disease of native coronary artery without angina pectoris: Secondary | ICD-10-CM | POA: Diagnosis not present

## 2019-03-03 DIAGNOSIS — Z8673 Personal history of transient ischemic attack (TIA), and cerebral infarction without residual deficits: Secondary | ICD-10-CM | POA: Diagnosis not present

## 2019-03-03 DIAGNOSIS — M899 Disorder of bone, unspecified: Secondary | ICD-10-CM | POA: Insufficient documentation

## 2019-03-03 DIAGNOSIS — M069 Rheumatoid arthritis, unspecified: Secondary | ICD-10-CM | POA: Diagnosis not present

## 2019-03-03 DIAGNOSIS — I5032 Chronic diastolic (congestive) heart failure: Secondary | ICD-10-CM | POA: Insufficient documentation

## 2019-03-03 DIAGNOSIS — M270 Developmental disorders of jaws: Secondary | ICD-10-CM | POA: Insufficient documentation

## 2019-03-03 DIAGNOSIS — K029 Dental caries, unspecified: Secondary | ICD-10-CM | POA: Diagnosis present

## 2019-03-03 DIAGNOSIS — J449 Chronic obstructive pulmonary disease, unspecified: Secondary | ICD-10-CM | POA: Diagnosis not present

## 2019-03-03 DIAGNOSIS — K0530 Chronic periodontitis, unspecified: Secondary | ICD-10-CM | POA: Insufficient documentation

## 2019-03-03 DIAGNOSIS — Z881 Allergy status to other antibiotic agents status: Secondary | ICD-10-CM | POA: Diagnosis not present

## 2019-03-03 DIAGNOSIS — F17200 Nicotine dependence, unspecified, uncomplicated: Secondary | ICD-10-CM | POA: Diagnosis not present

## 2019-03-03 DIAGNOSIS — I272 Pulmonary hypertension, unspecified: Secondary | ICD-10-CM | POA: Diagnosis not present

## 2019-03-03 HISTORY — PX: MULTIPLE EXTRACTIONS WITH ALVEOLOPLASTY: SHX5342

## 2019-03-03 LAB — PROTIME-INR
INR: 1.1 (ref 0.8–1.2)
Prothrombin Time: 13.7 seconds (ref 11.4–15.2)

## 2019-03-03 SURGERY — MULTIPLE EXTRACTION WITH ALVEOLOPLASTY
Anesthesia: General

## 2019-03-03 MED ORDER — FENTANYL CITRATE (PF) 250 MCG/5ML IJ SOLN
INTRAMUSCULAR | Status: AC
  Filled 2019-03-03: qty 5

## 2019-03-03 MED ORDER — CEFAZOLIN SODIUM-DEXTROSE 2-4 GM/100ML-% IV SOLN
2.0000 g | INTRAVENOUS | Status: AC
  Administered 2019-03-03: 2 g via INTRAVENOUS
  Filled 2019-03-03: qty 100

## 2019-03-03 MED ORDER — DEXAMETHASONE SODIUM PHOSPHATE 10 MG/ML IJ SOLN
INTRAMUSCULAR | Status: AC
  Filled 2019-03-03: qty 1

## 2019-03-03 MED ORDER — LIDOCAINE-EPINEPHRINE 2 %-1:100000 IJ SOLN
INTRAMUSCULAR | Status: AC
  Filled 2019-03-03: qty 1

## 2019-03-03 MED ORDER — LIDOCAINE 2% (20 MG/ML) 5 ML SYRINGE
INTRAMUSCULAR | Status: DC | PRN
  Administered 2019-03-03: 70 mg via INTRAVENOUS

## 2019-03-03 MED ORDER — HYDRALAZINE HCL 20 MG/ML IJ SOLN
10.0000 mg | Freq: Once | INTRAMUSCULAR | Status: AC
  Filled 2019-03-03: qty 0.5

## 2019-03-03 MED ORDER — SODIUM CHLORIDE 0.9 % IV SOLN
INTRAVENOUS | Status: AC | PRN
  Administered 2019-03-03: 1000 mL

## 2019-03-03 MED ORDER — HYDRALAZINE HCL 20 MG/ML IJ SOLN
INTRAMUSCULAR | Status: AC
  Filled 2019-03-03: qty 1

## 2019-03-03 MED ORDER — OXYCODONE HCL 5 MG/5ML PO SOLN: 5.0000 mg | Freq: Once | ORAL | Status: DC | PRN

## 2019-03-03 MED ORDER — MIDAZOLAM HCL 2 MG/2ML IJ SOLN
INTRAMUSCULAR | Status: AC
  Filled 2019-03-03: qty 2

## 2019-03-03 MED ORDER — OXYCODONE HCL 5 MG PO TABS: 5.0000 mg | ORAL_TABLET | Freq: Once | ORAL | Status: DC | PRN

## 2019-03-03 MED ORDER — MEPERIDINE HCL 25 MG/ML IJ SOLN: 6.2500 mg | INTRAMUSCULAR | Status: DC | PRN

## 2019-03-03 MED ORDER — MIDAZOLAM HCL 5 MG/5ML IJ SOLN
INTRAMUSCULAR | Status: DC | PRN
  Administered 2019-03-03: 2 mg via INTRAVENOUS

## 2019-03-03 MED ORDER — PHENYLEPHRINE 40 MCG/ML (10ML) SYRINGE FOR IV PUSH (FOR BLOOD PRESSURE SUPPORT)
PREFILLED_SYRINGE | INTRAVENOUS | Status: AC
  Filled 2019-03-03: qty 10

## 2019-03-03 MED ORDER — HYDRALAZINE HCL 20 MG/ML IJ SOLN
INTRAMUSCULAR | Status: AC
  Administered 2019-03-03: 10 mg via INTRAVENOUS
  Filled 2019-03-03: qty 1

## 2019-03-03 MED ORDER — DEXAMETHASONE SODIUM PHOSPHATE 10 MG/ML IJ SOLN
INTRAMUSCULAR | Status: DC | PRN
  Administered 2019-03-03: 10 mg via INTRAVENOUS

## 2019-03-03 MED ORDER — ONDANSETRON HCL 4 MG/2ML IJ SOLN
INTRAMUSCULAR | Status: AC
  Filled 2019-03-03: qty 2

## 2019-03-03 MED ORDER — SUGAMMADEX SODIUM 200 MG/2ML IV SOLN
INTRAVENOUS | Status: DC | PRN
  Administered 2019-03-03: 200 mg via INTRAVENOUS

## 2019-03-03 MED ORDER — LACTATED RINGERS IV SOLN
INTRAVENOUS | Status: DC
  Administered 2019-03-03: 08:00:00 via INTRAVENOUS

## 2019-03-03 MED ORDER — ROCURONIUM BROMIDE 10 MG/ML (PF) SYRINGE
PREFILLED_SYRINGE | INTRAVENOUS | Status: AC
  Filled 2019-03-03: qty 10

## 2019-03-03 MED ORDER — LIDOCAINE-EPINEPHRINE 2 %-1:100000 IJ SOLN
INTRAMUSCULAR | Status: DC | PRN
  Administered 2019-03-03: 15 mL via INTRADERMAL

## 2019-03-03 MED ORDER — ROCURONIUM BROMIDE 10 MG/ML (PF) SYRINGE
PREFILLED_SYRINGE | INTRAVENOUS | Status: DC | PRN
  Administered 2019-03-03: 40 mg via INTRAVENOUS

## 2019-03-03 MED ORDER — HYDRALAZINE HCL 20 MG/ML IJ SOLN
10.0000 mg | Freq: Once | INTRAMUSCULAR | Status: AC
  Administered 2019-03-03: 10 mg via INTRAVENOUS

## 2019-03-03 MED ORDER — AMOXICILLIN 500 MG PO CAPS: 500.0000 mg | ORAL_CAPSULE | Freq: Three times a day (TID) | ORAL | 0 refills | Status: DC

## 2019-03-03 MED ORDER — ACETAMINOPHEN 160 MG/5ML PO SOLN: 325.0000 mg | ORAL | Status: DC | PRN

## 2019-03-03 MED ORDER — PHENYLEPHRINE 40 MCG/ML (10ML) SYRINGE FOR IV PUSH (FOR BLOOD PRESSURE SUPPORT)
PREFILLED_SYRINGE | INTRAVENOUS | Status: DC | PRN
  Administered 2019-03-03: 120 ug via INTRAVENOUS
  Administered 2019-03-03: 80 ug via INTRAVENOUS
  Administered 2019-03-03 (×2): 120 ug via INTRAVENOUS

## 2019-03-03 MED ORDER — FENTANYL CITRATE (PF) 100 MCG/2ML IJ SOLN
INTRAMUSCULAR | Status: AC
  Filled 2019-03-03: qty 2

## 2019-03-03 MED ORDER — ONDANSETRON HCL 4 MG/2ML IJ SOLN
INTRAMUSCULAR | Status: DC | PRN
  Administered 2019-03-03: 4 mg via INTRAVENOUS

## 2019-03-03 MED ORDER — 0.9 % SODIUM CHLORIDE (POUR BTL) OPTIME
TOPICAL | Status: DC | PRN
  Administered 2019-03-03: 1000 mL

## 2019-03-03 MED ORDER — PROPOFOL 10 MG/ML IV BOLUS
INTRAVENOUS | Status: AC
  Filled 2019-03-03: qty 20

## 2019-03-03 MED ORDER — FENTANYL CITRATE (PF) 100 MCG/2ML IJ SOLN
INTRAMUSCULAR | Status: DC | PRN
  Administered 2019-03-03: 50 ug via INTRAVENOUS

## 2019-03-03 MED ORDER — OXYCODONE-ACETAMINOPHEN 5-325 MG PO TABS: 1.0000 | ORAL_TABLET | ORAL | 0 refills | Status: DC | PRN

## 2019-03-03 MED ORDER — ACETAMINOPHEN 325 MG PO TABS: 325.0000 mg | ORAL_TABLET | ORAL | Status: DC | PRN

## 2019-03-03 MED ORDER — ONDANSETRON HCL 4 MG/2ML IJ SOLN: 4.0000 mg | Freq: Once | INTRAMUSCULAR | Status: DC | PRN

## 2019-03-03 MED ORDER — LIDOCAINE 2% (20 MG/ML) 5 ML SYRINGE
INTRAMUSCULAR | Status: AC
  Filled 2019-03-03: qty 5

## 2019-03-03 MED ORDER — FENTANYL CITRATE (PF) 100 MCG/2ML IJ SOLN
25.0000 ug | INTRAMUSCULAR | Status: DC | PRN
  Administered 2019-03-03 (×2): 50 ug via INTRAVENOUS

## 2019-03-03 MED ORDER — PROPOFOL 10 MG/ML IV BOLUS
INTRAVENOUS | Status: DC | PRN
  Administered 2019-03-03: 150 mg via INTRAVENOUS
  Administered 2019-03-03: 50 mg via INTRAVENOUS
  Administered 2019-03-03: 30 mg via INTRAVENOUS

## 2019-03-03 SURGICAL SUPPLY — 40 items
BUR CROSS CUT FISSURE 1.6 (BURR) ×2 IMPLANT
BUR CROSS CUT FISSURE 1.6MM (BURR) ×1
BUR EGG ELITE 4.0 (BURR) ×2 IMPLANT
BUR EGG ELITE 4.0MM (BURR) ×1
CANISTER SUCT 3000ML PPV (MISCELLANEOUS) ×3 IMPLANT
COVER SURGICAL LIGHT HANDLE (MISCELLANEOUS) ×3 IMPLANT
COVER WAND RF STERILE (DRAPES) ×3 IMPLANT
DECANTER SPIKE VIAL GLASS SM (MISCELLANEOUS) IMPLANT
DRAPE U-SHAPE 76X120 STRL (DRAPES) ×3 IMPLANT
GAUZE PACKING FOLDED 2  STR (GAUZE/BANDAGES/DRESSINGS) ×2
GAUZE PACKING FOLDED 2 STR (GAUZE/BANDAGES/DRESSINGS) ×1 IMPLANT
GLOVE BIO SURGEON STRL SZ 6.5 (GLOVE) IMPLANT
GLOVE BIO SURGEON STRL SZ7.5 (GLOVE) ×3 IMPLANT
GLOVE BIO SURGEONS STRL SZ 6.5 (GLOVE)
GLOVE BIOGEL PI IND STRL 6.5 (GLOVE) IMPLANT
GLOVE BIOGEL PI IND STRL 7.0 (GLOVE) IMPLANT
GLOVE BIOGEL PI INDICATOR 6.5 (GLOVE)
GLOVE BIOGEL PI INDICATOR 7.0 (GLOVE)
GOWN STRL REUS W/ TWL LRG LVL3 (GOWN DISPOSABLE) ×1 IMPLANT
GOWN STRL REUS W/ TWL XL LVL3 (GOWN DISPOSABLE) ×1 IMPLANT
GOWN STRL REUS W/TWL LRG LVL3 (GOWN DISPOSABLE) ×3
GOWN STRL REUS W/TWL XL LVL3 (GOWN DISPOSABLE) ×3
IV NS 1000ML (IV SOLUTION) ×3
IV NS 1000ML BAXH (IV SOLUTION) ×1 IMPLANT
KIT BASIN OR (CUSTOM PROCEDURE TRAY) ×3 IMPLANT
KIT TURNOVER KIT B (KITS) ×3 IMPLANT
NDL HYPO 25GX1X1/2 BEV (NEEDLE) ×2 IMPLANT
NDL PRECISIONGLIDE 27X1.5 (NEEDLE) IMPLANT
NEEDLE HYPO 25GX1X1/2 BEV (NEEDLE) ×6 IMPLANT
NEEDLE PRECISIONGLIDE 27X1.5 (NEEDLE) IMPLANT
NS IRRIG 1000ML POUR BTL (IV SOLUTION) ×3 IMPLANT
PAD ARMBOARD 7.5X6 YLW CONV (MISCELLANEOUS) ×3 IMPLANT
POSITIONER HEAD DONUT 9IN (MISCELLANEOUS) ×3 IMPLANT
SLEEVE IRRIGATION ELITE 7 (MISCELLANEOUS) ×3 IMPLANT
SPONGE SURGIFOAM ABS GEL 12-7 (HEMOSTASIS) IMPLANT
SUT CHROMIC 3 0 PS 2 (SUTURE) ×3 IMPLANT
SYR CONTROL 10ML LL (SYRINGE) ×3 IMPLANT
TRAY ENT MC OR (CUSTOM PROCEDURE TRAY) ×3 IMPLANT
TUBING IRRIGATION (MISCELLANEOUS) ×3 IMPLANT
YANKAUER SUCT BULB TIP NO VENT (SUCTIONS) ×3 IMPLANT

## 2019-03-03 NOTE — Transfer of Care (Signed)
Immediate Anesthesia Transfer of Care Note  Patient: Morgan Bean  Procedure(s) Performed: MULTIPLE EXTRACTION WITH ALVEOLOPLASTY (N/A )  Patient Location: PACU  Anesthesia Type:General  Level of Consciousness: oriented, drowsy and patient cooperative  Airway & Oxygen Therapy: Patient Spontanous Breathing and Patient connected to nasal cannula oxygen  Post-op Assessment: Report given to RN and Post -op Vital signs reviewed and stable  Post vital signs: Reviewed  Last Vitals:  Vitals Value Taken Time  BP 196/104 03/03/19 1057  Temp 36.1 C 03/03/19 1057  Pulse 86 03/03/19 1102  Resp 23 03/03/19 1102  SpO2 96 % 03/03/19 1102  Vitals shown include unvalidated device data.  Last Pain:  Vitals:   03/03/19 1057  PainSc: 0-No pain         Complications: No apparent anesthesia complications

## 2019-03-03 NOTE — Op Note (Signed)
03/03/2019  10:24 AM  PATIENT:  Morgan Bean  53 y.o. female  PRE-OPERATIVE DIAGNOSIS:  NON RESTORABLE TEETH SECONDARY TO DENTAL CARIES AND CHRONIC SEVERE PERIODONTITIS # 2, 4, 5, 6, 7, 8, 11, 12, 13, 14, 17, 20, 21, 22, 23, 24, 25, 26, 27, RIGHT MANDIBULAR LINGUAL EXOSTOSIS  POST-OPERATIVE DIAGNOSIS:  SAME  PROCEDURE:  Procedure(s): MULTIPLE EXTRACTION WITH ALVEOLOPLASTY # 2, 4, 5, 6, 7, 8, 11, 12, 13, 14, 17, 20, 21, 22, 23, 24, 25, 26, 27, REMOVAL RIGHT MANDIBULAR LINGUAL EXOSTOSIS    SURGEON:  Surgeon(s): [name], DDS  ANESTHESIA:   local and general  EBL:  minimal  DRAINS: none   SPECIMEN:  No Specimen  COUNTS:  YES  PLAN OF CARE: Discharge to home after PACU  PATIENT DISPOSITION:  PACU - hemodynamically stable.   PROCEDURE DETAILS: Dictation [id number]  Gae Bon, DMD 03/03/2019 10:24 AM

## 2019-03-03 NOTE — Anesthesia Procedure Notes (Signed)
Procedure Name: Intubation Date/Time: 03/03/2019 9:35 AM Performed by: Jenne Campus, CRNA Pre-anesthesia Checklist: Patient identified, Emergency Drugs available, Suction available and Patient being monitored Patient Re-evaluated:Patient Re-evaluated prior to induction Oxygen Delivery Method: Circle System Utilized Preoxygenation: Pre-oxygenation with 100% oxygen Induction Type: IV induction Ventilation: Mask ventilation without difficulty Laryngoscope Size: Glidescope and 3 Grade View: Grade I Tube type: Oral Tube size: 7.0 mm Number of attempts: 1 Airway Equipment and Method: Stylet Placement Confirmation: ETT inserted through vocal cords under direct vision,  positive ETCO2 and breath sounds checked- equal and bilateral Secured at: 20 cm Tube secured with: Tape Dental Injury: Teeth and Oropharynx as per pre-operative assessment  Comments: Elective videoglide with oral ETT for teeth removal. Patient with history of recent epiglottitis. DL x 1 Glide 3. Airway structures swollen. Right arytenoid swollen to the point of blocking the glottic opening. Able to gently pass 7.0 ETT around arytenoid and through cords. +ETCO2. BBSE.

## 2019-03-03 NOTE — H&P (Signed)
HISTORY AND PHYSICAL  Morgan Bean is a 53 y.o. female patient with CC: painful teeth  No diagnosis found.  Past Medical History:  Diagnosis Date  . Anemia of other chronic disease 11/13/2013  . Anginal pain ([place])    none in past year  . Anxiety   . Asthma    patient denies  . Candidiasis 06/23/2013  . Chronic diastolic CHF (congestive heart failure) ([place])   . Constipation   . Coronary artery disease    Lexiscan myoview (10/12) with significant ST depression upon Lexiscan injection but no ischemia or infarction on perfusion images. Left heart cath (10/12): 30% mid LAD, 30% ostial D1, 50% ostial D2.    [name] Cough 11/13/2013  . Depression   . Headache 02/22/2014  . Hx of cardiovascular stress test    Lexiscan Myoview (2/16): Breast attenuation artifact, no ischemia, EF 64%; low risk  . Hyperlipemia   . Hypertension   . Iron deficiency    hx of  . Leukocytoclastic vasculitis ([place])    Rash across lower body, occurred in 9/11, diagnosed by skin biopsy. ANA positive. Thought to be secondary to cocaine use.  . Leukopenia 03/21/2013  . Lymphadenopathy 03/21/2013  . Lymphadenopathy 01/30/2014  . Mental disorder   . Pancytopenia ([place])   . Pancytopenia, acquired ([place]) 07/16/2015  . Polysubstance abuse ([place])    Prior cocaine  . Pulmonary HTN ([place])    Echo (9/11) with EF 65%, mild LVH, mild AI, mild MR, moderate to possibly severe TR with PA systolic pressure 52 mmHg. Echo (10/12): severe LV hypertrophy, EF 60-65%, mild MR, mild AI, moderate to severe tricuspid regurgitation, PA systolic pressure 38 mmHg.  Echo 4/14: moderate LVH, EF 65%, normal wall motion, diastolic dysfunction, mild AI, mild MR  . Shortness of breath    a. PFTs 5/14:  FEV1/FVC 99% predicted; FVC 60% predicted; DLCO mildly reduced, mild restriction, air trapping.;  b.  seen by pulmo (Dr. [name]) 5/14: mainly upper airway symptoms - ACE d/c'd (sx's better)  . Stroke ([place]) 2010ish  . Syphilis 04/25/2013  . Tobacco abuse   .  Tricuspid regurgitation   . Unspecified deficiency anemia 03/29/2013    Current Facility-Administered Medications  Medication Dose Route Frequency Provider Last Rate Last Admin  . ceFAZolin (ANCEF) IVPB 2g/100 mL premix  2 g Intravenous On Call to OR [name], DDS      . lactated ringers infusion   Intravenous Continuous Janeece Riggers, MD 10 mL/hr at 03/03/19 0812 New Bag at 03/03/19 [id number]   Allergies  Allergen Reactions  . Ciprofloxacin Itching   Active Problems:   * No active hospital problems. *  Vitals: Blood pressure (!) 171/101, pulse 75, temperature 97.8 F (36.6 C), resp. rate 18, height 5\' 6"  (1.676 m), last menstrual period 05/10/2013, SpO2 100 %. Lab results: Results for orders placed or performed during the hospital encounter of 03/03/19 (from the past 24 hour(s))  PT- INR Day of Surgery     Status: None   Collection Time: 03/03/19  7:52 AM  Result Value Ref Range   Prothrombin Time 13.7 11.4 - 15.2 seconds   INR 1.1 0.8 - 1.2   Radiology Results: No results found. General appearance: alert, cooperative and no distress Head: Normocephalic, without obvious abnormality, atraumatic Eyes: negative Nose: Nares normal. Septum midline. Mucosa normal. No drainage or sinus tenderness. Throat: multiple dental caries, periodontal disease. No purulence, edema, fluctuance, trismus. Pharynx clear. Neck: no adenopathy and supple, symmetrical, trachea midline Resp: clear to  auscultation bilaterally Cardio: regular rate and rhythm, S1, S2 normal, no murmur, click, rub or gallop  Assessment: All teeth non-restorable secondary to dental caries, periodontitis.  Plan:Full mouth extractions with alveoloplasty. GA, Day surgery   [name] 03/03/2019

## 2019-03-03 NOTE — Telephone Encounter (Signed)
I left a voicemail for pt 03/03/19 10:40am.  We do not dispense narcotics at [facility] and [place], the script for Percocet will have to be sent to another pharmacy please.  We will delete this rx.  Thanks.

## 2019-03-03 NOTE — Op Note (Signed)
Morgan, Bean MEDICAL RECORD [id number] ACCOUNT [id number] DATE OF BIRTH:May 24, 1965 FACILITY: [facility] LOCATION: [facility] PHYSICIAN:Muslima Toppins M. Zylen Wenig, DDS  OPERATIVE REPORT  DATE OF PROCEDURE:  03/03/2019  PREOPERATIVE DIAGNOSES:   1.  Nonrestorable teeth secondary to dental caries and severe periodontal periodontitis, numbers 2, 4, 5, 6, 7, 8, 11, 12, 13, 14, 17, 20, 21, 22, 23, 24, 25, 26, 27.   2.  Right mandibular posterior lingual exostosis.  POSTOPERATIVE DIAGNOSES: 1.  Nonrestorable teeth secondary to dental caries and severe periodontal periodontitis, numbers 2, 4, 5, 6, 7, 8, 11, 12, 13, 14, 17, 20, 21, 22, 23, 24, 25, 26, 27.   2.  Right mandibular posterior lingual exostosis.  PROCEDURES:   1.  Extraction of teeth numbers 2, 4, 5, 6, 7, 8, 11, 12, 13, 14, 17, 20, 21, 22, 23, 24, 25, 26, 27. 2.  Alveoplasty right and left maxilla and mandible.  3.  Removal of right mandibular posterior lingual torus exostosis.  SURGEON:  [name], DDS  ANESTHESIA:  General, Dr. Ambrose Pancoast attending, oral intubation.  DESCRIPTION OF PROCEDURE:  The patient was taken to the operating room and placed on the table in the supine position.  General anesthesia was administered intravenously.  An oral endotracheal tube was placed and secured.  The eyes were protected and the  patient was draped for surgery.  A timeout was performed.  The posterior pharynx was suctioned and a throat pack was placed.  Two percent lidocaine with 1:100,000 epinephrine was infiltrated in an inferior alveolar block on the right and left sides and  a buccal and palatal infiltration in the maxilla.  A 15 blade was used to make an incision, beginning at tooth #17 in the left mandible, carried forward along the alveolar crest to tooth #20 and then it was carried around buccally and lingually in the  gingival sulcus until tooth #27 was encountered.  The periosteum was reflected.  The teeth were elevated and removed with the  dental forceps.  The sockets were curetted.  A large amount of granulation tissue was removed in the area of teeth #21 and #22  at the apex of the root areas.  The periosteum was reflected to expose the alveolar crest of the left mandible and then the alveoplasty was performed using an egg-shaped bur and bone file.  Then, the area was irrigated and closed with 3-0 chromic.  A 15  blade was then used to make an incision around teeth numbers 14, 13, 12 and 11 in the left maxilla.  The periosteum was reflected.  The teeth were elevated and removed from the mouth with the dental forceps.  Sockets were then curetted and then  alveoplasty was performed using an egg bur and bone file.  Then, the area was irrigated and closed with 3-0 chromic.  Then, the endotracheal tube was repositioned and resecured to the left side of the mouth and a bite block and sweetheart retractor were  placed on this side as well for access to the right side.  A 15 blade was used to make an incision around tooth #17, carried forward posteriorly along the alveolar crest until the retromolar region was reached.  The periosteum was reflected.  Tooth #27  was removed with the forceps and then attention was turned to the right maxilla.  A 15 blade was used to make an incision around teeth numbers 2, 4, 5 and 6.  The periosteum was reflected.  The teeth were elevated and removed with the  dental forceps.   The sockets were curetted.  Periosteum was reflected to expose the alveolar crest.  The sockets were curetted and then alveoplasty was performed using an egg bur and bone file.  An alveoloplasty was then performed in the right anterior mandible around  tooth #27 using an egg bur and bone file and then the right lingual exostosis, which was adjacent to the retromolar region, was accessed with a periosteal elevator.  A Seldin retractor was used to retract the soft tissues for protection and then a  rongeur, followed by the egg-shaped bur and bone  file were used to remove the exostosis.  Then, the right maxilla and mandible were irrigated and closed with 3-0 chromic.  The oral cavity was then irrigated and suctioned and the throat pack was removed.   The patient was left in care of anesthesia for extubation with plans for discharge home after a stay in recovery.  ESTIMATED BLOOD LOSS:  Minimal.  COMPLICATIONS:  None.  SPECIMENS:  None.  VN/NUANCE  D:03/03/2019 T:03/03/2019 JOB:[id number]

## 2019-03-05 NOTE — Anesthesia Postprocedure Evaluation (Signed)
Anesthesia Post Note  Patient: Morgan Bean  Procedure(s) Performed: MULTIPLE EXTRACTION WITH ALVEOLOPLASTY (N/A )     Anesthesia Post Evaluation  Last Vitals:  Vitals:   03/03/19 1127 03/03/19 1142  BP: (!) 144/93 137/88  Pulse: 85 85  Resp: (!) 21 20  Temp:    SpO2: 96% 95%    Last Pain:  Vitals:   03/03/19 1145  PainSc: 0-No pain                 Mattis Featherly

## 2019-03-07 ENCOUNTER — Encounter: Payer: Self-pay | Admitting: Sports Medicine

## 2019-03-07 ENCOUNTER — Ambulatory Visit: Payer: Medicaid Other | Admitting: Sports Medicine

## 2019-03-07 ENCOUNTER — Other Ambulatory Visit: Payer: Self-pay

## 2019-03-07 VITALS — BP 163/108 | HR 86 | Resp 16

## 2019-03-07 DIAGNOSIS — D492 Neoplasm of unspecified behavior of bone, soft tissue, and skin: Secondary | ICD-10-CM | POA: Diagnosis not present

## 2019-03-07 DIAGNOSIS — Q828 Other specified congenital malformations of skin: Secondary | ICD-10-CM

## 2019-03-07 DIAGNOSIS — M79671 Pain in right foot: Secondary | ICD-10-CM

## 2019-03-07 DIAGNOSIS — M79672 Pain in left foot: Secondary | ICD-10-CM

## 2019-03-07 NOTE — Progress Notes (Signed)
Subjective: Morgan Bean is a 53 y.o. female patient who presents to office for evaluation of Right> Left foot pain secondary to callus skin. Patient complains of pain at the lesion present Right>Left foot at the plantar aspect at callus areas. Patient has tried self trimming  with no relief in symptoms. Patient denies any other pedal complaints.   Review of Systems  All other systems reviewed and are negative.    Patient Active Problem List   Diagnosis Date Noted  . Autoimmune disease ([place]) 12/27/2018  . Laryngitis, chronic 12/26/2018  . Febrile neutropenia ([place]) 12/16/2018  . Chronic anticoagulation 10/14/2018  . CRI (chronic renal insufficiency), stage 3 (moderate) 10/14/2018  . Upper airway cough syndrome 06/30/2018  . Tobacco dependence in remission 04/11/2018  . Substance abuse in remission ([place]) 04/11/2018  . Depression, recurrent ([place]) 04/11/2018  . PAF (paroxysmal atrial fibrillation) ([place]) 03/10/2018  . Acute pulmonary edema ([name])   . Atrial fibrillation with RVR ([place])   . Nonrheumatic mitral valve regurgitation   . Acute on chronic diastolic CHF (congestive heart failure) ([place]) 03/08/2018  . Hypertensive urgency 03/08/2018  . Acute respiratory failure with hypoxia ([place]) 03/08/2018  . Unspecified atrial flutter ([place]) 04/25/2016  . Tobacco use 11/30/2015  . Pancytopenia, acquired ([place]) 07/16/2015  . Head and neck lymphadenopathy 07/16/2015  . 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 Absolute anemia 02/25/2015  . Preventive measure 12/27/2014  . 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 History of stroke 12/26/2014  . 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 Headache 02/22/2014  . Dental caries 01/09/2014  . Cough 11/13/2013  . Cigarette smoker 11/13/2013  . Swelling of gums 11/13/2013  . Fatigue 11/10/2013  . Intracranial atherosclerosis 11/10/2013  . Hyperreflexia 10/04/2013  . TIA (transient ischemic attack) 10/04/2013  . Fibroid  uterus 09/14/2012  . Perimenopause 09/14/2012  . Chronic cough 08/12/2012  . DOE (dyspnea on exertion) 08/12/2012  . History of CVA (cerebrovascular accident) 08/09/2012  . Chest pain, mid sternal 04/07/2012  . Pulmonary hypertension ([place]) 04/06/2012  . Polysubstance abuse ([place]) 04/06/2012  . History of cocaine abuse ([place]) 11/16/2011  . Major depressive disorder, recurrent episode with psychotic features. 11/15/2011    Class: Acute  . Chronic diastolic heart failure ([place]) 02/24/2011  . CAD (coronary artery disease) 02/24/2011  . Hyperlipidemia 01/06/2011  . Essential hypertension 12/15/2010  . Exertional dyspnea 12/15/2010  . 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 amiodarone (PACERONE) 200 MG tablet Take 1 tablet (200 mg total) by mouth daily. (Patient taking differently: Take 200 mg by mouth 2 (two) times daily. ) 90 tablet 2  . amoxicillin (AMOXIL) 500 MG capsule Take 1 capsule (500 mg total) by mouth 3 (three) times daily. 21 capsule 0  . ARIPiprazole (ABILIFY) 5 MG tablet Take 5 mg by mouth daily.    [name] atorvastatin (LIPITOR) 40 MG tablet Take 1 tablet (40 mg total) by mouth at bedtime. 30 tablet 2  . buPROPion (WELLBUTRIN SR) 150 MG 12 hr tablet Take 1 tablet (150 mg total) by mouth 2 (two) times daily. 60 tablet 2  . citalopram (CELEXA) 40 MG tablet Take 1 tablet (40 mg total) by mouth daily. 30 tablet 2  . cloNIDine (CATAPRES) 0.1 MG tablet Take 1 tablet (0.1 mg total) by mouth 2 (two) times daily. 60 tablet 2  . diltiazem (CARDIZEM CD) 180 MG 24 hr capsule Take 1  capsule (180 mg total) by mouth daily. 60 capsule 2  . ferrous sulfate 325 (65 FE) MG EC tablet Take 1 tablet (325 mg total) by mouth 3 (three) times daily with meals. 60 tablet 3  . furosemide (LASIX) 40 MG tablet Take 1 tablet (40 mg total) by mouth 2 (two) times daily. 90 tablet 3  . hydrOXYzine (ATARAX/VISTARIL) 50 MG  tablet Take 50 mg by mouth 4 (four) times daily as needed for anxiety.     . nicotine (NICODERM CQ) 21 mg/24hr patch Place 1 patch (21 mg total) onto the skin daily. USE FOR 6 WEEKS AND THEN START THE 14 MG 42 patch 0  . oxyCODONE-acetaminophen (PERCOCET) 5-325 MG tablet Take 1 tablet by mouth every 4 (four) hours as needed. 30 tablet 0  . rivaroxaban (XARELTO) 20 MG TABS tablet Take 20 mg by mouth daily with supper.    . topiramate (TOPAMAX) 25 MG tablet Take 2 tablets (50 mg total) by mouth at bedtime. 60 tablet 2  . traZODone (DESYREL) 150 MG tablet Take 1 tablet (150 mg total) by mouth at bedtime. 30 tablet 2  . lisinopril (PRINIVIL,ZESTRIL) 10 MG tablet Take 1 tablet (10 mg total) by mouth daily. 90 tablet 3   No current facility-administered medications on file prior to visit.    Allergies  Allergen Reactions  . Ciprofloxacin Itching    Objective:  General: Alert and oriented x3 in no acute distress  Dermatology: Keratotic lesion present sub met 1 on left, sub met 5 and heels bilateral with skin lines transversing the lesion, pain is present with direct pressure to the lesion with a central nucleated core noted, no webspace macerations, no ecchymosis bilateral, all nails x 10 are thick but well manicured.   Vascular: Dorsalis Pedis and Posterior Tibial pedal pulses 2/4, Capillary Fill Time 3 seconds, + pedal hair growth bilateral, no edema bilateral lower extremities, Temperature gradient within normal limits.  Neurology: Johney Maine sensation intact via light touch bilateral.  Musculoskeletal: Mild tenderness with palpation at the keratotic lesion sites on Right>Left, Muscular strength 5/5 in all groups without pain or limitation on range of motion. No lower extremity muscular or boney deformity noted.  Assessment and Plan: Problem List Items Addressed This Visit    None    Visit Diagnoses    Porokeratosis    -  Primary   Foot pain, bilateral          -Complete examination  performed -Discussed treatment options -Parred keratoic lesion using a chisel blade x 5; treated the area withSalinocaine covered with bandaids -Gave sample of foot miracle cream to use as instructed  -Encouraged daily skin emollients -Encouraged use of pumice stone -Advised good supportive shoes and inserts -Patient to return to office as in 2 months or sooner if condition worsens. Her insurance does not cover custom insoles.   Landis Martins, DPM

## 2019-04-13 ENCOUNTER — Ambulatory Visit: Payer: Medicaid Other | Admitting: Internal Medicine

## 2019-04-14 ENCOUNTER — Ambulatory Visit: Payer: Medicaid Other | Admitting: Internal Medicine

## 2019-04-19 ENCOUNTER — Other Ambulatory Visit: Payer: Self-pay | Admitting: Cardiovascular Disease

## 2019-04-24 ENCOUNTER — Other Ambulatory Visit: Payer: Self-pay | Admitting: Cardiology

## 2019-05-09 ENCOUNTER — Encounter: Payer: Self-pay | Admitting: Sports Medicine

## 2019-05-09 ENCOUNTER — Ambulatory Visit: Payer: Medicaid Other | Admitting: Sports Medicine

## 2019-05-09 ENCOUNTER — Other Ambulatory Visit: Payer: Self-pay

## 2019-05-09 DIAGNOSIS — M79671 Pain in right foot: Secondary | ICD-10-CM

## 2019-05-09 DIAGNOSIS — Q828 Other specified congenital malformations of skin: Secondary | ICD-10-CM

## 2019-05-09 DIAGNOSIS — M79672 Pain in left foot: Secondary | ICD-10-CM

## 2019-05-09 NOTE — Progress Notes (Signed)
Subjective: Larenda Reedy is a 54 y.o. female patient who presents to office for follow up evaluation of foot pain secondary to callus skin ib both feet. Patient reports that last trim helped but over the last week callus areas have started to feel sore again. Patient denies any other pedal complaints.    Patient Active Problem List   Diagnosis Date Noted  . Autoimmune disease ([place]) 12/27/2018  . Laryngitis, chronic 12/26/2018  . Febrile neutropenia ([place]) 12/16/2018  . Chronic anticoagulation 10/14/2018  . CRI (chronic renal insufficiency), stage 3 (moderate) 10/14/2018  . Upper airway cough syndrome 06/30/2018  . Tobacco dependence in remission 04/11/2018  . Substance abuse in remission ([place]) 04/11/2018  . Depression, recurrent ([place]) 04/11/2018  . PAF (paroxysmal atrial fibrillation) ([place]) 03/10/2018  . Acute pulmonary edema ([name])   . Atrial fibrillation with RVR ([place])   . Nonrheumatic mitral valve regurgitation   . Acute on chronic diastolic CHF (congestive heart failure) ([place]) 03/08/2018  . Hypertensive urgency 03/08/2018  . Acute respiratory failure with hypoxia ([place]) 03/08/2018  . Unspecified atrial flutter ([place]) 04/25/2016  . Tobacco use 11/30/2015  . Pancytopenia, acquired ([place]) 07/16/2015  . Head and neck lymphadenopathy 07/16/2015  . 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 Absolute anemia 02/25/2015  . Preventive measure 12/27/2014  . 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 History of stroke 12/26/2014  . 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 Headache 02/22/2014  . Dental caries 01/09/2014  . Cough 11/13/2013  . Cigarette smoker 11/13/2013  . Swelling of gums 11/13/2013  . Fatigue 11/10/2013  . Intracranial atherosclerosis 11/10/2013  . Hyperreflexia 10/04/2013  . TIA (transient ischemic attack) 10/04/2013  . Fibroid uterus 09/14/2012  . Perimenopause 09/14/2012  . Chronic cough 08/12/2012  . DOE (dyspnea on exertion) 08/12/2012  .  History of CVA (cerebrovascular accident) 08/09/2012  . Chest pain, mid sternal 04/07/2012  . Pulmonary hypertension ([place]) 04/06/2012  . Polysubstance abuse ([place]) 04/06/2012  . History of cocaine abuse ([place]) 11/16/2011  . Major depressive disorder, recurrent episode with psychotic features. 11/15/2011    Class: Acute  . Chronic diastolic heart failure ([place]) 02/24/2011  . CAD (coronary artery disease) 02/24/2011  . Hyperlipidemia 01/06/2011  . Essential hypertension 12/15/2010  . Exertional dyspnea 12/15/2010  . 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 amiodarone (PACERONE) 200 MG tablet Take 1 tablet (200 mg total) by mouth daily. (Patient taking differently: Take 200 mg by mouth 2 (two) times daily. ) 90 tablet 2  . amoxicillin (AMOXIL) 500 MG capsule Take 1 capsule (500 mg total) by mouth 3 (three) times daily. 21 capsule 0  . ARIPiprazole (ABILIFY) 5 MG tablet Take 5 mg by mouth daily.    [name] atorvastatin (LIPITOR) 40 MG tablet Take 1 tablet (40 mg total) by mouth at bedtime. 30 tablet 2  . buPROPion (WELLBUTRIN SR) 150 MG 12 hr tablet Take 1 tablet (150 mg total) by mouth 2 (two) times daily. 60 tablet 2  . citalopram (CELEXA) 40 MG tablet Take 1 tablet (40 mg total) by mouth daily. 30 tablet 2  . cloNIDine (CATAPRES) 0.1 MG tablet Take 1 tablet (0.1 mg total) by mouth 2 (two) times daily. 60 tablet 2  . diltiazem (CARDIZEM CD) 180 MG 24 hr capsule Take 1 capsule (180 mg total) by mouth daily. 60 capsule 2  . ferrous sulfate 325 (65 FE) MG EC  tablet Take 1 tablet (325 mg total) by mouth 3 (three) times daily with meals. 60 tablet 3  . furosemide (LASIX) 40 MG tablet TAKE 1 TABLET (40 MG TOTAL) BY MOUTH 2 (TWO) TIMES DAILY. 180 tablet 2  . hydrOXYzine (ATARAX/VISTARIL) 50 MG tablet Take 50 mg by mouth 4 (four) times daily as needed for anxiety.     [name] lisinopril (ZESTRIL) 10 MG tablet TAKE 1  TABLET (10 MG TOTAL) BY MOUTH DAILY. 90 tablet 2  . nicotine (NICODERM CQ) 21 mg/24hr patch Place 1 patch (21 mg total) onto the skin daily. USE FOR 6 WEEKS AND THEN START THE 14 MG 42 patch 0  . oxyCODONE-acetaminophen (PERCOCET) 5-325 MG tablet Take 1 tablet by mouth every 4 (four) hours as needed. 30 tablet 0  . topiramate (TOPAMAX) 25 MG tablet Take 2 tablets (50 mg total) by mouth at bedtime. 60 tablet 2  . traZODone (DESYREL) 150 MG tablet Take 1 tablet (150 mg total) by mouth at bedtime. 30 tablet 2  . Vitamins/Minerals TABS Take by mouth.    Alveda Reasons 20 MG TABS tablet TAKE 1 TABLET (20 MG TOTAL) BY MOUTH DAILY WITH SUPPER. 90 tablet 1   No current facility-administered medications on file prior to visit.    Allergies  Allergen Reactions  . Ciprofloxacin Itching    Objective:  General: Alert and oriented x3 in no acute distress  Dermatology: Keratotic lesion present sub met 1 on left, sub met 5 and right medial 1st toe and heels bilateral with skin lines transversing the lesion, pain is present with direct pressure to the lesion with a central nucleated core noted, no webspace macerations, no ecchymosis bilateral, all nails x 10 are thick and elongated.  Vascular: Dorsalis Pedis and Posterior Tibial pedal pulses 2/4, Capillary Fill Time 3 seconds, + pedal hair growth bilateral, no edema bilateral lower extremities, Temperature gradient within normal limits.  Neurology: Johney Maine sensation intact via light touch bilateral.  Musculoskeletal: Mild tenderness with palpation at the keratotic lesion sites on Right>Left, Muscular strength 5/5 in all groups without pain or limitation on range of motion. No lower extremity muscular or boney deformity noted.  Assessment and Plan: Problem List Items Addressed This Visit    None    Visit Diagnoses    Porokeratosis    -  Primary   Foot pain, bilateral          -Complete examination performed -Re-Discussed treatment options -ABN signed  and copy provided to patient  -Parred keratoic lesion using a chisel blade x 5; treated the area withSalinocaine covered with bandaids -At no charge additionally debrided nails x10 using sterile nail nipper without incident -Encouraged daily skin emollients -Encouraged use of pumice stone and soaking as needed to give relief to callus areas -Advised good supportive shoes and inserts and to avoid flip flops -Patient to return to office as in 2 months or sooner if condition worsens.   Landis Martins, DPM

## 2019-05-22 ENCOUNTER — Ambulatory Visit: Payer: Medicaid Other | Admitting: Cardiovascular Disease

## 2019-05-22 NOTE — Progress Notes (Deleted)
Cardiology Office Note   Date:  05/22/2019   ID:  Morgan Bean, DOB 1965/05/02, MRN [id number]  PCP:  [name], MD  Cardiologist:   [name], MD   No chief complaint on file.    History of Present Illness: Morgan Bean is a 54 y.o. female with chronic diastolic heart failure, mild pulmonary hypertension, prior CVA, CAD, atrial flutter s/p ablation, COPD, OSA not on CPAP, polysubstance abuse, depression, and anxiety here for follow up.  She was admitted 02/2018 with acute on chronic diastolic heart failure and atrial fibrillation/flutter with RVR.  She presented with 4 days of shortness of breath that occurred in the setting of heavy cocaine abuse.  She had not been taking her furosemide and reported that she often forgets to take her medications.  Echo that admission revealed LVEF 50 to 55% with mild aortic regurgitation and moderate to severe mitral regurgitation.  She was started on diltiazem and Xarelto.    Morgan Bean was seen in the ED 04/2018.  She initially came to see our pharmacist and reported shortness of breath, dizziness and chest pain.  There was concern for hypoxia.  EKG showed atrial flutter. She was referred to the ED where cardiac enzymes were negative and EKG was stable.  She has a CXR that showed no acute process.  Urine tox was positive for amphetamines though she denied any use of illegal or legal amphetamines.  She had AKI with creatinine elevated to 1.7.  She was instructed to hold lisinopril for 5 days.  She had a cardioversion that was initially successful but did not maintain.  She was started on amiodarone and subsequently maintained sinus rhythm.  She saw Dr. Melvyn Novas due to concern that amiodarone may affect her respiratory status.  However she was deemed okay to continue on amiodarone.    Since her last appointment Morgan Bean had an echocardiogram that showed improvement in her LVEF to 60 to 65%.  Her mitral valve was only leaking mildly.  She has mild to  moderate aortic regurgitation.   Past Medical History:  Diagnosis Date  . Anemia of other chronic disease 11/13/2013  . Anginal pain ([place])    none in past year  . Anxiety   . Asthma    patient denies  . Candidiasis 06/23/2013  . Chronic diastolic CHF (congestive heart failure) ([place])   . Constipation   . Coronary artery disease    Lexiscan myoview (10/12) with significant ST depression upon Lexiscan injection but no ischemia or infarction on perfusion images. Left heart cath (10/12): 30% mid LAD, 30% ostial D1, 50% ostial D2.    [name] Cough 11/13/2013  . Depression   . Headache 02/22/2014  . Hx of cardiovascular stress test    Lexiscan Myoview (2/16): Breast attenuation artifact, no ischemia, EF 64%; low risk  . Hyperlipemia   . Hypertension   . Iron deficiency    hx of  . Leukocytoclastic vasculitis ([place])    Rash across lower body, occurred in 9/11, diagnosed by skin biopsy. ANA positive. Thought to be secondary to cocaine use.  . Leukopenia 03/21/2013  . Lymphadenopathy 03/21/2013  . Lymphadenopathy 01/30/2014  . Mental disorder   . Pancytopenia ([place])   . Pancytopenia, acquired ([place]) 07/16/2015  . Polysubstance abuse ([place])    Prior cocaine  . Pulmonary HTN ([place])    Echo (9/11) with EF 65%, mild LVH, mild AI, mild MR, moderate to possibly severe TR with PA systolic pressure 52 mmHg. Echo (10/12):  severe LV hypertrophy, EF 60-65%, mild MR, mild AI, moderate to severe tricuspid regurgitation, PA systolic pressure 38 mmHg.  Echo 4/14: moderate LVH, EF 65%, normal wall motion, diastolic dysfunction, mild AI, mild MR  . Shortness of breath    a. PFTs 5/14:  FEV1/FVC 99% predicted; FVC 60% predicted; DLCO mildly reduced, mild restriction, air trapping.;  b.  seen by pulmo (Dr. [name]) 5/14: mainly upper airway symptoms - ACE d/c'd (sx's better)  . Stroke ([place]) 2010ish  . Syphilis 04/25/2013  . Tobacco abuse   . Tricuspid regurgitation   . Unspecified deficiency anemia 03/29/2013    Past  Surgical History:  Procedure Laterality Date  . ANKLE SURGERY     right  . BONE MARROW ASPIRATION    . CARDIAC CATHETERIZATION    . CARDIAC CATHETERIZATION N/A 04/23/2015   Procedure: Right Heart Cath;  Surgeon: [name], MD;  Location: [facility];  Service: Cardiovascular;  Laterality: N/A;  . CARDIOVERSION N/A 03/11/2018   Procedure: CARDIOVERSION;  Surgeon: [name], MD;  Location: [facility];  Service: Cardiovascular;  Laterality: N/A;  . I & D EXTREMITY  08/09/2011   Procedure: IRRIGATION AND DEBRIDEMENT EXTREMITY;  Surgeon: [name], MD;  Location: [facility];  Service: General;  Laterality: Left;  i & D Left axilla abscess  . LYMPH NODE BIOPSY N/A 04/05/2013   Procedure: EXCISIONAL BIOPSY RIGHT SUBMANDIBULAR NODE, NASAL ENDOSCOPIC WITH BIOPSY NASAL PHARYNX;  Surgeon: [name], MD;  Location: [facility];  Service: ENT;  Laterality: N/A;  . MULTIPLE EXTRACTIONS WITH ALVEOLOPLASTY N/A 03/03/2019   Procedure: MULTIPLE EXTRACTION WITH ALVEOLOPLASTY;  Surgeon: [name], DDS;  Location: [facility];  Service: Oral Surgery;  Laterality: N/A;  . TEE WITHOUT CARDIOVERSION N/A 03/11/2018   Procedure: TRANSESOPHAGEAL ECHOCARDIOGRAM (TEE);  Surgeon: [name], MD;  Location: [facility] ENDOSCOPY;  Service: Cardiovascular;  Laterality: N/A;     Current Outpatient Medications  Medication Sig Dispense Refill  . amiodarone (PACERONE) 200 MG tablet Take 1 tablet (200 mg total) by mouth daily. (Patient taking differently: Take 200 mg by mouth 2 (two) times daily. ) 90 tablet 2  . amoxicillin (AMOXIL) 500 MG capsule Take 1 capsule (500 mg total) by mouth 3 (three) times daily. 21 capsule 0  . ARIPiprazole (ABILIFY) 5 MG tablet Take 5 mg by mouth daily.    [name] atorvastatin (LIPITOR) 40 MG tablet Take 1 tablet (40 mg total) by mouth at bedtime. 30 tablet 2  . buPROPion (WELLBUTRIN SR) 150 MG 12 hr tablet Take 1 tablet (150 mg total) by mouth 2 (two) times daily. 60 tablet 2  . citalopram  (CELEXA) 40 MG tablet Take 1 tablet (40 mg total) by mouth daily. 30 tablet 2  . cloNIDine (CATAPRES) 0.1 MG tablet Take 1 tablet (0.1 mg total) by mouth 2 (two) times daily. 60 tablet 2  . diltiazem (CARDIZEM CD) 180 MG 24 hr capsule Take 1 capsule (180 mg total) by mouth daily. 60 capsule 2  . ferrous sulfate 325 (65 FE) MG EC tablet Take 1 tablet (325 mg total) by mouth 3 (three) times daily with meals. 60 tablet 3  . furosemide (LASIX) 40 MG tablet TAKE 1 TABLET (40 MG TOTAL) BY MOUTH 2 (TWO) TIMES DAILY. 180 tablet 2  . hydrOXYzine (ATARAX/VISTARIL) 50 MG tablet Take 50 mg by mouth 4 (four) times daily as needed for anxiety.     [name] lisinopril (ZESTRIL) 10 MG tablet TAKE 1 TABLET (10 MG  TOTAL) BY MOUTH DAILY. 90 tablet 2  . nicotine (NICODERM CQ) 21 mg/24hr patch Place 1 patch (21 mg total) onto the skin daily. USE FOR 6 WEEKS AND THEN START THE 14 MG 42 patch 0  . oxyCODONE-acetaminophen (PERCOCET) 5-325 MG tablet Take 1 tablet by mouth every 4 (four) hours as needed. 30 tablet 0  . topiramate (TOPAMAX) 25 MG tablet Take 2 tablets (50 mg total) by mouth at bedtime. 60 tablet 2  . traZODone (DESYREL) 150 MG tablet Take 1 tablet (150 mg total) by mouth at bedtime. 30 tablet 2  . Vitamins/Minerals TABS Take by mouth.    Alveda Reasons 20 MG TABS tablet TAKE 1 TABLET (20 MG TOTAL) BY MOUTH DAILY WITH SUPPER. 90 tablet 1   No current facility-administered medications for this visit.    Allergies:   Ciprofloxacin    Social History:  The patient  reports that she has been smoking cigarettes. She has a 15.00 pack-year smoking history. She has never used smokeless tobacco. She reports that she does not drink alcohol or use drugs.   Family History:  The patient's family history includes Asthma in her grandson; Breast cancer in her maternal grandmother; Coronary artery disease in her father and mother; Diabetes in her father, maternal grandmother, and mother; Heart attack in her father and maternal  grandmother; Hypertension in her father; Stroke in her maternal grandmother.    ROS:  Please see the history of present illness.   Otherwise, review of systems are positive for none.   All other systems are reviewed and negative.    PHYSICAL EXAM: VS:  LMP 05/10/2013  , BMI There is no height or weight on file to calculate BMI. GENERAL:  Well appearing HEENT: Pupils equal round and reactive, fundi not visualized, oral mucosa unremarkable.  S/p extraction of several teeth NECK:  No jugular venous distention, waveform within normal limits, carotid upstroke brisk and symmetric, no bruits, no thyromegaly LYMPHATICS:  No cervical adenopathy LUNGS:  Clear to auscultation bilaterally HEART:  RRR.  PMI not displaced or sustained,S1 and S2 within normal limits, no S3, no S4, no clicks, no rubs, II/VI systolic murmur at apex ABD:  Flat, positive bowel sounds normal in frequency in pitch, no bruits, no rebound, no guarding, no midline pulsatile mass, no hepatomegaly, no splenomegaly EXT:  2 plus pulses throughout, no edema, no cyanosis no clubbing SKIN:  No rashes no nodules NEURO:  Cranial nerves II through XII grossly intact, motor grossly intact throughout PSYCH:  Cognitively intact, oriented to person place and time   EKG:  EKG is not ordered today. The ekg ordered 03/25/18 demonstrates atrial flutter.  Variable ventricular response.  Ventricular rate 78 bpm 05/23/18: Atrial flutter.  Variable A-V block.  Rate 82 bpm.  Echo 03/09/18: Study Conclusions  - Left ventricle: The cavity size was normal. Wall thickness was   normal. Systolic function was normal. The estimated ejection   fraction was in the range of 50% to 55%. The study is not   technically sufficient to allow evaluation of LV diastolic   function. - Aortic valve: There was mild regurgitation. - Mitral valve: There was moderate to severe regurgitation. - Left atrium: The atrium was severely dilated. - Atrial septum: No defect or  patent foramen ovale was identified. - Tricuspid valve: There was moderate regurgitation. - Pulmonary arteries: PA peak pressure: 39 mm Hg (S).  TEE 03/11/18: Study Conclusions  - Left ventricle: LVEF is depressed. - Mitral valve: MV is mildly  thickened. Leaflets do not appear to   coapt well Annular distance is 39 mm There is moderate to severe   MR directed toward back of LA> - Left atrium: No evidence of thrombus in the atrial cavity or   appendage.  Impressions:  - Successful cardioversion. No cardiac source of emboli was   indentified.  Echo 01/25/2019: IMPRESSIONS    1. Left ventricular ejection fraction, by visual estimation, is 60 to  65%. The left ventricle has normal function. There is no left ventricular  hypertrophy.  2. Global right ventricle has normal systolic function.The right  ventricular size is normal. No increase in right ventricular wall  thickness.  3. Left atrial size was moderately dilated.  4. Right atrial size was normal.  5. The mitral valve is normal in structure. Mild mitral valve  regurgitation. No evidence of mitral stenosis.  6. The tricuspid valve is normal in structure. Tricuspid valve  regurgitation is mild.  7. Aortic valve regurgitation is mild to moderate.  8. The aortic valve is tricuspid. Aortic valve regurgitation is mild to  moderate. Mild aortic valve sclerosis without stenosis.  9. The pulmonic valve was grossly normal. Pulmonic valve regurgitation is  mild.  10. Mildly elevated pulmonary artery systolic pressure.  11. The inferior vena cava is normal in size with greater than 50%  respiratory variability, suggesting right atrial pressure of 3 mmHg.   Recent Labs: 03/01/2019: ALT 13; BUN 10; Creatinine, Ser 1.25; Hemoglobin 11.3; Platelets 265; Potassium 4.1; Sodium 141    Lipid Panel    Component Value Date/Time   CHOL 154 08/21/2015 1212   TRIG 109 08/21/2015 1212   HDL 43 (L) 08/21/2015 1212   CHOLHDL 3.6  08/21/2015 1212   VLDL 22 08/21/2015 1212   LDLCALC 89 08/21/2015 1212   LDLDIRECT 142.1 12/25/2010 0846      Wt Readings from Last 3 Encounters:  03/01/19 192 lb (87.1 kg)  01/06/19 205 lb 9.6 oz (93.3 kg)  12/27/18 203 lb 14.4 oz (92.5 kg)      ASSESSMENT AND PLAN:  # Persistent atrial flutter:  Ms. Newville previously underwent ablation and had a DCCV 02/2018.  Today she is back in sinus rhythm and doing well.  She is tolerating amiodarone.  Continue diltiazem, amiodarone, and Xarelto.    # Moderate to severe MR:  She is euvolemic.  Continue furosemide.  Initially she did not undergo surgery due to drug use.  She is doing well at this time.  We will repeat her echocardiogram to evaluate the severity of her mitral regurgitation.  If she has severe mitral vegetation and her history of atrial fibrillation would make her a candidate for replacement/repair.  She has been abstaining from illicit drugs.  Consider referral to CT surgery based on the echo results.   # Hypertension:  Her blood pressure was initially elevated and better on repeat.  Continue clonidine, diltiazem, and lisinopril.  # Chronic diastolic heart failure: She is euvolemic.  Continue BP control and lasix as above.  # CAD: Non-obstructive on cath in 2012.  No ischemia on stress 11/2015.  # Hyperlipidemia:  Continue atorvastatin.  # Prior stroke: Aspirin discontinued when she started Xarelto.  # Polysubstance abuse: Congratulated her on abstaining from cocaine.  She is interested in smoking cessation.  She would like to try nicotine patches.  She will start with 21 mg daily for 6 weeks followed by 14 mg for 2 weeks and 7 mg for 2 weeks.  Current medicines  are reviewed at length with the patient today.  The patient does not have concerns regarding medicines.  The following changes have been made: Start nicotine patches  Labs/ tests ordered today include:   No orders of the defined types were placed in this  encounter.    Disposition:   FU with Naiara Lombardozzi C. Oval Linsey, MD, [facility] in 4 months.      Signed, Delmy Holdren C. Oval Linsey, MD, [facility]  05/22/2019 8:09 AM    [place]

## 2019-06-13 ENCOUNTER — Encounter: Payer: Self-pay | Admitting: Cardiovascular Disease

## 2019-06-27 ENCOUNTER — Other Ambulatory Visit: Payer: Self-pay | Admitting: Cardiovascular Disease

## 2019-07-11 ENCOUNTER — Ambulatory Visit: Payer: Medicaid Other | Admitting: Sports Medicine

## 2019-07-19 NOTE — Progress Notes (Deleted)
Office Visit Note  Patient: Morgan Bean             Date of Birth: 1966/02/17           MRN: [id number]             PCP: [name], MD Referring: [name], MD Visit Date: 07/24/2019 Occupation: @GUAROCC @  Subjective:  No chief complaint on file.   History of Present Illness: Morgan Bean is a 54 y.o. female ***   Activities of Daily Living:  Patient reports morning stiffness for *** {minute/hour:19697}.   Patient {ACTIONS;DENIES/REPORTS:21021675::"Denies"} nocturnal pain.  Difficulty dressing/grooming: {ACTIONS;DENIES/REPORTS:21021675::"Denies"} Difficulty climbing stairs: {ACTIONS;DENIES/REPORTS:21021675::"Denies"} Difficulty getting out of chair: {ACTIONS;DENIES/REPORTS:21021675::"Denies"} Difficulty using hands for taps, buttons, cutlery, and/or writing: {ACTIONS;DENIES/REPORTS:21021675::"Denies"}  No Rheumatology ROS completed.   PMFS History:  Patient Active Problem List   Diagnosis Date Noted  . Autoimmune disease ([place]) 12/27/2018  . Laryngitis, chronic 12/26/2018  . Febrile neutropenia ([place]) 12/16/2018  . Chronic anticoagulation 10/14/2018  . CRI (chronic renal insufficiency), stage 3 (moderate) 10/14/2018  . Upper airway cough syndrome 06/30/2018  . Tobacco dependence in remission 04/11/2018  . Substance abuse in remission ([place]) 04/11/2018  . Depression, recurrent ([place]) 04/11/2018  . PAF (paroxysmal atrial fibrillation) ([place]) 03/10/2018  . Acute pulmonary edema ([name])   . Atrial fibrillation with RVR ([place])   . Nonrheumatic mitral valve regurgitation   . Acute on chronic diastolic CHF (congestive heart failure) ([place]) 03/08/2018  . Hypertensive urgency 03/08/2018  . Acute respiratory failure with hypoxia ([place]) 03/08/2018  . Unspecified atrial flutter ([place]) 04/25/2016  . Tobacco use 11/30/2015  . Pancytopenia, acquired ([place]) 07/16/2015  . Head and neck lymphadenopathy 07/16/2015  . 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 05/23/18   05/08/2015  . Absolute anemia 02/25/2015  . Preventive measure 12/27/2014  . 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 History of stroke 12/26/2014  . 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 Headache 02/22/2014  . Dental caries 01/09/2014  . Cough 11/13/2013  . Cigarette smoker 11/13/2013  . Swelling of gums 11/13/2013  . Fatigue 11/10/2013  . Intracranial atherosclerosis 11/10/2013  . Hyperreflexia 10/04/2013  . TIA (transient ischemic attack) 10/04/2013  . Fibroid uterus 09/14/2012  . Perimenopause 09/14/2012  . Chronic cough 08/12/2012  . DOE (dyspnea on exertion) 08/12/2012  . History of CVA (cerebrovascular accident) 08/09/2012  . Chest pain, mid sternal 04/07/2012  . Pulmonary hypertension ([place]) 04/06/2012  . Polysubstance abuse ([place]) 04/06/2012  . History of cocaine abuse ([place]) 11/16/2011  . Major depressive disorder, recurrent episode with psychotic features. 11/15/2011    Class: Acute  . Chronic diastolic heart failure ([place]) 02/24/2011  . CAD (coronary artery disease) 02/24/2011  . Hyperlipidemia 01/06/2011  . Essential hypertension 12/15/2010  . Exertional dyspnea 12/15/2010  . Leukocytoclastic vasculitis ([place]) 03/24/2007    Past Medical History:  Diagnosis Date  . Anemia of other chronic disease 11/13/2013  . Anginal pain ([place])    none in past year  . Anxiety   . Asthma    patient denies  . Candidiasis 06/23/2013  . Chronic diastolic CHF (congestive heart failure) ([place])   . Constipation   . Coronary artery disease    Lexiscan myoview (10/12) with significant ST depression upon Lexiscan injection but no ischemia or infarction on perfusion images. Left heart cath (10/12): 30% mid LAD, 30% ostial D1, 50% ostial D2.    [name] Cough 11/13/2013  . Depression   . Headache 02/22/2014  . Hx of cardiovascular stress test  Lexiscan Myoview (2/16): Breast attenuation artifact, no ischemia, EF 64%; low risk  . Hyperlipemia   . Hypertension   . Iron deficiency    hx of  .  Leukocytoclastic vasculitis ([place])    Rash across lower body, occurred in 9/11, diagnosed by skin biopsy. ANA positive. Thought to be secondary to cocaine use.  . Leukopenia 03/21/2013  . Lymphadenopathy 03/21/2013  . Lymphadenopathy 01/30/2014  . Mental disorder   . Pancytopenia ([place])   . Pancytopenia, acquired ([place]) 07/16/2015  . Polysubstance abuse ([place])    Prior cocaine  . Pulmonary HTN ([place])    Echo (9/11) with EF 65%, mild LVH, mild AI, mild MR, moderate to possibly severe TR with PA systolic pressure 52 mmHg. Echo (10/12): severe LV hypertrophy, EF 60-65%, mild MR, mild AI, moderate to severe tricuspid regurgitation, PA systolic pressure 38 mmHg.  Echo 4/14: moderate LVH, EF 65%, normal wall motion, diastolic dysfunction, mild AI, mild MR  . Shortness of breath    a. PFTs 5/14:  FEV1/FVC 99% predicted; FVC 60% predicted; DLCO mildly reduced, mild restriction, air trapping.;  b.  seen by pulmo (Dr. [name]) 5/14: mainly upper airway symptoms - ACE d/c'd (sx's better)  . Stroke ([place]) 2010ish  . Syphilis 04/25/2013  . Tobacco abuse   . Tricuspid regurgitation   . Unspecified deficiency anemia 03/29/2013    Family History  Problem Relation Age of Onset  . Coronary artery disease Mother   . Diabetes Mother   . Diabetes Father   . Hypertension Father   . Coronary artery disease Father   . Heart attack Father   . Breast cancer Maternal Grandmother   . Diabetes Maternal Grandmother   . Heart attack Maternal Grandmother   . Stroke Maternal Grandmother   . Asthma Grandson    Past Surgical History:  Procedure Laterality Date  . ANKLE SURGERY     right  . BONE MARROW ASPIRATION    . CARDIAC CATHETERIZATION    . CARDIAC CATHETERIZATION N/A 04/23/2015   Procedure: Right Heart Cath;  Surgeon: [name], MD;  Location: [facility];  Service: Cardiovascular;  Laterality: N/A;  . CARDIOVERSION N/A 03/11/2018   Procedure: CARDIOVERSION;  Surgeon: [name], MD;  Location: [place];  Service: Cardiovascular;  Laterality: N/A;  . I & D EXTREMITY  08/09/2011   Procedure: IRRIGATION AND DEBRIDEMENT EXTREMITY;  Surgeon: [name], MD;  Location: [facility];  Service: General;  Laterality: Left;  i & D Left axilla abscess  . LYMPH NODE BIOPSY N/A 04/05/2013   Procedure: EXCISIONAL BIOPSY RIGHT SUBMANDIBULAR NODE, NASAL ENDOSCOPIC WITH BIOPSY NASAL PHARYNX;  Surgeon: [name], MD;  Location: [place];  Service: ENT;  Laterality: N/A;  . MULTIPLE EXTRACTIONS WITH ALVEOLOPLASTY N/A 03/03/2019   Procedure: MULTIPLE EXTRACTION WITH ALVEOLOPLASTY;  Surgeon: [name], DDS;  Location: [facility];  Service: Oral Surgery;  Laterality: N/A;  . TEE WITHOUT CARDIOVERSION N/A 03/11/2018   Procedure: TRANSESOPHAGEAL ECHOCARDIOGRAM (TEE);  Surgeon: [name], MD;  Location: [facility] ENDOSCOPY;  Service: Cardiovascular;  Laterality: N/A;   Social History   Social History Narrative   Lives with mom, sisters and brothers.   Immunization History  Administered Date(s) Administered  . DT (Pediatric) 05/03/2013  . Influenza, Seasonal, Injecte, Preservative Fre 05/03/2013  . Influenza,inj,Quad PF,6+ Mos 12/27/2014, 03/12/2018  . Pneumococcal Polysaccharide-23 08/10/2011     Objective: Vital Signs: LMP 05/10/2013    Physical Exam   Musculoskeletal Exam: ***  CDAI Exam: CDAI Score: -- Patient Global: --; Provider Global: -- Swollen: --; Tender: -- Joint Exam 07/24/2019   No joint exam has been documented for this visit   There is currently no information documented on the homunculus. Go to the Rheumatology activity and complete the homunculus joint exam.  Investigation: No additional findings.  Imaging: No results found.  Recent Labs: Lab Results  Component Value Date   WBC 4.3 03/01/2019   HGB 11.3 (L) 03/01/2019   PLT 265 03/01/2019   NA 141 03/01/2019   K 4.1 03/01/2019   CL 108 03/01/2019   CO2 24 03/01/2019   GLUCOSE 93 03/01/2019   BUN 10 03/01/2019    CREATININE 1.25 (H) 03/01/2019   BILITOT <0.1 (L) 03/01/2019   ALKPHOS 61 03/01/2019   AST 14 (L) 03/01/2019   ALT 13 03/01/2019   PROT 7.8 03/01/2019   ALBUMIN 3.1 (L) 03/01/2019   CALCIUM 9.0 03/01/2019   GFRAA 56 (L) 03/01/2019    Speciality Comments: No specialty comments available.  Procedures:  No procedures performed Allergies: Ciprofloxacin   Assessment / Plan:     Visit Diagnoses: No diagnosis found.  Orders: No orders of the defined types were placed in this encounter.  No orders of the defined types were placed in this encounter.   Face-to-face time spent with patient was *** minutes. Greater than 50% of time was spent in counseling and coordination of care.  Follow-Up Instructions: No follow-ups on file.   Ofilia Neas, PA-C  Note - This record has been created using Dragon software.  Chart creation errors have been sought, but may not always  have been located. Such creation errors do not reflect on  the standard of medical care.

## 2019-07-24 ENCOUNTER — Ambulatory Visit: Payer: Medicaid Other | Admitting: Rheumatology

## 2019-08-28 ENCOUNTER — Other Ambulatory Visit: Payer: Self-pay | Admitting: Family Medicine

## 2019-08-28 DIAGNOSIS — I10 Essential (primary) hypertension: Secondary | ICD-10-CM

## 2019-08-28 DIAGNOSIS — G4485 Primary stabbing headache: Secondary | ICD-10-CM

## 2019-09-20 ENCOUNTER — Ambulatory Visit: Payer: Medicaid Other | Admitting: Cardiovascular Disease

## 2020-05-28 ENCOUNTER — Emergency Department (HOSPITAL_COMMUNITY): Payer: Medicaid Other

## 2020-05-28 ENCOUNTER — Encounter (HOSPITAL_COMMUNITY): Payer: Self-pay | Admitting: Internal Medicine

## 2020-05-28 ENCOUNTER — Other Ambulatory Visit: Payer: Self-pay

## 2020-05-28 ENCOUNTER — Inpatient Hospital Stay (HOSPITAL_COMMUNITY)
Admission: EM | Admit: 2020-05-28 | Discharge: 2020-05-29 | DRG: 308 | Disposition: A | Discharge status: Home / Self Care | Payer: Medicaid Other | Attending: Internal Medicine | Admitting: Internal Medicine

## 2020-05-28 DIAGNOSIS — Z9114 Patient's other noncompliance with medication regimen: Secondary | ICD-10-CM | POA: Diagnosis not present

## 2020-05-28 DIAGNOSIS — F32A Depression, unspecified: Secondary | ICD-10-CM | POA: Diagnosis present

## 2020-05-28 DIAGNOSIS — I4892 Unspecified atrial flutter: Secondary | ICD-10-CM | POA: Diagnosis present

## 2020-05-28 DIAGNOSIS — I4891 Unspecified atrial fibrillation: Secondary | ICD-10-CM | POA: Diagnosis not present

## 2020-05-28 DIAGNOSIS — F17210 Nicotine dependence, cigarettes, uncomplicated: Secondary | ICD-10-CM | POA: Diagnosis present

## 2020-05-28 DIAGNOSIS — Z8249 Family history of ischemic heart disease and other diseases of the circulatory system: Secondary | ICD-10-CM

## 2020-05-28 DIAGNOSIS — F419 Anxiety disorder, unspecified: Secondary | ICD-10-CM | POA: Diagnosis present

## 2020-05-28 DIAGNOSIS — Z7901 Long term (current) use of anticoagulants: Secondary | ICD-10-CM | POA: Diagnosis not present

## 2020-05-28 DIAGNOSIS — F1910 Other psychoactive substance abuse, uncomplicated: Secondary | ICD-10-CM

## 2020-05-28 DIAGNOSIS — Z853 Personal history of malignant neoplasm of breast: Secondary | ICD-10-CM | POA: Diagnosis not present

## 2020-05-28 DIAGNOSIS — I110 Hypertensive heart disease with heart failure: Secondary | ICD-10-CM | POA: Diagnosis present

## 2020-05-28 DIAGNOSIS — Z8673 Personal history of transient ischemic attack (TIA), and cerebral infarction without residual deficits: Secondary | ICD-10-CM

## 2020-05-28 DIAGNOSIS — Z20822 Contact with and (suspected) exposure to covid-19: Secondary | ICD-10-CM | POA: Diagnosis present

## 2020-05-28 DIAGNOSIS — I5033 Acute on chronic diastolic (congestive) heart failure: Secondary | ICD-10-CM | POA: Diagnosis present

## 2020-05-28 DIAGNOSIS — E876 Hypokalemia: Secondary | ICD-10-CM | POA: Diagnosis present

## 2020-05-28 DIAGNOSIS — Z881 Allergy status to other antibiotic agents status: Secondary | ICD-10-CM

## 2020-05-28 DIAGNOSIS — J449 Chronic obstructive pulmonary disease, unspecified: Secondary | ICD-10-CM | POA: Diagnosis present

## 2020-05-28 DIAGNOSIS — I081 Rheumatic disorders of both mitral and tricuspid valves: Secondary | ICD-10-CM | POA: Diagnosis present

## 2020-05-28 DIAGNOSIS — G4485 Primary stabbing headache: Secondary | ICD-10-CM | POA: Diagnosis present

## 2020-05-28 DIAGNOSIS — Z79899 Other long term (current) drug therapy: Secondary | ICD-10-CM | POA: Diagnosis not present

## 2020-05-28 DIAGNOSIS — E785 Hyperlipidemia, unspecified: Secondary | ICD-10-CM | POA: Diagnosis present

## 2020-05-28 DIAGNOSIS — I2510 Atherosclerotic heart disease of native coronary artery without angina pectoris: Secondary | ICD-10-CM | POA: Diagnosis present

## 2020-05-28 LAB — HEPATIC FUNCTION PANEL
ALT: 53 U/L — ABNORMAL HIGH (ref 0–44)
AST: 68 U/L — ABNORMAL HIGH (ref 15–41)
Albumin: 3.3 g/dL — ABNORMAL LOW (ref 3.5–5.0)
Alkaline Phosphatase: 95 U/L (ref 38–126)
Bilirubin, Direct: 0.5 mg/dL — ABNORMAL HIGH (ref 0.0–0.2)
Indirect Bilirubin: 0.9 mg/dL (ref 0.3–0.9)
Total Bilirubin: 1.4 mg/dL — ABNORMAL HIGH (ref 0.3–1.2)
Total Protein: 6.8 g/dL (ref 6.5–8.1)

## 2020-05-28 LAB — BASIC METABOLIC PANEL
Anion gap: 8 (ref 5–15)
BUN: 20 mg/dL (ref 6–20)
CO2: 21 mmol/L — ABNORMAL LOW (ref 22–32)
Calcium: 9 mg/dL (ref 8.9–10.3)
Chloride: 110 mmol/L (ref 98–111)
Creatinine, Ser: 1.2 mg/dL — ABNORMAL HIGH (ref 0.44–1.00)
GFR, Estimated: 53 mL/min — ABNORMAL LOW (ref >60)
Glucose, Bld: 104 mg/dL — ABNORMAL HIGH (ref 70–99)
Potassium: 3.4 mmol/L — ABNORMAL LOW (ref 3.5–5.1)
Sodium: 139 mmol/L (ref 135–145)

## 2020-05-28 LAB — CBC
HCT: 38.1 % (ref 36.0–46.0)
Hemoglobin: 12.3 g/dL (ref 12.0–15.0)
MCH: 26.5 pg (ref 26.0–34.0)
MCHC: 32.3 g/dL (ref 30.0–36.0)
MCV: 81.9 fL (ref 80.0–100.0)
Platelets: 200 10*3/uL (ref 150–400)
RBC: 4.65 MIL/uL (ref 3.87–5.11)
RDW: 14.6 % (ref 11.5–15.5)
WBC: 9.3 10*3/uL (ref 4.0–10.5)
nRBC: 0 % (ref 0.0–0.2)

## 2020-05-28 LAB — RESP PANEL BY RT-PCR (FLU A&B, COVID) ARPGX2
Influenza A by PCR: NEGATIVE
Influenza B by PCR: NEGATIVE
SARS Coronavirus 2 by RT PCR: NEGATIVE

## 2020-05-28 LAB — RAPID URINE DRUG SCREEN, HOSP PERFORMED
Amphetamines: NOT DETECTED
Barbiturates: NOT DETECTED
Benzodiazepines: NOT DETECTED
Cocaine: POSITIVE — AB
Opiates: NOT DETECTED
Tetrahydrocannabinol: NOT DETECTED

## 2020-05-28 LAB — I-STAT BETA HCG BLOOD, ED (MC, WL, AP ONLY): I-stat hCG, quantitative: <5 m[IU]/mL (ref <5)

## 2020-05-28 LAB — TROPONIN I (HIGH SENSITIVITY)
Troponin I (High Sensitivity): 23 ng/L — ABNORMAL HIGH (ref <18)
Troponin I (High Sensitivity): 19 ng/L — ABNORMAL HIGH (ref <18)

## 2020-05-28 LAB — ETHANOL: Alcohol, Ethyl (B): <10 mg/dL (ref <10)

## 2020-05-28 LAB — BRAIN NATRIURETIC PEPTIDE: B Natriuretic Peptide: 429.2 pg/mL — ABNORMAL HIGH (ref 0.0–100.0)

## 2020-05-28 LAB — HIV ANTIBODY (ROUTINE TESTING W REFLEX): HIV Screen 4th Generation wRfx: NONREACTIVE

## 2020-05-28 MED ORDER — ACETAMINOPHEN 650 MG RE SUPP: 650.0000 mg | Freq: Four times a day (QID) | RECTAL | Status: DC | PRN

## 2020-05-28 MED ORDER — ENOXAPARIN SODIUM 40 MG/0.4ML ~~LOC~~ SOLN: 40.0000 mg | SUBCUTANEOUS | Status: DC

## 2020-05-28 MED ORDER — CITALOPRAM HYDROBROMIDE 20 MG PO TABS
40.0000 mg | ORAL_TABLET | Freq: Every day | ORAL | Status: DC
  Administered 2020-05-28 – 2020-05-29 (×2): 40 mg via ORAL
  Filled 2020-05-28: qty 4
  Filled 2020-05-28: qty 2

## 2020-05-28 MED ORDER — FUROSEMIDE 10 MG/ML IJ SOLN
40.0000 mg | Freq: Once | INTRAMUSCULAR | Status: AC
  Administered 2020-05-28: 40 mg via INTRAVENOUS
  Filled 2020-05-28: qty 4

## 2020-05-28 MED ORDER — DILTIAZEM HCL ER COATED BEADS 180 MG PO CP24
180.0000 mg | ORAL_CAPSULE | Freq: Every day | ORAL | Status: DC
  Administered 2020-05-28 – 2020-05-29 (×2): 180 mg via ORAL
  Filled 2020-05-28 (×2): qty 1

## 2020-05-28 MED ORDER — RIVAROXABAN 20 MG PO TABS
20.0000 mg | ORAL_TABLET | Freq: Every day | ORAL | Status: DC
Start: 2020-05-28 — End: 2020-05-29
  Administered 2020-05-28 – 2020-05-29 (×2): 20 mg via ORAL
  Filled 2020-05-28 (×3): qty 1

## 2020-05-28 MED ORDER — DILTIAZEM HCL-DEXTROSE 125-5 MG/125ML-% IV SOLN (PREMIX)
5.0000 mg/h | INTRAVENOUS | Status: AC
  Administered 2020-05-28: 5 mg/h via INTRAVENOUS
  Filled 2020-05-28: qty 125

## 2020-05-28 MED ORDER — ATORVASTATIN CALCIUM 40 MG PO TABS
40.0000 mg | ORAL_TABLET | Freq: Every day | ORAL | Status: DC
Start: 2020-05-28 — End: 2020-05-29
  Administered 2020-05-28: 40 mg via ORAL
  Filled 2020-05-28: qty 1

## 2020-05-28 MED ORDER — AMIODARONE HCL 200 MG PO TABS
200.0000 mg | ORAL_TABLET | Freq: Every day | ORAL | Status: DC
  Administered 2020-05-28 – 2020-05-29 (×2): 200 mg via ORAL
  Filled 2020-05-28 (×2): qty 1

## 2020-05-28 MED ORDER — DILTIAZEM LOAD VIA INFUSION
20.0000 mg | Freq: Once | INTRAVENOUS | Status: AC
  Administered 2020-05-28: 20 mg via INTRAVENOUS
  Filled 2020-05-28: qty 20

## 2020-05-28 MED ORDER — BUPROPION HCL ER (SR) 150 MG PO TB12
150.0000 mg | ORAL_TABLET | Freq: Two times a day (BID) | ORAL | Status: DC
  Administered 2020-05-28 – 2020-05-29 (×3): 150 mg via ORAL
  Filled 2020-05-28 (×4): qty 1

## 2020-05-28 MED ORDER — CLONIDINE HCL 0.1 MG PO TABS
0.1000 mg | ORAL_TABLET | Freq: Two times a day (BID) | ORAL | Status: DC
  Administered 2020-05-28 – 2020-05-29 (×3): 0.1 mg via ORAL
  Filled 2020-05-28 (×3): qty 1

## 2020-05-28 MED ORDER — ACETAMINOPHEN 325 MG PO TABS: 650.0000 mg | ORAL_TABLET | Freq: Four times a day (QID) | ORAL | Status: DC | PRN

## 2020-05-28 MED ORDER — TOPIRAMATE 25 MG PO TABS
50.0000 mg | ORAL_TABLET | Freq: Every day | ORAL | Status: DC
  Administered 2020-05-28: 50 mg via ORAL
  Filled 2020-05-28 (×2): qty 2

## 2020-05-28 MED ORDER — ARIPIPRAZOLE 5 MG PO TABS
5.0000 mg | ORAL_TABLET | Freq: Every day | ORAL | Status: DC
  Administered 2020-05-28 – 2020-05-29 (×2): 5 mg via ORAL
  Filled 2020-05-28 (×2): qty 1

## 2020-05-28 NOTE — Plan of Care (Signed)
  Problem: Education: Goal: Knowledge of General Education information will improve Description: Including pain rating scale, medication(s)/side effects and non-pharmacologic comfort measures 05/28/2020 2143 by Robley Fries, RN Outcome: Progressing 05/28/2020 2142 by Robley Fries, RN Outcome: Progressing   Problem: Health Behavior/Discharge Planning: Goal: Ability to manage health-related needs will improve 05/28/2020 2143 by Robley Fries, RN Outcome: Progressing 05/28/2020 2142 by Robley Fries, RN Outcome: Progressing   Problem: Clinical Measurements: Goal: Ability to maintain clinical measurements within normal limits will improve 05/28/2020 2143 by Robley Fries, RN Outcome: Progressing 05/28/2020 2142 by Robley Fries, RN Outcome: Progressing Goal: Will remain free from infection 05/28/2020 2143 by Robley Fries, RN Outcome: Progressing 05/28/2020 2142 by Robley Fries, RN Outcome: Progressing Goal: Diagnostic test results will improve 05/28/2020 2143 by Robley Fries, RN Outcome: Progressing 05/28/2020 2142 by Robley Fries, RN Outcome: Progressing Goal: Respiratory complications will improve 05/28/2020 2143 by Robley Fries, RN Outcome: Progressing 05/28/2020 2142 by Robley Fries, RN Outcome: Progressing Goal: Cardiovascular complication will be avoided 05/28/2020 2143 by Robley Fries, RN Outcome: Progressing 05/28/2020 2142 by Robley Fries, RN Outcome: Progressing   Problem: Activity: Goal: Risk for activity intolerance will decrease 05/28/2020 2143 by Robley Fries, RN Outcome: Progressing 05/28/2020 2142 by Robley Fries, RN Outcome: Progressing   Problem: Nutrition: Goal: Adequate nutrition will be maintained 05/28/2020 2143 by Robley Fries, RN Outcome: Progressing 05/28/2020 2142 by Robley Fries, RN Outcome: Progressing   Problem: Coping: Goal: Level of anxiety will decrease 05/28/2020 2143 by Robley Fries, RN Outcome:  Progressing 05/28/2020 2142 by Robley Fries, RN Outcome: Progressing   Problem: Safety: Goal: Ability to remain free from injury will improve 05/28/2020 2143 by Robley Fries, RN Outcome: Progressing 05/28/2020 2142 by Robley Fries, RN Outcome: Progressing   Problem: Skin Integrity: Goal: Risk for impaired skin integrity will decrease 05/28/2020 2143 by Robley Fries, RN Outcome: Progressing 05/28/2020 2142 by Robley Fries, RN Outcome: Progressing   Problem: Education: Goal: Knowledge of disease or condition will improve Outcome: Progressing Goal: Understanding of medication regimen will improve Outcome: Progressing Goal: Individualized Educational Video(s) Outcome: Progressing   Problem: Activity: Goal: Ability to tolerate increased activity will improve Outcome: Progressing   Problem: Cardiac: Goal: Ability to achieve and maintain adequate cardiopulmonary perfusion will improve Outcome: Progressing   Problem: Health Behavior/Discharge Planning: Goal: Ability to safely manage health-related needs after discharge will improve Outcome: Progressing

## 2020-05-28 NOTE — ED Triage Notes (Addendum)
Pt arrive POV due to sob x1 week. Pt reports she is having sob, cp that started a week ago and she woke up this morning with worsening pain.Pt reports she also hasn't had any of her medication in about a week.. Pt reports she has h/o CHF, a-fib.

## 2020-05-28 NOTE — ED Notes (Signed)
Patient transported to X-ray 

## 2020-05-28 NOTE — ED Notes (Signed)
Lunch Tray Ordered @ 1015. 

## 2020-05-28 NOTE — ED Notes (Signed)
Pt put on purewick.

## 2020-05-28 NOTE — ED Notes (Addendum)
Pt arrived to room 18.

## 2020-05-28 NOTE — H&P (Signed)
Date: 05/28/2020               Patient Name:  Morgan Bean MRN: [id number]  DOB: [birth year] Age / Sex: 55 y.o., female   PCP: [name], MD         Medical Service: Internal Medicine Teaching Service         Attending Physician: Dr. Sid Falcon, MD    First Contact: Dr. Johnney Ou Pager: [phone]  Second Contact: Dr. Marianna Payment Pager: [phone]       After Hours (After 5p/  First Contact Pager: [phone]  weekends / holidays): Second Contact Pager: [phone]   Chief Complaint: Fatigue, chest pain, SOB  History of Present Illness: This is a 55 year old female with a history of heart failure with preserved EF, CAD, tricuspid regurgitation, pulmonary hypertension, CVA with no residual deficits, and history of pancytopenia who is presenting with a 2-3 week history of worsening shortness of breath, chest pain, and generalized fatigue. She states that she has not been taking any of her medications and has been using cocaine daily.  She states that her shortness of breath has been slowly worsening, it was coming and going however now has become persistent.  She endorses some shortness of breath with laying flat, she still is using about 2-3 pillows at night which has not changed.  She does endorse a productive cough that occurs when she lays flat, is yellow and white.  In regards to her chest pain, she states that it is located over her left side, with no radiation, started yesterday when she was sitting down, it is a stabbing pain, and associated with some nausea.  Nothing seems to improve or worsen it.  Does not change with deep breath.  She denies any recent sick contacts or recent travel.  She has been vaccinated against Covid.  Has stopped taking medications for a few weeks, did cocaine. Noted to be atrial flutter with RVR. Has been vaccinated. Needs to be rate controlled and restarting medications.   In the ED she was noted to be afebrile, tachycardic up to the 110-120s, hypertensive up to  186/155, with a normal respiratory rate. Labs were significant for a potassium 3.4, sodium 139, creatinine 1.2, glucose 104. Troponin was 23, repeat is pending. Her CBC showed a WBC of 9.3, hemoglobin 12, and platelets 200. Chest x-ray showed diffuse ill-defined airspace opacities, no pulmonary infiltrates or edema. EKG showed atrial flutter, with 2-3 AV block. She was started on a diltiazem drip and she was admitted to IMTS.  Meds:  Current Meds  Medication Sig  . amiodarone (PACERONE) 200 MG tablet Take 1 tablet (200 mg total) by mouth daily.  . ARIPiprazole (ABILIFY) 5 MG tablet Take 5 mg by mouth daily.  [name] atorvastatin (LIPITOR) 40 MG tablet Take 1 tablet (40 mg total) by mouth at bedtime.  [name] buPROPion (WELLBUTRIN SR) 150 MG 12 hr tablet Take 1 tablet (150 mg total) by mouth 2 (two) times daily.  . citalopram (CELEXA) 40 MG tablet Take 1 tablet (40 mg total) by mouth daily.  . cloNIDine (CATAPRES) 0.1 MG tablet Take 1 tablet (0.1 mg total) by mouth 2 (two) times daily.  [name] diltiazem (CARDIZEM CD) 180 MG 24 hr capsule Take 1 capsule (180 mg total) by mouth daily.  . ferrous sulfate 325 (65 FE) MG EC tablet Take 1 tablet (325 mg total) by mouth 3 (three) times daily with meals.  . furosemide (LASIX) 40 MG tablet TAKE  1 TABLET (40 MG TOTAL) BY MOUTH 2 (TWO) TIMES DAILY.  . hydrOXYzine (ATARAX/VISTARIL) 50 MG tablet Take 50 mg by mouth 2 (two) times daily as needed for anxiety.  [name] lisinopril (ZESTRIL) 10 MG tablet TAKE 1 TABLET (10 MG TOTAL) BY MOUTH DAILY.  . Multiple Vitamin (MULTIVITAMIN) tablet Take 1 tablet by mouth daily.  . nicotine (NICODERM CQ) 21 mg/24hr patch Place 1 patch (21 mg total) onto the skin daily. USE FOR 6 WEEKS AND THEN START THE 14 MG (Patient taking differently: Place 21 mg onto the skin daily.)  . topiramate (TOPAMAX) 25 MG tablet Take 2 tablets (50 mg total) by mouth at bedtime.  . traZODone (DESYREL) 150 MG tablet Take 1 tablet (150 mg total) by mouth at bedtime.  Alveda Reasons 20 MG TABS tablet TAKE 1 TABLET (20 MG TOTAL) BY MOUTH DAILY WITH SUPPER. (Patient taking differently: Take 20 mg by mouth daily with supper.)  . [DISCONTINUED] Vitamins/Minerals TABS Take by mouth.     Allergies: Allergies as of 05/28/2020 - Review Complete 05/28/2020  Allergen Reaction Noted  . Ciprofloxacin Itching 12/15/2010   Past Medical History:  Diagnosis Date  . Anemia of other chronic disease 11/13/2013  . Anginal pain ([place])    none in past year  . Anxiety   . Asthma    patient denies  . Candidiasis 06/23/2013  . Chronic diastolic CHF (congestive heart failure) ([place])   . Constipation   . Coronary artery disease    Lexiscan myoview (10/12) with significant ST depression upon Lexiscan injection but no ischemia or infarction on perfusion images. Left heart cath (10/12): 30% mid LAD, 30% ostial D1, 50% ostial D2.    [name] Cough 11/13/2013  . Depression   . Headache 02/22/2014  . Hx of cardiovascular stress test    Lexiscan Myoview (2/16): Breast attenuation artifact, no ischemia, EF 64%; low risk  . Hyperlipemia   . Hypertension   . Iron deficiency    hx of  . Leukocytoclastic vasculitis ([place])    Rash across lower body, occurred in 9/11, diagnosed by skin biopsy. ANA positive. Thought to be secondary to cocaine use.  . Leukopenia 03/21/2013  . Lymphadenopathy 03/21/2013  . Lymphadenopathy 01/30/2014  . Mental disorder   . Pancytopenia ([place])   . Pancytopenia, acquired ([place]) 07/16/2015  . Polysubstance abuse ([place])    Prior cocaine  . Pulmonary HTN ([place])    Echo (9/11) with EF 65%, mild LVH, mild AI, mild MR, moderate to possibly severe TR with PA systolic pressure 52 mmHg. Echo (10/12): severe LV hypertrophy, EF 60-65%, mild MR, mild AI, moderate to severe tricuspid regurgitation, PA systolic pressure 38 mmHg.  Echo 4/14: moderate LVH, EF 65%, normal wall motion, diastolic dysfunction, mild AI, mild MR  . Shortness of breath    a. PFTs 5/14:  FEV1/FVC 99% predicted; FVC  60% predicted; DLCO mildly reduced, mild restriction, air trapping.;  b.  seen by pulmo (Dr. [name]) 5/14: mainly upper airway symptoms - ACE d/c'd (sx's better)  . Stroke ([place]) 2010ish  . Syphilis 04/25/2013  . Tobacco abuse   . Tricuspid regurgitation   . Unspecified deficiency anemia 03/29/2013    Family History: Mother had diabetes and breast cancer.  Father had diabetes.  Social History: Patient states that she uses cocaine about every 3 months, she has been using it since the age of 14.  She will go on "binges".  She has tried quitting in the past but was unsuccessful.  She  started smoking about 1 g/day 2 to 3 weeks ago, and last use was last night.  She reports smoking about 4 to 5 cigarettes/day.  Denies any alcohol use.  Currently is living with her sister and her 2 brothers.  She was supposed to start work for a work at home job tomorrow.  Review of Systems: A complete ROS was negative except as per HPI.   Physical Exam: Blood pressure (!) 172/120, pulse (!) 142, temperature 98 F (36.7 C), temperature source Oral, resp. rate 15, height 5\' 6"  (1.676 m), weight 72.6 kg, last menstrual period 05/10/2013, SpO2 99 %. Physical Exam Constitutional:      Appearance: She is well-developed.  HENT:     Head: Normocephalic and atraumatic.  Neck:     Vascular: Hepatojugular reflux present.  Cardiovascular:     Rate and Rhythm: Normal rate. Rhythm irregular.     Heart sounds: Normal heart sounds. No murmur heard.   Pulmonary:     Effort: Tachypnea present. No respiratory distress.     Breath sounds: Examination of the right-lower field reveals rales. Examination of the left-lower field reveals rales. Rales present. No wheezing or rhonchi.  Chest:     Chest wall: Tenderness (Mild TTP over left chest) present.  Abdominal:     General: Bowel sounds are normal.     Palpations: Abdomen is soft.     Tenderness: There is no abdominal tenderness.  Musculoskeletal:        General: Normal  range of motion.     Cervical back: Normal range of motion and neck supple.     Right lower leg: No edema.     Left lower leg: No edema.  Lymphadenopathy:     Cervical: No cervical adenopathy.  Skin:    General: Skin is warm and dry.     Capillary Refill: Capillary refill takes less than 2 seconds.  Neurological:     General: No focal deficit present.     Mental Status: She is alert and oriented to person, place, and time.  Psychiatric:        Mood and Affect: Mood normal.     Comments: Intermittently tearful      EKG: personally reviewed my interpretation is tachycardic, heart rate around 150, regular appearing, appears consistent with atrial flutter with 2-3 AV block  CXR: personally reviewed my interpretation is mildly rotated, mild cardiomegaly, no pleural effusion, diffuse patchy opacities, no obvious infiltrates  Assessment & Plan by Problem: Active Problems:   Atrial flutter with rapid ventricular response ([name])  This is a 56 year old female with a history of chronic diastolic heart failure, mild pulmonary hypertension, prior CVA, CAD, atrial flutter s/p ablation, COPD, OSA not on CPAP, polysubstance abuse, depression, and anxiety who presented with a 2 to 3-week history of worsening shortness of breath, and chest pain.  Atrial flutter with RVR: Patient is supposed to be on amiodarone, diltiazem, and Xarelto for this, she has not been taking her medications for the past 2 to 3 weeks.  She also has been endorsing cocaine use which may have worsened her symptoms.  She does appear mildly volume overloaded which may have been a result of the atrial flutter with RVR.  She does endorse some chest pain, that is nonexertional, no radiation, and associated with nausea.  Nothing seems to improve or worsen the pain.  EKG was consistent with atrial flutter.  Troponins were 23 > 19.  Will repeat EKG when heart rate is better controlled.  -  Patient is currently on diltiazem drip, is well  controlled on 5 mcg/hr -Resume home diltiazem 120 mg daily, can transition off drip as tolerated -Resume home amiodarone -Resume home Xarelto -Repeat EKG later today  Chronic diastolic heart failure: Moderate to severe mitral regurgitation: Patient reports orthopnea, worsening shortness of breath, and PND.  She does have elevated JVD and bibasilar crackles on exam, with no lower extremity swelling. Chest x-ray showed pulmonary edema.  On Lasix 40 mg twice daily home which she has not been taking.  She does appear volume overloaded on exam. This could be secondary to her atrial flutter, medication noncompliance, or cocaine use.  -Check BNP -Lasix 40 mg once -Daily ins and outs -Strict I's and O's  Hypertension: Patient is supposed to be on clonidine, diltiazem, and lisinopril at home.  She states that she is not taking these medications.  Blood pressure today elevated to 204/135.  Has been better.  -Resume home medications  Prior CVA: Hyperlipidemia: Resume home atorvastatin.  Resume home Xarelto.  Cocaine abuse: Patient reports that she goes on cocaine binges, states that they occur every 3 months however this episode was longer.  She has been doing approximately 1 g of cocaine per day for the past 2 to 3 weeks, her last use was yesterday.  She does report interest in cessation. -Encouraged cessation -Consulted social work to provide resources  Tobacco use: Patient reports smoking approximately 3 to 4 cigarettes/day. -Nicotine patch  Depression: Patient is on trazodone, Celexa, and bupropion at home.  -Resume home medications  Primary stabbing headache: On topamax at home.  Resume this.  Dispo: Admit patient to Inpatient with expected length of stay greater than 2 midnights.  Signed: Asencion Noble, MD 05/28/2020, 11:37 AM  Pager: [phone] After 5pm on weekdays and 1pm on weekends: On Call pager: [phone]

## 2020-05-28 NOTE — ED Provider Notes (Signed)
[facility] EMERGENCY DEPARTMENT Provider Note   CSN: [id number] Arrival date & time: 05/28/20  0719     History Chief Complaint  Patient presents with  . Chest Pain  . Shortness of Breath    Morgan Bean is a 55 y.o. female.  HPI Patient presents with chest pain shortness of breath.  Began a week or 2 ago.  Has some dull chest pain in the mid chest.  Also more fatigued.  Has been off her medications for at least a couple weeks now.  States she went on sort of a bender has been using drugs and not taking her medicine.  History of CHF.  History of atrial flutter.  States she been off all medicines.  Had some mild swelling in her legs.  No fevers.  States she has had her COVID vaccines and not knowingly been around anyone sick    Past Medical History:  Diagnosis Date  . Anemia of other chronic disease 11/13/2013  . Anginal pain ([place])    none in past year  . Anxiety   . Asthma    patient denies  . Candidiasis 06/23/2013  . Chronic diastolic CHF (congestive heart failure) ([place])   . Constipation   . Coronary artery disease    Lexiscan myoview (10/12) with significant ST depression upon Lexiscan injection but no ischemia or infarction on perfusion images. Left heart cath (10/12): 30% mid LAD, 30% ostial D1, 50% ostial D2.    [name] Cough 11/13/2013  . Depression   . Headache 02/22/2014  . Hx of cardiovascular stress test    Lexiscan Myoview (2/16): Breast attenuation artifact, no ischemia, EF 64%; low risk  . Hyperlipemia   . Hypertension   . Iron deficiency    hx of  . Leukocytoclastic vasculitis ([place])    Rash across lower body, occurred in 9/11, diagnosed by skin biopsy. ANA positive. Thought to be secondary to cocaine use.  . Leukopenia 03/21/2013  . Lymphadenopathy 03/21/2013  . Lymphadenopathy 01/30/2014  . Mental disorder   . Pancytopenia ([place])   . Pancytopenia, acquired ([place]) 07/16/2015  . Polysubstance abuse ([place])    Prior cocaine  . Pulmonary HTN ([place])     Echo (9/11) with EF 65%, mild LVH, mild AI, mild MR, moderate to possibly severe TR with PA systolic pressure 52 mmHg. Echo (10/12): severe LV hypertrophy, EF 60-65%, mild MR, mild AI, moderate to severe tricuspid regurgitation, PA systolic pressure 38 mmHg.  Echo 4/14: moderate LVH, EF 65%, normal wall motion, diastolic dysfunction, mild AI, mild MR  . Shortness of breath    a. PFTs 5/14:  FEV1/FVC 99% predicted; FVC 60% predicted; DLCO mildly reduced, mild restriction, air trapping.;  b.  seen by pulmo (Dr. [name]) 5/14: mainly upper airway symptoms - ACE d/c'd (sx's better)  . Stroke ([place]) 2010ish  . Syphilis 04/25/2013  . Tobacco abuse   . Tricuspid regurgitation   . Unspecified deficiency anemia 03/29/2013    Patient Active Problem List   Diagnosis Date Noted  . Autoimmune disease ([place]) 12/27/2018  . Laryngitis, chronic 12/26/2018  . Febrile neutropenia ([place]) 12/16/2018  . Chronic anticoagulation 10/14/2018  . CRI (chronic renal insufficiency), stage 3 (moderate) ([place]) 10/14/2018  . Upper airway cough syndrome 06/30/2018  . Tobacco dependence in remission 04/11/2018  . Substance abuse in remission ([place]) 04/11/2018  . Depression, recurrent ([place]) 04/11/2018  . PAF (paroxysmal atrial fibrillation) ([place]) 03/10/2018  . Acute pulmonary edema ([name])   . Atrial fibrillation with RVR (  [place])   . Nonrheumatic mitral valve regurgitation   . Acute on chronic diastolic CHF (congestive heart failure) ([place]) 03/08/2018  . Hypertensive urgency 03/08/2018  . Acute respiratory failure with hypoxia ([place]) 03/08/2018  . Unspecified atrial flutter ([place]) 04/25/2016  . Tobacco use 11/30/2015  . Pancytopenia, acquired ([place]) 07/16/2015  . Head and neck lymphadenopathy 07/16/2015  . 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 Absolute anemia 02/25/2015  . Preventive measure 12/27/2014  . 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 History of stroke 12/26/2014  . Pharyngeal  mass 06/01/2014  . Headache 02/22/2014  . Dental caries 01/09/2014  . Cough 11/13/2013  . Cigarette smoker 11/13/2013  . Swelling of gums 11/13/2013  . Fatigue 11/10/2013  . Intracranial atherosclerosis 11/10/2013  . Hyperreflexia 10/04/2013  . TIA (transient ischemic attack) 10/04/2013  . Fibroid uterus 09/14/2012  . Perimenopause 09/14/2012  . Chronic cough 08/12/2012  . DOE (dyspnea on exertion) 08/12/2012  . History of CVA (cerebrovascular accident) 08/09/2012  . Chest pain, mid sternal 04/07/2012  . Pulmonary hypertension ([place]) 04/06/2012  . Polysubstance abuse ([place]) 04/06/2012  . History of cocaine abuse ([place]) 11/16/2011  . Major depressive disorder, recurrent episode with psychotic features. 11/15/2011    Class: Acute  . Chronic diastolic heart failure ([place]) 02/24/2011  . CAD (coronary artery disease) 02/24/2011  . Hyperlipidemia 01/06/2011  . Essential hypertension 12/15/2010  . Exertional dyspnea 12/15/2010  . Leukocytoclastic vasculitis ([place]) 03/24/2007    Past Surgical History:  Procedure Laterality Date  . ANKLE SURGERY     right  . BONE MARROW ASPIRATION    . CARDIAC CATHETERIZATION    . CARDIAC CATHETERIZATION N/A 04/23/2015   Procedure: Right Heart Cath;  Surgeon: [name], MD;  Location: [facility];  Service: Cardiovascular;  Laterality: N/A;  . CARDIOVERSION N/A 03/11/2018   Procedure: CARDIOVERSION;  Surgeon: [name], MD;  Location: [facility];  Service: Cardiovascular;  Laterality: N/A;  . I & D EXTREMITY  08/09/2011   Procedure: IRRIGATION AND DEBRIDEMENT EXTREMITY;  Surgeon: [name], MD;  Location: [facility];  Service: General;  Laterality: Left;  i & D Left axilla abscess  . LYMPH NODE BIOPSY N/A 04/05/2013   Procedure: EXCISIONAL BIOPSY RIGHT SUBMANDIBULAR NODE, NASAL ENDOSCOPIC WITH BIOPSY NASAL PHARYNX;  Surgeon: [name], MD;  Location: [facility];  Service: ENT;  Laterality: N/A;  . MULTIPLE EXTRACTIONS WITH ALVEOLOPLASTY  N/A 03/03/2019   Procedure: MULTIPLE EXTRACTION WITH ALVEOLOPLASTY;  Surgeon: [name], DDS;  Location: [facility];  Service: Oral Surgery;  Laterality: N/A;  . TEE WITHOUT CARDIOVERSION N/A 03/11/2018   Procedure: TRANSESOPHAGEAL ECHOCARDIOGRAM (TEE);  Surgeon: [name], MD;  Location: [facility];  Service: Cardiovascular;  Laterality: N/A;     OB History    Gravida  4   Para  2   Term  2   Preterm      AB  2   Living  2     SAB      IAB  2   Ectopic      Multiple      Live Births              Family History  Problem Relation Age of Onset  . Coronary artery disease Mother   . Diabetes Mother   . Diabetes Father   . Hypertension Father   . Coronary artery disease Father   . Heart attack Father   .  Breast cancer Maternal Grandmother   . Diabetes Maternal Grandmother   . Heart attack Maternal Grandmother   . Stroke Maternal Grandmother   . Asthma Grandson     Social History   Tobacco Use  . Smoking status: Current Every Day Smoker    Packs/day: 0.50    Years: 30.00    Pack years: 15.00    Types: Cigarettes  . Smokeless tobacco: Never Used  Vaping Use  . Vaping Use: Never used  Substance Use Topics  . Alcohol use: No    Alcohol/week: 0.0 standard drinks    Comment: used in the past  . Drug use: No    Frequency: 1.0 times per week    Types: "Crack" cocaine    Comment: none since 02/04/14    Home Medications Prior to Admission medications   Medication Sig Start Date End Date Taking? Authorizing Provider  amiodarone (PACERONE) 200 MG tablet Take 1 tablet (200 mg total) by mouth daily. 06/29/19   Erlene Quan, PA-C  amoxicillin (AMOXIL) 500 MG capsule Take 1 capsule (500 mg total) by mouth 3 (three) times daily. 03/03/19   [name], DMD  ARIPiprazole (ABILIFY) 5 MG tablet Take 5 mg by mouth daily.    [provider]  atorvastatin (LIPITOR) 40 MG tablet Take 1 tablet (40 mg total) by mouth at bedtime. 12/28/18   [name],  MD  buPROPion (WELLBUTRIN SR) 150 MG 12 hr tablet Take 1 tablet (150 mg total) by mouth 2 (two) times daily. 12/28/18   [name], MD  citalopram (CELEXA) 40 MG tablet Take 1 tablet (40 mg total) by mouth daily. 12/28/18   [name], MD  cloNIDine (CATAPRES) 0.1 MG tablet Take 1 tablet (0.1 mg total) by mouth 2 (two) times daily. 12/28/18   [name], MD  diltiazem (CARDIZEM CD) 180 MG 24 hr capsule Take 1 capsule (180 mg total) by mouth daily. 12/28/18   [name], MD  ferrous sulfate 325 (65 FE) MG EC tablet Take 1 tablet (325 mg total) by mouth 3 (three) times daily with meals. 12/28/18   [name], MD  furosemide (LASIX) 40 MG tablet TAKE 1 TABLET (40 MG TOTAL) BY MOUTH 2 (TWO) TIMES DAILY. 04/19/19   [name], MD  hydrOXYzine (ATARAX/VISTARIL) 50 MG tablet Take 50 mg by mouth 4 (four) times daily as needed for anxiety.     [provider]  lisinopril (ZESTRIL) 10 MG tablet TAKE 1 TABLET (10 MG TOTAL) BY MOUTH DAILY. 04/19/19 07/18/19  [name], MD  nicotine (NICODERM CQ) 21 mg/24hr patch Place 1 patch (21 mg total) onto the skin daily. USE FOR 6 WEEKS AND THEN START THE 14 MG 01/06/19   [name], MD  oxyCODONE-acetaminophen (PERCOCET) 5-325 MG tablet Take 1 tablet by mouth every 4 (four) hours as needed. 03/03/19   [name], DMD  topiramate (TOPAMAX) 25 MG tablet Take 2 tablets (50 mg total) by mouth at bedtime. 12/28/18   [name], MD  traZODone (DESYREL) 150 MG tablet Take 1 tablet (150 mg total) by mouth at bedtime. 12/28/18   [name], MD  Vitamins/Minerals TABS Take by mouth. 11/18/11   [provider]  XARELTO 20 MG TABS tablet TAKE 1 TABLET (20 MG TOTAL) BY MOUTH DAILY WITH SUPPER. 04/24/19   [name], MD    Allergies    Ciprofloxacin  Review of Systems   Review of Systems  Constitutional: Positive for fatigue. Negative for appetite change.  HENT: Negative for  congestion.   Respiratory:  Positive for shortness of breath.   Cardiovascular: Positive for chest pain and palpitations. Negative for leg swelling.  Musculoskeletal: Negative for back pain.  Skin: Negative for rash.  Neurological: Negative for weakness.  Psychiatric/Behavioral: Negative for confusion.    Physical Exam Updated Vital Signs BP (!) 186/153   Pulse (!) 112   Temp 98 F (36.7 C) (Oral)   Resp (!) 22   Ht 5\' 6"  (1.676 m)   Wt 72.6 kg   LMP 05/10/2013   SpO2 98%   BMI 25.82 kg/m   Physical Exam Vitals and nursing note reviewed.  HENT:     Head: Normocephalic.  Eyes:     Extraocular Movements: Extraocular movements intact.  Cardiovascular:     Comments: Irregular tachycardia. Pulmonary:     Comments: Mildly harsh breath sounds without focal rales or rhonchi. Musculoskeletal:     Cervical back: Neck supple.     Comments: Trace edema bilaterally.  Skin:    Capillary Refill: Capillary refill takes less than 2 seconds.  Neurological:     Mental Status: She is alert and oriented to person, place, and time.     ED Results / Procedures / Treatments   Labs (all labs ordered are listed, but only abnormal results are displayed) Labs Reviewed  BASIC METABOLIC PANEL - Abnormal; Notable for the following components:      Result Value   Potassium 3.4 (*)    CO2 21 (*)    Glucose, Bld 104 (*)    Creatinine, Ser 1.20 (*)    GFR, Estimated 53 (*)    All other components within normal limits  TROPONIN I (HIGH SENSITIVITY) - Abnormal; Notable for the following components:   Troponin I (High Sensitivity) 23 (*)    All other components within normal limits  RESP PANEL BY RT-PCR (FLU A&B, COVID) ARPGX2  CBC  BRAIN NATRIURETIC PEPTIDE  ETHANOL  HEPATIC FUNCTION PANEL  RAPID URINE DRUG SCREEN, HOSP PERFORMED  I-STAT BETA HCG BLOOD, ED ([facility], [facility], [facility] ONLY)    EKG EKG Interpretation  Date/Time:  Tuesday May 28 2020 07:23:17 EST Ventricular Rate:  147 PR Interval:    QRS Duration: 86 QT  Interval:  308 QTC Calculation: 482 R Axis:   31 Text Interpretation: Atrial flutter with variable A-V block Possible Anterior infarct , age undetermined Abnormal ECG Confirmed by Davonna Belling [phone]) on 05/28/2020 7:35:51 AM   Radiology DG Chest 2 View  Result Date: 05/28/2020 CLINICAL DATA:  Chest pain and shortness of breath EXAM: CHEST - 2 VIEW COMPARISON:  December 16, 2018 FINDINGS: There is ill-defined opacity in each lower lung region, somewhat more on the right than on the left. No consolidation. There is slight scarring in right mid lung. Heart is mildly enlarged with pulmonary vascularity normal, stable. No adenopathy. There is aortic atherosclerosis. No bone lesions. IMPRESSION: Ill-defined airspace opacity in the bases, somewhat more notable on the right than the left. Suspect underlying pneumonia, potentially of atypical organism etiology. Check of COVID-19 status in this regard may be advisable. Mild scarring right mid lung medially. Stable cardiac prominence. No adenopathy. Aortic Atherosclerosis (ICD10-I70.0). Electronically Signed   By: Lowella Grip III M.D.   On: 05/28/2020 08:15    Procedures Procedures   Medications Ordered in ED Medications  diltiazem (CARDIZEM) 1 mg/mL load via infusion 20 mg (has no administration in time range)    And  diltiazem (CARDIZEM) 125 mg in dextrose 5% 125 mL (1 mg/mL)  infusion (has no administration in time range)    ED Course  I have reviewed the triage vital signs and the nursing notes.  Pertinent labs & imaging results that were available during my care of the patient were reviewed by me and considered in my medical decision making (see chart for details).    MDM Rules/Calculators/A&P                          Patient presents with shortness of breath and chest pain.  Has been going for the last couple weeks.  Has been noncompliant with her medications.  States she went on a "bender" recently.  Has been using cocaine.  Has  been taking her medicines for last couple weeks also.  Found to be in atrial flutter which has been somewhat persistent for the patient.  However she is not in RVR.  Not anticoagulated.  Also rather severely hypertensive blood pressures at least 356 systolic.  Chest x-ray shows abnormalities potentially edema but also could be a typical infection.  States that she has had her COVID vaccines.  Has had some chest pain at time.  EKG reassuring for overall ischemia.  I think likely secondary demand with a mildly elevated troponin.  Also could be secondary to cocaine use.  Cardizem drip started.  Will require admission to the hospital.  Patient sees [facility] where admitted to unassigned medicine which appears to be the internal medicine residents.  Chadsvasc of atleast 5.  CRITICAL CARE Performed by: Davonna Belling Total critical care time: 30 minutes Critical care time was exclusive of separately billable procedures and treating other patients. Critical care was necessary to treat or prevent imminent or life-threatening deterioration. Critical care was time spent personally by me on the following activities: development of treatment plan with patient and/or surrogate as well as nursing, discussions with consultants, evaluation of patient's response to treatment, examination of patient, obtaining history from patient or surrogate, ordering and performing treatments and interventions, ordering and review of laboratory studies, ordering and review of radiographic studies, pulse oximetry and re-evaluation of patient's condition.  Final Clinical Impression(s) / ED Diagnoses Final diagnoses:  Atrial flutter with rapid ventricular response ([place])  Noncompliance with medications  Substance abuse [facility])    Rx / DC Orders ED Discharge Orders    None       Davonna Belling, MD 05/28/20 1614

## 2020-05-29 ENCOUNTER — Other Ambulatory Visit: Payer: Self-pay | Admitting: Internal Medicine

## 2020-05-29 DIAGNOSIS — I4892 Unspecified atrial flutter: Secondary | ICD-10-CM

## 2020-05-29 DIAGNOSIS — F1910 Other psychoactive substance abuse, uncomplicated: Secondary | ICD-10-CM

## 2020-05-29 LAB — BASIC METABOLIC PANEL
Anion gap: 9 (ref 5–15)
BUN: 22 mg/dL — ABNORMAL HIGH (ref 6–20)
CO2: 21 mmol/L — ABNORMAL LOW (ref 22–32)
Calcium: 8.7 mg/dL — ABNORMAL LOW (ref 8.9–10.3)
Chloride: 108 mmol/L (ref 98–111)
Creatinine, Ser: 1.16 mg/dL — ABNORMAL HIGH (ref 0.44–1.00)
GFR, Estimated: 55 mL/min — ABNORMAL LOW (ref >60)
Glucose, Bld: 91 mg/dL (ref 70–99)
Potassium: 3.2 mmol/L — ABNORMAL LOW (ref 3.5–5.1)
Sodium: 138 mmol/L (ref 135–145)

## 2020-05-29 LAB — CBC
HCT: 35.7 % — ABNORMAL LOW (ref 36.0–46.0)
Hemoglobin: 11.6 g/dL — ABNORMAL LOW (ref 12.0–15.0)
MCH: 26.1 pg (ref 26.0–34.0)
MCHC: 32.5 g/dL (ref 30.0–36.0)
MCV: 80.2 fL (ref 80.0–100.0)
Platelets: 204 10*3/uL (ref 150–400)
RBC: 4.45 MIL/uL (ref 3.87–5.11)
RDW: 14.5 % (ref 11.5–15.5)
WBC: 7.7 10*3/uL (ref 4.0–10.5)
nRBC: 0 % (ref 0.0–0.2)

## 2020-05-29 LAB — MAGNESIUM: Magnesium: 2.2 mg/dL (ref 1.7–2.4)

## 2020-05-29 LAB — PHOSPHORUS: Phosphorus: 2.9 mg/dL (ref 2.5–4.6)

## 2020-05-29 MED ORDER — POTASSIUM CHLORIDE CRYS ER 20 MEQ PO TBCR: 20.0000 meq | EXTENDED_RELEASE_TABLET | Freq: Every day | ORAL | 0 refills | Status: DC

## 2020-05-29 MED ORDER — FUROSEMIDE 20 MG PO TABS: 20.0000 mg | ORAL_TABLET | Freq: Two times a day (BID) | ORAL | Status: DC

## 2020-05-29 MED ORDER — POTASSIUM CHLORIDE CRYS ER 20 MEQ PO TBCR
40.0000 meq | EXTENDED_RELEASE_TABLET | Freq: Two times a day (BID) | ORAL | Status: DC
  Administered 2020-05-29: 40 meq via ORAL
  Filled 2020-05-29: qty 2

## 2020-05-29 NOTE — Progress Notes (Signed)
CSW received consult for substance use resources for patient. CSW spoke with patient at bedside. CSW offered patient Outpatient substance use treatment services resources. Patient accepted.  CSW will continue to follow.

## 2020-05-29 NOTE — Discharge Summary (Signed)
Name: Morgan Bean MRN: [id number] DOB: 1965-12-17 55 y.o. PCP: [name], MD  Date of Admission: 05/28/2020  7:20 AM Date of Discharge: 05/29/2020 Attending Physician: Dr. Daryll Drown  Discharge Diagnosis: Active Problems:   Atrial flutter with rapid ventricular response [facility])    Discharge Medications: Allergies as of 05/29/2020      Reactions   Ciprofloxacin Itching      Medication List    STOP taking these medications   multivitamin tablet   nicotine 21 mg/24hr patch Commonly known as: Nicoderm CQ   oxyCODONE-acetaminophen 5-325 MG tablet Commonly known as: Percocet     TAKE these medications   amiodarone 200 MG tablet Commonly known as: PACERONE Take 1 tablet (200 mg total) by mouth daily.   ARIPiprazole 5 MG tablet Commonly known as: ABILIFY Take 5 mg by mouth daily.   atorvastatin 40 MG tablet Commonly known as: LIPITOR Take 1 tablet (40 mg total) by mouth at bedtime.   buPROPion 150 MG 12 hr tablet Commonly known as: WELLBUTRIN SR Take 1 tablet (150 mg total) by mouth 2 (two) times daily.   citalopram 40 MG tablet Commonly known as: CELEXA Take 1 tablet (40 mg total) by mouth daily.   cloNIDine 0.1 MG tablet Commonly known as: CATAPRES Take 1 tablet (0.1 mg total) by mouth 2 (two) times daily.   diltiazem 180 MG 24 hr capsule Commonly known as: CARDIZEM CD Take 1 capsule (180 mg total) by mouth daily.   ferrous sulfate 325 (65 FE) MG EC tablet Take 1 tablet (325 mg total) by mouth 3 (three) times daily with meals.   furosemide 40 MG tablet Commonly known as: LASIX TAKE 1 TABLET (40 MG TOTAL) BY MOUTH 2 (TWO) TIMES DAILY.   hydrOXYzine 50 MG tablet Commonly known as: ATARAX/VISTARIL Take 50 mg by mouth 2 (two) times daily as needed for anxiety.   lisinopril 10 MG tablet Commonly known as: ZESTRIL TAKE 1 TABLET (10 MG TOTAL) BY MOUTH DAILY.   potassium chloride SA 20 MEQ tablet Commonly known as: KLOR-CON Take 1 tablet (20 mEq total) by  mouth daily.   topiramate 25 MG tablet Commonly known as: TOPAMAX Take 2 tablets (50 mg total) by mouth at bedtime.   traZODone 150 MG tablet Commonly known as: DESYREL Take 1 tablet (150 mg total) by mouth at bedtime.   Xarelto 20 MG Tabs tablet Generic drug: rivaroxaban TAKE 1 TABLET (20 MG TOTAL) BY MOUTH DAILY WITH SUPPER. What changed: how much to take       Disposition and follow-up:   Ms.Morgan Bean was discharged from [facility] in Stable condition.  At the hospital follow up visit please address:  1.  Follow-up:  a.  Atrial flutter with RVR-no longer RVR.  Still a flutter.  Continued home meds    b.  Chronic HFpEF-follow-up with cardiology outpatient, continue home meds.   c.  Substance abuse-patient to follow-up with outpatient rehab services   d.  Hypokalemia-continued on potassium supplements  2.  Labs / imaging needed at time of follow-up: BMP  3.  Pending labs/ test needing follow-up: None  4.  Medication Changes  Started: Potassium chloride 20 mEq twice daily  Stopped: None  Changed: Lasix changed from 40 mg to 20 mg twice daily  Abx -none   Follow-up Appointments:  Follow-up Information    [name], MD Follow up.   Specialty: Family Medicine Why: Please follow up with your PCP within 1 week. Contact information: [address] [address] [phone]        [name], MD. Schedule an appointment as soon as possible for a visit.   Specialty: Cardiology Contact information: [address] [place] [address] [phone]               Hospital Course by problem list:   A. fib with RVR Patient presented to the ED with 2 to 3-week history of worsening shortness of breath and chest pain.  She was found to be in atrial flutter and RVR.  She denied taking her home medications of amiodarone, diltiazem, Xarelto.  These medications were restarted during her admission and she  had improvement of her heart rate.  She remained in a flutter at discharge.  Recommend she follow-up with her cardiologist Dr. Oval Linsey.  Plan to reach out to cards master to have her schedule appointment for the patient.  Does not appear she has seen Dr. Oval Linsey since 2020.  Chronic diastolic heart failure Moderate to severe mitral regurgitation Patient endorsed orthopnea and worsening shortness of breath as well as PND.  On admission she had JVD and bibasilar crackles without lower extremity swelling.  Her chest x-ray was with evidence of pulmonary edema.  She is supposed to be on Lasix 40 mg twice daily but she has not been taking this.  She was given 40 mg IV Lasix during her admission.  On repeat examination today she had no JVD and her lungs were clear to auscultation.  Will discharge home with Lasix 20 mg twice daily and have her follow-up with Dr. Oval Linsey.  We will also discharge her home with potassium supplementation.  Patient to have appointment scheduled with Dr. Oval Linsey. Patient will also need to follow-up with primary care provider.  Cocaine abuse Patient with cocaine use, states she goes on binges every 3 months.  She has been doing 1 g of cocaine per day for the past 2 to 3 weeks last reported use was 2 days prior to arrival.  She does have interest in cessation and met with T[facility] during her admission to discuss rehabilitation options.  Electrolyte abnormalities Patient with hypokalemia during her admission.  Was supplemented and discharged home with potassium supplementation.  She is also continued on Lasix 20 mg.  Will need to follow-up with primary care provider or cardiology for repeat BMP on outpatient basis.  Discharge Subjective: She says her breathing has improved although lower extremity remains about the same, bilaterally. She says she takes her medications irregularly at home and feels overwhelmed by the amount of medications she is on.   Discharge Exam:   BP (!) 145/96  (BP Location: Right Arm)   Pulse 76   Temp 98.5 F (36.9 C) (Oral)   Resp 20   Ht $R'5\' 6"'EH$  (1.676 m)   Wt 72.4 kg   LMP 05/10/2013   SpO2 96%   BMI 25.76 kg/m  Constitutional: well-appearing, no acute distress HENT: normocephalic atraumatic, mucous membranes moist Eyes: conjunctiva non-erythematous Neck: supple Cardiovascular: Normal rate, regular rhythm.  Systolic murmur 2/6 best heard over left fifth intercostal space. Pulmonary/Chest: normal work of breathing on room air, lungs clear to auscultation bilaterally Abdominal: soft, non-tender, non-distended MSK: normal bulk and tone Neurological: alert & oriented x 3, 5/5 strength in bilateral upper and lower extremities, normal gait Skin: warm and dry, trace lower extremity edema Psych: Normal mood  Pertinent Labs, Studies, and Procedures:  CBC Latest Ref Rng & Units 05/29/2020 05/28/2020 03/01/2019  WBC 4.0 - 10.5 K/uL 7.7 9.3 4.3  Hemoglobin 12.0 - 15.0 g/dL 11.6(L) 12.3 11.3(L)  Hematocrit 36.0 - 46.0 % 35.7(L) 38.1 37.1  Platelets 150 - 400 K/uL 204 200 265    CMP Latest Ref Rng & Units 05/29/2020 05/28/2020 03/01/2019  Glucose 70 - 99 mg/dL 91 104(H) 93  BUN 6 - 20 mg/dL 22(H) 20 10  Creatinine 0.44 - 1.00 mg/dL 1.16(H) 1.20(H) 1.25(H)  Sodium 135 - 145 mmol/L 138 139 141  Potassium 3.5 - 5.1 mmol/L 3.2(L) 3.4(L) 4.1  Chloride 98 - 111 mmol/L 108 110 108  CO2 22 - 32 mmol/L 21(L) 21(L) 24  Calcium 8.9 - 10.3 mg/dL 8.7(L) 9.0 9.0  Total Protein 6.5 - 8.1 g/dL - 6.8 7.8  Total Bilirubin 0.3 - 1.2 mg/dL - 1.4(H) <0.1(L)  Alkaline Phos 38 - 126 U/L - 95 61  AST 15 - 41 U/L - 68(H) 14(L)  ALT 0 - 44 U/L - 53(H) 13    DG Chest 2 View  Result Date: 05/28/2020 CLINICAL DATA:  Chest pain and shortness of breath EXAM: CHEST - 2 VIEW COMPARISON:  December 16, 2018 FINDINGS: There is ill-defined opacity in each lower lung region, somewhat more on the right than on the left. No consolidation. There is slight scarring in right mid lung.  Heart is mildly enlarged with pulmonary vascularity normal, stable. No adenopathy. There is aortic atherosclerosis. No bone lesions. IMPRESSION: Ill-defined airspace opacity in the bases, somewhat more notable on the right than the left. Suspect underlying pneumonia, potentially of atypical organism etiology. Check of COVID-19 status in this regard may be advisable. Mild scarring right mid lung medially. Stable cardiac prominence. No adenopathy. Aortic Atherosclerosis (ICD10-I70.0). Electronically Signed   By: Lowella Grip III M.D.   On: 05/28/2020 08:15     Discharge Instructions: Discharge Instructions    Diet - low sodium heart healthy   Complete by: As directed    Discharge instructions   Complete by: As directed    FOLLOW-UP INSTRUCTIONS:  Thank you for allowing [place] to be part of your care. You were hospitalized for Atrial Fibrillation with Rapid Ventricular Response.  Please follow up with the following providers: A. [name], MD, [place] / [place] [address], [phone] - within 1 week  B. [name], MD, Cardiology - as soon as possible.   Please note these changes made to your medications:   A. Medications to continue: Outpatient Medications Marked as Taking for the 05/28/20 encounter [facility] Encounter): amiodarone (PACERONE) 200 MG tablet, Take 1 tablet (200 mg total) by mouth daily. ARIPiprazole (ABILIFY) 5 MG tablet, Take 5 mg by mouth daily. atorvastatin (LIPITOR) 40 MG tablet, Take 1 tablet (40 mg total) by mouth at bedtime. buPROPion (WELLBUTRIN SR) 150 MG 12 hr tablet, Take 1 tablet (150 mg total) by mouth 2 (two) times daily. citalopram (CELEXA) 40 MG tablet, Take 1 tablet (40 mg total) by mouth daily. cloNIDine (CATAPRES) 0.1 MG tablet, Take 1 tablet (0.1 mg total) by mouth 2 (two) times daily. diltiazem (CARDIZEM CD) 180 MG 24 hr capsule, Take 1 capsule (180 mg total) by mouth daily. ferrous sulfate 325 (65 FE) MG EC tablet, Take 1  tablet (325 mg total) by mouth 3 (three) times daily with meals. furosemide (LASIX) 40 MG tablet, TAKE 1 TABLET (40 MG TOTAL) BY MOUTH 2 (TWO) TIMES DAILY. hydrOXYzine (ATARAX/VISTARIL) 50 MG tablet, Take 50 mg by mouth 2 (two) times daily as needed for anxiety. lisinopril (ZESTRIL)  10 MG tablet, TAKE 1 TABLET (10 MG TOTAL) BY MOUTH DAILY. Multiple Vitamin (MULTIVITAMIN) tablet, Take 1 tablet by mouth daily. nicotine (NICODERM CQ) 21 mg/24hr patch, Place 1 patch (21 mg total) onto the skin daily. USE FOR 6 WEEKS AND THEN START THE 14 MG (Patient taking differently: Place 21 mg onto the skin daily.) topiramate (TOPAMAX) 25 MG tablet, Take 2 tablets (50 mg total) by mouth at bedtime. traZODone (DESYREL) 150 MG tablet, Take 1 tablet (150 mg total) by mouth at bedtime. XARELTO 20 MG TABS tablet, TAKE 1 TABLET (20 MG TOTAL) BY MOUTH DAILY WITH SUPPER. (Patient taking differently: Take 20 mg by mouth daily with supper.) [DISCONTINUED] Vitamins/Minerals TABS, Take by mouth.      B. Medications to start: Potassium 20 mEq daily for 30 days.    C. Medications to discontinue:  none  Please make sure to take your medications as prescribed and follow up with your PCP/Cardiologist.   Please call our clinic if you have any questions or concerns, we may be able to help and keep you from a long and expensive emergency room wait. Our clinic and after hours phone number is [phone], the best time to call is Monday through Friday 9 am to 4 pm but there is always someone available 24/7 if you have an emergency. If you need medication refills please notify your pharmacy one week in advance and they will send [place] a request.   Increase activity slowly   Complete by: As directed       Signed: Riesa Pope, MD 05/29/2020, 4:02 PM   Pager: [phone]

## 2020-05-30 ENCOUNTER — Telehealth: Payer: Self-pay | Admitting: *Deleted

## 2020-05-30 ENCOUNTER — Telehealth: Payer: Self-pay | Admitting: Internal Medicine

## 2020-05-30 ENCOUNTER — Telehealth: Payer: Self-pay

## 2020-05-30 NOTE — Telephone Encounter (Signed)
Called patient to clarify her home medications. Patient states that she was not taking any of her home medications prior to being admitted to the hospital. I reviewed her medications and counseled her to continue to hold off on starting her behavioral health medications until she has followed up with her outpatient provider for dose adjustments. She states that she is making a follow up appointment for this week. I reiterated the importance of taking her cardiovascular medications, including her Xerelto.   Discontinued the following medications: Oxycodone-acetaminophen  Citalopram Burporion Aripiprazole Hydroxyzine    Lawerance Cruel, D.O.  Internal Medicine Resident, PGY-2 [facility]  Pager: [phone] 11:10 AM, 05/30/2020

## 2020-05-30 NOTE — Telephone Encounter (Signed)
Transition Care Management Unsuccessful Follow-up Telephone Call  Date of discharge and from where:  05/29/2020, [facility]   Attempts:  1st Attempt  Reason for unsuccessful TCM follow-up call:  Left voice message on # [phone], call back requested to this CM.  Need to discuss scheduling follow up appointment with Dr Margarita Rana

## 2020-05-30 NOTE — Telephone Encounter (Signed)
Transition Care Management Unsuccessful Follow-up Telephone Call  Date of discharge and from where:  05/29/2020 - [facility]  Attempts:  1st Attempt  Reason for unsuccessful TCM follow-up call:  Unable to reach patient

## 2020-05-30 NOTE — Progress Notes (Deleted)
Called patient to clarify her home medications. Patient states that she was not taking any of her home medications prior to being admitted to the hospital. I reviewed her medications and counseled her to continue to hold off on starting her behavioral health medications until she has followed up with her outpatient provider for dose adjustments. She states that she is making a follow up appointment for this week. I reiterated the importance of taking her cardiovascular medications, including her Xerelto.   Discontinued the following medications: Oxycodone-acetaminophen  Citalopram Burporion Aripiprazole Hydroxyzine    Lawerance Cruel, D.O.  Internal Medicine Resident, PGY-2 [facility]  Pager: [phone] 11:10 AM, 05/30/2020

## 2020-05-31 ENCOUNTER — Telehealth: Payer: Self-pay

## 2020-05-31 NOTE — Telephone Encounter (Signed)
Transition Care Management Unsuccessful Follow-up Telephone Call  Date of discharge and from where:  05/29/2020, [facility]   Attempts: 2nd Attempt  Reason for unsuccessful TCM follow-up call:  Cal pt at # [phone], Unable to reach or leave VM . Voice mail full.   Need to discuss scheduling follow up appointment with Dr Margarita Rana

## 2020-06-03 ENCOUNTER — Telehealth: Payer: Self-pay

## 2020-06-03 NOTE — Telephone Encounter (Signed)
Transition Care Management Unsuccessful Follow-up Telephone Call  Date of discharge and from where:  05/29/2020, [name]   Attempts:  3rd Attempt  Reason for unsuccessful TCM follow-up call:  Voice mail full - call placed to # [phone].  Letter sent to patient requesting she contact [place] to schedule a hospital follow up appointment.

## 2020-07-01 NOTE — Progress Notes (Deleted)
Cardiology Clinic Note   Patient Name: Morgan Bean Date of Encounter: 07/01/2020  Primary Care Provider:  [name], MD Primary Cardiologist:  [name], MD  Patient Profile     Morgan Bean 55 year old female presents the clinic today for follow-up evaluation of her essential hypertension and coronary disease.  Past Medical History    Past Medical History:  Diagnosis Date  . Anemia of other chronic disease 11/13/2013  . Anginal pain ([place])    none in past year  . Anxiety   . Asthma    patient denies  . Candidiasis 06/23/2013  . Chronic diastolic CHF (congestive heart failure) ([place])   . Constipation   . Coronary artery disease    Lexiscan myoview (10/12) with significant ST depression upon Lexiscan injection but no ischemia or infarction on perfusion images. Left heart cath (10/12): 30% mid LAD, 30% ostial D1, 50% ostial D2.    [name] Cough 11/13/2013  . Depression   . Headache 02/22/2014  . Hx of cardiovascular stress test    Lexiscan Myoview (2/16): Breast attenuation artifact, no ischemia, EF 64%; low risk  . Hyperlipemia   . Hypertension   . Iron deficiency    hx of  . Leukocytoclastic vasculitis ([place])    Rash across lower body, occurred in 9/11, diagnosed by skin biopsy. ANA positive. Thought to be secondary to cocaine use.  . Leukopenia 03/21/2013  . Lymphadenopathy 03/21/2013  . Lymphadenopathy 01/30/2014  . Mental disorder   . Pancytopenia ([place])   . Pancytopenia, acquired ([place]) 07/16/2015  . Polysubstance abuse ([place])    Prior cocaine  . Pulmonary HTN ([place])    Echo (9/11) with EF 65%, mild LVH, mild AI, mild MR, moderate to possibly severe TR with PA systolic pressure 52 mmHg. Echo (10/12): severe LV hypertrophy, EF 60-65%, mild MR, mild AI, moderate to severe tricuspid regurgitation, PA systolic pressure 38 mmHg.  Echo 4/14: moderate LVH, EF 65%, normal wall motion, diastolic dysfunction, mild AI, mild MR  . Shortness of breath    a. PFTs 5/14:  FEV1/FVC  99% predicted; FVC 60% predicted; DLCO mildly reduced, mild restriction, air trapping.;  b.  seen by pulmo (Dr. [name]) 5/14: mainly upper airway symptoms - ACE d/c'd (sx's better)  . Stroke ([place]) 2010ish  . Syphilis 04/25/2013  . Tobacco abuse   . Tricuspid regurgitation   . Unspecified deficiency anemia 03/29/2013   Past Surgical History:  Procedure Laterality Date  . ANKLE SURGERY     right  . BONE MARROW ASPIRATION    . CARDIAC CATHETERIZATION    . CARDIAC CATHETERIZATION N/A 04/23/2015   Procedure: Right Heart Cath;  Surgeon: [name], MD;  Location: [facility];  Service: Cardiovascular;  Laterality: N/A;  . CARDIOVERSION N/A 03/11/2018   Procedure: CARDIOVERSION;  Surgeon: [name], MD;  Location: [facility];  Service: Cardiovascular;  Laterality: N/A;  . I & D EXTREMITY  08/09/2011   Procedure: IRRIGATION AND DEBRIDEMENT EXTREMITY;  Surgeon: [name], MD;  Location: [facility];  Service: General;  Laterality: Left;  i & D Left axilla abscess  . LYMPH NODE BIOPSY N/A 04/05/2013   Procedure: EXCISIONAL BIOPSY RIGHT SUBMANDIBULAR NODE, NASAL ENDOSCOPIC WITH BIOPSY NASAL PHARYNX;  Surgeon: [name], MD;  Location: [facility];  Service: ENT;  Laterality: N/A;  . MULTIPLE EXTRACTIONS WITH ALVEOLOPLASTY N/A 03/03/2019   Procedure: MULTIPLE EXTRACTION WITH ALVEOLOPLASTY;  Surgeon: [name], DDS;  Location: [facility];  Service: Oral Surgery;  Laterality: N/A;  . TEE WITHOUT CARDIOVERSION N/A 03/11/2018   Procedure: TRANSESOPHAGEAL ECHOCARDIOGRAM (TEE);  Surgeon: [name], MD;  Location: [facility];  Service: Cardiovascular;  Laterality: N/A;    Allergies  Allergies  Allergen Reactions  . Ciprofloxacin Itching    History of Present Illness    [name] has a PMH of essential hypertension, chronic diastolic CHF, coronary artery disease, pulmonary hypertension, TIA, PAF, and nonrheumatic mitral valve regurgitation, atrial flutter with RVR, COPD, chronic  laryngitis, chronic renal insufficiency stage III, major depressive disorder, DOE, tobacco use, and upper airway cough syndrome.  She is admitted to the hospital 12/19 with acute on chronic diastolic CHF and atrial fibrillation/flutter with RVR.  She reported 4 days of shortness of breath that occurred in the setting of cocaine abuse.  She had not been taking her furosemide and reported that she often forgot to take her medications.  Her echocardiogram at that time showed an EF of 50-55%, mild aortic regurgitation, moderate to severe mitral regurgitation.  She was started on diltiazem and Xarelto.  She presented to the emergency department 2/20.  She she initially had come to see the clinic pharmacist and reported shortness of breath, dizziness, and chest pain.  There were concerns for hypoxia.  Her EKG showed atrial flutter.  She was referred to the ED where her cardiac enzymes were negative and her EKG was stable.  Her CXR showed no acute process.  Her urine toxicology was positive for amphetamines although she denied any use of illegal or illegal amphetamines.  She had AKI with creatinine of 1.7.  She was instructed to hold lisinopril for 5 days.  She underwent cardioversion which was initially successful however did not maintain.  She was started on amiodarone and transition to normal sinus rhythm.  She was seen by Kerin Ransom, PA-C on 4/20 and was doing well.  She was seen by Dr. Melvyn Novas who was concerned that the amiodarone may affect her respiratory status.  However she was deemed okay to continue amiodarone.  She was admitted 9/20 with epiglottitis and neutropenic fever.  She was discharged and reported that she had been feeling better.  Her breathing was stable.  She was noted to have some lower extremity edema and at the end of the day which improved with elevation of the legs.  She was last seen by Dr. Oval Linsey on 01/06/2019.  During that time she denied chest pain or pressure.  She thought she was  overall doing fairly well.  She continued to smoke 1 pack/day and was interested in quitting.  She continued to abstain from illicit drugs.  She was admitted to the hospital 05/29/2020 with increased shortness of breath and chest pain.  Her EKG showed atrial flutter with RVR.  She had not taken her home medications.  Her home medications were restarted and significant improvement in her heart rate was noted.  She remained in atrial flutter at discharge.  She was also noted to have JVD and bibasilar crackles.  She did not have lower extremity swelling.  Her CXR showed evidence of pulmonary edema.  She was given IV furosemide.  When asked about cocaine abuse she reported that she has been doing 1 g of cocaine per day for the last 2 to 3 weeks and reported her last use was 2 days prior to her arrival to the ED.  She was interested in quitting I met with TOC during admission to discuss rehabilitation options.  She was discharged in stable  condition on 05/29/2020.  She presents the clinic today for follow-up evaluation states***.  *** denies chest pain, shortness of breath, lower extremity edema, fatigue, palpitations, melena, hematuria, hemoptysis, diaphoresis, weakness, presyncope, syncope, orthopnea, and PND.   Home Medications    Prior to Admission medications   Medication Sig Start Date End Date Taking? Authorizing Provider  amiodarone (PACERONE) 200 MG tablet Take 1 tablet (200 mg total) by mouth daily. 06/29/19   Erlene Quan, PA-C  ARIPiprazole (ABILIFY) 5 MG tablet Take 5 mg by mouth daily.    [provider]  atorvastatin (LIPITOR) 40 MG tablet Take 1 tablet (40 mg total) by mouth at bedtime. 12/28/18   [name], MD  buPROPion (WELLBUTRIN SR) 150 MG 12 hr tablet Take 1 tablet (150 mg total) by mouth 2 (two) times daily. 12/28/18   [name], MD  citalopram (CELEXA) 40 MG tablet Take 1 tablet (40 mg total) by mouth daily. 12/28/18   [name], MD  cloNIDine (CATAPRES) 0.1  MG tablet Take 1 tablet (0.1 mg total) by mouth 2 (two) times daily. 12/28/18   [name], MD  diltiazem (CARDIZEM CD) 180 MG 24 hr capsule Take 1 capsule (180 mg total) by mouth daily. 12/28/18   [name], MD  ferrous sulfate 325 (65 FE) MG EC tablet Take 1 tablet (325 mg total) by mouth 3 (three) times daily with meals. 12/28/18   [name], MD  furosemide (LASIX) 40 MG tablet TAKE 1 TABLET (40 MG TOTAL) BY MOUTH 2 (TWO) TIMES DAILY. 04/19/19   [name], MD  hydrOXYzine (ATARAX/VISTARIL) 50 MG tablet Take 50 mg by mouth 2 (two) times daily as needed for anxiety.    [provider]  lisinopril (ZESTRIL) 10 MG tablet TAKE 1 TABLET (10 MG TOTAL) BY MOUTH DAILY. 04/19/19 07/18/19  [name], MD  potassium chloride SA (KLOR-CON) 20 MEQ tablet Take 1 tablet (20 mEq total) by mouth daily. 05/29/20 06/28/20  Marianna Payment, MD  potassium chloride SA (KLOR-CON) 20 MEQ tablet TAKE 1 TABLET (20 MEQ TOTAL) BY MOUTH DAILY. 05/29/20 05/29/21  Marianna Payment, MD  topiramate (TOPAMAX) 25 MG tablet Take 2 tablets (50 mg total) by mouth at bedtime. 12/28/18   [name], MD  traZODone (DESYREL) 150 MG tablet Take 1 tablet (150 mg total) by mouth at bedtime. 12/28/18   Newlin, Enobong, MD  XARELTO 20 MG TABS tablet TAKE 1 TABLET (20 MG TOTAL) BY MOUTH DAILY WITH SUPPER. Patient taking differently: Take 20 mg by mouth daily with supper. 04/24/19   [name], MD    Family History    Family History  Problem Relation Age of Onset  . Coronary artery disease Mother   . Diabetes Mother   . Diabetes Father   . Hypertension Father   . Coronary artery disease Father   . Heart attack Father   . Breast cancer Maternal Grandmother   . Diabetes Maternal Grandmother   . Heart attack Maternal Grandmother   . Stroke Maternal Grandmother   . Asthma Grandson    She indicated that her mother is alive. She indicated that her father is alive. She indicated that the status of her maternal  grandmother is unknown. She indicated that the status of her grandson is unknown.  Social History    Social History   Socioeconomic History  . Marital status: Single    Spouse name: Not on file  . Number of children: 2  . Years of education: Not on file  .  Highest education level: Not on file  Occupational History  . Occupation: unemployed  Tobacco Use  . Smoking status: Current Every Day Smoker    Packs/day: 0.50    Years: 30.00    Pack years: 15.00    Types: Cigarettes  . Smokeless tobacco: Never Used  Vaping Use  . Vaping Use: Never used  Substance and Sexual Activity  . Alcohol use: No    Alcohol/week: 0.0 standard drinks    Comment: used in the past  . Drug use: Yes    Frequency: 1.0 times per week    Types: "Crack" cocaine  . Sexual activity: Yes  Other Topics Concern  . Not on file  Social History Narrative   Lives with mom, sisters and brothers.   Social Determinants of Health   Financial Resource Strain: Not on file  Food Insecurity: Not on file  Transportation Needs: Not on file  Physical Activity: Not on file  Stress: Not on file  Social Connections: Not on file  Intimate Partner Violence: Not on file     Review of Systems    General:  No chills, fever, night sweats or weight changes.  Cardiovascular:  No chest pain, dyspnea on exertion, edema, orthopnea, palpitations, paroxysmal nocturnal dyspnea. Dermatological: No rash, lesions/masses Respiratory: No cough, dyspnea Urologic: No hematuria, dysuria Abdominal:   No nausea, vomiting, diarrhea, bright red blood per rectum, melena, or hematemesis Neurologic:  No visual changes, wkns, changes in mental status. All other systems reviewed and are otherwise negative except as noted above.  Physical Exam    VS:  LMP 05/10/2013  , BMI There is no height or weight on file to calculate BMI. GEN: Well nourished, well developed, in no acute distress. HEENT: normal. Neck: Supple, no JVD, carotid bruits, or  masses. Cardiac: RRR, no murmurs, rubs, or gallops. No clubbing, cyanosis, edema.  Radials/DP/PT 2+ and equal bilaterally.  Respiratory:  Respirations regular and unlabored, clear to auscultation bilaterally. GI: Soft, nontender, nondistended, BS + x 4. MS: no deformity or atrophy. Skin: warm and dry, no rash. Neuro:  Strength and sensation are intact. Psych: Normal affect.  Accessory Clinical Findings    Recent Labs: 05/28/2020: ALT 53; B Natriuretic Peptide 429.2 05/29/2020: BUN 22; Creatinine, Ser 1.16; Hemoglobin 11.6; Magnesium 2.2; Platelets 204; Potassium 3.2; Sodium 138   Recent Lipid Panel    Component Value Date/Time   CHOL 154 08/21/2015 1212   TRIG 109 08/21/2015 1212   HDL 43 (L) 08/21/2015 1212   CHOLHDL 3.6 08/21/2015 1212   VLDL 22 08/21/2015 1212   LDLCALC 89 08/21/2015 1212   LDLDIRECT 142.1 12/25/2010 0846    ECG personally reviewed by me today- *** - No acute changes  Echocardiogram 01/25/2019 IMPRESSIONS    1. Left ventricular ejection fraction, by visual estimation, is 60 to  65%. The left ventricle has normal function. There is no left ventricular  hypertrophy.  2. Global right ventricle has normal systolic function.The right  ventricular size is normal. No increase in right ventricular wall  thickness.  3. Left atrial size was moderately dilated.  4. Right atrial size was normal.  5. The mitral valve is normal in structure. Mild mitral valve  regurgitation. No evidence of mitral stenosis.  6. The tricuspid valve is normal in structure. Tricuspid valve  regurgitation is mild.  7. Aortic valve regurgitation is mild to moderate.  8. The aortic valve is tricuspid. Aortic valve regurgitation is mild to  moderate. Mild aortic valve sclerosis without stenosis.  9. The pulmonic valve was grossly normal. Pulmonic valve regurgitation is  mild.  10. Mildly elevated pulmonary artery systolic pressure.  11. The inferior vena cava is normal in size  with greater than 50%  respiratory variability, suggesting right atrial pressure of 3 mmHg.   Assessment & Plan   1.  Atrial fibrillation/flutter with RVR-EKG today shows***.  Reports compliance with her medications. Continue*** Heart healthy low-sodium diet-salty 6 given Increase physical activity as tolerated Avoid triggers caffeine, chocolate, EtOH, dehydration etc.  Chronic diastolic CHF/MR -euvolemic today.  No increased DOE or activity tolerance.  Echocardiogram 01/25/2019 showed EF 60-65% details above. Continue*** Heart healthy low-sodium diet-salty 6 given Increase physical activity as tolerated  Essential hypertension-BP today***.  Well-controlled at home. Continue*** Heart healthy low-sodium diet-salty 6 given Increase physical activity as tolerated  Hyperlipidemia-LDL 89 on 08/21/2015. Follows with PCP  Cocaine abuse-denies any recent use.  Working with TOC on options for assistance. Congratulated on decision to stop using.  Disposition: Follow-up with Dr. Oval Linsey in 3 months.   Jossie Ng. Sidney Kann NP-C    07/01/2020, [address] Office [phone] Fax [phone]  Notice: This dictation was prepared with Dragon dictation along with smaller phrase technology. Any transcriptional errors that result from this process are unintentional and may not be corrected upon review.  I spent***minutes examining this patient, reviewing medications, and using patient centered shared decision making involving her cardiac care.  Prior to her visit I spent greater than 20 minutes reviewing her past medical history,  medications, and prior cardiac tests.

## 2020-07-02 ENCOUNTER — Ambulatory Visit: Payer: Medicaid Other | Admitting: General Practice

## 2020-07-10 ENCOUNTER — Ambulatory Visit: Payer: Medicaid Other | Attending: Physician Assistant | Admitting: Physician Assistant

## 2020-07-10 ENCOUNTER — Other Ambulatory Visit: Payer: Self-pay

## 2020-07-10 ENCOUNTER — Encounter: Payer: Self-pay | Admitting: Physician Assistant

## 2020-07-10 DIAGNOSIS — F1910 Other psychoactive substance abuse, uncomplicated: Secondary | ICD-10-CM

## 2020-07-10 DIAGNOSIS — E876 Hypokalemia: Secondary | ICD-10-CM

## 2020-07-10 DIAGNOSIS — I4892 Unspecified atrial flutter: Secondary | ICD-10-CM | POA: Diagnosis not present

## 2020-07-10 DIAGNOSIS — I5033 Acute on chronic diastolic (congestive) heart failure: Secondary | ICD-10-CM

## 2020-07-10 MED ORDER — POTASSIUM CHLORIDE CRYS ER 20 MEQ PO TBCR
20.0000 meq | EXTENDED_RELEASE_TABLET | Freq: Every day | ORAL | 1 refills | Status: DC
  Filled 2020-07-10 – 2020-12-16 (×2): qty 30, 30d supply, fill #0
  Filled 2021-02-15 – 2021-02-24 (×2): qty 30, 30d supply, fill #1

## 2020-07-10 NOTE — Progress Notes (Signed)
Patient ID: Morgan Bean, female   DOB: Dec 21, 1965, 55 y.o.   MRN: [id number]   Virtual Visit via Telephone Note  I connected with Morgan Bean on 07/10/20 at  9:50 AM EDT by telephone and verified that I am speaking with the correct person using two identifiers.  Location: Patient: home Provider: [facility] office   I discussed the limitations, risks, security and privacy concerns of performing an evaluation and management service by telephone and the availability of in person appointments. I also discussed with the patient that there may be a patient responsible charge related to this service. The patient expressed understanding and agreed to proceed.   History of Present Illness: After hospitalization 3/8-3/9 with SOB and CP.  She is feeling "up and down".  Still having some SOB but this is her "baseline".  No CP. She missed her follow-up with cardiology.  She is not taking the potassium.  She says she is taking all of her other medicines.  She says she has not used cocaine or other illicit substances or alcohol since hospitalization.  She has previously attended 12 step recovery but not currently in active recovery.     Disposition and follow-up:   Morgan Bean was discharged from [facility] in Stable condition.  At the hospital follow up visit please address:  1.  Follow-up:             a.  Atrial flutter with RVR-no longer RVR.  Still a flutter.  Continued home meds                          b.  Chronic HFpEF-follow-up with cardiology outpatient, continue home meds.              c.  Substance abuse-patient to follow-up with outpatient rehab services              d.  Hypokalemia-continued on potassium supplements  2.  Labs / imaging needed at time of follow-up: BMP  3.  Pending labs/ test needing follow-up: None  4.  Medication Changes             Started: Potassium chloride 20 mEq twice daily             Stopped: None             Changed: Lasix changed from  40 mg to 20 mg twice daily             Abx -none          A. fib with RVR Patient presented to the ED with 2 to 3-week history of worsening shortness of breath and chest pain.  She was found to be in atrial flutter and RVR.  She denied taking her home medications of amiodarone, diltiazem, Xarelto.  These medications were restarted during her admission and she had improvement of her heart rate.  She remained in a flutter at discharge.  Recommend she follow-up with her cardiologist Dr. Oval Linsey.  Plan to reach out to cards master to have her schedule appointment for the patient.  Does not appear she has seen Dr. Oval Linsey since 2020.  Chronic diastolic heart failure Moderate to severe mitral regurgitation Patient endorsed orthopnea and worsening shortness of breath as well as PND.  On admission she had JVD and bibasilar crackles without lower extremity swelling.  Her chest x-ray was with evidence of pulmonary edema.  She is supposed to be on Lasix 40  mg twice daily but she has not been taking this.  She was given 40 mg IV Lasix during her admission.  On repeat examination today she had no JVD and her lungs were clear to auscultation.  Will discharge home with Lasix 20 mg twice daily and have her follow-up with Dr. Oval Linsey.  We will also discharge her home with potassium supplementation.  Patient to have appointment scheduled with Dr. Oval Linsey. Patient will also need to follow-up with primary care provider.  Cocaine abuse Patient with cocaine use, states she goes on binges every 3 months.  She has been doing 1 g of cocaine per day for the past 2 to 3 weeks last reported use was 2 days prior to arrival.  She does have interest in cessation and met with TOC during her admission to discuss rehabilitation options.  Electrolyte abnormalities Patient with hypokalemia during her admission.  Was supplemented and discharged home with potassium supplementation.  She is also continued on Lasix 20 mg.  Will need  to follow-up with primary care provider or cardiology for repeat BMP on outpatient basis.  Discharge Subjective: She says her breathing has improved although lower extremity remains about the same, bilaterally. She says she takes her medications irregularly at home and feels overwhelmed by the amount of medications she is on.    DG Chest 2 View  Result Date: 05/28/2020 CLINICAL DATA:  Chest pain and shortness of breath EXAM: CHEST - 2 VIEW COMPARISON:  December 16, 2018 FINDINGS: There is ill-defined opacity in each lower lung region, somewhat more on the right than on the left. No consolidation. There is slight scarring in right mid lung. Heart is mildly enlarged with pulmonary vascularity normal, stable. No adenopathy. There is aortic atherosclerosis. No bone lesions. IMPRESSION: Ill-defined airspace opacity in the bases, somewhat more notable on the right than the left. Suspect underlying pneumonia, potentially of atypical organism etiology. Check of COVID-19 status in this regard may be advisable. Mild scarring right mid lung medially. Stable cardiac prominence. No adenopathy. Aortic Atherosclerosis (ICD10-I70.0). Electronically Signed   By: Lowella Grip III M.D.   On: 05/28/2020 08:15   A. Medications to continue: Outpatient Medications Marked as Taking for the 05/28/20 encounter [facility] Encounter): amiodarone (PACERONE) 200 MG tablet, Take 1 tablet (200 mg total) by mouth daily. ARIPiprazole (ABILIFY) 5 MG tablet, Take 5 mg by mouth daily. atorvastatin (LIPITOR) 40 MG tablet, Take 1 tablet (40 mg total) by mouth at bedtime. buPROPion (WELLBUTRIN SR) 150 MG 12 hr tablet, Take 1 tablet (150 mg total) by mouth 2 (two) times daily. citalopram (CELEXA) 40 MG tablet, Take 1 tablet (40 mg total) by mouth daily. cloNIDine (CATAPRES) 0.1 MG tablet, Take 1 tablet (0.1 mg total) by mouth 2 (two) times daily. diltiazem (CARDIZEM CD) 180 MG 24 hr capsule, Take 1 capsule (180 mg total) by mouth  daily. ferrous sulfate 325 (65 FE) MG EC tablet, Take 1 tablet (325 mg total) by mouth 3 (three) times daily with meals. furosemide (LASIX) 40 MG tablet, TAKE 1 TABLET (40 MG TOTAL) BY MOUTH 2 (TWO) TIMES DAILY. hydrOXYzine (ATARAX/VISTARIL) 50 MG tablet, Take 50 mg by mouth 2 (two) times daily as needed for anxiety. lisinopril (ZESTRIL) 10 MG tablet, TAKE 1 TABLET (10 MG TOTAL) BY MOUTH DAILY. Multiple Vitamin (MULTIVITAMIN) tablet, Take 1 tablet by mouth daily. nicotine (NICODERM CQ) 21 mg/24hr patch, Place 1 patch (21 mg total) onto the skin daily. USE FOR 6 WEEKS AND THEN START THE 14 MG (Patient taking  differently: Place 21 mg onto the skin daily.) topiramate (TOPAMAX) 25 MG tablet, Take 2 tablets (50 mg total) by mouth at bedtime. traZODone (DESYREL) 150 MG tablet, Take 1 tablet (150 mg total) by mouth at bedtime. XARELTO 20 MG TABS tablet, TAKE 1 TABLET (20 MG TOTAL) BY MOUTH DAILY WITH SUPPER. (Patient taking differently: Take 20 mg by mouth daily with supper.)    Observations/Objective: NAD.  A&Ox3  Assessment and Plan: 1. Hypokalemia Not currently takin-resume - potassium chloride SA (KLOR-CON) 20 MEQ tablet; Take 1 tablet (20 mEq total) by mouth daily.  Dispense: 30 tablet; Refill: 1 - Basic metabolic panel; Future  2. Substance abuse ([place]) I have counseled the patient at length about substance abuse and addiction.  12 step meetings/recovery recommended.  Local 12 step meeting lists were given and attendance was encouraged.  Patient expresses understanding.    3. Acute on chronic diastolic CHF (congestive heart failure) ([name]) Continue current regimen - Ambulatory referral to Cardiology  4. Atrial flutter with rapid ventricular response ([name]) Continue current regimen - Ambulatory referral to Cardiology   Follow Up Instructions: See PCP in 6-8 weeks with chronic conditions.    I discussed the assessment and treatment plan with the patient. The patient was provided an  opportunity to ask questions and all were answered. The patient agreed with the plan and demonstrated an understanding of the instructions.   The patient was advised to call back or seek an in-person evaluation if the symptoms worsen or if the condition fails to improve as anticipated.  I provided 14 minutes of non-face-to-face time during this encounter.   Freeman Caldron, PA-C

## 2020-07-17 ENCOUNTER — Other Ambulatory Visit: Payer: Self-pay

## 2020-08-03 IMAGING — CT CT NECK W/ CM
3 of 4 series · 13 of 33 positions shown, 16 images · IV contrast (Omni 300)
Comparison: Neck CT 12/16/2018

CLINICAL DATA: Sore throat/stridor, epiglottitis or tonsillitis
suspected.

EXAM:
CT NECK WITH CONTRAST
TECHNIQUE: Multidetector CT imaging of the neck was performed using the
standard protocol following the bolus administration of intravenous
contrast.
CONTRAST:  75mL OMNIPAQUE IOHEXOL 300 MG/ML  SOLN

[Series 3: neck 2.0 st · axial · 0.51mm/px · z∈[-210,-66]mm · 5 of 110 slices shown, 7 images (1 of 3)]
[im 19/110  soft-tissue]
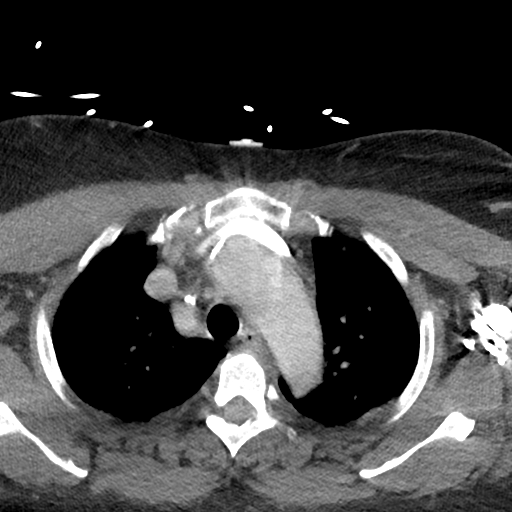
[im 19/110  bone]
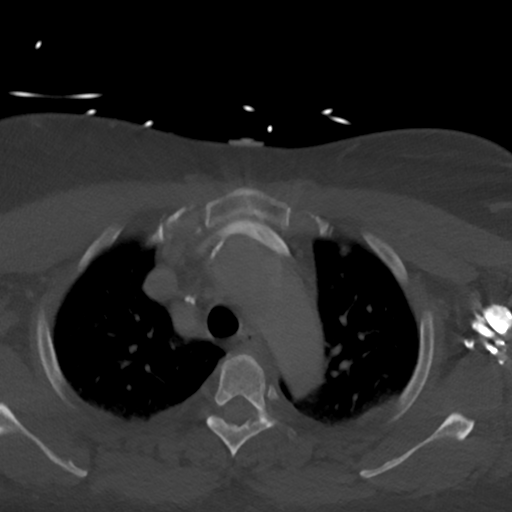
[im 37/110  bone]
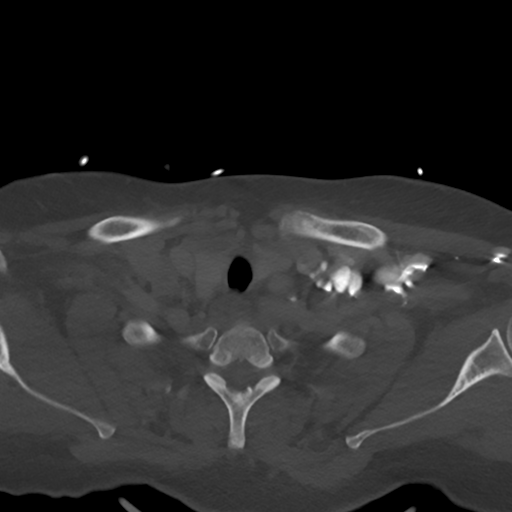
[im 55/110  bone]
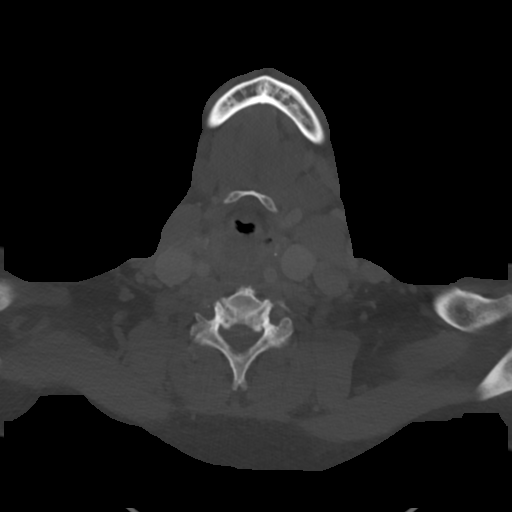
[im 73/110  bone]
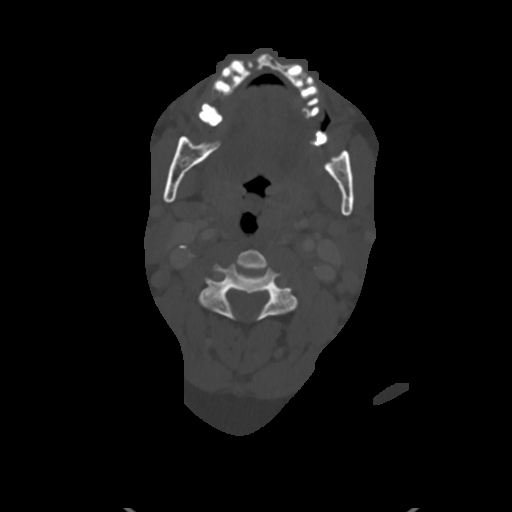
[im 91/110  soft-tissue]
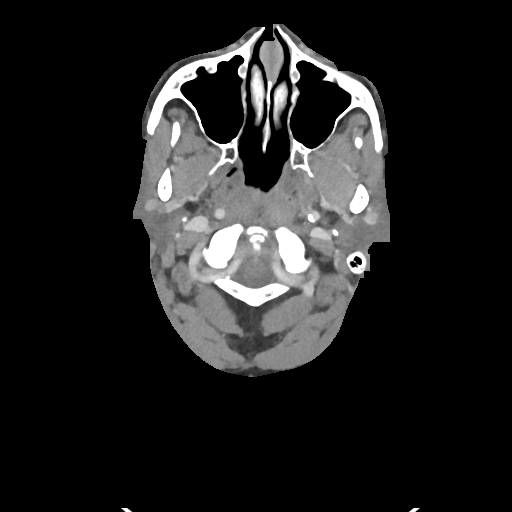
[im 91/110  bone]
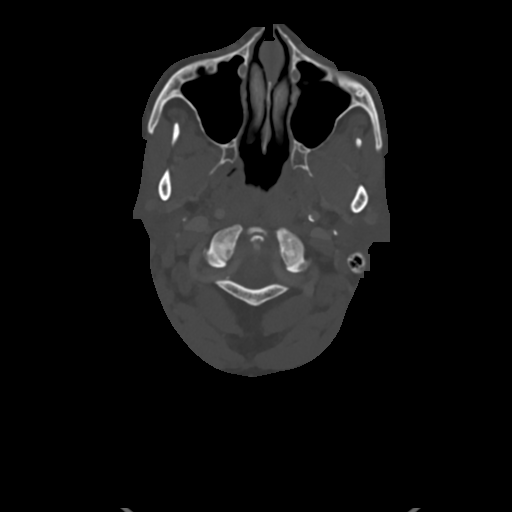

[Series 5: neck 2.0 st · sagittal · 0.45mm/px · 5 of 83 slices shown, 6 images (2 of 3)]
[im 28/83  bone]
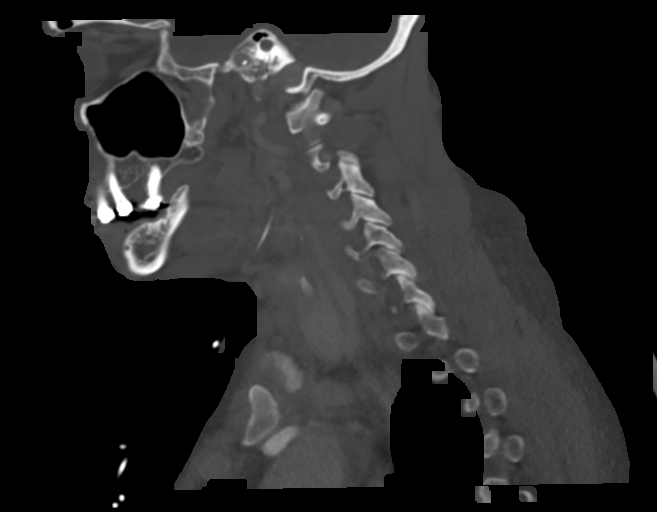
[im 35/83  bone]
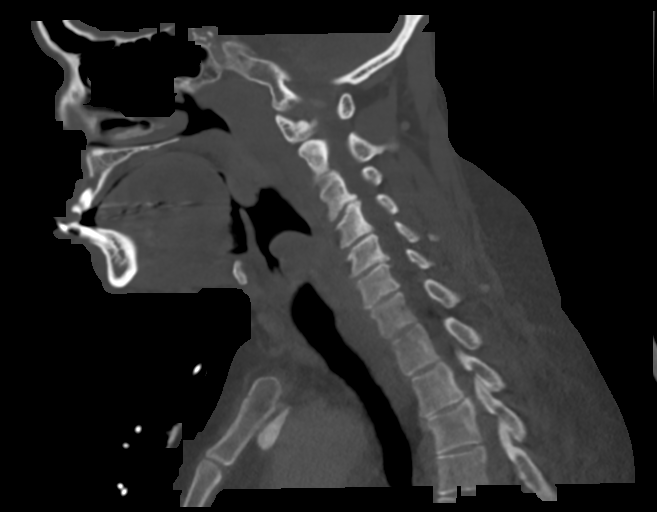
[im 42/83  soft-tissue]
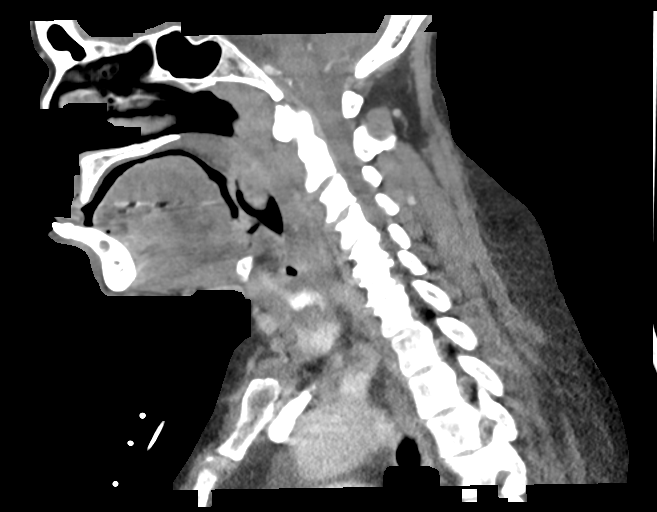
[im 42/83  bone]
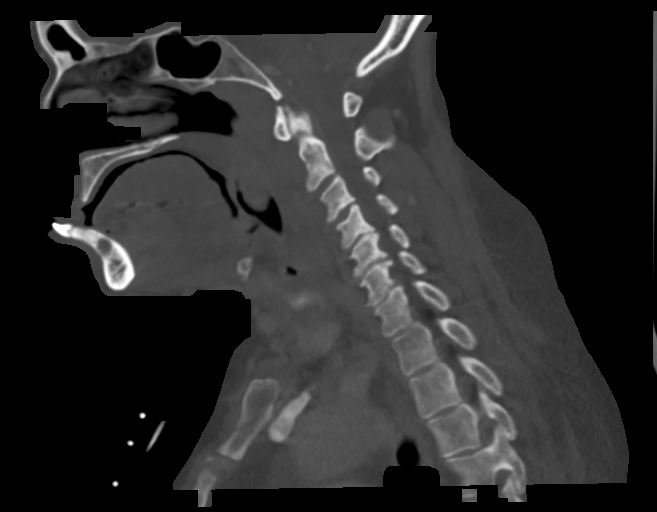
[im 48/83  bone]
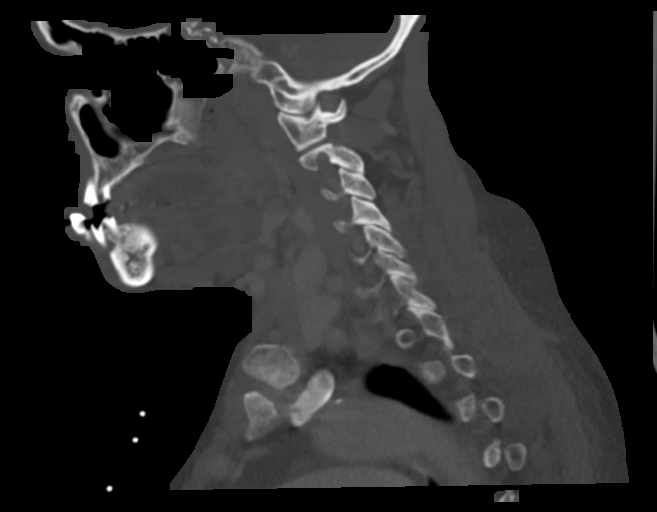
[im 55/83  bone]
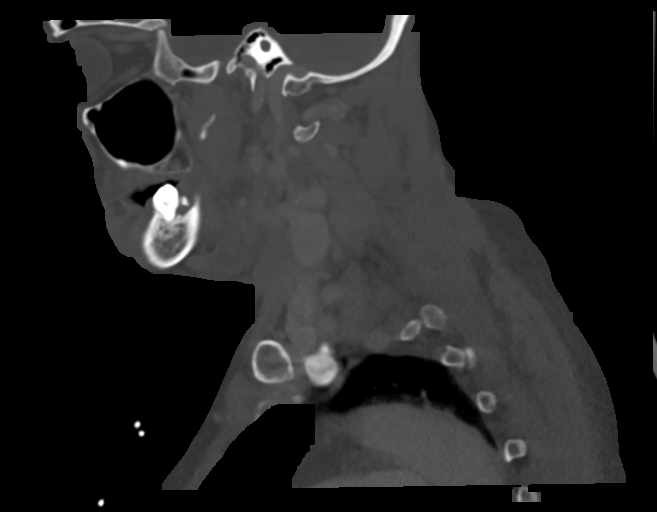

[Series 6: neck 2.0 st · coronal · 0.47mm/px · 3 of 96 slices shown (3 of 3)]
[im 26/96  bone]
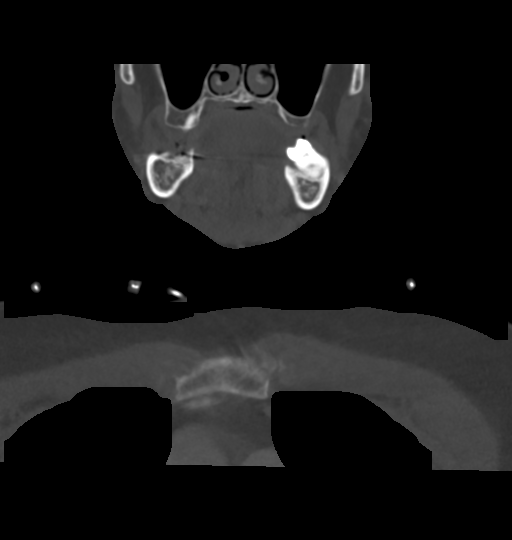
[im 41/96  bone]
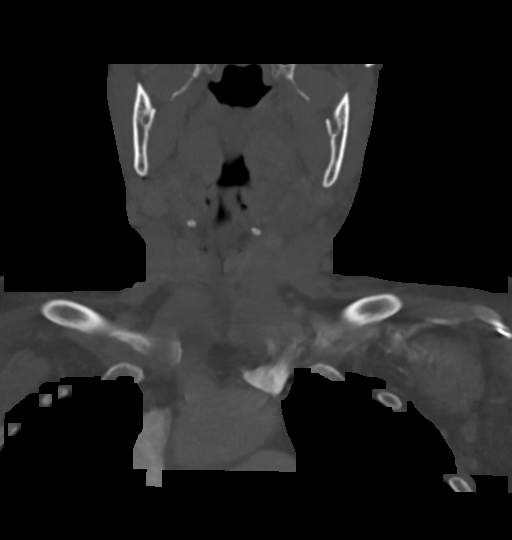
[im 55/96  bone]
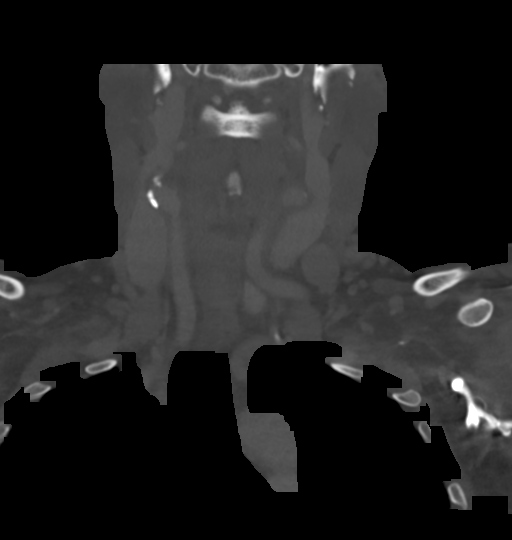

[13 of 33 positions shown; findings below may reference images not displayed]

FINDINGS: Pharynx and larynx:

Poor dentition with multiple absent teeth, periapical lucencies and
dental caries.

There is persistent symmetric swelling and hyperenhancement and
edematous changes involved the bilateral palatine tonsil. Interval
decrease in swelling of the bilateral lingual tonsils and
epiglottis. Persistent diffuse mucosal edema seen throughout the
adjacent oropharynx, extending inferiorly to involve the
supraglottic larynx and hypopharynx, suggesting associated
pharyngitis/supraglottitis. Persistent masslike thickening and edema
of the hypopharynx with associated enhancement at the level of the
piriform sinuses, right greater than left. Narrowing of the
supraglottic airway appears progressed from prior exam, now moderate
(series 3, image 57) (series 5, image 36). Glottis within normal
limits inferiorly. Subglottic airway without appreciable mass or
swelling. No organized fluid collection is identified. Associated
hazy inflammatory stranding and induration within the adjacent
parapharyngeal space bilaterally and extending into the
submandibular and submental spaces, similar to prior exam.

Salivary glands: Parotid glands are unremarkable. Inflammatory
stranding surrounding the bilateral submandibular glands, although
the submandibular glands appear intrinsically unremarkable.

Thyroid: Unremarkable

Lymph nodes: Persistent bilateral cervical lymphadenopathy, most
prominent at the bilateral level II/III stations.

Vascular: The major vascular structures of the neck appear patent.
Atherosclerotic calcification within the bilateral carotid and
vertebral artery systems. Both cervical internal carotid arteries
are somewhat medialized into the retropharyngeal space.

Limited intracranial: Atherosclerotic calcification of the carotid
artery siphons. Otherwise unremarkable.

Visualized orbits: No acute abnormality.

Mastoids and visualized paranasal sinuses: Visualized paranasal
sinuses are clear. Visualized mastoid air cells are clear.

Skeleton: No acute bony abnormality.

Upper chest: No consolidation within the imaged lung apices.
Bilateral dependent atelectasis

These results were called by telephone at the time of interpretation
on 12/20/2018 at [DATE] to provider JOHANNEA LUSANDA , who verbally
acknowledged these results.
IMPRESSION: 1. Again demonstrated are findings consistent with acute
pharyngitis, tonsillitis and supraglottitis. Although swelling of
the epiglottis and lingual tonsils has decreased, the remainder of
the supraglottic swelling is similar or even subtly increased.
Associated narrowing of the supraglottic airway appears progressed,
now moderate. No organized drainable collection identified.
Associated masslike thickening and edema of the hypopharynx at the
level of the piriform sinus appears similar to prior exam, and
likely reflects acute inflammation, although underlying mass could
have this appearance. ENT follow-up with imaging follow-up to
resolution recommended.
2. Persistent bilateral cervical lymphadenopathy, likely reactive.

## 2020-08-06 NOTE — Progress Notes (Deleted)
[facility] Note   Patient Name: Morgan [facility] Date of Encounter: 08/06/2020  Primary Care Provider:  [name], MD Primary Cardiologist:  Morgan Latch, MD  Patient Profile    Morgan [facility] 55 year old female presents the clinic today for follow-up evaluation of her essential hypertension and coronary disease.  Past Medical History    Past Medical History:  Diagnosis Date  . Anemia of other chronic disease 11/13/2013  . Anginal pain ([place])    none in past year  . Anxiety   . Asthma    patient denies  . Candidiasis 06/23/2013  . Chronic diastolic CHF (congestive heart failure) ([place])   . Constipation   . Coronary artery disease    Lexiscan myoview (10/12) with significant ST depression upon Lexiscan injection but no ischemia or infarction on perfusion images. Left heart cath (10/12): 30% mid LAD, 30% ostial D1, 50% ostial D2.    [name] Cough 11/13/2013  . Depression   . Headache 02/22/2014  . Hx of cardiovascular stress test    Lexiscan Myoview (2/16): Breast attenuation artifact, no ischemia, EF 64%; low risk  . Hyperlipemia   . Hypertension   . Iron deficiency    hx of  . Leukocytoclastic vasculitis ([place])    Rash across lower body, occurred in 9/11, diagnosed by skin biopsy. ANA positive. Thought to be secondary to cocaine use.  . Leukopenia 03/21/2013  . Lymphadenopathy 03/21/2013  . Lymphadenopathy 01/30/2014  . Mental disorder   . Pancytopenia ([place])   . Pancytopenia, acquired ([place]) 07/16/2015  . Polysubstance abuse ([place])    Prior cocaine  . Pulmonary HTN ([place])    Echo (9/11) with EF 65%, mild LVH, mild AI, mild MR, moderate to possibly severe TR with PA systolic pressure 52 mmHg. Echo (10/12): severe LV hypertrophy, EF 60-65%, mild MR, mild AI, moderate to severe tricuspid regurgitation, PA systolic pressure 38 mmHg.  Echo 4/14: moderate LVH, EF 65%, normal wall motion, diastolic dysfunction, mild AI, mild MR  . Shortness of breath    a. PFTs 5/14:   FEV1/FVC 99% predicted; FVC 60% predicted; DLCO mildly reduced, mild restriction, air trapping.;  b.  seen by pulmo (Morgan [facility]) 5/14: mainly upper airway symptoms - ACE d/c'd (sx's better)  . Stroke ([place]) 2010ish  . Syphilis 04/25/2013  . Tobacco abuse   . Tricuspid regurgitation   . Unspecified deficiency anemia 03/29/2013   Past Surgical History:  Procedure Laterality Date  . ANKLE SURGERY     right  . BONE MARROW ASPIRATION    . CARDIAC CATHETERIZATION    . CARDIAC CATHETERIZATION N/A 04/23/2015   Procedure: Right Heart Cath;  Surgeon: Morgan Dresser, MD;  Location: [facility];  Service: Cardiovascular;  Laterality: N/A;  . CARDIOVERSION N/A 03/11/2018   Procedure: CARDIOVERSION;  Surgeon: Morgan Records, MD;  Location: [facility];  Service: Cardiovascular;  Laterality: N/A;  . I & D EXTREMITY  08/09/2011   Procedure: IRRIGATION AND DEBRIDEMENT EXTREMITY;  Surgeon: Morgan Roof, MD;  Location: [facility];  Service: General;  Laterality: Left;  i & D Left axilla abscess  . LYMPH NODE BIOPSY N/A 04/05/2013   Procedure: EXCISIONAL BIOPSY RIGHT SUBMANDIBULAR NODE, NASAL ENDOSCOPIC WITH BIOPSY NASAL PHARYNX;  Surgeon: Morgan Marble, MD;  Location: [facility];  Service: ENT;  Laterality: N/A;  . MULTIPLE EXTRACTIONS WITH ALVEOLOPLASTY N/A 03/03/2019   Procedure: MULTIPLE EXTRACTION WITH ALVEOLOPLASTY;  Surgeon: [facility], DDS;  Location: [facility];  Service: Oral Surgery;  Laterality: N/A;  . TEE WITHOUT CARDIOVERSION N/A 03/11/2018   Procedure: TRANSESOPHAGEAL ECHOCARDIOGRAM (TEE);  Surgeon: Morgan Records, MD;  Location[facility];  Service: Cardiovascular;  Laterality: N/A;    Allergies  Allergies  Allergen Reactions  . Ciprofloxacin Itching    History of Present Illness    Morgan [facility] has a PMH of essential hypertension, chronic diastolic CHF, coronary artery disease, pulmonary hypertension, TIA, PAF, and nonrheumatic mitral valve regurgitation, atrial flutter with RVR, COPD, chronic  laryngitis, chronic renal insufficiency stage III, major depressive disorder, DOE, tobacco use, and upper airway cough syndrome.  She is admitted to the hospital 12/19 with acute on chronic diastolic CHF and atrial fibrillation/flutter with RVR.  She reported 4 days of shortness of breath that occurred in the setting of cocaine abuse.  She had not been taking her furosemide and reported that she often forgot to take her medications.  Her echocardiogram at that time showed an EF of 50-55%, mild aortic regurgitation, moderate to severe mitral regurgitation.  She was started on diltiazem and Xarelto.  She presented to the emergency department 2/20.  She she initially had come to see the clinic pharmacist and reported shortness of breath, dizziness, and chest pain.  There were concerns for hypoxia.  Her EKG showed atrial flutter.  She was referred to the ED where her cardiac enzymes were negative and her EKG was stable.  Her CXR showed no acute process.  Her urine toxicology was positive for amphetamines although she denied any use of illegal or illegal amphetamines.  She had AKI with creatinine of 1.7.  She was instructed to hold lisinopril for 5 days.  She underwent cardioversion which was initially successful however did not maintain.  She was started on amiodarone and transition to normal sinus rhythm.  She was seen by Morgan Ransom, PA-C on 4/20 and was doing well.  She was seen by Morgan [facility] who was concerned that the amiodarone may affect her respiratory status.  However she was deemed okay to continue amiodarone.  She was admitted 9/20 with epiglottitis and neutropenic fever.  She was discharged and reported that she had been feeling better.  Her breathing was stable.  She was noted to have some lower extremity edema and at the end of the day which improved with elevation of the legs.  She was last seen by Morgan [facility] on 01/06/2019.  During that time she denied chest pain or pressure.  She thought she was  overall doing fairly well.  She continued to smoke 1 pack/day and was interested in quitting.  She continued to abstain from illicit drugs.  She was admitted to the hospital 05/29/2020 with increased shortness of breath and chest pain.  Her EKG showed atrial flutter with RVR.  She had not taken her home medications.  Her home medications were restarted and significant improvement in her heart rate was noted.  She remained in atrial flutter at discharge.  She was also noted to have JVD and bibasilar crackles.  She did not have lower extremity swelling.  Her CXR showed evidence of pulmonary edema.  She was given IV furosemide.  When asked about cocaine abuse she reported that she has been doing 1 g of cocaine per day for the last 2 to 3 weeks and reported her last use was 2 days prior to her arrival to the ED.  She was interested in quitting I met with TOC during admission to discuss rehabilitation options.  She was discharged in stable  condition on 05/29/2020.  She presents the clinic today for follow-up evaluation states***.  *** denies chest pain, shortness of breath, lower extremity edema, fatigue, palpitations, melena, hematuria, hemoptysis, diaphoresis, weakness, presyncope, syncope, orthopnea, and PND.  Home Medications    Prior to Admission medications   Medication Sig Start Date End Date Taking? Authorizing Provider  amiodarone (PACERONE) 200 MG tablet Take 1 tablet (200 mg total) by mouth daily. 06/29/19   Erlene Quan, PA-C  ARIPiprazole (ABILIFY) 5 MG tablet Take 5 mg by mouth daily.    [provider]  atorvastatin (LIPITOR) 40 MG tablet Take 1 tablet (40 mg total) by mouth at bedtime. 12/28/18   Morgan Rakes, MD  buPROPion (WELLBUTRIN SR) 150 MG 12 hr tablet Take 1 tablet (150 mg total) by mouth 2 (two) times daily. 12/28/18   Morgan Rakes, MD  citalopram (CELEXA) 40 MG tablet Take 1 tablet (40 mg total) by mouth daily. 12/28/18   Morgan Rakes, MD  cloNIDine (CATAPRES) 0.1 MG  tablet Take 1 tablet (0.1 mg total) by mouth 2 (two) times daily. 12/28/18   Morgan Rakes, MD  diltiazem (CARDIZEM CD) 180 MG 24 hr capsule Take 1 capsule (180 mg total) by mouth daily. 12/28/18   Morgan Rakes, MD  ferrous sulfate 325 (65 FE) MG EC tablet Take 1 tablet (325 mg total) by mouth 3 (three) times daily with meals. 12/28/18   Morgan Rakes, MD  furosemide (LASIX) 40 MG tablet TAKE 1 TABLET (40 MG TOTAL) BY MOUTH 2 (TWO) TIMES DAILY. 04/19/19   Morgan Latch, MD  hydrOXYzine (ATARAX/VISTARIL) 50 MG tablet Take 50 mg by mouth 2 (two) times daily as needed for anxiety.    [provider]  lisinopril (ZESTRIL) 10 MG tablet TAKE 1 TABLET (10 MG TOTAL) BY MOUTH DAILY. 04/19/19 07/18/19  Morgan Latch, MD  potassium chloride SA (KLOR-CON) 20 MEQ tablet Take 1 tablet (20 mEq total) by mouth daily. 07/10/20 08/09/20  Argentina Donovan, PA-C  topiramate (TOPAMAX) 25 MG tablet Take 2 tablets (50 mg total) by mouth at bedtime. 12/28/18   Morgan Rakes, MD  traZODone (DESYREL) 150 MG tablet Take 1 tablet (150 mg total) by mouth at bedtime. 12/28/18   Newlin, Enobong, MD  XARELTO 20 MG TABS tablet TAKE 1 TABLET (20 MG TOTAL) BY MOUTH DAILY WITH SUPPER. Patient taking differently: Take 20 mg by mouth daily with supper. 04/24/19   Morgan Latch, MD    Family History    Family History  Problem Relation Age of Onset  . Coronary artery disease Mother   . Diabetes Mother   . Diabetes Father   . Hypertension Father   . Coronary artery disease Father   . Heart attack Father   . Breast cancer Maternal Grandmother   . Diabetes Maternal Grandmother   . Heart attack Maternal Grandmother   . Stroke Maternal Grandmother   . Asthma Grandson    She indicated that her mother is alive. She indicated that her father is alive. She indicated that the status of her maternal grandmother is unknown. She indicated that the status of her grandson is unknown.  Social History    Social History    Socioeconomic History  . Marital status: Single    Spouse name: Not on file  . Number of children: 2  . Years of education: Not on file  . Highest education level: Not on file  Occupational History  . Occupation: unemployed  Tobacco Use  . Smoking status: Current Every  Day Smoker    Packs/day: 0.50    Years: 30.00    Pack years: 15.00    Types: Cigarettes  . Smokeless tobacco: Never Used  Vaping Use  . Vaping Use: Never used  Substance and Sexual Activity  . Alcohol use: No    Alcohol/week: 0.0 standard drinks    Comment: used in the past  . Drug use: Yes    Frequency: 1.0 times per week    Types: "Crack" cocaine  . Sexual activity: Yes  Other Topics Concern  . Not on file  Social History Narrative   Lives with mom, sisters and brothers.   Social Determinants of Health   Financial Resource Strain: Not on file  Food Insecurity: Not on file  Transportation Needs: Not on file  Physical Activity: Not on file  Stress: Not on file  Social Connections: Not on file  Intimate Partner Violence: Not on file     Review of Systems    General:  No chills, fever, night sweats or weight changes.  Cardiovascular:  No chest pain, dyspnea on exertion, edema, orthopnea, palpitations, paroxysmal nocturnal dyspnea. Dermatological: No rash, lesions/masses Respiratory: No cough, dyspnea Urologic: No hematuria, dysuria Abdominal:   No nausea, vomiting, diarrhea, bright red blood per rectum, melena, or hematemesis Neurologic:  No visual changes, wkns, changes in mental status. All other systems reviewed and are otherwise negative except as noted above.  Physical Exam    VS:  LMP 05/10/2013  , BMI There is no height or weight on file to calculate BMI. GEN: Well nourished, well developed, in no acute distress. HEENT: normal. Neck: Supple, no JVD, carotid bruits, or masses. Cardiac: RRR, no murmurs, rubs, or gallops. No clubbing, cyanosis, edema.  Radials/DP/PT 2+ and equal  bilaterally.  Respiratory:  Respirations regular and unlabored, clear to auscultation bilaterally. GI: Soft, nontender, nondistended, BS + x 4. MS: no deformity or atrophy. Skin: warm and dry, no rash. Neuro:  Strength and sensation are intact. Psych: Normal affect.  Accessory Clinical Findings    Recent Labs: 05/28/2020: ALT 53; B Natriuretic Peptide 429.2 05/29/2020: BUN 22; Creatinine, Ser 1.16; Hemoglobin 11.6; Magnesium 2.2; Platelets 204; Potassium 3.2; Sodium 138   Recent Lipid Panel    Component Value Date/Time   CHOL 154 08/21/2015 1212   TRIG 109 08/21/2015 1212   HDL 43 (L) 08/21/2015 1212   CHOLHDL 3.6 08/21/2015 1212   VLDL 22 08/21/2015 1212   LDLCALC 89 08/21/2015 1212   LDLDIRECT 142.1 12/25/2010 0846    ECG personally reviewed by me today- *** - No acute changes  Echocardiogram 01/25/2019 IMPRESSIONS     1. Left ventricular ejection fraction, by visual estimation, is 60 to  65%. The left ventricle has normal function. There is no left ventricular  hypertrophy.   2. Global right ventricle has normal systolic function.The right  ventricular size is normal. No increase in right ventricular wall  thickness.   3. Left atrial size was moderately dilated.   4. Right atrial size was normal.   5. The mitral valve is normal in structure. Mild mitral valve  regurgitation. No evidence of mitral stenosis.   6. The tricuspid valve is normal in structure. Tricuspid valve  regurgitation is mild.   7. Aortic valve regurgitation is mild to moderate.   8. The aortic valve is tricuspid. Aortic valve regurgitation is mild to  moderate. Mild aortic valve sclerosis without stenosis.   9. The pulmonic valve was grossly normal. Pulmonic valve regurgitation is  mild.  10. Mildly elevated pulmonary artery systolic pressure.  11. The inferior vena cava is normal in size with greater than 50%  respiratory variability, suggesting right atrial pressure of 3 mmHg.   Assessment & Plan    1.  .  Atrial fibrillation/flutter with RVR-EKG today shows***.  Reports compliance with her medications. Continue amiodarone, diltiazem, Xarelto Heart healthy low-sodium diet-salty 6 given Increase physical activity as tolerated Avoid triggers caffeine, chocolate, EtOH, dehydration etc.  Chronic diastolic CHF/MR -euvolemic today.  No increased DOE or activity tolerance.  Echocardiogram 01/25/2019 showed EF 60-65% details above. Continue lisinopril Heart healthy low-sodium diet-salty 6 given Increase physical activity as tolerated  Essential hypertension-BP today***.  Well-controlled at home. Continue lisinopril, diltiazem, clonidine Heart healthy low-sodium diet-salty 6 given Increase physical activity as tolerated  Hyperlipidemia-LDL 89 on 08/21/2015. Continue atorvastatin Follows with PCP  Cocaine abuse-denies any recent use.  Working with TOC on options for assistance. Congratulated on decision to stop using.  Disposition: Follow-up with Morgan [facility] in 3 months.   Jossie Ng. Ziza Hastings NP-C    08/06/2020, 7:54 AM [address] Office [phone] Fax [phone]  Notice: This dictation was prepared with Dragon dictation along with smaller phrase technology. Any transcriptional errors that result from this process are unintentional and may not be corrected upon review.  I spent***minutes examining this patient, reviewing medications, and using patient centered shared decision making involving her cardiac care.  Prior to her visit I spent greater than 20 minutes reviewing her past medical history,  medications, and prior cardiac tests.

## 2020-08-07 ENCOUNTER — Ambulatory Visit: Payer: Medicaid Other | Admitting: General Practice

## 2020-09-18 ENCOUNTER — Other Ambulatory Visit: Payer: Self-pay

## 2020-09-18 ENCOUNTER — Ambulatory Visit: Payer: Medicaid Other | Attending: Family Medicine | Admitting: Family Medicine

## 2020-09-18 ENCOUNTER — Encounter: Payer: Self-pay | Admitting: Family Medicine

## 2020-09-18 VITALS — BP 177/116 | HR 108 | Ht 66.0 in | Wt 166.8 lb

## 2020-09-18 DIAGNOSIS — I4819 Other persistent atrial fibrillation: Secondary | ICD-10-CM

## 2020-09-18 DIAGNOSIS — R490 Dysphonia: Secondary | ICD-10-CM | POA: Diagnosis not present

## 2020-09-18 DIAGNOSIS — I10 Essential (primary) hypertension: Secondary | ICD-10-CM | POA: Diagnosis not present

## 2020-09-18 DIAGNOSIS — F1910 Other psychoactive substance abuse, uncomplicated: Secondary | ICD-10-CM

## 2020-09-18 MED ORDER — ATORVASTATIN CALCIUM 40 MG PO TABS
40.0000 mg | ORAL_TABLET | Freq: Every day | ORAL | 2 refills | Status: DC
  Filled 2020-09-18: qty 30, 30d supply, fill #0
  Filled 2021-02-15 – 2021-02-24 (×2): qty 30, 30d supply, fill #1
  Filled 2021-04-08 – 2021-04-09 (×2): qty 30, 30d supply, fill #0

## 2020-09-18 MED ORDER — CLONIDINE HCL 0.1 MG PO TABS
0.1000 mg | ORAL_TABLET | Freq: Two times a day (BID) | ORAL | 2 refills | Status: DC
  Filled 2020-09-18: qty 60, 30d supply, fill #0
  Filled 2020-12-16: qty 60, 30d supply, fill #1

## 2020-09-18 MED ORDER — DILTIAZEM HCL ER COATED BEADS 180 MG PO CP24
180.0000 mg | ORAL_CAPSULE | Freq: Every day | ORAL | 2 refills | Status: DC
  Filled 2020-09-18: qty 30, 30d supply, fill #0
  Filled 2020-12-16: qty 30, 30d supply, fill #1

## 2020-09-18 MED ORDER — RIVAROXABAN 20 MG PO TABS
20.0000 mg | ORAL_TABLET | Freq: Every day | ORAL | 3 refills | Status: DC
  Filled 2020-09-18: qty 30, 30d supply, fill #0
  Filled 2020-12-16: qty 30, 30d supply, fill #1
  Filled 2021-02-15 – 2021-02-24 (×2): qty 30, 30d supply, fill #2
  Filled 2021-04-08 – 2021-04-09 (×2): qty 30, 30d supply, fill #0

## 2020-09-18 MED ORDER — LISINOPRIL 10 MG PO TABS
10.0000 mg | ORAL_TABLET | Freq: Every day | ORAL | 2 refills | Status: DC
  Filled 2020-09-18: qty 90, 90d supply, fill #0
  Filled 2021-02-15 – 2021-02-24 (×2): qty 90, 90d supply, fill #1
  Filled 2021-04-08 – 2021-04-09 (×2): qty 90, 90d supply, fill #0

## 2020-09-18 NOTE — Patient Instructions (Signed)
Cocaine Use Disorder Cocaine use disorder is a condition in which your cocaine use disrupts your daily life. Cocaine belongs to a group of powerful drugs known as stimulants. Other names for cocaine include coke, crack, blow, snow, C, powder, and nose candy. This drug has some medical uses, but these are rare. Cocaine is most often misused because of its effects, which include: A feeling of extreme pleasure (euphoria). Alertness. A high energy level. Cocaine use can affect your relationships and how you do your job. Cocaine use disorder can be dangerous because: Cocaine increases your blood pressure. Using cocaine can lead to a heart attack or stroke. Cocaine use can cause fast or irregular heartbeats and seizures. Non-medical cocaine use is illegal and can lead to criminal charges. Cocaine use can be life-threatening. What are the causes? This condition is caused by misusing cocaine. Many people start using cocaine because it makes them feel good or helps them deal with their lives. Over time,they get addicted to it. When they try to stop using it, they feel sick. What increases the risk? The following factors may make you more likely to develop this condition: Misusing other drugs. A family history of misusing drugs. History of mental illness. What are the signs or symptoms? Symptoms of this condition include: Using more cocaine than you want to, or using cocaine for longer than you want to. You may crave cocaine, try several times to use less cocaine, or try to control your cocaine use. You may also need more and more cocaine to get the same effect that you want (build up a tolerance). Spending a lot of time getting cocaine, using it, or recovering from its effects. Having problems at work, at school, at home, or with relationships because of cocaine use. Giving up or cutting down on important life activities because of cocaine use. Using cocaine when it is dangerous, such as when driving  a car. Continuing to use cocaine even though it has led to a physical problem, such as poor nutrition, nosebleeds, and chest pain. Continuing to use cocaine even though it is affecting your mental health. You may develop: Schizophrenia-like symptoms. You may think or act in an unusual way. Depression. Bipolar mood swings. Anxiety. Sleep problems. If you stop using cocaine, you may have symptoms of withdrawal. Symptomsinclude depression, sleep problems, and bad dreams. How is this diagnosed? This condition is diagnosed with an assessment. During the assessment, your health care provider will ask about your cocaine use and about how it affects your life. Your health care provider may also: Do a physical exam or lab tests to see if you have a physical problem resulting from cocaine use. Screen for drug use. Refer you to a mental health professional for evaluation. How is this treated? Treatment for this condition is usually provided by mental health professionals with training in substance use disorders. Treatment may involve: Counseling. This treatment is also called talk therapy. It is provided by substance use treatment counselors. A counselor can address the reasons you use cocaine and suggest ways to keep you from using it again. The goals of talk therapy are to: Find healthy activities to replace using cocaine. Identify and avoid what triggers your cocaine use. Help you learn how to handle cravings. Support groups. Support groups are run by people who have quit using addictive substances. They provide emotional support, advice, and guidance. Medicines. Care at a residential treatment center. Follow these instructions at home: Medicines Take over-the-counter and prescription medicines only as told  by your health care provider. Check with your health care provider before starting any new medicines, herbs, or supplements. General instructions     Do not use any drugs or alcohol. Avoid  people and activities that trigger your use of cocaine. Learn and practice techniques for managing stress. Have a plan for vulnerable moments. Get phone numbers of those who are willing to help and who are committed to your recovery. Attend support groups regularly for emotional support, advice, and guidance. Do not use any products that contain nicotine or tobacco, such as cigarettes, e-cigarettes, and chewing tobacco. If you need help quitting, ask your health care provider. Keep all follow-up visits as told by your health care provider. This is important. This includes work with therapists and support groups. Where to find more information [employer] on Drug Abuse: motorcyclefax.com Substance Abuse and Mental Health Services Administration: ktimeonline.com Narcotics Anonymous: www.na.org Contact a health care provider if: You cannot take your medicines as told. You use cocaine again. Your symptoms get worse. Get help right away if you have: Serious thoughts about hurting yourself or others. Chest pain. Any symptoms of a stroke. "BE FAST" is an easy way to remember the main warning signs of a stroke: B - Balance. Signs are dizziness, sudden trouble walking, or loss of balance. E - Eyes. Signs are trouble seeing or a sudden change in vision. F - Face. Signs are sudden weakness or numbness of the face, or the face or eyelid drooping on one side. A - Arms. Signs are weakness or numbness in an arm. This happens suddenly and usually on one side of the body. S - Speech. Signs are sudden trouble speaking, slurred speech, or trouble understanding what people say. T - Time. Time to call emergency services. Write down what time symptoms started. Other signs of a stroke, such as: A sudden, severe headache with no known cause. Nausea or vomiting. Seizure. These symptoms may represent a serious problem that is an emergency. Do not wait to see if the symptoms will go away. Get medical help  right away. Call your local emergency services (911 in the [place] Do not drive yourself to the hospital. If you ever feel like you may hurt yourself or others, or have thoughts about taking your own life, get help right away. Go to your nearest emergency department or: Call your local emergency services (911 in the [place] Call a suicide crisis helpline, such as the [place] at (667)455-4934. This is open 24 hours a day in the [place] Text the Crisis Text Line at 3030549842 (in the [place] Summary Cocaine has some medical uses, but these are rare. Cocaine is most often misused because of its effects. Many people start using cocaine because it makes them feel good or helps them deal with their lives. Over time, they get addicted to it. Treatment for this condition is usually provided by mental health professionals with training in substance use disorders. This information is not intended to replace advice given to you by your health care provider. Make sure you discuss any questions you have with your healthcare provider. Document Revised: 01/04/2019 Document Reviewed: 01/04/2019 Elsevier Patient Education  [place].

## 2020-09-18 NOTE — Progress Notes (Signed)
No BP medication today.

## 2020-09-18 NOTE — Progress Notes (Signed)
Subjective:  Patient ID: Morgan Bean, female    DOB: 08-01-1965  Age: 55 y.o. MRN: [id number]  CC: Hypertension   HPI Morgan Bean is a 55 year old female patient of Dr Wynetta Emery with a history of Hypertension, paroxysmal Afib, CAD, previous CVA, moderate to severe MR, noncompliance, Deprssion, polysubstance abuse here for follow-up visit.  Interval History: She requests ENT referral as she lost her voice and was previously followed by Dr Erik Obey who informed her she had a 'hole in her throat'.  I have reviewed his previous notes which reveal a diagnosis of chronic laryngitis. Her voice has been hoarse.  She ran out of her medications last week and her blood pressure is severely elevated.  She is also tachycardic.  Has not followed up with her cardiologist in almost 2 years.  Denies presence of chest pain, dyspnea or pedal edema but does have some nasal congestion. Mental Health in managed by Armandina Stammer ongoing use of cocaine Past Medical History:  Diagnosis Date   Anemia of other chronic disease 11/13/2013   Anginal pain ([place])    none in past year   Anxiety    Asthma    patient denies   Candidiasis 06/23/2013   Chronic diastolic CHF (congestive heart failure) ([name])    Constipation    Coronary artery disease    Lexiscan myoview (10/12) with significant ST depression upon Lexiscan injection but no ischemia or infarction on perfusion images. Left heart cath (10/12): 30% mid LAD, 30% ostial D1, 50% ostial D2.     Cough 11/13/2013   Depression    Headache 02/22/2014   Hx of cardiovascular stress test    Lexiscan Myoview (2/16): Breast attenuation artifact, no ischemia, EF 64%; low risk   Hyperlipemia    Hypertension    Iron deficiency    hx of   Leukocytoclastic vasculitis ([name])    Rash across lower body, occurred in 9/11, diagnosed by skin biopsy. ANA positive. Thought to be secondary to cocaine use.   Leukopenia 03/21/2013   Lymphadenopathy 03/21/2013   Lymphadenopathy  01/30/2014   Mental disorder    Pancytopenia ([place])    Pancytopenia, acquired ([place]) 07/16/2015   Polysubstance abuse ([place])    Prior cocaine   Pulmonary HTN ([place])    Echo (9/11) with EF 65%, mild LVH, mild AI, mild MR, moderate to possibly severe TR with PA systolic pressure 52 mmHg. Echo (10/12): severe LV hypertrophy, EF 60-65%, mild MR, mild AI, moderate to severe tricuspid regurgitation, PA systolic pressure 38 mmHg.  Echo 4/14: moderate LVH, EF 65%, normal wall motion, diastolic dysfunction, mild AI, mild MR   Shortness of breath    a. PFTs 5/14:  FEV1/FVC 99% predicted; FVC 60% predicted; DLCO mildly reduced, mild restriction, air trapping.;  b.  seen by pulmo (Dr. [name]) 5/14: mainly upper airway symptoms - ACE d/c'd (sx's better)   Stroke [facility]) 2010ish   Syphilis 04/25/2013   Tobacco abuse    Tricuspid regurgitation    Unspecified deficiency anemia 03/29/2013    Past Surgical History:  Procedure Laterality Date   ANKLE SURGERY     right   BONE MARROW ASPIRATION     CARDIAC CATHETERIZATION     CARDIAC CATHETERIZATION N/A 04/23/2015   Procedure: Right Heart Cath;  Surgeon: [name], MD;  Location: [facility];  Service: Cardiovascular;  Laterality: N/A;   CARDIOVERSION N/A 03/11/2018   Procedure: CARDIOVERSION;  Surgeon: [name], MD;  Location: [facility];  Service:  Cardiovascular;  Laterality: N/A;   I & D EXTREMITY  08/09/2011   Procedure: IRRIGATION AND DEBRIDEMENT EXTREMITY;  Surgeon: [name], MD;  Location: [facility];  Service: General;  Laterality: Left;  i & D Left axilla abscess   LYMPH NODE BIOPSY N/A 04/05/2013   Procedure: EXCISIONAL BIOPSY RIGHT SUBMANDIBULAR NODE, NASAL ENDOSCOPIC WITH BIOPSY NASAL PHARYNX;  Surgeon: [name], MD;  Location: [facility];  Service: ENT;  Laterality: N/A;   MULTIPLE EXTRACTIONS WITH ALVEOLOPLASTY N/A 03/03/2019   Procedure: MULTIPLE EXTRACTION WITH ALVEOLOPLASTY;  Surgeon: [name], DDS;  Location: [facility];   Service: Oral Surgery;  Laterality: N/A;   TEE WITHOUT CARDIOVERSION N/A 03/11/2018   Procedure: TRANSESOPHAGEAL ECHOCARDIOGRAM (TEE);  Surgeon: [name], MD;  Location: [facility];  Service: Cardiovascular;  Laterality: N/A;    Family History  Problem Relation Age of Onset   Coronary artery disease Mother    Diabetes Mother    Diabetes Father    Hypertension Father    Coronary artery disease Father    Heart attack Father    Breast cancer Maternal Grandmother    Diabetes Maternal Grandmother    Heart attack Maternal Grandmother    Stroke Maternal Grandmother    Asthma Grandson     Allergies  Allergen Reactions   Ciprofloxacin Itching    Outpatient Medications Prior to Visit  Medication Sig Dispense Refill   amiodarone (PACERONE) 200 MG tablet Take 1 tablet (200 mg total) by mouth daily. 90 tablet 1   ARIPiprazole (ABILIFY) 5 MG tablet Take 5 mg by mouth daily.     atorvastatin (LIPITOR) 40 MG tablet Take 1 tablet (40 mg total) by mouth at bedtime. 30 tablet 2   buPROPion (WELLBUTRIN SR) 150 MG 12 hr tablet Take 1 tablet (150 mg total) by mouth 2 (two) times daily. 60 tablet 2   citalopram (CELEXA) 40 MG tablet Take 1 tablet (40 mg total) by mouth daily. 30 tablet 2   cloNIDine (CATAPRES) 0.1 MG tablet Take 1 tablet (0.1 mg total) by mouth 2 (two) times daily. 60 tablet 2   diltiazem (CARDIZEM CD) 180 MG 24 hr capsule Take 1 capsule (180 mg total) by mouth daily. 60 capsule 2   ferrous sulfate 325 (65 FE) MG EC tablet Take 1 tablet (325 mg total) by mouth 3 (three) times daily with meals. 60 tablet 3   furosemide (LASIX) 40 MG tablet TAKE 1 TABLET (40 MG TOTAL) BY MOUTH 2 (TWO) TIMES DAILY. 180 tablet 2   hydrOXYzine (ATARAX/VISTARIL) 50 MG tablet Take 50 mg by mouth 2 (two) times daily as needed for anxiety.     topiramate (TOPAMAX) 25 MG tablet Take 2 tablets (50 mg total) by mouth at bedtime. 60 tablet 2   traZODone (DESYREL) 150 MG tablet Take 1 tablet (150 mg total)  by mouth at bedtime. 30 tablet 2   XARELTO 20 MG TABS tablet TAKE 1 TABLET (20 MG TOTAL) BY MOUTH DAILY WITH SUPPER. (Patient taking differently: Take 20 mg by mouth daily with supper.) 90 tablet 1   lisinopril (ZESTRIL) 10 MG tablet TAKE 1 TABLET (10 MG TOTAL) BY MOUTH DAILY. 90 tablet 2   potassium chloride SA (KLOR-CON) 20 MEQ tablet Take 1 tablet (20 mEq total) by mouth daily. 30 tablet 1   No facility-administered medications prior to visit.     ROS Review of Systems  Constitutional:  Negative for activity change, appetite change and fatigue.  HENT:  Positive for voice  change. Negative for congestion, sinus pressure and sore throat.   Eyes:  Negative for visual disturbance.  Respiratory:  Negative for cough, chest tightness, shortness of breath and wheezing.   Cardiovascular:  Negative for chest pain and palpitations.  Gastrointestinal:  Negative for abdominal distention, abdominal pain and constipation.  Endocrine: Negative for polydipsia.  Genitourinary:  Negative for dysuria and frequency.  Musculoskeletal:  Negative for arthralgias and back pain.  Skin:  Negative for rash.  Neurological:  Negative for tremors, light-headedness and numbness.  Hematological:  Does not bruise/bleed easily.  Psychiatric/Behavioral:  Negative for agitation and behavioral problems.    Objective:  BP (!) 177/116   Pulse (!) 108   Ht 5\' 6"  (1.676 m)   Wt 166 lb 12.8 oz (75.7 kg)   LMP 05/10/2013   SpO2 100%   BMI 26.92 kg/m   BP/Weight 09/18/2020 05/29/2020 09/98/3382  Systolic BP 505 397 673  Diastolic BP 419 96 379  Wt. (Lbs) 166.8 159.6 -  BMI 26.92 25.76 -  Some encounter information is confidential and restricted. Go to Review Flowsheets activity to see all data.      Physical Exam Constitutional:      Appearance: She is well-developed.  Neck:     Vascular: No JVD.  Cardiovascular:     Rate and Rhythm: Tachycardia present.     Heart sounds: Normal heart sounds. No murmur  heard. Pulmonary:     Effort: Pulmonary effort is normal.     Breath sounds: Normal breath sounds. No wheezing or rales.  Chest:     Chest wall: No tenderness.  Abdominal:     General: Bowel sounds are normal. There is no distension.     Palpations: Abdomen is soft. There is no mass.     Tenderness: There is no abdominal tenderness.  Musculoskeletal:        General: Normal range of motion.     Right lower leg: No edema.     Left lower leg: No edema.  Neurological:     Mental Status: She is alert and oriented to person, place, and time.  Psychiatric:        Mood and Affect: Mood normal.     Comments: Poor judgment    CMP Latest Ref Rng & Units 05/29/2020 05/28/2020 03/01/2019  Glucose 70 - 99 mg/dL 91 104(H) 93  BUN 6 - 20 mg/dL 22(H) 20 10  Creatinine 0.44 - 1.00 mg/dL 1.16(H) 1.20(H) 1.25(H)  Sodium 135 - 145 mmol/L 138 139 141  Potassium 3.5 - 5.1 mmol/L 3.2(L) 3.4(L) 4.1  Chloride 98 - 111 mmol/L 108 110 108  CO2 22 - 32 mmol/L 21(L) 21(L) 24  Calcium 8.9 - 10.3 mg/dL 8.7(L) 9.0 9.0  Total Protein 6.5 - 8.1 g/dL - 6.8 7.8  Total Bilirubin 0.3 - 1.2 mg/dL - 1.4(H) <0.1(L)  Alkaline Phos 38 - 126 U/L - 95 61  AST 15 - 41 U/L - 68(H) 14(L)  ALT 0 - 44 U/L - 53(H) 13    Lipid Panel     Component Value Date/Time   CHOL 154 08/21/2015 1212   TRIG 109 08/21/2015 1212   HDL 43 (L) 08/21/2015 1212   CHOLHDL 3.6 08/21/2015 1212   VLDL 22 08/21/2015 1212   LDLCALC 89 08/21/2015 1212   LDLDIRECT 142.1 12/25/2010 0846    CBC    Component Value Date/Time   WBC 7.7 05/29/2020 0133   RBC 4.45 05/29/2020 0133   HGB 11.6 (L) 05/29/2020 0133  HGB 13.5 04/11/2018 1532   HGB 10.0 (L) 07/16/2015 1407   HCT 35.7 (L) 05/29/2020 0133   HCT 30.7 (L) 12/16/2018 2300   HCT 31.8 (L) 07/16/2015 1407   PLT 204 05/29/2020 0133   PLT 241 04/11/2018 1532   MCV 80.2 05/29/2020 0133   MCV 76 (L) 04/11/2018 1532   MCV 78.2 (L) 07/16/2015 1407   MCH 26.1 05/29/2020 0133   MCHC 32.5  05/29/2020 0133   RDW 14.5 05/29/2020 0133   RDW 16.4 (H) 04/11/2018 1532   RDW 14.9 (H) 07/16/2015 1407   LYMPHSABS 2.8 12/27/2018 1039   LYMPHSABS 2.4 11/10/2017 0928   LYMPHSABS 1.8 07/16/2015 1407   MONOABS 0.9 12/27/2018 1039   MONOABS 0.8 07/16/2015 1407   EOSABS 0.1 12/27/2018 1039   EOSABS 0.2 11/10/2017 0928   BASOSABS 0.0 12/27/2018 1039   BASOSABS 0.1 11/10/2017 0928   BASOSABS 0.0 07/16/2015 1407    Lab Results  Component Value Date   HGBA1C 5.4 08/21/2015    Assessment & Plan:  1. Essential hypertension Uncontrolled due to running out of medications which I have refilled Counseled on blood pressure goal of less than 130/80, low-sodium, DASH diet, medication compliance, 150 minutes of moderate intensity exercise per week. Discussed medication compliance, adverse effects. - lisinopril (ZESTRIL) 10 MG tablet; Take 1 tablet (10 mg total) by mouth daily.  Dispense: 90 tablet; Refill: 2 - diltiazem (CARDIZEM CD) 180 MG 24 hr capsule; Take 1 capsule (180 mg total) by mouth daily.  Dispense: 60 capsule; Refill: 2 - cloNIDine (CATAPRES) 0.1 MG tablet; Take 1 tablet (0.1 mg total) by mouth 2 (two) times daily.  Dispense: 60 tablet; Refill: 2  2. Other persistent atrial fibrillation ([place]) She is tachycardic due to running out of medications for rate control Ongoing use of cocaine is detrimental to her health and she has been advised on this however it does seem to be that this is impairing her judgment as she does not fully grasp the implication of our discussion Last cardiology follow-up hence I will refer again - rivaroxaban (XARELTO) 20 MG TABS tablet; Take 1 tablet (20 mg total) by mouth daily with supper.  Dispense: 30 tablet; Refill: 3 - diltiazem (CARDIZEM CD) 180 MG 24 hr capsule; Take 1 capsule (180 mg total) by mouth daily.  Dispense: 60 capsule; Refill: 2 - atorvastatin (LIPITOR) 40 MG tablet; Take 1 tablet (40 mg total) by mouth at bedtime.  Dispense: 30 tablet;  Refill: 2 - Ambulatory referral to Cardiology  3. Hoarseness of voice History of chronic laryngitis by previous ENT note - Ambulatory referral to ENT  4. Substance abuse ([place]) Counseled on the need to quit cocaine use but unfortunately she does not seem to be concerned.    No orders of the defined types were placed in this encounter.  Return in about 3 months (around 12/19/2020) for Follow-up of medical condition.       [name], MD, FAAFP. [facility] [place], [place]   09/18/2020, 2:32 PM

## 2020-12-16 ENCOUNTER — Ambulatory Visit (HOSPITAL_BASED_OUTPATIENT_CLINIC_OR_DEPARTMENT_OTHER): Payer: Medicaid Other | Admitting: Family Medicine

## 2020-12-16 DIAGNOSIS — I4892 Unspecified atrial flutter: Secondary | ICD-10-CM

## 2020-12-16 DIAGNOSIS — I5042 Chronic combined systolic (congestive) and diastolic (congestive) heart failure: Secondary | ICD-10-CM

## 2020-12-16 DIAGNOSIS — I110 Hypertensive heart disease with heart failure: Secondary | ICD-10-CM

## 2020-12-16 DIAGNOSIS — R490 Dysphonia: Secondary | ICD-10-CM

## 2020-12-16 NOTE — Progress Notes (Signed)
Virtual Visit via Telephone Note  I connected with Morgan Bean, on 12/16/2020 at 9:47 AM by telephone due to the COVID-19 pandemic and verified that I am speaking with the correct person using two identifiers.   Consent: I discussed the limitations, risks, security and privacy concerns of performing an evaluation and management service by telephone and the availability of in person appointments. I also discussed with the patient that there may be a patient responsible charge related to this service. The patient expressed understanding and agreed to proceed.   Location of Patient: Home  Location of Provider: Clinic   Persons participating in Telemedicine visit: [name] Dr. Margarita Rana     History of Present Illness: Morgan Bean is a 55 y.o. year old female with  a history of Hypertension, paroxysmal Afib, CAD, previous CVA, moderate to severe MR, noncompliance, Deprssion, polysubstance abuse.   She gets dyspneic on mild exertion. States she still has her furosemide and has been taking it.  She has not followed up with cardiology lately and referrals have been placed but notes in epic revealed cardiology was unable to get in touch with patient.  Does not check her weight regularly. Echo from 01/2019 revealed EF of 60 to 65%, left ventricular function, moderately dilated left atrial size, mild MR, mild TR, mild to moderate aortic valve regurg.  She has not been checking her blood pressures at home but endorses compliance with her antihypertensive She also complains she never got to see ENT due to concerns of voice hoarseness but I have placed a referral for her in 08/2020. Past Medical History:  Diagnosis Date   Anemia of other chronic disease 11/13/2013   Anginal pain ([place])    none in past year   Anxiety    Asthma    patient denies   Candidiasis 06/23/2013   Chronic diastolic CHF (congestive heart failure) ([name])    Constipation    Coronary artery disease    Lexiscan myoview  (10/12) with significant ST depression upon Lexiscan injection but no ischemia or infarction on perfusion images. Left heart cath (10/12): 30% mid LAD, 30% ostial D1, 50% ostial D2.     Cough 11/13/2013   Depression    Headache 02/22/2014   Hx of cardiovascular stress test    Lexiscan Myoview (2/16): Breast attenuation artifact, no ischemia, EF 64%; low risk   Hyperlipemia    Hypertension    Iron deficiency    hx of   Leukocytoclastic vasculitis ([name])    Rash across lower body, occurred in 9/11, diagnosed by skin biopsy. ANA positive. Thought to be secondary to cocaine use.   Leukopenia 03/21/2013   Lymphadenopathy 03/21/2013   Lymphadenopathy 01/30/2014   Mental disorder    Pancytopenia ([place])    Pancytopenia, acquired ([place]) 07/16/2015   Polysubstance abuse ([place])    Prior cocaine   Pulmonary HTN ([place])    Echo (9/11) with EF 65%, mild LVH, mild AI, mild MR, moderate to possibly severe TR with PA systolic pressure 52 mmHg. Echo (10/12): severe LV hypertrophy, EF 60-65%, mild MR, mild AI, moderate to severe tricuspid regurgitation, PA systolic pressure 38 mmHg.  Echo 4/14: moderate LVH, EF 65%, normal wall motion, diastolic dysfunction, mild AI, mild MR   Shortness of breath    a. PFTs 5/14:  FEV1/FVC 99% predicted; FVC 60% predicted; DLCO mildly reduced, mild restriction, air trapping.;  b.  seen by pulmo (Dr. [name]) 5/14: mainly upper airway symptoms - ACE d/c'd (sx's better)   Stroke (  [place]) 2010ish   Syphilis 04/25/2013   Tobacco abuse    Tricuspid regurgitation    Unspecified deficiency anemia 03/29/2013   Allergies  Allergen Reactions   Ciprofloxacin Itching    Current Outpatient Medications on File Prior to Visit  Medication Sig Dispense Refill   amiodarone (PACERONE) 200 MG tablet Take 1 tablet (200 mg total) by mouth daily. 90 tablet 1   ARIPiprazole (ABILIFY) 5 MG tablet Take 5 mg by mouth daily.     atorvastatin (LIPITOR) 40 MG tablet Take 1 tablet (40 mg total) by mouth at  bedtime. 30 tablet 2   buPROPion (WELLBUTRIN SR) 150 MG 12 hr tablet Take 1 tablet (150 mg total) by mouth 2 (two) times daily. 60 tablet 2   citalopram (CELEXA) 40 MG tablet Take 1 tablet (40 mg total) by mouth daily. 30 tablet 2   cloNIDine (CATAPRES) 0.1 MG tablet Take 1 tablet (0.1 mg total) by mouth 2 (two) times daily. 60 tablet 2   diltiazem (CARDIZEM CD) 180 MG 24 hr capsule Take 1 capsule (180 mg total) by mouth daily. 60 capsule 2   ferrous sulfate 325 (65 FE) MG EC tablet Take 1 tablet (325 mg total) by mouth 3 (three) times daily with meals. 60 tablet 3   furosemide (LASIX) 40 MG tablet TAKE 1 TABLET (40 MG TOTAL) BY MOUTH 2 (TWO) TIMES DAILY. 180 tablet 2   hydrOXYzine (ATARAX/VISTARIL) 50 MG tablet Take 50 mg by mouth 2 (two) times daily as needed for anxiety.     lisinopril (ZESTRIL) 10 MG tablet Take 1 tablet (10 mg total) by mouth daily. 90 tablet 2   potassium chloride SA (KLOR-CON) 20 MEQ tablet Take 1 tablet (20 mEq total) by mouth daily. 30 tablet 1   rivaroxaban (XARELTO) 20 MG TABS tablet Take 1 tablet (20 mg total) by mouth daily with supper. 30 tablet 3   topiramate (TOPAMAX) 25 MG tablet Take 2 tablets (50 mg total) by mouth at bedtime. 60 tablet 2   traZODone (DESYREL) 150 MG tablet Take 1 tablet (150 mg total) by mouth at bedtime. 30 tablet 2   No current facility-administered medications on file prior to visit.    ROS: See HPI  Observations/Objective: Awake, alert, ranted x3 Not in acute distress Normal mood.  CMP Latest Ref Rng & Units 05/29/2020 05/28/2020 03/01/2019  Glucose 70 - 99 mg/dL 91 104(H) 93  BUN 6 - 20 mg/dL 22(H) 20 10  Creatinine 0.44 - 1.00 mg/dL 1.16(H) 1.20(H) 1.25(H)  Sodium 135 - 145 mmol/L 138 139 141  Potassium 3.5 - 5.1 mmol/L 3.2(L) 3.4(L) 4.1  Chloride 98 - 111 mmol/L 108 110 108  CO2 22 - 32 mmol/L 21(L) 21(L) 24  Calcium 8.9 - 10.3 mg/dL 8.7(L) 9.0 9.0  Total Protein 6.5 - 8.1 g/dL - 6.8 7.8  Total Bilirubin 0.3 - 1.2 mg/dL -  1.4(H) <0.1(L)  Alkaline Phos 38 - 126 U/L - 95 61  AST 15 - 41 U/L - 68(H) 14(L)  ALT 0 - 44 U/L - 53(H) 13    Lipid Panel     Component Value Date/Time   CHOL 154 08/21/2015 1212   TRIG 109 08/21/2015 1212   HDL 43 (L) 08/21/2015 1212   CHOLHDL 3.6 08/21/2015 1212   VLDL 22 08/21/2015 1212   LDLCALC 89 08/21/2015 1212   LDLDIRECT 142.1 12/25/2010 0846    Lab Results  Component Value Date   HGBA1C 5.4 08/21/2015    Assessment and Plan: 1.  Atrial flutter with rapid ventricular response ([name]) Currently on anticoagulation with Xarelto and rate control with amiodarone She needs to see cardiology for follow-up I have provided her with cardiology phone number to schedule an appointment given they tried to reach her but were unsuccessful  2. Hypertensive heart disease with chronic combined systolic and diastolic congestive heart failure ([name]) Last echo revealed normal EF Will need to evaluate for fluid overload given complains of dyspnea Labs have been ordered I will have her come into the office for evaluation Underlying substance abuse could also be playing a role Advised on fluid restriction, daily weights, report to the ED if symptoms worsen - Basic Metabolic Panel; Future - Pro b natriuretic peptide; Future  3. Hoarseness of voice Referred to ENT 3 months ago I have provided her with the ENT office on file to which she was referred so she can contact them for an appointment   Follow Up Instructions: We will see her back in 1 week   I discussed the assessment and treatment plan with the patient. The patient was provided an opportunity to ask questions and all were answered. The patient agreed with the plan and demonstrated an understanding of the instructions.   The patient was advised to call back or seek an in-person evaluation if the symptoms worsen or if the condition fails to improve as anticipated.     I provided 14 minutes total of non-face-to-face time  during this encounter.   [name], MD, FAAFP. [facility] and [facility], [place]   12/16/2020, 9:47 AM

## 2020-12-17 ENCOUNTER — Other Ambulatory Visit: Payer: Self-pay

## 2020-12-17 ENCOUNTER — Ambulatory Visit: Payer: Medicaid Other | Attending: Family Medicine

## 2020-12-17 ENCOUNTER — Encounter: Payer: Self-pay | Admitting: Family Medicine

## 2020-12-17 DIAGNOSIS — I110 Hypertensive heart disease with heart failure: Secondary | ICD-10-CM

## 2020-12-18 LAB — BASIC METABOLIC PANEL
BUN/Creatinine Ratio: 18 (ref 9–23)
BUN: 20 mg/dL (ref 6–24)
CO2: 20 mmol/L (ref 20–29)
Calcium: 8.9 mg/dL (ref 8.7–10.2)
Chloride: 106 mmol/L (ref 96–106)
Creatinine, Ser: 1.09 mg/dL — ABNORMAL HIGH (ref 0.57–1.00)
Glucose: 104 mg/dL — ABNORMAL HIGH (ref 70–99)
Potassium: 4.1 mmol/L (ref 3.5–5.2)
Sodium: 141 mmol/L (ref 134–144)
eGFR: 59 mL/min/{1.73_m2} — ABNORMAL LOW (ref >59)

## 2020-12-18 LAB — PRO B NATRIURETIC PEPTIDE: NT-Pro BNP: 2714 pg/mL — ABNORMAL HIGH (ref 0–287)

## 2020-12-19 ENCOUNTER — Telehealth: Payer: Self-pay

## 2020-12-19 NOTE — Telephone Encounter (Signed)
Attempted to contact patient yesterday - documentation of call is in result notes.     Attempted again to contact the patient today  to inform her that per Dr Parks Neptune BNP is significantly elevated which indicates she is in acute congestive heart failure.  She needs IV Lasix and will need to go to the ED.  No answer, message left for patient with call back requested

## 2020-12-24 ENCOUNTER — Encounter: Payer: Self-pay | Admitting: Family Medicine

## 2020-12-24 ENCOUNTER — Ambulatory Visit (HOSPITAL_BASED_OUTPATIENT_CLINIC_OR_DEPARTMENT_OTHER): Payer: Medicaid Other | Admitting: Family Medicine

## 2020-12-24 ENCOUNTER — Other Ambulatory Visit: Payer: Self-pay

## 2020-12-24 VITALS — BP 155/137 | HR 105 | Ht 66.0 in | Wt 162.0 lb

## 2020-12-24 DIAGNOSIS — F1910 Other psychoactive substance abuse, uncomplicated: Secondary | ICD-10-CM

## 2020-12-24 DIAGNOSIS — I5042 Chronic combined systolic (congestive) and diastolic (congestive) heart failure: Secondary | ICD-10-CM | POA: Insufficient documentation

## 2020-12-24 DIAGNOSIS — Z7901 Long term (current) use of anticoagulants: Secondary | ICD-10-CM | POA: Insufficient documentation

## 2020-12-24 DIAGNOSIS — F32A Depression, unspecified: Secondary | ICD-10-CM | POA: Insufficient documentation

## 2020-12-24 DIAGNOSIS — I5033 Acute on chronic diastolic (congestive) heart failure: Secondary | ICD-10-CM

## 2020-12-24 DIAGNOSIS — I4819 Other persistent atrial fibrillation: Secondary | ICD-10-CM

## 2020-12-24 DIAGNOSIS — Z79899 Other long term (current) drug therapy: Secondary | ICD-10-CM | POA: Insufficient documentation

## 2020-12-24 DIAGNOSIS — Z8673 Personal history of transient ischemic attack (TIA), and cerebral infarction without residual deficits: Secondary | ICD-10-CM | POA: Insufficient documentation

## 2020-12-24 DIAGNOSIS — I2510 Atherosclerotic heart disease of native coronary artery without angina pectoris: Secondary | ICD-10-CM | POA: Insufficient documentation

## 2020-12-24 DIAGNOSIS — I340 Nonrheumatic mitral (valve) insufficiency: Secondary | ICD-10-CM | POA: Insufficient documentation

## 2020-12-24 DIAGNOSIS — I110 Hypertensive heart disease with heart failure: Secondary | ICD-10-CM

## 2020-12-24 DIAGNOSIS — Z91199 Patient's noncompliance with other medical treatment and regimen due to unspecified reason: Secondary | ICD-10-CM | POA: Insufficient documentation

## 2020-12-24 DIAGNOSIS — Z881 Allergy status to other antibiotic agents status: Secondary | ICD-10-CM | POA: Insufficient documentation

## 2020-12-24 DIAGNOSIS — Z8249 Family history of ischemic heart disease and other diseases of the circulatory system: Secondary | ICD-10-CM | POA: Insufficient documentation

## 2020-12-24 NOTE — Patient Instructions (Signed)
Please go to the ED right away

## 2020-12-24 NOTE — Progress Notes (Signed)
Discuss breathing Has a cold, wants to discuss medication options.

## 2020-12-24 NOTE — Progress Notes (Signed)
Subjective:  Patient ID: Morgan Bean, female    DOB: May 01, 1965  Age: 55 y.o. MRN: [id number]  CC: Congestive Heart Failure   HPI Morgan Bean is a 55 y.o. year old female with a history of Hypertension, paroxysmal Afib, A-flutter (status post ablation) CAD, previous CVA, moderate to severe MR, noncompliance, Depression, polysubstance abuse.  Interval History: Accompanied by her daughter to today's visit. She is dyspneic at rest. After her last office visit her BNP had returned at 2,714 and had called her several times to refer her for IV diuretics and the office staff as well tried to reach her but we were unsuccessful. Her daughter moved down from [place] to assist in caring for her mother ;she states her mother has had progressively worsening dyspnea. Morgan Bean admits to significant dyspnea at rest, reduced exercise tolerance, orthopnea, PND but has not noticed any weight gain or pedal edema.  She has no chest pain.  She informs me she has been compliant with her Lasix. Patient endorses last use of cocaine was yesterday. Past Medical History:  Diagnosis Date   Anemia of other chronic disease 11/13/2013   Anginal pain ([place])    none in past year   Anxiety    Asthma    patient denies   Candidiasis 06/23/2013   Chronic diastolic CHF (congestive heart failure) ([name])    Constipation    Coronary artery disease    Lexiscan myoview (10/12) with significant ST depression upon Lexiscan injection but no ischemia or infarction on perfusion images. Left heart cath (10/12): 30% mid LAD, 30% ostial D1, 50% ostial D2.     Cough 11/13/2013   Depression    Headache 02/22/2014   Hx of cardiovascular stress test    Lexiscan Myoview (2/16): Breast attenuation artifact, no ischemia, EF 64%; low risk   Hyperlipemia    Hypertension    Iron deficiency    hx of   Leukocytoclastic vasculitis ([name])    Rash across lower body, occurred in 9/11, diagnosed by skin biopsy. ANA positive. Thought to be secondary  to cocaine use.   Leukopenia 03/21/2013   Lymphadenopathy 03/21/2013   Lymphadenopathy 01/30/2014   Mental disorder    Pancytopenia ([place])    Pancytopenia, acquired ([place]) 07/16/2015   Polysubstance abuse ([place])    Prior cocaine   Pulmonary HTN ([place])    Echo (9/11) with EF 65%, mild LVH, mild AI, mild MR, moderate to possibly severe TR with PA systolic pressure 52 mmHg. Echo (10/12): severe LV hypertrophy, EF 60-65%, mild MR, mild AI, moderate to severe tricuspid regurgitation, PA systolic pressure 38 mmHg.  Echo 4/14: moderate LVH, EF 65%, normal wall motion, diastolic dysfunction, mild AI, mild MR   Shortness of breath    a. PFTs 5/14:  FEV1/FVC 99% predicted; FVC 60% predicted; DLCO mildly reduced, mild restriction, air trapping.;  b.  seen by pulmo (Dr. [name]) 5/14: mainly upper airway symptoms - ACE d/c'd (sx's better)   Stroke [facility]) 2010ish   Syphilis 04/25/2013   Tobacco abuse    Tricuspid regurgitation    Unspecified deficiency anemia 03/29/2013    Past Surgical History:  Procedure Laterality Date   ANKLE SURGERY     right   BONE MARROW ASPIRATION     CARDIAC CATHETERIZATION     CARDIAC CATHETERIZATION N/A 04/23/2015   Procedure: Right Heart Cath;  Surgeon: [name], MD;  Location: [facility];  Service: Cardiovascular;  Laterality: N/A;   CARDIOVERSION N/A 03/11/2018   Procedure:  CARDIOVERSION;  Surgeon: [name], MD;  Location[facility] ENDOSCOPY;  Service: Cardiovascular;  Laterality: N/A;   I & D EXTREMITY  08/09/2011   Procedure: IRRIGATION AND DEBRIDEMENT EXTREMITY;  Surgeon: [name], MD;  Location: [facility];  Service: General;  Laterality: Left;  i & D Left axilla abscess   LYMPH NODE BIOPSY N/A 04/05/2013   Procedure: EXCISIONAL BIOPSY RIGHT SUBMANDIBULAR NODE, NASAL ENDOSCOPIC WITH BIOPSY NASAL PHARYNX;  Surgeon: [name], MD;  Location: [facility];  Service: ENT;  Laterality: N/A;   MULTIPLE EXTRACTIONS WITH ALVEOLOPLASTY N/A 03/03/2019   Procedure:  MULTIPLE EXTRACTION WITH ALVEOLOPLASTY;  Surgeon: [name], DDS;  Location: [facility];  Service: Oral Surgery;  Laterality: N/A;   TEE WITHOUT CARDIOVERSION N/A 03/11/2018   Procedure: TRANSESOPHAGEAL ECHOCARDIOGRAM (TEE);  Surgeon: [name], MD;  Location: [facility] ENDOSCOPY;  Service: Cardiovascular;  Laterality: N/A;    Family History  Problem Relation Age of Onset   Coronary artery disease Mother    Diabetes Mother    Diabetes Father    Hypertension Father    Coronary artery disease Father    Heart attack Father    Breast cancer Maternal Grandmother    Diabetes Maternal Grandmother    Heart attack Maternal Grandmother    Stroke Maternal Grandmother    Asthma Grandson     Allergies  Allergen Reactions   Ciprofloxacin Itching    Outpatient Medications Prior to Visit  Medication Sig Dispense Refill   amiodarone (PACERONE) 200 MG tablet Take 1 tablet (200 mg total) by mouth daily. 90 tablet 1   ARIPiprazole (ABILIFY) 5 MG tablet Take 5 mg by mouth daily.     atorvastatin (LIPITOR) 40 MG tablet Take 1 tablet (40 mg total) by mouth at bedtime. 30 tablet 2   buPROPion (WELLBUTRIN SR) 150 MG 12 hr tablet Take 1 tablet (150 mg total) by mouth 2 (two) times daily. 60 tablet 2   citalopram (CELEXA) 40 MG tablet Take 1 tablet (40 mg total) by mouth daily. 30 tablet 2   cloNIDine (CATAPRES) 0.1 MG tablet Take 1 tablet (0.1 mg total) by mouth 2 (two) times daily. 60 tablet 2   diltiazem (CARDIZEM CD) 180 MG 24 hr capsule Take 1 capsule (180 mg total) by mouth daily. 60 capsule 2   ferrous sulfate 325 (65 FE) MG EC tablet Take 1 tablet (325 mg total) by mouth 3 (three) times daily with meals. 60 tablet 3   furosemide (LASIX) 40 MG tablet TAKE 1 TABLET (40 MG TOTAL) BY MOUTH 2 (TWO) TIMES DAILY. 180 tablet 2   hydrOXYzine (ATARAX/VISTARIL) 50 MG tablet Take 50 mg by mouth 2 (two) times daily as needed for anxiety.     potassium chloride SA (KLOR-CON) 20 MEQ tablet Take 1 tablet (20 mEq  total) by mouth daily. 30 tablet 1   rivaroxaban (XARELTO) 20 MG TABS tablet Take 1 tablet (20 mg total) by mouth daily with supper. 30 tablet 3   topiramate (TOPAMAX) 25 MG tablet Take 2 tablets (50 mg total) by mouth at bedtime. 60 tablet 2   traZODone (DESYREL) 150 MG tablet Take 1 tablet (150 mg total) by mouth at bedtime. 30 tablet 2   lisinopril (ZESTRIL) 10 MG tablet Take 1 tablet (10 mg total) by mouth daily. 90 tablet 2   No facility-administered medications prior to visit.     ROS Review of Systems  Constitutional:  Negative for activity change, appetite change and fatigue.  HENT:  Negative for congestion, sinus pressure and sore throat.   Eyes:  Negative for visual disturbance.  Respiratory:  Positive for shortness of breath. Negative for cough, chest tightness and wheezing.   Cardiovascular:  Negative for chest pain and palpitations.  Gastrointestinal:  Negative for abdominal distention, abdominal pain and constipation.  Endocrine: Negative for polydipsia.  Genitourinary:  Negative for dysuria and frequency.  Musculoskeletal:  Negative for arthralgias and back pain.  Skin:  Negative for rash.  Neurological:  Negative for tremors, light-headedness and numbness.  Hematological:  Does not bruise/bleed easily.  Psychiatric/Behavioral:  Negative for agitation and behavioral problems.    Objective:  BP (!) 155/137   Pulse (!) 105   Ht 5\' 6"  (1.676 m)   Wt 162 lb (73.5 kg)   LMP 05/10/2013   SpO2 99%   BMI 26.15 kg/m   BP/Weight 12/24/2020 6/78/9381 0/03/7508  Systolic BP 258 527 782  Diastolic BP 423 536 96  Wt. (Lbs) 162 166.8 159.6  BMI 26.15 26.92 25.76  Some encounter information is confidential and restricted. Go to Review Flowsheets activity to see all data.      Physical Exam Constitutional:      General: She is in acute distress.     Appearance: She is well-developed.  Neck:     Vascular: JVD present.  Cardiovascular:     Rate and Rhythm: Normal rate.  Rhythm irregular.     Heart sounds: Normal heart sounds. No murmur heard. Pulmonary:     Effort: Pulmonary effort is normal.     Breath sounds: No wheezing or rales.     Comments: Reduced breath sounds at lung bases Chest:     Chest wall: No tenderness.  Abdominal:     General: Bowel sounds are normal. There is no distension.     Palpations: Abdomen is soft. There is no mass.     Tenderness: There is no abdominal tenderness.  Musculoskeletal:        General: Normal range of motion.     Right lower leg: No edema.     Left lower leg: No edema.  Neurological:     Mental Status: She is oriented to person, place, and time.  Psychiatric:        Mood and Affect: Mood normal.    CMP Latest Ref Rng & Units 12/17/2020 05/29/2020 05/28/2020  Glucose 70 - 99 mg/dL 104(H) 91 104(H)  BUN 6 - 24 mg/dL 20 22(H) 20  Creatinine 0.57 - 1.00 mg/dL 1.09(H) 1.16(H) 1.20(H)  Sodium 134 - 144 mmol/L 141 138 139  Potassium 3.5 - 5.2 mmol/L 4.1 3.2(L) 3.4(L)  Chloride 96 - 106 mmol/L 106 108 110  CO2 20 - 29 mmol/L 20 21(L) 21(L)  Calcium 8.7 - 10.2 mg/dL 8.9 8.7(L) 9.0  Total Protein 6.5 - 8.1 g/dL - - 6.8  Total Bilirubin 0.3 - 1.2 mg/dL - - 1.4(H)  Alkaline Phos 38 - 126 U/L - - 95  AST 15 - 41 U/L - - 68(H)  ALT 0 - 44 U/L - - 53(H)    Lipid Panel     Component Value Date/Time   CHOL 154 08/21/2015 1212   TRIG 109 08/21/2015 1212   HDL 43 (L) 08/21/2015 1212   CHOLHDL 3.6 08/21/2015 1212   VLDL 22 08/21/2015 1212   LDLCALC 89 08/21/2015 1212   LDLDIRECT 142.1 12/25/2010 0846    CBC    Component Value Date/Time   WBC 7.7 05/29/2020 0133   RBC 4.45 05/29/2020  0133   HGB 11.6 (L) 05/29/2020 0133   HGB 13.5 04/11/2018 1532   HGB 10.0 (L) 07/16/2015 1407   HCT 35.7 (L) 05/29/2020 0133   HCT 30.7 (L) 12/16/2018 2300   HCT 31.8 (L) 07/16/2015 1407   PLT 204 05/29/2020 0133   PLT 241 04/11/2018 1532   MCV 80.2 05/29/2020 0133   MCV 76 (L) 04/11/2018 1532   MCV 78.2 (L) 07/16/2015 1407    MCH 26.1 05/29/2020 0133   MCHC 32.5 05/29/2020 0133   RDW 14.5 05/29/2020 0133   RDW 16.4 (H) 04/11/2018 1532   RDW 14.9 (H) 07/16/2015 1407   LYMPHSABS 2.8 12/27/2018 1039   LYMPHSABS 2.4 11/10/2017 0928   LYMPHSABS 1.8 07/16/2015 1407   MONOABS 0.9 12/27/2018 1039   MONOABS 0.8 07/16/2015 1407   EOSABS 0.1 12/27/2018 1039   EOSABS 0.2 11/10/2017 0928   BASOSABS 0.0 12/27/2018 1039   BASOSABS 0.1 11/10/2017 0928   BASOSABS 0.0 07/16/2015 1407    Lab Results  Component Value Date   HGBA1C 5.4 08/21/2015    Assessment & Plan:  1. Hypertensive heart disease with chronic combined systolic and diastolic congestive heart failure ([name]) Blood pressure is uncontrolled Currently in acute exacerbation of CHF Last echo from 01/2019 revealed EF of 60 to 65% She will need an updated echo Has been noncompliant with cardiology follow-ups  2. Other persistent atrial fibrillation ([name]) High risk patient currently in A. fib Referred to the ED stat She has been on Xarelto  3. Acute on chronic diastolic CHF (congestive heart failure) ([name]) Severe exacerbation, dyspneic at rest BNP from 12/17/20- 2714 Will need IV diuretics, will require hospitalization Referred to ED stat  4. Substance abuse ([place]) Substance abuse superimposed on cardiac condition complicating situation Strongly counseled on quitting and we have discussed the detrimental effect of ongoing substance use    No orders of the defined types were placed in this encounter.   Follow-up: Return in about 2 weeks (around 01/07/2021) for CHF.       [name], MD, FAAFP. [facility] and [facility] [place], [place]   12/24/2020, 10:06 AM

## 2020-12-25 ENCOUNTER — Inpatient Hospital Stay (HOSPITAL_COMMUNITY): Payer: Medicaid Other

## 2020-12-25 ENCOUNTER — Emergency Department (HOSPITAL_COMMUNITY): Payer: Medicaid Other

## 2020-12-25 ENCOUNTER — Other Ambulatory Visit: Payer: Self-pay

## 2020-12-25 ENCOUNTER — Inpatient Hospital Stay (HOSPITAL_COMMUNITY)
Admission: EM | Admit: 2020-12-25 | Discharge: 2020-12-28 | DRG: 291 | Disposition: A | Discharge status: Home / Self Care | Payer: Medicaid Other | Attending: Internal Medicine | Admitting: Internal Medicine

## 2020-12-25 ENCOUNTER — Encounter (HOSPITAL_COMMUNITY): Payer: Self-pay

## 2020-12-25 DIAGNOSIS — D509 Iron deficiency anemia, unspecified: Secondary | ICD-10-CM | POA: Diagnosis present

## 2020-12-25 DIAGNOSIS — I272 Pulmonary hypertension, unspecified: Secondary | ICD-10-CM | POA: Diagnosis present

## 2020-12-25 DIAGNOSIS — R778 Other specified abnormalities of plasma proteins: Secondary | ICD-10-CM

## 2020-12-25 DIAGNOSIS — J690 Pneumonitis due to inhalation of food and vomit: Secondary | ICD-10-CM | POA: Diagnosis present

## 2020-12-25 DIAGNOSIS — R1310 Dysphagia, unspecified: Secondary | ICD-10-CM | POA: Diagnosis not present

## 2020-12-25 DIAGNOSIS — J9601 Acute respiratory failure with hypoxia: Secondary | ICD-10-CM | POA: Diagnosis present

## 2020-12-25 DIAGNOSIS — J189 Pneumonia, unspecified organism: Secondary | ICD-10-CM | POA: Diagnosis present

## 2020-12-25 DIAGNOSIS — R0682 Tachypnea, not elsewhere classified: Secondary | ICD-10-CM | POA: Diagnosis present

## 2020-12-25 DIAGNOSIS — Z803 Family history of malignant neoplasm of breast: Secondary | ICD-10-CM

## 2020-12-25 DIAGNOSIS — I160 Hypertensive urgency: Secondary | ICD-10-CM | POA: Diagnosis present

## 2020-12-25 DIAGNOSIS — Z8249 Family history of ischemic heart disease and other diseases of the circulatory system: Secondary | ICD-10-CM

## 2020-12-25 DIAGNOSIS — I5032 Chronic diastolic (congestive) heart failure: Secondary | ICD-10-CM | POA: Diagnosis not present

## 2020-12-25 DIAGNOSIS — I5033 Acute on chronic diastolic (congestive) heart failure: Secondary | ICD-10-CM | POA: Diagnosis present

## 2020-12-25 DIAGNOSIS — I4892 Unspecified atrial flutter: Secondary | ICD-10-CM | POA: Diagnosis present

## 2020-12-25 DIAGNOSIS — E876 Hypokalemia: Secondary | ICD-10-CM | POA: Diagnosis present

## 2020-12-25 DIAGNOSIS — E1122 Type 2 diabetes mellitus with diabetic chronic kidney disease: Secondary | ICD-10-CM | POA: Diagnosis present

## 2020-12-25 DIAGNOSIS — D61818 Other pancytopenia: Secondary | ICD-10-CM | POA: Diagnosis present

## 2020-12-25 DIAGNOSIS — Z823 Family history of stroke: Secondary | ICD-10-CM

## 2020-12-25 DIAGNOSIS — R490 Dysphonia: Secondary | ICD-10-CM | POA: Diagnosis present

## 2020-12-25 DIAGNOSIS — J352 Hypertrophy of adenoids: Secondary | ICD-10-CM | POA: Diagnosis present

## 2020-12-25 DIAGNOSIS — H6982 Other specified disorders of Eustachian tube, left ear: Secondary | ICD-10-CM | POA: Diagnosis present

## 2020-12-25 DIAGNOSIS — Z20822 Contact with and (suspected) exposure to covid-19: Secondary | ICD-10-CM | POA: Diagnosis present

## 2020-12-25 DIAGNOSIS — I700 Atherosclerosis of aorta: Secondary | ICD-10-CM | POA: Diagnosis present

## 2020-12-25 DIAGNOSIS — Z7901 Long term (current) use of anticoagulants: Secondary | ICD-10-CM

## 2020-12-25 DIAGNOSIS — I4891 Unspecified atrial fibrillation: Secondary | ICD-10-CM | POA: Diagnosis not present

## 2020-12-25 DIAGNOSIS — E785 Hyperlipidemia, unspecified: Secondary | ICD-10-CM | POA: Diagnosis present

## 2020-12-25 DIAGNOSIS — F1410 Cocaine abuse, uncomplicated: Secondary | ICD-10-CM | POA: Diagnosis present

## 2020-12-25 DIAGNOSIS — G4733 Obstructive sleep apnea (adult) (pediatric): Secondary | ICD-10-CM | POA: Diagnosis present

## 2020-12-25 DIAGNOSIS — I130 Hypertensive heart and chronic kidney disease with heart failure and stage 1 through stage 4 chronic kidney disease, or unspecified chronic kidney disease: Secondary | ICD-10-CM | POA: Diagnosis present

## 2020-12-25 DIAGNOSIS — I4430 Unspecified atrioventricular block: Secondary | ICD-10-CM | POA: Diagnosis present

## 2020-12-25 DIAGNOSIS — F17210 Nicotine dependence, cigarettes, uncomplicated: Secondary | ICD-10-CM | POA: Diagnosis present

## 2020-12-25 DIAGNOSIS — Z8673 Personal history of transient ischemic attack (TIA), and cerebral infarction without residual deficits: Secondary | ICD-10-CM | POA: Diagnosis not present

## 2020-12-25 DIAGNOSIS — R0602 Shortness of breath: Secondary | ICD-10-CM

## 2020-12-25 DIAGNOSIS — N1831 Chronic kidney disease, stage 3a: Secondary | ICD-10-CM | POA: Diagnosis present

## 2020-12-25 DIAGNOSIS — I480 Paroxysmal atrial fibrillation: Secondary | ICD-10-CM | POA: Diagnosis present

## 2020-12-25 DIAGNOSIS — F1411 Cocaine abuse, in remission: Secondary | ICD-10-CM | POA: Diagnosis not present

## 2020-12-25 DIAGNOSIS — Z79899 Other long term (current) drug therapy: Secondary | ICD-10-CM

## 2020-12-25 DIAGNOSIS — J449 Chronic obstructive pulmonary disease, unspecified: Secondary | ICD-10-CM | POA: Diagnosis not present

## 2020-12-25 DIAGNOSIS — R7989 Other specified abnormal findings of blood chemistry: Secondary | ICD-10-CM | POA: Diagnosis present

## 2020-12-25 DIAGNOSIS — Z882 Allergy status to sulfonamides status: Secondary | ICD-10-CM

## 2020-12-25 DIAGNOSIS — I351 Nonrheumatic aortic (valve) insufficiency: Secondary | ICD-10-CM | POA: Diagnosis not present

## 2020-12-25 DIAGNOSIS — Z833 Family history of diabetes mellitus: Secondary | ICD-10-CM

## 2020-12-25 DIAGNOSIS — J392 Other diseases of pharynx: Secondary | ICD-10-CM | POA: Diagnosis present

## 2020-12-25 DIAGNOSIS — I509 Heart failure, unspecified: Secondary | ICD-10-CM | POA: Diagnosis not present

## 2020-12-25 DIAGNOSIS — R0902 Hypoxemia: Secondary | ICD-10-CM | POA: Diagnosis present

## 2020-12-25 DIAGNOSIS — Z825 Family history of asthma and other chronic lower respiratory diseases: Secondary | ICD-10-CM

## 2020-12-25 DIAGNOSIS — I2510 Atherosclerotic heart disease of native coronary artery without angina pectoris: Secondary | ICD-10-CM | POA: Diagnosis present

## 2020-12-25 DIAGNOSIS — R1312 Dysphagia, oropharyngeal phase: Secondary | ICD-10-CM | POA: Diagnosis present

## 2020-12-25 DIAGNOSIS — I051 Rheumatic mitral insufficiency: Secondary | ICD-10-CM

## 2020-12-25 DIAGNOSIS — R0789 Other chest pain: Secondary | ICD-10-CM | POA: Diagnosis present

## 2020-12-25 DIAGNOSIS — F419 Anxiety disorder, unspecified: Secondary | ICD-10-CM | POA: Diagnosis present

## 2020-12-25 DIAGNOSIS — R000 Tachycardia, unspecified: Secondary | ICD-10-CM | POA: Diagnosis present

## 2020-12-25 DIAGNOSIS — I340 Nonrheumatic mitral (valve) insufficiency: Secondary | ICD-10-CM | POA: Diagnosis not present

## 2020-12-25 DIAGNOSIS — I513 Intracardiac thrombosis, not elsewhere classified: Secondary | ICD-10-CM | POA: Diagnosis present

## 2020-12-25 DIAGNOSIS — J4489 Other specified chronic obstructive pulmonary disease: Secondary | ICD-10-CM | POA: Diagnosis present

## 2020-12-25 LAB — BASIC METABOLIC PANEL
Anion gap: 8 (ref 5–15)
BUN: 22 mg/dL — ABNORMAL HIGH (ref 6–20)
CO2: 20 mmol/L — ABNORMAL LOW (ref 22–32)
Calcium: 8.8 mg/dL — ABNORMAL LOW (ref 8.9–10.3)
Chloride: 110 mmol/L (ref 98–111)
Creatinine, Ser: 1.19 mg/dL — ABNORMAL HIGH (ref 0.44–1.00)
GFR, Estimated: 53 mL/min — ABNORMAL LOW (ref >60)
Glucose, Bld: 108 mg/dL — ABNORMAL HIGH (ref 70–99)
Potassium: 3.8 mmol/L (ref 3.5–5.1)
Sodium: 138 mmol/L (ref 135–145)

## 2020-12-25 LAB — ECHOCARDIOGRAM COMPLETE
Area-P 1/2: 3.48 cm2
MV M vel: 5.8 m/s
MV Peak grad: 134.6 mmHg
P 1/2 time: 498 msec
S' Lateral: 3.2 cm

## 2020-12-25 LAB — URINALYSIS, ROUTINE W REFLEX MICROSCOPIC
Bilirubin Urine: NEGATIVE
Glucose, UA: NEGATIVE mg/dL
Ketones, ur: NEGATIVE mg/dL
Leukocytes,Ua: NEGATIVE
Nitrite: NEGATIVE
Protein, ur: NEGATIVE mg/dL
Specific Gravity, Urine: 1.008 (ref 1.005–1.030)
pH: 7 (ref 5.0–8.0)

## 2020-12-25 LAB — CBC WITH DIFFERENTIAL/PLATELET
Abs Immature Granulocytes: 0.01 10*3/uL (ref 0.00–0.07)
Basophils Absolute: 0 10*3/uL (ref 0.0–0.1)
Basophils Relative: 1 %
Eosinophils Absolute: 0.5 10*3/uL (ref 0.0–0.5)
Eosinophils Relative: 6 %
HCT: 37 % (ref 36.0–46.0)
Hemoglobin: 11.9 g/dL — ABNORMAL LOW (ref 12.0–15.0)
Immature Granulocytes: 0 %
Lymphocytes Relative: 30 %
Lymphs Abs: 2.2 10*3/uL (ref 0.7–4.0)
MCH: 27 pg (ref 26.0–34.0)
MCHC: 32.2 g/dL (ref 30.0–36.0)
MCV: 83.9 fL (ref 80.0–100.0)
Monocytes Absolute: 1 10*3/uL (ref 0.1–1.0)
Monocytes Relative: 14 %
Neutro Abs: 3.6 10*3/uL (ref 1.7–7.7)
Neutrophils Relative %: 49 %
Platelets: 166 10*3/uL (ref 150–400)
RBC: 4.41 MIL/uL (ref 3.87–5.11)
RDW: 15.6 % — ABNORMAL HIGH (ref 11.5–15.5)
WBC: 7.3 10*3/uL (ref 4.0–10.5)
nRBC: 0 % (ref 0.0–0.2)

## 2020-12-25 LAB — RAPID URINE DRUG SCREEN, HOSP PERFORMED
Amphetamines: NOT DETECTED
Barbiturates: NOT DETECTED
Benzodiazepines: NOT DETECTED
Cocaine: POSITIVE — AB
Opiates: NOT DETECTED
Tetrahydrocannabinol: NOT DETECTED

## 2020-12-25 LAB — HEPATIC FUNCTION PANEL
ALT: 32 U/L (ref 0–44)
AST: 28 U/L (ref 15–41)
Albumin: 3.6 g/dL (ref 3.5–5.0)
Alkaline Phosphatase: 87 U/L (ref 38–126)
Bilirubin, Direct: 0.2 mg/dL (ref 0.0–0.2)
Indirect Bilirubin: 0.9 mg/dL (ref 0.3–0.9)
Total Bilirubin: 1.1 mg/dL (ref 0.3–1.2)
Total Protein: 7.4 g/dL (ref 6.5–8.1)

## 2020-12-25 LAB — RESP PANEL BY RT-PCR (FLU A&B, COVID) ARPGX2
Influenza A by PCR: NEGATIVE
Influenza B by PCR: NEGATIVE
SARS Coronavirus 2 by RT PCR: NEGATIVE

## 2020-12-25 LAB — I-STAT BETA HCG BLOOD, ED (MC, WL, AP ONLY): I-stat hCG, quantitative: <5 m[IU]/mL (ref <5)

## 2020-12-25 LAB — POC URINE PREG, ED: Preg Test, Ur: NEGATIVE

## 2020-12-25 LAB — MAGNESIUM: Magnesium: 2.1 mg/dL (ref 1.7–2.4)

## 2020-12-25 LAB — TSH: TSH: 2.038 u[IU]/mL (ref 0.350–4.500)

## 2020-12-25 LAB — TROPONIN I (HIGH SENSITIVITY)
Troponin I (High Sensitivity): 20 ng/L — ABNORMAL HIGH (ref <18)
Troponin I (High Sensitivity): 20 ng/L — ABNORMAL HIGH (ref <18)

## 2020-12-25 LAB — BRAIN NATRIURETIC PEPTIDE: B Natriuretic Peptide: 464 pg/mL — ABNORMAL HIGH (ref 0.0–100.0)

## 2020-12-25 LAB — PROCALCITONIN: Procalcitonin: <0.1 ng/mL

## 2020-12-25 MED ORDER — ACETAMINOPHEN 325 MG PO TABS
650.0000 mg | ORAL_TABLET | ORAL | Status: DC | PRN
Start: 2020-12-25 — End: 2020-12-28
  Administered 2020-12-25: 650 mg via ORAL
  Filled 2020-12-25: qty 2

## 2020-12-25 MED ORDER — SODIUM CHLORIDE 0.9% FLUSH
3.0000 mL | Freq: Two times a day (BID) | INTRAVENOUS | Status: DC
  Administered 2020-12-25 – 2020-12-28 (×5): 3 mL via INTRAVENOUS

## 2020-12-25 MED ORDER — FUROSEMIDE 10 MG/ML IJ SOLN
40.0000 mg | Freq: Two times a day (BID) | INTRAMUSCULAR | Status: DC
  Administered 2020-12-25 – 2020-12-26 (×2): 40 mg via INTRAVENOUS
  Filled 2020-12-25 (×2): qty 4

## 2020-12-25 MED ORDER — GUAIFENESIN ER 600 MG PO TB12
600.0000 mg | ORAL_TABLET | Freq: Two times a day (BID) | ORAL | Status: DC
  Administered 2020-12-25 – 2020-12-28 (×7): 600 mg via ORAL
  Filled 2020-12-25 (×7): qty 1

## 2020-12-25 MED ORDER — RIVAROXABAN 20 MG PO TABS
20.0000 mg | ORAL_TABLET | Freq: Every day | ORAL | Status: DC
  Administered 2020-12-25 – 2020-12-27 (×3): 20 mg via ORAL
  Filled 2020-12-25 (×3): qty 1

## 2020-12-25 MED ORDER — LISINOPRIL 10 MG PO TABS
10.0000 mg | ORAL_TABLET | Freq: Every day | ORAL | Status: DC
  Administered 2020-12-25 – 2020-12-28 (×4): 10 mg via ORAL
  Filled 2020-12-25 (×4): qty 1

## 2020-12-25 MED ORDER — DILTIAZEM HCL 25 MG/5ML IV SOLN
15.0000 mg | Freq: Once | INTRAVENOUS | Status: AC
  Administered 2020-12-25: 15 mg via INTRAVENOUS
  Filled 2020-12-25: qty 5

## 2020-12-25 MED ORDER — NITROGLYCERIN 0.4 MG SL SUBL
0.4000 mg | SUBLINGUAL_TABLET | SUBLINGUAL | Status: DC | PRN
  Administered 2020-12-25: 0.4 mg via SUBLINGUAL

## 2020-12-25 MED ORDER — DILTIAZEM HCL-DEXTROSE 125-5 MG/125ML-% IV SOLN (PREMIX)
5.0000 mg/h | INTRAVENOUS | Status: DC
Start: 2020-12-25 — End: 2020-12-26
  Administered 2020-12-25: 5 mg/h via INTRAVENOUS
  Administered 2020-12-25: 12.5 mg/h via INTRAVENOUS
  Filled 2020-12-25 (×4): qty 125

## 2020-12-25 MED ORDER — SODIUM CHLORIDE 0.9 % IV SOLN
3.0000 g | Freq: Four times a day (QID) | INTRAVENOUS | Status: DC
  Administered 2020-12-25 – 2020-12-28 (×11): 3 g via INTRAVENOUS
  Filled 2020-12-25 (×13): qty 8

## 2020-12-25 MED ORDER — ATORVASTATIN CALCIUM 40 MG PO TABS
40.0000 mg | ORAL_TABLET | Freq: Every day | ORAL | Status: DC
  Administered 2020-12-25 – 2020-12-27 (×3): 40 mg via ORAL
  Filled 2020-12-25 (×3): qty 1

## 2020-12-25 MED ORDER — POTASSIUM CHLORIDE CRYS ER 20 MEQ PO TBCR
40.0000 meq | EXTENDED_RELEASE_TABLET | ORAL | Status: AC
  Administered 2020-12-25: 40 meq via ORAL
  Filled 2020-12-25: qty 2

## 2020-12-25 MED ORDER — FUROSEMIDE 10 MG/ML IJ SOLN
40.0000 mg | Freq: Once | INTRAMUSCULAR | Status: AC
  Administered 2020-12-25: 40 mg via INTRAVENOUS
  Filled 2020-12-25: qty 4

## 2020-12-25 MED ORDER — ONDANSETRON HCL 4 MG/2ML IJ SOLN
4.0000 mg | Freq: Four times a day (QID) | INTRAMUSCULAR | Status: DC | PRN
  Administered 2020-12-27: 4 mg via INTRAVENOUS
  Filled 2020-12-25: qty 2

## 2020-12-25 MED ORDER — IOHEXOL 350 MG/ML SOLN
80.0000 mL | Freq: Once | INTRAVENOUS | Status: AC | PRN
  Administered 2020-12-25: 80 mL via INTRAVENOUS

## 2020-12-25 MED ORDER — CLONIDINE HCL 0.1 MG PO TABS
0.1000 mg | ORAL_TABLET | Freq: Two times a day (BID) | ORAL | Status: DC
  Administered 2020-12-25 – 2020-12-26 (×3): 0.1 mg via ORAL
  Filled 2020-12-25 (×3): qty 1

## 2020-12-25 MED ORDER — SODIUM CHLORIDE 0.9% FLUSH: 3.0000 mL | INTRAVENOUS | Status: DC | PRN

## 2020-12-25 MED ORDER — NICOTINE 14 MG/24HR TD PT24
14.0000 mg | MEDICATED_PATCH | Freq: Every day | TRANSDERMAL | Status: DC
  Administered 2020-12-25 – 2020-12-28 (×4): 14 mg via TRANSDERMAL
  Filled 2020-12-25 (×4): qty 1

## 2020-12-25 MED ORDER — POTASSIUM CHLORIDE CRYS ER 20 MEQ PO TBCR
20.0000 meq | EXTENDED_RELEASE_TABLET | Freq: Every day | ORAL | Status: DC
  Administered 2020-12-26 – 2020-12-28 (×3): 20 meq via ORAL
  Filled 2020-12-25 (×3): qty 1

## 2020-12-25 MED ORDER — HYDRALAZINE HCL 20 MG/ML IJ SOLN
10.0000 mg | INTRAMUSCULAR | Status: DC | PRN
  Administered 2020-12-25: 10 mg via INTRAVENOUS
  Filled 2020-12-25 (×2): qty 1

## 2020-12-25 MED ORDER — TOPIRAMATE 25 MG PO TABS
50.0000 mg | ORAL_TABLET | Freq: Every day | ORAL | Status: DC
  Administered 2020-12-25 – 2020-12-27 (×3): 50 mg via ORAL
  Filled 2020-12-25 (×3): qty 2

## 2020-12-25 MED ORDER — POTASSIUM CHLORIDE CRYS ER 20 MEQ PO TBCR: 20.0000 meq | EXTENDED_RELEASE_TABLET | Freq: Every day | ORAL | Status: DC

## 2020-12-25 MED ORDER — SODIUM CHLORIDE 0.9 % IV SOLN: 250.0000 mL | INTRAVENOUS | Status: DC | PRN

## 2020-12-25 MED ORDER — MORPHINE SULFATE (PF) 2 MG/ML IV SOLN
2.0000 mg | INTRAVENOUS | Status: AC
  Administered 2020-12-25: 2 mg via INTRAVENOUS
  Filled 2020-12-25: qty 1

## 2020-12-25 MED ORDER — AMIODARONE HCL 200 MG PO TABS
200.0000 mg | ORAL_TABLET | Freq: Every day | ORAL | Status: DC
  Administered 2020-12-25 – 2020-12-28 (×4): 200 mg via ORAL
  Filled 2020-12-25 (×4): qty 1

## 2020-12-25 NOTE — Evaluation (Signed)
Clinical/Bedside Swallow Evaluation Patient Details  Name: Morgan Bean MRN: [id number] Date of Birth: February 01, 1966  Today's Date: 12/25/2020 Time: SLP Start Time (ACUTE ONLY): 1526 SLP Stop Time (ACUTE ONLY): 1548 SLP Time Calculation (min) (ACUTE ONLY): 22 min  Past Medical History:  Past Medical History:  Diagnosis Date   Anemia of other chronic disease 11/13/2013   Anginal pain ([place])    none in past year   Anxiety    Asthma    patient denies   Candidiasis 06/23/2013   Chronic diastolic CHF (congestive heart failure) ([name])    Constipation    Coronary artery disease    Lexiscan myoview (10/12) with significant ST depression upon Lexiscan injection but no ischemia or infarction on perfusion images. Left heart cath (10/12): 30% mid LAD, 30% ostial D1, 50% ostial D2.     Cough 11/13/2013   Depression    Headache 02/22/2014   Hx of cardiovascular stress test    Lexiscan Myoview (2/16): Breast attenuation artifact, no ischemia, EF 64%; low risk   Hyperlipemia    Hypertension    Iron deficiency    hx of   Leukocytoclastic vasculitis ([name])    Rash across lower body, occurred in 9/11, diagnosed by skin biopsy. ANA positive. Thought to be secondary to cocaine use.   Leukopenia 03/21/2013   Lymphadenopathy 03/21/2013   Lymphadenopathy 01/30/2014   Mental disorder    Pancytopenia ([place])    Pancytopenia, acquired ([place]) 07/16/2015   Polysubstance abuse ([place])    Prior cocaine   Pulmonary HTN ([place])    Echo (9/11) with EF 65%, mild LVH, mild AI, mild MR, moderate to possibly severe TR with PA systolic pressure 52 mmHg. Echo (10/12): severe LV hypertrophy, EF 60-65%, mild MR, mild AI, moderate to severe tricuspid regurgitation, PA systolic pressure 38 mmHg.  Echo 4/14: moderate LVH, EF 65%, normal wall motion, diastolic dysfunction, mild AI, mild MR   Shortness of breath    a. PFTs 5/14:  FEV1/FVC 99% predicted; FVC 60% predicted; DLCO mildly reduced, mild restriction, air trapping.;  b.  seen by  pulmo (Dr. [name]) 5/14: mainly upper airway symptoms - ACE d/c'd (sx's better)   Stroke [facility]) 2010ish   Syphilis 04/25/2013   Tobacco abuse    Tricuspid regurgitation    Unspecified deficiency anemia 03/29/2013   Past Surgical History:  Past Surgical History:  Procedure Laterality Date   ANKLE SURGERY     right   BONE MARROW ASPIRATION     CARDIAC CATHETERIZATION     CARDIAC CATHETERIZATION N/A 04/23/2015   Procedure: Right Heart Cath;  Surgeon: [name], MD;  Location: [place] CV LAB;  Service: Cardiovascular;  Laterality: N/A;   CARDIOVERSION N/A 03/11/2018   Procedure: CARDIOVERSION;  Surgeon: [name], MD;  Location: [facility] ENDOSCOPY;  Service: Cardiovascular;  Laterality: N/A;   I & D EXTREMITY  08/09/2011   Procedure: IRRIGATION AND DEBRIDEMENT EXTREMITY;  Surgeon: [name], MD;  Location: [place];  Service: General;  Laterality: Left;  i & D Left axilla abscess   LYMPH NODE BIOPSY N/A 04/05/2013   Procedure: EXCISIONAL BIOPSY RIGHT SUBMANDIBULAR NODE, NASAL ENDOSCOPIC WITH BIOPSY NASAL PHARYNX;  Surgeon: [name], MD;  Location: [place];  Service: ENT;  Laterality: N/A;   MULTIPLE EXTRACTIONS WITH ALVEOLOPLASTY N/A 03/03/2019   Procedure: MULTIPLE EXTRACTION WITH ALVEOLOPLASTY;  Surgeon: [name], DDS;  Location: [place];  Service: Oral Surgery;  Laterality: N/A;   TEE WITHOUT CARDIOVERSION N/A 03/11/2018  Procedure: TRANSESOPHAGEAL ECHOCARDIOGRAM (TEE);  Surgeon: [name], MD;  Location: [facility] ENDOSCOPY;  Service: Cardiovascular;  Laterality: N/A;   HPI:  Pt is a 55 y.o. female who presented with complaints of shortness of breath that had been progressively worsening two weeks prior to admission. CXR 10/5: Mild patchy infiltrate in the left mid lung. Pt reported to referring MD having intermittent difficulty swallowing and getting choked/coughing when eating. PMH: hypertension, hyperlipidemia, paroxysmal atrial fibrillation/atrial flutter on Xarelto, HFpEF, MR,  CVA, CAD, COPD, cocaine abuse, and tobacco abuse. CT scans from 2020 did note concern for pharyngitis, tonsillitis, and supraglottitis with associated masslike thickening and edema of the hypopharynx at the level of the piriformis which could reflect acute inflammation versus underlying mass. BSE 12/18/18: mild oral dysphagia with prolonged oral phase with regular texture solids; pt discharged from SLP services on 9/28 on dysphagia 3/thin. Outpatient laryngoscopy 27/0/35 wih Dr. Erik Obey: "Nasopharynx is clear. Oropharynx is clear. Hypopharynx/larynx shows thickening of the epiglottis, thickening of the right arytenoid and somewhat narrow supraglottis. Vocal cords are mobile and without lesions. Her larynx is abnormal although the pattern is difficult to identify. I wonder if this could be related to levamisole toxicity from her cocaine use."    Assessment / Plan / Recommendation  Clinical Impression  Pt was seen for bedside swallow evaluation. She reported that she has been demonstrating coughing with solids, globus sensation, and nasal regurgitation with solids and liquids. Pt stated that she has been symptomatic since 2020 and she denied any alleviating factors. She stated that she typically consumes regular texture solids and thin liquids, but eats softer solids if she is not using her dentures. Oral mechanism exam was [facility]. She was edentulous and stated that her dentures are at home. Pt was dyspneic throughout the evaluation, with intermittent worsening and relatively quick recovery and Sp02 WNL. She demonstrated coughing with thin liquids when large consecutive boluses were consumed via cup. No other s/sx of aspiration were noted. Mastication of dysphagia 3 solids was WNL and oral clearance was adequate, and she did not demonstrate symptoms of significant pharyngeal residue. A dysphagia 3 diet with thin liquid will be initiated at this time. A modified barium swallow study is recommended to further assess  physiology. SLP Visit Diagnosis: Dysphagia, unspecified (R13.10)    Aspiration Risk  Mild aspiration risk    Diet Recommendation Dysphagia 3 (Mech soft);Thin liquid   Liquid Administration via: Cup;Straw Medication Administration: Whole meds with puree Supervision: Patient able to self feed Compensations: Slow rate;Small sips/bites Postural Changes: Seated upright at 90 degrees    Other  Recommendations Oral Care Recommendations: Oral care BID    Recommendations for follow up therapy are one component of a multi-disciplinary discharge planning process, led by the attending physician.  Recommendations may be updated based on patient status, additional functional criteria and insurance authorization.  Follow up Recommendations  (TBD)      Frequency and Duration min 2x/week  2 weeks       Prognosis Prognosis for Safe Diet Advancement: Fair Barriers to Reach Goals: Time post onset      Swallow Study   General Date of Onset: 12/24/20 HPI: Pt is a 55 y.o. female who presented with complaints of shortness of breath that had been progressively worsening two weeks prior to admission. CXR 10/5: Mild patchy infiltrate in the left mid lung. Pt reported to referring MD having intermittent difficulty swallowing and getting choked/coughing when eating. PMH: hypertension, hyperlipidemia, paroxysmal atrial fibrillation/atrial flutter on Xarelto, HFpEF,  MR, CVA, CAD, COPD, cocaine abuse, and tobacco abuse. CT scans from 2020 did note concern for pharyngitis, tonsillitis, and supraglottitis with associated masslike thickening and edema of the hypopharynx at the level of the piriformis which could reflect acute inflammation versus underlying mass. BSE 12/18/18: mild oral dysphagia with prolonged oral phase with regular texture solids; pt discharged from SLP services on 9/28 on dysphagia 3/thin. Outpatient laryngoscopy 35/4/56 wih Dr. Erik Obey: "Nasopharynx is clear. Oropharynx is clear.  Hypopharynx/larynx shows thickening of the epiglottis, thickening of the right arytenoid and somewhat narrow supraglottis. Vocal cords are mobile and without lesions. Her larynx is abnormal although the pattern is difficult to identify. I wonder if this could be related to levamisole toxicity from her cocaine use." Type of Study: Bedside Swallow Evaluation Previous Swallow Assessment: see HPI Diet Prior to this Study: Regular;Thin liquids Temperature Spikes Noted: No Respiratory Status: Room air History of Recent Intubation: No Behavior/Cognition: Alert;Cooperative;Pleasant mood Oral Cavity Assessment: Within Functional Limits Oral Care Completed by SLP: No Oral Cavity - Dentition: Edentulous;Dentures, not available (dentures at home per pt) Vision: Functional for self-feeding Self-Feeding Abilities: Able to feed self Patient Positioning: Upright in bed;Postural control adequate for testing Baseline Vocal Quality: Normal Volitional Cough: Strong Volitional Swallow: Able to elicit    Oral/Motor/Sensory Function Overall Oral Motor/Sensory Function: Within functional limits   Ice Chips Ice chips: Within functional limits Presentation: Spoon   Thin Liquid Thin Liquid: Impaired Presentation: Cup;Straw Pharyngeal  Phase Impairments: Cough - Immediate    Nectar Thick Nectar Thick Liquid: Not tested   Honey Thick Honey Thick Liquid: Not tested   Puree Puree: Within functional limits Presentation: Spoon   Solid     Solid: Within functional limits Presentation: Demetrius Charity I. Hardin Negus, [place], [place] Office number [phone] Pager [phone]  Horton Marshall 12/25/2020,4:23 PM

## 2020-12-25 NOTE — Progress Notes (Addendum)
   12/25/20 1932  Assess: MEWS Score  Temp (!) 101.1 F (38.4 C)  BP (!) 150/95  Pulse Rate (!) 104  Resp (!) 28  SpO2 98 %  O2 Device Room Air  Assess: MEWS Score  MEWS Temp 1  MEWS Systolic 0  MEWS Pulse 1  MEWS RR 2  MEWS LOC 0  MEWS Score 4  MEWS Score Color Red  Assess: if the MEWS score is Yellow or Red  Were vital signs taken at a resting state? Yes  Focused Assessment No change from prior assessment  Early Detection of Sepsis Score *See Row Information* High  Treat  MEWS Interventions Administered prn meds/treatments  Pain Scale 0-10  Pain Score 0  Take Vital Signs  Increase Vital Sign Frequency  Red: Q 1hr X 4 then Q 4hr X 4, if remains red, continue Q 4hrs  Escalate  MEWS: Escalate Red: discuss with charge nurse/RN and provider, consider discussing with RRT  Notify: Charge Nurse/RN  Name of Charge Nurse/RN Notified Jaquetta RN  Date Charge Nurse/RN Notified 12/25/20  Time Charge Nurse/RN Notified 2026  Notify: Provider  Provider Name/Title Dr Tonie Griffith  Date Provider Notified 12/25/20  Time Provider Notified 2037  Notification Type Page  Notification Reason Change in status  Provider response No new orders  Date of Provider Response 12/25/20  Time of Provider Response 2044  Document  Patient Outcome Stabilized after interventions  Progress note created (see row info) Yes (gave Tylenol.)  New order-will start ANTB and repeat chest XRAY in AM

## 2020-12-25 NOTE — ED Provider Notes (Signed)
Emergency Medicine Provider Triage Evaluation Note  Morgan Bean , a 55 y.o. female  was evaluated in triage.  Pt complains of chest pain.  Saw PCP on 12/17/20 and had significantly elevated BNP (2714).  States PCP sent her here.  States worsening SOB, orthopnea, chest discomfort.  Also has URI symptoms.    Review of Systems  Positive: Chest pain, SOB Negative: fever  Physical Exam  BP (!) 186/123   Pulse (!) 138   Temp 98.7 F (37.1 C) (Oral)   Resp (!) 22   LMP 05/10/2013   SpO2 97%  Gen:   Awake, no distress   Resp:  Worsening SOB when lying back in chair, sounds wet MSK:   Moves extremities without difficulty  Other:    Medical Decision Making  Medically screening exam initiated at 2:55 AM.  Appropriate orders placed.  Haidyn Kilburg was informed that the remainder of the evaluation will be completed by another provider, this initial triage assessment does not replace that evaluation, and the importance of remaining in the ED until their evaluation is complete.  AFIB RVR.  Sounds wet on exam.  Labs sent along with covid screen.  Portable CXR ordered.  Moved to acute bed.   Larene Pickett, PA-C 12/25/20 0305    Drenda Freeze, MD 12/25/20 [phone]

## 2020-12-25 NOTE — Progress Notes (Signed)
Heart Failure Nurse Navigator Progress Note  PCP: [name], MD PCP-Cardiologist: Roni Bread., MD (NEW 01/21/21) Admission Diagnosis: CP & AF RVR Admitted from: home with siblings  Presentation:   Morgan Bean presented 10/5 with c/o chest pain. Pt interactive with interview process, resting in bed with HOB 45 on room air. Talking in broken sentences d/t SOB, SpO2 >92%. Pt is missing teeth, voice hoarse. SLP has evaluated pt this admission. Pt states she lives with her 6 adult siblings, does not have to pay bills as she does not have employment. Has been denied by disability in the past. Plans to discharge to her daughter's house. Pt states she still smokes 0.5 PPD x 39years, ready to quit. Has nicotine patch on, used chantix in the past. Smokes crack daily, last use Monday 12/23/20--ready to quit. Pt states she is taking medication as prescribed. Does not have issues with finances as siblings will help her when needed, especially with medication costs. Pt does drive, but does not have reliable vehicle, will enroll in [employer] services. Has PCP as CHW, last appt noted 12/24/20.  Explained benefits of HV TOC, agreeable. Will place appt closer to DC date, currently on cardizem gtt.  ECHO completed at bedside, pending results.   ECHO/ LVEF: pending 2020: 60-65%, mild MVR, Mild-mod AVR.   Clinical Course:  Past Medical History:  Diagnosis Date   Anemia of other chronic disease 11/13/2013   Anginal pain ([place])    none in past year   Anxiety    Asthma    patient denies   Candidiasis 06/23/2013   Chronic diastolic CHF (congestive heart failure) ([name])    Constipation    Coronary artery disease    Lexiscan myoview (10/12) with significant ST depression upon Lexiscan injection but no ischemia or infarction on perfusion images. Left heart cath (10/12): 30% mid LAD, 30% ostial D1, 50% ostial D2.     Cough 11/13/2013   Depression    Headache 02/22/2014   Hx of cardiovascular stress test     Lexiscan Myoview (2/16): Breast attenuation artifact, no ischemia, EF 64%; low risk   Hyperlipemia    Hypertension    Iron deficiency    hx of   Leukocytoclastic vasculitis ([name])    Rash across lower body, occurred in 9/11, diagnosed by skin biopsy. ANA positive. Thought to be secondary to cocaine use.   Leukopenia 03/21/2013   Lymphadenopathy 03/21/2013   Lymphadenopathy 01/30/2014   Mental disorder    Pancytopenia ([place])    Pancytopenia, acquired ([place]) 07/16/2015   Polysubstance abuse ([place])    Prior cocaine   Pulmonary HTN ([place])    Echo (9/11) with EF 65%, mild LVH, mild AI, mild MR, moderate to possibly severe TR with PA systolic pressure 52 mmHg. Echo (10/12): severe LV hypertrophy, EF 60-65%, mild MR, mild AI, moderate to severe tricuspid regurgitation, PA systolic pressure 38 mmHg.  Echo 4/14: moderate LVH, EF 65%, normal wall motion, diastolic dysfunction, mild AI, mild MR   Shortness of breath    a. PFTs 5/14:  FEV1/FVC 99% predicted; FVC 60% predicted; DLCO mildly reduced, mild restriction, air trapping.;  b.  seen by pulmo (Dr. [name]) 5/14: mainly upper airway symptoms - ACE d/c'd (sx's better)   Stroke [facility]) 2010ish   Syphilis 04/25/2013   Tobacco abuse    Tricuspid regurgitation    Unspecified deficiency anemia 03/29/2013     Social History   Socioeconomic History   Marital status: Single    Spouse name: Not  on file   Number of children: 2   Years of education: Not on file   Highest education level: GED or equivalent  Occupational History   Occupation: unemployed  Tobacco Use   Smoking status: Every Day    Packs/day: 0.50    Years: 39.00    Pack years: 19.50    Types: Cigarettes   Smokeless tobacco: Never  Vaping Use   Vaping Use: Never used  Substance and Sexual Activity   Alcohol use: Not Currently    Comment: used in the past   Drug use: Yes    Frequency: 7.0 times per week    Types: "Crack" cocaine    Comment: last use Monday 12/23/2020   Sexual  activity: Not Currently  Other Topics Concern   Not on file  Social History Narrative   Lives with mom, sisters and brothers.   Social Determinants of Health   Financial Resource Strain: Medium Risk   Difficulty of Paying Living Expenses: Somewhat hard  Food Insecurity: No Food Insecurity   Worried About Charity fundraiser in the Last Year: Never true   Arboriculturist in the Last Year: Never true  Transportation Needs: Public librarian (Medical): Yes   Lack of Transportation (Non-Medical): Yes  Physical Activity: Not on file  Stress: Not on file  Social Connections: Not on file    High Risk Criteria for Readmission and/or Poor Patient Outcomes: Heart failure hospital admissions (last 6 months): 1  No Show rate: 18% Difficult social situation: yes Demonstrates medication adherence: yes Primary Language: English Literacy level: able to read/write and comprehend. Wears glasses.   Barriers of Care:   -substance abuse -smoking  Considerations/Referrals:   Referral made to Heart Failure Pharmacist Stewardship: no Referral made to Heart Failure CSW/NCM TOC: yes, appreaciated Referral made to Heart & Vascular TOC clinic: pending.  Items for Follow-up on DC/TOC: -health coach: smoking cessation -substance cessation -HF education continued   Pricilla Holm, MSN, RN Heart Failure Nurse Navigator [phone]

## 2020-12-25 NOTE — ED Provider Notes (Signed)
[facility] EMERGENCY DEPARTMENT Provider Note   CSN: [id number] Arrival date & time: 12/25/20  0231     History Chief Complaint  Patient presents with   Chest Pain    Morgan Bean is a 55 y.o. female.  HPI     This a 55 year old female with a history of atrial fibrillation, diastolic CHF, coronary artery disease, hypertension, hyperlipidemia who presents with shortness of breath.  Patient states over the last week she has had progressively worsening shortness of breath.  She is unable to lay flat.  She states tonight she had some chest discomfort.  She is unable to describe the discomfort to me.  She has not noted any lower extremity edema.  Patient reports that she saw her primary care physician and was referred to the ED to get admitted.  Patient reports that she is compliant with her diuretic and her atrial fib medications.  She does continue to use cocaine.  She not had any fevers or cough.  Past Medical History:  Diagnosis Date   Anemia of other chronic disease 11/13/2013   Anginal pain ([place])    none in past year   Anxiety    Asthma    patient denies   Candidiasis 06/23/2013   Chronic diastolic CHF (congestive heart failure) ([name])    Constipation    Coronary artery disease    Lexiscan myoview (10/12) with significant ST depression upon Lexiscan injection but no ischemia or infarction on perfusion images. Left heart cath (10/12): 30% mid LAD, 30% ostial D1, 50% ostial D2.     Cough 11/13/2013   Depression    Headache 02/22/2014   Hx of cardiovascular stress test    Lexiscan Myoview (2/16): Breast attenuation artifact, no ischemia, EF 64%; low risk   Hyperlipemia    Hypertension    Iron deficiency    hx of   Leukocytoclastic vasculitis ([name])    Rash across lower body, occurred in 9/11, diagnosed by skin biopsy. ANA positive. Thought to be secondary to cocaine use.   Leukopenia 03/21/2013   Lymphadenopathy 03/21/2013   Lymphadenopathy 01/30/2014    Mental disorder    Pancytopenia ([place])    Pancytopenia, acquired ([place]) 07/16/2015   Polysubstance abuse ([place])    Prior cocaine   Pulmonary HTN ([place])    Echo (9/11) with EF 65%, mild LVH, mild AI, mild MR, moderate to possibly severe TR with PA systolic pressure 52 mmHg. Echo (10/12): severe LV hypertrophy, EF 60-65%, mild MR, mild AI, moderate to severe tricuspid regurgitation, PA systolic pressure 38 mmHg.  Echo 4/14: moderate LVH, EF 65%, normal wall motion, diastolic dysfunction, mild AI, mild MR   Shortness of breath    a. PFTs 5/14:  FEV1/FVC 99% predicted; FVC 60% predicted; DLCO mildly reduced, mild restriction, air trapping.;  b.  seen by pulmo (Dr. [name]) 5/14: mainly upper airway symptoms - ACE d/c'd (sx's better)   Stroke [facility]) 2010ish   Syphilis 04/25/2013   Tobacco abuse    Tricuspid regurgitation    Unspecified deficiency anemia 03/29/2013    Patient Active Problem List   Diagnosis Date Noted   Atrial flutter with rapid ventricular response ([place]) 05/28/2020   Autoimmune disease ([place]) 12/27/2018   Laryngitis, chronic 12/26/2018   Febrile neutropenia ([place]) 12/16/2018   Chronic anticoagulation 10/14/2018   CRI (chronic renal insufficiency), stage 3 (moderate) ([place]) 10/14/2018   Upper airway cough syndrome 06/30/2018   Tobacco dependence in remission 04/11/2018   Substance abuse in remission ([place]) 04/11/2018  Depression, recurrent ([place]) 04/11/2018   PAF (paroxysmal atrial fibrillation) ([place]) 03/10/2018   Acute pulmonary edema ([name])    Atrial fibrillation with RVR ([name])    Nonrheumatic mitral valve regurgitation    Acute on chronic diastolic CHF (congestive heart failure) ([place]) 03/08/2018   Hypertensive urgency 03/08/2018   Acute respiratory failure with hypoxia ([place]) 03/08/2018   Unspecified atrial flutter ([place]) 04/25/2016   Tobacco use 11/30/2015   Pancytopenia, acquired ([place]) 07/16/2015   Head and neck lymphadenopathy 07/16/2015   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 05/23/18  05/08/2015   Absolute anemia 02/25/2015   Preventive measure 12/27/2014   Primary stabbing headache 12/26/2014   History of stroke 12/26/2014   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   Headache 02/22/2014   Dental caries 01/09/2014   Cough 11/13/2013   Cigarette smoker 11/13/2013   Swelling of gums 11/13/2013   Fatigue 11/10/2013   Intracranial atherosclerosis 11/10/2013   Hyperreflexia 10/04/2013   TIA (transient ischemic attack) 10/04/2013   Fibroid uterus 09/14/2012   Perimenopause 09/14/2012   Chronic cough 08/12/2012   DOE (dyspnea on exertion) 08/12/2012   History of CVA (cerebrovascular accident) 08/09/2012   Chest pain, mid sternal 04/07/2012   Pulmonary hypertension ([place]) 04/06/2012   Substance abuse ([place]) 04/06/2012   History of cocaine abuse ([place]) 11/16/2011   Major depressive disorder, recurrent episode with psychotic features. 11/15/2011    Class: Acute   Chronic diastolic heart failure ([place]) 02/24/2011   CAD (coronary artery disease) 02/24/2011   Hyperlipidemia 01/06/2011   Essential hypertension 12/15/2010   Exertional dyspnea 12/15/2010   Leukocytoclastic vasculitis ([place]) 03/24/2007    Past Surgical History:  Procedure Laterality Date   ANKLE SURGERY     right   BONE MARROW ASPIRATION     CARDIAC CATHETERIZATION     CARDIAC CATHETERIZATION N/A 04/23/2015   Procedure: Right Heart Cath;  Surgeon: [name], MD;  Location: [facility] CV LAB;  Service: Cardiovascular;  Laterality: N/A;   CARDIOVERSION N/A 03/11/2018   Procedure: CARDIOVERSION;  Surgeon: [name], MD;  Location[facility] ENDOSCOPY;  Service: Cardiovascular;  Laterality: N/A;   I & D EXTREMITY  08/09/2011   Procedure: IRRIGATION AND DEBRIDEMENT EXTREMITY;  Surgeon: [name], MD;  Location: [facility];  Service: General;  Laterality: Left;  i & D Left axilla abscess   LYMPH NODE BIOPSY N/A 04/05/2013   Procedure: EXCISIONAL BIOPSY RIGHT SUBMANDIBULAR NODE, NASAL  ENDOSCOPIC WITH BIOPSY NASAL PHARYNX;  Surgeon: [name], MD;  Location: [facility];  Service: ENT;  Laterality: N/A;   MULTIPLE EXTRACTIONS WITH ALVEOLOPLASTY N/A 03/03/2019   Procedure: MULTIPLE EXTRACTION WITH ALVEOLOPLASTY;  Surgeon: [name], DDS;  Location: [facility];  Service: Oral Surgery;  Laterality: N/A;   TEE WITHOUT CARDIOVERSION N/A 03/11/2018   Procedure: TRANSESOPHAGEAL ECHOCARDIOGRAM (TEE);  Surgeon: [name], MD;  Location: [facility] ENDOSCOPY;  Service: Cardiovascular;  Laterality: N/A;     OB History     Gravida  4   Para  2   Term  2   Preterm      AB  2   Living  2      SAB      IAB  2   Ectopic      Multiple      Live Births              Family History  Problem Relation Age of Onset   Coronary artery disease Mother  Diabetes Mother    Diabetes Father    Hypertension Father    Coronary artery disease Father    Heart attack Father    Breast cancer Maternal Grandmother    Diabetes Maternal Grandmother    Heart attack Maternal Grandmother    Stroke Maternal Grandmother    Asthma Grandson     Social History   Tobacco Use   Smoking status: Every Day    Packs/day: 0.50    Years: 30.00    Pack years: 15.00    Types: Cigarettes   Smokeless tobacco: Never  Vaping Use   Vaping Use: Never used  Substance Use Topics   Alcohol use: No    Alcohol/week: 0.0 standard drinks    Comment: used in the past   Drug use: Yes    Frequency: 1.0 times per week    Types: "Crack" cocaine    Home Medications Prior to Admission medications   Medication Sig Start Date End Date Taking? Authorizing Provider  amiodarone (PACERONE) 200 MG tablet Take 1 tablet (200 mg total) by mouth daily. 06/29/19  Yes Kilroy, Luke K, PA-C  ARIPiprazole (ABILIFY) 5 MG tablet Take 5 mg by mouth daily.   Yes [provider]  atorvastatin (LIPITOR) 40 MG tablet Take 1 tablet (40 mg total) by mouth at bedtime. 09/18/20  Yes [name], MD  buPROPion  (WELLBUTRIN SR) 150 MG 12 hr tablet Take 1 tablet (150 mg total) by mouth 2 (two) times daily. 12/28/18  Yes [name], MD  citalopram (CELEXA) 40 MG tablet Take 1 tablet (40 mg total) by mouth daily. 12/28/18  Yes [name], MD  cloNIDine (CATAPRES) 0.1 MG tablet Take 1 tablet (0.1 mg total) by mouth 2 (two) times daily. 09/18/20  Yes [name], MD  diltiazem (CARDIZEM CD) 180 MG 24 hr capsule Take 1 capsule (180 mg total) by mouth daily. 09/18/20  Yes [name], MD  ferrous sulfate 325 (65 FE) MG EC tablet Take 1 tablet (325 mg total) by mouth 3 (three) times daily with meals. 12/28/18  Yes Newlin, Charlane Ferretti, MD  furosemide (LASIX) 40 MG tablet TAKE 1 TABLET (40 MG TOTAL) BY MOUTH 2 (TWO) TIMES DAILY. 04/19/19  Yes [name], MD  hydrOXYzine (ATARAX/VISTARIL) 50 MG tablet Take 50 mg by mouth 3 (three) times daily.   Yes [provider]  lisinopril (ZESTRIL) 10 MG tablet Take 1 tablet (10 mg total) by mouth daily. 09/18/20 12/25/20 Yes [name], MD  potassium chloride SA (KLOR-CON) 20 MEQ tablet Take 1 tablet (20 mEq total) by mouth daily. 07/10/20 01/16/21 Yes McClung, Dionne Bucy, PA-C  rivaroxaban (XARELTO) 20 MG TABS tablet Take 1 tablet (20 mg total) by mouth daily with supper. 09/18/20  Yes [name], MD  topiramate (TOPAMAX) 25 MG tablet Take 2 tablets (50 mg total) by mouth at bedtime. 12/28/18  Yes [name], MD  traZODone (DESYREL) 150 MG tablet Take 1 tablet (150 mg total) by mouth at bedtime. 12/28/18  Yes [name], MD    Allergies    Ciprofloxacin  Review of Systems   Review of Systems  Constitutional:  Negative for fever.  Respiratory:  Positive for shortness of breath. Negative for cough.   Cardiovascular:  Positive for chest pain and leg swelling.  Gastrointestinal:  Negative for abdominal pain, nausea and vomiting.  Genitourinary:  Negative for dysuria.  All other systems reviewed and are negative.  Physical Exam Updated  Vital Signs BP (!) 149/126   Pulse (!) 113  Temp 98.7 F (37.1 C) (Oral)   Resp (!) 32   LMP 05/10/2013   SpO2 94%   Physical Exam Vitals and nursing note reviewed.  Constitutional:      Comments: Chronically ill-appearing, no acute distress  HENT:     Head: Normocephalic and atraumatic.  Eyes:     Pupils: Pupils are equal, round, and reactive to light.  Neck:     Vascular: JVD present.  Cardiovascular:     Rate and Rhythm: Tachycardia present. Rhythm irregular.     Heart sounds: Normal heart sounds.  Pulmonary:     Effort: Pulmonary effort is normal. No respiratory distress.     Breath sounds: No wheezing.     Comments: Crackles in the bilateral bases, no overt respiratory distress, no tachypnea noted Abdominal:     General: Bowel sounds are normal.     Palpations: Abdomen is soft.  Musculoskeletal:     Cervical back: Neck supple.     Right lower leg: No edema.     Left lower leg: No edema.  Skin:    General: Skin is warm and dry.  Neurological:     Mental Status: She is alert and oriented to person, place, and time.  Psychiatric:        Mood and Affect: Mood normal.    ED Results / Procedures / Treatments   Labs (all labs ordered are listed, but only abnormal results are displayed) Labs Reviewed  CBC WITH DIFFERENTIAL/PLATELET - Abnormal; Notable for the following components:      Result Value   Hemoglobin 11.9 (*)    RDW 15.6 (*)    All other components within normal limits  BASIC METABOLIC PANEL - Abnormal; Notable for the following components:   CO2 20 (*)    Glucose, Bld 108 (*)    BUN 22 (*)    Creatinine, Ser 1.19 (*)    Calcium 8.8 (*)    GFR, Estimated 53 (*)    All other components within normal limits  BRAIN NATRIURETIC PEPTIDE - Abnormal; Notable for the following components:   B Natriuretic Peptide 464.0 (*)    All other components within normal limits  TROPONIN I (HIGH SENSITIVITY) - Abnormal; Notable for the following components:    Troponin I (High Sensitivity) 20 (*)    All other components within normal limits  RESP PANEL BY RT-PCR (FLU A&B, COVID) ARPGX2  MAGNESIUM  I-STAT BETA HCG BLOOD, ED ([facility], [facility], [facility] ONLY)  TROPONIN I (HIGH SENSITIVITY)    EKG EKG Interpretation  Date/Time:  Wednesday December 25 2020 02:51:49 EDT Ventricular Rate:  110 PR Interval:    QRS Duration: 82 QT Interval:  356 QTC Calculation: 481 R Axis:   45 Text Interpretation: Atrial flutter with variable A-V block Anterior infarct , age undetermined Abnormal ECG Confirmed by Thayer Jew ([id number]) on 12/25/2020 5:47:16 AM  Radiology DG Chest Port 1 View  Result Date: 12/25/2020 CLINICAL DATA:  Shortness of breath chest pain for 1 week EXAM: PORTABLE CHEST 1 VIEW COMPARISON:  05/28/2020 FINDINGS: Cardiac shadow remains enlarged. The lungs are well aerated bilaterally. Patchy infiltrate is noted in the left mid lung. No sizable effusion is noted. No bony abnormality is seen. IMPRESSION: Mild patchy infiltrate in the left mid lung. Electronically Signed   By: Inez Catalina M.D.   On: 12/25/2020 03:23    Procedures .Critical Care Performed by: Merryl Hacker, MD Authorized by: Merryl Hacker, MD   Critical care provider statement:  Critical care time (minutes):  50   Critical care was necessary to treat or prevent imminent or life-threatening deterioration of the following conditions:  Cardiac failure   Critical care was time spent personally by me on the following activities:  Discussions with consultants, evaluation of patient's response to treatment, examination of patient, ordering and performing treatments and interventions, ordering and review of laboratory studies, ordering and review of radiographic studies, pulse oximetry, re-evaluation of patient's condition, obtaining history from patient or surrogate and review of old charts   Medications Ordered in ED Medications  diltiazem (CARDIZEM) 125 mg in dextrose 5% 125 mL (1  mg/mL) infusion (has no administration in time range)  furosemide (LASIX) injection 40 mg (40 mg Intravenous Given 12/25/20 0536)  diltiazem (CARDIZEM) injection 15 mg (15 mg Intravenous Given 12/25/20 0540)    ED Course  I have reviewed the triage vital signs and the nursing notes.  Pertinent labs & imaging results that were available during my care of the patient were reviewed by me and considered in my medical decision making (see chart for details).    MDM Rules/Calculators/A&P                           Patient presents with chest pain and shortness of breath.  Concern for volume overload from PCPs office.  She reports compliance with medication but has a history of noncompliance and continues to use cocaine.  Initial vital signs notable for heart rate of 138 and a blood pressure of 186/123.  EKG shows atrial fibrillation/flutter with RVR.  No evidence of acute ischemia or arrhythmia.  Troponin at baseline is elevated and is stable at 20.  Doubt primary ACS.  She is not overtly volume overloaded on exam although her history is certainly convincing for some pulmonary edema.  Chest x-ray just shows mild patchy infiltrate.  BNP elevated in the 400s.  I have reviewed her outpatient primary care notes.  Concern for need for IV diuresis.  Here her heart rates have been in the 120-130 range.  She was given IV diltiazem and started on a drip.  This will also help with her blood pressure.  Could have some element of hypertensive urgency or emergency as well.  She was given 40 mg of IV Lasix.  We will plan for admission for rate control and diuresis. This patients CHA2DS2-VASc Score and unadjusted Ischemic Stroke Rate (% per year) is equal to 4.8 % stroke rate/year from a score of 4  Above score calculated as 1 point each if present [CHF, HTN, DM, Vascular=MI/PAD/Aortic Plaque, Age if 65-74, or Female] Above score calculated as 2 points each if present [Age > 75, or Stroke/TIA/TE] 4   Final Clinical  Impression(s) / ED Diagnoses Final diagnoses:  Atrial fibrillation with RVR ([place])  Acute on chronic congestive heart failure, unspecified heart failure type [facility])    Rx / DC Orders ED Discharge Orders          Ordered    Amb referral to AFIB Clinic        12/25/20 0548             Merryl Hacker, MD 12/25/20 [phone]

## 2020-12-25 NOTE — ED Notes (Signed)
Lunch ordered 

## 2020-12-25 NOTE — Progress Notes (Signed)
Notified by RN that pt has tachypnea of RR of 26-30. Had fever around shift change to 101.4 F.  Was admitted with CHF exacerbation with possible aspiration pneumonia.  Reviewed H&P and notes.  Started empiric antibiotics with Unasyn with concern for aspiration pneumonia and now developed fever.  Check CXR in am.

## 2020-12-25 NOTE — ED Notes (Signed)
Report given to Thereasa Distance, RN of [phone]

## 2020-12-25 NOTE — H&P (Signed)
History and Physical    Morgan Bean TIW:[id number] DOB: 04-30-1965 DOA: 12/25/2020  Referring MD/NP/PA: Kristopher Oppenheim, DO PCP: [name], MD  Patient coming from: From PCP of  Chief Complaint: Shortness of breath and chest  I have personally briefly reviewed patient's old medical records in [place]   HPI: Morgan Bean is a 56 y.o. female with medical history significant of hypertension, hyperlipidemia, paroxysmal atrial fibrillation/atrial flutter on Xarelto, HFpEF, MR, CVA, CAD, COPD, cocaine abuse, and tobacco abuse presents with complaints of shortness of breath that had progressively worsened over the last couple weeks.  With any activity she reports having to stop and rest to catch her breath.  Recently symptoms worsen to the point that she is unable to walk a few feet to the bathroom.  She complains of having left-sided chest pain that waxes and wanes and intensity of the last few days.  Notes associated symptoms of palpitations, productive cough, orthopnea, anxiety, and intermittent epigastric discomfort with bloating.  She denies having any recent fever, nausea, vomiting, diarrhea, leg swelling, or  calf pain.  Since she was previously admitted into the hospital back in March of this year she reports that she has been taking all of her medications as prescribed including Xarelto.  However, patient does not always take it with a full meal.  Due to her symptoms she had previously followed up with her PCP on 9/26 by phone visit and had blood work done which revealed her pro BNP was elevated at 2714.  The office had been trying to get in contact with her over the last week, but were unable to get a hold of her due to her having issues with her phone service.  She admits that she still smokes half pack cigarettes per day on average and last used cocaine 2 days ago.  Upon requestioning patient admits to also having difficulty swallowing for which she sometimes intermittently gets choked up  when eating foods that causes her to cough.  Her voice has been hoarse and she had previously been referred to ENT, but had not made follow-up appointment.  Review of records of CT scans from back in 2020 did note concern for pharyngitis, tonsillitis, and supraglottitis with associated masslike thickening and edema of the hypopharynx at the level of the piriformis which could reflect acute inflammation versus underlying mass.  Her daughter is present at bedside at this time and reports that she has moved from [place] with plans on her mother moving in with her and her taking over her medical care.  She states she does now have a formal appointment with Dr.Harding scheduled for November 4 at 2 PM.   ED Course: On admission into the emergency department patient was noted to be afebrile, pulse 92-1 38, respirations 15-35, blood pressures elevated up to 201/137, and O2 saturations currently maintained on room air.  Labs significant for hemoglobin 11.9, BUN 22, creatinine 1.19, BNP 464, high-sensitivity and troponin 20.  UDS was positive for cocaine.  Chest x-ray noted mild patchy infiltrate of the left midlung.  Urinalysis was negative.  Influenza and COVID-19 screening was negative.  Patient had been given Lasix 40 mg IV, Cardizem 15 mg IV, and then started on a Cardizem drip.  Review of Systems  Constitutional:  Negative for fever.  HENT:  Negative for nosebleeds.        Positive for hoarse voice  Eyes:  Negative for pain.  Respiratory:  Positive for cough, sputum production and shortness  of breath.   Cardiovascular:  Positive for chest pain. Negative for leg swelling.  Gastrointestinal:  Positive for abdominal pain. Negative for diarrhea, nausea and vomiting.  Genitourinary:  Negative for dysuria and frequency.  Musculoskeletal:  Positive for neck pain. Negative for falls.  Skin:  Negative for rash.  Neurological:  Negative for focal weakness and loss of consciousness.  Endo/Heme/Allergies:  Does  not bruise/bleed easily.  Psychiatric/Behavioral:  Positive for substance abuse. The patient is nervous/anxious.    Past Medical History:  Diagnosis Date   Anemia of other chronic disease 11/13/2013   Anginal pain ([place])    none in past year   Anxiety    Asthma    patient denies   Candidiasis 06/23/2013   Chronic diastolic CHF (congestive heart failure) ([name])    Constipation    Coronary artery disease    Lexiscan myoview (10/12) with significant ST depression upon Lexiscan injection but no ischemia or infarction on perfusion images. Left heart cath (10/12): 30% mid LAD, 30% ostial D1, 50% ostial D2.     Cough 11/13/2013   Depression    Headache 02/22/2014   Hx of cardiovascular stress test    Lexiscan Myoview (2/16): Breast attenuation artifact, no ischemia, EF 64%; low risk   Hyperlipemia    Hypertension    Iron deficiency    hx of   Leukocytoclastic vasculitis ([name])    Rash across lower body, occurred in 9/11, diagnosed by skin biopsy. ANA positive. Thought to be secondary to cocaine use.   Leukopenia 03/21/2013   Lymphadenopathy 03/21/2013   Lymphadenopathy 01/30/2014   Mental disorder    Pancytopenia ([place])    Pancytopenia, acquired ([place]) 07/16/2015   Polysubstance abuse ([place])    Prior cocaine   Pulmonary HTN ([place])    Echo (9/11) with EF 65%, mild LVH, mild AI, mild MR, moderate to possibly severe TR with PA systolic pressure 52 mmHg. Echo (10/12): severe LV hypertrophy, EF 60-65%, mild MR, mild AI, moderate to severe tricuspid regurgitation, PA systolic pressure 38 mmHg.  Echo 4/14: moderate LVH, EF 65%, normal wall motion, diastolic dysfunction, mild AI, mild MR   Shortness of breath    a. PFTs 5/14:  FEV1/FVC 99% predicted; FVC 60% predicted; DLCO mildly reduced, mild restriction, air trapping.;  b.  seen by pulmo (Dr. [name]) 5/14: mainly upper airway symptoms - ACE d/c'd (sx's better)   Stroke [facility]) 2010ish   Syphilis 04/25/2013   Tobacco abuse    Tricuspid regurgitation     Unspecified deficiency anemia 03/29/2013    Past Surgical History:  Procedure Laterality Date   ANKLE SURGERY     right   BONE MARROW ASPIRATION     CARDIAC CATHETERIZATION     CARDIAC CATHETERIZATION N/A 04/23/2015   Procedure: Right Heart Cath;  Surgeon: [name], MD;  Location: [facility];  Service: Cardiovascular;  Laterality: N/A;   CARDIOVERSION N/A 03/11/2018   Procedure: CARDIOVERSION;  Surgeon: [name], MD;  Location: [facility];  Service: Cardiovascular;  Laterality: N/A;   I & D EXTREMITY  08/09/2011   Procedure: IRRIGATION AND DEBRIDEMENT EXTREMITY;  Surgeon: [name], MD;  Location: [facility];  Service: General;  Laterality: Left;  i & D Left axilla abscess   LYMPH NODE BIOPSY N/A 04/05/2013   Procedure: EXCISIONAL BIOPSY RIGHT SUBMANDIBULAR NODE, NASAL ENDOSCOPIC WITH BIOPSY NASAL PHARYNX;  Surgeon: [name], MD;  Location: [facility];  Service: ENT;  Laterality: N/A;   MULTIPLE EXTRACTIONS  WITH ALVEOLOPLASTY N/A 03/03/2019   Procedure: MULTIPLE EXTRACTION WITH ALVEOLOPLASTY;  Surgeon: [name], DDS;  Location: [facility];  Service: Oral Surgery;  Laterality: N/A;   TEE WITHOUT CARDIOVERSION N/A 03/11/2018   Procedure: TRANSESOPHAGEAL ECHOCARDIOGRAM (TEE);  Surgeon: [name], MD;  Location[facility] ENDOSCOPY;  Service: Cardiovascular;  Laterality: N/A;     reports that she has been smoking cigarettes. She has a 15.00 pack-year smoking history. She has never used smokeless tobacco. She reports current drug use. Frequency: 1.00 time per week. Drug: "Crack" cocaine. She reports that she does not drink alcohol.  Allergies  Allergen Reactions   Ciprofloxacin Itching    Family History  Problem Relation Age of Onset   Coronary artery disease Mother    Diabetes Mother    Diabetes Father    Hypertension Father    Coronary artery disease Father    Heart attack Father    Breast cancer Maternal Grandmother    Diabetes Maternal Grandmother    Heart attack  Maternal Grandmother    Stroke Maternal Grandmother    Asthma Grandson     Prior to Admission medications   Medication Sig Start Date End Date Taking? Authorizing Provider  amiodarone (PACERONE) 200 MG tablet Take 1 tablet (200 mg total) by mouth daily. 06/29/19  Yes Kilroy, Luke K, PA-C  ARIPiprazole (ABILIFY) 5 MG tablet Take 5 mg by mouth daily.   Yes [provider]  atorvastatin (LIPITOR) 40 MG tablet Take 1 tablet (40 mg total) by mouth at bedtime. 09/18/20  Yes [name], MD  buPROPion (WELLBUTRIN SR) 150 MG 12 hr tablet Take 1 tablet (150 mg total) by mouth 2 (two) times daily. 12/28/18  Yes [name], MD  citalopram (CELEXA) 40 MG tablet Take 1 tablet (40 mg total) by mouth daily. 12/28/18  Yes [name], MD  cloNIDine (CATAPRES) 0.1 MG tablet Take 1 tablet (0.1 mg total) by mouth 2 (two) times daily. 09/18/20  Yes [name], MD  diltiazem (CARDIZEM CD) 180 MG 24 hr capsule Take 1 capsule (180 mg total) by mouth daily. 09/18/20  Yes [name], MD  ferrous sulfate 325 (65 FE) MG EC tablet Take 1 tablet (325 mg total) by mouth 3 (three) times daily with meals. 12/28/18  Yes Newlin, Charlane Ferretti, MD  furosemide (LASIX) 40 MG tablet TAKE 1 TABLET (40 MG TOTAL) BY MOUTH 2 (TWO) TIMES DAILY. 04/19/19  Yes [name], MD  hydrOXYzine (ATARAX/VISTARIL) 50 MG tablet Take 50 mg by mouth 3 (three) times daily.   Yes [provider]  lisinopril (ZESTRIL) 10 MG tablet Take 1 tablet (10 mg total) by mouth daily. 09/18/20 12/25/20 Yes [name], MD  potassium chloride SA (KLOR-CON) 20 MEQ tablet Take 1 tablet (20 mEq total) by mouth daily. 07/10/20 01/16/21 Yes McClung, Dionne Bucy, PA-C  rivaroxaban (XARELTO) 20 MG TABS tablet Take 1 tablet (20 mg total) by mouth daily with supper. 09/18/20  Yes [name], MD  topiramate (TOPAMAX) 25 MG tablet Take 2 tablets (50 mg total) by mouth at bedtime. 12/28/18  Yes [name], MD  traZODone (DESYREL) 150 MG  tablet Take 1 tablet (150 mg total) by mouth at bedtime. 12/28/18  Yes [name], MD    Physical Exam:  Constitutional: Older female who appears to be in distress Vitals:   12/25/20 0615 12/25/20 0630 12/25/20 0645 12/25/20 0700  BP: (!) 178/144 (!) 170/117 (!) 196/121 (!) 200/141  Pulse: (!) 108 92 97 (!) 101  Resp: (!) 35 (!)  22 (!) 27 (!) 28  Temp:      TempSrc:      SpO2: 95% 98% 98% 97%   Eyes: PERRL, lids and conjunctivae normal ENMT: Mucous membranes are moist. Posterior pharynx clear of any exudate or lesions.  Edentulous Neck: normal, no significant lymphadenopathy appreciated at this time.  No stridor.  JVD noted halfway to the angle of the jaw Respiratory: Decreased aeration with no significant wheezes appreciated at this time.  Patient able to maintain O2 saturations on room air. Cardiovascular: Irregular irregular, no murmurs / rubs / gallops. No extremity edema. 2+ pedal pulses. No carotid bruits.  Abdomen: no tenderness, no masses palpated. No hepatosplenomegaly. Bowel sounds positive.  Musculoskeletal: no clubbing / cyanosis. No joint deformity upper and lower extremities. Good ROM, no contractures. Normal muscle tone.  Skin: no rashes, lesions, ulcers. No induration Neurologic: CN 2-12 grossly intact. Sensation intact, DTR normal. Strength 5/5 in all 4.  Psychiatric: Alert and oriented x 3.  Anxious mood.     Labs on Admission: I have personally reviewed following labs and imaging studies  CBC: Recent Labs  Lab 12/25/20 0305  WBC 7.3  NEUTROABS 3.6  HGB 11.9*  HCT 37.0  MCV 83.9  PLT 779   Basic Metabolic Panel: Recent Labs  Lab 12/25/20 0305  NA 138  K 3.8  CL 110  CO2 20*  GLUCOSE 108*  BUN 22*  CREATININE 1.19*  CALCIUM 8.8*   GFR: Estimated Creatinine Clearance: 53.5 mL/min (A) (by C-G formula based on SCr of 1.19 mg/dL (H)). Liver Function Tests: No results for input(s): AST, ALT, ALKPHOS, BILITOT, PROT, ALBUMIN in the last 168  hours. No results for input(s): LIPASE, AMYLASE in the last 168 hours. No results for input(s): AMMONIA in the last 168 hours. Coagulation Profile: No results for input(s): INR, PROTIME in the last 168 hours. Cardiac Enzymes: No results for input(s): CKTOTAL, CKMB, CKMBINDEX, TROPONINI in the last 168 hours. BNP (last 3 results) Recent Labs    12/17/20 1044  PROBNP 2,714*   HbA1C: No results for input(s): HGBA1C in the last 72 hours. CBG: No results for input(s): GLUCAP in the last 168 hours. Lipid Profile: No results for input(s): CHOL, HDL, LDLCALC, TRIG, CHOLHDL, LDLDIRECT in the last 72 hours. Thyroid Function Tests: No results for input(s): TSH, T4TOTAL, FREET4, T3FREE, THYROIDAB in the last 72 hours. Anemia Panel: No results for input(s): VITAMINB12, FOLATE, FERRITIN, TIBC, IRON, RETICCTPCT in the last 72 hours. Urine analysis:    Component Value Date/Time   COLORURINE AMBER (A) 12/16/2018 2033   APPEARANCEUR HAZY (A) 12/16/2018 2033   LABSPEC 1.024 12/16/2018 2033   PHURINE 5.0 12/16/2018 2033   GLUCOSEU NEGATIVE 12/16/2018 2033   HGBUR MODERATE (A) 12/16/2018 2033   BILIRUBINUR NEGATIVE 12/16/2018 2033   BILIRUBINUR negative 06/29/2018 0928   BILIRUBINUR NEG 04/23/2014 1245   KETONESUR 5 (A) 12/16/2018 2033   PROTEINUR 100 (A) 12/16/2018 2033   UROBILINOGEN 0.2 06/29/2018 0928   UROBILINOGEN 0.2 09/20/2013 1805   NITRITE NEGATIVE 12/16/2018 2033   LEUKOCYTESUR NEGATIVE 12/16/2018 2033   Sepsis Labs: Recent Results (from the past 240 hour(s))  Resp Panel by RT-PCR (Flu A&B, Covid) Nasopharyngeal Swab     Status: None   Collection Time: 12/25/20  3:05 AM   Specimen: Nasopharyngeal Swab; Nasopharyngeal(NP) swabs in vial transport medium  Result Value Ref Range Status   SARS Coronavirus 2 by RT PCR NEGATIVE NEGATIVE Final    Comment: (NOTE) SARS-CoV-2 target nucleic acids are  NOT DETECTED.  The SARS-CoV-2 RNA is generally detectable in upper  respiratory specimens during the acute phase of infection. The lowest concentration of SARS-CoV-2 viral copies this assay can detect is 138 copies/mL. A negative result does not preclude SARS-Cov-2 infection and should not be used as the sole basis for treatment or other patient management decisions. A negative result may occur with  improper specimen collection/handling, submission of specimen other than nasopharyngeal swab, presence of viral mutation(s) within the areas targeted by this assay, and inadequate number of viral copies(<138 copies/mL). A negative result must be combined with clinical observations, patient history, and epidemiological information. The expected result is Negative.  Fact Sheet for Patients:  EntrepreneurPulse.com.au  Fact Sheet for Healthcare Providers:  IncredibleEmployment.be  This test is no t yet approved or cleared by the [place] FDA and  has been authorized for detection and/or diagnosis of SARS-CoV-2 by FDA under an Emergency Use Authorization (EUA). This EUA will remain  in effect (meaning this test can be used) for the duration of the COVID-19 declaration under Section 564(b)(1) of the Act, 21 U.S.C.section 360bbb-3(b)(1), unless the authorization is terminated  or revoked sooner.       Influenza A by PCR NEGATIVE NEGATIVE Final   Influenza B by PCR NEGATIVE NEGATIVE Final    Comment: (NOTE) The Xpert Xpress SARS-CoV-2/FLU/RSV plus assay is intended as an aid in the diagnosis of influenza from Nasopharyngeal swab specimens and should not be used as a sole basis for treatment. Nasal washings and aspirates are unacceptable for Xpert Xpress SARS-CoV-2/FLU/RSV testing.  Fact Sheet for Patients: EntrepreneurPulse.com.au  Fact Sheet for Healthcare Providers: IncredibleEmployment.be  This test is not yet approved or cleared by the [place] FDA and has been  authorized for detection and/or diagnosis of SARS-CoV-2 by FDA under an Emergency Use Authorization (EUA). This EUA will remain in effect (meaning this test can be used) for the duration of the COVID-19 declaration under Section 564(b)(1) of the Act, 21 U.S.C. section 360bbb-3(b)(1), unless the authorization is terminated or revoked.  Performed at [facility], [place] [address], [place], [place] [address]      Radiological Exams on Admission: DG Chest Port 1 View  Result Date: 12/25/2020 CLINICAL DATA:  Shortness of breath chest pain for 1 week EXAM: PORTABLE CHEST 1 VIEW COMPARISON:  05/28/2020 FINDINGS: Cardiac shadow remains enlarged. The lungs are well aerated bilaterally. Patchy infiltrate is noted in the left mid lung. No sizable effusion is noted. No bony abnormality is seen. IMPRESSION: Mild patchy infiltrate in the left mid lung. Electronically Signed   By: Inez Catalina M.D.   On: 12/25/2020 03:23    EKG: Independently reviewed.  Atrial flutter 121 bpm with variable AV block  Assessment/Plan Paroxysmal atrial flutter/atrial fibrillation on chronic anticoagulation: Patient was noted to be in atrial flutter at 130 bpm.  She reports compliance with all of her medications including Xarelto, but sometimes takes it without a full meal.  She had been given diltiazem and started on diltiazem drip in the ED with some improvement in rate control. -Admit to a progressive bed -Check TSH -Goal to keep at least magnesium 2 and potassium 4 -Continue Cardizem drip -Continue amiodarone and Xarelto -Cardiology consulted, will follow-up for any further recommendations  Hypertensive urgency/emergency: Blood pressures initially elevated up to 201/137.  Home blood pressure medications include clonidine 0.1 mg twice daily, lisinopril 10 mg daily, furosemide 40 mg twice daily, and diltiazem CD 180 mg daily. -Continue clonidine and lisinopril -  Hydralazine IV as needed for elevated blood pressure  greater than 180  Heart failure with preserved EF: Possibly acute on chronic.  Patient presents with complaints of progressively worsening shortness of breath especially with exertion of complaints orthopnea at night.  On physical exam patient with significant lower extremity swelling, but was noted to have some JVD on physical exam.  Chest x-ray noted cardiac enlargement. BNP was elevated at 464.  Last echocardiogram in 20 revealed EF of 60 to 65% mild to moderate aortic valve regurgitation, mild mitral valve regurgitation, and left atrial enlargement.  Patient had been given Lasix 40 mg IV x1 dose with reported improvement in shortness of breath.   -Heart failure orders set  initiated  -Continuous pulse oximetry with nasal cannula oxygen as needed to keep O2 saturations >92% -Strict I&Os and daily weights -Elevate lower extremities -Lasix 40 mg IV Bid  -Reassess in a.m. and adjust diuresis as needed. -Check echocardiogram -Did not start beta-blocker given recent cocaine use and concern for acute decompensation -Continue ACE inhibitor -Follow-up with cardiology for any additional recommendations  SIRS: Patient was noted to be tachycardic and tachypneic on admission meeting SIRS criteria.  Suspect secondary to heart failure and cocaine use.  Although there also has the possibility of underlying infection versus PE. -Will check blood cultures and consider starting empiric antibiotics if patient develops fever  Dyspnea COPD with chronic bronchitis: Patient able to currently maintain O2 saturations on room air.  Significant wheezing appreciated on physical exam at this time at this time it is not totally clear if symptoms secondary to heart failure exacerbation vs pneumonia vs bronchitis/COPD vs issue with neck vs PE vs PAH. -Follow-up work-up as noted below -Mucinex -Albuterol inhaler as needed  Chest pain(acute) elevated troponin (chronic): Patient was initially complaining of chest pain.   High-sensitivity troponin was initially noted to be 20, but review of records shows it has been chronically elevated. EKG without significant acute ischemic changes..  Suspect this could be secondary to demand in the setting of recent use of cocaine.  Question possibility of blood clot given -Follow-up echocardiogram -Nitroglycerin as needed  Suspected aspiration pneumonia: Acute.  Patient reports that she has had a productive cough, but also admits to intermittently getting choked up once or food.  Chest x-ray noted a left lobe infiltrate.  Influenza and COVID-19 screening were negative.  D-dimers previously had been elevated in the past.  Question possibility of aspirin pneumonia. 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  Patient reports having difficulty swallowing.  Records note prior history of pharyngitis with concern for possible mass for which patient was referred to ENT but never followed up.  She also has a history of stroke which may or may not be related. -Check CT scan of the soft tissues of the neck -Speech therapy consulted to evaluate -May warrant further work-up  Cocaine and tobacco abuse: UDS positive for cocaine.  Patient admits that she still currently smokes half pack per day on average and last used cocaine 2 days ago -Continue counseling on need of cessation of tobacco and cocaine use - nicotine patch offered  Chronic kidney disease stage IIIa: Creatinine 1.19 which appears around patient's baseline. -Continue to monitor  Normocytic anemia: Hemoglobin 11.9 g/dL which appears near patient's baseline. -Continue to monitor  History of CVA  Hyperlipidemia -Continue atorvastatin  DVT prophylaxis: Xarelto Code Status:  Full Family Communication: Daughter updated at bedside Disposition Plan: Likely  discharge home once medically stable Consults called: Cardiology Admission status: Inpatient, require more than 2 midnight stay  Norval Morton MD [facility]   If 7PM-7AM, please contact night-coverage   12/25/2020, 7:18 AM

## 2020-12-25 NOTE — ED Triage Notes (Signed)
Pt states that for the past week she has been having CP and SOB, swelling in bilateral legs.

## 2020-12-25 NOTE — Progress Notes (Signed)
*  PRELIMINARY RESULTS* Echocardiogram 2D Echocardiogram has been performed.  Samuel Germany 12/25/2020, 4:30 PM

## 2020-12-25 NOTE — Consult Note (Signed)
Cardiology Consultation:   Patient ID: Zofia Peckinpaugh MRN: [id number]; DOB: 09/09/1965  Admit date: 12/25/2020 Date of Consult: 12/25/2020  PCP:  [name], MD   [facility] Providers Cardiologist:  [name], MD   Patient Profile:   Modean Mccullum is a 55 y.o. female with a PMH of non-obstructive CAD, paroxysmal atrial fibrillation/flutter s/p ablation in 2018 at [facility] with subsequent DCCV in 2683, chronic diastolic CHF, moderate-severe MR, mild pulm HTN, hypertension, hyperlipidemia, COPD, prior CVA, OSA not on CPAP, CKD stage 3a, depression, anxiety and polysubstance abuse, who is being seen 12/25/2020 for the evaluation of atrial flutter at the request of Dr. Bridgett Larsson.  History of Present Illness:   Ms. Palomarez reported a couple weeks of progressive shortness of breath.  She has noticed increased shortness of breath with activity to the point that she noticed shortness of breath ambulating a few feet to the bathroom.  She also had complaints of left-sided chest pain for the past few days.  She saw her PCP for these complaints 1 week ago and apparently had a proBNP level checked which was elevated.  PCP has been trying to contact the patient to advise ED visit however there were phone issues.  Patient ultimately presented to the ED 12/26/2018 for further evaluation.  She was last evaluated by cardiology at an outpatient visit with Dr. Oval Linsey 12/2018 at which time she was doing fairly well from a cardiac standpoint following her recent admission to the hospital 1 month prior for epiglottitis and neutropenic fever.  Hospital course was complicated by some atrial fibrillation though this resolved and rates were well controlled on diltiazem.  She was in sinus rhythm at her outpatient visit and continued on diltiazem, amiodarone, and Xarelto.  She was recommended to undergo a repeat echocardiogram to evaluate her moderate-severe MR.  Repeat echocardiogram 01/2019 showed EF 60-65%, moderate LAE,  mild MR, mild TR, and mild-moderate AI.  Her last ischemic evaluation was NST in 2016 which was without ischemia.  She had a R/LHC in 2012 which showed mild nonobstructive CAD.  Hospital course: hypertensive, intermittently tachycardic, intermittently tachypneic, afebrile, satting well on RA. Labs notable for electrolytes wnl, Cr 1.19 (baseline), Hgb 11.9, PLT 166, HsTrop 20 x2, BNP 464, procal negative, TSH wnl. COVID-19/influenza negative. Utox positive for cocaine. EKG showed atrial flutter with rate 121bpm, variable AV block, no STE/D. CXR mild patchy infiltrates in LML. CT Chest PE without PE but c/f multifocal PNA. She was given IV lasix 40mg  in the ED for possible CHF component and started on a diltiazem gtt for rate control of her atrial fibrillation. Cardiology asked to evaluate for atrial flutter.  At the time of this evaluation she reports improvement in her SOB but not back to baseline. She reports intermittent chest discomfort over the past couple months which always correlates with abdominal bloating and always resolves with improvement in fluid status. She has had orthopnea and PND recently. She notes intermittent LE edema. She has been coughing with intermittent sputum production (clear>yellow) but no fevers. She does not monitor her weights. She reports missing occasional doses of her antihypertensives and does not monitor her blood pressure at home. She reports intermittent palpitations when over-exerting herself but is currently without palpitations at the time of my exam. She continues to smoke 1ppd and uses cocaine daily. She tells me her daughter recently moved to [place] and plans to help get her on track.   Past Medical History:  Diagnosis Date   Anemia of other  chronic disease 11/13/2013   Anginal pain ([name])    none in past year   Anxiety    Asthma    patient denies   Candidiasis 06/23/2013   Chronic diastolic CHF (congestive heart failure) ([name])    Constipation     Coronary artery disease    Lexiscan myoview (10/12) with significant ST depression upon Lexiscan injection but no ischemia or infarction on perfusion images. Left heart cath (10/12): 30% mid LAD, 30% ostial D1, 50% ostial D2.     Cough 11/13/2013   Depression    Headache 02/22/2014   Hx of cardiovascular stress test    Lexiscan Myoview (2/16): Breast attenuation artifact, no ischemia, EF 64%; low risk   Hyperlipemia    Hypertension    Iron deficiency    hx of   Leukocytoclastic vasculitis ([name])    Rash across lower body, occurred in 9/11, diagnosed by skin biopsy. ANA positive. Thought to be secondary to cocaine use.   Leukopenia 03/21/2013   Lymphadenopathy 03/21/2013   Lymphadenopathy 01/30/2014   Mental disorder    Pancytopenia ([place])    Pancytopenia, acquired ([place]) 07/16/2015   Polysubstance abuse ([place])    Prior cocaine   Pulmonary HTN ([place])    Echo (9/11) with EF 65%, mild LVH, mild AI, mild MR, moderate to possibly severe TR with PA systolic pressure 52 mmHg. Echo (10/12): severe LV hypertrophy, EF 60-65%, mild MR, mild AI, moderate to severe tricuspid regurgitation, PA systolic pressure 38 mmHg.  Echo 4/14: moderate LVH, EF 65%, normal wall motion, diastolic dysfunction, mild AI, mild MR   Shortness of breath    a. PFTs 5/14:  FEV1/FVC 99% predicted; FVC 60% predicted; DLCO mildly reduced, mild restriction, air trapping.;  b.  seen by pulmo (Dr. [name]) 5/14: mainly upper airway symptoms - ACE d/c'd (sx's better)   Stroke [facility]) 2010ish   Syphilis 04/25/2013   Tobacco abuse    Tricuspid regurgitation    Unspecified deficiency anemia 03/29/2013    Past Surgical History:  Procedure Laterality Date   ANKLE SURGERY     right   BONE MARROW ASPIRATION     CARDIAC CATHETERIZATION     CARDIAC CATHETERIZATION N/A 04/23/2015   Procedure: Right Heart Cath;  Surgeon: [name], MD;  Location: [place] [facility];  Service: Cardiovascular;  Laterality: N/A;   CARDIOVERSION N/A 03/11/2018    Procedure: CARDIOVERSION;  Surgeon: [name], MD;  Location: [facility] ENDOSCOPY;  Service: Cardiovascular;  Laterality: N/A;   I & D EXTREMITY  08/09/2011   Procedure: IRRIGATION AND DEBRIDEMENT EXTREMITY;  Surgeon: [name], MD;  Location: [place];  Service: General;  Laterality: Left;  i & D Left axilla abscess   LYMPH NODE BIOPSY N/A 04/05/2013   Procedure: EXCISIONAL BIOPSY RIGHT SUBMANDIBULAR NODE, NASAL ENDOSCOPIC WITH BIOPSY NASAL PHARYNX;  Surgeon: [name], MD;  Location: [place];  Service: ENT;  Laterality: N/A;   MULTIPLE EXTRACTIONS WITH ALVEOLOPLASTY N/A 03/03/2019   Procedure: MULTIPLE EXTRACTION WITH ALVEOLOPLASTY;  Surgeon: [name], DDS;  Location: [place];  Service: Oral Surgery;  Laterality: N/A;   TEE WITHOUT CARDIOVERSION N/A 03/11/2018   Procedure: TRANSESOPHAGEAL ECHOCARDIOGRAM (TEE);  Surgeon: [name], MD;  Location: [facility] ENDOSCOPY;  Service: Cardiovascular;  Laterality: N/A;     Home Medications:  Prior to Admission medications   Medication Sig Start Date End Date Taking? Authorizing Provider  amiodarone (PACERONE) 200 MG tablet Take 1 tablet (200 mg total) by mouth daily. 06/29/19  Yes Erlene Quan, PA-C  ARIPiprazole (ABILIFY) 5 MG tablet Take 5 mg by mouth daily.   Yes [provider]  atorvastatin (LIPITOR) 40 MG tablet Take 1 tablet (40 mg total) by mouth at bedtime. 09/18/20  Yes [name], MD  buPROPion (WELLBUTRIN SR) 150 MG 12 hr tablet Take 1 tablet (150 mg total) by mouth 2 (two) times daily. 12/28/18  Yes [name], MD  citalopram (CELEXA) 40 MG tablet Take 1 tablet (40 mg total) by mouth daily. 12/28/18  Yes [name], MD  cloNIDine (CATAPRES) 0.1 MG tablet Take 1 tablet (0.1 mg total) by mouth 2 (two) times daily. 09/18/20  Yes [name], MD  diltiazem (CARDIZEM CD) 180 MG 24 hr capsule Take 1 capsule (180 mg total) by mouth daily. 09/18/20  Yes [name], MD  ferrous sulfate 325 (65 FE) MG EC tablet Take 1  tablet (325 mg total) by mouth 3 (three) times daily with meals. 12/28/18  Yes Newlin, Charlane Ferretti, MD  furosemide (LASIX) 40 MG tablet TAKE 1 TABLET (40 MG TOTAL) BY MOUTH 2 (TWO) TIMES DAILY. 04/19/19  Yes [name], MD  hydrOXYzine (ATARAX/VISTARIL) 50 MG tablet Take 50 mg by mouth 3 (three) times daily.   Yes [provider]  lisinopril (ZESTRIL) 10 MG tablet Take 1 tablet (10 mg total) by mouth daily. 09/18/20 12/25/20 Yes [name], MD  potassium chloride SA (KLOR-CON) 20 MEQ tablet Take 1 tablet (20 mEq total) by mouth daily. 07/10/20 01/16/21 Yes McClung, Dionne Bucy, PA-C  rivaroxaban (XARELTO) 20 MG TABS tablet Take 1 tablet (20 mg total) by mouth daily with supper. 09/18/20  Yes [name], MD  topiramate (TOPAMAX) 25 MG tablet Take 2 tablets (50 mg total) by mouth at bedtime. 12/28/18  Yes [name], MD  traZODone (DESYREL) 150 MG tablet Take 1 tablet (150 mg total) by mouth at bedtime. 12/28/18  Yes [name], MD    Inpatient Medications: Scheduled Meds:  amiodarone  200 mg Oral Daily   atorvastatin  40 mg Oral QHS   cloNIDine  0.1 mg Oral BID   furosemide  40 mg Intravenous BID   guaiFENesin  600 mg Oral BID   lisinopril  10 mg Oral Daily   nicotine  14 mg Transdermal Daily   [START ON 12/26/2020] potassium chloride SA  20 mEq Oral Daily   rivaroxaban  20 mg Oral Q supper   sodium chloride flush  3 mL Intravenous Q12H   topiramate  50 mg Oral QHS   Continuous Infusions:  sodium chloride     diltiazem (CARDIZEM) infusion 10 mg/hr (12/25/20 0745)   PRN Meds: sodium chloride, acetaminophen, hydrALAZINE, nitroGLYCERIN, ondansetron (ZOFRAN) IV, sodium chloride flush  Allergies:    Allergies  Allergen Reactions   Ciprofloxacin Itching    Social History:   Social History   Socioeconomic History   Marital status: Single    Spouse name: Not on file   Number of children: 2   Years of education: Not on file   Highest education level: GED or  equivalent  Occupational History   Occupation: unemployed  Tobacco Use   Smoking status: Every Day    Packs/day: 0.50    Years: 39.00    Pack years: 19.50    Types: Cigarettes   Smokeless tobacco: Never  Vaping Use   Vaping Use: Never used  Substance and Sexual Activity   Alcohol use: Not Currently    Comment: used in the past   Drug use:  Yes    Frequency: 7.0 times per week    Types: "Crack" cocaine    Comment: last use Monday 12/23/2020   Sexual activity: Not Currently  Other Topics Concern   Not on file  Social History Narrative   Lives with mom, sisters and brothers.   Social Determinants of Health   Financial Resource Strain: Medium Risk   Difficulty of Paying Living Expenses: Somewhat hard  Food Insecurity: No Food Insecurity   Worried About Charity fundraiser in the Last Year: Never true   Arboriculturist in the Last Year: Never true  Transportation Needs: Public librarian (Medical): Yes   Lack of Transportation (Non-Medical): Yes  Physical Activity: Not on file  Stress: Not on file  Social Connections: Not on file  Intimate Partner Violence: Not on file    Family History:    Family History  Problem Relation Age of Onset   Coronary artery disease Mother    Diabetes Mother    Diabetes Father    Hypertension Father    Coronary artery disease Father    Heart attack Father    Breast cancer Maternal Grandmother    Diabetes Maternal Grandmother    Heart attack Maternal Grandmother    Stroke Maternal Grandmother    Asthma Grandson      ROS:  Please see the history of present illness.   All other ROS reviewed and negative.     Physical Exam/Data:   Vitals:   12/25/20 1400 12/25/20 1410 12/25/20 1531 12/25/20 1600  BP: (!) 169/119 (!) 158/117 (!) 151/114 (!) 141/115  Pulse: (!) 103 (!) 106  (!) 103  Resp: (!) 29 18 (!) 22 (!) 22  Temp:  98.8 F (37.1 C)    TempSrc:  Oral    SpO2: 98% 99% 100%     Intake/Output  Summary (Last 24 hours) at 12/25/2020 1637 Last data filed at 12/25/2020 1500 Gross per 24 hour  Intake 69.96 ml  Output 1200 ml  Net -1130.04 ml   Last 3 Weights 12/24/2020 09/18/2020 05/29/2020  Weight (lbs) 162 lb 166 lb 12.8 oz 159 lb 9.6 oz  Weight (kg) 73.483 kg 75.66 kg 72.394 kg  Some encounter information is confidential and restricted. Go to Review Flowsheets activity to see all data.     There is no height or weight on file to calculate BMI.  General:  Appears older than stated age with mild tachypnea HEENT: sclera anicteric Neck: no JVD Vascular: No carotid bruits; Distal pulses 2+ bilaterally Cardiac:  normal S1, S2; IRIR; + murmur, no rubs or gallops Lungs:  clear to auscultation bilaterally, no wheezing, rhonchi or rales  Abd: soft, nontender, no hepatomegaly  Ext: no edema Musculoskeletal:  No deformities, BUE and BLE strength normal and equal Skin: warm and dry  Neuro:  CNs 2-12 intact, no focal abnormalities noted Psych:  Normal affect   EKG:  The EKG was personally reviewed and demonstrates:  atrial flutter with rate 121bpm, variable AV block, no STE/D Telemetry:  Telemetry was personally reviewed and demonstrates:  atrial flutter with variable AV block, rates improved to the 90s.   Relevant CV Studies: Echocardiogram 01/2019: 1. Left ventricular ejection fraction, by visual estimation, is 60 to  65%. The left ventricle has normal function. There is no left ventricular  hypertrophy.   2. Global right ventricle has normal systolic function.The right  ventricular size is normal. No increase in right ventricular wall  thickness.   3. Left atrial size was moderately dilated.   4. Right atrial size was normal.   5. The mitral valve is normal in structure. Mild mitral valve  regurgitation. No evidence of mitral stenosis.   6. The tricuspid valve is normal in structure. Tricuspid valve  regurgitation is mild.   7. Aortic valve regurgitation is mild to moderate.   8.  The aortic valve is tricuspid. Aortic valve regurgitation is mild to  moderate. Mild aortic valve sclerosis without stenosis.   9. The pulmonic valve was grossly normal. Pulmonic valve regurgitation is  mild.  10. Mildly elevated pulmonary artery systolic pressure.  11. The inferior vena cava is normal in size with greater than 50%  respiratory variability, suggesting right atrial pressure of 3 mmHg.   Right/left heart catheterization 2012: IMPRESSION:  The patient has nonobstructive mild coronary artery disease and she has normal filling pressures.  Of note, the patient's initial blood pressure was about 180/100.  She was treated with IV hydralazine with a fall in her blood pressure.  Her pressures at right heart catheterization were therefore taken with a normalized systemic blood pressure.  Although her right and left heart filling pressures were normal on this catheterization, it is quite possible that her shortness of breath has in the past been in the setting of significant diastolic dysfunction with high blood pressure.  This could potentially also explain her high pulmonary artery pressures noted on echocardiogram.  At this point, it will be most imperative to maintain good blood pressure control.  The patient's blood pressure was actually good when I saw her in the clinic and her pressures have been good when she measures it at home. Laboratory Data:  High Sensitivity Troponin:   Recent Labs  Lab 12/25/20 0305 12/25/20 0630  TROPONINIHS 20* 20*     Chemistry Recent Labs  Lab 12/25/20 0305 12/25/20 0630  NA 138  --   K 3.8  --   CL 110  --   CO2 20*  --   GLUCOSE 108*  --   BUN 22*  --   CREATININE 1.19*  --   CALCIUM 8.8*  --   MG  --  2.1  GFRNONAA 53*  --   ANIONGAP 8  --     Recent Labs  Lab 12/25/20 1223  PROT 7.4  ALBUMIN 3.6  AST 28  ALT 32  ALKPHOS 87  BILITOT 1.1   Lipids No results for input(s): CHOL, TRIG, HDL, LABVLDL, LDLCALC, CHOLHDL in  the last 168 hours.  Hematology Recent Labs  Lab 12/25/20 0305  WBC 7.3  RBC 4.41  HGB 11.9*  HCT 37.0  MCV 83.9  MCH 27.0  MCHC 32.2  RDW 15.6*  PLT 166   Thyroid  Recent Labs  Lab 12/25/20 0630  TSH 2.038    BNP Recent Labs  Lab 12/25/20 0305  BNP 464.0*    DDimer No results for input(s): DDIMER in the last 168 hours.   Radiology/Studies:  CT Angio Chest Pulmonary Embolism (PE) W or WO Contrast  Result Date: 12/25/2020 CLINICAL DATA:  PE suspected. High probability. Chest pain and shortness of breath. Lower extremity swelling. EXAM: CT ANGIOGRAPHY CHEST WITH CONTRAST TECHNIQUE: Multidetector CT imaging of the chest was performed using the standard protocol during bolus administration of intravenous contrast. Multiplanar CT image reconstructions and MIPs were obtained to evaluate the vascular anatomy. CONTRAST:  97mL OMNIPAQUE IOHEXOL 350 MG/ML SOLN COMPARISON:  03/08/2018 FINDINGS: Cardiovascular: Cardiac enlargement. Passive  venous congestion with reflux of contrast into the IVC and hepatic veins noted. No evidence of acute pulmonary embolus. Aortic atherosclerosis.  Coronary artery calcifications. Mediastinum/Nodes: No enlarged mediastinal, hilar, or axillary lymph nodes. Thyroid gland, trachea, and esophagus demonstrate no significant findings. Lungs/Pleura: No pleural effusion. Bilateral multifocal patchy airspace densities are identified. Most severe within the lingula, image 76/6. There also scattered areas of subsegmental atelectasis. Mild paraseptal and centrilobular emphysema. Upper Abdomen: No acute abnormality. Musculoskeletal: No chest wall abnormality. No acute or significant osseous findings. Review of the MIP images confirms the above findings. IMPRESSION: 1. No evidence for acute pulmonary embolus. 2. Bilateral multifocal patchy airspace densities are identified compatible with multifocal pneumonia. Followup imaging is recommended in 3-4 weeks following trial of  antibiotic therapy to ensure resolution and exclude underlying malignancy. 3. Cardiac enlargement with passive venous congestion suggesting right heart failure. 4. Aortic Atherosclerosis (ICD10-I70.0) and Emphysema (ICD10-J43.9). Coronary artery calcifications Electronically Signed   By: Kerby Moors M.D.   On: 12/25/2020 13:57   DG Chest Port 1 View  Result Date: 12/25/2020 CLINICAL DATA:  Shortness of breath chest pain for 1 week EXAM: PORTABLE CHEST 1 VIEW COMPARISON:  05/28/2020 FINDINGS: Cardiac shadow remains enlarged. The lungs are well aerated bilaterally. Patchy infiltrate is noted in the left mid lung. No sizable effusion is noted. No bony abnormality is seen. IMPRESSION: Mild patchy infiltrate in the left mid lung. Electronically Signed   By: Inez Catalina M.D.   On: 12/25/2020 03:23     Assessment and Plan:   1. Acute on chronic diastolic CHF: patient presents with progressive DOE, orthopnea, PND, and intermittent LE edema and chest pain. CXR with possible LML PNA and CTA Chest without PE but suggests multifocal PNA. WBC wnl and she is afebrile. Possible this reflects volume overload given recent symptomology. BNP elevated to 464. She was given IV lasix 40mg  in the ED with some improvement in SOB but not at baseline. She is still tachypneic on exam but lungs are clear and no LE edema. She does have some mild abdominal distension which she reports is where she hides her fluid. Last echo in 2020 showed EF 60-65%, moderate LAE, mild MR, mild TR, and mild-moderate AI.  - Favor trial of lasix - Continue IV lasix 40mg  BID  - Continue to monitor strict I&Os and daily weights - Continue to monitor electrolytes closely and replete as needed to maintain K >4, Mg >2 - Will follow-up repeat echocardiogram  - Avoid Bblocker given daily cocaine use  2. Paroxysmal atrial flutter: known history s/p ablation in 2018 c/b recurrent Aflutter/fib s/p DCCV in 2019 with ongoing intermittent episodes. Now here  with progressive DOE over the past several weeks. EKG on admission showed atrial flutter with RVR with rate up to 120s. She was started on a diltiazem gtt with overall improvement in rate. Echo is pending. CTA Chest c/f multifocal PNA though procal is negative. Possible CHF vs PNA is driving her RVR - Continue diltiazem gtt for rate control - Continue amiodarone for rate/rhythm control - Would avoid Bblockers given ongoing cocaine abuse - Continue xarelto for stroke ppx - likely  has missed doses in the past month - Will tentatively place on the schedule for TEE/DCCV for 12/27/20 at 2pm with Dr. Harriet Masson (consent below)  3. Non-obstructive CAD: diagnosed on LHC in 2012 with most recent ischemic evaluation being a NST. Intermittent chest pain likely correlates with #1. EKG is non-ischemic. HsTrop is 20 x2 - trend not c/w  ACS. Not on aspirin due to need for anticoagulation. Bblockers avoided with cocaine abuse - Continue statin - Will update FLP and A1C for risk stratification - Will follow-up echocardiogram results to evaluate LV function and wall motion  4. Valvulopathy: history of mild MR, mild TR, and mild-moderate AI on last echo in 2020. She has a murmur on exam today. MR has been moderate-severe in the past.  - Will follow-up echocardiogram  5. HTN: BP markedly elevated. She reports she does miss doses of her medications occasionally. Home clonidine and lisinopril continued. Clonidine not ideal in patient with noncompliance due to rebound HTN - Will monitor response to current regimen - could consider transition from lisinopril to valsartan or olmesartan for improved BP control - Could consider addition of hydralazine  - Continue to avoid Bblockers  6. HLD: no recent lipids - Will check FLP in AM - Continue atorvastatin  7. Cocaine and tobacco abuse: she knows she needs to quit. She reports 30 year history of both. She has been smoking cigarettes 1ppd and using cocaine daily.  - Continue to  encourage cessation  8. CKD stage 3a: Cr stable at 1.19 (baseline) - Continue to monitor closely with diuresis   Risk Assessment/Risk Scores:    New York Heart Association (NYHA) Functional Class NYHA Class III  CHA2DS2-VASc Score = 4  This indicates a 4.8% annual risk of stroke. The patient's score is based upon: CHF History: 1 HTN History: 1 Diabetes History: 0 Stroke History: 0 Vascular Disease History: 1 Age Score: 0 Gender Score: 1      For questions or updates, please contact [place] Please consult [email or web address] for contact info under    Signed, Abigail Butts, PA-C  12/25/2020 4:37 PM

## 2020-12-26 ENCOUNTER — Inpatient Hospital Stay (HOSPITAL_COMMUNITY): Payer: Medicaid Other

## 2020-12-26 DIAGNOSIS — I4892 Unspecified atrial flutter: Secondary | ICD-10-CM | POA: Diagnosis not present

## 2020-12-26 DIAGNOSIS — I5033 Acute on chronic diastolic (congestive) heart failure: Secondary | ICD-10-CM | POA: Diagnosis not present

## 2020-12-26 DIAGNOSIS — Z7901 Long term (current) use of anticoagulants: Secondary | ICD-10-CM | POA: Diagnosis not present

## 2020-12-26 DIAGNOSIS — F1410 Cocaine abuse, uncomplicated: Secondary | ICD-10-CM | POA: Diagnosis not present

## 2020-12-26 LAB — BASIC METABOLIC PANEL
Anion gap: 10 (ref 5–15)
BUN: 16 mg/dL (ref 6–20)
CO2: 20 mmol/L — ABNORMAL LOW (ref 22–32)
Calcium: 9.1 mg/dL (ref 8.9–10.3)
Chloride: 105 mmol/L (ref 98–111)
Creatinine, Ser: 1.37 mg/dL — ABNORMAL HIGH (ref 0.44–1.00)
GFR, Estimated: 45 mL/min — ABNORMAL LOW (ref >60)
Glucose, Bld: 112 mg/dL — ABNORMAL HIGH (ref 70–99)
Potassium: 3.3 mmol/L — ABNORMAL LOW (ref 3.5–5.1)
Sodium: 135 mmol/L (ref 135–145)

## 2020-12-26 LAB — LIPID PANEL
Cholesterol: 152 mg/dL (ref 0–200)
HDL: 48 mg/dL (ref >40)
LDL Cholesterol: 94 mg/dL (ref 0–99)
Total CHOL/HDL Ratio: 3.2 RATIO
Triglycerides: 51 mg/dL (ref <150)
VLDL: 10 mg/dL (ref 0–40)

## 2020-12-26 LAB — HEMOGLOBIN A1C
Hgb A1c MFr Bld: 5.2 % (ref 4.8–5.6)
Mean Plasma Glucose: 102.54 mg/dL

## 2020-12-26 LAB — PROTIME-INR
INR: 2.8 — ABNORMAL HIGH (ref 0.8–1.2)
Prothrombin Time: 29.6 seconds — ABNORMAL HIGH (ref 11.4–15.2)

## 2020-12-26 MED ORDER — FUROSEMIDE 40 MG PO TABS: 40.0000 mg | ORAL_TABLET | Freq: Two times a day (BID) | ORAL | Status: DC

## 2020-12-26 MED ORDER — SODIUM CHLORIDE 0.9 % IV SOLN: INTRAVENOUS | Status: DC

## 2020-12-26 MED ORDER — DILTIAZEM HCL ER COATED BEADS 180 MG PO CP24
300.0000 mg | ORAL_CAPSULE | Freq: Every day | ORAL | Status: DC
  Administered 2020-12-26 – 2020-12-28 (×3): 300 mg via ORAL
  Filled 2020-12-26 (×3): qty 1

## 2020-12-26 MED ORDER — POTASSIUM CHLORIDE 20 MEQ PO PACK
40.0000 meq | PACK | Freq: Once | ORAL | Status: AC
  Administered 2020-12-26: 40 meq via ORAL
  Filled 2020-12-26: qty 2

## 2020-12-26 NOTE — H&P (View-Only) (Signed)
Progress Note  Patient Name: Morgan Bean Date of Encounter: 12/26/2020  [facility] Cardiologist: [name], MD   Subjective   Feeling well.  Her breathing is much better today.  Inpatient Medications    Scheduled Meds:  amiodarone  200 mg Oral Daily   atorvastatin  40 mg Oral QHS   cloNIDine  0.1 mg Oral BID   furosemide  40 mg Intravenous BID   guaiFENesin  600 mg Oral BID   lisinopril  10 mg Oral Daily   nicotine  14 mg Transdermal Daily   potassium chloride SA  20 mEq Oral Daily   rivaroxaban  20 mg Oral Q supper   sodium chloride flush  3 mL Intravenous Q12H   topiramate  50 mg Oral QHS   Continuous Infusions:  sodium chloride     ampicillin-sulbactam (UNASYN) IV 3 g (12/26/20 0805)   diltiazem (CARDIZEM) infusion 12.5 mg/hr (12/25/20 2135)   PRN Meds: sodium chloride, acetaminophen, hydrALAZINE, nitroGLYCERIN, ondansetron (ZOFRAN) IV, sodium chloride flush   Vital Signs    Vitals:   12/25/20 2200 12/26/20 0000 12/26/20 0400 12/26/20 0600  BP: (!) 136/106 (!) 142/83 (!) 133/105 (!) 145/98  Pulse:      Resp: 15 (!) 23 12 (!) 22  Temp: 99.5 F (37.5 C) 99 F (37.2 C) 98.8 F (37.1 C) 98 F (36.7 C)  TempSrc: Oral Oral Oral   SpO2: 100% 96% 97% 97%  Weight:    71.8 kg    Intake/Output Summary (Last 24 hours) at 12/26/2020 0937 Last data filed at 12/26/2020 0600 Gross per 24 hour  Intake 269.96 ml  Output 1200 ml  Net -930.04 ml   Last 3 Weights 12/26/2020 12/24/2020 09/18/2020  Weight (lbs) 158 lb 4.6 oz 162 lb 166 lb 12.8 oz  Weight (kg) 71.8 kg 73.483 kg 75.66 kg  Some encounter information is confidential and restricted. Go to Review Flowsheets activity to see all data.      Telemetry    Atrial flutter.  Rates mostly less than 100 bpm- Personally Reviewed  ECG     12/25/2020: Atrial flutter.  Rate 110 bpm.- Personally Reviewed  Physical Exam   GEN: No acute distress.   Neck: No JVD Cardiac: Irregularly irregular. no murmurs,  rubs, or gallops.  Respiratory: Clear to auscultation bilaterally. GI: Soft, nontender, non-distended  MS: No edema; No deformity. Neuro:  Nonfocal  Psych: Normal affect   Labs    High Sensitivity Troponin:   Recent Labs  Lab 12/25/20 0305 12/25/20 0630  TROPONINIHS 20* 20*     Chemistry Recent Labs  Lab 12/25/20 0305 12/25/20 0630 12/25/20 1223 12/26/20 0232  NA 138  --   --  135  K 3.8  --   --  3.3*  CL 110  --   --  105  CO2 20*  --   --  20*  GLUCOSE 108*  --   --  112*  BUN 22*  --   --  16  CREATININE 1.19*  --   --  1.37*  CALCIUM 8.8*  --   --  9.1  MG  --  2.1  --   --   PROT  --   --  7.4  --   ALBUMIN  --   --  3.6  --   AST  --   --  28  --   ALT  --   --  32  --   ALKPHOS  --   --  87  --   BILITOT  --   --  1.1  --   GFRNONAA 53*  --   --  45*  ANIONGAP 8  --   --  10    Lipids  Recent Labs  Lab 12/26/20 0232  CHOL 152  TRIG 51  HDL 48  LDLCALC 94  CHOLHDL 3.2    Hematology Recent Labs  Lab 12/25/20 0305  WBC 7.3  RBC 4.41  HGB 11.9*  HCT 37.0  MCV 83.9  MCH 27.0  MCHC 32.2  RDW 15.6*  PLT 166   Thyroid  Recent Labs  Lab 12/25/20 0630  TSH 2.038    BNP Recent Labs  Lab 12/25/20 0305  BNP 464.0*    DDimer No results for input(s): DDIMER in the last 168 hours.   Radiology    CT Angio Chest Pulmonary Embolism (PE) W or WO Contrast  Result Date: 12/25/2020 CLINICAL DATA:  PE suspected. High probability. Chest pain and shortness of breath. Lower extremity swelling. EXAM: CT ANGIOGRAPHY CHEST WITH CONTRAST TECHNIQUE: Multidetector CT imaging of the chest was performed using the standard protocol during bolus administration of intravenous contrast. Multiplanar CT image reconstructions and MIPs were obtained to evaluate the vascular anatomy. CONTRAST:  16mL OMNIPAQUE IOHEXOL 350 MG/ML SOLN COMPARISON:  03/08/2018 FINDINGS: Cardiovascular: Cardiac enlargement. Passive venous congestion with reflux of contrast into the IVC and  hepatic veins noted. No evidence of acute pulmonary embolus. Aortic atherosclerosis.  Coronary artery calcifications. Mediastinum/Nodes: No enlarged mediastinal, hilar, or axillary lymph nodes. Thyroid gland, trachea, and esophagus demonstrate no significant findings. Lungs/Pleura: No pleural effusion. Bilateral multifocal patchy airspace densities are identified. Most severe within the lingula, image 76/6. There also scattered areas of subsegmental atelectasis. Mild paraseptal and centrilobular emphysema. Upper Abdomen: No acute abnormality. Musculoskeletal: No chest wall abnormality. No acute or significant osseous findings. Review of the MIP images confirms the above findings. IMPRESSION: 1. No evidence for acute pulmonary embolus. 2. Bilateral multifocal patchy airspace densities are identified compatible with multifocal pneumonia. Followup imaging is recommended in 3-4 weeks following trial of antibiotic therapy to ensure resolution and exclude underlying malignancy. 3. Cardiac enlargement with passive venous congestion suggesting right heart failure. 4. Aortic Atherosclerosis (ICD10-I70.0) and Emphysema (ICD10-J43.9). Coronary artery calcifications Electronically Signed   By: Kerby Moors M.D.   On: 12/25/2020 13:57   DG Chest Port 1 View  Result Date: 12/25/2020 CLINICAL DATA:  Shortness of breath chest pain for 1 week EXAM: PORTABLE CHEST 1 VIEW COMPARISON:  05/28/2020 FINDINGS: Cardiac shadow remains enlarged. The lungs are well aerated bilaterally. Patchy infiltrate is noted in the left mid lung. No sizable effusion is noted. No bony abnormality is seen. IMPRESSION: Mild patchy infiltrate in the left mid lung. Electronically Signed   By: Inez Catalina M.D.   On: 12/25/2020 03:23   ECHOCARDIOGRAM COMPLETE  Result Date: 12/25/2020    ECHOCARDIOGRAM REPORT   Patient Name:   Morgan Bean Date of Exam: 12/25/2020 Medical Rec #:  [id number]    Height:       66.0 in Accession #:    [id number]   Weight:        162.0 lb Date of Birth:  [birth year]    BSA:          1.828 m Patient Age:    55 years     BP:           158/117 mmHg Patient Gender: F  HR:           91 bpm. Exam Location:  Inpatient Procedure: 2D Echo, Cardiac Doppler and Color Doppler Indications:    Atrial Fibrillation I48.91  History:        Patient has prior history of Echocardiogram examinations, most                 recent 01/25/2019. CHF, CAD, COPD and Stroke, Arrythmias:Atrial                 Flutter; Risk Factors:Hypertension and Current Smoker. Mitral                 regurgitation. Mild pulmonary hypertension. Atrial flutter s/p                 ablation. Obstructive sleep apnea (CPAP). Polysubstance abuse.  Sonographer:    Alvino Chapel RCS Referring Phys: [id number] RONDELL A SMITH IMPRESSIONS  1. Left ventricular ejection fraction, by estimation, is 60 to 65%. The left ventricle has normal function. The left ventricle has no regional wall motion abnormalities. There is mild left ventricular hypertrophy. Left ventricular diastolic function could not be evaluated.  2. Right ventricular systolic function is normal. The right ventricular size is normal. There is normal pulmonary artery systolic pressure. The estimated right ventricular systolic pressure is 97.3 mmHg.  3. Left atrial size was severely dilated.  4. Right atrial size was severely dilated.  5. The mitral valve is abnormal. Moderate mitral valve regurgitation. Moderate mitral annular calcification.  6. The tricuspid valve is abnormal. Tricuspid valve regurgitation is moderate.  7. The aortic valve is tricuspid. Aortic valve regurgitation is moderate. Mild aortic valve sclerosis is present, with no evidence of aortic valve stenosis. Aortic regurgitation PHT measures 498 msec.  8. The inferior vena cava is normal in size with <50% respiratory variability, suggesting right atrial pressure of 8 mmHg. Comparison(s): Changes from prior study are noted. 01/25/2019: LVEF 60-65%, mild MR, mild to  moderate AI, mild TR. FINDINGS  Left Ventricle: Left ventricular ejection fraction, by estimation, is 60 to 65%. The left ventricle has normal function. The left ventricle has no regional wall motion abnormalities. The left ventricular internal cavity size was normal in size. There is  mild left ventricular hypertrophy. Left ventricular diastolic function could not be evaluated due to atrial fibrillation. Left ventricular diastolic function could not be evaluated. Right Ventricle: The right ventricular size is normal. No increase in right ventricular wall thickness. Right ventricular systolic function is normal. There is normal pulmonary artery systolic pressure. The tricuspid regurgitant velocity is 2.51 m/s, and  with an assumed right atrial pressure of 8 mmHg, the estimated right ventricular systolic pressure is 53.2 mmHg. Left Atrium: Left atrial size was severely dilated. Right Atrium: Right atrial size was severely dilated. Pericardium: There is no evidence of pericardial effusion. Mitral Valve: The mitral valve is abnormal. There is moderate calcification of the anterior and posterior mitral valve leaflet(s). Moderate mitral annular calcification. Moderate mitral valve regurgitation. Tricuspid Valve: The tricuspid valve is abnormal. Tricuspid valve regurgitation is moderate. Aortic Valve: The aortic valve is tricuspid. Aortic valve regurgitation is moderate. Aortic regurgitation PHT measures 498 msec. Mild aortic valve sclerosis is present, with no evidence of aortic valve stenosis. Pulmonic Valve: The pulmonic valve was grossly normal. Pulmonic valve regurgitation is trivial. Aorta: The aortic root and ascending aorta are structurally normal, with no evidence of dilitation. Venous: The inferior vena cava is normal in size with less than 50% respiratory variability,  suggesting right atrial pressure of 8 mmHg. IAS/Shunts: No atrial level shunt detected by color flow Doppler.  LEFT VENTRICLE PLAX 2D LVIDd:          4.90 cm LVIDs:         3.20 cm LV PW:         1.30 cm LV IVS:        1.40 cm LVOT diam:     1.90 cm LV SV:         58 LV SV Index:   31 LVOT Area:     2.84 cm  LEFT ATRIUM              Index       RIGHT ATRIUM           Index LA diam:        4.10 cm  2.24 cm/m  RA Area:     31.10 cm LA Vol (A2C):   170.0 ml 92.97 ml/m RA Volume:   115.00 ml 62.89 ml/m LA Vol (A4C):   105.0 ml 57.42 ml/m LA Biplane Vol: 136.0 ml 74.38 ml/m  AORTIC VALVE LVOT Vmax:   126.00 cm/s LVOT Vmean:  86.700 cm/s LVOT VTI:    0.203 m AI PHT:      498 msec  AORTA Ao Root diam: 3.60 cm MITRAL VALVE                TRICUSPID VALVE MV Area (PHT): 3.48 cm     TR Peak grad:   25.2 mmHg MV Decel Time: 218 msec     TR Vmax:        251.00 cm/s MR Peak grad: 134.6 mmHg MR Mean grad: 87.0 mmHg     SHUNTS MR Vmax:      580.00 cm/s   Systemic VTI:  0.20 m MR Vmean:     432.0 cm/s    Systemic Diam: 1.90 cm MV E velocity: 111.00 cm/s Lyman Bishop MD Electronically signed by Lyman Bishop MD Signature Date/Time: 12/25/2020/5:11:22 PM    Final     Cardiac Studies   Echo 12/25/2020:  1. Left ventricular ejection fraction, by estimation, is 60 to 65%. The  left ventricle has normal function. The left ventricle has no regional  wall motion abnormalities. There is mild left ventricular hypertrophy.  Left ventricular diastolic function  could not be evaluated.   2. Right ventricular systolic function is normal. The right ventricular  size is normal. There is normal pulmonary artery systolic pressure. The  estimated right ventricular systolic pressure is 74.9 mmHg.   3. Left atrial size was severely dilated.   4. Right atrial size was severely dilated.   5. The mitral valve is abnormal. Moderate mitral valve regurgitation.  Moderate mitral annular calcification.   6. The tricuspid valve is abnormal. Tricuspid valve regurgitation is  moderate.   7. The aortic valve is tricuspid. Aortic valve regurgitation is moderate.  Mild aortic valve  sclerosis is present, with no evidence of aortic valve  stenosis. Aortic regurgitation PHT measures 498 msec.   8. The inferior vena cava is normal in size with <50% respiratory  variability, suggesting right atrial pressure of 8 mmHg.   Patient Profile     55 y.o. female with moderate to severe med regurgitation, hypertension, hyperlipidemia, chronic diastolic heart failure, nonobstructive CAD, atrial fibrillation/flutter status post ablation, and daily cocaine use admitted with atrial flutter with rapid ventricular response and community-acquired pneumonia.  Assessment & Plan    #  Atrial flutter: Status post ablation in 2018.  She presented with recurrent atrial flutter.  Rates are now well-controlled on a diltiazem drip.  She was on 180 mg oral.  Her IV equivalent has been 300 mg.  We will transition to oral and stop the IV in 1 hour.  Continue Eliquis.  She is going for TEE/cardioversion tomorrow.  Continue to advise avoiding cocaine use.   [facility] has been requested to perform a transesophageal echocardiogram and cardioversion on [facility] for atrial flutter.  After careful review of history and examination, the risks and benefits of transesophageal echocardiogram have been explained including risks of esophageal damage, perforation (1:10,000 risk), bleeding, pharyngeal hematoma as well as other potential complications associated with conscious sedation including aspiration, arrhythmia, respiratory failure and death. Alternatives to treatment were discussed, questions were answered. Patient is willing to proceed.   #Acute on chronic diastolic heart failure: #CAP/aspiration pneumonia: # hypoxia: She is admitted with heart failure symptoms and has been diuresed.  Chest x-ray was also concerning for community-acquired pneumonia.  She is had a cough that has been productive of phlegm but no fevers or chills until last night.  She was started on empiric antibiotics.  Appreciate  hospitalist team starting this.  Yesterday she was net -2.1 L and her renal function is starting to climb.  We will stop IV Lasix.  Resume Lasix 40 mg p.o. twice daily tomorrow.  #Essential hypertension: Her blood pressure regimen is somewhat odd.  She is only on 10 mg of lisinopril, clonidine, and diltiazem.  Blood pressures are not well have been controlled.  We are increasing diltiazem as above.  Ideally she would not be on clonidine.  She was previously on amlodipine.  Unclear why this was stopped. ?edema.  Continue her regimen for now.       For questions or updates, please contact [place] Please consult [email or web address] for contact info under        Signed, [name], MD  12/26/2020, 9:37 AM

## 2020-12-26 NOTE — Progress Notes (Signed)
Progress Note  Patient Name: Morgan Bean Date of Encounter: 12/26/2020  [facility] Cardiologist: [name], MD   Subjective   Feeling well.  Her breathing is much better today.  Inpatient Medications    Scheduled Meds:  amiodarone  200 mg Oral Daily   atorvastatin  40 mg Oral QHS   cloNIDine  0.1 mg Oral BID   furosemide  40 mg Intravenous BID   guaiFENesin  600 mg Oral BID   lisinopril  10 mg Oral Daily   nicotine  14 mg Transdermal Daily   potassium chloride SA  20 mEq Oral Daily   rivaroxaban  20 mg Oral Q supper   sodium chloride flush  3 mL Intravenous Q12H   topiramate  50 mg Oral QHS   Continuous Infusions:  sodium chloride     ampicillin-sulbactam (UNASYN) IV 3 g (12/26/20 0805)   diltiazem (CARDIZEM) infusion 12.5 mg/hr (12/25/20 2135)   PRN Meds: sodium chloride, acetaminophen, hydrALAZINE, nitroGLYCERIN, ondansetron (ZOFRAN) IV, sodium chloride flush   Vital Signs    Vitals:   12/25/20 2200 12/26/20 0000 12/26/20 0400 12/26/20 0600  BP: (!) 136/106 (!) 142/83 (!) 133/105 (!) 145/98  Pulse:      Resp: 15 (!) 23 12 (!) 22  Temp: 99.5 F (37.5 C) 99 F (37.2 C) 98.8 F (37.1 C) 98 F (36.7 C)  TempSrc: Oral Oral Oral   SpO2: 100% 96% 97% 97%  Weight:    71.8 kg    Intake/Output Summary (Last 24 hours) at 12/26/2020 0937 Last data filed at 12/26/2020 0600 Gross per 24 hour  Intake 269.96 ml  Output 1200 ml  Net -930.04 ml   Last 3 Weights 12/26/2020 12/24/2020 09/18/2020  Weight (lbs) 158 lb 4.6 oz 162 lb 166 lb 12.8 oz  Weight (kg) 71.8 kg 73.483 kg 75.66 kg  Some encounter information is confidential and restricted. Go to Review Flowsheets activity to see all data.      Telemetry    Atrial flutter.  Rates mostly less than 100 bpm- Personally Reviewed  ECG     12/25/2020: Atrial flutter.  Rate 110 bpm.- Personally Reviewed  Physical Exam   GEN: No acute distress.   Neck: No JVD Cardiac: Irregularly irregular. no murmurs,  rubs, or gallops.  Respiratory: Clear to auscultation bilaterally. GI: Soft, nontender, non-distended  MS: No edema; No deformity. Neuro:  Nonfocal  Psych: Normal affect   Labs    High Sensitivity Troponin:   Recent Labs  Lab 12/25/20 0305 12/25/20 0630  TROPONINIHS 20* 20*     Chemistry Recent Labs  Lab 12/25/20 0305 12/25/20 0630 12/25/20 1223 12/26/20 0232  NA 138  --   --  135  K 3.8  --   --  3.3*  CL 110  --   --  105  CO2 20*  --   --  20*  GLUCOSE 108*  --   --  112*  BUN 22*  --   --  16  CREATININE 1.19*  --   --  1.37*  CALCIUM 8.8*  --   --  9.1  MG  --  2.1  --   --   PROT  --   --  7.4  --   ALBUMIN  --   --  3.6  --   AST  --   --  28  --   ALT  --   --  32  --   ALKPHOS  --   --  87  --   BILITOT  --   --  1.1  --   GFRNONAA 53*  --   --  45*  ANIONGAP 8  --   --  10    Lipids  Recent Labs  Lab 12/26/20 0232  CHOL 152  TRIG 51  HDL 48  LDLCALC 94  CHOLHDL 3.2    Hematology Recent Labs  Lab 12/25/20 0305  WBC 7.3  RBC 4.41  HGB 11.9*  HCT 37.0  MCV 83.9  MCH 27.0  MCHC 32.2  RDW 15.6*  PLT 166   Thyroid  Recent Labs  Lab 12/25/20 0630  TSH 2.038    BNP Recent Labs  Lab 12/25/20 0305  BNP 464.0*    DDimer No results for input(s): DDIMER in the last 168 hours.   Radiology    CT Angio Chest Pulmonary Embolism (PE) W or WO Contrast  Result Date: 12/25/2020 CLINICAL DATA:  PE suspected. High probability. Chest pain and shortness of breath. Lower extremity swelling. EXAM: CT ANGIOGRAPHY CHEST WITH CONTRAST TECHNIQUE: Multidetector CT imaging of the chest was performed using the standard protocol during bolus administration of intravenous contrast. Multiplanar CT image reconstructions and MIPs were obtained to evaluate the vascular anatomy. CONTRAST:  61mL OMNIPAQUE IOHEXOL 350 MG/ML SOLN COMPARISON:  03/08/2018 FINDINGS: Cardiovascular: Cardiac enlargement. Passive venous congestion with reflux of contrast into the IVC and  hepatic veins noted. No evidence of acute pulmonary embolus. Aortic atherosclerosis.  Coronary artery calcifications. Mediastinum/Nodes: No enlarged mediastinal, hilar, or axillary lymph nodes. Thyroid gland, trachea, and esophagus demonstrate no significant findings. Lungs/Pleura: No pleural effusion. Bilateral multifocal patchy airspace densities are identified. Most severe within the lingula, image 76/6. There also scattered areas of subsegmental atelectasis. Mild paraseptal and centrilobular emphysema. Upper Abdomen: No acute abnormality. Musculoskeletal: No chest wall abnormality. No acute or significant osseous findings. Review of the MIP images confirms the above findings. IMPRESSION: 1. No evidence for acute pulmonary embolus. 2. Bilateral multifocal patchy airspace densities are identified compatible with multifocal pneumonia. Followup imaging is recommended in 3-4 weeks following trial of antibiotic therapy to ensure resolution and exclude underlying malignancy. 3. Cardiac enlargement with passive venous congestion suggesting right heart failure. 4. Aortic Atherosclerosis (ICD10-I70.0) and Emphysema (ICD10-J43.9). Coronary artery calcifications Electronically Signed   By: Kerby Moors M.D.   On: 12/25/2020 13:57   DG Chest Port 1 View  Result Date: 12/25/2020 CLINICAL DATA:  Shortness of breath chest pain for 1 week EXAM: PORTABLE CHEST 1 VIEW COMPARISON:  05/28/2020 FINDINGS: Cardiac shadow remains enlarged. The lungs are well aerated bilaterally. Patchy infiltrate is noted in the left mid lung. No sizable effusion is noted. No bony abnormality is seen. IMPRESSION: Mild patchy infiltrate in the left mid lung. Electronically Signed   By: Inez Catalina M.D.   On: 12/25/2020 03:23   ECHOCARDIOGRAM COMPLETE  Result Date: 12/25/2020    ECHOCARDIOGRAM REPORT   Patient Name:   [facility] Date of Exam: 12/25/2020 Medical Rec #:  [id number]    Height:       66.0 in Accession #:    [id number]   Weight:        162.0 lb Date of Birth:  Nov 01, 1965    BSA:          1.828 m Patient Age:    55 years     BP:           158/117 mmHg Patient Gender: F  HR:           91 bpm. Exam Location:  Inpatient Procedure: 2D Echo, Cardiac Doppler and Color Doppler Indications:    Atrial Fibrillation I48.91  History:        Patient has prior history of Echocardiogram examinations, most                 recent 01/25/2019. CHF, CAD, COPD and Stroke, Arrythmias:Atrial                 Flutter; Risk Factors:Hypertension and Current Smoker. Mitral                 regurgitation. Mild pulmonary hypertension. Atrial flutter s/p                 ablation. Obstructive sleep apnea (CPAP). Polysubstance abuse.  Sonographer:    Alvino Chapel RCS Referring Phys: [id number] RONDELL A SMITH IMPRESSIONS  1. Left ventricular ejection fraction, by estimation, is 60 to 65%. The left ventricle has normal function. The left ventricle has no regional wall motion abnormalities. There is mild left ventricular hypertrophy. Left ventricular diastolic function could not be evaluated.  2. Right ventricular systolic function is normal. The right ventricular size is normal. There is normal pulmonary artery systolic pressure. The estimated right ventricular systolic pressure is 11.9 mmHg.  3. Left atrial size was severely dilated.  4. Right atrial size was severely dilated.  5. The mitral valve is abnormal. Moderate mitral valve regurgitation. Moderate mitral annular calcification.  6. The tricuspid valve is abnormal. Tricuspid valve regurgitation is moderate.  7. The aortic valve is tricuspid. Aortic valve regurgitation is moderate. Mild aortic valve sclerosis is present, with no evidence of aortic valve stenosis. Aortic regurgitation PHT measures 498 msec.  8. The inferior vena cava is normal in size with <50% respiratory variability, suggesting right atrial pressure of 8 mmHg. Comparison(s): Changes from prior study are noted. 01/25/2019: LVEF 60-65%, mild MR, mild to  moderate AI, mild TR. FINDINGS  Left Ventricle: Left ventricular ejection fraction, by estimation, is 60 to 65%. The left ventricle has normal function. The left ventricle has no regional wall motion abnormalities. The left ventricular internal cavity size was normal in size. There is  mild left ventricular hypertrophy. Left ventricular diastolic function could not be evaluated due to atrial fibrillation. Left ventricular diastolic function could not be evaluated. Right Ventricle: The right ventricular size is normal. No increase in right ventricular wall thickness. Right ventricular systolic function is normal. There is normal pulmonary artery systolic pressure. The tricuspid regurgitant velocity is 2.51 m/s, and  with an assumed right atrial pressure of 8 mmHg, the estimated right ventricular systolic pressure is 41.7 mmHg. Left Atrium: Left atrial size was severely dilated. Right Atrium: Right atrial size was severely dilated. Pericardium: There is no evidence of pericardial effusion. Mitral Valve: The mitral valve is abnormal. There is moderate calcification of the anterior and posterior mitral valve leaflet(s). Moderate mitral annular calcification. Moderate mitral valve regurgitation. Tricuspid Valve: The tricuspid valve is abnormal. Tricuspid valve regurgitation is moderate. Aortic Valve: The aortic valve is tricuspid. Aortic valve regurgitation is moderate. Aortic regurgitation PHT measures 498 msec. Mild aortic valve sclerosis is present, with no evidence of aortic valve stenosis. Pulmonic Valve: The pulmonic valve was grossly normal. Pulmonic valve regurgitation is trivial. Aorta: The aortic root and ascending aorta are structurally normal, with no evidence of dilitation. Venous: The inferior vena cava is normal in size with less than 50% respiratory variability,  suggesting right atrial pressure of 8 mmHg. IAS/Shunts: No atrial level shunt detected by color flow Doppler.  LEFT VENTRICLE PLAX 2D LVIDd:          4.90 cm LVIDs:         3.20 cm LV PW:         1.30 cm LV IVS:        1.40 cm LVOT diam:     1.90 cm LV SV:         58 LV SV Index:   31 LVOT Area:     2.84 cm  LEFT ATRIUM              Index       RIGHT ATRIUM           Index LA diam:        4.10 cm  2.24 cm/m  RA Area:     31.10 cm LA Vol (A2C):   170.0 ml 92.97 ml/m RA Volume:   115.00 ml 62.89 ml/m LA Vol (A4C):   105.0 ml 57.42 ml/m LA Biplane Vol: 136.0 ml 74.38 ml/m  AORTIC VALVE LVOT Vmax:   126.00 cm/s LVOT Vmean:  86.700 cm/s LVOT VTI:    0.203 m AI PHT:      498 msec  AORTA Ao Root diam: 3.60 cm MITRAL VALVE                TRICUSPID VALVE MV Area (PHT): 3.48 cm     TR Peak grad:   25.2 mmHg MV Decel Time: 218 msec     TR Vmax:        251.00 cm/s MR Peak grad: 134.6 mmHg MR Mean grad: 87.0 mmHg     SHUNTS MR Vmax:      580.00 cm/s   Systemic VTI:  0.20 m MR Vmean:     432.0 cm/s    Systemic Diam: 1.90 cm MV E velocity: 111.00 cm/s Lyman Bishop MD Electronically signed by Lyman Bishop MD Signature Date/Time: 12/25/2020/5:11:22 PM    Final     Cardiac Studies   Echo 12/25/2020:  1. Left ventricular ejection fraction, by estimation, is 60 to 65%. The  left ventricle has normal function. The left ventricle has no regional  wall motion abnormalities. There is mild left ventricular hypertrophy.  Left ventricular diastolic function  could not be evaluated.   2. Right ventricular systolic function is normal. The right ventricular  size is normal. There is normal pulmonary artery systolic pressure. The  estimated right ventricular systolic pressure is 79.3 mmHg.   3. Left atrial size was severely dilated.   4. Right atrial size was severely dilated.   5. The mitral valve is abnormal. Moderate mitral valve regurgitation.  Moderate mitral annular calcification.   6. The tricuspid valve is abnormal. Tricuspid valve regurgitation is  moderate.   7. The aortic valve is tricuspid. Aortic valve regurgitation is moderate.  Mild aortic valve  sclerosis is present, with no evidence of aortic valve  stenosis. Aortic regurgitation PHT measures 498 msec.   8. The inferior vena cava is normal in size with <50% respiratory  variability, suggesting right atrial pressure of 8 mmHg.   Patient Profile     55 y.o. female with moderate to severe med regurgitation, hypertension, hyperlipidemia, chronic diastolic heart failure, nonobstructive CAD, atrial fibrillation/flutter status post ablation, and daily cocaine use admitted with atrial flutter with rapid ventricular response and community-acquired pneumonia.  Assessment & Plan    #  Atrial flutter: Status post ablation in 2018.  She presented with recurrent atrial flutter.  Rates are now well-controlled on a diltiazem drip.  She was on 180 mg oral.  Her IV equivalent has been 300 mg.  We will transition to oral and stop the IV in 1 hour.  Continue Eliquis.  She is going for TEE/cardioversion tomorrow.  Continue to advise avoiding cocaine use.   [facility] has been requested to perform a transesophageal echocardiogram and cardioversion on [facility] for atrial flutter.  After careful review of history and examination, the risks and benefits of transesophageal echocardiogram have been explained including risks of esophageal damage, perforation (1:10,000 risk), bleeding, pharyngeal hematoma as well as other potential complications associated with conscious sedation including aspiration, arrhythmia, respiratory failure and death. Alternatives to treatment were discussed, questions were answered. Patient is willing to proceed.   #Acute on chronic diastolic heart failure: #CAP/aspiration pneumonia: # hypoxia: She is admitted with heart failure symptoms and has been diuresed.  Chest x-ray was also concerning for community-acquired pneumonia.  She is had a cough that has been productive of phlegm but no fevers or chills until last night.  She was started on empiric antibiotics.  Appreciate  hospitalist team starting this.  Yesterday she was net -2.1 L and her renal function is starting to climb.  We will stop IV Lasix.  Resume Lasix 40 mg p.o. twice daily tomorrow.  #Essential hypertension: Her blood pressure regimen is somewhat odd.  She is only on 10 mg of lisinopril, clonidine, and diltiazem.  Blood pressures are not well have been controlled.  We are increasing diltiazem as above.  Ideally she would not be on clonidine.  She was previously on amlodipine.  Unclear why this was stopped. ?edema.  Continue her regimen for now.       For questions or updates, please contact [place] Please consult [email or web address] for contact info under        Signed, [name], MD  12/26/2020, 9:37 AM

## 2020-12-26 NOTE — Progress Notes (Signed)
Speech Language Pathology Treatment: Dysphagia  Patient Details Name: Morgan Bean MRN: [id number] DOB: 1965/03/27 Today's Date: 12/26/2020 Time: 0347-4259 SLP Time Calculation (min) (ACUTE ONLY): 18 min  Assessment / Plan / Recommendation Clinical Impression  Pt was seen for dysphagia treatment. Dyspnea was improved compared to the initial evaluation, but some exacerbation was noted during conversation. Pt was educated regarding the results of the modified barium swallow study, diet recommendations, swallowing precautions, and plan of care. Video recording of the study was used to facilitate education. Pt verbalized understanding and she demonstrated understanding when relaying information to her daughter via phone. Pt reported that she was seated upright in chair for lunch and that she tolerated it without overt s/sx of aspiration. Pt demonstrated coughing with thin liquids once when she was slightly reclined and took a larger sip immediately after the initial sip, but no other s/sx of aspiration were noted and pt immediately stated, "I went too fast". SLP will continue to follow pt.    HPI HPI: Pt is a 55 y.o. female who presented with complaints of shortness of breath that had been progressively worsening two weeks prior to admission. CXR 10/5: Mild patchy infiltrate in the left mid lung. Pt reported to referring MD having intermittent difficulty swallowing and getting choked/coughing when eating. PMH: hypertension, hyperlipidemia, paroxysmal atrial fibrillation/atrial flutter on Xarelto, HFpEF, MR, CVA, CAD, COPD, cocaine abuse, and tobacco abuse. CT scans from 2020 did note concern for pharyngitis, tonsillitis, and supraglottitis with associated masslike thickening and edema of the hypopharynx at the level of the piriformis which could reflect acute inflammation versus underlying mass. BSE 12/18/18: mild oral dysphagia with prolonged oral phase with regular texture solids; pt discharged from SLP  services on 9/28 on dysphagia 3/thin. Outpatient laryngoscopy 56/3/87 wih Dr. Erik Obey: "Nasopharynx is clear. Oropharynx is clear. Hypopharynx/larynx shows thickening of the epiglottis, thickening of the right arytenoid and somewhat narrow supraglottis. Vocal cords are mobile and without lesions. Her larynx is abnormal although the pattern is difficult to identify. I wonder if this could be related to levamisole toxicity from her cocaine use."      SLP Plan  Continue with current plan of care      Recommendations for follow up therapy are one component of a multi-disciplinary discharge planning process, led by the attending physician.  Recommendations may be updated based on patient status, additional functional criteria and insurance authorization.    Recommendations  Diet recommendations: Dysphagia 3 (mechanical soft);Thin liquid Liquids provided via: Cup;No straw Medication Administration: Whole meds with puree Supervision: Patient able to self feed;Intermittent supervision to cue for compensatory strategies Compensations: Slow rate;Small sips/bites                Oral Care Recommendations: Oral care BID Follow up Recommendations: None SLP Visit Diagnosis: Dysphagia, oropharyngeal phase (R13.12) Plan: Continue with current plan of care       Shalene Gallen I. Hardin Negus, [place], [place] Office number [phone] Pager [place]  12/26/2020, 3:45 PM

## 2020-12-26 NOTE — Progress Notes (Addendum)
Modified Barium Swallow Progress Note  Patient Details  Name: Morgan Bean MRN: [id number] Date of Birth: [birth year]  Today's Date: 12/26/2020  Modified Barium Swallow completed.  Full report located under Chart Review in the Imaging Section.  Brief recommendations include the following:  Clinical Impression  Pt presents with oropharyngeal dysphagia characterized by reduced tongue base retraction, impaired bolus cohesion, reduced laryngeal elevation, reduced anterior laryngeal movement, and a pharyngeal delay. The swallow was often triggered with the head of the bolus at the level of the pyriform sinuses. Penetration (PAS 3,5) was noted with thin liquids via cup and straw. Prompted coughing was effective in expelling the penetrate and laryngeal invasion was eliminated with cued reduction in bolus size. Pharyngeal clearance was [facility] and no esophageal stasis noted. However, pt demonstrated an effortful swallow with nectar thick liquids and with solids. Pt reported that she needed to do this "to get it down". Arytenoid(s) appear(s) enlarged and R arytenoid thickening was noted on laryngoscopy on 12/23/18. Anterior pharyngeal wall thickening/convex posterior protrusion (See image below) was noted at the level of C3-C5 which aligns with the "masslike thickening and edema of the hypopharynx at the level of the pyriform sinus" noted on the CT soft tissue on 12/20/18. Considering pt's history, and her reports of worsening dysphagia, SLP is in agreement with the repeat CT soft tissue ordered by Dr. Tamala Julian on 12/25/20, and recommends consideration of otolaryngology consult. Pt's current diet of dysphagia 3 solids and thin liquids will be continued with observance of swallowing precautions, and SLP will continue to follow pt.    Swallow Evaluation Recommendations       SLP Diet Recommendations: Dysphagia 3 (Mech soft) solids;Thin liquid   Liquid Administration via: Cup;No straw   Medication Administration:  Whole meds with puree   Supervision: Patient able to self feed   Compensations: Slow rate;Small sips/bites   Postural Changes: Seated upright at 90 degrees          Cynde Menard I. Hardin Negus, [place], [place] Office number [phone] Pager [place] 12/26/2020,9:58 AM

## 2020-12-26 NOTE — Progress Notes (Addendum)
PROGRESS NOTE    Morgan Bean  EHO:[id number] DOB: October 19, 1965 DOA: 12/25/2020 PCP: [name], MD    Brief Narrative:  This 55 years old female with PMH significant for hypertension, hyperlipidemia, paroxysmal A. Fib / Atrial flutter on Xarelto, HFpEF, MR, CVA, CAD, COPD, cocaine abuse and tobacco abuse presents in the ED with complaints of shortness of breath which has progressively gotten worse for last couple of weeks. She still reports smoking cigarettes and using cocaine,  last cocaine use 2 days ago.  Patient also reports having difficulty swallowing and hoarse voice,  stated she was referred to ENT but has not made any appointment. ED course :  urine drug screen positive for cocaine, chest x-ray shows mild patchy infiltrate of left lung.  EKG shows A. fib with RVR.  Patient is started on Cardizem drip.  Cardiology consulted. Patient is admitted for A. fib with RVR and suspected aspiration pneumonia.  Assessment & Plan:   Principal Problem:   Atrial flutter with rapid ventricular response ([name]) Active Problems:   Chronic diastolic heart failure ([name])   History of cocaine abuse ([name])   Chest pain, mid sternal   History of CVA (cerebrovascular accident)   Cigarette smoker   Pharyngeal mass   History of stroke   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 05/23/18    Hypertensive urgency   Chronic anticoagulation   Elevated troponin   Dysphagia   Acute on chronic congestive heart failure ([name])   Rheumatic mitral regurgitation   Cocaine abuse ([name])   Atrial flutter/A. Fib with RVR: Patient s/p ablation in 2018, presented with recurrent atrial flutter. Patient presented with progressive shortness of breath and chest pain in the setting of cocaine use. EKG shows A. fib with RVR, HR 130 Patient reports compliant with all her medication including Xarelto. Continue Cardizem gtt for rate control. Continue amiodarone and [facility] consulted.  Recommended  TEE with cardioversion once euvolemic.  Hypertensive urgency: > Improved. Continue clonidine, lisinopril, furosemide. Increase Cardizem CD to 300 mg p.o. daily Continue IV hydralazine as needed for SBP above 180  Heart failure with preserved EF: Patient presented with progressive SOB, orthopnea, pedal edema, chest x-ray cardiomegaly, BNP 464. Last echo 2020: LVEF 60 to 65%. Patient was given 60 mg IV Lasix, with significant improvement in breathing. Monitor intake and output charting and daily weight Continue Lasix 40 mg IV twice daily. Repeat echocardiogram Avoid beta-blocker given recent cocaine use  SIRS:  Patient was noted to be tachycardic and tachypneic on admission meeting SIRS criteria. Suspect could be secondary to heart failure and cocaine use Follow blood cultures and consider empiric antibiotics if she develops fever.  Elevated troponins: Patient initially complained of chest pain with elevated troponin. Review of her records show troponins has been chronically elevated. EKG without significant acute ischemic changes. Follow-up echocardiogram and cardio recommendation.  Suspected aspiration pneumonia: Patient reports productive cough, difficulty swallowing. Chest x-ray noted left lower lobe infiltrate.   CTA ruled out PE but shows multifocal pneumonia Continue aspiration precautions, procalcitonin 0.10 Continue Unasyn, follow-up cultures.  Dysphagia/hoarse voice: Patient reports history of pharyngeal mass. Speech and swallow evaluation recommended ENT consult. Dysphagia 3 diet. Obtain CT scan of the soft tissues of the neck.  Cocaine and tobacco abuse: Urine drug screen positive for cocaine. Extensive counseling about cessation from substances completed.   CKD stage IIIa: Serum creatinine slightly up today. Avoid nephrotoxic medication, continue to monitor  Hyperlipidemia: Continue statins  Normocytic anemia: Hemoglobin remained stable.  DVT  prophylaxis: Xarelto Code Status: Full code Family Communication: No family at bedside  disposition Plan:   Status is: Inpatient  Remains inpatient appropriate because:Inpatient level of care appropriate due to severity of illness  Dispo: The patient is from: Home              Anticipated d/c is to: Home              Patient currently is not medically stable to d/c.   Difficult to place patient No  Consultants:  Cardiology  Procedures echocardiogram Antimicrobials:  Anti-infectives (From admission, onward)    Start     Dose/Rate Route Frequency Ordered Stop   12/25/20 2130  Ampicillin-Sulbactam (UNASYN) 3 g in sodium chloride 0.9 % 100 mL IVPB        3 g 200 mL/hr over 30 Minutes Intravenous Every 6 hours 12/25/20 2044         Subjective: Patient was seen and examined at bedside.  Overnight events noted.   Patient reports feeling much better, still reports mild cough but denies any chest pain and shortness of breath. Patient spiked fever last night, started on Unasyn.  Objective: Vitals:   12/25/20 2200 12/26/20 0000 12/26/20 0400 12/26/20 0600  BP: (!) 136/106 (!) 142/83 (!) 133/105 (!) 145/98  Pulse:      Resp: 15 (!) 23 12 (!) 22  Temp: 99.5 F (37.5 C) 99 F (37.2 C) 98.8 F (37.1 C) 98 F (36.7 C)  TempSrc: Oral Oral Oral   SpO2: 100% 96% 97% 97%  Weight:    71.8 kg    Intake/Output Summary (Last 24 hours) at 12/26/2020 1206 Last data filed at 12/26/2020 0600 Gross per 24 hour  Intake 269.96 ml  Output 1200 ml  Net -930.04 ml   Filed Weights   12/26/20 0600  Weight: 71.8 kg    Examination:  General exam: Appears comfortable, not in any acute distress.  Chronically ill looking. Respiratory system: Clear to auscultation bilaterally, respiratory effort normal, RR 15. Cardiovascular system: S1-S2 heard, irregular rhythm, murmur+ Gastrointestinal system: Abdomen is soft, nontender, nondistended, BS + Central nervous system: Alert and oriented x 3 . No  focal neurological deficits. Extremities: No edema, no cyanosis, no clubbing. Skin: No rashes, lesions or ulcers Psychiatry: Judgement and insight appear normal. Mood & affect appropriate.     Data Reviewed: I have personally reviewed following labs and imaging studies  CBC: Recent Labs  Lab 12/25/20 0305  WBC 7.3  NEUTROABS 3.6  HGB 11.9*  HCT 37.0  MCV 83.9  PLT 384   Basic Metabolic Panel: Recent Labs  Lab 12/25/20 0305 12/25/20 0630 12/26/20 0232  NA 138  --  135  K 3.8  --  3.3*  CL 110  --  105  CO2 20*  --  20*  GLUCOSE 108*  --  112*  BUN 22*  --  16  CREATININE 1.19*  --  1.37*  CALCIUM 8.8*  --  9.1  MG  --  2.1  --    GFR: Estimated Creatinine Clearance: 46 mL/min (A) (by C-G formula based on SCr of 1.37 mg/dL (H)). Liver Function Tests: Recent Labs  Lab 12/25/20 1223  AST 28  ALT 32  ALKPHOS 87  BILITOT 1.1  PROT 7.4  ALBUMIN 3.6   No results for input(s): LIPASE, AMYLASE in the last 168 hours. No results for input(s): AMMONIA in the last 168 hours. Coagulation Profile: No results for input(s): INR, PROTIME in  the last 168 hours. Cardiac Enzymes: No results for input(s): CKTOTAL, CKMB, CKMBINDEX, TROPONINI in the last 168 hours. BNP (last 3 results) Recent Labs    12/17/20 1044  PROBNP 2,714*   HbA1C: Recent Labs    12/26/20 0232  HGBA1C 5.2   CBG: No results for input(s): GLUCAP in the last 168 hours. Lipid Profile: Recent Labs    12/26/20 0232  CHOL 152  HDL 48  LDLCALC 94  TRIG 51  CHOLHDL 3.2   Thyroid Function Tests: Recent Labs    12/25/20 0630  TSH 2.038   Anemia Panel: No results for input(s): VITAMINB12, FOLATE, FERRITIN, TIBC, IRON, RETICCTPCT in the last 72 hours. Sepsis Labs: Recent Labs  Lab 12/25/20 1223  PROCALCITON <0.10    Recent Results (from the past 240 hour(s))  Resp Panel by RT-PCR (Flu A&B, Covid) Nasopharyngeal Swab     Status: None   Collection Time: 12/25/20  3:05 AM   Specimen:  Nasopharyngeal Swab; Nasopharyngeal(NP) swabs in vial transport medium  Result Value Ref Range Status   SARS Coronavirus 2 by RT PCR NEGATIVE NEGATIVE Final    Comment: (NOTE) SARS-CoV-2 target nucleic acids are NOT DETECTED.  The SARS-CoV-2 RNA is generally detectable in upper respiratory specimens during the acute phase of infection. The lowest concentration of SARS-CoV-2 viral copies this assay can detect is 138 copies/mL. A negative result does not preclude SARS-Cov-2 infection and should not be used as the sole basis for treatment or other patient management decisions. A negative result may occur with  improper specimen collection/handling, submission of specimen other than nasopharyngeal swab, presence of viral mutation(s) within the areas targeted by this assay, and inadequate number of viral copies(<138 copies/mL). A negative result must be combined with clinical observations, patient history, and epidemiological information. The expected result is Negative.  Fact Sheet for Patients:  EntrepreneurPulse.com.au  Fact Sheet for Healthcare Providers:  IncredibleEmployment.be  This test is no t yet approved or cleared by the [place] FDA and  has been authorized for detection and/or diagnosis of SARS-CoV-2 by FDA under an Emergency Use Authorization (EUA). This EUA will remain  in effect (meaning this test can be used) for the duration of the COVID-19 declaration under Section 564(b)(1) of the Act, 21 U.S.C.section 360bbb-3(b)(1), unless the authorization is terminated  or revoked sooner.       Influenza A by PCR NEGATIVE NEGATIVE Final   Influenza B by PCR NEGATIVE NEGATIVE Final    Comment: (NOTE) The Xpert Xpress SARS-CoV-2/FLU/RSV plus assay is intended as an aid in the diagnosis of influenza from Nasopharyngeal swab specimens and should not be used as a sole basis for treatment. Nasal washings and aspirates are unacceptable for  Xpert Xpress SARS-CoV-2/FLU/RSV testing.  Fact Sheet for Patients: EntrepreneurPulse.com.au  Fact Sheet for Healthcare Providers: IncredibleEmployment.be  This test is not yet approved or cleared by the [place] FDA and has been authorized for detection and/or diagnosis of SARS-CoV-2 by FDA under an Emergency Use Authorization (EUA). This EUA will remain in effect (meaning this test can be used) for the duration of the COVID-19 declaration under Section 564(b)(1) of the Act, 21 U.S.C. section 360bbb-3(b)(1), unless the authorization is terminated or revoked.  Performed at [facility], [place] [address], [place], [place] [address]     Radiology Studies: CT Angio Chest Pulmonary Embolism (PE) W or WO Contrast  Result Date: 12/25/2020 CLINICAL DATA:  PE suspected. High probability. Chest pain and shortness of breath. Lower extremity swelling.  EXAM: CT ANGIOGRAPHY CHEST WITH CONTRAST TECHNIQUE: Multidetector CT imaging of the chest was performed using the standard protocol during bolus administration of intravenous contrast. Multiplanar CT image reconstructions and MIPs were obtained to evaluate the vascular anatomy. CONTRAST:  41mL OMNIPAQUE IOHEXOL 350 MG/ML SOLN COMPARISON:  03/08/2018 FINDINGS: Cardiovascular: Cardiac enlargement. Passive venous congestion with reflux of contrast into the IVC and hepatic veins noted. No evidence of acute pulmonary embolus. Aortic atherosclerosis.  Coronary artery calcifications. Mediastinum/Nodes: No enlarged mediastinal, hilar, or axillary lymph nodes. Thyroid gland, trachea, and esophagus demonstrate no significant findings. Lungs/Pleura: No pleural effusion. Bilateral multifocal patchy airspace densities are identified. Most severe within the lingula, image 76/6. There also scattered areas of subsegmental atelectasis. Mild paraseptal and centrilobular emphysema. Upper Abdomen: No acute abnormality.  Musculoskeletal: No chest wall abnormality. No acute or significant osseous findings. Review of the MIP images confirms the above findings. IMPRESSION: 1. No evidence for acute pulmonary embolus. 2. Bilateral multifocal patchy airspace densities are identified compatible with multifocal pneumonia. Followup imaging is recommended in 3-4 weeks following trial of antibiotic therapy to ensure resolution and exclude underlying malignancy. 3. Cardiac enlargement with passive venous congestion suggesting right heart failure. 4. Aortic Atherosclerosis (ICD10-I70.0) and Emphysema (ICD10-J43.9). Coronary artery calcifications Electronically Signed   By: Kerby Moors M.D.   On: 12/25/2020 13:57   DG Chest Port 1 View  Result Date: 12/25/2020 CLINICAL DATA:  Shortness of breath chest pain for 1 week EXAM: PORTABLE CHEST 1 VIEW COMPARISON:  05/28/2020 FINDINGS: Cardiac shadow remains enlarged. The lungs are well aerated bilaterally. Patchy infiltrate is noted in the left mid lung. No sizable effusion is noted. No bony abnormality is seen. IMPRESSION: Mild patchy infiltrate in the left mid lung. Electronically Signed   By: Inez Catalina M.D.   On: 12/25/2020 03:23   DG Swallowing Func-Speech Pathology  Result Date: 12/26/2020 Table formatting from the original result was not included. Images from the original result were not included. Objective Swallowing Evaluation: Type of Study: Bedside Swallow Evaluation  Patient Details Name: Briseidy Spark MRN: [id number] Date of Birth: 1965/09/14 Today's Date: 12/26/2020 Time: SLP Start Time (ACUTE ONLY): 0830 -SLP Stop Time (ACUTE ONLY): 0847 SLP Time Calculation (min) (ACUTE ONLY): 17 min Past Medical History: Past Medical History: Diagnosis Date  Anemia of other chronic disease 11/13/2013  Anginal pain ([place])   none in past year  Anxiety   Asthma   patient denies  Candidiasis 05/22/5174  Chronic diastolic CHF (congestive heart failure) ([name])   Constipation   Coronary artery disease    Lexiscan myoview (10/12) with significant ST depression upon Lexiscan injection but no ischemia or infarction on perfusion images. Left heart cath (10/12): 30% mid LAD, 30% ostial D1, 50% ostial D2.    Cough 11/13/2013  Depression   Headache 02/22/2014  Hx of cardiovascular stress test   Lexiscan Myoview (2/16): Breast attenuation artifact, no ischemia, EF 64%; low risk  Hyperlipemia   Hypertension   Iron deficiency   hx of  Leukocytoclastic vasculitis ([name])   Rash across lower body, occurred in 9/11, diagnosed by skin biopsy. ANA positive. Thought to be secondary to cocaine use.  Leukopenia 03/21/2013  Lymphadenopathy 03/21/2013  Lymphadenopathy 01/30/2014  Mental disorder   Pancytopenia ([place])   Pancytopenia, acquired ([place]) 07/16/2015  Polysubstance abuse ([place])   Prior cocaine  Pulmonary HTN ([place])   Echo (9/11) with EF 65%, mild LVH, mild AI, mild MR, moderate to possibly severe TR with PA systolic pressure 52 mmHg. Echo (10/12): severe  LV hypertrophy, EF 60-65%, mild MR, mild AI, moderate to severe tricuspid regurgitation, PA systolic pressure 38 mmHg.  Echo 4/14: moderate LVH, EF 65%, normal wall motion, diastolic dysfunction, mild AI, mild MR  Shortness of breath   a. PFTs 5/14:  FEV1/FVC 99% predicted; FVC 60% predicted; DLCO mildly reduced, mild restriction, air trapping.;  b.  seen by pulmo (Dr. [name]) 5/14: mainly upper airway symptoms - ACE d/c'd (sx's better)  Stroke [facility]) 2010ish  Syphilis 04/25/2013  Tobacco abuse   Tricuspid regurgitation   Unspecified deficiency anemia 03/29/2013 Past Surgical History: Past Surgical History: Procedure Laterality Date  ANKLE SURGERY    right  BONE MARROW ASPIRATION    CARDIAC CATHETERIZATION    CARDIAC CATHETERIZATION N/A 04/23/2015  Procedure: Right Heart Cath;  Surgeon: [name], MD;  Location: [facility];  Service: Cardiovascular;  Laterality: N/A;  CARDIOVERSION N/A 03/11/2018  Procedure: CARDIOVERSION;  Surgeon: [name], MD;  Location: [facility] ENDOSCOPY;   Service: Cardiovascular;  Laterality: N/A;  I & D EXTREMITY  08/09/2011  Procedure: IRRIGATION AND DEBRIDEMENT EXTREMITY;  Surgeon: [name], MD;  Location: [name];  Service: General;  Laterality: Left;  i & D Left axilla abscess  LYMPH NODE BIOPSY N/A 04/05/2013  Procedure: EXCISIONAL BIOPSY RIGHT SUBMANDIBULAR NODE, NASAL ENDOSCOPIC WITH BIOPSY NASAL PHARYNX;  Surgeon: [name], MD;  Location: [facility];  Service: ENT;  Laterality: N/A;  MULTIPLE EXTRACTIONS WITH ALVEOLOPLASTY N/A 03/03/2019  Procedure: MULTIPLE EXTRACTION WITH ALVEOLOPLASTY;  Surgeon: [name], DDS;  Location: [facility];  Service: Oral Surgery;  Laterality: N/A;  TEE WITHOUT CARDIOVERSION N/A 03/11/2018  Procedure: TRANSESOPHAGEAL ECHOCARDIOGRAM (TEE);  Surgeon: [name], MD;  Location: [facility] ENDOSCOPY;  Service: Cardiovascular;  Laterality: N/A; HPI: Pt is a 55 y.o. female who presented with complaints of shortness of breath that had been progressively worsening two weeks prior to admission. CXR 10/5: Mild patchy infiltrate in the left mid lung. Pt reported to referring MD having intermittent difficulty swallowing and getting choked/coughing when eating. PMH: hypertension, hyperlipidemia, paroxysmal atrial fibrillation/atrial flutter on Xarelto, HFpEF, MR, CVA, CAD, COPD, cocaine abuse, and tobacco abuse. CT scans from 2020 did note concern for pharyngitis, tonsillitis, and supraglottitis with associated masslike thickening and edema of the hypopharynx at the level of the piriformis which could reflect acute inflammation versus underlying mass. BSE 12/18/18: mild oral dysphagia with prolonged oral phase with regular texture solids; pt discharged from SLP services on 9/28 on dysphagia 3/thin. Outpatient laryngoscopy 09/22/69 wih Dr. Erik Obey: "Nasopharynx is clear. Oropharynx is clear. Hypopharynx/larynx shows thickening of the epiglottis, thickening of the right arytenoid and somewhat narrow supraglottis. Vocal cords are mobile and without  lesions. Her larynx is abnormal although the pattern is difficult to identify. I wonder if this could be related to levamisole toxicity from her cocaine use."  Assessment / Plan / Recommendation CHL IP CLINICAL IMPRESSIONS 12/26/2020 Clinical Impression Pt presents with oropharyngeal dysphagia characterized by reduced tongue base retraction, impaired bolus cohesion, reduced laryngeal elevation, reduced anterior laryngeal movement, and a pharyngeal delay. The swallow was often triggered with the head of the bolus at the level of the pyriform sinuses. Penetration (PAS 3,5) was noted with thin liquids via cup and straw. Prompted coughing was effective in expelling the penetrate and laryngeal invasion was eliminated with cued reduction in bolus size. Pharyngeal clearance was [facility] and no esophageal stasis noted. However, pt demonstrated an effortful swallow with nectar thick liquids and with solids. Pt reported that she  needed to do this "to get it down". Arytenoid(s) appear(s) enlarged and R arytenoid thickening was noted on laryngoscopy on 12/23/18. Anterior pharyngeal wall thickening/convex posterior protrusion (See image below) was noted at the level of C3-C5 which aligns with the "masslike thickening and edema of the hypopharynx at the level of the pyriform sinus" noted on the CT soft tissue on 12/20/18. Considering pt's history, and her reports of worsening dysphagia, SLP is in agreement with the repeat CT soft tissue ordered by Dr. Tamala Julian on 12/25/20, and recommends consideration of otolaryngology consult. Pt's current diet of dysphagia 3 solids and thin liquids will be continued with observance of swallowing precautions, and SLP will continue to follow pt. SLP Visit Diagnosis Dysphagia, oropharyngeal phase (R13.12) Attention and concentration deficit following -- Frontal lobe and executive function deficit following -- Impact on safety and function Mild aspiration risk    CHL IP TREATMENT RECOMMENDATION 12/26/2020  Treatment Recommendations Therapy as outlined in treatment plan below   Prognosis 12/26/2020 Prognosis for Safe Diet Advancement Fair Barriers to Reach Goals Time post onset Barriers/Prognosis Comment -- CHL IP DIET RECOMMENDATION 12/26/2020 SLP Diet Recommendations Dysphagia 3 (Mech soft) solids;Thin liquid Liquid Administration via Cup;No straw Medication Administration Whole meds with puree Compensations Slow rate;Small sips/bites Postural Changes Seated upright at 90 degrees   No flowsheet data found.  CHL IP FOLLOW UP RECOMMENDATIONS 12/26/2020 Follow up Recommendations None   CHL IP FREQUENCY AND DURATION 12/26/2020 Speech Therapy Frequency (ACUTE ONLY) min 2x/week Treatment Duration 2 weeks      CHL IP ORAL PHASE 12/26/2020 Oral Phase Impaired Oral - Pudding Teaspoon -- Oral - Pudding Cup -- Oral - Honey Teaspoon -- Oral - Honey Cup -- Oral - Nectar Teaspoon -- Oral - Nectar Cup -- Oral - Nectar Straw Decreased bolus cohesion;Premature spillage Oral - Thin Teaspoon -- Oral - Thin Cup Decreased bolus cohesion;Premature spillage Oral - Thin Straw Decreased bolus cohesion;Premature spillage Oral - Puree WFL Oral - Mech Soft WFL Oral - Regular -- Oral - Multi-Consistency -- Oral - Pill Decreased bolus cohesion;Premature spillage Oral Phase - Comment --  CHL IP PHARYNGEAL PHASE 12/26/2020 Pharyngeal Phase Impaired Pharyngeal- Pudding Teaspoon -- Pharyngeal -- Pharyngeal- Pudding Cup -- Pharyngeal -- Pharyngeal- Honey Teaspoon -- Pharyngeal -- Pharyngeal- Honey Cup -- Pharyngeal -- Pharyngeal- Nectar Teaspoon -- Pharyngeal -- Pharyngeal- Nectar Cup -- Pharyngeal -- Pharyngeal- Nectar Straw Delayed swallow initiation-vallecula Pharyngeal -- Pharyngeal- Thin Teaspoon -- Pharyngeal -- Pharyngeal- Thin Cup Delayed swallow initiation-pyriform sinuses;Penetration/Aspiration during swallow Pharyngeal Material enters airway, remains ABOVE vocal cords and not ejected out Pharyngeal- Thin Straw Delayed swallow initiation-pyriform  sinuses;Penetration/Aspiration during swallow Pharyngeal Material enters airway, CONTACTS cords and not ejected out Pharyngeal- Puree WFL Pharyngeal -- Pharyngeal- Mechanical Soft WFL Pharyngeal -- Pharyngeal- Regular -- Pharyngeal -- Pharyngeal- Multi-consistency -- Pharyngeal -- Pharyngeal- Pill Delayed swallow initiation-pyriform sinuses;Penetration/Aspiration during swallow Pharyngeal Material enters airway, remains ABOVE vocal cords and not ejected out Pharyngeal Comment --  Shanika I. Hardin Negus, [place], [place] Office number [phone] Pager [place] 12/26/2020, 10:22 AM              ECHOCARDIOGRAM COMPLETE  Result Date: 12/25/2020    ECHOCARDIOGRAM REPORT   Patient Name:   [facility] Date of Exam: 12/25/2020 Medical Rec #:  [id number]    Height:       66.0 in Accession #:    [id number]   Weight:       162.0 lb Date of Birth:  [birth year]  BSA:          1.828 m Patient Age:    33 years     BP:           158/117 mmHg Patient Gender: F            HR:           91 bpm. Exam Location:  Inpatient Procedure: 2D Echo, Cardiac Doppler and Color Doppler Indications:    Atrial Fibrillation I48.91  History:        Patient has prior history of Echocardiogram examinations, most                 recent 01/25/2019. CHF, CAD, COPD and Stroke, Arrythmias:Atrial                 Flutter; Risk Factors:Hypertension and Current Smoker. Mitral                 regurgitation. Mild pulmonary hypertension. Atrial flutter s/p                 ablation. Obstructive sleep apnea (CPAP). Polysubstance abuse.  Sonographer:    Alvino Chapel RCS Referring Phys: [id number] RONDELL A SMITH IMPRESSIONS  1. Left ventricular ejection fraction, by estimation, is 60 to 65%. The left ventricle has normal function. The left ventricle has no regional wall motion abnormalities. There is mild left ventricular hypertrophy. Left ventricular diastolic function could not be evaluated.  2. Right ventricular  systolic function is normal. The right ventricular size is normal. There is normal pulmonary artery systolic pressure. The estimated right ventricular systolic pressure is 79.0 mmHg.  3. Left atrial size was severely dilated.  4. Right atrial size was severely dilated.  5. The mitral valve is abnormal. Moderate mitral valve regurgitation. Moderate mitral annular calcification.  6. The tricuspid valve is abnormal. Tricuspid valve regurgitation is moderate.  7. The aortic valve is tricuspid. Aortic valve regurgitation is moderate. Mild aortic valve sclerosis is present, with no evidence of aortic valve stenosis. Aortic regurgitation PHT measures 498 msec.  8. The inferior vena cava is normal in size with <50% respiratory variability, suggesting right atrial pressure of 8 mmHg. Comparison(s): Changes from prior study are noted. 01/25/2019: LVEF 60-65%, mild MR, mild to moderate AI, mild TR. FINDINGS  Left Ventricle: Left ventricular ejection fraction, by estimation, is 60 to 65%. The left ventricle has normal function. The left ventricle has no regional wall motion abnormalities. The left ventricular internal cavity size was normal in size. There is  mild left ventricular hypertrophy. Left ventricular diastolic function could not be evaluated due to atrial fibrillation. Left ventricular diastolic function could not be evaluated. Right Ventricle: The right ventricular size is normal. No increase in right ventricular wall thickness. Right ventricular systolic function is normal. There is normal pulmonary artery systolic pressure. The tricuspid regurgitant velocity is 2.51 m/s, and  with an assumed right atrial pressure of 8 mmHg, the estimated right ventricular systolic pressure is 38.3 mmHg. Left Atrium: Left atrial size was severely dilated. Right Atrium: Right atrial size was severely dilated. Pericardium: There is no evidence of pericardial effusion. Mitral Valve: The mitral valve is abnormal. There is moderate  calcification of the anterior and posterior mitral valve leaflet(s). Moderate mitral annular calcification. Moderate mitral valve regurgitation. Tricuspid Valve: The tricuspid valve is abnormal. Tricuspid valve regurgitation is moderate. Aortic Valve: The aortic valve is tricuspid. Aortic valve regurgitation is moderate. Aortic regurgitation PHT measures 498 msec. Mild aortic valve sclerosis is  present, with no evidence of aortic valve stenosis. Pulmonic Valve: The pulmonic valve was grossly normal. Pulmonic valve regurgitation is trivial. Aorta: The aortic root and ascending aorta are structurally normal, with no evidence of dilitation. Venous: The inferior vena cava is normal in size with less than 50% respiratory variability, suggesting right atrial pressure of 8 mmHg. IAS/Shunts: No atrial level shunt detected by color flow Doppler.  LEFT VENTRICLE PLAX 2D LVIDd:         4.90 cm LVIDs:         3.20 cm LV PW:         1.30 cm LV IVS:        1.40 cm LVOT diam:     1.90 cm LV SV:         58 LV SV Index:   31 LVOT Area:     2.84 cm  LEFT ATRIUM              Index       RIGHT ATRIUM           Index LA diam:        4.10 cm  2.24 cm/m  RA Area:     31.10 cm LA Vol (A2C):   170.0 ml 92.97 ml/m RA Volume:   115.00 ml 62.89 ml/m LA Vol (A4C):   105.0 ml 57.42 ml/m LA Biplane Vol: 136.0 ml 74.38 ml/m  AORTIC VALVE LVOT Vmax:   126.00 cm/s LVOT Vmean:  86.700 cm/s LVOT VTI:    0.203 m AI PHT:      498 msec  AORTA Ao Root diam: 3.60 cm MITRAL VALVE                TRICUSPID VALVE MV Area (PHT): 3.48 cm     TR Peak grad:   25.2 mmHg MV Decel Time: 218 msec     TR Vmax:        251.00 cm/s MR Peak grad: 134.6 mmHg MR Mean grad: 87.0 mmHg     SHUNTS MR Vmax:      580.00 cm/s   Systemic VTI:  0.20 m MR Vmean:     432.0 cm/s    Systemic Diam: 1.90 cm MV E velocity: 111.00 cm/s Lyman Bishop MD Electronically signed by Lyman Bishop MD Signature Date/Time: 12/25/2020/5:11:22 PM    Final      Scheduled Meds:  amiodarone   200 mg Oral Daily   atorvastatin  40 mg Oral QHS   cloNIDine  0.1 mg Oral BID   diltiazem  300 mg Oral Daily   [START ON 12/27/2020] furosemide  40 mg Oral BID   guaiFENesin  600 mg Oral BID   lisinopril  10 mg Oral Daily   nicotine  14 mg Transdermal Daily   potassium chloride SA  20 mEq Oral Daily   rivaroxaban  20 mg Oral Q supper   sodium chloride flush  3 mL Intravenous Q12H   topiramate  50 mg Oral QHS   Continuous Infusions:  sodium chloride     ampicillin-sulbactam (UNASYN) IV 3 g (12/26/20 0805)     LOS: 1 day    Time spent: 35 mins    Elvie Maines, MD [facility]   If 7PM-7AM, please contact night-coverage progre

## 2020-12-26 NOTE — Progress Notes (Signed)
Mobility Specialist Progress Note    12/26/20 1153  Mobility  Activity Ambulated in hall  Level of Assistance Contact guard assist, steadying assist  Assistive Device  (IV pole)  Distance Ambulated (ft) 100 ft  Mobility Ambulated with assistance in hallway  Mobility Response Tolerated well  Mobility performed by Mobility specialist  Bed Position Chair  $Mobility charge 1 Mobility   RN requested getting pt up. Received sleeping but agreeable. No complaints on walk. X1 short standing break to practice pursed lip breathing. Returned to chair with call bell in reach.   Hildred Alamin Mobility Specialist  Mobility Specialist Phone: [phone]

## 2020-12-27 ENCOUNTER — Inpatient Hospital Stay (HOSPITAL_COMMUNITY): Payer: Medicaid Other | Admitting: Anesthesiology

## 2020-12-27 ENCOUNTER — Encounter (HOSPITAL_COMMUNITY)
Admission: EM | Disposition: A | Discharge status: Home / Self Care | Payer: Self-pay | Source: Home / Self Care | Attending: Internal Medicine

## 2020-12-27 ENCOUNTER — Encounter (HOSPITAL_COMMUNITY): Payer: Self-pay | Admitting: Internal Medicine

## 2020-12-27 ENCOUNTER — Inpatient Hospital Stay (HOSPITAL_COMMUNITY): Payer: Medicaid Other

## 2020-12-27 DIAGNOSIS — I5033 Acute on chronic diastolic (congestive) heart failure: Secondary | ICD-10-CM | POA: Diagnosis not present

## 2020-12-27 DIAGNOSIS — I4892 Unspecified atrial flutter: Secondary | ICD-10-CM | POA: Diagnosis not present

## 2020-12-27 DIAGNOSIS — I351 Nonrheumatic aortic (valve) insufficiency: Secondary | ICD-10-CM

## 2020-12-27 DIAGNOSIS — I340 Nonrheumatic mitral (valve) insufficiency: Secondary | ICD-10-CM

## 2020-12-27 HISTORY — PX: TEE WITHOUT CARDIOVERSION: SHX5443

## 2020-12-27 LAB — BASIC METABOLIC PANEL
Anion gap: 9 (ref 5–15)
BUN: 24 mg/dL — ABNORMAL HIGH (ref 6–20)
CO2: 21 mmol/L — ABNORMAL LOW (ref 22–32)
Calcium: 8.8 mg/dL — ABNORMAL LOW (ref 8.9–10.3)
Chloride: 106 mmol/L (ref 98–111)
Creatinine, Ser: 1.39 mg/dL — ABNORMAL HIGH (ref 0.44–1.00)
GFR, Estimated: 44 mL/min — ABNORMAL LOW (ref >60)
Glucose, Bld: 88 mg/dL (ref 70–99)
Potassium: 3.4 mmol/L — ABNORMAL LOW (ref 3.5–5.1)
Sodium: 136 mmol/L (ref 135–145)

## 2020-12-27 LAB — CBC
HCT: 37.7 % (ref 36.0–46.0)
Hemoglobin: 12 g/dL (ref 12.0–15.0)
MCH: 26.5 pg (ref 26.0–34.0)
MCHC: 31.8 g/dL (ref 30.0–36.0)
MCV: 83.4 fL (ref 80.0–100.0)
Platelets: 167 10*3/uL (ref 150–400)
RBC: 4.52 MIL/uL (ref 3.87–5.11)
RDW: 15.3 % (ref 11.5–15.5)
WBC: 5.4 10*3/uL (ref 4.0–10.5)
nRBC: 0 % (ref 0.0–0.2)

## 2020-12-27 LAB — PHOSPHORUS: Phosphorus: 3.8 mg/dL (ref 2.5–4.6)

## 2020-12-27 LAB — MAGNESIUM: Magnesium: 2.2 mg/dL (ref 1.7–2.4)

## 2020-12-27 SURGERY — ECHOCARDIOGRAM, TRANSESOPHAGEAL
Anesthesia: Monitor Anesthesia Care | Site: Groin

## 2020-12-27 MED ORDER — PROPOFOL 10 MG/ML IV BOLUS
INTRAVENOUS | Status: DC | PRN
  Administered 2020-12-27: 30 mg via INTRAVENOUS
  Administered 2020-12-27: 20 mg via INTRAVENOUS

## 2020-12-27 MED ORDER — LIDOCAINE VISCOUS HCL 2 % MT SOLN
OROMUCOSAL | Status: DC | PRN
  Administered 2020-12-27: 10 mL via OROMUCOSAL

## 2020-12-27 MED ORDER — LIDOCAINE VISCOUS HCL 2 % MT SOLN
OROMUCOSAL | Status: AC
  Filled 2020-12-27: qty 15

## 2020-12-27 MED ORDER — CLONIDINE HCL 0.1 MG PO TABS
0.0500 mg | ORAL_TABLET | Freq: Two times a day (BID) | ORAL | Status: DC
  Administered 2020-12-27 – 2020-12-28 (×3): 0.05 mg via ORAL
  Filled 2020-12-27 (×3): qty 1

## 2020-12-27 MED ORDER — PROPOFOL 500 MG/50ML IV EMUL
INTRAVENOUS | Status: DC | PRN
  Administered 2020-12-27: 150 ug/kg/min via INTRAVENOUS

## 2020-12-27 MED ORDER — FUROSEMIDE 40 MG PO TABS
40.0000 mg | ORAL_TABLET | Freq: Two times a day (BID) | ORAL | Status: DC
  Administered 2020-12-28: 40 mg via ORAL
  Filled 2020-12-27: qty 1

## 2020-12-27 MED ORDER — ALBUTEROL SULFATE HFA 108 (90 BASE) MCG/ACT IN AERS
INHALATION_SPRAY | RESPIRATORY_TRACT | Status: DC | PRN
  Administered 2020-12-27: 4 via RESPIRATORY_TRACT

## 2020-12-27 MED ORDER — POTASSIUM CHLORIDE CRYS ER 20 MEQ PO TBCR
40.0000 meq | EXTENDED_RELEASE_TABLET | ORAL | Status: AC
  Administered 2020-12-27: 40 meq via ORAL
  Filled 2020-12-27: qty 2

## 2020-12-27 MED ORDER — SPIRONOLACTONE 25 MG PO TABS
25.0000 mg | ORAL_TABLET | Freq: Every day | ORAL | Status: DC
  Administered 2020-12-27 – 2020-12-28 (×2): 25 mg via ORAL
  Filled 2020-12-27 (×2): qty 1

## 2020-12-27 MED ORDER — LIDOCAINE 2% (20 MG/ML) 5 ML SYRINGE
INTRAMUSCULAR | Status: DC | PRN
  Administered 2020-12-27: 60 mg via INTRAVENOUS

## 2020-12-27 NOTE — Discharge Instructions (Signed)
Information on my medicine - XARELTO (Rivaroxaban)  This medication education was reviewed with me or my healthcare representative as part of my discharge preparation. You were taking this medication prior to this hospital admission.    Why was Xarelto prescribed for you? Xarelto was prescribed for you to reduce the risk of a blood clot forming that can cause a stroke if you have a medical condition called atrial fibrillation (a type of irregular heartbeat).  What do you need to know about xarelto ? Take your Xarelto ONCE DAILY at the same time every day with your evening meal. If you have difficulty swallowing the tablet whole, you may crush it and mix in applesauce just prior to taking your dose.  Take Xarelto exactly as prescribed by your doctor and DO NOT stop taking Xarelto without talking to the doctor who prescribed the medication.  Stopping without other stroke prevention medication to take the place of Xarelto may increase your risk of developing a clot that causes a stroke.  Refill your prescription before you run out.  After discharge, you should have regular check-up appointments with your healthcare provider that is prescribing your Xarelto.  In the future your dose may need to be changed if your kidney function or weight changes by a significant amount.  What do you do if you miss a dose? If you are taking Xarelto ONCE DAILY and you miss a dose, take it as soon as you remember on the same day then continue your regularly scheduled once daily regimen the next day. Do not take two doses of Xarelto at the same time or on the same day.   Important Safety Information A possible side effect of Xarelto is bleeding. You should call your healthcare provider right away if you experience any of the following: Bleeding from an injury or your nose that does not stop. Unusual colored urine (red or dark brown) or unusual colored stools (red or black). Unusual bruising for unknown  reasons. A serious fall or if you hit your head (even if there is no bleeding).  Some medicines may interact with Xarelto and might increase your risk of bleeding while on Xarelto. To help avoid this, consult your healthcare provider or pharmacist prior to using any new prescription or non-prescription medications, including herbals, vitamins, non-steroidal anti-inflammatory drugs (NSAIDs) and supplements.  This website has more information on Xarelto: [email or web address]

## 2020-12-27 NOTE — Progress Notes (Signed)
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  Also having difficulty swallowing and hoarseness of voice was referred to ENT but has not made an appointment yet In the ED UDS positive for cocaine chest x-ray with mild patchy infiltrate of the left lung EKG with A. fib with RVR, underwent CT angio chest no PE but multifocal pneumonia soon, placed on IV antibiotics cardiology consulted patient was admitted  Subjective: Aaox3, no new complaints Waiting for TEE cardoversion today jist took some kdur This morning no swallowing difficulties, not in distress. Assessment & Plan:  Atrial flutter with rapid ventricular response: Previous ablation in 2018, presenting with recurrent a flutter rate now controlled on oral Cardizem off IV, home dose increased from 180 to 300 mg, continue Xarelto, planning for TEE cardioversion at 3 PM today.  On  amiodarone, Continue plan as per cardiology, continue cocaine cessation.  Acute on chronic diastolic CHF: Continue on oral Lasix Aldactone currently euvolemic.  Monitor volume status. Filed Weights   12/26/20 0600 12/27/20 0400  Weight: 71.8 kg 72.3 kg   Community-acquired pneumonia/aspiration pneumonia: CT NG 10/5.  Bilateral multifocal patchy densities compatible with multifocal pneumonia follow-up imaging 3-4 wks suggested to exclude underlying malignancy.  Chest x-ray concerning for community-acquired pneumonia also having cough with sputum but no fevers.  WBC count and procalcitonin reassuring.  Continue Unasyn. Recent Labs  Lab 12/25/20 0305 12/25/20 1223 12/27/20 0314  WBC 7.3  --  5.4  PROCALCITON  --   <0.10  --     Acute hypoxic respiratory failure in the setting of #2 and #3.  Continue supplemental oxygen intermittent saturation at least 95% or above.  Essential hypertension Hypertension urgency:  on lisinopril clonidine and Cardizem at home.  Blood pressure is stable.  At this time cardiology planning to monitor,And restart the next day or 2, started on Aldactone, continue lisinopril, increase dose of Cardizem  Hypokalemia repleting with 40x1 and continue 20 M EQ daily  HLD: continue Lipitor.  Dysphagia/hoarseness of voice with history of pharyngeal mass- pt endorses intermittetn dysphagia and hoarseness for few years, saw ENT in 2020. MBS here showed "mass-like protrusion on the anterior laryngeal wall and at least one arytenoid appears enlarged-things have been noted since 2020 and some edema was suspected" , I paged ENT office and left patient's details for call back from oncall ENT MD. To discuss further- inapteitn work up vs OP work up.  Discussed with ENT on-call advised to get routine CT soft tissue neck with contrast and they will arrange for outpatient follow-up CT neck soft tissue from 12/20/2018 " Again demonstrated are findings consistent with acute pharyngitis, tonsillitis and supraglottitis. Although swelling of the epiglottis and lingual tonsils has decreased, the remainder of the supraglottic swelling is similar or even subtly increased. Associated narrowing of the supraglottic airway appears progressed, now moderate. No organized drainable collection identified. Associated masslike thickening and edema of the hypopharynx at the level of the piriform sinus appears similar to prior exam, and likely reflects acute inflammation, although underlying mass could have this appearance. ENT follow-up with imaging follow-up to resolution recommended. 2. Persistent bilateral cervical lymphadenopathy, likely reactive"  Cocaine/tobacco abuse cessation advised  Stage IIIa CKD: Creatinine  at baseline Recent Labs  Lab 12/25/20 0305 12/26/20 0232 12/27/20 0314  BUN 22* 16 24*  CREATININE 1.19* 1.37* 1.39*    Anemia likely from chronic kidney disease.  Monitor hemoglobin  Hx of CVA- cont xarelto, statins  Diet Order             Diet NPO time specified Except for: Sips with Meds  Diet effective midnight                 DVT prophylaxis: XARELTO Code Status:   Code Status: Full Code  Family Communication: plan of care discussed with patient at bedside. Status is: Inpatient Remains inpatient appropriate because:Ongoing diagnostic testing needed not appropriate for outpatient work up and Inpatient level of care appropriate due to severity of illness  Dispo: The patient is from: Home              Anticipated d/c is to: Home              Patient currently is not medically stable to d/c.   Difficult to place patient No  Objective: Vitals: Today's Vitals   12/27/20 0001 12/27/20 0400 12/27/20 0450 12/27/20 0732  BP: 118/75  123/82 (!) 135/99  Pulse: 72   99  Resp: (!) 22  19 19   Temp: 98.1 F (36.7 C)  98.1 F (36.7 C) 97.8 F (36.6 C)  TempSrc: Oral  Oral Oral  SpO2: 98%  98% 100%  Weight:  72.3 kg    PainSc:       Physical Examination: General exam: AA0X3, weak,older than stated age. HEENT:Oral mucosa moist, Ear/Nose WNL grossly,dentition normal. Respiratory system: B/l CLEAR BS, no use of accessory muscle, non tender. Cardiovascular system: S1 & S2 +,No JVD. Gastrointestinal system: Abdomen soft, NT,ND, BS+. Nervous System:Alert, awake, moving extremities. Extremities: Leg edema NONE, distal peripheral pulses palpable.  Skin: No rashes, no icterus. MSK: Normal muscle bulk,tone, power.  Medications reviewed:  Scheduled Meds:  amiodarone  200 mg Oral Daily   atorvastatin  40 mg Oral QHS   cloNIDine  0.05 mg Oral BID   diltiazem  300 mg Oral Daily   [START ON 12/28/2020] furosemide  40 mg Oral BID   guaiFENesin  600 mg Oral BID   lisinopril  10  mg Oral Daily   nicotine  14 mg Transdermal Daily   potassium chloride SA  20 mEq Oral Daily   rivaroxaban  20 mg Oral Q supper   sodium chloride flush  3 mL Intravenous Q12H   spironolactone  25 mg Oral Daily   topiramate  50 mg Oral QHS   Continuous Infusions:  sodium chloride     sodium chloride 20 mL/hr at 12/27/20 0449   ampicillin-sulbactam (UNASYN) IV 3 g (12/27/20 0901)    Intake/Output  Intake/Output Summary (Last 24 hours) at 12/27/2020 1132 Last data filed at 12/27/2020 0935 Gross per 24 hour  Intake 926.82 ml  Output 500 ml  Net 426.82 ml   Intake/Output from previous day: 10/06 0701 - 10/07 0700 In: 926.8 [P.O.:460; I.V.:216.8; IV Piggyback:250] Out: 300 [Urine:300] Net IO Since Admission: -1,703.22 mL [12/27/20 1132]   Weight change: 0.5 kg  Wt Readings from Last 3 Encounters:  12/27/20 72.3 kg  12/24/20 73.5 kg  09/18/20 75.7 kg     Consultants: Cardiology  Procedures:see note Antimicrobials: Anti-infectives (From admission, onward)    Start     Dose/Rate Route Frequency Ordered Stop   12/25/20 2130  Ampicillin-Sulbactam (  UNASYN) 3 g in sodium chloride 0.9 % 100 mL IVPB        3 g 200 mL/hr over 30 Minutes Intravenous Every 6 hours 12/25/20 2044        Culture/Microbiology    Component Value Date/Time   SDES IN/OUT CATH URINE 12/16/2018 2036   [place] NONE 12/16/2018 2036   CULT  12/16/2018 2036    NO GROWTH Performed at [facility] [address] [place], [place] [address]    REPTSTATUS 12/18/2018 FINAL 12/16/2018 2036    Other culture-see note  Unresulted Labs (From admission, onward)     Start     Ordered   12/26/20 0998  Basic metabolic panel  Daily,   R      12/25/20 0732          Data Reviewed: I have personally reviewed following labs and imaging studies CBC: Recent Labs  Lab 12/25/20 0305 12/27/20 0314  WBC 7.3 5.4  NEUTROABS 3.6  --   HGB 11.9* 12.0  HCT 37.0 37.7  MCV 83.9 83.4  PLT 166 338    Basic Metabolic Panel: Recent Labs  Lab 12/25/20 0305 12/25/20 0630 12/26/20 0232 12/27/20 0314  NA 138  --  135 136  K 3.8  --  3.3* 3.4*  CL 110  --  105 106  CO2 20*  --  20* 21*  GLUCOSE 108*  --  112* 88  BUN 22*  --  16 24*  CREATININE 1.19*  --  1.37* 1.39*  CALCIUM 8.8*  --  9.1 8.8*  MG  --  2.1  --  2.2  PHOS  --   --   --  3.8   GFR: Estimated Creatinine Clearance: 45.5 mL/min (A) (by C-G formula based on SCr of 1.39 mg/dL (H)). Liver Function Tests: Recent Labs  Lab 12/25/20 1223  AST 28  ALT 32  ALKPHOS 87  BILITOT 1.1  PROT 7.4  ALBUMIN 3.6   No results for input(s): LIPASE, AMYLASE in the last 168 hours. No results for input(s): AMMONIA in the last 168 hours. Coagulation Profile: Recent Labs  Lab 12/26/20 2011  INR 2.8*   Cardiac Enzymes: No results for input(s): CKTOTAL, CKMB, CKMBINDEX, TROPONINI in the last 168 hours. BNP (last 3 results) Recent Labs    12/17/20 1044  PROBNP 2,714*   HbA1C: Recent Labs    12/26/20 0232  HGBA1C 5.2   CBG: No results for input(s): GLUCAP in the last 168 hours. Lipid Profile: Recent Labs    12/26/20 0232  CHOL 152  HDL 48  LDLCALC 94  TRIG 51  CHOLHDL 3.2   Thyroid Function Tests: Recent Labs    12/25/20 0630  TSH 2.038   Anemia Panel: No results for input(s): VITAMINB12, FOLATE, FERRITIN, TIBC, IRON, RETICCTPCT in the last 72 hours. Sepsis Labs: Recent Labs  Lab 12/25/20 1223  PROCALCITON <0.10    Recent Results (from the past 240 hour(s))  Resp Panel by RT-PCR (Flu A&B, Covid) Nasopharyngeal Swab     Status: None   Collection Time: 12/25/20  3:05 AM   Specimen: Nasopharyngeal Swab; Nasopharyngeal(NP) swabs in vial transport medium  Result Value Ref Range Status   SARS Coronavirus 2 by RT PCR NEGATIVE NEGATIVE Final    Comment: (NOTE) SARS-CoV-2 target nucleic acids are NOT DETECTED.  The SARS-CoV-2 RNA is generally detectable in upper respiratory specimens during the acute  phase of infection. The lowest concentration of SARS-CoV-2 viral copies this assay can  detect is 138 copies/mL. A negative result does not preclude SARS-Cov-2 infection and should not be used as the sole basis for treatment or other patient management decisions. A negative result may occur with  improper specimen collection/handling, submission of specimen other than nasopharyngeal swab, presence of viral mutation(s) within the areas targeted by this assay, and inadequate number of viral copies(<138 copies/mL). A negative result must be combined with clinical observations, patient history, and epidemiological information. The expected result is Negative.  Fact Sheet for Patients:  EntrepreneurPulse.com.au  Fact Sheet for Healthcare Providers:  IncredibleEmployment.be  This test is no t yet approved or cleared by the [place] FDA and  has been authorized for detection and/or diagnosis of SARS-CoV-2 by FDA under an Emergency Use Authorization (EUA). This EUA will remain  in effect (meaning this test can be used) for the duration of the COVID-19 declaration under Section 564(b)(1) of the Act, 21 U.S.C.section 360bbb-3(b)(1), unless the authorization is terminated  or revoked sooner.       Influenza A by PCR NEGATIVE NEGATIVE Final   Influenza B by PCR NEGATIVE NEGATIVE Final    Comment: (NOTE) The Xpert Xpress SARS-CoV-2/FLU/RSV plus assay is intended as an aid in the diagnosis of influenza from Nasopharyngeal swab specimens and should not be used as a sole basis for treatment. Nasal washings and aspirates are unacceptable for Xpert Xpress SARS-CoV-2/FLU/RSV testing.  Fact Sheet for Patients: EntrepreneurPulse.com.au  Fact Sheet for Healthcare Providers: IncredibleEmployment.be  This test is not yet approved or cleared by the [place] FDA and has been authorized for detection and/or diagnosis of  SARS-CoV-2 by FDA under an Emergency Use Authorization (EUA). This EUA will remain in effect (meaning this test can be used) for the duration of the COVID-19 declaration under Section 564(b)(1) of the Act, 21 U.S.C. section 360bbb-3(b)(1), unless the authorization is terminated or revoked.  Performed at [facility], [place] [address], [place], [place] [address]      Radiology Studies: CT Angio Chest Pulmonary Embolism (PE) W or WO Contrast  Result Date: 12/25/2020 CLINICAL DATA:  PE suspected. High probability. Chest pain and shortness of breath. Lower extremity swelling. EXAM: CT ANGIOGRAPHY CHEST WITH CONTRAST TECHNIQUE: Multidetector CT imaging of the chest was performed using the standard protocol during bolus administration of intravenous contrast. Multiplanar CT image reconstructions and MIPs were obtained to evaluate the vascular anatomy. CONTRAST:  29mL OMNIPAQUE IOHEXOL 350 MG/ML SOLN COMPARISON:  03/08/2018 FINDINGS: Cardiovascular: Cardiac enlargement. Passive venous congestion with reflux of contrast into the IVC and hepatic veins noted. No evidence of acute pulmonary embolus. Aortic atherosclerosis.  Coronary artery calcifications. Mediastinum/Nodes: No enlarged mediastinal, hilar, or axillary lymph nodes. Thyroid gland, trachea, and esophagus demonstrate no significant findings. Lungs/Pleura: No pleural effusion. Bilateral multifocal patchy airspace densities are identified. Most severe within the lingula, image 76/6. There also scattered areas of subsegmental atelectasis. Mild paraseptal and centrilobular emphysema. Upper Abdomen: No acute abnormality. Musculoskeletal: No chest wall abnormality. No acute or significant osseous findings. Review of the MIP images confirms the above findings. IMPRESSION: 1. No evidence for acute pulmonary embolus. 2. Bilateral multifocal patchy airspace densities are identified compatible with multifocal pneumonia. Followup imaging is recommended in  3-4 weeks following trial of antibiotic therapy to ensure resolution and exclude underlying malignancy. 3. Cardiac enlargement with passive venous congestion suggesting right heart failure. 4. Aortic Atherosclerosis (ICD10-I70.0) and Emphysema (ICD10-J43.9). Coronary artery calcifications Electronically Signed   By: Kerby Moors M.D.   On: 12/25/2020 13:57  DG CHEST PORT 1 VIEW  Result Date: 12/26/2020 CLINICAL DATA:  Shortness of breath and fever EXAM: PORTABLE CHEST 1 VIEW COMPARISON:  12/25/2020 FINDINGS: Cardiomegaly as seen previously. Tortuous aorta. Bilateral patchy pulmonary infiltrates persists, with some worsening particularly in the left mid lung. No dense consolidation or lobar collapse. No effusion. No acute bone finding. IMPRESSION: Cardiomegaly. Aortic atherosclerosis. Patchy bilateral pneumonia, left worse than right, with some radiographic worsening in the left mid lung. Electronically Signed   By: Nelson Chimes M.D.   On: 12/26/2020 13:19   DG Swallowing Func-Speech Pathology  Result Date: 12/26/2020 Table formatting from the original result was not included. Images from the original result were not included. Objective Swallowing Evaluation: Type of Study: Bedside Swallow Evaluation  Patient Details Name: Morgan Bean MRN: [id number] Date of Birth: 09-Aug-1965 Today's Date: 12/26/2020 Time: SLP Start Time (ACUTE ONLY): 0830 -SLP Stop Time (ACUTE ONLY): 0847 SLP Time Calculation (min) (ACUTE ONLY): 17 min Past Medical History: Past Medical History: Diagnosis Date  Anemia of other chronic disease 11/13/2013  Anginal pain ([facility])   none in past year  Anxiety   Asthma   patient denies  Candidiasis 03/30/15  Chronic diastolic CHF (congestive heart failure) ([name])   Constipation   Coronary artery disease   Lexiscan myoview (10/12) with significant ST depression upon Lexiscan injection but no ischemia or infarction on perfusion images. Left heart cath (10/12): 30% mid LAD, 30% ostial D1, 50% ostial D2.     Cough 11/13/2013  Depression   Headache 02/22/2014  Hx of cardiovascular stress test   Lexiscan Myoview (2/16): Breast attenuation artifact, no ischemia, EF 64%; low risk  Hyperlipemia   Hypertension   Iron deficiency   hx of  Leukocytoclastic vasculitis ([name])   Rash across lower body, occurred in 9/11, diagnosed by skin biopsy. ANA positive. Thought to be secondary to cocaine use.  Leukopenia 03/21/2013  Lymphadenopathy 03/21/2013  Lymphadenopathy 01/30/2014  Mental disorder   Pancytopenia ([place])   Pancytopenia, acquired ([place]) 07/16/2015  Polysubstance abuse ([place])   Prior cocaine  Pulmonary HTN ([place])   Echo (9/11) with EF 65%, mild LVH, mild AI, mild MR, moderate to possibly severe TR with PA systolic pressure 52 mmHg. Echo (10/12): severe LV hypertrophy, EF 60-65%, mild MR, mild AI, moderate to severe tricuspid regurgitation, PA systolic pressure 38 mmHg.  Echo 4/14: moderate LVH, EF 65%, normal wall motion, diastolic dysfunction, mild AI, mild MR  Shortness of breath   a. PFTs 5/14:  FEV1/FVC 99% predicted; FVC 60% predicted; DLCO mildly reduced, mild restriction, air trapping.;  b.  seen by pulmo (Dr. [name]) 5/14: mainly upper airway symptoms - ACE d/c'd (sx's better)  Stroke [facility]) 2010ish  Syphilis 04/25/2013  Tobacco abuse   Tricuspid regurgitation   Unspecified deficiency anemia 03/29/2013 Past Surgical History: Past Surgical History: Procedure Laterality Date  ANKLE SURGERY    right  BONE MARROW ASPIRATION    CARDIAC CATHETERIZATION    CARDIAC CATHETERIZATION N/A 04/23/2015  Procedure: Right Heart Cath;  Surgeon: [name], MD;  Location: [facility];  Service: Cardiovascular;  Laterality: N/A;  CARDIOVERSION N/A 03/11/2018  Procedure: CARDIOVERSION;  Surgeon: [name], MD;  Location: [facility] ENDOSCOPY;  Service: Cardiovascular;  Laterality: N/A;  I & D EXTREMITY  08/09/2011  Procedure: IRRIGATION AND DEBRIDEMENT EXTREMITY;  Surgeon: [name], MD;  Location: [facility];  Service: General;   Laterality: Left;  i & D Left axilla abscess  LYMPH NODE BIOPSY N/A 04/05/2013  Procedure: EXCISIONAL BIOPSY RIGHT SUBMANDIBULAR NODE, NASAL ENDOSCOPIC WITH BIOPSY NASAL PHARYNX;  Surgeon: [name], MD;  Location: [facility];  Service: ENT;  Laterality: N/A;  MULTIPLE EXTRACTIONS WITH ALVEOLOPLASTY N/A 03/03/2019  Procedure: MULTIPLE EXTRACTION WITH ALVEOLOPLASTY;  Surgeon: [name], DDS;  Location: [facility];  Service: Oral Surgery;  Laterality: N/A;  TEE WITHOUT CARDIOVERSION N/A 03/11/2018  Procedure: TRANSESOPHAGEAL ECHOCARDIOGRAM (TEE);  Surgeon: [name], MD;  Location: [facility] ENDOSCOPY;  Service: Cardiovascular;  Laterality: N/A; HPI: Pt is a 55 y.o. female who presented with complaints of shortness of breath that had been progressively worsening two weeks prior to admission. CXR 10/5: Mild patchy infiltrate in the left mid lung. Pt reported to referring MD having intermittent difficulty swallowing and getting choked/coughing when eating. PMH: hypertension, hyperlipidemia, paroxysmal atrial fibrillation/atrial flutter on Xarelto, HFpEF, MR, CVA, CAD, COPD, cocaine abuse, and tobacco abuse. CT scans from 2020 did note concern for pharyngitis, tonsillitis, and supraglottitis with associated masslike thickening and edema of the hypopharynx at the level of the piriformis which could reflect acute inflammation versus underlying mass. BSE 12/18/18: mild oral dysphagia with prolonged oral phase with regular texture solids; pt discharged from SLP services on 9/28 on dysphagia 3/thin. Outpatient laryngoscopy 40/3/47 wih Dr. Erik Obey: "Nasopharynx is clear. Oropharynx is clear. Hypopharynx/larynx shows thickening of the epiglottis, thickening of the right arytenoid and somewhat narrow supraglottis. Vocal cords are mobile and without lesions. Her larynx is abnormal although the pattern is difficult to identify. I wonder if this could be related to levamisole toxicity from her cocaine use."  Assessment / Plan /  Recommendation CHL IP CLINICAL IMPRESSIONS 12/26/2020 Clinical Impression Pt presents with oropharyngeal dysphagia characterized by reduced tongue base retraction, impaired bolus cohesion, reduced laryngeal elevation, reduced anterior laryngeal movement, and a pharyngeal delay. The swallow was often triggered with the head of the bolus at the level of the pyriform sinuses. Penetration (PAS 3,5) was noted with thin liquids via cup and straw. Prompted coughing was effective in expelling the penetrate and laryngeal invasion was eliminated with cued reduction in bolus size. Pharyngeal clearance was [facility] and no esophageal stasis noted. However, pt demonstrated an effortful swallow with nectar thick liquids and with solids. Pt reported that she needed to do this "to get it down". Arytenoid(s) appear(s) enlarged and R arytenoid thickening was noted on laryngoscopy on 12/23/18. Anterior pharyngeal wall thickening/convex posterior protrusion (See image below) was noted at the level of C3-C5 which aligns with the "masslike thickening and edema of the hypopharynx at the level of the pyriform sinus" noted on the CT soft tissue on 12/20/18. Considering pt's history, and her reports of worsening dysphagia, SLP is in agreement with the repeat CT soft tissue ordered by Dr. Tamala Julian on 12/25/20, and recommends consideration of otolaryngology consult. Pt's current diet of dysphagia 3 solids and thin liquids will be continued with observance of swallowing precautions, and SLP will continue to follow pt. SLP Visit Diagnosis Dysphagia, oropharyngeal phase (R13.12) Attention and concentration deficit following -- Frontal lobe and executive function deficit following -- Impact on safety and function Mild aspiration risk    CHL IP TREATMENT RECOMMENDATION 12/26/2020 Treatment Recommendations Therapy as outlined in treatment plan below   Prognosis 12/26/2020 Prognosis for Safe Diet Advancement Fair Barriers to Reach Goals Time post onset  Barriers/Prognosis Comment -- CHL IP DIET RECOMMENDATION 12/26/2020 SLP Diet Recommendations Dysphagia 3 (Mech soft) solids;Thin liquid Liquid Administration via Cup;No straw Medication Administration Whole meds with puree Compensations Slow rate;Small sips/bites Postural Changes Seated  upright at 90 degrees   No flowsheet data found.  CHL IP FOLLOW UP RECOMMENDATIONS 12/26/2020 Follow up Recommendations None   CHL IP FREQUENCY AND DURATION 12/26/2020 Speech Therapy Frequency (ACUTE ONLY) min 2x/week Treatment Duration 2 weeks      CHL IP ORAL PHASE 12/26/2020 Oral Phase Impaired Oral - Pudding Teaspoon -- Oral - Pudding Cup -- Oral - Honey Teaspoon -- Oral - Honey Cup -- Oral - Nectar Teaspoon -- Oral - Nectar Cup -- Oral - Nectar Straw Decreased bolus cohesion;Premature spillage Oral - Thin Teaspoon -- Oral - Thin Cup Decreased bolus cohesion;Premature spillage Oral - Thin Straw Decreased bolus cohesion;Premature spillage Oral - Puree WFL Oral - Mech Soft WFL Oral - Regular -- Oral - Multi-Consistency -- Oral - Pill Decreased bolus cohesion;Premature spillage Oral Phase - Comment --  CHL IP PHARYNGEAL PHASE 12/26/2020 Pharyngeal Phase Impaired Pharyngeal- Pudding Teaspoon -- Pharyngeal -- Pharyngeal- Pudding Cup -- Pharyngeal -- Pharyngeal- Honey Teaspoon -- Pharyngeal -- Pharyngeal- Honey Cup -- Pharyngeal -- Pharyngeal- Nectar Teaspoon -- Pharyngeal -- Pharyngeal- Nectar Cup -- Pharyngeal -- Pharyngeal- Nectar Straw Delayed swallow initiation-vallecula Pharyngeal -- Pharyngeal- Thin Teaspoon -- Pharyngeal -- Pharyngeal- Thin Cup Delayed swallow initiation-pyriform sinuses;Penetration/Aspiration during swallow Pharyngeal Material enters airway, remains ABOVE vocal cords and not ejected out Pharyngeal- Thin Straw Delayed swallow initiation-pyriform sinuses;Penetration/Aspiration during swallow Pharyngeal Material enters airway, CONTACTS cords and not ejected out Pharyngeal- Puree WFL Pharyngeal -- Pharyngeal-  Mechanical Soft WFL Pharyngeal -- Pharyngeal- Regular -- Pharyngeal -- Pharyngeal- Multi-consistency -- Pharyngeal -- Pharyngeal- Pill Delayed swallow initiation-pyriform sinuses;Penetration/Aspiration during swallow Pharyngeal Material enters airway, remains ABOVE vocal cords and not ejected out Pharyngeal Comment --  Shanika I. Hardin Negus, [place], [place] Office number [phone] Pager [phone] Horton Marshall 12/26/2020, 10:22 AM              ECHOCARDIOGRAM COMPLETE  Result Date: 12/25/2020    ECHOCARDIOGRAM REPORT   Patient Name:   Morgan Bean Date of Exam: 12/25/2020 Medical Rec #:  [id number]    Height:       66.0 in Accession #:    [id number]   Weight:       162.0 lb Date of Birth:  10/19/1965    BSA:          1.828 m Patient Age:    67 years     BP:           158/117 mmHg Patient Gender: F            HR:           91 bpm. Exam Location:  Inpatient Procedure: 2D Echo, Cardiac Doppler and Color Doppler Indications:    Atrial Fibrillation I48.91  History:        Patient has prior history of Echocardiogram examinations, most                 recent 01/25/2019. CHF, CAD, COPD and Stroke, Arrythmias:Atrial                 Flutter; Risk Factors:Hypertension and Current Smoker. Mitral                 regurgitation. Mild pulmonary hypertension. Atrial flutter s/p                 ablation. Obstructive sleep apnea (CPAP). Polysubstance abuse.  Sonographer:    Alvino Chapel RCS Referring Phys: [id number] RONDELL A SMITH IMPRESSIONS  1. Left ventricular ejection fraction, by estimation, is 60  to 65%. The left ventricle has normal function. The left ventricle has no regional wall motion abnormalities. There is mild left ventricular hypertrophy. Left ventricular diastolic function could not be evaluated.  2. Right ventricular systolic function is normal. The right ventricular size is normal. There is normal pulmonary artery systolic pressure. The estimated right ventricular systolic pressure  is 36.1 mmHg.  3. Left atrial size was severely dilated.  4. Right atrial size was severely dilated.  5. The mitral valve is abnormal. Moderate mitral valve regurgitation. Moderate mitral annular calcification.  6. The tricuspid valve is abnormal. Tricuspid valve regurgitation is moderate.  7. The aortic valve is tricuspid. Aortic valve regurgitation is moderate. Mild aortic valve sclerosis is present, with no evidence of aortic valve stenosis. Aortic regurgitation PHT measures 498 msec.  8. The inferior vena cava is normal in size with <50% respiratory variability, suggesting right atrial pressure of 8 mmHg. Comparison(s): Changes from prior study are noted. 01/25/2019: LVEF 60-65%, mild MR, mild to moderate AI, mild TR. FINDINGS  Left Ventricle: Left ventricular ejection fraction, by estimation, is 60 to 65%. The left ventricle has normal function. The left ventricle has no regional wall motion abnormalities. The left ventricular internal cavity size was normal in size. There is  mild left ventricular hypertrophy. Left ventricular diastolic function could not be evaluated due to atrial fibrillation. Left ventricular diastolic function could not be evaluated. Right Ventricle: The right ventricular size is normal. No increase in right ventricular wall thickness. Right ventricular systolic function is normal. There is normal pulmonary artery systolic pressure. The tricuspid regurgitant velocity is 2.51 m/s, and  with an assumed right atrial pressure of 8 mmHg, the estimated right ventricular systolic pressure is 44.3 mmHg. Left Atrium: Left atrial size was severely dilated. Right Atrium: Right atrial size was severely dilated. Pericardium: There is no evidence of pericardial effusion. Mitral Valve: The mitral valve is abnormal. There is moderate calcification of the anterior and posterior mitral valve leaflet(s). Moderate mitral annular calcification. Moderate mitral valve regurgitation. Tricuspid Valve: The tricuspid  valve is abnormal. Tricuspid valve regurgitation is moderate. Aortic Valve: The aortic valve is tricuspid. Aortic valve regurgitation is moderate. Aortic regurgitation PHT measures 498 msec. Mild aortic valve sclerosis is present, with no evidence of aortic valve stenosis. Pulmonic Valve: The pulmonic valve was grossly normal. Pulmonic valve regurgitation is trivial. Aorta: The aortic root and ascending aorta are structurally normal, with no evidence of dilitation. Venous: The inferior vena cava is normal in size with less than 50% respiratory variability, suggesting right atrial pressure of 8 mmHg. IAS/Shunts: No atrial level shunt detected by color flow Doppler.  LEFT VENTRICLE PLAX 2D LVIDd:         4.90 cm LVIDs:         3.20 cm LV PW:         1.30 cm LV IVS:        1.40 cm LVOT diam:     1.90 cm LV SV:         58 LV SV Index:   31 LVOT Area:     2.84 cm  LEFT ATRIUM              Index       RIGHT ATRIUM           Index LA diam:        4.10 cm  2.24 cm/m  RA Area:     31.10 cm LA Vol (A2C):   170.0 ml  92.97 ml/m RA Volume:   115.00 ml 62.89 ml/m LA Vol (A4C):   105.0 ml 57.42 ml/m LA Biplane Vol: 136.0 ml 74.38 ml/m  AORTIC VALVE LVOT Vmax:   126.00 cm/s LVOT Vmean:  86.700 cm/s LVOT VTI:    0.203 m AI PHT:      498 msec  AORTA Ao Root diam: 3.60 cm MITRAL VALVE                TRICUSPID VALVE MV Area (PHT): 3.48 cm     TR Peak grad:   25.2 mmHg MV Decel Time: 218 msec     TR Vmax:        251.00 cm/s MR Peak grad: 134.6 mmHg MR Mean grad: 87.0 mmHg     SHUNTS MR Vmax:      580.00 cm/s   Systemic VTI:  0.20 m MR Vmean:     432.0 cm/s    Systemic Diam: 1.90 cm MV E velocity: 111.00 cm/s Lyman Bishop MD Electronically signed by Lyman Bishop MD Signature Date/Time: 12/25/2020/5:11:22 PM    Final      LOS: 2 days   Antonieta Pert, MD [facility]  12/27/2020, 11:32 AM

## 2020-12-27 NOTE — Anesthesia Preprocedure Evaluation (Addendum)
Anesthesia Evaluation  Patient identified by MRN, date of birth, ID band Patient awake    Reviewed: Allergy & Precautions, NPO status , Patient's Chart, lab work & pertinent test results  Airway Mallampati: II  TM Distance: >3 FB Neck ROM: Full    Dental  (+) Upper Dentures, Lower Dentures   Pulmonary asthma , COPD,  COPD inhaler, Current Smoker and Patient abstained from smoking.,    Pulmonary exam normal        Cardiovascular hypertension, Pt. on medications + CAD, +CHF and + DOE  + dysrhythmias Atrial Fibrillation + Valvular Problems/Murmurs MR  Rhythm:Irregular Rate:Normal     Neuro/Psych  Headaches, Anxiety Depression TIACVA    GI/Hepatic negative GI ROS, (+)     substance abuse  cocaine use,   Endo/Other  negative endocrine ROS  Renal/GU negative Renal ROS  negative genitourinary   Musculoskeletal negative musculoskeletal ROS (+)   Abdominal (+)  Abdomen: soft.    Peds  Hematology  (+) anemia ,   Anesthesia Other Findings   Reproductive/Obstetrics                            Anesthesia Physical Anesthesia Plan  ASA: 3  Anesthesia Plan: MAC   Post-op Pain Management:    Induction: Intravenous  PONV Risk Score and Plan: 1 and Propofol infusion and Treatment may vary due to age or medical condition  Airway Management Planned: Simple Face Mask, Natural Airway and Nasal Cannula  Additional Equipment: None  Intra-op Plan:   Post-operative Plan:   Informed Consent: I have reviewed the patients History and Physical, chart, labs and discussed the procedure including the risks, benefits and alternatives for the proposed anesthesia with the patient or authorized representative who has indicated his/her understanding and acceptance.     Dental advisory given  Plan Discussed with: CRNA  Anesthesia Plan Comments:        Anesthesia Quick Evaluation

## 2020-12-27 NOTE — Progress Notes (Signed)
Progress Note  Patient Name: Morgan Bean Date of Encounter: 12/27/2020  [facility] Cardiologist: [name], MD   Subjective   Feeling well.  Her breathing is much better today.  She can walk without SOB.   Inpatient Medications    Scheduled Meds:  amiodarone  200 mg Oral Daily   atorvastatin  40 mg Oral QHS   cloNIDine  0.05 mg Oral BID   diltiazem  300 mg Oral Daily   [START ON 12/28/2020] furosemide  40 mg Oral BID   guaiFENesin  600 mg Oral BID   lisinopril  10 mg Oral Daily   nicotine  14 mg Transdermal Daily   potassium chloride SA  20 mEq Oral Daily   potassium chloride  40 mEq Oral STAT   rivaroxaban  20 mg Oral Q supper   sodium chloride flush  3 mL Intravenous Q12H   spironolactone  25 mg Oral Daily   topiramate  50 mg Oral QHS   Continuous Infusions:  sodium chloride     sodium chloride 20 mL/hr at 12/27/20 0449   ampicillin-sulbactam (UNASYN) IV 3 g (12/27/20 0452)   PRN Meds: sodium chloride, acetaminophen, hydrALAZINE, nitroGLYCERIN, ondansetron (ZOFRAN) IV, sodium chloride flush   Vital Signs    Vitals:   12/27/20 0001 12/27/20 0400 12/27/20 0450 12/27/20 0732  BP: 118/75  123/82 (!) 135/99  Pulse: 72   99  Resp: (!) 22  19 19   Temp: 98.1 F (36.7 C)  98.1 F (36.7 C) 97.8 F (36.6 C)  TempSrc: Oral  Oral Oral  SpO2: 98%  98% 100%  Weight:  72.3 kg      Intake/Output Summary (Last 24 hours) at 12/27/2020 0843 Last data filed at 12/27/2020 0452 Gross per 24 hour  Intake 926.82 ml  Output 300 ml  Net 626.82 ml   Last 3 Weights 12/27/2020 12/26/2020 12/24/2020  Weight (lbs) 159 lb 6.3 oz 158 lb 4.6 oz 162 lb  Weight (kg) 72.3 kg 71.8 kg 73.483 kg  Some encounter information is confidential and restricted. Go to Review Flowsheets activity to see all data.      Telemetry    Atrial flutter.  Rates mostly less than 100 bpm- Personally Reviewed  ECG    12/25/2020: Atrial flutter.  Rate 110 bpm.- Personally Reviewed  Physical Exam    GEN: No acute distress.   Neck: No JVD Cardiac: Irregularly irregular. no murmurs, rubs, or gallops.  Respiratory: Clear to auscultation bilaterally. GI: Soft, nontender, non-distended  MS: No edema; No deformity. Neuro:  Nonfocal  Psych: Normal affect   Labs    High Sensitivity Troponin:   Recent Labs  Lab 12/25/20 0305 12/25/20 0630  TROPONINIHS 20* 20*     Chemistry Recent Labs  Lab 12/25/20 0305 12/25/20 0630 12/25/20 1223 12/26/20 0232 12/27/20 0314  NA 138  --   --  135 136  K 3.8  --   --  3.3* 3.4*  CL 110  --   --  105 106  CO2 20*  --   --  20* 21*  GLUCOSE 108*  --   --  112* 88  BUN 22*  --   --  16 24*  CREATININE 1.19*  --   --  1.37* 1.39*  CALCIUM 8.8*  --   --  9.1 8.8*  MG  --  2.1  --   --  2.2  PROT  --   --  7.4  --   --  ALBUMIN  --   --  3.6  --   --   AST  --   --  28  --   --   ALT  --   --  32  --   --   ALKPHOS  --   --  87  --   --   BILITOT  --   --  1.1  --   --   GFRNONAA 53*  --   --  45* 44*  ANIONGAP 8  --   --  10 9    Lipids  Recent Labs  Lab 12/26/20 0232  CHOL 152  TRIG 51  HDL 48  LDLCALC 94  CHOLHDL 3.2    Hematology Recent Labs  Lab 12/25/20 0305 12/27/20 0314  WBC 7.3 5.4  RBC 4.41 4.52  HGB 11.9* 12.0  HCT 37.0 37.7  MCV 83.9 83.4  MCH 27.0 26.5  MCHC 32.2 31.8  RDW 15.6* 15.3  PLT 166 167   Thyroid  Recent Labs  Lab 12/25/20 0630  TSH 2.038    BNP Recent Labs  Lab 12/25/20 0305  BNP 464.0*    DDimer No results for input(s): DDIMER in the last 168 hours.   Radiology    CT Angio Chest Pulmonary Embolism (PE) W or WO Contrast  Result Date: 12/25/2020 CLINICAL DATA:  PE suspected. High probability. Chest pain and shortness of breath. Lower extremity swelling. EXAM: CT ANGIOGRAPHY CHEST WITH CONTRAST TECHNIQUE: Multidetector CT imaging of the chest was performed using the standard protocol during bolus administration of intravenous contrast. Multiplanar CT image reconstructions and MIPs  were obtained to evaluate the vascular anatomy. CONTRAST:  57mL OMNIPAQUE IOHEXOL 350 MG/ML SOLN COMPARISON:  03/08/2018 FINDINGS: Cardiovascular: Cardiac enlargement. Passive venous congestion with reflux of contrast into the IVC and hepatic veins noted. No evidence of acute pulmonary embolus. Aortic atherosclerosis.  Coronary artery calcifications. Mediastinum/Nodes: No enlarged mediastinal, hilar, or axillary lymph nodes. Thyroid gland, trachea, and esophagus demonstrate no significant findings. Lungs/Pleura: No pleural effusion. Bilateral multifocal patchy airspace densities are identified. Most severe within the lingula, image 76/6. There also scattered areas of subsegmental atelectasis. Mild paraseptal and centrilobular emphysema. Upper Abdomen: No acute abnormality. Musculoskeletal: No chest wall abnormality. No acute or significant osseous findings. Review of the MIP images confirms the above findings. IMPRESSION: 1. No evidence for acute pulmonary embolus. 2. Bilateral multifocal patchy airspace densities are identified compatible with multifocal pneumonia. Followup imaging is recommended in 3-4 weeks following trial of antibiotic therapy to ensure resolution and exclude underlying malignancy. 3. Cardiac enlargement with passive venous congestion suggesting right heart failure. 4. Aortic Atherosclerosis (ICD10-I70.0) and Emphysema (ICD10-J43.9). Coronary artery calcifications Electronically Signed   By: Kerby Moors M.D.   On: 12/25/2020 13:57   DG CHEST PORT 1 VIEW  Result Date: 12/26/2020 CLINICAL DATA:  Shortness of breath and fever EXAM: PORTABLE CHEST 1 VIEW COMPARISON:  12/25/2020 FINDINGS: Cardiomegaly as seen previously. Tortuous aorta. Bilateral patchy pulmonary infiltrates persists, with some worsening particularly in the left mid lung. No dense consolidation or lobar collapse. No effusion. No acute bone finding. IMPRESSION: Cardiomegaly. Aortic atherosclerosis. Patchy bilateral pneumonia,  left worse than right, with some radiographic worsening in the left mid lung. Electronically Signed   By: Nelson Chimes M.D.   On: 12/26/2020 13:19   DG Swallowing Func-Speech Pathology  Result Date: 12/26/2020 Table formatting from the original result was not included. Images from the original result were not included. Objective Swallowing Evaluation: Type of  Study: Bedside Swallow Evaluation  Patient Details Name: Keaisha Sublette MRN: [id number] Date of Birth: [birth year] Today's Date: 12/26/2020 Time: SLP Start Time (ACUTE ONLY): 0830 -SLP Stop Time (ACUTE ONLY): 0847 SLP Time Calculation (min) (ACUTE ONLY): 17 min Past Medical History: Past Medical History: Diagnosis Date  Anemia of other chronic disease 11/13/2013  Anginal pain ([place])   none in past year  Anxiety   Asthma   patient denies  Candidiasis 0/04/4095  Chronic diastolic CHF (congestive heart failure) ([name])   Constipation   Coronary artery disease   Lexiscan myoview (10/12) with significant ST depression upon Lexiscan injection but no ischemia or infarction on perfusion images. Left heart cath (10/12): 30% mid LAD, 30% ostial D1, 50% ostial D2.    Cough 11/13/2013  Depression   Headache 02/22/2014  Hx of cardiovascular stress test   Lexiscan Myoview (2/16): Breast attenuation artifact, no ischemia, EF 64%; low risk  Hyperlipemia   Hypertension   Iron deficiency   hx of  Leukocytoclastic vasculitis ([name])   Rash across lower body, occurred in 9/11, diagnosed by skin biopsy. ANA positive. Thought to be secondary to cocaine use.  Leukopenia 03/21/2013  Lymphadenopathy 03/21/2013  Lymphadenopathy 01/30/2014  Mental disorder   Pancytopenia ([place])   Pancytopenia, acquired ([place]) 07/16/2015  Polysubstance abuse ([place])   Prior cocaine  Pulmonary HTN ([place])   Echo (9/11) with EF 65%, mild LVH, mild AI, mild MR, moderate to possibly severe TR with PA systolic pressure 52 mmHg. Echo (10/12): severe LV hypertrophy, EF 60-65%, mild MR, mild AI, moderate to severe tricuspid  regurgitation, PA systolic pressure 38 mmHg.  Echo 4/14: moderate LVH, EF 65%, normal wall motion, diastolic dysfunction, mild AI, mild MR  Shortness of breath   a. PFTs 5/14:  FEV1/FVC 99% predicted; FVC 60% predicted; DLCO mildly reduced, mild restriction, air trapping.;  b.  seen by pulmo (Dr. [name]) 5/14: mainly upper airway symptoms - ACE d/c'd (sx's better)  Stroke [facility]) 2010ish  Syphilis 04/25/2013  Tobacco abuse   Tricuspid regurgitation   Unspecified deficiency anemia 03/29/2013 Past Surgical History: Past Surgical History: Procedure Laterality Date  ANKLE SURGERY    right  BONE MARROW ASPIRATION    CARDIAC CATHETERIZATION    CARDIAC CATHETERIZATION N/A 04/23/2015  Procedure: Right Heart Cath;  Surgeon: [name], MD;  Location: [place] [facility];  Service: Cardiovascular;  Laterality: N/A;  CARDIOVERSION N/A 03/11/2018  Procedure: CARDIOVERSION;  Surgeon: [name], MD;  Location: [facility] ENDOSCOPY;  Service: Cardiovascular;  Laterality: N/A;  I & D EXTREMITY  08/09/2011  Procedure: IRRIGATION AND DEBRIDEMENT EXTREMITY;  Surgeon: [name], MD;  Location: [place];  Service: General;  Laterality: Left;  i & D Left axilla abscess  LYMPH NODE BIOPSY N/A 04/05/2013  Procedure: EXCISIONAL BIOPSY RIGHT SUBMANDIBULAR NODE, NASAL ENDOSCOPIC WITH BIOPSY NASAL PHARYNX;  Surgeon: [name], MD;  Location: [place];  Service: ENT;  Laterality: N/A;  MULTIPLE EXTRACTIONS WITH ALVEOLOPLASTY N/A 03/03/2019  Procedure: MULTIPLE EXTRACTION WITH ALVEOLOPLASTY;  Surgeon: [name], DDS;  Location: [place];  Service: Oral Surgery;  Laterality: N/A;  TEE WITHOUT CARDIOVERSION N/A 03/11/2018  Procedure: TRANSESOPHAGEAL ECHOCARDIOGRAM (TEE);  Surgeon: [name], MD;  Location: [facility] ENDOSCOPY;  Service: Cardiovascular;  Laterality: N/A; HPI: Pt is a 55 y.o. female who presented with complaints of shortness of breath that had been progressively worsening two weeks prior to admission. CXR 10/5: Mild patchy infiltrate in  the left mid lung. Pt reported to referring MD having  intermittent difficulty swallowing and getting choked/coughing when eating. PMH: hypertension, hyperlipidemia, paroxysmal atrial fibrillation/atrial flutter on Xarelto, HFpEF, MR, CVA, CAD, COPD, cocaine abuse, and tobacco abuse. CT scans from 2020 did note concern for pharyngitis, tonsillitis, and supraglottitis with associated masslike thickening and edema of the hypopharynx at the level of the piriformis which could reflect acute inflammation versus underlying mass. BSE 12/18/18: mild oral dysphagia with prolonged oral phase with regular texture solids; pt discharged from SLP services on 9/28 on dysphagia 3/thin. Outpatient laryngoscopy 11/23/24 wih Dr. Erik Obey: "Nasopharynx is clear. Oropharynx is clear. Hypopharynx/larynx shows thickening of the epiglottis, thickening of the right arytenoid and somewhat narrow supraglottis. Vocal cords are mobile and without lesions. Her larynx is abnormal although the pattern is difficult to identify. I wonder if this could be related to levamisole toxicity from her cocaine use."  Assessment / Plan / Recommendation CHL IP CLINICAL IMPRESSIONS 12/26/2020 Clinical Impression Pt presents with oropharyngeal dysphagia characterized by reduced tongue base retraction, impaired bolus cohesion, reduced laryngeal elevation, reduced anterior laryngeal movement, and a pharyngeal delay. The swallow was often triggered with the head of the bolus at the level of the pyriform sinuses. Penetration (PAS 3,5) was noted with thin liquids via cup and straw. Prompted coughing was effective in expelling the penetrate and laryngeal invasion was eliminated with cued reduction in bolus size. Pharyngeal clearance was [facility] and no esophageal stasis noted. However, pt demonstrated an effortful swallow with nectar thick liquids and with solids. Pt reported that she needed to do this "to get it down". Arytenoid(s) appear(s) enlarged and R arytenoid thickening  was noted on laryngoscopy on 12/23/18. Anterior pharyngeal wall thickening/convex posterior protrusion (See image below) was noted at the level of C3-C5 which aligns with the "masslike thickening and edema of the hypopharynx at the level of the pyriform sinus" noted on the CT soft tissue on 12/20/18. Considering pt's history, and her reports of worsening dysphagia, SLP is in agreement with the repeat CT soft tissue ordered by Dr. Tamala Julian on 12/25/20, and recommends consideration of otolaryngology consult. Pt's current diet of dysphagia 3 solids and thin liquids will be continued with observance of swallowing precautions, and SLP will continue to follow pt. SLP Visit Diagnosis Dysphagia, oropharyngeal phase (R13.12) Attention and concentration deficit following -- Frontal lobe and executive function deficit following -- Impact on safety and function Mild aspiration risk    CHL IP TREATMENT RECOMMENDATION 12/26/2020 Treatment Recommendations Therapy as outlined in treatment plan below   Prognosis 12/26/2020 Prognosis for Safe Diet Advancement Fair Barriers to Reach Goals Time post onset Barriers/Prognosis Comment -- CHL IP DIET RECOMMENDATION 12/26/2020 SLP Diet Recommendations Dysphagia 3 (Mech soft) solids;Thin liquid Liquid Administration via Cup;No straw Medication Administration Whole meds with puree Compensations Slow rate;Small sips/bites Postural Changes Seated upright at 90 degrees   No flowsheet data found.  CHL IP FOLLOW UP RECOMMENDATIONS 12/26/2020 Follow up Recommendations None   CHL IP FREQUENCY AND DURATION 12/26/2020 Speech Therapy Frequency (ACUTE ONLY) min 2x/week Treatment Duration 2 weeks      CHL IP ORAL PHASE 12/26/2020 Oral Phase Impaired Oral - Pudding Teaspoon -- Oral - Pudding Cup -- Oral - Honey Teaspoon -- Oral - Honey Cup -- Oral - Nectar Teaspoon -- Oral - Nectar Cup -- Oral - Nectar Straw Decreased bolus cohesion;Premature spillage Oral - Thin Teaspoon -- Oral - Thin Cup Decreased bolus  cohesion;Premature spillage Oral - Thin Straw Decreased bolus cohesion;Premature spillage Oral - Puree WFL Oral - Mech Soft WFL Oral - Regular --  Oral - Multi-Consistency -- Oral - Pill Decreased bolus cohesion;Premature spillage Oral Phase - Comment --  CHL IP PHARYNGEAL PHASE 12/26/2020 Pharyngeal Phase Impaired Pharyngeal- Pudding Teaspoon -- Pharyngeal -- Pharyngeal- Pudding Cup -- Pharyngeal -- Pharyngeal- Honey Teaspoon -- Pharyngeal -- Pharyngeal- Honey Cup -- Pharyngeal -- Pharyngeal- Nectar Teaspoon -- Pharyngeal -- Pharyngeal- Nectar Cup -- Pharyngeal -- Pharyngeal- Nectar Straw Delayed swallow initiation-vallecula Pharyngeal -- Pharyngeal- Thin Teaspoon -- Pharyngeal -- Pharyngeal- Thin Cup Delayed swallow initiation-pyriform sinuses;Penetration/Aspiration during swallow Pharyngeal Material enters airway, remains ABOVE vocal cords and not ejected out Pharyngeal- Thin Straw Delayed swallow initiation-pyriform sinuses;Penetration/Aspiration during swallow Pharyngeal Material enters airway, CONTACTS cords and not ejected out Pharyngeal- Puree WFL Pharyngeal -- Pharyngeal- Mechanical Soft WFL Pharyngeal -- Pharyngeal- Regular -- Pharyngeal -- Pharyngeal- Multi-consistency -- Pharyngeal -- Pharyngeal- Pill Delayed swallow initiation-pyriform sinuses;Penetration/Aspiration during swallow Pharyngeal Material enters airway, remains ABOVE vocal cords and not ejected out Pharyngeal Comment --  Shanika I. Hardin Negus, [place], [place] Office number [phone] Pager [phone] Horton Marshall 12/26/2020, 10:22 AM              ECHOCARDIOGRAM COMPLETE  Result Date: 12/25/2020    ECHOCARDIOGRAM REPORT   Patient Name:   ALEATHIA PURDY Date of Exam: 12/25/2020 Medical Rec #:  [id number]    Height:       66.0 in Accession #:    [id number]   Weight:       162.0 lb Date of Birth:  [birth year]    BSA:          1.828 m Patient Age:    55 years     BP:           158/117 mmHg Patient Gender: F             HR:           91 bpm. Exam Location:  Inpatient Procedure: 2D Echo, Cardiac Doppler and Color Doppler Indications:    Atrial Fibrillation I48.91  History:        Patient has prior history of Echocardiogram examinations, most                 recent 01/25/2019. CHF, CAD, COPD and Stroke, Arrythmias:Atrial                 Flutter; Risk Factors:Hypertension and Current Smoker. Mitral                 regurgitation. Mild pulmonary hypertension. Atrial flutter s/p                 ablation. Obstructive sleep apnea (CPAP). Polysubstance abuse.  Sonographer:    Alvino Chapel RCS Referring Phys: [id number] RONDELL A SMITH IMPRESSIONS  1. Left ventricular ejection fraction, by estimation, is 60 to 65%. The left ventricle has normal function. The left ventricle has no regional wall motion abnormalities. There is mild left ventricular hypertrophy. Left ventricular diastolic function could not be evaluated.  2. Right ventricular systolic function is normal. The right ventricular size is normal. There is normal pulmonary artery systolic pressure. The estimated right ventricular systolic pressure is 94.1 mmHg.  3. Left atrial size was severely dilated.  4. Right atrial size was severely dilated.  5. The mitral valve is abnormal. Moderate mitral valve regurgitation. Moderate mitral annular calcification.  6. The tricuspid valve is abnormal. Tricuspid valve regurgitation is moderate.  7. The aortic valve is tricuspid. Aortic valve regurgitation is moderate. Mild aortic valve sclerosis is present, with no evidence  of aortic valve stenosis. Aortic regurgitation PHT measures 498 msec.  8. The inferior vena cava is normal in size with <50% respiratory variability, suggesting right atrial pressure of 8 mmHg. Comparison(s): Changes from prior study are noted. 01/25/2019: LVEF 60-65%, mild MR, mild to moderate AI, mild TR. FINDINGS  Left Ventricle: Left ventricular ejection fraction, by estimation, is 60 to 65%. The left ventricle has normal  function. The left ventricle has no regional wall motion abnormalities. The left ventricular internal cavity size was normal in size. There is  mild left ventricular hypertrophy. Left ventricular diastolic function could not be evaluated due to atrial fibrillation. Left ventricular diastolic function could not be evaluated. Right Ventricle: The right ventricular size is normal. No increase in right ventricular wall thickness. Right ventricular systolic function is normal. There is normal pulmonary artery systolic pressure. The tricuspid regurgitant velocity is 2.51 m/s, and  with an assumed right atrial pressure of 8 mmHg, the estimated right ventricular systolic pressure is 26.7 mmHg. Left Atrium: Left atrial size was severely dilated. Right Atrium: Right atrial size was severely dilated. Pericardium: There is no evidence of pericardial effusion. Mitral Valve: The mitral valve is abnormal. There is moderate calcification of the anterior and posterior mitral valve leaflet(s). Moderate mitral annular calcification. Moderate mitral valve regurgitation. Tricuspid Valve: The tricuspid valve is abnormal. Tricuspid valve regurgitation is moderate. Aortic Valve: The aortic valve is tricuspid. Aortic valve regurgitation is moderate. Aortic regurgitation PHT measures 498 msec. Mild aortic valve sclerosis is present, with no evidence of aortic valve stenosis. Pulmonic Valve: The pulmonic valve was grossly normal. Pulmonic valve regurgitation is trivial. Aorta: The aortic root and ascending aorta are structurally normal, with no evidence of dilitation. Venous: The inferior vena cava is normal in size with less than 50% respiratory variability, suggesting right atrial pressure of 8 mmHg. IAS/Shunts: No atrial level shunt detected by color flow Doppler.  LEFT VENTRICLE PLAX 2D LVIDd:         4.90 cm LVIDs:         3.20 cm LV PW:         1.30 cm LV IVS:        1.40 cm LVOT diam:     1.90 cm LV SV:         58 LV SV Index:   31  LVOT Area:     2.84 cm  LEFT ATRIUM              Index       RIGHT ATRIUM           Index LA diam:        4.10 cm  2.24 cm/m  RA Area:     31.10 cm LA Vol (A2C):   170.0 ml 92.97 ml/m RA Volume:   115.00 ml 62.89 ml/m LA Vol (A4C):   105.0 ml 57.42 ml/m LA Biplane Vol: 136.0 ml 74.38 ml/m  AORTIC VALVE LVOT Vmax:   126.00 cm/s LVOT Vmean:  86.700 cm/s LVOT VTI:    0.203 m AI PHT:      498 msec  AORTA Ao Root diam: 3.60 cm MITRAL VALVE                TRICUSPID VALVE MV Area (PHT): 3.48 cm     TR Peak grad:   25.2 mmHg MV Decel Time: 218 msec     TR Vmax:        251.00 cm/s MR Peak grad: 134.6 mmHg MR Mean  grad: 87.0 mmHg     SHUNTS MR Vmax:      580.00 cm/s   Systemic VTI:  0.20 m MR Vmean:     432.0 cm/s    Systemic Diam: 1.90 cm MV E velocity: 111.00 cm/s Lyman Bishop MD Electronically signed by Lyman Bishop MD Signature Date/Time: 12/25/2020/5:11:22 PM    Final     Cardiac Studies   Echo 12/25/2020:  1. Left ventricular ejection fraction, by estimation, is 60 to 65%. The  left ventricle has normal function. The left ventricle has no regional  wall motion abnormalities. There is mild left ventricular hypertrophy.  Left ventricular diastolic function  could not be evaluated.   2. Right ventricular systolic function is normal. The right ventricular  size is normal. There is normal pulmonary artery systolic pressure. The  estimated right ventricular systolic pressure is 49.1 mmHg.   3. Left atrial size was severely dilated.   4. Right atrial size was severely dilated.   5. The mitral valve is abnormal. Moderate mitral valve regurgitation.  Moderate mitral annular calcification.   6. The tricuspid valve is abnormal. Tricuspid valve regurgitation is  moderate.   7. The aortic valve is tricuspid. Aortic valve regurgitation is moderate.  Mild aortic valve sclerosis is present, with no evidence of aortic valve  stenosis. Aortic regurgitation PHT measures 498 msec.   8. The inferior vena cava  is normal in size with <50% respiratory  variability, suggesting right atrial pressure of 8 mmHg.   Patient Profile     55 y.o. female with moderate to severe med regurgitation, hypertension, hyperlipidemia, chronic diastolic heart failure, nonobstructive CAD, atrial fibrillation/flutter status post ablation, and daily cocaine use admitted with atrial flutter with rapid ventricular response and community-acquired pneumonia.  Assessment & Plan    #Atrial flutter: Status post ablation in 2018.  She presented with recurrent atrial flutter.  Rates are now well-controlled.  He was switched from IV to oral diltiazem.  Home dose of 180 mg was increased to 300 mg.  Continue Eliquis.  She needs TEE given concern for missed doses of Eliquis at home.  Plan for TEE/cardioversion today at 2 PM.  Continue to advise cocaine cessation.    #Acute on chronic diastolic heart failure: #CAP/aspiration pneumonia: # Hypoxia: She is admitted with heart failure symptoms and has been diuresed.  Chest x-ray was also concerning for community-acquired pneumonia.  She is had a cough that has been productive of phlegm but no fevers or chills until last night.  She was started on empiric antibiotics.  Renal function is slightly above baseline.  Ibuprofen was stopped yesterday.  We were planning to start oral today, but will wait until tomorrow.  She appears to be euvolemic on exam.  # Essential hypertension: Her blood pressure regimen is somewhat odd.  She is only on 10 mg of lisinopril, clonidine, and diltiazem.  Diltiazem was increased this admission after blood pressure still above goal.  Given her issues with compliance, clonidine is probably not the best medication for her.  She is also persistently hypokalemic.  We will wean the clonidine to 0.05 mg twice daily with plans to stop it in the next day or 2.  Start spironolactone 25 mg daily.  If she discharges from the hospital today or tomorrow, she will need a BMP in 1  week.      For questions or updates, please contact [place] Please consult [email or web address] for contact info under  Signed, [name], MD  12/27/2020, 8:43 AM

## 2020-12-27 NOTE — Progress Notes (Signed)
Have arranged f/u per Dr. Blenda Mounts request, appt placed on AVS - spoke with patient via phone to ensure she is aware this is at [facility] location.

## 2020-12-27 NOTE — Progress Notes (Signed)
Mobility Specialist Progress Note    12/27/20 0857  Mobility  Activity Contraindicated/medical hold   RN stated pt experiencing afib and going for tee.   Hildred Alamin Mobility Specialist  Mobility Specialist Phone: [phone]

## 2020-12-27 NOTE — Anesthesia Postprocedure Evaluation (Signed)
Anesthesia Post Note  Patient: Morgan Bean  Procedure(s) Performed: TRANSESOPHAGEAL ECHOCARDIOGRAM (TEE) (Groin)     Patient location during evaluation: Endoscopy Anesthesia Type: MAC Level of consciousness: awake and alert Pain management: pain level controlled Vital Signs Assessment: post-procedure vital signs reviewed and stable Respiratory status: spontaneous breathing, nonlabored ventilation, respiratory function stable and patient connected to nasal cannula oxygen Cardiovascular status: stable and blood pressure returned to baseline Postop Assessment: no apparent nausea or vomiting Anesthetic complications: no   No notable events documented.  Last Vitals:  Vitals:   12/27/20 1524 12/27/20 1551  BP: (!) 127/92 (!) 125/91  Pulse: 77 90  Resp: (!) 27 (!) 22  Temp:  36.9 C  SpO2: 99% 93%    Last Pain:  Vitals:   12/27/20 1551  TempSrc: Oral  PainSc:                  March Rummage Lavender Stanke

## 2020-12-27 NOTE — Interval H&P Note (Signed)
History and Physical Interval Note:  12/27/2020 8:29 AM  Morgan Bean  has presented today for surgery, with the diagnosis of AFIB.  The various methods of treatment have been discussed with the patient and family. After consideration of risks, benefits and other options for treatment, the patient has consented to  Procedure(s): TRANSESOPHAGEAL ECHOCARDIOGRAM (TEE) (N/A) CARDIOVERSION (N/A) as a surgical intervention.  The patient's history has been reviewed, patient examined, no change in status, stable for surgery.  I have reviewed the patient's chart and labs.  Questions were answered to the patient's satisfaction.     Elexa Kivi

## 2020-12-27 NOTE — CV Procedure (Signed)
    TRANSESOPHAGEAL ECHOCARDIOGRAM   NAME:  Morgan Bean    MRN: [id number] DOB:  12-18-1965    ADMIT DATE: 12/25/2020  INDICATIONS: Atrial fibrillation  PROCEDURE:   Informed consent was obtained prior to the procedure. The risks, benefits and alternatives for the procedure were discussed and the patient comprehended these risks.  Risks include, but are not limited to, cough, sore throat, vomiting, nausea, somnolence, esophageal and stomach trauma or perforation, bleeding, low blood pressure, aspiration, pneumonia, infection, trauma to the teeth and death.    Procedural time out performed. The oropharynx was anesthetized with 10 cc viscous lidocaine.  Anesthesia was administered by the anesthesiology team.  The patient was administered a total of Propofol 297 mg, to achieve and maintain moderate to deep conscious sedation.  The patient's heart rate, blood pressure, and oxygen saturation are monitored continuously during the procedure.  The transesophageal probe was inserted in the esophagus and stomach without difficulty and multiple views were obtained.   COMPLICATIONS:    There were no immediate complications.  KEY FINDINGS:  Normal LV EF. Clot noted in the left atrial appendage, Normal AV, Normal MV, Normal TV.  Full report to follow. Further management per primary team.   Rudean Haskell, [place]  3:25 PM

## 2020-12-27 NOTE — Transfer of Care (Signed)
Immediate Anesthesia Transfer of Care Note  Patient: Arlando Leisinger  Procedure(s) Performed: TRANSESOPHAGEAL ECHOCARDIOGRAM (TEE) (Groin)  Patient Location: Endoscopy Unit  Anesthesia Type:MAC  Level of Consciousness: awake, alert , drowsy and patient cooperative  Airway & Oxygen Therapy: Patient Spontanous Breathing  Post-op Assessment: Report given to RN, Post -op Vital signs reviewed and stable and Patient moving all extremities  Post vital signs: Reviewed and stable  Last Vitals:  Vitals Value Taken Time  BP    Temp    Pulse 100 12/27/20 1506  Resp 30 12/27/20 1506  SpO2 99 % 12/27/20 1506  Vitals shown include unvalidated device data.  Last Pain:  Vitals:   12/27/20 1321  TempSrc: Temporal  PainSc: 0-No pain         Complications: No notable events documented.

## 2020-12-27 NOTE — Anesthesia Procedure Notes (Signed)
Procedure Name: MAC Date/Time: 12/27/2020 2:38 PM Performed by: Jenne Campus, CRNA Pre-anesthesia Checklist: Patient identified, Emergency Drugs available, Suction available and Patient being monitored Patient Re-evaluated:Patient Re-evaluated prior to induction Oxygen Delivery Method: Nasal cannula Preoxygenation: Pre-oxygenation with 100% oxygen Induction Type: IV induction Placement Confirmation: positive ETCO2 and CO2 detector Dental Injury: Teeth and Oropharynx as per pre-operative assessment

## 2020-12-28 ENCOUNTER — Inpatient Hospital Stay (HOSPITAL_COMMUNITY): Payer: Medicaid Other

## 2020-12-28 DIAGNOSIS — I4892 Unspecified atrial flutter: Secondary | ICD-10-CM | POA: Diagnosis not present

## 2020-12-28 LAB — CBC
HCT: 34.9 % — ABNORMAL LOW (ref 36.0–46.0)
Hemoglobin: 11.2 g/dL — ABNORMAL LOW (ref 12.0–15.0)
MCH: 27 pg (ref 26.0–34.0)
MCHC: 32.1 g/dL (ref 30.0–36.0)
MCV: 84.1 fL (ref 80.0–100.0)
Platelets: 183 10*3/uL (ref 150–400)
RBC: 4.15 MIL/uL (ref 3.87–5.11)
RDW: 15.1 % (ref 11.5–15.5)
WBC: 5.1 10*3/uL (ref 4.0–10.5)
nRBC: 0 % (ref 0.0–0.2)

## 2020-12-28 LAB — BASIC METABOLIC PANEL
Anion gap: 5 (ref 5–15)
BUN: 19 mg/dL (ref 6–20)
CO2: 22 mmol/L (ref 22–32)
Calcium: 8.6 mg/dL — ABNORMAL LOW (ref 8.9–10.3)
Chloride: 109 mmol/L (ref 98–111)
Creatinine, Ser: 1.34 mg/dL — ABNORMAL HIGH (ref 0.44–1.00)
GFR, Estimated: 46 mL/min — ABNORMAL LOW (ref >60)
Glucose, Bld: 96 mg/dL (ref 70–99)
Potassium: 4.5 mmol/L (ref 3.5–5.1)
Sodium: 136 mmol/L (ref 135–145)

## 2020-12-28 MED ORDER — AMOXICILLIN-POT CLAVULANATE 875-125 MG PO TABS: 1.0000 | ORAL_TABLET | Freq: Two times a day (BID) | ORAL | 0 refills | Status: AC

## 2020-12-28 MED ORDER — SPIRONOLACTONE 25 MG PO TABS: 25.0000 mg | ORAL_TABLET | Freq: Every day | ORAL | 0 refills | Status: DC

## 2020-12-28 MED ORDER — DILTIAZEM HCL ER COATED BEADS 300 MG PO CP24: 300.0000 mg | ORAL_CAPSULE | Freq: Every day | ORAL | 0 refills | Status: DC

## 2020-12-28 MED ORDER — IOHEXOL 350 MG/ML SOLN
100.0000 mL | Freq: Once | INTRAVENOUS | Status: AC | PRN
  Administered 2020-12-28: 100 mL via INTRAVENOUS

## 2020-12-28 NOTE — Discharge Summary (Signed)
Physician Discharge Summary  Morgan Bean JKD:[id number] DOB: 1965/11/28 DOA: 12/25/2020  PCP: [name], MD  Admit date: 12/25/2020 Discharge date: 12/28/2020  Admitted From: home Disposition:  home  Recommendations for Outpatient Follow-up:  Follow up with PCP and cardiology  in 1-2 weeks Please obtain BMP/CBC in one week Follow-up with [facility] ENT call the office if you do not hear from them  Home Health:no  Equipment/Devices: none  Discharge Condition: Stable Code Status:   Code Status: Full Code Diet recommendation:  Diet Order             DIET DYS 3 Room service appropriate? Yes; Fluid consistency: Thin  Diet effective now                    Brief/Interim Summary: 55 year old female with HTN, HLD PAF/flutter on Xarelto, chronic diastolic CHF, MR, history of CVA, CAD, COPD, cocaine abuse, tobacco abuse presented with shortness of breath, progressively worse x2 weeks.  Also having difficulty swallowing and hoarseness of voice was referred to ENT but has not made an appointment yet In the ED UDS positive for cocaine chest x-ray with mild patchy infiltrate of the left lung EKG with A. fib with RVR, underwent CT angio chest no PE but multifocal pneumonia soon, placed on IV antibiotics cardiology consulted patient was admitted.  Patient was treated for pneumonia aspiration with dysphagia seen by speech.  Seen by cardiology A. fib with RVR managed with TEE cardioversion at this time remains in sinus rhythm.  Cardiac meds adjusted.  She is medically stable  for discharge home with follow-up with BMP next week and follow-up with cardiology.  Discharge Diagnoses:    Atrial flutter with rapid ventricular response: Previous ablation in 2018, presenting with recurrent a flutter rate now controlled on oral Cardizem off IV, home dose increased from 180 to 300 mg, continue Xarelto, s/p TEE cardioversion  10/7. On  amiodarone,.  Cleared by cardiology for discharge home today.   Advised cocaine cessation.  Follow-up with cardiology next week with a BMP.   Acute on chronic diastolic CHF: Improved, euvolemic continue on oral Lasix Aldactone.  Some weight gain, advise close follow-up Filed Weights   12/26/20 0600 12/27/20 0400 12/28/20 0503  Weight: 71.8 kg 72.3 kg 75.6 kg   Community-acquired pneumonia/aspiration pneumonia: CT NG 10/5.  Bilateral multifocal patchy densities compatible with multifocal pneumonia follow-up imaging 3-4 wks suggested to exclude underlying malignancy.  Chest x-ray concerning for community-acquired pneumonia also having cough with sputum but no fevers.  WBC count and procalcitonin reassuring treated with Unasyn will discharge on Augmentin Recent Labs  Lab 12/25/20 0305 12/25/20 1223 12/27/20 0314 12/28/20 0254  WBC 7.3  --  5.4 5.1  PROCALCITON  --  <0.10  --   --     Acute hypoxic respiratory failure in the setting of #2 and #3.  On room air this morning.  Resolved.  Essential hypertension Hypertension urgency: Blood pressure has stabilized. On lisinopril clonidine and Cardizem at home.Started on Aldactone, continue lisinopril, increased dose of Cardizem, discontinued clonidine per cardiology  Hypokalemia repleted  HLD: continue Lipitor.  Dysphagia/hoarseness of voice with history of pharyngeal mass- pt endorses intermittetn dysphagia and hoarseness for few years, saw ENT in 2020. MBS here showed "mass-like protrusion on the anterior laryngeal wall and at least one arytenoid appears enlarged-things have been noted since 2020 and some edema was suspected" , I paged ENT office and left patient's details for call back from oncall ENT MD. To discuss  further- inapteitn work up vs OP work up.  Discussed with ENT on-call 10/7- CT soft tissue neck with contrast  done residual as below with improvement.  Patient has a follow-up with ENT as outpatient "1. Improving Tonsillitis and cervical lymphadenopathy since last month. No neck abscess  identified. Resolved parapharyngeal space inflammation, but there is unresolved edema in the hypopharynx and supraglottic larynx - but sparing the epiglottis. 2. New left middle ear and mastoid effusion since last month, probably due to left-side eustachian tube dysfunction from persistent adenoid hypertrophy. No complicating features identified. 3. All dentition has been extracted since last month. 4. Mild new bilateral paranasal sinus inflammation. 5. Aortic Atherosclerosis:   Cocaine/tobacco abuse cessation advised  Stage IIIa CKD: Creatinine at baseline.  Follow-up BMP in a week Recent Labs  Lab 12/25/20 0305 12/26/20 0232 12/27/20 0314 12/28/20 0254  BUN 22* 16 24* 19  CREATININE 1.19* 1.37* 1.39* 1.34*    Anemia likely from chronic kidney disease.  Monitor hemoglobin-CBC in a week COPD not in exacerbation Hx of CVA- cont xarelto, statins Consults: Cardiology  Subjective: Alert awake oriented, resting comfortable some runny nose this morning.  Discharge Exam: Vitals:   12/28/20 0746 12/28/20 1100  BP: (!) 139/107 130/80  Pulse: 73 74  Resp: 20 18  Temp: 98.6 F (37 C) 98 F (36.7 C)  SpO2: 100% 98%   General: Pt is alert, awake, not in acute distress Cardiovascular: RRR, S1/S2 +, no rubs, no gallops Respiratory: CTA bilaterally, no wheezing, no rhonchi Abdominal: Soft, NT, ND, bowel sounds + Extremities: no edema, no cyanosis  Discharge Instructions  Discharge Instructions     Amb referral to [facility] Clinic   Complete by: As directed    Discharge instructions   Complete by: As directed    Check BMP in 1 week.  Follow-up with Dr. Oval Linsey at cardiology clinic.  Follow-up with ENT clinic  Please call call MD or return to ER for similar or worsening recurring problem that brought you to hospital or if any fever,nausea/vomiting,abdominal pain, uncontrolled pain, chest pain,  shortness of breath or any other alarming symptoms.  Please follow-up your doctor as  instructed in a week time and call the office for appointment.  Please avoid alcohol, smoking, or any other illicit substance and maintain healthy habits including taking your regular medications as prescribed.  You were cared for by a hospitalist during your hospital stay. If you have any questions about your discharge medications or the care you received while you were in the hospital after you are discharged, you can call the unit and ask to speak with the hospitalist on call if the hospitalist that took care of you is not available.  Once you are discharged, your primary care physician will handle any further medical issues. Please note that NO REFILLS for any discharge medications will be authorized once you are discharged, as it is imperative that you return to your primary care physician (or establish a relationship with a primary care physician if you do not have one) for your aftercare needs so that they can reassess your need for medications and monitor your lab values   Increase activity slowly   Complete by: As directed       Allergies as of 12/28/2020       Reactions   Ciprofloxacin Itching        Medication List     STOP taking these medications    cloNIDine 0.1 MG tablet Commonly known as: CATAPRES  TAKE these medications    amiodarone 200 MG tablet Commonly known as: PACERONE Take 1 tablet (200 mg total) by mouth daily.   amoxicillin-clavulanate 875-125 MG tablet Commonly known as: Augmentin Take 1 tablet by mouth 2 (two) times daily for 3 days.   ARIPiprazole 5 MG tablet Commonly known as: ABILIFY Take 5 mg by mouth daily.   atorvastatin 40 MG tablet Commonly known as: LIPITOR Take 1 tablet (40 mg total) by mouth at bedtime.   buPROPion 150 MG 12 hr tablet Commonly known as: WELLBUTRIN SR Take 1 tablet (150 mg total) by mouth 2 (two) times daily.   citalopram 40 MG tablet Commonly known as: CELEXA Take 1 tablet (40 mg total) by mouth  daily.   diltiazem 300 MG 24 hr capsule Commonly known as: CARDIZEM CD Take 1 capsule (300 mg total) by mouth daily. Start taking on: December 29, 2020 What changed:  medication strength how much to take   ferrous sulfate 325 (65 FE) MG EC tablet Take 1 tablet (325 mg total) by mouth 3 (three) times daily with meals.   furosemide 40 MG tablet Commonly known as: LASIX TAKE 1 TABLET (40 MG TOTAL) BY MOUTH 2 (TWO) TIMES DAILY.   hydrOXYzine 50 MG tablet Commonly known as: ATARAX/VISTARIL Take 50 mg by mouth 3 (three) times daily.   lisinopril 10 MG tablet Commonly known as: ZESTRIL Take 1 tablet (10 mg total) by mouth daily.   potassium chloride SA 20 MEQ tablet Commonly known as: KLOR-CON Take 1 tablet (20 mEq total) by mouth daily.   spironolactone 25 MG tablet Commonly known as: ALDACTONE Take 1 tablet (25 mg total) by mouth daily. Start taking on: December 29, 2020   topiramate 25 MG tablet Commonly known as: TOPAMAX Take 2 tablets (50 mg total) by mouth at bedtime.   traZODone 150 MG tablet Commonly known as: DESYREL Take 1 tablet (150 mg total) by mouth at bedtime.   Xarelto 20 MG Tabs tablet Generic drug: rivaroxaban Take 1 tablet (20 mg total) by mouth daily with supper.        Follow-up Information     Loel Dubonnet, NP Follow up.   Specialty: Cardiology Why: Your cardiology follow-up will be at the [place] location off [facility] on Tuesday Jan 14, 2021 8:30 AM (Arrive by 8:15 AM). Urban Gibson is one of our nurse practitioners that works with our cardiology team and Dr. Oval Linsey. Contact information: [place] [address] [place], [place]. Schedule an appointment as soon as possible for a visit.   Why: Please call office to make an appointment for a visit as soon as possible with a physician provider. Contact information: [address] [place]  [address] [phone]                Allergies  Allergen Reactions   Ciprofloxacin Itching    The results of significant diagnostics from this hospitalization (including imaging, microbiology, ancillary and laboratory) are listed below for reference.    Microbiology: Recent Results (from the past 240 hour(s))  Resp Panel by RT-PCR (Flu A&B, Covid) Nasopharyngeal Swab     Status: None   Collection Time: 12/25/20  3:05 AM   Specimen: Nasopharyngeal Swab; Nasopharyngeal(NP) swabs in vial transport medium  Result Value Ref Range Status   SARS Coronavirus 2 by RT PCR NEGATIVE NEGATIVE Final    Comment: (NOTE) SARS-CoV-2 target  nucleic acids are NOT DETECTED.  The SARS-CoV-2 RNA is generally detectable in upper respiratory specimens during the acute phase of infection. The lowest concentration of SARS-CoV-2 viral copies this assay can detect is 138 copies/mL. A negative result does not preclude SARS-Cov-2 infection and should not be used as the sole basis for treatment or other patient management decisions. A negative result may occur with  improper specimen collection/handling, submission of specimen other than nasopharyngeal swab, presence of viral mutation(s) within the areas targeted by this assay, and inadequate number of viral copies(<138 copies/mL). A negative result must be combined with clinical observations, patient history, and epidemiological information. The expected result is Negative.  Fact Sheet for Patients:  EntrepreneurPulse.com.au  Fact Sheet for Healthcare Providers:  IncredibleEmployment.be  This test is no t yet approved or cleared by the [place] FDA and  has been authorized for detection and/or diagnosis of SARS-CoV-2 by FDA under an Emergency Use Authorization (EUA). This EUA will remain  in effect (meaning this test can be used) for the duration of the COVID-19 declaration under Section 564(b)(1) of the Act,  21 U.S.C.section 360bbb-3(b)(1), unless the authorization is terminated  or revoked sooner.       Influenza A by PCR NEGATIVE NEGATIVE Final   Influenza B by PCR NEGATIVE NEGATIVE Final    Comment: (NOTE) The Xpert Xpress SARS-CoV-2/FLU/RSV plus assay is intended as an aid in the diagnosis of influenza from Nasopharyngeal swab specimens and should not be used as a sole basis for treatment. Nasal washings and aspirates are unacceptable for Xpert Xpress SARS-CoV-2/FLU/RSV testing.  Fact Sheet for Patients: EntrepreneurPulse.com.au  Fact Sheet for Healthcare Providers: IncredibleEmployment.be  This test is not yet approved or cleared by the [place] FDA and has been authorized for detection and/or diagnosis of SARS-CoV-2 by FDA under an Emergency Use Authorization (EUA). This EUA will remain in effect (meaning this test can be used) for the duration of the COVID-19 declaration under Section 564(b)(1) of the Act, 21 U.S.C. section 360bbb-3(b)(1), unless the authorization is terminated or revoked.  Performed at [facility], [place] [address] [place], [place] [address]     Procedures/Studies: CT SOFT TISSUE NECK W CONTRAST  Result Date: 12/28/2020 CLINICAL DATA:  55 year old female with hoarseness. Suspected tonsillitis and pharyngitis on CTs last month. Intermittent dysphagia. EXAM: CT NECK WITH CONTRAST TECHNIQUE: Multidetector CT imaging of the neck was performed using the standard protocol following the bolus administration of intravenous contrast. CONTRAST:  123mL OMNIPAQUE IOHEXOL 350 MG/ML SOLN COMPARISON:  12/20/2018 and 12/16/2018 neck CTs. FINDINGS: Pharynx and larynx: Decreased hyperenhancement of the palatine tonsils, slightly decreased in size since 12/20/2018. However, at the same time the adenoids are slightly more enhancing now (series 3, image 29). Left parapharyngeal space inflammation appears largely resolved. Negative  right parapharyngeal space. Retropharyngeal space also relatively normal. Epiglottis is stable and within normal limits. Thickening of the bilateral AE folds has decreased but not resolved, remaining greater on the right. True cords appear normal. Salivary glands: Sublingual space remains within normal limits. Submandibular and parotid spaces are stable. Thyroid: Negative. Lymph nodes: Bilateral cervical nodes show decreased enhancement since 12/16/2018. Lymph node size has mildly decreased overall. The largest nodes are stable. No cystic or necrotic node identified. Vascular: Major vascular structures in the neck and at the skull base remain patent. Right greater than left calcified carotid atherosclerosis. Calcified atherosclerosis at the skull base. Limited intracranial: Mild nonspecific cerebral white matter hypodensity. Visualized orbits: Negative. Mastoids and visualized paranasal  sinuses: Increased bilateral paranasal sinus mucosal thickening, mild and most pronounced in the left frontoethmoidal recess. New subtotal opacification of the left tympanic cavity also, with scattered new left mastoid effusion and mastoid fluid levels. No mastoid coalescence or bone erosion identified. Right middle ear and mastoids remain clear. Skeleton: All dentition has been extracted since last month. No acute osseous abnormality identified. Upper chest: Negative visible lungs aside from mild atelectasis. Calcified aortic atherosclerosis. No superior mediastinal lymphadenopathy. IMPRESSION: 1. Improving Tonsillitis and cervical lymphadenopathy since last month. No neck abscess identified. Resolved parapharyngeal space inflammation, but there is unresolved edema in the hypopharynx and supraglottic larynx - but sparing the epiglottis. 2. New left middle ear and mastoid effusion since last month, probably due to left-side eustachian tube dysfunction from persistent adenoid hypertrophy. No complicating features identified. 3. All  dentition has been extracted since last month. 4. Mild new bilateral paranasal sinus inflammation. 5. Aortic Atherosclerosis (ICD10-I70.0). Electronically Signed   By: Genevie Ann M.D.   On: 12/28/2020 08:36   CT Angio Chest Pulmonary Embolism (PE) W or WO Contrast  Result Date: 12/25/2020 CLINICAL DATA:  PE suspected. High probability. Chest pain and shortness of breath. Lower extremity swelling. EXAM: CT ANGIOGRAPHY CHEST WITH CONTRAST TECHNIQUE: Multidetector CT imaging of the chest was performed using the standard protocol during bolus administration of intravenous contrast. Multiplanar CT image reconstructions and MIPs were obtained to evaluate the vascular anatomy. CONTRAST:  42mL OMNIPAQUE IOHEXOL 350 MG/ML SOLN COMPARISON:  03/08/2018 FINDINGS: Cardiovascular: Cardiac enlargement. Passive venous congestion with reflux of contrast into the IVC and hepatic veins noted. No evidence of acute pulmonary embolus. Aortic atherosclerosis.  Coronary artery calcifications. Mediastinum/Nodes: No enlarged mediastinal, hilar, or axillary lymph nodes. Thyroid gland, trachea, and esophagus demonstrate no significant findings. Lungs/Pleura: No pleural effusion. Bilateral multifocal patchy airspace densities are identified. Most severe within the lingula, image 76/6. There also scattered areas of subsegmental atelectasis. Mild paraseptal and centrilobular emphysema. Upper Abdomen: No acute abnormality. Musculoskeletal: No chest wall abnormality. No acute or significant osseous findings. Review of the MIP images confirms the above findings. IMPRESSION: 1. No evidence for acute pulmonary embolus. 2. Bilateral multifocal patchy airspace densities are identified compatible with multifocal pneumonia. Followup imaging is recommended in 3-4 weeks following trial of antibiotic therapy to ensure resolution and exclude underlying malignancy. 3. Cardiac enlargement with passive venous congestion suggesting right heart failure. 4. Aortic  Atherosclerosis (ICD10-I70.0) and Emphysema (ICD10-J43.9). Coronary artery calcifications Electronically Signed   By: Kerby Moors M.D.   On: 12/25/2020 13:57   DG CHEST PORT 1 VIEW  Result Date: 12/26/2020 CLINICAL DATA:  Shortness of breath and fever EXAM: PORTABLE CHEST 1 VIEW COMPARISON:  12/25/2020 FINDINGS: Cardiomegaly as seen previously. Tortuous aorta. Bilateral patchy pulmonary infiltrates persists, with some worsening particularly in the left mid lung. No dense consolidation or lobar collapse. No effusion. No acute bone finding. IMPRESSION: Cardiomegaly. Aortic atherosclerosis. Patchy bilateral pneumonia, left worse than right, with some radiographic worsening in the left mid lung. Electronically Signed   By: Nelson Chimes M.D.   On: 12/26/2020 13:19   DG Chest Port 1 View  Result Date: 12/25/2020 CLINICAL DATA:  Shortness of breath chest pain for 1 week EXAM: PORTABLE CHEST 1 VIEW COMPARISON:  05/28/2020 FINDINGS: Cardiac shadow remains enlarged. The lungs are well aerated bilaterally. Patchy infiltrate is noted in the left mid lung. No sizable effusion is noted. No bony abnormality is seen. IMPRESSION: Mild patchy infiltrate in the left mid lung. Electronically Signed  By: Inez Catalina M.D.   On: 12/25/2020 03:23   DG Swallowing Func-Speech Pathology  Result Date: 12/26/2020 Table formatting from the original result was not included. Images from the original result were not included. Objective Swallowing Evaluation: Type of Study: Bedside Swallow Evaluation  Patient Details Name: Kionna Brier MRN: [id number] Date of Birth: 1966-01-24 Today's Date: 12/26/2020 Time: SLP Start Time (ACUTE ONLY): 0830 -SLP Stop Time (ACUTE ONLY): 0847 SLP Time Calculation (min) (ACUTE ONLY): 17 min Past Medical History: Past Medical History: Diagnosis Date  Anemia of other chronic disease 11/13/2013  Anginal pain ([place])   none in past year  Anxiety   Asthma   patient denies  Candidiasis 03/31/2991  Chronic diastolic  CHF (congestive heart failure) ([name])   Constipation   Coronary artery disease   Lexiscan myoview (10/12) with significant ST depression upon Lexiscan injection but no ischemia or infarction on perfusion images. Left heart cath (10/12): 30% mid LAD, 30% ostial D1, 50% ostial D2.    Cough 11/13/2013  Depression   Headache 02/22/2014  Hx of cardiovascular stress test   Lexiscan Myoview (2/16): Breast attenuation artifact, no ischemia, EF 64%; low risk  Hyperlipemia   Hypertension   Iron deficiency   hx of  Leukocytoclastic vasculitis ([name])   Rash across lower body, occurred in 9/11, diagnosed by skin biopsy. ANA positive. Thought to be secondary to cocaine use.  Leukopenia 03/21/2013  Lymphadenopathy 03/21/2013  Lymphadenopathy 01/30/2014  Mental disorder   Pancytopenia ([place])   Pancytopenia, acquired ([place]) 07/16/2015  Polysubstance abuse ([place])   Prior cocaine  Pulmonary HTN ([place])   Echo (9/11) with EF 65%, mild LVH, mild AI, mild MR, moderate to possibly severe TR with PA systolic pressure 52 mmHg. Echo (10/12): severe LV hypertrophy, EF 60-65%, mild MR, mild AI, moderate to severe tricuspid regurgitation, PA systolic pressure 38 mmHg.  Echo 4/14: moderate LVH, EF 65%, normal wall motion, diastolic dysfunction, mild AI, mild MR  Shortness of breath   a. PFTs 5/14:  FEV1/FVC 99% predicted; FVC 60% predicted; DLCO mildly reduced, mild restriction, air trapping.;  b.  seen by pulmo (Dr. [name]) 5/14: mainly upper airway symptoms - ACE d/c'd (sx's better)  Stroke [facility]) 2010ish  Syphilis 04/25/2013  Tobacco abuse   Tricuspid regurgitation   Unspecified deficiency anemia 03/29/2013 Past Surgical History: Past Surgical History: Procedure Laterality Date  ANKLE SURGERY    right  BONE MARROW ASPIRATION    CARDIAC CATHETERIZATION    CARDIAC CATHETERIZATION N/A 04/23/2015  Procedure: Right Heart Cath;  Surgeon: [name], MD;  Location: [facility] [facility];  Service: Cardiovascular;  Laterality: N/A;  CARDIOVERSION N/A 03/11/2018   Procedure: CARDIOVERSION;  Surgeon: [name], MD;  [facility] ENDOSCOPY;  Service: Cardiovascular;  Laterality: N/A;  I & D EXTREMITY  08/09/2011  Procedure: IRRIGATION AND DEBRIDEMENT EXTREMITY;  Surgeon: [name], MD;  Location: [place];  Service: General;  Laterality: Left;  i & D Left axilla abscess  LYMPH NODE BIOPSY N/A 04/05/2013  Procedure: EXCISIONAL BIOPSY RIGHT SUBMANDIBULAR NODE, NASAL ENDOSCOPIC WITH BIOPSY NASAL PHARYNX;  Surgeon: [name], MD;  Location: [place];  Service: ENT;  Laterality: N/A;  MULTIPLE EXTRACTIONS WITH ALVEOLOPLASTY N/A 03/03/2019  Procedure: MULTIPLE EXTRACTION WITH ALVEOLOPLASTY;  Surgeon: [name], DDS;  Location: [place];  Service: Oral Surgery;  Laterality: N/A;  TEE WITHOUT CARDIOVERSION N/A 03/11/2018  Procedure: TRANSESOPHAGEAL ECHOCARDIOGRAM (TEE);  Surgeon: [name], MD;  Location: [place];  Service: Cardiovascular;  Laterality:  N/A; HPI: Pt is a 55 y.o. female who presented with complaints of shortness of breath that had been progressively worsening two weeks prior to admission. CXR 10/5: Mild patchy infiltrate in the left mid lung. Pt reported to referring MD having intermittent difficulty swallowing and getting choked/coughing when eating. PMH: hypertension, hyperlipidemia, paroxysmal atrial fibrillation/atrial flutter on Xarelto, HFpEF, MR, CVA, CAD, COPD, cocaine abuse, and tobacco abuse. CT scans from 2020 did note concern for pharyngitis, tonsillitis, and supraglottitis with associated masslike thickening and edema of the hypopharynx at the level of the piriformis which could reflect acute inflammation versus underlying mass. BSE 12/18/18: mild oral dysphagia with prolonged oral phase with regular texture solids; pt discharged from SLP services on 9/28 on dysphagia 3/thin. Outpatient laryngoscopy 38/4/66 wih Dr. Erik Obey: "Nasopharynx is clear. Oropharynx is clear. Hypopharynx/larynx shows thickening of the epiglottis, thickening of the  right arytenoid and somewhat narrow supraglottis. Vocal cords are mobile and without lesions. Her larynx is abnormal although the pattern is difficult to identify. I wonder if this could be related to levamisole toxicity from her cocaine use."  Assessment / Plan / Recommendation CHL IP CLINICAL IMPRESSIONS 12/26/2020 Clinical Impression Pt presents with oropharyngeal dysphagia characterized by reduced tongue base retraction, impaired bolus cohesion, reduced laryngeal elevation, reduced anterior laryngeal movement, and a pharyngeal delay. The swallow was often triggered with the head of the bolus at the level of the pyriform sinuses. Penetration (PAS 3,5) was noted with thin liquids via cup and straw. Prompted coughing was effective in expelling the penetrate and laryngeal invasion was eliminated with cued reduction in bolus size. Pharyngeal clearance was [facility] and no esophageal stasis noted. However, pt demonstrated an effortful swallow with nectar thick liquids and with solids. Pt reported that she needed to do this "to get it down". Arytenoid(s) appear(s) enlarged and R arytenoid thickening was noted on laryngoscopy on 12/23/18. Anterior pharyngeal wall thickening/convex posterior protrusion (See image below) was noted at the level of C3-C5 which aligns with the "masslike thickening and edema of the hypopharynx at the level of the pyriform sinus" noted on the CT soft tissue on 12/20/18. Considering pt's history, and her reports of worsening dysphagia, SLP is in agreement with the repeat CT soft tissue ordered by Dr. Tamala Julian on 12/25/20, and recommends consideration of otolaryngology consult. Pt's current diet of dysphagia 3 solids and thin liquids will be continued with observance of swallowing precautions, and SLP will continue to follow pt. SLP Visit Diagnosis Dysphagia, oropharyngeal phase (R13.12) Attention and concentration deficit following -- Frontal lobe and executive function deficit following -- Impact on safety  and function Mild aspiration risk    [facility] IP TREATMENT RECOMMENDATION 12/26/2020 Treatment Recommendations Therapy as outlined in treatment plan below   Prognosis 12/26/2020 Prognosis for Safe Diet Advancement Fair Barriers to Reach Goals Time post onset Barriers/Prognosis Comment -- [facility] IP DIET RECOMMENDATION 12/26/2020 SLP Diet Recommendations Dysphagia 3 (Mech soft) solids;Thin liquid Liquid Administration via Cup;No straw Medication Administration Whole meds with puree Compensations Slow rate;Small sips/bites Postural Changes Seated upright at 90 degrees   No flowsheet data found.  [facility] IP FOLLOW UP RECOMMENDATIONS 12/26/2020 Follow up Recommendations None   [facility] IP FREQUENCY AND DURATION 12/26/2020 Speech Therapy Frequency (ACUTE ONLY) min 2x/week Treatment Duration 2 weeks      [facility] IP ORAL PHASE 12/26/2020 Oral Phase Impaired Oral - Pudding Teaspoon -- Oral - Pudding Cup -- Oral - Honey Teaspoon -- Oral - Honey Cup -- Oral - Nectar Teaspoon -- Oral - Nectar Cup --  Oral - Nectar Straw Decreased bolus cohesion;Premature spillage Oral - Thin Teaspoon -- Oral - Thin Cup Decreased bolus cohesion;Premature spillage Oral - Thin Straw Decreased bolus cohesion;Premature spillage Oral - Puree WFL Oral - Mech Soft WFL Oral - Regular -- Oral - Multi-Consistency -- Oral - Pill Decreased bolus cohesion;Premature spillage Oral Phase - Comment --  [facility] IP PHARYNGEAL PHASE 12/26/2020 Pharyngeal Phase Impaired Pharyngeal- Pudding Teaspoon -- Pharyngeal -- Pharyngeal- Pudding Cup -- Pharyngeal -- Pharyngeal- Honey Teaspoon -- Pharyngeal -- Pharyngeal- Honey Cup -- Pharyngeal -- Pharyngeal- Nectar Teaspoon -- Pharyngeal -- Pharyngeal- Nectar Cup -- Pharyngeal -- Pharyngeal- Nectar Straw Delayed swallow initiation-vallecula Pharyngeal -- Pharyngeal- Thin Teaspoon -- Pharyngeal -- Pharyngeal- Thin Cup Delayed swallow initiation-pyriform sinuses;Penetration/Aspiration during swallow Pharyngeal Material enters airway, remains ABOVE vocal cords  and not ejected out Pharyngeal- Thin Straw Delayed swallow initiation-pyriform sinuses;Penetration/Aspiration during swallow Pharyngeal Material enters airway, CONTACTS cords and not ejected out Pharyngeal- Puree WFL Pharyngeal -- Pharyngeal- Mechanical Soft WFL Pharyngeal -- Pharyngeal- Regular -- Pharyngeal -- Pharyngeal- Multi-consistency -- Pharyngeal -- Pharyngeal- Pill Delayed swallow initiation-pyriform sinuses;Penetration/Aspiration during swallow Pharyngeal Material enters airway, remains ABOVE vocal cords and not ejected out Pharyngeal Comment --  Shanika I. Hardin Negus, [place], [place] Office number [phone] Pager [phone] Horton Marshall 12/26/2020, 10:22 AM              ECHOCARDIOGRAM COMPLETE  Result Date: 12/25/2020    ECHOCARDIOGRAM REPORT   Patient Name:   MYRIAH BOGGUS Date of Exam: 12/25/2020 Medical Rec #:  [id number]    Height:       66.0 in Accession #:    [id number]   Weight:       162.0 lb Date of Birth:  01/18/1966    BSA:          1.828 m Patient Age:    3 years     BP:           158/117 mmHg Patient Gender: F            HR:           91 bpm. Exam Location:  Inpatient Procedure: 2D Echo, Cardiac Doppler and Color Doppler Indications:    Atrial Fibrillation I48.91  History:        Patient has prior history of Echocardiogram examinations, most                 recent 01/25/2019. CHF, CAD, COPD and Stroke, Arrythmias:Atrial                 Flutter; Risk Factors:Hypertension and Current Smoker. Mitral                 regurgitation. Mild pulmonary hypertension. Atrial flutter s/p                 ablation. Obstructive sleep apnea (CPAP). Polysubstance abuse.  Sonographer:    Alvino Chapel RCS Referring Phys: [id number] RONDELL A SMITH IMPRESSIONS  1. Left ventricular ejection fraction, by estimation, is 60 to 65%. The left ventricle has normal function. The left ventricle has no regional wall motion abnormalities. There is mild left ventricular hypertrophy. Left  ventricular diastolic function could not be evaluated.  2. Right ventricular systolic function is normal. The right ventricular size is normal. There is normal pulmonary artery systolic pressure. The estimated right ventricular systolic pressure is 54.6 mmHg.  3. Left atrial size was severely dilated.  4. Right atrial size was severely dilated.  5. The mitral valve  is abnormal. Moderate mitral valve regurgitation. Moderate mitral annular calcification.  6. The tricuspid valve is abnormal. Tricuspid valve regurgitation is moderate.  7. The aortic valve is tricuspid. Aortic valve regurgitation is moderate. Mild aortic valve sclerosis is present, with no evidence of aortic valve stenosis. Aortic regurgitation PHT measures 498 msec.  8. The inferior vena cava is normal in size with <50% respiratory variability, suggesting right atrial pressure of 8 mmHg. Comparison(s): Changes from prior study are noted. 01/25/2019: LVEF 60-65%, mild MR, mild to moderate AI, mild TR. FINDINGS  Left Ventricle: Left ventricular ejection fraction, by estimation, is 60 to 65%. The left ventricle has normal function. The left ventricle has no regional wall motion abnormalities. The left ventricular internal cavity size was normal in size. There is  mild left ventricular hypertrophy. Left ventricular diastolic function could not be evaluated due to atrial fibrillation. Left ventricular diastolic function could not be evaluated. Right Ventricle: The right ventricular size is normal. No increase in right ventricular wall thickness. Right ventricular systolic function is normal. There is normal pulmonary artery systolic pressure. The tricuspid regurgitant velocity is 2.51 m/s, and  with an assumed right atrial pressure of 8 mmHg, the estimated right ventricular systolic pressure is 50.0 mmHg. Left Atrium: Left atrial size was severely dilated. Right Atrium: Right atrial size was severely dilated. Pericardium: There is no evidence of pericardial  effusion. Mitral Valve: The mitral valve is abnormal. There is moderate calcification of the anterior and posterior mitral valve leaflet(s). Moderate mitral annular calcification. Moderate mitral valve regurgitation. Tricuspid Valve: The tricuspid valve is abnormal. Tricuspid valve regurgitation is moderate. Aortic Valve: The aortic valve is tricuspid. Aortic valve regurgitation is moderate. Aortic regurgitation PHT measures 498 msec. Mild aortic valve sclerosis is present, with no evidence of aortic valve stenosis. Pulmonic Valve: The pulmonic valve was grossly normal. Pulmonic valve regurgitation is trivial. Aorta: The aortic root and ascending aorta are structurally normal, with no evidence of dilitation. Venous: The inferior vena cava is normal in size with less than 50% respiratory variability, suggesting right atrial pressure of 8 mmHg. IAS/Shunts: No atrial level shunt detected by color flow Doppler.  LEFT VENTRICLE PLAX 2D LVIDd:         4.90 cm LVIDs:         3.20 cm LV PW:         1.30 cm LV IVS:        1.40 cm LVOT diam:     1.90 cm LV SV:         58 LV SV Index:   31 LVOT Area:     2.84 cm  LEFT ATRIUM              Index       RIGHT ATRIUM           Index LA diam:        4.10 cm  2.24 cm/m  RA Area:     31.10 cm LA Vol (A2C):   170.0 ml 92.97 ml/m RA Volume:   115.00 ml 62.89 ml/m LA Vol (A4C):   105.0 ml 57.42 ml/m LA Biplane Vol: 136.0 ml 74.38 ml/m  AORTIC VALVE LVOT Vmax:   126.00 cm/s LVOT Vmean:  86.700 cm/s LVOT VTI:    0.203 m AI PHT:      498 msec  AORTA Ao Root diam: 3.60 cm MITRAL VALVE                TRICUSPID VALVE MV  Area (PHT): 3.48 cm     TR Peak grad:   25.2 mmHg MV Decel Time: 218 msec     TR Vmax:        251.00 cm/s MR Peak grad: 134.6 mmHg MR Mean grad: 87.0 mmHg     SHUNTS MR Vmax:      580.00 cm/s   Systemic VTI:  0.20 m MR Vmean:     432.0 cm/s    Systemic Diam: 1.90 cm MV E velocity: 111.00 cm/s Lyman Bishop MD Electronically signed by Lyman Bishop MD Signature  Date/Time: 12/25/2020/5:11:22 PM    Final    ECHO TEE  Result Date: 12/27/2020    TRANSESOPHOGEAL ECHO REPORT   Patient Name:   [facility] Date of Exam: 12/27/2020 Medical Rec #:  [id number]    Height:       66.0 in Accession #:    [id number]   Weight:       159.4 lb Date of Birth:  1966/02/07    BSA:          1.816 m Patient Age:    55 years     BP:           127/92 mmHg Patient Gender: F            HR:           77 bpm. Exam Location:  Inpatient Procedure: Transesophageal Echo, Cardiac Doppler and Color Doppler Indications:     Atrial Flutter  History:         Patient has prior history of Echocardiogram examinations, most                  recent 12/25/2020.  Sonographer:     Philipp Deputy RDCS Referring Phys:  [id number] Abigail Butts Diagnosing Phys: [name] DO PROCEDURE: After discussion of the risks and benefits of a TEE, an informed consent was obtained from the patient. The transesophogeal probe was passed without difficulty through the esophogus of the patient. Imaged were obtained with the patient in a left lateral decubitus position. Local oropharyngeal anesthetic was provided with viscous lidocaine. Sedation performed by different physician. The patient was monitored while under deep sedation. Anesthestetic sedation was provided intravenously by Anesthesiology: 298mg  of Propofol. Image quality was good. The patient developed no complications during the procedure. IMPRESSIONS  1. Left ventricular ejection fraction, by estimation, is 55 to 60%. The left ventricle has normal function.  2. Right ventricular systolic function is normal. The right ventricular size is normal.  3. A left atrial/left atrial appendage thrombus was detected. The LAA emptying velocity was 22 cm/s.  4. The mitral valve is normal in structure. Mild to moderate mitral valve regurgitation.  5. The aortic valve is tricuspid. Aortic valve regurgitation is moderate. FINDINGS  Left Ventricle: Left ventricular ejection fraction, by  estimation, is 55 to 60%. The left ventricle has normal function. The left ventricular internal cavity size was normal in size. Right Ventricle: The right ventricular size is normal. No increase in right ventricular wall thickness. Right ventricular systolic function is normal. Left Atrium: Left atrial size was not well visualized. Spontaneous echo contrast was present in the left atrial appendage. A left atrial/left atrial appendage thrombus was detected. The LAA emptying velocity was 22 cm/s. Right Atrium: Right atrial size was not well visualized. Pericardium: There is no evidence of pericardial effusion. Mitral Valve: The mitral valve is normal in structure. Mild to moderate mitral valve regurgitation. Tricuspid Valve: The tricuspid valve  is normal in structure. Tricuspid valve regurgitation is trivial. Aortic Valve: The aortic valve is tricuspid. Aortic valve regurgitation is moderate. Pulmonic Valve: The pulmonic valve was normal in structure. Pulmonic valve regurgitation is trivial. Aorta: The aortic root is normal in size and structure. IAS/Shunts: No atrial level shunt detected by color flow Doppler. Godfrey Pick Tobb DO Electronically signed by [name] DO Signature Date/Time: 12/27/2020/5:43:49 PM    Final     Labs: BNP (last 3 results) Recent Labs    05/28/20 0741 12/25/20 0305  BNP 429.2* 361.4*   Basic Metabolic Panel: Recent Labs  Lab 12/25/20 0305 12/25/20 0630 12/26/20 0232 12/27/20 0314 12/28/20 0254  NA 138  --  135 136 136  K 3.8  --  3.3* 3.4* 4.5  CL 110  --  105 106 109  CO2 20*  --  20* 21* 22  GLUCOSE 108*  --  112* 88 96  BUN 22*  --  16 24* 19  CREATININE 1.19*  --  1.37* 1.39* 1.34*  CALCIUM 8.8*  --  9.1 8.8* 8.6*  MG  --  2.1  --  2.2  --   PHOS  --   --   --  3.8  --    Liver Function Tests: Recent Labs  Lab 12/25/20 1223  AST 28  ALT 32  ALKPHOS 87  BILITOT 1.1  PROT 7.4  ALBUMIN 3.6   No results for input(s): LIPASE, AMYLASE in the last 168  hours. No results for input(s): AMMONIA in the last 168 hours. CBC: Recent Labs  Lab 12/25/20 0305 12/27/20 0314 12/28/20 0254  WBC 7.3 5.4 5.1  NEUTROABS 3.6  --   --   HGB 11.9* 12.0 11.2*  HCT 37.0 37.7 34.9*  MCV 83.9 83.4 84.1  PLT 166 167 183   Cardiac Enzymes: No results for input(s): CKTOTAL, CKMB, CKMBINDEX, TROPONINI in the last 168 hours. BNP: Invalid input(s): POCBNP CBG: No results for input(s): GLUCAP in the last 168 hours. D-Dimer No results for input(s): DDIMER in the last 72 hours. Hgb A1c Recent Labs    12/26/20 0232  HGBA1C 5.2   Lipid Profile Recent Labs    12/26/20 0232  CHOL 152  HDL 48  LDLCALC 94  TRIG 51  CHOLHDL 3.2   Thyroid function studies No results for input(s): TSH, T4TOTAL, T3FREE, THYROIDAB in the last 72 hours.  Invalid input(s): FREET3 Anemia work up No results for input(s): VITAMINB12, FOLATE, FERRITIN, TIBC, IRON, RETICCTPCT in the last 72 hours. Urinalysis    Component Value Date/Time   COLORURINE STRAW (A) 12/25/2020 0613   APPEARANCEUR CLEAR 12/25/2020 0613   LABSPEC 1.008 12/25/2020 0613   PHURINE 7.0 12/25/2020 0613   GLUCOSEU NEGATIVE 12/25/2020 0613   HGBUR SMALL (A) 12/25/2020 0613   BILIRUBINUR NEGATIVE 12/25/2020 0613   BILIRUBINUR negative 06/29/2018 0928   BILIRUBINUR NEG 04/23/2014 1245   KETONESUR NEGATIVE 12/25/2020 0613   PROTEINUR NEGATIVE 12/25/2020 0613   UROBILINOGEN 0.2 06/29/2018 0928   UROBILINOGEN 0.2 09/20/2013 1805   NITRITE NEGATIVE 12/25/2020 0613   LEUKOCYTESUR NEGATIVE 12/25/2020 4315   Sepsis Labs Invalid input(s): PROCALCITONIN,  WBC,  LACTICIDVEN Microbiology Recent Results (from the past 240 hour(s))  Resp Panel by RT-PCR (Flu A&B, Covid) Nasopharyngeal Swab     Status: None   Collection Time: 12/25/20  3:05 AM   Specimen: Nasopharyngeal Swab; Nasopharyngeal(NP) swabs in vial transport medium  Result Value Ref Range Status   SARS Coronavirus 2 by RT PCR NEGATIVE  NEGATIVE  Final    Comment: (NOTE) SARS-CoV-2 target nucleic acids are NOT DETECTED.  The SARS-CoV-2 RNA is generally detectable in upper respiratory specimens during the acute phase of infection. The lowest concentration of SARS-CoV-2 viral copies this assay can detect is 138 copies/mL. A negative result does not preclude SARS-Cov-2 infection and should not be used as the sole basis for treatment or other patient management decisions. A negative result may occur with  improper specimen collection/handling, submission of specimen other than nasopharyngeal swab, presence of viral mutation(s) within the areas targeted by this assay, and inadequate number of viral copies(<138 copies/mL). A negative result must be combined with clinical observations, patient history, and epidemiological information. The expected result is Negative.  Fact Sheet for Patients:  EntrepreneurPulse.com.au  Fact Sheet for Healthcare Providers:  IncredibleEmployment.be  This test is no t yet approved or cleared by the [place] FDA and  has been authorized for detection and/or diagnosis of SARS-CoV-2 by FDA under an Emergency Use Authorization (EUA). This EUA will remain  in effect (meaning this test can be used) for the duration of the COVID-19 declaration under Section 564(b)(1) of the Act, 21 U.S.C.section 360bbb-3(b)(1), unless the authorization is terminated  or revoked sooner.       Influenza A by PCR NEGATIVE NEGATIVE Final   Influenza B by PCR NEGATIVE NEGATIVE Final    Comment: (NOTE) The Xpert Xpress SARS-CoV-2/FLU/RSV plus assay is intended as an aid in the diagnosis of influenza from Nasopharyngeal swab specimens and should not be used as a sole basis for treatment. Nasal washings and aspirates are unacceptable for Xpert Xpress SARS-CoV-2/FLU/RSV testing.  Fact Sheet for Patients: EntrepreneurPulse.com.au  Fact Sheet for Healthcare  Providers: IncredibleEmployment.be  This test is not yet approved or cleared by the [place] FDA and has been authorized for detection and/or diagnosis of SARS-CoV-2 by FDA under an Emergency Use Authorization (EUA). This EUA will remain in effect (meaning this test can be used) for the duration of the COVID-19 declaration under Section 564(b)(1) of the Act, 21 U.S.C. section 360bbb-3(b)(1), unless the authorization is terminated or revoked.  Performed at [facility], [place] [address], [place], [place] [address]      Time coordinating discharge: 35 minutes  SIGNED: Antonieta Pert, MD  [facility] 12/28/2020, 12:18 PM  If 7PM-7AM, please contact night-coverage [email or web address]

## 2020-12-28 NOTE — Progress Notes (Signed)
Progress Note  Patient Name: Marc Sivertsen Date of Encounter: 12/28/2020  [facility] Cardiologist: [name], MD   Subjective   Feels better, in chair no SOB  Inpatient Medications    Scheduled Meds:  amiodarone  200 mg Oral Daily   atorvastatin  40 mg Oral QHS   cloNIDine  0.05 mg Oral BID   diltiazem  300 mg Oral Daily   furosemide  40 mg Oral BID   guaiFENesin  600 mg Oral BID   lisinopril  10 mg Oral Daily   nicotine  14 mg Transdermal Daily   potassium chloride SA  20 mEq Oral Daily   rivaroxaban  20 mg Oral Q supper   sodium chloride flush  3 mL Intravenous Q12H   spironolactone  25 mg Oral Daily   topiramate  50 mg Oral QHS   Continuous Infusions:  sodium chloride     ampicillin-sulbactam (UNASYN) IV 3 g (12/28/20 0859)   PRN Meds: sodium chloride, acetaminophen, hydrALAZINE, nitroGLYCERIN, ondansetron (ZOFRAN) IV, sodium chloride flush   Vital Signs    Vitals:   12/27/20 2351 12/28/20 0503 12/28/20 0746 12/28/20 1100  BP: 114/78 (!) 120/98 (!) 139/107 130/80  Pulse: 80 69 73 74  Resp: 12 (!) 21 20 18   Temp: 98 F (36.7 C) 98.5 F (36.9 C) 98.6 F (37 C) 98 F (36.7 C)  TempSrc: Oral Oral Oral Oral  SpO2: 100% 100% 100% 98%  Weight:  75.6 kg      Intake/Output Summary (Last 24 hours) at 12/28/2020 1115 Last data filed at 12/28/2020 0819 Gross per 24 hour  Intake 380 ml  Output --  Net 380 ml   Last 3 Weights 12/28/2020 12/27/2020 12/26/2020  Weight (lbs) 166 lb 10.7 oz 159 lb 6.3 oz 158 lb 4.6 oz  Weight (kg) 75.6 kg 72.3 kg 71.8 kg  Some encounter information is confidential and restricted. Go to Review Flowsheets activity to see all data.      Telemetry    Atypical atrial flutter - rate controlled 80's - Personally Reviewed  ECG    No new - Personally Reviewed  Physical Exam   GEN: No acute distress.   Neck: No JVD Cardiac: mildly irreg irreg, 2/6 HSM, no rubs, or gallops.  Respiratory: Clear to auscultation  bilaterally. GI: Soft, nontender, non-distended  MS: No edema; No deformity. Neuro:  Nonfocal  Psych: Normal affect   Labs    High Sensitivity Troponin:   Recent Labs  Lab 12/25/20 0305 12/25/20 0630  TROPONINIHS 20* 20*     Chemistry Recent Labs  Lab 12/25/20 0630 12/25/20 1223 12/26/20 0232 12/27/20 0314 12/28/20 0254  NA  --   --  135 136 136  K  --   --  3.3* 3.4* 4.5  CL  --   --  105 106 109  CO2  --   --  20* 21* 22  GLUCOSE  --   --  112* 88 96  BUN  --   --  16 24* 19  CREATININE  --   --  1.37* 1.39* 1.34*  CALCIUM  --   --  9.1 8.8* 8.6*  MG 2.1  --   --  2.2  --   PROT  --  7.4  --   --   --   ALBUMIN  --  3.6  --   --   --   AST  --  28  --   --   --  ALT  --  32  --   --   --   ALKPHOS  --  87  --   --   --   BILITOT  --  1.1  --   --   --   GFRNONAA  --   --  45* 44* 46*  ANIONGAP  --   --  10 9 5     Lipids  Recent Labs  Lab 12/26/20 0232  CHOL 152  TRIG 51  HDL 48  LDLCALC 94  CHOLHDL 3.2    Hematology Recent Labs  Lab 12/25/20 0305 12/27/20 0314 12/28/20 0254  WBC 7.3 5.4 5.1  RBC 4.41 4.52 4.15  HGB 11.9* 12.0 11.2*  HCT 37.0 37.7 34.9*  MCV 83.9 83.4 84.1  MCH 27.0 26.5 27.0  MCHC 32.2 31.8 32.1  RDW 15.6* 15.3 15.1  PLT 166 167 183   Thyroid  Recent Labs  Lab 12/25/20 0630  TSH 2.038    BNP Recent Labs  Lab 12/25/20 0305  BNP 464.0*    DDimer No results for input(s): DDIMER in the last 168 hours.   Radiology    CT SOFT TISSUE NECK W CONTRAST  Result Date: 12/28/2020 CLINICAL DATA:  55 year old female with hoarseness. Suspected tonsillitis and pharyngitis on CTs last month. Intermittent dysphagia. EXAM: CT NECK WITH CONTRAST TECHNIQUE: Multidetector CT imaging of the neck was performed using the standard protocol following the bolus administration of intravenous contrast. CONTRAST:  153mL OMNIPAQUE IOHEXOL 350 MG/ML SOLN COMPARISON:  12/20/2018 and 12/16/2018 neck CTs. FINDINGS: Pharynx and larynx: Decreased  hyperenhancement of the palatine tonsils, slightly decreased in size since 12/20/2018. However, at the same time the adenoids are slightly more enhancing now (series 3, image 29). Left parapharyngeal space inflammation appears largely resolved. Negative right parapharyngeal space. Retropharyngeal space also relatively normal. Epiglottis is stable and within normal limits. Thickening of the bilateral AE folds has decreased but not resolved, remaining greater on the right. True cords appear normal. Salivary glands: Sublingual space remains within normal limits. Submandibular and parotid spaces are stable. Thyroid: Negative. Lymph nodes: Bilateral cervical nodes show decreased enhancement since 12/16/2018. Lymph node size has mildly decreased overall. The largest nodes are stable. No cystic or necrotic node identified. Vascular: Major vascular structures in the neck and at the skull base remain patent. Right greater than left calcified carotid atherosclerosis. Calcified atherosclerosis at the skull base. Limited intracranial: Mild nonspecific cerebral white matter hypodensity. Visualized orbits: Negative. Mastoids and visualized paranasal sinuses: Increased bilateral paranasal sinus mucosal thickening, mild and most pronounced in the left frontoethmoidal recess. New subtotal opacification of the left tympanic cavity also, with scattered new left mastoid effusion and mastoid fluid levels. No mastoid coalescence or bone erosion identified. Right middle ear and mastoids remain clear. Skeleton: All dentition has been extracted since last month. No acute osseous abnormality identified. Upper chest: Negative visible lungs aside from mild atelectasis. Calcified aortic atherosclerosis. No superior mediastinal lymphadenopathy. IMPRESSION: 1. Improving Tonsillitis and cervical lymphadenopathy since last month. No neck abscess identified. Resolved parapharyngeal space inflammation, but there is unresolved edema in the hypopharynx  and supraglottic larynx - but sparing the epiglottis. 2. New left middle ear and mastoid effusion since last month, probably due to left-side eustachian tube dysfunction from persistent adenoid hypertrophy. No complicating features identified. 3. All dentition has been extracted since last month. 4. Mild new bilateral paranasal sinus inflammation. 5. Aortic Atherosclerosis (ICD10-I70.0). Electronically Signed   By: Genevie Ann M.D.   On: 12/28/2020 08:36  ECHO TEE  Result Date: 12/27/2020    TRANSESOPHOGEAL ECHO REPORT   Patient Name:   NAKARI BRACKNELL Date of Exam: 12/27/2020 Medical Rec #:  [id number]    Height:       66.0 in Accession #:    [id number]   Weight:       159.4 lb Date of Birth:  03-16-1966    BSA:          1.816 m Patient Age:    59 years     BP:           127/92 mmHg Patient Gender: F            HR:           77 bpm. Exam Location:  Inpatient Procedure: Transesophageal Echo, Cardiac Doppler and Color Doppler Indications:     Atrial Flutter  History:         Patient has prior history of Echocardiogram examinations, most                  recent 12/25/2020.  Sonographer:     Philipp Deputy RDCS Referring Phys:  [id number] Abigail Butts Diagnosing Phys: [name] DO PROCEDURE: After discussion of the risks and benefits of a TEE, an informed consent was obtained from the patient. The transesophogeal probe was passed without difficulty through the esophogus of the patient. Imaged were obtained with the patient in a left lateral decubitus position. Local oropharyngeal anesthetic was provided with viscous lidocaine. Sedation performed by different physician. The patient was monitored while under deep sedation. Anesthestetic sedation was provided intravenously by Anesthesiology: 298mg  of Propofol. Image quality was good. The patient developed no complications during the procedure. IMPRESSIONS  1. Left ventricular ejection fraction, by estimation, is 55 to 60%. The left ventricle has normal function.  2.  Right ventricular systolic function is normal. The right ventricular size is normal.  3. A left atrial/left atrial appendage thrombus was detected. The LAA emptying velocity was 22 cm/s.  4. The mitral valve is normal in structure. Mild to moderate mitral valve regurgitation.  5. The aortic valve is tricuspid. Aortic valve regurgitation is moderate. FINDINGS  Left Ventricle: Left ventricular ejection fraction, by estimation, is 55 to 60%. The left ventricle has normal function. The left ventricular internal cavity size was normal in size. Right Ventricle: The right ventricular size is normal. No increase in right ventricular wall thickness. Right ventricular systolic function is normal. Left Atrium: Left atrial size was not well visualized. Spontaneous echo contrast was present in the left atrial appendage. A left atrial/left atrial appendage thrombus was detected. The LAA emptying velocity was 22 cm/s. Right Atrium: Right atrial size was not well visualized. Pericardium: There is no evidence of pericardial effusion. Mitral Valve: The mitral valve is normal in structure. Mild to moderate mitral valve regurgitation. Tricuspid Valve: The tricuspid valve is normal in structure. Tricuspid valve regurgitation is trivial. Aortic Valve: The aortic valve is tricuspid. Aortic valve regurgitation is moderate. Pulmonic Valve: The pulmonic valve was normal in structure. Pulmonic valve regurgitation is trivial. Aorta: The aortic root is normal in size and structure. IAS/Shunts: No atrial level shunt detected by color flow Doppler. Godfrey Pick Tobb DO Electronically signed by [name] DO Signature Date/Time: 12/27/2020/5:43:49 PM    Final     Cardiac Studies   TEE-normal EF, moderate MR, moderate aortic regurgitation.  Small left atrial appendage thrombus.  Patient Profile     55 y.o. female with small left atrial  appendage thrombus, atrial flutter post ablation with community-acquired pneumonia.  Assessment & Plan     Atrial flutter/left atrial appendage thrombus - Had ablative therapy in 2018.  She is now back in atrial flutter.  Her diltiazem has been dose adjusted.  She is on diltiazem CD 300 mg daily - heart rate is well controlled.  She is on Xarelto.  Continue.  Continue with amiodarone 200 mg a day. If she maintains atrial flutter after 3 weeks of continued therapy, she may proceed with elective outpatient cardioversion as long as she has not missed any doses of her Xarelto.  Essential hypertension - Uncontrolled - Her drug regimen is being adjusted.  If she does go home, okay to discontinue clonidine which is currently on a mild taper -Diltiazem CD has been increased to 300 mg.  She is also on lisinopril 10 mg a day.  She has been started on spironolactone 25 mg a day.  Has a history of hypokalemia as well.  Check basic metabolic profile in 1 week.  Community-acquired pneumonia - Continue with antibiotics per primary team.  Cocaine use - Encourage cessation  Chronic kidney disease stage IIIa -Creatinine 1.34.  Okay to continue with home Lasix dose.  GFR 46 estimated.  Okay with discharge from our perspective.  She has follow-up with Dr. Oval Linsey at [facility] clinic.  We will go ahead and sign off.    For questions or updates, please contact [place] Please consult [email or web address] for contact info under        Signed, Candee Furbish, MD  12/28/2020, 11:15 AM

## 2020-12-28 NOTE — Plan of Care (Signed)

## 2020-12-29 ENCOUNTER — Encounter (HOSPITAL_COMMUNITY): Payer: Self-pay | Admitting: Cardiology

## 2020-12-30 ENCOUNTER — Telehealth: Payer: Self-pay | Admitting: Family Medicine

## 2020-12-30 ENCOUNTER — Telehealth: Payer: Self-pay

## 2020-12-30 NOTE — Telephone Encounter (Signed)
Pt is calling for her lab results.. Please advise CB- [phone]

## 2020-12-30 NOTE — Telephone Encounter (Signed)
Transition Care Management Unsuccessful Follow-up Telephone Call  Date of discharge and from where:  12/28/2020, [facility]   Attempts:  1st Attempt  Reason for unsuccessful TCM follow-up call:  Left voice message on # [phone]. Call also placed to # [phone], phone just rings, no option to leave a message.  Patient needs to schedule follow up appointment with PCP

## 2020-12-31 ENCOUNTER — Telehealth: Payer: Self-pay

## 2020-12-31 NOTE — Telephone Encounter (Signed)
Transition Care Management Follow-up Telephone Call Date of discharge and from where: 12/28/2020, [facility] How have you been since you were released from the hospital? She said she is feeling great. Any questions or concerns? No- none reported   Items Reviewed: Did the pt receive and understand the discharge instructions provided? Yes  Medications obtained and verified? Yes  - she said she has everything Other? No  Any new allergies since your discharge? No  Dietary orders reviewed? No Do you have support at home? Yes   Home Care and Equipment/Supplies: Were home health services ordered? no If so, what is the name of the agency? N/a  Has the agency set up a time to come to the patient's home? not applicable Were any new equipment or medical supplies ordered?  No What is the name of the medical supply agency? N/a Were you able to get the supplies/equipment? not applicable Do you have any questions related to the use of the equipment or supplies? No  Functional Questionnaire: (I = Independent and D = Dependent) ADLs: independent   Follow up appointments reviewed:  PCP Hospital f/u appt confirmed?  She wanted to schedule an appointment and the call was then dropped. Call returned to patient # [phone], message left with call back requested to this CM.   [facility] f/u appt confirmed? Yes  Scheduled to see cardiology - 01/14/2021. She also said that she scheduled an appointment with ENT. The call was dropped prior to her giving the appointment date.   Are transportation arrangements needed?  Not addressed, call dropped.  If their condition worsens, is the pt aware to call PCP or go to the Emergency Dept.? The call was dropped prior to discussing this; however, this information is on her AVS.  Was the patient provided with contact information for the PCP's office or ED? Yes Was to pt encouraged to call back with questions or concerns? The call was dropped prior to  discussing this.

## 2020-12-31 NOTE — Telephone Encounter (Signed)
Pt was called and informed that her recent labs were done in the hospital not her at the clinic.

## 2021-01-10 ENCOUNTER — Other Ambulatory Visit: Payer: Self-pay

## 2021-01-10 MED ORDER — FLUTICASONE PROPIONATE 50 MCG/ACT NA SUSP
NASAL | 11 refills | Status: DC
  Filled 2021-01-10: qty 16, 30d supply, fill #0
  Filled 2021-02-15 – 2021-02-24 (×2): qty 16, 30d supply, fill #1
  Filled 2021-04-08 – 2021-04-09 (×2): qty 16, 30d supply, fill #0
  Filled 2021-06-23 – 2021-07-02 (×2): qty 16, 30d supply, fill #1
  Filled 2021-07-25 – 2021-08-06 (×2): qty 16, 30d supply, fill #2
  Filled 2021-08-25 – 2021-09-09 (×2): qty 16, 30d supply, fill #3
  Filled 2021-10-08: qty 16, 30d supply, fill #4
  Filled 2021-11-10: qty 16, 30d supply, fill #5
  Filled 2021-12-12: qty 16, 30d supply, fill #6

## 2021-01-10 MED ORDER — OMEPRAZOLE 40 MG PO CPDR
DELAYED_RELEASE_CAPSULE | ORAL | 2 refills | Status: DC
  Filled 2021-01-10: qty 30, 30d supply, fill #0
  Filled 2021-02-15 – 2021-02-24 (×2): qty 30, 30d supply, fill #1
  Filled 2021-04-08 – 2021-04-09 (×2): qty 30, 30d supply, fill #0

## 2021-01-13 NOTE — Progress Notes (Deleted)
Office Visit    Patient Name: Morgan Bean Date of Encounter: 01/13/2021  PCP:  [name], MD   [place]  Cardiologist:  [name], MD  Advanced Practice Provider:  No care team member to display Electrophysiologist:  None    Chief Complaint    Morgan Bean is a 55 y.o. female with a hx of hypertension, hyperlipidemia, paroxysmal atrial fibrillation/flutter on Xarelto, chronic diastolic heart failure, MR, CVA, CAD COPD, coaine use, tobacco abuse presents today for hospital follow up   Past Medical History    Past Medical History:  Diagnosis Date   Anemia of other chronic disease 11/13/2013   Anginal pain ([place])    none in past year   Anxiety    Asthma    patient denies   Candidiasis 06/23/2013   Chronic diastolic CHF (congestive heart failure) ([place])    Constipation    Coronary artery disease    Lexiscan myoview (10/12) with significant ST depression upon Lexiscan injection but no ischemia or infarction on perfusion images. Left heart cath (10/12): 30% mid LAD, 30% ostial D1, 50% ostial D2.     Cough 11/13/2013   Depression    Headache 02/22/2014   Hx of cardiovascular stress test    Lexiscan Myoview (2/16): Breast attenuation artifact, no ischemia, EF 64%; low risk   Hyperlipemia    Hypertension    Iron deficiency    hx of   Leukocytoclastic vasculitis ([name])    Rash across lower body, occurred in 9/11, diagnosed by skin biopsy. ANA positive. Thought to be secondary to cocaine use.   Leukopenia 03/21/2013   Lymphadenopathy 03/21/2013   Lymphadenopathy 01/30/2014   Mental disorder    Pancytopenia ([place])    Pancytopenia, acquired ([place]) 07/16/2015   Polysubstance abuse ([place])    Prior cocaine   Pulmonary HTN ([place])    Echo (9/11) with EF 65%, mild LVH, mild AI, mild MR, moderate to possibly severe TR with PA systolic pressure 52 mmHg. Echo (10/12): severe LV hypertrophy, EF 60-65%, mild MR, mild AI, moderate to severe tricuspid  regurgitation, PA systolic pressure 38 mmHg.  Echo 4/14: moderate LVH, EF 65%, normal wall motion, diastolic dysfunction, mild AI, mild MR   Shortness of breath    a. PFTs 5/14:  FEV1/FVC 99% predicted; FVC 60% predicted; DLCO mildly reduced, mild restriction, air trapping.;  b.  seen by pulmo (Dr. [name]) 5/14: mainly upper airway symptoms - ACE d/c'd (sx's better)   Stroke [facility]) 2010ish   Syphilis 04/25/2013   Tobacco abuse    Tricuspid regurgitation    Unspecified deficiency anemia 03/29/2013   Past Surgical History:  Procedure Laterality Date   ANKLE SURGERY     right   BONE MARROW ASPIRATION     CARDIAC CATHETERIZATION     CARDIAC CATHETERIZATION N/A 04/23/2015   Procedure: Right Heart Cath;  Surgeon: [name], MD;  Location: [place] [facility];  Service: Cardiovascular;  Laterality: N/A;   CARDIOVERSION N/A 03/11/2018   Procedure: CARDIOVERSION;  Surgeon: [name], MD;  Location: [place];  Service: Cardiovascular;  Laterality: N/A;   I & D EXTREMITY  08/09/2011   Procedure: IRRIGATION AND DEBRIDEMENT EXTREMITY;  Surgeon: [name], MD;  Location: [place];  Service: General;  Laterality: Left;  i & D Left axilla abscess   LYMPH NODE BIOPSY N/A 04/05/2013   Procedure: EXCISIONAL BIOPSY RIGHT SUBMANDIBULAR NODE, NASAL ENDOSCOPIC WITH BIOPSY NASAL PHARYNX;  Surgeon: [name], MD;  Location: [facility];  Service: ENT;  Laterality: N/A;   MULTIPLE EXTRACTIONS WITH ALVEOLOPLASTY N/A 03/03/2019   Procedure: MULTIPLE EXTRACTION WITH ALVEOLOPLASTY;  Surgeon: [name], DDS;  Location: [place];  Service: Oral Surgery;  Laterality: N/A;   TEE WITHOUT CARDIOVERSION N/A 03/11/2018   Procedure: TRANSESOPHAGEAL ECHOCARDIOGRAM (TEE);  Surgeon: [name], MD;  Location: [place];  Service: Cardiovascular;  Laterality: N/A;   TEE WITHOUT CARDIOVERSION N/A 12/27/2020   Procedure: TRANSESOPHAGEAL ECHOCARDIOGRAM (TEE);  Surgeon: [name], DO;  Location: [facility];  Service:  Cardiovascular;  Laterality: N/A;    Allergies  Allergies  Allergen Reactions   Ciprofloxacin Itching    History of Present Illness    Morgan Bean is a 55 y.o. female with a hx of hypertension, hyperlipidemia, paroxysmal atrial fibrillation/flutter on Xarelto, chronic diastolic heart failure, MR, CVA, CAD COPD, coaine use, tobacco abuse last seen while hospitalized.  Prior atrial flutter ablation in 2018. She was admitted 12/25/20-12/28/20. UDS was positive for cocaine. CXR with mild patch yinfiltrate of the left lung. He had atrial fib with RVR. He was treated for aspiration pneumonia. He underwent TEE 12/27/20 and cardioversion was not performed as left atrial thrombus detected.[name] He was discharged on Amiodarone.  She presents today for follow up. ***   EKGs/Labs/Other Studies Reviewed:   The following studies were reviewed today:  Echo TEE 12/27/20   1. Left ventricular ejection fraction, by estimation, is 55 to 60%. The  left ventricle has normal function.   2. Right ventricular systolic function is normal. The right ventricular  size is normal.   3. A left atrial/left atrial appendage thrombus was detected. The LAA  emptying velocity was 22 cm/s.   4. The mitral valve is normal in structure. Mild to moderate mitral valve  regurgitation.   5. The aortic valve is tricuspid. Aortic valve regurgitation is moderate.   EKG:  EKG is  ordered today.  The ekg ordered today demonstrates ***  Recent Labs: 12/17/2020: NT-Pro BNP 2,714 12/25/2020: ALT 32; B Natriuretic Peptide 464.0; TSH 2.038 12/27/2020: Magnesium 2.2 12/28/2020: BUN 19; Creatinine, Ser 1.34; Hemoglobin 11.2; Platelets 183; Potassium 4.5; Sodium 136  Recent Lipid Panel    Component Value Date/Time   CHOL 152 12/26/2020 0232   TRIG 51 12/26/2020 0232   HDL 48 12/26/2020 0232   CHOLHDL 3.2 12/26/2020 0232   VLDL 10 12/26/2020 0232   LDLCALC 94 12/26/2020 0232   LDLDIRECT 142.1 12/25/2010 0846    Risk  Assessment/Calculations:   CHA2DS2-VASc Score = 4   This indicates a 4.8% annual risk of stroke. The patient's score is based upon: CHF History: 1 HTN History: 1 Diabetes History: 0 Stroke History: 0 Vascular Disease History: 1 Age Score: 0 Gender Score: 1    Home Medications   No outpatient medications have been marked as taking for the 01/14/21 encounter (Appointment) with Loel Dubonnet, NP.     Review of Systems   ***   All other systems reviewed and are otherwise negative except as noted above.  Physical Exam    VS:  LMP 05/10/2013  , BMI There is no height or weight on file to calculate BMI.  Wt Readings from Last 3 Encounters:  12/28/20 166 lb 10.7 oz (75.6 kg)  12/24/20 162 lb (73.5 kg)  09/18/20 166 lb 12.8 oz (75.7 kg)     GEN: Well nourished, well developed, in no acute distress. HEENT: normal. Neck: Supple, no JVD, carotid bruits, or masses. Cardiac: ***RRR, no  murmurs, rubs, or gallops. No clubbing, cyanosis, edema.  ***Radials/PT 2+ and equal bilaterally.  Respiratory:  ***Respirations regular and unlabored, clear to auscultation bilaterally. GI: Soft, nontender, nondistended. MS: No deformity or atrophy. Skin: Warm and dry, no rash. Neuro:  Strength and sensation are intact. Psych: Normal affect.  Assessment & Plan    Atrial flutter - Previous ablation 2018. ***  Diastolic heart failure / Mild to moderate MR -   Community acquired pneumonia -  HTN -   Hypokalemia -   HLD -   Cocaine/tobacco use -   CKDIII -   Disposition: Follow up {follow up:15908} with Dr. Oval Linsey or APP.  Signed, Loel Dubonnet, NP 01/13/2021, 8:06 PM [place]

## 2021-01-14 ENCOUNTER — Ambulatory Visit (HOSPITAL_BASED_OUTPATIENT_CLINIC_OR_DEPARTMENT_OTHER): Payer: Medicaid Other | Admitting: Family

## 2021-01-15 ENCOUNTER — Other Ambulatory Visit: Payer: Self-pay

## 2021-01-24 ENCOUNTER — Ambulatory Visit: Payer: Medicaid Other | Admitting: Cardiology

## 2021-02-15 ENCOUNTER — Other Ambulatory Visit: Payer: Self-pay | Admitting: Family Medicine

## 2021-02-17 ENCOUNTER — Other Ambulatory Visit: Payer: Self-pay | Admitting: Family Medicine

## 2021-02-17 ENCOUNTER — Other Ambulatory Visit: Payer: Self-pay

## 2021-02-19 MED ORDER — HYDROXYZINE HCL 50 MG PO TABS
50.0000 mg | ORAL_TABLET | Freq: Three times a day (TID) | ORAL | 1 refills | Status: DC
  Filled 2021-02-19 – 2021-04-09 (×3): qty 30, 10d supply, fill #0

## 2021-02-20 ENCOUNTER — Other Ambulatory Visit: Payer: Self-pay

## 2021-02-23 ENCOUNTER — Other Ambulatory Visit: Payer: Self-pay | Admitting: Family Medicine

## 2021-02-24 ENCOUNTER — Other Ambulatory Visit: Payer: Self-pay

## 2021-03-05 ENCOUNTER — Ambulatory Visit: Payer: Medicaid Other | Admitting: Family Medicine

## 2021-03-28 ENCOUNTER — Encounter (HOSPITAL_BASED_OUTPATIENT_CLINIC_OR_DEPARTMENT_OTHER): Payer: Self-pay | Admitting: Cardiovascular Disease

## 2021-04-08 ENCOUNTER — Other Ambulatory Visit: Payer: Self-pay | Admitting: Family Medicine

## 2021-04-08 ENCOUNTER — Other Ambulatory Visit: Payer: Self-pay | Admitting: Physician Assistant

## 2021-04-08 DIAGNOSIS — E876 Hypokalemia: Secondary | ICD-10-CM

## 2021-04-08 NOTE — Telephone Encounter (Signed)
Requested medications are due for refill today.  yes  Requested medications are on the active medications list.  yes  Last refill. 07/10/2020 #30 with 1 refill  Future visit scheduled.   no  Notes to clinic.  Prescription expired 03/26/2021.    Requested Prescriptions  Pending Prescriptions Disp Refills   potassium chloride SA (KLOR-CON M) 20 MEQ tablet 30 tablet 1    Sig: Take 1 tablet (20 mEq total) by mouth daily.     Endocrinology:  Minerals - Potassium Supplementation Failed - 04/08/2021  7:55 PM      Failed - Cr in normal range and within 360 days    Creatinine  Date Value Ref Range Status  03/06/2014 1.1 0.6 - 1.1 mg/dL Final   Creat  Date Value Ref Range Status  04/23/2014 1.01 0.50 - 1.10 mg/dL Final   Creatinine, Ser  Date Value Ref Range Status  12/28/2020 1.34 (H) 0.44 - 1.00 mg/dL Final          Passed - K in normal range and within 360 days    Potassium  Date Value Ref Range Status  12/28/2020 4.5 3.5 - 5.1 mmol/L Final  03/06/2014 4.0 3.5 - 5.1 mEq/L Final          Passed - Valid encounter within last 12 months    Recent Outpatient Visits           3 months ago Hypertensive heart disease with chronic combined systolic and diastolic congestive heart failure ([place])   [place], Charlane Ferretti, MD   3 months ago Atrial flutter with rapid ventricular response ([place])   [place], Enobong, MD   6 months ago Hoarseness of voice   [place], Enobong, MD   9 months ago Hypokalemia   [place], [place]   2 years ago Rheumatoid factor positive   [place], Enobong, MD       Future Appointments             In 2 days Gilford Rile, Martie Lee, NP [facility]

## 2021-04-09 ENCOUNTER — Other Ambulatory Visit: Payer: Self-pay

## 2021-04-09 ENCOUNTER — Other Ambulatory Visit (HOSPITAL_BASED_OUTPATIENT_CLINIC_OR_DEPARTMENT_OTHER): Payer: Self-pay

## 2021-04-09 ENCOUNTER — Other Ambulatory Visit: Payer: Self-pay | Admitting: Physician Assistant

## 2021-04-09 DIAGNOSIS — E876 Hypokalemia: Secondary | ICD-10-CM

## 2021-04-09 MED ORDER — POTASSIUM CHLORIDE CRYS ER 20 MEQ PO TBCR
20.0000 meq | EXTENDED_RELEASE_TABLET | Freq: Every day | ORAL | 1 refills | Status: DC
  Filled 2021-04-09: qty 30, 30d supply, fill #0

## 2021-04-09 NOTE — Telephone Encounter (Signed)
Requested Prescriptions  Pending Prescriptions Disp Refills   potassium chloride SA (KLOR-CON M) 20 MEQ tablet 30 tablet 1    Sig: Take 1 tablet (20 mEq total) by mouth daily.     Endocrinology:  Minerals - Potassium Supplementation Failed - 04/09/2021  7:36 AM      Failed - Cr in normal range and within 360 days    Creatinine  Date Value Ref Range Status  03/06/2014 1.1 0.6 - 1.1 mg/dL Final   Creat  Date Value Ref Range Status  04/23/2014 1.01 0.50 - 1.10 mg/dL Final   Creatinine, Ser  Date Value Ref Range Status  12/28/2020 1.34 (H) 0.44 - 1.00 mg/dL Final         Passed - K in normal range and within 360 days    Potassium  Date Value Ref Range Status  12/28/2020 4.5 3.5 - 5.1 mmol/L Final  03/06/2014 4.0 3.5 - 5.1 mEq/L Final         Passed - Valid encounter within last 12 months    Recent Outpatient Visits          3 months ago Hypertensive heart disease with chronic combined systolic and diastolic congestive heart failure ([place])   [place], Charlane Ferretti, MD   3 months ago Atrial flutter with rapid ventricular response ([place])   [place], Enobong, MD   6 months ago Hoarseness of voice   [place], Enobong, MD   9 months ago Hypokalemia   [place], [place]   2 years ago Rheumatoid factor positive   [place], Charlane Ferretti, MD      Future Appointments            Tomorrow Loel Dubonnet, NP [facility]

## 2021-04-10 ENCOUNTER — Encounter (HOSPITAL_BASED_OUTPATIENT_CLINIC_OR_DEPARTMENT_OTHER): Payer: Self-pay | Admitting: Family

## 2021-04-10 ENCOUNTER — Other Ambulatory Visit: Payer: Self-pay

## 2021-04-10 ENCOUNTER — Other Ambulatory Visit (HOSPITAL_BASED_OUTPATIENT_CLINIC_OR_DEPARTMENT_OTHER): Payer: Self-pay

## 2021-04-10 ENCOUNTER — Ambulatory Visit (INDEPENDENT_AMBULATORY_CARE_PROVIDER_SITE_OTHER): Payer: Medicaid Other | Admitting: Family

## 2021-04-10 VITALS — BP 150/80 | HR 92 | Ht 66.0 in | Wt 179.0 lb

## 2021-04-10 DIAGNOSIS — D6859 Other primary thrombophilia: Secondary | ICD-10-CM

## 2021-04-10 DIAGNOSIS — I10 Essential (primary) hypertension: Secondary | ICD-10-CM

## 2021-04-10 DIAGNOSIS — I4819 Other persistent atrial fibrillation: Secondary | ICD-10-CM

## 2021-04-10 DIAGNOSIS — E876 Hypokalemia: Secondary | ICD-10-CM

## 2021-04-10 MED ORDER — RIVAROXABAN 20 MG PO TABS
20.0000 mg | ORAL_TABLET | Freq: Every day | ORAL | 5 refills | Status: DC
  Filled 2021-05-11 – 2021-05-26 (×2): qty 30, 30d supply, fill #0
  Filled 2021-06-23 – 2021-07-02 (×2): qty 30, 30d supply, fill #1
  Filled 2021-07-25 – 2021-08-06 (×2): qty 30, 30d supply, fill #2
  Filled 2021-08-25 – 2021-09-09 (×2): qty 30, 30d supply, fill #3
  Filled 2021-10-08: qty 30, 30d supply, fill #4
  Filled 2021-11-10: qty 30, 30d supply, fill #5

## 2021-04-10 MED ORDER — METOPROLOL TARTRATE 25 MG PO TABS
25.0000 mg | ORAL_TABLET | Freq: Once | ORAL | Status: AC
  Administered 2021-04-10: 50 mg via ORAL

## 2021-04-10 MED ORDER — POTASSIUM CHLORIDE CRYS ER 20 MEQ PO TBCR
20.0000 meq | EXTENDED_RELEASE_TABLET | Freq: Every day | ORAL | 5 refills | Status: DC
  Filled 2021-04-10 (×2): qty 30, 30d supply, fill #0
  Filled 2021-05-11 – 2021-05-26 (×2): qty 30, 30d supply, fill #1
  Filled 2021-06-23 – 2021-07-02 (×2): qty 30, 30d supply, fill #2
  Filled 2021-07-25 – 2021-08-06 (×2): qty 30, 30d supply, fill #3
  Filled 2021-08-25 – 2021-09-09 (×2): qty 30, 30d supply, fill #4
  Filled 2021-10-08: qty 30, 30d supply, fill #5

## 2021-04-10 MED ORDER — SPIRONOLACTONE 25 MG PO TABS
25.0000 mg | ORAL_TABLET | Freq: Every day | ORAL | 0 refills | Status: DC
  Filled 2021-04-10 (×2): qty 30, 30d supply, fill #0

## 2021-04-10 MED ORDER — LISINOPRIL 10 MG PO TABS
10.0000 mg | ORAL_TABLET | Freq: Every day | ORAL | 5 refills | Status: DC
  Filled 2021-04-10 – 2021-05-26 (×3): qty 30, 30d supply, fill #0
  Filled 2021-06-23 – 2021-07-02 (×2): qty 30, 30d supply, fill #1
  Filled 2021-07-25 – 2021-08-06 (×2): qty 30, 30d supply, fill #2
  Filled 2021-08-25 – 2021-09-09 (×2): qty 30, 30d supply, fill #3
  Filled 2021-10-08: qty 30, 30d supply, fill #4
  Filled 2021-11-10: qty 30, 30d supply, fill #5

## 2021-04-10 MED ORDER — DILTIAZEM HCL ER COATED BEADS 300 MG PO CP24
300.0000 mg | ORAL_CAPSULE | Freq: Every day | ORAL | 5 refills | Status: DC
  Filled 2021-04-10: qty 30, 30d supply, fill #0
  Filled 2021-05-11 – 2021-05-26 (×2): qty 30, 30d supply, fill #1
  Filled 2021-06-23 – 2021-07-02 (×2): qty 30, 30d supply, fill #2
  Filled 2021-07-25 – 2021-08-06 (×2): qty 30, 30d supply, fill #3
  Filled 2021-08-25 – 2021-09-09 (×2): qty 30, 30d supply, fill #4
  Filled 2021-10-08: qty 30, 30d supply, fill #5

## 2021-04-10 MED ORDER — ATORVASTATIN CALCIUM 40 MG PO TABS
40.0000 mg | ORAL_TABLET | Freq: Every day | ORAL | 11 refills | Status: DC
  Filled 2021-05-11 – 2021-05-26 (×2): qty 30, 30d supply, fill #0
  Filled 2021-06-23 – 2021-07-02 (×2): qty 30, 30d supply, fill #1
  Filled 2021-07-25 – 2021-08-06 (×2): qty 30, 30d supply, fill #2
  Filled 2021-08-25 – 2021-09-09 (×2): qty 30, 30d supply, fill #3
  Filled 2021-10-08: qty 30, 30d supply, fill #4
  Filled 2021-11-10: qty 30, 30d supply, fill #5
  Filled 2021-12-12: qty 30, 30d supply, fill #6
  Filled 2022-01-21 – 2022-02-09 (×2): qty 30, 30d supply, fill #7
  Filled 2022-03-19 – 2022-04-01 (×2): qty 30, 30d supply, fill #8

## 2021-04-10 NOTE — Patient Instructions (Addendum)
Medication Instructions:  Your physician has recommended you make the following change in your medication:   RESUME Xarelto 20mg  daily, Spironolactone 25mg  daily, and Diltiazem 300mg  daily  Refills have been sent to your pharmacy. Please pick up TODAY. Please refer to your updated medication list.   It is very important that you did not miss doses of Xarelto prior to your next appointment.  Please pick up your medications at [place] [facility] at [place].  *If you need a refill on your cardiac medications before your next appointment, please call your pharmacy*   Lab Work: Your physician recommends that you return for lab work today: TSH, BNP, CMP, CBC  If you have labs (blood work) drawn today and your tests are completely normal, you will receive your results only by: [place] (if you have MyChart) OR A paper copy in the mail If you have any lab test that is abnormal or we need to change your treatment, we will call you to review the results.   Testing/Procedures: Your EKG today showed atrial fibrillation with a rate of 140 bpm. This is very fast so we are working to adjust your medications to control your heart rate.    Follow-Up: At [facility], you and your health needs are our priority.  As part of our continuing mission to provide you with exceptional heart care, we have created designated Provider Care Teams.  These Care Teams include your primary Cardiologist (physician) and Advanced Practice Providers (APPs -  Physician Assistants and Nurse Practitioners) who all work together to provide you with the care you need, when you need it.  We recommend signing up for the patient portal called "MyChart".  Sign up information is provided on this After Visit Summary.  MyChart is used to connect with patients for Virtual Visits (Telemedicine).  Patients are able to view lab/test results, encounter notes,  upcoming appointments, etc.  Non-urgent messages can be sent to your provider as well.   To learn more about what you can do with MyChart, go to [email or web address].    Your next appointment:   04/24/21 at 10:05 AM with Loel Dubonnet, NP    Other Instructions  Please bring your medication bottles to your next appointment.   To prevent or reduce lower extremity swelling: Eat a low salt diet. Salt makes the body hold onto extra fluid which causes swelling. Sit with legs elevated. For example, in the recliner or on an [place].  Wear knee-high compression stockings during the daytime. Ones labeled 15-20 mmHg provide good compression.

## 2021-04-10 NOTE — Progress Notes (Addendum)
Office Visit    Patient Name: Morgan Bean Date of Encounter: 04/10/2021  PCP:  [name], MD   [place]  Cardiologist:  [name], MD  Advanced Practice Provider:  No care team member to display Electrophysiologist:  None      Chief Complaint    Morgan Bean is a 56 y.o. female with a hx of left atrial appendage thrombus, atrial flutter post ablation with recurrent atrial flutter/fib, cocaine use, anxiety, hypertension CKDIIIa, chronic diastolic heart failure, prior CVA, mild pulmonary hypertension, COPD, OSA not on CPAP, depression presents today for overdue hospital follow up.   Past Medical History    Past Medical History:  Diagnosis Date   Anemia of other chronic disease 11/13/2013   Anginal pain ([place])    none in past year   Anxiety    Asthma    patient denies   Candidiasis 06/23/2013   Chronic diastolic CHF (congestive heart failure) ([name])    Constipation    Coronary artery disease    Lexiscan myoview (10/12) with significant ST depression upon Lexiscan injection but no ischemia or infarction on perfusion images. Left heart cath (10/12): 30% mid LAD, 30% ostial D1, 50% ostial D2.     Cough 11/13/2013   Depression    Headache 02/22/2014   Hx of cardiovascular stress test    Lexiscan Myoview (2/16): Breast attenuation artifact, no ischemia, EF 64%; low risk   Hyperlipemia    Hypertension    Iron deficiency    hx of   Leukocytoclastic vasculitis ([name])    Rash across lower body, occurred in 9/11, diagnosed by skin biopsy. ANA positive. Thought to be secondary to cocaine use.   Leukopenia 03/21/2013   Lymphadenopathy 03/21/2013   Lymphadenopathy 01/30/2014   Mental disorder    Pancytopenia ([place])    Pancytopenia, acquired ([place]) 07/16/2015   Polysubstance abuse ([place])    Prior cocaine   Pulmonary HTN ([place])    Echo (9/11) with EF 65%, mild LVH, mild AI, mild MR, moderate to possibly severe TR with PA systolic pressure 52 mmHg. Echo  (10/12): severe LV hypertrophy, EF 60-65%, mild MR, mild AI, moderate to severe tricuspid regurgitation, PA systolic pressure 38 mmHg.  Echo 4/14: moderate LVH, EF 65%, normal wall motion, diastolic dysfunction, mild AI, mild MR   Shortness of breath    a. PFTs 5/14:  FEV1/FVC 99% predicted; FVC 60% predicted; DLCO mildly reduced, mild restriction, air trapping.;  b.  seen by pulmo (Dr. [name]) 5/14: mainly upper airway symptoms - ACE d/c'd (sx's better)   Stroke [facility]) 2010ish   Syphilis 04/25/2013   Tobacco abuse    Tricuspid regurgitation    Unspecified deficiency anemia 03/29/2013   Past Surgical History:  Procedure Laterality Date   ANKLE SURGERY     right   BONE MARROW ASPIRATION     CARDIAC CATHETERIZATION     CARDIAC CATHETERIZATION N/A 04/23/2015   Procedure: Right Heart Cath;  Surgeon: [name], MD;  Location: [facility];  Service: Cardiovascular;  Laterality: N/A;   CARDIOVERSION N/A 03/11/2018   Procedure: CARDIOVERSION;  Surgeon: [name], MD;  Location: [facility] ENDOSCOPY;  Service: Cardiovascular;  Laterality: N/A;   I & D EXTREMITY  08/09/2011   Procedure: IRRIGATION AND DEBRIDEMENT EXTREMITY;  Surgeon: [name], MD;  Location: [facility];  Service: General;  Laterality: Left;  i & D Left axilla abscess   LYMPH NODE BIOPSY N/A 04/05/2013   Procedure:  EXCISIONAL BIOPSY RIGHT SUBMANDIBULAR NODE, NASAL ENDOSCOPIC WITH BIOPSY NASAL PHARYNX;  Surgeon: [name], MD;  Location: [place];  Service: ENT;  Laterality: N/A;   MULTIPLE EXTRACTIONS WITH ALVEOLOPLASTY N/A 03/03/2019   Procedure: MULTIPLE EXTRACTION WITH ALVEOLOPLASTY;  Surgeon: [name], DDS;  Location: [place];  Service: Oral Surgery;  Laterality: N/A;   TEE WITHOUT CARDIOVERSION N/A 03/11/2018   Procedure: TRANSESOPHAGEAL ECHOCARDIOGRAM (TEE);  Surgeon: [name], MD;  Location: [place];  Service: Cardiovascular;  Laterality: N/A;   TEE WITHOUT CARDIOVERSION N/A 12/27/2020   Procedure:  TRANSESOPHAGEAL ECHOCARDIOGRAM (TEE);  Surgeon: [name], DO;  Location: [facility] ENDOSCOPY;  Service: Cardiovascular;  Laterality: N/A;    Allergies  Allergies  Allergen Reactions   Ciprofloxacin Itching    History of Present Illness    Morgan Bean is a 55 y.o. female with a hx of left atrial appendage thrombus, atrial flutter post ablation with recurrent atrial flutter/fib, cocaine use, anxiety, hypertension CKDIIIa, chronic diastolic heart failure, prior CVA, mild pulmonary hypertension, COPD, OSA not on CPAP, depression last seen while hospitalized.  Admitted 02/2018 with acute on chronic diastolic heart failure and atrial fib/flutter with RVR. Echo with LVEF 50-55%, mild AI, moderate to severe MR. Diltiazem and Xarelto started.   ED visit 04/2018 due to atrial flutter. Cardiac enzymes unremarkable, CXR normal, urine tox positive for amphetamines. She had cardioversion but did not maintain NSR. She was subsequently started on Amiodarone and maintained NSR. Last seen in clinic 12/2018.  She was readmitted 12/2020 with community acquired pneumonia and atrial flutter. Positive for cocaine on admission. TEE showed left atrial appendage thrombus so cardioversion could not be pursued. LVEF 55-60%, RV normal size/function, mild to moderate MR, moderate AI. She was discharged on Xarelto, Amiodarone, Diltiazem with plans to schedule outpatient cardioversion.   Tells me since her admission she has moved in with friends which is a safer environment. Her mother is assisting her by driving her to appointments. When she was on the medication she tells me she felt fine. She has been out of multiple medications for >1 month. Unclear which prescriptions she is taking at home. Per my discussion with her pharmacy she last picked up Xarelto on 02/24/21 for a 30 day supply. Her weight today is up about 16 pounds since discharge. Notes abdominal bloating and pedal edema. She notes shortness of breath when she is  active. No orthopnea nor PND.   EKGs/Labs/Other Studies Reviewed:   The following studies were reviewed today:  TEE 12/27/20  1. Left ventricular ejection fraction, by estimation, is 55 to 60%. The  left ventricle has normal function.   2. Right ventricular systolic function is normal. The right ventricular  size is normal.   3. A left atrial/left atrial appendage thrombus was detected. The LAA  emptying velocity was 22 cm/s.   4. The mitral valve is normal in structure. Mild to moderate mitral valve  regurgitation.   5. The aortic valve is tricuspid. Aortic valve regurgitation is moderate.   Echo 12/25/20  1. Left ventricular ejection fraction, by estimation, is 60 to 65%. The  left ventricle has normal function. The left ventricle has no regional  wall motion abnormalities. There is mild left ventricular hypertrophy.  Left ventricular diastolic function  could not be evaluated.   2. Right ventricular systolic function is normal. The right ventricular  size is normal. There is normal pulmonary artery systolic pressure. The  estimated right ventricular systolic pressure is 00.9 mmHg.   3. Left  atrial size was severely dilated.   4. Right atrial size was severely dilated.   5. The mitral valve is abnormal. Moderate mitral valve regurgitation.  Moderate mitral annular calcification.   6. The tricuspid valve is abnormal. Tricuspid valve regurgitation is  moderate.   7. The aortic valve is tricuspid. Aortic valve regurgitation is moderate.  Mild aortic valve sclerosis is present, with no evidence of aortic valve  stenosis. Aortic regurgitation PHT measures 498 msec.   8. The inferior vena cava is normal in size with <50% respiratory  variability, suggesting right atrial pressure of 8 mmHg.   Comparison(s): Changes from prior study are noted. 01/25/2019: LVEF 60-65%,  mild MR, mild to moderate AI, mild TR.   EKG:  EKG is ordered today.  The ekg ordered today demonstrates atrial  fibrillation 140 bpm with RVR and PVC's.   Recent Labs: 12/17/2020: NT-Pro BNP 2,714 12/25/2020: ALT 32; B Natriuretic Peptide 464.0; TSH 2.038 12/27/2020: Magnesium 2.2 12/28/2020: BUN 19; Creatinine, Ser 1.34; Hemoglobin 11.2; Platelets 183; Potassium 4.5; Sodium 136  Recent Lipid Panel    Component Value Date/Time   CHOL 152 12/26/2020 0232   TRIG 51 12/26/2020 0232   HDL 48 12/26/2020 0232   CHOLHDL 3.2 12/26/2020 0232   VLDL 10 12/26/2020 0232   LDLCALC 94 12/26/2020 0232   LDLDIRECT 142.1 12/25/2010 0846    Risk Assessment/Calculations:   CHA2DS2-VASc Score = 4   This indicates a 4.8% annual risk of stroke. The patient's score is based upon: CHF History: 1 HTN History: 1 Diabetes History: 0 Stroke History: 0 Vascular Disease History: 1 Age Score: 0 Gender Score: 1    Home Medications   Current Meds  Medication Sig   ARIPiprazole (ABILIFY) 5 MG tablet Take 5 mg by mouth daily.   buPROPion (WELLBUTRIN SR) 150 MG 12 hr tablet Take 1 tablet (150 mg total) by mouth 2 (two) times daily.   citalopram (CELEXA) 40 MG tablet Take 1 tablet (40 mg total) by mouth daily.   ferrous sulfate 325 (65 FE) MG EC tablet Take 1 tablet (325 mg total) by mouth 3 (three) times daily with meals.   fluticasone (FLONASE) 50 MCG/ACT nasal spray Administer 2 sprays in each nostril daily.   furosemide (LASIX) 40 MG tablet TAKE 1 TABLET (40 MG TOTAL) BY MOUTH 2 (TWO) TIMES DAILY.   hydrOXYzine (ATARAX) 50 MG tablet Take 1 tablet (50 mg total) by mouth 3 (three) times daily.   omeprazole (PRILOSEC) 40 MG capsule Take 1 capsule (40 mg total) by mouth daily. Take with water, wait 30-60 minutes after taking before eating or drinking anything but water   topiramate (TOPAMAX) 25 MG tablet Take 2 tablets (50 mg total) by mouth at bedtime.   traZODone (DESYREL) 150 MG tablet Take 1 tablet (150 mg total) by mouth at bedtime.   [DISCONTINUED] amiodarone (PACERONE) 200 MG tablet Take 1 tablet (200 mg total)  by mouth daily.   [DISCONTINUED] atorvastatin (LIPITOR) 40 MG tablet Take 1 tablet (40 mg total) by mouth at bedtime.   [DISCONTINUED] lisinopril (ZESTRIL) 10 MG tablet Take 1 tablet (10 mg total) by mouth daily.   [DISCONTINUED] potassium chloride SA (KLOR-CON M) 20 MEQ tablet Take 1 tablet (20 mEq total) by mouth daily.   [DISCONTINUED] rivaroxaban (XARELTO) 20 MG TABS tablet Take 1 tablet (20 mg total) by mouth daily with supper.   [DISCONTINUED] spironolactone (ALDACTONE) 25 MG tablet Take 1 tablet (25 mg total) by mouth daily.  Review of Systems     All other systems reviewed and are otherwise negative except as noted above.  Physical Exam    VS:  BP (!) 150/80 (BP Location: Left Arm, Patient Position: Sitting, Cuff Size: Normal)    Pulse 92    Ht 5\' 6"  (1.676 m)    Wt 179 lb (81.2 kg)    LMP 05/10/2013    SpO2 97%    BMI 28.89 kg/m  , BMI Body mass index is 28.89 kg/m.  Wt Readings from Last 3 Encounters:  04/10/21 179 lb (81.2 kg)  12/28/20 166 lb 10.7 oz (75.6 kg)  12/24/20 162 lb (73.5 kg)     GEN: Well nourished, well developed, in no acute distress. HEENT: normal. Neck: Supple, no JVD, carotid bruits, or masses. Cardiac: tachycardic, irregularly irregular, no  rubs, or gallops. No clubbing, cyanosis, edema. Gr2/6 murmur.  Radials/PT 2+ and equal bilaterally.  Respiratory:  Respirations regular and unlabored, clear to auscultation bilaterally. GI: Soft, nontender, nondistended. MS: No deformity or atrophy. Skin: Warm and dry, no rash. Neuro:  Strength and sensation are intact. Psych: Normal affect.  Assessment & Plan    Atrial flutter / LAA thrombus - Ablative therapy in 2018. Admission 12/2020 with recurrent atrial flutter. Diltiazem dose adjusted and Amiodarone added. Has not taken Diltiazem in at least 1 month. She endorses compliance with Xarelto however pharmacy last filled 30 day supply 02/24/21 which is >1 month ago. Low suspicion she has completed 3 weeks of  uninterrupted therapy and given known LAA thrombus will not schedule cardioversion today. As she has not been taking Amiodarone, is in atrial fibrillation, not completed 3 weeks of [place], will not resume at this time. We have refilled Diltiazem 300mg  QD and Xarelto 20mg  QD. Long discussion regarding importance of regular medications. Initial HR 140 bpm today in clinic and she was provided Metoprolol tartrate 50mg  which improved her HR to 92 bpm. She was instructed to pick her medications up and start today. Prompt follow up in 2 weeks. If she remains in atrial fibrillation an outpatient cardioversion could be scheduled at that time so long as she completes 3 weeks of anticoagulation. Likely will plan for TEE with DCCV to reassess LAA thrombus. CMP, TSH, CBC ordered today.   HTN - BP not at goal in setting of being out of multiple medications. I have asked her to bring her pill bottles to her next appointment. BP goal <130/80. We have provided refills - her antihypertensive regimen should include Spironolactone 27m QD, Lisinopril 10mg  QD, Diltiazem 300mg  QD, Lasix 40mg  QD. If needed in the future, Lisinopril can be up-titrated.  Cocaine use - Complete cessation encouraged.   CKDIIIa - Careful titration of diuretic and antihypertensive.  CMP today.   Diastolic heart failure - Exacerbated by   Disposition: Follow up in 2 week(s) with [name], MD or APP.  Signed, Loel Dubonnet, NP 04/10/2021, 3:19 PM [place]

## 2021-04-13 ENCOUNTER — Encounter (HOSPITAL_COMMUNITY): Payer: Self-pay | Admitting: *Deleted

## 2021-04-13 ENCOUNTER — Emergency Department (HOSPITAL_COMMUNITY): Payer: Medicaid Other

## 2021-04-13 ENCOUNTER — Other Ambulatory Visit: Payer: Self-pay

## 2021-04-13 ENCOUNTER — Inpatient Hospital Stay (HOSPITAL_COMMUNITY)
Admission: EM | Admit: 2021-04-13 | Discharge: 2021-04-17 | DRG: 291 | Disposition: A | Discharge status: Home / Self Care | Payer: Medicaid Other | Attending: Internal Medicine | Admitting: Internal Medicine

## 2021-04-13 DIAGNOSIS — J449 Chronic obstructive pulmonary disease, unspecified: Secondary | ICD-10-CM | POA: Diagnosis present

## 2021-04-13 DIAGNOSIS — Z8249 Family history of ischemic heart disease and other diseases of the circulatory system: Secondary | ICD-10-CM

## 2021-04-13 DIAGNOSIS — I10 Essential (primary) hypertension: Secondary | ICD-10-CM | POA: Diagnosis present

## 2021-04-13 DIAGNOSIS — I272 Pulmonary hypertension, unspecified: Secondary | ICD-10-CM | POA: Diagnosis present

## 2021-04-13 DIAGNOSIS — Z823 Family history of stroke: Secondary | ICD-10-CM

## 2021-04-13 DIAGNOSIS — Z803 Family history of malignant neoplasm of breast: Secondary | ICD-10-CM

## 2021-04-13 DIAGNOSIS — E876 Hypokalemia: Secondary | ICD-10-CM

## 2021-04-13 DIAGNOSIS — I4891 Unspecified atrial fibrillation: Secondary | ICD-10-CM | POA: Diagnosis present

## 2021-04-13 DIAGNOSIS — I5033 Acute on chronic diastolic (congestive) heart failure: Secondary | ICD-10-CM | POA: Diagnosis present

## 2021-04-13 DIAGNOSIS — F339 Major depressive disorder, recurrent, unspecified: Secondary | ICD-10-CM | POA: Diagnosis present

## 2021-04-13 DIAGNOSIS — N1831 Chronic kidney disease, stage 3a: Secondary | ICD-10-CM

## 2021-04-13 DIAGNOSIS — F17210 Nicotine dependence, cigarettes, uncomplicated: Secondary | ICD-10-CM | POA: Diagnosis present

## 2021-04-13 DIAGNOSIS — Z8673 Personal history of transient ischemic attack (TIA), and cerebral infarction without residual deficits: Secondary | ICD-10-CM

## 2021-04-13 DIAGNOSIS — Z7901 Long term (current) use of anticoagulants: Secondary | ICD-10-CM

## 2021-04-13 DIAGNOSIS — I509 Heart failure, unspecified: Secondary | ICD-10-CM

## 2021-04-13 DIAGNOSIS — R4182 Altered mental status, unspecified: Secondary | ICD-10-CM | POA: Diagnosis present

## 2021-04-13 DIAGNOSIS — R0602 Shortness of breath: Secondary | ICD-10-CM

## 2021-04-13 DIAGNOSIS — R0789 Other chest pain: Secondary | ICD-10-CM | POA: Diagnosis present

## 2021-04-13 DIAGNOSIS — Z833 Family history of diabetes mellitus: Secondary | ICD-10-CM

## 2021-04-13 DIAGNOSIS — Z79899 Other long term (current) drug therapy: Secondary | ICD-10-CM

## 2021-04-13 DIAGNOSIS — F32A Depression, unspecified: Secondary | ICD-10-CM | POA: Diagnosis present

## 2021-04-13 DIAGNOSIS — F1410 Cocaine abuse, uncomplicated: Secondary | ICD-10-CM | POA: Diagnosis present

## 2021-04-13 DIAGNOSIS — R519 Headache, unspecified: Secondary | ICD-10-CM | POA: Diagnosis present

## 2021-04-13 DIAGNOSIS — I051 Rheumatic mitral insufficiency: Secondary | ICD-10-CM | POA: Diagnosis present

## 2021-04-13 DIAGNOSIS — U071 COVID-19: Secondary | ICD-10-CM | POA: Diagnosis present

## 2021-04-13 DIAGNOSIS — Z881 Allergy status to other antibiotic agents status: Secondary | ICD-10-CM

## 2021-04-13 DIAGNOSIS — I130 Hypertensive heart and chronic kidney disease with heart failure and stage 1 through stage 4 chronic kidney disease, or unspecified chronic kidney disease: Principal | ICD-10-CM | POA: Diagnosis present

## 2021-04-13 DIAGNOSIS — J4489 Other specified chronic obstructive pulmonary disease: Secondary | ICD-10-CM | POA: Diagnosis present

## 2021-04-13 DIAGNOSIS — E611 Iron deficiency: Secondary | ICD-10-CM | POA: Diagnosis present

## 2021-04-13 DIAGNOSIS — Z825 Family history of asthma and other chronic lower respiratory diseases: Secondary | ICD-10-CM

## 2021-04-13 DIAGNOSIS — I4819 Other persistent atrial fibrillation: Secondary | ICD-10-CM | POA: Diagnosis present

## 2021-04-13 DIAGNOSIS — E785 Hyperlipidemia, unspecified: Secondary | ICD-10-CM | POA: Diagnosis present

## 2021-04-13 DIAGNOSIS — I2510 Atherosclerotic heart disease of native coronary artery without angina pectoris: Secondary | ICD-10-CM | POA: Diagnosis present

## 2021-04-13 LAB — CBC WITH DIFFERENTIAL/PLATELET
Abs Immature Granulocytes: 0 10*3/uL (ref 0.00–0.07)
Basophils Absolute: 0 10*3/uL (ref 0.0–0.1)
Basophils Relative: 1 %
Eosinophils Absolute: 0 10*3/uL (ref 0.0–0.5)
Eosinophils Relative: 0 %
HCT: 37.4 % (ref 36.0–46.0)
Hemoglobin: 12.1 g/dL (ref 12.0–15.0)
Immature Granulocytes: 0 %
Lymphocytes Relative: 18 %
Lymphs Abs: 0.6 10*3/uL — ABNORMAL LOW (ref 0.7–4.0)
MCH: 27.6 pg (ref 26.0–34.0)
MCHC: 32.4 g/dL (ref 30.0–36.0)
MCV: 85.4 fL (ref 80.0–100.0)
Monocytes Absolute: 0.5 10*3/uL (ref 0.1–1.0)
Monocytes Relative: 14 %
Neutro Abs: 2.4 10*3/uL (ref 1.7–7.7)
Neutrophils Relative %: 67 %
Platelets: 143 10*3/uL — ABNORMAL LOW (ref 150–400)
RBC: 4.38 MIL/uL (ref 3.87–5.11)
RDW: 15.5 % (ref 11.5–15.5)
WBC: 3.6 10*3/uL — ABNORMAL LOW (ref 4.0–10.5)
nRBC: 0 % (ref 0.0–0.2)

## 2021-04-13 LAB — TROPONIN I (HIGH SENSITIVITY): Troponin I (High Sensitivity): 21 ng/L — ABNORMAL HIGH (ref <18)

## 2021-04-13 LAB — BRAIN NATRIURETIC PEPTIDE: B Natriuretic Peptide: 1212.6 pg/mL — ABNORMAL HIGH (ref 0.0–100.0)

## 2021-04-13 LAB — ETHANOL: Alcohol, Ethyl (B): <10 mg/dL (ref <10)

## 2021-04-13 LAB — BASIC METABOLIC PANEL
Anion gap: 14 (ref 5–15)
BUN: 13 mg/dL (ref 6–20)
CO2: 21 mmol/L — ABNORMAL LOW (ref 22–32)
Calcium: 9.3 mg/dL (ref 8.9–10.3)
Chloride: 103 mmol/L (ref 98–111)
Creatinine, Ser: 1.24 mg/dL — ABNORMAL HIGH (ref 0.44–1.00)
GFR, Estimated: 51 mL/min — ABNORMAL LOW (ref >60)
Glucose, Bld: 109 mg/dL — ABNORMAL HIGH (ref 70–99)
Potassium: 3.9 mmol/L (ref 3.5–5.1)
Sodium: 138 mmol/L (ref 135–145)

## 2021-04-13 LAB — CBG MONITORING, ED: Glucose-Capillary: 119 mg/dL — ABNORMAL HIGH (ref 70–99)

## 2021-04-13 MED ORDER — DILTIAZEM LOAD VIA INFUSION
10.0000 mg | Freq: Once | INTRAVENOUS | Status: AC
  Administered 2021-04-14: 10 mg via INTRAVENOUS
  Filled 2021-04-13: qty 10

## 2021-04-13 MED ORDER — DILTIAZEM HCL-DEXTROSE 125-5 MG/125ML-% IV SOLN (PREMIX)
5.0000 mg/h | INTRAVENOUS | Status: DC
  Administered 2021-04-14: 5 mg/h via INTRAVENOUS
  Administered 2021-04-14: 15 mg/h via INTRAVENOUS
  Filled 2021-04-13 (×2): qty 125

## 2021-04-13 MED ORDER — FUROSEMIDE 10 MG/ML IJ SOLN
80.0000 mg | Freq: Once | INTRAMUSCULAR | Status: AC
  Administered 2021-04-13: 80 mg via INTRAVENOUS
  Filled 2021-04-13: qty 8

## 2021-04-13 NOTE — ED Provider Notes (Signed)
[facility] EMERGENCY DEPARTMENT Provider Note   CSN: [id number] Arrival date & time: 04/13/21  2038     History  Chief Complaint  Patient presents with   Shortness of Breath    Morgan Bean is a 56 y.o. female.  Patient is a 56 year old female who presents with shortness of breath.  She says it started yesterday and got worse throughout today.  She has a history of atrial fibrillation and is on Xarelto.  She also has a history of cocaine use, hypertension, diastolic heart failure and COPD.  She denies any associated chest pain.  She has had some coughing and feeling of congestion in her chest.  She has had some increased leg swelling.  No known fevers.  She does admit to doing cocaine last night.  She denies any alcohol use.  She does report an associated headache.      Home Medications Prior to Admission medications   Medication Sig Start Date End Date Taking? Authorizing Provider  ARIPiprazole (ABILIFY) 5 MG tablet Take 5 mg by mouth daily.    [provider]  atorvastatin (LIPITOR) 40 MG tablet Take 1 tablet (40 mg total) by mouth at bedtime. 04/10/21   Loel Dubonnet, NP  buPROPion (WELLBUTRIN SR) 150 MG 12 hr tablet Take 1 tablet (150 mg total) by mouth 2 (two) times daily. 12/28/18   [name], MD  citalopram (CELEXA) 40 MG tablet Take 1 tablet (40 mg total) by mouth daily. 12/28/18   [name], MD  diltiazem (CARDIZEM CD) 300 MG 24 hr capsule Take 1 capsule (300 mg total) by mouth daily. 04/10/21 10/07/21  Loel Dubonnet, NP  ferrous sulfate 325 (65 FE) MG EC tablet Take 1 tablet (325 mg total) by mouth 3 (three) times daily with meals. 12/28/18   [name], MD  fluticasone (FLONASE) 50 MCG/ACT nasal spray Administer 2 sprays in each nostril daily. 01/10/21     furosemide (LASIX) 40 MG tablet TAKE 1 TABLET (40 MG TOTAL) BY MOUTH 2 (TWO) TIMES DAILY. 04/19/19   [name], MD  hydrOXYzine (ATARAX) 50 MG tablet Take 1 tablet  (50 mg total) by mouth 3 (three) times daily. 02/19/21   [name], MD  lisinopril (ZESTRIL) 10 MG tablet Take 1 tablet (10 mg total) by mouth daily. 04/10/21 10/07/21  Loel Dubonnet, NP  omeprazole (PRILOSEC) 40 MG capsule Take 1 capsule (40 mg total) by mouth daily. Take with water, wait 30-60 minutes after taking before eating or drinking anything but water 01/10/21     potassium chloride SA (KLOR-CON M) 20 MEQ tablet Take 1 tablet (20 mEq total) by mouth daily. 04/10/21 10/07/21  Loel Dubonnet, NP  rivaroxaban (XARELTO) 20 MG TABS tablet Take 1 tablet (20 mg total) by mouth daily with supper. 04/10/21   Loel Dubonnet, NP  spironolactone (ALDACTONE) 25 MG tablet Take 1 tablet (25 mg total) by mouth daily. 04/10/21 05/10/21  Loel Dubonnet, NP  topiramate (TOPAMAX) 25 MG tablet Take 2 tablets (50 mg total) by mouth at bedtime. 12/28/18   [name], MD  traZODone (DESYREL) 150 MG tablet Take 1 tablet (150 mg total) by mouth at bedtime. 12/28/18   [name], MD      Allergies    Ciprofloxacin    Review of Systems   Review of Systems  Constitutional:  Positive for fatigue. Negative for chills, diaphoresis and fever.  HENT:  Negative for congestion, rhinorrhea and sneezing.   Eyes: Negative.  Respiratory:  Positive for cough and shortness of breath. Negative for chest tightness.   Cardiovascular:  Negative for chest pain and leg swelling.  Gastrointestinal:  Negative for abdominal pain, blood in stool, diarrhea, nausea and vomiting.  Genitourinary:  Negative for difficulty urinating, flank pain, frequency and hematuria.  Musculoskeletal:  Negative for arthralgias and back pain.  Skin:  Negative for rash.  Neurological:  Positive for headaches. Negative for dizziness, speech difficulty, weakness and numbness.   Physical Exam Updated Vital Signs BP (!) 158/133 (BP Location: Left Arm)    Pulse (!) 115    Temp 98.6 F (37 C)    Resp (!) 21    Ht 5\' 6"  (1.676 m)    Wt  81.2 kg    LMP 05/10/2013    SpO2 94%    BMI 28.89 kg/m  Physical Exam Constitutional:      Appearance: She is well-developed.  HENT:     Head: Normocephalic and atraumatic.  Eyes:     Pupils: Pupils are equal, round, and reactive to light.  Cardiovascular:     Rate and Rhythm: Normal rate and regular rhythm.     Heart sounds: Normal heart sounds.  Pulmonary:     Effort: Pulmonary effort is normal. No respiratory distress.     Breath sounds: Rales (Few crackles in the bases bilaterally) present. No wheezing.     Comments: Intermittent tachypnea Chest:     Chest wall: No tenderness.  Abdominal:     General: Bowel sounds are normal.     Palpations: Abdomen is soft.     Tenderness: There is no abdominal tenderness. There is no guarding or rebound.  Musculoskeletal:        General: Normal range of motion.     Cervical back: Normal range of motion and neck supple.     Right lower leg: Edema present.     Left lower leg: Edema present.     Comments: 2+ pitting edema to lower extremities bilaterally  Lymphadenopathy:     Cervical: No cervical adenopathy.  Skin:    General: Skin is warm and dry.     Findings: No rash.  Neurological:     Mental Status: She is alert and oriented to person, place, and time.    ED Results / Procedures / Treatments   Labs (all labs ordered are listed, but only abnormal results are displayed) Labs Reviewed  BASIC METABOLIC PANEL - Abnormal; Notable for the following components:      Result Value   CO2 21 (*)    Glucose, Bld 109 (*)    Creatinine, Ser 1.24 (*)    GFR, Estimated 51 (*)    All other components within normal limits  CBC WITH DIFFERENTIAL/PLATELET - Abnormal; Notable for the following components:   WBC 3.6 (*)    Platelets 143 (*)    Lymphs Abs 0.6 (*)    All other components within normal limits  CBG MONITORING, ED - Abnormal; Notable for the following components:   Glucose-Capillary 119 (*)    All other components within normal  limits  TROPONIN I (HIGH SENSITIVITY) - Abnormal; Notable for the following components:   Troponin I (High Sensitivity) 21 (*)    All other components within normal limits  RESP PANEL BY RT-PCR (FLU A&B, COVID) ARPGX2  ETHANOL  BRAIN NATRIURETIC PEPTIDE  RAPID URINE DRUG SCREEN, HOSP PERFORMED  URINALYSIS, ROUTINE W REFLEX MICROSCOPIC  TROPONIN I (HIGH SENSITIVITY)    EKG EKG Interpretation  Date/Time:  Sunday April 13 2021 20:53:37 EST Ventricular Rate:  126 PR Interval:    QRS Duration: 88 QT Interval:  340 QTC Calculation: 492 R Axis:   32 Text Interpretation: Atrial flutter with variable A-V block Left ventricular hypertrophy with repolarization abnormality ( Sokolow-Lyon ) Abnormal ECG When compared with ECG of 27-Dec-2020 18:15, PREVIOUS ECG IS PRESENT Confirmed by Malvin Johns [phone]) on 04/13/2021 9:24:37 PM  Radiology DG Chest 2 View  Result Date: 04/13/2021 CLINICAL DATA:  Shortness of breath, chest pain EXAM: CHEST - 2 VIEW COMPARISON:  12/26/2020 FINDINGS: Cardiomegaly, vascular congestion. Interstitial prominence likely reflects interstitial edema. No effusions or acute bony abnormality. IMPRESSION: Cardiomegaly, vascular congestion, mild interstitial edema. Electronically Signed   By: Rolm Baptise M.D.   On: 04/13/2021 21:30    Procedures Procedures    Medications Ordered in ED Medications  diltiazem (CARDIZEM) 1 mg/mL load via infusion 10 mg (has no administration in time range)    And  diltiazem (CARDIZEM) 125 mg in dextrose 5% 125 mL (1 mg/mL) infusion (has no administration in time range)  furosemide (LASIX) injection 80 mg (80 mg Intravenous Given 04/13/21 2243)    ED Course/ Medical Decision Making/ A&P                           Medical Decision Making Amount and/or Complexity of Data Reviewed Labs: ordered. Radiology: ordered.  Risk Prescription drug management.   Patient is a 56 year old female who presents primarily with shortness of  breath.  She initially had no hypoxia.  She does have some suggestions of fluid overload with crackles in her lungs on exam.  Her chest x-ray which was interpreted by me shows some evidence of vascular congestion.  Her EKG shows A. fib with RVR.  No ischemic changes are noted.  Her troponin is mildly elevated but similar to prior values.  Her creatinine is mildly elevated but similar to prior values.  She was initially given IV Lasix and has diuresed well.  She reports that her breathing has improved.  However she still has A. fib with RVR with heart rates in the 130s.  We will start Cardizem.  She was given a bolus and a drip.  She does have some change in her mental status.  She is somnolent at times and is having some Cheyne-Stokes type respirations.  She will wake up and answer questions appropriately.  She does not have  focal neurologic deficits.  She does report a headache.  She is on Xarelto.  We will order a head CT.  She was mildly hypoxic and was placed on nasal cannula oxygen.  She seems maintaining normal oxygen saturations on this.  Patient care turned over to Dr. Roxanne Mins pending the head CT and remainder of labs.  Patient will need admission for further treatment.  Final Clinical Impression(s) / ED Diagnoses Final diagnoses:  Acute on chronic congestive heart failure, unspecified heart failure type ([place])  Altered mental status, unspecified altered mental status type    Rx / DC Orders ED Discharge Orders     None         Malvin Johns, MD 04/13/21 [phone]

## 2021-04-13 NOTE — ED Triage Notes (Signed)
Headache for 2 days

## 2021-04-13 NOTE — ED Triage Notes (Signed)
The pt is hyperventilating she has had difficulty breathing since last pm.  She denies  any temp  she is a smoker   does not use  02 at home

## 2021-04-13 NOTE — ED Provider Triage Note (Signed)
Emergency Medicine Provider Triage Evaluation Note  Morgan Bean , a 56 y.o. female  was evaluated in triage.  Pt complains of shortness of breath onset last night.  Patient is a current cigarette smoker.  She does not use oxygen at home.  Denies sick contacts..  Has not tried medications for symptoms.  Has associated mid chest pain.  Denies nausea vomiting, fever, chills.  Review of Systems  Positive: As per HPI above. Negative: Nausea, vomiting  Physical Exam  BP (!) 158/133 (BP Location: Left Arm)    Pulse (!) 142    Temp 98.6 F (37 C)    Resp (!) 26    Ht 5\' 6"  (1.676 m)    Wt 81.2 kg    LMP 05/10/2013    SpO2 93%    BMI 28.89 kg/m  Gen:   Awake, acute distress, hyperventilating upon exam. Resp:  Normal effort  MSK:   Moves extremities without difficulty  Other:  No chest wall tenderness palpation.  Medical Decision Making  Medically screening exam initiated at 9:03 PM.  Appropriate orders placed.  Morgan Bean was informed that the remainder of the evaluation will be completed by another provider, this initial triage assessment does not replace that evaluation, and the importance of remaining in the ED until their evaluation is complete.  9:04 PM - Discussed with RN that patient is in need of a room immediately. RN aware and working on room placement.    Morgan Bean A, PA-C 04/13/21 2106

## 2021-04-13 NOTE — ED Provider Notes (Signed)
Care assumed from Dr. Tamera Punt, patient with shortness of breath, chest x-ray consistent with heart failure exacerbation.  CT of head has been ordered because of headache and altered mental status in a patient who is on anticoagulants.  Also hypertensive and tachycardic.  Will likely need admission.  Currently BNP and CT of head are pending.  BNP is significantly elevated.  CT of head shows no acute process.  Images were independently viewed by myself, and I agree with radiologist's interpretation.  Case is discussed with Dr. Alcario Drought of [facility], who agrees to admit the patient.   Delora Fuel, MD 61/95/09 [phone]

## 2021-04-14 ENCOUNTER — Observation Stay (HOSPITAL_COMMUNITY): Payer: Medicaid Other

## 2021-04-14 ENCOUNTER — Emergency Department (HOSPITAL_COMMUNITY): Payer: Medicaid Other

## 2021-04-14 DIAGNOSIS — E876 Hypokalemia: Secondary | ICD-10-CM | POA: Diagnosis present

## 2021-04-14 DIAGNOSIS — I4891 Unspecified atrial fibrillation: Secondary | ICD-10-CM

## 2021-04-14 DIAGNOSIS — I051 Rheumatic mitral insufficiency: Secondary | ICD-10-CM | POA: Diagnosis present

## 2021-04-14 DIAGNOSIS — R4182 Altered mental status, unspecified: Secondary | ICD-10-CM

## 2021-04-14 DIAGNOSIS — I509 Heart failure, unspecified: Secondary | ICD-10-CM

## 2021-04-14 DIAGNOSIS — U071 COVID-19: Secondary | ICD-10-CM

## 2021-04-14 DIAGNOSIS — E785 Hyperlipidemia, unspecified: Secondary | ICD-10-CM | POA: Diagnosis present

## 2021-04-14 DIAGNOSIS — Z881 Allergy status to other antibiotic agents status: Secondary | ICD-10-CM | POA: Diagnosis not present

## 2021-04-14 DIAGNOSIS — I4819 Other persistent atrial fibrillation: Secondary | ICD-10-CM | POA: Diagnosis present

## 2021-04-14 DIAGNOSIS — N1831 Chronic kidney disease, stage 3a: Secondary | ICD-10-CM

## 2021-04-14 DIAGNOSIS — I130 Hypertensive heart and chronic kidney disease with heart failure and stage 1 through stage 4 chronic kidney disease, or unspecified chronic kidney disease: Secondary | ICD-10-CM | POA: Diagnosis present

## 2021-04-14 DIAGNOSIS — E611 Iron deficiency: Secondary | ICD-10-CM | POA: Diagnosis present

## 2021-04-14 DIAGNOSIS — Z8673 Personal history of transient ischemic attack (TIA), and cerebral infarction without residual deficits: Secondary | ICD-10-CM | POA: Diagnosis not present

## 2021-04-14 DIAGNOSIS — I272 Pulmonary hypertension, unspecified: Secondary | ICD-10-CM | POA: Diagnosis present

## 2021-04-14 DIAGNOSIS — J449 Chronic obstructive pulmonary disease, unspecified: Secondary | ICD-10-CM | POA: Diagnosis present

## 2021-04-14 DIAGNOSIS — Z823 Family history of stroke: Secondary | ICD-10-CM | POA: Diagnosis not present

## 2021-04-14 DIAGNOSIS — I10 Essential (primary) hypertension: Secondary | ICD-10-CM

## 2021-04-14 DIAGNOSIS — I5033 Acute on chronic diastolic (congestive) heart failure: Secondary | ICD-10-CM | POA: Diagnosis present

## 2021-04-14 DIAGNOSIS — I5031 Acute diastolic (congestive) heart failure: Secondary | ICD-10-CM

## 2021-04-14 DIAGNOSIS — F32A Depression, unspecified: Secondary | ICD-10-CM | POA: Diagnosis present

## 2021-04-14 DIAGNOSIS — F1410 Cocaine abuse, uncomplicated: Secondary | ICD-10-CM

## 2021-04-14 DIAGNOSIS — I2510 Atherosclerotic heart disease of native coronary artery without angina pectoris: Secondary | ICD-10-CM | POA: Diagnosis present

## 2021-04-14 DIAGNOSIS — R0602 Shortness of breath: Secondary | ICD-10-CM | POA: Diagnosis present

## 2021-04-14 DIAGNOSIS — R0789 Other chest pain: Secondary | ICD-10-CM | POA: Diagnosis present

## 2021-04-14 DIAGNOSIS — Z7901 Long term (current) use of anticoagulants: Secondary | ICD-10-CM | POA: Diagnosis not present

## 2021-04-14 DIAGNOSIS — F17210 Nicotine dependence, cigarettes, uncomplicated: Secondary | ICD-10-CM | POA: Diagnosis present

## 2021-04-14 DIAGNOSIS — R519 Headache, unspecified: Secondary | ICD-10-CM | POA: Diagnosis present

## 2021-04-14 DIAGNOSIS — Z8249 Family history of ischemic heart disease and other diseases of the circulatory system: Secondary | ICD-10-CM | POA: Diagnosis not present

## 2021-04-14 DIAGNOSIS — Z79899 Other long term (current) drug therapy: Secondary | ICD-10-CM | POA: Diagnosis not present

## 2021-04-14 LAB — CBC WITH DIFFERENTIAL/PLATELET
Abs Immature Granulocytes: 0.02 10*3/uL (ref 0.00–0.07)
Basophils Absolute: 0 10*3/uL (ref 0.0–0.1)
Basophils Relative: 1 %
Eosinophils Absolute: 0 10*3/uL (ref 0.0–0.5)
Eosinophils Relative: 0 %
HCT: 37.6 % (ref 36.0–46.0)
Hemoglobin: 12 g/dL (ref 12.0–15.0)
Immature Granulocytes: 1 %
Lymphocytes Relative: 23 %
Lymphs Abs: 0.7 10*3/uL (ref 0.7–4.0)
MCH: 27.1 pg (ref 26.0–34.0)
MCHC: 31.9 g/dL (ref 30.0–36.0)
MCV: 85.1 fL (ref 80.0–100.0)
Monocytes Absolute: 0.4 10*3/uL (ref 0.1–1.0)
Monocytes Relative: 14 %
Neutro Abs: 1.8 10*3/uL (ref 1.7–7.7)
Neutrophils Relative %: 61 %
Platelets: 144 10*3/uL — ABNORMAL LOW (ref 150–400)
RBC: 4.42 MIL/uL (ref 3.87–5.11)
RDW: 15.5 % (ref 11.5–15.5)
WBC: 3 10*3/uL — ABNORMAL LOW (ref 4.0–10.5)
nRBC: 0 % (ref 0.0–0.2)

## 2021-04-14 LAB — RAPID URINE DRUG SCREEN, HOSP PERFORMED
Amphetamines: NOT DETECTED
Barbiturates: NOT DETECTED
Benzodiazepines: NOT DETECTED
Cocaine: POSITIVE — AB
Opiates: NOT DETECTED
Tetrahydrocannabinol: NOT DETECTED

## 2021-04-14 LAB — ECHOCARDIOGRAM COMPLETE
AR max vel: 0.87 cm2
AV Area VTI: 0.76 cm2
AV Area mean vel: 0.79 cm2
AV Mean grad: 9 mmHg
AV Peak grad: 17 mmHg
Ao pk vel: 2.06 m/s
Calc EF: 42.1 %
Height: 66 in
MV VTI: 0.15 cm2
P 1/2 time: 1080 msec
S' Lateral: 3 cm
Single Plane A2C EF: 40.4 %
Single Plane A4C EF: 41.3 %
Weight: 2864.22 oz

## 2021-04-14 LAB — RESP PANEL BY RT-PCR (FLU A&B, COVID) ARPGX2
Influenza A by PCR: NEGATIVE
Influenza B by PCR: NEGATIVE
SARS Coronavirus 2 by RT PCR: POSITIVE — AB

## 2021-04-14 LAB — URINALYSIS, ROUTINE W REFLEX MICROSCOPIC
Bilirubin Urine: NEGATIVE
Glucose, UA: NEGATIVE mg/dL
Ketones, ur: NEGATIVE mg/dL
Leukocytes,Ua: NEGATIVE
Nitrite: NEGATIVE
Protein, ur: NEGATIVE mg/dL
Specific Gravity, Urine: 1.005 (ref 1.005–1.030)
pH: 7 (ref 5.0–8.0)

## 2021-04-14 LAB — COMPREHENSIVE METABOLIC PANEL
ALT: 46 U/L — ABNORMAL HIGH (ref 0–44)
AST: 49 U/L — ABNORMAL HIGH (ref 15–41)
Albumin: 3.9 g/dL (ref 3.5–5.0)
Alkaline Phosphatase: 80 U/L (ref 38–126)
Anion gap: 9 (ref 5–15)
BUN: 15 mg/dL (ref 6–20)
CO2: 21 mmol/L — ABNORMAL LOW (ref 22–32)
Calcium: 8.8 mg/dL — ABNORMAL LOW (ref 8.9–10.3)
Chloride: 105 mmol/L (ref 98–111)
Creatinine, Ser: 1.21 mg/dL — ABNORMAL HIGH (ref 0.44–1.00)
GFR, Estimated: 52 mL/min — ABNORMAL LOW (ref >60)
Glucose, Bld: 113 mg/dL — ABNORMAL HIGH (ref 70–99)
Potassium: 3.4 mmol/L — ABNORMAL LOW (ref 3.5–5.1)
Sodium: 135 mmol/L (ref 135–145)
Total Bilirubin: 1.3 mg/dL — ABNORMAL HIGH (ref 0.3–1.2)
Total Protein: 7.6 g/dL (ref 6.5–8.1)

## 2021-04-14 LAB — C-REACTIVE PROTEIN: CRP: 1.8 mg/dL — ABNORMAL HIGH (ref <1.0)

## 2021-04-14 LAB — D-DIMER, QUANTITATIVE: D-Dimer, Quant: 1.21 ug/mL-FEU — ABNORMAL HIGH (ref 0.00–0.50)

## 2021-04-14 MED ORDER — CLONIDINE HCL 0.1 MG PO TABS
0.1000 mg | ORAL_TABLET | Freq: Once | ORAL | Status: AC
  Administered 2021-04-14: 0.1 mg via ORAL
  Filled 2021-04-14: qty 1

## 2021-04-14 MED ORDER — BUDESONIDE 0.25 MG/2ML IN SUSP
0.2500 mg | Freq: Two times a day (BID) | RESPIRATORY_TRACT | Status: DC
  Administered 2021-04-14 – 2021-04-17 (×7): 0.25 mg via RESPIRATORY_TRACT
  Filled 2021-04-14 (×7): qty 2

## 2021-04-14 MED ORDER — MOLNUPIRAVIR EUA 200MG CAPSULE
4.0000 | ORAL_CAPSULE | Freq: Two times a day (BID) | ORAL | Status: DC
  Administered 2021-04-14 – 2021-04-17 (×7): 800 mg via ORAL
  Filled 2021-04-14: qty 4

## 2021-04-14 MED ORDER — FLUTICASONE PROPIONATE 50 MCG/ACT NA SUSP
2.0000 | Freq: Every day | NASAL | Status: DC
  Administered 2021-04-14 – 2021-04-17 (×4): 2 via NASAL
  Filled 2021-04-14: qty 16

## 2021-04-14 MED ORDER — UMECLIDINIUM BROMIDE 62.5 MCG/ACT IN AEPB
1.0000 | INHALATION_SPRAY | Freq: Every day | RESPIRATORY_TRACT | Status: DC
  Administered 2021-04-14 – 2021-04-17 (×4): 1 via RESPIRATORY_TRACT
  Filled 2021-04-14: qty 7

## 2021-04-14 MED ORDER — CLONIDINE HCL 0.1 MG PO TABS: 0.1000 mg | ORAL_TABLET | Freq: Two times a day (BID) | ORAL | Status: DC

## 2021-04-14 MED ORDER — FUROSEMIDE 10 MG/ML IJ SOLN
40.0000 mg | Freq: Every day | INTRAMUSCULAR | Status: DC
  Administered 2021-04-14: 40 mg via INTRAVENOUS
  Filled 2021-04-14: qty 4

## 2021-04-14 MED ORDER — PANTOPRAZOLE SODIUM 40 MG PO TBEC
80.0000 mg | DELAYED_RELEASE_TABLET | Freq: Every day | ORAL | Status: DC
  Administered 2021-04-14 – 2021-04-17 (×4): 80 mg via ORAL
  Filled 2021-04-14 (×4): qty 2

## 2021-04-14 MED ORDER — HYDROXYZINE HCL 25 MG PO TABS
50.0000 mg | ORAL_TABLET | Freq: Three times a day (TID) | ORAL | Status: DC
  Administered 2021-04-14 – 2021-04-17 (×10): 50 mg via ORAL
  Filled 2021-04-14 (×10): qty 2

## 2021-04-14 MED ORDER — RIVAROXABAN 20 MG PO TABS
20.0000 mg | ORAL_TABLET | Freq: Every day | ORAL | Status: DC
  Administered 2021-04-14 – 2021-04-16 (×3): 20 mg via ORAL
  Filled 2021-04-14 (×3): qty 1

## 2021-04-14 MED ORDER — LEVALBUTEROL HCL 0.63 MG/3ML IN NEBU: 0.6300 mg | INHALATION_SOLUTION | Freq: Four times a day (QID) | RESPIRATORY_TRACT | Status: DC | PRN

## 2021-04-14 MED ORDER — LISINOPRIL 10 MG PO TABS
10.0000 mg | ORAL_TABLET | Freq: Every day | ORAL | Status: DC
  Administered 2021-04-14 – 2021-04-17 (×4): 10 mg via ORAL
  Filled 2021-04-14 (×4): qty 1

## 2021-04-14 MED ORDER — POTASSIUM CHLORIDE CRYS ER 20 MEQ PO TBCR
20.0000 meq | EXTENDED_RELEASE_TABLET | Freq: Every day | ORAL | Status: DC
  Administered 2021-04-14: 20 meq via ORAL
  Filled 2021-04-14: qty 1

## 2021-04-14 MED ORDER — ACETAMINOPHEN 325 MG PO TABS: 650.0000 mg | ORAL_TABLET | ORAL | Status: DC | PRN

## 2021-04-14 MED ORDER — DILTIAZEM HCL ER COATED BEADS 300 MG PO CP24: 300.0000 mg | ORAL_CAPSULE | Freq: Every day | ORAL | Status: DC

## 2021-04-14 MED ORDER — ONDANSETRON HCL 4 MG/2ML IJ SOLN: 4.0000 mg | Freq: Four times a day (QID) | INTRAMUSCULAR | Status: DC | PRN

## 2021-04-14 MED ORDER — DILTIAZEM HCL 60 MG PO TABS
60.0000 mg | ORAL_TABLET | Freq: Four times a day (QID) | ORAL | Status: DC
  Administered 2021-04-14 – 2021-04-17 (×12): 60 mg via ORAL
  Filled 2021-04-14 (×14): qty 1

## 2021-04-14 MED ORDER — ATORVASTATIN CALCIUM 40 MG PO TABS
40.0000 mg | ORAL_TABLET | Freq: Every day | ORAL | Status: DC
  Administered 2021-04-14 – 2021-04-16 (×3): 40 mg via ORAL
  Filled 2021-04-14 (×3): qty 1

## 2021-04-14 MED ORDER — ARFORMOTEROL TARTRATE 15 MCG/2ML IN NEBU
15.0000 ug | INHALATION_SOLUTION | Freq: Two times a day (BID) | RESPIRATORY_TRACT | Status: DC
  Administered 2021-04-14 – 2021-04-17 (×7): 15 ug via RESPIRATORY_TRACT
  Filled 2021-04-14 (×7): qty 2

## 2021-04-14 MED ORDER — POTASSIUM CHLORIDE CRYS ER 20 MEQ PO TBCR
40.0000 meq | EXTENDED_RELEASE_TABLET | Freq: Every day | ORAL | Status: DC
  Administered 2021-04-15 – 2021-04-17 (×3): 40 meq via ORAL
  Filled 2021-04-14 (×3): qty 2

## 2021-04-14 MED ORDER — SPIRONOLACTONE 25 MG PO TABS
25.0000 mg | ORAL_TABLET | Freq: Every day | ORAL | Status: DC
  Administered 2021-04-14: 25 mg via ORAL
  Filled 2021-04-14 (×3): qty 1

## 2021-04-14 MED ORDER — DILTIAZEM HCL 60 MG PO TABS: 60.0000 mg | ORAL_TABLET | Freq: Four times a day (QID) | ORAL | Status: DC

## 2021-04-14 NOTE — Assessment & Plan Note (Addendum)
Creatinine not far from baseline.

## 2021-04-14 NOTE — Hospital Course (Addendum)
56 year old with PAF, HFpEF, HTN, cocaine use-who presented with shortness of breath-patient was found to have A. fib with RVR and HFpEF exacerbation in the setting of ongoing cocaine use.  She was also found to have COVID-19 infection.  Significant events: 1/22>> admit for shortness of breath-after cocaine use-found to have A. fib RVR and HFpEF exacerbation.  Pertinent imaging studies: 1/22>> CXR: Vascular congestion/interstitial edema. 1/22>> CT head: No acute intracranial abnormality. 1/23>> Echo: EF 60-65%.

## 2021-04-14 NOTE — Assessment & Plan Note (Addendum)
Significantly better-treated with IV furosemide.  She is back to baseline-this morning she feels that she is now much better and wants to go home.  Has been transitioned to oral furosemide/Aldactone.  Echo on 1/23 with preserved EF of 60-65%.

## 2021-04-14 NOTE — Assessment & Plan Note (Addendum)
Repleted. °

## 2021-04-14 NOTE — Assessment & Plan Note (Signed)
Due to cocaine + A.Fib RVR.  Also possibly COVID.

## 2021-04-14 NOTE — Assessment & Plan Note (Addendum)
Continue monotherapy x5 days total-remained stable on room air.  SOB on presentation was more likely from HFpEF exacerbation.

## 2021-04-14 NOTE — H&P (Signed)
History and Physical    Patient: Morgan Bean QIO:[id number] DOB: [birth year] DOA: 04/13/2021 DOS: the patient was seen and examined on 04/14/2021 PCP: [name], MD  Patient coming from: Home  Chief Complaint: SOB  HPI: Morgan Bean is a 56 y.o. female with medical history significant of dCHF, mod MVR, ongoing cocaine abuse, HTN.  Pt admits to doing cocaine last evening.  Pt presents to ED with c/o SOB, onset yesterday, got worse throughout today.  No CP.  Does have some coughing and feeling of congestion.  Does have headache.  Denies EtOH use.  Review of Systems: As mentioned in the history of present illness. All other systems reviewed and are negative. Past Medical History:  Diagnosis Date   Anemia of other chronic disease 11/13/2013   Anginal pain ([place])    none in past year   Anxiety    Asthma    patient denies   Candidiasis 06/23/2013   Chronic diastolic CHF (congestive heart failure) ([name])    Constipation    Coronary artery disease    Lexiscan myoview (10/12) with significant ST depression upon Lexiscan injection but no ischemia or infarction on perfusion images. Left heart cath (10/12): 30% mid LAD, 30% ostial D1, 50% ostial D2.     Cough 11/13/2013   Depression    Headache 02/22/2014   Hx of cardiovascular stress test    Lexiscan Myoview (2/16): Breast attenuation artifact, no ischemia, EF 64%; low risk   Hyperlipemia    Hypertension    Iron deficiency    hx of   Leukocytoclastic vasculitis ([name])    Rash across lower body, occurred in 9/11, diagnosed by skin biopsy. ANA positive. Thought to be secondary to cocaine use.   Leukopenia 03/21/2013   Lymphadenopathy 03/21/2013   Lymphadenopathy 01/30/2014   Mental disorder    Pancytopenia ([place])    Pancytopenia, acquired ([place]) 07/16/2015   Polysubstance abuse ([place])    Prior cocaine   Pulmonary HTN ([place])    Echo (9/11) with EF 65%, mild LVH, mild AI, mild MR, moderate to possibly severe TR with PA systolic pressure 52  mmHg. Echo (10/12): severe LV hypertrophy, EF 60-65%, mild MR, mild AI, moderate to severe tricuspid regurgitation, PA systolic pressure 38 mmHg.  Echo 4/14: moderate LVH, EF 65%, normal wall motion, diastolic dysfunction, mild AI, mild MR   Shortness of breath    a. PFTs 5/14:  FEV1/FVC 99% predicted; FVC 60% predicted; DLCO mildly reduced, mild restriction, air trapping.;  b.  seen by pulmo (Dr. [name]) 5/14: mainly upper airway symptoms - ACE d/c'd (sx's better)   Stroke [facility]) 2010ish   Syphilis 04/25/2013   Tobacco abuse    Tricuspid regurgitation    Unspecified deficiency anemia 03/29/2013   Past Surgical History:  Procedure Laterality Date   ANKLE SURGERY     right   BONE MARROW ASPIRATION     CARDIAC CATHETERIZATION     CARDIAC CATHETERIZATION N/A 04/23/2015   Procedure: Right Heart Cath;  Surgeon: [name], MD;  Location: [facility];  Service: Cardiovascular;  Laterality: N/A;   CARDIOVERSION N/A 03/11/2018   Procedure: CARDIOVERSION;  Surgeon: [name], MD;  Location: [facility];  Service: Cardiovascular;  Laterality: N/A;   I & D EXTREMITY  08/09/2011   Procedure: IRRIGATION AND DEBRIDEMENT EXTREMITY;  Surgeon: [name], MD;  Location: [place];  Service: General;  Laterality: Left;  i & D Left axilla abscess   LYMPH NODE BIOPSY N/A 04/05/2013  Procedure: EXCISIONAL BIOPSY RIGHT SUBMANDIBULAR NODE, NASAL ENDOSCOPIC WITH BIOPSY NASAL PHARYNX;  Surgeon: [name], MD;  Location: [place];  Service: ENT;  Laterality: N/A;   MULTIPLE EXTRACTIONS WITH ALVEOLOPLASTY N/A 03/03/2019   Procedure: MULTIPLE EXTRACTION WITH ALVEOLOPLASTY;  Surgeon: [name], DDS;  Location: [place];  Service: Oral Surgery;  Laterality: N/A;   TEE WITHOUT CARDIOVERSION N/A 03/11/2018   Procedure: TRANSESOPHAGEAL ECHOCARDIOGRAM (TEE);  Surgeon: [name], MD;  Location: [place];  Service: Cardiovascular;  Laterality: N/A;   TEE WITHOUT CARDIOVERSION N/A 12/27/2020   Procedure:  TRANSESOPHAGEAL ECHOCARDIOGRAM (TEE);  Surgeon: [name], DO;  Location: [facility];  Service: Cardiovascular;  Laterality: N/A;   Social History:  reports that she has been smoking cigarettes. She has a 19.50 pack-year smoking history. She has never used smokeless tobacco. She reports that she does not currently use alcohol. She reports current drug use. Frequency: 7.00 times per week. Drug: "Crack" cocaine.  Allergies  Allergen Reactions   Ciprofloxacin Itching    Family History  Problem Relation Age of Onset   Coronary artery disease Mother    Diabetes Mother    Diabetes Father    Hypertension Father    Coronary artery disease Father    Heart attack Father    Breast cancer Maternal Grandmother    Diabetes Maternal Grandmother    Heart attack Maternal Grandmother    Stroke Maternal Grandmother    Asthma Grandson     Prior to Admission medications   Medication Sig Start Date End Date Taking? Authorizing Provider  ARIPiprazole (ABILIFY) 5 MG tablet Take 5 mg by mouth daily.    [provider]  atorvastatin (LIPITOR) 40 MG tablet Take 1 tablet (40 mg total) by mouth at bedtime. 04/10/21   Loel Dubonnet, NP  buPROPion (WELLBUTRIN SR) 150 MG 12 hr tablet Take 1 tablet (150 mg total) by mouth 2 (two) times daily. 12/28/18   [name], MD  citalopram (CELEXA) 40 MG tablet Take 1 tablet (40 mg total) by mouth daily. 12/28/18   [name], MD  diltiazem (CARDIZEM CD) 300 MG 24 hr capsule Take 1 capsule (300 mg total) by mouth daily. 04/10/21 10/07/21  Loel Dubonnet, NP  ferrous sulfate 325 (65 FE) MG EC tablet Take 1 tablet (325 mg total) by mouth 3 (three) times daily with meals. 12/28/18   [name], MD  fluticasone (FLONASE) 50 MCG/ACT nasal spray Administer 2 sprays in each nostril daily. 01/10/21     furosemide (LASIX) 40 MG tablet TAKE 1 TABLET (40 MG TOTAL) BY MOUTH 2 (TWO) TIMES DAILY. 04/19/19   [name], MD  hydrOXYzine (ATARAX) 50 MG  tablet Take 1 tablet (50 mg total) by mouth 3 (three) times daily. 02/19/21   [name], MD  lisinopril (ZESTRIL) 10 MG tablet Take 1 tablet (10 mg total) by mouth daily. 04/10/21 10/07/21  Loel Dubonnet, NP  omeprazole (PRILOSEC) 40 MG capsule Take 1 capsule (40 mg total) by mouth daily. Take with water, wait 30-60 minutes after taking before eating or drinking anything but water 01/10/21     potassium chloride SA (KLOR-CON M) 20 MEQ tablet Take 1 tablet (20 mEq total) by mouth daily. 04/10/21 10/07/21  Loel Dubonnet, NP  rivaroxaban (XARELTO) 20 MG TABS tablet Take 1 tablet (20 mg total) by mouth daily with supper. 04/10/21   Loel Dubonnet, NP  spironolactone (ALDACTONE) 25 MG tablet Take 1 tablet (25 mg total) by mouth daily. 04/10/21 05/10/21  Loel Dubonnet, NP  topiramate (TOPAMAX) 25 MG tablet Take 2 tablets (50 mg total) by mouth at bedtime. 12/28/18   [name], MD  traZODone (DESYREL) 150 MG tablet Take 1 tablet (150 mg total) by mouth at bedtime. 12/28/18   [name], MD    Physical Exam: Vitals:   04/13/21 2130 04/13/21 2345 04/14/21 0000 04/14/21 0030  BP:   (!) 181/116 (!) 157/112  Pulse: (!) 115 (!) 112 (!) 118 98  Resp: (!) 21 (!) 32 (!) 35 (!) 32  Temp:      SpO2: 94% 95% 95% 95%  Weight:      Height:       Constitutional: NAD, calm, comfortable Eyes: PERRL, lids and conjunctivae normal ENMT: Mucous membranes are moist. Posterior pharynx clear of any exudate or lesions.Normal dentition.  Neck: normal, supple, no masses, no thyromegaly Respiratory: Few bibasilar rales, intermittent tachypnea Cardiovascular: IRR, IRR, 2+ BLE pitting edema Abdomen: no tenderness, no masses palpated. No hepatosplenomegaly. Bowel sounds positive.  Musculoskeletal: no clubbing / cyanosis. No joint deformity upper and lower extremities. Good ROM, no contractures. Normal muscle tone.  Skin: no rashes, lesions, ulcers. No induration Neurologic: CN 2-12 grossly intact.  Sensation intact, DTR normal. Strength 5/5 in all 4.  Psychiatric: Normal judgment and insight. Alert and oriented x 3. Normal mood.    Data Reviewed:  COVID+ CXR shows pulm edema, vascular congestion, cardiomegally Cocaine positive on UDS EKG shows A.Flutter, variable block, rate 126 BNP 1212 Trops neg   Assessment/Plan * Atrial fibrillation with RVR ([name])- (present on admission) Suspect loss of rate control triggered by combination of COVID-19 infection and cocaine use. Rate control with cardizem gtt Cont home PO cardizem, wean the gtt as able Avoid BB due to Cocaine use Cont Xarelto Tele monitor  COVID-19 virus infection- (present on admission) Mild at the moment with no O2 requirement. COVID pathway Daily labs At risk for progression to severe disease, cant use paxlovid due to xarelto use therefore will order Molnupiravir  Cocaine abuse ([place])- (present on admission) Admits to use last evening, UDS+, needs to quit before it kills her heart.  Rheumatic mitral regurgitation- (present on admission) Moderate in past, will see if its progressed at all on echo.  Acute on chronic diastolic CHF (congestive heart failure) ([place])- (present on admission) EF nl in Oct, suspect todays decompensation triggered by A.Fib RVR which in turn was triggered by combination of COVID-19 and cocaine use. CHF pathway Lasix 80mg  IV x1 in ED then 40mg  IV daily Cont home aldactone Tele monitor Repeat 2D echo  Essential hypertension- (present on admission) Continue / resume home BP meds. Giving dose of 0.1mg  PO clonidine now for ongoing HTN despite HR 100 with cardizem Dont want to use BB due to cocaine on board.  Chest pain, mid sternal-resolved as of 04/14/2021, (present on admission) Due to cocaine + A.Fib RVR.  Also possibly COVID.    Advance Care Planning:   Code Status: Full Code  Consults: None  Family Communication: No family in room  Severity of Illness: The appropriate  patient status for this patient is OBSERVATION. Observation status is judged to be reasonable and necessary in order to provide the required intensity of service to ensure the patient's safety. The patient's presenting symptoms, physical exam findings, and initial radiographic and laboratory data in the context of their medical condition is felt to place them at decreased risk for further clinical deterioration. Furthermore, it is anticipated that the patient will be medically  stable for discharge from the hospital within 2 midnights of admission.   Author: Etta Quill., DO 04/14/2021 2:22 AM  For on call review [email or web address].

## 2021-04-14 NOTE — Assessment & Plan Note (Signed)
Moderate in past, will see if its progressed at all on echo.

## 2021-04-14 NOTE — Assessment & Plan Note (Addendum)
Admits to use 1 day prior to this hospitalization-have counseled extensively

## 2021-04-14 NOTE — Progress Notes (Signed)
° °  Echocardiogram 2D Echocardiogram has been performed.  Morgan Bean 04/14/2021, 9:21 AM

## 2021-04-14 NOTE — Assessment & Plan Note (Addendum)
Initially required Cardizem infusion-rate controlled with short acting Cardizem.  We will transition back to long-acting Cardizem today.  Remains on Xarelto.    Not a candidate for beta-blocker given active cocaine use.  CHA2DS2-VASc of 4.  Reviewed last outpatient cardiology note-plans were for cardioversion-suspect can be done in the outpatient setting once she completely recovers from COVID-19 infection.

## 2021-04-14 NOTE — Progress Notes (Signed)
PROGRESS NOTE        PATIENT DETAILS Name: Morgan Bean Age: 56 y.o. Sex: female Date of Birth: [birth year] Admit Date: 04/13/2021 Admitting Physician Etta Quill, DO CNO:[id number], Charlane Ferretti, MD  Brief Summary: 56 year old with PAF, HFpEF, HTN, cocaine use-who presented with shortness of breath-patient was found to have A. fib with RVR and HFpEF exacerbation in the setting of ongoing cocaine use.  She was also found to have COVID-19 infection.  Significant events: 1/22>> admit for shortness of breath-after cocaine use-found to have A. fib RVR and HFpEF exacerbation.  Pertinent imaging studies: 1/22>> CXR: Vascular congestion/interstitial edema. 1/22>> CT head: No acute intracranial abnormality.  Subjective: Breathing better-appears very comfortable.  Objective: Vitals: Blood pressure (!) 130/101, pulse 93, temperature 98.5 F (36.9 C), temperature source Oral, resp. rate 15, height 5\' 6"  (1.676 m), weight 81.2 kg, last menstrual period 05/10/2013, SpO2 98 %.   Exam: Gen Exam:Alert awake-not in any distress HEENT:atraumatic, normocephalic Chest: Few bibasilar rales. CVS:S1S2 regular Abdomen:soft non tender, non distended Extremities:no edema Neurology: Non focal Skin: no rash  Pertinent Labs/Radiology: CBC Latest Ref Rng & Units 04/14/2021 04/13/2021 12/28/2020  WBC 4.0 - 10.5 K/uL 3.0(L) 3.6(L) 5.1  Hemoglobin 12.0 - 15.0 g/dL 12.0 12.1 11.2(L)  Hematocrit 36.0 - 46.0 % 37.6 37.4 34.9(L)  Platelets 150 - 400 K/uL 144(L) 143(L) 183    Lab Results  Component Value Date   NA 135 04/14/2021   K 3.4 (L) 04/14/2021   CL 105 04/14/2021   CO2 21 (L) 04/14/2021      Assessment/Plan: * Atrial fibrillation with RVR ([place])- (present on admission) Rate currently controlled with Cardizem gtt. Discussed with RN-starting short acting oral Cardizem-attempt to titrate off Cardizem gtt. Continue Xarelto.  Not a candidate for beta-blocker given active cocaine  use.  Acute on chronic diastolic CHF (congestive heart failure) ([place])- (present on admission) Improving-less shortness of breath-continue IV furosemide-await echocardiogram.  Follow weights/electrolytes/intake/output.   COVID-19 virus infection- (present on admission) Some mild URI like symptoms-on Molnupiravir  Essential hypertension- (present on admission) BP stable-continue lisinopril/Aldactone/Cardizem.    Hypokalemia Replete and recheck.  Chronic kidney disease, stage 3a ([place]) Close to baseline.  Follow electrolytes periodically.  Depression, recurrent ([place])- (present on admission) Appears stable-apparently not taking Wellbutrin or Celexa any longer.  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  Cocaine abuse ([place])- (present on admission) Admits to use 1 day prior to this hospitalization-have counseled extensively  Rheumatic mitral regurgitation- (present on admission) Moderate in past, will see if its progressed at all on echo.  Chest pain, mid sternal-resolved as of 04/14/2021, (present on admission) Due to cocaine + A.Fib RVR.  Also possibly COVID.   BMI: Estimated body mass index is 28.89 kg/m as calculated from the following:   Height as of this encounter: 5\' 6"  (1.676 m).   Weight as of this encounter: 81.2 kg.    DVT Prophylaxis: Xarelto Procedures: None Consults: None Code Status:Full code  Family Communication: None at bedside   Disposition Plan: Status is: Observation  The patient will require care spanning > 2 midnights and should be moved to inpatient because: A. fib RVR on Cardizem infusion-HFpEF with exacerbation-on IV Lasix.  COVID-19 infection.  Not yet stable for discharge.   Diet: Diet Order  Diet Heart Room service appropriate? Yes; Fluid consistency: Thin  Diet effective now                     Antimicrobial  agents: Anti-infectives (From admission, onward)    Start     Dose/Rate Route Frequency Ordered Stop   04/14/21 0215  molnupiravir EUA (LAGEVRIO) capsule 800 mg        4 capsule Oral 2 times daily 04/14/21 0208 04/18/21 2159        MEDICATIONS: Scheduled Meds:  atorvastatin  40 mg Oral QHS   diltiazem  60 mg Oral Q6H   fluticasone  2 spray Each Nare Daily   furosemide  40 mg Intravenous Daily   hydrOXYzine  50 mg Oral TID   lisinopril  10 mg Oral Daily   molnupiravir EUA  4 capsule Oral BID   pantoprazole  80 mg Oral Daily   potassium chloride SA  20 mEq Oral Daily   rivaroxaban  20 mg Oral Q supper   spironolactone  25 mg Oral Daily   Continuous Infusions:  diltiazem (CARDIZEM) infusion 15 mg/hr (04/14/21 0754)   PRN Meds:.acetaminophen, ondansetron (ZOFRAN) IV   I have personally reviewed following labs and imaging studies  LABORATORY DATA: CBC: Recent Labs  Lab 04/13/21 2240 04/14/21 0354  WBC 3.6* 3.0*  NEUTROABS 2.4 1.8  HGB 12.1 12.0  HCT 37.4 37.6  MCV 85.4 85.1  PLT 143* 144*    Basic Metabolic Panel: Recent Labs  Lab 04/13/21 2240 04/14/21 0354  NA 138 135  K 3.9 3.4*  CL 103 105  CO2 21* 21*  GLUCOSE 109* 113*  BUN 13 15  CREATININE 1.24* 1.21*  CALCIUM 9.3 8.8*    GFR: Estimated Creatinine Clearance: 55.1 mL/min (A) (by C-G formula based on SCr of 1.21 mg/dL (H)).  Liver Function Tests: Recent Labs  Lab 04/14/21 0354  AST 49*  ALT 46*  ALKPHOS 80  BILITOT 1.3*  PROT 7.6  ALBUMIN 3.9   No results for input(s): LIPASE, AMYLASE in the last 168 hours. No results for input(s): AMMONIA in the last 168 hours.  Coagulation Profile: No results for input(s): INR, PROTIME in the last 168 hours.  Cardiac Enzymes: No results for input(s): CKTOTAL, CKMB, CKMBINDEX, TROPONINI in the last 168 hours.  BNP (last 3 results) Recent Labs    12/17/20 1044  PROBNP 2,714*    Lipid Profile: No results for input(s): CHOL, HDL, LDLCALC,  TRIG, CHOLHDL, LDLDIRECT in the last 72 hours.  Thyroid Function Tests: No results for input(s): TSH, T4TOTAL, FREET4, T3FREE, THYROIDAB in the last 72 hours.  Anemia Panel: No results for input(s): VITAMINB12, FOLATE, FERRITIN, TIBC, IRON, RETICCTPCT in the last 72 hours.  Urine analysis:    Component Value Date/Time   COLORURINE COLORLESS (A) 04/13/2021 2337   APPEARANCEUR CLEAR 04/13/2021 2337   LABSPEC 1.005 04/13/2021 2337   PHURINE 7.0 04/13/2021 2337   GLUCOSEU NEGATIVE 04/13/2021 2337   HGBUR SMALL (A) 04/13/2021 2337   BILIRUBINUR NEGATIVE 04/13/2021 2337   BILIRUBINUR negative 06/29/2018 0928   BILIRUBINUR NEG 04/23/2014 [place] 04/13/2021 2337   PROTEINUR NEGATIVE 04/13/2021 2337   UROBILINOGEN 0.2 06/29/2018 0928   UROBILINOGEN 0.2 09/20/2013 1805   NITRITE NEGATIVE 04/13/2021 2337   LEUKOCYTESUR NEGATIVE 04/13/2021 2337    Sepsis Labs: Lactic Acid, Venous    Component Value Date/Time   LATICACIDVEN 1.0 12/16/2018 1335    MICROBIOLOGY: Recent Results (from the past 240 hour(s))  Resp Panel by RT-PCR (Flu A&B, Covid) Nasopharyngeal Swab     Status: Abnormal   Collection Time: 04/13/21 10:55 PM   Specimen: Nasopharyngeal Swab; Nasopharyngeal(NP) swabs in vial transport medium  Result Value Ref Range Status   SARS Coronavirus 2 by RT PCR POSITIVE (A) NEGATIVE Final    Comment: (NOTE) SARS-CoV-2 target nucleic acids are DETECTED.  The SARS-CoV-2 RNA is generally detectable in upper respiratory specimens during the acute phase of infection. Positive results are indicative of the presence of the identified virus, but do not rule out bacterial infection or co-infection with other pathogens not detected by the test. Clinical correlation with patient history and other diagnostic information is necessary to determine patient infection status. The expected result is Negative.  Fact Sheet for  Patients: EntrepreneurPulse.com.au  Fact Sheet for Healthcare Providers: IncredibleEmployment.be  This test is not yet approved or cleared by the [place] FDA and  has been authorized for detection and/or diagnosis of SARS-CoV-2 by FDA under an Emergency Use Authorization (EUA).  This EUA will remain in effect (meaning this test can be used) for the duration of  the COVID-19 declaration under Section 564(b)(1) of the A ct, 21 U.S.C. section 360bbb-3(b)(1), unless the authorization is terminated or revoked sooner.     Influenza A by PCR NEGATIVE NEGATIVE Final   Influenza B by PCR NEGATIVE NEGATIVE Final    Comment: (NOTE) The Xpert Xpress SARS-CoV-2/FLU/RSV plus assay is intended as an aid in the diagnosis of influenza from Nasopharyngeal swab specimens and should not be used as a sole basis for treatment. Nasal washings and aspirates are unacceptable for Xpert Xpress SARS-CoV-2/FLU/RSV testing.  Fact Sheet for Patients: EntrepreneurPulse.com.au  Fact Sheet for Healthcare Providers: IncredibleEmployment.be  This test is not yet approved or cleared by the [place] FDA and has been authorized for detection and/or diagnosis of SARS-CoV-2 by FDA under an Emergency Use Authorization (EUA). This EUA will remain in effect (meaning this test can be used) for the duration of the COVID-19 declaration under Section 564(b)(1) of the Act, 21 U.S.C. section 360bbb-3(b)(1), unless the authorization is terminated or revoked.  Performed at [facility], [place] [address], [place], [place] [address]     RADIOLOGY STUDIES/RESULTS: DG Chest 2 View  Result Date: 04/13/2021 CLINICAL DATA:  Shortness of breath, chest pain EXAM: CHEST - 2 VIEW COMPARISON:  12/26/2020 FINDINGS: Cardiomegaly, vascular congestion. Interstitial prominence likely reflects interstitial edema. No effusions or acute bony abnormality.  IMPRESSION: Cardiomegaly, vascular congestion, mild interstitial edema. Electronically Signed   By: Rolm Baptise M.D.   On: 04/13/2021 21:30   CT Head Wo Contrast  Result Date: 04/14/2021 CLINICAL DATA:  Headache. EXAM: CT HEAD WITHOUT CONTRAST TECHNIQUE: Contiguous axial images were obtained from the base of the skull through the vertex without intravenous contrast. RADIATION DOSE REDUCTION: This exam was performed according to the departmental dose-optimization program which includes automated exposure control, adjustment of the mA and/or kV according to patient size and/or use of iterative reconstruction technique. COMPARISON:  08/06/2013 FINDINGS: Brain: There is atrophy and chronic small vessel disease changes. No acute intracranial abnormality. Specifically, no hemorrhage, hydrocephalus, mass lesion, acute infarction, or significant intracranial injury. Vascular: No hyperdense vessel or unexpected calcification. Skull: No acute calvarial abnormality. Sinuses/Orbits: No acute findings Other: None IMPRESSION: Atrophy, chronic microvascular disease. No acute intracranial abnormality. Electronically Signed   By: Rolm Baptise M.D.   On: 04/14/2021 00:21   ECHOCARDIOGRAM COMPLETE  Result Date: 04/14/2021    ECHOCARDIOGRAM REPORT  Patient Name:   MYCAH MCDOUGALL Date of Exam: 04/14/2021 Medical Rec #:  [id number]    Height:       66.0 in Accession #:    [id number]   Weight:       179.0 lb Date of Birth:  Feb 03, 1966    BSA:          1.908 m Patient Age:    40 years     BP:           138/95 mmHg Patient Gender: F            HR:           87 bpm. Exam Location:  Inpatient Procedure: 2D Echo, Cardiac Doppler and Color Doppler Indications:    ACUTE CHF DIASTOLIC  History:        Patient has prior history of Echocardiogram examinations, most                 recent 12/25/2020. CHF, CAD, Stroke, Signs/Symptoms:Shortness of                 Breath; Risk Factors:Hypertension. TR / HLD.  Sonographer:    Beryle Beams  Referring Phys: [phone] JARED M GARDNER  Sonographer Comments: No subcostal window. IMPRESSIONS  1. Left ventricular ejection fraction, by estimation, is 60 to 65%. The left ventricle has normal function. The left ventricle has no regional wall motion abnormalities. Left ventricular diastolic parameters were normal.  2. Right ventricular systolic function is normal. The right ventricular size is normal.  3. Left atrial size was moderately dilated.  4. Right atrial size was moderately dilated.  5. The mitral valve is abnormal. Mild mitral valve regurgitation. No evidence of mitral stenosis.  6. The aortic valve is tricuspid. There is mild calcification of the aortic valve. Aortic valve regurgitation is moderate. Aortic valve sclerosis is present, with no evidence of aortic valve stenosis.  7. The inferior vena cava is normal in size with greater than 50% respiratory variability, suggesting right atrial pressure of 3 mmHg. FINDINGS  Left Ventricle: Left ventricular ejection fraction, by estimation, is 60 to 65%. The left ventricle has normal function. The left ventricle has no regional wall motion abnormalities. The left ventricular internal cavity size was normal in size. There is  no left ventricular hypertrophy. Left ventricular diastolic parameters were normal. Right Ventricle: IVC NOT SEEN. The right ventricular size is normal. No increase in right ventricular wall thickness. Right ventricular systolic function is normal. Left Atrium: Left atrial size was moderately dilated. Right Atrium: Right atrial size was moderately dilated. Pericardium: There is no evidence of pericardial effusion. Mitral Valve: The mitral valve is abnormal. There is mild thickening of the mitral valve leaflet(s). There is mild calcification of the mitral valve leaflet(s). Mild mitral valve regurgitation. No evidence of mitral valve stenosis. MV peak gradient, 96.4  mmHg. The mean mitral valve gradient is 62.0 mmHg. Tricuspid Valve: The  tricuspid valve is normal in structure. Tricuspid valve regurgitation is mild . No evidence of tricuspid stenosis. Aortic Valve: The aortic valve is tricuspid. There is mild calcification of the aortic valve. Aortic valve regurgitation is moderate. Aortic regurgitation PHT measures 1080 msec. Aortic valve sclerosis is present, with no evidence of aortic valve stenosis. Aortic valve mean gradient measures 9.0 mmHg. Aortic valve peak gradient measures 17.0 mmHg. Aortic valve area, by VTI measures 0.76 cm. Pulmonic Valve: The pulmonic valve was normal in structure. Pulmonic valve regurgitation is not visualized. No evidence of pulmonic  stenosis. Aorta: The aortic root is normal in size and structure. Venous: The inferior vena cava is normal in size with greater than 50% respiratory variability, suggesting right atrial pressure of 3 mmHg. IAS/Shunts: The interatrial septum was not well visualized.  LEFT VENTRICLE PLAX 2D LVIDd:         4.20 cm LVIDs:         3.00 cm LV PW:         1.40 cm LV IVS:        1.10 cm LVOT diam:     1.80 cm LV SV:         21 LV SV Index:   11 LVOT Area:     2.54 cm  LV Volumes (MOD) LV vol d, MOD A2C: 72.6 ml LV vol d, MOD A4C: 76.2 ml LV vol s, MOD A2C: 43.3 ml LV vol s, MOD A4C: 44.7 ml LV SV MOD A2C:     29.3 ml LV SV MOD A4C:     76.2 ml LV SV MOD BP:      33.3 ml RIGHT VENTRICLE RV S prime:     20.70 cm/s TAPSE (M-mode): 1.9 cm LEFT ATRIUM              Index        RIGHT ATRIUM           Index LA diam:        4.00 cm  2.10 cm/m   RA Area:     28.00 cm LA Vol (A2C):   136.0 ml 71.29 ml/m  RA Volume:   101.00 ml 52.94 ml/m LA Vol (A4C):   74.2 ml  38.89 ml/m LA Biplane Vol: 102.0 ml 53.47 ml/m  AORTIC VALVE                     PULMONIC VALVE AV Area (Vmax):    0.87 cm      PV Vmax:       0.68 m/s AV Area (Vmean):   0.79 cm      PV Vmean:      42.500 cm/s AV Area (VTI):     0.76 cm      PV VTI:        0.101 m AV Vmax:           206.00 cm/s   PV Peak grad:  1.8 mmHg AV Vmean:           133.000 cm/s  PV Mean grad:  1.0 mmHg AV VTI:            0.271 m AV Peak Grad:      17.0 mmHg AV Mean Grad:      9.0 mmHg LVOT Vmax:         70.20 cm/s LVOT Vmean:        41.400 cm/s LVOT VTI:          0.081 m LVOT/AV VTI ratio: 0.30 AI PHT:            1080 msec  AORTA Ao Root diam: 3.30 cm Ao Asc diam:  3.50 cm MITRAL VALVE              TRICUSPID VALVE MV Area VTI:  0.15 cm    TV Peak grad:   22.1 mmHg MV Peak grad: 96.4 mmHg   TV Mean grad:   13.0 mmHg MV Mean grad: 62.0 mmHg   TV Vmax:  2.35 m/s MV Vmax:      4.91 m/s    TV Vmean:       170.0 cm/s MV Vmean:     370.0 cm/s  TV VTI:         0.59 msec                           TR Peak grad:   25.4 mmHg                           TR Vmax:        252.00 cm/s                            SHUNTS                           Systemic VTI:  0.08 m                           Systemic Diam: 1.80 cm Jenkins Rouge MD Electronically signed by Jenkins Rouge MD Signature Date/Time: 04/14/2021/9:40:16 AM    Final      LOS: 0 days   Oren Binet, MD  [facility]    To contact the attending provider between 7A-7P or the covering provider during after hours 7P-7A, please log into the web site [email or web address] and access using universal [place] password for that web site. If you do not have the password, please call the hospital operator.  04/14/2021, 10:52 AM

## 2021-04-14 NOTE — Assessment & Plan Note (Signed)
Not in exacerbation-continue bronchodilators.

## 2021-04-14 NOTE — ED Notes (Signed)
Pt placed on 2LNC, as she dipped to 89% while asleep

## 2021-04-14 NOTE — Assessment & Plan Note (Addendum)
BP stable-continue lisinopril/Cardizem.

## 2021-04-14 NOTE — Assessment & Plan Note (Signed)
Appears stable-apparently not taking Wellbutrin or Celexa any longer.

## 2021-04-14 NOTE — ED Notes (Signed)
Breakfast order placed ?

## 2021-04-14 NOTE — ED Notes (Signed)
Patient states she is feeling better.

## 2021-04-14 NOTE — ED Notes (Signed)
Pt taken to CT.

## 2021-04-15 DIAGNOSIS — F1410 Cocaine abuse, uncomplicated: Secondary | ICD-10-CM

## 2021-04-15 DIAGNOSIS — R0789 Other chest pain: Secondary | ICD-10-CM

## 2021-04-15 LAB — COMPREHENSIVE METABOLIC PANEL
ALT: 34 U/L (ref 0–44)
AST: 38 U/L (ref 15–41)
Albumin: 3.2 g/dL — ABNORMAL LOW (ref 3.5–5.0)
Alkaline Phosphatase: 69 U/L (ref 38–126)
Anion gap: 8 (ref 5–15)
BUN: 26 mg/dL — ABNORMAL HIGH (ref 6–20)
CO2: 23 mmol/L (ref 22–32)
Calcium: 8.4 mg/dL — ABNORMAL LOW (ref 8.9–10.3)
Chloride: 103 mmol/L (ref 98–111)
Creatinine, Ser: 1.99 mg/dL — ABNORMAL HIGH (ref 0.44–1.00)
GFR, Estimated: 29 mL/min — ABNORMAL LOW (ref >60)
Glucose, Bld: 102 mg/dL — ABNORMAL HIGH (ref 70–99)
Potassium: 3.3 mmol/L — ABNORMAL LOW (ref 3.5–5.1)
Sodium: 134 mmol/L — ABNORMAL LOW (ref 135–145)
Total Bilirubin: 0.8 mg/dL (ref 0.3–1.2)
Total Protein: 6.4 g/dL — ABNORMAL LOW (ref 6.5–8.1)

## 2021-04-15 LAB — CBC WITH DIFFERENTIAL/PLATELET
Abs Immature Granulocytes: 0 10*3/uL (ref 0.00–0.07)
Basophils Absolute: 0 10*3/uL (ref 0.0–0.1)
Basophils Relative: 1 %
Eosinophils Absolute: 0 10*3/uL (ref 0.0–0.5)
Eosinophils Relative: 1 %
HCT: 38.4 % (ref 36.0–46.0)
Hemoglobin: 12.3 g/dL (ref 12.0–15.0)
Lymphocytes Relative: 44 %
Lymphs Abs: 1.8 10*3/uL (ref 0.7–4.0)
MCH: 27.3 pg (ref 26.0–34.0)
MCHC: 32 g/dL (ref 30.0–36.0)
MCV: 85.3 fL (ref 80.0–100.0)
Monocytes Absolute: 0.6 10*3/uL (ref 0.1–1.0)
Monocytes Relative: 14 %
Neutro Abs: 1.7 10*3/uL (ref 1.7–7.7)
Neutrophils Relative %: 40 %
Platelets: 144 10*3/uL — ABNORMAL LOW (ref 150–400)
RBC: 4.5 MIL/uL (ref 3.87–5.11)
RDW: 15.3 % (ref 11.5–15.5)
WBC: 4.2 10*3/uL (ref 4.0–10.5)
nRBC: 0 % (ref 0.0–0.2)
nRBC: 0 /100 WBC

## 2021-04-15 LAB — D-DIMER, QUANTITATIVE: D-Dimer, Quant: 0.62 ug/mL-FEU — ABNORMAL HIGH (ref 0.00–0.50)

## 2021-04-15 MED ORDER — FUROSEMIDE 10 MG/ML IJ SOLN: 40.0000 mg | Freq: Every day | INTRAMUSCULAR | Status: DC

## 2021-04-15 MED ORDER — POTASSIUM CHLORIDE CRYS ER 20 MEQ PO TBCR: 40.0000 meq | EXTENDED_RELEASE_TABLET | Freq: Every day | ORAL | Status: DC

## 2021-04-15 MED ORDER — SPIRONOLACTONE 25 MG PO TABS
25.0000 mg | ORAL_TABLET | Freq: Every day | ORAL | Status: DC
  Administered 2021-04-16 – 2021-04-17 (×2): 25 mg via ORAL
  Filled 2021-04-15 (×2): qty 1

## 2021-04-15 MED ORDER — FUROSEMIDE 40 MG PO TABS
40.0000 mg | ORAL_TABLET | Freq: Every day | ORAL | Status: DC
  Administered 2021-04-16 – 2021-04-17 (×2): 40 mg via ORAL
  Filled 2021-04-15 (×2): qty 1

## 2021-04-15 NOTE — Progress Notes (Signed)
PROGRESS NOTE        PATIENT DETAILS Name: Morgan Bean Age: 56 y.o. Sex: female Date of Birth: January 28, 1966 Admit Date: 04/13/2021 Admitting Physician Jonetta Osgood, MD PZW:CHENID, Charlane Ferretti, MD  Brief Summary: 56 year old with PAF, HFpEF, HTN, cocaine use-who presented with shortness of breath-patient was found to have A. fib with RVR and HFpEF exacerbation in the setting of ongoing cocaine use.  She was also found to have COVID-19 infection.  Significant events: 1/22>> admit for shortness of breath-after cocaine use-found to have A. fib RVR and HFpEF exacerbation.  Pertinent imaging studies: 1/22>> CXR: Vascular congestion/interstitial edema. 1/22>> CT head: No acute intracranial abnormality.  Subjective: Feels much better-was on O2 overnight-but when I was in the room-I titrated to room air.  Objective: Vitals: Blood pressure 111/75, pulse 91, temperature 97.8 F (36.6 C), temperature source Oral, resp. rate (!) 22, height 5\' 6"  (1.676 m), weight 81.2 kg, last menstrual period 05/10/2013, SpO2 98 %.   Exam: Gen Exam:Alert awake-not in any distress HEENT:atraumatic, normocephalic Chest: B/L clear to auscultation anteriorly CVS:S1S2 regular Abdomen:soft non tender, non distended Extremities:no edema Neurology: Non focal Skin: no rash   Pertinent Labs/Radiology: CBC Latest Ref Rng & Units 04/15/2021 04/14/2021 04/13/2021  WBC 4.0 - 10.5 K/uL 4.2 3.0(L) 3.6(L)  Hemoglobin 12.0 - 15.0 g/dL 12.3 12.0 12.1  Hematocrit 36.0 - 46.0 % 38.4 37.6 37.4  Platelets 150 - 400 K/uL 144(L) 144(L) 143(L)    Lab Results  Component Value Date   NA 134 (L) 04/15/2021   K 3.3 (L) 04/15/2021   CL 103 04/15/2021   CO2 23 04/15/2021      Assessment/Plan: * Persistent atrial fibrillation with RVR ([place])- (present on admission) Remains in A. fib but rate controlled-no longer on Cardizem gtt.  Continue short acting Cardizem-if remains with controlled-suspect we  could consolidate to a long-acting Cardizem tomorrow.  Remains on Xarelto.   Not a candidate for beta-blocker given active cocaine use.  CHA2DS2-VASc of 4.  Acute on chronic diastolic CHF (congestive heart failure) ([place])- (present on admission) Volume status is markedly improved diuretics today due to jump in creatinine.  Echo on 1/23 with preserved EF of 60-65%.   COVID-19 virus infection- (present on admission) Some mild URI like symptoms-on Molnupiravir  Essential hypertension- (present on admission) BP stable-continue lisinopril/Cardizem.  Diuretics on hold.    Hypokalemia Replete and recheck.  Chronic kidney disease, stage 3a ([name]) Slight bump in creatinine-holding diuretics today.  Repeat electrolytes tomorrow.  Depression, recurrent ([place])- (present on admission) Appears stable-apparently not taking Wellbutrin or Celexa any longer.  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  Cocaine abuse ([place])- (present on admission) Admits to use 1 day prior to this hospitalization-have counseled extensively  Rheumatic mitral regurgitation-resolved as of 04/15/2021 Moderate in past, will see if its progressed at all on echo.  Chest pain, mid sternal-resolved as of 04/14/2021, (present on admission) Due to cocaine + A.Fib RVR.  Also possibly COVID.   BMI: Estimated body mass index is 28.89 kg/m as calculated from the following:   Height as of this encounter: 5\' 6"  (1.676 m).   Weight as of this encounter: 81.2 kg.    DVT Prophylaxis: Xarelto Procedures: None Consults: None Code Status:Full code  Family Communication: None at bedside   Disposition Plan: Status  is: Observation  The patient will require care spanning > 2 midnights and should be moved to inpatient because: A. fib RVR on Cardizem infusion-HFpEF with exacerbation-on IV Lasix.  COVID-19 infection.  Not yet stable for  discharge.   Diet: Diet Order             Diet Heart Room service appropriate? Yes; Fluid consistency: Thin  Diet effective now                     Antimicrobial agents: Anti-infectives (From admission, onward)    Start     Dose/Rate Route Frequency Ordered Stop   04/14/21 0215  molnupiravir EUA (LAGEVRIO) capsule 800 mg        4 capsule Oral 2 times daily 04/14/21 0208 04/18/21 2159        MEDICATIONS: Scheduled Meds:  arformoterol  15 mcg Nebulization BID   atorvastatin  40 mg Oral QHS   budesonide (PULMICORT) nebulizer solution  0.25 mg Nebulization BID   diltiazem  60 mg Oral Q6H   fluticasone  2 spray Each Nare Daily   [START ON 04/16/2021] furosemide  40 mg Intravenous Daily   hydrOXYzine  50 mg Oral TID   lisinopril  10 mg Oral Daily   molnupiravir EUA  4 capsule Oral BID   pantoprazole  80 mg Oral Daily   potassium chloride SA  40 mEq Oral Daily   rivaroxaban  20 mg Oral Q supper   [START ON 04/16/2021] spironolactone  25 mg Oral Daily   umeclidinium bromide  1 puff Inhalation Daily   Continuous Infusions:  diltiazem (CARDIZEM) infusion Stopped (04/14/21 1433)   PRN Meds:.acetaminophen, levalbuterol, ondansetron (ZOFRAN) IV   I have personally reviewed following labs and imaging studies  LABORATORY DATA: CBC: Recent Labs  Lab 04/13/21 2240 04/14/21 0354 04/15/21 0324  WBC 3.6* 3.0* 4.2  NEUTROABS 2.4 1.8 1.7  HGB 12.1 12.0 12.3  HCT 37.4 37.6 38.4  MCV 85.4 85.1 85.3  PLT 143* 144* 144*    Basic Metabolic Panel: Recent Labs  Lab 04/13/21 2240 04/14/21 0354 04/15/21 0324  NA 138 135 134*  K 3.9 3.4* 3.3*  CL 103 105 103  CO2 21* 21* 23  GLUCOSE 109* 113* 102*  BUN 13 15 26*  CREATININE 1.24* 1.21* 1.99*  CALCIUM 9.3 8.8* 8.4*    GFR: Estimated Creatinine Clearance: 33.5 mL/min (A) (by C-G formula based on SCr of 1.99 mg/dL (H)).  Liver Function Tests: Recent Labs  Lab 04/14/21 0354 04/15/21 0324  AST 49* 38  ALT 46* 34   ALKPHOS 80 69  BILITOT 1.3* 0.8  PROT 7.6 6.4*  ALBUMIN 3.9 3.2*   No results for input(s): LIPASE, AMYLASE in the last 168 hours. No results for input(s): AMMONIA in the last 168 hours.  Coagulation Profile: No results for input(s): INR, PROTIME in the last 168 hours.  Cardiac Enzymes: No results for input(s): CKTOTAL, CKMB, CKMBINDEX, TROPONINI in the last 168 hours.  BNP (last 3 results) Recent Labs    12/17/20 1044  PROBNP 2,714*    Lipid Profile: No results for input(s): CHOL, HDL, LDLCALC, TRIG, CHOLHDL, LDLDIRECT in the last 72 hours.  Thyroid Function Tests: No results for input(s): TSH, T4TOTAL, FREET4, T3FREE, THYROIDAB in the last 72 hours.  Anemia Panel: No results for input(s): VITAMINB12, FOLATE, FERRITIN, TIBC, IRON, RETICCTPCT in the last 72 hours.  Urine analysis:    Component Value Date/Time   COLORURINE COLORLESS (A) 04/13/2021 2337  APPEARANCEUR CLEAR 04/13/2021 2337   LABSPEC 1.005 04/13/2021 2337   PHURINE 7.0 04/13/2021 2337   GLUCOSEU NEGATIVE 04/13/2021 2337   HGBUR SMALL (A) 04/13/2021 2337   BILIRUBINUR NEGATIVE 04/13/2021 2337   BILIRUBINUR negative 06/29/2018 0928   BILIRUBINUR NEG 04/23/2014 [place] 04/13/2021 2337   PROTEINUR NEGATIVE 04/13/2021 2337   UROBILINOGEN 0.2 06/29/2018 0928   UROBILINOGEN 0.2 09/20/2013 1805   NITRITE NEGATIVE 04/13/2021 2337   LEUKOCYTESUR NEGATIVE 04/13/2021 2337    Sepsis Labs: Lactic Acid, Venous    Component Value Date/Time   LATICACIDVEN 1.0 12/16/2018 1335    MICROBIOLOGY: Recent Results (from the past 240 hour(s))  Resp Panel by RT-PCR (Flu A&B, Covid) Nasopharyngeal Swab     Status: Abnormal   Collection Time: 04/13/21 10:55 PM   Specimen: Nasopharyngeal Swab; Nasopharyngeal(NP) swabs in vial transport medium  Result Value Ref Range Status   SARS Coronavirus 2 by RT PCR POSITIVE (A) NEGATIVE Final    Comment: (NOTE) SARS-CoV-2 target nucleic acids are  DETECTED.  The SARS-CoV-2 RNA is generally detectable in upper respiratory specimens during the acute phase of infection. Positive results are indicative of the presence of the identified virus, but do not rule out bacterial infection or co-infection with other pathogens not detected by the test. Clinical correlation with patient history and other diagnostic information is necessary to determine patient infection status. The expected result is Negative.  Fact Sheet for Patients: EntrepreneurPulse.com.au  Fact Sheet for Healthcare Providers: IncredibleEmployment.be  This test is not yet approved or cleared by the [place] FDA and  has been authorized for detection and/or diagnosis of SARS-CoV-2 by FDA under an Emergency Use Authorization (EUA).  This EUA will remain in effect (meaning this test can be used) for the duration of  the COVID-19 declaration under Section 564(b)(1) of the A ct, 21 U.S.C. section 360bbb-3(b)(1), unless the authorization is terminated or revoked sooner.     Influenza A by PCR NEGATIVE NEGATIVE Final   Influenza B by PCR NEGATIVE NEGATIVE Final    Comment: (NOTE) The Xpert Xpress SARS-CoV-2/FLU/RSV plus assay is intended as an aid in the diagnosis of influenza from Nasopharyngeal swab specimens and should not be used as a sole basis for treatment. Nasal washings and aspirates are unacceptable for Xpert Xpress SARS-CoV-2/FLU/RSV testing.  Fact Sheet for Patients: EntrepreneurPulse.com.au  Fact Sheet for Healthcare Providers: IncredibleEmployment.be  This test is not yet approved or cleared by the [place] FDA and has been authorized for detection and/or diagnosis of SARS-CoV-2 by FDA under an Emergency Use Authorization (EUA). This EUA will remain in effect (meaning this test can be used) for the duration of the COVID-19 declaration under Section 564(b)(1) of the Act, 21  U.S.C. section 360bbb-3(b)(1), unless the authorization is terminated or revoked.  Performed at [facility], [name] [address] [place], [place] [address]     RADIOLOGY STUDIES/RESULTS: DG Chest 2 View  Result Date: 04/13/2021 CLINICAL DATA:  Shortness of breath, chest pain EXAM: CHEST - 2 VIEW COMPARISON:  12/26/2020 FINDINGS: Cardiomegaly, vascular congestion. Interstitial prominence likely reflects interstitial edema. No effusions or acute bony abnormality. IMPRESSION: Cardiomegaly, vascular congestion, mild interstitial edema. Electronically Signed   By: Rolm Baptise M.D.   On: 04/13/2021 21:30   CT Head Wo Contrast  Result Date: 04/14/2021 CLINICAL DATA:  Headache. EXAM: CT HEAD WITHOUT CONTRAST TECHNIQUE: Contiguous axial images were obtained from the base of the skull through the vertex without intravenous contrast. RADIATION DOSE REDUCTION:  This exam was performed according to the departmental dose-optimization program which includes automated exposure control, adjustment of the mA and/or kV according to patient size and/or use of iterative reconstruction technique. COMPARISON:  08/06/2013 FINDINGS: Brain: There is atrophy and chronic small vessel disease changes. No acute intracranial abnormality. Specifically, no hemorrhage, hydrocephalus, mass lesion, acute infarction, or significant intracranial injury. Vascular: No hyperdense vessel or unexpected calcification. Skull: No acute calvarial abnormality. Sinuses/Orbits: No acute findings Other: None IMPRESSION: Atrophy, chronic microvascular disease. No acute intracranial abnormality. Electronically Signed   By: Rolm Baptise M.D.   On: 04/14/2021 00:21   ECHOCARDIOGRAM COMPLETE  Result Date: 04/14/2021    ECHOCARDIOGRAM REPORT   Patient Name:   [facility] Date of Exam: 04/14/2021 Medical Rec #:  [id number]    Height:       66.0 in Accession #:    [id number]   Weight:       179.0 lb Date of Birth:  11/15/1965    BSA:          1.908 m  Patient Age:    55 years     BP:           138/95 mmHg Patient Gender: F            HR:           87 bpm. Exam Location:  Inpatient Procedure: 2D Echo, Cardiac Doppler and Color Doppler Indications:    ACUTE CHF DIASTOLIC  History:        Patient has prior history of Echocardiogram examinations, most                 recent 12/25/2020. CHF, CAD, Stroke, Signs/Symptoms:Shortness of                 Breath; Risk Factors:Hypertension. TR / HLD.  Sonographer:    Beryle Beams Referring Phys: [phone] JARED M GARDNER  Sonographer Comments: No subcostal window. IMPRESSIONS  1. Left ventricular ejection fraction, by estimation, is 60 to 65%. The left ventricle has normal function. The left ventricle has no regional wall motion abnormalities. Left ventricular diastolic parameters were normal.  2. Right ventricular systolic function is normal. The right ventricular size is normal.  3. Left atrial size was moderately dilated.  4. Right atrial size was moderately dilated.  5. The mitral valve is abnormal. Mild mitral valve regurgitation. No evidence of mitral stenosis.  6. The aortic valve is tricuspid. There is mild calcification of the aortic valve. Aortic valve regurgitation is moderate. Aortic valve sclerosis is present, with no evidence of aortic valve stenosis.  7. The inferior vena cava is normal in size with greater than 50% respiratory variability, suggesting right atrial pressure of 3 mmHg. FINDINGS  Left Ventricle: Left ventricular ejection fraction, by estimation, is 60 to 65%. The left ventricle has normal function. The left ventricle has no regional wall motion abnormalities. The left ventricular internal cavity size was normal in size. There is  no left ventricular hypertrophy. Left ventricular diastolic parameters were normal. Right Ventricle: IVC NOT SEEN. The right ventricular size is normal. No increase in right ventricular wall thickness. Right ventricular systolic function is normal. Left Atrium: Left atrial size  was moderately dilated. Right Atrium: Right atrial size was moderately dilated. Pericardium: There is no evidence of pericardial effusion. Mitral Valve: The mitral valve is abnormal. There is mild thickening of the mitral valve leaflet(s). There is mild calcification of the mitral valve leaflet(s). Mild mitral valve regurgitation. No evidence  of mitral valve stenosis. MV peak gradient, 96.4  mmHg. The mean mitral valve gradient is 62.0 mmHg. Tricuspid Valve: The tricuspid valve is normal in structure. Tricuspid valve regurgitation is mild . No evidence of tricuspid stenosis. Aortic Valve: The aortic valve is tricuspid. There is mild calcification of the aortic valve. Aortic valve regurgitation is moderate. Aortic regurgitation PHT measures 1080 msec. Aortic valve sclerosis is present, with no evidence of aortic valve stenosis. Aortic valve mean gradient measures 9.0 mmHg. Aortic valve peak gradient measures 17.0 mmHg. Aortic valve area, by VTI measures 0.76 cm. Pulmonic Valve: The pulmonic valve was normal in structure. Pulmonic valve regurgitation is not visualized. No evidence of pulmonic stenosis. Aorta: The aortic root is normal in size and structure. Venous: The inferior vena cava is normal in size with greater than 50% respiratory variability, suggesting right atrial pressure of 3 mmHg. IAS/Shunts: The interatrial septum was not well visualized.  LEFT VENTRICLE PLAX 2D LVIDd:         4.20 cm LVIDs:         3.00 cm LV PW:         1.40 cm LV IVS:        1.10 cm LVOT diam:     1.80 cm LV SV:         21 LV SV Index:   11 LVOT Area:     2.54 cm  LV Volumes (MOD) LV vol d, MOD A2C: 72.6 ml LV vol d, MOD A4C: 76.2 ml LV vol s, MOD A2C: 43.3 ml LV vol s, MOD A4C: 44.7 ml LV SV MOD A2C:     29.3 ml LV SV MOD A4C:     76.2 ml LV SV MOD BP:      33.3 ml RIGHT VENTRICLE RV S prime:     20.70 cm/s TAPSE (M-mode): 1.9 cm LEFT ATRIUM              Index        RIGHT ATRIUM           Index LA diam:        4.00 cm  2.10 cm/m    RA Area:     28.00 cm LA Vol (A2C):   136.0 ml 71.29 ml/m  RA Volume:   101.00 ml 52.94 ml/m LA Vol (A4C):   74.2 ml  38.89 ml/m LA Biplane Vol: 102.0 ml 53.47 ml/m  AORTIC VALVE                     PULMONIC VALVE AV Area (Vmax):    0.87 cm      PV Vmax:       0.68 m/s AV Area (Vmean):   0.79 cm      PV Vmean:      42.500 cm/s AV Area (VTI):     0.76 cm      PV VTI:        0.101 m AV Vmax:           206.00 cm/s   PV Peak grad:  1.8 mmHg AV Vmean:          133.000 cm/s  PV Mean grad:  1.0 mmHg AV VTI:            0.271 m AV Peak Grad:      17.0 mmHg AV Mean Grad:      9.0 mmHg LVOT Vmax:         70.20 cm/s LVOT Vmean:  41.400 cm/s LVOT VTI:          0.081 m LVOT/AV VTI ratio: 0.30 AI PHT:            1080 msec  AORTA Ao Root diam: 3.30 cm Ao Asc diam:  3.50 cm MITRAL VALVE              TRICUSPID VALVE MV Area VTI:  0.15 cm    TV Peak grad:   22.1 mmHg MV Peak grad: 96.4 mmHg   TV Mean grad:   13.0 mmHg MV Mean grad: 62.0 mmHg   TV Vmax:        2.35 m/s MV Vmax:      4.91 m/s    TV Vmean:       170.0 cm/s MV Vmean:     370.0 cm/s  TV VTI:         0.59 msec                           TR Peak grad:   25.4 mmHg                           TR Vmax:        252.00 cm/s                            SHUNTS                           Systemic VTI:  0.08 m                           Systemic Diam: 1.80 cm Jenkins Rouge MD Electronically signed by Jenkins Rouge MD Signature Date/Time: 04/14/2021/9:40:16 AM    Final      LOS: 1 day   Oren Binet, MD  [facility]    To contact the attending provider between 7A-7P or the covering provider during after hours 7P-7A, please log into the web site [email or web address] and access using universal [place] password for that web site. If you do not have the password, please call the hospital operator.  04/15/2021, 11:51 AM

## 2021-04-15 NOTE — ED Notes (Signed)
Breakfast orders placed 

## 2021-04-15 NOTE — ED Notes (Signed)
Hospitalist at bedside assessing pt at this time. Per MD, pt placed on RA.

## 2021-04-16 ENCOUNTER — Inpatient Hospital Stay (HOSPITAL_COMMUNITY): Payer: Medicaid Other

## 2021-04-16 DIAGNOSIS — I5033 Acute on chronic diastolic (congestive) heart failure: Secondary | ICD-10-CM

## 2021-04-16 LAB — CBC WITH DIFFERENTIAL/PLATELET
Abs Immature Granulocytes: 0.02 10*3/uL (ref 0.00–0.07)
Basophils Absolute: 0 10*3/uL (ref 0.0–0.1)
Basophils Relative: 0 %
Eosinophils Absolute: 0.1 10*3/uL (ref 0.0–0.5)
Eosinophils Relative: 1 %
HCT: 37.7 % (ref 36.0–46.0)
Hemoglobin: 12.3 g/dL (ref 12.0–15.0)
Immature Granulocytes: 1 %
Lymphocytes Relative: 48 %
Lymphs Abs: 2 10*3/uL (ref 0.7–4.0)
MCH: 27.5 pg (ref 26.0–34.0)
MCHC: 32.6 g/dL (ref 30.0–36.0)
MCV: 84.3 fL (ref 80.0–100.0)
Monocytes Absolute: 0.5 10*3/uL (ref 0.1–1.0)
Monocytes Relative: 13 %
Neutro Abs: 1.5 10*3/uL — ABNORMAL LOW (ref 1.7–7.7)
Neutrophils Relative %: 37 %
Platelets: 162 10*3/uL (ref 150–400)
RBC: 4.47 MIL/uL (ref 3.87–5.11)
RDW: 15.4 % (ref 11.5–15.5)
WBC: 4.1 10*3/uL (ref 4.0–10.5)
nRBC: 0 % (ref 0.0–0.2)

## 2021-04-16 LAB — COMPREHENSIVE METABOLIC PANEL
ALT: 33 U/L (ref 0–44)
AST: 39 U/L (ref 15–41)
Albumin: 3.2 g/dL — ABNORMAL LOW (ref 3.5–5.0)
Alkaline Phosphatase: 57 U/L (ref 38–126)
Anion gap: 10 (ref 5–15)
BUN: 19 mg/dL (ref 6–20)
CO2: 20 mmol/L — ABNORMAL LOW (ref 22–32)
Calcium: 8.6 mg/dL — ABNORMAL LOW (ref 8.9–10.3)
Chloride: 106 mmol/L (ref 98–111)
Creatinine, Ser: 1.34 mg/dL — ABNORMAL HIGH (ref 0.44–1.00)
GFR, Estimated: 46 mL/min — ABNORMAL LOW (ref >60)
Glucose, Bld: 90 mg/dL (ref 70–99)
Potassium: 4 mmol/L (ref 3.5–5.1)
Sodium: 136 mmol/L (ref 135–145)
Total Bilirubin: 0.8 mg/dL (ref 0.3–1.2)
Total Protein: 6.4 g/dL — ABNORMAL LOW (ref 6.5–8.1)

## 2021-04-16 MED ORDER — FUROSEMIDE 10 MG/ML IJ SOLN
20.0000 mg | Freq: Once | INTRAMUSCULAR | Status: AC
Start: 2021-04-16 — End: 2021-04-16
  Administered 2021-04-16: 12:00:00 20 mg via INTRAVENOUS
  Filled 2021-04-16: qty 2

## 2021-04-16 NOTE — Progress Notes (Signed)
Mobility Specialist Progress Note:   04/16/21 1220  Mobility  Activity Ambulated with assistance in room  Level of Assistance Standby assist, set-up cues, supervision of patient - no hands on  Assistive Device None  Distance Ambulated (ft) 60 ft  Activity Response Tolerated well  $Mobility charge 1 Mobility   During Mobility: 120 HR  Pt received in bed willing to participate in mobility. No complaints of pain. A little SOB when ambulating. Pt left EOB with call bell in reach and all needs met.   [facility] Public librarian Phone [phone] Secondary Phone [phone]

## 2021-04-16 NOTE — TOC Progression Note (Signed)
Transition of Care [facility]) - Progression Note    Patient Details  Name: Morgan Bean MRN: [id number] Date of Birth: 08-25-1965  Transition of Care [facility]) CM/SW Contact  Zenon Mayo, RN Phone Number: 04/16/2021, 11:55 AM  Clinical Narrative:     Transition of Care [facility]) Screening Note   Patient Details  Name: Morgan Bean Date of Birth: Aug 06, 1965   Transition of Care [facility]) CM/SW Contact:    Zenon Mayo, RN Phone Number: 04/16/2021, 11:55 AM    Transition of Care Department [facility]) has reviewed patient and no TOC needs have been identified at this time. We will continue to monitor patient advancement through interdisciplinary progression rounds. If new patient transition needs arise, please place a TOC consult.          Expected Discharge Plan and Services                                                 Social Determinants of Health (SDOH) Interventions    Readmission Risk Interventions No flowsheet data found.

## 2021-04-16 NOTE — Progress Notes (Addendum)
Heart Failure Nurse Navigator Progress Note  Following this hospitalization to assess for HV TOC readiness. Pt continues to partake in cocaine use. Pt declined HV TOC clinic appt upon DC from hospitalization. Pt educated on benefits of HV TOC. Pt education complete regarding HF patient booklet.   Education Assessment and Provision:  Detailed education and instructions provided on heart failure disease management including the following:  Signs and symptoms of Heart Failure When to call the physician Importance of daily weights Low sodium diet Fluid restriction Medication management Anticipated future follow-up appointments  Patient education given on each of the above topics.  Patient acknowledges understanding via teach back method and acceptance of all instructions.  Education Materials:  "Living Better With Heart Failure" Booklet, HF zone tool, & Daily Weight Tracker Tool.  Patient has scale at home: yes Patient has pill box at home: declined  Encouraged cessation of substance use and tobacco use. Encouraged pt to attend follow up appointment.     Pt has close follow up with Vella Raring, NP Cardiology at [facility] location on 2/2.2023.   Pricilla Holm, MSN, RN Heart Failure Nurse Navigator [phone]

## 2021-04-16 NOTE — Progress Notes (Signed)
PROGRESS NOTE        PATIENT DETAILS Name: Morgan Bean Age: 56 y.o. Sex: female Date of Birth: 1965-11-23 Admit Date: 04/13/2021 Admitting Physician Jonetta Osgood, MD BOF:[id number], Charlane Ferretti, MD  Brief Summary: 56 year old with PAF, HFpEF, HTN, cocaine use-who presented with shortness of breath-patient was found to have A. fib with RVR and HFpEF exacerbation in the setting of ongoing cocaine use.  She was also found to have COVID-19 infection.  Significant events: 1/22>> admit for shortness of breath-after cocaine use-found to have A. fib RVR and HFpEF exacerbation.  Pertinent imaging studies: 1/22>> CXR: Vascular congestion/interstitial edema. 1/22>> CT head: No acute intracranial abnormality.  Subjective: Remains on room air but per nursing staff-short of breath with activity.  Objective: Vitals: Blood pressure 123/90, pulse 82, temperature 98.1 F (36.7 C), temperature source Oral, resp. rate (!) 24, height 5\' 6"  (1.676 m), weight 76.2 kg, last menstrual period 05/10/2013, SpO2 96 %.   Exam: Gen Exam:Alert awake-not in any distress HEENT:atraumatic, normocephalic Chest: Few bibasilar rales. CVS:S1S2 regular Abdomen:soft non tender, non distended Extremities:no edema Neurology: Non focal Skin: no rash   Pertinent Labs/Radiology: CBC Latest Ref Rng & Units 04/16/2021 04/15/2021 04/14/2021  WBC 4.0 - 10.5 K/uL 4.1 4.2 3.0(L)  Hemoglobin 12.0 - 15.0 g/dL 12.3 12.3 12.0  Hematocrit 36.0 - 46.0 % 37.7 38.4 37.6  Platelets 150 - 400 K/uL 162 144(L) 144(L)    Lab Results  Component Value Date   NA 136 04/16/2021   K 4.0 04/16/2021   CL 106 04/16/2021   CO2 20 (L) 04/16/2021      Assessment/Plan: * Persistent atrial fibrillation with RVR ([place])- (present on admission) Initially required Cardizem infusion-rate controlled with short acting Cardizem.  We will transition back to long-acting Cardizem today.  Remains on Xarelto.    Not a candidate  for beta-blocker given active cocaine use.  CHA2DS2-VASc of 4.  Reviewed last outpatient cardiology note-plans were for cardioversion-suspect can be done in the outpatient setting once she completely recovers from COVID-19 infection.  Acute on chronic diastolic CHF (congestive heart failure) ([place])- (present on admission) Although better than on initial presentation-she claims she is little short of breath today.  Chest x-ray shows improvement in pulm edema.  We will give 1 additional dose of IV Lasix today-she has already received oral Lasix dosing.  Have asked nursing staff to ambulate and see how she does. Echo on 1/23 with preserved EF of 60-65%.   COVID-19 virus infection- (present on admission) Continue monotherapy-remained stable on room air.  SOB on presentation was more likely from HFpEF exacerbation.  Essential hypertension- (present on admission) BP stable-continue lisinopril/Cardizem.  Diuretics on hold.    Hypokalemia Repleted.  Chronic kidney disease, stage 3a ([name]) Creatinine not far from baseline.  Depression, recurrent ([place])- (present on admission) Appears stable-apparently not taking Wellbutrin or Celexa any longer.  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  Cocaine abuse ([place])- (present on admission) Admits to use 1 day prior to this hospitalization-have counseled extensively  Rheumatic mitral regurgitation-resolved as of 04/15/2021 Moderate in past, will see if its progressed at all on echo.  Chest pain, mid sternal-resolved as of 04/14/2021, (present on admission) Due to cocaine + A.Fib RVR.  Also possibly COVID.   BMI: Estimated body mass index is 27.13 kg/m  as calculated from the following:   Height as of this encounter: 5\' 6"  (1.676 m).   Weight as of this encounter: 76.2 kg.    DVT Prophylaxis: Xarelto Procedures: None Consults: None Code Status:Full code   Family Communication: None at bedside   Disposition Plan: Status is: Observation  The patient will require care spanning > 2 midnights and should be moved to inpatient because: Overall better-still SOB with activity-given IV Lasix today.  Hopefully home in next day or so.   Diet: Diet Order             Diet Heart Room service appropriate? Yes; Fluid consistency: Thin  Diet effective now                     Antimicrobial agents: Anti-infectives (From admission, onward)    Start     Dose/Rate Route Frequency Ordered Stop   04/14/21 0215  molnupiravir EUA (LAGEVRIO) capsule 800 mg        4 capsule Oral 2 times daily 04/14/21 0208 04/18/21 2159        MEDICATIONS: Scheduled Meds:  arformoterol  15 mcg Nebulization BID   atorvastatin  40 mg Oral QHS   budesonide (PULMICORT) nebulizer solution  0.25 mg Nebulization BID   diltiazem  60 mg Oral Q6H   fluticasone  2 spray Each Nare Daily   furosemide  20 mg Intravenous Once   furosemide  40 mg Oral Daily   hydrOXYzine  50 mg Oral TID   lisinopril  10 mg Oral Daily   molnupiravir EUA  4 capsule Oral BID   pantoprazole  80 mg Oral Daily   potassium chloride SA  40 mEq Oral Daily   rivaroxaban  20 mg Oral Q supper   spironolactone  25 mg Oral Daily   umeclidinium bromide  1 puff Inhalation Daily   Continuous Infusions:   PRN Meds:.acetaminophen, levalbuterol, ondansetron (ZOFRAN) IV   I have personally reviewed following labs and imaging studies  LABORATORY DATA: CBC: Recent Labs  Lab 04/13/21 2240 04/14/21 0354 04/15/21 0324 04/16/21 0507  WBC 3.6* 3.0* 4.2 4.1  NEUTROABS 2.4 1.8 1.7 1.5*  HGB 12.1 12.0 12.3 12.3  HCT 37.4 37.6 38.4 37.7  MCV 85.4 85.1 85.3 84.3  PLT 143* 144* 144* 397    Basic Metabolic Panel: Recent Labs  Lab 04/13/21 2240 04/14/21 0354 04/15/21 0324 04/16/21 0507  NA 138 135 134* 136  K 3.9 3.4* 3.3* 4.0  CL 103 105 103 106  CO2 21* 21* 23 20*  GLUCOSE 109* 113* 102* 90   BUN 13 15 26* 19  CREATININE 1.24* 1.21* 1.99* 1.34*  CALCIUM 9.3 8.8* 8.4* 8.6*    GFR: Estimated Creatinine Clearance: 48.3 mL/min (A) (by C-G formula based on SCr of 1.34 mg/dL (H)).  Liver Function Tests: Recent Labs  Lab 04/14/21 0354 04/15/21 0324 04/16/21 0507  AST 49* 38 39  ALT 46* 34 33  ALKPHOS 80 69 57  BILITOT 1.3* 0.8 0.8  PROT 7.6 6.4* 6.4*  ALBUMIN 3.9 3.2* 3.2*   No results for input(s): LIPASE, AMYLASE in the last 168 hours. No results for input(s): AMMONIA in the last 168 hours.  Coagulation Profile: No results for input(s): INR, PROTIME in the last 168 hours.  Cardiac Enzymes: No results for input(s): CKTOTAL, CKMB, CKMBINDEX, TROPONINI in the last 168 hours.  BNP (last 3 results) Recent Labs    12/17/20 1044  PROBNP 2,714*    Lipid Profile: No  results for input(s): CHOL, HDL, LDLCALC, TRIG, CHOLHDL, LDLDIRECT in the last 72 hours.  Thyroid Function Tests: No results for input(s): TSH, T4TOTAL, FREET4, T3FREE, THYROIDAB in the last 72 hours.  Anemia Panel: No results for input(s): VITAMINB12, FOLATE, FERRITIN, TIBC, IRON, RETICCTPCT in the last 72 hours.  Urine analysis:    Component Value Date/Time   COLORURINE COLORLESS (A) 04/13/2021 2337   APPEARANCEUR CLEAR 04/13/2021 2337   LABSPEC 1.005 04/13/2021 2337   PHURINE 7.0 04/13/2021 2337   GLUCOSEU NEGATIVE 04/13/2021 2337   HGBUR SMALL (A) 04/13/2021 2337   BILIRUBINUR NEGATIVE 04/13/2021 2337   BILIRUBINUR negative 06/29/2018 0928   BILIRUBINUR NEG 04/23/2014 [place] 04/13/2021 2337   PROTEINUR NEGATIVE 04/13/2021 2337   UROBILINOGEN 0.2 06/29/2018 0928   UROBILINOGEN 0.2 09/20/2013 1805   NITRITE NEGATIVE 04/13/2021 2337   LEUKOCYTESUR NEGATIVE 04/13/2021 2337    Sepsis Labs: Lactic Acid, Venous    Component Value Date/Time   LATICACIDVEN 1.0 12/16/2018 1335    MICROBIOLOGY: Recent Results (from the past 240 hour(s))  Resp Panel by RT-PCR (Flu A&B,  Covid) Nasopharyngeal Swab     Status: Abnormal   Collection Time: 04/13/21 10:55 PM   Specimen: Nasopharyngeal Swab; Nasopharyngeal(NP) swabs in vial transport medium  Result Value Ref Range Status   SARS Coronavirus 2 by RT PCR POSITIVE (A) NEGATIVE Final    Comment: (NOTE) SARS-CoV-2 target nucleic acids are DETECTED.  The SARS-CoV-2 RNA is generally detectable in upper respiratory specimens during the acute phase of infection. Positive results are indicative of the presence of the identified virus, but do not rule out bacterial infection or co-infection with other pathogens not detected by the test. Clinical correlation with patient history and other diagnostic information is necessary to determine patient infection status. The expected result is Negative.  Fact Sheet for Patients: EntrepreneurPulse.com.au  Fact Sheet for Healthcare Providers: IncredibleEmployment.be  This test is not yet approved or cleared by the [place] FDA and  has been authorized for detection and/or diagnosis of SARS-CoV-2 by FDA under an Emergency Use Authorization (EUA).  This EUA will remain in effect (meaning this test can be used) for the duration of  the COVID-19 declaration under Section 564(b)(1) of the A ct, 21 U.S.C. section 360bbb-3(b)(1), unless the authorization is terminated or revoked sooner.     Influenza A by PCR NEGATIVE NEGATIVE Final   Influenza B by PCR NEGATIVE NEGATIVE Final    Comment: (NOTE) The Xpert Xpress SARS-CoV-2/FLU/RSV plus assay is intended as an aid in the diagnosis of influenza from Nasopharyngeal swab specimens and should not be used as a sole basis for treatment. Nasal washings and aspirates are unacceptable for Xpert Xpress SARS-CoV-2/FLU/RSV testing.  Fact Sheet for Patients: EntrepreneurPulse.com.au  Fact Sheet for Healthcare Providers: IncredibleEmployment.be  This test is not  yet approved or cleared by the [place] FDA and has been authorized for detection and/or diagnosis of SARS-CoV-2 by FDA under an Emergency Use Authorization (EUA). This EUA will remain in effect (meaning this test can be used) for the duration of the COVID-19 declaration under Section 564(b)(1) of the Act, 21 U.S.C. section 360bbb-3(b)(1), unless the authorization is terminated or revoked.  Performed at [facility], [place] [address], [place], [place] [address]     RADIOLOGY STUDIES/RESULTS: DG Chest Port 1V same Day  Result Date: 04/16/2021 CLINICAL DATA:  Shortness of breath EXAM: PORTABLE CHEST 1 VIEW COMPARISON:  Chest x-ray dated April 13, 2021 FINDINGS: Unchanged enlarged cardiac  contours. Mild bilateral interstitial opacities, decreased when compared to prior exam. No large pleural effusion or pneumothorax. IMPRESSION: Stable cardiomegaly.  Slightly decreased pulmonary edema. Electronically Signed   By: Yetta Glassman M.D.   On: 04/16/2021 09:45     LOS: 2 days   Oren Binet, MD  [facility]    To contact the attending provider between 7A-7P or the covering provider during after hours 7P-7A, please log into the web site [email or web address] and access using universal [place] password for that web site. If you do not have the password, please call the hospital operator.  04/16/2021, 9:58 AM

## 2021-04-17 ENCOUNTER — Other Ambulatory Visit: Payer: Self-pay

## 2021-04-17 LAB — COMPREHENSIVE METABOLIC PANEL
ALT: 35 U/L (ref 0–44)
AST: 38 U/L (ref 15–41)
Albumin: 3.3 g/dL — ABNORMAL LOW (ref 3.5–5.0)
Alkaline Phosphatase: 68 U/L (ref 38–126)
Anion gap: 9 (ref 5–15)
BUN: 17 mg/dL (ref 6–20)
CO2: 21 mmol/L — ABNORMAL LOW (ref 22–32)
Calcium: 9 mg/dL (ref 8.9–10.3)
Chloride: 108 mmol/L (ref 98–111)
Creatinine, Ser: 1.3 mg/dL — ABNORMAL HIGH (ref 0.44–1.00)
GFR, Estimated: 48 mL/min — ABNORMAL LOW (ref >60)
Glucose, Bld: 94 mg/dL (ref 70–99)
Potassium: 4.1 mmol/L (ref 3.5–5.1)
Sodium: 138 mmol/L (ref 135–145)
Total Bilirubin: 0.7 mg/dL (ref 0.3–1.2)
Total Protein: 6.8 g/dL (ref 6.5–8.1)

## 2021-04-17 LAB — CBC WITH DIFFERENTIAL/PLATELET
Abs Immature Granulocytes: 0 10*3/uL (ref 0.00–0.07)
Basophils Absolute: 0 10*3/uL (ref 0.0–0.1)
Basophils Relative: 1 %
Eosinophils Absolute: 0.1 10*3/uL (ref 0.0–0.5)
Eosinophils Relative: 2 %
HCT: 40.8 % (ref 36.0–46.0)
Hemoglobin: 13.3 g/dL (ref 12.0–15.0)
Lymphocytes Relative: 65 %
Lymphs Abs: 2.1 10*3/uL (ref 0.7–4.0)
MCH: 27.3 pg (ref 26.0–34.0)
MCHC: 32.6 g/dL (ref 30.0–36.0)
MCV: 83.8 fL (ref 80.0–100.0)
Monocytes Absolute: 0.4 10*3/uL (ref 0.1–1.0)
Monocytes Relative: 11 %
Neutro Abs: 0.7 10*3/uL — ABNORMAL LOW (ref 1.7–7.7)
Neutrophils Relative %: 21 %
Platelets: 163 10*3/uL (ref 150–400)
RBC: 4.87 MIL/uL (ref 3.87–5.11)
RDW: 15.3 % (ref 11.5–15.5)
WBC: 3.3 10*3/uL — ABNORMAL LOW (ref 4.0–10.5)
nRBC: 0 % (ref 0.0–0.2)
nRBC: 0 /100 WBC

## 2021-04-17 MED ORDER — FUROSEMIDE 40 MG PO TABS
40.0000 mg | ORAL_TABLET | Freq: Every day | ORAL | 2 refills | Status: DC
  Filled 2021-04-17: qty 30, 30d supply, fill #0
  Filled 2021-05-11 – 2021-05-26 (×2): qty 30, 30d supply, fill #1
  Filled 2021-06-23: qty 30, 30d supply, fill #2

## 2021-04-17 MED ORDER — MOLNUPIRAVIR EUA 200MG CAPSULE: 4.0000 | ORAL_CAPSULE | Freq: Two times a day (BID) | ORAL | 0 refills | Status: AC

## 2021-04-17 MED ORDER — DILTIAZEM HCL ER COATED BEADS 180 MG PO CP24
300.0000 mg | ORAL_CAPSULE | Freq: Every day | ORAL | Status: DC
  Administered 2021-04-17: 300 mg via ORAL
  Filled 2021-04-17: qty 1

## 2021-04-17 MED ORDER — ALBUTEROL SULFATE HFA 108 (90 BASE) MCG/ACT IN AERS
2.0000 | INHALATION_SPRAY | Freq: Four times a day (QID) | RESPIRATORY_TRACT | 0 refills | Status: DC | PRN
  Filled 2021-04-17: qty 18, 25d supply, fill #0

## 2021-04-17 NOTE — Plan of Care (Signed)

## 2021-04-17 NOTE — TOC Transition Note (Addendum)
Transition of Care [facility]) - CM/SW Discharge Note   Patient Details  Name: Tiffani Kadow MRN: [id number] Date of Birth: [birth year]  [facility] CM/SW Contact:  Zenon Mayo, RN Phone Number: 04/17/2021, 12:19 PM   Clinical Narrative:     Patient is for dc today, she states she has transportation home today, she lives with her friend and her spouse.  She does not need any DME.  Medications are being filled by [facility]. Staff RN will make sure patient goes home with remaining Covid medications. She has no other needs.    Final next level of care: Home/Self Care Barriers to Discharge: No Barriers Identified   Patient Goals and CMS Choice Patient states their goals for this hospitalization and ongoing recovery are:: return home   Choice offered to / list presented to : NA  Discharge Placement                       Discharge Plan and Services In-house Referral: NA Discharge Planning Services: CM Consult Post Acute Care Choice: NA            DME Agency: NA       HH Arranged: NA          Social Determinants of Health (SDOH) Interventions     Readmission Risk Interventions Readmission Risk Prevention Plan 04/17/2021  Transportation Screening Complete  PCP or Specialist Appt within 3-5 Days Complete  HRI or [place] Complete  Social Work Consult for [place] Planning/Counseling Complete  Palliative Care Screening Not Applicable  Medication Review [employer]) Complete  Some recent data might be hidden

## 2021-04-17 NOTE — Discharge Instructions (Signed)
Take COVID-19 medications as ordered and complete course of meds.

## 2021-04-17 NOTE — Discharge Summary (Signed)
PATIENT DETAILS Name: Morgan Bean Age: 56 y.o. Sex: female Date of Birth: [birth year] MRN: [id number]. Admitting Physician: Jonetta Osgood, MD [id number], Charlane Ferretti, MD  Admit Date: 04/13/2021 Discharge date: 04/17/2021  Recommendations for Outpatient Follow-up:  Follow up with PCP in 1-2 weeks Please obtain CMP/CBC in one week Please ensure follow-up with cardiology Please continue to counsel regarding importance of stopping cocaine use.   Admitted From:  Home  Disposition: [place]: No  Equipment/Devices: None  Discharge Condition: Stable  CODE STATUS: FULL CODE  Diet recommendation:  Diet Order             Diet - low sodium heart healthy           Diet Heart Room service appropriate? Yes; Fluid consistency: Thin  Diet effective now                    Brief Summary: 56 year old with PAF, HFpEF, HTN, cocaine use-who presented with shortness of breath-patient was found to have A. fib with RVR and HFpEF exacerbation in the setting of ongoing cocaine use.  She was also found to have COVID-19 infection.  Significant events: 1/22>> admit for shortness of breath-after cocaine use-found to have A. fib RVR and HFpEF exacerbation.  Pertinent imaging studies: 1/22>> CXR: Vascular congestion/interstitial edema. 1/22>> CT head: No acute intracranial abnormality. 1/23>> Echo: EF 60-65%.  Brief Hospital Course: * Persistent atrial fibrillation with RVR ([place])- (present on admission) Initially required Cardizem infusion-rate controlled with short acting Cardizem.  We will transition back to long-acting Cardizem today.  Remains on Xarelto.    Not a candidate for beta-blocker given active cocaine use.  CHA2DS2-VASc of 4.  Reviewed last outpatient cardiology note-plans were for cardioversion-suspect can be done in the outpatient setting once she completely recovers from COVID-19 infection.  Acute on chronic diastolic CHF (congestive heart failure) ([place])-  (present on admission) Significantly better-treated with IV furosemide.  She is back to baseline-this morning she feels that she is now much better and wants to go home.  Has been transitioned to oral furosemide/Aldactone.  Echo on 1/23 with preserved EF of 60-65%.   COVID-19 virus infection- (present on admission) Continue monotherapy x5 days total-remained stable on room air.  SOB on presentation was more likely from HFpEF exacerbation.  Essential hypertension- (present on admission) BP stable-continue lisinopril/Cardizem.    Hypokalemia Repleted.  Chronic kidney disease, stage 3a ([name]) Creatinine not far from baseline.  Depression, recurrent ([place])- (present on admission) Appears stable-apparently not taking Wellbutrin or Celexa any longer.  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  Cocaine abuse ([place])- (present on admission) Admits to use 1 day prior to this hospitalization-have counseled extensively   BMI: Estimated body mass index is 26.97 kg/m as calculated from the following:   Height as of this encounter: 5\' 6"  (1.676 m).   Weight as of this encounter: 75.8 kg.    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/23/18    Depression, recurrent ([name])   Chronic kidney disease, stage 3a ([place])   Cocaine abuse ([place])   Discharge Instructions:  Activity:  As tolerated with Full fall precautions use walker/cane & assistance as  needed   Discharge Instructions     Call MD for:  difficulty breathing, headache or visual disturbances   Complete by: As directed    Diet - low sodium heart healthy   Complete by: As directed     Discharge instructions   Complete by: As directed    Follow with Primary MD  [name], MD in 1-2 weeks  Please get a complete blood count and chemistry panel checked by your Primary MD at your next visit, and again as instructed by your Primary MD.  Get Medicines reviewed and adjusted: Please take all your medications with you for your next visit with your Primary MD  Laboratory/radiological data: Please request your Primary MD to go over all hospital tests and procedure/radiological results at the follow up, please ask your Primary MD to get all Hospital records sent to his/her office.  In some cases, they will be blood work, cultures and biopsy results pending at the time of your discharge. Please request that your primary care M.D. follows up on these results.  Also Note the following: If you experience worsening of your admission symptoms, develop shortness of breath, life threatening emergency, suicidal or homicidal thoughts you must seek medical attention immediately by calling 911 or calling your MD immediately  if symptoms less severe.  You must read complete instructions/literature along with all the possible adverse reactions/side effects for all the Medicines you take and that have been prescribed to you. Take any new Medicines after you have completely understood and accpet all the possible adverse reactions/side effects.   Do not drive when taking Pain medications or sleeping medications (Benzodaizepines)  Do not take more than prescribed Pain, Sleep and Anxiety Medications. It is not advisable to combine anxiety,sleep and pain medications without talking with your primary care practitioner  Special Instructions: If you have smoked or chewed Tobacco  in the last 2 yrs please stop smoking, stop any regular Alcohol  and or any Recreational drug use.  Wear Seat belts while driving.  Please note: You were cared for by a hospitalist during your hospital stay. Once you are  discharged, your primary care physician will handle any further medical issues. Please note that NO REFILLS for any discharge medications will be authorized once you are discharged, as it is imperative that you return to your primary care physician (or establish a relationship with a primary care physician if you do not have one) for your post hospital discharge needs so that they can reassess your need for medications and monitor your lab values.   Please stop doing further cocaine.  Please follow-up with cardiology clinic.  Please take your medications as prescribed.   Increase activity slowly   Complete by: As directed       Allergies as of 04/17/2021       Reactions   Ciprofloxacin Itching        Medication List     STOP taking these medications    buPROPion 150 MG 12 hr tablet Commonly known as: WELLBUTRIN SR   citalopram 40 MG tablet Commonly known as: CELEXA   ferrous sulfate 325 (65 FE) MG EC tablet   topiramate 25 MG tablet Commonly known as: TOPAMAX   traZODone 150 MG tablet Commonly known as: DESYREL       TAKE these medications    albuterol 108 (90 Base) MCG/ACT inhaler Commonly known as: VENTOLIN HFA Inhale 2 puffs into the lungs every 6 (six) hours as needed for wheezing or shortness  of breath.   atorvastatin 40 MG tablet Commonly known as: LIPITOR Take 1 tablet (40 mg total) by mouth at bedtime.   Cartia XT 300 MG 24 hr capsule Generic drug: diltiazem Take 1 capsule (300 mg total) by mouth daily.   fluticasone 50 MCG/ACT nasal spray Commonly known as: FLONASE Administer 2 sprays in each nostril daily.   furosemide 40 MG tablet Commonly known as: LASIX Take 1 tablet (40 mg total) by mouth daily. What changed: when to take this   hydrOXYzine 50 MG tablet Commonly known as: ATARAX Take 1 tablet (50 mg total) by mouth 3 (three) times daily.   lisinopril 10 MG tablet Commonly known as: ZESTRIL Take 1 tablet (10 mg total) by mouth daily.    molnupiravir EUA 200 mg Caps capsule Commonly known as: LAGEVRIO Take 4 capsules (800 mg total) by mouth 2 (two) times daily for 1 day.   omeprazole 40 MG capsule Commonly known as: PRILOSEC Take 1 capsule (40 mg total) by mouth daily. Take with water, wait 30-60 minutes after taking before eating or drinking anything but water   potassium chloride SA 20 MEQ tablet Commonly known as: KLOR-CON M Take 1 tablet (20 mEq total) by mouth daily.   rivaroxaban 20 MG Tabs tablet Commonly known as: Xarelto Take 1 tablet (20 mg total) by mouth daily with supper.   spironolactone 25 MG tablet Commonly known as: ALDACTONE Take 1 tablet (25 mg total) by mouth daily.        Follow-up Information     [name], MD. Go on 05/08/2021.   Specialty: Family Medicine Why: @10 :50am Contact information: [place] [address] [phone]         [name], MD Follow up in 1 week(s).   Specialty: Cardiology Contact information: [address] [place] [address] [phone]                Allergies  Allergen Reactions   Ciprofloxacin Itching    Consultations:  None   Other Procedures/Studies: DG Chest 2 View  Result Date: 04/13/2021 CLINICAL DATA:  Shortness of breath, chest pain EXAM: CHEST - 2 VIEW COMPARISON:  12/26/2020 FINDINGS: Cardiomegaly, vascular congestion. Interstitial prominence likely reflects interstitial edema. No effusions or acute bony abnormality. IMPRESSION: Cardiomegaly, vascular congestion, mild interstitial edema. Electronically Signed   By: Rolm Baptise M.D.   On: 04/13/2021 21:30   CT Head Wo Contrast  Result Date: 04/14/2021 CLINICAL DATA:  Headache. EXAM: CT HEAD WITHOUT CONTRAST TECHNIQUE: Contiguous axial images were obtained from the base of the skull through the vertex without intravenous contrast. RADIATION DOSE REDUCTION: This exam was performed according to the departmental dose-optimization  program which includes automated exposure control, adjustment of the mA and/or kV according to patient size and/or use of iterative reconstruction technique. COMPARISON:  08/06/2013 FINDINGS: Brain: There is atrophy and chronic small vessel disease changes. No acute intracranial abnormality. Specifically, no hemorrhage, hydrocephalus, mass lesion, acute infarction, or significant intracranial injury. Vascular: No hyperdense vessel or unexpected calcification. Skull: No acute calvarial abnormality. Sinuses/Orbits: No acute findings Other: None IMPRESSION: Atrophy, chronic microvascular disease. No acute intracranial abnormality. Electronically Signed   By: Rolm Baptise M.D.   On: 04/14/2021 00:21   DG Chest Port 1V same Day  Result Date: 04/16/2021 CLINICAL DATA:  Shortness of breath EXAM: PORTABLE CHEST 1 VIEW COMPARISON:  Chest x-ray dated April 13, 2021 FINDINGS: Unchanged enlarged cardiac contours. Mild bilateral interstitial opacities, decreased when compared to  prior exam. No large pleural effusion or pneumothorax. IMPRESSION: Stable cardiomegaly.  Slightly decreased pulmonary edema. Electronically Signed   By: Yetta Glassman M.D.   On: 04/16/2021 09:45   ECHOCARDIOGRAM COMPLETE  Result Date: 04/14/2021    ECHOCARDIOGRAM REPORT   Patient Name:   [facility] Date of Exam: 04/14/2021 Medical Rec #:  [id number]    Height:       66.0 in Accession #:    [id number]   Weight:       179.0 lb Date of Birth:  19-Mar-1966    BSA:          1.908 m Patient Age:    75 years     BP:           138/95 mmHg Patient Gender: F            HR:           87 bpm. Exam Location:  Inpatient Procedure: 2D Echo, Cardiac Doppler and Color Doppler Indications:    ACUTE CHF DIASTOLIC  History:        Patient has prior history of Echocardiogram examinations, most                 recent 12/25/2020. CHF, CAD, Stroke, Signs/Symptoms:Shortness of                 Breath; Risk Factors:Hypertension. TR / HLD.  Sonographer:    Beryle Beams  Referring Phys: [phone] JARED M GARDNER  Sonographer Comments: No subcostal window. IMPRESSIONS  1. Left ventricular ejection fraction, by estimation, is 60 to 65%. The left ventricle has normal function. The left ventricle has no regional wall motion abnormalities. Left ventricular diastolic parameters were normal.  2. Right ventricular systolic function is normal. The right ventricular size is normal.  3. Left atrial size was moderately dilated.  4. Right atrial size was moderately dilated.  5. The mitral valve is abnormal. Mild mitral valve regurgitation. No evidence of mitral stenosis.  6. The aortic valve is tricuspid. There is mild calcification of the aortic valve. Aortic valve regurgitation is moderate. Aortic valve sclerosis is present, with no evidence of aortic valve stenosis.  7. The inferior vena cava is normal in size with greater than 50% respiratory variability, suggesting right atrial pressure of 3 mmHg. FINDINGS  Left Ventricle: Left ventricular ejection fraction, by estimation, is 60 to 65%. The left ventricle has normal function. The left ventricle has no regional wall motion abnormalities. The left ventricular internal cavity size was normal in size. There is  no left ventricular hypertrophy. Left ventricular diastolic parameters were normal. Right Ventricle: IVC NOT SEEN. The right ventricular size is normal. No increase in right ventricular wall thickness. Right ventricular systolic function is normal. Left Atrium: Left atrial size was moderately dilated. Right Atrium: Right atrial size was moderately dilated. Pericardium: There is no evidence of pericardial effusion. Mitral Valve: The mitral valve is abnormal. There is mild thickening of the mitral valve leaflet(s). There is mild calcification of the mitral valve leaflet(s). Mild mitral valve regurgitation. No evidence of mitral valve stenosis. MV peak gradient, 96.4  mmHg. The mean mitral valve gradient is 62.0 mmHg. Tricuspid Valve: The  tricuspid valve is normal in structure. Tricuspid valve regurgitation is mild . No evidence of tricuspid stenosis. Aortic Valve: The aortic valve is tricuspid. There is mild calcification of the aortic valve. Aortic valve regurgitation is moderate. Aortic regurgitation PHT measures 1080 msec. Aortic valve sclerosis is present, with no evidence  of aortic valve stenosis. Aortic valve mean gradient measures 9.0 mmHg. Aortic valve peak gradient measures 17.0 mmHg. Aortic valve area, by VTI measures 0.76 cm. Pulmonic Valve: The pulmonic valve was normal in structure. Pulmonic valve regurgitation is not visualized. No evidence of pulmonic stenosis. Aorta: The aortic root is normal in size and structure. Venous: The inferior vena cava is normal in size with greater than 50% respiratory variability, suggesting right atrial pressure of 3 mmHg. IAS/Shunts: The interatrial septum was not well visualized.  LEFT VENTRICLE PLAX 2D LVIDd:         4.20 cm LVIDs:         3.00 cm LV PW:         1.40 cm LV IVS:        1.10 cm LVOT diam:     1.80 cm LV SV:         21 LV SV Index:   11 LVOT Area:     2.54 cm  LV Volumes (MOD) LV vol d, MOD A2C: 72.6 ml LV vol d, MOD A4C: 76.2 ml LV vol s, MOD A2C: 43.3 ml LV vol s, MOD A4C: 44.7 ml LV SV MOD A2C:     29.3 ml LV SV MOD A4C:     76.2 ml LV SV MOD BP:      33.3 ml RIGHT VENTRICLE RV S prime:     20.70 cm/s TAPSE (M-mode): 1.9 cm LEFT ATRIUM              Index        RIGHT ATRIUM           Index LA diam:        4.00 cm  2.10 cm/m   RA Area:     28.00 cm LA Vol (A2C):   136.0 ml 71.29 ml/m  RA Volume:   101.00 ml 52.94 ml/m LA Vol (A4C):   74.2 ml  38.89 ml/m LA Biplane Vol: 102.0 ml 53.47 ml/m  AORTIC VALVE                     PULMONIC VALVE AV Area (Vmax):    0.87 cm      PV Vmax:       0.68 m/s AV Area (Vmean):   0.79 cm      PV Vmean:      42.500 cm/s AV Area (VTI):     0.76 cm      PV VTI:        0.101 m AV Vmax:           206.00 cm/s   PV Peak grad:  1.8 mmHg AV Vmean:           133.000 cm/s  PV Mean grad:  1.0 mmHg AV VTI:            0.271 m AV Peak Grad:      17.0 mmHg AV Mean Grad:      9.0 mmHg LVOT Vmax:         70.20 cm/s LVOT Vmean:        41.400 cm/s LVOT VTI:          0.081 m LVOT/AV VTI ratio: 0.30 AI PHT:            1080 msec  AORTA Ao Root diam: 3.30 cm Ao Asc diam:  3.50 cm MITRAL VALVE              TRICUSPID VALVE MV  Area VTI:  0.15 cm    TV Peak grad:   22.1 mmHg MV Peak grad: 96.4 mmHg   TV Mean grad:   13.0 mmHg MV Mean grad: 62.0 mmHg   TV Vmax:        2.35 m/s MV Vmax:      4.91 m/s    TV Vmean:       170.0 cm/s MV Vmean:     370.0 cm/s  TV VTI:         0.59 msec                           TR Peak grad:   25.4 mmHg                           TR Vmax:        252.00 cm/s                            SHUNTS                           Systemic VTI:  0.08 m                           Systemic Diam: 1.80 cm Jenkins Rouge MD Electronically signed by Jenkins Rouge MD Signature Date/Time: 04/14/2021/9:40:16 AM    Final      TODAY-DAY OF DISCHARGE:  Subjective:   Morgan Bean today has no headache,no chest abdominal pain,no new weakness tingling or numbness, feels much better wants to go home today.   Objective:   Blood pressure (!) 144/111, pulse 98, temperature 98.7 F (37.1 C), temperature source Oral, resp. rate 20, height 5\' 6"  (1.676 m), weight 75.8 kg, last menstrual period 05/10/2013, SpO2 100 %.  Intake/Output Summary (Last 24 hours) at 04/17/2021 1158 Last data filed at 04/17/2021 0529 Gross per 24 hour  Intake 240 ml  Output 850 ml  Net -610 ml   Filed Weights   04/15/21 1535 04/16/21 0022 04/17/21 0524  Weight: 76.5 kg 76.2 kg 75.8 kg    Exam: Awake Alert, Oriented *3, No new F.N deficits, Normal affect [place].AT,PERRAL Supple Neck,No JVD, No cervical lymphadenopathy appriciated.  Symmetrical Chest wall movement, Good air movement bilaterally, CTAB RRR,No Gallops,Rubs or new Murmurs, No Parasternal Heave +ve B.Sounds, Abd Soft, Non tender, No  organomegaly appriciated, No rebound -guarding or rigidity. No Cyanosis, Clubbing or edema, No new Rash or bruise   PERTINENT RADIOLOGIC STUDIES: DG Chest Port 1V same Day  Result Date: 04/16/2021 CLINICAL DATA:  Shortness of breath EXAM: PORTABLE CHEST 1 VIEW COMPARISON:  Chest x-ray dated April 13, 2021 FINDINGS: Unchanged enlarged cardiac contours. Mild bilateral interstitial opacities, decreased when compared to prior exam. No large pleural effusion or pneumothorax. IMPRESSION: Stable cardiomegaly.  Slightly decreased pulmonary edema. Electronically Signed   By: Yetta Glassman M.D.   On: 04/16/2021 09:45     PERTINENT LAB RESULTS: CBC: Recent Labs    04/16/21 0507 04/17/21 0428  WBC 4.1 3.3*  HGB 12.3 13.3  HCT 37.7 40.8  PLT 162 163   CMET CMP     Component Value Date/Time   NA 138 04/17/2021 0428   NA 141 12/17/2020 1044   NA 141 03/06/2014 0759   K 4.1 04/17/2021 0428  K 4.0 03/06/2014 0759   CL 108 04/17/2021 0428   CO2 21 (L) 04/17/2021 0428   CO2 20 (L) 03/06/2014 0759   GLUCOSE 94 04/17/2021 0428   GLUCOSE 96 03/06/2014 0759   BUN 17 04/17/2021 0428   BUN 20 12/17/2020 1044   BUN 13.3 03/06/2014 0759   CREATININE 1.30 (H) 04/17/2021 0428   CREATININE 1.01 04/23/2014 1253   CREATININE 1.1 03/06/2014 0759   CALCIUM 9.0 04/17/2021 0428   CALCIUM 8.6 03/06/2014 0759   PROT 6.8 04/17/2021 0428   PROT 8.5 05/23/2018 1210   PROT 8.1 03/06/2014 0759   ALBUMIN 3.3 (L) 04/17/2021 0428   ALBUMIN 4.3 05/23/2018 1210   ALBUMIN 3.0 (L) 03/06/2014 0759   AST 38 04/17/2021 0428   AST 21 03/06/2014 0759   ALT 35 04/17/2021 0428   ALT 27 03/06/2014 0759   ALKPHOS 68 04/17/2021 0428   ALKPHOS 123 03/06/2014 0759   BILITOT 0.7 04/17/2021 0428   BILITOT 0.3 05/23/2018 1210   BILITOT 0.23 03/06/2014 0759   GFRNONAA 48 (L) 04/17/2021 0428   GFRNONAA 65 09/20/2013 1805   GFRAA 56 (L) 03/01/2019 1022   GFRAA 75 09/20/2013 1805    GFR Estimated Creatinine  Clearance: 49.7 mL/min (A) (by C-G formula based on SCr of 1.3 mg/dL (H)). No results for input(s): LIPASE, AMYLASE in the last 72 hours. No results for input(s): CKTOTAL, CKMB, CKMBINDEX, TROPONINI in the last 72 hours. Invalid input(s): POCBNP Recent Labs    04/15/21 0324  DDIMER 0.62*   No results for input(s): HGBA1C in the last 72 hours. No results for input(s): CHOL, HDL, LDLCALC, TRIG, CHOLHDL, LDLDIRECT in the last 72 hours. No results for input(s): TSH, T4TOTAL, T3FREE, THYROIDAB in the last 72 hours.  Invalid input(s): FREET3 No results for input(s): VITAMINB12, FOLATE, FERRITIN, TIBC, IRON, RETICCTPCT in the last 72 hours. Coags: No results for input(s): INR in the last 72 hours.  Invalid input(s): PT Microbiology: Recent Results (from the past 240 hour(s))  Resp Panel by RT-PCR (Flu A&B, Covid) Nasopharyngeal Swab     Status: Abnormal   Collection Time: 04/13/21 10:55 PM   Specimen: Nasopharyngeal Swab; Nasopharyngeal(NP) swabs in vial transport medium  Result Value Ref Range Status   SARS Coronavirus 2 by RT PCR POSITIVE (A) NEGATIVE Final    Comment: (NOTE) SARS-CoV-2 target nucleic acids are DETECTED.  The SARS-CoV-2 RNA is generally detectable in upper respiratory specimens during the acute phase of infection. Positive results are indicative of the presence of the identified virus, but do not rule out bacterial infection or co-infection with other pathogens not detected by the test. Clinical correlation with patient history and other diagnostic information is necessary to determine patient infection status. The expected result is Negative.  Fact Sheet for Patients: EntrepreneurPulse.com.au  Fact Sheet for Healthcare Providers: IncredibleEmployment.be  This test is not yet approved or cleared by the [place] FDA and  has been authorized for detection and/or diagnosis of SARS-CoV-2 by FDA under an Emergency Use  Authorization (EUA).  This EUA will remain in effect (meaning this test can be used) for the duration of  the COVID-19 declaration under Section 564(b)(1) of the A ct, 21 U.S.C. section 360bbb-3(b)(1), unless the authorization is terminated or revoked sooner.     Influenza A by PCR NEGATIVE NEGATIVE Final   Influenza B by PCR NEGATIVE NEGATIVE Final    Comment: (NOTE) The Xpert Xpress SARS-CoV-2/FLU/RSV plus assay is intended as an aid in the diagnosis of influenza  from Nasopharyngeal swab specimens and should not be used as a sole basis for treatment. Nasal washings and aspirates are unacceptable for Xpert Xpress SARS-CoV-2/FLU/RSV testing.  Fact Sheet for Patients: EntrepreneurPulse.com.au  Fact Sheet for Healthcare Providers: IncredibleEmployment.be  This test is not yet approved or cleared by the [place] FDA and has been authorized for detection and/or diagnosis of SARS-CoV-2 by FDA under an Emergency Use Authorization (EUA). This EUA will remain in effect (meaning this test can be used) for the duration of the COVID-19 declaration under Section 564(b)(1) of the Act, 21 U.S.C. section 360bbb-3(b)(1), unless the authorization is terminated or revoked.  Performed at [facility], [place] [address], [place], [place] [address]     FURTHER DISCHARGE INSTRUCTIONS:  Get Medicines reviewed and adjusted: Please take all your medications with you for your next visit with your Primary MD  Laboratory/radiological data: Please request your Primary MD to go over all hospital tests and procedure/radiological results at the follow up, please ask your Primary MD to get all Hospital records sent to his/her office.  In some cases, they will be blood work, cultures and biopsy results pending at the time of your discharge. Please request that your primary care M.D. goes through all the records of your hospital data and follows up on these  results.  Also Note the following: If you experience worsening of your admission symptoms, develop shortness of breath, life threatening emergency, suicidal or homicidal thoughts you must seek medical attention immediately by calling 911 or calling your MD immediately  if symptoms less severe.  You must read complete instructions/literature along with all the possible adverse reactions/side effects for all the Medicines you take and that have been prescribed to you. Take any new Medicines after you have completely understood and accpet all the possible adverse reactions/side effects.   Do not drive when taking Pain medications or sleeping medications (Benzodaizepines)  Do not take more than prescribed Pain, Sleep and Anxiety Medications. It is not advisable to combine anxiety,sleep and pain medications without talking with your primary care practitioner  Special Instructions: If you have smoked or chewed Tobacco  in the last 2 yrs please stop smoking, stop any regular Alcohol  and or any Recreational drug use.  Wear Seat belts while driving.  Please note: You were cared for by a hospitalist during your hospital stay. Once you are discharged, your primary care physician will handle any further medical issues. Please note that NO REFILLS for any discharge medications will be authorized once you are discharged, as it is imperative that you return to your primary care physician (or establish a relationship with a primary care physician if you do not have one) for your post hospital discharge needs so that they can reassess your need for medications and monitor your lab values.  Total Time spent coordinating discharge including counseling, education and face to face time equals 35 minutes.  SignedOren Binet 04/17/2021 11:58 AM

## 2021-04-17 NOTE — Plan of Care (Signed)
°  Problem: Education: Goal: Ability to demonstrate management of disease process will improve 04/17/2021 1411 by Serafina Mitchell, RN Outcome: Progressing 04/17/2021 1053 by Serafina Mitchell, RN Outcome: Progressing Goal: Ability to verbalize understanding of medication therapies will improve 04/17/2021 1411 by Serafina Mitchell, RN Outcome: Progressing 04/17/2021 1053 by Serafina Mitchell, RN Outcome: Progressing Goal: Individualized Educational Video(s) 04/17/2021 1411 by Serafina Mitchell, RN Outcome: Progressing 04/17/2021 1053 by Serafina Mitchell, RN Outcome: Progressing   Problem: Activity: Goal: Capacity to carry out activities will improve 04/17/2021 1411 by Serafina Mitchell, RN Outcome: Progressing 04/17/2021 1053 by Serafina Mitchell, RN Outcome: Progressing   Problem: Cardiac: Goal: Ability to achieve and maintain adequate cardiopulmonary perfusion will improve 04/17/2021 1411 by Serafina Mitchell, RN Outcome: Progressing 04/17/2021 1053 by Serafina Mitchell, RN Outcome: Progressing

## 2021-04-17 NOTE — TOC Initial Note (Signed)
Transition of Care [facility]) - Initial/Assessment Note    Patient Details  Name: Morgan Bean MRN: [id number] Date of Birth: [birth year]  [facility] CM/SW Contact:    Zenon Mayo, RN Phone Number: 04/17/2021, 12:18 PM  Clinical Narrative:                 Patient is for dc today, she states she has transportation home today, she lives with her friend and her spouse.  She does not need any DME.  Medications are being filled by [facility].  She has no other needs.  Expected Discharge Plan: Home/Self Care Barriers to Discharge: No Barriers Identified   Patient Goals and CMS Choice Patient states their goals for this hospitalization and ongoing recovery are:: return home   Choice offered to / list presented to : NA  Expected Discharge Plan and Services Expected Discharge Plan: Home/Self Care In-house Referral: NA Discharge Planning Services: CM Consult Post Acute Care Choice: NA   Expected Discharge Date: 04/17/21                 DME Agency: NA       HH Arranged: NA          Prior Living Arrangements/Services   Lives with:: Friends Patient language and need for interpreter reviewed:: Yes Do you feel safe going back to the place where you live?: Yes      Need for Family Participation in Patient Care: Yes (Comment) Care giver support system in place?: Yes (comment)   Criminal Activity/Legal Involvement Pertinent to Current Situation/Hospitalization: No - Comment as needed  Activities of Daily Living Home Assistive Devices/Equipment: None ADL Screening (condition at time of admission) Patient's cognitive ability adequate to safely complete daily activities?: Yes Is the patient deaf or have difficulty hearing?: No Does the patient have difficulty seeing, even when wearing glasses/contacts?: No Does the patient have difficulty concentrating, remembering, or making decisions?: No Patient able to express need for assistance with ADLs?: Yes Does the  patient have difficulty dressing or bathing?: Yes Independently performs ADLs?: Yes (appropriate for developmental age) Does the patient have difficulty walking or climbing stairs?: No Weakness of Legs: None Weakness of Arms/Hands: None  Permission Sought/Granted                  Emotional Assessment   Attitude/Demeanor/Rapport: Engaged Affect (typically observed): Appropriate Orientation: : Oriented to Self, Oriented to Place, Oriented to  Time, Oriented to Situation Alcohol / Substance Use: Not Applicable Psych Involvement: No (comment)  Admission diagnosis:  Atrial fibrillation with RVR ([place]) [I48.91] Altered mental status, unspecified altered mental status type [R41.82] Acute on chronic congestive heart failure, unspecified heart failure type ([place]) [I50.9] Patient Active Problem List   Diagnosis Date Noted   COVID-19 virus infection 04/14/2021   Hypokalemia 04/14/2021   Chronic kidney disease, stage 3a ([name]) 04/14/2021   Elevated troponin 12/25/2020   Dysphagia 12/25/2020   Acute on chronic congestive heart failure ([name])    Cocaine abuse ([place])    Atrial flutter with rapid ventricular response ([place]) 05/28/2020   Autoimmune disease ([place]) 12/27/2018   Laryngitis, chronic 12/26/2018   Febrile neutropenia ([place]) 12/16/2018   Chronic anticoagulation 10/14/2018   CRI (chronic renal insufficiency), stage 3 (moderate) ([place]) 10/14/2018   Upper airway cough syndrome 06/30/2018   Tobacco dependence in remission 04/11/2018   Substance abuse in remission ([place]) 04/11/2018   Depression, recurrent ([place]) 04/11/2018   PAF (paroxysmal atrial fibrillation) ([place]) 03/10/2018   Acute  pulmonary edema ([name])    Persistent atrial fibrillation with RVR ([name])    Nonrheumatic mitral valve regurgitation    Acute on chronic diastolic CHF (congestive heart failure) ([place]) 03/08/2018   Hypertensive urgency 03/08/2018   Acute respiratory failure with hypoxia ([place]) 03/08/2018   Unspecified atrial  flutter ([place]) 04/25/2016   Tobacco use 11/30/2015   Pancytopenia, acquired ([place]) 07/16/2015   Head and neck lymphadenopathy 07/16/2015   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 05/23/18  05/08/2015   Absolute anemia 02/25/2015   Preventive measure 12/27/2014   Primary stabbing headache 12/26/2014   History of stroke 12/26/2014   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   Headache 02/22/2014   Dental caries 01/09/2014   Cough 11/13/2013   Cigarette smoker 11/13/2013   Swelling of gums 11/13/2013   Fatigue 11/10/2013   Intracranial atherosclerosis 11/10/2013   Hyperreflexia 10/04/2013   TIA (transient ischemic attack) 10/04/2013   Fibroid uterus 09/14/2012   Perimenopause 09/14/2012   Chronic cough 08/12/2012   DOE (dyspnea on exertion) 08/12/2012   History of CVA (cerebrovascular accident) 08/09/2012   Pulmonary hypertension ([place]) 04/06/2012   Substance abuse ([place]) 04/06/2012   History of cocaine abuse ([place]) 11/16/2011   Major depressive disorder, recurrent episode with psychotic features. 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   PCP:  [name], MD Pharmacy:   [facility] [place] [address] Phone: [phone] Fax: [phone]  [place] [facility] [place] [employer], [place] [address] Phone: [phone] Fax: [phone]     Social Determinants of Health (SDOH) Interventions    Readmission Risk Interventions Readmission Risk Prevention Plan 04/17/2021  Transportation Screening Complete  PCP or Specialist Appt within 3-5 Days Complete  HRI or [place] Complete  Social Work Consult for [place]  Planning/Counseling Complete  Palliative Care Screening Not Applicable  Medication Review Press photographer) Complete  Some recent data might be hidden

## 2021-04-18 ENCOUNTER — Other Ambulatory Visit: Payer: Self-pay

## 2021-04-18 ENCOUNTER — Telehealth: Payer: Self-pay

## 2021-04-18 NOTE — Telephone Encounter (Signed)
Transition Care Management Follow-up Telephone Call   Date of discharge and from where:[facility] on 05/18/2021 How have you been since you were released from the hospital? Pt stated much better  Any questions or concerns? No questions/concerns reported.  Items Reviewed: Did the pt receive and understand the discharge instructions provided? have the instructions and have no questions.  Medications obtained and verified? She said she have the medication list  and the hospital staff reviewed them in detail prior to discharge. She said that she  has all of the medications and they have no questions.  Any new allergies since your discharge? None reported  Do you have support at home? Yes, friends Other (ie: DME, Home Health, etc)        Functional Questionnaire: (I = Independent and D = Dependent) ADL's:  Independent.        Follow up appointments reviewed:   PCP Hospital f/u appt confirmed? Roney Jaffe Clung on 05/08/2021.  Specialist Hospital f/u appt confirmed? scheduled at this time  Are transportation arrangements needed? have transportation   If their condition worsens, is the pt aware to call  their PCP or go to the ED? 57 Was the patient provided with contact information for the PCP's office or ED? sHe has the phone number  Was the pt encouraged to call back with questions or concerns?yes

## 2021-04-24 ENCOUNTER — Encounter (HOSPITAL_BASED_OUTPATIENT_CLINIC_OR_DEPARTMENT_OTHER): Payer: Self-pay

## 2021-04-24 ENCOUNTER — Ambulatory Visit (HOSPITAL_BASED_OUTPATIENT_CLINIC_OR_DEPARTMENT_OTHER): Payer: Medicaid Other | Admitting: Family

## 2021-04-24 NOTE — Progress Notes (Deleted)
Office Visit    Patient Name: Morgan Bean Date of Encounter: 04/24/2021  PCP:  [name], MD   [place]  Cardiologist:  [name], MD  Advanced Practice Provider:  No care team member to display Electrophysiologist:  None      Chief Complaint    Morgan Bean is a 56 y.o. female with a hx of left atrial appendage thrombus, atrial flutter post ablation with recurrent atrial flutter/fib, cocaine use, anxiety, hypertension CKDIIIa, chronic diastolic heart failure, prior CVA, mild pulmonary hypertension, COPD, OSA not on CPAP, depression presents today for follow-up after hospitalization in atrial fibrillation.  Past Medical History    Past Medical History:  Diagnosis Date   Anemia of other chronic disease 11/13/2013   Anginal pain ([place])    none in past year   Anxiety    Asthma    patient denies   Candidiasis 06/23/2013   Chronic diastolic CHF (congestive heart failure) ([name])    Constipation    Coronary artery disease    Lexiscan myoview (10/12) with significant ST depression upon Lexiscan injection but no ischemia or infarction on perfusion images. Left heart cath (10/12): 30% mid LAD, 30% ostial D1, 50% ostial D2.     Cough 11/13/2013   Depression    Headache 02/22/2014   Hx of cardiovascular stress test    Lexiscan Myoview (2/16): Breast attenuation artifact, no ischemia, EF 64%; low risk   Hyperlipemia    Hypertension    Iron deficiency    hx of   Leukocytoclastic vasculitis ([name])    Rash across lower body, occurred in 9/11, diagnosed by skin biopsy. ANA positive. Thought to be secondary to cocaine use.   Leukopenia 03/21/2013   Lymphadenopathy 03/21/2013   Lymphadenopathy 01/30/2014   Mental disorder    Pancytopenia ([place])    Pancytopenia, acquired ([place]) 07/16/2015   Polysubstance abuse ([place])    Prior cocaine   Pulmonary HTN ([place])    Echo (9/11) with EF 65%, mild LVH, mild AI, mild MR, moderate to possibly severe TR with PA  systolic pressure 52 mmHg. Echo (10/12): severe LV hypertrophy, EF 60-65%, mild MR, mild AI, moderate to severe tricuspid regurgitation, PA systolic pressure 38 mmHg.  Echo 4/14: moderate LVH, EF 65%, normal wall motion, diastolic dysfunction, mild AI, mild MR   Shortness of breath    a. PFTs 5/14:  FEV1/FVC 99% predicted; FVC 60% predicted; DLCO mildly reduced, mild restriction, air trapping.;  b.  seen by pulmo (Dr. [name]) 5/14: mainly upper airway symptoms - ACE d/c'd (sx's better)   Stroke [facility]) 2010ish   Syphilis 04/25/2013   Tobacco abuse    Tricuspid regurgitation    Unspecified deficiency anemia 03/29/2013   Past Surgical History:  Procedure Laterality Date   ANKLE SURGERY     right   BONE MARROW ASPIRATION     CARDIAC CATHETERIZATION     CARDIAC CATHETERIZATION N/A 04/23/2015   Procedure: Right Heart Cath;  Surgeon: [name], MD;  Location: [place] CV LAB;  Service: Cardiovascular;  Laterality: N/A;   CARDIOVERSION N/A 03/11/2018   Procedure: CARDIOVERSION;  Surgeon: [name], MD;  Location: [facility] ENDOSCOPY;  Service: Cardiovascular;  Laterality: N/A;   I & D EXTREMITY  08/09/2011   Procedure: IRRIGATION AND DEBRIDEMENT EXTREMITY;  Surgeon: [name], MD;  Location: [place];  Service: General;  Laterality: Left;  i & D Left axilla abscess   LYMPH NODE BIOPSY N/A 04/05/2013  Procedure: EXCISIONAL BIOPSY RIGHT SUBMANDIBULAR NODE, NASAL ENDOSCOPIC WITH BIOPSY NASAL PHARYNX;  Surgeon: [name], MD;  Location: [place];  Service: ENT;  Laterality: N/A;   MULTIPLE EXTRACTIONS WITH ALVEOLOPLASTY N/A 03/03/2019   Procedure: MULTIPLE EXTRACTION WITH ALVEOLOPLASTY;  Surgeon: [name], DDS;  Location: [place];  Service: Oral Surgery;  Laterality: N/A;   TEE WITHOUT CARDIOVERSION N/A 03/11/2018   Procedure: TRANSESOPHAGEAL ECHOCARDIOGRAM (TEE);  Surgeon: [name], MD;  Location: [facility];  Service: Cardiovascular;  Laterality: N/A;   TEE WITHOUT CARDIOVERSION N/A  12/27/2020   Procedure: TRANSESOPHAGEAL ECHOCARDIOGRAM (TEE);  Surgeon: [name], DO;  Location: [facility] ENDOSCOPY;  Service: Cardiovascular;  Laterality: N/A;    Allergies  Allergies  Allergen Reactions   Ciprofloxacin Itching    History of Present Illness    Morgan Bean is a 56 y.o. female with a hx of left atrial appendage thrombus, atrial flutter post ablation with recurrent atrial flutter/fib, cocaine use, anxiety, hypertension CKDIIIa, chronic diastolic heart failure, prior CVA, mild pulmonary hypertension, COPD, OSA not on CPAP, depression last seen while hospitalized.  Admitted 02/2018 with acute on chronic diastolic heart failure and atrial fib/flutter with RVR. Echo with LVEF 50-55%, mild AI, moderate to severe MR. Diltiazem and Xarelto started.   ED visit 04/2018 due to atrial flutter. Cardiac enzymes unremarkable, CXR normal, urine tox positive for amphetamines. She had cardioversion but did not maintain NSR. She was subsequently started on Amiodarone and maintained NSR. Last seen in clinic 12/2018.  She was readmitted 12/2020 with community acquired pneumonia and atrial flutter. Positive for cocaine on admission. TEE showed left atrial appendage thrombus so cardioversion could not be pursued. LVEF 55-60%, RV normal size/function, mild to moderate MR, moderate AI. She was discharged on Xarelto, Amiodarone, Diltiazem with plans to schedule outpatient cardioversion.   She was seen in follow-up 04/10/2021.  She had moved in with friends to a safer environment with her mother given her multiple medications for greater than 1 month.  Diltiazem 300 mg daily and Xarelto 20 mg daily were refilled as well as her antihypertensive regimen.  She was readmitted 04/13/21 - 04/17/21 after presenting with shortness of breath and found to be in A. fib with RVR and HFpEF exacerbation in the setting of ongoing cocaine use.  Not a candidate for beta-blocker due to cocaine use.  She was transitioned to oral  furosemide/Aldactone after IV diuresis.  Echo 03/2021 with preserved LVEF 60 to 65%.  She also required treatment COVID-19 virus which was present on admission.   ***  EKGs/Labs/Other Studies Reviewed:   The following studies were reviewed today:  Echo 03/2021  1. Left ventricular ejection fraction, by estimation, is 60 to 65%. The  left ventricle has normal function. The left ventricle has no regional  wall motion abnormalities. Left ventricular diastolic parameters were  normal.   2. Right ventricular systolic function is normal. The right ventricular  size is normal.   3. Left atrial size was moderately dilated.   4. Right atrial size was moderately dilated.   5. The mitral valve is abnormal. Mild mitral valve regurgitation. No  evidence of mitral stenosis.   6. The aortic valve is tricuspid. There is mild calcification of the  aortic valve. Aortic valve regurgitation is moderate. Aortic valve  sclerosis is present, with no evidence of aortic valve stenosis.   7. The inferior vena cava is normal in size with greater than 50%  respiratory variability, suggesting right atrial pressure of 3 mmHg.  TEE 12/27/20  1. Left ventricular ejection fraction, by estimation, is 55 to 60%. The  left ventricle has normal function.   2. Right ventricular systolic function is normal. The right ventricular  size is normal.   3. A left atrial/left atrial appendage thrombus was detected. The LAA  emptying velocity was 22 cm/s.   4. The mitral valve is normal in structure. Mild to moderate mitral valve  regurgitation.   5. The aortic valve is tricuspid. Aortic valve regurgitation is moderate.   Echo 12/25/20  1. Left ventricular ejection fraction, by estimation, is 60 to 65%. The  left ventricle has normal function. The left ventricle has no regional  wall motion abnormalities. There is mild left ventricular hypertrophy.  Left ventricular diastolic function  could not be evaluated.   2. Right  ventricular systolic function is normal. The right ventricular  size is normal. There is normal pulmonary artery systolic pressure. The  estimated right ventricular systolic pressure is 42.5 mmHg.   3. Left atrial size was severely dilated.   4. Right atrial size was severely dilated.   5. The mitral valve is abnormal. Moderate mitral valve regurgitation.  Moderate mitral annular calcification.   6. The tricuspid valve is abnormal. Tricuspid valve regurgitation is  moderate.   7. The aortic valve is tricuspid. Aortic valve regurgitation is moderate.  Mild aortic valve sclerosis is present, with no evidence of aortic valve  stenosis. Aortic regurgitation PHT measures 498 msec.   8. The inferior vena cava is normal in size with <50% respiratory  variability, suggesting right atrial pressure of 8 mmHg.   Comparison(s): Changes from prior study are noted. 01/25/2019: LVEF 60-65%,  mild MR, mild to moderate AI, mild TR.   EKG:  EKG is ordered today.  The ekg ordered today demonstrates atrial fibrillation 140 bpm with RVR and PVC's. ***  Recent Labs: 12/17/2020: NT-Pro BNP 2,714 12/25/2020: TSH 2.038 12/27/2020: Magnesium 2.2 04/13/2021: B Natriuretic Peptide 1,212.6 04/17/2021: ALT 35; BUN 17; Creatinine, Ser 1.30; Hemoglobin 13.3; Platelets 163; Potassium 4.1; Sodium 138  Recent Lipid Panel    Component Value Date/Time   CHOL 152 12/26/2020 0232   TRIG 51 12/26/2020 0232   HDL 48 12/26/2020 0232   CHOLHDL 3.2 12/26/2020 0232   VLDL 10 12/26/2020 0232   LDLCALC 94 12/26/2020 0232   LDLDIRECT 142.1 12/25/2010 0846    Risk Assessment/Calculations:   CHA2DS2-VASc Score = 4   This indicates a 4.8% annual risk of stroke. The patient's score is based upon: CHF History: 1 HTN History: 1 Diabetes History: 0 Stroke History: 0 Vascular Disease History: 1 Age Score: 0 Gender Score: 1    Home Medications   No outpatient medications have been marked as taking for the 04/24/21 encounter  (Appointment) with Loel Dubonnet, NP.     Review of Systems     All other systems reviewed and are otherwise negative except as noted above.  Physical Exam    VS:  LMP 05/10/2013  , BMI There is no height or weight on file to calculate BMI.  Wt Readings from Last 3 Encounters:  04/17/21 167 lb 1.7 oz (75.8 kg)  04/10/21 179 lb (81.2 kg)  12/28/20 166 lb 10.7 oz (75.6 kg)    *** GEN: Well nourished, well developed, in no acute distress. HEENT: normal. Neck: Supple, no JVD, carotid bruits, or masses. Cardiac: tachycardic, irregularly irregular, no  rubs, or gallops. No clubbing, cyanosis, edema. Gr2/6 murmur.  Radials/PT 2+ and equal bilaterally.  Respiratory:  Respirations regular and unlabored, clear to auscultation bilaterally. GI: Soft, nontender, nondistended. MS: No deformity or atrophy. Skin: Warm and dry, no rash. Neuro:  Strength and sensation are intact. Psych: Normal affect.  Assessment & Plan    Atrial flutter / LAA thrombus - Ablative therapy in 2018. Admission 12/2020 with recurrent atrial flutter. Diltiazem dose adjusted and Amiodarone added. Has not taken Diltiazem in at least 1 month. She endorses compliance with Xarelto however pharmacy last filled 30 day supply 02/24/21 which is >1 month ago. Low suspicion she has completed 3 weeks of uninterrupted therapy and given known LAA thrombus will not schedule cardioversion today. As she has not been taking Amiodarone, is in atrial fibrillation, not completed 3 weeks of [place], will not resume at this time. We have refilled Diltiazem 300mg  QD and Xarelto 20mg  QD. Long discussion regarding importance of regular medications. Initial HR 140 bpm today in clinic and she was provided Metoprolol tartrate 50mg  which improved her HR to 92 bpm. She was instructed to pick her medications up and start today. Prompt follow up in 2 weeks. If she remains in atrial fibrillation an outpatient cardioversion could be scheduled at that time so  long as she completes 3 weeks of anticoagulation. Likely will plan for TEE with DCCV to reassess LAA thrombus. CMP, TSH, CBC ordered today. ***  HTN - BP not at goal in setting of being out of multiple medications. I have asked her to bring her pill bottles to her next appointment. BP goal <130/80. We have provided refills - her antihypertensive regimen should include Spironolactone 74m QD, Lisinopril 10mg  QD, Diltiazem 300mg  QD, Lasix 40mg  QD. If needed in the future, Lisinopril can be up-titrated.***  Cocaine use - Complete cessation encouraged. ***  CKDIIIa - Careful titration of diuretic and antihypertensive.  CMP today. ***  Diastolic heart failure - Exacerbated by **  Disposition: Follow up in 2 week(s) ***with [name], MD or APP.  Signed, Loel Dubonnet, NP 04/24/2021, 7:48 AM [place]

## 2021-05-07 NOTE — Progress Notes (Signed)
Patient ID: Morgan Bean, female   DOB: Nov 12, 1965, 56 y.o.   MRN: [id number]    Morgan Bean, is a 56 y.o. female  [id number]  [id number]  DOB - 12-Apr-1965  Chief Complaint  Patient presents with   Hospitalization Follow-up       Subjective:   Morgan Bean is a 56 y.o. female here today for a follow up visit After hospitalization 1/22-1/26/2023.  She says she is taking all her meds, but some show at the pharmacy as never being dispensed.  Sees cardiology on 2/22.  Denies SOB/CP.  Feeling better.  Still using cocaine but "not as much."  Her daughter is with her. She was in 12 step recovery in the past.    From discharge summary: Recommendations for Outpatient Follow-up:  Follow up with PCP in 1-2 weeks Please obtain CMP/CBC in one week Please ensure follow-up with cardiology Please continue to counsel regarding importance of stopping cocaine use.   Brief Summary: 56 year old with PAF, HFpEF, HTN, cocaine use-who presented with shortness of breath-patient was found to have A. fib with RVR and HFpEF exacerbation in the setting of ongoing cocaine use.  She was also found to have COVID-19 infection.   Significant events: 1/22>> admit for shortness of breath-after cocaine use-found to have A. fib RVR and HFpEF exacerbation.   Pertinent imaging studies: 1/22>> CXR: Vascular congestion/interstitial edema. 1/22>> CT head: No acute intracranial abnormality. 1/23>> Echo: EF 60-65%.   Brief Hospital Course: * Persistent atrial fibrillation with RVR ([name])- (present on admission) Initially required Cardizem infusion-rate controlled with short acting Cardizem.  We will transition back to long-acting Cardizem today.  Remains on Xarelto.    Not a candidate for beta-blocker given active cocaine use.  CHA2DS2-VASc of 4.  Reviewed last outpatient cardiology note-plans were for cardioversion-suspect can be done in the outpatient setting once she completely recovers from COVID-19 infection.    Acute on chronic diastolic CHF (congestive heart failure) ([name])- (present on admission) Significantly better-treated with IV furosemide.  She is back to baseline-this morning she feels that she is now much better and wants to go home.  Has been transitioned to oral furosemide/Aldactone.  Echo on 1/23 with preserved EF of 60-65%.    COVID-19 virus infection- (present on admission) Continue monotherapy x5 days total-remained stable on room air.  SOB on presentation was more likely from HFpEF exacerbation.   Essential hypertension- (present on admission) BP stable-continue lisinopril/Cardizem.     Hypokalemia Repleted.   Chronic kidney disease, stage 3a ([name]) Creatinine not far from baseline.   Depression, recurrent ([place])- (present on admission) Appears stable-apparently not taking Wellbutrin or Celexa any longer.   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   Cocaine abuse ([name])- (present on admission) Admits to use 1 day prior to this hospitalization-have counseled extensively     BMI: Estimated body mass index is 26.97 kg/m as calculated from the following:   Height as of this encounter: 5\' 6"  (1.676 m).   Weight as of this encounter: 75.8 kg.      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 Patient has No headache, No chest pain, No abdominal pain - No Nausea, No new weakness tingling or numbness, No Cough - SOB.  No problems updated.  ALLERGIES: Allergies  Allergen Reactions   Ciprofloxacin Itching    PAST  MEDICAL HISTORY: Past Medical History:  Diagnosis Date   Anemia of other chronic disease 11/13/2013   Anginal pain ([place])    none in past year   Anxiety    Asthma    patient denies   Candidiasis 06/23/2013   Chronic diastolic CHF (congestive heart failure) ([name])    Constipation    Coronary artery disease    Lexiscan myoview (10/12) with significant ST depression upon Lexiscan injection but no ischemia or infarction on perfusion images. Left heart cath (10/12): 30% mid LAD, 30% ostial D1, 50% ostial D2.     Cough 11/13/2013   Depression    Headache 02/22/2014   Hx of cardiovascular stress test    Lexiscan Myoview (2/16): Breast attenuation artifact, no ischemia, EF 64%; low risk   Hyperlipemia    Hypertension    Iron deficiency    hx of   Leukocytoclastic vasculitis ([name])    Rash across lower body, occurred in 9/11, diagnosed by skin biopsy. ANA positive. Thought to be secondary to cocaine use.   Leukopenia 03/21/2013   Lymphadenopathy 03/21/2013   Lymphadenopathy 01/30/2014   Mental disorder    Pancytopenia ([place])    Pancytopenia, acquired ([place]) 07/16/2015   Polysubstance abuse ([place])    Prior cocaine   Pulmonary HTN ([place])    Echo (9/11) with EF 65%, mild LVH, mild AI, mild MR, moderate to possibly severe TR with PA systolic pressure 52 mmHg. Echo (10/12): severe LV hypertrophy, EF 60-65%, mild MR, mild AI, moderate to severe tricuspid regurgitation, PA systolic pressure 38 mmHg.  Echo 4/14: moderate LVH, EF 65%, normal wall motion, diastolic dysfunction, mild AI, mild MR   Shortness of breath    a. PFTs 5/14:  FEV1/FVC 99% predicted; FVC 60% predicted; DLCO mildly reduced, mild restriction, air trapping.;  b.  seen by pulmo (Dr. [name]) 5/14: mainly upper airway symptoms - ACE d/c'd (sx's better)   Stroke [facility]) 2010ish   Syphilis 04/25/2013   Tobacco abuse    Tricuspid regurgitation    Unspecified deficiency anemia 03/29/2013    MEDICATIONS AT HOME: Prior to Admission medications    Medication Sig Start Date End Date Taking? Authorizing Provider  atorvastatin (LIPITOR) 40 MG tablet Take 1 tablet (40 mg total) by mouth at bedtime. 04/10/21  Yes Loel Dubonnet, NP  diltiazem (CARDIZEM CD) 300 MG 24 hr capsule Take 1 capsule (300 mg total) by mouth daily. 04/10/21 10/07/21 Yes Loel Dubonnet, NP  fluticasone (FLONASE) 50 MCG/ACT nasal spray Administer 2 sprays in each nostril daily. 01/10/21  Yes   furosemide (LASIX) 40 MG tablet Take 1 tablet (40 mg total) by mouth daily. 04/17/21  Yes Ghimire, Henreitta Leber, MD  lisinopril (ZESTRIL) 10 MG tablet Take 1 tablet (10 mg total) by mouth daily. 04/10/21 10/07/21 Yes Loel Dubonnet, NP  omeprazole (PRILOSEC) 40 MG capsule Take 1 capsule (40 mg total) by mouth daily. Take with water, wait 30-60 minutes after taking before eating or drinking anything but water 01/10/21  Yes   potassium chloride SA (KLOR-CON M) 20 MEQ tablet Take 1 tablet (20 mEq total) by mouth daily. 04/10/21 10/07/21 Yes Loel Dubonnet, NP  rivaroxaban (XARELTO) 20 MG TABS tablet Take 1  tablet (20 mg total) by mouth daily with supper. 04/10/21  Yes Loel Dubonnet, NP  albuterol (VENTOLIN HFA) 108 (90 Base) MCG/ACT inhaler Inhale 2 puffs into the lungs every 6 (six) hours as needed for wheezing or shortness of breath. 05/08/21   Argentina Donovan, PA-C  hydrOXYzine (ATARAX) 50 MG tablet Take 1 tablet (50 mg total) by mouth 3 (three) times daily. 05/08/21   Argentina Donovan, PA-C  spironolactone (ALDACTONE) 25 MG tablet Take 1 tablet (25 mg total) by mouth daily. 05/08/21 06/07/21  Argentina Donovan, PA-C    ROS: Neg HEENT Neg resp Neg cardiac Neg GI Neg GU Neg MS Neg psych Neg neuro  Objective:   Vitals:   05/08/21 1047  BP: (!) 141/93  Pulse: 84  SpO2: 95%  Weight: 175 lb 4 oz (79.5 kg)  Height: 5\' 6"  (1.676 m)   Exam General appearance : Awake, alert, not in any distress. Speech with mumbling at times but overall clear.  TP scattered. Not toxic  looking HEENT: Atraumatic and Normocephalic Neck: Supple, no JVD. No cervical lymphadenopathy.  Chest: Good air entry bilaterally, CTAB.  No rales/rhonchi/wheezing CVS: S1 S2 regular, no murmurs.  Extremities: B/L Lower Ext shows no edema, both legs are warm to touch Neurology: Awake alert, and oriented X 3, CN II-XII intact, Non focal Skin: No Rash  Data Review Lab Results  Component Value Date   HGBA1C 5.2 12/26/2020   HGBA1C 5.4 08/21/2015   HGBA1C 5.2 10/04/2013    Assessment & Plan   1. Hypertensive heart disease with chronic combined systolic and diastolic congestive heart failure ([name]) Continue regimen prescribed on hospital discharge papers - CBC with Differential/Platelet - Comprehensive metabolic panel - spironolactone (ALDACTONE) 25 MG tablet; Take 1 tablet (25 mg total) by mouth daily.  Dispense: 90 tablet; Refill: 0  2. COVID-19 virus infection resolved  3. Cocaine abuse ([place]) [place].org is the website for narcotics anonymous [place] (website) or 272-549-5872 is the information for alcoholics anonymous Both are free and immediately available for help with alcohol and drug use I have counseled the patient at length about substance abuse and addiction.  12 step meetings/recovery recommended.  Local 12 step meeting lists were given and attendance was encouraged.  Patient expresses understanding.  - hydrOXYzine (ATARAX) 50 MG tablet; Take 1 tablet (50 mg total) by mouth 3 (three) times daily.  Dispense: 30 tablet; Refill: 1  4. Chronic kidney disease, stage 3a ([place]) Continue regimen prescribed on hospital discharge papers  5. 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  Dispense: 18 g; Refill: 0  6. Persistent atrial  fibrillation ([name]) Continue regimen prescribed on hospital discharge papers - spironolactone (ALDACTONE) 25 MG tablet; Take 1 tablet (25 mg total) by mouth daily.  Dispense: 90 tablet; Refill: 0  7. Hospital follow up-pick up all meds    Patient have been counseled extensively about nutrition and exercise. Other issues discussed during this visit include: low cholesterol diet, weight control and daily exercise, foot care, annual eye examinations at Ophthalmology, importance of adherence with medications and regular follow-up. We also discussed long term complications of uncontrolled diabetes and hypertension.   Return in about 3 months (around 08/05/2021) for PCP for chronic conditions.  The patient was given clear instructions to  go to ER or return to medical center if symptoms don't improve, worsen or new problems develop. The patient verbalized understanding. The patient was told to call to get lab results if they haven't heard anything in the next week.      Freeman Caldron, PA-C [facility] [place], [place]   05/08/2021, 11:14 AM

## 2021-05-08 ENCOUNTER — Other Ambulatory Visit: Payer: Self-pay

## 2021-05-08 ENCOUNTER — Ambulatory Visit: Payer: Medicaid Other | Attending: Physician Assistant | Admitting: Physician Assistant

## 2021-05-08 ENCOUNTER — Encounter: Payer: Self-pay | Admitting: Physician Assistant

## 2021-05-08 VITALS — BP 141/93 | HR 84 | Ht 66.0 in | Wt 175.2 lb

## 2021-05-08 DIAGNOSIS — I110 Hypertensive heart disease with heart failure: Secondary | ICD-10-CM

## 2021-05-08 DIAGNOSIS — N1831 Chronic kidney disease, stage 3a: Secondary | ICD-10-CM | POA: Diagnosis not present

## 2021-05-08 DIAGNOSIS — J449 Chronic obstructive pulmonary disease, unspecified: Secondary | ICD-10-CM | POA: Diagnosis not present

## 2021-05-08 DIAGNOSIS — Z09 Encounter for follow-up examination after completed treatment for conditions other than malignant neoplasm: Secondary | ICD-10-CM

## 2021-05-08 DIAGNOSIS — U071 COVID-19: Secondary | ICD-10-CM | POA: Diagnosis not present

## 2021-05-08 DIAGNOSIS — I4819 Other persistent atrial fibrillation: Secondary | ICD-10-CM | POA: Diagnosis not present

## 2021-05-08 DIAGNOSIS — Z08 Encounter for follow-up examination after completed treatment for malignant neoplasm: Secondary | ICD-10-CM | POA: Insufficient documentation

## 2021-05-08 DIAGNOSIS — F1410 Cocaine abuse, uncomplicated: Secondary | ICD-10-CM | POA: Diagnosis not present

## 2021-05-08 DIAGNOSIS — I5042 Chronic combined systolic (congestive) and diastolic (congestive) heart failure: Secondary | ICD-10-CM | POA: Diagnosis not present

## 2021-05-08 DIAGNOSIS — Z79899 Other long term (current) drug therapy: Secondary | ICD-10-CM | POA: Insufficient documentation

## 2021-05-08 DIAGNOSIS — Z8616 Personal history of COVID-19: Secondary | ICD-10-CM | POA: Insufficient documentation

## 2021-05-08 DIAGNOSIS — I130 Hypertensive heart and chronic kidney disease with heart failure and stage 1 through stage 4 chronic kidney disease, or unspecified chronic kidney disease: Secondary | ICD-10-CM | POA: Insufficient documentation

## 2021-05-08 DIAGNOSIS — Z7182 Exercise counseling: Secondary | ICD-10-CM | POA: Diagnosis not present

## 2021-05-08 MED ORDER — SPIRONOLACTONE 25 MG PO TABS
25.0000 mg | ORAL_TABLET | Freq: Every day | ORAL | 0 refills | Status: DC
  Filled 2021-05-08 – 2021-05-26 (×2): qty 90, 90d supply, fill #0

## 2021-05-08 MED ORDER — ALBUTEROL SULFATE HFA 108 (90 BASE) MCG/ACT IN AERS
2.0000 | INHALATION_SPRAY | Freq: Four times a day (QID) | RESPIRATORY_TRACT | 0 refills | Status: DC | PRN
  Filled 2021-05-08 – 2021-05-26 (×2): qty 18, 25d supply, fill #0

## 2021-05-08 MED ORDER — HYDROXYZINE HCL 50 MG PO TABS
50.0000 mg | ORAL_TABLET | Freq: Three times a day (TID) | ORAL | 1 refills | Status: DC
  Filled 2021-05-08 – 2021-05-26 (×2): qty 30, 10d supply, fill #0
  Filled 2021-06-23 – 2021-07-02 (×2): qty 30, 10d supply, fill #1

## 2021-05-08 NOTE — Patient Instructions (Signed)
[place].org is the website for narcotics anonymous [employer]org (website) or 336-854-4278 is the information for alcoholics anonymous Both are free and immediately available for help with alcohol and drug use  

## 2021-05-09 ENCOUNTER — Other Ambulatory Visit: Payer: Self-pay

## 2021-05-09 LAB — CBC WITH DIFFERENTIAL/PLATELET
Basophils Absolute: 0.1 10*3/uL (ref 0.0–0.2)
Basos: 1 %
EOS (ABSOLUTE): 0.3 10*3/uL (ref 0.0–0.4)
Eos: 6 %
Hematocrit: 39.4 % (ref 34.0–46.6)
Hemoglobin: 12.7 g/dL (ref 11.1–15.9)
Immature Grans (Abs): 0 10*3/uL (ref 0.0–0.1)
Immature Granulocytes: 0 %
Lymphocytes Absolute: 2.5 10*3/uL (ref 0.7–3.1)
Lymphs: 40 %
MCH: 26.7 pg (ref 26.6–33.0)
MCHC: 32.2 g/dL (ref 31.5–35.7)
MCV: 83 fL (ref 79–97)
Monocytes Absolute: 0.7 10*3/uL (ref 0.1–0.9)
Monocytes: 11 %
Neutrophils Absolute: 2.6 10*3/uL (ref 1.4–7.0)
Neutrophils: 42 %
Platelets: 217 10*3/uL (ref 150–450)
RBC: 4.75 x10E6/uL (ref 3.77–5.28)
RDW: 13.8 % (ref 11.7–15.4)
WBC: 6.2 10*3/uL (ref 3.4–10.8)

## 2021-05-09 LAB — COMPREHENSIVE METABOLIC PANEL
ALT: 12 IU/L (ref 0–32)
AST: 17 IU/L (ref 0–40)
Albumin/Globulin Ratio: 1.6 (ref 1.2–2.2)
Albumin: 5 g/dL — ABNORMAL HIGH (ref 3.8–4.9)
Alkaline Phosphatase: 108 IU/L (ref 44–121)
BUN/Creatinine Ratio: 20 (ref 9–23)
BUN: 29 mg/dL — ABNORMAL HIGH (ref 6–24)
Bilirubin Total: 0.2 mg/dL (ref 0.0–1.2)
CO2: 22 mmol/L (ref 20–29)
Calcium: 9.7 mg/dL (ref 8.7–10.2)
Chloride: 101 mmol/L (ref 96–106)
Creatinine, Ser: 1.43 mg/dL — ABNORMAL HIGH (ref 0.57–1.00)
Globulin, Total: 3.1 g/dL (ref 1.5–4.5)
Glucose: 105 mg/dL — ABNORMAL HIGH (ref 70–99)
Potassium: 4.3 mmol/L (ref 3.5–5.2)
Sodium: 136 mmol/L (ref 134–144)
Total Protein: 8.1 g/dL (ref 6.0–8.5)
eGFR: 43 mL/min/{1.73_m2} — ABNORMAL LOW (ref >59)

## 2021-05-11 ENCOUNTER — Other Ambulatory Visit: Payer: Self-pay

## 2021-05-12 ENCOUNTER — Other Ambulatory Visit: Payer: Self-pay

## 2021-05-12 MED ORDER — OMEPRAZOLE 40 MG PO CPDR
DELAYED_RELEASE_CAPSULE | ORAL | 2 refills | Status: DC
  Filled 2021-05-12 – 2021-05-26 (×2): qty 30, 30d supply, fill #0
  Filled 2021-06-23 – 2021-07-02 (×2): qty 30, 30d supply, fill #1
  Filled 2021-07-25 – 2021-08-06 (×2): qty 30, 30d supply, fill #2

## 2021-05-13 NOTE — Progress Notes (Unsigned)
Office Visit    Patient Name: Morgan Bean Date of Encounter: 05/13/2021  PCP:  [name], MD   [place]  Cardiologist:  [name], MD  Advanced Practice Provider:  No care team member to display Electrophysiologist:  None      Chief Complaint    Morgan Bean is a 56 y.o. female with a hx of left atrial appendage thrombus, atrial flutter post ablation with recurrent atrial flutter/fib, cocaine use, anxiety, hypertension CKDIIIa, chronic diastolic heart failure, prior CVA, mild pulmonary hypertension, COPD, OSA not on CPAP, depression presents today for hospital follow up.   Past Medical History    Past Medical History:  Diagnosis Date   Anemia of other chronic disease 11/13/2013   Anginal pain ([place])    none in past year   Anxiety    Asthma    patient denies   Candidiasis 06/23/2013   Chronic diastolic CHF (congestive heart failure) ([name])    Constipation    Coronary artery disease    Lexiscan myoview (10/12) with significant ST depression upon Lexiscan injection but no ischemia or infarction on perfusion images. Left heart cath (10/12): 30% mid LAD, 30% ostial D1, 50% ostial D2.     Cough 11/13/2013   Depression    Headache 02/22/2014   Hx of cardiovascular stress test    Lexiscan Myoview (2/16): Breast attenuation artifact, no ischemia, EF 64%; low risk   Hyperlipemia    Hypertension    Iron deficiency    hx of   Leukocytoclastic vasculitis ([name])    Rash across lower body, occurred in 9/11, diagnosed by skin biopsy. ANA positive. Thought to be secondary to cocaine use.   Leukopenia 03/21/2013   Lymphadenopathy 03/21/2013   Lymphadenopathy 01/30/2014   Mental disorder    Pancytopenia ([place])    Pancytopenia, acquired ([place]) 07/16/2015   Polysubstance abuse ([place])    Prior cocaine   Pulmonary HTN ([place])    Echo (9/11) with EF 65%, mild LVH, mild AI, mild MR, moderate to possibly severe TR with PA systolic pressure 52 mmHg. Echo  (10/12): severe LV hypertrophy, EF 60-65%, mild MR, mild AI, moderate to severe tricuspid regurgitation, PA systolic pressure 38 mmHg.  Echo 4/14: moderate LVH, EF 65%, normal wall motion, diastolic dysfunction, mild AI, mild MR   Shortness of breath    a. PFTs 5/14:  FEV1/FVC 99% predicted; FVC 60% predicted; DLCO mildly reduced, mild restriction, air trapping.;  b.  seen by pulmo (Dr. [name]) 5/14: mainly upper airway symptoms - ACE d/c'd (sx's better)   Stroke [facility]) 2010ish   Syphilis 04/25/2013   Tobacco abuse    Tricuspid regurgitation    Unspecified deficiency anemia 03/29/2013   Past Surgical History:  Procedure Laterality Date   ANKLE SURGERY     right   BONE MARROW ASPIRATION     CARDIAC CATHETERIZATION     CARDIAC CATHETERIZATION N/A 04/23/2015   Procedure: Right Heart Cath;  Surgeon: [name], MD;  Location: [facility];  Service: Cardiovascular;  Laterality: N/A;   CARDIOVERSION N/A 03/11/2018   Procedure: CARDIOVERSION;  Surgeon: [name], MD;  Location: [facility] ENDOSCOPY;  Service: Cardiovascular;  Laterality: N/A;   I & D EXTREMITY  08/09/2011   Procedure: IRRIGATION AND DEBRIDEMENT EXTREMITY;  Surgeon: [name], MD;  Location: [facility];  Service: General;  Laterality: Left;  i & D Left axilla abscess   LYMPH NODE BIOPSY N/A 04/05/2013   Procedure: EXCISIONAL  BIOPSY RIGHT SUBMANDIBULAR NODE, NASAL ENDOSCOPIC WITH BIOPSY NASAL PHARYNX;  Surgeon: [name], MD;  Location: [facility];  Service: ENT;  Laterality: N/A;   MULTIPLE EXTRACTIONS WITH ALVEOLOPLASTY N/A 03/03/2019   Procedure: MULTIPLE EXTRACTION WITH ALVEOLOPLASTY;  Surgeon: [name], DDS;  Location: [facility];  Service: Oral Surgery;  Laterality: N/A;   TEE WITHOUT CARDIOVERSION N/A 03/11/2018   Procedure: TRANSESOPHAGEAL ECHOCARDIOGRAM (TEE);  Surgeon: [name], MD;  Location: [facility];  Service: Cardiovascular;  Laterality: N/A;   TEE WITHOUT CARDIOVERSION N/A 12/27/2020   Procedure:  TRANSESOPHAGEAL ECHOCARDIOGRAM (TEE);  Surgeon: [name], DO;  Location: [facility] ENDOSCOPY;  Service: Cardiovascular;  Laterality: N/A;    Allergies  Allergies  Allergen Reactions   Ciprofloxacin Itching    History of Present Illness    Morgan Bean is a 56 y.o. female with a hx of left atrial appendage thrombus, atrial flutter post ablation with recurrent atrial flutter/fib, cocaine use, anxiety, hypertension CKDIIIa, chronic diastolic heart failure, prior CVA, mild pulmonary hypertension, COPD, OSA not on CPAP, depression last seen ***  Admitted 02/2018 with acute on chronic diastolic heart failure and atrial fib/flutter with RVR. Echo with LVEF 50-55%, mild AI, moderate to severe MR. Diltiazem and Xarelto started.   ED visit 04/2018 due to atrial flutter. Cardiac enzymes unremarkable, CXR normal, urine tox positive for amphetamines. She had cardioversion but did not maintain NSR. She was subsequently started on Amiodarone and maintained NSR. Lost to follow up until admission 12/2020 with community acquired pneumonia and atrial flutter. Positive for cocaine on admission. TEE showed left atrial appendage thrombus so cardioversion could not be pursued. LVEF 55-60%, RV normal size/function, mild to moderate MR, moderate AI. She was discharged on Xarelto, Amiodarone, Diltiazem with plans to schedule outpatient cardioversion.   Seen in delayed follow up 04/10/21  Readmitted 04/13/21 with atrial fib RVR and HFpEF in the setting of COVID19 and cocaine use. Echo showed preserved LVEF 60-65%.   Tells me since her admission she has moved in with friends which is a safer environment. Her mother is assisting her by driving her to appointments. When she was on the medication she tells me she felt fine. She has been out of multiple medications for >1 month. Unclear which prescriptions she is taking at home. Per my discussion with her pharmacy she last picked up Xarelto on 02/24/21 for a 30 day supply. Her  weight today is up about 16 pounds since discharge. Notes abdominal bloating and pedal edema. She notes shortness of breath when she is active. No orthopnea nor PND.   ***  EKGs/Labs/Other Studies Reviewed:   The following studies were reviewed today:  TEE 12/27/20  1. Left ventricular ejection fraction, by estimation, is 55 to 60%. The  left ventricle has normal function.   2. Right ventricular systolic function is normal. The right ventricular  size is normal.   3. A left atrial/left atrial appendage thrombus was detected. The LAA  emptying velocity was 22 cm/s.   4. The mitral valve is normal in structure. Mild to moderate mitral valve  regurgitation.   5. The aortic valve is tricuspid. Aortic valve regurgitation is moderate.   Echo 12/25/20  1. Left ventricular ejection fraction, by estimation, is 60 to 65%. The  left ventricle has normal function. The left ventricle has no regional  wall motion abnormalities. There is mild left ventricular hypertrophy.  Left ventricular diastolic function  could not be evaluated.   2. Right ventricular systolic function is normal. The  right ventricular  size is normal. There is normal pulmonary artery systolic pressure. The  estimated right ventricular systolic pressure is 09.7 mmHg.   3. Left atrial size was severely dilated.   4. Right atrial size was severely dilated.   5. The mitral valve is abnormal. Moderate mitral valve regurgitation.  Moderate mitral annular calcification.   6. The tricuspid valve is abnormal. Tricuspid valve regurgitation is  moderate.   7. The aortic valve is tricuspid. Aortic valve regurgitation is moderate.  Mild aortic valve sclerosis is present, with no evidence of aortic valve  stenosis. Aortic regurgitation PHT measures 498 msec.   8. The inferior vena cava is normal in size with <50% respiratory  variability, suggesting right atrial pressure of 8 mmHg.   Comparison(s): Changes from prior study are noted.  01/25/2019: LVEF 60-65%,  mild MR, mild to moderate AI, mild TR.   EKG:  EKG is ordered today.  The ekg ordered today demonstrates atrial fibrillation 140 bpm with RVR and PVC's. ***  Recent Labs: 12/17/2020: NT-Pro BNP 2,714 12/25/2020: TSH 2.038 12/27/2020: Magnesium 2.2 04/13/2021: B Natriuretic Peptide 1,212.6 05/08/2021: ALT 12; BUN 29; Creatinine, Ser 1.43; Hemoglobin 12.7; Platelets 217; Potassium 4.3; Sodium 136  Recent Lipid Panel    Component Value Date/Time   CHOL 152 12/26/2020 0232   TRIG 51 12/26/2020 0232   HDL 48 12/26/2020 0232   CHOLHDL 3.2 12/26/2020 0232   VLDL 10 12/26/2020 0232   LDLCALC 94 12/26/2020 0232   LDLDIRECT 142.1 12/25/2010 0846    Risk Assessment/Calculations:  *** CHA2DS2-VASc Score = 4   This indicates a 4.8% annual risk of stroke. The patient's score is based upon: CHF History: 1 HTN History: 1 Diabetes History: 0 Stroke History: 0 Vascular Disease History: 1 Age Score: 0 Gender Score: 1    Home Medications   No outpatient medications have been marked as taking for the 05/14/21 encounter (Appointment) with Loel Dubonnet, NP.     Review of Systems     All other systems reviewed and are otherwise negative except as noted above.  Physical Exam    VS:  LMP 05/10/2013  , BMI There is no height or weight on file to calculate BMI.  Wt Readings from Last 3 Encounters:  05/08/21 175 lb 4 oz (79.5 kg)  04/17/21 167 lb 1.7 oz (75.8 kg)  04/10/21 179 lb (81.2 kg)    *** GEN: Well nourished, well developed, in no acute distress. HEENT: normal. Neck: Supple, no JVD, carotid bruits, or masses. Cardiac: tachycardic, irregularly irregular***, no  rubs, or gallops. No clubbing, cyanosis, edema. Gr2/6 murmur.  Radials/PT 2+ and equal bilaterally.  Respiratory:  Respirations regular and unlabored, clear to auscultation bilaterally. GI: Soft, nontender, nondistended. MS: No deformity or atrophy. Skin: Warm and dry, no rash. Neuro:  Strength  and sensation are intact. Psych: Normal affect.  Assessment & Plan    Atrial flutter / LAA thrombus - Ablative therapy in 2018. Admission 12/2020 with recurrent atrial flutter. Diltiazem dose adjusted and Amiodarone added. Has not taken Diltiazem in at least 1 month. She endorses compliance with Xarelto however pharmacy last filled 30 day supply 02/24/21 which is >1 month ago. Low suspicion she has completed 3 weeks of uninterrupted therapy and given known LAA thrombus will not schedule cardioversion today. As she has not been taking Amiodarone, is in atrial fibrillation, not completed 3 weeks of [place], will not resume at this time. We have refilled Diltiazem 300mg  QD and Xarelto 20mg  QD.  Long discussion regarding importance of regular medications. Initial HR 140 bpm today in clinic and she was provided Metoprolol tartrate 50mg  which improved her HR to 92 bpm. She was instructed to pick her medications up and start today. Prompt follow up in 2 weeks. If she remains in atrial fibrillation an outpatient cardioversion could be scheduled at that time so long as she completes 3 weeks of anticoagulation. Likely will plan for TEE with DCCV to reassess LAA thrombus. CMP, TSH, CBC ordered today. ***  HTN - BP goal <130/80. ***  Cocaine use - Complete cessation encouraged. ***  CKDIIIa - Careful titration of diuretic and antihypertensive.   ***  Diastolic heart failure - Exacerbated by ***  Disposition: Follow up in 2 week(s) ***with [name], MD or APP.  Signed, Loel Dubonnet, NP 05/13/2021, 2:02 PM [place]

## 2021-05-14 ENCOUNTER — Encounter (HOSPITAL_BASED_OUTPATIENT_CLINIC_OR_DEPARTMENT_OTHER): Payer: Self-pay

## 2021-05-14 ENCOUNTER — Ambulatory Visit (HOSPITAL_BASED_OUTPATIENT_CLINIC_OR_DEPARTMENT_OTHER): Payer: Medicaid Other | Admitting: Family

## 2021-05-19 ENCOUNTER — Other Ambulatory Visit: Payer: Self-pay

## 2021-05-21 ENCOUNTER — Ambulatory Visit: Payer: Medicaid Other | Admitting: Internal Medicine

## 2021-05-26 ENCOUNTER — Other Ambulatory Visit: Payer: Self-pay

## 2021-06-23 ENCOUNTER — Other Ambulatory Visit: Payer: Self-pay | Admitting: Physician Assistant

## 2021-06-23 ENCOUNTER — Other Ambulatory Visit: Payer: Self-pay

## 2021-06-23 DIAGNOSIS — J449 Chronic obstructive pulmonary disease, unspecified: Secondary | ICD-10-CM

## 2021-06-24 ENCOUNTER — Other Ambulatory Visit: Payer: Self-pay

## 2021-06-24 MED ORDER — ALBUTEROL SULFATE HFA 108 (90 BASE) MCG/ACT IN AERS
2.0000 | INHALATION_SPRAY | Freq: Four times a day (QID) | RESPIRATORY_TRACT | 2 refills | Status: DC | PRN
  Filled 2021-06-24 – 2021-07-02 (×2): qty 18, 25d supply, fill #0
  Filled 2021-11-10: qty 18, 25d supply, fill #1

## 2021-06-24 NOTE — Telephone Encounter (Signed)
Requested Prescriptions  ?Pending Prescriptions Disp Refills  ?? albuterol (VENTOLIN HFA) 108 (90 Base) MCG/ACT inhaler 18 g 0  ?  Sig: Inhale 2 puffs into the lungs every 6 (six) hours as needed for wheezing or shortness of breath.  ?  ? Pulmonology:  Beta Agonists 2 Failed - 06/23/2021  4:15 AM  ?  ?  Failed - Last BP in normal range  ?  BP Readings from Last 1 Encounters:  ?05/08/21 (!) 141/93  ?   ?  ?  Passed - Last Heart Rate in normal range  ?  Pulse Readings from Last 1 Encounters:  ?05/08/21 84  ?   ?  ?  Passed - Valid encounter within last 12 months  ?  Recent Outpatient Visits   ?      ? 1 month ago COVID-19 virus infection  ? [place], [place], [place]  ? 6 months ago Hypertensive heart disease with chronic combined systolic and diastolic congestive heart failure ([place])  ? [place], Charlane Ferretti, MD  ? 6 months ago Atrial flutter with rapid ventricular response ([place])  ? [place], Charlane Ferretti, MD  ? 9 months ago Hoarseness of voice  ? [place], Charlane Ferretti, MD  ? 11 months ago Hypokalemia  ? [place], [place], [place]  ?  ?  ?Future Appointments   ?        ? In 2 days Loel Dubonnet, NP [facility]  ?  ? ?  ?  ?  ? ? ?

## 2021-06-26 ENCOUNTER — Ambulatory Visit (INDEPENDENT_AMBULATORY_CARE_PROVIDER_SITE_OTHER): Payer: Medicaid Other | Admitting: Family

## 2021-06-26 ENCOUNTER — Encounter (HOSPITAL_BASED_OUTPATIENT_CLINIC_OR_DEPARTMENT_OTHER): Payer: Self-pay | Admitting: Family

## 2021-06-26 VITALS — BP 130/88 | HR 92 | Ht 66.0 in | Wt 182.7 lb

## 2021-06-26 DIAGNOSIS — D6859 Other primary thrombophilia: Secondary | ICD-10-CM

## 2021-06-26 DIAGNOSIS — I110 Hypertensive heart disease with heart failure: Secondary | ICD-10-CM | POA: Diagnosis not present

## 2021-06-26 DIAGNOSIS — I4819 Other persistent atrial fibrillation: Secondary | ICD-10-CM

## 2021-06-26 DIAGNOSIS — I5042 Chronic combined systolic (congestive) and diastolic (congestive) heart failure: Secondary | ICD-10-CM

## 2021-06-26 DIAGNOSIS — I483 Typical atrial flutter: Secondary | ICD-10-CM

## 2021-06-26 MED ORDER — SPIRONOLACTONE 25 MG PO TABS
25.0000 mg | ORAL_TABLET | Freq: Every day | ORAL | 1 refills | Status: DC
  Filled 2021-06-26 – 2021-08-25 (×3): qty 90, 90d supply, fill #0
  Filled 2021-12-12: qty 90, 90d supply, fill #1

## 2021-06-26 MED ORDER — FUROSEMIDE 40 MG PO TABS
40.0000 mg | ORAL_TABLET | Freq: Every day | ORAL | 1 refills | Status: DC
  Filled 2021-07-02: qty 90, 90d supply, fill #0
  Filled 2021-08-06 – 2021-10-08 (×2): qty 90, 90d supply, fill #1

## 2021-06-26 NOTE — Patient Instructions (Addendum)
Medication Instructions:  ?Continue your current medications.  ? ?It is very important to take your Xarelto every day.  ? ?*If you need a refill on your cardiac medications before your next appointment, please call your pharmacy* ? ? ?Lab Work: ?Your physician recommends that you return for lab work on 07/03/21 for CBC, BMP, TSH  ? ?If you have labs (blood work) drawn today and your tests are completely normal, you will receive your results only by: ?MyChart Message (if you have MyChart) OR ?A paper copy in the mail ?If you have any lab test that is abnormal or we need to change your treatment, we will call you to review the results. ? ?Testing/Procedures: ?Your physician has requested that you have a TEE/Cardioversion. During a TEE, sound waves are used to create images of your heart. It provides your doctor with information about the size and shape of your heart and how well your heart?s chambers and valves are working. In this test, a transducer is attached to the end of a flexible tube that is guided down you throat and into your esophagus (the tube leading from your mouth to your stomach) to get a more detailed image of your heart. Once the TEE has determined that a blood clot is not present, the cardioversion begins. Electrical Cardioversion uses a jolt of electricity to your heart either through paddles or wired patches attached to your chest. This is a controlled, usually prescheduled, procedure. This procedure is done at the hospital and you are not awake during the procedure. You usually go home the day of the procedure. Please see the instruction sheet given to you today for more information. ? ? ?Follow-Up: ?At [facility], you and your health needs are our priority.  As part of our continuing mission to provide you with exceptional heart care, we have created designated Provider Care Teams.  These Care Teams include your primary Cardiologist (physician) and Advanced Practice Providers (APPs -   Physician Assistants and Nurse Practitioners) who all work together to provide you with the care you need, when you need it. ? ?We recommend signing up for the patient portal called "MyChart".  Sign up information is provided on this After Visit Summary.  MyChart is used to connect with patients for Virtual Visits (Telemedicine).  Patients are able to view lab/test results, encounter notes, upcoming appointments, etc.  Non-urgent messages can be sent to your provider as well.   ?To learn more about what you can do with MyChart, go to [email or web address].   ? ?Your next appointment:   ?2 month(s) ? ?The format for your next appointment:   ?In Person ? ?Provider:   ?[name], MD or Loel Dubonnet, NP   ? ? ?Other Instructions ? ?Recommend wearing a right wrist brace while sleeping to see if it helps with wrist pain.  ? ?To prevent or reduce lower extremity swelling: ?Eat a low salt diet. Salt makes the body hold onto extra fluid which causes swelling. ?Sit with legs elevated. For example, in the recliner or on an [place].  ?Wear knee-high compression stockings during the daytime. Ones labeled 15-20 mmHg provide good compression.  ? ?You are scheduled for a TEE Cardioversion on April 20th with Dr. Margaretann Loveless.  Please arrive at the [facility] (Main Entrance A) at [facility]: [address] [place], [place] [address] at 11:30 am. (1 hour prior to procedure unless lab work is needed; if lab work is needed arrive 1.5 hours ahead) ? ?DIET:  Nothing to eat or drink after midnight except a sip of water with medications (see medication instructions below) ? ?FYI: For your safety, and to allow [place] to monitor your vital signs accurately during the surgery/procedure we request that   ?if you have artificial nails, gel coating, SNS etc. Please have those removed prior to your surgery/procedure. Not having the nail coverings /polish removed may result in cancellation or delay of your  surgery/procedure. ? ?Medication Instructions: ?Hold Furosemide (Lasix) the morning of procedure ? ?Continue your anticoagulant: Xarelto  ? ?Labs: If patient is on Coumadin, patient needs pt/INR, CBC, BMET within 3 days (No pt/INR needed for patients taking Xarelto, Eliquis, Pradaxa) ?For patients receiving anesthesia for TEE and all Cardioversion patients: BMET, CBC within 1 week ? ?Come for labs 07/03/21 for BMP, CBC, TSH ? ?You may come to the...  ? ?[place] (3rd floor) ?[address], [place], [place]  ?Open: 8am-Noon and 1pm-4:30pm  ? ?[place] at [facility] ?[place]  ? ?[employer]- Any location ? ?**no appointments needed**  ? ?You must have a responsible person to drive you home and stay in the waiting area during your procedure. Failure to do so could result in cancellation. ? ?Interior and spatial designer cards. ? ?*Special Note: Every effort is made to have your procedure done on time. Occasionally there are emergencies that occur at the hospital that may cause delays. Please be patient if a delay does occur.   ? ?  ?

## 2021-06-26 NOTE — Progress Notes (Signed)
? ?Office Visit  ?  ?Patient Name: Morgan Bean ?Date of Encounter: 06/26/2021 ? ?PCP:  [name], MD ?  ?[place]  ?Cardiologist:  [name], MD  ?Advanced Practice Provider:  No care team member to display ?Electrophysiologist:  None  ?   ? ?Chief Complaint  ?  ?Morgan Bean is a 56 y.o. female with a hx of left atrial appendage thrombus, atrial flutter post ablation with recurrent atrial flutter/fib, cocaine use, anxiety, hypertension CKDIIIa, chronic diastolic heart failure, prior CVA, mild pulmonary hypertension, COPD, OSA not on CPAP, depression presents today for hospital follow up.  ? ?Past Medical History  ?  ?Past Medical History:  ?Diagnosis Date  ? Anemia of other chronic disease 11/13/2013  ? Anginal pain ([place])   ? none in past year  ? Anxiety   ? Asthma   ? patient denies  ? Candidiasis 06/23/2013  ? Chronic diastolic CHF (congestive heart failure) ([place])   ? Constipation   ? Coronary artery disease   ? Lexiscan myoview (10/12) with significant ST depression upon Lexiscan injection but no ischemia or infarction on perfusion images. Left heart cath (10/12): 30% mid LAD, 30% ostial D1, 50% ostial D2.    ? Cough 11/13/2013  ? Depression   ? Headache 02/22/2014  ? Hx of cardiovascular stress test   ? Lexiscan Myoview (2/16): Breast attenuation artifact, no ischemia, EF 64%; low risk  ? Hyperlipemia   ? Hypertension   ? Iron deficiency   ? hx of  ? Leukocytoclastic vasculitis ([place])   ? Rash across lower body, occurred in 9/11, diagnosed by skin biopsy. ANA positive. Thought to be secondary to cocaine use.  ? Leukopenia 03/21/2013  ? Lymphadenopathy 03/21/2013  ? Lymphadenopathy 01/30/2014  ? Mental disorder   ? Pancytopenia ([name])   ? Pancytopenia, acquired ([name]) 07/16/2015  ? Polysubstance abuse ([place])   ? Prior cocaine  ? Pulmonary HTN ([name])   ? Echo (9/11) with EF 65%, mild LVH, mild AI, mild MR, moderate to possibly severe TR with PA systolic pressure 52 mmHg. Echo (10/12):  severe LV hypertrophy, EF 60-65%, mild MR, mild AI, moderate to severe tricuspid regurgitation, PA systolic pressure 38 mmHg.  Echo 4/14: moderate LVH, EF 65%, normal wall motion, diastolic dysfunction, mild AI, mild MR  ? Shortness of breath   ? a. PFTs 5/14:  FEV1/FVC 99% predicted; FVC 60% predicted; DLCO mildly reduced, mild restriction, air trapping.;  b.  seen by pulmo (Dr. [name]) 5/14: mainly upper airway symptoms - ACE d/c'd (sx's better)  ? Stroke [facility]) 2010ish  ? Syphilis 04/25/2013  ? Tobacco abuse   ? Tricuspid regurgitation   ? Unspecified deficiency anemia 03/29/2013  ? ?Past Surgical History:  ?Procedure Laterality Date  ? ANKLE SURGERY    ? right  ? BONE MARROW ASPIRATION    ? CARDIAC CATHETERIZATION    ? CARDIAC CATHETERIZATION N/A 04/23/2015  ? Procedure: Right Heart Cath;  Surgeon: [name], MD;  Location: [facility];  Service: Cardiovascular;  Laterality: N/A;  ? CARDIOVERSION N/A 03/11/2018  ? Procedure: CARDIOVERSION;  Surgeon: [name], MD;  Location: [facility];  Service: Cardiovascular;  Laterality: N/A;  ? I & D EXTREMITY  08/09/2011  ? Procedure: IRRIGATION AND DEBRIDEMENT EXTREMITY;  Surgeon: [name], MD;  Location: [facility];  Service: General;  Laterality: Left;  i & D Left axilla abscess  ? LYMPH NODE BIOPSY N/A 04/05/2013  ? Procedure: EXCISIONAL  BIOPSY RIGHT SUBMANDIBULAR NODE, NASAL ENDOSCOPIC WITH BIOPSY NASAL PHARYNX;  Surgeon: [name], MD;  Location: [facility];  Service: ENT;  Laterality: N/A;  ? MULTIPLE EXTRACTIONS WITH ALVEOLOPLASTY N/A 03/03/2019  ? Procedure: MULTIPLE EXTRACTION WITH ALVEOLOPLASTY;  Surgeon: [name], DDS;  Location: [facility];  Service: Oral Surgery;  Laterality: N/A;  ? TEE WITHOUT CARDIOVERSION N/A 03/11/2018  ? Procedure: TRANSESOPHAGEAL ECHOCARDIOGRAM (TEE);  Surgeon: [name], MD;  Location: [facility];  Service: Cardiovascular;  Laterality: N/A;  ? TEE WITHOUT CARDIOVERSION N/A 12/27/2020  ? Procedure: TRANSESOPHAGEAL  ECHOCARDIOGRAM (TEE);  Surgeon: [name], DO;  Location: [place] [facility];  Service: Cardiovascular;  Laterality: N/A;  ? ? ?Allergies ? ?Allergies  ?Allergen Reactions  ? Ciprofloxacin Itching  ? ? ?History of Present Illness  ?  ?Morgan Bean is a 56 y.o. female with a hx of left atrial appendage thrombus, atrial flutter post ablation with recurrent atrial flutter/fib, cocaine use, anxiety, hypertension CKDIIIa, chronic diastolic heart failure, prior CVA, mild pulmonary hypertension, COPD, OSA not on CPAP, depression last seen while hospitalized ? ?Admitted 02/2018 with acute on chronic diastolic heart failure and atrial fib/flutter with RVR. Echo with LVEF 50-55%, mild AI, moderate to severe MR. Diltiazem and Xarelto started.  ? ?ED visit 04/2018 due to atrial flutter. Cardiac enzymes unremarkable, CXR normal, urine tox positive for amphetamines. She had cardioversion but did not maintain NSR. She was subsequently started on Amiodarone and maintained NSR. Last seen in clinic 12/2018. ? ?She was readmitted 12/2020 with community acquired pneumonia and atrial flutter. Positive for cocaine on admission. TEE showed left atrial appendage thrombus so cardioversion could not be pursued. LVEF 55-60%, RV normal size/function, mild to moderate MR, moderate AI. She was discharged on Xarelto, Amiodarone, Diltiazem with plans to schedule outpatient cardioversion.  ? ?She was lost to follow-up until 04/10/2021. Found to be in atrial fibrillation 140 bpm.  Diltiazem 300 mg daily, Xarelto 20 mg daily were resumed. ? ?Subsequently admitted 1/22 - 04/17/2021 with A-fib RVR, acute on chronic diastolic heart failure, [id number].  She had used cocaine 1 day prior to hospitalization.  Echo 04/14/2021 LVEF 60 to 65%, no RWMA, bilateral atria moderately dilated, mild MR. ? ?Presents today for follow up with her mother. She is living with friends which she says is a safe situation. Has been compliant with meds and feeling better. Thinks she  might have taken a dose of Xarelto late but does not recall missing dose. Only notes dyspnea or sensation of heart racing if she does more than usual activity. Notes some fatigue which has been ongoing and overall unchanged. Tells me she is using explicit substances "less" but does not provide further details likely as her mother is present for exam. Denies edema, orthopnea, PND, chest pain.  ? ?EKGs/Labs/Other Studies Reviewed:  ? ?The following studies were reviewed today: ? ?TEE 12/27/20 ? 1. Left ventricular ejection fraction, by estimation, is 55 to 60%. The  ?left ventricle has normal function.  ? 2. Right ventricular systolic function is normal. The right ventricular  ?size is normal.  ? 3. A left atrial/left atrial appendage thrombus was detected. The LAA  ?emptying velocity was 22 cm/s.  ? 4. The mitral valve is normal in structure. Mild to moderate mitral valve  ?regurgitation.  ? 5. The aortic valve is tricuspid. Aortic valve regurgitation is moderate.  ? ?Echo 12/25/20 ? 1. Left ventricular ejection fraction, by estimation, is 60 to 65%. The  ?left ventricle has normal function. The left  ventricle has no regional  ?wall motion abnormalities. There is mild left ventricular hypertrophy.  ?Left ventricular diastolic function  ?could not be evaluated.  ? 2. Right ventricular systolic function is normal. The right ventricular  ?size is normal. There is normal pulmonary artery systolic pressure. The  ?estimated right ventricular systolic pressure is 41.5 mmHg.  ? 3. Left atrial size was severely dilated.  ? 4. Right atrial size was severely dilated.  ? 5. The mitral valve is abnormal. Moderate mitral valve regurgitation.  ?Moderate mitral annular calcification.  ? 6. The tricuspid valve is abnormal. Tricuspid valve regurgitation is  ?moderate.  ? 7. The aortic valve is tricuspid. Aortic valve regurgitation is moderate.  ?Mild aortic valve sclerosis is present, with no evidence of aortic valve  ?stenosis. Aortic  regurgitation PHT measures 498 msec.  ? 8. The inferior vena cava is normal in size with <50% respiratory  ?variability, suggesting right atrial pressure of 8 mmHg.  ? ?Comparison(s): Changes from prior stu

## 2021-06-27 ENCOUNTER — Other Ambulatory Visit: Payer: Self-pay

## 2021-06-30 ENCOUNTER — Other Ambulatory Visit: Payer: Self-pay

## 2021-07-01 ENCOUNTER — Other Ambulatory Visit: Payer: Self-pay

## 2021-07-02 ENCOUNTER — Other Ambulatory Visit: Payer: Self-pay

## 2021-07-04 ENCOUNTER — Encounter (HOSPITAL_COMMUNITY): Payer: Self-pay | Admitting: Internal Medicine

## 2021-07-04 ENCOUNTER — Other Ambulatory Visit: Payer: Self-pay

## 2021-07-04 NOTE — Progress Notes (Signed)
Attempted to obtain medical history via telephone, unable to reach at this time. I left a voicemail to return pre surgical testing department's phone call.  

## 2021-07-08 ENCOUNTER — Encounter (HOSPITAL_BASED_OUTPATIENT_CLINIC_OR_DEPARTMENT_OTHER): Payer: Self-pay

## 2021-07-08 ENCOUNTER — Telehealth: Payer: Self-pay | Admitting: Family

## 2021-07-08 ENCOUNTER — Encounter: Payer: Self-pay | Admitting: Family Medicine

## 2021-07-08 NOTE — Telephone Encounter (Signed)
Left message for patient to call back ? ?Calling to find out when patient would like [place] to try and reschedule her TEE.  ?

## 2021-07-08 NOTE — Telephone Encounter (Signed)
Patient is calling to reschedule her TEE scheduled on 04/20 ?

## 2021-07-09 ENCOUNTER — Telehealth (HOSPITAL_BASED_OUTPATIENT_CLINIC_OR_DEPARTMENT_OTHER): Payer: Self-pay

## 2021-07-09 NOTE — Telephone Encounter (Signed)
Rn called to reschedule TEE Cardioversion at patients request.  ? ? ? ? ? ? ? ?You are scheduled for a TEE Cardioversion on May 1st with Dr. Radford Pax  Please arrive at the [facility] (Main Entrance A) at [facility]: [place], [place] [address] at 10 am. (1 hour prior to procedure unless lab work is needed; if lab work is needed arrive 1.5 hours ahead) ?  ?DIET: Nothing to eat or drink after midnight except a sip of water with medications (see medication instructions below) ?  ?FYI: For your safety, and to allow [place] to monitor your vital signs accurately during the surgery/procedure we request that   ?if you have artificial nails, gel coating, SNS etc. Please have those removed prior to your surgery/procedure. Not having the nail coverings /polish removed may result in cancellation or delay of your surgery/procedure. ?  ?Medication Instructions: ?Hold Furosemide (Lasix) the morning of procedure ?  ?Continue your anticoagulant: Xarelto  ? ?  ?Come for labs 07/14/21 for BMP, CBC, TSH ? ?  ?You must have a responsible person to drive you home and stay in the waiting area during your procedure. Failure to do so could result in cancellation. ?  ?Interior and spatial designer cards. ?  ?*Special Note: Every effort is made to have your procedure done on time. Occasionally there are emergencies that occur at the hospital that may cause delays. Please be patient if a delay does occur. ?

## 2021-07-09 NOTE — Telephone Encounter (Signed)
Patient also sent in [place] message, responded via message and got patient rescheduled.  ?

## 2021-07-14 ENCOUNTER — Encounter (HOSPITAL_COMMUNITY): Payer: Self-pay | Admitting: Cardiology

## 2021-07-14 NOTE — Progress Notes (Signed)
Attempted to obtain medical history via telephone, unable to reach at this time. I left a voicemail to return pre surgical testing department's phone call.  

## 2021-07-18 ENCOUNTER — Encounter (HOSPITAL_COMMUNITY): Payer: Self-pay | Admitting: Certified Registered"

## 2021-07-21 ENCOUNTER — Encounter (HOSPITAL_COMMUNITY): Payer: Self-pay | Admitting: Cardiology

## 2021-07-21 ENCOUNTER — Ambulatory Visit (HOSPITAL_COMMUNITY): Admission: RE | Admit: 2021-07-21 | Payer: Medicaid Other | Source: Home / Self Care | Admitting: Cardiology

## 2021-07-21 ENCOUNTER — Ambulatory Visit (HOSPITAL_COMMUNITY): Payer: Medicaid Other

## 2021-07-21 ENCOUNTER — Other Ambulatory Visit (HOSPITAL_COMMUNITY): Payer: Self-pay | Admitting: Cardiology

## 2021-07-21 DIAGNOSIS — I4891 Unspecified atrial fibrillation: Secondary | ICD-10-CM

## 2021-07-21 SURGERY — ECHOCARDIOGRAM, TRANSESOPHAGEAL
Anesthesia: Monitor Anesthesia Care

## 2021-07-21 NOTE — Anesthesia Preprocedure Evaluation (Deleted)
Anesthesia Evaluation  ?Patient identified by MRN, date of birth, ID band ?Patient awake ? ? ? ?Reviewed: ?Allergy & Precautions, NPO status , Patient's Chart, lab work & pertinent test results ? ?Airway ?Mallampati: II ? ?TM Distance: >3 FB ?Neck ROM: Full ? ? ? Dental ?no notable dental hx. ? ?  ?Pulmonary ?neg pulmonary ROS, Current Smoker and Patient abstained from smoking.,  ?  ?Pulmonary exam normal ?breath sounds clear to auscultation ? ? ? ? ? ? Cardiovascular ?hypertension, Pt. on medications ?+ CAD  ?+ dysrhythmias Atrial Fibrillation + Valvular Problems/Murmurs MR and AI  ?Rhythm:Irregular Rate:Normal ? ?Echo (9/11) with EF 65%, mild LVH, mild AI, mild MR, moderate to possibly severe TR with PA systolic pressure 52 mmHg. Echo (10/12): severe LV hypertrophy, EF 60-65%, mild MR, mild AI, moderate to severe tricuspid regurgitation, PA systolic pressure 38 mmHg.  Echo 4/14: moderate LVH, EF 65%, normal wall motion, diastolic dysfunction, mild AI, mild MR ?  ?Neuro/Psych ?negative neurological ROS ? negative psych ROS  ? GI/Hepatic ?negative GI ROS, (+)  ?  ? substance abuse ? cocaine use,   ?Endo/Other  ?negative endocrine ROS ? Renal/GU ?negative Renal ROS  ?negative genitourinary ?  ?Musculoskeletal ?negative musculoskeletal ROS ?(+)  ? Abdominal ?  ?Peds ?negative pediatric ROS ?(+)  Hematology ?negative hematology ROS ?(+)   ?Anesthesia Other Findings ? ? Reproductive/Obstetrics ?negative OB ROS ? ?  ? ? ? ? ? ? ? ? ? ? ? ? ? ?  ?  ? ? ? ? ? ? ? ? ?Anesthesia Physical ?Anesthesia Plan ? ?ASA: 3 ? ?Anesthesia Plan: MAC  ? ?Post-op Pain Management: Minimal or no pain anticipated  ? ?Induction: Intravenous ? ?PONV Risk Score and Plan: 2 and Propofol infusion and Treatment may vary due to age or medical condition ? ?Airway Management Planned: Simple Face Mask ? ?Additional Equipment:  ? ?Intra-op Plan:  ? ?Post-operative Plan:  ? ?Informed Consent: I have reviewed the  patients History and Physical, chart, labs and discussed the procedure including the risks, benefits and alternatives for the proposed anesthesia with the patient or authorized representative who has indicated his/her understanding and acceptance.  ? ? ? ?Dental advisory given ? ?Plan Discussed with: CRNA and Surgeon ? ?Anesthesia Plan Comments:   ? ? ? ? ? ? ?Anesthesia Quick Evaluation ? ?

## 2021-07-22 ENCOUNTER — Other Ambulatory Visit (HOSPITAL_BASED_OUTPATIENT_CLINIC_OR_DEPARTMENT_OTHER): Payer: Self-pay | Admitting: Family

## 2021-07-22 ENCOUNTER — Telehealth (HOSPITAL_BASED_OUTPATIENT_CLINIC_OR_DEPARTMENT_OTHER): Payer: Self-pay

## 2021-07-22 ENCOUNTER — Encounter (INDEPENDENT_AMBULATORY_CARE_PROVIDER_SITE_OTHER): Payer: Self-pay

## 2021-07-22 ENCOUNTER — Other Ambulatory Visit: Payer: Self-pay

## 2021-07-22 ENCOUNTER — Ambulatory Visit: Payer: Medicaid Other | Attending: Family Medicine | Admitting: Family Medicine

## 2021-07-22 ENCOUNTER — Encounter: Payer: Self-pay | Admitting: Family Medicine

## 2021-07-22 VITALS — BP 131/90 | HR 106 | Ht 66.0 in | Wt 183.2 lb

## 2021-07-22 DIAGNOSIS — E785 Hyperlipidemia, unspecified: Secondary | ICD-10-CM | POA: Diagnosis not present

## 2021-07-22 DIAGNOSIS — Z79899 Other long term (current) drug therapy: Secondary | ICD-10-CM | POA: Insufficient documentation

## 2021-07-22 DIAGNOSIS — Z87891 Personal history of nicotine dependence: Secondary | ICD-10-CM | POA: Insufficient documentation

## 2021-07-22 DIAGNOSIS — M25531 Pain in right wrist: Secondary | ICD-10-CM | POA: Diagnosis present

## 2021-07-22 DIAGNOSIS — F1910 Other psychoactive substance abuse, uncomplicated: Secondary | ICD-10-CM | POA: Diagnosis not present

## 2021-07-22 DIAGNOSIS — I10 Essential (primary) hypertension: Secondary | ICD-10-CM | POA: Insufficient documentation

## 2021-07-22 DIAGNOSIS — I4819 Other persistent atrial fibrillation: Secondary | ICD-10-CM

## 2021-07-22 DIAGNOSIS — Z7901 Long term (current) use of anticoagulants: Secondary | ICD-10-CM | POA: Insufficient documentation

## 2021-07-22 DIAGNOSIS — F32A Depression, unspecified: Secondary | ICD-10-CM | POA: Insufficient documentation

## 2021-07-22 DIAGNOSIS — I513 Intracardiac thrombosis, not elsewhere classified: Secondary | ICD-10-CM | POA: Insufficient documentation

## 2021-07-22 DIAGNOSIS — J45909 Unspecified asthma, uncomplicated: Secondary | ICD-10-CM | POA: Insufficient documentation

## 2021-07-22 DIAGNOSIS — Z8673 Personal history of transient ischemic attack (TIA), and cerebral infarction without residual deficits: Secondary | ICD-10-CM | POA: Diagnosis not present

## 2021-07-22 DIAGNOSIS — I2510 Atherosclerotic heart disease of native coronary artery without angina pectoris: Secondary | ICD-10-CM | POA: Diagnosis not present

## 2021-07-22 DIAGNOSIS — G5603 Carpal tunnel syndrome, bilateral upper limbs: Secondary | ICD-10-CM | POA: Insufficient documentation

## 2021-07-22 MED ORDER — PREDNISONE 20 MG PO TABS
20.0000 mg | ORAL_TABLET | Freq: Every day | ORAL | 0 refills | Status: DC
  Filled 2021-07-22: qty 10, 10d supply, fill #0

## 2021-07-22 NOTE — Telephone Encounter (Signed)
Okay to reschedule for a [place] with whomever is Reader B. Will need CBC/BMP 1 week prior to DCCV. Please provide her phone number to call and reschedule if she is unable to make planned time rather than no-show.  ? ?Loel Dubonnet, NP  ?

## 2021-07-22 NOTE — Telephone Encounter (Signed)
Please advise 

## 2021-07-22 NOTE — Patient Instructions (Signed)

## 2021-07-22 NOTE — Progress Notes (Signed)
? ?Subjective:  ?Patient ID: Morgan Bean, female    DOB: [birth year]  Age: 56 y.o. MRN: [id number] ? ?CC: Hand Pain ? ? ?HPI ?Morgan Bean is a 56 y.o. year old female with a history of Hypertension, paroxysmal Afib, A-flutter (status post ablation) CAD, previous CVA, moderate to severe MR,Depression, polysubstance abuse. ? ?Interval History: ?She had a visit with the cardiology NP on 06/26/2021 and notes reviewed with plans for TEE/DCCV to reassess for left atrial thrombus. ? ?She had sent me a message regarding the need for preventive health exam but today informs me she is not ready for preventive health exam or labs but would like to rediscuss wrist pain. ?She has R hand and tingling radiating upr her R forearm with associated tingling ?Pain wakes her up nad she was unable to make a fist. It has occurred in her L hand with just tingling put no pain. A hand brace provided some relief. ? ?Past Medical History:  ?Diagnosis Date  ? Anemia of other chronic disease 11/13/2013  ? Anginal pain ([place])   ? none in past year  ? Anxiety   ? Asthma   ? patient denies  ? Candidiasis 06/23/2013  ? Chronic diastolic CHF (congestive heart failure) ([place])   ? Constipation   ? Coronary artery disease   ? Lexiscan myoview (10/12) with significant ST depression upon Lexiscan injection but no ischemia or infarction on perfusion images. Left heart cath (10/12): 30% mid LAD, 30% ostial D1, 50% ostial D2.    ? Cough 11/13/2013  ? Depression   ? Headache 02/22/2014  ? Hx of cardiovascular stress test   ? Lexiscan Myoview (2/16): Breast attenuation artifact, no ischemia, EF 64%; low risk  ? Hyperlipemia   ? Hypertension   ? Iron deficiency   ? hx of  ? Leukocytoclastic vasculitis ([place])   ? Rash across lower body, occurred in 9/11, diagnosed by skin biopsy. ANA positive. Thought to be secondary to cocaine use.  ? Leukopenia 03/21/2013  ? Lymphadenopathy 03/21/2013  ? Lymphadenopathy 01/30/2014  ? Mental disorder   ? Pancytopenia ([place])   ?  Pancytopenia, acquired ([place]) 07/16/2015  ? Polysubstance abuse ([place])   ? Prior cocaine  ? Pulmonary HTN ([place])   ? Echo (9/11) with EF 65%, mild LVH, mild AI, mild MR, moderate to possibly severe TR with PA systolic pressure 52 mmHg. Echo (10/12): severe LV hypertrophy, EF 60-65%, mild MR, mild AI, moderate to severe tricuspid regurgitation, PA systolic pressure 38 mmHg.  Echo 4/14: moderate LVH, EF 65%, normal wall motion, diastolic dysfunction, mild AI, mild MR  ? Shortness of breath   ? a. PFTs 5/14:  FEV1/FVC 99% predicted; FVC 60% predicted; DLCO mildly reduced, mild restriction, air trapping.;  b.  seen by pulmo (Dr. [name]) 5/14: mainly upper airway symptoms - ACE d/c'd (sx's better)  ? Stroke [facility]) 2010ish  ? Syphilis 04/25/2013  ? Tobacco abuse   ? Tricuspid regurgitation   ? Unspecified deficiency anemia 03/29/2013  ? ? ?Past Surgical History:  ?Procedure Laterality Date  ? ANKLE SURGERY    ? right  ? BONE MARROW ASPIRATION    ? CARDIAC CATHETERIZATION    ? CARDIAC CATHETERIZATION N/A 04/23/2015  ? Procedure: Right Heart Cath;  Surgeon: [name], MD;  Location: [place] [facility];  Service: Cardiovascular;  Laterality: N/A;  ? CARDIOVERSION N/A 03/11/2018  ? Procedure: CARDIOVERSION;  Surgeon: [name], MD;  Location: [place];  Service: Cardiovascular;  Laterality: N/A;  ?  I & D EXTREMITY  08/09/2011  ? Procedure: IRRIGATION AND DEBRIDEMENT EXTREMITY;  Surgeon: [name], MD;  Location: [place];  Service: General;  Laterality: Left;  i & D Left axilla abscess  ? LYMPH NODE BIOPSY N/A 04/05/2013  ? Procedure: EXCISIONAL BIOPSY RIGHT SUBMANDIBULAR NODE, NASAL ENDOSCOPIC WITH BIOPSY NASAL PHARYNX;  Surgeon: [name], MD;  Location: [place];  Service: ENT;  Laterality: N/A;  ? MULTIPLE EXTRACTIONS WITH ALVEOLOPLASTY N/A 03/03/2019  ? Procedure: MULTIPLE EXTRACTION WITH ALVEOLOPLASTY;  Surgeon: [name], DDS;  Location: [place];  Service: Oral Surgery;  Laterality: N/A;  ? TEE WITHOUT  CARDIOVERSION N/A 03/11/2018  ? Procedure: TRANSESOPHAGEAL ECHOCARDIOGRAM (TEE);  Surgeon: [name], MD;  Location: [place];  Service: Cardiovascular;  Laterality: N/A;  ? TEE WITHOUT CARDIOVERSION N/A 12/27/2020  ? Procedure: TRANSESOPHAGEAL ECHOCARDIOGRAM (TEE);  Surgeon: [name], DO;  Location: [place];  Service: Cardiovascular;  Laterality: N/A;  ? ? ?Family History  ?Problem Relation Age of Onset  ? Coronary artery disease Mother   ? Diabetes Mother   ? Diabetes Father   ? Hypertension Father   ? Coronary artery disease Father   ? Heart attack Father   ? Breast cancer Maternal Grandmother   ? Diabetes Maternal Grandmother   ? Heart attack Maternal Grandmother   ? Stroke Maternal Grandmother   ? Asthma Grandson   ? ? ?Social History  ? ?Socioeconomic History  ? Marital status: Single  ?  Spouse name: Not on file  ? Number of children: 2  ? Years of education: Not on file  ? Highest education level: GED or equivalent  ?Occupational History  ? Occupation: unemployed  ?Tobacco Use  ? Smoking status: Every Day  ?  Packs/day: 0.50  ?  Years: 39.00  ?  Pack years: 19.50  ?  Types: Cigarettes  ? Smokeless tobacco: Never  ?Vaping Use  ? Vaping Use: Never used  ?Substance and Sexual Activity  ? Alcohol use: Not Currently  ?  Comment: used in the past  ? Drug use: Yes  ?  Frequency: 7.0 times per week  ?  Types: "Crack" cocaine  ?  Comment: last use Monday 12/23/2020  ? Sexual activity: Not Currently  ?Other Topics Concern  ? Not on file  ?Social History Narrative  ? Lives with mom, sisters and brothers.  ? ?Social Determinants of Health  ? ?Financial Resource Strain: Medium Risk  ? Difficulty of Paying Living Expenses: Somewhat hard  ?Food Insecurity: No Food Insecurity  ? Worried About Charity fundraiser in the Last Year: Never true  ? Ran Out of Food in the Last Year: Never true  ?Transportation Needs: Unmet Transportation Needs  ? Lack of Transportation (Medical): Yes  ? Lack of Transportation  (Non-Medical): Yes  ?Physical Activity: Not on file  ?Stress: Not on file  ?Social Connections: Not on file  ? ? ?Allergies  ?Allergen Reactions  ? Ciprofloxacin Itching  ? ? ?Outpatient Medications Prior to Visit  ?Medication Sig Dispense Refill  ? albuterol (VENTOLIN HFA) 108 (90 Base) MCG/ACT inhaler Inhale 2 puffs into the lungs every 6 (six) hours as needed for wheezing or shortness of breath. 18 g 2  ? atorvastatin (LIPITOR) 40 MG tablet Take 1 tablet (40 mg total) by mouth at bedtime. 30 tablet 11  ? diltiazem (CARDIZEM CD) 300 MG 24 hr capsule Take 1 capsule (300 mg total) by mouth daily. 30 capsule 5  ? fluticasone (FLONASE)  50 MCG/ACT nasal spray Administer 2 sprays in each nostril daily. 16 g 11  ? furosemide (LASIX) 40 MG tablet Take 1 tablet (40 mg total) by mouth daily. 90 tablet 1  ? hydrOXYzine (ATARAX) 50 MG tablet Take 1 tablet (50 mg total) by mouth 3 (three) times daily. 30 tablet 1  ? lisinopril (ZESTRIL) 10 MG tablet Take 1 tablet (10 mg total) by mouth daily. 30 tablet 5  ? omeprazole (PRILOSEC) 40 MG capsule Take 1 capsule (40 mg total) by mouth daily. Take with water, wait 30-60 minutes after taking before eating or drinking anything but water 30 capsule 2  ? potassium chloride SA (KLOR-CON M) 20 MEQ tablet Take 1 tablet (20 mEq total) by mouth daily. 30 tablet 5  ? rivaroxaban (XARELTO) 20 MG TABS tablet Take 1 tablet (20 mg total) by mouth daily with supper. 30 tablet 5  ? spironolactone (ALDACTONE) 25 MG tablet Take 1 tablet (25 mg total) by mouth daily. 90 tablet 1  ? ?No facility-administered medications prior to visit.  ? ? ? ?ROS ?Review of Systems  ?Constitutional:  Negative for activity change, appetite change and fatigue.  ?HENT:  Negative for congestion, sinus pressure and sore throat.   ?Eyes:  Negative for visual disturbance.  ?Respiratory:  Negative for cough, chest tightness, shortness of breath and wheezing.   ?Cardiovascular:  Negative for chest pain and palpitations.   ?Gastrointestinal:  Negative for abdominal distention, abdominal pain and constipation.  ?Endocrine: Negative for polydipsia.  ?Genitourinary:  Negative for dysuria and frequency.  ?Musculoskeletal:   ?     See HPI  ?Skin:  [place]

## 2021-07-22 NOTE — Telephone Encounter (Signed)
You are scheduled for a TEE/Cardioversion on May 11th  with Dr. Harrington Challenger.  Please arrive at the [facility] (Main Entrance A) at [facility]: [address] [place], [place] [address] at 10am am. (1 hour prior to procedure unless lab work is needed; if lab work is needed arrive 1.5 hours ahead) ? ?DIET: Nothing to eat or drink after midnight except a sip of water with medications (see medication instructions below) ? ?FYI: For your safety, and to allow [place] to monitor your vital signs accurately during the surgery/procedure we request that   ?if you have artificial nails, gel coating, SNS etc. Please have those removed prior to your surgery/procedure. Not having the nail coverings /polish removed may result in cancellation or delay of your surgery/procedure. ? ? ?Medication Instructions: ?Hold Furosemide (lasix) the morning of the procedure ? ?Continue your anticoagulant: Xarelto ? ? ?Labs: Please come for labs on May 4th ? ? You may come to the...  ? ?[facility] (3rd floor) ?[address] [place], [place]  ?Open: 8am-Noon and 1pm-4:30pm  ? ?[facility] at [facility] ?[place]  ? ?[employer]- Any location ? ?**no appointments needed** ? ? ?You must have a responsible person to drive you home and stay in the waiting area during your procedure. Failure to do so could result in cancellation. ? ?Interior and spatial designer cards. ? ?*Special Note: Every effort is made to have your procedure done on time. Occasionally there are emergencies that occur at the hospital that may cause delays. Please be patient if a delay does occur.  ? ?

## 2021-07-24 ENCOUNTER — Encounter (HOSPITAL_COMMUNITY): Payer: Self-pay | Admitting: Internal Medicine

## 2021-07-24 ENCOUNTER — Encounter (HOSPITAL_BASED_OUTPATIENT_CLINIC_OR_DEPARTMENT_OTHER): Payer: Self-pay

## 2021-07-24 ENCOUNTER — Telehealth (INDEPENDENT_AMBULATORY_CARE_PROVIDER_SITE_OTHER): Payer: Medicaid Other | Admitting: Family

## 2021-07-24 ENCOUNTER — Telehealth (HOSPITAL_BASED_OUTPATIENT_CLINIC_OR_DEPARTMENT_OTHER): Payer: Self-pay | Admitting: *Deleted

## 2021-07-24 ENCOUNTER — Telehealth (HOSPITAL_BASED_OUTPATIENT_CLINIC_OR_DEPARTMENT_OTHER): Payer: Medicaid Other | Admitting: Family

## 2021-07-24 DIAGNOSIS — I483 Typical atrial flutter: Secondary | ICD-10-CM

## 2021-07-24 DIAGNOSIS — I4819 Other persistent atrial fibrillation: Secondary | ICD-10-CM

## 2021-07-24 NOTE — Telephone Encounter (Signed)
Patient no showed her video visit on 5/4, 3 call attempts,patient needs updated visit to avoid cancellation of upcoming TEECardioversion, will attempt to send patient a mychart message.   ?

## 2021-07-24 NOTE — Telephone Encounter (Signed)
Call attempt, no answer, unable to leave message  ?

## 2021-07-24 NOTE — Progress Notes (Signed)
Attempted to obtain medical history via telephone, unable to reach at this time. I left a voicemail to return pre surgical testing department's phone call.  

## 2021-07-24 NOTE — Telephone Encounter (Signed)
Noted. Patient appropriately marked as no-show. Will need visit (virtual or in person) prior to her DCCV 07/31/21 to update HPI. Otherwise cardioversion will likely need to be cancelled. ? ?Will you try to reach out to her? TY! ? ?Loel Dubonnet, NP  ? ? ?

## 2021-07-24 NOTE — Telephone Encounter (Signed)
Phoned pt 3 times to get video visit started with no answer. Left message on her voicemail twice. ?

## 2021-07-25 ENCOUNTER — Other Ambulatory Visit: Payer: Self-pay | Admitting: Physician Assistant

## 2021-07-25 DIAGNOSIS — F1410 Cocaine abuse, uncomplicated: Secondary | ICD-10-CM

## 2021-07-25 NOTE — Telephone Encounter (Signed)
Call attempt to get patient scheduled, no answer, unable to leave message, no response to mychart message  ?

## 2021-07-26 NOTE — Progress Notes (Signed)
No show - no charge encounter.  ? ?Loel Dubonnet, NP  ?

## 2021-07-28 ENCOUNTER — Telehealth (HOSPITAL_BASED_OUTPATIENT_CLINIC_OR_DEPARTMENT_OTHER): Payer: Self-pay

## 2021-07-28 ENCOUNTER — Other Ambulatory Visit: Payer: Self-pay

## 2021-07-28 ENCOUNTER — Encounter (HOSPITAL_BASED_OUTPATIENT_CLINIC_OR_DEPARTMENT_OTHER): Payer: Self-pay

## 2021-07-28 MED ORDER — HYDROXYZINE HCL 50 MG PO TABS
50.0000 mg | ORAL_TABLET | Freq: Three times a day (TID) | ORAL | 1 refills | Status: DC
  Filled 2021-07-28 – 2021-08-06 (×2): qty 30, 10d supply, fill #0
  Filled 2021-08-25 – 2021-09-09 (×2): qty 30, 10d supply, fill #1

## 2021-07-28 NOTE — Telephone Encounter (Addendum)
Repeated attempts to reach patient about lab work and virtual appointment, no answer, mychart message also not answered. Repeat message sent and cardioversion cancelled.  ? ? ?----- Message from Loel Dubonnet, NP sent at 07/26/2021  7:59 AM EDT ----- ?Regarding: DCCV ?If labs have not resulted by 07/29/21 and she has not had office visit - will need to cancel cardioversion. ? ?She can be scheduled for in clinic visit or video visit when I return from vacation. She is a Dr. Oval Linsey patient so could be scheduled with her if Dr. Oval Linsey okay with it.  ? ?Loel Dubonnet, NP   ? ?

## 2021-07-28 NOTE — Telephone Encounter (Signed)
Requested Prescriptions  ?Pending Prescriptions Disp Refills  ?? hydrOXYzine (ATARAX) 50 MG tablet 30 tablet 1  ?  Sig: Take 1 tablet (50 mg total) by mouth 3 (three) times daily.  ?  ? Ear, Nose, and Throat:  Antihistamines 2 Failed - 07/25/2021  7:49 PM  ?  ?  Failed - Cr in normal range and within 360 days  ?  Creatinine  ?Date Value Ref Range Status  ?03/06/2014 1.1 0.6 - 1.1 mg/dL Final  ? ?Creat  ?Date Value Ref Range Status  ?04/23/2014 1.01 0.50 - 1.10 mg/dL Final  ? ?Creatinine, Ser  ?Date Value Ref Range Status  ?05/08/2021 1.43 (H) 0.57 - 1.00 mg/dL Final  ?   ?  ?  Passed - Valid encounter within last 12 months  ?  Recent Outpatient Visits   ?      ? 6 days ago Bilateral carpal tunnel syndrome  ? [place], Charlane Ferretti, MD  ? 2 months ago COVID-19 virus infection  ? [place], [place], [place]  ? 7 months ago Hypertensive heart disease with chronic combined systolic and diastolic congestive heart failure ([place])  ? [place], Charlane Ferretti, MD  ? 7 months ago Atrial flutter with rapid ventricular response ([place])  ? [place], Charlane Ferretti, MD  ? 10 months ago Hoarseness of voice  ? [place] [name], MD  ?  ?  ?Future Appointments   ?        ? In 1 month Walker, Martie Lee, NP [facility], [facility]  ?  ? ?  ?  ?  ? ?

## 2021-07-31 ENCOUNTER — Encounter (HOSPITAL_COMMUNITY): Admission: RE | Payer: Self-pay | Source: Home / Self Care

## 2021-07-31 ENCOUNTER — Ambulatory Visit (HOSPITAL_COMMUNITY): Admission: RE | Admit: 2021-07-31 | Payer: Medicaid Other | Source: Home / Self Care | Admitting: Internal Medicine

## 2021-07-31 SURGERY — ECHOCARDIOGRAM, TRANSESOPHAGEAL
Anesthesia: General

## 2021-08-04 ENCOUNTER — Other Ambulatory Visit: Payer: Self-pay

## 2021-08-06 ENCOUNTER — Other Ambulatory Visit: Payer: Self-pay

## 2021-08-07 ENCOUNTER — Encounter: Payer: Self-pay | Admitting: Orthopedic Surgery

## 2021-08-07 ENCOUNTER — Ambulatory Visit (INDEPENDENT_AMBULATORY_CARE_PROVIDER_SITE_OTHER): Payer: Medicaid Other | Admitting: Orthopedic Surgery

## 2021-08-07 DIAGNOSIS — R202 Paresthesia of skin: Secondary | ICD-10-CM | POA: Diagnosis not present

## 2021-08-07 DIAGNOSIS — R200 Anesthesia of skin: Secondary | ICD-10-CM | POA: Diagnosis not present

## 2021-08-07 NOTE — Progress Notes (Signed)
Office Visit Note   Patient: Morgan Bean           Date of Birth: Sep 12, 1965           MRN: [id number] Visit Date: 08/07/2021              Requested by: [name], MD [place],  [place] [address] PCP: [name], MD   Assessment & Plan: Visit Diagnoses:  1. Numbness and tingling in both hands     Plan: Discussed with patient that she does have some history and exam findings consistent with carpal tunnel syndrome.  We discussed trying night splints bilaterally to hopefully improve her nocturnal symptoms.  I also like to get an EMG/nerve conduction study to further evaluate her symptoms.  I can see her back in the office once the electrodiagnostic study is completed.  Follow-Up Instructions: No follow-ups on file.   Orders:  Orders Placed This Encounter  Procedures   Ambulatory referral to Physical Medicine Rehab   No orders of the defined types were placed in this encounter.     Procedures: No procedures performed   Clinical Data: No additional findings.   Subjective: Chief Complaint  Patient presents with   Left Hand - New Patient (Initial Visit)   Right Hand - New Patient (Initial Visit)    This is a 56 year old right-hand-dominant female who presents with numbness and paresthesias involving both hands.  Is been going on for 4 to 6 weeks at this point.  The right is more affected than the left.  She has a difficult time describing the sensation but does note that the right hand feels numb in all of her fingers.  She says both hands feels stiff.  On the left side, she describes numbness and tingling that goes from the fingertips all the way up to her upper arm.  She notes that she is dropping things recently but is unsure if this is secondary to numbness or weakness.  She has nocturnal symptoms 6 or 7 nights per week in which she wakes up with pain and an uncomfortable sensation in both of her hands.  She had no treatment to date.   She had no electrodiagnostic studies.  She does have quite a complicated medical history including COPD, stage III CKD, polysubstance abuse, prior CVA, CHF, pulmonary hypertension, and CAD.   Review of Systems   Objective: Vital Signs: LMP 05/10/2013   Physical Exam Constitutional:      Comments: Patient appears restless/jittery  Cardiovascular:     Rate and Rhythm: Normal rate.     Pulses: Normal pulses.  Pulmonary:     Effort: Pulmonary effort is normal.  Skin:    General: Skin is warm and dry.     Capillary Refill: Capillary refill takes less than 2 seconds.  Neurological:     Mental Status: She is alert.    Right Hand Exam   Tenderness  The patient is experiencing no tenderness.   Range of Motion  The patient has normal right wrist ROM.   Other  Erythema: absent Sensation: normal Pulse: present  Comments:  Positive Tinel, Phalen, and Durkan signs.  4/5 thenar motor strength.  2PD ~44m in thumb, 638min remaining fingers.    Left Hand Exam   Tenderness  The patient is experiencing no tenderness.   Range of Motion  The patient has normal left wrist ROM.  Other  Erythema: absent Sensation: normal Pulse: present  Comments:  Positive Tinel and Durkan signs.  Negative Phalen sign.  5/5 thenar motor strength.  2PD 39m in all fingers.      Specialty Comments:  No specialty comments available.  Imaging: No results found.   PMFS History: Patient Active Problem List   Diagnosis Date Noted   COVID-19 virus infection 04/14/2021   Hypokalemia 04/14/2021   Chronic kidney disease, stage 3a ([place] 04/14/2021   Elevated troponin 12/25/2020   Dysphagia 12/25/2020   Acute on chronic congestive heart failure ([name])    Cocaine abuse ([place]    Atrial flutter with rapid ventricular response ([facility] 05/28/2020   Autoimmune disease ([facility] [facility] 12/27/2018   Laryngitis, chronic 12/26/2018   Febrile neutropenia ([facility] 12/16/2018   Chronic anticoagulation 10/14/2018   CRI  (chronic renal insufficiency), stage 3 (moderate) ([name]) 10/14/2018   Upper airway cough syndrome 06/30/2018   Tobacco dependence in remission 04/11/2018   Substance abuse in remission ([place] [facility] 04/11/2018   Depression, recurrent ([facility] 04/11/2018   PAF (paroxysmal atrial fibrillation) ([facility] 03/10/2018   Acute pulmonary edema ([name])    Persistent atrial fibrillation with RVR ([name])    Nonrheumatic mitral valve regurgitation    Acute on chronic diastolic CHF (congestive heart failure) ([facility] 03/08/2018   Hypertensive urgency 03/08/2018   Acute respiratory failure with hypoxia ([facility] 03/08/2018   Unspecified atrial flutter ([facility] 04/25/2016   Tobacco use 11/30/2015   Pancytopenia, acquired ([facility] 07/16/2015   Head and neck lymphadenopathy 07/16/2015   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 05/23/18  05/08/2015   Absolute anemia 02/25/2015   Preventive measure 12/27/2014   Primary stabbing headache 12/26/2014   History of stroke 12/26/2014   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   Headache 02/22/2014   Dental caries 01/09/2014   Cough 11/13/2013   Cigarette smoker 11/13/2013   Swelling of gums 11/13/2013   Fatigue 11/10/2013   Intracranial atherosclerosis 11/10/2013   Hyperreflexia 10/04/2013   TIA (transient ischemic attack) 10/04/2013   Fibroid uterus 09/14/2012   Perimenopause 09/14/2012   Chronic cough 08/12/2012   DOE (dyspnea on exertion) 08/12/2012   History of CVA (cerebrovascular accident) 08/09/2012   Pulmonary hypertension (H[place] 04/06/2012   Substance abuse ([facility] 04/06/2012   History of cocaine abuse (HF[place] 11/16/2011   Major depressive disorder, recurrent episode with psychotic features. 11/15/2011    Class: Acute   Chronic diastolic heart failure ([facility] 02/24/2011   CAD (coronary artery disease) 02/24/2011   Hyperlipidemia 01/06/2011   Essential hypertension 12/15/2010   Exertional dyspnea 12/15/2010   Leukocytoclastic vasculitis (HH[place] 03/24/2007   Past Medical  History:  Diagnosis Date   Anemia of other chronic disease 11/13/2013   Anginal pain ([name])    none in past year   Anxiety    Asthma    patient denies   Candidiasis 06/23/2013   Chronic diastolic CHF (congestive heart failure) ([name])    Constipation    Coronary artery disease    Lexiscan myoview (10/12) with significant ST depression upon Lexiscan injection but no ischemia or infarction on perfusion images. Left heart cath (10/12): 30% mid LAD, 30% ostial D1, 50% ostial D2.     Cough 11/13/2013   Depression    Headache 02/22/2014   Hx of cardiovascular stress test    Lexiscan Myoview (2/16): Breast attenuation artifact, no ischemia, EF 64%; low risk   Hyperlipemia    Hypertension    Iron deficiency    hx of   Leukocytoclastic vasculitis ([name])    Rash across lower body, occurred in 9/11, diagnosed  by skin biopsy. ANA positive. Thought to be secondary to cocaine use.   Leukopenia 03/21/2013   Lymphadenopathy 03/21/2013   Lymphadenopathy 01/30/2014   Mental disorder    Pancytopenia ([place])    Pancytopenia, acquired ([place]) 07/16/2015   Polysubstance abuse ([place])    Prior cocaine   Pulmonary HTN ([place])    Echo (9/11) with EF 65%, mild LVH, mild AI, mild MR, moderate to possibly severe TR with PA systolic pressure 52 mmHg. Echo (10/12): severe LV hypertrophy, EF 60-65%, mild MR, mild AI, moderate to severe tricuspid regurgitation, PA systolic pressure 38 mmHg.  Echo 4/14: moderate LVH, EF 65%, normal wall motion, diastolic dysfunction, mild AI, mild MR   Shortness of breath    a. PFTs 5/14:  FEV1/FVC 99% predicted; FVC 60% predicted; DLCO mildly reduced, mild restriction, air trapping.;  b.  seen by pulmo (Dr. [name]) 5/14: mainly upper airway symptoms - ACE d/c'd (sx's better)   Stroke [facility]) 2010ish   Syphilis 04/25/2013   Tobacco abuse    Tricuspid regurgitation    Unspecified deficiency anemia 03/29/2013    Family History  Problem Relation Age of Onset   Coronary artery disease Mother     Diabetes Mother    Diabetes Father    Hypertension Father    Coronary artery disease Father    Heart attack Father    Breast cancer Maternal Grandmother    Diabetes Maternal Grandmother    Heart attack Maternal Grandmother    Stroke Maternal Grandmother    Asthma Grandson     Past Surgical History:  Procedure Laterality Date   ANKLE SURGERY     right   BONE MARROW ASPIRATION     CARDIAC CATHETERIZATION     CARDIAC CATHETERIZATION N/A 04/23/2015   Procedure: Right Heart Cath;  Surgeon: [name], MD;  Location: [place] [facility];  Service: Cardiovascular;  Laterality: N/A;   CARDIOVERSION N/A 03/11/2018   Procedure: CARDIOVERSION;  Surgeon: [name], MD;  Location: [facility] ENDOSCOPY;  Service: Cardiovascular;  Laterality: N/A;   I & D EXTREMITY  08/09/2011   Procedure: IRRIGATION AND DEBRIDEMENT EXTREMITY;  Surgeon: [name], MD;  Location: [place];  Service: General;  Laterality: Left;  i & D Left axilla abscess   LYMPH NODE BIOPSY N/A 04/05/2013   Procedure: EXCISIONAL BIOPSY RIGHT SUBMANDIBULAR NODE, NASAL ENDOSCOPIC WITH BIOPSY NASAL PHARYNX;  Surgeon: [name], MD;  Location: [place];  Service: ENT;  Laterality: N/A;   MULTIPLE EXTRACTIONS WITH ALVEOLOPLASTY N/A 03/03/2019   Procedure: MULTIPLE EXTRACTION WITH ALVEOLOPLASTY;  Surgeon: [name], DDS;  Location: [place];  Service: Oral Surgery;  Laterality: N/A;   TEE WITHOUT CARDIOVERSION N/A 03/11/2018   Procedure: TRANSESOPHAGEAL ECHOCARDIOGRAM (TEE);  Surgeon: [name], MD;  Location: [facility];  Service: Cardiovascular;  Laterality: N/A;   TEE WITHOUT CARDIOVERSION N/A 12/27/2020   Procedure: TRANSESOPHAGEAL ECHOCARDIOGRAM (TEE);  Surgeon: [name], DO;  [facility];  Service: Cardiovascular;  Laterality: N/A;   Social History   Occupational History   Occupation: unemployed  Tobacco Use   Smoking status: Every Day    Packs/day: 0.50    Years: 39.00    Pack years: 19.50    Types:  Cigarettes   Smokeless tobacco: Never  Vaping Use   Vaping Use: Never used  Substance and Sexual Activity   Alcohol use: Not Currently    Comment: used in the past   Drug use: Yes    Frequency: 7.0 times  per week    Types: "Crack" cocaine    Comment: last use Monday 12/23/2020   Sexual activity: Not Currently

## 2021-08-25 ENCOUNTER — Other Ambulatory Visit: Payer: Self-pay

## 2021-08-26 ENCOUNTER — Other Ambulatory Visit: Payer: Self-pay

## 2021-08-26 MED ORDER — OMEPRAZOLE 40 MG PO CPDR
DELAYED_RELEASE_CAPSULE | ORAL | 2 refills | Status: DC
  Filled 2021-08-26: qty 30, 30d supply, fill #0
  Filled 2021-10-08: qty 30, 30d supply, fill #1
  Filled 2021-11-10: qty 30, 30d supply, fill #2

## 2021-08-28 ENCOUNTER — Encounter (HOSPITAL_BASED_OUTPATIENT_CLINIC_OR_DEPARTMENT_OTHER): Payer: Self-pay

## 2021-08-28 ENCOUNTER — Ambulatory Visit (HOSPITAL_BASED_OUTPATIENT_CLINIC_OR_DEPARTMENT_OTHER): Payer: Medicaid Other | Admitting: Family

## 2021-08-29 ENCOUNTER — Other Ambulatory Visit: Payer: Self-pay

## 2021-09-09 ENCOUNTER — Other Ambulatory Visit: Payer: Self-pay

## 2021-10-08 ENCOUNTER — Other Ambulatory Visit: Payer: Self-pay

## 2021-10-09 ENCOUNTER — Other Ambulatory Visit: Payer: Self-pay

## 2021-10-14 ENCOUNTER — Other Ambulatory Visit: Payer: Self-pay

## 2021-10-14 ENCOUNTER — Encounter (HOSPITAL_BASED_OUTPATIENT_CLINIC_OR_DEPARTMENT_OTHER): Payer: Self-pay | Admitting: Family

## 2021-10-14 ENCOUNTER — Ambulatory Visit (INDEPENDENT_AMBULATORY_CARE_PROVIDER_SITE_OTHER): Payer: Medicaid Other | Admitting: Family

## 2021-10-14 VITALS — BP 124/92 | HR 92 | Ht 66.0 in | Wt 188.0 lb

## 2021-10-14 DIAGNOSIS — I4819 Other persistent atrial fibrillation: Secondary | ICD-10-CM | POA: Diagnosis not present

## 2021-10-14 DIAGNOSIS — I10 Essential (primary) hypertension: Secondary | ICD-10-CM | POA: Diagnosis not present

## 2021-10-14 DIAGNOSIS — I5032 Chronic diastolic (congestive) heart failure: Secondary | ICD-10-CM

## 2021-10-14 DIAGNOSIS — D6859 Other primary thrombophilia: Secondary | ICD-10-CM | POA: Diagnosis not present

## 2021-10-14 MED ORDER — DILTIAZEM HCL ER COATED BEADS 360 MG PO CP24
360.0000 mg | ORAL_CAPSULE | Freq: Every day | ORAL | 1 refills | Status: DC
  Filled 2021-10-14 – 2021-10-21 (×2): qty 30, 30d supply, fill #0
  Filled 2021-12-12: qty 30, 30d supply, fill #1
  Filled 2022-01-21 – 2022-02-09 (×2): qty 30, 30d supply, fill #2
  Filled 2022-03-19 – 2022-04-01 (×2): qty 30, 30d supply, fill #3
  Filled 2022-05-29: qty 30, 30d supply, fill #4
  Filled 2022-08-20: qty 30, 30d supply, fill #5

## 2021-10-14 NOTE — Progress Notes (Signed)
Office Visit    Patient Name: Morgan Bean Date of Encounter: 10/14/2021  PCP:  [name], MD   [place]  Cardiologist:  [name], MD  Advanced Practice Provider:  No care team member to display Electrophysiologist:  None      Chief Complaint    Morgan Bean is a 56 y.o. female with a hx of left atrial appendage thrombus, atrial flutter post ablation with recurrent atrial flutter/fib, cocaine use, anxiety, hypertension CKDIIIa, chronic diastolic heart failure, prior CVA, mild pulmonary hypertension, COPD, OSA not on CPAP, depression presents today for follow-up of atrial fibrillation  Past Medical History    Past Medical History:  Diagnosis Date   Anemia of other chronic disease 11/13/2013   Anginal pain ([place])    none in past year   Anxiety    Asthma    patient denies   Candidiasis 06/23/2013   Chronic diastolic CHF (congestive heart failure) ([place])    Constipation    Coronary artery disease    Lexiscan myoview (10/12) with significant ST depression upon Lexiscan injection but no ischemia or infarction on perfusion images. Left heart cath (10/12): 30% mid LAD, 30% ostial D1, 50% ostial D2.     Cough 11/13/2013   Depression    Headache 02/22/2014   Hx of cardiovascular stress test    Lexiscan Myoview (2/16): Breast attenuation artifact, no ischemia, EF 64%; low risk   Hyperlipemia    Hypertension    Iron deficiency    hx of   Leukocytoclastic vasculitis ([name])    Rash across lower body, occurred in 9/11, diagnosed by skin biopsy. ANA positive. Thought to be secondary to cocaine use.   Leukopenia 03/21/2013   Lymphadenopathy 03/21/2013   Lymphadenopathy 01/30/2014   Mental disorder    Pancytopenia ([place])    Pancytopenia, acquired ([place]) 07/16/2015   Polysubstance abuse ([place])    Prior cocaine   Pulmonary HTN ([place])    Echo (9/11) with EF 65%, mild LVH, mild AI, mild MR, moderate to possibly severe TR with PA systolic pressure 52 mmHg.  Echo (10/12): severe LV hypertrophy, EF 60-65%, mild MR, mild AI, moderate to severe tricuspid regurgitation, PA systolic pressure 38 mmHg.  Echo 4/14: moderate LVH, EF 65%, normal wall motion, diastolic dysfunction, mild AI, mild MR   Shortness of breath    a. PFTs 5/14:  FEV1/FVC 99% predicted; FVC 60% predicted; DLCO mildly reduced, mild restriction, air trapping.;  b.  seen by pulmo (Dr. [name]) 5/14: mainly upper airway symptoms - ACE d/c'd (sx's better)   Stroke [facility]) 2010ish   Syphilis 04/25/2013   Tobacco abuse    Tricuspid regurgitation    Unspecified deficiency anemia 03/29/2013   Past Surgical History:  Procedure Laterality Date   ANKLE SURGERY     right   BONE MARROW ASPIRATION     CARDIAC CATHETERIZATION     CARDIAC CATHETERIZATION N/A 04/23/2015   Procedure: Right Heart Cath;  Surgeon: [name], MD;  Location: [facility] CV LAB;  Service: Cardiovascular;  Laterality: N/A;   CARDIOVERSION N/A 03/11/2018   Procedure: CARDIOVERSION;  Surgeon: [name], MD;  Location: [facility] ENDOSCOPY;  Service: Cardiovascular;  Laterality: N/A;   I & D EXTREMITY  08/09/2011   Procedure: IRRIGATION AND DEBRIDEMENT EXTREMITY;  Surgeon: [name], MD;  Location: [place];  Service: General;  Laterality: Left;  i & D Left axilla abscess   LYMPH NODE BIOPSY N/A 04/05/2013   Procedure: EXCISIONAL  BIOPSY RIGHT SUBMANDIBULAR NODE, NASAL ENDOSCOPIC WITH BIOPSY NASAL PHARYNX;  Surgeon: [name], MD;  Location: [place];  Service: ENT;  Laterality: N/A;   MULTIPLE EXTRACTIONS WITH ALVEOLOPLASTY N/A 03/03/2019   Procedure: MULTIPLE EXTRACTION WITH ALVEOLOPLASTY;  Surgeon: [name], DDS;  Location: [place];  Service: Oral Surgery;  Laterality: N/A;   TEE WITHOUT CARDIOVERSION N/A 03/11/2018   Procedure: TRANSESOPHAGEAL ECHOCARDIOGRAM (TEE);  Surgeon: [name], MD;  Location: [place];  Service: Cardiovascular;  Laterality: N/A;   TEE WITHOUT CARDIOVERSION N/A 12/27/2020   Procedure:  TRANSESOPHAGEAL ECHOCARDIOGRAM (TEE);  Surgeon: [name], DO;  Location: [facility] ENDOSCOPY;  Service: Cardiovascular;  Laterality: N/A;    Allergies  Allergies  Allergen Reactions   Ciprofloxacin Itching    History of Present Illness    Morgan Bean is a 56 y.o. female with a hx of left atrial appendage thrombus, atrial flutter post ablation with recurrent atrial flutter/fib, cocaine use, anxiety, hypertension CKDIIIa, chronic diastolic heart failure, prior CVA, mild pulmonary hypertension, COPD, OSA not on CPAP, depression last seen 06/26/2021  Admitted 02/2018 with acute on chronic diastolic heart failure and atrial fib/flutter with RVR. Echo with LVEF 50-55%, mild AI, moderate to severe MR. Diltiazem and Xarelto started.   ED visit 04/2018 due to atrial flutter. Cardiac enzymes unremarkable, CXR normal, urine tox positive for amphetamines. She had cardioversion but did not maintain NSR. She was subsequently started on Amiodarone and maintained NSR. Last seen in clinic 12/2018.  She was readmitted 12/2020 with community acquired pneumonia and atrial flutter. Positive for cocaine on admission. TEE showed left atrial appendage thrombus so cardioversion could not be pursued. LVEF 55-60%, RV normal size/function, mild to moderate MR, moderate AI. She was discharged on Xarelto, Amiodarone, Diltiazem with plans to schedule outpatient cardioversion.   She was lost to follow-up until 04/10/2021. Found to be in atrial fibrillation 140 bpm.  Diltiazem 300 mg daily, Xarelto 20 mg daily were resumed.  Subsequently admitted 1/22 - 04/17/2021 with A-fib RVR, acute on chronic diastolic heart failure, [id number].  She had used cocaine 1 day prior to hospitalization.  Echo 04/14/2021 LVEF 60 to 65%, no RWMA, bilateral atria moderately dilated, mild MR.  At follow-up 06/26/2021 she was set up for TEE and cardioversion but she canceled procedure.    Seen by Dr. Tempie Donning 08/07/21 with concern for carpal tunnel  recommended for nerve studies.   Presents today for follow-up independently.  Notes that since last seen she has moved back in with her mother and now no longer has daily access to cocaine.  Has significantly reduced her intake of cigarettes now only 1 to 2 per day.  She feels much better with heart rate maintaining in the 90s and blood pressure often less than 130/80 when checked with her wrist cuff at home.  She and her mother are planning to start exercising at [facility]. Reports exertional dyspnea which she attributes to the hot weather but no shortness of breath at rest. No orthopnea, PND, edema.  Only palpitations 3-4 times per week that last for seconds.  No lightheadedness, dizziness, near-syncope, syncope.  EKGs/Labs/Other Studies Reviewed:   The following studies were reviewed today:  TEE 12/27/20  1. Left ventricular ejection fraction, by estimation, is 55 to 60%. The  left ventricle has normal function.   2. Right ventricular systolic function is normal. The right ventricular  size is normal.   3. A left atrial/left atrial appendage thrombus was detected. The LAA  emptying velocity was 22 cm/s.  4. The mitral valve is normal in structure. Mild to moderate mitral valve  regurgitation.   5. The aortic valve is tricuspid. Aortic valve regurgitation is moderate.   Echo 12/25/20  1. Left ventricular ejection fraction, by estimation, is 60 to 65%. The  left ventricle has normal function. The left ventricle has no regional  wall motion abnormalities. There is mild left ventricular hypertrophy.  Left ventricular diastolic function  could not be evaluated.   2. Right ventricular systolic function is normal. The right ventricular  size is normal. There is normal pulmonary artery systolic pressure. The  estimated right ventricular systolic pressure is 27.7 mmHg.   3. Left atrial size was severely dilated.   4. Right atrial size was severely dilated.   5. The mitral valve is abnormal.  Moderate mitral valve regurgitation.  Moderate mitral annular calcification.   6. The tricuspid valve is abnormal. Tricuspid valve regurgitation is  moderate.   7. The aortic valve is tricuspid. Aortic valve regurgitation is moderate.  Mild aortic valve sclerosis is present, with no evidence of aortic valve  stenosis. Aortic regurgitation PHT measures 498 msec.   8. The inferior vena cava is normal in size with <50% respiratory  variability, suggesting right atrial pressure of 8 mmHg.   Comparison(s): Changes from prior study are noted. 01/25/2019: LVEF 60-65%,  mild MR, mild to moderate AI, mild TR.   EKG:  EKG is ordered today.  The ekg ordered today demonstrates atrial fib 92 bpm with n stable T wave inversion in lead V6, no acute ST/T wave changes.  Recent Labs: 12/17/2020: NT-Pro BNP 2,714 12/25/2020: TSH 2.038 12/27/2020: Magnesium 2.2 04/13/2021: B Natriuretic Peptide 1,212.6 05/08/2021: ALT 12; BUN 29; Creatinine, Ser 1.43; Hemoglobin 12.7; Platelets 217; Potassium 4.3; Sodium 136  Recent Lipid Panel    Component Value Date/Time   CHOL 152 12/26/2020 0232   TRIG 51 12/26/2020 0232   HDL 48 12/26/2020 0232   CHOLHDL 3.2 12/26/2020 0232   VLDL 10 12/26/2020 0232   LDLCALC 94 12/26/2020 0232   LDLDIRECT 142.1 12/25/2010 0846    Risk Assessment/Calculations:   CHA2DS2-VASc Score = 4   This indicates a 4.8% annual risk of stroke. The patient's score is based upon: CHF History: 1 HTN History: 1 Diabetes History: 0 Stroke History: 0 Vascular Disease History: 1 Age Score: 0 Gender Score: 1  Home Medications   Current Meds  Medication Sig   albuterol (VENTOLIN HFA) 108 (90 Base) MCG/ACT inhaler Inhale 2 puffs into the lungs every 6 (six) hours as needed for wheezing or shortness of breath.   atorvastatin (LIPITOR) 40 MG tablet Take 1 tablet (40 mg total) by mouth at bedtime.   diltiazem (CARDIZEM CD) 300 MG 24 hr capsule Take 1 capsule (300 mg total) by mouth daily.    fluticasone (FLONASE) 50 MCG/ACT nasal spray Administer 2 sprays in each nostril daily.   furosemide (LASIX) 40 MG tablet Take 1 tablet (40 mg total) by mouth daily.   hydrOXYzine (ATARAX) 50 MG tablet Take 1 tablet (50 mg total) by mouth 3 (three) times daily.   lisinopril (ZESTRIL) 10 MG tablet Take 1 tablet (10 mg total) by mouth daily.   omeprazole (PRILOSEC) 40 MG capsule Take 1 capsule (40 mg total) by mouth daily. Take with water, wait 30-60 minutes after taking before eating or drinking anything but water   potassium chloride SA (KLOR-CON M) 20 MEQ tablet Take 1 tablet (20 mEq total) by mouth daily.   rivaroxaban (XARELTO)  20 MG TABS tablet Take 1 tablet (20 mg total) by mouth daily with supper.   spironolactone (ALDACTONE) 25 MG tablet Take 1 tablet (25 mg total) by mouth once daily.     Review of Systems     All other systems reviewed and are otherwise negative except as noted above.  Physical Exam    VS:  BP (!) 124/92   Pulse 92   Ht '5\' 6"'$  (1.676 m)   Wt 188 lb (85.3 kg)   LMP 04/24/2013 Comment: states periods come and go  BMI 30.34 kg/m  , BMI Body mass index is 30.34 kg/m.  Wt Readings from Last 3 Encounters:  10/14/21 188 lb (85.3 kg)  07/22/21 183 lb 3.2 oz (83.1 kg)  06/26/21 182 lb 11.2 oz (82.9 kg)    GEN: Well nourished, well developed, in no acute distress. HEENT: normal. Neck: Supple, no JVD, carotid bruits, or masses. Cardiac: , irregularly irregular, no  rubs, or gallops. No clubbing, cyanosis, edema. Gr2/6 murmur.  Radials/PT 2+ and equal bilaterally.  Respiratory:  Respirations regular and unlabored, clear to auscultation bilaterally. GI: Soft, nontender, nondistended. MS: No deformity or atrophy. Skin: Warm and dry, no rash. Neuro:  Strength and sensation are intact. Psych: Normal affect.  Assessment & Plan    Atrial flutter / Atrial fibrillation / LAA thrombus - Ablative therapy in 2018. Admission 12/2020 with recurrent atrial flutter and LAA  thrombus unable to be cardioverted.  Rate controlled today on Diltiazem '300mg'$  QD.  She has had some elevated rates at home with her resting heart rate greater than 100 bpm we will increase diltiazem to 360 mg daily.  Avoid beta-blocker due to previous cocaine use.  Continue Xarelto '20mg'$  QD.  Denies bleeding complications.  CHA2DS2-VASc Score = 4 [CHF History: 1, HTN History: 1, Diabetes History: 0, Stroke History: 0, Vascular Disease History: 1, Age Score: 0, Gender Score: 1].  Therefore, the patient's annual risk of stroke is 4.8 %.  She has cancelled multiple previous cardioversions.  She notices palpitations a few times per week that last only seconds.  I do think it would be reasonable to proceed with rate control.  Adjust diltiazem to 360 mg daily.  Follow-up in 3 months.    HTN - BP well controlled today. Continue current medications.  Cocaine use - Complete cessation encouraged.  Has not used in 2 months that she has been living with her mother.  Is working to reduce her intake of cigarettes.  Resources provided for coping with quitting, substance abuse and mental health helpline.  Encouraged to going to support group.  CKDIIIa - Careful titration of diuretic and antihypertensive.    Diastolic heart failure -Euvolemic on exam with no edema. Continue Lasix '40mg'$  QD. Low sodium diet, fluid restriction <2L, and daily weights encouraged. Educated to contact our office for weight gain of 2 lbs overnight or 5 lbs in one week.   COPD - Continue to follow with PCP.   Disposition: Follow up in 2-3 months with [name], MD or APP.  Signed, Loel Dubonnet, NP 10/14/2021, 10:59 AM [place]

## 2021-10-14 NOTE — Patient Instructions (Addendum)
Medication Instructions:  Your physician has recommended you make the following change in your medication:   INCREASE Diltiazem (Cardizem) to '360mg'$  daily   *If you need a refill on your cardiac medications before your next appointment, please call your pharmacy*  Lab Work: None ordered today.   Testing/Procedures: Your EKG today showed atrial fibrillation with a rate of 92 bpm. This is a irregular heart rate BUT it is much better heart rate than previous. For now - we will focus on rate control of your atrial fibrillation. Our goal is for your resting heart rate to be less than 100 bpm. If you are exercising, it will go up and that is okay.   Follow-Up: At [facility], you and your health needs are our priority.  As part of our continuing mission to provide you with exceptional heart care, we have created designated Provider Care Teams.  These Care Teams include your primary Cardiologist (physician) and Advanced Practice Providers (APPs -  Physician Assistants and Nurse Practitioners) who all work together to provide you with the care you need, when you need it.  We recommend signing up for the patient portal called "MyChart".  Sign up information is provided on this After Visit Summary.  MyChart is used to connect with patients for Virtual Visits (Telemedicine).  Patients are able to view lab/test results, encounter notes, upcoming appointments, etc.  Non-urgent messages can be sent to your provider as well.   To learn more about what you can do with MyChart, go to [email or web address].    Your next appointment:   2-3 month(s)  The format for your next appointment:   In Person  Provider:   [name], MD or Laurann Montana, NP    Other Instructions   Recommend calling Orthopedics to schedule your nerve test for your arm and wrist pain. Their phone number is [phone]  [phone] for Substance Abuse and [place]. This is a good number to keep in your  phone for reference.   Would recommend going to a Narcotics Anonymous support group. There are some fantastic ones, even ones that meet virtually.   Heart Healthy Diet Recommendations: A low-salt diet is recommended. Meats should be grilled, baked, or boiled. Avoid fried foods. Focus on lean protein sources like fish or chicken with vegetables and fruits. The American Heart Association is a Microbiologist!  American Heart Association Diet and Lifeystyle Recommendations   Exercise recommendations: The American Heart Association recommends 150 minutes of moderate intensity exercise weekly. Try 30 minutes of moderate intensity exercise 4-5 times per week. This could include walking, jogging, or swimming.  Managing the Challenge of Quitting Smoking Quitting smoking is a physical and mental challenge. You may have cravings, withdrawal symptoms, and temptation to smoke. Before quitting, work with your health care provider to make a plan that can help you manage quitting. Making a plan before you quit may keep you from smoking when you have the urge to smoke while trying to quit. How to manage lifestyle changes Managing stress Stress can make you want to smoke, and wanting to smoke may cause stress. It is important to find ways to manage your stress. You could try some of the following: Practice relaxation techniques. Breathe slowly and deeply, in through your nose and out through your mouth. Listen to music. Soak in a bath or take a shower. Imagine a peaceful place or vacation. Get some support. Talk with family or friends about your stress. Join a support group. Talk with  a counselor or therapist. Get some physical activity. Go for a walk, run, or bike ride. Play a favorite sport. Practice yoga.  Medicines Talk with your health care provider about medicines that might help you deal with cravings and make quitting easier for you. Relationships Social situations can be difficult when you  are quitting smoking. To manage this, you can: Avoid parties and other social situations where people might be smoking. Avoid alcohol. Leave right away if you have the urge to smoke. Explain to your family and friends that you are quitting smoking. Ask for support and let them know you might be a bit grumpy. Plan activities where smoking is not an option. General instructions Be aware that many people gain weight after they quit smoking. However, not everyone does. To keep from gaining weight, have a plan in place before you quit, and stick to the plan after you quit. Your plan should include: Eating healthy snacks. When you have a craving, it may help to: Eat popcorn, or try carrots, celery, or other cut vegetables. Chew sugar-free gum. Changing how you eat. Eat small portion sizes at meals. Eat 4-6 small meals throughout the day instead of 1-2 large meals a day. Be mindful when you eat. You should avoid watching television or doing other things that might distract you as you eat. Exercising regularly. Make time to exercise each day. If you do not have time for a long workout, do short bouts of exercise for 5-10 minutes several times a day. Do some form of strengthening exercise, such as weight lifting. Do some exercise that gets your heart beating and causes you to breathe deeply, such as walking fast, running, swimming, or biking. This is very important. Drinking plenty of water or other low-calorie or no-calorie drinks. Drink enough fluid to keep your urine pale yellow.  How to recognize withdrawal symptoms Your body and mind may experience discomfort as you try to get used to not having nicotine in your system. These effects are called withdrawal symptoms. They may include: Feeling hungrier than normal. Having trouble concentrating. Feeling irritable or restless. Having trouble sleeping. Feeling depressed. Craving a cigarette. These symptoms may surprise you, but they are normal to  have when quitting smoking. To manage withdrawal symptoms: Avoid places, people, and activities that trigger your cravings. Remember why you want to quit. Get plenty of sleep. Avoid coffee and other drinks that contain caffeine. These may worsen some of your symptoms. How to manage cravings Come up with a plan for how to deal with your cravings. The plan should include the following: A definition of the specific situation you want to deal with. An activity or action you will take to replace smoking. A clear idea for how this action will help. The name of someone who could help you with this. Cravings usually last for 5-10 minutes. Consider taking the following actions to help you with your plan to deal with cravings: Keep your mouth busy. Chew sugar-free gum. Suck on hard candies or a straw. Brush your teeth. Keep your hands and body busy. Change to a different activity right away. Squeeze or play with a ball. Do an activity or a hobby, such as making bead jewelry, practicing needlepoint, or working with wood. Mix up your normal routine. Take a short exercise break. Go for a quick walk, or run up and down stairs. Focus on doing something kind or helpful for someone else. Call a friend or family member to talk during a craving. Join  a support group. Contact a quitline. Where to find support To get help or find a support group: Call the [facility]'s Smoking Quitline: 1-800-QUIT-NOW 312-340-2746) Text QUIT to SmokefreeTXT: 592924 Where to find more information Visit these websites to find more information on quitting smoking: U.S. Department of Health and Human Services: www.smokefree.gov American Lung Association: www.freedomfromsmoking.org Centers for Disease Control and Prevention (CDC): http://www.wolf.info/ American Heart Association: www.heart.org Contact a health care provider if: You want to change your plan for quitting. The medicines you are taking are not  helping. Your eating feels out of control or you cannot sleep. You feel depressed or become very anxious. Summary Quitting smoking is a physical and mental challenge. You will face cravings, withdrawal symptoms, and temptation to smoke again. Preparation can help you as you go through these challenges. Try different techniques to manage stress, handle social situations, and prevent weight gain. You can deal with cravings by keeping your mouth busy (such as by chewing gum), keeping your hands and body busy, calling family or friends, or contacting a quitline for people who want to quit smoking. You can deal with withdrawal symptoms by avoiding places where people smoke, getting plenty of rest, and avoiding drinks that contain caffeine. This information is not intended to replace advice given to you by your health care provider. Make sure you discuss any questions you have with your health care provider. Document Revised: 02/28/2021 Document Reviewed: 02/28/2021 Elsevier Patient Education  [place].

## 2021-10-21 ENCOUNTER — Other Ambulatory Visit: Payer: Self-pay

## 2021-10-30 ENCOUNTER — Other Ambulatory Visit (HOSPITAL_COMMUNITY)
Admission: RE | Admit: 2021-10-30 | Discharge: 2021-10-30 | Disposition: A | Discharge status: Home / Self Care | Payer: Medicaid Other | Source: Ambulatory Visit | Attending: Family Medicine | Admitting: Family Medicine

## 2021-10-30 ENCOUNTER — Encounter: Payer: Self-pay | Admitting: Family Medicine

## 2021-10-30 ENCOUNTER — Ambulatory Visit: Payer: Medicaid Other | Attending: Family Medicine | Admitting: Family Medicine

## 2021-10-30 ENCOUNTER — Other Ambulatory Visit: Payer: Self-pay

## 2021-10-30 VITALS — BP 139/79 | HR 94 | Temp 97.8°F | Ht 66.0 in | Wt 189.0 lb

## 2021-10-30 DIAGNOSIS — Z1231 Encounter for screening mammogram for malignant neoplasm of breast: Secondary | ICD-10-CM

## 2021-10-30 DIAGNOSIS — F419 Anxiety disorder, unspecified: Secondary | ICD-10-CM

## 2021-10-30 DIAGNOSIS — Z1211 Encounter for screening for malignant neoplasm of colon: Secondary | ICD-10-CM

## 2021-10-30 DIAGNOSIS — Z0000 Encounter for general adult medical examination without abnormal findings: Secondary | ICD-10-CM

## 2021-10-30 DIAGNOSIS — Z124 Encounter for screening for malignant neoplasm of cervix: Secondary | ICD-10-CM | POA: Diagnosis not present

## 2021-10-30 MED ORDER — HYDROXYZINE HCL 50 MG PO TABS
50.0000 mg | ORAL_TABLET | Freq: Three times a day (TID) | ORAL | 3 refills | Status: DC | PRN
  Filled 2021-10-30: qty 90, 30d supply, fill #0
  Filled 2021-12-29: qty 90, 30d supply, fill #1
  Filled 2022-08-20: qty 90, 30d supply, fill #2
  Filled 2022-10-26: qty 90, 30d supply, fill #3

## 2021-10-30 NOTE — Patient Instructions (Signed)

## 2021-10-30 NOTE — Progress Notes (Signed)
Subjective:  Patient ID: Morgan Bean, female    DOB: [birth year]  Age: 56 y.o. MRN: [id number]  CC: Annual Exam and Gynecologic Exam   HPI Morgan Bean is a 56 y.o. year old female with a history of Hypertension, paroxysmal Afib, A-flutter (status post ablation) CAD, previous CVA, moderate to severe MR,Depression, polysubstance abuse.  Interval History: She presents today for complete physical exam and is due for breast cancer, colorectal cancer and cervical cancer screenings. Does not exercise regularly and does not incorporate adequate intake of fruits and vegetables to her diet.  She goes to see the dentist regularly. She is requesting refill of her hydroxyzine for anxiety.  Past Medical History:  Diagnosis Date   Anemia of other chronic disease 11/13/2013   Anginal pain ([place])    none in past year   Anxiety    Asthma    patient denies   Candidiasis 06/23/2013   Chronic diastolic CHF (congestive heart failure) ([name])    Constipation    Coronary artery disease    Lexiscan myoview (10/12) with significant ST depression upon Lexiscan injection but no ischemia or infarction on perfusion images. Left heart cath (10/12): 30% mid LAD, 30% ostial D1, 50% ostial D2.     Cough 11/13/2013   Depression    Headache 02/22/2014   Hx of cardiovascular stress test    Lexiscan Myoview (2/16): Breast attenuation artifact, no ischemia, EF 64%; low risk   Hyperlipemia    Hypertension    Iron deficiency    hx of   Leukocytoclastic vasculitis ([name])    Rash across lower body, occurred in 9/11, diagnosed by skin biopsy. ANA positive. Thought to be secondary to cocaine use.   Leukopenia 03/21/2013   Lymphadenopathy 03/21/2013   Lymphadenopathy 01/30/2014   Mental disorder    Pancytopenia ([place])    Pancytopenia, acquired ([place]) 07/16/2015   Polysubstance abuse ([place])    Prior cocaine   Pulmonary HTN ([place])    Echo (9/11) with EF 65%, mild LVH, mild AI, mild MR, moderate to possibly severe TR with PA  systolic pressure 52 mmHg. Echo (10/12): severe LV hypertrophy, EF 60-65%, mild MR, mild AI, moderate to severe tricuspid regurgitation, PA systolic pressure 38 mmHg.  Echo 4/14: moderate LVH, EF 65%, normal wall motion, diastolic dysfunction, mild AI, mild MR   Shortness of breath    a. PFTs 5/14:  FEV1/FVC 99% predicted; FVC 60% predicted; DLCO mildly reduced, mild restriction, air trapping.;  b.  seen by pulmo (Dr. [name]) 5/14: mainly upper airway symptoms - ACE d/c'd (sx's better)   Stroke [facility]) 2010ish   Syphilis 04/25/2013   Tobacco abuse    Tricuspid regurgitation    Unspecified deficiency anemia 03/29/2013    Past Surgical History:  Procedure Laterality Date   ANKLE SURGERY     right   BONE MARROW ASPIRATION     CARDIAC CATHETERIZATION     CARDIAC CATHETERIZATION N/A 04/23/2015   Procedure: Right Heart Cath;  Surgeon: [name], MD;  Location: [facility];  Service: Cardiovascular;  Laterality: N/A;   CARDIOVERSION N/A 03/11/2018   Procedure: CARDIOVERSION;  Surgeon: [name], MD;  Location: [facility] ENDOSCOPY;  Service: Cardiovascular;  Laterality: N/A;   I & D EXTREMITY  08/09/2011   Procedure: IRRIGATION AND DEBRIDEMENT EXTREMITY;  Surgeon: [name], MD;  Location: [facility];  Service: General;  Laterality: Left;  i & D Left axilla abscess   LYMPH NODE BIOPSY N/A 04/05/2013  Procedure: EXCISIONAL BIOPSY RIGHT SUBMANDIBULAR NODE, NASAL ENDOSCOPIC WITH BIOPSY NASAL PHARYNX;  Surgeon: [name], MD;  Location: [facility];  Service: ENT;  Laterality: N/A;   MULTIPLE EXTRACTIONS WITH ALVEOLOPLASTY N/A 03/03/2019   Procedure: MULTIPLE EXTRACTION WITH ALVEOLOPLASTY;  Surgeon: [name], DDS;  Location: [facility];  Service: Oral Surgery;  Laterality: N/A;   TEE WITHOUT CARDIOVERSION N/A 03/11/2018   Procedure: TRANSESOPHAGEAL ECHOCARDIOGRAM (TEE);  Surgeon: [name], MD;  Location: [facility];  Service: Cardiovascular;  Laterality: N/A;   TEE WITHOUT CARDIOVERSION N/A  12/27/2020   Procedure: TRANSESOPHAGEAL ECHOCARDIOGRAM (TEE);  Surgeon: [name], DO;  Location: [facility] ENDOSCOPY;  Service: Cardiovascular;  Laterality: N/A;    Family History  Problem Relation Age of Onset   Coronary artery disease Mother    Diabetes Mother    Diabetes Father    Hypertension Father    Coronary artery disease Father    Heart attack Father    Breast cancer Maternal Grandmother    Diabetes Maternal Grandmother    Heart attack Maternal Grandmother    Stroke Maternal Grandmother    Asthma Grandson     Social History   Socioeconomic History   Marital status: Single    Spouse name: Not on file   Number of children: 2   Years of education: Not on file   Highest education level: GED or equivalent  Occupational History   Occupation: unemployed  Tobacco Use   Smoking status: Every Day    Packs/day: 0.50    Years: 39.00    Total pack years: 19.50    Types: Cigarettes   Smokeless tobacco: Never  Vaping Use   Vaping Use: Never used  Substance and Sexual Activity   Alcohol use: Not Currently    Comment: used in the past   Drug use: Yes    Frequency: 7.0 times per week    Types: "Crack" cocaine    Comment: last use Monday 12/23/2020   Sexual activity: Not Currently  Other Topics Concern   Not on file  Social History Narrative   Lives with mom, sisters and brothers.   Social Determinants of Health   Financial Resource Strain: Medium Risk (12/25/2020)   Overall Financial Resource Strain (CARDIA)    Difficulty of Paying Living Expenses: Somewhat hard  Food Insecurity: No Food Insecurity (12/25/2020)   Hunger Vital Sign    Worried About Running Out of Food in the Last Year: Never true    Ran Out of Food in the Last Year: Never true  Transportation Needs: Unmet Transportation Needs (12/25/2020)   PRAPARE - Hydrologist (Medical): Yes    Lack of Transportation (Non-Medical): Yes  Physical Activity: Not on file  Stress: Not on file   Social Connections: Not on file    Allergies  Allergen Reactions   Ciprofloxacin Itching    Outpatient Medications Prior to Visit  Medication Sig Dispense Refill   albuterol (VENTOLIN HFA) 108 (90 Base) MCG/ACT inhaler Inhale 2 puffs into the lungs every 6 (six) hours as needed for wheezing or shortness of breath. 18 g 2   atorvastatin (LIPITOR) 40 MG tablet Take 1 tablet (40 mg total) by mouth at bedtime. 30 tablet 11   diltiazem (CARDIZEM CD) 360 MG 24 hr capsule Take 1 capsule (360 mg total) by mouth daily. 90 capsule 1   fluticasone (FLONASE) 50 MCG/ACT nasal spray Administer 2 sprays in each nostril daily. 16 g 11   furosemide (LASIX) 40 MG  tablet Take 1 tablet (40 mg total) by mouth daily. 90 tablet 1   lisinopril (ZESTRIL) 10 MG tablet Take 1 tablet (10 mg total) by mouth daily. 30 tablet 5   omeprazole (PRILOSEC) 40 MG capsule Take 1 capsule (40 mg total) by mouth daily. Take with water, wait 30-60 minutes after taking before eating or drinking anything but water 30 capsule 2   potassium chloride SA (KLOR-CON M) 20 MEQ tablet Take 1 tablet (20 mEq total) by mouth daily. 30 tablet 5   rivaroxaban (XARELTO) 20 MG TABS tablet Take 1 tablet (20 mg total) by mouth daily with supper. 30 tablet 5   spironolactone (ALDACTONE) 25 MG tablet Take 1 tablet (25 mg total) by mouth once daily. 90 tablet 1   hydrOXYzine (ATARAX) 50 MG tablet Take 1 tablet (50 mg total) by mouth 3 (three) times daily. 30 tablet 1   No facility-administered medications prior to visit.     ROS Review of Systems  Constitutional:  Negative for activity change, appetite change and fatigue.  HENT:  Negative for congestion, sinus pressure and sore throat.   Eyes:  Negative for visual disturbance.  Respiratory:  Negative for cough, chest tightness, shortness of breath and wheezing.   Cardiovascular:  Negative for chest pain and palpitations.  Gastrointestinal:  Negative for abdominal distention, abdominal pain and  constipation.  Endocrine: Negative for polydipsia.  Genitourinary:  Negative for dysuria and frequency.  Musculoskeletal:  Negative for arthralgias and back pain.  Skin:  Negative for rash.  Neurological:  Negative for tremors, light-headedness and numbness.  Hematological:  Does not bruise/bleed easily.  Psychiatric/Behavioral:  Negative for agitation and behavioral problems.     Objective:  BP 139/79   Pulse 94   Temp 97.8 F (36.6 C) (Oral)   Ht '5\' 6"'$  (1.676 m)   Wt 189 lb (85.7 kg)   LMP 04/24/2013 Comment: states periods come and go  SpO2 97%   BMI 30.51 kg/m      10/30/2021   10:33 AM 10/14/2021   10:44 AM 07/22/2021    9:22 AM  BP/Weight  Systolic BP 601 093 235  Diastolic BP 79 92 90  Wt. (Lbs) 189 188 183.2  BMI 30.51 kg/m2 30.34 kg/m2 29.57 kg/m2      Physical Exam Exam conducted with a chaperone present.  Constitutional:      General: She is not in acute distress.    Appearance: She is well-developed. She is not diaphoretic.  HENT:     Head: Normocephalic.     Right Ear: External ear normal.     Left Ear: External ear normal.     Nose: Nose normal.  Eyes:     Conjunctiva/sclera: Conjunctivae normal.     Pupils: Pupils are equal, round, and reactive to light.  Neck:     Vascular: No JVD.  Cardiovascular:     Rate and Rhythm: Normal rate and regular rhythm.     Heart sounds: Normal heart sounds. No murmur heard.    No gallop.  Pulmonary:     Effort: Pulmonary effort is normal. No respiratory distress.     Breath sounds: Normal breath sounds. No wheezing or rales.  Chest:     Chest wall: No tenderness.  Breasts:    Right: Normal. No mass, nipple discharge or tenderness.     Left: Normal. No mass, nipple discharge or tenderness.  Abdominal:     General: Bowel sounds are normal. There is no distension.  Palpations: Abdomen is soft. There is no mass.     Tenderness: There is no abdominal tenderness.     Hernia: There is no hernia in the left  inguinal area or right inguinal area.  Genitourinary:    General: Normal vulva.     Pubic Area: No rash.      Labia:        Right: No rash.        Left: No rash.      Vagina: Normal.     Cervix: Normal.     Uterus: Normal.      Adnexa: Right adnexa normal and left adnexa normal.       Right: No tenderness.         Left: No tenderness.    Musculoskeletal:        General: No tenderness. Normal range of motion.     Cervical back: Normal range of motion. No tenderness.  Lymphadenopathy:     Upper Body:     Right upper body: No supraclavicular or axillary adenopathy.     Left upper body: No supraclavicular or axillary adenopathy.  Skin:    General: Skin is warm and dry.  Neurological:     Mental Status: She is alert and oriented to person, place, and time.     Deep Tendon Reflexes: Reflexes are normal and symmetric.        Latest Ref Rng & Units 05/08/2021   11:20 AM 04/17/2021    4:28 AM 04/16/2021    5:07 AM  CMP  Glucose 70 - 99 mg/dL 105  94  90   BUN 6 - 24 mg/dL '29  17  19   '$ Creatinine 0.57 - 1.00 mg/dL 1.43  1.30  1.34   Sodium 134 - 144 mmol/L 136  138  136   Potassium 3.5 - 5.2 mmol/L 4.3  4.1  4.0   Chloride 96 - 106 mmol/L 101  108  106   CO2 20 - 29 mmol/L '22  21  20   '$ Calcium 8.7 - 10.2 mg/dL 9.7  9.0  8.6   Total Protein 6.0 - 8.5 g/dL 8.1  6.8  6.4   Total Bilirubin 0.0 - 1.2 mg/dL 0.2  0.7  0.8   Alkaline Phos 44 - 121 IU/L 108  68  57   AST 0 - 40 IU/L 17  38  39   ALT 0 - 32 IU/L 12  35  33     Lipid Panel     Component Value Date/Time   CHOL 152 12/26/2020 0232   TRIG 51 12/26/2020 0232   HDL 48 12/26/2020 0232   CHOLHDL 3.2 12/26/2020 0232   VLDL 10 12/26/2020 0232   LDLCALC 94 12/26/2020 0232   LDLDIRECT 142.1 12/25/2010 0846    CBC    Component Value Date/Time   WBC 6.2 05/08/2021 1120   WBC 3.3 (L) 04/17/2021 0428   RBC 4.75 05/08/2021 1120   RBC 4.87 04/17/2021 0428   HGB 12.7 05/08/2021 1120   HGB 10.0 (L) 07/16/2015 1407   HCT  39.4 05/08/2021 1120   HCT 31.8 (L) 07/16/2015 1407   PLT 217 05/08/2021 1120   MCV 83 05/08/2021 1120   MCV 78.2 (L) 07/16/2015 1407   MCH 26.7 05/08/2021 1120   MCH 27.3 04/17/2021 0428   MCHC 32.2 05/08/2021 1120   MCHC 32.6 04/17/2021 0428   RDW 13.8 05/08/2021 1120   RDW 14.9 (H) 07/16/2015 1407   LYMPHSABS 2.5  05/08/2021 1120   LYMPHSABS 1.8 07/16/2015 1407   MONOABS 0.4 04/17/2021 0428   MONOABS 0.8 07/16/2015 1407   EOSABS 0.3 05/08/2021 1120   BASOSABS 0.1 05/08/2021 1120   BASOSABS 0.0 07/16/2015 1407    Lab Results  Component Value Date   HGBA1C 5.2 12/26/2020    Assessment & Plan:  1. Annual physical exam Counseled on 150 minutes of exercise per week, healthy eating (including decreased daily intake of saturated fats, cholesterol, added sugars, sodium), routine healthcare maintenance. - Basic Metabolic Panel  2. Screening for cervical cancer - Cytology - PAP  3. Encounter for screening mammogram for malignant neoplasm of breast - MM DIGITAL SCREENING BILATERAL; Future  4. Screening for colon cancer - Ambulatory referral to Gastroenterology  5. Anxiety - hydrOXYzine (ATARAX) 50 MG tablet; Take 1 tablet (50 mg total) by mouth every 8 (eight) hours as needed.  Dispense: 90 tablet; Refill: 3    Meds ordered this encounter  Medications   hydrOXYzine (ATARAX) 50 MG tablet    Sig: Take 1 tablet (50 mg total) by mouth every 8 (eight) hours as needed.    Dispense:  90 tablet    Refill:  3    Follow-up: Return in about 6 months (around 05/02/2022) for Chronic medical conditions.       [name], MD, FAAFP. [facility] [place], [place]   10/30/2021, 12:01 PM

## 2021-11-03 ENCOUNTER — Telehealth: Payer: Self-pay | Admitting: Orthopedic Surgery

## 2021-11-03 DIAGNOSIS — R200 Anesthesia of skin: Secondary | ICD-10-CM

## 2021-11-03 LAB — CYTOLOGY - PAP
Comment: NEGATIVE
Diagnosis: NEGATIVE
High risk HPV: NEGATIVE

## 2021-11-03 NOTE — Telephone Encounter (Signed)
Patient called. She would like a referral to see Dr. Ernestina Patches for a nerve test. Her call back number is [phone]

## 2021-11-04 NOTE — Addendum Note (Signed)
Addended by: Melton Alar on: 11/04/2021 10:15 AM   Modules accepted: Orders

## 2021-11-04 NOTE — Telephone Encounter (Signed)
Referral has been placed. 

## 2021-11-04 NOTE — Telephone Encounter (Signed)
Are you okay with referral being placed.

## 2021-11-05 ENCOUNTER — Other Ambulatory Visit: Payer: Self-pay

## 2021-11-06 ENCOUNTER — Ambulatory Visit: Payer: Medicaid Other | Attending: Family Medicine

## 2021-11-07 ENCOUNTER — Other Ambulatory Visit: Payer: Self-pay | Admitting: Family Medicine

## 2021-11-07 DIAGNOSIS — N1832 Chronic kidney disease, stage 3b: Secondary | ICD-10-CM

## 2021-11-07 LAB — BASIC METABOLIC PANEL
BUN/Creatinine Ratio: 15 (ref 9–23)
BUN: 30 mg/dL — ABNORMAL HIGH (ref 6–24)
CO2: 22 mmol/L (ref 20–29)
Calcium: 9.5 mg/dL (ref 8.7–10.2)
Chloride: 103 mmol/L (ref 96–106)
Creatinine, Ser: 2.01 mg/dL — ABNORMAL HIGH (ref 0.57–1.00)
Glucose: 85 mg/dL (ref 70–99)
Potassium: 4.7 mmol/L (ref 3.5–5.2)
Sodium: 138 mmol/L (ref 134–144)
eGFR: 28 mL/min/{1.73_m2} — ABNORMAL LOW (ref >59)

## 2021-11-10 ENCOUNTER — Other Ambulatory Visit (HOSPITAL_BASED_OUTPATIENT_CLINIC_OR_DEPARTMENT_OTHER): Payer: Self-pay | Admitting: Family

## 2021-11-10 ENCOUNTER — Other Ambulatory Visit: Payer: Self-pay

## 2021-11-10 DIAGNOSIS — E876 Hypokalemia: Secondary | ICD-10-CM

## 2021-11-10 DIAGNOSIS — I4819 Other persistent atrial fibrillation: Secondary | ICD-10-CM

## 2021-11-10 MED ORDER — POTASSIUM CHLORIDE CRYS ER 20 MEQ PO TBCR
20.0000 meq | EXTENDED_RELEASE_TABLET | Freq: Every day | ORAL | 2 refills | Status: DC
  Filled 2021-11-10: qty 90, 90d supply, fill #0
  Filled 2022-03-19 – 2022-04-01 (×2): qty 90, 90d supply, fill #1
  Filled 2022-08-20: qty 90, 90d supply, fill #2

## 2021-11-10 NOTE — Telephone Encounter (Signed)
Rx(s) sent to pharmacy electronically.  

## 2021-11-12 ENCOUNTER — Other Ambulatory Visit: Payer: Self-pay

## 2021-11-18 ENCOUNTER — Ambulatory Visit
Admission: RE | Admit: 2021-11-18 | Discharge: 2021-11-18 | Disposition: A | Discharge status: Home / Self Care | Payer: Medicaid Other | Source: Ambulatory Visit | Attending: Family Medicine | Admitting: Family Medicine

## 2021-11-18 DIAGNOSIS — Z1231 Encounter for screening mammogram for malignant neoplasm of breast: Secondary | ICD-10-CM

## 2021-11-25 ENCOUNTER — Ambulatory Visit (INDEPENDENT_AMBULATORY_CARE_PROVIDER_SITE_OTHER): Payer: Medicaid Other | Admitting: Physical Medicine and Rehabilitation

## 2021-11-25 ENCOUNTER — Encounter: Payer: Self-pay | Admitting: Family Medicine

## 2021-11-25 ENCOUNTER — Encounter: Payer: Self-pay | Admitting: Physical Medicine and Rehabilitation

## 2021-11-25 DIAGNOSIS — M79641 Pain in right hand: Secondary | ICD-10-CM

## 2021-11-25 DIAGNOSIS — M79642 Pain in left hand: Secondary | ICD-10-CM

## 2021-11-25 DIAGNOSIS — R202 Paresthesia of skin: Secondary | ICD-10-CM

## 2021-11-25 DIAGNOSIS — M47812 Spondylosis without myelopathy or radiculopathy, cervical region: Secondary | ICD-10-CM

## 2021-11-25 DIAGNOSIS — M542 Cervicalgia: Secondary | ICD-10-CM | POA: Diagnosis not present

## 2021-11-25 NOTE — Progress Notes (Unsigned)
Pt state tingling and pain in both hands. Pt state she has tingling in her thumb, pointer and middle finger the travel up her arms. Pt state is hard for her to makes a fist at times. Pt state she has been dropping items. Pt state she wears a brace at night to help with the pain. Pt state she right handed.  Numeric Pain Rating Scale and Functional Assessment Average Pain 8   In the last MONTH (on 0-10 scale) has pain interfered with the following?  1. General activity like being  able to carry out your everyday physical activities such as walking, climbing stairs, carrying groceries, or moving a chair?  Rating(10)   +BT,

## 2021-11-26 NOTE — Procedures (Signed)
EMG & NCV Findings: Evaluation of the left median motor nerve showed prolonged distal onset latency (4.4 ms) and decreased conduction velocity (Elbow-Wrist, 49 m/s).  The right median motor nerve showed prolonged distal onset latency (5.9 ms), reduced amplitude (3.8 mV), and decreased conduction velocity (Elbow-Wrist, 44 m/s).  The left median (across palm) sensory nerve showed prolonged distal peak latency (Wrist, 4.3 ms).  The right median (across palm) sensory nerve showed no response (Palm) and prolonged distal peak latency (4.9 ms).  The left ulnar sensory nerve showed prolonged distal peak latency (3.8 ms) and decreased conduction velocity (Wrist-5th Digit, 37 m/s).  All remaining nerves (as indicated in the following tables) were within normal limits.  Left vs. Right side comparison data for the median motor nerve indicates abnormal L-R latency difference (1.5 ms) and abnormal L-R amplitude difference (68.6 %).  The ulnar motor nerve indicates abnormal L-R amplitude difference (25.3 %).  All remaining left vs. right side differences were within normal limits.    All examined muscles (as indicated in the following table) showed no evidence of electrical instability.    Impression: The above electrodiagnostic study is ABNORMAL and reveals evidence of:  a severe right median nerve entrapment at the wrist (carpal tunnel syndrome) affecting sensory and motor components.   a moderate left median nerve entrapment at the wrist (carpal tunnel syndrome) affecting sensory and motor components.   There is no significant electrodiagnostic evidence of any other focal nerve entrapment, brachial plexopathy or cervical radiculopathy.   Recommendations: 1.  Follow-up with referring physician. 2.  Continue current management of symptoms. 3.  Continue use of resting splint at night-time and as needed during the day. 4.  Suggest surgical evaluation.  ___________________________ Laurence Spates FAAPMR Board  Certified, American Board of Physical Medicine and Rehabilitation    Nerve Conduction Studies Anti Sensory Summary Table   Stim Site NR Peak (ms) Norm Peak (ms) P-T Amp (V) Norm P-T Amp Site1 Site2 Delta-P (ms) Dist (cm) Vel (m/s) Norm Vel (m/s)  Left Median Acr Palm Anti Sensory (2nd Digit)  30.2C  Wrist    *4.3 <3.6 39.1 >10 Wrist Palm 3.0 0.0    Palm    1.3 <2.0 1.1         Right Median Acr Palm Anti Sensory (2nd Digit)  30C  Wrist    *4.9 <3.6 15.9 >10 Wrist Palm  0.0    Palm *NR  <2.0          Left Radial Anti Sensory (Base 1st Digit)  30.2C  Wrist    2.3 <3.1 26.5  Wrist Base 1st Digit 2.3 0.0    Right Radial Anti Sensory (Base 1st Digit)  30C  Wrist    2.5 <3.1 32.9  Wrist Base 1st Digit 2.5 0.0    Left Ulnar Anti Sensory (5th Digit)  30.4C  Wrist    *3.8 <3.7 18.3 >15.0 Wrist 5th Digit 3.8 14.0 *37 >38  Right Ulnar Anti Sensory (5th Digit)  30.2C  Wrist    3.7 <3.7 19.2 >15.0 Wrist 5th Digit 3.7 14.0 38 >38   Motor Summary Table   Stim Site NR Onset (ms) Norm Onset (ms) O-P Amp (mV) Norm O-P Amp Site1 Site2 Delta-0 (ms) Dist (cm) Vel (m/s) Norm Vel (m/s)  Left Median Motor (Abd Poll Brev)  30.3C  Wrist    *4.4 <4.2 12.1 >5 Elbow Wrist 4.4 21.5 *49 >50  Elbow    8.8  11.7  Right Median Motor (Abd Poll Brev)  30C  Wrist    *5.9 <4.2 *3.8 >5 Elbow Wrist 4.7 20.5 *44 >50  Elbow    10.6  3.7         Left Ulnar Motor (Abd Dig Min)  30.4C  Wrist    3.2 <4.2 5.9 >3 B Elbow Wrist 3.3 21.0 64 >53  B Elbow    6.5  5.2  A Elbow B Elbow 1.9 10.0 53 >53  A Elbow    8.4  4.9         Right Ulnar Motor (Abd Dig Min)  30.1C  Wrist    3.0 <4.2 7.9 >3 B Elbow Wrist 3.8 21.0 55 >53  B Elbow    6.8  7.4  A Elbow B Elbow 1.6 10.0 62 >53  A Elbow    8.4  7.4          EMG   Side Muscle Nerve Root Ins Act Fibs Psw Amp Dur Poly Recrt Int Fraser Din Comment  Right Abd Poll Brev Median C8-T1 Nml Nml Nml Nml Nml 0 Nml Nml   Right 1stDorInt Ulnar C8-T1 Nml Nml Nml Nml Nml 0 Nml Nml    Right PronatorTeres Median C6-7 Nml Nml Nml Nml Nml 0 Nml Nml   Right Biceps Musculocut C5-6 Nml Nml Nml Nml Nml 0 Nml Nml   Right Deltoid Axillary C5-6 Nml Nml Nml Nml Nml 0 Nml Nml   Left Abd Poll Brev Median C8-T1 Nml Nml Nml Nml Nml 0 Nml Nml   Left 1stDorInt Ulnar C8-T1 Nml Nml Nml Nml Nml 0 Nml Nml   Left PronatorTeres Median C6-7 Nml Nml Nml Nml Nml 0 Nml Nml   Left Biceps Musculocut C5-6 Nml Nml Nml Nml Nml 0 Nml Nml   Left Deltoid Axillary C5-6 Nml Nml Nml Nml Nml 0 Nml Nml     Nerve Conduction Studies Anti Sensory Left/Right Comparison   Stim Site L Lat (ms) R Lat (ms) L-R Lat (ms) L Amp (V) R Amp (V) L-R Amp (%) Site1 Site2 L Vel (m/s) R Vel (m/s) L-R Vel (m/s)  Median Acr Palm Anti Sensory (2nd Digit)  30.2C  Wrist *4.3 *4.9 0.6 39.1 15.9 59.3 Wrist Palm     Palm 1.3   1.1         Radial Anti Sensory (Base 1st Digit)  30.2C  Wrist 2.3 2.5 0.2 26.5 32.9 19.5 Wrist Base 1st Digit     Ulnar Anti Sensory (5th Digit)  30.4C  Wrist *3.8 3.7 0.1 18.3 19.2 4.7 Wrist 5th Digit *37 38 1   Motor Left/Right Comparison   Stim Site L Lat (ms) R Lat (ms) L-R Lat (ms) L Amp (mV) R Amp (mV) L-R Amp (%) Site1 Site2 L Vel (m/s) R Vel (m/s) L-R Vel (m/s)  Median Motor (Abd Poll Brev)  30.3C  Wrist *4.4 *5.9 *1.5 12.1 *3.8 *68.6 Elbow Wrist *49 *44 5  Elbow 8.8 10.6 1.8 11.7 3.7 68.4       Ulnar Motor (Abd Dig Min)  30.4C  Wrist 3.2 3.0 0.2 5.9 7.9 *25.3 B Elbow Wrist 64 55 9  B Elbow 6.5 6.8 0.3 5.2 7.4 29.7 A Elbow B Elbow 53 62 9  A Elbow 8.4 8.4 0.0 4.9 7.4 33.8          Waveforms:

## 2021-11-26 NOTE — Progress Notes (Signed)
Morgan Bean - 56 y.o. female MRN [id number]  Date of birth: August 27, 1965  Office Visit Note: Visit Date: 11/25/2021 PCP: [name], MD Referred by: Sherilyn Cooter, MD  Subjective: Chief Complaint  Patient presents with   Right Hand - Pain, Tingling   Left Hand - Pain, Tingling   HPI: Morgan Bean is a 56 y.o. female who comes in today for evaluation and management at the request of Dr. Sherilyn Cooter for chronic worsening severe bilateral hand pain with numbness tingling and swelling and weakness.  She also reports neck pain and shoulder pain without frank radiculopathy.  She began having symptoms in the hands sometime in April.  She saw Dr. Tempie Donning in May with continued complaints.  She has tried anti-inflammatories and some bracing without relief.  She admits to weakness and dropping objects.  She does get some nocturnal complaints.  Again neck pain in general history of lymphadenopathy for many years with prior MRI of the cervical spine many years ago showing C5-6 pathology but no central stenosis.  Case is medically complicated by quite a complicated medical history including COPD, stage III CKD, polysubstance abuse, prior CVA, CHF, pulmonary hypertension, and CAD.      Review of Systems  Constitutional:  Positive for malaise/fatigue. Negative for fever and weight loss.  Musculoskeletal:  Positive for joint pain and neck pain.  Neurological:  Positive for tingling and weakness.  All other systems reviewed and are negative.  Otherwise per HPI.  Assessment & Plan: Visit Diagnoses:    ICD-10-CM   1. Paresthesia of skin  R20.2 NCV with EMG (electromyography)    2. Cervicalgia  M54.2     3. Bilateral hand pain  M79.641    M79.642     4. Cervical spondylosis without myelopathy  M47.812        Plan: Impression: The above electrodiagnostic study is ABNORMAL and reveals evidence of:  a severe right median nerve entrapment at the wrist (carpal tunnel syndrome)  affecting sensory and motor components.   a moderate left median nerve entrapment at the wrist (carpal tunnel syndrome) affecting sensory and motor components.   There is no significant electrodiagnostic evidence of any other focal nerve entrapment, brachial plexopathy or cervical radiculopathy.   Recommendations: 1.  Follow-up with referring physician. 2.  Continue current management of symptoms. 3.  Continue use of resting splint at night-time and as needed during the day. 4.  Suggest surgical evaluation.  Meds & Orders: No orders of the defined types were placed in this encounter.   Orders Placed This Encounter  Procedures   NCV with EMG (electromyography)    Follow-up: Return in about 2 weeks (around 12/09/2021) for Sherilyn Cooter, MD.   Procedures: No procedures performed  EMG & NCV Findings: Evaluation of the left median motor nerve showed prolonged distal onset latency (4.4 ms) and decreased conduction velocity (Elbow-Wrist, 49 m/s).  The right median motor nerve showed prolonged distal onset latency (5.9 ms), reduced amplitude (3.8 mV), and decreased conduction velocity (Elbow-Wrist, 44 m/s).  The left median (across palm) sensory nerve showed prolonged distal peak latency (Wrist, 4.3 ms).  The right median (across palm) sensory nerve showed no response (Palm) and prolonged distal peak latency (4.9 ms).  The left ulnar sensory nerve showed prolonged distal peak latency (3.8 ms) and decreased conduction velocity (Wrist-5th Digit, 37 m/s).  All remaining nerves (as indicated in the following tables) were within normal limits.  Left vs. Right side comparison data for the median motor  nerve indicates abnormal L-R latency difference (1.5 ms) and abnormal L-R amplitude difference (68.6 %).  The ulnar motor nerve indicates abnormal L-R amplitude difference (25.3 %).  All remaining left vs. right side differences were within normal limits.    All examined muscles (as indicated in the  following table) showed no evidence of electrical instability.    Impression: The above electrodiagnostic study is ABNORMAL and reveals evidence of:  a severe right median nerve entrapment at the wrist (carpal tunnel syndrome) affecting sensory and motor components.   a moderate left median nerve entrapment at the wrist (carpal tunnel syndrome) affecting sensory and motor components.   There is no significant electrodiagnostic evidence of any other focal nerve entrapment, brachial plexopathy or cervical radiculopathy.   Recommendations: 1.  Follow-up with referring physician. 2.  Continue current management of symptoms. 3.  Continue use of resting splint at night-time and as needed during the day. 4.  Suggest surgical evaluation.  ___________________________ Laurence Spates FAAPMR Board Certified, American Board of Physical Medicine and Rehabilitation    Nerve Conduction Studies Anti Sensory Summary Table   Stim Site NR Peak (ms) Norm Peak (ms) P-T Amp (V) Norm P-T Amp Site1 Site2 Delta-P (ms) Dist (cm) Vel (m/s) Norm Vel (m/s)  Left Median Acr Palm Anti Sensory (2nd Digit)  30.2C  Wrist    *4.3 <3.6 39.1 >10 Wrist Palm 3.0 0.0    Palm    1.3 <2.0 1.1         Right Median Acr Palm Anti Sensory (2nd Digit)  30C  Wrist    *4.9 <3.6 15.9 >10 Wrist Palm  0.0    Palm *NR  <2.0          Left Radial Anti Sensory (Base 1st Digit)  30.2C  Wrist    2.3 <3.1 26.5  Wrist Base 1st Digit 2.3 0.0    Right Radial Anti Sensory (Base 1st Digit)  30C  Wrist    2.5 <3.1 32.9  Wrist Base 1st Digit 2.5 0.0    Left Ulnar Anti Sensory (5th Digit)  30.4C  Wrist    *3.8 <3.7 18.3 >15.0 Wrist 5th Digit 3.8 14.0 *37 >38  Right Ulnar Anti Sensory (5th Digit)  30.2C  Wrist    3.7 <3.7 19.2 >15.0 Wrist 5th Digit 3.7 14.0 38 >38   Motor Summary Table   Stim Site NR Onset (ms) Norm Onset (ms) O-P Amp (mV) Norm O-P Amp Site1 Site2 Delta-0 (ms) Dist (cm) Vel (m/s) Norm Vel (m/s)  Left Median Motor (Abd  Poll Brev)  30.3C  Wrist    *4.4 <4.2 12.1 >5 Elbow Wrist 4.4 21.5 *49 >50  Elbow    8.8  11.7         Right Median Motor (Abd Poll Brev)  30C  Wrist    *5.9 <4.2 *3.8 >5 Elbow Wrist 4.7 20.5 *44 >50  Elbow    10.6  3.7         Left Ulnar Motor (Abd Dig Min)  30.4C  Wrist    3.2 <4.2 5.9 >3 B Elbow Wrist 3.3 21.0 64 >53  B Elbow    6.5  5.2  A Elbow B Elbow 1.9 10.0 53 >53  A Elbow    8.4  4.9         Right Ulnar Motor (Abd Dig Min)  30.1C  Wrist    3.0 <4.2 7.9 >3 B Elbow Wrist 3.8 21.0 55 >53  B Elbow  6.8  7.4  A Elbow B Elbow 1.6 10.0 62 >53  A Elbow    8.4  7.4          EMG   Side Muscle Nerve Root Ins Act Fibs Psw Amp Dur Poly Recrt Int Fraser Din Comment  Right Abd Poll Brev Median C8-T1 Nml Nml Nml Nml Nml 0 Nml Nml   Right 1stDorInt Ulnar C8-T1 Nml Nml Nml Nml Nml 0 Nml Nml   Right PronatorTeres Median C6-7 Nml Nml Nml Nml Nml 0 Nml Nml   Right Biceps Musculocut C5-6 Nml Nml Nml Nml Nml 0 Nml Nml   Right Deltoid Axillary C5-6 Nml Nml Nml Nml Nml 0 Nml Nml   Left Abd Poll Brev Median C8-T1 Nml Nml Nml Nml Nml 0 Nml Nml   Left 1stDorInt Ulnar C8-T1 Nml Nml Nml Nml Nml 0 Nml Nml   Left PronatorTeres Median C6-7 Nml Nml Nml Nml Nml 0 Nml Nml   Left Biceps Musculocut C5-6 Nml Nml Nml Nml Nml 0 Nml Nml   Left Deltoid Axillary C5-6 Nml Nml Nml Nml Nml 0 Nml Nml     Nerve Conduction Studies Anti Sensory Left/Right Comparison   Stim Site L Lat (ms) R Lat (ms) L-R Lat (ms) L Amp (V) R Amp (V) L-R Amp (%) Site1 Site2 L Vel (m/s) R Vel (m/s) L-R Vel (m/s)  Median Acr Palm Anti Sensory (2nd Digit)  30.2C  Wrist *4.3 *4.9 0.6 39.1 15.9 59.3 Wrist Palm     Palm 1.3   1.1         Radial Anti Sensory (Base 1st Digit)  30.2C  Wrist 2.3 2.5 0.2 26.5 32.9 19.5 Wrist Base 1st Digit     Ulnar Anti Sensory (5th Digit)  30.4C  Wrist *3.8 3.7 0.1 18.3 19.2 4.7 Wrist 5th Digit *37 38 1   Motor Left/Right Comparison   Stim Site L Lat (ms) R Lat (ms) L-R Lat (ms) L Amp (mV) R Amp (mV)  L-R Amp (%) Site1 Site2 L Vel (m/s) R Vel (m/s) L-R Vel (m/s)  Median Motor (Abd Poll Brev)  30.3C  Wrist *4.4 *5.9 *1.5 12.1 *3.8 *68.6 Elbow Wrist *49 *44 5  Elbow 8.8 10.6 1.8 11.7 3.7 68.4       Ulnar Motor (Abd Dig Min)  30.4C  Wrist 3.2 3.0 0.2 5.9 7.9 *25.3 B Elbow Wrist 64 55 9  B Elbow 6.5 6.8 0.3 5.2 7.4 29.7 A Elbow B Elbow 53 62 9  A Elbow 8.4 8.4 0.0 4.9 7.4 33.8          Waveforms:                      Clinical History: No specialty comments available.   She reports that she has been smoking cigarettes. She has a 19.50 pack-year smoking history. She has never used smokeless tobacco.  Recent Labs    12/26/20 0232  HGBA1C 5.2    Objective:  VS:  HT:    WT:   BMI:     BP:   HR: bpm  TEMP: ( )  RESP:  Physical Exam Vitals and nursing note reviewed.  Constitutional:      General: She is not in acute distress.    Appearance: Normal appearance. She is well-developed. She is not ill-appearing.  HENT:     Head: Normocephalic and atraumatic.     Right Ear: External ear normal.     Left Ear: External ear  normal.  Eyes:     Extraocular Movements: Extraocular movements intact.     Conjunctiva/sclera: Conjunctivae normal.     Pupils: Pupils are equal, round, and reactive to light.  Cardiovascular:     Rate and Rhythm: Normal rate.     Pulses: Normal pulses.  Pulmonary:     Effort: Pulmonary effort is normal.  Musculoskeletal:        General: Tenderness present. No swelling or deformity.     Cervical back: Tenderness present. No rigidity.     Right lower leg: No edema.     Left lower leg: No edema.     Comments: Patient has good strength in the upper extremities including 5 out of 5 strength in wrist extension long finger flexion and APB.  There is no atrophy of the hands intrinsically.  There is a negative Hoffmann's test.  Decree sensation to median nerve distribution on the right compared to left.  She has some osteoarthritic changes in both hands.   She has some flattening of the right APB compared to the left.  No intrinsic hand atrophy.   Lymphadenopathy:     Cervical: No cervical adenopathy.  Skin:    General: Skin is warm and dry.     Findings: No erythema, lesion or rash.  Neurological:     General: No focal deficit present.     Mental Status: She is alert and oriented to person, place, and time.     Cranial Nerves: No cranial nerve deficit.     Sensory: Sensory deficit present.     Motor: No weakness or abnormal muscle tone.     Coordination: Coordination normal.     Gait: Gait normal.  Psychiatric:        Mood and Affect: Mood normal.        Behavior: Behavior normal.     Ortho Exam  Imaging: No results found.  Past Medical/Family/Surgical/Social History: Medications & Allergies reviewed per EMR, new medications updated. Patient Active Problem List   Diagnosis Date Noted   COVID-19 virus infection 04/14/2021   Hypokalemia 04/14/2021   Chronic kidney disease, stage 3a ([place]) 04/14/2021   Elevated troponin 12/25/2020   Dysphagia 12/25/2020   Acute on chronic congestive heart failure ([name])    Cocaine abuse ([place])    Atrial flutter with rapid ventricular response ([place]) 05/28/2020   Autoimmune disease ([place]) 12/27/2018   Laryngitis, chronic 12/26/2018   Febrile neutropenia ([place]) 12/16/2018   Chronic anticoagulation 10/14/2018   CRI (chronic renal insufficiency), stage 3 (moderate) ([place]) 10/14/2018   Upper airway cough syndrome 06/30/2018   Tobacco dependence in remission 04/11/2018   Substance abuse in remission ([place]) 04/11/2018   Depression, recurrent ([place]) 04/11/2018   PAF (paroxysmal atrial fibrillation) ([place]) 03/10/2018   Acute pulmonary edema ([name])    Persistent atrial fibrillation with RVR ([name])    Nonrheumatic mitral valve regurgitation    Acute on chronic diastolic CHF (congestive heart failure) ([place]) 03/08/2018   Hypertensive urgency 03/08/2018   Acute respiratory failure with hypoxia ([place]) 03/08/2018    Unspecified atrial flutter ([place]) 04/25/2016   Tobacco use 11/30/2015   Pancytopenia, acquired ([place]) 07/16/2015   Head and neck lymphadenopathy 07/16/2015   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 05/23/18  05/08/2015   Absolute anemia 02/25/2015   Preventive measure 12/27/2014   Primary stabbing headache 12/26/2014   History of stroke 12/26/2014   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   Headache 02/22/2014   Dental caries 01/09/2014   Cough 11/13/2013  Cigarette smoker 11/13/2013   Swelling of gums 11/13/2013   Fatigue 11/10/2013   Intracranial atherosclerosis 11/10/2013   Hyperreflexia 10/04/2013   TIA (transient ischemic attack) 10/04/2013   Fibroid uterus 09/14/2012   Perimenopause 09/14/2012   Chronic cough 08/12/2012   DOE (dyspnea on exertion) 08/12/2012   History of CVA (cerebrovascular accident) 08/09/2012   Pulmonary hypertension ([place]) 04/06/2012   Substance abuse ([place]) 04/06/2012   History of cocaine abuse ([place]) 11/16/2011   Major depressive disorder, recurrent episode with psychotic features. 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 Left heart cath (10/12): 30% mid LAD, 30% ostial D1, 50% ostial D2.     Cough 11/13/2013   Depression    Headache 02/22/2014   Hx of cardiovascular stress test    Lexiscan Myoview  (2/16): Breast attenuation artifact, no ischemia, EF 64%; low risk   Hyperlipemia    Hypertension    Iron deficiency    hx of   Leukocytoclastic vasculitis ([name])    Rash across lower body, occurred in 9/11, diagnosed by skin biopsy. ANA positive. Thought to be secondary to cocaine use.   Leukopenia 03/21/2013   Lymphadenopathy 03/21/2013   Lymphadenopathy 01/30/2014   Mental disorder    Pancytopenia ([place])    Pancytopenia, acquired ([place]) 07/16/2015   Polysubstance abuse ([place])    Prior cocaine   Pulmonary HTN ([place])    Echo (9/11) with EF 65%, mild LVH, mild AI, mild MR, moderate to possibly severe TR with PA systolic pressure 52 mmHg. Echo (10/12): severe LV hypertrophy, EF 60-65%, mild MR, mild AI, moderate to severe tricuspid regurgitation, PA systolic pressure 38 mmHg.  Echo 4/14: moderate LVH, EF 65%, normal wall motion, diastolic dysfunction, mild AI, mild MR   Shortness of breath    a. PFTs 5/14:  FEV1/FVC 99% predicted; FVC 60% predicted; DLCO mildly reduced, mild restriction, air trapping.;  b.  seen by pulmo (Dr. [name]) 5/14: mainly upper airway symptoms - ACE d/c'd (sx's better)   Stroke [facility]) 2010ish   Syphilis 04/25/2013   Tobacco abuse    Tricuspid regurgitation    Unspecified deficiency anemia 03/29/2013   Family History  Problem Relation Age of Onset   Coronary artery disease Mother    Diabetes Mother    Diabetes Father    Hypertension Father    Coronary artery disease Father    Heart attack Father    Breast cancer Maternal Grandmother    Diabetes Maternal Grandmother    Heart attack Maternal Grandmother    Stroke Maternal Grandmother    Asthma Grandson    Past Surgical History:  Procedure Laterality Date   ANKLE SURGERY     right   BONE MARROW ASPIRATION     CARDIAC CATHETERIZATION     CARDIAC CATHETERIZATION N/A 04/23/2015   Procedure: Right Heart Cath;  Surgeon: [name], MD;  Location: [place] [facility];  Service: Cardiovascular;  Laterality: N/A;    CARDIOVERSION N/A 03/11/2018   Procedure: CARDIOVERSION;  Surgeon: [name], MD;  Location: [facility] ENDOSCOPY;  Service: Cardiovascular;  Laterality: N/A;   I & D EXTREMITY  08/09/2011   Procedure: IRRIGATION AND DEBRIDEMENT EXTREMITY;  Surgeon: [name], MD;  Location: [place];  Service: General;  Laterality: Left;  i & D Left axilla abscess   LYMPH NODE BIOPSY N/A 04/05/2013   Procedure: EXCISIONAL BIOPSY RIGHT SUBMANDIBULAR NODE, NASAL ENDOSCOPIC WITH BIOPSY NASAL PHARYNX;  Surgeon: [name], MD;  Location: [place];  Service: ENT;  Laterality: N/A;   MULTIPLE EXTRACTIONS WITH ALVEOLOPLASTY N/A 03/03/2019   Procedure: MULTIPLE EXTRACTION WITH ALVEOLOPLASTY;  Surgeon: [name], DDS;  Location: [place];  Service: Oral Surgery;  Laterality: N/A;   TEE WITHOUT CARDIOVERSION N/A 03/11/2018   Procedure: TRANSESOPHAGEAL ECHOCARDIOGRAM (TEE);  Surgeon: [name], MD;  Location: [place];  Service: Cardiovascular;  Laterality: N/A;   TEE WITHOUT CARDIOVERSION N/A 12/27/2020   Procedure: TRANSESOPHAGEAL ECHOCARDIOGRAM (TEE);  Surgeon: [name], DO;  Location: [facility] ENDOSCOPY;  Service: Cardiovascular;  Laterality: N/A;   Social History   Occupational History   Occupation: unemployed  Tobacco Use   Smoking status: Every Day    Packs/day: 0.50    Years: 39.00    Total pack years: 19.50    Types: Cigarettes   Smokeless tobacco: Never  Vaping Use   Vaping Use: Never used  Substance and Sexual Activity   Alcohol use: Not Currently    Comment: used in the past   Drug use: Yes    Frequency: 7.0 times per week    Types: "Crack" cocaine    Comment: last use Monday 12/23/2020   Sexual activity: Not Currently

## 2021-12-12 ENCOUNTER — Other Ambulatory Visit (HOSPITAL_BASED_OUTPATIENT_CLINIC_OR_DEPARTMENT_OTHER): Payer: Self-pay | Admitting: Family

## 2021-12-12 ENCOUNTER — Other Ambulatory Visit: Payer: Self-pay

## 2021-12-12 DIAGNOSIS — D6859 Other primary thrombophilia: Secondary | ICD-10-CM

## 2021-12-12 DIAGNOSIS — I4819 Other persistent atrial fibrillation: Secondary | ICD-10-CM

## 2021-12-12 DIAGNOSIS — I10 Essential (primary) hypertension: Secondary | ICD-10-CM

## 2021-12-12 MED ORDER — LISINOPRIL 10 MG PO TABS
10.0000 mg | ORAL_TABLET | Freq: Every day | ORAL | 6 refills | Status: DC
  Filled 2021-12-12: qty 30, 30d supply, fill #0
  Filled 2022-01-21 – 2022-02-09 (×2): qty 30, 30d supply, fill #1

## 2021-12-12 NOTE — Telephone Encounter (Signed)
Prescription refill request for Xarelto received.  Indication: afib  Last office visit: 10/14/2021, Gilford Rile Weight: 85.7 kg  Age: 56 yo  Scr: 2.01, 11/06/2021 CrCl: 41 ml/min   Per dosing criteria pt qualifies for a dose decrease.

## 2021-12-15 ENCOUNTER — Other Ambulatory Visit: Payer: Self-pay

## 2021-12-15 MED ORDER — OMEPRAZOLE 40 MG PO CPDR
40.0000 mg | DELAYED_RELEASE_CAPSULE | Freq: Every day | ORAL | 2 refills | Status: DC
  Filled 2021-12-15: qty 30, 30d supply, fill #0
  Filled 2022-01-21 – 2022-02-09 (×2): qty 30, 30d supply, fill #1
  Filled 2022-03-19 – 2022-04-01 (×2): qty 30, 30d supply, fill #2

## 2021-12-16 ENCOUNTER — Other Ambulatory Visit: Payer: Self-pay

## 2021-12-18 ENCOUNTER — Other Ambulatory Visit: Payer: Self-pay

## 2021-12-18 ENCOUNTER — Encounter: Payer: Self-pay | Admitting: Pharmacist

## 2021-12-18 MED ORDER — RIVAROXABAN 15 MG PO TABS
15.0000 mg | ORAL_TABLET | Freq: Every day | ORAL | 5 refills | Status: DC
  Filled 2021-12-18: qty 30, 30d supply, fill #0
  Filled 2022-01-21 – 2022-02-09 (×2): qty 30, 30d supply, fill #1
  Filled 2022-03-19 – 2022-04-01 (×2): qty 30, 30d supply, fill #2
  Filled 2022-05-29: qty 30, 30d supply, fill #3
  Filled 2022-08-20: qty 30, 30d supply, fill #4
  Filled 2022-09-18 – 2022-10-02 (×2): qty 30, 30d supply, fill #5

## 2021-12-19 ENCOUNTER — Other Ambulatory Visit: Payer: Self-pay

## 2021-12-23 ENCOUNTER — Encounter (HOSPITAL_BASED_OUTPATIENT_CLINIC_OR_DEPARTMENT_OTHER): Payer: Self-pay | Admitting: Family

## 2021-12-23 ENCOUNTER — Ambulatory Visit (INDEPENDENT_AMBULATORY_CARE_PROVIDER_SITE_OTHER): Payer: Medicaid Other | Admitting: Family

## 2021-12-23 ENCOUNTER — Other Ambulatory Visit: Payer: Self-pay

## 2021-12-23 VITALS — BP 114/84 | HR 92 | Ht 66.0 in | Wt 197.0 lb

## 2021-12-23 DIAGNOSIS — N1831 Chronic kidney disease, stage 3a: Secondary | ICD-10-CM

## 2021-12-23 DIAGNOSIS — R5383 Other fatigue: Secondary | ICD-10-CM | POA: Diagnosis not present

## 2021-12-23 DIAGNOSIS — I483 Typical atrial flutter: Secondary | ICD-10-CM

## 2021-12-23 DIAGNOSIS — D6859 Other primary thrombophilia: Secondary | ICD-10-CM | POA: Diagnosis not present

## 2021-12-23 DIAGNOSIS — I5032 Chronic diastolic (congestive) heart failure: Secondary | ICD-10-CM

## 2021-12-23 NOTE — Progress Notes (Signed)
Office Visit    Patient Name: Morgan Bean Date of Encounter: 12/23/2021  PCP:  [name], MD   [place]  Cardiologist:  [name], MD  Advanced Practice Provider:  No care team member to display Electrophysiologist:  None      Chief Complaint    Morgan Bean is a 56 y.o. female with a hx of left atrial appendage thrombus, atrial flutter post ablation with recurrent atrial flutter/fib, cocaine use, anxiety, hypertension CKDIIIa, chronic diastolic heart failure, prior CVA, mild pulmonary hypertension, COPD, OSA not on CPAP, depression presents today for follow-up of atrial fibrillation/flutter  Past Medical History    Past Medical History:  Diagnosis Date   Anemia of other chronic disease 11/13/2013   Anginal pain ([place])    none in past year   Anxiety    Asthma    patient denies   Candidiasis 06/23/2013   Chronic diastolic CHF (congestive heart failure) ([place])    Constipation    Coronary artery disease    Lexiscan myoview (10/12) with significant ST depression upon Lexiscan injection but no ischemia or infarction on perfusion images. Left heart cath (10/12): 30% mid LAD, 30% ostial D1, 50% ostial D2.     Cough 11/13/2013   Depression    Headache 02/22/2014   Hx of cardiovascular stress test    Lexiscan Myoview (2/16): Breast attenuation artifact, no ischemia, EF 64%; low risk   Hyperlipemia    Hypertension    Iron deficiency    hx of   Leukocytoclastic vasculitis ([name])    Rash across lower body, occurred in 9/11, diagnosed by skin biopsy. ANA positive. Thought to be secondary to cocaine use.   Leukopenia 03/21/2013   Lymphadenopathy 03/21/2013   Lymphadenopathy 01/30/2014   Mental disorder    Pancytopenia ([place])    Pancytopenia, acquired ([place]) 07/16/2015   Polysubstance abuse ([place])    Prior cocaine   Pulmonary HTN ([place])    Echo (9/11) with EF 65%, mild LVH, mild AI, mild MR, moderate to possibly severe TR with PA systolic pressure  52 mmHg. Echo (10/12): severe LV hypertrophy, EF 60-65%, mild MR, mild AI, moderate to severe tricuspid regurgitation, PA systolic pressure 38 mmHg.  Echo 4/14: moderate LVH, EF 65%, normal wall motion, diastolic dysfunction, mild AI, mild MR   Shortness of breath    a. PFTs 5/14:  FEV1/FVC 99% predicted; FVC 60% predicted; DLCO mildly reduced, mild restriction, air trapping.;  b.  seen by pulmo (Dr. [name]) 5/14: mainly upper airway symptoms - ACE d/c'd (sx's better)   Stroke [facility]) 2010ish   Syphilis 04/25/2013   Tobacco abuse    Tricuspid regurgitation    Unspecified deficiency anemia 03/29/2013   Past Surgical History:  Procedure Laterality Date   ANKLE SURGERY     right   BONE MARROW ASPIRATION     CARDIAC CATHETERIZATION     CARDIAC CATHETERIZATION N/A 04/23/2015   Procedure: Right Heart Cath;  Surgeon: [name], MD;  Location: [place] CV LAB;  Service: Cardiovascular;  Laterality: N/A;   CARDIOVERSION N/A 03/11/2018   Procedure: CARDIOVERSION;  Surgeon: [name], MD;  Location: [facility] ENDOSCOPY;  Service: Cardiovascular;  Laterality: N/A;   I & D EXTREMITY  08/09/2011   Procedure: IRRIGATION AND DEBRIDEMENT EXTREMITY;  Surgeon: [name], MD;  Location: [place];  Service: General;  Laterality: Left;  i & D Left axilla abscess   LYMPH NODE BIOPSY N/A 04/05/2013   Procedure: EXCISIONAL  BIOPSY RIGHT SUBMANDIBULAR NODE, NASAL ENDOSCOPIC WITH BIOPSY NASAL PHARYNX;  Surgeon: [name], MD;  Location: [place];  Service: ENT;  Laterality: N/A;   MULTIPLE EXTRACTIONS WITH ALVEOLOPLASTY N/A 03/03/2019   Procedure: MULTIPLE EXTRACTION WITH ALVEOLOPLASTY;  Surgeon: [name], DDS;  Location: [place];  Service: Oral Surgery;  Laterality: N/A;   TEE WITHOUT CARDIOVERSION N/A 03/11/2018   Procedure: TRANSESOPHAGEAL ECHOCARDIOGRAM (TEE);  Surgeon: [name], MD;  Location: [place];  Service: Cardiovascular;  Laterality: N/A;   TEE WITHOUT CARDIOVERSION N/A 12/27/2020    Procedure: TRANSESOPHAGEAL ECHOCARDIOGRAM (TEE);  Surgeon: [name], DO;  Location: [facility] ENDOSCOPY;  Service: Cardiovascular;  Laterality: N/A;   Allergies  Allergies  Allergen Reactions   Ciprofloxacin Itching   History of Present Illness    Morgan Bean is a 56 y.o. female with a hx of left atrial appendage thrombus, atrial flutter post ablation with recurrent atrial flutter/fib, cocaine use, anxiety, hypertension CKDIIIa, chronic diastolic heart failure, prior CVA, mild pulmonary hypertension, COPD, OSA not on CPAP, depression last seen 10/14/21  Admitted 02/2018 with acute on chronic diastolic heart failure and atrial fib/flutter with RVR. Echo with LVEF 50-55%, mild AI, moderate to severe MR. Diltiazem and Xarelto started.   ED visit 04/2018 due to atrial flutter. Cardiac enzymes unremarkable, CXR normal, urine tox positive for amphetamines. She had cardioversion but did not maintain NSR. She was subsequently started on Amiodarone and maintained NSR. Last seen in clinic 12/2018.  She was readmitted 12/2020 with community acquired pneumonia and atrial flutter. Positive for cocaine on admission. TEE showed left atrial appendage thrombus so cardioversion could not be pursued. LVEF 55-60%, RV normal size/function, mild to moderate MR, moderate AI. She was discharged on Xarelto, Amiodarone, Diltiazem with plans to schedule outpatient cardioversion.   She was lost to follow-up until 04/10/2021. Found to be in atrial fibrillation 140 bpm.  Diltiazem 300 mg daily, Xarelto 20 mg daily were resumed. Admitted 1/22 - 04/17/2021 with A-fib RVR, acute on chronic diastolic heart failure, [id number].  She had used cocaine 1 day prior to hospitalization.  Echo 04/14/2021 LVEF 60 to 65%, no RWMA, bilateral atria moderately dilated, mild MR. At follow-up 06/26/2021 she was set up for TEE and cardioversion but she canceled procedure.  Seen 09/2021 and preferred to proceed with rate control of atrial flutter - Diltiazem  increased to 360 mg daily. She had been clean for 2 months and was living with her mother.   Presents today for follow-up with her mom. Exercising by walking on treadmill at home. Enjoys spending time with her dog. Notes generalized fatigue with trouble staying asleep. Mom says she does not snore. Reports no shortness of breath and mild stable dyspnea on exertion with heat or more than usual activity. Reports no chest pain, pressure, or tightness. No edema, orthopnea, PND. Reports no palpitations.    EKGs/Labs/Other Studies Reviewed:   The following studies were reviewed today:  TEE 12/27/20  1. Left ventricular ejection fraction, by estimation, is 55 to 60%. The  left ventricle has normal function.   2. Right ventricular systolic function is normal. The right ventricular  size is normal.   3. A left atrial/left atrial appendage thrombus was detected. The LAA  emptying velocity was 22 cm/s.   4. The mitral valve is normal in structure. Mild to moderate mitral valve  regurgitation.   5. The aortic valve is tricuspid. Aortic valve regurgitation is moderate.   Echo 12/25/20  1. Left ventricular ejection fraction, by estimation,  is 60 to 65%. The  left ventricle has normal function. The left ventricle has no regional  wall motion abnormalities. There is mild left ventricular hypertrophy.  Left ventricular diastolic function  could not be evaluated.   2. Right ventricular systolic function is normal. The right ventricular  size is normal. There is normal pulmonary artery systolic pressure. The  estimated right ventricular systolic pressure is 01.7 mmHg.   3. Left atrial size was severely dilated.   4. Right atrial size was severely dilated.   5. The mitral valve is abnormal. Moderate mitral valve regurgitation.  Moderate mitral annular calcification.   6. The tricuspid valve is abnormal. Tricuspid valve regurgitation is  moderate.   7. The aortic valve is tricuspid. Aortic valve  regurgitation is moderate.  Mild aortic valve sclerosis is present, with no evidence of aortic valve  stenosis. Aortic regurgitation PHT measures 498 msec.   8. The inferior vena cava is normal in size with <50% respiratory  variability, suggesting right atrial pressure of 8 mmHg.   Comparison(s): Changes from prior study are noted. 01/25/2019: LVEF 60-65%,  mild MR, mild to moderate AI, mild TR.   EKG:  EKG is ordered today.  The ekg ordered today demonstrates atrial flutter 92 bpm withstable T wave inversion in lead V6, no acute ST/T wave changes.  Recent Labs: 12/25/2020: TSH 2.038 12/27/2020: Magnesium 2.2 04/13/2021: B Natriuretic Peptide 1,212.6 05/08/2021: ALT 12; Hemoglobin 12.7; Platelets 217 11/06/2021: BUN 30; Creatinine, Ser 2.01; Potassium 4.7; Sodium 138  Recent Lipid Panel    Component Value Date/Time   CHOL 152 12/26/2020 0232   TRIG 51 12/26/2020 0232   HDL 48 12/26/2020 0232   CHOLHDL 3.2 12/26/2020 0232   VLDL 10 12/26/2020 0232   LDLCALC 94 12/26/2020 0232   LDLDIRECT 142.1 12/25/2010 0846    Risk Assessment/Calculations:   CHA2DS2-VASc Score = 4   This indicates a 4.8% annual risk of stroke. The patient's score is based upon: CHF History: 1 HTN History: 1 Diabetes History: 0 Stroke History: 0 Vascular Disease History: 1 Age Score: 0 Gender Score: 1  Home Medications   No outpatient medications have been marked as taking for the 12/23/21 encounter (Appointment) with Loel Dubonnet, NP.     Review of Systems     All other systems reviewed and are otherwise negative except as noted above.  Physical Exam    VS:  LMP 04/24/2013 Comment: states periods come and go , BMI There is no height or weight on file to calculate BMI.  Wt Readings from Last 3 Encounters:  10/30/21 189 lb (85.7 kg)  10/14/21 188 lb (85.3 kg)  07/22/21 183 lb 3.2 oz (83.1 kg)    GEN: Well nourished, well developed, in no acute distress. HEENT: normal. Neck: Supple, no JVD,  carotid bruits, or masses. Cardiac: , irregularly irregular, no  rubs, or gallops. No clubbing, cyanosis, edema. Gr2/6 murmur.  Radials/PT 2+ and equal bilaterally.  Respiratory:  Respirations regular and unlabored, clear to auscultation bilaterally. GI: Soft, nontender, nondistended. MS: No deformity or atrophy. Skin: Warm and dry, no rash. Neuro:  Strength and sensation are intact. Psych: Normal affect.  Assessment & Plan    Atrial flutter / Atrial fibrillation / LAA thrombus / Fatigue - Ablative therapy in 2018. Admission 12/2020 with recurrent atrial flutter and LAA thrombus unable to be cardioverted.  Rate controlled today on Diltiazem '360mg'$  QD.  Avoid beta-blocker due to previous cocaine use.  Continue Xarelto '15mg'$  QD.  Denies  bleeding complications.  CHA2DS2-VASc Score = 4 [CHF History: 1, HTN History: 1, Diabetes History: 0, Stroke History: 0, Vascular Disease History: 1, Age Score: 0, Gender Score: 1].  Therefore, the patient's annual risk of stroke is 4.8 %. Update CBC, CMP today. She has cancelled multiple previous cardioversions. She is unaware of palpitations but does note fatigue. Discussed that this could be related to deconditioning vs atrial flutter. Discussed possible cardioversion. She prefers to proceed with laboratory evaluation of fatigue, increase activity, and proceed with rate control.  Thyroid panel, magnesium today.   HTN - BP well controlled today. Continue current medications.  Cocaine use - Complete cessation encouraged.  Has not used in approx 5 months that she has been living with her mother.  Is working to reduce her intake of cigarettes.  Resources provided for coping with quitting, substance abuse and mental health helpline.  Encouraged to going to support group.  CKDIIIa - Careful titration of diuretic and antihypertensive.  Follows with [employer]   Diastolic heart failure -Euvolemic on exam with no edema. Continue Lasix '40mg'$  QD. Low sodium diet, fluid  restriction <2L, and daily weights encouraged. Educated to contact our office for weight gain of 2 lbs overnight or 5 lbs in one week.   COPD - Continue to follow with PCP.   Disposition: Follow up in 2 months with Loel Dubonnet, NP and in 4-5 months with Dr. Oval Linsey  Signed, Loel Dubonnet, NP 12/23/2021, 7:53 AM [place]

## 2021-12-23 NOTE — Patient Instructions (Addendum)
Medication Instructions:  Your Physician recommend you continue on your current medication as directed.    *If you need a refill on your cardiac medications before your next appointment, please call your pharmacy*   Lab Work: Your physician recommends that you return for lab work today- CBC, Thyroid Panel, mag, CMP  If you have labs (blood work) drawn today and your tests are completely normal, you will receive your results only by: MyChart Message (if you have MyChart) OR A paper copy in the mail If you have any lab test that is abnormal or we need to change your treatment, we will call you to review the results.  Follow-Up: At [facility], you and your health needs are our priority.  As part of our continuing mission to provide you with exceptional heart care, we have created designated Provider Care Teams.  These Care Teams include your primary Cardiologist (physician) and Advanced Practice Providers (APPs -  Physician Assistants and Nurse Practitioners) who all work together to provide you with the care you need, when you need it.  We recommend signing up for the patient portal called "MyChart".  Sign up information is provided on this After Visit Summary.  MyChart is used to connect with patients for Virtual Visits (Telemedicine).  Patients are able to view lab/test results, encounter notes, upcoming appointments, etc.  Non-urgent messages can be sent to your provider as well.   To learn more about what you can do with MyChart, go to [email or web address].    Your next appointment:   2 months with Laurann Montana, NP     &  4-5 months with Dr. Oval Linsey   Other Instructions Heart Healthy Diet Recommendations: A low-salt diet is recommended. Meats should be grilled, baked, or boiled. Avoid fried foods. Focus on lean protein sources like fish or chicken with vegetables and fruits. The American Heart Association is a Microbiologist!  American Heart Association Diet and  Lifeystyle Recommendations   Exercise recommendations: The American Heart Association recommends 150 minutes of moderate intensity exercise weekly. Try 30 minutes of moderate intensity exercise 4-5 times per week. This could include walking, jogging, or swimming.   Important Information About Sugar

## 2021-12-24 LAB — COMPREHENSIVE METABOLIC PANEL
ALT: 22 IU/L (ref 0–32)
AST: 20 IU/L (ref 0–40)
Albumin/Globulin Ratio: 1.5 (ref 1.2–2.2)
Albumin: 4.7 g/dL (ref 3.8–4.9)
Alkaline Phosphatase: 113 IU/L (ref 44–121)
BUN/Creatinine Ratio: 27 — ABNORMAL HIGH (ref 9–23)
BUN: 44 mg/dL — ABNORMAL HIGH (ref 6–24)
Bilirubin Total: 0.2 mg/dL (ref 0.0–1.2)
CO2: 19 mmol/L — ABNORMAL LOW (ref 20–29)
Calcium: 9.7 mg/dL (ref 8.7–10.2)
Chloride: 104 mmol/L (ref 96–106)
Creatinine, Ser: 1.65 mg/dL — ABNORMAL HIGH (ref 0.57–1.00)
Globulin, Total: 3.2 g/dL (ref 1.5–4.5)
Glucose: 99 mg/dL (ref 70–99)
Potassium: 4.8 mmol/L (ref 3.5–5.2)
Sodium: 139 mmol/L (ref 134–144)
Total Protein: 7.9 g/dL (ref 6.0–8.5)
eGFR: 36 mL/min/{1.73_m2} — ABNORMAL LOW (ref >59)

## 2021-12-24 LAB — CBC
Hematocrit: 41.9 % (ref 34.0–46.6)
Hemoglobin: 13.8 g/dL (ref 11.1–15.9)
MCH: 26.4 pg — ABNORMAL LOW (ref 26.6–33.0)
MCHC: 32.9 g/dL (ref 31.5–35.7)
MCV: 80 fL (ref 79–97)
Platelets: 210 10*3/uL (ref 150–450)
RBC: 5.22 x10E6/uL (ref 3.77–5.28)
RDW: 14.6 % (ref 11.7–15.4)
WBC: 6.3 10*3/uL (ref 3.4–10.8)

## 2021-12-24 LAB — THYROID PANEL WITH TSH
Free Thyroxine Index: 1.8 (ref 1.2–4.9)
T3 Uptake Ratio: 31 % (ref 24–39)
T4, Total: 5.8 ug/dL (ref 4.5–12.0)
TSH: 1.59 u[IU]/mL (ref 0.450–4.500)

## 2021-12-24 LAB — MAGNESIUM: Magnesium: 2.4 mg/dL — ABNORMAL HIGH (ref 1.6–2.3)

## 2021-12-29 ENCOUNTER — Other Ambulatory Visit: Payer: Self-pay

## 2021-12-30 ENCOUNTER — Other Ambulatory Visit: Payer: Self-pay | Admitting: Nephrology

## 2021-12-30 DIAGNOSIS — N1832 Chronic kidney disease, stage 3b: Secondary | ICD-10-CM

## 2021-12-30 DIAGNOSIS — I10 Essential (primary) hypertension: Secondary | ICD-10-CM

## 2021-12-31 ENCOUNTER — Other Ambulatory Visit (HOSPITAL_BASED_OUTPATIENT_CLINIC_OR_DEPARTMENT_OTHER): Payer: Self-pay | Admitting: Family

## 2022-01-01 ENCOUNTER — Ambulatory Visit
Admission: RE | Admit: 2022-01-01 | Discharge: 2022-01-01 | Disposition: A | Discharge status: Home / Self Care | Payer: Medicaid Other | Source: Ambulatory Visit | Attending: Nephrology | Admitting: Nephrology

## 2022-01-01 ENCOUNTER — Other Ambulatory Visit: Payer: Self-pay

## 2022-01-01 DIAGNOSIS — N1832 Chronic kidney disease, stage 3b: Secondary | ICD-10-CM

## 2022-01-01 DIAGNOSIS — I10 Essential (primary) hypertension: Secondary | ICD-10-CM

## 2022-01-01 MED ORDER — FUROSEMIDE 40 MG PO TABS
40.0000 mg | ORAL_TABLET | Freq: Every day | ORAL | 3 refills | Status: DC
  Filled 2022-01-01: qty 90, 90d supply, fill #0
  Filled 2022-04-01: qty 90, 90d supply, fill #1
  Filled 2022-08-20: qty 90, 90d supply, fill #2
  Filled 2022-12-08: qty 90, 90d supply, fill #3

## 2022-01-01 NOTE — Telephone Encounter (Signed)
Rx(s) sent to pharmacy electronically.  

## 2022-01-07 ENCOUNTER — Other Ambulatory Visit: Payer: Self-pay

## 2022-01-21 ENCOUNTER — Other Ambulatory Visit: Payer: Self-pay

## 2022-01-22 ENCOUNTER — Other Ambulatory Visit: Payer: Self-pay

## 2022-01-22 MED ORDER — FLUTICASONE PROPIONATE 50 MCG/ACT NA SUSP
2.0000 | Freq: Every day | NASAL | 11 refills | Status: DC
  Filled 2022-01-22 – 2022-02-09 (×2): qty 16, 30d supply, fill #0
  Filled 2022-03-19 – 2022-04-01 (×2): qty 16, 30d supply, fill #1
  Filled 2022-05-29: qty 16, 30d supply, fill #2
  Filled 2022-08-20: qty 16, 30d supply, fill #3
  Filled 2022-09-18 – 2022-10-26 (×3): qty 16, 30d supply, fill #4
  Filled 2022-12-08: qty 16, 30d supply, fill #5
  Filled 2023-01-20: qty 16, 30d supply, fill #6

## 2022-01-23 ENCOUNTER — Other Ambulatory Visit: Payer: Self-pay

## 2022-01-30 ENCOUNTER — Other Ambulatory Visit: Payer: Self-pay

## 2022-02-09 ENCOUNTER — Other Ambulatory Visit: Payer: Self-pay

## 2022-02-11 ENCOUNTER — Other Ambulatory Visit: Payer: Self-pay

## 2022-02-16 ENCOUNTER — Encounter: Payer: Self-pay | Admitting: Gastroenterology

## 2022-02-27 ENCOUNTER — Ambulatory Visit (INDEPENDENT_AMBULATORY_CARE_PROVIDER_SITE_OTHER): Payer: Medicaid Other | Admitting: Family

## 2022-02-27 ENCOUNTER — Other Ambulatory Visit: Payer: Self-pay

## 2022-02-27 ENCOUNTER — Encounter (HOSPITAL_BASED_OUTPATIENT_CLINIC_OR_DEPARTMENT_OTHER): Payer: Self-pay | Admitting: Family

## 2022-02-27 VITALS — BP 100/78 | HR 85 | Ht 66.0 in | Wt 202.0 lb

## 2022-02-27 DIAGNOSIS — I5032 Chronic diastolic (congestive) heart failure: Secondary | ICD-10-CM

## 2022-02-27 DIAGNOSIS — N1831 Chronic kidney disease, stage 3a: Secondary | ICD-10-CM

## 2022-02-27 DIAGNOSIS — I10 Essential (primary) hypertension: Secondary | ICD-10-CM | POA: Diagnosis not present

## 2022-02-27 DIAGNOSIS — D6859 Other primary thrombophilia: Secondary | ICD-10-CM | POA: Diagnosis not present

## 2022-02-27 DIAGNOSIS — I483 Typical atrial flutter: Secondary | ICD-10-CM | POA: Diagnosis not present

## 2022-02-27 DIAGNOSIS — I4819 Other persistent atrial fibrillation: Secondary | ICD-10-CM

## 2022-02-27 DIAGNOSIS — Z720 Tobacco use: Secondary | ICD-10-CM

## 2022-02-27 MED ORDER — LISINOPRIL 5 MG PO TABS
5.0000 mg | ORAL_TABLET | Freq: Every day | ORAL | 5 refills | Status: DC
  Filled 2022-02-27: qty 30, 30d supply, fill #0
  Filled 2022-04-01: qty 30, 30d supply, fill #1
  Filled 2022-05-29: qty 30, 30d supply, fill #2
  Filled 2022-08-20: qty 30, 30d supply, fill #3
  Filled 2022-09-18 – 2022-10-26 (×3): qty 30, 30d supply, fill #4
  Filled 2022-12-08: qty 30, 30d supply, fill #5

## 2022-02-27 MED ORDER — NICOTINE 7 MG/24HR TD PT24
7.0000 mg | MEDICATED_PATCH | Freq: Every day | TRANSDERMAL | 0 refills | Status: DC
  Filled 2022-02-27: qty 28, 28d supply, fill #0

## 2022-02-27 NOTE — Progress Notes (Signed)
Office Visit    Patient Name: Morgan Bean Date of Encounter: 02/27/2022  PCP:  [name], MD   [place]  Cardiologist:  [name], MD  Advanced Practice Provider:  No care team member to display Electrophysiologist:  None      Chief Complaint    Morgan Bean is a 56 y.o. female with a hx of left atrial appendage thrombus, atrial flutter post ablation with recurrent atrial flutter/fib, cocaine use, anxiety, hypertension CKDIIIa, chronic diastolic heart failure, prior CVA, mild pulmonary hypertension, COPD, OSA not on CPAP, depression presents today for follow-up of atrial fibrillation/flutter  Past Medical History    Past Medical History:  Diagnosis Date   Anemia of other chronic disease 11/13/2013   Anginal pain ([place])    none in past year   Anxiety    Asthma    patient denies   Candidiasis 06/23/2013   Chronic diastolic CHF (congestive heart failure) ([place])    Constipation    Coronary artery disease    Lexiscan myoview (10/12) with significant ST depression upon Lexiscan injection but no ischemia or infarction on perfusion images. Left heart cath (10/12): 30% mid LAD, 30% ostial D1, 50% ostial D2.     Cough 11/13/2013   Depression    Headache 02/22/2014   Hx of cardiovascular stress test    Lexiscan Myoview (2/16): Breast attenuation artifact, no ischemia, EF 64%; low risk   Hyperlipemia    Hypertension    Iron deficiency    hx of   Leukocytoclastic vasculitis ([name])    Rash across lower body, occurred in 9/11, diagnosed by skin biopsy. ANA positive. Thought to be secondary to cocaine use.   Leukopenia 03/21/2013   Lymphadenopathy 03/21/2013   Lymphadenopathy 01/30/2014   Mental disorder    Pancytopenia ([place])    Pancytopenia, acquired ([place]) 07/16/2015   Polysubstance abuse ([place])    Prior cocaine   Pulmonary HTN ([place])    Echo (9/11) with EF 65%, mild LVH, mild AI, mild MR, moderate to possibly severe TR with PA systolic pressure  52 mmHg. Echo (10/12): severe LV hypertrophy, EF 60-65%, mild MR, mild AI, moderate to severe tricuspid regurgitation, PA systolic pressure 38 mmHg.  Echo 4/14: moderate LVH, EF 65%, normal wall motion, diastolic dysfunction, mild AI, mild MR   Shortness of breath    a. PFTs 5/14:  FEV1/FVC 99% predicted; FVC 60% predicted; DLCO mildly reduced, mild restriction, air trapping.;  b.  seen by pulmo (Dr. [name]) 5/14: mainly upper airway symptoms - ACE d/c'd (sx's better)   Stroke [facility]) 2010ish   Syphilis 04/25/2013   Tobacco abuse    Tricuspid regurgitation    Unspecified deficiency anemia 03/29/2013   Past Surgical History:  Procedure Laterality Date   ANKLE SURGERY     right   BONE MARROW ASPIRATION     CARDIAC CATHETERIZATION     CARDIAC CATHETERIZATION N/A 04/23/2015   Procedure: Right Heart Cath;  Surgeon: [name], MD;  Location: [place] CV LAB;  Service: Cardiovascular;  Laterality: N/A;   CARDIOVERSION N/A 03/11/2018   Procedure: CARDIOVERSION;  Surgeon: [name], MD;  Location: [facility] ENDOSCOPY;  Service: Cardiovascular;  Laterality: N/A;   I & D EXTREMITY  08/09/2011   Procedure: IRRIGATION AND DEBRIDEMENT EXTREMITY;  Surgeon: [name], MD;  Location: [place];  Service: General;  Laterality: Left;  i & D Left axilla abscess   LYMPH NODE BIOPSY N/A 04/05/2013   Procedure: EXCISIONAL  BIOPSY RIGHT SUBMANDIBULAR NODE, NASAL ENDOSCOPIC WITH BIOPSY NASAL PHARYNX;  Surgeon: [name], MD;  Location: [place];  Service: ENT;  Laterality: N/A;   MULTIPLE EXTRACTIONS WITH ALVEOLOPLASTY N/A 03/03/2019   Procedure: MULTIPLE EXTRACTION WITH ALVEOLOPLASTY;  Surgeon: [name], DDS;  Location: [place];  Service: Oral Surgery;  Laterality: N/A;   TEE WITHOUT CARDIOVERSION N/A 03/11/2018   Procedure: TRANSESOPHAGEAL ECHOCARDIOGRAM (TEE);  Surgeon: [name], MD;  Location: [place];  Service: Cardiovascular;  Laterality: N/A;   TEE WITHOUT CARDIOVERSION N/A 12/27/2020    Procedure: TRANSESOPHAGEAL ECHOCARDIOGRAM (TEE);  Surgeon: [name], DO;  Location: [facility] ENDOSCOPY;  Service: Cardiovascular;  Laterality: N/A;   Allergies  Allergies  Allergen Reactions   Ciprofloxacin Itching   History of Present Illness    Morgan Bean is a 56 y.o. female with a hx of left atrial appendage thrombus, atrial flutter post ablation with recurrent atrial flutter/fib, cocaine use, anxiety, hypertension CKDIIIa, chronic diastolic heart failure, prior CVA, mild pulmonary hypertension, COPD, OSA not on CPAP, depression last seen 12/23/2021  Admitted 02/2018 with acute on chronic diastolic heart failure and atrial fib/flutter with RVR. Echo with LVEF 50-55%, mild AI, moderate to severe MR. Diltiazem and Xarelto started.   ED visit 04/2018 due to atrial flutter. Cardiac enzymes unremarkable, CXR normal, urine tox positive for amphetamines. She had cardioversion but did not maintain NSR. She was subsequently started on Amiodarone and maintained NSR. Last seen in clinic 12/2018.  She was readmitted 12/2020 with community acquired pneumonia and atrial flutter. Positive for cocaine on admission. TEE showed left atrial appendage thrombus so cardioversion could not be pursued. LVEF 55-60%, RV normal size/function, mild to moderate MR, moderate AI. She was discharged on Xarelto, Amiodarone, Diltiazem with plans to schedule outpatient cardioversion.   She was lost to follow-up until 04/10/2021. Found to be in atrial fibrillation 140 bpm.  Diltiazem 300 mg daily, Xarelto 20 mg daily were resumed. Admitted 1/22 - 04/17/2021 with A-fib RVR, acute on chronic diastolic heart failure, [id number].  She had used cocaine 1 day prior to hospitalization.  Echo 04/14/2021 LVEF 60 to 65%, no RWMA, bilateral atria moderately dilated, mild MR. At follow-up 06/26/2021 she was set up for TEE and cardioversion but she canceled procedure.  Seen 09/2021 and preferred to proceed with rate control of atrial flutter - Diltiazem  increased to 360 mg daily. She had been clean for 2 months and was living with her mother.   Follow-up 12/22/2021 she noted persistent fatigue.  She was offered a vaginal prep preferred to proceed with laboratory workup for fatigue.  CBC with no significant anemia, thyroid panel normal magnesium 2.4, kidney function stable.  She presents today for follow up independently. She notes now she is no longer living with her mom and is living with her daughter. Notes she feels her breathing is slightly worse as people in the home smoke. No cocaine use in the home and she has not returned to using.  She has returned to smoking 2-3 cigatettes per day and is interested in quitting with assistance of patch. She is walking 1-2 blocks with her daughter's dogs three times per week. Reports no chest pain, pressure, or tightness. No edema, orthopnea, PND. Reports rare, fleeting palpitations.  Still with fatigue which is overall unchanged. No snoring.   EKGs/Labs/Other Studies Reviewed:   The following studies were reviewed today:  TEE 12/27/20  1. Left ventricular ejection fraction, by estimation, is 55 to 60%. The  left ventricle has normal  function.   2. Right ventricular systolic function is normal. The right ventricular  size is normal.   3. A left atrial/left atrial appendage thrombus was detected. The LAA  emptying velocity was 22 cm/s.   4. The mitral valve is normal in structure. Mild to moderate mitral valve  regurgitation.   5. The aortic valve is tricuspid. Aortic valve regurgitation is moderate.   Echo 12/25/20  1. Left ventricular ejection fraction, by estimation, is 60 to 65%. The  left ventricle has normal function. The left ventricle has no regional  wall motion abnormalities. There is mild left ventricular hypertrophy.  Left ventricular diastolic function  could not be evaluated.   2. Right ventricular systolic function is normal. The right ventricular  size is normal. There is normal  pulmonary artery systolic pressure. The  estimated right ventricular systolic pressure is 57.3 mmHg.   3. Left atrial size was severely dilated.   4. Right atrial size was severely dilated.   5. The mitral valve is abnormal. Moderate mitral valve regurgitation.  Moderate mitral annular calcification.   6. The tricuspid valve is abnormal. Tricuspid valve regurgitation is  moderate.   7. The aortic valve is tricuspid. Aortic valve regurgitation is moderate.  Mild aortic valve sclerosis is present, with no evidence of aortic valve  stenosis. Aortic regurgitation PHT measures 498 msec.   8. The inferior vena cava is normal in size with <50% respiratory  variability, suggesting right atrial pressure of 8 mmHg.   Comparison(s): Changes from prior study are noted. 01/25/2019: LVEF 60-65%,  mild MR, mild to moderate AI, mild TR.   EKG:  EKG is ordered today.  The ekg ordered today demonstrates atrial flutter 85 bpmw with stable TWI V6.   Recent Labs: 04/13/2021: B Natriuretic Peptide 1,212.6 12/23/2021: ALT 22; BUN 44; Creatinine, Ser 1.65; Hemoglobin 13.8; Magnesium 2.4; Platelets 210; Potassium 4.8; Sodium 139; TSH 1.590  Recent Lipid Panel    Component Value Date/Time   CHOL 152 12/26/2020 0232   TRIG 51 12/26/2020 0232   HDL 48 12/26/2020 0232   CHOLHDL 3.2 12/26/2020 0232   VLDL 10 12/26/2020 0232   LDLCALC 94 12/26/2020 0232   LDLDIRECT 142.1 12/25/2010 0846    Risk Assessment/Calculations:   CHA2DS2-VASc Score = 4   This indicates a 4.8% annual risk of stroke. The patient's score is based upon: CHF History: 1 HTN History: 1 Diabetes History: 0 Stroke History: 0 Vascular Disease History: 1 Age Score: 0 Gender Score: 1      Home Medications   Current Meds  Medication Sig   albuterol (VENTOLIN HFA) 108 (90 Base) MCG/ACT inhaler Inhale 2 puffs into the lungs every 6 (six) hours as needed for wheezing or shortness of breath.   atorvastatin (LIPITOR) 40 MG tablet Take 1  tablet (40 mg total) by mouth at bedtime.   Cholecalciferol (VITAMIN D) 50 MCG (2000 UT) CAPS Take 2,000 Units by mouth daily.   diltiazem (CARDIZEM CD) 360 MG 24 hr capsule Take 1 capsule (360 mg total) by mouth daily.   ferrous sulfate 325 (65 FE) MG tablet Take 325 mg by mouth daily with breakfast.   fluticasone (FLONASE) 50 MCG/ACT nasal spray Place 2 sprays into both nostrils daily.   furosemide (LASIX) 40 MG tablet Take 1 tablet (40 mg total) by mouth daily.   hydrOXYzine (ATARAX) 50 MG tablet Take 1 tablet (50 mg total) by mouth every 8 (eight) hours as needed.   nicotine (NICODERM CQ - DOSED IN  MG/24 HR) 7 mg/24hr patch Place 1 patch (7 mg total) onto the skin daily.   omeprazole (PRILOSEC) 40 MG capsule Take 1 capsule (40 mg total) by mouth daily. Take with water, wait 30-60 minutes after taking before eating or drinking anything but water   potassium chloride SA (KLOR-CON M) 20 MEQ tablet Take 1 tablet (20 mEq total) by mouth daily.   Rivaroxaban (XARELTO) 15 MG TABS tablet Take 1 tablet (15 mg total) by mouth daily with supper.   spironolactone (ALDACTONE) 25 MG tablet Take 1 tablet (25 mg total) by mouth once daily.   [DISCONTINUED] lisinopril (ZESTRIL) 10 MG tablet Take 1 tablet (10 mg total) by mouth daily.     Review of Systems     All other systems reviewed and are otherwise negative except as noted above.  Physical Exam    VS:  BP 100/78   Pulse 85   Ht '5\' 6"'$  (1.676 m)   Wt 202 lb (91.6 kg)   LMP 04/24/2013 Comment: states periods come and go  BMI 32.60 kg/m  , BMI Body mass index is 32.6 kg/m.  Wt Readings from Last 3 Encounters:  02/27/22 202 lb (91.6 kg)  12/23/21 197 lb (89.4 kg)  10/30/21 189 lb (85.7 kg)    GEN: Well nourished, well developed, in no acute distress. HEENT: normal. Neck: Supple, no JVD, carotid bruits, or masses. Cardiac: , irregularly irregular, no  rubs, or gallops. No clubbing, cyanosis, edema. Gr2/6 murmur.  Radials/PT 2+ and equal  bilaterally.  Respiratory:  Respirations regular and unlabored, clear to auscultation bilaterally. GI: Soft, nontender, nondistended. MS: No deformity or atrophy. Skin: Warm and dry, no rash. Neuro:  Strength and sensation are intact. Psych: Normal affect.  Assessment & Plan    Atrial flutter / Atrial fibrillation / LAA thrombus / Fatigue - Ablative therapy in 2018. Admission 12/2020 with recurrent atrial flutter and LAA thrombus unable to be cardioverted.  Rate controlled today on Diltiazem '360mg'$  QD.  Avoid beta-blocker due to previous cocaine use.  Continue Xarelto '15mg'$  QD.  Denies bleeding complications.  CHA2DS2-VASc Score = 4 [CHF History: 1, HTN History: 1, Diabetes History: 0, Stroke History: 0, Vascular Disease History: 1, Age Score: 0, Gender Score: 1].  Therefore, the patient's annual risk of stroke is 4.8 %. She has cancelled multiple previous cardioversions. She notes rare, fleeting palpitations but does note fatigue. Discussed that this could be related to deconditioning vs atrial flutter. No snoring suggestive of OSA. Prior labs TSH, electrolytes, CBC unremarkable. She prefers to continue rate control at this time.  HTN -now with relative hypotension with occasional lightheadedness.  Will reduce lisinopril to 5 mg daily.  Cocaine use - Complete cessation encouraged.  Has not used in approx 7 months.  Is working to reduce her intake of cigarettes.  Resources provided for coping with quitting, substance abuse and mental health helpline.  Encouraged going to support group. Rx nicotine '7mg'$  patch x 1 month  CKDIIIa - Careful titration of diuretic and antihypertensive.  Follows with [employer]   Diastolic heart failure -Euvolemic on exam with no edema. Continue Lasix '40mg'$  QD. Low sodium diet, fluid restriction <2L, and daily weights encouraged. Educated to contact our office for weight gain of 2 lbs overnight or 5 lbs in one week.   COPD - Continue to follow with PCP. No evidence of  acute exacerbation  Disposition: Follow up in 2 months with Dr. Oval Linsey for BP check  Signed, Loel Dubonnet, NP 02/27/2022,  10:51 AM [facility]

## 2022-02-27 NOTE — Patient Instructions (Addendum)
Medication Instructions:  Your physician has recommended you make the following change in your medication:    REDUCE your Lisinopril to '5mg'$  daily  You may split your '10mg'$  tablets in half and we have sent a prescription for the lower strength tablet as well  START Nicotine one '7mg'$  patch per day for one month then stop  Do not smoke while wearing  *If you need a refill on your cardiac medications before your next appointment, please call your pharmacy*   Lab Work/Testing/Procedures: Your EKG today shows rate controlled atrial flutter which is stable compared to previous.   Follow-Up: At [facility], you and your health needs are our priority.  As part of our continuing mission to provide you with exceptional heart care, we have created designated Provider Care Teams.  These Care Teams include your primary Cardiologist (physician) and Advanced Practice Providers (APPs -  Physician Assistants and Nurse Practitioners) who all work together to provide you with the care you need, when you need it.  We recommend signing up for the patient portal called "MyChart".  Sign up information is provided on this After Visit Summary.  MyChart is used to connect with patients for Virtual Visits (Telemedicine).  Patients are able to view lab/test results, encounter notes, upcoming appointments, etc.  Non-urgent messages can be sent to your provider as well.   To learn more about what you can do with MyChart, go to [email or web address].    Your next appointment:   As scheduled with Dr. Oval Linsey  Other Instructions  Heart Healthy Diet Recommendations: A low-salt diet is recommended. Meats should be grilled, baked, or boiled. Avoid fried foods. Focus on lean protein sources like fish or chicken with vegetables and fruits. The American Heart Association is a Microbiologist!  American Heart Association Diet and Lifeystyle Recommendations   Exercise recommendations: The American Heart Association  recommends 150 minutes of moderate intensity exercise weekly. Try 30 minutes of moderate intensity exercise 4-5 times per week. This could include walking, jogging, or swimming.  Important Information About Sugar

## 2022-03-16 ENCOUNTER — Emergency Department (HOSPITAL_COMMUNITY)
Admission: EM | Admit: 2022-03-16 | Discharge: 2022-03-16 | Disposition: A | Discharge status: Home / Self Care | Payer: Medicaid Other | Attending: Emergency Medicine | Admitting: Emergency Medicine

## 2022-03-16 ENCOUNTER — Encounter (HOSPITAL_COMMUNITY): Payer: Self-pay

## 2022-03-16 ENCOUNTER — Emergency Department (HOSPITAL_COMMUNITY): Payer: Medicaid Other

## 2022-03-16 ENCOUNTER — Other Ambulatory Visit: Payer: Self-pay

## 2022-03-16 DIAGNOSIS — M5442 Lumbago with sciatica, left side: Secondary | ICD-10-CM | POA: Diagnosis present

## 2022-03-16 DIAGNOSIS — M5441 Lumbago with sciatica, right side: Secondary | ICD-10-CM | POA: Diagnosis not present

## 2022-03-16 DIAGNOSIS — I4891 Unspecified atrial fibrillation: Secondary | ICD-10-CM | POA: Insufficient documentation

## 2022-03-16 LAB — CBC
HCT: 38 % (ref 36.0–46.0)
Hemoglobin: 12 g/dL (ref 12.0–15.0)
MCH: 26.7 pg (ref 26.0–34.0)
MCHC: 31.6 g/dL (ref 30.0–36.0)
MCV: 84.4 fL (ref 80.0–100.0)
Platelets: 212 10*3/uL (ref 150–400)
RBC: 4.5 MIL/uL (ref 3.87–5.11)
RDW: 15.1 % (ref 11.5–15.5)
WBC: 5.9 10*3/uL (ref 4.0–10.5)
nRBC: 0 % (ref 0.0–0.2)

## 2022-03-16 LAB — BASIC METABOLIC PANEL
Anion gap: 8 (ref 5–15)
BUN: 33 mg/dL — ABNORMAL HIGH (ref 6–20)
CO2: 22 mmol/L (ref 22–32)
Calcium: 8.9 mg/dL (ref 8.9–10.3)
Chloride: 112 mmol/L — ABNORMAL HIGH (ref 98–111)
Creatinine, Ser: 1.39 mg/dL — ABNORMAL HIGH (ref 0.44–1.00)
GFR, Estimated: 44 mL/min — ABNORMAL LOW (ref >60)
Glucose, Bld: 97 mg/dL (ref 70–99)
Potassium: 4.1 mmol/L (ref 3.5–5.1)
Sodium: 142 mmol/L (ref 135–145)

## 2022-03-16 MED ORDER — SPIRONOLACTONE 25 MG PO TABS: 25.0000 mg | ORAL_TABLET | Freq: Every day | ORAL | Status: DC

## 2022-03-16 MED ORDER — METOPROLOL TARTRATE 5 MG/5ML IV SOLN
5.0000 mg | Freq: Once | INTRAVENOUS | Status: AC
  Administered 2022-03-16: 5 mg via INTRAVENOUS
  Filled 2022-03-16: qty 5

## 2022-03-16 MED ORDER — LISINOPRIL 10 MG PO TABS
5.0000 mg | ORAL_TABLET | Freq: Every day | ORAL | Status: DC
  Administered 2022-03-16: 5 mg via ORAL
  Filled 2022-03-16: qty 1

## 2022-03-16 MED ORDER — FENTANYL CITRATE PF 50 MCG/ML IJ SOSY
50.0000 ug | PREFILLED_SYRINGE | Freq: Once | INTRAMUSCULAR | Status: AC
  Administered 2022-03-16: 50 ug via INTRAVENOUS
  Filled 2022-03-16: qty 1

## 2022-03-16 MED ORDER — OXYCODONE-ACETAMINOPHEN 5-325 MG PO TABS
1.0000 | ORAL_TABLET | Freq: Three times a day (TID) | ORAL | 0 refills | Status: DC | PRN
  Filled 2022-03-16: qty 6, 1d supply, fill #0

## 2022-03-16 MED ORDER — DILTIAZEM HCL ER COATED BEADS 120 MG PO CP24
360.0000 mg | ORAL_CAPSULE | Freq: Every day | ORAL | Status: DC
  Administered 2022-03-16: 360 mg via ORAL
  Filled 2022-03-16: qty 3

## 2022-03-16 NOTE — ED Triage Notes (Addendum)
Patient c/o bilateral lower back pain x 2 days and radiation of the pain down bilateral  legs since last night.   While in triage HR between 120-146 atrial fib.

## 2022-03-16 NOTE — Discharge Instructions (Addendum)
There is some irritation on your L5 vertebrae that is likely doing the pain.  Follow-up with neurosurgery.  Take your medicines for the fast heart rate and her high blood pressure.

## 2022-03-16 NOTE — ED Provider Notes (Signed)
[place] DEPT Provider Note   CSN: [id number] Arrival date & time: 03/16/22  6962     History {Add pertinent medical, surgical, social history, OB history to HPI:1} Chief Complaint  Patient presents with   Back Pain    Morgan Bean is a 56 y.o. female.   Back Pain Patient presents with low back pain.  Has had for the last couple days.  Radiates to both legs.  No trauma.  Worse with certain movements.  Worse with sitting.  Has had episodic back pain at the time but never like this.  No loss of bladder or bowel control.  States she does have issues getting to the bathroom due to the pain however.  No fevers.  No coughing.  No numbness or weakness. Found to be tachycardic.  History of A-fib with RVR.  Is on anticoagulation.  History of cocaine abuse but has not injected drugs.     Home Medications Prior to Admission medications   Medication Sig Start Date End Date Taking? Authorizing Provider  albuterol (VENTOLIN HFA) 108 (90 Base) MCG/ACT inhaler Inhale 2 puffs into the lungs every 6 (six) hours as needed for wheezing or shortness of breath. 06/24/21   [name], MD  atorvastatin (LIPITOR) 40 MG tablet Take 1 tablet (40 mg total) by mouth at bedtime. 04/10/21   Loel Dubonnet, NP  Cholecalciferol (VITAMIN D) 50 MCG (2000 UT) CAPS Take 2,000 Units by mouth daily.    [provider]  diltiazem (CARDIZEM CD) 360 MG 24 hr capsule Take 1 capsule (360 mg total) by mouth daily. 10/14/21   Loel Dubonnet, NP  ferrous sulfate 325 (65 FE) MG tablet Take 325 mg by mouth daily with breakfast.    [provider]  fluticasone (FLONASE) 50 MCG/ACT nasal spray Place 2 sprays into both nostrils daily. 01/22/22     furosemide (LASIX) 40 MG tablet Take 1 tablet (40 mg total) by mouth daily. 01/01/22   Loel Dubonnet, NP  hydrOXYzine (ATARAX) 50 MG tablet Take 1 tablet (50 mg total) by mouth every 8 (eight) hours as needed. 10/30/21   [name], MD  lisinopril (ZESTRIL) 5 MG tablet Take 1 tablet (5 mg total) by mouth daily. 02/27/22   Loel Dubonnet, NP  nicotine (NICODERM CQ - DOSED IN MG/24 HR) 7 mg/24hr patch Place 1 patch (7 mg total) onto the skin daily. 02/27/22   Loel Dubonnet, NP  omeprazole (PRILOSEC) 40 MG capsule Take 1 capsule (40 mg total) by mouth daily. Take with water, wait 30-60 minutes after taking before eating or drinking anything but water 12/12/21     potassium chloride SA (KLOR-CON M) 20 MEQ tablet Take 1 tablet (20 mEq total) by mouth daily. 11/10/21 05/09/22  Loel Dubonnet, NP  Rivaroxaban (XARELTO) 15 MG TABS tablet Take 1 tablet (15 mg total) by mouth daily with supper. 12/18/21   Loel Dubonnet, NP  spironolactone (ALDACTONE) 25 MG tablet Take 1 tablet (25 mg total) by mouth once daily. 06/26/21   Loel Dubonnet, NP      Allergies    Ciprofloxacin    Review of Systems   Review of Systems  Musculoskeletal:  Positive for back pain.    Physical Exam Updated Vital Signs BP (!) 150/136 (BP Location: Right Arm)   Pulse 73   Temp 99.8 F (37.7 C) (Oral)   Resp 20   Ht '5\' 6"'$  (1.676 m)   Wt 91.6 kg  LMP 04/24/2013 Comment: states periods come and go  SpO2 99%   BMI 32.60 kg/m  Physical Exam Vitals and nursing note reviewed.  HENT:     Head: Atraumatic.  Cardiovascular:     Rate and Rhythm: Tachycardia present. Rhythm irregular.  Abdominal:     Tenderness: There is no abdominal tenderness.  Musculoskeletal:        General: Tenderness present.     Comments: Mid lumbar tenderness.  No deformity.  Pain with straight leg raise bilaterally.  Skin:    General: Skin is warm.     Capillary Refill: Capillary refill takes less than 2 seconds.  Neurological:     Mental Status: She is alert and oriented to person, place, and time.     ED Results / Procedures / Treatments   Labs (all labs ordered are listed, but only abnormal results are displayed) Labs Reviewed  BASIC METABOLIC  PANEL  CBC    EKG None  Radiology No results found.  Procedures Procedures  {Document cardiac monitor, telemetry assessment procedure when appropriate:1}  Medications Ordered in ED Medications - No data to display  ED Course/ Medical Decision Making/ A&P                           Medical Decision Making Amount and/or Complexity of Data Reviewed Labs: ordered. Radiology: ordered.   Patient with low back pain.  Worse with certain movements.  No trauma.  Differential diagnoses long includes musculoskeletal pain.  Compression fractures but also other pathology particular since she is on anticoagulation.  No history of injection drug use. Found to be tachycardic.  Regular in rate will go up to 150.  With history of A-fib but I think we need further evaluation of this.  Reviewed recent cardiology note.  {Document critical care time when appropriate:1} {Document review of labs and clinical decision tools ie heart score, Chads2Vasc2 etc:1}  {Document your independent review of radiology images, and any outside records:1} {Document your discussion with family members, caretakers, and with consultants:1} {Document social determinants of health affecting pt's care:1} {Document your decision making why or why not admission, treatments were needed:1} Final Clinical Impression(s) / ED Diagnoses Final diagnoses:  None    Rx / DC Orders ED Discharge Orders     None

## 2022-03-16 NOTE — ED Notes (Signed)
Patient transported to CT 

## 2022-03-17 ENCOUNTER — Other Ambulatory Visit: Payer: Self-pay

## 2022-03-19 ENCOUNTER — Other Ambulatory Visit (HOSPITAL_BASED_OUTPATIENT_CLINIC_OR_DEPARTMENT_OTHER): Payer: Self-pay | Admitting: Family

## 2022-03-19 ENCOUNTER — Other Ambulatory Visit: Payer: Self-pay

## 2022-03-19 DIAGNOSIS — I110 Hypertensive heart disease with heart failure: Secondary | ICD-10-CM

## 2022-03-19 DIAGNOSIS — I4819 Other persistent atrial fibrillation: Secondary | ICD-10-CM

## 2022-03-19 MED ORDER — SPIRONOLACTONE 25 MG PO TABS
25.0000 mg | ORAL_TABLET | Freq: Every day | ORAL | 1 refills | Status: DC
  Filled 2022-03-19 – 2022-04-01 (×2): qty 90, 90d supply, fill #0
  Filled 2022-08-20: qty 90, 90d supply, fill #1

## 2022-03-25 ENCOUNTER — Other Ambulatory Visit: Payer: Self-pay

## 2022-03-26 ENCOUNTER — Other Ambulatory Visit: Payer: Self-pay

## 2022-03-27 ENCOUNTER — Ambulatory Visit: Payer: Medicaid Other | Admitting: Gastroenterology

## 2022-04-01 ENCOUNTER — Other Ambulatory Visit: Payer: Self-pay

## 2022-04-01 ENCOUNTER — Other Ambulatory Visit: Payer: Self-pay | Admitting: Neurosurgery

## 2022-04-01 DIAGNOSIS — M4317 Spondylolisthesis, lumbosacral region: Secondary | ICD-10-CM

## 2022-04-11 ENCOUNTER — Ambulatory Visit
Admission: RE | Admit: 2022-04-11 | Discharge: 2022-04-11 | Disposition: A | Discharge status: Home / Self Care | Payer: Medicaid Other | Source: Ambulatory Visit | Attending: Neurosurgery | Admitting: Neurosurgery

## 2022-04-11 DIAGNOSIS — M4317 Spondylolisthesis, lumbosacral region: Secondary | ICD-10-CM

## 2022-04-22 ENCOUNTER — Ambulatory Visit (HOSPITAL_COMMUNITY)
Admission: EM | Admit: 2022-04-22 | Discharge: 2022-04-22 | Disposition: A | Discharge status: Home / Self Care | Payer: Medicaid Other | Attending: Family | Admitting: Family

## 2022-04-22 DIAGNOSIS — F32A Depression, unspecified: Secondary | ICD-10-CM | POA: Insufficient documentation

## 2022-04-22 DIAGNOSIS — F1410 Cocaine abuse, uncomplicated: Secondary | ICD-10-CM | POA: Insufficient documentation

## 2022-04-22 DIAGNOSIS — F339 Major depressive disorder, recurrent, unspecified: Secondary | ICD-10-CM

## 2022-04-22 DIAGNOSIS — R45851 Suicidal ideations: Secondary | ICD-10-CM | POA: Insufficient documentation

## 2022-04-22 NOTE — Discharge Instructions (Signed)

## 2022-04-22 NOTE — Progress Notes (Signed)
   04/22/22 1403  [place] (Walk-ins at [facility] only)  How Did You Hear About [place]? Other (Comment)  What Is the Reason for Your Visit/Call Today? Sashia Campas is a 57 year old female presenting to [facility] voluntarily with chief complaint of needing an evaluation. Pt reports she was at the [employer] office today for a psychiatric evaluation and the provider was asking her a lot of questions that made her emotional. Pt reported to the provider that she wanted to "end the pain of living" and if she was to have a plan about killing herself it would be to overdose on her medications. 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  Pt denies SI, HI, AVH and reports drug use of crack cocaine last used a week ago. Pt reports that she has been using crack for about 35 years. Pt reports stressors related to living with her daughter and grandchildren. Pt did not want to give details about what was going on however reports a lot of "drama".  How Long Has This Been Causing You Problems? > than 6 months  Have You Recently Had Any Thoughts About Hurting Yourself? Yes  How long ago did you have thoughts about hurting yourself? today at her appointment  Are You Planning to [place] At This time? No  Have you Recently Had Thoughts About [place]? No  Are You Planning To Harm Someone At This Time? No  Are you currently experiencing any auditory, visual or other hallucinations? No  Have You Used Any Alcohol or Drugs in the Past 24 Hours? No  Do you have any current medical co-morbidities that require immediate attention? No  What Do You Feel Would Help You the Most Today? Treatment for Depression or other mood problem  If access to [facility] was not available, would you have sought care in the Emergency Department? No   Determination of Need Routine (7 days)  Options For Referral Medication Management;Outpatient Therapy

## 2022-04-22 NOTE — ED Provider Notes (Signed)
Behavioral Health Urgent Care Medical Screening Exam  Patient Name: Morgan Bean MRN: [id number] Date of Evaluation: 04/22/22 Chief Complaint:   Diagnosis:  Final diagnoses:  Depression, recurrent ([place])    History of Present illness: Morgan Bean is a 57 y.o. female.  Presents to [facility] urgent care seeking additional outpatient resources to reestablish care.  States she was followed by Lenox Ahr back in 2013 however was taken off of all of her mental health medications due to medical history.  Reports today she was followed by Social Services Case Fre[employer] forwarder and was expressing passive suicidal ideations.  States she was asked about a plan.  States she expressed to the social service worker that she would take all of her medication.  Reported that the worker then contacted the crisis number. Currently she is denying plan or intent.    [name] does report stressors related to psychosocial stressors.  States she has been residing with her daughter since November of last year.  States she has 2 grandchildren who are involved in gang activity.  States "there is a lot of drama in the home."  She does report a history of substance abuse to cocaine.  This provider assisted patient to make a  follow-up appointment with [facility].  Patient reports seeking medication management and therapy services.  Morgan Bean is siting in no acute distress.  Patient is malodorous to urine. she is alert/oriented x 4; calm/cooperative; and mood congruent with affect.  she is speaking in a clear tone at moderate volume, and normal pace; with good eye contact. Her thought process is coherent and relevant; There is no indication that he is currently responding to internal/external stimuli or experiencing delusional thought content; and he has denied suicidal/self-harm/homicidal ideation, psychosis, and paranoia. Patient has remained calm throughout assessment and has answered questions  appropriately.    Morgan Bean is educated and verbalizes understanding of mental health resources and other crisis services in the community. He is instructed to call 911 and present to the nearest emergency room should he experience any suicidal/homicidal ideation, auditory/visual/hallucinations, or detrimental worsening of his mental health condition. he was a also advised by Probation officer that he could call the toll-free phone on insurance card to assist with identifying in network counselors and agencies or number on back of Medicaid card to speak with care coordinator.    [place] ED from 03/16/2022 in [facility] Emergency Department at [facility] ED to Hosp-Admission (Discharged) from 04/13/2021 in [place] [facility] PCU ED to Hosp-Admission (Discharged) from 12/25/2020 in [place] [facility] PCU  C-SSRS RISK CATEGORY No Risk No Risk No Risk       Psychiatric Specialty Exam  Presentation  General Appearance:Appropriate for Environment  Eye Contact:Good  Speech:Clear and Coherent  Speech Volume:Normal  Handedness:Right   Mood and Affect  Mood:Depressed; Anxious  Affect: congruent   Thought Process  Thought Processes:Coherent  Descriptions of Associations:Intact  Orientation:Full (Time, Place and Person)  Thought Content:Logical    Hallucinations:None  Ideas of Reference:Other (comment)  Suicidal Thoughts:No  Homicidal Thoughts:No   Sensorium  Memory:Immediate Good; Recent Fair; Remote Fair  Judgment:Fair  Insight:Good   Executive Functions  Concentration:No data recorded Attention Span:Fair  [place]  Language:Good   Psychomotor Activity  Psychomotor Activity:Normal   Assets  Assets:Desire for Improvement; Intimacy; Social Support   Sleep  Sleep:Fair  Number of hours: No data recorded  Physical Exam: Physical Exam Vitals and nursing note  reviewed.  Cardiovascular:     Rate and  Rhythm: Tachycardia present.  Pulmonary:     Effort: Pulmonary effort is normal.  Psychiatric:        Mood and Affect: Mood normal.        Behavior: Behavior normal.        Thought Content: Thought content normal.    Review of Systems  Psychiatric/Behavioral:  Positive for depression and substance abuse. Suicidal ideas: passive ideations.The patient is nervous/anxious.   All other systems reviewed and are negative.  Blood pressure (!) 142/122, pulse (!) 140, temperature 97.9 F (36.6 C), temperature source Oral, resp. rate 20, last menstrual period 04/24/2013, SpO2 100 %. There is no height or weight on file to calculate BMI. - NP requested that RN Staff repeat vitals at 14:00. It was reported that patient left without AVS and or vitals-14:47   Musculoskeletal: Strength & Muscle Tone: within normal limits Gait & Station: normal Patient leans: N/A   [facility] MSE Discharge Disposition for Follow up and Recommendations: Based on my evaluation the patient does not appear to have an emergency medical condition and can be discharged with resources and follow up care in outpatient services for Medication Management, Substance Abuse Intensive Outpatient Program, and Individual Therapy   [place], NP 04/22/2022, 2:36 PM

## 2022-04-28 ENCOUNTER — Ambulatory Visit (HOSPITAL_BASED_OUTPATIENT_CLINIC_OR_DEPARTMENT_OTHER): Payer: Medicaid Other | Admitting: Cardiovascular Disease

## 2022-04-28 ENCOUNTER — Encounter (HOSPITAL_BASED_OUTPATIENT_CLINIC_OR_DEPARTMENT_OTHER): Payer: Self-pay

## 2022-04-28 NOTE — Progress Notes (Incomplete)
Cardiology Office Note   Date:  04/28/2022   ID:  Morgan Bean, DOB [birth year], MRN [id number]  PCP:  [name], MD  Cardiologist:   Burnett Sheng   No chief complaint on file.    History of Present Illness: Morgan Bean is a 57 y.o. female with chronic diastolic heart failure, mild pulmonary hypertension, prior CVA, CAD, atrial flutter s/p ablation, COPD, OSA not on CPAP, polysubstance abuse, depression, and anxiety here for follow up.  She was admitted 02/2018 with acute on chronic diastolic heart failure and atrial fibrillation/flutter with RVR.  She initially presented with 4 days of shortness of breath that occurred in the setting of heavy cocaine abuse.  She had not been taking her furosemide and reported that she often forgets to take her medications.  Echo that admission revealed LVEF 50 to 55% with mild aortic regurgitation and moderate to severe mitral regurgitation.  She was started on diltiazem and Xarelto.    Morgan Bean was first seen in the ED 04/2018.  She initially came to see our pharmacist and reported shortness of breath, dizziness and chest pain.  There was concern for hypoxia.  EKG showed atrial flutter. She was referred to the ED where cardiac enzymes were negative and EKG was stable.  She has a CXR that showed no acute process.  Urine tox was positive for amphetamines though she denied any use of illegal or legal amphetamines.  She had AKI with creatinine elevated to 1.7.  She was instructed to hold lisinopril for 5 days.  She had a cardioversion that was initially successful but did not maintain.  At her last appointment she was started on amiodarone and subsequently maintained sinus rhythm.  She followed up with Morgan Ransom PA-C on 06/2018 and was doing well.  She saw Morgan Bean due to concern that amiodarone may affect her respiratory status.  However she was deemed okay to continue on amiodarone.  She was again admitted on 12/2020 with pneumonia and atrial flutter, and  was positive for cocaine on admission. TEE showed left atrial appendage thrombus so cardioversion could not be pursued. LVEF 55-60%, RV normal size/function, mild to moderate MR, moderate AI. She was discharged on Xarelto, Amiodarone, Diltiazem with plans to schedule outpatient cardioversion.   She was lost to follow-up until 04/10/2021 where she was found to be in atrial fibrillation at 140bpm. Diltiazem 300 mg daily was added, Xarelto 20 mg daily was resumed. Admitted 1/22 - 04/17/2021 with A-fib RVR, acute on chronic diastolic heart failure, [id number]. She had used cocaine 1 day prior to hospitalization. Echo 04/14/2021 LVEF 60 to 65%, no RWMA, bilateral atria moderately dilated, mild MR. At follow-up 06/26/2021 she was set up for TEE and cardioversion but she canceled procedure. Seen 09/2021 and preferred to proceed with rate control of atrial flutter - Diltiazem increased to 360 mg daily. She notably had moved in with her mother and had been clean for two months. She presented for follow up with Morgan Montana NP on 02/27/2022 where she still had not returned to cocaine use, but returned to smoking due to others in her home smoking. She was still reporting fatigue and shortness of breath. She was in rate controlled atrial fibrillation. She had since had an ED admission for back pain on 03/16/2022 and on x-ray was noted to have bilateral L5 pars fractures. She was in rate controlled A-fib at the time and had been noncompliant with medicines.  Today, the patient states that she  ***  denies any palpitations, chest pain, shortness of breath, or peripheral edema. No lightheadedness, headaches, syncope, orthopnea, or PND.  (+)  Past Medical History:  Diagnosis Date   Anemia of other chronic disease 11/13/2013   Anginal pain ([name])    none in past year   Anxiety    Asthma    patient denies   Autoimmune disease ([place]) 12/27/2018   Candidiasis 06/23/2013   Chronic diastolic CHF (congestive heart failure)  ([place])    Constipation    Coronary artery disease    Lexiscan myoview (10/12) with significant ST depression upon Lexiscan injection but no ischemia or infarction on perfusion images. Left heart cath (10/12): 30% mid LAD, 30% ostial D1, 50% ostial D2.     Cough 11/13/2013   Depression    Headache 02/22/2014   Hx of cardiovascular stress test    Lexiscan Myoview (2/16): Breast attenuation artifact, no ischemia, EF 64%; low risk   Hyperlipemia    Hypertension    Iron deficiency    hx of   Leukocytoclastic vasculitis ([name])    Rash across lower body, occurred in 9/11, diagnosed by skin biopsy. ANA positive. Thought to be secondary to cocaine use.   Leukopenia 03/21/2013   Lymphadenopathy 03/21/2013   Lymphadenopathy 01/30/2014   Mental disorder    Pancytopenia ([place])    Pancytopenia, acquired ([place]) 07/16/2015   Polysubstance abuse ([place])    Prior cocaine   Pulmonary HTN ([place])    Echo (9/11) with EF 65%, mild LVH, mild AI, mild MR, moderate to possibly severe TR with PA systolic pressure 52 mmHg. Echo (10/12): severe LV hypertrophy, EF 60-65%, mild MR, mild AI, moderate to severe tricuspid regurgitation, PA systolic pressure 38 mmHg.  Echo 4/14: moderate LVH, EF 65%, normal wall motion, diastolic dysfunction, mild AI, mild MR   Shortness of breath    a. PFTs 5/14:  FEV1/FVC 99% predicted; FVC 60% predicted; DLCO mildly reduced, mild restriction, air trapping.;  b.  seen by pulmo (Dr. [name]) 5/14: mainly upper airway symptoms - ACE d/c'd (sx's better)   Stroke [facility]) 2010ish   Syphilis 04/25/2013   Tobacco abuse    Tricuspid regurgitation    Unspecified deficiency anemia 03/29/2013    Past Surgical History:  Procedure Laterality Date   ANKLE SURGERY     right   BONE MARROW ASPIRATION     CARDIAC CATHETERIZATION     CARDIAC CATHETERIZATION N/A 04/23/2015   Procedure: Right Heart Cath;  Surgeon: [name], MD;  Location: [place] CV LAB;  Service: Cardiovascular;  Laterality:  N/A;   CARDIOVERSION N/A 03/11/2018   Procedure: CARDIOVERSION;  Surgeon: [name], MD;  Location: [facility] ENDOSCOPY;  Service: Cardiovascular;  Laterality: N/A;   I & D EXTREMITY  08/09/2011   Procedure: IRRIGATION AND DEBRIDEMENT EXTREMITY;  Surgeon: [name], MD;  Location: [place];  Service: General;  Laterality: Left;  i & D Left axilla abscess   LYMPH NODE BIOPSY N/A 04/05/2013   Procedure: EXCISIONAL BIOPSY RIGHT SUBMANDIBULAR NODE, NASAL ENDOSCOPIC WITH BIOPSY NASAL PHARYNX;  Surgeon: [name], MD;  Location: [place];  Service: ENT;  Laterality: N/A;   MULTIPLE EXTRACTIONS WITH ALVEOLOPLASTY N/A 03/03/2019   Procedure: MULTIPLE EXTRACTION WITH ALVEOLOPLASTY;  Surgeon: [name], DDS;  Location: [place];  Service: Oral Surgery;  Laterality: N/A;   TEE WITHOUT CARDIOVERSION N/A 03/11/2018   Procedure: TRANSESOPHAGEAL ECHOCARDIOGRAM (TEE);  Surgeon: [name], MD;  Location: [place];  Service: Cardiovascular;  Laterality: N/A;  TEE WITHOUT CARDIOVERSION N/A 12/27/2020   Procedure: TRANSESOPHAGEAL ECHOCARDIOGRAM (TEE);  Surgeon: [name], DO;  Location: [facility] ENDOSCOPY;  Service: Cardiovascular;  Laterality: N/A;     Current Outpatient Medications  Medication Sig Dispense Refill   albuterol (VENTOLIN HFA) 108 (90 Base) MCG/ACT inhaler Inhale 2 puffs into the lungs every 6 (six) hours as needed for wheezing or shortness of breath. 18 g 2   atorvastatin (LIPITOR) 40 MG tablet Take 1 tablet (40 mg total) by mouth at bedtime. 30 tablet 11   Cholecalciferol (VITAMIN D) 50 MCG (2000 UT) CAPS Take 2,000 Units by mouth daily.     diltiazem (CARDIZEM CD) 360 MG 24 hr capsule Take 1 capsule (360 mg total) by mouth daily. 90 capsule 1   ferrous sulfate 325 (65 FE) MG tablet Take 325 mg by mouth daily with breakfast.     fluticasone (FLONASE) 50 MCG/ACT nasal spray Place 2 sprays into both nostrils daily. 16 g 11   furosemide (LASIX) 40 MG tablet Take 1 tablet (40 mg total) by mouth  daily. 90 tablet 3   hydrOXYzine (ATARAX) 50 MG tablet Take 1 tablet (50 mg total) by mouth every 8 (eight) hours as needed. 90 tablet 3   lisinopril (ZESTRIL) 5 MG tablet Take 1 tablet (5 mg total) by mouth daily. 30 tablet 5   nicotine (NICODERM CQ - DOSED IN MG/24 HR) 7 mg/24hr patch Place 1 patch (7 mg total) onto the skin daily. 28 patch 0   omeprazole (PRILOSEC) 40 MG capsule Take 1 capsule (40 mg total) by mouth daily. Take with water, wait 30-60 minutes after taking before eating or drinking anything but water 30 capsule 2   oxyCODONE-acetaminophen (PERCOCET/ROXICET) 5-325 MG tablet Take 1-2 tablets by mouth every 8 (eight) hours as needed for severe pain. 6 tablet 0   potassium chloride SA (KLOR-CON M) 20 MEQ tablet Take 1 tablet (20 mEq total) by mouth daily. 90 tablet 2   Rivaroxaban (XARELTO) 15 MG TABS tablet Take 1 tablet (15 mg total) by mouth daily with supper. 30 tablet 5   spironolactone (ALDACTONE) 25 MG tablet Take 1 tablet (25 mg total) by mouth once daily. 90 tablet 1   No current facility-administered medications for this visit.    Allergies:   Ciprofloxacin    Social History:  The patient  reports that she has quit smoking. Her smoking use included cigarettes. She has a 19.50 pack-year smoking history. She has never used smokeless tobacco. She reports that she does not currently use alcohol. She reports current drug use. Frequency: 7.00 times per week. Drug: "Crack" cocaine.   Family History:  The patient's family history includes Asthma in her grandson; Breast cancer in her maternal grandmother; Coronary artery disease in her father and mother; Diabetes in her father, maternal grandmother, and mother; Heart attack in her father and maternal grandmother; Hypertension in her father; Stroke in her maternal grandmother.    ROS:  Please see the history of present illness.     All other systems are reviewed and negative.   PHYSICAL EXAM: VS:  LMP 04/24/2013 Comment:  states periods come and go , BMI There is no height or weight on file to calculate BMI. GENERAL:  Well appearing HEENT: Pupils equal round and reactive, fundi not visualized, oral mucosa unremarkable.  S/p extraction of several teeth NECK:  No jugular venous distention, waveform within normal limits, carotid upstroke brisk and symmetric, no bruits, no thyromegaly LYMPHATICS:  No cervical adenopathy LUNGS:  Clear to auscultation bilaterally HEART:  RRR.  PMI not displaced or sustained,S1 and S2 within normal limits, no S3, no S4, no clicks, no rubs, II/VI systolic murmur at apex ABD:  Flat, positive bowel sounds normal in frequency in pitch, no bruits, no rebound, no guarding, no midline pulsatile mass, no hepatomegaly, no splenomegaly EXT:  2 plus pulses throughout, no edema, no cyanosis no clubbing SKIN:  No rashes no nodules NEURO:  Cranial nerves II through XII grossly intact, motor grossly intact throughout PSYCH:  Cognitively intact, oriented to person place and time   EKG:  The EKG is personally reviewed 04/28/2022: 05/23/18: Atrial flutter.  Variable A-V block.  Rate 82 bpm.  Echo 03/09/18: Study Conclusions   - Left ventricle: The cavity size was normal. Wall thickness was   normal. Systolic function was normal. The estimated ejection   fraction was in the range of 50% to 55%. The study is not   technically sufficient to allow evaluation of LV diastolic   function. - Aortic valve: There was mild regurgitation. - Mitral valve: There was moderate to severe regurgitation. - Left atrium: The atrium was severely dilated. - Atrial septum: No defect or patent foramen ovale was identified. - Tricuspid valve: There was moderate regurgitation. - Pulmonary arteries: PA peak pressure: 39 mm Hg (S).  TEE 03/11/18: Study Conclusions   - Left ventricle: LVEF is depressed. - Mitral valve: MV is mildly thickened. Leaflets do not appear to   coapt well Annular distance is 39 mm There is  moderate to severe   MR directed toward back of LA> - Left atrium: No evidence of thrombus in the atrial cavity or   appendage.   Impressions:   - Successful cardioversion. No cardiac source of emboli was   indentified.  Echo 04/14/21:  IMPRESSIONS     1. Left ventricular ejection fraction, by estimation, is 60 to 65%. The  left ventricle has normal function. The left ventricle has no regional  wall motion abnormalities. Left ventricular diastolic parameters were  normal.   2. Right ventricular systolic function is normal. The right ventricular  size is normal.   3. Left atrial size was moderately dilated.   4. Right atrial size was moderately dilated.   5. The mitral valve is abnormal. Mild mitral valve regurgitation. No  evidence of mitral stenosis.   6. The aortic valve is tricuspid. There is mild calcification of the  aortic valve. Aortic valve regurgitation is moderate. Aortic valve  sclerosis is present, with no evidence of aortic valve stenosis.   7. The inferior vena cava is normal in size with greater than 50%  respiratory variability, suggesting right atrial pressure of 3 mmHg.    Recent Labs: 12/23/2021: ALT 22; Magnesium 2.4; TSH 1.590 03/16/2022: BUN 33; Creatinine, Ser 1.39; Hemoglobin 12.0; Platelets 212; Potassium 4.1; Sodium 142    Lipid Panel    Component Value Date/Time   CHOL 152 12/26/2020 0232   TRIG 51 12/26/2020 0232   HDL 48 12/26/2020 0232   CHOLHDL 3.2 12/26/2020 0232   VLDL 10 12/26/2020 0232   LDLCALC 94 12/26/2020 0232   LDLDIRECT 142.1 12/25/2010 0846      Wt Readings from Last 3 Encounters:  03/16/22 202 lb (91.6 kg)  02/27/22 202 lb (91.6 kg)  12/23/21 197 lb (89.4 kg)      ASSESSMENT AND PLAN:  # Persistent atrial flutter:  Morgan Bean previously underwent ablation and had a DCCV 02/2018.  Today she is back in  sinus rhythm and doing well.  She is tolerating amiodarone.  Continue diltiazem, amiodarone, and Xarelto.    #  Moderate to severe MR:  She is euvolemic.  Continue furosemide.  Initially she did not undergo surgery due to drug use.  She is doing well at this time.  We will repeat her echocardiogram to evaluate the severity of her mitral regurgitation.  If she has severe mitral vegetation and her history of atrial fibrillation would make her a candidate for replacement/repair.  She has been abstaining from illicit drugs.  Consider referral to CT surgery based on the echo results.   # Hypertension:  Her blood pressure was initially elevated and better on repeat.  Continue clonidine, diltiazem, and lisinopril.  # Chronic diastolic heart failure: She is euvolemic.  Continue BP control and lasix as above.  # CAD: Non-obstructive on cath in 2012.  No ischemia on stress 11/2015.  # Hyperlipidemia:  Continue atorvastatin.  # Prior stroke: Aspirin discontinued when she started Xarelto.  # Polysubstance abuse: Congratulated her on abstaining from cocaine.  She is interested in smoking cessation.  She would like to try nicotine patches.  She will start with 21 mg daily for 6 weeks followed by 14 mg for 2 weeks and 7 mg for 2 weeks.  Current medicines are reviewed at length with the patient today.  The patient does not have concerns regarding medicines.  The following changes have been made: Start nicotine patches  Labs/ tests ordered today include:   No orders of the defined types were placed in this encounter.   Disposition:   FU with Morgan C. Oval Linsey, MD, [facility] in *** months.    Plan:    I,Coren O'Brien,acting as a scribe for [name], MD.,have documented all relevant documentation on the behalf of [name], MD,as directed by  [name], MD while in the presence of [name], MD.  ***

## 2022-05-06 ENCOUNTER — Ambulatory Visit (HOSPITAL_COMMUNITY): Payer: Medicaid Other | Admitting: Physician Assistant

## 2022-05-07 ENCOUNTER — Ambulatory Visit: Payer: Medicaid Other | Attending: Family Medicine | Admitting: Family Medicine

## 2022-05-07 ENCOUNTER — Encounter: Payer: Self-pay | Admitting: Family Medicine

## 2022-05-07 VITALS — BP 123/92 | HR 117 | Temp 98.0°F | Ht 66.0 in | Wt 203.0 lb

## 2022-05-07 DIAGNOSIS — I480 Paroxysmal atrial fibrillation: Secondary | ICD-10-CM | POA: Diagnosis not present

## 2022-05-07 DIAGNOSIS — I2510 Atherosclerotic heart disease of native coronary artery without angina pectoris: Secondary | ICD-10-CM | POA: Diagnosis not present

## 2022-05-07 DIAGNOSIS — F1410 Cocaine abuse, uncomplicated: Secondary | ICD-10-CM | POA: Insufficient documentation

## 2022-05-07 DIAGNOSIS — I5042 Chronic combined systolic (congestive) and diastolic (congestive) heart failure: Secondary | ICD-10-CM | POA: Diagnosis not present

## 2022-05-07 DIAGNOSIS — F17210 Nicotine dependence, cigarettes, uncomplicated: Secondary | ICD-10-CM | POA: Diagnosis not present

## 2022-05-07 DIAGNOSIS — J4489 Other specified chronic obstructive pulmonary disease: Secondary | ICD-10-CM | POA: Insufficient documentation

## 2022-05-07 DIAGNOSIS — Z9889 Other specified postprocedural states: Secondary | ICD-10-CM | POA: Insufficient documentation

## 2022-05-07 DIAGNOSIS — F17200 Nicotine dependence, unspecified, uncomplicated: Secondary | ICD-10-CM | POA: Diagnosis not present

## 2022-05-07 DIAGNOSIS — Z23 Encounter for immunization: Secondary | ICD-10-CM

## 2022-05-07 DIAGNOSIS — F339 Major depressive disorder, recurrent, unspecified: Secondary | ICD-10-CM | POA: Diagnosis not present

## 2022-05-07 DIAGNOSIS — Z8673 Personal history of transient ischemic attack (TIA), and cerebral infarction without residual deficits: Secondary | ICD-10-CM | POA: Diagnosis not present

## 2022-05-07 DIAGNOSIS — N1831 Chronic kidney disease, stage 3a: Secondary | ICD-10-CM | POA: Insufficient documentation

## 2022-05-07 DIAGNOSIS — J449 Chronic obstructive pulmonary disease, unspecified: Secondary | ICD-10-CM | POA: Diagnosis not present

## 2022-05-07 DIAGNOSIS — Z1211 Encounter for screening for malignant neoplasm of colon: Secondary | ICD-10-CM

## 2022-05-07 DIAGNOSIS — I130 Hypertensive heart and chronic kidney disease with heart failure and stage 1 through stage 4 chronic kidney disease, or unspecified chronic kidney disease: Secondary | ICD-10-CM | POA: Insufficient documentation

## 2022-05-07 DIAGNOSIS — I4891 Unspecified atrial fibrillation: Secondary | ICD-10-CM

## 2022-05-07 DIAGNOSIS — I110 Hypertensive heart disease with heart failure: Secondary | ICD-10-CM

## 2022-05-07 NOTE — Progress Notes (Signed)
Subjective:  Patient ID: Morgan Bean, female    DOB: [birth year]  Age: 57 y.o. MRN: [id number]  CC: Hypertension   HPI Morgan Bean is a 57 y.o. year old female with a history of Hypertension, paroxysmal Afib, A-flutter (status post ablation) CAD, previous CVA, moderate to severe MR, HFpEF, depression, polysubstance abuse.   Interval History:  Seen by the cardiology nurse practitioner in 02/2022 and lisinopril dose was decreased to 5 mg due to hypotension.  She remains on Xarelto.  Denies presence of dyspnea, chest pain, pedal edema.  Endorses adherence with her antihypertensives. She is tachycardic and she attributes it 'going through a lot of stuff'. Still uses cocaine.  Her back hurts intermittently and she is under the care of [facility]. She also has bilateral carpal tunnel syndrome but pain is absent at the moment. Her COPD has not flared up. She is using nicotine patch to assist is smoking cessation. Smoked for > 30 years about 2 packs/day down to 4-5 Cigarettes / day. She is on hydroxyzine for her anxiety but has an upcoming appointment with [facility] behavioral health for management of depression in 2 weeks. Past Medical History:  Diagnosis Date   Anemia of other chronic disease 11/13/2013   Anginal pain ([place])    none in past year   Anxiety    Asthma    patient denies   Autoimmune disease ([place]) 12/27/2018   Candidiasis 06/23/2013   Chronic diastolic CHF (congestive heart failure) ([place])    Constipation    Coronary artery disease    Lexiscan myoview (10/12) with significant ST depression upon Lexiscan injection but no ischemia or infarction on perfusion images. Left heart cath (10/12): 30% mid LAD, 30% ostial D1, 50% ostial D2.     Cough 11/13/2013   Depression    Headache 02/22/2014   Hx of cardiovascular stress test    Lexiscan Myoview (2/16): Breast attenuation artifact, no ischemia, EF 64%; low risk   Hyperlipemia    Hypertension    Iron  deficiency    hx of   Leukocytoclastic vasculitis ([name])    Rash across lower body, occurred in 9/11, diagnosed by skin biopsy. ANA positive. Thought to be secondary to cocaine use.   Leukopenia 03/21/2013   Lymphadenopathy 03/21/2013   Lymphadenopathy 01/30/2014   Mental disorder    Pancytopenia ([place])    Pancytopenia, acquired ([place]) 07/16/2015   Polysubstance abuse ([place])    Prior cocaine   Pulmonary HTN ([place])    Echo (9/11) with EF 65%, mild LVH, mild AI, mild MR, moderate to possibly severe TR with PA systolic pressure 52 mmHg. Echo (10/12): severe LV hypertrophy, EF 60-65%, mild MR, mild AI, moderate to severe tricuspid regurgitation, PA systolic pressure 38 mmHg.  Echo 4/14: moderate LVH, EF 65%, normal wall motion, diastolic dysfunction, mild AI, mild MR   Shortness of breath    a. PFTs 5/14:  FEV1/FVC 99% predicted; FVC 60% predicted; DLCO mildly reduced, mild restriction, air trapping.;  b.  seen by pulmo (Dr. [name]) 5/14: mainly upper airway symptoms - ACE d/c'd (sx's better)   Stroke [facility]) 2010ish   Syphilis 04/25/2013   Tobacco abuse    Tricuspid regurgitation    Unspecified deficiency anemia 03/29/2013    Past Surgical History:  Procedure Laterality Date   ANKLE SURGERY     right   BONE MARROW ASPIRATION     CARDIAC CATHETERIZATION     CARDIAC CATHETERIZATION N/A 04/23/2015   Procedure: Right Heart Cath;  Surgeon: [name], MD;  Location: [facility];  Service: Cardiovascular;  Laterality: N/A;   CARDIOVERSION N/A 03/11/2018   Procedure: CARDIOVERSION;  Surgeon: [name], MD;  Location: [facility];  Service: Cardiovascular;  Laterality: N/A;   I & D EXTREMITY  08/09/2011   Procedure: IRRIGATION AND DEBRIDEMENT EXTREMITY;  Surgeon: [name], MD;  Location: [facility];  Service: General;  Laterality: Left;  i & D Left axilla abscess   LYMPH NODE BIOPSY N/A 04/05/2013   Procedure: EXCISIONAL BIOPSY RIGHT SUBMANDIBULAR NODE, NASAL ENDOSCOPIC WITH BIOPSY  NASAL PHARYNX;  Surgeon: [name], MD;  Location: [facility];  Service: ENT;  Laterality: N/A;   MULTIPLE EXTRACTIONS WITH ALVEOLOPLASTY N/A 03/03/2019   Procedure: MULTIPLE EXTRACTION WITH ALVEOLOPLASTY;  Surgeon: [name], DDS;  Location: [facility];  Service: Oral Surgery;  Laterality: N/A;   TEE WITHOUT CARDIOVERSION N/A 03/11/2018   Procedure: TRANSESOPHAGEAL ECHOCARDIOGRAM (TEE);  Surgeon: [name], MD;  Location: [facility];  Service: Cardiovascular;  Laterality: N/A;   TEE WITHOUT CARDIOVERSION N/A 12/27/2020   Procedure: TRANSESOPHAGEAL ECHOCARDIOGRAM (TEE);  Surgeon: [name], DO;  [facility];  Service: Cardiovascular;  Laterality: N/A;    Family History  Problem Relation Age of Onset   Coronary artery disease Mother    Diabetes Mother    Diabetes Father    Hypertension Father    Coronary artery disease Father    Heart attack Father    Breast cancer Maternal Grandmother    Diabetes Maternal Grandmother    Heart attack Maternal Grandmother    Stroke Maternal Grandmother    Asthma Grandson     Social History   Socioeconomic History   Marital status: Single    Spouse name: Not on file   Number of children: 2   Years of education: Not on file   Highest education level: GED or equivalent  Occupational History   Occupation: unemployed  Tobacco Use   Smoking status: Former    Packs/day: 0.50    Years: 39.00    Total pack years: 19.50    Types: Cigarettes   Smokeless tobacco: Never  Vaping Use   Vaping Use: Never used  Substance and Sexual Activity   Alcohol use: Not Currently    Comment: used in the past   Drug use: Yes    Frequency: 7.0 times per week    Types: "Crack" cocaine   Sexual activity: Not Currently  Other Topics Concern   Not on file  Social History Narrative   Lives with mom, sisters and brothers.   Social Determinants of Health   Financial Resource Strain: Medium Risk (12/25/2020)   Overall Financial Resource Strain  (CARDIA)    Difficulty of Paying Living Expenses: Somewhat hard  Food Insecurity: No Food Insecurity (12/25/2020)   Hunger Vital Sign    Worried About Running Out of Food in the Last Year: Never true    Ran Out of Food in the Last Year: Never true  Transportation Needs: Unmet Transportation Needs (12/25/2020)   PRAPARE - Hydrologist (Medical): Yes    Lack of Transportation (Non-Medical): Yes  Physical Activity: Not on file  Stress: Not on file  Social Connections: Not on file    Allergies  Allergen Reactions   Ciprofloxacin Itching    Outpatient Medications Prior to Visit  Medication Sig Dispense Refill   albuterol (VENTOLIN HFA) 108 (90 Base) MCG/ACT inhaler Inhale 2 puffs into the lungs  every 6 (six) hours as needed for wheezing or shortness of breath. 18 g 2   atorvastatin (LIPITOR) 40 MG tablet Take 1 tablet (40 mg total) by mouth at bedtime. 30 tablet 11   Cholecalciferol (VITAMIN D) 50 MCG (2000 UT) CAPS Take 2,000 Units by mouth daily.     diltiazem (CARDIZEM CD) 360 MG 24 hr capsule Take 1 capsule (360 mg total) by mouth daily. 90 capsule 1   ferrous sulfate 325 (65 FE) MG tablet Take 325 mg by mouth daily with breakfast.     fluticasone (FLONASE) 50 MCG/ACT nasal spray Place 2 sprays into both nostrils daily. 16 g 11   furosemide (LASIX) 40 MG tablet Take 1 tablet (40 mg total) by mouth daily. 90 tablet 3   hydrOXYzine (ATARAX) 50 MG tablet Take 1 tablet (50 mg total) by mouth every 8 (eight) hours as needed. 90 tablet 3   lisinopril (ZESTRIL) 5 MG tablet Take 1 tablet (5 mg total) by mouth daily. 30 tablet 5   nicotine (NICODERM CQ - DOSED IN MG/24 HR) 7 mg/24hr patch Place 1 patch (7 mg total) onto the skin daily. 28 patch 0   omeprazole (PRILOSEC) 40 MG capsule Take 1 capsule (40 mg total) by mouth daily. Take with water, wait 30-60 minutes after taking before eating or drinking anything but water 30 capsule 2   potassium chloride SA (KLOR-CON  M) 20 MEQ tablet Take 1 tablet (20 mEq total) by mouth daily. 90 tablet 2   Rivaroxaban (XARELTO) 15 MG TABS tablet Take 1 tablet (15 mg total) by mouth daily with supper. 30 tablet 5   spironolactone (ALDACTONE) 25 MG tablet Take 1 tablet (25 mg total) by mouth once daily. 90 tablet 1   oxyCODONE-acetaminophen (PERCOCET/ROXICET) 5-325 MG tablet Take 1-2 tablets by mouth every 8 (eight) hours as needed for severe pain. (Patient not taking: Reported on 05/07/2022) 6 tablet 0   No facility-administered medications prior to visit.     ROS Review of Systems  Constitutional:  Negative for activity change and appetite change.  HENT:  Negative for sinus pressure and sore throat.   Respiratory:  Negative for chest tightness, shortness of breath and wheezing.   Cardiovascular:  Negative for chest pain and palpitations.  Gastrointestinal:  Negative for abdominal distention, abdominal pain and constipation.  Genitourinary: Negative.   Musculoskeletal: Negative.   Psychiatric/Behavioral:  Negative for behavioral problems and dysphoric mood.     Objective:  BP (!) 123/92   Pulse (!) 117   Temp 98 F (36.7 C) (Oral)   Ht 5' 6"$  (1.676 m)   Wt 203 lb (92.1 kg)   LMP 04/24/2013 Comment: states periods come and go  SpO2 98%   BMI 32.77 kg/m      05/07/2022   10:43 AM 04/22/2022    1:55 PM 03/16/2022    3:30 PM  BP/Weight  Systolic BP [id number]  0000000  Diastolic BP 92  0000000  Wt. (Lbs) 203    BMI 32.77 kg/m2       Information is confidential and restricted. Go to Review Flowsheets to unlock data.      Physical Exam Constitutional:      Appearance: She is well-developed.  Cardiovascular:     Rate and Rhythm: Tachycardia present.     Heart sounds: Normal heart sounds. No murmur heard. Pulmonary:     Effort: Pulmonary effort is normal.     Breath sounds: Normal breath sounds. No wheezing or rales.  Chest:  Chest wall: No tenderness.  Abdominal:     General: Bowel sounds are normal. There  is no distension.     Palpations: Abdomen is soft. There is no mass.     Tenderness: There is no abdominal tenderness.  Musculoskeletal:        General: Normal range of motion.     Right lower leg: No edema.     Left lower leg: No edema.  Neurological:     Mental Status: She is alert and oriented to person, place, and time.  Psychiatric:        Mood and Affect: Mood normal.        Latest Ref Rng & Units 03/16/2022   10:54 AM 12/23/2021    8:46 AM 11/06/2021    2:26 PM  CMP  Glucose 70 - 99 mg/dL 97  99  85   BUN 6 - 20 mg/dL 33  44  30   Creatinine 0.44 - 1.00 mg/dL 1.39  1.65  2.01   Sodium 135 - 145 mmol/L 142  139  138   Potassium 3.5 - 5.1 mmol/L 4.1  4.8  4.7   Chloride 98 - 111 mmol/L 112  104  103   CO2 22 - 32 mmol/L 22  19  22   $ Calcium 8.9 - 10.3 mg/dL 8.9  9.7  9.5   Total Protein 6.0 - 8.5 g/dL  7.9    Total Bilirubin 0.0 - 1.2 mg/dL  0.2    Alkaline Phos 44 - 121 IU/L  113    AST 0 - 40 IU/L  20    ALT 0 - 32 IU/L  22      Lipid Panel     Component Value Date/Time   CHOL 152 12/26/2020 0232   TRIG 51 12/26/2020 0232   HDL 48 12/26/2020 0232   CHOLHDL 3.2 12/26/2020 0232   VLDL 10 12/26/2020 0232   LDLCALC 94 12/26/2020 0232   LDLDIRECT 142.1 12/25/2010 0846    CBC    Component Value Date/Time   WBC 5.9 03/16/2022 1054   RBC 4.50 03/16/2022 1054   HGB 12.0 03/16/2022 1054   HGB 13.8 12/23/2021 0846   HGB 10.0 (L) 07/16/2015 1407   HCT 38.0 03/16/2022 1054   HCT 41.9 12/23/2021 0846   HCT 31.8 (L) 07/16/2015 1407   PLT 212 03/16/2022 1054   PLT 210 12/23/2021 0846   MCV 84.4 03/16/2022 1054   MCV 80 12/23/2021 0846   MCV 78.2 (L) 07/16/2015 1407   MCH 26.7 03/16/2022 1054   MCHC 31.6 03/16/2022 1054   RDW 15.1 03/16/2022 1054   RDW 14.6 12/23/2021 0846   RDW 14.9 (H) 07/16/2015 1407   LYMPHSABS 2.5 05/08/2021 1120   LYMPHSABS 1.8 07/16/2015 1407   MONOABS 0.4 04/17/2021 0428   MONOABS 0.8 07/16/2015 1407   EOSABS 0.3 05/08/2021 1120    BASOSABS 0.1 05/08/2021 1120   BASOSABS 0.0 07/16/2015 1407    Lab Results  Component Value Date   HGBA1C 5.2 12/26/2020    Assessment & Plan:  1. Chronic obstructive pulmonary disease, unspecified COPD type ([place]) Stable with no exacerbation She uses albuterol MDI as needed No indication for an additional inhaler Advised that smoking cessation will be beneficial  2. Cocaine abuse ([place]) Ongoing use Strongly advised to quit  3. Hypertensive heart disease with chronic combined systolic and diastolic congestive heart failure ([place]) Euvolemic with EF of 60 to 65% from echo of 03/2021 She does have slight diastolic  elevation and Lisinopril dose was decreased at her last visit with the cardiology NP Not on a beta-blocker due to cocaine use Continue medications and follow-up with cardiology  4. Atrial fibrillation with rapid ventricular response ([name]) She is currently tachycardic and is attributing this to stress, rhythm is regular Continue rate control with Cardizem and anticoagulation with Xarelto  5. Stage 3a chronic kidney disease ([place]) Likely hypertensive nephropathy Last creatinine from 6 weeks ago was 1.39 Avoid nephrotoxins  6. Screening for colon cancer - Ambulatory referral to Gastroenterology  7. Smoking greater than 20 pack years Counseled on smoking cessation She is currently using nicotine patches - CT CHEST LUNG CANCER SCREENING LOW DOSE WO CONTRAST; Future  8. Depression, recurrent ([place]) Elevated PHQ-9 score Currently on hydroxyzine for anxiety Missed recent appointment with behavioral health and is scheduled to see them in 2 weeks.      No orders of the defined types were placed in this encounter.   Follow-up: Return in about 6 months (around 11/05/2022) for Chronic medical conditions.       [name], MD, FAAFP. [facility], [place]   05/07/2022, 11:12 AM

## 2022-05-07 NOTE — Patient Instructions (Addendum)
Cocaine Use Disorder Cocaine use disorder is a condition in which your cocaine use disrupts your daily life. Cocaine belongs to a group of powerful drugs known as stimulants. Other names for cocaine include coke, crack, blow, snow, C, powder, and nose candy. This drug has some medical uses, but these are rare. Cocaine is most often misused because of its effects, which include: A feeling of extreme pleasure (euphoria). Alertness. A high energy level. Cocaine use disorder can be dangerous because: Cocaine increases your blood pressure. Using cocaine can lead to a heart attack or stroke. Cocaine use can cause fast or irregular heartbeats and seizures. Non-medical cocaine use is illegal and can lead to criminal charges. Cocaine use can be life-threatening. What are the causes? This condition is caused by misusing cocaine. Many people start using cocaine because it makes them feel good or helps them deal with their lives. Over time, they get addicted to it. When they try to stop using it, they feel sick. What increases the risk? The following factors may make you more likely to develop this condition: Misusing other drugs. A family history of misusing drugs. History of mental illness. What are the signs or symptoms? Symptoms of this condition include: Craving cocaine, which can lead to: Using more cocaine than you want to, or using cocaine for longer than you want to. Being unable to slow down or stop your cocaine use. Needing more and more cocaine to get the same effects (building up a tolerance). Spending a lot of time getting cocaine, using it, or recovering from its effects. Having problems at work, at school, at home, or with relationships because of cocaine use. Giving up or cutting down on important life activities because of cocaine use. Using cocaine when it is dangerous, such as when driving a car. Continuing to use cocaine even though it has led to physical problems, such as: Poor  nutrition. Nosebleeds. Chest pain. Continuing to use cocaine even though it is affecting your mental health. You may develop: Schizophrenia-like symptoms. You may think or act in an unusual way. Depression. Bipolar mood swings. Anxiety. Sleep problems. If you stop using cocaine, you may have symptoms of withdrawal, such as: Depression. Bad dreams or sleep problems. Tiredness (fatigue) or slowed thinking. Increased appetite. How is this diagnosed? This condition is diagnosed based on: A physical exam. Your history of cocaine use. Your symptoms. This includes: How cocaine use affects your life. Changes in personality, behaviors, and mood. Health or mental health issues related to using cocaine. Blood or urine tests to screen for drugs. How is this treated? The first goal of treatment is to stop your use of cocaine. This must be done safely and may involve: Taking part in group and individual counseling from specially trained mental health providers. Staying at a residential treatment center for several days or weeks. Attending daily counseling sessions at a treatment center. Taking medicines as told by your health care provider that: Ease symptoms and prevent complications during withdrawal. Block cravings and block the good feeling that you get from using cocaine. Treat other mental health issues, such as depression or anxiety. Participating in a support group to share your experience with others who are going through the same thing. Recovery can be a long process. Many people who undergo treatment start using the drug again after stopping (relapse). If you relapse, it does not mean that treatment will not work. Follow these instructions at home: Medicines Take over-the-counter and prescription medicines only as told by your  health care provider. Check with your health care provider before starting any new medicines, vitamins, herbs, or supplements. General instructions      Do not use any drugs or alcohol. Do not use any products that contain nicotine or tobacco. These products include cigarettes, chewing tobacco, and vaping devices, such as e-cigarettes. If you need help quitting, ask your health care provider. Avoid people and activities that trigger your use of cocaine. Learn and practice techniques for managing stress. Have a plan for vulnerable moments. Get phone numbers of those who are willing to help and who are committed to your recovery. Attend support groups regularly for emotional support, advice, and guidance. Keep all follow-up visits. This is important for recovery and includes continuing to work with therapists and support groups. Where to find more information [employer] on Drug Abuse: [place] Substance Abuse and Mental Health Services Administration: SamedayNews.com.cy Narcotics Anonymous: https://torres-moran.org/ Contact a health care provider if: Your symptoms get worse. You use cocaine again. You cannot take your medicines as told. Get help right away if you have: Chest pain. Any symptoms of a stroke. "BE FAST" is an easy way to remember the main warning signs of a stroke: B - Balance. Signs are dizziness, sudden trouble walking, or loss of balance. E - Eyes. Signs are trouble seeing or a sudden change in vision. F - Face. Signs are sudden weakness or numbness of the face, or the face or eyelid drooping on one side. A - Arms. Signs are weakness or numbness in an arm. This happens suddenly and usually on one side of the body. S - Speech. Signs are sudden trouble speaking, slurred speech, or trouble understanding what people say. T - Time. Time to call emergency services. Write down what time symptoms started. Other signs of a stroke, such as: A sudden, severe headache with no known cause. Nausea or vomiting. Seizure. These symptoms may be an emergency. Get help right away. Call 911. Do not wait to see if the symptoms will go away. Do not drive  yourself to the hospital. Also, get help right away if: You have serious thoughts about hurting yourself or others. Take one of these steps if you feel like you may hurt yourself or others, or have thoughts about taking your own life: Go to your nearest emergency room. Call 911. Call the [place] at [phone] or [phone]. This is open 24 hours a day. Text the Crisis Text Line at 763 477 9919. Summary Cocaine has some medical uses, but these are rare. Cocaine is most often misused because of its effects. Many people start using cocaine because it makes them feel good or helps them deal with their lives. Over time, they get addicted to it. Treatment for this condition is usually provided by specially trained mental health providers. This information is not intended to replace advice given to you by your health care provider. Make sure you discuss any questions you have with your health care provider. Document Revised: 07/07/2021 Document Reviewed: 07/07/2021 Elsevier Patient Education  [place].

## 2022-05-25 ENCOUNTER — Ambulatory Visit (HOSPITAL_COMMUNITY): Payer: Medicaid Other | Admitting: Clinical

## 2022-05-29 ENCOUNTER — Other Ambulatory Visit: Payer: Self-pay

## 2022-05-29 ENCOUNTER — Other Ambulatory Visit (HOSPITAL_BASED_OUTPATIENT_CLINIC_OR_DEPARTMENT_OTHER): Payer: Self-pay | Admitting: Family

## 2022-05-29 DIAGNOSIS — I4819 Other persistent atrial fibrillation: Secondary | ICD-10-CM

## 2022-05-29 MED ORDER — ATORVASTATIN CALCIUM 40 MG PO TABS
40.0000 mg | ORAL_TABLET | Freq: Every day | ORAL | 11 refills | Status: DC
  Filled 2022-05-29: qty 30, 30d supply, fill #0
  Filled 2022-08-20: qty 30, 30d supply, fill #1
  Filled 2022-09-18 – 2022-10-26 (×3): qty 30, 30d supply, fill #2
  Filled 2022-12-08: qty 30, 30d supply, fill #3
  Filled 2023-01-20: qty 30, 30d supply, fill #4
  Filled 2023-02-16: qty 30, 30d supply, fill #5
  Filled 2023-04-23: qty 30, 30d supply, fill #6

## 2022-06-04 ENCOUNTER — Other Ambulatory Visit: Payer: Self-pay

## 2022-07-03 ENCOUNTER — Other Ambulatory Visit: Payer: Self-pay

## 2022-07-08 ENCOUNTER — Encounter: Payer: Self-pay | Admitting: Family Medicine

## 2022-07-15 ENCOUNTER — Telehealth: Payer: Self-pay

## 2022-07-15 NOTE — Progress Notes (Signed)
Patient attempted to be outreached by Lanetta Inch, PharmD Candidate on 07/15/2022 to discuss hypertension. Left voicemail for patient to return our call at their convenience at [phone].   Lanetta Inch, PharmD Candidate   Valeda Malm, Pharm.D. PGY-2 Ambulatory Care Pharmacy Resident

## 2022-08-20 ENCOUNTER — Other Ambulatory Visit: Payer: Self-pay | Admitting: Family Medicine

## 2022-08-20 ENCOUNTER — Other Ambulatory Visit: Payer: Self-pay

## 2022-08-20 DIAGNOSIS — J4489 Other specified chronic obstructive pulmonary disease: Secondary | ICD-10-CM

## 2022-08-21 ENCOUNTER — Other Ambulatory Visit: Payer: Self-pay

## 2022-08-21 ENCOUNTER — Encounter: Payer: Self-pay | Admitting: Pharmacist

## 2022-08-21 MED ORDER — ALBUTEROL SULFATE HFA 108 (90 BASE) MCG/ACT IN AERS
2.0000 | INHALATION_SPRAY | Freq: Four times a day (QID) | RESPIRATORY_TRACT | 2 refills | Status: DC | PRN
  Filled 2022-08-21: qty 18, 25d supply, fill #0
  Filled 2022-10-26: qty 18, 25d supply, fill #1

## 2022-08-21 MED ORDER — VITAMIN D 50 MCG (2000 UT) PO CAPS
2000.0000 [IU] | ORAL_CAPSULE | Freq: Every day | ORAL | 6 refills | Status: DC
  Filled 2022-08-21: qty 30, 30d supply, fill #0
  Filled 2022-09-18: qty 100, 100d supply, fill #0
  Filled 2022-10-02 – 2022-10-26 (×2): qty 30, 30d supply, fill #0
  Filled 2022-12-08: qty 30, 30d supply, fill #1
  Filled 2023-01-20 – 2023-01-21 (×2): qty 30, 30d supply, fill #2
  Filled 2023-02-16 – 2023-02-17 (×2): qty 30, 30d supply, fill #3
  Filled 2023-04-23: qty 30, 30d supply, fill #4
  Filled 2023-06-02 – 2023-07-02 (×2): qty 30, 30d supply, fill #5
  Filled 2023-07-13: qty 30, 30d supply, fill #0

## 2022-08-21 NOTE — Telephone Encounter (Signed)
Requested Prescriptions  Pending Prescriptions Disp Refills   albuterol (VENTOLIN HFA) 108 (90 Base) MCG/ACT inhaler 18 g 2    Sig: Inhale 2 puffs into the lungs every 6 (six) hours as needed for wheezing or shortness of breath.     Pulmonology:  Beta Agonists 2 Failed - 08/20/2022  3:18 PM      Failed - Last BP in normal range    BP Readings from Last 1 Encounters:  05/07/22 (!) 123/92         Failed - Last Heart Rate in normal range    Pulse Readings from Last 1 Encounters:  05/07/22 (!) 117         Passed - Valid encounter within last 12 months    Recent Outpatient Visits           3 months ago Chronic obstructive pulmonary disease, unspecified COPD type ([name])   [facility] [place], Odette Horns, MD   9 months ago Annual physical exam   [facility] Hoy Register, MD   1 year ago Bilateral carpal tunnel syndrome   [facility] Hoy Register, MD   1 year ago COVID-19 virus infection   [facility] [place], [place], [place]   1 year ago Hypertensive heart disease with chronic combined systolic and diastolic congestive heart failure [facility])   [facility] Hoy Register, MD       Future Appointments             In 2 weeks Hoy Register, MD [facility]

## 2022-08-24 ENCOUNTER — Other Ambulatory Visit: Payer: Self-pay

## 2022-08-26 ENCOUNTER — Other Ambulatory Visit: Payer: Self-pay

## 2022-08-28 ENCOUNTER — Encounter: Payer: Self-pay | Admitting: Family Medicine

## 2022-08-31 ENCOUNTER — Other Ambulatory Visit: Payer: Self-pay

## 2022-08-31 ENCOUNTER — Other Ambulatory Visit: Payer: Self-pay | Admitting: Family Medicine

## 2022-08-31 DIAGNOSIS — M545 Low back pain, unspecified: Secondary | ICD-10-CM

## 2022-08-31 MED ORDER — SODIUM BICARBONATE 650 MG PO TABS
650.0000 mg | ORAL_TABLET | Freq: Two times a day (BID) | ORAL | 6 refills | Status: AC
  Filled 2022-08-31: qty 60, 30d supply, fill #0
  Filled 2022-10-02 – 2022-10-26 (×2): qty 60, 30d supply, fill #1
  Filled 2022-12-08: qty 60, 30d supply, fill #2
  Filled 2023-01-20: qty 60, 30d supply, fill #3
  Filled 2023-02-16: qty 60, 30d supply, fill #4
  Filled 2023-04-23: qty 60, 30d supply, fill #5
  Filled 2023-06-02 – 2023-07-02 (×2): qty 60, 30d supply, fill #6
  Filled 2023-07-13: qty 60, 30d supply, fill #0

## 2022-09-07 ENCOUNTER — Ambulatory Visit: Payer: Medicaid Other | Admitting: Family Medicine

## 2022-09-07 ENCOUNTER — Telehealth: Payer: Self-pay | Admitting: *Deleted

## 2022-09-07 NOTE — Telephone Encounter (Signed)
   Pre-operative Risk Assessment    Patient Name: Morgan Bean  DOB: [birth year] MRN: [id number]      Request for Surgical Clearance     Procedure:   L5-S1 ESI  Date of Surgery:  Clearance TBD STAT                                Surgeon:  DR. Lorrine Kin Surgeon's Group or Practice Name:  [facility] Phone number:  [phone] Fax number:  [phone]   Type of Clearance Requested:   - Medical  - Pharmacy:  Hold Apixaban (Eliquis) x 3 DAYS PRIOR  AND RESUME DAY AFTER   Type of Anesthesia:  Not Indicated   Additional requests/questions:    Elpidio Anis   09/07/2022, 5:44 PM

## 2022-09-08 NOTE — Telephone Encounter (Signed)
   Name: Morgan Bean  DOB: Mar 20, 1966  MRN: [id number]  Primary Cardiologist: Chilton Si, MD  Chart reviewed as part of pre-operative protocol coverage. Because of Morgan Bean's past medical history and time since last visit, she will require a follow-up in-office visit in order to better assess preoperative cardiovascular risk.  Pre-op covering staff: - Please schedule appointment and call patient to inform them. If patient already had an upcoming appointment within acceptable timeframe, please add "pre-op clearance" to the appointment notes so provider is aware. - Please contact requesting surgeon's office via preferred method (i.e, phone, fax) to inform them of need for appointment prior to surgery.  This message will also be routed to pharmacy pool for input on holding Eliquis as requested below so that this information is available to the clearing provider at time of patient's appointment.   Joylene Grapes, NP  09/08/2022, 7:38 AM

## 2022-09-08 NOTE — Telephone Encounter (Signed)
Set up pt's in office appt for pre op clearance.Will remove from pool.

## 2022-09-08 NOTE — Telephone Encounter (Signed)
Patient with diagnosis of afib/flutter and LAA thrombus in 2022 on Eliquis for anticoagulation.    Procedure: L5-S1 ESI Date of procedure: TBD stat  CHA2DS2-VASc Score = 6  This indicates a 9.7% annual risk of stroke. The patient's score is based upon: CHF History: 1 HTN History: 1 Diabetes History: 0 Stroke History: 2 Vascular Disease History: 1 Age Score: 0 Gender Score: 1   CVA ~2010  CrCl 34mL/min using adj body weight Platelet count 212K  Pt will require 3 day Eliquis hold prior to spinal injection. Given elevated CHADS2VASc score with prior stroke (although this was back in 2010) and prior LAA thrombus, will forward to MD to confirm if this is acceptable.  **This guidance is not considered finalized until pre-operative APP has relayed final recommendations.**

## 2022-09-09 ENCOUNTER — Telehealth: Payer: Self-pay

## 2022-09-09 NOTE — Telephone Encounter (Signed)
Patient attempted to be outreached by Alesia Banda on 09/09/22 to discuss hypertension. Unable to leave voicemail for patient to return our call due to VM not set up by patient.  Alesia Banda, Student-PharmD

## 2022-09-10 ENCOUNTER — Ambulatory Visit: Payer: Medicaid Other | Attending: Family Medicine | Admitting: Family Medicine

## 2022-09-10 ENCOUNTER — Other Ambulatory Visit: Payer: Self-pay

## 2022-09-10 ENCOUNTER — Encounter: Payer: Self-pay | Admitting: Family Medicine

## 2022-09-10 VITALS — BP 114/85 | HR 106 | Temp 98.0°F | Ht 66.0 in | Wt 194.6 lb

## 2022-09-10 DIAGNOSIS — Z8673 Personal history of transient ischemic attack (TIA), and cerebral infarction without residual deficits: Secondary | ICD-10-CM | POA: Diagnosis not present

## 2022-09-10 DIAGNOSIS — Z833 Family history of diabetes mellitus: Secondary | ICD-10-CM | POA: Insufficient documentation

## 2022-09-10 DIAGNOSIS — Z7901 Long term (current) use of anticoagulants: Secondary | ICD-10-CM | POA: Insufficient documentation

## 2022-09-10 DIAGNOSIS — M545 Low back pain, unspecified: Secondary | ICD-10-CM

## 2022-09-10 DIAGNOSIS — Z8249 Family history of ischemic heart disease and other diseases of the circulatory system: Secondary | ICD-10-CM | POA: Diagnosis not present

## 2022-09-10 DIAGNOSIS — I480 Paroxysmal atrial fibrillation: Secondary | ICD-10-CM | POA: Insufficient documentation

## 2022-09-10 DIAGNOSIS — I2510 Atherosclerotic heart disease of native coronary artery without angina pectoris: Secondary | ICD-10-CM | POA: Diagnosis not present

## 2022-09-10 DIAGNOSIS — J439 Emphysema, unspecified: Secondary | ICD-10-CM | POA: Diagnosis not present

## 2022-09-10 DIAGNOSIS — R0609 Other forms of dyspnea: Secondary | ICD-10-CM | POA: Insufficient documentation

## 2022-09-10 DIAGNOSIS — R000 Tachycardia, unspecified: Secondary | ICD-10-CM | POA: Insufficient documentation

## 2022-09-10 DIAGNOSIS — I10 Essential (primary) hypertension: Secondary | ICD-10-CM | POA: Diagnosis present

## 2022-09-10 DIAGNOSIS — Z5982 Transportation insecurity: Secondary | ICD-10-CM | POA: Diagnosis not present

## 2022-09-10 DIAGNOSIS — I130 Hypertensive heart and chronic kidney disease with heart failure and stage 1 through stage 4 chronic kidney disease, or unspecified chronic kidney disease: Secondary | ICD-10-CM | POA: Diagnosis not present

## 2022-09-10 DIAGNOSIS — I5042 Chronic combined systolic (congestive) and diastolic (congestive) heart failure: Secondary | ICD-10-CM | POA: Diagnosis not present

## 2022-09-10 DIAGNOSIS — N189 Chronic kidney disease, unspecified: Secondary | ICD-10-CM | POA: Insufficient documentation

## 2022-09-10 DIAGNOSIS — Z9889 Other specified postprocedural states: Secondary | ICD-10-CM | POA: Insufficient documentation

## 2022-09-10 DIAGNOSIS — Z23 Encounter for immunization: Secondary | ICD-10-CM

## 2022-09-10 DIAGNOSIS — Z79899 Other long term (current) drug therapy: Secondary | ICD-10-CM | POA: Insufficient documentation

## 2022-09-10 DIAGNOSIS — Z131 Encounter for screening for diabetes mellitus: Secondary | ICD-10-CM

## 2022-09-10 DIAGNOSIS — I110 Hypertensive heart disease with heart failure: Secondary | ICD-10-CM | POA: Diagnosis not present

## 2022-09-10 DIAGNOSIS — G8929 Other chronic pain: Secondary | ICD-10-CM

## 2022-09-10 DIAGNOSIS — I4891 Unspecified atrial fibrillation: Secondary | ICD-10-CM

## 2022-09-10 DIAGNOSIS — J4489 Other specified chronic obstructive pulmonary disease: Secondary | ICD-10-CM

## 2022-09-10 DIAGNOSIS — F17210 Nicotine dependence, cigarettes, uncomplicated: Secondary | ICD-10-CM | POA: Insufficient documentation

## 2022-09-10 DIAGNOSIS — J42 Unspecified chronic bronchitis: Secondary | ICD-10-CM | POA: Diagnosis not present

## 2022-09-10 MED ORDER — TIOTROPIUM BROMIDE MONOHYDRATE 18 MCG IN CAPS
18.0000 ug | ORAL_CAPSULE | Freq: Every day | RESPIRATORY_TRACT | 6 refills | Status: DC
  Filled 2022-09-10: qty 30, 30d supply, fill #0
  Filled 2022-10-26: qty 30, 30d supply, fill #1
  Filled 2022-12-08: qty 30, 30d supply, fill #2
  Filled 2023-01-20: qty 30, 30d supply, fill #3
  Filled 2023-02-16: qty 30, 30d supply, fill #4
  Filled 2023-04-23: qty 30, 30d supply, fill #5

## 2022-09-10 NOTE — Patient Instructions (Signed)
Please call for your Colonoscopy appointment  [facility] [address] [place], [place] [address] PH# [phone] 

## 2022-09-10 NOTE — Progress Notes (Addendum)
Subjective:  Patient ID: Morgan Bean, female    DOB: 1965-04-21  Age: 57 y.o. MRN: [id number]  CC: Hypertension   HPI Morgan Bean is a 57 y.o. year old female with a history of Hypertension, paroxysmal Afib, A-flutter (status post ablation) CAD, previous CVA, moderate to severe MR, HFpEF, depression, polysubstance abuse, CKD, emphysema.  Interval History:  Her back pain is managed by Orthopedic and she is being worked up for [employer]. She is under the care of Nephrology for management of her CKD.  She gets dyspneic on mild exertion. She is tachycardic and she endorses smoking a Cigarette just before this appointment. She also attributes this to walking here.  She has no chest pain, lightheadedness, pedal edema. Her chart reveals a diagnosis of emphysema/COPD and she is just on albuterol MDI.  She states she took a puff just before this appointment. She has not seen cardiology since 02/2022.  Denies presence of additional concerns. Past Medical History:  Diagnosis Date   Anemia of other chronic disease 11/13/2013   Anginal pain ([name])    none in past year   Anxiety    Asthma    patient denies   Autoimmune disease ([name]) 12/27/2018   Candidiasis 06/23/2013   Chronic diastolic CHF (congestive heart failure) ([name])    Constipation    Coronary artery disease    Lexiscan myoview (10/12) with significant ST depression upon Lexiscan injection but no ischemia or infarction on perfusion images. Left heart cath (10/12): 30% mid LAD, 30% ostial D1, 50% ostial D2.     Cough 11/13/2013   Depression    Headache 02/22/2014   Hx of cardiovascular stress test    Lexiscan Myoview (2/16): Breast attenuation artifact, no ischemia, EF 64%; low risk   Hyperlipemia    Hypertension    Iron deficiency    hx of   Leukocytoclastic vasculitis ([name])    Rash across lower body, occurred in 9/11, diagnosed by skin biopsy. ANA positive. Thought to be secondary to cocaine use.   Leukopenia 03/21/2013    Lymphadenopathy 03/21/2013   Lymphadenopathy 01/30/2014   Mental disorder    Pancytopenia ([name])    Pancytopenia, acquired ([name]) 07/16/2015   Polysubstance abuse ([name])    Prior cocaine   Pulmonary HTN ([name])    Echo (9/11) with EF 65%, mild LVH, mild AI, mild MR, moderate to possibly severe TR with PA systolic pressure 52 mmHg. Echo (10/12): severe LV hypertrophy, EF 60-65%, mild MR, mild AI, moderate to severe tricuspid regurgitation, PA systolic pressure 38 mmHg.  Echo 4/14: moderate LVH, EF 65%, normal wall motion, diastolic dysfunction, mild AI, mild MR   Shortness of breath    a. PFTs 5/14:  FEV1/FVC 99% predicted; FVC 60% predicted; DLCO mildly reduced, mild restriction, air trapping.;  b.  seen by pulmo (Dr. Shelle Iron) 5/14: mainly upper airway symptoms - ACE d/c'd (sx's better)   Stroke [facility]) 2010ish   Syphilis 04/25/2013   Tobacco abuse    Tricuspid regurgitation    Unspecified deficiency anemia 03/29/2013    Past Surgical History:  Procedure Laterality Date   ANKLE SURGERY     right   BONE MARROW ASPIRATION     CARDIAC CATHETERIZATION     CARDIAC CATHETERIZATION N/A 04/23/2015   Procedure: Right Heart Cath;  Surgeon: Laurey Morale, MD;  Location: [facility] INVASIVE CV LAB;  Service: Cardiovascular;  Laterality: N/A;   CARDIOVERSION N/A 03/11/2018   Procedure: CARDIOVERSION;  Surgeon: Pricilla Riffle, MD;  Location: [facility] ENDOSCOPY;  Service: Cardiovascular;  Laterality: N/A;   I & D EXTREMITY  08/09/2011   Procedure: IRRIGATION AND DEBRIDEMENT EXTREMITY;  Surgeon: Robyne Askew, MD;  Location: [facility] OR;  Service: General;  Laterality: Left;  i & D Left axilla abscess   LYMPH NODE BIOPSY N/A 04/05/2013   Procedure: EXCISIONAL BIOPSY RIGHT SUBMANDIBULAR NODE, NASAL ENDOSCOPIC WITH BIOPSY NASAL PHARYNX;  Surgeon: Flo Shanks, MD;  Location: [facility] OR;  Service: ENT;  Laterality: N/A;   MULTIPLE EXTRACTIONS WITH ALVEOLOPLASTY N/A 03/03/2019   Procedure: MULTIPLE EXTRACTION WITH ALVEOLOPLASTY;   Surgeon: Ocie Doyne, DDS;  Location: [facility] OR;  Service: Oral Surgery;  Laterality: N/A;   TEE WITHOUT CARDIOVERSION N/A 03/11/2018   Procedure: TRANSESOPHAGEAL ECHOCARDIOGRAM (TEE);  Surgeon: Pricilla Riffle, MD;  Location: [facility] ENDOSCOPY;  Service: Cardiovascular;  Laterality: N/A;   TEE WITHOUT CARDIOVERSION N/A 12/27/2020   Procedure: TRANSESOPHAGEAL ECHOCARDIOGRAM (TEE);  Surgeon: Thomasene Ripple, DO;  Location: [facility] ENDOSCOPY;  Service: Cardiovascular;  Laterality: N/A;    Family History  Problem Relation Age of Onset   Coronary artery disease Mother    Diabetes Mother    Diabetes Father    Hypertension Father    Coronary artery disease Father    Heart attack Father    Breast cancer Maternal Grandmother    Diabetes Maternal Grandmother    Heart attack Maternal Grandmother    Stroke Maternal Grandmother    Asthma Grandson     Social History   Socioeconomic History   Marital status: Single    Spouse name: Not on file   Number of children: 2   Years of education: Not on file   Highest education level: GED or equivalent  Occupational History   Occupation: unemployed  Tobacco Use   Smoking status: Former    Packs/day: 0.50    Years: 39.00    Additional pack years: 0.00    Total pack years: 19.50    Types: Cigarettes   Smokeless tobacco: Never  Vaping Use   Vaping Use: Never used  Substance and Sexual Activity   Alcohol use: Not Currently    Comment: used in the past   Drug use: Yes    Frequency: 7.0 times per week    Types: "Crack" cocaine   Sexual activity: Not Currently  Other Topics Concern   Not on file  Social History Narrative   Lives with mom, sisters and brothers.   Social Determinants of Health   Financial Resource Strain: Low Risk  (09/06/2022)   Overall Financial Resource Strain (CARDIA)    Difficulty of Paying Living Expenses: Not very hard  Food Insecurity: Food Insecurity Present (09/06/2022)   Hunger Vital Sign    Worried About Running Out of Food in  the Last Year: Sometimes true    Ran Out of Food in the Last Year: Sometimes true  Transportation Needs: Unmet Transportation Needs (09/06/2022)   PRAPARE - Administrator, Civil Service (Medical): Yes    Lack of Transportation (Non-Medical): Yes  Physical Activity: Insufficiently Active (09/06/2022)   Exercise Vital Sign    Days of Exercise per Week: 1 day    Minutes of Exercise per Session: 10 min  Stress: Stress Concern Present (09/06/2022)   [employer] of Occupational Health - Occupational Stress Questionnaire    Feeling of Stress : Rather much  Social Connections: Unknown (09/06/2022)   Social Connection and Isolation Panel [NHANES]    Frequency of Communication with Friends and Family: Once a week  Frequency of Social Gatherings with Friends and Family: Not on file    Attends Religious Services: 1 to 4 times per year    Active Member of [employer] or Organizations: No    Attends Engineer, structural: Not on file    Marital Status: Never married    Allergies  Allergen Reactions   Ciprofloxacin Itching    Outpatient Medications Prior to Visit  Medication Sig Dispense Refill   albuterol (VENTOLIN HFA) 108 (90 Base) MCG/ACT inhaler Inhale 2 puffs into the lungs every 6 (six) hours as needed for wheezing or shortness of breath. 18 g 2   atorvastatin (LIPITOR) 40 MG tablet Take 1 tablet (40 mg total) by mouth at bedtime. 30 tablet 11   Cholecalciferol (VITAMIN D) 50 MCG (2000 UT) CAPS Take 1 capsule (2,000 Units total) by mouth daily. 30 capsule 6   diltiazem (CARDIZEM CD) 360 MG 24 hr capsule Take 1 capsule (360 mg total) by mouth daily. 90 capsule 1   ferrous sulfate 325 (65 FE) MG tablet Take 325 mg by mouth daily with breakfast.     fluticasone (FLONASE) 50 MCG/ACT nasal spray Place 2 sprays into both nostrils daily. 16 g 11   furosemide (LASIX) 40 MG tablet Take 1 tablet (40 mg total) by mouth daily. 90 tablet 3   hydrOXYzine (ATARAX) 50 MG tablet Take 1  tablet (50 mg total) by mouth every 8 (eight) hours as needed. 90 tablet 3   lisinopril (ZESTRIL) 5 MG tablet Take 1 tablet (5 mg total) by mouth daily. 30 tablet 5   omeprazole (PRILOSEC) 40 MG capsule Take 1 capsule (40 mg total) by mouth daily. Take with water, wait 30-60 minutes after taking before eating or drinking anything but water 30 capsule 2   potassium chloride SA (KLOR-CON M) 20 MEQ tablet Take 1 tablet (20 mEq total) by mouth daily. 90 tablet 2   Rivaroxaban (XARELTO) 15 MG TABS tablet Take 1 tablet (15 mg total) by mouth daily with supper. 30 tablet 5   sodium bicarbonate 650 MG tablet Take 1 tablet (650 mg total) by mouth 2 (two) times daily. 60 tablet 6   spironolactone (ALDACTONE) 25 MG tablet Take 1 tablet (25 mg total) by mouth once daily. 90 tablet 1   nicotine (NICODERM CQ - DOSED IN MG/24 HR) 7 mg/24hr patch Place 1 patch (7 mg total) onto the skin daily. 28 patch 0   oxyCODONE-acetaminophen (PERCOCET/ROXICET) 5-325 MG tablet Take 1-2 tablets by mouth every 8 (eight) hours as needed for severe pain. 6 tablet 0   No facility-administered medications prior to visit.     ROS Review of Systems  Constitutional:  Negative for activity change and appetite change.  HENT:  Negative for sinus pressure and sore throat.   Respiratory:  Positive for shortness of breath. Negative for chest tightness and wheezing.   Cardiovascular:  Negative for chest pain and palpitations.  Gastrointestinal:  Negative for abdominal distention, abdominal pain and constipation.  Genitourinary: Negative.   Musculoskeletal: Negative.   Psychiatric/Behavioral:  Negative for behavioral problems and dysphoric mood.     Objective:  BP 114/85   Pulse (!) 106   Temp 98 F (36.7 C) (Oral)   Ht 5\' 6"  (1.676 m)   Wt 194 lb 9.6 oz (88.3 kg)   LMP 04/24/2013 Comment: states periods come and go  SpO2 100%   BMI 31.41 kg/m      09/10/2022    8:49 AM 05/07/2022   10:43  AM 04/22/2022    1:55 PM   BP/Weight  Systolic BP 114 123   Diastolic BP 85 92   Wt. (Lbs) 194.6 203   BMI 31.41 kg/m2 32.77 kg/m2      Information is confidential and restricted. Go to Review Flowsheets to unlock data.      Physical Exam Constitutional:      Appearance: She is well-developed.  Neck:     Comments: +JVD Cardiovascular:     Rate and Rhythm: Normal rate.     Heart sounds: Normal heart sounds. No murmur heard. Pulmonary:     Breath sounds: Normal breath sounds. No wheezing or rales.     Comments: Dyspneic while speaking Chest:     Chest wall: No tenderness.  Abdominal:     General: Bowel sounds are normal. There is no distension.     Palpations: Abdomen is soft. There is no mass.     Tenderness: There is no abdominal tenderness.  Musculoskeletal:        General: Normal range of motion.     Right lower leg: No edema.     Left lower leg: No edema.  Neurological:     Mental Status: She is alert and oriented to person, place, and time.  Psychiatric:        Mood and Affect: Mood normal.        Latest Ref Rng & Units 03/16/2022   10:54 AM 12/23/2021    8:46 AM 11/06/2021    2:26 PM  CMP  Glucose 70 - 99 mg/dL 97  99  85   BUN 6 - 20 mg/dL 33  44  30   Creatinine 0.44 - 1.00 mg/dL 1.61  0.96  0.45   Sodium 135 - 145 mmol/L 142  139  138   Potassium 3.5 - 5.1 mmol/L 4.1  4.8  4.7   Chloride 98 - 111 mmol/L 112  104  103   CO2 22 - 32 mmol/L 22  19  22    Calcium 8.9 - 10.3 mg/dL 8.9  9.7  9.5   Total Protein 6.0 - 8.5 g/dL  7.9    Total Bilirubin 0.0 - 1.2 mg/dL  0.2    Alkaline Phos 44 - 121 IU/L  113    AST 0 - 40 IU/L  20    ALT 0 - 32 IU/L  22      Lipid Panel     Component Value Date/Time   CHOL 152 12/26/2020 0232   TRIG 51 12/26/2020 0232   HDL 48 12/26/2020 0232   CHOLHDL 3.2 12/26/2020 0232   VLDL 10 12/26/2020 0232   LDLCALC 94 12/26/2020 0232   LDLDIRECT 142.1 12/25/2010 0846    CBC    Component Value Date/Time   WBC 5.9 03/16/2022 1054   RBC 4.50  03/16/2022 1054   HGB 12.0 03/16/2022 1054   HGB 13.8 12/23/2021 0846   HGB 10.0 (L) 07/16/2015 1407   HCT 38.0 03/16/2022 1054   HCT 41.9 12/23/2021 0846   HCT 31.8 (L) 07/16/2015 1407   PLT 212 03/16/2022 1054   PLT 210 12/23/2021 0846   MCV 84.4 03/16/2022 1054   MCV 80 12/23/2021 0846   MCV 78.2 (L) 07/16/2015 1407   MCH 26.7 03/16/2022 1054   MCHC 31.6 03/16/2022 1054   RDW 15.1 03/16/2022 1054   RDW 14.6 12/23/2021 0846   RDW 14.9 (H) 07/16/2015 1407   LYMPHSABS 2.5 05/08/2021 1120   LYMPHSABS 1.8 07/16/2015 1407  MONOABS 0.4 04/17/2021 0428   MONOABS 0.8 07/16/2015 1407   EOSABS 0.3 05/08/2021 1120   BASOSABS 0.1 05/08/2021 1120   BASOSABS 0.0 07/16/2015 1407    Lab Results  Component Value Date   HGBA1C 5.2 12/26/2020    Assessment & Plan:  1. 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 05/23/18  Ongoing dyspnea, oxygen saturation is normal Spiriva added to regimen Will proceed with PFT - Pulmonary function test; Future - tiotropium (SPIRIVA HANDIHALER) 18 MCG inhalation capsule; Place 1 capsule (18 mcg total) into inhaler and inhale daily.  Dispense: 30 capsule; Refill: 6  2. Hypertensive heart disease with chronic combined systolic and diastolic congestive heart failure ([name]) EF of 60 to 65% from 03/2021 Will check BNP in the light of dyspnea and JVD She also states she is yet to take her dose of Lasix this morning - LP+Non-HDL Cholesterol - CMP14+EGFR - CBC with Differential/Platelet - Pro b natriuretic peptide  3. Chronic low back pain, unspecified back pain laterality, unspecified whether sciatica present Uncontrolled She is being worked up by orthopedic for ESI  4. Atrial fibrillation with rapid ventricular response ([name]) Currently in sinus rhythm Continue rate control with Cardizem and anticoagulation with Xarelto  5. Screening for diabetes mellitus - Hemoglobin A1c   Referred for colonoscopy and GI office attempted to contact  her but was unsuccessful.  I have provided her with the number to GI to schedule an appointment. Meds ordered this encounter  Medications   tiotropium (SPIRIVA HANDIHALER) 18 MCG inhalation capsule    Sig: Place 1 capsule (18 mcg total) into inhaler and inhale daily.    Dispense:  30 capsule    Refill:  6    Follow-up: Return in about 6 months (around 03/12/2023) for Chronic medical conditions.       Hoy Register, MD, FAAFP. [facility] [place], [place] [phone]   09/10/2022, 9:56 AM

## 2022-09-11 LAB — CMP14+EGFR
ALT: 14 IU/L (ref 0–32)
AST: 17 IU/L (ref 0–40)
Albumin: 4.5 g/dL (ref 3.8–4.9)
Alkaline Phosphatase: 93 IU/L (ref 44–121)
BUN/Creatinine Ratio: 12 (ref 9–23)
BUN: 17 mg/dL (ref 6–24)
Bilirubin Total: 0.4 mg/dL (ref 0.0–1.2)
CO2: 18 mmol/L — ABNORMAL LOW (ref 20–29)
Calcium: 9.5 mg/dL (ref 8.7–10.2)
Chloride: 107 mmol/L — ABNORMAL HIGH (ref 96–106)
Creatinine, Ser: 1.39 mg/dL — ABNORMAL HIGH (ref 0.57–1.00)
Globulin, Total: 3.1 g/dL (ref 1.5–4.5)
Glucose: 91 mg/dL (ref 70–99)
Potassium: 4.4 mmol/L (ref 3.5–5.2)
Sodium: 141 mmol/L (ref 134–144)
Total Protein: 7.6 g/dL (ref 6.0–8.5)
eGFR: 44 mL/min/{1.73_m2} — ABNORMAL LOW (ref >59)

## 2022-09-11 LAB — CBC WITH DIFFERENTIAL/PLATELET
Basophils Absolute: 0 10*3/uL (ref 0.0–0.2)
Basos: 1 %
EOS (ABSOLUTE): 0.2 10*3/uL (ref 0.0–0.4)
Eos: 3 %
Hematocrit: 42.3 % (ref 34.0–46.6)
Hemoglobin: 13.6 g/dL (ref 11.1–15.9)
Immature Grans (Abs): 0 10*3/uL (ref 0.0–0.1)
Immature Granulocytes: 0 %
Lymphocytes Absolute: 2 10*3/uL (ref 0.7–3.1)
Lymphs: 36 %
MCH: 26.7 pg (ref 26.6–33.0)
MCHC: 32.2 g/dL (ref 31.5–35.7)
MCV: 83 fL (ref 79–97)
Monocytes Absolute: 0.6 10*3/uL (ref 0.1–0.9)
Monocytes: 10 %
Neutrophils Absolute: 2.6 10*3/uL (ref 1.4–7.0)
Neutrophils: 50 %
Platelets: 212 10*3/uL (ref 150–450)
RBC: 5.1 x10E6/uL (ref 3.77–5.28)
RDW: 13.4 % (ref 11.7–15.4)
WBC: 5.4 10*3/uL (ref 3.4–10.8)

## 2022-09-11 LAB — LP+NON-HDL CHOLESTEROL
Cholesterol, Total: 165 mg/dL (ref 100–199)
HDL: 58 mg/dL (ref >39)
LDL Chol Calc (NIH): 91 mg/dL (ref 0–99)
Total Non-HDL-Chol (LDL+VLDL): 107 mg/dL (ref 0–129)
Triglycerides: 89 mg/dL (ref 0–149)
VLDL Cholesterol Cal: 16 mg/dL (ref 5–40)

## 2022-09-11 LAB — HEMOGLOBIN A1C
Est. average glucose Bld gHb Est-mCnc: 117 mg/dL
Hgb A1c MFr Bld: 5.7 % — ABNORMAL HIGH (ref 4.8–5.6)

## 2022-09-11 LAB — PRO B NATRIURETIC PEPTIDE: NT-Pro BNP: 1005 pg/mL — ABNORMAL HIGH (ref 0–287)

## 2022-09-18 ENCOUNTER — Other Ambulatory Visit: Payer: Self-pay

## 2022-09-18 ENCOUNTER — Other Ambulatory Visit (HOSPITAL_BASED_OUTPATIENT_CLINIC_OR_DEPARTMENT_OTHER): Payer: Self-pay | Admitting: Family

## 2022-09-18 MED ORDER — DILTIAZEM HCL ER COATED BEADS 360 MG PO CP24
360.0000 mg | ORAL_CAPSULE | Freq: Every day | ORAL | 1 refills | Status: DC
  Filled 2022-09-18 – 2022-10-02 (×2): qty 90, 90d supply, fill #0
  Filled 2022-10-02: qty 30, 30d supply, fill #0
  Filled 2022-10-26: qty 90, 90d supply, fill #0
  Filled 2023-01-20: qty 90, 90d supply, fill #1

## 2022-09-18 NOTE — Telephone Encounter (Signed)
Rx request sent to pharmacy.  

## 2022-09-24 MED ORDER — OMEPRAZOLE 40 MG PO CPDR
40.0000 mg | DELAYED_RELEASE_CAPSULE | Freq: Every day | ORAL | 2 refills | Status: DC
  Filled 2022-09-24 – 2022-10-26 (×3): qty 30, 30d supply, fill #0

## 2022-09-25 ENCOUNTER — Other Ambulatory Visit: Payer: Self-pay

## 2022-09-29 ENCOUNTER — Encounter (HOSPITAL_BASED_OUTPATIENT_CLINIC_OR_DEPARTMENT_OTHER): Payer: Self-pay

## 2022-09-29 ENCOUNTER — Ambulatory Visit (HOSPITAL_BASED_OUTPATIENT_CLINIC_OR_DEPARTMENT_OTHER): Payer: MEDICAID | Admitting: Family

## 2022-09-29 DIAGNOSIS — I4819 Other persistent atrial fibrillation: Secondary | ICD-10-CM

## 2022-09-29 NOTE — Telephone Encounter (Signed)
I will update the requesting office the pt did not show for her appt today with Gillian Shields, NP for pre op clearance. Pt will need to be rescheduled.

## 2022-09-29 NOTE — Telephone Encounter (Signed)
I s/w the pt who missed her appt today with Gillian Shields, NP. Pt is agreeable to reschedule to 10/13/22 @ 8:50 at [facility] with Gillian Shields, NP. I will update all parties involved. I will also put pt on waitlist for possible sooner appt.

## 2022-09-29 NOTE — Telephone Encounter (Signed)
Patient no-showed her 09/29/22 clinic visit. Will route to scheduling team and preop callback pool to make them aware.   Alver Sorrow, NP

## 2022-09-29 NOTE — Progress Notes (Signed)
Morgan Bean was a no-show. No charge encounter. See more updated note for plan of care.   Alver Sorrow, NP

## 2022-10-02 ENCOUNTER — Other Ambulatory Visit: Payer: Self-pay

## 2022-10-12 ENCOUNTER — Other Ambulatory Visit: Payer: Self-pay

## 2022-10-13 ENCOUNTER — Ambulatory Visit (HOSPITAL_BASED_OUTPATIENT_CLINIC_OR_DEPARTMENT_OTHER): Payer: MEDICAID | Admitting: Family

## 2022-10-13 ENCOUNTER — Encounter (HOSPITAL_BASED_OUTPATIENT_CLINIC_OR_DEPARTMENT_OTHER): Payer: Self-pay | Admitting: Family

## 2022-10-13 ENCOUNTER — Other Ambulatory Visit: Payer: Self-pay

## 2022-10-13 VITALS — BP 168/88 | HR 150 | Ht 66.0 in | Wt 191.8 lb

## 2022-10-13 DIAGNOSIS — I10 Essential (primary) hypertension: Secondary | ICD-10-CM

## 2022-10-13 DIAGNOSIS — I5032 Chronic diastolic (congestive) heart failure: Secondary | ICD-10-CM

## 2022-10-13 DIAGNOSIS — Z720 Tobacco use: Secondary | ICD-10-CM | POA: Diagnosis not present

## 2022-10-13 DIAGNOSIS — Z01818 Encounter for other preprocedural examination: Secondary | ICD-10-CM | POA: Diagnosis not present

## 2022-10-13 DIAGNOSIS — N1831 Chronic kidney disease, stage 3a: Secondary | ICD-10-CM

## 2022-10-13 DIAGNOSIS — I4819 Other persistent atrial fibrillation: Secondary | ICD-10-CM | POA: Diagnosis not present

## 2022-10-13 MED ORDER — RIVAROXABAN 20 MG PO TABS
20.0000 mg | ORAL_TABLET | Freq: Every day | ORAL | 3 refills | Status: DC
  Filled 2022-10-13 – 2022-10-26 (×2): qty 90, 90d supply, fill #0
  Filled 2023-01-20: qty 90, 90d supply, fill #1
  Filled 2023-04-23: qty 90, 90d supply, fill #2
  Filled 2023-07-26: qty 90, 90d supply, fill #3

## 2022-10-13 MED ORDER — RIVAROXABAN 20 MG PO TABS: 20.0000 mg | ORAL_TABLET | Freq: Every day | ORAL | Status: DC

## 2022-10-13 NOTE — Progress Notes (Signed)
Cardiology Office Note:  .   Date:  10/13/2022  ID:  Morgan Bean, DOB [birth year], MRN [id number] PCP: Hoy Register, MD  [facility] Providers Cardiologist:  Chilton Si, MD    History of Present Illness: .   Morgan Bean is a 57 y.o. female with a hx of left atrial appendage thrombus, atrial flutter post ablation with recurrent atrial flutter/fib, cocaine use, anxiety, hypertension CKDIIIa, chronic diastolic heart failure, prior CVA, mild pulmonary hypertension, COPD, OSA not on CPAP, depression last seen 02/27/22.   Admitted 02/2018 with acute on chronic diastolic heart failure and atrial fib/flutter with RVR. Echo with LVEF 50-55%, mild AI, moderate to severe MR. Diltiazem and Xarelto started.    ED visit 04/2018 due to atrial flutter. Urine tox positive for amphetamines. She had cardioversion but did not maintain NSR. She was subsequently started on Amiodarone and maintained NSR.    She was admitted 12/2020 with community acquired pneumonia and atrial flutter. Positive for cocaine on admission. TEE showed left atrial appendage thrombus (not on OAC prior to admission) so cardioversion could not be pursued. LVEF 55-60%, RV normal size/function, mild to moderate MR, moderate AI. Dscharged on Xarelto, Amiodarone, Diltiazem with plans to schedule outpatient cardioversion.    She was lost to follow-up until 04/10/2021. Found to be in atrial fibrillation 140 bpm.  Diltiazem, Xarelto 20 were resumed. Admitted 1/22 - 04/17/2021 with A-fib RVR, acute on chronic diastolic heart failure, COVID19.  She had used cocaine 1 day prior to hospitalization.  Echo 04/14/2021 LVEF 60 to 65%, no RWMA, bilateral atria moderately dilated, mild MR. At follow-up 06/26/2021 she was set up for TEE and cardioversion but she canceled procedure.  Seen 09/2021 and preferred to proceed with rate control of atrial flutter - Diltiazem increased to 360 mg daily. She had been clean for 2 months and was living with her mother.   Last seen 02/2022 due to hypotension lisinopril reduced to 5 mg daily.  She was given prescription for nicotine patch.  She saw PCP 09/10/2022 with dyspnea, volume overload with ProBNP 1005 and was recommended for ED evaluation but it does not appear she went to the ED.  Presents today for preop clearance for L5-S1 ESI with Dr. Lorrine Kin. She notes back pain and is hopeful for resolution with ESI. She continues to stay with her daughter. Notes dyspnea with activity described as sensation of not being able to catch her breath. She is using her Spiriva morning with improvement in symptoms. Has not required her Albuterol. Reports no edema, orthopnea, PND, lightheadedness, nor dizziness. Only rare palpitations. She tells me she is taking her Lasix "most days" - reiterated importance of daily dosing. Smoking 0.5 PPD with goal to reduce though difficult as others in her home smoke. She last used "a little bit" of cocaine over the weekend. We discussed importance of total avoidance.    ROS: Please see the history of present illness.    All other systems reviewed and are negative.   Studies Reviewed: [name]   EKG Interpretation Date/Time:  Tuesday October 13 2022 08:28:31 EDT Ventricular Rate:  150 PR Interval:    QRS Duration:  82 QT Interval:  302 QTC Calculation: 477 R Axis:   46  Text Interpretation: Atrial fibrillation with rapid ventricular response No acute ST/T wave changes. Confirmed by Gillian Shields ([id number]) on 10/13/2022 8:30:24 AM    Cardiac Studies & Procedures   CARDIAC CATHETERIZATION  CARDIAC CATHETERIZATION 04/23/2015  Narrative 1. Near-normal right and left heart filling  pressures, no pulmonary hypertension.  No evidence for hemodynamically significant TR.  She can continue the same Lasix dose with no change.  I suspect that COPD may be the major cause of her dyspnea at this point. 2. Cardiac output lower than I would expect.     ECHOCARDIOGRAM  ECHOCARDIOGRAM COMPLETE  04/14/2021  Narrative ECHOCARDIOGRAM REPORT    Patient Name:   Morgan Bean Date of Exam: 04/14/2021 Medical Rec #:  [id number]    Height:       66.0 in Accession #:    [id number]   Weight:       179.0 lb Date of Birth:  08/16/1965    BSA:          1.908 m Patient Age:    55 years     BP:           138/95 mmHg Patient Gender: F            HR:           87 bpm. Exam Location:  Inpatient  Procedure: 2D Echo, Cardiac Doppler and Color Doppler  Indications:    ACUTE CHF DIASTOLIC  History:        Patient has prior history of Echocardiogram examinations, most recent 12/25/2020. CHF, CAD, Stroke, Signs/Symptoms:Shortness of Breath; Risk Factors:Hypertension. TR / HLD.  Sonographer:    Festus Barren Referring Phys: [phone] JARED M GARDNER   Sonographer Comments: No subcostal window. IMPRESSIONS   1. Left ventricular ejection fraction, by estimation, is 60 to 65%. The left ventricle has normal function. The left ventricle has no regional wall motion abnormalities. Left ventricular diastolic parameters were normal. 2. Right ventricular systolic function is normal. The right ventricular size is normal. 3. Left atrial size was moderately dilated. 4. Right atrial size was moderately dilated. 5. The mitral valve is abnormal. Mild mitral valve regurgitation. No evidence of mitral stenosis. 6. The aortic valve is tricuspid. There is mild calcification of the aortic valve. Aortic valve regurgitation is moderate. Aortic valve sclerosis is present, with no evidence of aortic valve stenosis. 7. The inferior vena cava is normal in size with greater than 50% respiratory variability, suggesting right atrial pressure of 3 mmHg.  FINDINGS Left Ventricle: Left ventricular ejection fraction, by estimation, is 60 to 65%. The left ventricle has normal function. The left ventricle has no regional wall motion abnormalities. The left ventricular internal cavity size was normal in size. There is no left  ventricular hypertrophy. Left ventricular diastolic parameters were normal.  Right Ventricle: IVC NOT SEEN. The right ventricular size is normal. No increase in right ventricular wall thickness. Right ventricular systolic function is normal.  Left Atrium: Left atrial size was moderately dilated.  Right Atrium: Right atrial size was moderately dilated.  Pericardium: There is no evidence of pericardial effusion.  Mitral Valve: The mitral valve is abnormal. There is mild thickening of the mitral valve leaflet(s). There is mild calcification of the mitral valve leaflet(s). Mild mitral valve regurgitation. No evidence of mitral valve stenosis. MV peak gradient, 96.4 mmHg. The mean mitral valve gradient is 62.0 mmHg.  Tricuspid Valve: The tricuspid valve is normal in structure. Tricuspid valve regurgitation is mild . No evidence of tricuspid stenosis.  Aortic Valve: The aortic valve is tricuspid. There is mild calcification of the aortic valve. Aortic valve regurgitation is moderate. Aortic regurgitation PHT measures 1080 msec. Aortic valve sclerosis is present, with no evidence of aortic valve stenosis. Aortic valve mean gradient measures 9.0 mmHg. Aortic  valve peak gradient measures 17.0 mmHg. Aortic valve area, by VTI measures 0.76 cm.  Pulmonic Valve: The pulmonic valve was normal in structure. Pulmonic valve regurgitation is not visualized. No evidence of pulmonic stenosis.  Aorta: The aortic root is normal in size and structure.  Venous: The inferior vena cava is normal in size with greater than 50% respiratory variability, suggesting right atrial pressure of 3 mmHg.  IAS/Shunts: The interatrial septum was not well visualized.   LEFT VENTRICLE PLAX 2D LVIDd:         4.20 cm LVIDs:         3.00 cm LV PW:         1.40 cm LV IVS:        1.10 cm LVOT diam:     1.80 cm LV SV:         21 LV SV Index:   11 LVOT Area:     2.54 cm  LV Volumes (MOD) LV vol d, MOD A2C: 72.6 ml LV vol  d, MOD A4C: 76.2 ml LV vol s, MOD A2C: 43.3 ml LV vol s, MOD A4C: 44.7 ml LV SV MOD A2C:     29.3 ml LV SV MOD A4C:     76.2 ml LV SV MOD BP:      33.3 ml  RIGHT VENTRICLE RV S prime:     20.70 cm/s TAPSE (M-mode): 1.9 cm  LEFT ATRIUM              Index        RIGHT ATRIUM           Index LA diam:        4.00 cm  2.10 cm/m   RA Area:     28.00 cm LA Vol (A2C):   136.0 ml 71.29 ml/m  RA Volume:   101.00 ml 52.94 ml/m LA Vol (A4C):   74.2 ml  38.89 ml/m LA Biplane Vol: 102.0 ml 53.47 ml/m AORTIC VALVE                     PULMONIC VALVE AV Area (Vmax):    0.87 cm      PV Vmax:       0.68 m/s AV Area (Vmean):   0.79 cm      PV Vmean:      42.500 cm/s AV Area (VTI):     0.76 cm      PV VTI:        0.101 m AV Vmax:           206.00 cm/s   PV Peak grad:  1.8 mmHg AV Vmean:          133.000 cm/s  PV Mean grad:  1.0 mmHg AV VTI:            0.271 m AV Peak Grad:      17.0 mmHg AV Mean Grad:      9.0 mmHg LVOT Vmax:         70.20 cm/s LVOT Vmean:        41.400 cm/s LVOT VTI:          0.081 m LVOT/AV VTI ratio: 0.30 AI PHT:            1080 msec  AORTA Ao Root diam: 3.30 cm Ao Asc diam:  3.50 cm  MITRAL VALVE              TRICUSPID VALVE MV Area VTI:  0.15 cm  TV Peak grad:   22.1 mmHg MV Peak grad: 96.4 mmHg   TV Mean grad:   13.0 mmHg MV Mean grad: 62.0 mmHg   TV Vmax:        2.35 m/s MV Vmax:      4.91 m/s    TV Vmean:       170.0 cm/s MV Vmean:     370.0 cm/s  TV VTI:         0.59 msec TR Peak grad:   25.4 mmHg TR Vmax:        252.00 cm/s  SHUNTS Systemic VTI:  0.08 m Systemic Diam: 1.80 cm  Charlton Haws MD Electronically signed by Charlton Haws MD Signature Date/Time: 04/14/2021/9:40:16 AM    Final   TEE  ECHO TEE 12/27/2020  Narrative TRANSESOPHOGEAL ECHO REPORT    Patient Name:   Morgan Bean Date of Exam: 12/27/2020 Medical Rec #:  [id number]    Height:       66.0 in Accession #:    [id number]   Weight:       159.4 lb Date of Birth:  08-25-1965     BSA:          1.816 m Patient Age:    55 years     BP:           127/92 mmHg Patient Gender: F            HR:           77 bpm. Exam Location:  Inpatient  Procedure: Transesophageal Echo, Cardiac Doppler and Color Doppler  Indications:     Atrial Flutter  History:         Patient has prior history of Echocardiogram examinations, most recent 12/25/2020.  Sonographer:     Margreta Journey RDCS Referring Phys:  [id number] Beatriz Stallion Diagnosing Phys: Thomasene Ripple DO  PROCEDURE: After discussion of the risks and benefits of a TEE, an informed consent was obtained from the patient. The transesophogeal probe was passed without difficulty through the esophogus of the patient. Imaged were obtained with the patient in a left lateral decubitus position. Local oropharyngeal anesthetic was provided with viscous lidocaine. Sedation performed by different physician. The patient was monitored while under deep sedation. Anesthestetic sedation was provided intravenously by Anesthesiology: 298mg  of Propofol. Image quality was good. The patient developed no complications during the procedure.  IMPRESSIONS   1. Left ventricular ejection fraction, by estimation, is 55 to 60%. The left ventricle has normal function. 2. Right ventricular systolic function is normal. The right ventricular size is normal. 3. A left atrial/left atrial appendage thrombus was detected. The LAA emptying velocity was 22 cm/s. 4. The mitral valve is normal in structure. Mild to moderate mitral valve regurgitation. 5. The aortic valve is tricuspid. Aortic valve regurgitation is moderate.  FINDINGS Left Ventricle: Left ventricular ejection fraction, by estimation, is 55 to 60%. The left ventricle has normal function. The left ventricular internal cavity size was normal in size.  Right Ventricle: The right ventricular size is normal. No increase in right ventricular wall thickness. Right ventricular systolic function is  normal.  Left Atrium: Left atrial size was not well visualized. Spontaneous echo contrast was present in the left atrial appendage. A left atrial/left atrial appendage thrombus was detected. The LAA emptying velocity was 22 cm/s.  Right Atrium: Right atrial size was not well visualized.  Pericardium: There is no evidence of pericardial effusion.  Mitral Valve: The mitral valve is normal  in structure. Mild to moderate mitral valve regurgitation.  Tricuspid Valve: The tricuspid valve is normal in structure. Tricuspid valve regurgitation is trivial.  Aortic Valve: The aortic valve is tricuspid. Aortic valve regurgitation is moderate.  Pulmonic Valve: The pulmonic valve was normal in structure. Pulmonic valve regurgitation is trivial.  Aorta: The aortic root is normal in size and structure.  IAS/Shunts: No atrial level shunt detected by color flow Doppler.  Lavona Mound Tobb DO Electronically signed by Thomasene Ripple DO Signature Date/Time: 12/27/2020/5:43:49 PM    Final            Risk Assessment/Calculations:    CHA2DS2-VASc Score = 6   This indicates a 9.7% annual risk of stroke. The patient's score is based upon: CHF History: 1 HTN History: 1 Diabetes History: 0 Stroke History: 2 Vascular Disease History: 1 Age Score: 0 Gender Score: 1    HYPERTENSION CONTROL Vitals:   10/13/22 0824 10/13/22 0849  BP: (!) 166/98 (!) 168/88    The patient's blood pressure is elevated above target today.  In order to address the patient's elevated BP: Blood pressure will be monitored at home to determine if medication changes need to be made.; Follow up with general cardiology has been recommended. Gillian Shields, NP to discuss pill pack with pharmacy.)          Physical Exam:   VS:  BP (!) 168/88   Pulse (!) 150   Ht 5\' 6"  (1.676 m)   Wt 191 lb 12.8 oz (87 kg)   LMP 04/24/2013 Comment: states periods come and go  BMI 30.96 kg/m    Wt Readings from Last 3 Encounters:  10/13/22 191  lb 12.8 oz (87 kg)  09/10/22 194 lb 9.6 oz (88.3 kg)  05/07/22 203 lb (92.1 kg)    GEN: Well nourished, well developed in no acute distress NECK: No JVD; No carotid bruits CARDIAC: IRIR, tachycardic, no murmurs, rubs, gallops RESPIRATORY:  Clear to auscultation without rales, wheezing or rhonchi  ABDOMEN: Soft, non-tender, non-distended EXTREMITIES:  No edema; No deformity   ASSESSMENT AND PLAN: .    Preop - Pending L5-S1 ESI with Dr. Lorrine Kin. According to the Revised Cardiac Risk Index (RCRI), her Perioperative Risk of Major Cardiac Event is (%): 6.6. Her Functional Capacity in METs is: 4.73 according to the Duke Activity Status Index (DASI). Per AHA/ACC guidelines, she is deemed acceptable risk for the planned procedure without additional cardiovascular testing. Will route to surgical team so they are aware.  Per pharmacy review and discussion with Dr. Cristal Deer (DOD) today, she may hold Xarelto 3 days prior to procedure with prompt resumption of therapy post-procedure.   Atrial flutter / Atrial fibrillation / LAA thrombus / Fatigue - Ablative therapy in 2018. Admission 12/2020 with recurrent atrial flutter and LAA thrombus unable to be cardioverted.  Plan for rate control since that time. EKG today atrial fib 150 bpm in setting of not taking her Diltiazem 360mg  this AM. Discussed need for regular dosing of Diltiazem. Avoid beta-blocker due to cocaine use though if rate persistently difficult to control could trial labetolol or carvedilol with addition of vasodilator. CHA2DS2-VASc Score = 6 [CHF History: 1, HTN History: 1, Diabetes History: 0, Stroke History: 2, Vascular Disease History: 1, Age Score: 0, Gender Score: 1].  Therefore, the patient's annual risk of stroke is 9.7 %.    Renal function improved from previous.  6-20/24 creatinine 1.3 time with creatinine clearance 60.  As such we will increase her Xarelto to 20  mg daily.  Update BMP today. Denies bleeding complications - 09/10/22 Hb  13.6.     HTN - lisinopril previously reduced due to hypotension, lightheadedness. BP elevated today but has not yet taken her medications. Discussed tactics for taking medication regularly. Will reach out to pharmacy colleagues to see if pill pack is an option. If BP persistently elevated at follow up in 2 weeks, will adjust antihypertensive regimen.    Cocaine use / Tobacco use- Smoking 0.5 PPD feeling a bit overwhelmed by trying to quit as others in her home smoke and significant stressors with back pain. Discussed readdressing readiness to quit after her back injection. She last used "a little bit" of cocaine over the weekend per report - reiterated need to avoid completely.  Prediabetes - 09/10/22 A1c 6.7. Recommend to gradually increase physical activity (presently limited by back pain) and low carbohydrate diet.    CKDIIIa - Careful titration of diuretic and antihypertensive.  Follows with [facility] Dr. Ronalee Belts.    Diastolic heart failure - Volume overload likely due to missed doses Lasix.  Discussed importance of daily dosing.  Increase Lasix to 80 mg daily x 2 days then return to 40mg  daily. BNP/BMP today. Additional GDMT Spironolactone. No beta blocker due to cocaine use. Could consider SGLT2i at follow up but may be cost prohibitive. Entresto previously precluded by hypotension - readdress at follow up. Low sodium diet, fluid restriction <2L, and daily weights encouraged. Educated to contact our office for weight gain of 2 lbs overnight or 5 lbs in one week.    COPD - Continue to follow with PCP. No evidence of acute exacerbation. Dyspnea improved with Spiriva, not requiring PRN albuterol.   Aortic atherosclerosis / Coronary calcification on CT / HLD - 09/10/22 LDL 91. Continue Atorvastatin 40mg  daily. No ASA due to [facility]. Stable with no anginal symptoms. No indication for ischemic evaluation.  Readdress lipid goals at follow up.          Dispo: follow up in 2  weeks  Signed, Alver Sorrow, NP

## 2022-10-13 NOTE — Patient Instructions (Addendum)
Medication Instructions:  Your physician has recommended you make the following change in your medication:   Increase Lasix 80mg  ( 2 tablets) for two days   Increase Xarelto to 20mg  daily   We will reach out to the pharmacy about getting you pill packs *If you need a refill on your cardiac medications before your next appointment, please call your pharmacy*   Lab Work: Your physician recommends that you return for lab work today- BMP, BNP  If you have labs (blood work) drawn today and your tests are completely normal, you will receive your results only by: MyChart Message (if you have MyChart) OR A paper copy in the mail If you have any lab test that is abnormal or we need to change your treatment, we will call you to review the results.   Testing/Procedures: You may hold your Kathryne Hitch 3 days prior to procedure.    Follow-Up: At [facility], you and your health needs are our priority.  As part of our continuing mission to provide you with exceptional heart care, we have created designated Provider Care Teams.  These Care Teams include your primary Cardiologist (physician) and Advanced Practice Providers (APPs -  Physician Assistants and Nurse Practitioners) who all work together to provide you with the care you need, when you need it.  We recommend signing up for the patient portal called "MyChart".  Sign up information is provided on this After Visit Summary.  MyChart is used to connect with patients for Virtual Visits (Telemedicine).  Patients are able to view lab/test results, encounter notes, upcoming appointments, etc.  Non-urgent messages can be sent to your provider as well.   To learn more about what you can do with MyChart, go to [email or web address].    Your next appointment:   2-3 weeks either nurse visit or office visit, if nurse visit please also schedule 2 month follow up office visit

## 2022-10-14 LAB — BASIC METABOLIC PANEL
BUN/Creatinine Ratio: 15 (ref 9–23)
BUN: 24 mg/dL (ref 6–24)
CO2: 21 mmol/L (ref 20–29)
Calcium: 9.9 mg/dL (ref 8.7–10.2)
Chloride: 107 mmol/L — ABNORMAL HIGH (ref 96–106)
Creatinine, Ser: 1.59 mg/dL — ABNORMAL HIGH (ref 0.57–1.00)
Glucose: 99 mg/dL (ref 70–99)
Potassium: 5.4 mmol/L — ABNORMAL HIGH (ref 3.5–5.2)
Sodium: 144 mmol/L (ref 134–144)
eGFR: 37 mL/min/{1.73_m2} — ABNORMAL LOW (ref >59)

## 2022-10-14 LAB — BRAIN NATRIURETIC PEPTIDE: BNP: 327 pg/mL — ABNORMAL HIGH (ref 0.0–100.0)

## 2022-10-15 ENCOUNTER — Telehealth (HOSPITAL_BASED_OUTPATIENT_CLINIC_OR_DEPARTMENT_OTHER): Payer: Self-pay

## 2022-10-15 DIAGNOSIS — I10 Essential (primary) hypertension: Secondary | ICD-10-CM

## 2022-10-15 NOTE — Telephone Encounter (Addendum)
Call attempted, no answer, VM not set up, will retry contact     ----- Message from Alver Sorrow sent at 10/15/2022  7:15 AM EDT ----- BNP with mild volume overload. Potassium was mildly elevated. Kidney function overall stable.   At last visit she was recommended to increase Lasix to 2 tabs daily x 2 days. This will help to lower her volume overload and lower potassium. Would recommend take the 2 tablets of Lasix for 4 days, then return to 1 tablet daily. Repeat BMP Friday or Monday for monitoring.

## 2022-10-16 NOTE — Telephone Encounter (Signed)
2nd call attempt, no answer, VM not set up

## 2022-10-19 NOTE — Telephone Encounter (Signed)
3rd call attempt, results reviewed with patient. Patient states she will take an extra two days of lasix. Will come for labs either Wednesday or Thursday. Order placed.

## 2022-10-19 NOTE — Addendum Note (Signed)
Addended by: Marlene Lard on: 10/19/2022 08:31 AM   Modules accepted: Orders

## 2022-10-20 ENCOUNTER — Other Ambulatory Visit: Payer: Self-pay

## 2022-10-26 ENCOUNTER — Other Ambulatory Visit: Payer: Self-pay

## 2022-10-27 ENCOUNTER — Encounter (HOSPITAL_BASED_OUTPATIENT_CLINIC_OR_DEPARTMENT_OTHER): Payer: Self-pay | Admitting: Family

## 2022-10-27 ENCOUNTER — Ambulatory Visit (HOSPITAL_BASED_OUTPATIENT_CLINIC_OR_DEPARTMENT_OTHER): Payer: MEDICAID | Admitting: Family

## 2022-10-27 ENCOUNTER — Other Ambulatory Visit: Payer: Self-pay

## 2022-10-27 VITALS — BP 118/80 | HR 98 | Ht 66.0 in | Wt 191.0 lb

## 2022-10-27 DIAGNOSIS — E782 Mixed hyperlipidemia: Secondary | ICD-10-CM

## 2022-10-27 DIAGNOSIS — D6859 Other primary thrombophilia: Secondary | ICD-10-CM

## 2022-10-27 DIAGNOSIS — N1831 Chronic kidney disease, stage 3a: Secondary | ICD-10-CM | POA: Diagnosis not present

## 2022-10-27 DIAGNOSIS — I4819 Other persistent atrial fibrillation: Secondary | ICD-10-CM | POA: Diagnosis not present

## 2022-10-27 DIAGNOSIS — I10 Essential (primary) hypertension: Secondary | ICD-10-CM

## 2022-10-27 DIAGNOSIS — Z720 Tobacco use: Secondary | ICD-10-CM

## 2022-10-27 NOTE — Patient Instructions (Signed)
Medication Instructions:  Your physician recommends that you continue on your current medications as directed. Please refer to the Current Medication list given to you today.  *If you need a refill on your cardiac medications before your next appointment, please call your pharmacy*   Lab Work: Your physician recommends that you return for lab work in 2-3 months for Fasting Lipid panel and Cmp   If you have labs (blood work) drawn today and your tests are completely normal, you will receive your results only by: MyChart Message (if you have MyChart) OR A paper copy in the mail If you have any lab test that is abnormal or we need to change your treatment, we will call you to review the results.  Follow-Up: At [facility], you and your health needs are our priority.  As part of our continuing mission to provide you with exceptional heart care, we have created designated Provider Care Teams.  These Care Teams include your primary Cardiologist (physician) and Advanced Practice Providers (APPs -  Physician Assistants and Nurse Practitioners) who all work together to provide you with the care you need, when you need it.  We recommend signing up for the patient portal called "MyChart".  Sign up information is provided on this After Visit Summary.  MyChart is used to connect with patients for Virtual Visits (Telemedicine).  Patients are able to view lab/test results, encounter notes, upcoming appointments, etc.  Non-urgent messages can be sent to your provider as well.   To learn more about what you can do with MyChart, go to [email or web address].    Your next appointment:   4-6 months with Dr. Duke Salvia or Gillian Shields, NP   Other Instructions We will talk to the [facility] about getting you set up on pill packs starting next month.

## 2022-10-27 NOTE — Progress Notes (Signed)
Cardiology Office Note:  .   Date:  10/27/2022  ID:  Saunders Revel, DOB [birth year], MRN [id number] PCP: Hoy Register, MD  [facility] Providers Cardiologist:  Chilton Si, MD    History of Present Illness: .   Morgan Bean is a 57 y.o. female with a hx of left atrial appendage thrombus, atrial flutter post ablation with recurrent atrial flutter/fib, cocaine use, anxiety, hypertension CKDIIIa, chronic diastolic heart failure, prior CVA, mild pulmonary hypertension, COPD, OSA not on CPAP, depression last seen 02/27/22.   Admitted 02/2018 with acute on chronic diastolic heart failure and atrial fib/flutter with RVR. Echo with LVEF 50-55%, mild AI, moderate to severe MR. Diltiazem and Xarelto started.    ED visit 04/2018 due to atrial flutter. Urine tox positive for amphetamines. She had cardioversion but did not maintain NSR. She was subsequently started on Amiodarone and maintained NSR.    She was admitted 12/2020 with community acquired pneumonia and atrial flutter. Positive for cocaine on admission. TEE showed left atrial appendage thrombus (not on OAC prior to admission) so cardioversion could not be pursued. LVEF 55-60%, RV normal size/function, mild to moderate MR, moderate AI. Dscharged on Xarelto, Amiodarone, Diltiazem with plans to schedule outpatient cardioversion.    She was lost to follow-up until 04/10/2021. Found to be in atrial fibrillation 140 bpm.  Diltiazem, Xarelto 20 were resumed. Admitted 1/22 - 04/17/2021 with A-fib RVR, acute on chronic diastolic heart failure, COVID19.  She had used cocaine 1 day prior to hospitalization.  Echo 04/14/2021 LVEF 60 to 65%, no RWMA, bilateral atria moderately dilated, mild MR. At follow-up 06/26/2021 she was set up for TEE and cardioversion but she canceled procedure.  Seen 09/2021 and preferred to proceed with rate control of atrial flutter - Diltiazem increased to 360 mg daily. She had been clean for 2 months and was living with her mother.   Last seen 02/2022 due to hypotension lisinopril reduced to 5 mg daily.  She was given prescription for nicotine patch.  She saw PCP 09/10/2022 with dyspnea, volume overload with ProBNP 1005 and was recommended for ED evaluation but it does not appear she went to the ED. Seen in clinic 10/13/22.  Lasix increased for 3 days for volume overload.   Noted she had used "a little bit" of cocaine the week provided - cessation encouraged. She was provided clearance for L5-S1 ESI with Dr. Lorrine Kin.  Presents today for follow up. She continues to stay with her daughter.  No cocaine use since last visit.  Smoking 0.5 PPD with plans to further reduce after back procedure. Exertional dyspnea improved with increased Lasix. Using her Spiriva QAM with improvement in symptoms. Has not required her Albuterol. Reports no edema, orthopnea, PND, lightheadedness, nor dizziness. Only rare palpitations.Has been taking her Lasix more consistently. Interested in delivery and adherence packaging of medications. Picking up prescriptions today. Will try to coordinate with next month of medications.   ROS: Please see the history of present illness.    All other systems reviewed and are negative.   Studies Reviewed: .        Cardiac Studies & Procedures   CARDIAC CATHETERIZATION  CARDIAC CATHETERIZATION 04/23/2015  Narrative 1. Near-normal right and left heart filling pressures, no pulmonary hypertension.  No evidence for hemodynamically significant TR.  She can continue the same Lasix dose with no change.  I suspect that COPD may be the major cause of her dyspnea at this point. 2. Cardiac output lower than I would expect.  ECHOCARDIOGRAM  ECHOCARDIOGRAM COMPLETE 04/14/2021  Narrative ECHOCARDIOGRAM REPORT    Patient Name:   Morgan Bean Date of Exam: 04/14/2021 Medical Rec #:  [id number]    Height:       66.0 in Accession #:    [id number]   Weight:       179.0 lb Date of Birth:  Aug 22, 1965    BSA:          1.908  m Patient Age:    55 years     BP:           138/95 mmHg Patient Gender: F            HR:           87 bpm. Exam Location:  Inpatient  Procedure: 2D Echo, Cardiac Doppler and Color Doppler  Indications:    ACUTE CHF DIASTOLIC  History:        Patient has prior history of Echocardiogram examinations, most recent 12/25/2020. CHF, CAD, Stroke, Signs/Symptoms:Shortness of Breath; Risk Factors:Hypertension. TR / HLD.  Sonographer:    Festus Barren Referring Phys: [phone] JARED M GARDNER   Sonographer Comments: No subcostal window. IMPRESSIONS   1. Left ventricular ejection fraction, by estimation, is 60 to 65%. The left ventricle has normal function. The left ventricle has no regional wall motion abnormalities. Left ventricular diastolic parameters were normal. 2. Right ventricular systolic function is normal. The right ventricular size is normal. 3. Left atrial size was moderately dilated. 4. Right atrial size was moderately dilated. 5. The mitral valve is abnormal. Mild mitral valve regurgitation. No evidence of mitral stenosis. 6. The aortic valve is tricuspid. There is mild calcification of the aortic valve. Aortic valve regurgitation is moderate. Aortic valve sclerosis is present, with no evidence of aortic valve stenosis. 7. The inferior vena cava is normal in size with greater than 50% respiratory variability, suggesting right atrial pressure of 3 mmHg.  FINDINGS Left Ventricle: Left ventricular ejection fraction, by estimation, is 60 to 65%. The left ventricle has normal function. The left ventricle has no regional wall motion abnormalities. The left ventricular internal cavity size was normal in size. There is no left ventricular hypertrophy. Left ventricular diastolic parameters were normal.  Right Ventricle: IVC NOT SEEN. The right ventricular size is normal. No increase in right ventricular wall thickness. Right ventricular systolic function is normal.  Left Atrium: Left atrial  size was moderately dilated.  Right Atrium: Right atrial size was moderately dilated.  Pericardium: There is no evidence of pericardial effusion.  Mitral Valve: The mitral valve is abnormal. There is mild thickening of the mitral valve leaflet(s). There is mild calcification of the mitral valve leaflet(s). Mild mitral valve regurgitation. No evidence of mitral valve stenosis. MV peak gradient, 96.4 mmHg. The mean mitral valve gradient is 62.0 mmHg.  Tricuspid Valve: The tricuspid valve is normal in structure. Tricuspid valve regurgitation is mild . No evidence of tricuspid stenosis.  Aortic Valve: The aortic valve is tricuspid. There is mild calcification of the aortic valve. Aortic valve regurgitation is moderate. Aortic regurgitation PHT measures 1080 msec. Aortic valve sclerosis is present, with no evidence of aortic valve stenosis. Aortic valve mean gradient measures 9.0 mmHg. Aortic valve peak gradient measures 17.0 mmHg. Aortic valve area, by VTI measures 0.76 cm.  Pulmonic Valve: The pulmonic valve was normal in structure. Pulmonic valve regurgitation is not visualized. No evidence of pulmonic stenosis.  Aorta: The aortic root is normal in size and structure.  Venous: The inferior  vena cava is normal in size with greater than 50% respiratory variability, suggesting right atrial pressure of 3 mmHg.  IAS/Shunts: The interatrial septum was not well visualized.   LEFT VENTRICLE PLAX 2D LVIDd:         4.20 cm LVIDs:         3.00 cm LV PW:         1.40 cm LV IVS:        1.10 cm LVOT diam:     1.80 cm LV SV:         21 LV SV Index:   11 LVOT Area:     2.54 cm  LV Volumes (MOD) LV vol d, MOD A2C: 72.6 ml LV vol d, MOD A4C: 76.2 ml LV vol s, MOD A2C: 43.3 ml LV vol s, MOD A4C: 44.7 ml LV SV MOD A2C:     29.3 ml LV SV MOD A4C:     76.2 ml LV SV MOD BP:      33.3 ml  RIGHT VENTRICLE RV S prime:     20.70 cm/s TAPSE (M-mode): 1.9 cm  LEFT ATRIUM              Index         RIGHT ATRIUM           Index LA diam:        4.00 cm  2.10 cm/m   RA Area:     28.00 cm LA Vol (A2C):   136.0 ml 71.29 ml/m  RA Volume:   101.00 ml 52.94 ml/m LA Vol (A4C):   74.2 ml  38.89 ml/m LA Biplane Vol: 102.0 ml 53.47 ml/m AORTIC VALVE                     PULMONIC VALVE AV Area (Vmax):    0.87 cm      PV Vmax:       0.68 m/s AV Area (Vmean):   0.79 cm      PV Vmean:      42.500 cm/s AV Area (VTI):     0.76 cm      PV VTI:        0.101 m AV Vmax:           206.00 cm/s   PV Peak grad:  1.8 mmHg AV Vmean:          133.000 cm/s  PV Mean grad:  1.0 mmHg AV VTI:            0.271 m AV Peak Grad:      17.0 mmHg AV Mean Grad:      9.0 mmHg LVOT Vmax:         70.20 cm/s LVOT Vmean:        41.400 cm/s LVOT VTI:          0.081 m LVOT/AV VTI ratio: 0.30 AI PHT:            1080 msec  AORTA Ao Root diam: 3.30 cm Ao Asc diam:  3.50 cm  MITRAL VALVE              TRICUSPID VALVE MV Area VTI:  0.15 cm    TV Peak grad:   22.1 mmHg MV Peak grad: 96.4 mmHg   TV Mean grad:   13.0 mmHg MV Mean grad: 62.0 mmHg   TV Vmax:        2.35 m/s MV Vmax:      4.91  m/s    TV Vmean:       170.0 cm/s MV Vmean:     370.0 cm/s  TV VTI:         0.59 msec TR Peak grad:   25.4 mmHg TR Vmax:        252.00 cm/s  SHUNTS Systemic VTI:  0.08 m Systemic Diam: 1.80 cm  Charlton Haws MD Electronically signed by Charlton Haws MD Signature Date/Time: 04/14/2021/9:40:16 AM    Final   TEE  ECHO TEE 12/27/2020  Narrative TRANSESOPHOGEAL ECHO REPORT    Patient Name:   EMMANUELA PERRINS Date of Exam: 12/27/2020 Medical Rec #:  [id number]    Height:       66.0 in Accession #:    [id number]   Weight:       159.4 lb Date of Birth:  02-16-1966    BSA:          1.816 m Patient Age:    55 years     BP:           127/92 mmHg Patient Gender: F            HR:           77 bpm. Exam Location:  Inpatient  Procedure: Transesophageal Echo, Cardiac Doppler and Color Doppler  Indications:     Atrial  Flutter  History:         Patient has prior history of Echocardiogram examinations, most recent 12/25/2020.  Sonographer:     Margreta Journey RDCS Referring Phys:  [id number] Beatriz Stallion Diagnosing Phys: Thomasene Ripple DO  PROCEDURE: After discussion of the risks and benefits of a TEE, an informed consent was obtained from the patient. The transesophogeal probe was passed without difficulty through the esophogus of the patient. Imaged were obtained with the patient in a left lateral decubitus position. Local oropharyngeal anesthetic was provided with viscous lidocaine. Sedation performed by different physician. The patient was monitored while under deep sedation. Anesthestetic sedation was provided intravenously by Anesthesiology: 298mg  of Propofol. Image quality was good. The patient developed no complications during the procedure.  IMPRESSIONS   1. Left ventricular ejection fraction, by estimation, is 55 to 60%. The left ventricle has normal function. 2. Right ventricular systolic function is normal. The right ventricular size is normal. 3. A left atrial/left atrial appendage thrombus was detected. The LAA emptying velocity was 22 cm/s. 4. The mitral valve is normal in structure. Mild to moderate mitral valve regurgitation. 5. The aortic valve is tricuspid. Aortic valve regurgitation is moderate.  FINDINGS Left Ventricle: Left ventricular ejection fraction, by estimation, is 55 to 60%. The left ventricle has normal function. The left ventricular internal cavity size was normal in size.  Right Ventricle: The right ventricular size is normal. No increase in right ventricular wall thickness. Right ventricular systolic function is normal.  Left Atrium: Left atrial size was not well visualized. Spontaneous echo contrast was present in the left atrial appendage. A left atrial/left atrial appendage thrombus was detected. The LAA emptying velocity was 22 cm/s.  Right Atrium: Right atrial size  was not well visualized.  Pericardium: There is no evidence of pericardial effusion.  Mitral Valve: The mitral valve is normal in structure. Mild to moderate mitral valve regurgitation.  Tricuspid Valve: The tricuspid valve is normal in structure. Tricuspid valve regurgitation is trivial.  Aortic Valve: The aortic valve is tricuspid. Aortic valve regurgitation is moderate.  Pulmonic Valve: The pulmonic valve was normal in structure. Pulmonic  valve regurgitation is trivial.  Aorta: The aortic root is normal in size and structure.  IAS/Shunts: No atrial level shunt detected by color flow Doppler.  Lavona Mound Tobb DO Electronically signed by Thomasene Ripple DO Signature Date/Time: 12/27/2020/5:43:49 PM    Final            Risk Assessment/Calculations:    CHA2DS2-VASc Score = 6   This indicates a 9.7% annual risk of stroke. The patient's score is based upon: CHF History: 1 HTN History: 1 Diabetes History: 0 Stroke History: 2 Vascular Disease History: 1 Age Score: 0 Gender Score: 1            Physical Exam:   VS:  BP 118/80   Pulse 98   Ht 5\' 6"  (1.676 m)   Wt 191 lb (86.6 kg)   LMP 04/24/2013 Comment: states periods come and go  BMI 30.83 kg/m    Wt Readings from Last 3 Encounters:  10/27/22 191 lb (86.6 kg)  10/13/22 191 lb 12.8 oz (87 kg)  09/10/22 194 lb 9.6 oz (88.3 kg)    GEN: Well nourished, well developed in no acute distress NECK: No JVD; No carotid bruits CARDIAC: IRIR, tachycardic, no murmurs, rubs, gallops RESPIRATORY:  Clear to auscultation without rales, wheezing or rhonchi  ABDOMEN: Soft, non-tender, non-distended EXTREMITIES:  No edema; No deformity   ASSESSMENT AND PLAN: .    Preop - Pending L5-S1 ESI with Dr. Lorrine Kin. Per AHA/ACC guidelines, she is deemed acceptable risk for the planned procedure without additional cardiovascular testing. .  Per pharmacy review and discussion with Dr. Cristal Deer (DOD) today, she may hold Xarelto 3 days prior to  procedure with prompt resumption of therapy post-procedure.   Atrial flutter / Atrial fibrillation / LAA thrombus / Fatigue - Ablative therapy in 2018. Admission 12/2020 with recurrent atrial flutter and LAA thrombus unable to be cardioverted.  Plan for rate control since that time. Rate controlled by auscultation today. GDMT 360mg  every day, Xarelto 20mg  daily. Avoid beta-blocker due to cocaine use though if rate persistently difficult to control could trial labetolol or carvedilol with addition of vasodilator. CHA2DS2-VASc Score = 6 [CHF History: 1, HTN History: 1, Diabetes History: 0, Stroke History: 2, Vascular Disease History: 1, Age Score: 0, Gender Score: 1].  Therefore, the patient's annual risk of stroke is 9.7 %.    Denies bleeding complications - 09/10/22 Hb 13.6.     HTN - BP well controlled. Continue current antihypertensive regimen.     Cocaine use / Tobacco use- Smoking 0.5 PPD feeling a bit overwhelmed by trying to quit as others in her home smoke and significant stressors with back pain. Discussed readdressing readiness to quit after her back injection. Consider referral to health coaching at that time. No cocaine use in 2-3 weeks - continued cessation encouraged.   Prediabetes - 09/10/22 A1c 6.7. Recommend to gradually increase physical activity (presently limited by back pain) and low carbohydrate diet.    CKDIIIa - Careful titration of diuretic and antihypertensive.  Follows with [facility] Dr. Ronalee Belts.    Diastolic heart failure - Euvolemic and well compensated on exam. GDMT Lasix 40mg  daily, Spironolactone. No beta blocker due to cocaine use. Could consider SGLT2i at follow up but may be cost prohibitive. As euvolemic today, will defer. Entresto previously precluded by hypotension - readdress at follow up. Low sodium diet, fluid restriction <2L, and daily weights encouraged. Educated to contact our office for weight gain of 2 lbs overnight or 5 lbs in one  week.    COPD -  Continue to follow with PCP. No evidence of acute exacerbation. Dyspnea improved with Spiriva, not requiring PRN albuterol.   Aortic atherosclerosis / Coronary calcification on CT / HLD - 09/10/22 LDL 91. Continue Atorvastatin 40mg  daily. No ASA due to [facility]. Stable with no anginal symptoms. No indication for ischemic evaluation.  Notes was not taking atorvastatin consistently time of last labs.  She will be consistent in taking over the next 2 to 3 months and repeat lipid panel at that time. Lipid lab orders provided today.        Dispo: follow up in 4-6 weeks  Signed, Alver Sorrow, NP

## 2022-11-12 ENCOUNTER — Other Ambulatory Visit: Payer: Self-pay

## 2022-11-12 MED ORDER — OMEPRAZOLE 40 MG PO CPDR
40.0000 mg | DELAYED_RELEASE_CAPSULE | Freq: Every day | ORAL | 3 refills | Status: AC
  Filled 2022-11-12 – 2022-12-08 (×2): qty 90, 90d supply, fill #0
  Filled 2023-04-23: qty 90, 90d supply, fill #1
  Filled 2023-07-26: qty 90, 90d supply, fill #2
  Filled 2023-10-21: qty 90, 90d supply, fill #3

## 2022-11-17 ENCOUNTER — Other Ambulatory Visit: Payer: Self-pay

## 2022-11-17 ENCOUNTER — Other Ambulatory Visit (HOSPITAL_BASED_OUTPATIENT_CLINIC_OR_DEPARTMENT_OTHER): Payer: Self-pay | Admitting: Family

## 2022-11-17 DIAGNOSIS — E876 Hypokalemia: Secondary | ICD-10-CM

## 2022-11-17 DIAGNOSIS — I110 Hypertensive heart disease with heart failure: Secondary | ICD-10-CM

## 2022-11-17 DIAGNOSIS — I4819 Other persistent atrial fibrillation: Secondary | ICD-10-CM

## 2022-11-17 MED ORDER — SPIRONOLACTONE 25 MG PO TABS
25.0000 mg | ORAL_TABLET | Freq: Every day | ORAL | 1 refills | Status: DC
  Filled 2022-11-17 – 2022-12-08 (×2): qty 90, 90d supply, fill #0
  Filled 2023-04-23: qty 90, 90d supply, fill #1

## 2022-11-17 MED ORDER — POTASSIUM CHLORIDE CRYS ER 20 MEQ PO TBCR
20.0000 meq | EXTENDED_RELEASE_TABLET | Freq: Every day | ORAL | 1 refills | Status: DC
  Filled 2022-11-17 – 2022-12-08 (×2): qty 90, 90d supply, fill #0
  Filled 2023-04-23: qty 90, 90d supply, fill #1

## 2022-11-17 NOTE — Telephone Encounter (Signed)
Rx request sent to pharmacy.  

## 2022-11-19 ENCOUNTER — Ambulatory Visit: Payer: Medicaid Other | Admitting: Family Medicine

## 2022-11-19 ENCOUNTER — Ambulatory Visit: Payer: Medicaid Other

## 2022-11-25 ENCOUNTER — Other Ambulatory Visit: Payer: Self-pay

## 2022-12-02 ENCOUNTER — Ambulatory Visit (INDEPENDENT_AMBULATORY_CARE_PROVIDER_SITE_OTHER): Payer: MEDICAID | Admitting: Gastroenterology

## 2022-12-02 ENCOUNTER — Encounter: Payer: Self-pay | Admitting: Gastroenterology

## 2022-12-02 ENCOUNTER — Other Ambulatory Visit: Payer: Self-pay

## 2022-12-02 VITALS — BP 170/80 | HR 100 | Ht 66.0 in | Wt 196.0 lb

## 2022-12-02 DIAGNOSIS — Z1211 Encounter for screening for malignant neoplasm of colon: Secondary | ICD-10-CM | POA: Diagnosis not present

## 2022-12-02 DIAGNOSIS — Z7901 Long term (current) use of anticoagulants: Secondary | ICD-10-CM

## 2022-12-02 MED ORDER — NA SULFATE-K SULFATE-MG SULF 17.5-3.13-1.6 GM/177ML PO SOLN
1.0000 | Freq: Once | ORAL | 0 refills | Status: AC
  Filled 2022-12-02: qty 354, 30d supply, fill #0
  Filled 2022-12-14: qty 354, 2d supply, fill #0

## 2022-12-02 NOTE — Progress Notes (Signed)
12/02/2022 Morgan Bean [id number] 1966/02/28   HISTORY OF PRESENT ILLNESS:  This is a 57 year old female who is new to our office.  PMH listed below.  She is here today at the request of her PCP, Dr. Alvis Lemmings, to schedule colonoscopy.  She's never had one in the past.  She has history of atrial fibrillation/flutter and follows with Dr. Duke Salvia for that.  Had approval to hold Xarelto for 3 days for a back injection that she had recently.  She does not see any blood in her stool.  She does not have any family history of colon cancer.  She says that she doe not move her bowels as well as she thinks that she should but says that she moves them daily.  Past Medical History:  Diagnosis Date   Anemia of other chronic disease 11/13/2013   Anginal pain ([name])    none in past year   Anxiety    Asthma    patient denies   Autoimmune disease ([name]) 12/27/2018   Candidiasis 06/23/2013   Chronic diastolic CHF (congestive heart failure) ([name])    Constipation    Coronary artery disease    Lexiscan myoview (10/12) with significant ST depression upon Lexiscan injection but no ischemia or infarction on perfusion images. Left heart cath (10/12): 30% mid LAD, 30% ostial D1, 50% ostial D2.     Cough 11/13/2013   Depression    Headache 02/22/2014   Hx of cardiovascular stress test    Lexiscan Myoview (2/16): Breast attenuation artifact, no ischemia, EF 64%; low risk   Hyperlipemia    Hypertension    Iron deficiency    hx of   Leukocytoclastic vasculitis ([name])    Rash across lower body, occurred in 9/11, diagnosed by skin biopsy. ANA positive. Thought to be secondary to cocaine use.   Leukopenia 03/21/2013   Lymphadenopathy 03/21/2013   Lymphadenopathy 01/30/2014   Mental disorder    Pancytopenia ([name])    Pancytopenia, acquired ([name]) 07/16/2015   Polysubstance abuse ([name])    Prior cocaine   Pulmonary HTN ([name])    Echo (9/11) with EF 65%, mild LVH, mild AI, mild MR, moderate to possibly severe TR  with PA systolic pressure 52 mmHg. Echo (10/12): severe LV hypertrophy, EF 60-65%, mild MR, mild AI, moderate to severe tricuspid regurgitation, PA systolic pressure 38 mmHg.  Echo 4/14: moderate LVH, EF 65%, normal wall motion, diastolic dysfunction, mild AI, mild MR   Shortness of breath    a. PFTs 5/14:  FEV1/FVC 99% predicted; FVC 60% predicted; DLCO mildly reduced, mild restriction, air trapping.;  b.  seen by pulmo (Dr. Shelle Iron) 5/14: mainly upper airway symptoms - ACE d/c'd (sx's better)   Stroke [facility]) 2010ish   Syphilis 04/25/2013   Tobacco abuse    Tricuspid regurgitation    Unspecified deficiency anemia 03/29/2013   Past Surgical History:  Procedure Laterality Date   ANKLE SURGERY     right   BONE MARROW ASPIRATION     CARDIAC CATHETERIZATION     CARDIAC CATHETERIZATION N/A 04/23/2015   Procedure: Right Heart Cath;  Surgeon: Laurey Morale, MD;  Location: [facility] INVASIVE CV LAB;  Service: Cardiovascular;  Laterality: N/A;   CARDIOVERSION N/A 03/11/2018   Procedure: CARDIOVERSION;  Surgeon: Pricilla Riffle, MD;  Location: [facility] ENDOSCOPY;  Service: Cardiovascular;  Laterality: N/A;   I & D EXTREMITY  08/09/2011   Procedure: IRRIGATION AND DEBRIDEMENT EXTREMITY;  Surgeon: Robyne Askew, MD;  Location:  [facility] OR;  Service: General;  Laterality: Left;  i & D Left axilla abscess   LYMPH NODE BIOPSY N/A 04/05/2013   Procedure: EXCISIONAL BIOPSY RIGHT SUBMANDIBULAR NODE, NASAL ENDOSCOPIC WITH BIOPSY NASAL PHARYNX;  Surgeon: Flo Shanks, MD;  Location[facility] OR;  Service: ENT;  Laterality: N/A;   MULTIPLE EXTRACTIONS WITH ALVEOLOPLASTY N/A 03/03/2019   Procedure: MULTIPLE EXTRACTION WITH ALVEOLOPLASTY;  Surgeon: Ocie Doyne, DDS;  Location: [facility] OR;  Service: Oral Surgery;  Laterality: N/A;   TEE WITHOUT CARDIOVERSION N/A 03/11/2018   Procedure: TRANSESOPHAGEAL ECHOCARDIOGRAM (TEE);  Surgeon: Pricilla Riffle, MD;  Location: [facility] ENDOSCOPY;  Service: Cardiovascular;  Laterality: N/A;   TEE WITHOUT  CARDIOVERSION N/A 12/27/2020   Procedure: TRANSESOPHAGEAL ECHOCARDIOGRAM (TEE);  Surgeon: Thomasene Ripple, DO;  Location: [facility] ENDOSCOPY;  Service: Cardiovascular;  Laterality: N/A;    reports that she has quit smoking. Her smoking use included cigarettes. She has a 19.5 pack-year smoking history. She has never used smokeless tobacco. She reports that she does not currently use alcohol. She reports current drug use. Frequency: 7.00 times per week. Drug: "Crack" cocaine. family history includes Asthma in her grandson; Breast cancer in her maternal grandmother; Coronary artery disease in her father and mother; Diabetes in her father, maternal grandmother, and mother; Heart attack in her father and maternal grandmother; Hypertension in her father; Stroke in her maternal grandmother. Allergies  Allergen Reactions   Ciprofloxacin Itching      Outpatient Encounter Medications as of 12/02/2022  Medication Sig   atorvastatin (LIPITOR) 40 MG tablet Take 1 tablet (40 mg total) by mouth at bedtime.   Cholecalciferol (VITAMIN D) 50 MCG (2000 UT) CAPS Take 1 capsule (2,000 Units total) by mouth daily.   diltiazem (CARDIZEM CD) 360 MG 24 hr capsule Take 1 capsule (360 mg total) by mouth daily.   fluticasone (FLONASE) 50 MCG/ACT nasal spray Place 2 sprays into both nostrils daily.   furosemide (LASIX) 40 MG tablet Take 1 tablet (40 mg total) by mouth daily.   hydrOXYzine (ATARAX) 50 MG tablet Take 1 tablet (50 mg total) by mouth every 8 (eight) hours as needed.   lisinopril (ZESTRIL) 5 MG tablet Take 1 tablet (5 mg total) by mouth daily.   Na Sulfate-K Sulfate-Mg Sulf 17.5-3.13-1.6 GM/177ML SOLN Take 1 kit by mouth once for 1 dose.   omeprazole (PRILOSEC) 40 MG capsule Take 1 capsule (40 mg total) by mouth daily.   potassium chloride SA (KLOR-CON M) 20 MEQ tablet Take 1 tablet (20 mEq total) by mouth daily.   rivaroxaban (XARELTO) 20 MG TABS tablet Take 1 tablet (20 mg total) by mouth daily with supper.   sodium  bicarbonate 650 MG tablet Take 1 tablet (650 mg total) by mouth 2 (two) times daily.   spironolactone (ALDACTONE) 25 MG tablet Take 1 tablet (25 mg total) by mouth once daily.   tiotropium (SPIRIVA HANDIHALER) 18 MCG inhalation capsule Place 1 capsule (18 mcg total) into inhaler and inhale daily.   albuterol (VENTOLIN HFA) 108 (90 Base) MCG/ACT inhaler Inhale 2 puffs into the lungs every 6 (six) hours as needed for wheezing or shortness of breath. (Patient not taking: Reported on 12/02/2022)   ferrous sulfate 325 (65 FE) MG tablet Take 325 mg by mouth daily with breakfast. (Patient not taking: Reported on 12/02/2022)   rivaroxaban (XARELTO) 20 MG TABS tablet Take 1 tablet (20 mg total) by mouth daily with supper. (Patient not taking: Reported on 12/02/2022)   No facility-administered encounter medications on file as  of 12/02/2022.    REVIEW OF SYSTEMS  : All other systems reviewed and negative except where noted in the History of Present Illness.   PHYSICAL EXAM: BP (!) 170/80   Pulse 100   Ht 5\' 6"  (1.676 m)   Wt 196 lb (88.9 kg)   LMP 04/24/2013 Comment: states periods come and go  BMI 31.64 kg/m  General: Well developed female in no acute distress Head: Normocephalic and atraumatic Eyes:  Sclerae anicteric, conjunctiva pink. Ears: Normal auditory acuity Lungs: Clear throughout to auscultation; no W/R/R. Heart: Regular rate and rhythm; no M/R/G. Rectal:  Will be done at the time of colonoscopy. Musculoskeletal: Symmetrical with no gross deformities  Skin: No lesions on visible extremities Extremities: No edema  Neurological: Alert oriented x 4, grossly non-focal Psychological:  Alert and cooperative. Normal mood and affect  ASSESSMENT AND PLAN: *CRC screening:  She has never had a colonoscopy in the past.  Will schedule with Dr. Tomasa Rand. *Chronic anticoagulation with Xarelto for history of atrial fibrillation:  Will hold Xarelto for 2 days prior to endoscopic procedures - will  instruct when and how to resume after procedure. Benefits and risks of procedure explained including risks of bleeding, perforation, infection, missed lesions, reactions to medications and possible need for hospitalization and surgery for complications. Additional rare but real risk of stroke or other vascular clotting events off of Xarelto also explained and need to seek urgent help if any signs of these problems occur. Will communicate by phone or EMR with patient's prescribing provider, Dr. Duke Salvia, to confirm that holding Xarelto is reasonable in this case.    CC:  Hoy Register, MD

## 2022-12-02 NOTE — Patient Instructions (Signed)
You will be contacted by our office prior to your procedure for directions on holding your Plavix.  If you do not hear from our office 1 week prior to your scheduled procedure, please call [phone] to discuss.   You have been scheduled for a colonoscopy. Please follow written instructions given to you at your visit today.   Please pick up your prep supplies at the pharmacy within the next 1-3 days.  If you use inhalers (even only as needed), please bring them with you on the day of your procedure.  DO NOT TAKE 7 DAYS PRIOR TO TEST- Trulicity (dulaglutide) Ozempic, Wegovy (semaglutide) Mounjaro (tirzepatide) Bydureon Bcise (exanatide extended release)  DO NOT TAKE 1 DAY PRIOR TO YOUR TEST Rybelsus (semaglutide) Adlyxin (lixisenatide) Victoza (liraglutide) Byetta (exanatide)  _______________________________________________________  If your blood pressure at your visit was 140/90 or greater, please contact your primary care physician to follow up on this.  _______________________________________________________  If you are age 59 or older, your body mass index should be between 23-30. Your Body mass index is 31.64 kg/m. If this is out of the aforementioned range listed, please consider follow up with your Primary Care Provider.  If you are age 50 or younger, your body mass index should be between 19-25. Your Body mass index is 31.64 kg/m. If this is out of the aformentioned range listed, please consider follow up with your Primary Care Provider.   ________________________________________________________  The [facility] [facility] providers would like to encourage you to use [facility] to communicate with providers for non-urgent requests or questions.  Due to long hold times on the telephone, sending your provider a message by [facility] may be a faster and more efficient way to get a response.  Please allow 48 business hours for a response.  Please remember that this is for non-urgent requests.   _______________________________________________________

## 2022-12-04 NOTE — Progress Notes (Signed)
Agree with the assessment and plan as outlined by Doug Sou, PA-C.  If patient is still using cocaine, she should be advised that her procedure will be cancelled if she has used cocaine recently, due to the possibility of cardiovascular complications from sedation while under the influence of cocaine.    Venna Berberich E. Tomasa Rand, MD  [facility]

## 2022-12-04 NOTE — Progress Notes (Signed)
Patient was informed no drug use within 72 hours prior to procedure. Patient voiced understanding.

## 2022-12-08 ENCOUNTER — Other Ambulatory Visit: Payer: Self-pay

## 2022-12-11 ENCOUNTER — Telehealth: Payer: Self-pay | Admitting: *Deleted

## 2022-12-11 NOTE — Telephone Encounter (Signed)
Morgan Bean,   You saw this patient on 10/27/2022. Per office protocol, will you please comment on medical clearance for colonoscopy on 01/05/2023?  Please route your response to P CV DIV Preop. I will communicate with requesting office once you have given recommendations.   Thank you!  Carlos Levering, NP

## 2022-12-11 NOTE — Telephone Encounter (Signed)
Please advise holding Xarelto prior to colonoscopy on 01/05/2023.  Thank you!  DW

## 2022-12-11 NOTE — Telephone Encounter (Signed)
[facility] Pre-operative Risk Assessment     Request for surgical clearance:     Endoscopy Procedure  What type of surgery is being performed?     colonoscopy  When is this surgery scheduled?     01/05/23  What type of clearance is required ?   Pharmacy  Are there any medications that need to be held prior to surgery and how long? Xarelto 2 days  Practice name and name of physician performing surgery?      [facility]  What is your office phone and fax number?      Phone- [phone]  Fax- [phone]  Anesthesia type (None, local, MAC, general) ?       MAC

## 2022-12-14 ENCOUNTER — Other Ambulatory Visit: Payer: Self-pay

## 2022-12-14 NOTE — Telephone Encounter (Signed)
Exercise tolerance >4 METS. Per AHA/ACC guidelines, she is deemed acceptable risk for the planned procedure without additional cardiovascular testing.  Alver Sorrow, NP

## 2022-12-14 NOTE — Telephone Encounter (Signed)
Patient with diagnosis of afib on Xarelto for anticoagulation.    Procedure: colonoscopy Date of procedure: 01/05/23  CHA2DS2-VASc Score = 6  This indicates a 9.7% annual risk of stroke. 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 No thrombus noted on 03/2021 echo.  CrCl Platelet count 212K  Per office protocol, patient can hold Xarelto for 1-2 days prior to procedure. Resume as soon as safely possible after given elevated CV risk.  **This guidance is not considered finalized until pre-operative APP has relayed final recommendations.**

## 2022-12-14 NOTE — Telephone Encounter (Signed)
Name: Morgan Bean  DOB: 1965/08/11  MRN: [id number]   Primary Cardiologist: Chilton Si, MD  Chart reviewed as part of pre-operative protocol coverage. Makailyn Fedele was last seen on 10/27/2022 by Gillian Shields, NP.  Per Luther Parody, "Exercise tolerance >4 METS. Per AHA/ACC guidelines, she is deemed acceptable risk for the planned procedure without additional cardiovascular testing."   Per Pharm D, patient may hold Xarelto for 1-2 days prior to procedure. Resume as soon as safely possible after given elevated CV risk.     I will route this recommendation to the requesting party via Epic fax function and remove from pre-op pool. Please call with questions.  Carlos Levering, NP 12/14/2022, 1:49 PM

## 2022-12-25 ENCOUNTER — Other Ambulatory Visit: Payer: Self-pay | Admitting: Family Medicine

## 2022-12-25 DIAGNOSIS — Z1212 Encounter for screening for malignant neoplasm of rectum: Secondary | ICD-10-CM

## 2022-12-25 DIAGNOSIS — Z1211 Encounter for screening for malignant neoplasm of colon: Secondary | ICD-10-CM

## 2023-01-05 ENCOUNTER — Telehealth: Payer: Self-pay | Admitting: Gastroenterology

## 2023-01-05 ENCOUNTER — Encounter: Payer: MEDICAID | Admitting: Gastroenterology

## 2023-01-05 NOTE — Telephone Encounter (Signed)
Hi Dr Tomasa Rand,   I called patient twice regarding her procedure appointment, she did not answer and I was unable to leave a voice message due to her mail box being full. I will no show patient for today's appointment.  Thank you

## 2023-01-20 ENCOUNTER — Other Ambulatory Visit (HOSPITAL_BASED_OUTPATIENT_CLINIC_OR_DEPARTMENT_OTHER): Payer: Self-pay | Admitting: Family

## 2023-01-20 DIAGNOSIS — I4819 Other persistent atrial fibrillation: Secondary | ICD-10-CM

## 2023-01-20 DIAGNOSIS — I10 Essential (primary) hypertension: Secondary | ICD-10-CM

## 2023-01-20 MED ORDER — LISINOPRIL 5 MG PO TABS
5.0000 mg | ORAL_TABLET | Freq: Every day | ORAL | 5 refills | Status: DC
Start: 2023-01-20 — End: 2023-10-25
  Filled 2023-01-20: qty 30, 30d supply, fill #0
  Filled 2023-02-16: qty 30, 30d supply, fill #1
  Filled 2023-04-23: qty 30, 30d supply, fill #2
  Filled 2023-06-02 – 2023-07-13 (×4): qty 30, 30d supply, fill #3
  Filled 2023-07-26 – 2023-08-30 (×3): qty 30, 30d supply, fill #4
  Filled 2023-10-04: qty 30, 30d supply, fill #5

## 2023-01-21 ENCOUNTER — Other Ambulatory Visit (HOSPITAL_COMMUNITY): Payer: Self-pay

## 2023-01-21 ENCOUNTER — Other Ambulatory Visit: Payer: Self-pay

## 2023-01-22 ENCOUNTER — Other Ambulatory Visit (HOSPITAL_COMMUNITY): Payer: Self-pay

## 2023-02-16 ENCOUNTER — Other Ambulatory Visit (HOSPITAL_COMMUNITY): Payer: Self-pay

## 2023-02-17 ENCOUNTER — Other Ambulatory Visit: Payer: Self-pay

## 2023-02-17 ENCOUNTER — Other Ambulatory Visit (HOSPITAL_COMMUNITY): Payer: Self-pay

## 2023-02-17 MED ORDER — FLUTICASONE PROPIONATE 50 MCG/ACT NA SUSP
2.0000 | Freq: Every day | NASAL | 11 refills | Status: AC
  Filled 2023-02-17 – 2023-04-23 (×2): qty 16, 30d supply, fill #0
  Filled 2023-06-02 – 2023-07-13 (×4): qty 16, 30d supply, fill #1
  Filled 2023-08-30: qty 16, 30d supply, fill #2
  Filled 2023-10-04: qty 16, 30d supply, fill #3
  Filled 2024-01-13 – 2024-02-16 (×2): qty 16, 30d supply, fill #4

## 2023-02-26 ENCOUNTER — Ambulatory Visit (HOSPITAL_BASED_OUTPATIENT_CLINIC_OR_DEPARTMENT_OTHER): Payer: MEDICAID | Admitting: Family

## 2023-02-26 ENCOUNTER — Other Ambulatory Visit (HOSPITAL_COMMUNITY): Payer: Self-pay

## 2023-04-06 ENCOUNTER — Ambulatory Visit: Payer: MEDICAID | Admitting: Family Medicine

## 2023-04-06 ENCOUNTER — Encounter: Payer: Self-pay | Admitting: Family Medicine

## 2023-04-23 ENCOUNTER — Other Ambulatory Visit (HOSPITAL_BASED_OUTPATIENT_CLINIC_OR_DEPARTMENT_OTHER): Payer: Self-pay

## 2023-04-23 ENCOUNTER — Other Ambulatory Visit: Payer: Self-pay | Admitting: Family Medicine

## 2023-04-23 ENCOUNTER — Other Ambulatory Visit: Payer: Self-pay

## 2023-04-23 ENCOUNTER — Other Ambulatory Visit (HOSPITAL_BASED_OUTPATIENT_CLINIC_OR_DEPARTMENT_OTHER): Payer: Self-pay | Admitting: Family

## 2023-04-23 ENCOUNTER — Other Ambulatory Visit (HOSPITAL_BASED_OUTPATIENT_CLINIC_OR_DEPARTMENT_OTHER): Payer: Self-pay | Admitting: Cardiovascular Disease

## 2023-04-23 ENCOUNTER — Other Ambulatory Visit (HOSPITAL_COMMUNITY): Payer: Self-pay

## 2023-04-23 DIAGNOSIS — F419 Anxiety disorder, unspecified: Secondary | ICD-10-CM

## 2023-04-23 MED ORDER — FUROSEMIDE 40 MG PO TABS
40.0000 mg | ORAL_TABLET | Freq: Every day | ORAL | 0 refills | Status: DC
  Filled 2023-04-23 (×2): qty 90, 90d supply, fill #0

## 2023-04-23 MED ORDER — HYDROXYZINE HCL 50 MG PO TABS
50.0000 mg | ORAL_TABLET | Freq: Three times a day (TID) | ORAL | 0 refills | Status: DC | PRN
  Filled 2023-04-23: qty 30, 10d supply, fill #0

## 2023-04-23 MED ORDER — DILTIAZEM HCL ER COATED BEADS 360 MG PO CP24
360.0000 mg | ORAL_CAPSULE | Freq: Every day | ORAL | 0 refills | Status: DC
  Filled 2023-04-23 (×2): qty 90, 90d supply, fill #0

## 2023-04-23 NOTE — Telephone Encounter (Signed)
Requested Prescriptions  Pending Prescriptions Disp Refills   hydrOXYzine (ATARAX) 50 MG tablet 30 tablet 0    Sig: Take 1 tablet (50 mg total) by mouth every 8 (eight) hours as needed.     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 [facility] Hoy Register, MD   11 months ago Chronic obstructive pulmonary disease, unspecified COPD type [facility])   [facility] [place]. [facility] Hoy Register, MD   1 year ago Annual physical exam   [facility] Dept Of [place]. [facility] Hoy Register, MD   1 year ago Bilateral carpal tunnel syndrome   [facility] - A Dept Of [place]. [facility] Hoy Register, MD   1 year ago COVID-19 virus infection   [facility] - A Dept Of [place]. [facility] [place], Marzella Schlein, [place]       Future Appointments             In 4 days Hoy Register, MD [facility] - A Dept Of [place]. [facility]   In 1 month Dan Humphreys, Storm Frisk, NP [facility] at [facility], [place]

## 2023-04-24 ENCOUNTER — Other Ambulatory Visit: Payer: Self-pay

## 2023-04-27 ENCOUNTER — Ambulatory Visit: Payer: MEDICAID | Attending: Family Medicine | Admitting: Family Medicine

## 2023-04-27 ENCOUNTER — Other Ambulatory Visit (HOSPITAL_COMMUNITY): Payer: Self-pay

## 2023-04-27 ENCOUNTER — Encounter: Payer: Self-pay | Admitting: Family Medicine

## 2023-04-27 VITALS — BP 154/104 | HR 97 | Ht 66.0 in | Wt 185.8 lb

## 2023-04-27 DIAGNOSIS — F17210 Nicotine dependence, cigarettes, uncomplicated: Secondary | ICD-10-CM

## 2023-04-27 DIAGNOSIS — I4891 Unspecified atrial fibrillation: Secondary | ICD-10-CM

## 2023-04-27 DIAGNOSIS — F419 Anxiety disorder, unspecified: Secondary | ICD-10-CM

## 2023-04-27 DIAGNOSIS — I110 Hypertensive heart disease with heart failure: Secondary | ICD-10-CM

## 2023-04-27 DIAGNOSIS — R7303 Prediabetes: Secondary | ICD-10-CM | POA: Diagnosis not present

## 2023-04-27 DIAGNOSIS — Z1211 Encounter for screening for malignant neoplasm of colon: Secondary | ICD-10-CM

## 2023-04-27 DIAGNOSIS — J4489 Other specified chronic obstructive pulmonary disease: Secondary | ICD-10-CM | POA: Diagnosis not present

## 2023-04-27 DIAGNOSIS — M5416 Radiculopathy, lumbar region: Secondary | ICD-10-CM

## 2023-04-27 DIAGNOSIS — I5042 Chronic combined systolic (congestive) and diastolic (congestive) heart failure: Secondary | ICD-10-CM

## 2023-04-27 MED ORDER — ALBUTEROL SULFATE HFA 108 (90 BASE) MCG/ACT IN AERS
2.0000 | INHALATION_SPRAY | Freq: Four times a day (QID) | RESPIRATORY_TRACT | 2 refills | Status: AC | PRN
  Filled 2023-04-27: qty 18, 25d supply, fill #0
  Filled 2024-02-16: qty 6.7, 25d supply, fill #0
  Filled 2024-03-22: qty 6.7, 25d supply, fill #1
  Filled 2024-04-20: qty 6.7, 25d supply, fill #2

## 2023-04-27 MED ORDER — TIOTROPIUM BROMIDE MONOHYDRATE 18 MCG IN CAPS
18.0000 ug | ORAL_CAPSULE | Freq: Every day | RESPIRATORY_TRACT | 6 refills | Status: DC
  Filled 2023-04-27 – 2023-07-13 (×4): qty 30, 30d supply, fill #0
  Filled 2023-08-30: qty 30, 30d supply, fill #1
  Filled 2023-10-04: qty 30, 30d supply, fill #2

## 2023-04-27 MED ORDER — NICOTINE 21 MG/24HR TD PT24
21.0000 mg | MEDICATED_PATCH | Freq: Every day | TRANSDERMAL | 0 refills | Status: DC
  Filled 2023-04-27 – 2023-07-13 (×4): qty 28, 28d supply, fill #0

## 2023-04-27 MED ORDER — HYDROXYZINE HCL 50 MG PO TABS
50.0000 mg | ORAL_TABLET | Freq: Three times a day (TID) | ORAL | 0 refills | Status: DC | PRN
Start: 2023-04-27 — End: 2023-10-25
  Filled 2023-04-27 – 2023-08-30 (×2): qty 30, 10d supply, fill #0

## 2023-04-27 NOTE — Progress Notes (Signed)
 Subjective:  Patient ID: Morgan Bean, female    DOB: 1965/11/21  Age: 58 y.o. MRN: [id number]  CC: Back Pain   HPI Morgan Bean is a 58 y.o. year old female with a history of Hypertension, paroxysmal Afib, A-flutter (status post ablation) CAD, previous CVA, moderate to severe MR, HFpEF, anxiety and depression, polysubstance abuse, Nicotine  dependence (2 ppd since the age of 28, 0.5 ppd over the last few years), CKD, emphysema.   Interval History: Discussed the use of AI scribe software for clinical note transcription with the patient, who gave verbal consent to proceed.  She presents for a follow-up visit. She reports that her anxiety medication, hydroxyzine , has run out and she has been without it for a while. The patient states that she believes her anxiety affects her breathing. The patient has recently experienced significant personal loss, with the passing of her father and baby brother. She is not currently seeing anyone for counseling and expresses a preference for managing her emotional distress with her anxiety medication.She has been using an inhaler, Spiriva , regularly for her emphysema and reports that it is working well.  The patient has also been experiencing back pain and has seen an orthopedic specialist for this issue. She received a steroid injection for the pain, but it did not provide relief. The patient is seeking a second opinion regarding her back pain.  Pain does not radiate down her lower extremities.  She states she was informed by back specialist with [facility] that it was due to birth defect.   The patient is a long-term smoker, having smoked since she was 58 years old. She reports that she has reduced her smoking from two packs a day to half a pack a day over the years. She expresses a desire to quit smoking and believes that nicotine  patches would be helpful in this effort.     From a Cardiac standpoint she denies presence of pedal edema,  dyspnea, palpitations,lightheadedness, weight gain. Last Cardiology visit was in 09/2022.   Past Medical History:  Diagnosis Date   Anemia of other chronic disease 11/13/2013   Anginal pain ([name])    none in past year   Anxiety    Asthma    patient denies   Autoimmune disease ([name]) 12/27/2018   Candidiasis 06/23/2013   Chronic diastolic CHF (congestive heart failure) ([name])    Constipation    Coronary artery disease    Lexiscan  myoview  (10/12) with significant ST depression upon Lexiscan  injection but no ischemia or infarction on perfusion images. Left heart cath (10/12): 30% mid LAD, 30% ostial D1, 50% ostial D2.     Cough 11/13/2013   Depression    Headache 02/22/2014   Hx of cardiovascular stress test    Lexiscan  Myoview  (2/16): Breast attenuation artifact, no ischemia, EF 64%; low risk   Hyperlipemia    Hypertension    Iron deficiency    hx of   Leukocytoclastic vasculitis ([name])    Rash across lower body, occurred in 9/11, diagnosed by skin biopsy. ANA positive. Thought to be secondary to cocaine use.   Leukopenia 03/21/2013   Lymphadenopathy 03/21/2013   Lymphadenopathy 01/30/2014   Mental disorder    Pancytopenia ([name])    Pancytopenia, acquired ([name]) 07/16/2015   Polysubstance abuse ([name])    Prior cocaine   Pulmonary HTN ([name])    Echo (9/11) with EF 65%, mild LVH, mild AI, mild MR, moderate to possibly severe TR with PA systolic pressure 52 mmHg. Echo (  10/12): severe LV hypertrophy, EF 60-65%, mild MR, mild AI, moderate to severe tricuspid regurgitation, PA systolic pressure 38 mmHg.  Echo 4/14: moderate LVH, EF 65%, normal wall motion, diastolic dysfunction, mild AI, mild MR   Shortness of breath    a. PFTs 5/14:  FEV1/FVC 99% predicted; FVC 60% predicted; DLCO mildly reduced, mild restriction, air trapping.;  b.  seen by pulmo (Dr. Corrie) 5/14: mainly upper airway symptoms - ACE d/c'd (sx's better)   Stroke [facility]) 2010ish   Syphilis 04/25/2013   Tobacco abuse     Tricuspid regurgitation    Unspecified deficiency anemia 03/29/2013    Past Surgical History:  Procedure Laterality Date   ANKLE SURGERY     right   BONE MARROW ASPIRATION     CARDIAC CATHETERIZATION     CARDIAC CATHETERIZATION N/A 04/23/2015   Procedure: Right Heart Cath;  Surgeon: Ezra GORMAN Shuck, MD;  Location: [facility] INVASIVE CV LAB;  Service: Cardiovascular;  Laterality: N/A;   CARDIOVERSION N/A 03/11/2018   Procedure: CARDIOVERSION;  Surgeon: Okey Vina GAILS, MD;  Location: [facility] ENDOSCOPY;  Service: Cardiovascular;  Laterality: N/A;   I & D EXTREMITY  08/09/2011   Procedure: IRRIGATION AND DEBRIDEMENT EXTREMITY;  Surgeon: Deward GORMAN Curvin DOUGLAS, MD;  Location: [facility] OR;  Service: General;  Laterality: Left;  i & D Left axilla abscess   LYMPH NODE BIOPSY N/A 04/05/2013   Procedure: EXCISIONAL BIOPSY RIGHT SUBMANDIBULAR NODE, NASAL ENDOSCOPIC WITH BIOPSY NASAL PHARYNX;  Surgeon: Marlyce Finer, MD;  Location: [facility] OR;  Service: ENT;  Laterality: N/A;   MULTIPLE EXTRACTIONS WITH ALVEOLOPLASTY N/A 03/03/2019   Procedure: MULTIPLE EXTRACTION WITH ALVEOLOPLASTY;  Surgeon: Sheryle Hamilton, DDS;  Location: [facility] OR;  Service: Oral Surgery;  Laterality: N/A;   TEE WITHOUT CARDIOVERSION N/A 03/11/2018   Procedure: TRANSESOPHAGEAL ECHOCARDIOGRAM (TEE);  Surgeon: Okey Vina GAILS, MD;  Location: [facility] ENDOSCOPY;  Service: Cardiovascular;  Laterality: N/A;   TEE WITHOUT CARDIOVERSION N/A 12/27/2020   Procedure: TRANSESOPHAGEAL ECHOCARDIOGRAM (TEE);  Surgeon: Sheena Pugh, DO;  Location: [facility] ENDOSCOPY;  Service: Cardiovascular;  Laterality: N/A;    Family History  Problem Relation Age of Onset   Coronary artery disease Mother    Diabetes Mother    Diabetes Father    Hypertension Father    Coronary artery disease Father    Heart attack Father    Breast cancer Maternal Grandmother    Diabetes Maternal Grandmother    Heart attack Maternal Grandmother    Stroke Maternal Grandmother    Asthma Grandson     Social History    Socioeconomic History   Marital status: Single    Spouse name: Not on file   Number of children: 2   Years of education: Not on file   Highest education level: GED or equivalent  Occupational History   Occupation: unemployed   Occupation: disabled  Tobacco Use   Smoking status: Former    Current packs/day: 0.50    Average packs/day: 0.5 packs/day for 39.0 years (19.5 ttl pk-yrs)    Types: Cigarettes   Smokeless tobacco: Never  Vaping Use   Vaping status: Never Used  Substance and Sexual Activity   Alcohol use: Not Currently    Comment: used in the past   Drug use: Yes    Frequency: 7.0 times per week    Types: Crack cocaine   Sexual activity: Not Currently  Other Topics Concern   Not on file  Social History Narrative   Lives with mom, sisters and brothers.  Social Drivers of Health   Financial Resource Strain: Medium Risk (04/06/2023)   Overall Financial Resource Strain (CARDIA)    Difficulty of Paying Living Expenses: Somewhat hard  Food Insecurity: No Food Insecurity (04/06/2023)   Hunger Vital Sign    Worried About Running Out of Food in the Last Year: Never true    Ran Out of Food in the Last Year: Never true  Transportation Needs: Unmet Transportation Needs (04/06/2023)   PRAPARE - Administrator, Civil Service (Medical): Yes    Lack of Transportation (Non-Medical): No  Physical Activity: Inactive (04/06/2023)   Exercise Vital Sign    Days of Exercise per Week: 0 days    Minutes of Exercise per Session: 10 min  Stress: Stress Concern Present (04/06/2023)   [employer] of Occupational Health - Occupational Stress Questionnaire    Feeling of Stress : Rather much  Social Connections: Moderately Isolated (04/06/2023)   Social Connection and Isolation Panel [NHANES]    Frequency of Communication with Friends and Family: Twice a week    Frequency of Social Gatherings with Friends and Family: Once a week    Attends Religious Services: 1 to 4 times  per year    Active Member of [employer] or Organizations: No    Attends Engineer, Structural: Not on file    Marital Status: Never married    Allergies  Allergen Reactions   Ciprofloxacin Itching    Outpatient Medications Prior to Visit  Medication Sig Dispense Refill   atorvastatin  (LIPITOR ) 40 MG tablet Take 1 tablet (40 mg total) by mouth at bedtime. 30 tablet 11   Cholecalciferol  (VITAMIN D ) 50 MCG (2000 UT) CAPS Take 1 capsule (2,000 Units total) by mouth daily. 30 capsule 6   diltiazem  (CARDIZEM  CD) 360 MG 24 hr capsule Take 1 capsule (360 mg total) by mouth daily. 90 capsule 0   ferrous sulfate  325 (65 FE) MG tablet Take 325 mg by mouth daily with breakfast.     fluticasone  (FLONASE ) 50 MCG/ACT nasal spray Place 2 sprays into both nostrils daily. 16 g 11   furosemide  (LASIX ) 40 MG tablet Take 1 tablet (40 mg total) by mouth daily. 90 tablet 0   lisinopril  (ZESTRIL ) 5 MG tablet Take 1 tablet (5 mg total) by mouth daily. 30 tablet 5   omeprazole  (PRILOSEC) 40 MG capsule Take 1 capsule (40 mg total) by mouth daily. Take with water, wait 30-60 minutes after taking before eating or drinking anything but water 90 capsule 3   potassium chloride  SA (KLOR-CON  M) 20 MEQ tablet Take 1 tablet (20 mEq total) by mouth daily. 90 tablet 1   rivaroxaban  (XARELTO ) 20 MG TABS tablet Take 1 tablet (20 mg total) by mouth daily with supper. 90 tablet 3   rivaroxaban  (XARELTO ) 20 MG TABS tablet Take 1 tablet (20 mg total) by mouth daily with supper.     sodium bicarbonate  650 MG tablet Take 1 tablet (650 mg total) by mouth 2 (two) times daily. 60 tablet 6   spironolactone  (ALDACTONE ) 25 MG tablet Take 1 tablet (25 mg total) by mouth once daily. 90 tablet 1   albuterol  (VENTOLIN  HFA) 108 (90 Base) MCG/ACT inhaler Inhale 2 puffs into the lungs every 6 (six) hours as needed for wheezing or shortness of breath. 18 g 2   hydrOXYzine  (ATARAX ) 50 MG tablet Take 1 tablet (50 mg total) by mouth every 8 (eight)  hours as needed. 30 tablet 0   tiotropium (SPIRIVA  HANDIHALER)  18 MCG inhalation capsule Place 1 capsule (18 mcg total) into inhaler and inhale daily. 30 capsule 6   No facility-administered medications prior to visit.     ROS Review of Systems  Constitutional:  Negative for activity change and appetite change.  HENT:  Negative for sinus pressure and sore throat.   Respiratory:  Negative for chest tightness, shortness of breath and wheezing.   Cardiovascular:  Negative for chest pain and palpitations.  Gastrointestinal:  Negative for abdominal distention, abdominal pain and constipation.  Genitourinary: Negative.   Musculoskeletal:        See HPI  Psychiatric/Behavioral:  Negative for behavioral problems and dysphoric mood. The patient is nervous/anxious.     Objective:  BP (!) 154/104   Pulse 97   Ht 5' 6 (1.676 m)   Wt 185 lb 12.8 oz (84.3 kg)   LMP 04/24/2013 Comment: states periods come and go  SpO2 99%   BMI 29.99 kg/m      04/27/2023   10:57 AM 04/27/2023   10:16 AM 12/02/2022    9:11 AM  BP/Weight  Systolic BP 154 135 170  Diastolic BP 104 104 80  Wt. (Lbs)  185.8   BMI  29.99 kg/m2       Physical Exam Constitutional:      Appearance: She is well-developed.  Cardiovascular:     Rate and Rhythm: Normal rate.     Heart sounds: Normal heart sounds. No murmur heard. Pulmonary:     Effort: Pulmonary effort is normal.     Breath sounds: Normal breath sounds. No wheezing or rales.  Chest:     Chest wall: No tenderness.  Abdominal:     General: Bowel sounds are normal. There is no distension.     Palpations: Abdomen is soft. There is no mass.     Tenderness: There is no abdominal tenderness.  Musculoskeletal:     Right lower leg: No edema.     Left lower leg: No edema.     Comments: Slight lumbar tenderness to palpation  Neurological:     Mental Status: She is alert and oriented to person, place, and time.  Psychiatric:     Comments: Positive for anxiety         Latest Ref Rng & Units 10/13/2022    9:24 AM 09/10/2022    9:26 AM 03/16/2022   10:54 AM  CMP  Glucose 70 - 99 mg/dL 99  91  97   BUN 6 - 24 mg/dL 24  17  33   Creatinine 0.57 - 1.00 mg/dL 8.40  8.60  8.60   Sodium 134 - 144 mmol/L 144  141  142   Potassium 3.5 - 5.2 mmol/L 5.4  4.4  4.1   Chloride 96 - 106 mmol/L 107  107  112   CO2 20 - 29 mmol/L 21  18  22    Calcium  8.7 - 10.2 mg/dL 9.9  9.5  8.9   Total Protein 6.0 - 8.5 g/dL  7.6    Total Bilirubin 0.0 - 1.2 mg/dL  0.4    Alkaline Phos 44 - 121 IU/L  93    AST 0 - 40 IU/L  17    ALT 0 - 32 IU/L  14      Lipid Panel     Component Value Date/Time   CHOL 165 09/10/2022 0926   TRIG 89 09/10/2022 0926   HDL 58 09/10/2022 0926   CHOLHDL 3.2 12/26/2020 0232   VLDL 10  12/26/2020 0232   LDLCALC 91 09/10/2022 0926   LDLDIRECT 142.1 12/25/2010 0846    CBC    Component Value Date/Time   WBC 5.4 09/10/2022 0926   WBC 5.9 03/16/2022 1054   RBC 5.10 09/10/2022 0926   RBC 4.50 03/16/2022 1054   HGB 13.6 09/10/2022 0926   HGB 10.0 (L) 07/16/2015 1407   HCT 42.3 09/10/2022 0926   HCT 31.8 (L) 07/16/2015 1407   PLT 212 09/10/2022 0926   MCV 83 09/10/2022 0926   MCV 78.2 (L) 07/16/2015 1407   MCH 26.7 09/10/2022 0926   MCH 26.7 03/16/2022 1054   MCHC 32.2 09/10/2022 0926   MCHC 31.6 03/16/2022 1054   RDW 13.4 09/10/2022 0926   RDW 14.9 (H) 07/16/2015 1407   LYMPHSABS 2.0 09/10/2022 0926   LYMPHSABS 1.8 07/16/2015 1407   MONOABS 0.4 04/17/2021 0428   MONOABS 0.8 07/16/2015 1407   EOSABS 0.2 09/10/2022 0926   BASOSABS 0.0 09/10/2022 0926   BASOSABS 0.0 07/16/2015 1407    Lab Results  Component Value Date   HGBA1C 5.7 (H) 09/10/2022    Assessment & Plan:      Anxiety Patient reports running out of Hydroxyzine . Pharmacy filled the prescription but patient has not received it yet. -Advise patient to follow up with pharmacy regarding the delivery of Hydroxyzine .  COPD Patient reports using Spiriva  and  Albuterol  regularly. No current complaints of wheezing or coughing. -Continue Spiriva  and Albuterol  as prescribed.  Tobacco Use Patient reports smoking half a pack a day, down from a pack a day. Expressed interest in quitting. -Prescribe nicotine  patches to assist with smoking cessation.  Lumbar radiculopathy/back Pain Patient reports dissatisfaction with current orthopedic management and requests a second opinion. No new symptoms reported. -Refer to a different orthopedic specialist for a second opinion.  Atrial Fibrillation/Heart Failure Patient reports upcoming appointment with cardiologist and is currently on Xarelto  and Lasix . No new symptoms reported. -Continue current management and keep follow-up appointment with cardiologist.   Hypertension Blood pressure above goal and on repeat was elevated above initial reading. -She was emotional during visit and crying due to her recent loss -Will make no regimen changes today but will have her repeat this at her upcoming cardiology visit -Counseled on blood pressure goal of less than 130/80, low-sodium, DASH diet, medication compliance, 150 minutes of moderate intensity exercise per week. Discussed medication compliance, adverse effects.  Prediabetes -A1c of 5.7 from 08/2022 -Will repeat again today  General Health Maintenance -Order blood work. -Order lung CT scan due to high risk from smoking history. -Administer flu and pneumonia vaccines today.          Meds ordered this encounter  Medications   albuterol  (VENTOLIN  HFA) 108 (90 Base) MCG/ACT inhaler    Sig: Inhale 2 puffs into the lungs every 6 (six) hours as needed for wheezing or shortness of breath.    Dispense:  18 g    Refill:  2   hydrOXYzine  (ATARAX ) 50 MG tablet    Sig: Take 1 tablet (50 mg total) by mouth every 8 (eight) hours as needed.    Dispense:  30 tablet    Refill:  0   tiotropium (SPIRIVA  HANDIHALER) 18 MCG inhalation capsule    Sig: Place 1 capsule  (18 mcg total) into inhaler and inhale daily.    Dispense:  30 capsule    Refill:  6   nicotine  (NICODERM CQ ) 21 mg/24hr patch    Sig: Place 1 patch (21 mg total)  onto the skin daily.    Dispense:  28 patch    Refill:  0    Follow-up: Return in about 6 months (around 10/25/2023) for Chronic medical conditions.       Corrina Sabin, MD, FAAFP. [facility] [place], [place] [phone]   04/27/2023, 12:42 PM

## 2023-04-27 NOTE — Patient Instructions (Signed)
 Managing Your Hypertension Hypertension, also called high blood pressure, is when the force of the blood pressing against the walls of the arteries is too strong. Arteries are blood vessels that carry blood from your heart throughout your body. Hypertension forces the heart to work harder to pump blood and may cause the arteries to become narrow or stiff. Understanding blood pressure readings A blood pressure reading includes a higher number over a lower number: The first, or top, number is called the systolic pressure. It is a measure of the pressure in your arteries as your heart beats. The second, or bottom number, is called the diastolic pressure. It is a measure of the pressure in your arteries as the heart relaxes. For most people, a normal blood pressure is below 120/80. Your personal target blood pressure may vary depending on your medical conditions, your age, and other factors. Blood pressure is classified into four stages. Based on your blood pressure reading, your health care provider may use the following stages to determine what type of treatment you need, if any. Systolic pressure and diastolic pressure are measured in a unit called millimeters of mercury (mmHg). Normal Systolic pressure: below 120. Diastolic pressure: below 80. Elevated Systolic pressure: 120-129. Diastolic pressure: below 80. Hypertension stage 1 Systolic pressure: 130-139. Diastolic pressure: 80-89. Hypertension stage 2 Systolic pressure: 140 or above. Diastolic pressure: 90 or above. How can this condition affect me? Managing your hypertension is very important. Over time, hypertension can damage the arteries and decrease blood flow to parts of the body, including the brain, heart, and kidneys. Having untreated or uncontrolled hypertension can lead to: A heart attack. A stroke. A weakened blood vessel (aneurysm). Heart failure. Kidney damage. Eye damage. Memory and concentration problems. Vascular  dementia. What actions can I take to manage this condition? Hypertension can be managed by making lifestyle changes and possibly by taking medicines. Your health care provider will help you make a plan to bring your blood pressure within a normal range. You may be referred for counseling on a healthy diet and physical activity. Nutrition  Eat a diet that is high in fiber and potassium, and low in salt (sodium), added sugar, and fat. An example eating plan is called the DASH diet. DASH stands for Dietary Approaches to Stop Hypertension. To eat this way: Eat plenty of fresh fruits and vegetables. Try to fill one-half of your plate at each meal with fruits and vegetables. Eat whole grains, such as whole-wheat pasta, brown rice, or whole-grain bread. Fill about one-fourth of your plate with whole grains. Eat low-fat dairy products. Avoid fatty cuts of meat, processed or cured meats, and poultry with skin. Fill about one-fourth of your plate with lean proteins such as fish, chicken without skin, beans, eggs, and tofu. Avoid pre-made and processed foods. These tend to be higher in sodium, added sugar, and fat. Reduce your daily sodium intake. Many people with hypertension should eat less than 1,500 mg of sodium a day. Lifestyle  Work with your health care provider to maintain a healthy body weight or to lose weight. Ask what an ideal weight is for you. Get at least 30 minutes of exercise that causes your heart to beat faster (aerobic exercise) most days of the week. Activities may include walking, swimming, or biking. Include exercise to strengthen your muscles (resistance exercise), such as weight lifting, as part of your weekly exercise routine. Try to do these types of exercises for 30 minutes at least 3 days a week. Do  not use any products that contain nicotine  or tobacco. These products include cigarettes, chewing tobacco, and vaping devices, such as e-cigarettes. If you need help quitting, ask your  health care provider. Control any long-term (chronic) conditions you have, such as high cholesterol or diabetes. Identify your sources of stress and find ways to manage stress. This may include meditation, deep breathing, or making time for fun activities. Alcohol use Do not drink alcohol if: Your health care provider tells you not to drink. You are pregnant, may be pregnant, or are planning to become pregnant. If you drink alcohol: Limit how much you have to: 0-1 drink a day for women. 0-2 drinks a day for men. Know how much alcohol is in your drink. In the [place] one drink equals one 12 oz bottle of beer (355 mL), one 5 oz glass of wine (148 mL), or one 1 oz glass of hard liquor (44 mL). Medicines Your health care provider may prescribe medicine if lifestyle changes are not enough to get your blood pressure under control and if: Your systolic blood pressure is 130 or higher. Your diastolic blood pressure is 80 or higher. Take medicines only as told by your health care provider. Follow the directions carefully. Blood pressure medicines must be taken as told by your health care provider. The medicine does not work as well when you skip doses. Skipping doses also puts you at risk for problems. Monitoring Before you monitor your blood pressure: Do not smoke, drink caffeinated beverages, or exercise within 30 minutes before taking a measurement. Use the bathroom and empty your bladder (urinate). Sit quietly for at least 5 minutes before taking measurements. Monitor your blood pressure at home as told by your health care provider. To do this: Sit with your back straight and supported. Place your feet flat on the floor. Do not cross your legs. Support your arm on a flat surface, such as a table. Make sure your upper arm is at heart level. Each time you measure, take two or three readings one minute apart and record the results. You may also need to have your blood pressure checked regularly by  your health care provider. General information Talk with your health care provider about your diet, exercise habits, and other lifestyle factors that may be contributing to hypertension. Review all the medicines you take with your health care provider because there may be side effects or interactions. Keep all follow-up visits. Your health care provider can help you create and adjust your plan for managing your high blood pressure. Where to find more information National Heart, Lung, and Blood Institute: [employer].is American Heart Association: www.heart.org Contact a health care provider if: You think you are having a reaction to medicines you have taken. You have repeated (recurrent) headaches. You feel dizzy. You have swelling in your ankles. You have trouble with your vision. Get help right away if: You develop a severe headache or confusion. You have unusual weakness or numbness, or you feel faint. You have severe pain in your chest or abdomen. You vomit repeatedly. You have trouble breathing. These symptoms may be an emergency. Get help right away. Call 911. Do not wait to see if the symptoms will go away. Do not drive yourself to the hospital. Summary Hypertension is when the force of blood pumping through your arteries is too strong. If this condition is not controlled, it may put you at risk for serious complications. Your personal target blood pressure may vary depending on your medical conditions,  your age, and other factors. For most people, a normal blood pressure is less than 120/80. Hypertension is managed by lifestyle changes, medicines, or both. Lifestyle changes to help manage hypertension include losing weight, eating a healthy, low-sodium diet, exercising more, stopping smoking, and limiting alcohol. This information is not intended to replace advice given to you by your health care provider. Make sure you discuss any questions you have with your health care  provider. Document Revised: 11/21/2020 Document Reviewed: 11/21/2020 Elsevier Patient Education  2024 [employer].

## 2023-04-28 ENCOUNTER — Other Ambulatory Visit: Payer: Self-pay | Admitting: Family Medicine

## 2023-04-28 ENCOUNTER — Other Ambulatory Visit: Payer: Self-pay

## 2023-04-28 ENCOUNTER — Encounter: Payer: Self-pay | Admitting: Family Medicine

## 2023-04-28 DIAGNOSIS — I4819 Other persistent atrial fibrillation: Secondary | ICD-10-CM

## 2023-04-28 LAB — CMP14+EGFR
ALT: 11 [IU]/L (ref 0–32)
AST: 17 [IU]/L (ref 0–40)
Albumin: 4.6 g/dL (ref 3.8–4.9)
Alkaline Phosphatase: 95 [IU]/L (ref 44–121)
BUN/Creatinine Ratio: 14 (ref 9–23)
BUN: 25 mg/dL — ABNORMAL HIGH (ref 6–24)
Bilirubin Total: 0.5 mg/dL (ref 0.0–1.2)
CO2: 20 mmol/L (ref 20–29)
Calcium: 9.5 mg/dL (ref 8.7–10.2)
Chloride: 104 mmol/L (ref 96–106)
Creatinine, Ser: 1.76 mg/dL — ABNORMAL HIGH (ref 0.57–1.00)
Globulin, Total: 2.9 g/dL (ref 1.5–4.5)
Glucose: 116 mg/dL — ABNORMAL HIGH (ref 70–99)
Potassium: 4.6 mmol/L (ref 3.5–5.2)
Sodium: 139 mmol/L (ref 134–144)
Total Protein: 7.5 g/dL (ref 6.0–8.5)
eGFR: 33 mL/min/{1.73_m2} — ABNORMAL LOW (ref >59)

## 2023-04-28 LAB — LP+NON-HDL CHOLESTEROL
Cholesterol, Total: 178 mg/dL (ref 100–199)
HDL: 57 mg/dL (ref >39)
LDL Chol Calc (NIH): 107 mg/dL — ABNORMAL HIGH (ref 0–99)
Total Non-HDL-Chol (LDL+VLDL): 121 mg/dL (ref 0–129)
Triglycerides: 74 mg/dL (ref 0–149)
VLDL Cholesterol Cal: 14 mg/dL (ref 5–40)

## 2023-04-28 LAB — HEMOGLOBIN A1C
Est. average glucose Bld gHb Est-mCnc: 120 mg/dL
Hgb A1c MFr Bld: 5.8 % — ABNORMAL HIGH (ref 4.8–5.6)

## 2023-04-28 MED ORDER — ATORVASTATIN CALCIUM 80 MG PO TABS
80.0000 mg | ORAL_TABLET | Freq: Every day | ORAL | 1 refills | Status: DC
  Filled 2023-04-28 – 2023-07-13 (×4): qty 90, 90d supply, fill #0
  Filled 2023-10-21: qty 90, 90d supply, fill #1

## 2023-05-04 ENCOUNTER — Ambulatory Visit
Admission: RE | Admit: 2023-05-04 | Discharge: 2023-05-04 | Disposition: A | Discharge status: Home / Self Care | Payer: MEDICAID | Source: Ambulatory Visit | Attending: Family Medicine | Admitting: Family Medicine

## 2023-05-04 DIAGNOSIS — F17210 Nicotine dependence, cigarettes, uncomplicated: Secondary | ICD-10-CM

## 2023-05-05 ENCOUNTER — Telehealth: Payer: Self-pay | Admitting: *Deleted

## 2023-05-05 NOTE — Telephone Encounter (Addendum)
Dr. Baxter Flattery team follow up required: Patient is planning to complete Cologuard by 05/10/23.   She would like to discuss the results of her Cologuard prior to her next office visit on 10/25/23.  Please contact patient with results once Cologuard results completed and updated in [facility]. You may also contact Elmer Picker BSN, RN,CCM, Health Equity Program Manager (HEPM) at [phone] if additional information needed.  Chart reviewed and Administrator, Civil Service (DPR) verification completed.     Chart review highlights and potential barriers to colorectal cancer screening (CRC) screening:  Per patient's health maintenance, patient has never had fecal DNA test. Cologuard order placed on 04/27/23, and no results in [facility]. Patient has been referred to [facility] for colonoscopy, in the past on 05/07/22, 10/30/21 and 08/21/15. All GI referrals were addressed with GI visit on 12/02/22 and 06/22/18.  Patient was scheduled for colonoscopy on 01/05/23 and was a no show for the procedure, and has not called to reschedule to date.  Patient had Fecal occult blood test completed on 05/05/18 with positive results.   Telephone call to patient, verify patient's name, date of birth, and address. Patient in agreement to outreach and brief colon cancer screening (CRC) follow up. Discussed patient's colon cancer screening history per chart review.  Patient states she has received the Cologuard kit but has not completed. Patient planning to complete Cologuard kit and will call [employer] for pick up by 05/10/23. Discussed Cologuard customer services availability, resources for [facility] test completion, patient voices understanding and is in agreement to complete test as soon as possible. Patient states she was a no show for 01/05/23 colonoscopy due to transportation issue and did not have a 24 hour caregiver. Patient is aware that Cologuard results will determine if colonoscopy needs to be rescheduled. States if colonoscopy has to been rescheduled she "will  do what she needs to do to" obtain colonoscopy.  Patient states all of her questions were answered and has no additional questions at this time. Patient states she has no barriers to completing CRC testing.  States she is very Adult nurse of HEPM's call and will reach out to HEPM or Dr. Alvis Lemmings, if she experiences any barriers to test completion. No further HEPM interventions needed at this time.   Jamielyn Petrucci H. Docia Furl, Charity fundraiser, [facility] [phone]

## 2023-05-06 NOTE — Telephone Encounter (Signed)
Noted

## 2023-05-07 ENCOUNTER — Other Ambulatory Visit: Payer: Self-pay

## 2023-05-08 ENCOUNTER — Other Ambulatory Visit (HOSPITAL_COMMUNITY): Payer: Self-pay

## 2023-05-17 ENCOUNTER — Encounter: Payer: Self-pay | Admitting: Family Medicine

## 2023-05-25 ENCOUNTER — Other Ambulatory Visit: Payer: Self-pay | Admitting: Neurosurgery

## 2023-05-25 ENCOUNTER — Ambulatory Visit (HOSPITAL_BASED_OUTPATIENT_CLINIC_OR_DEPARTMENT_OTHER): Payer: MEDICAID | Admitting: Family

## 2023-05-25 ENCOUNTER — Encounter: Payer: Self-pay | Admitting: Neurosurgery

## 2023-05-25 DIAGNOSIS — M4317 Spondylolisthesis, lumbosacral region: Secondary | ICD-10-CM

## 2023-05-25 NOTE — Progress Notes (Deleted)
 Cardiology Office Note:  .   Date:  05/25/2023  ID:  Morgan Bean, DOB Apr 08, 1965, MRN [id number] PCP: Hoy Register, MD  [facility] Providers Cardiologist:  Chilton Si, MD    History of Present Illness: .   Morgan Bean is a 58 y.o. female with a hx of left atrial appendage thrombus, atrial flutter post ablation with recurrent atrial flutter/fib, cocaine use, anxiety, hypertension CKDIIIa, chronic diastolic heart failure, prior CVA, mild pulmonary hypertension, COPD, OSA not on CPAP, depression.   Admitted 02/2018 with acute on chronic diastolic heart failure and atrial fib/flutter with RVR. Echo with LVEF 50-55%, mild AI, moderate to severe MR. Diltiazem and Xarelto started.    ED visit 04/2018 due to atrial flutter. Urine tox positive for amphetamines. She had cardioversion but did not maintain NSR. She was subsequently started on Amiodarone and maintained NSR.    She was admitted 12/2020 with community acquired pneumonia and atrial flutter. Positive for cocaine on admission. TEE showed left atrial appendage thrombus (not on OAC prior to admission) so cardioversion could not be pursued. LVEF 55-60%, RV normal size/function, mild to moderate MR, moderate AI. Dscharged on Xarelto, Amiodarone, Diltiazem with plans to schedule outpatient cardioversion.    She was lost to follow-up until 04/10/2021. Found to be in atrial fibrillation 140 bpm.  Diltiazem, Xarelto 20 were resumed. Admitted 1/22 - 04/17/2021 with A-fib RVR, acute on chronic diastolic heart failure, COVID19.  She had used cocaine 1 day prior to hospitalization.  Echo 04/14/2021 LVEF 60 to 65%, no RWMA, bilateral atria moderately dilated, mild MR. At follow-up 06/26/2021 she was set up for TEE and cardioversion but she canceled procedure.  Seen 09/2021 and preferred to proceed with rate control of atrial flutter - Diltiazem increased to 360 mg daily. She had been clean for 2 months and was living with her mother.  Last seen 02/2022  due to hypotension lisinopril reduced to 5 mg daily.  She was given prescription for nicotine patch.  Seen in clinic 10/13/22.  Lasix increased for 3 days for volume overload.   Noted she had used "a little bit" of cocaine the week provided - cessation encouraged. She was provided clearance for L5-S1 ESI with Dr. Lorrine Kin. At visit 10/2022 shew as no longer using and heart rate controlled.  Presents today for follow up. ***  ROS: Please see the history of present illness.    All other systems reviewed and are negative.   Studies Reviewed: .           Risk Assessment/Calculations:    CHA2DS2-VASc Score = 6   This indicates a 9.7% annual risk of stroke. The patient's score is based upon: CHF History: 1 HTN History: 1 Diabetes History: 0 Stroke History: 2 Vascular Disease History: 1 Age Score: 0 Gender Score: 1   {This patient has a significant risk of stroke if diagnosed with atrial fibrillation.  Please consider VKA or DOAC agent for anticoagulation if the bleeding risk is acceptable.   You can also use the SmartPhrase .[email or web address] for documentation.   :[id number]} No BP recorded.  {Refresh Note OR Click here to enter BP  :1}***       Physical Exam:   VS:  LMP 04/24/2013 Comment: states periods come and go   Wt Readings from Last 3 Encounters:  04/27/23 185 lb 12.8 oz (84.3 kg)  12/02/22 196 lb (88.9 kg)  10/27/22 191 lb (86.6 kg)    GEN: Well nourished, well developed in no acute distress NECK:  No JVD; No carotid bruits CARDIAC: IRIR, tachycardic, no murmurs, rubs, gallops RESPIRATORY:  Clear to auscultation without rales, wheezing or rhonchi  ABDOMEN: Soft, non-tender, non-distended EXTREMITIES:  No edema; No deformity   ASSESSMENT AND PLAN: .     Atrial flutter / Atrial fibrillation / LAA thrombus / Fatigue - ***   HTN - ****   Cocaine use / Tobacco use- ***  Prediabetes - ****   CKDIIIa - Follows with [facility] Dr. Ronalee Belts.    Diastolic heart failure  -***   COPD - ***  Aortic atherosclerosis / Coronary calcification on CT / HLD - 09/10/22 LDL 91. ***.        Dispo: follow up in ***  Signed, Alver Sorrow, NP

## 2023-06-01 LAB — COLOGUARD: COLOGUARD: NEGATIVE

## 2023-06-02 ENCOUNTER — Other Ambulatory Visit: Payer: MEDICAID

## 2023-06-02 ENCOUNTER — Other Ambulatory Visit (HOSPITAL_COMMUNITY): Payer: Self-pay

## 2023-06-02 ENCOUNTER — Other Ambulatory Visit: Payer: Self-pay

## 2023-06-21 ENCOUNTER — Ambulatory Visit: Payer: MEDICAID | Attending: Physical Therapy | Admitting: Physical Therapy

## 2023-06-21 NOTE — Therapy (Deleted)
 OUTPATIENT PHYSICAL THERAPY THORACOLUMBAR EVALUATION   Patient Name: Morgan Bean MRN: [id number] DOB:Jul 17, 1965, 58 y.o., female Today's Date: 06/21/2023  END OF SESSION:   Past Medical History:  Diagnosis Date   Anemia of other chronic disease 11/13/2013   Anginal pain ([name])    none in past year   Anxiety    Asthma    patient denies   Autoimmune disease ([name]) 12/27/2018   Candidiasis 06/23/2013   Chronic diastolic CHF (congestive heart failure) ([name])    Constipation    Coronary artery disease    Lexiscan myoview (10/12) with significant ST depression upon Lexiscan injection but no ischemia or infarction on perfusion images. Left heart cath (10/12): 30% mid LAD, 30% ostial D1, 50% ostial D2.     Cough 11/13/2013   Depression    Headache 02/22/2014   Hx of cardiovascular stress test    Lexiscan Myoview (2/16): Breast attenuation artifact, no ischemia, EF 64%; low risk   Hyperlipemia    Hypertension    Iron deficiency    hx of   Leukocytoclastic vasculitis ([name])    Rash across lower body, occurred in 9/11, diagnosed by skin biopsy. ANA positive. Thought to be secondary to cocaine use.   Leukopenia 03/21/2013   Lymphadenopathy 03/21/2013   Lymphadenopathy 01/30/2014   Mental disorder    Pancytopenia ([name])    Pancytopenia, acquired ([name]) 07/16/2015   Polysubstance abuse ([name])    Prior cocaine   Pulmonary HTN ([name])    Echo (9/11) with EF 65%, mild LVH, mild AI, mild MR, moderate to possibly severe TR with PA systolic pressure 52 mmHg. Echo (10/12): severe LV hypertrophy, EF 60-65%, mild MR, mild AI, moderate to severe tricuspid regurgitation, PA systolic pressure 38 mmHg.  Echo 4/14: moderate LVH, EF 65%, normal wall motion, diastolic dysfunction, mild AI, mild MR   Shortness of breath    a. PFTs 5/14:  FEV1/FVC 99% predicted; FVC 60% predicted; DLCO mildly reduced, mild restriction, air trapping.;  b.  seen by pulmo (Dr. Shelle Iron) 5/14: mainly upper airway symptoms - ACE  d/c'd (sx's better)   Stroke [facility]) 2010ish   Syphilis 04/25/2013   Tobacco abuse    Tricuspid regurgitation    Unspecified deficiency anemia 03/29/2013   Past Surgical History:  Procedure Laterality Date   ANKLE SURGERY     right   BONE MARROW ASPIRATION     CARDIAC CATHETERIZATION     CARDIAC CATHETERIZATION N/A 04/23/2015   Procedure: Right Heart Cath;  Surgeon: Laurey Morale, MD;  Location: [facility] INVASIVE CV LAB;  Service: Cardiovascular;  Laterality: N/A;   CARDIOVERSION N/A 03/11/2018   Procedure: CARDIOVERSION;  Surgeon: Pricilla Riffle, MD;  Location: [facility] ENDOSCOPY;  Service: Cardiovascular;  Laterality: N/A;   I & D EXTREMITY  08/09/2011   Procedure: IRRIGATION AND DEBRIDEMENT EXTREMITY;  Surgeon: Robyne Askew, MD;  Location: [facility] OR;  Service: General;  Laterality: Left;  i & D Left axilla abscess   LYMPH NODE BIOPSY N/A 04/05/2013   Procedure: EXCISIONAL BIOPSY RIGHT SUBMANDIBULAR NODE, NASAL ENDOSCOPIC WITH BIOPSY NASAL PHARYNX;  Surgeon: Flo Shanks, MD;  Location: [facility] OR;  Service: ENT;  Laterality: N/A;   MULTIPLE EXTRACTIONS WITH ALVEOLOPLASTY N/A 03/03/2019   Procedure: MULTIPLE EXTRACTION WITH ALVEOLOPLASTY;  Surgeon: Ocie Doyne, DDS;  Location: [facility] OR;  Service: Oral Surgery;  Laterality: N/A;   TEE WITHOUT CARDIOVERSION N/A 03/11/2018   Procedure: TRANSESOPHAGEAL ECHOCARDIOGRAM (TEE);  Surgeon: Pricilla Riffle, MD;  Location: [facility] ENDOSCOPY;  Service: Cardiovascular;  Laterality: N/A;   TEE WITHOUT CARDIOVERSION N/A 12/27/2020   Procedure: TRANSESOPHAGEAL ECHOCARDIOGRAM (TEE);  Surgeon: Thomasene Ripple, DO;  Location: [facility] ENDOSCOPY;  Service: Cardiovascular;  Laterality: N/A;   Patient Active Problem List   Diagnosis Date Noted   Chronic obstructive pulmonary disease, unspecified COPD type ([name]) 05/07/2022   COVID-19 virus infection 04/14/2021   Hypokalemia 04/14/2021   Chronic kidney disease, stage 3a ([name]) 04/14/2021   Elevated troponin 12/25/2020   Dysphagia 12/25/2020    Acute on chronic congestive heart failure ([name])    Cocaine abuse ([name])    Atrial flutter with rapid ventricular response ([name]) 05/28/2020   Autoimmune disease ([name]) 12/27/2018   Laryngitis, chronic 12/26/2018   Febrile neutropenia ([name]) 12/16/2018   Chronic anticoagulation 10/14/2018   CRI (chronic renal insufficiency), stage 3 (moderate) ([name]) 10/14/2018   Upper airway cough syndrome 06/30/2018   Tobacco dependence in remission 04/11/2018   Substance abuse in remission ([name]) 04/11/2018   Depression, recurrent ([name]) 04/11/2018   PAF (paroxysmal atrial fibrillation) ([name]) 03/10/2018   Persistent atrial fibrillation with RVR ([name])    Nonrheumatic mitral valve regurgitation    Hypertensive urgency 03/08/2018   Unspecified atrial flutter ([name]) 04/25/2016   Tobacco use 11/30/2015   Pancytopenia, acquired ([name]) 07/16/2015   Head and neck lymphadenopathy 07/16/2015   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 05/23/18  05/08/2015   Absolute anemia 02/25/2015   Colon cancer screening 12/27/2014   Primary stabbing headache 12/26/2014   History of stroke 12/26/2014   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   Headache 02/22/2014   Dental caries 01/09/2014   Cough 11/13/2013   Cigarette smoker 11/13/2013   Swelling of gums 11/13/2013   Fatigue 11/10/2013   Intracranial atherosclerosis 11/10/2013   Hyperreflexia 10/04/2013   TIA (transient ischemic attack) 10/04/2013   Fibroid uterus 09/14/2012   Perimenopause 09/14/2012   Chronic cough 08/12/2012   DOE (dyspnea on exertion) 08/12/2012   History of CVA (cerebrovascular accident) 08/09/2012   Pulmonary hypertension ([name]) 04/06/2012   Substance abuse ([name]) 04/06/2012   History of cocaine abuse ([name]) 11/16/2011   Major depressive disorder, recurrent episode with psychotic features. 11/15/2011    Class: Acute   Chronic diastolic heart failure ([name]) 02/24/2011   CAD (coronary artery disease) 02/24/2011   Hyperlipidemia 01/06/2011    Essential hypertension 12/15/2010   Exertional dyspnea 12/15/2010   Leukocytoclastic vasculitis ([name]) 03/24/2007    PCP: Hoy Register, MD  REFERRING PROVIDER: Aliene Beams, MD  REFERRING DIAG: M43.17 (ICD-10-CM) - Spondylolisthesis, lumbosacral region   Rationale for Evaluation and Treatment: Rehabilitation  THERAPY DIAG:  No diagnosis found.  PERTINENT HISTORY: HTN, CHF, CAD, COPD, CKD3, CVA  WEIGHT BEARING RESTRICTIONS: No  FALLS:  Has patient fallen in last 6 months? {fallsyesno:27318}  LIVING ENVIRONMENT: Lives with: {[facility] lives with:25569::"lives with their family"} Lives in: {Lives in:25570} Stairs: {opstairs:27293} Has following equipment at home: {Assistive devices:23999}  OCCUPATION: ***   PRECAUTIONS: {Therapy precautions:24002} ---------------------------------------------------------------------------------------------  SUBJECTIVE:  SUBJECTIVE STATEMENT: Eval statement 06/21/2023: ***  RED FLAGS: {PT Red Flags:29287}    PLOF: {PLOF:24004}  PATIENT GOALS: ***  NEXT MD VISIT: *** ---------------------------------------------------------------------------------------------  OBJECTIVE:  Note: Objective measures were completed at Evaluation unless otherwise noted.  DIAGNOSTIC FINDINGS:  ***  PATIENT SURVEYS:  {rehab surveys:24030}  COGNITION: Overall cognitive status: {cognition:24006}   PALPATION: ***  Lumbar contraction pattern  L Multifidus:  R Multifidus:    SENSATION: {sensation:27233}  MUSCLE LENGTH: Hamstrings: Right ***; deg; Left *** deg  Maisie Fus test: Right ***; Left *** deg  POSTURE: {posture:25561}   LUMBAR ROM:   AROM eval  Flexion   Extension   Right lateral flexion   Left lateral flexion   Right rotation   Left rotation     (Blank rows = not tested)  ! Indicates pain with testing  LOWER EXTREMITY ROM:     {AROM/PROM:27142}  Right eval Left eval  Hip flexion    Hip extension    Hip abduction    Hip adduction    Hip internal rotation    Hip external rotation    Knee flexion    Knee extension    Ankle dorsiflexion    Ankle plantarflexion    Ankle inversion    Ankle eversion     (Blank rows = not tested)  ! Indicates pain with testing  LOWER EXTREMITY MMT:    MMT Right eval Left eval  Hip flexion    Hip extension    Hip abduction    Hip adduction    Hip internal rotation    Hip external rotation    Knee flexion    Knee extension    Ankle dorsiflexion    Ankle plantarflexion    Ankle inversion    Ankle eversion     (Blank rows = not tested)   ! Indicates pain with testing LUMBAR SPECIAL TESTS:  {lumbar special test:25242}  FUNCTIONAL TESTS:  {Functional tests:24029}  GAIT: Distance walked: *** Assistive device utilized: {Assistive devices:23999} Level of assistance: {Levels of assistance:24026} Comments: ***  [facility] Adult PT Treatment:                                                DATE: 06/21/2023  Therapeutic Exercise: *** Manual Therapy: *** Neuromuscular re-ed: *** Therapeutic Activity: *** Modalities: *** Self Care: ***                                                                                                                                PATIENT EDUCATION:  Education details: Pt received education regarding HEP performance, ADL performance, functional activity tolerance, impairment education, appropriate performance of therapeutic activities. *** Person educated: {Person educated:25204} Education method: {Education Method:25205} Education comprehension: {Education Comprehension:25206}  HOME EXERCISE PROGRAM: *** ---------------------------------------------------------------------------------------------  ASSESSMENT:  CLINICAL IMPRESSION: Eval  impression (  06/21/2023): Pt. attended today's physical therapy session for evaluation of ***. Pt has complaints of ***. Pt has notable deficits with ***.  Signs and symptoms are concurrent with ***. Pt would benefit from therapeutic focus on ***.  Treatment performed today focused on *** Pt demonstrated *** understanding of education provided. required *** cues and *** assistance for appropriate performance with today's activities. Pt requires the intervention of skilled outpatient physical therapy to address the aforementioned deficits and progress towards a functional level in line with therapeutic goals.    OBJECTIVE IMPAIRMENTS: {opptimpairments:25111}.   ACTIVITY LIMITATIONS: {activitylimitations:27494}  PARTICIPATION LIMITATIONS: {participationrestrictions:25113}  PERSONAL FACTORS: {Personal factors:25162} are also affecting patient's functional outcome.   REHAB POTENTIAL: {rehabpotential:25112}  CLINICAL DECISION MAKING: {clinical decision making:25114}  EVALUATION COMPLEXITY: {Evaluation complexity:25115}   GOALS: Goals reviewed with patient? YES  SHORT TERM GOALS: Target date: ***  Pt will be independent with administered HEP to demonstrate the competency necessary for long term managemnet of symptoms at home. Baseline: Goal status: INITIAL  2.  *** Baseline:  Goal status: INITIAL  3.  *** Baseline:  Goal status: INITIAL  4.  *** Baseline:  Goal status: INITIAL  5.  *** Baseline:  Goal status: INITIAL  6.  *** Baseline:  Goal status: INITIAL  LONG TERM GOALS: Target date: ***  Pt. Will achieve a MODI score of *** as to demonstrate improvement in self-perceived functional ability with daily activities.  Baseline:  Goal status: INITIAL  2.  Pt will improve Global hip strength to a ***/5 to demonstrate improvement in strength for quality of motion and activity performance.  Baseline:  Goal status: INITIAL  3.  Pt will improve Lumbar AROM to *** of  standardized norms with less than ***/10 pain to demonstrate necessary mobility for high quality and safe ADLs  Baseline:  Goal status: INITIAL  4.  *** Baseline:  Goal status: INITIAL  5.  *** Baseline:  Goal status: INITIAL  6.  *** Baseline:  Goal status: INITIAL ---------------------------------------------------------------------------------------------  PLAN:  PT FREQUENCY: 1-2x/week  PT DURATION: 6 weeks  PLANNED INTERVENTIONS: 97110-Therapeutic exercises, 97530- Therapeutic activity, [id number]- Neuromuscular re-education, 97535- Self Care, 16109- Manual therapy, [id number]- Gait training, [phone]- Aquatic Therapy, Patient/Family education, Balance training, Dry Needling, Joint mobilization, and Spinal mobilization.  PLAN FOR NEXT SESSION: Review HEP, Begin POC as detailed in assessment   Sheliah Plane, PT, DPT 06/21/2023, 7:36 AM

## 2023-07-02 ENCOUNTER — Other Ambulatory Visit: Payer: Self-pay

## 2023-07-13 ENCOUNTER — Other Ambulatory Visit (HOSPITAL_COMMUNITY): Payer: Self-pay

## 2023-07-13 ENCOUNTER — Other Ambulatory Visit: Payer: Self-pay

## 2023-07-26 ENCOUNTER — Other Ambulatory Visit (HOSPITAL_BASED_OUTPATIENT_CLINIC_OR_DEPARTMENT_OTHER): Payer: Self-pay | Admitting: Cardiovascular Disease

## 2023-07-26 ENCOUNTER — Other Ambulatory Visit: Payer: Self-pay

## 2023-07-26 ENCOUNTER — Other Ambulatory Visit (HOSPITAL_BASED_OUTPATIENT_CLINIC_OR_DEPARTMENT_OTHER): Payer: Self-pay | Admitting: Family

## 2023-07-26 ENCOUNTER — Other Ambulatory Visit (HOSPITAL_COMMUNITY): Payer: Self-pay

## 2023-07-26 DIAGNOSIS — E876 Hypokalemia: Secondary | ICD-10-CM

## 2023-07-26 DIAGNOSIS — I4819 Other persistent atrial fibrillation: Secondary | ICD-10-CM

## 2023-07-26 DIAGNOSIS — I5042 Chronic combined systolic (congestive) and diastolic (congestive) heart failure: Secondary | ICD-10-CM

## 2023-07-26 MED ORDER — POTASSIUM CHLORIDE CRYS ER 20 MEQ PO TBCR
20.0000 meq | EXTENDED_RELEASE_TABLET | Freq: Every day | ORAL | 0 refills | Status: DC
  Filled 2023-07-26: qty 90, 90d supply, fill #0

## 2023-07-26 MED ORDER — FUROSEMIDE 40 MG PO TABS
40.0000 mg | ORAL_TABLET | Freq: Every day | ORAL | 0 refills | Status: DC
  Filled 2023-07-26: qty 90, 90d supply, fill #0

## 2023-07-26 MED ORDER — DILTIAZEM HCL ER COATED BEADS 360 MG PO CP24
360.0000 mg | ORAL_CAPSULE | Freq: Every day | ORAL | 0 refills | Status: DC
  Filled 2023-07-26: qty 90, 90d supply, fill #0

## 2023-07-26 MED ORDER — SPIRONOLACTONE 25 MG PO TABS
25.0000 mg | ORAL_TABLET | Freq: Every day | ORAL | 0 refills | Status: DC
  Filled 2023-07-26: qty 90, 90d supply, fill #0

## 2023-07-27 ENCOUNTER — Other Ambulatory Visit: Payer: Self-pay

## 2023-07-27 ENCOUNTER — Other Ambulatory Visit (HOSPITAL_COMMUNITY): Payer: Self-pay

## 2023-08-13 ENCOUNTER — Other Ambulatory Visit (HOSPITAL_COMMUNITY): Payer: Self-pay

## 2023-08-30 ENCOUNTER — Other Ambulatory Visit (HOSPITAL_COMMUNITY): Payer: Self-pay

## 2023-08-30 ENCOUNTER — Other Ambulatory Visit: Payer: Self-pay

## 2023-09-08 ENCOUNTER — Other Ambulatory Visit: Payer: Self-pay

## 2023-10-04 ENCOUNTER — Other Ambulatory Visit (HOSPITAL_COMMUNITY): Payer: Self-pay

## 2023-10-05 ENCOUNTER — Telehealth: Payer: Self-pay | Admitting: Family Medicine

## 2023-10-05 NOTE — Telephone Encounter (Signed)
 Called pt to confirm appt. Pt did not answer and I was able to LVM.

## 2023-10-06 ENCOUNTER — Encounter: Payer: MEDICAID | Admitting: Family Medicine

## 2023-10-21 ENCOUNTER — Other Ambulatory Visit (HOSPITAL_BASED_OUTPATIENT_CLINIC_OR_DEPARTMENT_OTHER): Payer: Self-pay | Admitting: Family

## 2023-10-21 ENCOUNTER — Other Ambulatory Visit (HOSPITAL_BASED_OUTPATIENT_CLINIC_OR_DEPARTMENT_OTHER): Payer: Self-pay | Admitting: Cardiovascular Disease

## 2023-10-21 DIAGNOSIS — E876 Hypokalemia: Secondary | ICD-10-CM

## 2023-10-21 DIAGNOSIS — I4819 Other persistent atrial fibrillation: Secondary | ICD-10-CM

## 2023-10-21 DIAGNOSIS — I110 Hypertensive heart disease with heart failure: Secondary | ICD-10-CM

## 2023-10-22 ENCOUNTER — Telehealth: Payer: Self-pay | Admitting: Family Medicine

## 2023-10-22 ENCOUNTER — Other Ambulatory Visit: Payer: Self-pay

## 2023-10-22 NOTE — Telephone Encounter (Signed)
 Called patient to confirm upcoming appointment 10/25/2023. Patient appointment has been successfully confirmed

## 2023-10-23 ENCOUNTER — Other Ambulatory Visit (HOSPITAL_COMMUNITY): Payer: Self-pay

## 2023-10-25 ENCOUNTER — Other Ambulatory Visit (HOSPITAL_BASED_OUTPATIENT_CLINIC_OR_DEPARTMENT_OTHER): Payer: Self-pay

## 2023-10-25 ENCOUNTER — Other Ambulatory Visit (HOSPITAL_COMMUNITY): Payer: Self-pay

## 2023-10-25 ENCOUNTER — Encounter: Payer: Self-pay | Admitting: Family Medicine

## 2023-10-25 ENCOUNTER — Other Ambulatory Visit: Payer: Self-pay

## 2023-10-25 ENCOUNTER — Ambulatory Visit: Payer: MEDICAID | Attending: Family Medicine | Admitting: Family Medicine

## 2023-10-25 VITALS — BP 171/108 | HR 108 | Ht 66.0 in | Wt 183.8 lb

## 2023-10-25 DIAGNOSIS — I4819 Other persistent atrial fibrillation: Secondary | ICD-10-CM | POA: Diagnosis not present

## 2023-10-25 DIAGNOSIS — F419 Anxiety disorder, unspecified: Secondary | ICD-10-CM

## 2023-10-25 DIAGNOSIS — J4489 Other specified chronic obstructive pulmonary disease: Secondary | ICD-10-CM

## 2023-10-25 DIAGNOSIS — M5416 Radiculopathy, lumbar region: Secondary | ICD-10-CM

## 2023-10-25 DIAGNOSIS — B351 Tinea unguium: Secondary | ICD-10-CM

## 2023-10-25 DIAGNOSIS — L84 Corns and callosities: Secondary | ICD-10-CM | POA: Diagnosis not present

## 2023-10-25 DIAGNOSIS — F32A Depression, unspecified: Secondary | ICD-10-CM

## 2023-10-25 DIAGNOSIS — N183 Hypertensive heart and chronic kidney disease with heart failure and stage 1 through stage 4 chronic kidney disease, or unspecified chronic kidney disease: Secondary | ICD-10-CM

## 2023-10-25 DIAGNOSIS — I130 Hypertensive heart and chronic kidney disease with heart failure and stage 1 through stage 4 chronic kidney disease, or unspecified chronic kidney disease: Secondary | ICD-10-CM

## 2023-10-25 DIAGNOSIS — I129 Hypertensive chronic kidney disease with stage 1 through stage 4 chronic kidney disease, or unspecified chronic kidney disease: Secondary | ICD-10-CM

## 2023-10-25 MED ORDER — FUROSEMIDE 40 MG PO TABS
40.0000 mg | ORAL_TABLET | Freq: Every day | ORAL | 0 refills | Status: DC
  Filled 2023-10-25: qty 30, 30d supply, fill #0

## 2023-10-25 MED ORDER — POTASSIUM CHLORIDE CRYS ER 20 MEQ PO TBCR
20.0000 meq | EXTENDED_RELEASE_TABLET | Freq: Every day | ORAL | 0 refills | Status: DC
  Filled 2023-10-25: qty 90, 90d supply, fill #0

## 2023-10-25 MED ORDER — DILTIAZEM HCL ER COATED BEADS 360 MG PO CP24
360.0000 mg | ORAL_CAPSULE | Freq: Every day | ORAL | 0 refills | Status: DC
  Filled 2023-10-25: qty 90, 90d supply, fill #0

## 2023-10-25 MED ORDER — TRELEGY ELLIPTA 100-62.5-25 MCG/ACT IN AEPB
1.0000 | INHALATION_SPRAY | Freq: Every day | RESPIRATORY_TRACT | 11 refills | Status: AC
  Filled 2023-10-25 – 2024-02-16 (×4): qty 60, 30d supply, fill #0
  Filled 2024-03-22: qty 60, 30d supply, fill #1
  Filled 2024-04-20: qty 60, 30d supply, fill #2

## 2023-10-25 MED ORDER — SPIRONOLACTONE 25 MG PO TABS
25.0000 mg | ORAL_TABLET | Freq: Every day | ORAL | 0 refills | Status: DC
  Filled 2023-10-25: qty 90, 90d supply, fill #0

## 2023-10-25 MED ORDER — RIVAROXABAN 15 MG PO TABS
15.0000 mg | ORAL_TABLET | Freq: Every day | ORAL | 1 refills | Status: DC
  Filled 2023-10-25: qty 90, 90d supply, fill #0

## 2023-10-25 MED ORDER — HYDROXYZINE HCL 50 MG PO TABS
50.0000 mg | ORAL_TABLET | Freq: Three times a day (TID) | ORAL | 0 refills | Status: DC | PRN
Start: 2023-10-25 — End: 2024-01-13
  Filled 2023-10-25: qty 30, 10d supply, fill #0

## 2023-10-25 MED ORDER — DULOXETINE HCL 60 MG PO CPEP
60.0000 mg | ORAL_CAPSULE | Freq: Every day | ORAL | 1 refills | Status: DC
  Filled 2023-10-25: qty 90, 90d supply, fill #0
  Filled 2024-02-16: qty 90, 90d supply, fill #1

## 2023-10-25 MED ORDER — LISINOPRIL 10 MG PO TABS
10.0000 mg | ORAL_TABLET | Freq: Every day | ORAL | 1 refills | Status: DC
  Filled 2023-10-25: qty 90, 90d supply, fill #0
  Filled 2024-01-13: qty 90, 90d supply, fill #1

## 2023-10-25 MED ORDER — ATORVASTATIN CALCIUM 80 MG PO TABS
80.0000 mg | ORAL_TABLET | Freq: Every day | ORAL | 1 refills | Status: AC
  Filled 2023-10-25 – 2024-02-16 (×2): qty 90, 90d supply, fill #0
  Filled 2024-04-20: qty 90, 90d supply, fill #1

## 2023-10-25 NOTE — Telephone Encounter (Signed)
 Prescription refill request for Xarelto  received.  Indication: AF Last office visit: 10/27/22  JAYSON Finder NP Weight: 86.6kg Age: 58 Scr: 1.91 on 09/27/23 [facility] CrCl: 42.82  Based on above findings Xarelto  15mg  daily is the appropriate dose.  Pt is taking Xarelto  20mg  daily.  Pt is past due for MD appt.  Message sent to schedulers.  Message also sent to PharmD Pool to advise on dose reduction.

## 2023-10-25 NOTE — Progress Notes (Signed)
 Subjective:  Patient ID: Morgan Bean, female    DOB: 02/10/1966  Age: 58 y.o. MRN: [id number]  CC: Medical Management of Chronic Issues (Pain in feet(Podiatry referral))     Discussed the use of AI scribe software for clinical note transcription with the patient, who gave verbal consent to proceed.  History of Present Illness Morgan Bean is a 58 year old female with a history of Hypertension, paroxysmal Afib, A-flutter (status post ablation) CAD, previous CVA, moderate to severe MR, HFpEF, anxiety and depression, polysubstance abuse, Nicotine  dependence (2 ppd since the age of 18, 0.5 ppd over the last few years), CKD, emphysema who presents for blood pressure management.  Her blood pressure remains consistently high, and she has not been taking her prescribed antihypertensive medications regularly. Current prescriptions include lisinopril  5 mg, spironolactone  25 mg, furosemide  40 mg, and diltiazem  360 mg. She [facility].  She has atrial fibrillation and is taking Cardizem  and Xarelto .  Has presence of lightheadedness or palpitations. She experiences COPD symptoms, including dyspnea and increased respiratory effort. She smokes nearly a pack a day and uses Spiriva  and albuterol . There has been an increase in her smoking recently.  She experiences anxiety and depression, managed with hydroxyzine . Her anxiety is not well-controlled, and she seeks additional medication.  She experiences ongoing back pain due to lumbar radiculopathy. An MRI was suggested by an orthopedic specialist, but insurance issues prevented it. She has acquired exercise equipment for home use but has not engaged in formal physical therapy.  She experiences foot pain and has onychomycosis affecting both feet, with one foot particularly severe.    Past Medical History:  Diagnosis Date   Anemia of other chronic disease 11/13/2013   Anginal pain ([name])     none in past year   Anxiety    Asthma    patient denies   Autoimmune disease ([name]) 12/27/2018   Candidiasis 06/23/2013   Chronic diastolic CHF (congestive heart failure) ([name])    Constipation    Coronary artery disease    Lexiscan  myoview  (10/12) with significant ST depression upon Lexiscan  injection but no ischemia or infarction on perfusion images. Left heart cath (10/12): 30% mid LAD, 30% ostial D1, 50% ostial D2.     Cough 11/13/2013   Depression    Headache 02/22/2014   Hx of cardiovascular stress test    Lexiscan  Myoview  (2/16): Breast attenuation artifact, no ischemia, EF 64%; low risk   Hyperlipemia    Hypertension    Iron deficiency    hx of   Leukocytoclastic vasculitis ([name])    Rash across lower body, occurred in 9/11, diagnosed by skin biopsy. ANA positive. Thought to be secondary to cocaine use.   Leukopenia 03/21/2013   Lymphadenopathy 03/21/2013   Lymphadenopathy 01/30/2014   Mental disorder    Pancytopenia ([name])    Pancytopenia, acquired ([name]) 07/16/2015   Polysubstance abuse ([name])    Prior cocaine   Pulmonary HTN ([name])    Echo (9/11) with EF 65%, mild LVH, mild AI, mild MR, moderate to possibly severe TR with PA systolic pressure 52 mmHg. Echo (10/12): severe LV hypertrophy, EF 60-65%, mild MR, mild AI, moderate to severe tricuspid regurgitation, PA systolic pressure 38 mmHg.  Echo 4/14: moderate LVH, EF 65%, normal wall motion, diastolic dysfunction, mild AI, mild MR   Shortness of breath    a. PFTs 5/14:  FEV1/FVC 99% predicted; FVC 60% predicted; DLCO mildly reduced, mild restriction, air trapping.;  b.  seen by pulmo (Dr. Corrie) 5/14: mainly upper airway symptoms - ACE d/c'd (sx's better)   Stroke [facility]) 2010ish   Syphilis 04/25/2013   Tobacco abuse    Tricuspid regurgitation    Unspecified deficiency anemia 03/29/2013    Past Surgical History:  Procedure Laterality Date   ANKLE SURGERY     right   BONE MARROW ASPIRATION     CARDIAC CATHETERIZATION      CARDIAC CATHETERIZATION N/A 04/23/2015   Procedure: Right Heart Cath;  Surgeon: Ezra GORMAN Shuck, MD;  Location: [facility] INVASIVE CV LAB;  Service: Cardiovascular;  Laterality: N/A;   CARDIOVERSION N/A 03/11/2018   Procedure: CARDIOVERSION;  Surgeon: Okey Vina GAILS, MD;  Location: [facility] ENDOSCOPY;  Service: Cardiovascular;  Laterality: N/A;   I & D EXTREMITY  08/09/2011   Procedure: IRRIGATION AND DEBRIDEMENT EXTREMITY;  Surgeon: Deward GORMAN Curvin DOUGLAS, MD;  Location: [facility] OR;  Service: General;  Laterality: Left;  i & D Left axilla abscess   LYMPH NODE BIOPSY N/A 04/05/2013   Procedure: EXCISIONAL BIOPSY RIGHT SUBMANDIBULAR NODE, NASAL ENDOSCOPIC WITH BIOPSY NASAL PHARYNX;  Surgeon: Marlyce Finer, MD;  Location: [facility] OR;  Service: ENT;  Laterality: N/A;   MULTIPLE EXTRACTIONS WITH ALVEOLOPLASTY N/A 03/03/2019   Procedure: MULTIPLE EXTRACTION WITH ALVEOLOPLASTY;  Surgeon: Sheryle Hamilton, DDS;  Location: [facility] OR;  Service: Oral Surgery;  Laterality: N/A;   TEE WITHOUT CARDIOVERSION N/A 03/11/2018   Procedure: TRANSESOPHAGEAL ECHOCARDIOGRAM (TEE);  Surgeon: Okey Vina GAILS, MD;  Location: [facility] ENDOSCOPY;  Service: Cardiovascular;  Laterality: N/A;   TEE WITHOUT CARDIOVERSION N/A 12/27/2020   Procedure: TRANSESOPHAGEAL ECHOCARDIOGRAM (TEE);  Surgeon: Sheena Pugh, DO;  Location: [facility] ENDOSCOPY;  Service: Cardiovascular;  Laterality: N/A;    Family History  Problem Relation Age of Onset   Coronary artery disease Mother    Diabetes Mother    Diabetes Father    Hypertension Father    Coronary artery disease Father    Heart attack Father    Breast cancer Maternal Grandmother    Diabetes Maternal Grandmother    Heart attack Maternal Grandmother    Stroke Maternal Grandmother    Asthma Grandson     Social History   Socioeconomic History   Marital status: Single    Spouse name: Not on file   Number of children: 2   Years of education: Not on file   Highest education level: GED or equivalent  Occupational History    Occupation: unemployed   Occupation: disabled  Tobacco Use   Smoking status: Former    Current packs/day: 0.50    Average packs/day: 0.5 packs/day for 39.0 years (19.5 ttl pk-yrs)    Types: Cigarettes   Smokeless tobacco: Never  Vaping Use   Vaping status: Never Used  Substance and Sexual Activity   Alcohol use: Not Currently    Comment: used in the past   Drug use: Yes    Frequency: 7.0 times per week    Types: Crack cocaine   Sexual activity: Not Currently  Other Topics Concern   Not on file  Social History Narrative   Lives with mom, sisters and brothers.   Social Drivers of Health   Financial Resource Strain: Medium Risk (10/21/2023)   Overall Financial Resource Strain (CARDIA)    Difficulty of Paying Living Expenses: Somewhat hard  Food Insecurity: Food Insecurity Present (10/21/2023)   Hunger Vital Sign    Worried About Running Out of Food in the Last Year: Sometimes true    Ran Out  of Food in the Last Year: Sometimes true  Transportation Needs: Unmet Transportation Needs (10/21/2023)   PRAPARE - Transportation    Lack of Transportation (Medical): Yes    Lack of Transportation (Non-Medical): Yes  Physical Activity: Inactive (10/21/2023)   Exercise Vital Sign    Days of Exercise per Week: 0 days    Minutes of Exercise per Session: Not on file  Stress: Stress Concern Present (10/21/2023)   [employer] of Occupational Health - Occupational Stress Questionnaire    Feeling of Stress: Rather much  Social Connections: Socially Isolated (10/21/2023)   Social Connection and Isolation Panel    Frequency of Communication with Friends and Family: Once a week    Frequency of Social Gatherings with Friends and Family: Once a week    Attends Religious Services: 1 to 4 times per year    Active Member of [employer] or Organizations: No    Attends Engineer, structural: Not on file    Marital Status: Never married    Allergies  Allergen Reactions   Ciprofloxacin Itching     Outpatient Medications Prior to Visit  Medication Sig Dispense Refill   albuterol  (VENTOLIN  HFA) 108 (90 Base) MCG/ACT inhaler Inhale 2 puffs into the lungs every 6 (six) hours as needed for wheezing or shortness of breath. 18 g 2   Cholecalciferol  (VITAMIN D ) 50 MCG (2000 UT) CAPS Take 1 capsule (2,000 Units total) by mouth daily. 30 capsule 6   diltiazem  (CARDIZEM  CD) 360 MG 24 hr capsule Take 1 capsule (360 mg total) by mouth daily. 90 capsule 0   ferrous sulfate  325 (65 FE) MG tablet Take 325 mg by mouth daily with breakfast.     fluticasone  (FLONASE ) 50 MCG/ACT nasal spray Place 2 sprays into both nostrils daily. 16 g 11   furosemide  (LASIX ) 40 MG tablet Take 1 tablet (40 mg total) by mouth daily. 30 tablet 0   omeprazole  (PRILOSEC) 40 MG capsule Take 1 capsule (40 mg total) by mouth daily. Take with water, wait 30-60 minutes after taking before eating or drinking anything but water 90 capsule 3   potassium chloride  SA (KLOR-CON  M) 20 MEQ tablet Take 1 tablet (20 mEq total) by mouth daily. 90 tablet 0   rivaroxaban  (XARELTO ) 20 MG TABS tablet Take 1 tablet (20 mg total) by mouth daily with supper. 90 tablet 3   rivaroxaban  (XARELTO ) 20 MG TABS tablet Take 1 tablet (20 mg total) by mouth daily with supper.     sodium bicarbonate  650 MG tablet Take 1 tablet (650 mg total) by mouth 2 (two) times daily. 60 tablet 6   spironolactone  (ALDACTONE ) 25 MG tablet Take 1 tablet (25 mg total) by mouth once daily. 90 tablet 0   atorvastatin  (LIPITOR ) 80 MG tablet Take 1 tablet (80 mg total) by mouth at bedtime. 90 tablet 1   hydrOXYzine  (ATARAX ) 50 MG tablet Take 1 tablet (50 mg total) by mouth every 8 (eight) hours as needed. 30 tablet 0   lisinopril  (ZESTRIL ) 5 MG tablet Take 1 tablet (5 mg total) by mouth daily. 30 tablet 5   tiotropium (SPIRIVA  HANDIHALER) 18 MCG inhalation capsule Place 1 capsule (18 mcg total) into inhaler and inhale daily. 30 capsule 6   nicotine  (NICODERM CQ ) 21 mg/24hr patch  Place 1 patch (21 mg total) onto the skin daily. (Patient not taking: Reported on 10/25/2023) 28 patch 0   furosemide  (LASIX ) 40 MG tablet Take 1 tablet (40 mg total) by mouth daily. 90  tablet 0   No facility-administered medications prior to visit.     ROS Review of Systems  Constitutional:  Negative for activity change and appetite change.  HENT:  Negative for sinus pressure and sore throat.   Respiratory:  Negative for chest tightness, shortness of breath and wheezing.   Cardiovascular:  Negative for chest pain and palpitations.  Gastrointestinal:  Negative for abdominal distention, abdominal pain and constipation.  Genitourinary: Negative.   Musculoskeletal:        See HPI  Psychiatric/Behavioral:  Negative for behavioral problems and dysphoric mood.     Objective:  BP (!) 171/108   Pulse (!) 108   Ht 5' 6 (1.676 m)   Wt 183 lb 12.8 oz (83.4 kg)   LMP 04/24/2013 Comment: states periods come and go  SpO2 98%   BMI 29.67 kg/m      10/25/2023    9:58 AM 10/25/2023    9:27 AM 04/27/2023   10:57 AM  BP/Weight  Systolic BP 171 183 154  Diastolic BP 108 122 104  Wt. (Lbs)  183.8   BMI  29.67 kg/m2       Physical Exam Constitutional:      Appearance: She is well-developed.  Cardiovascular:     Rate and Rhythm: Tachycardia present.     Heart sounds: Normal heart sounds. No murmur heard. Pulmonary:     Effort: Pulmonary effort is normal.     Breath sounds: Normal breath sounds. No wheezing or rales.  Chest:     Chest wall: No tenderness.  Abdominal:     General: Bowel sounds are normal. There is no distension.     Palpations: Abdomen is soft. There is no mass.     Tenderness: There is no abdominal tenderness.  Musculoskeletal:        General: Normal range of motion.     Right lower leg: No edema.     Left lower leg: No edema.     Comments: Anterior sole of right foot with tender callus.  Onychomycosis on great toes bilaterally  Neurological:     Mental Status: She  is alert and oriented to person, place, and time.  Psychiatric:        Mood and Affect: Mood normal.        Latest Ref Rng & Units 04/27/2023   11:02 AM 10/13/2022    9:24 AM 09/10/2022    9:26 AM  CMP  Glucose 70 - 99 mg/dL 883  99  91   BUN 6 - 24 mg/dL 25  24  17    Creatinine 0.57 - 1.00 mg/dL 8.23  8.40  8.60   Sodium 134 - 144 mmol/L 139  144  141   Potassium 3.5 - 5.2 mmol/L 4.6  5.4  4.4   Chloride 96 - 106 mmol/L 104  107  107   CO2 20 - 29 mmol/L 20  21  18    Calcium  8.7 - 10.2 mg/dL 9.5  9.9  9.5   Total Protein 6.0 - 8.5 g/dL 7.5   7.6   Total Bilirubin 0.0 - 1.2 mg/dL 0.5   0.4   Alkaline Phos 44 - 121 IU/L 95   93   AST 0 - 40 IU/L 17   17   ALT 0 - 32 IU/L 11   14     Lipid Panel     Component Value Date/Time   CHOL 178 04/27/2023 1102   TRIG 74 04/27/2023 1102   HDL 57  04/27/2023 1102   CHOLHDL 3.2 12/26/2020 0232   VLDL 10 12/26/2020 0232   LDLCALC 107 (H) 04/27/2023 1102   LDLDIRECT 142.1 12/25/2010 0846    CBC    Component Value Date/Time   WBC 5.4 09/10/2022 0926   WBC 5.9 03/16/2022 1054   RBC 5.10 09/10/2022 0926   RBC 4.50 03/16/2022 1054   HGB 13.6 09/10/2022 0926   HGB 10.0 (L) 07/16/2015 1407   HCT 42.3 09/10/2022 0926   HCT 31.8 (L) 07/16/2015 1407   PLT 212 09/10/2022 0926   MCV 83 09/10/2022 0926   MCV 78.2 (L) 07/16/2015 1407   MCH 26.7 09/10/2022 0926   MCH 26.7 03/16/2022 1054   MCHC 32.2 09/10/2022 0926   MCHC 31.6 03/16/2022 1054   RDW 13.4 09/10/2022 0926   RDW 14.9 (H) 07/16/2015 1407   LYMPHSABS 2.0 09/10/2022 0926   LYMPHSABS 1.8 07/16/2015 1407   MONOABS 0.4 04/17/2021 0428   MONOABS 0.8 07/16/2015 1407   EOSABS 0.2 09/10/2022 0926   BASOSABS 0.0 09/10/2022 0926   BASOSABS 0.0 07/16/2015 1407    Lab Results  Component Value Date   HGBA1C 5.8 (H) 04/27/2023       Assessment & Plan Hypertension with congestive heart failure and stage 3 chronic kidney disease Hypertension uncontrolled, risking heart failure  and kidney disease exacerbation. Ejection fraction 60-65%. Potassium levels normal in February 2025, elevated in July 2024. Non-compliance with lisinopril  noted. - Increase lisinopril  dose from 5 mg to 10 mg daily. - Instruct to take blood pressure medication in the morning. - He is to schedule appointment with cardiologist, Dr. Annabella Scarce, for hypertension management and follow-up echocardiogram. - Schedule blood tests in two weeks to monitor potassium levels. - Advise to avoid NSAIDs like Aleve and ibuprofen  to prevent worsening kidney function.  Atrial fibrillation Atrial fibrillation managed with diltiazem  and rivaroxaban . 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 Spiriva  and albuterol  used, but smoking exacerbates symptoms. - Discontinue Spiriva  and start Trelegy to manage COPD symptoms. - Strongly advise smoking cessation to improve COPD outcomes.  Depression and anxiety Symptoms persist despite hydroxyzine . Cymbalta  may aid anxiety, depression, and chronic pain. - Prescribe Cymbalta  for management of anxiety, depression, and chronic pain.  Lumbar radiculopathy with chronic back pain Chronic back pain persists. Referred to orthopedics, MRI delayed due to insurance. Acquired exercise equipment but not engaged in formal physical therapy. - Recommend formal physical therapy for back pain management.  Onychomycosis of toenails, bilateral, with callus of right foot Bilateral onychomycosis with foot pain. History of calluses and fungal infection in toenails. - Refer to podiatrist for management of onychomycosis and foot pain.   Meds ordered this encounter  Medications   atorvastatin  (LIPITOR ) 80 MG tablet    Sig: Take 1 tablet (80 mg total) by mouth at bedtime.    Dispense:  90 tablet    Refill:  1    Dose increase   hydrOXYzine  (ATARAX ) 50 MG tablet    Sig: Take 1 tablet (50 mg  total) by mouth every 8 (eight) hours as needed.    Dispense:  30 tablet    Refill:  0   lisinopril  (ZESTRIL ) 10 MG tablet    Sig: Take 1 tablet (10 mg total) by mouth daily.    Dispense:  90 tablet    Refill:  1    Dose incease   Fluticasone -Umeclidin-Vilant (TRELEGY ELLIPTA ) 100-62.5-25 MCG/ACT AEPB  Sig: Inhale 1 puff into the lungs daily.    Dispense:  60 each    Refill:  11    Discontinue Spirva   DULoxetine  (CYMBALTA ) 60 MG capsule    Sig: Take 1 capsule (60 mg total) by mouth daily.    Dispense:  90 capsule    Refill:  1    Follow-up: Return in about 1 month (around 11/25/2023) for Blood Pressure follow-up with PCP.     Corrina Sabin, MD, FAAFP. [facility] [place], [place] [phone]   10/25/2023, 12:46 PM

## 2023-10-25 NOTE — Patient Instructions (Signed)
 VISIT SUMMARY:  Today, we discussed your ongoing health issues, including high blood pressure, atrial fibrillation, COPD, anxiety, depression, back pain, and foot pain. We reviewed your current medications and made some adjustments to better manage your conditions.  YOUR PLAN:  -HYPERTENSION WITH CONGESTIVE HEART FAILURE AND STAGE 3 CHRONIC KIDNEY DISEASE: Your blood pressure is still high, which can worsen your heart and kidney conditions. We are increasing your lisinopril  dose to 10 mg daily and you should take it in the morning. You will also need to see Dr. Annabella Scarce, a cardiologist, for further management and a follow-up echocardiogram. Blood tests will be scheduled in two weeks to check your potassium levels. Avoid NSAIDs like Aleve and ibuprofen  as they can harm your kidneys.  -ATRIAL FIBRILLATION: Your irregular heartbeat is being managed with diltiazem  and rivaroxaban . Continue taking these medications as prescribed.  -CHRONIC OBSTRUCTIVE PULMONARY DISEASE (COPD) WITH TOBACCO USE DISORDER: Your COPD symptoms are worsening, partly due to increased smoking. We are switching your medication from Spiriva  to Trelegy. It's very important that you stop smoking to help improve your symptoms.  -DEPRESSION AND ANXIETY: Your anxiety and depression are not well-controlled with hydroxyzine . We are prescribing Cymbalta , which can help with both your mood and chronic pain.  -LUMBAR RADICULOPATHY WITH CHRONIC BACK PAIN: Your chronic back pain continues to be an issue. We recommend starting formal physical therapy to help manage the pain.  -ONYCHOMYCOSIS OF TOENAILS, BILATERAL, WITH FOOT PAIN: You have a fungal infection in your toenails causing foot pain. We are referring you to a podiatrist for specialized care.  INSTRUCTIONS:  Please follow up with Dr. Annabella Scarce for hypertension management and a follow-up echocardiogram. Schedule blood tests in two weeks to monitor your potassium levels.  Begin taking Cymbalta  as prescribed and start formal physical therapy for your back pain. Make an appointment with a podiatrist for your foot pain and toenail infection. Avoid NSAIDs like Aleve and ibuprofen .

## 2023-10-26 ENCOUNTER — Other Ambulatory Visit: Payer: Self-pay

## 2023-10-27 ENCOUNTER — Other Ambulatory Visit (HOSPITAL_COMMUNITY): Payer: Self-pay

## 2023-10-28 ENCOUNTER — Other Ambulatory Visit (HOSPITAL_COMMUNITY): Payer: Self-pay

## 2023-10-29 ENCOUNTER — Other Ambulatory Visit (HOSPITAL_COMMUNITY): Payer: Self-pay

## 2023-11-04 ENCOUNTER — Other Ambulatory Visit (HOSPITAL_COMMUNITY): Payer: Self-pay

## 2023-11-11 ENCOUNTER — Ambulatory Visit: Payer: MEDICAID | Admitting: Family Medicine

## 2023-11-29 ENCOUNTER — Ambulatory Visit: Payer: MEDICAID | Admitting: Family Medicine

## 2023-11-30 ENCOUNTER — Ambulatory Visit: Payer: MEDICAID | Admitting: Podiatry

## 2023-12-03 ENCOUNTER — Ambulatory Visit: Payer: MEDICAID | Admitting: Podiatry

## 2024-01-11 ENCOUNTER — Other Ambulatory Visit (HOSPITAL_BASED_OUTPATIENT_CLINIC_OR_DEPARTMENT_OTHER): Payer: Self-pay

## 2024-01-11 ENCOUNTER — Encounter (HOSPITAL_BASED_OUTPATIENT_CLINIC_OR_DEPARTMENT_OTHER): Payer: Self-pay

## 2024-01-11 DIAGNOSIS — I4819 Other persistent atrial fibrillation: Secondary | ICD-10-CM

## 2024-01-11 DIAGNOSIS — E782 Mixed hyperlipidemia: Secondary | ICD-10-CM

## 2024-01-11 DIAGNOSIS — I10 Essential (primary) hypertension: Secondary | ICD-10-CM

## 2024-01-11 DIAGNOSIS — I110 Hypertensive heart disease with heart failure: Secondary | ICD-10-CM

## 2024-01-11 DIAGNOSIS — E876 Hypokalemia: Secondary | ICD-10-CM

## 2024-01-12 ENCOUNTER — Encounter (HOSPITAL_BASED_OUTPATIENT_CLINIC_OR_DEPARTMENT_OTHER): Payer: Self-pay

## 2024-01-13 ENCOUNTER — Ambulatory Visit (HOSPITAL_BASED_OUTPATIENT_CLINIC_OR_DEPARTMENT_OTHER): Payer: Self-pay | Admitting: Family

## 2024-01-13 ENCOUNTER — Other Ambulatory Visit: Payer: Self-pay

## 2024-01-13 ENCOUNTER — Other Ambulatory Visit: Payer: Self-pay | Admitting: Family Medicine

## 2024-01-13 ENCOUNTER — Other Ambulatory Visit (HOSPITAL_BASED_OUTPATIENT_CLINIC_OR_DEPARTMENT_OTHER): Payer: Self-pay | Admitting: Family

## 2024-01-13 DIAGNOSIS — F419 Anxiety disorder, unspecified: Secondary | ICD-10-CM

## 2024-01-13 LAB — LIPID PANEL
Chol/HDL Ratio: 3.8 ratio (ref 0.0–4.4)
Cholesterol, Total: 177 mg/dL (ref 100–199)
HDL: 47 mg/dL (ref >39)
LDL Chol Calc (NIH): 114 mg/dL — ABNORMAL HIGH (ref 0–99)
Triglycerides: 88 mg/dL (ref 0–149)
VLDL Cholesterol Cal: 16 mg/dL (ref 5–40)

## 2024-01-13 LAB — COMPREHENSIVE METABOLIC PANEL WITH GFR
ALT: 18 IU/L (ref 0–32)
AST: 18 IU/L (ref 0–40)
Albumin: 4.1 g/dL (ref 3.8–4.9)
Alkaline Phosphatase: 94 IU/L (ref 49–135)
BUN/Creatinine Ratio: 13 (ref 12–28)
BUN: 20 mg/dL (ref 8–27)
Bilirubin Total: 1 mg/dL (ref 0.0–1.2)
CO2: 17 mmol/L — ABNORMAL LOW (ref 20–29)
Calcium: 9.6 mg/dL (ref 8.7–10.3)
Chloride: 106 mmol/L (ref 96–106)
Creatinine, Ser: 1.57 mg/dL — ABNORMAL HIGH (ref 0.57–1.00)
Globulin, Total: 3.4 g/dL (ref 1.5–4.5)
Glucose: 95 mg/dL (ref 70–99)
Potassium: 3.9 mmol/L (ref 3.5–5.2)
Sodium: 140 mmol/L (ref 134–144)
Total Protein: 7.5 g/dL (ref 6.0–8.5)
eGFR: 38 mL/min/1.73 — ABNORMAL LOW (ref >59)

## 2024-01-14 ENCOUNTER — Other Ambulatory Visit (HOSPITAL_COMMUNITY): Payer: Self-pay

## 2024-01-14 ENCOUNTER — Other Ambulatory Visit: Payer: Self-pay

## 2024-01-14 ENCOUNTER — Encounter (HOSPITAL_BASED_OUTPATIENT_CLINIC_OR_DEPARTMENT_OTHER): Payer: Self-pay | Admitting: Family

## 2024-01-14 ENCOUNTER — Encounter (HOSPITAL_BASED_OUTPATIENT_CLINIC_OR_DEPARTMENT_OTHER): Payer: Self-pay

## 2024-01-14 ENCOUNTER — Telehealth: Payer: Self-pay | Admitting: Pharmacist

## 2024-01-14 ENCOUNTER — Ambulatory Visit: Payer: Self-pay | Admitting: Family Medicine

## 2024-01-14 ENCOUNTER — Other Ambulatory Visit (HOSPITAL_BASED_OUTPATIENT_CLINIC_OR_DEPARTMENT_OTHER): Payer: Self-pay

## 2024-01-14 ENCOUNTER — Ambulatory Visit (HOSPITAL_BASED_OUTPATIENT_CLINIC_OR_DEPARTMENT_OTHER): Payer: MEDICAID | Admitting: Family

## 2024-01-14 VITALS — BP 168/104 | HR 104 | Ht 65.5 in | Wt 177.9 lb

## 2024-01-14 DIAGNOSIS — I10 Essential (primary) hypertension: Secondary | ICD-10-CM

## 2024-01-14 DIAGNOSIS — I4819 Other persistent atrial fibrillation: Secondary | ICD-10-CM | POA: Diagnosis not present

## 2024-01-14 DIAGNOSIS — E782 Mixed hyperlipidemia: Secondary | ICD-10-CM

## 2024-01-14 MED ORDER — HYDROXYZINE HCL 50 MG PO TABS
50.0000 mg | ORAL_TABLET | Freq: Three times a day (TID) | ORAL | 0 refills | Status: DC | PRN
  Filled 2024-01-14 – 2024-01-20 (×2): qty 30, 10d supply, fill #0

## 2024-01-14 NOTE — Telephone Encounter (Signed)
 Patient is requesting PA approval for Trelegy.

## 2024-01-14 NOTE — Telephone Encounter (Signed)
 FYI Only or Action Required?: Action required by provider: medication refill request.  Patient was last seen in primary care on 10/25/2023 by Delbert Clam, MD.  Called Nurse Triage reporting Advice Only.  Symptoms began several months ago.  Interventions attempted: Nothing.  Symptoms are: gradually worsening.  Triage Disposition: Call PCP Now  Patient/caregiver understands and will follow disposition?: Yes     Copied from CRM #[id number]. Topic: Clinical - Red Word Triage >> Jan 14, 2024 11:42 AM Myrick T wrote: Red Word that prompted transfer to Nurse Triage: Nanetta called from [facility] where patient is there but short of breath and need her medication Fluticasone -Umeclidin-Vilant (TRELEGY ELLIPTA ) 100-62.5-25 MCG/ACT AEPB. Nanetta says [facility] is requesting a PA for the medication. Reason for Disposition  [1] Prescription refill request for ESSENTIAL medicine (i.e., likelihood of harm to patient if not taken) AND [2] triager unable to refill per department policy  Answer Assessment - Initial Assessment Questions 1. REASON FOR CALL or QUESTION: What is your reason for calling today? or How can I best     Pt was switched from spiriva  to trelogy in August. Pt told heart care staff she hasn't gotten it yet. Nanetta (nurse at heart care) did some digging and it hasn't been filled because it needs a prior authorization that pharmacy states hasn't been completed. Please assist. Pt would like medication sent to pharmacy at [facility].  2. CALLER: Document the source of call. (e.g., laboratory staff, caregiver or patient). Nanetta from [facility]  Answer Assessment - Initial Assessment Questions Pt was switched from spiriva  to trelogy in August. Pt told heart care staff she hasn't gotten it yet. Nanetta (nurse at heart care) did some digging and it hasn't been filled because it needs a prior authorization that pharmacy states hasn't been completed. Please  assist. Pt would like medication sent to pharmacy at [facility].  Protocols used: PCP Call - No Triage-A-AH, Medication Refill and Renewal Call-A-AH

## 2024-01-14 NOTE — Telephone Encounter (Signed)
 PA needed for inhaler

## 2024-01-14 NOTE — Telephone Encounter (Signed)
 Requested medication (s) are due for refill today: Yes  Requested medication (s) are on the active medication list: Yes  Last refill:  08/21/22  Future visit scheduled: Yes  Notes to clinic:  Unable to refill per protocol due to failed labs, no updated results.      Requested Prescriptions  Pending Prescriptions Disp Refills   Cholecalciferol  (VITAMIN D ) 50 MCG (2000 UT) CAPS 30 capsule 6    Sig: Take 1 capsule (2,000 Units total) by mouth daily.     Endocrinology:  Vitamins - Vitamin D  Supplementation 2 Failed - 01/14/2024  5:39 PM      Failed - Manual Review: Route requests for 50,000 IU strength to the provider      Failed - Vitamin D  in normal range and within 360 days    Vit D, 25-Hydroxy  Date Value Ref Range Status  10/18/2009 11 (L) 30 - 89 ng/mL Final    Comment:    See lab report for associated comment(s)         Passed - Ca in normal range and within 360 days    Calcium   Date Value Ref Range Status  01/12/2024 9.6 8.7 - 10.3 mg/dL Final  87/84/7984 8.6 8.4 - 10.4 mg/dL Final   Calcium , Ion  Date Value Ref Range Status  10/18/2007 0.95 (L)  Final         Passed - Valid encounter within last 12 months    Recent Outpatient Visits           2 months ago Callus of foot   [place] [facility] [place]. [facility] Delbert Clam, MD   8 months ago Lumbar radiculopathy   [place] [facility] [place] [facility] [place]. [facility] Delbert Clam, MD   1 year ago 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 05/23/18    [place] [facility] [place] [facility] [place]. [facility] Delbert Clam, MD   1 year ago Chronic obstructive pulmonary disease, unspecified COPD type [facility])   [place] [facility] . [facility] Delbert Clam, MD   2 years ago Annual physical exam   [place] [facility] [place] [facility] Of . [facility]  Delbert Clam, MD       Future Appointments             In 2 months Vannie Reche RAMAN, NP [facility], [place] [facility]            Signed Prescriptions Disp Refills   hydrOXYzine  (ATARAX ) 50 MG tablet 30 tablet 0    Sig: Take 1 tablet (50 mg total) by mouth every 8 (eight) hours as needed.     Ear, Nose, and Throat:  Antihistamines 2 Failed - 01/14/2024  5:39 PM      Failed - Cr in normal range and within 360 days    Creatinine  Date Value Ref Range Status  03/06/2014 1.1 0.6 - 1.1 mg/dL Final   Creat  Date Value Ref Range Status  04/23/2014 1.01 0.50 - 1.10 mg/dL Final   Creatinine, Ser  Date Value Ref Range Status  01/12/2024 1.57 (H) 0.57 - 1.00 mg/dL Final         Passed - Valid encounter within last 12 months    Recent Outpatient Visits  2 months ago Callus of foot    [facility] [place] [facility] [place]. [facility] Delbert Clam, MD   8 months ago Lumbar radiculopathy   [place] [facility] [place] [facility] [place]. [facility] Delbert Clam, MD   1 year ago 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 05/23/18    [place] [facility] [place] [facility] [place]. [facility] Delbert Clam, MD   1 year ago Chronic obstructive pulmonary disease, unspecified COPD type [facility])   [place] [facility] [place]. [facility] Delbert Clam, MD   2 years ago Annual physical exam   [place] [facility] [place] [facility] [place]. [facility] Delbert Clam, MD       Future Appointments             In 2 months Vannie, Reche RAMAN, NP [facility] at [facility], [place] [facility]

## 2024-01-14 NOTE — Progress Notes (Signed)
 Patient left without being seen. Did not make clinical team nor front desk aware. Front desk aware to reschedule appointment. Elevated BP/HR in setting of not taking her medications prior to clinic visit. No charge encounter.   Alexio Sroka S Naven Giambalvo, NP

## 2024-01-14 NOTE — Progress Notes (Deleted)
 PA request routed to our PA team.

## 2024-01-16 ENCOUNTER — Emergency Department (HOSPITAL_COMMUNITY): Payer: MEDICAID

## 2024-01-16 ENCOUNTER — Inpatient Hospital Stay (HOSPITAL_COMMUNITY)
Admission: EM | Admit: 2024-01-16 | Discharge: 2024-01-20 | DRG: 291 | Disposition: A | Discharge status: Home / Self Care | Payer: MEDICAID | Attending: Internal Medicine | Admitting: Internal Medicine

## 2024-01-16 ENCOUNTER — Other Ambulatory Visit: Payer: Self-pay

## 2024-01-16 ENCOUNTER — Observation Stay (HOSPITAL_COMMUNITY): Payer: MEDICAID

## 2024-01-16 DIAGNOSIS — I5033 Acute on chronic diastolic (congestive) heart failure: Secondary | ICD-10-CM | POA: Diagnosis present

## 2024-01-16 DIAGNOSIS — J4489 Other specified chronic obstructive pulmonary disease: Secondary | ICD-10-CM | POA: Diagnosis present

## 2024-01-16 DIAGNOSIS — D631 Anemia in chronic kidney disease: Secondary | ICD-10-CM | POA: Diagnosis present

## 2024-01-16 DIAGNOSIS — I77819 Aortic ectasia, unspecified site: Secondary | ICD-10-CM | POA: Diagnosis present

## 2024-01-16 DIAGNOSIS — I480 Paroxysmal atrial fibrillation: Secondary | ICD-10-CM | POA: Diagnosis present

## 2024-01-16 DIAGNOSIS — N1831 Chronic kidney disease, stage 3a: Secondary | ICD-10-CM | POA: Diagnosis not present

## 2024-01-16 DIAGNOSIS — Z5948 Other specified lack of adequate food: Secondary | ICD-10-CM

## 2024-01-16 DIAGNOSIS — Z79899 Other long term (current) drug therapy: Secondary | ICD-10-CM

## 2024-01-16 DIAGNOSIS — Z825 Family history of asthma and other chronic lower respiratory diseases: Secondary | ICD-10-CM

## 2024-01-16 DIAGNOSIS — G4733 Obstructive sleep apnea (adult) (pediatric): Secondary | ICD-10-CM | POA: Diagnosis present

## 2024-01-16 DIAGNOSIS — I700 Atherosclerosis of aorta: Secondary | ICD-10-CM | POA: Diagnosis present

## 2024-01-16 DIAGNOSIS — N2889 Other specified disorders of kidney and ureter: Secondary | ICD-10-CM | POA: Diagnosis present

## 2024-01-16 DIAGNOSIS — Z7901 Long term (current) use of anticoagulants: Secondary | ICD-10-CM

## 2024-01-16 DIAGNOSIS — E785 Hyperlipidemia, unspecified: Secondary | ICD-10-CM | POA: Diagnosis present

## 2024-01-16 DIAGNOSIS — I272 Pulmonary hypertension, unspecified: Secondary | ICD-10-CM | POA: Diagnosis present

## 2024-01-16 DIAGNOSIS — Z833 Family history of diabetes mellitus: Secondary | ICD-10-CM

## 2024-01-16 DIAGNOSIS — Z91199 Patient's noncompliance with other medical treatment and regimen due to unspecified reason: Secondary | ICD-10-CM

## 2024-01-16 DIAGNOSIS — N289 Disorder of kidney and ureter, unspecified: Secondary | ICD-10-CM

## 2024-01-16 DIAGNOSIS — F339 Major depressive disorder, recurrent, unspecified: Secondary | ICD-10-CM | POA: Diagnosis present

## 2024-01-16 DIAGNOSIS — Z881 Allergy status to other antibiotic agents status: Secondary | ICD-10-CM

## 2024-01-16 DIAGNOSIS — R739 Hyperglycemia, unspecified: Secondary | ICD-10-CM

## 2024-01-16 DIAGNOSIS — I472 Ventricular tachycardia, unspecified: Secondary | ICD-10-CM | POA: Diagnosis present

## 2024-01-16 DIAGNOSIS — Z560 Unemployment, unspecified: Secondary | ICD-10-CM

## 2024-01-16 DIAGNOSIS — I10 Essential (primary) hypertension: Secondary | ICD-10-CM | POA: Diagnosis not present

## 2024-01-16 DIAGNOSIS — Z803 Family history of malignant neoplasm of breast: Secondary | ICD-10-CM

## 2024-01-16 DIAGNOSIS — Z8249 Family history of ischemic heart disease and other diseases of the circulatory system: Secondary | ICD-10-CM

## 2024-01-16 DIAGNOSIS — Z5982 Transportation insecurity: Secondary | ICD-10-CM

## 2024-01-16 DIAGNOSIS — Z5941 Food insecurity: Secondary | ICD-10-CM

## 2024-01-16 DIAGNOSIS — F1410 Cocaine abuse, uncomplicated: Secondary | ICD-10-CM | POA: Diagnosis present

## 2024-01-16 DIAGNOSIS — I4891 Unspecified atrial fibrillation: Secondary | ICD-10-CM | POA: Diagnosis not present

## 2024-01-16 DIAGNOSIS — F17210 Nicotine dependence, cigarettes, uncomplicated: Secondary | ICD-10-CM | POA: Diagnosis present

## 2024-01-16 DIAGNOSIS — J9601 Acute respiratory failure with hypoxia: Principal | ICD-10-CM | POA: Diagnosis present

## 2024-01-16 DIAGNOSIS — Z7151 Drug abuse counseling and surveillance of drug abuser: Secondary | ICD-10-CM

## 2024-01-16 DIAGNOSIS — I493 Ventricular premature depolarization: Secondary | ICD-10-CM | POA: Diagnosis present

## 2024-01-16 DIAGNOSIS — T50996A Underdosing of other drugs, medicaments and biological substances, initial encounter: Secondary | ICD-10-CM | POA: Diagnosis present

## 2024-01-16 DIAGNOSIS — I4892 Unspecified atrial flutter: Secondary | ICD-10-CM | POA: Diagnosis present

## 2024-01-16 DIAGNOSIS — Z59868 Other specified financial insecurity: Secondary | ICD-10-CM

## 2024-01-16 DIAGNOSIS — Z823 Family history of stroke: Secondary | ICD-10-CM

## 2024-01-16 DIAGNOSIS — Z23 Encounter for immunization: Secondary | ICD-10-CM

## 2024-01-16 DIAGNOSIS — I130 Hypertensive heart and chronic kidney disease with heart failure and stage 1 through stage 4 chronic kidney disease, or unspecified chronic kidney disease: Principal | ICD-10-CM | POA: Diagnosis present

## 2024-01-16 DIAGNOSIS — I2510 Atherosclerotic heart disease of native coronary artery without angina pectoris: Secondary | ICD-10-CM | POA: Diagnosis present

## 2024-01-16 DIAGNOSIS — Z91128 Patient's intentional underdosing of medication regimen for other reason: Secondary | ICD-10-CM

## 2024-01-16 DIAGNOSIS — Z8673 Personal history of transient ischemic attack (TIA), and cerebral infarction without residual deficits: Secondary | ICD-10-CM

## 2024-01-16 DIAGNOSIS — Z888 Allergy status to other drugs, medicaments and biological substances status: Secondary | ICD-10-CM

## 2024-01-16 DIAGNOSIS — Z604 Social exclusion and rejection: Secondary | ICD-10-CM | POA: Diagnosis present

## 2024-01-16 DIAGNOSIS — I4819 Other persistent atrial fibrillation: Secondary | ICD-10-CM | POA: Diagnosis present

## 2024-01-16 LAB — BASIC METABOLIC PANEL WITH GFR
Anion gap: 13 (ref 5–15)
BUN: 31 mg/dL — ABNORMAL HIGH (ref 6–20)
CO2: 18 mmol/L — ABNORMAL LOW (ref 22–32)
Calcium: 9.6 mg/dL (ref 8.9–10.3)
Chloride: 109 mmol/L (ref 98–111)
Creatinine, Ser: 1.38 mg/dL — ABNORMAL HIGH (ref 0.44–1.00)
GFR, Estimated: 44 mL/min — ABNORMAL LOW (ref >60)
Glucose, Bld: 107 mg/dL — ABNORMAL HIGH (ref 70–99)
Potassium: 3.7 mmol/L (ref 3.5–5.1)
Sodium: 141 mmol/L (ref 135–145)

## 2024-01-16 LAB — PROCALCITONIN: Procalcitonin: <0.1 ng/mL

## 2024-01-16 LAB — ECHOCARDIOGRAM COMPLETE
AR max vel: 2.25 cm2
AV Area VTI: 1.96 cm2
AV Area mean vel: 2.02 cm2
AV Mean grad: 4 mmHg
AV Peak grad: 8.3 mmHg
Ao pk vel: 1.44 m/s
Area-P 1/2: 4.08 cm2
Calc EF: 59.1 %
MV M vel: 6.05 m/s
MV Peak grad: 146.2 mmHg
MV VTI: 1.38 cm2
P 1/2 time: 422 ms
Radius: 0.9 cm
S' Lateral: 3.4 cm
Single Plane A2C EF: 54.6 %
Single Plane A4C EF: 60.5 %

## 2024-01-16 LAB — TSH: TSH: 1.56 u[IU]/mL (ref 0.350–4.500)

## 2024-01-16 LAB — MRSA NEXT GEN BY PCR, NASAL: MRSA by PCR Next Gen: NOT DETECTED

## 2024-01-16 LAB — URINE DRUG SCREEN
Amphetamines: NEGATIVE
Barbiturates: NEGATIVE
Benzodiazepines: NEGATIVE
Cocaine: POSITIVE — AB
Fentanyl: NEGATIVE
Methadone Scn, Ur: NEGATIVE
Opiates: NEGATIVE
Tetrahydrocannabinol: NEGATIVE

## 2024-01-16 LAB — CBC
HCT: 40.5 % (ref 36.0–46.0)
Hemoglobin: 13 g/dL (ref 12.0–15.0)
MCH: 27.3 pg (ref 26.0–34.0)
MCHC: 32.1 g/dL (ref 30.0–36.0)
MCV: 84.9 fL (ref 80.0–100.0)
Platelets: 187 K/uL (ref 150–400)
RBC: 4.77 MIL/uL (ref 3.87–5.11)
RDW: 15.5 % (ref 11.5–15.5)
WBC: 7.5 K/uL (ref 4.0–10.5)
nRBC: 0 % (ref 0.0–0.2)

## 2024-01-16 LAB — HIV ANTIBODY (ROUTINE TESTING W REFLEX): HIV Screen 4th Generation wRfx: NONREACTIVE

## 2024-01-16 LAB — TROPONIN T, HIGH SENSITIVITY
Troponin T High Sensitivity: 34 ng/L — ABNORMAL HIGH (ref 0–19)
Troponin T High Sensitivity: 36 ng/L — ABNORMAL HIGH (ref 0–19)

## 2024-01-16 LAB — PRO BRAIN NATRIURETIC PEPTIDE: Pro Brain Natriuretic Peptide: 5200 pg/mL — ABNORMAL HIGH (ref <300.0)

## 2024-01-16 MED ORDER — ACETAMINOPHEN 650 MG RE SUPP: 650.0000 mg | Freq: Four times a day (QID) | RECTAL | Status: DC | PRN

## 2024-01-16 MED ORDER — ACETAMINOPHEN 325 MG PO TABS: 650.0000 mg | ORAL_TABLET | Freq: Four times a day (QID) | ORAL | Status: DC | PRN

## 2024-01-16 MED ORDER — IOHEXOL 350 MG/ML SOLN
75.0000 mL | Freq: Once | INTRAVENOUS | Status: AC | PRN
  Administered 2024-01-16: 75 mL via INTRAVENOUS

## 2024-01-16 MED ORDER — RIVAROXABAN 15 MG PO TABS
15.0000 mg | ORAL_TABLET | Freq: Every day | ORAL | Status: DC
  Administered 2024-01-16 – 2024-01-19 (×4): 15 mg via ORAL
  Filled 2024-01-16 (×4): qty 1

## 2024-01-16 MED ORDER — DILTIAZEM LOAD VIA INFUSION
10.0000 mg | Freq: Once | INTRAVENOUS | Status: AC
  Administered 2024-01-16: 10 mg via INTRAVENOUS
  Filled 2024-01-16: qty 10

## 2024-01-16 MED ORDER — SPIRONOLACTONE 25 MG PO TABS
25.0000 mg | ORAL_TABLET | Freq: Every day | ORAL | Status: DC
  Administered 2024-01-16 – 2024-01-20 (×5): 25 mg via ORAL
  Filled 2024-01-16 (×5): qty 1

## 2024-01-16 MED ORDER — FUROSEMIDE 10 MG/ML IJ SOLN: 60.0000 mg | Freq: Two times a day (BID) | INTRAMUSCULAR | Status: DC

## 2024-01-16 MED ORDER — ASPIRIN 81 MG PO CHEW
324.0000 mg | CHEWABLE_TABLET | Freq: Once | ORAL | Status: AC
  Administered 2024-01-16: 324 mg via ORAL
  Filled 2024-01-16: qty 4

## 2024-01-16 MED ORDER — DILTIAZEM HCL-DEXTROSE 125-5 MG/125ML-% IV SOLN (PREMIX)
5.0000 mg/h | INTRAVENOUS | Status: DC
  Administered 2024-01-16: 15 mg/h via INTRAVENOUS
  Administered 2024-01-16: 5 mg/h via INTRAVENOUS
  Administered 2024-01-16 – 2024-01-17 (×2): 15 mg/h via INTRAVENOUS
  Filled 2024-01-16 (×4): qty 125

## 2024-01-16 MED ORDER — HYDROXYZINE HCL 25 MG PO TABS: 25.0000 mg | ORAL_TABLET | Freq: Three times a day (TID) | ORAL | Status: DC | PRN

## 2024-01-16 MED ORDER — DULOXETINE HCL 60 MG PO CPEP
60.0000 mg | ORAL_CAPSULE | Freq: Every day | ORAL | Status: DC
  Administered 2024-01-16 – 2024-01-20 (×5): 60 mg via ORAL
  Filled 2024-01-16 (×2): qty 1
  Filled 2024-01-16 (×2): qty 2
  Filled 2024-01-16: qty 1

## 2024-01-16 MED ORDER — ORAL CARE MOUTH RINSE: 15.0000 mL | OROMUCOSAL | Status: DC | PRN

## 2024-01-16 MED ORDER — CHLORHEXIDINE GLUCONATE CLOTH 2 % EX PADS
6.0000 | MEDICATED_PAD | Freq: Every day | CUTANEOUS | Status: DC
  Administered 2024-01-16: 6 via TOPICAL

## 2024-01-16 MED ORDER — SODIUM CHLORIDE 0.9 % IV SOLN
2.0000 g | Freq: Once | INTRAVENOUS | Status: AC
  Administered 2024-01-16: 2 g via INTRAVENOUS
  Filled 2024-01-16: qty 20

## 2024-01-16 MED ORDER — FUROSEMIDE 10 MG/ML IJ SOLN
40.0000 mg | Freq: Once | INTRAMUSCULAR | Status: AC
  Administered 2024-01-16: 40 mg via INTRAVENOUS
  Filled 2024-01-16: qty 4

## 2024-01-16 MED ORDER — SODIUM CHLORIDE 0.9 % IV SOLN
500.0000 mg | Freq: Once | INTRAVENOUS | Status: AC
  Administered 2024-01-16: 500 mg via INTRAVENOUS
  Filled 2024-01-16: qty 5

## 2024-01-16 MED ORDER — FUROSEMIDE 10 MG/ML IJ SOLN
60.0000 mg | Freq: Two times a day (BID) | INTRAMUSCULAR | Status: DC
Start: 2024-01-16 — End: 2024-01-19
  Administered 2024-01-16 – 2024-01-19 (×6): 60 mg via INTRAVENOUS
  Filled 2024-01-16 (×6): qty 6

## 2024-01-16 MED ORDER — PANTOPRAZOLE SODIUM 40 MG PO TBEC
40.0000 mg | DELAYED_RELEASE_TABLET | Freq: Every day | ORAL | Status: DC
  Administered 2024-01-16 – 2024-01-20 (×5): 40 mg via ORAL
  Filled 2024-01-16 (×5): qty 1

## 2024-01-16 MED ORDER — ATORVASTATIN CALCIUM 40 MG PO TABS
80.0000 mg | ORAL_TABLET | Freq: Every day | ORAL | Status: DC
  Administered 2024-01-16 – 2024-01-20 (×5): 80 mg via ORAL
  Filled 2024-01-16 (×5): qty 2

## 2024-01-16 MED ORDER — BUDESON-GLYCOPYRROL-FORMOTEROL 160-9-4.8 MCG/ACT IN AERO
2.0000 | INHALATION_SPRAY | Freq: Two times a day (BID) | RESPIRATORY_TRACT | Status: DC
Start: 2024-01-16 — End: 2024-01-20
  Administered 2024-01-16 – 2024-01-20 (×9): 2 via RESPIRATORY_TRACT
  Filled 2024-01-16: qty 5.9

## 2024-01-16 NOTE — ED Triage Notes (Signed)
 PT States that she has been short of breath and has a history of heart issues and COPD. PT states she has been out of medications since August.

## 2024-01-16 NOTE — Consult Note (Signed)
 Cardiology Consultation   Patient ID: Morgan Bean MRN: [id number]; DOB: 1965/05/30  Admit date: 01/16/2024 Date of Consult: 01/16/2024  PCP:  Delbert Clam, MD   [facility] Providers Cardiologist:  Annabella Scarce, MD        Patient Profile: Morgan Bean is a 58 y.o. female with a hx of atrial flutter/atrial fibrillation with prior ablation and subsequent recurrence, COPD, OSA not on CPAP, CKD stage IIIa, CVA, hypertension, and history of polysubstance abuse including cocaine and tobacco who is being seen 01/16/2024 for the evaluation of atrial fibrillation and decompensated heart failure at the request of Dr. Kathrin.  History of Present Illness: Morgan Bean notes approximately 3 weeks of worsening shortness of breath, preceded by an episode of pneumonia per her report.  Denies lower extremity edema.  Does endorse ongoing cocaine use, last 4 days ago.  UDS not performed in hospital given patient's admission.  Inconsistent medication use.  Missed her last appointment with cardiology due to leaving before being seen.  Hypertensive in the ED with atrial fibrillation with rapid ventricular response she has been given IV Cardizem  and Lasix  as well as antibiotics.  Cardiology consulted due to elevated JVP.  Echocardiogram demonstrates EF 50 to 55% and mild RV dysfunction, severe biatrial dilation, mild to moderate MR, TR, and AI.  Moderately elevated RVSP 54 mmHg.  Patient is mildly tachypneic on exam but is warm and nontoxic.  She is in good spirits.  She has a nonproductive cough.  PRIOR HISTORY: Admitted 02/2018 with acute on chronic diastolic heart failure and atrial fib/flutter with RVR. Echo with LVEF 50-55%, mild AI, moderate to severe MR. Diltiazem  and Xarelto  started.    ED visit 04/2018 due to atrial flutter. Urine tox positive for amphetamines. She had cardioversion but did not maintain NSR. She was subsequently started on Amiodarone  and maintained NSR.    She was  admitted 12/2020 with community acquired pneumonia and atrial flutter. Positive for cocaine on admission. TEE showed left atrial appendage thrombus (not on OAC prior to admission) so cardioversion could not be pursued. LVEF 55-60%, RV normal size/function, mild to moderate MR, moderate AI. Dscharged on Xarelto , Amiodarone , Diltiazem  with plans to schedule outpatient cardioversion.    She was lost to follow-up until 04/10/2021. Found to be in atrial fibrillation 140 bpm.  Diltiazem , Xarelto  20 were resumed. Admitted 1/22 - 04/17/2021 with A-fib RVR, acute on chronic diastolic heart failure, COVID19.  She had used cocaine 1 day prior to hospitalization.  Echo 04/14/2021 LVEF 60 to 65%, no RWMA, bilateral atria moderately dilated, mild MR. At follow-up 06/26/2021 she was set up for TEE and cardioversion but she canceled procedure.  Seen 09/2021 and preferred to proceed with rate control of atrial flutter - Diltiazem  increased to 360 mg daily. She had been clean for 2 months and was living with her mother.  Last seen 02/2022 due to hypotension lisinopril  reduced to 5 mg daily.  She was given prescription for nicotine  patch.   She saw PCP 09/10/2022 with dyspnea, volume overload with ProBNP 1005 and was recommended for ED evaluation but it does not appear she went to the ED. Seen in clinic 10/13/22.  Lasix  increased for 3 days for volume overload.   Noted she had used a little bit of cocaine the week provided - cessation encouraged.     Past Medical History:  Diagnosis Date   Anemia of other chronic disease 11/13/2013   Anginal pain    none in past year   Anxiety  Asthma    patient denies   Autoimmune disease 12/27/2018   Candidiasis 06/23/2013   Chronic diastolic CHF (congestive heart failure) ([name])    Constipation    Coronary artery disease    Lexiscan  myoview  (10/12) with significant ST depression upon Lexiscan  injection but no ischemia or infarction on perfusion images. Left heart cath (10/12): 30%  mid LAD, 30% ostial D1, 50% ostial D2.     Cough 11/13/2013   Depression    Headache 02/22/2014   Hx of cardiovascular stress test    Lexiscan  Myoview  (2/16): Breast attenuation artifact, no ischemia, EF 64%; low risk   Hyperlipemia    Hypertension    Iron deficiency    hx of   Leukocytoclastic vasculitis ([name])    Rash across lower body, occurred in 9/11, diagnosed by skin biopsy. ANA positive. Thought to be secondary to cocaine use.   Leukopenia 03/21/2013   Lymphadenopathy 03/21/2013   Lymphadenopathy 01/30/2014   Mental disorder    Pancytopenia ([name])    Pancytopenia, acquired ([name]) 07/16/2015   Polysubstance abuse ([name])    Prior cocaine   Pulmonary HTN ([name])    Echo (9/11) with EF 65%, mild LVH, mild AI, mild MR, moderate to possibly severe TR with PA systolic pressure 52 mmHg. Echo (10/12): severe LV hypertrophy, EF 60-65%, mild MR, mild AI, moderate to severe tricuspid regurgitation, PA systolic pressure 38 mmHg.  Echo 4/14: moderate LVH, EF 65%, normal wall motion, diastolic dysfunction, mild AI, mild MR   Shortness of breath    a. PFTs 5/14:  FEV1/FVC 99% predicted; FVC 60% predicted; DLCO mildly reduced, mild restriction, air trapping.;  b.  seen by pulmo (Dr. Corrie) 5/14: mainly upper airway symptoms - ACE d/c'd (sx's better)   Stroke [facility]) 2010ish   Syphilis 04/25/2013   Tobacco abuse    Tricuspid regurgitation    Unspecified deficiency anemia 03/29/2013    Past Surgical History:  Procedure Laterality Date   ANKLE SURGERY     right   BONE MARROW ASPIRATION     CARDIAC CATHETERIZATION     CARDIAC CATHETERIZATION N/A 04/23/2015   Procedure: Right Heart Cath;  Surgeon: Ezra GORMAN Shuck, MD;  Location: [facility] INVASIVE CV LAB;  Service: Cardiovascular;  Laterality: N/A;   CARDIOVERSION N/A 03/11/2018   Procedure: CARDIOVERSION;  Surgeon: Okey Vina GAILS, MD;  Location: [facility] ENDOSCOPY;  Service: Cardiovascular;  Laterality: N/A;   I & D EXTREMITY  08/09/2011   Procedure:  IRRIGATION AND DEBRIDEMENT EXTREMITY;  Surgeon: Deward GORMAN Curvin DOUGLAS, MD;  Location: [facility] OR;  Service: General;  Laterality: Left;  i & D Left axilla abscess   LYMPH NODE BIOPSY N/A 04/05/2013   Procedure: EXCISIONAL BIOPSY RIGHT SUBMANDIBULAR NODE, NASAL ENDOSCOPIC WITH BIOPSY NASAL PHARYNX;  Surgeon: Marlyce Finer, MD;  Location: [facility] OR;  Service: ENT;  Laterality: N/A;   MULTIPLE EXTRACTIONS WITH ALVEOLOPLASTY N/A 03/03/2019   Procedure: MULTIPLE EXTRACTION WITH ALVEOLOPLASTY;  Surgeon: Sheryle Hamilton, DDS;  Location: [facility] OR;  Service: Oral Surgery;  Laterality: N/A;   TEE WITHOUT CARDIOVERSION N/A 03/11/2018   Procedure: TRANSESOPHAGEAL ECHOCARDIOGRAM (TEE);  Surgeon: Okey Vina GAILS, MD;  Location: [facility] ENDOSCOPY;  Service: Cardiovascular;  Laterality: N/A;   TEE WITHOUT CARDIOVERSION N/A 12/27/2020   Procedure: TRANSESOPHAGEAL ECHOCARDIOGRAM (TEE);  Surgeon: Tobb, Kardie, DO;  Location: [facility] ENDOSCOPY;  Service: Cardiovascular;  Laterality: N/A;     Home Medications:  Prior to Admission medications   Medication Sig Start Date End Date Taking? Authorizing Provider  acetaminophen  (TYLENOL ) 500  MG tablet Take 1,000 mg by mouth 2 (two) times daily as needed for headache or fever (pain).   Yes [provider]  albuterol  (VENTOLIN  HFA) 108 (90 Base) MCG/ACT inhaler Inhale 2 puffs into the lungs every 6 (six) hours as needed for wheezing or shortness of breath. 04/27/23  Yes Newlin, Enobong, MD  atorvastatin  (LIPITOR ) 80 MG tablet Take 1 tablet (80 mg total) by mouth at bedtime. Patient taking differently: Take 80 mg by mouth daily. 10/25/23  Yes Newlin, Enobong, MD  Cholecalciferol  (VITAMIN D -3 PO) Take 2 each by mouth daily. OTC vitamin D3 gummy   Yes [provider]  diltiazem  (CARDIZEM  CD) 360 MG 24 hr capsule Take 1 capsule (360 mg total) by mouth daily. Please call office to schedule appt with cardiologist for continued refills. [id number] 10/25/23  Yes Raford Riggs, MD  DULoxetine   (CYMBALTA ) 60 MG capsule Take 1 capsule (60 mg total) by mouth daily. 10/25/23  Yes Newlin, Enobong, MD  fluticasone  (FLONASE ) 50 MCG/ACT nasal spray Place 2 sprays into both nostrils daily. Patient taking differently: Place 1-2 sprays into both nostrils daily as needed for allergies. 02/17/23  Yes   furosemide  (LASIX ) 40 MG tablet Take 1 tablet (40 mg total) by mouth daily. Please call office to schedule appt with cardiologist for continued refills. [id number] 10/25/23  Yes Vannie Reche RAMAN, NP  hydrOXYzine  (ATARAX ) 50 MG tablet Take 1 tablet (50 mg total) by mouth every 8 (eight) hours as needed. 01/14/24  Yes Newlin, Enobong, MD  ibuprofen  (ADVIL ) 200 MG tablet Take 400 mg by mouth 2 (two) times daily as needed for headache or fever (pain).   Yes [provider]  lisinopril  (ZESTRIL ) 10 MG tablet Take 1 tablet (10 mg total) by mouth daily. Patient taking differently: Take 20 mg by mouth daily. 10/25/23  Yes Newlin, Enobong, MD  omeprazole  (PRILOSEC) 40 MG capsule Take 1 capsule (40 mg total) by mouth daily. Take with water, wait 30-60 minutes after taking before eating or drinking anything but water Patient taking differently: Take 40 mg by mouth daily as needed (heartburn). 11/12/22  Yes   potassium chloride  SA (KLOR-CON  M) 20 MEQ tablet Take 1 tablet (20 mEq total) by mouth daily. Please call office to schedule appt with cardiologist for continued refills. [id number] 10/25/23  Yes Raford Riggs, MD  Rivaroxaban  (XARELTO ) 15 MG TABS tablet Take 1 tablet (15 mg total) by mouth daily with supper. Patient taking differently: Take 15 mg by mouth every evening. 10/25/23  Yes Raford Riggs, MD  sodium bicarbonate  650 MG tablet Take 1 tablet (650 mg total) by mouth 2 (two) times daily. 08/31/22  Yes Dolan Mateo Larger, MD  spironolactone  (ALDACTONE ) 25 MG tablet Take 1 tablet (25 mg total) by mouth once daily. Please call office to schedule appt with cardiologist for continued refills. [phone]  10/25/23  Yes Raford Riggs, MD  Tiotropium Bromide  (SPIRIVA  HANDIHALER) 18 MCG CAPS Place 18 mg into inhaler and inhale daily as needed (wheezing, shortness of breath).   Yes [provider]  Fluticasone -Umeclidin-Vilant (TRELEGY ELLIPTA ) 100-62.5-25 MCG/ACT AEPB Inhale 1 puff into the lungs daily. Patient not taking: Reported on 01/16/2024 10/25/23   Newlin, Enobong, MD    Scheduled Meds:  budesonide -glycopyrrolate-formoterol   2 puff Inhalation BID   Chlorhexidine  Gluconate Cloth  6 each Topical Daily   furosemide   60 mg Intravenous Q12H   Rivaroxaban   15 mg Oral Q supper   Continuous Infusions:  diltiazem  (CARDIZEM ) infusion 15 mg/hr (01/16/24 0900)  PRN Meds: acetaminophen  **OR** acetaminophen , mouth rinse  Allergies:    Allergies  Allergen Reactions   Cipro [Ciprofloxacin Hcl] Itching   Cyprodenate Other (See Comments)    Unknown reaction    Social History:   Social History   Socioeconomic History   Marital status: Single    Spouse name: Not on file   Number of children: 2   Years of education: Not on file   Highest education level: GED or equivalent  Occupational History   Occupation: unemployed   Occupation: disabled  Tobacco Use   Smoking status: Former    Current packs/day: 0.50    Average packs/day: 0.5 packs/day for 39.0 years (19.5 ttl pk-yrs)    Types: Cigarettes    Passive exposure: Current   Smokeless tobacco: Never  Vaping Use   Vaping status: Never Used  Substance and Sexual Activity   Alcohol use: Not Currently    Comment: used in the past   Drug use: Yes    Frequency: 7.0 times per week    Types: Crack cocaine   Sexual activity: Not Currently  Other Topics Concern   Not on file  Social History Narrative   Lives with mom, sisters and brothers.   Social Drivers of Health   Financial Resource Strain: Medium Risk (10/21/2023)   Overall Financial Resource Strain (CARDIA)    Difficulty of Paying Living Expenses: Somewhat hard   Food Insecurity: No Food Insecurity (01/16/2024)   Hunger Vital Sign    Worried About Running Out of Food in the Last Year: Never true    Ran Out of Food in the Last Year: Never true  Recent Concern: Food Insecurity - Food Insecurity Present (10/21/2023)   Hunger Vital Sign    Worried About Running Out of Food in the Last Year: Sometimes true    Ran Out of Food in the Last Year: Sometimes true  Transportation Needs: Unmet Transportation Needs (10/21/2023)   PRAPARE - Administrator, Civil Service (Medical): Yes    Lack of Transportation (Non-Medical): Yes  Physical Activity: Inactive (10/21/2023)   Exercise Vital Sign    Days of Exercise per Week: 0 days    Minutes of Exercise per Session: Not on file  Stress: Stress Concern Present (10/21/2023)   [employer] of Occupational Health - Occupational Stress Questionnaire    Feeling of Stress: Rather much  Social Connections: Socially Isolated (10/21/2023)   Social Connection and Isolation Panel    Frequency of Communication with Friends and Family: Once a week    Frequency of Social Gatherings with Friends and Family: Once a week    Attends Religious Services: 1 to 4 times per year    Active Member of [employer] or Organizations: No    Attends Engineer, Structural: Not on file    Marital Status: Never married  Catering Manager Violence: Not on file    Family History:    Family History  Problem Relation Age of Onset   Coronary artery disease Mother    Diabetes Mother    Diabetes Father    Hypertension Father    Coronary artery disease Father    Heart attack Father    Breast cancer Maternal Grandmother    Diabetes Maternal Grandmother    Heart attack Maternal Grandmother    Stroke Maternal Grandmother    Asthma Grandson      ROS:  Please see the history of present illness.   All other ROS reviewed and negative.  Physical Exam/Data: Vitals:   01/16/24 1100 01/16/24 1102 01/16/24 1106 01/16/24 1107   BP: (!) 155/127 (!) 155/127 (!) 150/110   Pulse: 93 84 89 95  Resp: 18 14 18  (!) 32  Temp:  98 F (36.7 C)    TempSrc:  Oral    SpO2: 96% 99% 93% 99%    Intake/Output Summary (Last 24 hours) at 01/16/2024 1109 Last data filed at 01/16/2024 1045 Gross per 24 hour  Intake 1037.82 ml  Output --  Net 1037.82 ml      01/14/2024   11:21 AM 10/25/2023    9:27 AM 04/27/2023   10:16 AM  Last 3 Weights  Weight (lbs) 177 lb 14.4 oz 183 lb 12.8 oz 185 lb 12.8 oz  Weight (kg) 80.695 kg 83.371 kg 84.278 kg     There is no height or weight on file to calculate BMI.  General:  Well nourished, well developed, in no acute distress, mildly tachypneic HEENT: normal Neck: JVD to mid 1/3 of neck sitting upright. Vascular: No carotid bruits; Distal pulses 2+ bilaterally Cardiac:  normal S1, S2; RRR; no murmur  Lungs:  clear to auscultation bilaterally, no wheezing, rhonchi or rales  Abd: soft, nontender, no hepatomegaly  Ext: no edema Musculoskeletal:  No deformities, BUE and BLE strength normal and equal Skin: warm and dry  Neuro:  CNs 2-12 intact, no focal abnormalities noted Psych:  Normal affect   EKG:  The EKG was personally reviewed and demonstrates: Atrial fibrillation rate 144, aberrant beats or PVCs, LVH, borderline T wave abnormality laterally Telemetry:  Telemetry was personally reviewed and demonstrates: A-fib rates 80s to 90s  Relevant CV Studies: Echo as above  Laboratory Data: High Sensitivity Troponin:  No results for input(s): TROPONINIHS in the last 720 hours.   Chemistry Recent Labs  Lab 01/12/24 1430 01/16/24 0340  NA 140 141  K 3.9 3.7  CL 106 109  CO2 17* 18*  GLUCOSE 95 107*  BUN 20 31*  CREATININE 1.57* 1.38*  CALCIUM  9.6 9.6  GFRNONAA  --  44*  ANIONGAP  --  13    Recent Labs  Lab 01/12/24 1430  PROT 7.5  ALBUMIN 4.1  AST 18  ALT 18  ALKPHOS 94  BILITOT 1.0   Lipids  Recent Labs  Lab 01/12/24 1433  CHOL 177  TRIG 88  HDL 47  LABVLDL 16   LDLCALC 114*  CHOLHDL 3.8    Hematology Recent Labs  Lab 01/16/24 0340  WBC 7.5  RBC 4.77  HGB 13.0  HCT 40.5  MCV 84.9  MCH 27.3  MCHC 32.1  RDW 15.5  PLT 187   Thyroid   Recent Labs  Lab 01/16/24 0903  TSH 1.560    BNP Recent Labs  Lab 01/16/24 0340  PROBNP 5,200.0*    DDimer No results for input(s): DDIMER in the last 168 hours.  Radiology/Studies:  CT Angio Chest PE W and/or Wo Contrast Result Date: 01/16/2024 EXAM: CTA of the Chest without and with contrast for PE 01/16/2024 05:32:01 AM TECHNIQUE: CTA of the chest was performed without and with the administration of intravenous contrast (75 mL iohexol  (OMNIPAQUE ) 350 MG/ML injection). Multiplanar reformatted images are provided for review. MIP images are provided for review. Automated exposure control, iterative reconstruction, and/or weight based adjustment of the mA/kV was utilized to reduce the radiation dose to as low as reasonably achievable. COMPARISON: CTA chest 12/25/2020. Chest radiographs earlier today. Non-contrast chest CT 05/04/2023. CLINICAL HISTORY: 58 year old female.  Pulmonary embolism (PE) suspected, high prob. FINDINGS: PULMONARY ARTERIES: Pulmonary arteries are adequately opacified for evaluation. No pulmonary embolism. Main pulmonary artery is normal in caliber. MEDIASTINUM: Excellent pulmonary artery contrast timing. No left heart or aortic contrast. Chronic cardiomegaly. Reflux of right heart contrast into dilated hepatic veins and hepatic IVC. No pericardial effusion. Calcified thoracic aortic atherosclerosis. Tortuous great vessels. There is no acute abnormality of the thoracic aorta. LYMPH NODES: No mediastinal, hilar or axillary lymphadenopathy. LUNGS AND PLEURA: Lower lung volumes compared to 05/04/2023. Major airways remain patent. Small layering right pleural effusion. Mild generalized increased pulmonary ground glass opacity, except for gas trapping in the medial basal segment of the left lower  lobe. Bilateral lung base predominant septal thickening, streaky infrahilar opacity, trace fluid or thickening along the right minor fissure. No convincing consolidation. Constellation favors acute pulmonary edema with small volume pleural effusions. No pneumothorax. UPPER ABDOMEN: Other visible upper abdominal non-contrast viscera remarkable for renal vascular calcifications and right renal atrophy. SOFT TISSUES AND BONES: No acute bone or soft tissue abnormality. IMPRESSION: 1. No acute pulmonary embolism identified. 2. Findings favor a degree of right heart dysfunction with acute pulmonary edema and small volume pleural fluid. Chronic cardiomegaly. Electronically signed by: Helayne Hurst MD 01/16/2024 06:00 AM EDT RP Workstation: [id number]   DG Chest 2 View Result Date: 01/16/2024 EXAM: 2 VIEW(S) XRAY OF THE CHEST 01/16/2024 03:47:00 AM COMPARISON: CT chest dated 05/04/2023. CLINICAL HISTORY: Cough, SOB FINDINGS: LUNGS AND PLEURA: Multifocal patchy opacities, right mid and lower lung predominant, favoring multifocal pneumonia over interstitial edema. No pleural effusion. No pneumothorax. HEART AND MEDIASTINUM: Mild cardiomegaly. Thoracic aortic atherosclerosis. BONES AND SOFT TISSUES: No acute osseous abnormality. IMPRESSION: 1. Multifocal patchy opacities, right mid/lower lung predominant, favoring multifocal pneumonia over interstitial edema. 2. Mild cardiomegaly. Electronically signed by: Pinkie Pebbles MD 01/16/2024 03:52 AM EDT RP Workstation: [id number]     Assessment and Plan:  Acute on chronic diastolic HF Pulmonary HTN - Acute on chronic diastolic heart failure exacerbated by atrial fibrillation and hypertension. -Diuresing with IV Lasix , now scheduled 60 mg IV twice daily, continue.  JVP elevated to the mid one third of the neck sitting upright, RA pressure estimate 15 to 20 mmHg by echo. -Elevated pulmonary pressure may be secondary to acute diastolic heart failure, however with  underlying COPD and untreated OSA, multiple etiologies may be present.  Would consider right heart catheterization after she is euvolemic to better understand primary driver of elevated pulmonary pressure noted on echo.  A-fib -For atrial fibrillation she is on IV diltiazem  which is controlling rates well, continue through diuresis. -History of ablative therapy 2018, recurrent episodes of A-fib, at times with LA appendage thrombus.  Intended to take Xarelto  20 mg daily with some medication inconsistencies endorsed.  Hypertension -On spironolactone  25 mg daily, otherwise blood pressure control with diltiazem  IV and Lasix .  Cocaine use -Complicating hypertension and atrial fibrillation, encourage cessation.  CKD stage III A -Follows with [facility].  Creatinine currently stable with diuresis, follow.  COPD -Possible concurrent flare, on inhaler therapy.  Receiving antibiotics.  Coronary calcifications and aortic atherosclerosis -On atorvastatin  40 mg daily -On DOAC at home -No reported chest pain    Risk Assessment/Risk Scores:       New York  Heart Association (NYHA) Functional Class NYHA Class III  CHA2DS2-VASc Score =   6  This indicates a  % annual risk of stroke. The patient's score is based upon:         For questions  or updates, please contact [facility] Please consult [email or web address] for contact info under      Signed, Soyla DELENA Merck, MD  01/16/2024 11:09 AM

## 2024-01-16 NOTE — H&P (Signed)
 History and Physical    Patient: Morgan Bean FMW:[id number] DOB: Feb 25, 1966 DOA: 01/16/2024 DOS: the patient was seen and examined on 01/16/2024 PCP: Delbert Clam, MD  Patient coming from: Home  Chief Complaint:  Chief Complaint  Patient presents with   Shortness of Breath   Tachycardia   HPI: Morgan Bean is a 58 y.o. 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  Patient reports progressive shortness of breath for about a month.  Her breathing gotten worse and she presented to ED.  Also reports productive cough with yellowish phlegm.  She reports two-pillow orthopnea that has not changed lately.  She denies lower extremity edema.  Denies fever, chills, runny nose, sore throat, nausea, vomiting or abdominal pain.  Denies UTI symptoms.  She denies focal neuro symptoms.  Reports ongoing cocaine use.  Last use was about 4 days ago.  Quit smoking cigarette about a week ago.  Inconsistently takes her medications.  It seems like patient was at cardiology office about 2 days ago but left the clinic without being seen.  In ED, BP 185/160 but improved to 158/111.  In RVR to 127 but improved to 106.  RR in upper 20s.  Brief desaturation to 85%.  Cr 1.38.  BUN 31.  Bicarb 18.  AG 13.  Glucose 107.  proBNP 5200.  Troponin 34 and 36.  CBC unremarkable.  CXR suggested possible multifocal pneumonia or edema..  CT angio chest negative for PE but concerning for RV failure and pulmonary edema.  Patient was started on ceftriaxone, Zithromax , Lasix  and Cardizem  push followed by infusion.  Admission requested for A-fib with RVR and possible CHF exacerbation.   Review of Systems: As mentioned in the history of present illness. All other systems reviewed and are negative. Past Medical History:  Diagnosis Date   Anemia of other chronic disease 11/13/2013   Anginal pain    none in  past year   Anxiety    Asthma    patient denies   Autoimmune disease 12/27/2018   Candidiasis 06/23/2013   Chronic diastolic CHF (congestive heart failure) ([name])    Constipation    Coronary artery disease    Lexiscan  myoview  (10/12) with significant ST depression upon Lexiscan  injection but no ischemia or infarction on perfusion images. Left heart cath (10/12): 30% mid LAD, 30% ostial D1, 50% ostial D2.     Cough 11/13/2013   Depression    Headache 02/22/2014   Hx of cardiovascular stress test    Lexiscan  Myoview  (2/16): Breast attenuation artifact, no ischemia, EF 64%; low risk   Hyperlipemia    Hypertension    Iron deficiency    hx of   Leukocytoclastic vasculitis ([name])    Rash across lower body, occurred in 9/11, diagnosed by skin biopsy. ANA positive. Thought to be secondary to cocaine use.   Leukopenia 03/21/2013   Lymphadenopathy 03/21/2013   Lymphadenopathy 01/30/2014   Mental disorder    Pancytopenia ([name])    Pancytopenia, acquired ([name]) 07/16/2015   Polysubstance abuse ([name])    Prior cocaine   Pulmonary HTN ([name])    Echo (9/11) with EF 65%, mild LVH, mild AI, mild MR, moderate to possibly severe TR with PA systolic pressure 52 mmHg. Echo (10/12): severe LV hypertrophy, EF 60-65%, mild MR, mild AI, moderate to severe tricuspid regurgitation, PA systolic pressure 38 mmHg.  Echo 4/14: moderate LVH, EF 65%, normal wall motion,  diastolic dysfunction, mild AI, mild MR   Shortness of breath    a. PFTs 5/14:  FEV1/FVC 99% predicted; FVC 60% predicted; DLCO mildly reduced, mild restriction, air trapping.;  b.  seen by pulmo (Dr. Corrie) 5/14: mainly upper airway symptoms - ACE d/c'd (sx's better)   Stroke [facility]) 2010ish   Syphilis 04/25/2013   Tobacco abuse    Tricuspid regurgitation    Unspecified deficiency anemia 03/29/2013   Past Surgical History:  Procedure Laterality Date   ANKLE SURGERY     right   BONE MARROW ASPIRATION     CARDIAC CATHETERIZATION     CARDIAC  CATHETERIZATION N/A 04/23/2015   Procedure: Right Heart Cath;  Surgeon: Ezra GORMAN Shuck, MD;  Location: [facility] INVASIVE CV LAB;  Service: Cardiovascular;  Laterality: N/A;   CARDIOVERSION N/A 03/11/2018   Procedure: CARDIOVERSION;  Surgeon: Okey Vina GAILS, MD;  Location: [facility] ENDOSCOPY;  Service: Cardiovascular;  Laterality: N/A;   I & D EXTREMITY  08/09/2011   Procedure: IRRIGATION AND DEBRIDEMENT EXTREMITY;  Surgeon: Deward GORMAN Curvin DOUGLAS, MD;  Location: [facility] OR;  Service: General;  Laterality: Left;  i & D Left axilla abscess   LYMPH NODE BIOPSY N/A 04/05/2013   Procedure: EXCISIONAL BIOPSY RIGHT SUBMANDIBULAR NODE, NASAL ENDOSCOPIC WITH BIOPSY NASAL PHARYNX;  Surgeon: Marlyce Finer, MD;  Location: [facility] OR;  Service: ENT;  Laterality: N/A;   MULTIPLE EXTRACTIONS WITH ALVEOLOPLASTY N/A 03/03/2019   Procedure: MULTIPLE EXTRACTION WITH ALVEOLOPLASTY;  Surgeon: Sheryle Hamilton, DDS;  Location: [facility] OR;  Service: Oral Surgery;  Laterality: N/A;   TEE WITHOUT CARDIOVERSION N/A 03/11/2018   Procedure: TRANSESOPHAGEAL ECHOCARDIOGRAM (TEE);  Surgeon: Okey Vina GAILS, MD;  Location: [facility] ENDOSCOPY;  Service: Cardiovascular;  Laterality: N/A;   TEE WITHOUT CARDIOVERSION N/A 12/27/2020   Procedure: TRANSESOPHAGEAL ECHOCARDIOGRAM (TEE);  Surgeon: Tobb, Kardie, DO;  Location: [facility] ENDOSCOPY;  Service: Cardiovascular;  Laterality: N/A;   Social History:  reports that she has quit smoking. Her smoking use included cigarettes. She has a 19.5 pack-year smoking history. She has been exposed to tobacco smoke. She has never used smokeless tobacco. She reports that she does not currently use alcohol. She reports current drug use. Frequency: 7.00 times per week. Drug: Crack cocaine.  Allergies  Allergen Reactions   Ciprofloxacin Itching   Cyprodenate     Other Reaction(s): Not available    Family History  Problem Relation Age of Onset   Coronary artery disease Mother    Diabetes Mother    Diabetes Father    Hypertension Father     Coronary artery disease Father    Heart attack Father    Breast cancer Maternal Grandmother    Diabetes Maternal Grandmother    Heart attack Maternal Grandmother    Stroke Maternal Grandmother    Asthma Grandson     Prior to Admission medications   Medication Sig Start Date End Date Taking? Authorizing Provider  albuterol  (VENTOLIN  HFA) 108 (90 Base) MCG/ACT inhaler Inhale 2 puffs into the lungs every 6 (six) hours as needed for wheezing or shortness of breath. 04/27/23   Newlin, Enobong, MD  atorvastatin  (LIPITOR ) 80 MG tablet Take 1 tablet (80 mg total) by mouth at bedtime. 10/25/23   Newlin, Enobong, MD  Cholecalciferol  (VITAMIN D ) 50 MCG (2000 UT) CAPS Take 1 capsule (2,000 Units total) by mouth daily. 08/21/22   Newlin, Enobong, MD  diltiazem  (CARDIZEM  CD) 360 MG 24 hr capsule Take 1 capsule (360 mg total) by mouth daily. Please call office to schedule  appt with cardiologist for continued refills. [id number] 10/25/23   Raford Riggs, MD  DULoxetine  (CYMBALTA ) 60 MG capsule Take 1 capsule (60 mg total) by mouth daily. 10/25/23   Newlin, Enobong, MD  ferrous sulfate  325 (65 FE) MG tablet Take 325 mg by mouth daily with breakfast.    [provider]  fluticasone  (FLONASE ) 50 MCG/ACT nasal spray Place 2 sprays into both nostrils daily. 02/17/23     Fluticasone -Umeclidin-Vilant (TRELEGY ELLIPTA ) 100-62.5-25 MCG/ACT AEPB Inhale 1 puff into the lungs daily. 10/25/23   Newlin, Enobong, MD  furosemide  (LASIX ) 40 MG tablet Take 1 tablet (40 mg total) by mouth daily. Please call office to schedule appt with cardiologist for continued refills. [id number] 10/25/23   Vannie Reche RAMAN, NP  hydrOXYzine  (ATARAX ) 50 MG tablet Take 1 tablet (50 mg total) by mouth every 8 (eight) hours as needed. 01/14/24   Newlin, Enobong, MD  lisinopril  (ZESTRIL ) 10 MG tablet Take 1 tablet (10 mg total) by mouth daily. 10/25/23   Newlin, Enobong, MD  omeprazole  (PRILOSEC) 40 MG capsule Take 1 capsule (40 mg total) by mouth  daily. Take with water, wait 30-60 minutes after taking before eating or drinking anything but water 11/12/22     potassium chloride  SA (KLOR-CON  M) 20 MEQ tablet Take 1 tablet (20 mEq total) by mouth daily. Please call office to schedule appt with cardiologist for continued refills. [id number] 10/25/23   Raford Riggs, MD  Rivaroxaban  (XARELTO ) 15 MG TABS tablet Take 1 tablet (15 mg total) by mouth daily with supper. 10/25/23   Raford Riggs, MD  sodium bicarbonate  650 MG tablet Take 1 tablet (650 mg total) by mouth 2 (two) times daily. 08/31/22   Dolan Mateo Larger, MD  spironolactone  (ALDACTONE ) 25 MG tablet Take 1 tablet (25 mg total) by mouth once daily. Please call office to schedule appt with cardiologist for continued refills. [id number] 10/25/23   Raford Riggs, MD    Physical Exam: Vitals:   01/16/24 0610 01/16/24 0620 01/16/24 0625 01/16/24 0700  BP:    (!) 158/111  Pulse: 95 83 99   Resp: (!) 29 15 13  (!) 39  Temp:      TempSrc:      SpO2: 98% 92%  98%   GENERAL: No apparent distress.  Nontoxic. HEENT: MMM.  Vision and hearing grossly intact.  NECK: Supple.  Prominent JVD sitting upright. RESP:  No IWOB.  Fair aeration bilaterally.  Bibasilar rales. CVS: Irregular rhythm.  HR in 110s.  Heart sounds normal.  ABD/GI/GU: BS+. Abd soft, NTND.  MSK/EXT:   No apparent deformity. Moves extremities. No edema.  SKIN: no apparent skin lesion or wound NEURO: Sleepy but wakes to voice.  Oriented x 4.  No focal neurodeficit. PSYCH: Somewhat restless. Data Reviewed: See HPI Assessment and Plan: Persistent A-fib with RVR: In RVR to 120s and 130s.  Started on Cardizem  infusion after Cardizem  push.  Suspect this to be due to noncompliance with meds and ongoing polysubstance use.  She also have significant prominent JVD on exam concerning for heart failure. 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  Now with prominent JVD even sitting upright.  proBNP elevated to 5200.  CXR with pulmonary vascular congestion/edema.  Supposedly on Lasix  and Aldactone  but not compliant.  Received IV Lasix  40 mg x 1. - Echocardiogram as above. - Strict intake and  output, daily weight, renal functions and electrolytes - Continue IV Lasix  twice daily starting this evening.  Uncontrolled hypertension: Initial BP 185/160. BP elevated but improved - Cardizem  drip as above  CKD-3A: Creatinine better than baseline. -Continue monitoring  Chronic COPD: Suspect respiratory symptoms from CHF versus COPD exacerbation. - Breztri for home Trelegy Ellipta  - DuoNebs as needed  Hypoxemia-likely due to CHF and A-fib. - Management as above.  History of CVA: - Continue home Lipitor   Polysubstance use: Cocaine and tobacco.  Last cocaine use 4 days ago.  Quit smoking about a week ago. - Check UDS - Encouraged cessation.  Noncompliance - Counseled.     Advance Care Planning:   Code Status: Full Code discussed with patient  Consults: Cardiology  Family Communication: None at bedside  Severity of Illness: The appropriate patient status for this patient is OBSERVATION. Observation status is judged to be reasonable and necessary in order to provide the required intensity of service to ensure the patient's safety. The patient's presenting symptoms, physical exam findings, and initial radiographic and laboratory data in the context of their medical condition is felt to place them at decreased risk for further clinical deterioration. Furthermore, it is anticipated that the patient will be medically stable for discharge from the hospital within 2 midnights of admission.   Author: Mignon ONEIDA Bump, MD 01/16/2024 8:26 AM  For on call review [email or web address].

## 2024-01-16 NOTE — ED Provider Notes (Signed)
 [place] EMERGENCY DEPARTMENT AT [facility] Provider Note   CSN: [id number] Arrival date & time: 01/16/24  9743     Patient presents with: Shortness of Breath and Tachycardia   Morgan Bean is a 58 y.o. female.   The history is provided by the patient.  Shortness of Breath  She has history of hypertension, hyperlipidemia, coronary artery disease, diastolic heart failure, stroke, COPD, pulmonary hypertension and comes in because of worsening shortness of breath over the last several months.  She does have history of cocaine abuse and she is a cigarette smoker and states that she just got out of a living situation which was conducive to drug and tobacco use and states that she has not used either in the last 4 days.  He does endorse a cough which is productive of some thick yellow sputum but denies fever or chills.  She is very unclear about medication stating that there was some difficulty getting some medications but states that she has been taking her anticoagulant and her cardiac medications.    Prior to Admission medications   Medication Sig Start Date End Date Taking? Authorizing Provider  albuterol  (VENTOLIN  HFA) 108 (90 Base) MCG/ACT inhaler Inhale 2 puffs into the lungs every 6 (six) hours as needed for wheezing or shortness of breath. 04/27/23   Newlin, Enobong, MD  atorvastatin  (LIPITOR ) 80 MG tablet Take 1 tablet (80 mg total) by mouth at bedtime. 10/25/23   Newlin, Enobong, MD  Cholecalciferol  (VITAMIN D ) 50 MCG (2000 UT) CAPS Take 1 capsule (2,000 Units total) by mouth daily. 08/21/22   Newlin, Enobong, MD  diltiazem  (CARDIZEM  CD) 360 MG 24 hr capsule Take 1 capsule (360 mg total) by mouth daily. Please call office to schedule appt with cardiologist for continued refills. [id number] 10/25/23   Raford Riggs, MD  DULoxetine  (CYMBALTA ) 60 MG capsule Take 1 capsule (60 mg total) by mouth daily. 10/25/23   Newlin, Enobong, MD  ferrous sulfate  325 (65 FE) MG tablet Take  325 mg by mouth daily with breakfast.    [provider]  fluticasone  (FLONASE ) 50 MCG/ACT nasal spray Place 2 sprays into both nostrils daily. 02/17/23     Fluticasone -Umeclidin-Vilant (TRELEGY ELLIPTA ) 100-62.5-25 MCG/ACT AEPB Inhale 1 puff into the lungs daily. 10/25/23   Newlin, Enobong, MD  furosemide  (LASIX ) 40 MG tablet Take 1 tablet (40 mg total) by mouth daily. Please call office to schedule appt with cardiologist for continued refills. [id number] 10/25/23   Vannie Reche RAMAN, NP  hydrOXYzine  (ATARAX ) 50 MG tablet Take 1 tablet (50 mg total) by mouth every 8 (eight) hours as needed. 01/14/24   Newlin, Enobong, MD  lisinopril  (ZESTRIL ) 10 MG tablet Take 1 tablet (10 mg total) by mouth daily. 10/25/23   Newlin, Enobong, MD  omeprazole  (PRILOSEC) 40 MG capsule Take 1 capsule (40 mg total) by mouth daily. Take with water, wait 30-60 minutes after taking before eating or drinking anything but water 11/12/22     potassium chloride  SA (KLOR-CON  M) 20 MEQ tablet Take 1 tablet (20 mEq total) by mouth daily. Please call office to schedule appt with cardiologist for continued refills. [phone] 10/25/23   Raford Riggs, MD  Rivaroxaban  (XARELTO ) 15 MG TABS tablet Take 1 tablet (15 mg total) by mouth daily with supper. 10/25/23   Raford Riggs, MD  sodium bicarbonate  650 MG tablet Take 1 tablet (650 mg total) by mouth 2 (two) times daily. 08/31/22   Dolan Mateo Larger, MD  spironolactone  (ALDACTONE ) 25 MG  tablet Take 1 tablet (25 mg total) by mouth once daily. Please call office to schedule appt with cardiologist for continued refills. [id number] 10/25/23   Raford Riggs, MD    Allergies: Ciprofloxacin and Cyprodenate    Review of Systems  Respiratory:  Positive for shortness of breath.   All other systems reviewed and are negative.   Updated Vital Signs BP (!) 184/157   Pulse 97   Temp 98.1 F (36.7 C) (Oral)   Resp (!) 28   LMP 04/24/2013 Comment: states periods come and go  SpO2  100%   Physical Exam Vitals and nursing note reviewed.   58 year old female, moderately dyspneic at rest, but is in no acute distress.  She is able to complete a sentence without stopping for breath.  Vital signs are significant for elevated heart rate, respiratory rate, blood pressure. Oxygen saturation is 85%, which is hypoxic. Head is normocephalic and atraumatic. PERRLA, EOMI. Oropharynx is clear. Neck is nontender and supple without adenopathy.  JVD is present. Lungs are clear without rales, wheezes, or rhonchi. Chest is nontender. Heart is tachycardic and irregular without murmur. Abdomen is soft, flat, nontender. Extremities have no cyanosis or edema. Skin is warm and dry without rash. Neurologic: Awake and alert, moves all extremities equally.  (all labs ordered are listed, but only abnormal results are displayed) Labs Reviewed  BASIC METABOLIC PANEL WITH GFR  CBC  PRO BRAIN NATRIURETIC PEPTIDE  TROPONIN T, HIGH SENSITIVITY    EKG: EKG Interpretation Date/Time:  Sunday January 16 2024 03:12:45 EDT Ventricular Rate:  139 PR Interval:    QRS Duration:  114 QT Interval:  359 QTC Calculation: 546 R Axis:   66  Text Interpretation: Atrial fibrillation LVH with IVCD and secondary repol abnrm Prolonged QT interval When compared with ECG of 01/14/2024, HEART RATE has increased Premature ventricular complexes are no longer present Confirmed by Raford Lenis ([id number]) on 01/16/2024 3:43:37 AM  Radiology: [facility] Chest 2 View Result Date: 01/16/2024 EXAM: 2 VIEW(S) XRAY OF THE CHEST 01/16/2024 03:47:00 AM COMPARISON: CT chest dated 05/04/2023. CLINICAL HISTORY: Cough, SOB FINDINGS: LUNGS AND PLEURA: Multifocal patchy opacities, right mid and lower lung predominant, favoring multifocal pneumonia over interstitial edema. No pleural effusion. No pneumothorax. HEART AND MEDIASTINUM: Mild cardiomegaly. Thoracic aortic atherosclerosis. BONES AND SOFT TISSUES: No acute osseous abnormality.  IMPRESSION: 1. Multifocal patchy opacities, right mid/lower lung predominant, favoring multifocal pneumonia over interstitial edema. 2. Mild cardiomegaly. Electronically signed by: Pinkie Pebbles MD 01/16/2024 03:52 AM EDT RP Workstation: [id number]     Procedures  Cardiac monitor shows atrial fibrillation with rapid ventricular response, per my interpretation. Medications Ordered in the ED - No data to display                                  Medical Decision Making Amount and/or Complexity of Data Reviewed Labs: ordered. Radiology: ordered.  Risk OTC drugs. Prescription drug management. Decision regarding hospitalization.   Progressive dyspnea.  This is a presentation with a wide range of treatment options and carries with it a high risk of morbidity and complications.  Differential diagnosis includes, but is not limited to, COPD exacerbation, pneumonia, heart failure exacerbation, atrial fibrillation with rapid ventricular response, pulmonary embolism.  I have forwarded a dose of aspirin  and diltiazem  bolus and infusion to try to get adequate rate control.  I have reviewed her electrocardiogram and my interpretation is atrial fibrillation with rapid  ventricular response, left ventricular hypertrophy with secondary repolarization changes, rate significantly faster than most recent electrocardiogram.  Chest x-ray shows multifocal patchy opacities most prominent in the right mid and lower lung fields most likely pneumonia but possibly heart failure.  I have ordered IV antibiotics for pneumonia.  I have reviewed her past records, she is followed by cardiology.  Left without being seen.  Most recent scheduled office visit on 01/14/2024.  She was admitted on 04/13/2021 for acute on chronic heart failure.  Echocardiogram on 04/14/2021 showed left ventricular ejection fraction 60-65% with normal left ventricular diastolic parameters.  I have reviewed her laboratory tests, and my interpretation is  renal insufficiency which is actually improved compared with baseline, elevated random glucose level, mildly elevated troponin likely from demand ischemia, markedly elevated BNP consistent with heart failure, normal CBC.  Because of concern that she has not been compliant with her anticoagulation, I did order CT angiogram of the chest which showed no evidence of pulmonary embolism and likely pulmonary edema.  I have independently viewed the images, and agree with radiologist's interpretation.  I have ordered intravenous furosemide .  She required a second dose of diltiazem , but rate is now controlled with heart rate in the 80s.  She still is feeling very dyspneic, will need to be admitted.  I have discussed the case with Dr. Kathrin of [facility], who agrees to admit the patient.  CRITICAL CARE Performed by: Alm Lias Total critical care time: 100 minutes Critical care time was exclusive of separately billable procedures and treating other patients. Critical care was necessary to treat or prevent imminent or life-threatening deterioration. Critical care was time spent personally by me on the following activities: development of treatment plan with patient and/or surrogate as well as nursing, discussions with consultants, evaluation of patient's response to treatment, examination of patient, obtaining history from patient or surrogate, ordering and performing treatments and interventions, ordering and review of laboratory studies, ordering and review of radiographic studies, pulse oximetry and re-evaluation of patient's condition.     Final diagnoses:  Acute respiratory failure with hypoxemia ([name])  Atrial fibrillation with rapid ventricular response ([name])  Acute on chronic diastolic heart failure ([name])  Renal insufficiency  Elevated random blood glucose level    ED Discharge Orders     None          Lias Alm, MD 01/16/24 [phone]

## 2024-01-17 ENCOUNTER — Other Ambulatory Visit: Payer: Self-pay

## 2024-01-17 ENCOUNTER — Other Ambulatory Visit (HOSPITAL_COMMUNITY): Payer: Self-pay

## 2024-01-17 ENCOUNTER — Telehealth: Payer: Self-pay | Admitting: Pharmacy Technician

## 2024-01-17 ENCOUNTER — Other Ambulatory Visit (HOSPITAL_BASED_OUTPATIENT_CLINIC_OR_DEPARTMENT_OTHER): Payer: Self-pay

## 2024-01-17 ENCOUNTER — Encounter (HOSPITAL_COMMUNITY): Payer: Self-pay | Admitting: Internal Medicine

## 2024-01-17 DIAGNOSIS — I272 Pulmonary hypertension, unspecified: Secondary | ICD-10-CM | POA: Diagnosis present

## 2024-01-17 DIAGNOSIS — I700 Atherosclerosis of aorta: Secondary | ICD-10-CM | POA: Diagnosis present

## 2024-01-17 DIAGNOSIS — I5033 Acute on chronic diastolic (congestive) heart failure: Secondary | ICD-10-CM | POA: Diagnosis present

## 2024-01-17 DIAGNOSIS — Z8249 Family history of ischemic heart disease and other diseases of the circulatory system: Secondary | ICD-10-CM | POA: Diagnosis not present

## 2024-01-17 DIAGNOSIS — D631 Anemia in chronic kidney disease: Secondary | ICD-10-CM | POA: Diagnosis present

## 2024-01-17 DIAGNOSIS — J9601 Acute respiratory failure with hypoxia: Secondary | ICD-10-CM | POA: Diagnosis present

## 2024-01-17 DIAGNOSIS — Z91199 Patient's noncompliance with other medical treatment and regimen due to unspecified reason: Secondary | ICD-10-CM | POA: Diagnosis not present

## 2024-01-17 DIAGNOSIS — I4891 Unspecified atrial fibrillation: Secondary | ICD-10-CM | POA: Diagnosis not present

## 2024-01-17 DIAGNOSIS — Z7151 Drug abuse counseling and surveillance of drug abuser: Secondary | ICD-10-CM | POA: Diagnosis not present

## 2024-01-17 DIAGNOSIS — N1831 Chronic kidney disease, stage 3a: Secondary | ICD-10-CM | POA: Diagnosis present

## 2024-01-17 DIAGNOSIS — Z7901 Long term (current) use of anticoagulants: Secondary | ICD-10-CM | POA: Diagnosis not present

## 2024-01-17 DIAGNOSIS — Z881 Allergy status to other antibiotic agents status: Secondary | ICD-10-CM | POA: Diagnosis not present

## 2024-01-17 DIAGNOSIS — E785 Hyperlipidemia, unspecified: Secondary | ICD-10-CM | POA: Diagnosis present

## 2024-01-17 DIAGNOSIS — J4489 Other specified chronic obstructive pulmonary disease: Secondary | ICD-10-CM | POA: Diagnosis present

## 2024-01-17 DIAGNOSIS — I480 Paroxysmal atrial fibrillation: Secondary | ICD-10-CM | POA: Diagnosis not present

## 2024-01-17 DIAGNOSIS — I130 Hypertensive heart and chronic kidney disease with heart failure and stage 1 through stage 4 chronic kidney disease, or unspecified chronic kidney disease: Secondary | ICD-10-CM | POA: Diagnosis present

## 2024-01-17 DIAGNOSIS — I472 Ventricular tachycardia, unspecified: Secondary | ICD-10-CM | POA: Diagnosis present

## 2024-01-17 DIAGNOSIS — Z8673 Personal history of transient ischemic attack (TIA), and cerebral infarction without residual deficits: Secondary | ICD-10-CM | POA: Diagnosis not present

## 2024-01-17 DIAGNOSIS — Z888 Allergy status to other drugs, medicaments and biological substances status: Secondary | ICD-10-CM | POA: Diagnosis not present

## 2024-01-17 DIAGNOSIS — I2510 Atherosclerotic heart disease of native coronary artery without angina pectoris: Secondary | ICD-10-CM | POA: Diagnosis present

## 2024-01-17 DIAGNOSIS — F17210 Nicotine dependence, cigarettes, uncomplicated: Secondary | ICD-10-CM | POA: Diagnosis present

## 2024-01-17 DIAGNOSIS — I4819 Other persistent atrial fibrillation: Secondary | ICD-10-CM | POA: Diagnosis present

## 2024-01-17 DIAGNOSIS — Z23 Encounter for immunization: Secondary | ICD-10-CM | POA: Diagnosis not present

## 2024-01-17 DIAGNOSIS — F1410 Cocaine abuse, uncomplicated: Secondary | ICD-10-CM | POA: Diagnosis present

## 2024-01-17 DIAGNOSIS — Z79899 Other long term (current) drug therapy: Secondary | ICD-10-CM | POA: Diagnosis not present

## 2024-01-17 DIAGNOSIS — G4733 Obstructive sleep apnea (adult) (pediatric): Secondary | ICD-10-CM | POA: Diagnosis present

## 2024-01-17 LAB — CBC
HCT: 39.9 % (ref 36.0–46.0)
Hemoglobin: 12.7 g/dL (ref 12.0–15.0)
MCH: 27.1 pg (ref 26.0–34.0)
MCHC: 31.8 g/dL (ref 30.0–36.0)
MCV: 85.3 fL (ref 80.0–100.0)
Platelets: 198 K/uL (ref 150–400)
RBC: 4.68 MIL/uL (ref 3.87–5.11)
RDW: 15.4 % (ref 11.5–15.5)
WBC: 5.2 K/uL (ref 4.0–10.5)
nRBC: 0 % (ref 0.0–0.2)

## 2024-01-17 LAB — COMPREHENSIVE METABOLIC PANEL WITH GFR
ALT: 33 U/L (ref 0–44)
AST: 32 U/L (ref 15–41)
Albumin: 3.7 g/dL (ref 3.5–5.0)
Alkaline Phosphatase: 87 U/L (ref 38–126)
Anion gap: 13 (ref 5–15)
BUN: 25 mg/dL — ABNORMAL HIGH (ref 6–20)
CO2: 23 mmol/L (ref 22–32)
Calcium: 9 mg/dL (ref 8.9–10.3)
Chloride: 106 mmol/L (ref 98–111)
Creatinine, Ser: 1.42 mg/dL — ABNORMAL HIGH (ref 0.44–1.00)
GFR, Estimated: 42 mL/min — ABNORMAL LOW (ref >60)
Glucose, Bld: 98 mg/dL (ref 70–99)
Potassium: 3.2 mmol/L — ABNORMAL LOW (ref 3.5–5.1)
Sodium: 141 mmol/L (ref 135–145)
Total Bilirubin: 0.8 mg/dL (ref 0.0–1.2)
Total Protein: 7 g/dL (ref 6.5–8.1)

## 2024-01-17 MED ORDER — HYDRALAZINE HCL 10 MG PO TABS
10.0000 mg | ORAL_TABLET | Freq: Four times a day (QID) | ORAL | Status: DC | PRN
  Administered 2024-01-17 – 2024-01-20 (×3): 10 mg via ORAL
  Filled 2024-01-17 (×3): qty 1

## 2024-01-17 MED ORDER — LISINOPRIL 20 MG PO TABS
20.0000 mg | ORAL_TABLET | Freq: Every day | ORAL | Status: DC
  Administered 2024-01-18 – 2024-01-20 (×3): 20 mg via ORAL
  Filled 2024-01-17 (×3): qty 1

## 2024-01-17 MED ORDER — FUROSEMIDE 40 MG PO TABS
40.0000 mg | ORAL_TABLET | Freq: Every day | ORAL | 0 refills | Status: DC
  Filled 2024-01-17: qty 90, 90d supply, fill #0

## 2024-01-17 MED ORDER — POTASSIUM CHLORIDE CRYS ER 20 MEQ PO TBCR
40.0000 meq | EXTENDED_RELEASE_TABLET | Freq: Once | ORAL | Status: AC
  Administered 2024-01-17: 40 meq via ORAL
  Filled 2024-01-17: qty 2

## 2024-01-17 MED ORDER — POTASSIUM CHLORIDE CRYS ER 20 MEQ PO TBCR: 40.0000 meq | EXTENDED_RELEASE_TABLET | ORAL | Status: DC

## 2024-01-17 MED ORDER — INFLUENZA VIRUS VACC SPLIT PF (FLUZONE) 0.5 ML IM SUSY
0.5000 mL | PREFILLED_SYRINGE | INTRAMUSCULAR | Status: AC
  Administered 2024-01-18: 0.5 mL via INTRAMUSCULAR
  Filled 2024-01-17: qty 0.5

## 2024-01-17 MED ORDER — LISINOPRIL 5 MG PO TABS
5.0000 mg | ORAL_TABLET | Freq: Every day | ORAL | Status: DC
  Administered 2024-01-17: 5 mg via ORAL
  Filled 2024-01-17: qty 2

## 2024-01-17 MED ORDER — DILTIAZEM HCL ER COATED BEADS 180 MG PO CP24
360.0000 mg | ORAL_CAPSULE | Freq: Every day | ORAL | Status: DC
  Administered 2024-01-17 – 2024-01-20 (×4): 360 mg via ORAL
  Filled 2024-01-17 (×4): qty 2

## 2024-01-17 MED ORDER — FLUTICASONE PROPIONATE 50 MCG/ACT NA SUSP: 1.0000 | Freq: Every day | NASAL | Status: DC | PRN

## 2024-01-17 MED ORDER — SODIUM BICARBONATE 650 MG PO TABS
650.0000 mg | ORAL_TABLET | Freq: Two times a day (BID) | ORAL | Status: DC
  Administered 2024-01-17 – 2024-01-20 (×7): 650 mg via ORAL
  Filled 2024-01-17 (×7): qty 1

## 2024-01-17 NOTE — Telephone Encounter (Signed)
 Duplicate message

## 2024-01-17 NOTE — Telephone Encounter (Signed)
 Routing to PCP for review.

## 2024-01-17 NOTE — Progress Notes (Signed)
 Assumed care of this patient agree with previous assessment

## 2024-01-17 NOTE — Progress Notes (Addendum)
 Rounding Note   Patient Name: Morgan Bean Date of Encounter: 01/17/2024  [facility] Cardiologist: Annabella Scarce, MD   Subjective Feeling well.  Breathing improved.   Scheduled Meds:  atorvastatin   80 mg Oral Daily   budesonide -glycopyrrolate-formoterol   2 puff Inhalation BID   Chlorhexidine  Gluconate Cloth  6 each Topical Daily   diltiazem   360 mg Oral Daily   DULoxetine   60 mg Oral Daily   furosemide   60 mg Intravenous BID   [START ON 01/18/2024] influenza vac split trivalent PF  0.5 mL Intramuscular Tomorrow-1000   lisinopril   5 mg Oral Daily   pantoprazole   40 mg Oral Daily   Rivaroxaban   15 mg Oral Q supper   sodium bicarbonate   650 mg Oral BID   spironolactone   25 mg Oral Daily   Continuous Infusions:  diltiazem  (CARDIZEM ) infusion Stopped (01/17/24 1243)   PRN Meds: acetaminophen  **OR** acetaminophen , fluticasone , hydrOXYzine , mouth rinse   Vital Signs  Vitals:   01/17/24 0812 01/17/24 0900 01/17/24 1000 01/17/24 1155  BP:      Pulse:  86 72   Resp:  (!) 25 (!) 27   Temp:    98 F (36.7 C)  TempSrc:    Oral  SpO2: 95% 99% 92%   Weight:      Height:        Intake/Output Summary (Last 24 hours) at 01/17/2024 1348 Last data filed at 01/17/2024 1100 Gross per 24 hour  Intake 328.86 ml  Output 1200 ml  Net -871.14 ml      01/17/2024    5:00 AM 01/16/2024    9:07 AM 01/14/2024   11:21 AM  Last 3 Weights  Weight (lbs) 175 lb 7.8 oz 175 lb 11.3 oz 177 lb 14.4 oz  Weight (kg) 79.6 kg 79.7 kg 80.695 kg      Telemetry Atrial fibrillation.  Rate <100 bpm.  8 beats NSVT.  PVCs - Personally Reviewed  ECG  N/a - Personally Reviewed  Physical Exam  VS:  BP (!) 149/133   Pulse 72   Temp 98 F (36.7 C) (Oral)   Resp (!) 27   Ht 5' 6 (1.676 m)   Wt 79.6 kg   LMP 04/24/2013 Comment: states periods come and go  SpO2 92%   BMI 28.32 kg/m  , BMI Body mass index is 28.32 kg/m. GENERAL:  Well appearing.  No acute distress HEENT:  Pupils equal round and reactive, fundi not visualized, oral mucosa unremarkable NECK:  No jugular venous distention, waveform within normal limits, carotid upstroke brisk and symmetric, no bruits, no thyromegaly LUNGS:  Clear to auscultation bilaterally HEART: Irregularly irregular.  PMI not displaced or sustained,S1 and S2 within normal limits, no S3, no S4, no clicks, no rubs, no murmurs ABD:  Flat, positive bowel sounds normal in frequency in pitch, no bruits, no rebound, no guarding, no midline pulsatile mass, no hepatomegaly, no splenomegaly EXT:  2 plus pulses throughout, no edema, no cyanosis no clubbing SKIN:  No rashes no nodules NEURO:  Cranial nerves II through XII grossly intact, motor grossly intact throughout PSYCH:  Cognitively intact, oriented to person place and time   Labs High Sensitivity Troponin:  No results for input(s): TROPONINIHS in the last 720 hours.   Chemistry Recent Labs  Lab 01/12/24 1430 01/16/24 0340 01/17/24 0258  NA 140 141 141  K 3.9 3.7 3.2*  CL 106 109 106  CO2 17* 18* 23  GLUCOSE 95 107* 98  BUN 20 31*  25*  CREATININE 1.57* 1.38* 1.42*  CALCIUM  9.6 9.6 9.0  PROT 7.5  --  7.0  ALBUMIN 4.1  --  3.7  AST 18  --  32  ALT 18  --  33  ALKPHOS 94  --  87  BILITOT 1.0  --  0.8  GFRNONAA  --  44* 42*  ANIONGAP  --  13 13    Lipids  Recent Labs  Lab 01/12/24 1433  CHOL 177  TRIG 88  HDL 47  LABVLDL 16  LDLCALC 114*  CHOLHDL 3.8    Hematology Recent Labs  Lab 01/16/24 0340 01/17/24 0258  WBC 7.5 5.2  RBC 4.77 4.68  HGB 13.0 12.7  HCT 40.5 39.9  MCV 84.9 85.3  MCH 27.3 27.1  MCHC 32.1 31.8  RDW 15.5 15.4  PLT 187 198   Thyroid   Recent Labs  Lab 01/16/24 0903  TSH 1.560    BNP Recent Labs  Lab 01/16/24 0340  PROBNP 5,200.0*    DDimer No results for input(s): DDIMER in the last 168 hours.   Radiology  ECHOCARDIOGRAM COMPLETE Result Date: 01/16/2024    ECHOCARDIOGRAM REPORT   Patient Name:   Morgan Bean Date  of Exam: 01/16/2024 Medical Rec #:  [id number]    Height:       65.5 in Accession #:    [id number]   Weight:       177.9 lb Date of Birth:  Mar 19, 1966    BSA:          1.893 m Patient Age:    58 years     BP:           158/111 mmHg Patient Gender: F            HR:           89 bpm. Exam Location:  Inpatient Procedure: 2D Echo, Color Doppler and Cardiac Doppler (Both Spectral and Color            Flow Doppler were utilized during procedure). Indications:    Afib I48.91  History:        Patient has prior history of Echocardiogram examinations, most                 recent 04/14/2021. CHF, CAD, TIA, Stroke and COPD,                 Arrythmias:Atrial Fibrillation and Atrial Flutter,                 Signs/Symptoms:Dyspnea and Fatigue; Risk Factors:Current Smoker                 and Sleep Apnea. H/O Mitral regurgitation, Pulmonary                 Hypertension, Polysubstance abuse.  Sonographer:    BERNARDA ROCKS Referring Phys: [id number] TAYE T GONFA IMPRESSIONS  1. Left ventricular ejection fraction, by estimation, is 50 to 55% with beat to beat variability. The left ventricle has low normal function. The left ventricle has no regional wall motion abnormalities. There is mild left ventricular hypertrophy. Left ventricular diastolic parameters are indeterminate. Elevated filling pressures, E/e' 17.  2. Right ventricular systolic function is mildly reduced. The right ventricular size is normal. There is moderately elevated pulmonary artery systolic pressure. The estimated right ventricular systolic pressure is 53.7 mmHg.  3. Left atrial size was severely dilated.  4. Right atrial size was severely dilated.  5. The mitral valve is degenerative.  Mild to moderate mitral valve regurgitation. No evidence of mitral stenosis.  6. The aortic valve is tricuspid. There is mild calcification of the aortic valve. Aortic valve regurgitation is mild to moderate. Aortic valve sclerosis/calcification is present, without any evidence of aortic  stenosis.  7. Aortic dilatation noted. There is borderline dilatation of the ascending aorta, measuring 40 mm.  8. The inferior vena cava is dilated in size with <50% respiratory variability, suggesting right atrial pressure of 15 mmHg. FINDINGS  Left Ventricle: Left ventricular ejection fraction, by estimation, is 50 to 55%. The left ventricle has low normal function. The left ventricle has no regional wall motion abnormalities. The left ventricular internal cavity size was normal in size. There is mild left ventricular hypertrophy. Left ventricular diastolic function could not be evaluated due to atrial fibrillation. Left ventricular diastolic parameters are indeterminate. Right Ventricle: The right ventricular size is normal. No increase in right ventricular wall thickness. Right ventricular systolic function is mildly reduced. There is moderately elevated pulmonary artery systolic pressure. The tricuspid regurgitant velocity is 3.11 m/s, and with an assumed right atrial pressure of 15 mmHg, the estimated right ventricular systolic pressure is 53.7 mmHg. Left Atrium: Left atrial size was severely dilated. Right Atrium: Right atrial size was severely dilated. Pericardium: There is no evidence of pericardial effusion. Mitral Valve: The mitral valve is degenerative in appearance. Mild mitral annular calcification. Mild to moderate mitral valve regurgitation. No evidence of mitral valve stenosis. MV peak gradient, 8.6 mmHg. The mean mitral valve gradient is 3.0 mmHg. Tricuspid Valve: The tricuspid valve is grossly normal. Tricuspid valve regurgitation is mild . No evidence of tricuspid stenosis. Aortic Valve: The aortic valve is tricuspid. There is mild calcification of the aortic valve. Aortic valve regurgitation is mild to moderate. Aortic regurgitation PHT measures 422 msec. Aortic valve sclerosis/calcification is present, without any evidence of aortic stenosis. Aortic valve mean gradient measures 4.0 mmHg.  Aortic valve peak gradient measures 8.3 mmHg. Aortic valve area, by VTI measures 1.96 cm. Pulmonic Valve: The pulmonic valve was normal in structure. Pulmonic valve regurgitation is trivial. No evidence of pulmonic stenosis. Aorta: Aortic dilatation noted and the aortic root is normal in size and structure. There is borderline dilatation of the ascending aorta, measuring 40 mm. Venous: The inferior vena cava is dilated in size with less than 50% respiratory variability, suggesting right atrial pressure of 15 mmHg. IAS/Shunts: No atrial level shunt detected by color flow Doppler.  LEFT VENTRICLE PLAX 2D LVIDd:         4.80 cm LVIDs:         3.40 cm LV PW:         1.10 cm LV IVS:        1.00 cm LVOT diam:     2.00 cm LV SV:         52 LV SV Index:   27 LVOT Area:     3.14 cm  LV Volumes (MOD) LV vol d, MOD A2C: 172.0 ml LV vol d, MOD A4C: 155.0 ml LV vol s, MOD A2C: 78.1 ml LV vol s, MOD A4C: 61.3 ml LV SV MOD A2C:     93.9 ml LV SV MOD A4C:     155.0 ml LV SV MOD BP:      105.7 ml RIGHT VENTRICLE            IVC RV Basal diam:  4.80 cm    IVC diam: 2.70 cm RV S prime:  9.64 cm/s TAPSE (M-mode): 2.0 cm RVSP:           53.7 mmHg LEFT ATRIUM              Index         RIGHT ATRIUM            Index LA diam:        4.30 cm  2.27 cm/m    RA Pressure: 15.00 mmHg LA Vol (A2C):   207.0 ml 109.36 ml/m  RA Area:     27.90 cm LA Vol (A4C):   115.0 ml 60.76 ml/m   RA Volume:   103.00 ml  54.42 ml/m LA Biplane Vol: 162.0 ml 85.59 ml/m  AORTIC VALVE                    PULMONIC VALVE AV Area (Vmax):    2.25 cm     PV Vmax:          0.91 m/s AV Area (Vmean):   2.02 cm     PV Peak grad:     3.3 mmHg AV Area (VTI):     1.96 cm     PR End Diast Vel: 6.15 msec AV Vmax:           144.00 cm/s AV Vmean:          91.300 cm/s AV VTI:            0.263 m AV Peak Grad:      8.3 mmHg AV Mean Grad:      4.0 mmHg LVOT Vmax:         103.00 cm/s LVOT Vmean:        58.700 cm/s LVOT VTI:          0.164 m LVOT/AV VTI ratio: 0.62 AI PHT:             422 msec  AORTA Ao Root diam: 3.60 cm Ao Asc diam:  4.00 cm MITRAL VALVE                  TRICUSPID VALVE MV Area (PHT): 4.08 cm       TR Peak grad:   38.7 mmHg MV Area VTI:   1.38 cm       TR Vmax:        311.00 cm/s MV Peak grad:  8.6 mmHg       Estimated RAP:  15.00 mmHg MV Mean grad:  3.0 mmHg       RVSP:           53.7 mmHg MV Vmax:       1.47 m/s MV Vmean:      75.7 cm/s      SHUNTS MV Decel Time: 186 msec       Systemic VTI:  0.16 m MR Peak grad:    146.2 mmHg   Systemic Diam: 2.00 cm MR Mean grad:    96.0 mmHg MR Vmax:         604.50 cm/s MR Vmean:        454.0 cm/s MR PISA:         5.09 cm MR PISA Eff ROA: 26 mm MR PISA Radius:  0.90 cm MV E velocity: 133.00 cm/s MV A velocity: 30.20 cm/s MV E/A ratio:  4.40 Soyla Merck MD Electronically signed by Soyla Merck MD Signature Date/Time: 01/16/2024/11:18:11 AM    Final    CT Angio Chest PE W and/or Wo Contrast  Result Date: 01/16/2024 EXAM: CTA of the Chest without and with contrast for PE 01/16/2024 05:32:01 AM TECHNIQUE: CTA of the chest was performed without and with the administration of intravenous contrast (75 mL iohexol  (OMNIPAQUE ) 350 MG/ML injection). Multiplanar reformatted images are provided for review. MIP images are provided for review. Automated exposure control, iterative reconstruction, and/or weight based adjustment of the mA/kV was utilized to reduce the radiation dose to as low as reasonably achievable. COMPARISON: CTA chest 12/25/2020. Chest radiographs earlier today. Non-contrast chest CT 05/04/2023. CLINICAL HISTORY: 58 year old female. Pulmonary embolism (PE) suspected, high prob. FINDINGS: PULMONARY ARTERIES: Pulmonary arteries are adequately opacified for evaluation. No pulmonary embolism. Main pulmonary artery is normal in caliber. MEDIASTINUM: Excellent pulmonary artery contrast timing. No left heart or aortic contrast. Chronic cardiomegaly. Reflux of right heart contrast into dilated hepatic veins and hepatic  IVC. No pericardial effusion. Calcified thoracic aortic atherosclerosis. Tortuous great vessels. There is no acute abnormality of the thoracic aorta. LYMPH NODES: No mediastinal, hilar or axillary lymphadenopathy. LUNGS AND PLEURA: Lower lung volumes compared to 05/04/2023. Major airways remain patent. Small layering right pleural effusion. Mild generalized increased pulmonary ground glass opacity, except for gas trapping in the medial basal segment of the left lower lobe. Bilateral lung base predominant septal thickening, streaky infrahilar opacity, trace fluid or thickening along the right minor fissure. No convincing consolidation. Constellation favors acute pulmonary edema with small volume pleural effusions. No pneumothorax. UPPER ABDOMEN: Other visible upper abdominal non-contrast viscera remarkable for renal vascular calcifications and right renal atrophy. SOFT TISSUES AND BONES: No acute bone or soft tissue abnormality. IMPRESSION: 1. No acute pulmonary embolism identified. 2. Findings favor a degree of right heart dysfunction with acute pulmonary edema and small volume pleural fluid. Chronic cardiomegaly. Electronically signed by: Helayne Hurst MD 01/16/2024 06:00 AM EDT RP Workstation: [id number]   DG Chest 2 View Result Date: 01/16/2024 EXAM: 2 VIEW(S) XRAY OF THE CHEST 01/16/2024 03:47:00 AM COMPARISON: CT chest dated 05/04/2023. CLINICAL HISTORY: Cough, SOB FINDINGS: LUNGS AND PLEURA: Multifocal patchy opacities, right mid and lower lung predominant, favoring multifocal pneumonia over interstitial edema. No pleural effusion. No pneumothorax. HEART AND MEDIASTINUM: Mild cardiomegaly. Thoracic aortic atherosclerosis. BONES AND SOFT TISSUES: No acute osseous abnormality. IMPRESSION: 1. Multifocal patchy opacities, right mid/lower lung predominant, favoring multifocal pneumonia over interstitial edema. 2. Mild cardiomegaly. Electronically signed by: Pinkie Pebbles MD 01/16/2024 03:52 AM EDT RP  Workstation: [id number]    Cardiac Studies  Echo 01/16/24:   1. Left ventricular ejection fraction, by estimation, is 50 to 55% with  beat to beat variability. The left ventricle has low normal function. The  left ventricle has no regional wall motion abnormalities. There is mild  left ventricular hypertrophy. Left  ventricular diastolic parameters are indeterminate. Elevated filling  pressures, E/e' 17.   2. Right ventricular systolic function is mildly reduced. The right  ventricular size is normal. There is moderately elevated pulmonary artery  systolic pressure. The estimated right ventricular systolic pressure is  53.7 mmHg.   3. Left atrial size was severely dilated.   4. Right atrial size was severely dilated.   5. The mitral valve is degenerative. Mild to moderate mitral valve  regurgitation. No evidence of mitral stenosis.   6. The aortic valve is tricuspid. There is mild calcification of the  aortic valve. Aortic valve regurgitation is mild to moderate. Aortic valve  sclerosis/calcification is present, without any evidence of aortic  stenosis.   7. Aortic dilatation noted. There is borderline dilatation  of the  ascending aorta, measuring 40 mm.   8. The inferior vena cava is dilated in size with <50% respiratory  variability, suggesting right atrial pressure of 15 mmHg.   Patient Profile   58 y.o. female Ms. Absher is a 46F with atrial fibirllation/flutter s/p ablation, COPD, OSA not on CPAP, CKD 3a, CVA, hypertension, and polysubstance abuse (cocaine, tobacco) admitted with CAP and atrial fibrillation with RVR.    Assessment & Plan  # Acute on chronic HFpEF:  # Moderately elevated pulmonary pressure: Continue diuresis with lasix .  Increased to 60mg  IV bid.  Not much progress thus far, but she is feeling better.  Plan for RHC when euvolemic.  She hasn't been adherent to CPAP.  Notes that her machine is in [place] and she no longer has it.  Needs outpatient sleep study.   Encourage her to abstain from illicit substances.  Recommend SGLT2i.   # PAF:  S/p ablation.  Rate now controlled on diltiazem .  Transitioning from IV to oral.  Avoid beta blocker given ongoing cocaine use.  Continue Xarelto .    # Hyperlipidemia:  Continue atorvastatin .  # Hypertension: Continue lisinopril  and spironolactone .       For questions or updates, please contact [facility] Please consult [email or web address] for contact info under       Signed, Annabella Scarce, MD  01/17/2024, 1:48 PM

## 2024-01-17 NOTE — Evaluation (Addendum)
 Clinical/Bedside Swallow Evaluation Patient Details  Name: Morgan Bean MRN: [id number] Date of Birth: 05/06/1965  Today's Date: 01/17/2024 Time: SLP Start Time (ACUTE ONLY): 0848 SLP Stop Time (ACUTE ONLY): 0903 SLP Time Calculation (min) (ACUTE ONLY): 15 min  Past Medical History:  Past Medical History:  Diagnosis Date   Anemia of other chronic disease 11/13/2013   Anginal pain    none in past year   Anxiety    Asthma    patient denies   Autoimmune disease 12/27/2018   Candidiasis 06/23/2013   Chronic diastolic CHF (congestive heart failure) ([name])    Constipation    Coronary artery disease    Lexiscan  myoview  (10/12) with significant ST depression upon Lexiscan  injection but no ischemia or infarction on perfusion images. Left heart cath (10/12): 30% mid LAD, 30% ostial D1, 50% ostial D2.     Cough 11/13/2013   Depression    Headache 02/22/2014   Hx of cardiovascular stress test    Lexiscan  Myoview  (2/16): Breast attenuation artifact, no ischemia, EF 64%; low risk   Hyperlipemia    Hypertension    Iron deficiency    hx of   Leukocytoclastic vasculitis ([name])    Rash across lower body, occurred in 9/11, diagnosed by skin biopsy. ANA positive. Thought to be secondary to cocaine use.   Leukopenia 03/21/2013   Lymphadenopathy 03/21/2013   Lymphadenopathy 01/30/2014   Mental disorder    Pancytopenia ([name])    Pancytopenia, acquired ([name]) 07/16/2015   Polysubstance abuse ([name])    Prior cocaine   Pulmonary HTN ([name])    Echo (9/11) with EF 65%, mild LVH, mild AI, mild MR, moderate to possibly severe TR with PA systolic pressure 52 mmHg. Echo (10/12): severe LV hypertrophy, EF 60-65%, mild MR, mild AI, moderate to severe tricuspid regurgitation, PA systolic pressure 38 mmHg.  Echo 4/14: moderate LVH, EF 65%, normal wall motion, diastolic dysfunction, mild AI, mild MR   Shortness of breath    a. PFTs 5/14:  FEV1/FVC 99% predicted; FVC 60% predicted; DLCO mildly reduced, mild  restriction, air trapping.;  b.  seen by pulmo (Dr. Corrie) 5/14: mainly upper airway symptoms - ACE d/c'd (sx's better)   Stroke [facility]) 2010ish   Syphilis 04/25/2013   Tobacco abuse    Tricuspid regurgitation    Unspecified deficiency anemia 03/29/2013   Past Surgical History:  Past Surgical History:  Procedure Laterality Date   ANKLE SURGERY     right   BONE MARROW ASPIRATION     CARDIAC CATHETERIZATION     CARDIAC CATHETERIZATION N/A 04/23/2015   Procedure: Right Heart Cath;  Surgeon: Ezra GORMAN Shuck, MD;  Location: [facility] INVASIVE CV LAB;  Service: Cardiovascular;  Laterality: N/A;   CARDIOVERSION N/A 03/11/2018   Procedure: CARDIOVERSION;  Surgeon: Okey Vina GAILS, MD;  Location: [facility] ENDOSCOPY;  Service: Cardiovascular;  Laterality: N/A;   I & D EXTREMITY  08/09/2011   Procedure: IRRIGATION AND DEBRIDEMENT EXTREMITY;  Surgeon: Deward GORMAN Curvin DOUGLAS, MD;  Location: [facility] OR;  Service: General;  Laterality: Left;  i & D Left axilla abscess   LYMPH NODE BIOPSY N/A 04/05/2013   Procedure: EXCISIONAL BIOPSY RIGHT SUBMANDIBULAR NODE, NASAL ENDOSCOPIC WITH BIOPSY NASAL PHARYNX;  Surgeon: Marlyce Finer, MD;  Location: [facility] OR;  Service: ENT;  Laterality: N/A;   MULTIPLE EXTRACTIONS WITH ALVEOLOPLASTY N/A 03/03/2019   Procedure: MULTIPLE EXTRACTION WITH ALVEOLOPLASTY;  Surgeon: Sheryle Hamilton, DDS;  Location: [facility] OR;  Service: Oral Surgery;  Laterality: N/A;   TEE WITHOUT CARDIOVERSION  N/A 03/11/2018   Procedure: TRANSESOPHAGEAL ECHOCARDIOGRAM (TEE);  Surgeon: Okey Vina GAILS, MD;  Location: [facility] ENDOSCOPY;  Service: Cardiovascular;  Laterality: N/A;   TEE WITHOUT CARDIOVERSION N/A 12/27/2020   Procedure: TRANSESOPHAGEAL ECHOCARDIOGRAM (TEE);  Surgeon: Tobb, Kardie, DO;  Location: [facility] ENDOSCOPY;  Service: Cardiovascular;  Laterality: N/A;   HPI:  Patient is a 58 y.o. 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 Patient reports progressive shortness of breath for about a month. Her breathing gotten worse and she presented to ED on 10/26. Also reports productive cough with yellowish phlegm. Patient previously had modified barium study done on 12/26/2020 and presented with an oropharyngeal dysphagia. A bedside swallow evaluation was ordered to assess patient's swallow.    Assessment / Plan / Recommendation  Clinical Impression  Patient is not currently presenting with clinical s/s of dysphagia as per this BSE. She was awake, alert, and participated fully in this evaluation. She was edentulous but indicated her top and bottom dentures were at home. SLP assessed patient's swallow via PO's of thin liquids and purees. Swallow initiation was timely with straw sips of thin liquids and bites of puree. Patient had an immediate cough following her first sip of water but did not cough anymore throughout the session. Patient reported she often coughs when taking larger sips, but small sips help this. Patient had an MBS in 2022 which revealed oropharyngeal dysphagia characterized by reduced tongue base retraction, impaired bolus cohesion, reduced laryngeal elevation, reduced anterior laryngeal movement, and a pharyngeal delay. Due to patient's prior dysphagia history, patient's issues do not appear to be acute. Not recommending treatment, SLP is signing off at this time. SLP Visit Diagnosis: Dysphagia, unspecified (R13.10)    Aspiration Risk       Diet Recommendation Regular;Thin liquid    Liquid Administration via: Spoon;Cup;Straw Medication Administration: Other (Comment) (as tolerated) Supervision: Patient able to self feed Compensations: Small sips/bites;Slow rate Postural Changes: Seated upright at 90 degrees;Remain upright for at least 30 minutes after po intake    Other  Recommendations Oral Care Recommendations: Oral care BID     Assistance Recommended at Discharge    Functional Status  Assessment Patient has had a recent decline in their functional status and demonstrates the ability to make significant improvements in function in a reasonable and predictable amount of time.  Frequency and Duration            Prognosis        Swallow Study   General Date of Onset: 01/17/24 HPI: Patient is a 58 y.o. 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 Patient reports progressive shortness of breath for about a month. Her breathing gotten worse and she presented to ED on 10/26. Also reports productive cough with yellowish phlegm. Patient previously had modified barium study done on 12/26/2020 and presented with an oropharyngeal dysphagia. A bedside swallow evaluation was ordered to assess patient's swallow. Type of Study: Bedside Swallow Evaluation Previous Swallow Assessment: MBS on 12/26/2020 Diet Prior to this Study: Regular;Thin liquids (Level 0) (On heart diet\) Temperature Spikes Noted: No Respiratory Status: Room air History of Recent Intubation: No Behavior/Cognition: Alert;Cooperative;Pleasant mood Oral Cavity Assessment: Within Functional Limits Oral Care Completed by SLP: No Oral Cavity - Dentition: Dentures, top;Dentures, bottom;Dentures, not available Vision: Functional for self-feeding Self-Feeding Abilities: Able  to feed self Patient Positioning: Upright in bed Volitional Cough: Strong    Oral/Motor/Sensory Function Overall Oral Motor/Sensory Function: Within functional limits   Ice Chips     Thin Liquid Thin Liquid: Impaired Presentation: Self Fed;Straw Pharyngeal  Phase Impairments: Cough - Immediate    Nectar Thick     Honey Thick     Puree Puree: Within functional limits Presentation: Self Fed   Solid           Damien Hy  Graduate SLP Clinican

## 2024-01-17 NOTE — Progress Notes (Signed)
 [facility] PROGRESS NOTE    Progress Note  Morgan Bean  FMW:[id number] DOB: Jan 21, 1966 DOA: 01/16/2024 PCP: Delbert Clam, MD     Brief Narrative:   Morgan Bean is an 58 y.o. female past medical history of a flutter A-fib and a left atrial thrombus on Xarelto , chronic kidney disease stage IIIa, history of CVA polysubstance abuse including cocaine and tobacco comes in with shortness of breath, productive cough and yellow sputum in the ED was found to be hypertensive and A-fib with RVR   Assessment/Plan:   Persistent atrial fibrillation with rapid ventricular response ([name]) On IV Cardizem  infusion, suspect noncompliance in the setting of substance use. UDS was positive for cocaine. Her home dose of Xarelto  was resumed. 2D echo EF of 55% no regional wall motion abnormality with diastolic dysfunction. Rate controlled on IV diltiazem  transition to oral.  Acute on chronic HFpEF with RV failure: JVD still prominent, chest x-ray showed pulmonary edema with elevated BNP continue IV Lasix . Continue strict I's and O's and daily weights. Monitor electrolytes and replete as needed. Her 2D echo showed preserved EF but does show RV dilation. We started her on low-dose lisinopril .  Uncontrolled hypertension: Still elevated continue oral diltiazem , IV Lasix  low-dose lisinopril  and Aldactone . Check a basic metabolic panel tomorrow. Blood pressure is uncontrolled.  Chronic kidney disease stage IIIa: Her creatinine appears to be at baseline continue to monitor as we are diuresing her.  COPD: Continue inhalers satting well on room air.  History of CVA: Continue Lipitor .  Polysubstance abuse: She has been counseled.  Noncompliance: She has been counseled.   DVT prophylaxis: Xarelto  Family Communication:none Status is: Observation The patient will require care spanning > 2 midnights and should be moved to inpatient because: Cardiac telemetry    Code Status:     Code  Status Orders  (From admission, onward)           Start     Ordered   01/16/24 0823  Full code  Continuous       Question:  By:  Answer:  Consent: discussion documented in EHR   01/16/24 0822           Code Status History     Date Active Date Inactive Code Status Order ID Comments User Context   04/14/2021 0212 04/17/2021 1956 Full Code [id number]  Lonzell Emeline HERO, DO ED   12/25/2020 0732 12/28/2020 2142 Full Code [id number]  Claudene Maximino LABOR, MD ED   05/28/2020 0924 05/29/2020 2146 Full Code [id number]  Arminda Daved HERO, MD ED   12/16/2018 2317 12/22/2018 2304 Full Code [id number]  Kerman Soulier, MD ED   04/25/2018 1714 04/26/2018 2003 Full Code [id number]  Braden Lash, MD Inpatient   04/25/2018 1714 04/25/2018 1714 Full Code [id number]  Braden Lash, MD Inpatient   03/08/2018 2233 03/14/2018 2107 Full Code [id number]  Charlton Evalene RAMAN, MD ED   04/23/2015 1116 04/23/2015 2040 Full Code [id number]  Rolan Ezra RAMAN, MD Inpatient   06/01/2014 1703 06/03/2014 1512 Full Code [id number]  Rama, Tawni SQUIBB, MD Inpatient   07/04/2013 0052 07/06/2013 2102 Full Code [id number]  Alm Maxwell LABOR, MD Inpatient   06/07/2013 1247 06/09/2013 1255 Full Code [id number]  Philip Cornet, MD Inpatient   06/03/2013 0036 06/07/2013 1247 Full Code [id number]  Jeannett Liming, DO Inpatient   04/05/2013 1229 04/05/2013 1717 Full Code [id number]  Arlana Arnt, MD Inpatient   11/12/2011 1937 11/13/2011 1949 Full Code [id number]  Carita Senior, MD ED  IV Access:   Peripheral IV   Procedures and diagnostic studies:   ECHOCARDIOGRAM COMPLETE Result Date: 01/16/2024    ECHOCARDIOGRAM REPORT   Patient Name:   Morgan Bean Date of Exam: 01/16/2024 Medical Rec #:  [id number]    Height:       65.5 in Accession #:    [id number]   Weight:       177.9 lb Date of Birth:  1965-10-01    BSA:          1.893 m Patient Age:    58 years     BP:           158/111 mmHg Patient Gender: F            HR:           89 bpm. Exam Location:  Inpatient  Procedure: 2D Echo, Color Doppler and Cardiac Doppler (Both Spectral and Color            Flow Doppler were utilized during procedure). Indications:    Afib I48.91  History:        Patient has prior history of Echocardiogram examinations, most                 recent 04/14/2021. CHF, CAD, TIA, Stroke and COPD,                 Arrythmias:Atrial Fibrillation and Atrial Flutter,                 Signs/Symptoms:Dyspnea and Fatigue; Risk Factors:Current Smoker                 and Sleep Apnea. H/O Mitral regurgitation, Pulmonary                 Hypertension, Polysubstance abuse.  Sonographer:    BERNARDA ROCKS Referring Phys: [id number] TAYE T GONFA IMPRESSIONS  1. Left ventricular ejection fraction, by estimation, is 50 to 55% with beat to beat variability. The left ventricle has low normal function. The left ventricle has no regional wall motion abnormalities. There is mild left ventricular hypertrophy. Left ventricular diastolic parameters are indeterminate. Elevated filling pressures, E/e' 17.  2. Right ventricular systolic function is mildly reduced. The right ventricular size is normal. There is moderately elevated pulmonary artery systolic pressure. The estimated right ventricular systolic pressure is 53.7 mmHg.  3. Left atrial size was severely dilated.  4. Right atrial size was severely dilated.  5. The mitral valve is degenerative. Mild to moderate mitral valve regurgitation. No evidence of mitral stenosis.  6. The aortic valve is tricuspid. There is mild calcification of the aortic valve. Aortic valve regurgitation is mild to moderate. Aortic valve sclerosis/calcification is present, without any evidence of aortic stenosis.  7. Aortic dilatation noted. There is borderline dilatation of the ascending aorta, measuring 40 mm.  8. The inferior vena cava is dilated in size with <50% respiratory variability, suggesting right atrial pressure of 15 mmHg. FINDINGS  Left Ventricle: Left ventricular ejection fraction, by  estimation, is 50 to 55%. The left ventricle has low normal function. The left ventricle has no regional wall motion abnormalities. The left ventricular internal cavity size was normal in size. There is mild left ventricular hypertrophy. Left ventricular diastolic function could not be evaluated due to atrial fibrillation. Left ventricular diastolic parameters are indeterminate. Right Ventricle: The right ventricular size is normal. No increase in right ventricular wall thickness. Right ventricular systolic function is mildly reduced. There is moderately elevated pulmonary artery  systolic pressure. The tricuspid regurgitant velocity is 3.11 m/s, and with an assumed right atrial pressure of 15 mmHg, the estimated right ventricular systolic pressure is 53.7 mmHg. Left Atrium: Left atrial size was severely dilated. Right Atrium: Right atrial size was severely dilated. Pericardium: There is no evidence of pericardial effusion. Mitral Valve: The mitral valve is degenerative in appearance. Mild mitral annular calcification. Mild to moderate mitral valve regurgitation. No evidence of mitral valve stenosis. MV peak gradient, 8.6 mmHg. The mean mitral valve gradient is 3.0 mmHg. Tricuspid Valve: The tricuspid valve is grossly normal. Tricuspid valve regurgitation is mild . No evidence of tricuspid stenosis. Aortic Valve: The aortic valve is tricuspid. There is mild calcification of the aortic valve. Aortic valve regurgitation is mild to moderate. Aortic regurgitation PHT measures 422 msec. Aortic valve sclerosis/calcification is present, without any evidence of aortic stenosis. Aortic valve mean gradient measures 4.0 mmHg. Aortic valve peak gradient measures 8.3 mmHg. Aortic valve area, by VTI measures 1.96 cm. Pulmonic Valve: The pulmonic valve was normal in structure. Pulmonic valve regurgitation is trivial. No evidence of pulmonic stenosis. Aorta: Aortic dilatation noted and the aortic root is normal in size and  structure. There is borderline dilatation of the ascending aorta, measuring 40 mm. Venous: The inferior vena cava is dilated in size with less than 50% respiratory variability, suggesting right atrial pressure of 15 mmHg. IAS/Shunts: No atrial level shunt detected by color flow Doppler.  LEFT VENTRICLE PLAX 2D LVIDd:         4.80 cm LVIDs:         3.40 cm LV PW:         1.10 cm LV IVS:        1.00 cm LVOT diam:     2.00 cm LV SV:         52 LV SV Index:   27 LVOT Area:     3.14 cm  LV Volumes (MOD) LV vol d, MOD A2C: 172.0 ml LV vol d, MOD A4C: 155.0 ml LV vol s, MOD A2C: 78.1 ml LV vol s, MOD A4C: 61.3 ml LV SV MOD A2C:     93.9 ml LV SV MOD A4C:     155.0 ml LV SV MOD BP:      105.7 ml RIGHT VENTRICLE            IVC RV Basal diam:  4.80 cm    IVC diam: 2.70 cm RV S prime:     9.64 cm/s TAPSE (M-mode): 2.0 cm RVSP:           53.7 mmHg LEFT ATRIUM              Index         RIGHT ATRIUM            Index LA diam:        4.30 cm  2.27 cm/m    RA Pressure: 15.00 mmHg LA Vol (A2C):   207.0 ml 109.36 ml/m  RA Area:     27.90 cm LA Vol (A4C):   115.0 ml 60.76 ml/m   RA Volume:   103.00 ml  54.42 ml/m LA Biplane Vol: 162.0 ml 85.59 ml/m  AORTIC VALVE                    PULMONIC VALVE AV Area (Vmax):    2.25 cm     PV Vmax:          0.91 m/s AV  Area (Vmean):   2.02 cm     PV Peak grad:     3.3 mmHg AV Area (VTI):     1.96 cm     PR End Diast Vel: 6.15 msec AV Vmax:           144.00 cm/s AV Vmean:          91.300 cm/s AV VTI:            0.263 m AV Peak Grad:      8.3 mmHg AV Mean Grad:      4.0 mmHg LVOT Vmax:         103.00 cm/s LVOT Vmean:        58.700 cm/s LVOT VTI:          0.164 m LVOT/AV VTI ratio: 0.62 AI PHT:            422 msec  AORTA Ao Root diam: 3.60 cm Ao Asc diam:  4.00 cm MITRAL VALVE                  TRICUSPID VALVE MV Area (PHT): 4.08 cm       TR Peak grad:   38.7 mmHg MV Area VTI:   1.38 cm       TR Vmax:        311.00 cm/s MV Peak grad:  8.6 mmHg       Estimated RAP:  15.00 mmHg MV Mean grad:   3.0 mmHg       RVSP:           53.7 mmHg MV Vmax:       1.47 m/s MV Vmean:      75.7 cm/s      SHUNTS MV Decel Time: 186 msec       Systemic VTI:  0.16 m MR Peak grad:    146.2 mmHg   Systemic Diam: 2.00 cm MR Mean grad:    96.0 mmHg MR Vmax:         604.50 cm/s MR Vmean:        454.0 cm/s MR PISA:         5.09 cm MR PISA Eff ROA: 26 mm MR PISA Radius:  0.90 cm MV E velocity: 133.00 cm/s MV A velocity: 30.20 cm/s MV E/A ratio:  4.40 Soyla Merck MD Electronically signed by Soyla Merck MD Signature Date/Time: 01/16/2024/11:18:11 AM    Final    CT Angio Chest PE W and/or Wo Contrast Result Date: 01/16/2024 EXAM: CTA of the Chest without and with contrast for PE 01/16/2024 05:32:01 AM TECHNIQUE: CTA of the chest was performed without and with the administration of intravenous contrast (75 mL iohexol  (OMNIPAQUE ) 350 MG/ML injection). Multiplanar reformatted images are provided for review. MIP images are provided for review. Automated exposure control, iterative reconstruction, and/or weight based adjustment of the mA/kV was utilized to reduce the radiation dose to as low as reasonably achievable. COMPARISON: CTA chest 12/25/2020. Chest radiographs earlier today. Non-contrast chest CT 05/04/2023. CLINICAL HISTORY: 58 year old female. Pulmonary embolism (PE) suspected, high prob. FINDINGS: PULMONARY ARTERIES: Pulmonary arteries are adequately opacified for evaluation. No pulmonary embolism. Main pulmonary artery is normal in caliber. MEDIASTINUM: Excellent pulmonary artery contrast timing. No left heart or aortic contrast. Chronic cardiomegaly. Reflux of right heart contrast into dilated hepatic veins and hepatic IVC. No pericardial effusion. Calcified thoracic aortic atherosclerosis. Tortuous great vessels. There is no acute abnormality of the thoracic aorta. LYMPH NODES: No mediastinal, hilar or axillary  lymphadenopathy. LUNGS AND PLEURA: Lower lung volumes compared to 05/04/2023. Major airways remain  patent. Small layering right pleural effusion. Mild generalized increased pulmonary ground glass opacity, except for gas trapping in the medial basal segment of the left lower lobe. Bilateral lung base predominant septal thickening, streaky infrahilar opacity, trace fluid or thickening along the right minor fissure. No convincing consolidation. Constellation favors acute pulmonary edema with small volume pleural effusions. No pneumothorax. UPPER ABDOMEN: Other visible upper abdominal non-contrast viscera remarkable for renal vascular calcifications and right renal atrophy. SOFT TISSUES AND BONES: No acute bone or soft tissue abnormality. IMPRESSION: 1. No acute pulmonary embolism identified. 2. Findings favor a degree of right heart dysfunction with acute pulmonary edema and small volume pleural fluid. Chronic cardiomegaly. Electronically signed by: Helayne Hurst MD 01/16/2024 06:00 AM EDT RP Workstation: [id number]   DG Chest 2 View Result Date: 01/16/2024 EXAM: 2 VIEW(S) XRAY OF THE CHEST 01/16/2024 03:47:00 AM COMPARISON: CT chest dated 05/04/2023. CLINICAL HISTORY: Cough, SOB FINDINGS: LUNGS AND PLEURA: Multifocal patchy opacities, right mid and lower lung predominant, favoring multifocal pneumonia over interstitial edema. No pleural effusion. No pneumothorax. HEART AND MEDIASTINUM: Mild cardiomegaly. Thoracic aortic atherosclerosis. BONES AND SOFT TISSUES: No acute osseous abnormality. IMPRESSION: 1. Multifocal patchy opacities, right mid/lower lung predominant, favoring multifocal pneumonia over interstitial edema. 2. Mild cardiomegaly. Electronically signed by: Pinkie Pebbles MD 01/16/2024 03:52 AM EDT RP Workstation: [id number]     Medical Consultants:   None.   Subjective:    Morgan Bean relates her breathing is better.   Objective:    Vitals:   01/16/24 1900 01/16/24 1936 01/16/24 2006 01/17/24 0500  BP: (!) 141/104     Pulse: 91     Resp: (!) 30     Temp:  97.8 F (36.6 C)     TempSrc:  Oral    SpO2: 94%  95%   Weight:    79.6 kg  Height:       SpO2: 95 %   Intake/Output Summary (Last 24 hours) at 01/17/2024 0614 Last data filed at 01/16/2024 2019 Gross per 24 hour  Intake 803.62 ml  Output 850 ml  Net -46.38 ml   Filed Weights   01/16/24 0907 01/17/24 0500  Weight: 79.7 kg 79.6 kg    Exam: General exam: In no acute distress. Respiratory system: Good air movement and has crackles at the bases Cardiovascular system: S1 & S2 heard, RRR.  Positive JVD Gastrointestinal system: Abdomen is nondistended, soft and nontender.  Extremities: No pedal edema. Skin: No rashes, lesions or ulcers Psychiatry: Judgement and insight appear normal. Mood & affect appropriate.    Data Reviewed:    Labs: Basic Metabolic Panel: Recent Labs  Lab 01/12/24 1430 01/16/24 0340 01/17/24 0258  NA 140 141 141  K 3.9 3.7 3.2*  CL 106 109 106  CO2 17* 18* 23  GLUCOSE 95 107* 98  BUN 20 31* 25*  CREATININE 1.57* 1.38* 1.42*  CALCIUM  9.6 9.6 9.0   GFR Estimated Creatinine Clearance: 44.8 mL/min (A) (by C-G formula based on SCr of 1.42 mg/dL (H)). Liver Function Tests: Recent Labs  Lab 01/12/24 1430 01/17/24 0258  AST 18 32  ALT 18 33  ALKPHOS 94 87  BILITOT 1.0 0.8  PROT 7.5 7.0  ALBUMIN 4.1 3.7   No results for input(s): LIPASE, AMYLASE in the last 168 hours. No results for input(s): AMMONIA in the last 168 hours. Coagulation profile No results for input(s): INR, PROTIME in the last 168 hours.  COVID-19 Labs  No results for input(s): DDIMER, FERRITIN, LDH, CRP in the last 72 hours.  Lab Results  Component Value Date   SARSCOV2NAA POSITIVE (A) 04/13/2021   SARSCOV2NAA NEGATIVE 12/25/2020   SARSCOV2NAA NEGATIVE 05/28/2020   SARSCOV2NAA NEGATIVE 03/01/2019    CBC: Recent Labs  Lab 01/16/24 0340 01/17/24 0258  WBC 7.5 5.2  HGB 13.0 12.7  HCT 40.5 39.9  MCV 84.9 85.3  PLT 187 198   Cardiac Enzymes: No results for  input(s): CKTOTAL, CKMB, CKMBINDEX, TROPONINI in the last 168 hours. BNP (last 3 results) Recent Labs    01/16/24 0340  PROBNP 5,200.0*   CBG: No results for input(s): GLUCAP in the last 168 hours. D-Dimer: No results for input(s): DDIMER in the last 72 hours. Hgb A1c: No results for input(s): HGBA1C in the last 72 hours. Lipid Profile: No results for input(s): CHOL, HDL, LDLCALC, TRIG, CHOLHDL, LDLDIRECT in the last 72 hours. Thyroid  function studies: Recent Labs    01/16/24 0903  TSH 1.560   Anemia work up: No results for input(s): VITAMINB12, FOLATE, FERRITIN, TIBC, IRON, RETICCTPCT in the last 72 hours. Sepsis Labs: Recent Labs  Lab 01/16/24 0340 01/16/24 0903 01/17/24 0258  PROCALCITON  --  <0.10  --   WBC 7.5  --  5.2   Microbiology Recent Results (from the past 240 hours)  MRSA Next Gen by PCR, Nasal     Status: None   Collection Time: 01/16/24  9:32 AM   Specimen: Nasal Mucosa; Nasal Swab  Result Value Ref Range Status   MRSA by PCR Next Gen NOT DETECTED NOT DETECTED Final    Comment: (NOTE) The GeneXpert MRSA Assay (FDA approved for NASAL specimens only), is one component of a comprehensive MRSA colonization surveillance program. It is not intended to diagnose MRSA infection nor to guide or monitor treatment for MRSA infections. Test performance is not FDA approved in patients less than 69 years old. Performed at [facility], [address] [place], [place] [address]      Medications:    atorvastatin   80 mg Oral Daily   budesonide -glycopyrrolate-formoterol   2 puff Inhalation BID   Chlorhexidine  Gluconate Cloth  6 each Topical Daily   DULoxetine   60 mg Oral Daily   furosemide   60 mg Intravenous BID   pantoprazole   40 mg Oral Daily   Rivaroxaban   15 mg Oral Q supper   spironolactone   25 mg Oral Daily   Continuous Infusions:  diltiazem  (CARDIZEM ) infusion 15 mg/hr (01/17/24 0409)       LOS: 0 days   Erle Odell Castor  [facility]  01/17/2024, 6:14 AM

## 2024-01-17 NOTE — Plan of Care (Signed)

## 2024-01-17 NOTE — Telephone Encounter (Signed)
 Pharmacy Patient Advocate Encounter   Received notification from Pt Calls Messages that prior authorization for Trelegy Ellipta  100-62.5-25MCG/ACT aerosol powder  is required/requested.   Insurance verification completed.   The patient is insured through [place] [employer]ICA[employer].   Per test claim:  see below is preferred by the insurance.  If suggested medication is appropriate, Please send in a new RX and discontinue this one. If not, please advise as to why it's not appropriate so that we may request a Prior Authorization. Please note, some preferred medications may still require a PA.  If the suggested medications have not been trialed and there are no contraindications to their use, the PA will not be submitted, as it will not be approved.

## 2024-01-18 ENCOUNTER — Other Ambulatory Visit (HOSPITAL_COMMUNITY): Payer: Self-pay

## 2024-01-18 DIAGNOSIS — J9601 Acute respiratory failure with hypoxia: Secondary | ICD-10-CM | POA: Diagnosis not present

## 2024-01-18 DIAGNOSIS — I5033 Acute on chronic diastolic (congestive) heart failure: Secondary | ICD-10-CM | POA: Diagnosis not present

## 2024-01-18 DIAGNOSIS — I480 Paroxysmal atrial fibrillation: Secondary | ICD-10-CM | POA: Diagnosis not present

## 2024-01-18 LAB — BASIC METABOLIC PANEL WITH GFR
Anion gap: 12 (ref 5–15)
BUN: 24 mg/dL — ABNORMAL HIGH (ref 6–20)
CO2: 24 mmol/L (ref 22–32)
Calcium: 9.8 mg/dL (ref 8.9–10.3)
Chloride: 103 mmol/L (ref 98–111)
Creatinine, Ser: 1.5 mg/dL — ABNORMAL HIGH (ref 0.44–1.00)
GFR, Estimated: 39 mL/min — ABNORMAL LOW (ref >60)
Glucose, Bld: 90 mg/dL (ref 70–99)
Potassium: 3.4 mmol/L — ABNORMAL LOW (ref 3.5–5.1)
Sodium: 139 mmol/L (ref 135–145)

## 2024-01-18 MED ORDER — POTASSIUM CHLORIDE CRYS ER 20 MEQ PO TBCR
40.0000 meq | EXTENDED_RELEASE_TABLET | Freq: Two times a day (BID) | ORAL | Status: AC
  Administered 2024-01-18 (×2): 40 meq via ORAL
  Filled 2024-01-18 (×2): qty 2

## 2024-01-18 NOTE — Telephone Encounter (Signed)
Attempted to contact patient and VM is currently full.

## 2024-01-18 NOTE — Progress Notes (Signed)
 Progress Note  Patient Name: Morgan Bean Date of Encounter: 01/18/2024  Primary Cardiologist:   Annabella Scarce, MD   Subjective   She says that she is breathing better and that she has not felt this way in a long time.   Inpatient Medications    Scheduled Meds:  atorvastatin   80 mg Oral Daily   budesonide -glycopyrrolate-formoterol   2 puff Inhalation BID   diltiazem   360 mg Oral Daily   DULoxetine   60 mg Oral Daily   furosemide   60 mg Intravenous BID   lisinopril   20 mg Oral Daily   pantoprazole   40 mg Oral Daily   potassium chloride   40 mEq Oral BID   Rivaroxaban   15 mg Oral Q supper   sodium bicarbonate   650 mg Oral BID   spironolactone   25 mg Oral Daily   Continuous Infusions:  diltiazem  (CARDIZEM ) infusion Stopped (01/17/24 1229)   PRN Meds: acetaminophen  **OR** acetaminophen , fluticasone , hydrALAZINE , hydrOXYzine , mouth rinse   Vital Signs    Vitals:   01/18/24 0500 01/18/24 0822 01/18/24 1102 01/18/24 1253  BP:   132/86 (!) 145/124  Pulse:   73 89  Resp:   11 20  Temp:   98.4 F (36.9 C) 99.2 F (37.3 C)  TempSrc:   Oral Oral  SpO2:  99% 100% 98%  Weight: 76.2 kg     Height:        Intake/Output Summary (Last 24 hours) at 01/18/2024 1403 Last data filed at 01/18/2024 1158 Gross per 24 hour  Intake 414.6 ml  Output 150 ml  Net 264.6 ml   Filed Weights   01/16/24 0907 01/17/24 0500 01/18/24 0500  Weight: 79.7 kg 79.6 kg 76.2 kg    Telemetry    Atrial fib with controlled rate - Personally Reviewed  ECG    NA - Personally Reviewed  Physical Exam   GEN: No acute distress.   Neck: No  JVD Cardiac: Irregular RR, no murmurs, rubs, or gallops.  Respiratory:   Clear to auscultation bilaterally. GI: Soft, nontender, non-distended  MS: No  edema; No deformity. Neuro:  Nonfocal  Psych: Normal affect , anxious  Labs    Chemistry Recent Labs  Lab 01/12/24 1430 01/16/24 0340 01/17/24 0258 01/18/24 0349  NA 140 141 141 139  K 3.9  3.7 3.2* 3.4*  CL 106 109 106 103  CO2 17* 18* 23 24  GLUCOSE 95 107* 98 90  BUN 20 31* 25* 24*  CREATININE 1.57* 1.38* 1.42* 1.50*  CALCIUM  9.6 9.6 9.0 9.8  PROT 7.5  --  7.0  --   ALBUMIN 4.1  --  3.7  --   AST 18  --  32  --   ALT 18  --  33  --   ALKPHOS 94  --  87  --   BILITOT 1.0  --  0.8  --   GFRNONAA  --  44* 42* 39*  ANIONGAP  --  13 13 12      Hematology Recent Labs  Lab 01/16/24 0340 01/17/24 0258  WBC 7.5 5.2  RBC 4.77 4.68  HGB 13.0 12.7  HCT 40.5 39.9  MCV 84.9 85.3  MCH 27.3 27.1  MCHC 32.1 31.8  RDW 15.5 15.4  PLT 187 198    Cardiac EnzymesNo results for input(s): TROPONINI in the last 168 hours. No results for input(s): TROPIPOC in the last 168 hours.   BNP Recent Labs  Lab 01/16/24 0340  PROBNP 5,200.0*  DDimer No results for input(s): DDIMER in the last 168 hours.   Radiology    No results found.  Cardiac Studies   Echo:    1. Left ventricular ejection fraction, by estimation, is 50 to 55% with  beat to beat variability. The left ventricle has low normal function. The  left ventricle has no regional wall motion abnormalities. There is mild  left ventricular hypertrophy. Left  ventricular diastolic parameters are indeterminate. Elevated filling  pressures, E/e' 17.   2. Right ventricular systolic function is mildly reduced. The right  ventricular size is normal. There is moderately elevated pulmonary artery  systolic pressure. The estimated right ventricular systolic pressure is  53.7 mmHg.   3. Left atrial size was severely dilated.   4. Right atrial size was severely dilated.   5. The mitral valve is degenerative. Mild to moderate mitral valve  regurgitation. No evidence of mitral stenosis.   6. The aortic valve is tricuspid. There is mild calcification of the  aortic valve. Aortic valve regurgitation is mild to moderate. Aortic valve  sclerosis/calcification is present, without any evidence of aortic  stenosis.   7.  Aortic dilatation noted. There is borderline dilatation of the  ascending aorta, measuring 40 mm.   8. The inferior vena cava is dilated in size with <50% respiratory  variability, suggesting right atrial pressure of 15 mmHg.   Patient Profile     58 y.o. female with atrial fibirllation/flutter s/p ablation, COPD, OSA not on CPAP, CKD 3a, CVA, hypertension, and polysubstance abuse (cocaine, tobacco) admitted with CAP and atrial fibrillation with RVR.   Assessment & Plan    Acute on chronic HFpEF/Moderately elevated pulmonary pressure:  Intake and output are incomplete.  Continue diuresis with lasix .  Plan right heart cath which can be done as an out patient.   Lisinopril  increased today.  If BP allows start SGLT2 inhibitor tomorrow.    PAF: S/P ablation.  Rate now controlled on diltiazem .  Transitioned from IV to oral Cardizem .  On DOAC.  Rate is controlled    Hyperlipidemia:  Continue atorvastatin .   Hypertension: Continue lisinopril  and spironolactone .    For questions or updates, please contact [facility] Please consult [email or web address] for contact info under Cardiology/STEMI.   Signed, Lynwood Schilling, MD  01/18/2024, 2:03 PM

## 2024-01-18 NOTE — Progress Notes (Signed)
 [facility] PROGRESS NOTE    Progress Note  Morgan Bean  FMW:[id number] DOB: 09-28-1965 DOA: 01/16/2024 PCP: Delbert Clam, MD     Brief Narrative:   Morgan Bean is an 58 y.o. female past medical history of a flutter A-fib and a left atrial thrombus on Xarelto , chronic kidney disease stage IIIa, history of CVA polysubstance abuse including cocaine and tobacco comes in with shortness of breath, productive cough and yellow sputum in the ED was found to be hypertensive and A-fib with RVR   Assessment/Plan:   Persistent atrial fibrillation with rapid ventricular response ([name]) Initially started on IV Cardizem  infusion now transition to oral Cardizem , suspect noncompliance in the setting of substance use. UDS was positive for cocaine. Her home dose of Xarelto  was resumed. 2D echo EF of 55% no regional wall motion abnormality with diastolic dysfunction. Continue oral diltiazem .  She is currently rate controlled.  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 Continue strict I's and O's and daily weights. Cardiology was consulted recommended to increase Lasix . Monitor electrolytes and replete as needed. Her 2D echo showed preserved EF but does show RV dilation. Cardiology recommended right heart cath, patient more euvolemic. Continue lisinopril .  Uncontrolled hypertension: Improved but still elevated continue oral diltiazem , IV Lasix , lisinopril  and Aldactone . Check a basic metabolic panel tomorrow. Potassium is low this morning replete recheck in the morning  Chronic kidney disease stage IIIa: Baseline creatinine 1.3-1.5, her creatinine appears to be at baseline continue to monitor as we are diuresing her.  COPD: Continue inhalers satting well on room air.  History of CVA: Continue Lipitor .  Polysubstance abuse: She has been counseled.  Noncompliance: She has been counseled.   DVT prophylaxis: Xarelto  Family Communication:none Status is:  Observation The patient will require care spanning > 2 midnights and should be moved to inpatient because: Cardiac telemetry    Code Status:     Code Status Orders  (From admission, onward)           Start     Ordered   01/16/24 0823  Full code  Continuous       Question:  By:  Answer:  Consent: discussion documented in EHR   01/16/24 0822           Code Status History     Date Active Date Inactive Code Status Order ID Comments User Context   04/14/2021 0212 04/17/2021 1956 Full Code [id number]  Lonzell Emeline HERO, DO ED   12/25/2020 0732 12/28/2020 2142 Full Code [id number]  Claudene Maximino LABOR, MD ED   05/28/2020 0924 05/29/2020 2146 Full Code [id number]  Arminda Daved HERO, MD ED   12/16/2018 2317 12/22/2018 2304 Full Code [id number]  Kerman Soulier, MD ED   04/25/2018 1714 04/26/2018 2003 Full Code [id number]  Braden Lash, MD Inpatient   04/25/2018 1714 04/25/2018 1714 Full Code [id number]  Braden Lash, MD Inpatient   03/08/2018 2233 03/14/2018 2107 Full Code [id number]  Charlton Evalene RAMAN, MD ED   04/23/2015 1116 04/23/2015 2040 Full Code [id number]  Rolan Ezra RAMAN, MD Inpatient   06/01/2014 1703 06/03/2014 1512 Full Code [id number]  Rama, Tawni SQUIBB, MD Inpatient   07/04/2013 0052 07/06/2013 2102 Full Code [id number]  Alm Maxwell LABOR, MD Inpatient   06/07/2013 1247 06/09/2013 1255 Full Code [id number]  Philip Cornet, MD Inpatient   06/03/2013 0036 06/07/2013 1247 Full Code [id number]  Jeannett Liming, DO Inpatient   04/05/2013 1229 04/05/2013 1717 Full Code [id number]  Arlana Arnt, MD Inpatient  11/12/2011 1937 11/13/2011 1949 Full Code [id number]  Carita Senior, MD ED         IV Access:   Peripheral IV   Procedures and diagnostic studies:   No results found.    Medical Consultants:   None.   Subjective:    Morgan Bean relates her breathing is much better. Objective:    Vitals:   01/18/24 0034 01/18/24 0414 01/18/24 0500 01/18/24 0822  BP: (!) 157/106 (!) 142/115    Pulse: (!) 53  62    Resp: 18 14    Temp: 97.7 F (36.5 C) 98.5 F (36.9 C)    TempSrc: Oral Oral    SpO2: 96% 99%  99%  Weight:   76.2 kg   Height:       SpO2: 99 %   Intake/Output Summary (Last 24 hours) at 01/18/2024 0957 Last data filed at 01/17/2024 2200 Gross per 24 hour  Intake 464.1 ml  Output 400 ml  Net 64.1 ml   Filed Weights   01/16/24 0907 01/17/24 0500 01/18/24 0500  Weight: 79.7 kg 79.6 kg 76.2 kg    Exam: General exam: In no acute distress. Respiratory system: Good air movement and clear to auscultation. Cardiovascular system: S1 & S2 heard, RRR. No JVD. Gastrointestinal system: Abdomen is nondistended, soft and nontender.  Extremities: No pedal edema. Skin: No rashes, lesions or ulcers Psychiatry: Judgement and insight appear normal. Mood & affect appropriate.   Data Reviewed:    Labs: Basic Metabolic Panel: Recent Labs  Lab 01/12/24 1430 01/16/24 0340 01/17/24 0258 01/18/24 0349  NA 140 141 141 139  K 3.9 3.7 3.2* 3.4*  CL 106 109 106 103  CO2 17* 18* 23 24  GLUCOSE 95 107* 98 90  BUN 20 31* 25* 24*  CREATININE 1.57* 1.38* 1.42* 1.50*  CALCIUM  9.6 9.6 9.0 9.8   GFR Estimated Creatinine Clearance: 41.6 mL/min (A) (by C-G formula based on SCr of 1.5 mg/dL (H)). Liver Function Tests: Recent Labs  Lab 01/12/24 1430 01/17/24 0258  AST 18 32  ALT 18 33  ALKPHOS 94 87  BILITOT 1.0 0.8  PROT 7.5 7.0  ALBUMIN 4.1 3.7   No results for input(s): LIPASE, AMYLASE in the last 168 hours. No results for input(s): AMMONIA in the last 168 hours. Coagulation profile No results for input(s): INR, PROTIME in the last 168 hours. COVID-19 Labs  No results for input(s): DDIMER, FERRITIN, LDH, CRP in the last 72 hours.  Lab Results  Component Value Date   SARSCOV2NAA POSITIVE (A) 04/13/2021   SARSCOV2NAA NEGATIVE 12/25/2020   SARSCOV2NAA NEGATIVE 05/28/2020   SARSCOV2NAA NEGATIVE 03/01/2019    CBC: Recent Labs  Lab 01/16/24 0340  01/17/24 0258  WBC 7.5 5.2  HGB 13.0 12.7  HCT 40.5 39.9  MCV 84.9 85.3  PLT 187 198   Cardiac Enzymes: No results for input(s): CKTOTAL, CKMB, CKMBINDEX, TROPONINI in the last 168 hours. BNP (last 3 results) Recent Labs    01/16/24 0340  PROBNP 5,200.0*   CBG: No results for input(s): GLUCAP in the last 168 hours. D-Dimer: No results for input(s): DDIMER in the last 72 hours. Hgb A1c: No results for input(s): HGBA1C in the last 72 hours. Lipid Profile: No results for input(s): CHOL, HDL, LDLCALC, TRIG, CHOLHDL, LDLDIRECT in the last 72 hours. Thyroid  function studies: Recent Labs    01/16/24 0903  TSH 1.560   Anemia work up: No results for input(s): VITAMINB12, FOLATE, FERRITIN, TIBC, IRON, RETICCTPCT in the last  72 hours. Sepsis Labs: Recent Labs  Lab 01/16/24 0340 01/16/24 0903 01/17/24 0258  PROCALCITON  --  <0.10  --   WBC 7.5  --  5.2   Microbiology Recent Results (from the past 240 hours)  MRSA Next Gen by PCR, Nasal     Status: None   Collection Time: 01/16/24  9:32 AM   Specimen: Nasal Mucosa; Nasal Swab  Result Value Ref Range Status   MRSA by PCR Next Gen NOT DETECTED NOT DETECTED Final    Comment: (NOTE) The GeneXpert MRSA Assay (FDA approved for NASAL specimens only), is one component of a comprehensive MRSA colonization surveillance program. It is not intended to diagnose MRSA infection nor to guide or monitor treatment for MRSA infections. Test performance is not FDA approved in patients less than 40 years old. Performed at [facility], [address], [place], [place] [address]      Medications:    atorvastatin   80 mg Oral Daily   budesonide -glycopyrrolate-formoterol   2 puff Inhalation BID   diltiazem   360 mg Oral Daily   DULoxetine   60 mg Oral Daily   furosemide   60 mg Intravenous BID   influenza vac split trivalent PF  0.5 mL Intramuscular Tomorrow-1000   lisinopril   20 mg  Oral Daily   pantoprazole   40 mg Oral Daily   Rivaroxaban   15 mg Oral Q supper   sodium bicarbonate   650 mg Oral BID   spironolactone   25 mg Oral Daily   Continuous Infusions:  diltiazem  (CARDIZEM ) infusion Stopped (01/17/24 1229)      LOS: 1 day   Morgan Bean  [facility]  01/18/2024, 9:57 AM

## 2024-01-18 NOTE — Telephone Encounter (Signed)
 Pharmacy Patient Advocate Encounter  Received notification from [employer] [place] [employer] that Prior Authorization for Trelegy Ellipta  100-62.5-25MCG/ACT aerosol powder has been APPROVED from 01/18/2024 to 01/17/2025. Ran test claim, Copay is $4.00. This test claim was processed through [facility]- copay amounts may vary at other pharmacies due to pharmacy/plan contracts, or as the patient moves through the different stages of their insurance plan.   PA #/Case ID/Reference #: [id number]

## 2024-01-18 NOTE — Telephone Encounter (Signed)
 Clinical questions and answers have been submitted based off of past history of use of Advair Diskus and Dulera . I will provide an update once a determination has been received.

## 2024-01-18 NOTE — TOC Initial Note (Signed)
 Transition of Care [facility]) - Initial/Assessment Note   Patient Details  Name: Morgan Bean MRN: [id number] Date of Birth: 1966-01-15  Transition of Care [facility] CM/SW Contact:    Duwaine GORMAN Aran, LCSW Phone Number: 01/18/2024, 1:58 PM  Clinical Narrative: [facility] consulted for substance use resources. Patient agreeable to resources. CSW added resources to patient's AVS.  Expected Discharge Plan: Home/Self Care Barriers to Discharge: Continued Medical Work up  Patient Goals and CMS Choice Patient states their goals for this hospitalization and ongoing recovery are:: Get resources Choice offered to / list presented to : NA  Expected Discharge Plan and Services In-house Referral: Clinical Social Work Post Acute Care Choice: NA Living arrangements for the past 2 months: Single Family Home          DME Arranged: N/A DME Agency: NA  Prior Living Arrangements/Services Living arrangements for the past 2 months: Single Family Home Patient language and need for interpreter reviewed:: Yes Do you feel safe going back to the place where you live?: Yes      Need for Family Participation in Patient Care: No (Comment) Care giver support system in place?: Yes (comment) Criminal Activity/Legal Involvement Pertinent to Current Situation/Hospitalization: No - Comment as needed  Activities of Daily Living ADL Screening (condition at time of admission) Independently performs ADLs?: Yes (appropriate for developmental age) (For the most part) Is the patient deaf or have difficulty hearing?: No Does the patient have difficulty seeing, even when wearing glasses/contacts?: No Does the patient have difficulty concentrating, remembering, or making decisions?: No  Emotional Assessment Appearance:: Appears stated age Attitude/Demeanor/Rapport: Engaged Affect (typically observed): Accepting Orientation: : Oriented to Self, Oriented to Place, Oriented to  Time, Oriented to Situation Alcohol / Substance Use:  Illicit Drugs Psych Involvement: No (comment)  Admission diagnosis:  Acute on chronic diastolic heart failure ([name]) [I50.33] Renal insufficiency [N28.9] Atrial fibrillation with rapid ventricular response ([name]) [I48.91] Elevated random blood glucose level [R73.9] Acute respiratory failure with hypoxemia ([name]) [J96.01] Paroxysmal atrial fibrillation with rapid ventricular response ([name]) [I48.0] Atrial fibrillation with RVR ([name]) [I48.91] Patient Active Problem List   Diagnosis Date Noted   Paroxysmal atrial fibrillation with rapid ventricular response ([name]) 01/16/2024   Chronic obstructive pulmonary disease, unspecified COPD type ([name]) 05/07/2022   COVID-19 virus infection 04/14/2021   Hypokalemia 04/14/2021   Chronic kidney disease, stage 3a ([name]) 04/14/2021   Elevated troponin 12/25/2020   Dysphagia 12/25/2020   Acute on chronic congestive heart failure ([name])    Cocaine abuse ([name])    Atrial flutter with rapid ventricular response ([name]) 05/28/2020   Autoimmune disease 12/27/2018   Laryngitis, chronic 12/26/2018   Febrile neutropenia 12/16/2018   Chronic anticoagulation 10/14/2018   CRI (chronic renal insufficiency), stage 3 (moderate) 10/14/2018   Upper airway cough syndrome 06/30/2018   Tobacco dependence in remission 04/11/2018   Substance abuse in remission ([name]) 04/11/2018   Depression, recurrent 04/11/2018   PAF (paroxysmal atrial fibrillation) ([name]) 03/10/2018   Persistent atrial fibrillation with RVR ([name])    Nonrheumatic mitral valve regurgitation    Hypertensive urgency 03/08/2018   Acute respiratory failure with hypoxemia ([name]) 03/08/2018   Unspecified atrial flutter ([name]) 04/25/2016   Tobacco use 11/30/2015   Pancytopenia, acquired ([name]) 07/16/2015   Head and neck lymphadenopathy 07/16/2015   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 05/23/18  05/08/2015   Absolute anemia 02/25/2015   Colon cancer screening 12/27/2014   Primary stabbing headache  12/26/2014   History of stroke 12/26/2014  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   Headache 02/22/2014   Dental caries 01/09/2014   Cough 11/13/2013   Cigarette smoker 11/13/2013   Swelling of gums 11/13/2013   Fatigue 11/10/2013   Intracranial atherosclerosis 11/10/2013   Hyperreflexia 10/04/2013   TIA (transient ischemic attack) 10/04/2013   Fibroid uterus 09/14/2012   Perimenopause 09/14/2012   Chronic cough 08/12/2012   DOE (dyspnea on exertion) 08/12/2012   History of CVA (cerebrovascular accident) 08/09/2012   Pulmonary hypertension ([name]) 04/06/2012   Substance abuse ([name]) 04/06/2012   History of cocaine abuse ([name]) 11/16/2011   Major depressive disorder, recurrent episode with psychotic features. 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 [place] [address] Phone: [phone] Fax: [phone]  Social Drivers of Health (SDOH) Social History: SDOH Screenings   Food Insecurity: No Food Insecurity (01/16/2024)  Recent Concern: Food Insecurity - Food Insecurity Present (10/21/2023)  Housing: Low Risk  (01/16/2024)  Recent Concern: Housing - High Risk (10/21/2023)  Transportation Needs: No Transportation Needs (01/16/2024)  Recent Concern: Transportation Needs - Unmet Transportation Needs (10/21/2023)  Utilities: Not At Risk (01/16/2024)  Alcohol Screen: Low Risk  (10/21/2023)  Depression (PHQ2-9): High Risk (10/25/2023)  Financial Resource Strain: Medium Risk (10/21/2023)  Physical Activity: Inactive (10/21/2023)  Social Connections: Socially Isolated (10/21/2023)  Stress: Stress Concern Present (10/21/2023)  Tobacco Use: Medium Risk (01/17/2024)    SDOH Interventions: Food Insecurity Interventions: Intervention Not Indicated, Inpatient TOC Transportation Interventions: Intervention Not Indicated, Inpatient TOC  Readmission Risk Interventions     No data to display

## 2024-01-18 NOTE — Telephone Encounter (Signed)
 Can we complete a prior authorization as she needs Trelegy?

## 2024-01-19 ENCOUNTER — Other Ambulatory Visit: Payer: Self-pay

## 2024-01-19 ENCOUNTER — Encounter: Payer: Self-pay | Admitting: Pharmacy Technician

## 2024-01-19 DIAGNOSIS — I480 Paroxysmal atrial fibrillation: Secondary | ICD-10-CM | POA: Diagnosis not present

## 2024-01-19 LAB — BASIC METABOLIC PANEL WITH GFR
Anion gap: 11 (ref 5–15)
BUN: 33 mg/dL — ABNORMAL HIGH (ref 6–20)
CO2: 26 mmol/L (ref 22–32)
Calcium: 10.2 mg/dL (ref 8.9–10.3)
Chloride: 100 mmol/L (ref 98–111)
Creatinine, Ser: 1.77 mg/dL — ABNORMAL HIGH (ref 0.44–1.00)
GFR, Estimated: 32 mL/min — ABNORMAL LOW (ref >60)
Glucose, Bld: 87 mg/dL (ref 70–99)
Potassium: 4.5 mmol/L (ref 3.5–5.1)
Sodium: 137 mmol/L (ref 135–145)

## 2024-01-19 MED ORDER — FUROSEMIDE 40 MG PO TABS
40.0000 mg | ORAL_TABLET | Freq: Every day | ORAL | Status: DC
  Administered 2024-01-20: 40 mg via ORAL
  Filled 2024-01-19: qty 1

## 2024-01-19 MED ORDER — FUROSEMIDE 40 MG PO TABS: 40.0000 mg | ORAL_TABLET | Freq: Every day | ORAL | Status: DC

## 2024-01-19 NOTE — Telephone Encounter (Signed)
 PA request has been Approved. For additional info see Pharmacy Prior Auth telephone encounter from 01/17/24.

## 2024-01-19 NOTE — Progress Notes (Signed)
 PROGRESS NOTE    Morgan Bean  FMW:[id number] DOB: 1965/04/11 DOA: 01/16/2024 PCP: Delbert Clam, MD   Brief Narrative: 58 y.o. female past medical history of a flutter A-fib and a left atrial thrombus on Xarelto , chronic kidney disease stage IIIa, history of CVA polysubstance abuse including cocaine and tobacco comes in with shortness of breath, productive cough and yellow sputum in the ED was found to be hypertensive and A-fib with RVR     Assessment & Plan:   Principal Problem:   Paroxysmal atrial fibrillation with rapid ventricular response ([name]) Active Problems:   Major depressive disorder, recurrent episode with psychotic features.   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 UDS was positive for cocaine. Her home dose of Xarelto  was resumed. 2D echo EF of 55% no regional wall motion abnormality with diastolic dysfunction. Continue oral diltiazem .  She is currently rate controlled.   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 Monitor electrolytes and replete as needed. Her 2D echo showed preserved EF but does show RV dilation. Cardiology recommended right heart cath, patient more euvolemic. Continue lisinopril . Creatinine 1.77 from 1.50 from 1.42 Potassium 4.5   Uncontrolled hypertension: Improved but still elevated continue oral diltiazem , IV Lasix , lisinopril  and Aldactone .   Chronic kidney disease  stage IIIa: Baseline creatinine 1.3-1.5, her creatinine appears to be at baseline continue to monitor as we are diuresing her.   COPD: Continue inhalers satting well on room air.   History of CVA: Continue Lipitor .   Polysubstance abuse: She has been counseled.   Noncompliance: She has been counseled.   Estimated body mass index is 26.89 kg/m as calculated from the following:   Height as of this encounter: 5' 6 (1.676 m).   Weight as of this encounter: 75.6 kg.  DVT prophylaxis: Xarelto  Code Status: Full code  family Communication: None  disposition Plan:  Status is: Inpatient Remains inpatient appropriate because: Acute illness   Consultants:  Cardiology  Procedures: Echo cardiogram  antimicrobials: None  Subjective: Resting in bed able to lay flat reports she slept well overnight denies chest pain feels breathing is better than yesterday  Objective: Vitals:   01/19/24 0500 01/19/24 0554 01/19/24 0815 01/19/24 0900  BP:  (!) 134/104 (!) 139/105   Pulse:  79    Resp:  16    Temp:  98 F (36.7 C)    TempSrc:  Oral    SpO2:  100%  100%  Weight: 75.6 kg     Height:        Intake/Output Summary (Last 24 hours) at 01/19/2024 1329 Last data filed at 01/18/2024 2200 Gross per 24 hour  Intake 200 ml  Output --  Net 200 ml   Filed Weights   01/17/24 0500 01/18/24 0500 01/19/24 0500  Weight: 79.6 kg 76.2 kg 75.6 kg    Examination:  General exam: Appears in nad  Respiratory system: Clear to auscultation. Respiratory effort normal. Cardiovascular system: S1 & S2 heard, RRR. No JVD,  murmurs, rubs, gallops or clicks.  Gastrointestinal system: Abdomen is nondistended, soft and nontender. No organomegaly or masses felt. Normal bowel sounds heard. Central nervous system: Alert and oriented. No focal neurological deficits. Extremities no edema  Data Reviewed: I have personally reviewed following labs and imaging studies  CBC: Recent Labs  Lab 01/16/24 0340  01/17/24 0258  WBC 7.5 5.2  HGB 13.0 12.7  HCT 40.5 39.9  MCV 84.9 85.3  PLT 187 198   Basic Metabolic Panel: Recent Labs  Lab 01/12/24 1430 01/16/24 0340 01/17/24 0258 01/18/24 0349 01/19/24 0348  NA 140 141 141 139 137  K 3.9 3.7 3.2* 3.4* 4.5  CL 106 109 106 103 100  CO2 17* 18* 23 24 26   GLUCOSE 95 107* 98 90 87  BUN 20 31* 25* 24* 33*  CREATININE 1.57* 1.38* 1.42* 1.50* 1.77*  CALCIUM  9.6 9.6 9.0 9.8 10.2   GFR: Estimated Creatinine Clearance: 35.1 mL/min (A) (by C-G formula based on SCr of 1.77 mg/dL (H)). Liver Function Tests: Recent Labs  Lab 01/12/24 1430 01/17/24 0258  AST 18 32  ALT 18 33  ALKPHOS 94 87  BILITOT 1.0 0.8  PROT 7.5 7.0  ALBUMIN 4.1 3.7   No results for input(s): LIPASE, AMYLASE in the last 168 hours. No results for input(s): AMMONIA in the last 168 hours. Coagulation Profile: No results for input(s): INR, PROTIME in the last 168 hours. Cardiac Enzymes: No results for input(s): CKTOTAL, CKMB, CKMBINDEX, TROPONINI in the last 168 hours. BNP (last 3 results) Recent Labs    01/16/24 0340  PROBNP 5,200.0*   HbA1C: No results for input(s): HGBA1C in the last 72 hours. CBG: No results for input(s): GLUCAP in the last 168 hours. Lipid Profile: No results for input(s): CHOL, HDL, LDLCALC, TRIG, CHOLHDL, LDLDIRECT in the last 72 hours. Thyroid  Function Tests: No results for input(s): TSH, T4TOTAL, FREET4, T3FREE, THYROIDAB in the last 72 hours. Anemia Panel: No results for input(s): VITAMINB12, FOLATE, FERRITIN, TIBC, IRON, RETICCTPCT in the last 72 hours. Sepsis Labs: Recent Labs  Lab 01/16/24 0903  PROCALCITON <0.10    Recent Results (from the past 240 hours)  MRSA Next Gen by PCR, Nasal     Status: None   Collection Time: 01/16/24  9:32 AM   Specimen: Nasal Mucosa; Nasal Swab  Result Value Ref Range Status   MRSA by PCR Next Gen NOT DETECTED NOT DETECTED Final     Comment: (NOTE) The GeneXpert MRSA Assay (FDA approved for NASAL specimens only), is one component of a comprehensive MRSA colonization surveillance program. It is not intended to diagnose MRSA infection nor to guide or monitor treatment for MRSA infections. Test performance is not FDA approved in patients less than 16 years old. Performed at [facility], [address], [place], [place] [address]      Radiology Studies: No results found.   Scheduled Meds:  atorvastatin   80 mg Oral Daily   budesonide -glycopyrrolate-formoterol   2 puff Inhalation BID   diltiazem   360 mg Oral Daily   DULoxetine   60 mg Oral Daily   [START ON 01/20/2024] furosemide   40 mg Oral Daily   lisinopril   20 mg Oral Daily   pantoprazole   40 mg Oral Daily   Rivaroxaban   15 mg Oral Q supper   sodium bicarbonate   650 mg Oral BID   spironolactone   25 mg Oral Daily   Continuous Infusions:   LOS: 2 days     Almarie KANDICE Hoots, MD  01/19/2024, 1:29 PM

## 2024-01-19 NOTE — Progress Notes (Addendum)
 Progress Note  Patient Name: Morgan Bean Date of Encounter: 01/19/2024 [facility] Cardiologist: Annabella Scarce, MD   Interval Summary   Today she feels well.  Able to lay flat.  Feels that she is back at baseline.  No shortness of breath.  Has worsening creatinine levels today.  Vital Signs Vitals:   01/18/24 2018 01/19/24 0500 01/19/24 0554 01/19/24 0815  BP: (!) 153/121  (!) 134/104 (!) 139/105  Pulse: 87  79   Resp: 16  16   Temp: 98.7 F (37.1 C)  98 F (36.7 C)   TempSrc: Oral  Oral   SpO2: 100%  100%   Weight:  75.6 kg    Height:        Intake/Output Summary (Last 24 hours) at 01/19/2024 0859 Last data filed at 01/18/2024 2200 Gross per 24 hour  Intake 560 ml  Output --  Net 560 ml      01/19/2024    5:00 AM 01/18/2024    5:00 AM 01/17/2024    5:00 AM  Last 3 Weights  Weight (lbs) 166 lb 9.6 oz 167 lb 15.9 oz 175 lb 7.8 oz  Weight (kg) 75.569 kg 76.2 kg 79.6 kg      Telemetry/ECG  Atrial fibrillation heart rate between 80-100- Personally Reviewed  Physical Exam  GEN: No acute distress.   Neck: + JVD Cardiac: IRRR, no murmurs, rubs, or gallops.  Respiratory: Clear to auscultation bilaterally. GI: Soft, nontender, non-distended  MS: No edema  Patient Profile Patient with past medical history significant for persistent atrial fibrillation/flutter with history of ablation and recurrence, left atrial appendage thrombus, cocaine use, hypertension, CKD, chronic HFpEF, CVA, pulmonary hypertension, COPD, OSA not on CPAP.  Cardiology consulted for A-fib RVR and HFpEF exacerbation.  UDS positive for cocaine.  Assessment & Plan   Acute on chronic HFpEF Nw pulmonary hypertension Echo here with EF 50 to 55%.  E/e' 17.  Mildly reduced RV with moderately elevated PASP, RVSP 53.7.  Severely dilated atria.  Mild to moderate MR.  Mild to moderate AR.  RA pressure on admission 15.  She has elevated PA pressures that could be related to hypervolemia  versus COPD and untreated OSA.  Diuresed on IV Lasix  60 mg twice daily throughout this admission with excellent response although I's and O's have not been documented accurately.  Weight admission 175.  Today 166 pounds.  She feels back at baseline.  Has had worsening creatinine levels and BUN suggesting she is at euvolemia.  Still with some JVD with strain but the absence of any other signs of congestion think she's close to euvolemia. Got one dose already. Stop IV Lasix .  Restart p.o. Lasix  40 mg.  Reevaluate tomorrow if symptoms and volume status stable on this regimen. Continue spironolactone  25 mg. At discharge or follow-up would look to adding SGLT2 inhibitor if she is able to afford this.  With elevated Cr today will hold off. Will defer to MD about right heart catheterization.  Reasonable to repeat echocardiogram first to reevaluate filling pressures now that she has been diuresed but RHC would also give us  better idea of her volume status too.  Previous plans were for outpatient.  Persistent atrial fibrillation History of ablation 2018 with recurrence History of LAA thrombus Probably would plan for rate control strategy only.  She has recurrent history of cocaine abuse and with positive UDS here likely complicating things with severely dilated atria.  Rates are controlled here between 80-100. Continue with Xarelto  15  mg, on reduced dosing due to decreased creatinine clearance.  She has missed doses prior to admission due to feeling unwell. Continue diltiazem  360 mg daily, need to be mindful of EF drops. Avoid beta-blockers with cocaine use.  Hypertension Today 139/105.  Lisinopril  just increased yesterday.  She needs to keep blood pressure log and bring to upcoming appointment. Continue diltiazem  360 mg, lisinopril  20 mg, spironolactone  25 mg.  Aortic dilatation 40 mm.  Needs annual monitoring.  OSA Not on CPAP.  COPD Per primary team.  For questions or updates, please contact [facility] Please consult [email or web address] for contact info under       Signed, Thom LITTIE Sluder, PA-C     Patient seen and examined, note reviewed with the signed Advanced Practice Provider. I personally reviewed laboratory data, imaging studies and relevant notes. I independently examined the patient and formulated the important aspects of the plan. I have personally discussed the plan with the patient and/or family. Comments or changes to the note/plan are indicated below.  Patient seen examined at bedside.  New onset heart failure with preserved ejection fraction Pulmonary hypertension Persistent atrial fibrillation status post ablation 2018 with recurrence, history of left atrial thrombus on Xarelto  and Cardizem  Hypertension Aortic dilatation  OSA  She had a great response to her diuretics currently she appears to be euvolemic agree with oral diuretics at this time.  She will need to be followed closely in the outpatient setting she will benefit from right heart catheterization in our advanced heart failure team evaluation in terms of her pulmonary hypertension.  For her atrial fibrillation for now rate control has been strategy unclear if this is permanent A-fib based on patient and physician discussion.  But in the clinical setting we will continue her Cardizem  and her Xarelto  will not pursue rhythm control at this time. There has been avoidance of beta-blocker due to use of cocaine.  She is hypertensive still would suggest stopping the lisinopril  at the time of discharge and using olmesartan 20 mg daily which will help with better blood pressure control and this is not on formulary.  For now continue Cardizem , lisinopril  and Aldactone   She will need to follow-up in the outpatient setting for repeat chest CTA to monitor the aortic dilatation.  OSA needs to be on CPAP but does not use   Heith Haigler DO, MS [facility] Attending Cardiologist [facility]  [address] [place], [place] [address] [phone] Website: [email or web address]

## 2024-01-20 ENCOUNTER — Other Ambulatory Visit: Payer: Self-pay

## 2024-01-20 ENCOUNTER — Other Ambulatory Visit (HOSPITAL_COMMUNITY): Payer: Self-pay

## 2024-01-20 DIAGNOSIS — I480 Paroxysmal atrial fibrillation: Secondary | ICD-10-CM | POA: Diagnosis not present

## 2024-01-20 LAB — BASIC METABOLIC PANEL WITH GFR
Anion gap: 15 (ref 5–15)
BUN: 40 mg/dL — ABNORMAL HIGH (ref 6–20)
CO2: 22 mmol/L (ref 22–32)
Calcium: 9.8 mg/dL (ref 8.9–10.3)
Chloride: 100 mmol/L (ref 98–111)
Creatinine, Ser: 1.88 mg/dL — ABNORMAL HIGH (ref 0.44–1.00)
GFR, Estimated: 30 mL/min — ABNORMAL LOW (ref >60)
Glucose, Bld: 99 mg/dL (ref 70–99)
Potassium: 4 mmol/L (ref 3.5–5.1)
Sodium: 137 mmol/L (ref 135–145)

## 2024-01-20 MED ORDER — HYDROXYZINE HCL 25 MG PO TABS
25.0000 mg | ORAL_TABLET | Freq: Three times a day (TID) | ORAL | 0 refills | Status: AC | PRN
  Filled 2024-01-20 (×2): qty 30, 10d supply, fill #0

## 2024-01-20 MED ORDER — RIVAROXABAN 15 MG PO TABS
15.0000 mg | ORAL_TABLET | Freq: Every day | ORAL | 2 refills | Status: AC
  Filled 2024-01-20 (×2): qty 42, 42d supply, fill #0

## 2024-01-20 MED ORDER — OLMESARTAN MEDOXOMIL 20 MG PO TABS
10.0000 mg | ORAL_TABLET | Freq: Every day | ORAL | 2 refills | Status: AC
  Filled 2024-01-20 (×2): qty 45, 90d supply, fill #0

## 2024-01-20 MED ORDER — FUROSEMIDE 40 MG PO TABS
40.0000 mg | ORAL_TABLET | Freq: Every day | ORAL | 0 refills | Status: AC
  Filled 2024-01-20: qty 90, 90d supply, fill #0

## 2024-01-20 MED ORDER — SPIRONOLACTONE 25 MG PO TABS
25.0000 mg | ORAL_TABLET | Freq: Every day | ORAL | 2 refills | Status: AC
  Filled 2024-01-20 (×2): qty 90, 90d supply, fill #0

## 2024-01-20 NOTE — Plan of Care (Signed)
   Problem: Clinical Measurements: Goal: Will remain free from infection Outcome: Progressing   Problem: Clinical Measurements: Goal: Respiratory complications will improve Outcome: Progressing   Problem: Clinical Measurements: Goal: Cardiovascular complication will be avoided Outcome: Progressing

## 2024-01-20 NOTE — Progress Notes (Signed)
 Heart Failure Navigator Progress Note  Assessed for Heart & Vascular TOC clinic readiness.  Patient does not meet criteria due to EF 50-55%, has a scheduled [facility] appointment on 01/28/2024. No HF TOC. .   Navigator will sign off at this time.   Stephane Haddock, BSN, Scientist, Clinical (histocompatibility And Immunogenetics) Only

## 2024-01-20 NOTE — Progress Notes (Signed)
 Discharge instructions given to patient, questions asked and answered.

## 2024-01-20 NOTE — Progress Notes (Signed)
 Rounding Note   Patient Name: Morgan Bean Date of Encounter: 01/20/2024  [facility] Cardiologist: Annabella Scarce, MD   Subjective Patient breathing comfortably  No CP     Scheduled Meds:  atorvastatin   80 mg Oral Daily   budesonide -glycopyrrolate-formoterol   2 puff Inhalation BID   diltiazem   360 mg Oral Daily   DULoxetine   60 mg Oral Daily   furosemide   40 mg Oral Daily   lisinopril   20 mg Oral Daily   pantoprazole   40 mg Oral Daily   Rivaroxaban   15 mg Oral Q supper   sodium bicarbonate   650 mg Oral BID   spironolactone   25 mg Oral Daily   Continuous Infusions:  PRN Meds: acetaminophen  **OR** acetaminophen , fluticasone , hydrALAZINE , hydrOXYzine , mouth rinse   Vital Signs  Vitals:   01/20/24 0841 01/20/24 0906 01/20/24 1019 01/20/24 1318  BP: (!) 136/103 (!) 136/103 (!) 147/111 (!) 122/96  Pulse: 89  85 86  Resp: 18     Temp: 98.4 F (36.9 C)   98.2 F (36.8 C)  TempSrc: Oral   Oral  SpO2: 99%  99% 96%  Weight:      Height:        Intake/Output Summary (Last 24 hours) at 01/20/2024 1332 Last data filed at 01/20/2024 0830 Gross per 24 hour  Intake 170 ml  Output 300 ml  Net -130 ml      01/20/2024    5:00 AM 01/19/2024    5:00 AM 01/18/2024    5:00 AM  Last 3 Weights  Weight (lbs) 167 lb 166 lb 9.6 oz 167 lb 15.9 oz  Weight (kg) 75.751 kg 75.569 kg 76.2 kg      Telemetry Afib  Rates controlled  - Personally Reviewed  ECG  No new  - Personally Reviewed  Physical Exam  GEN: No acute distress.   Neck: No JVD Cardiac: Irreg irreg  No murmurs  Respiratory: Clear to auscultation bilaterally. GI: Soft, nontender, non-distended  MS: No edema;  Labs High Sensitivity Troponin:  No results for input(s): TROPONINIHS in the last 720 hours.   Chemistry Recent Labs  Lab 01/17/24 0258 01/18/24 0349 01/19/24 0348 01/20/24 0424  NA 141 139 137 137  K 3.2* 3.4* 4.5 4.0  CL 106 103 100 100  CO2 23 24 26 22   GLUCOSE 98 90 87 99   BUN 25* 24* 33* 40*  CREATININE 1.42* 1.50* 1.77* 1.88*  CALCIUM  9.0 9.8 10.2 9.8  PROT 7.0  --   --   --   ALBUMIN 3.7  --   --   --   AST 32  --   --   --   ALT 33  --   --   --   ALKPHOS 87  --   --   --   BILITOT 0.8  --   --   --   GFRNONAA 42* 39* 32* 30*  ANIONGAP 13 12 11 15     Lipids No results for input(s): CHOL, TRIG, HDL, LABVLDL, LDLCALC, CHOLHDL in the last 168 hours.  Hematology Recent Labs  Lab 01/16/24 0340 01/17/24 0258  WBC 7.5 5.2  RBC 4.77 4.68  HGB 13.0 12.7  HCT 40.5 39.9  MCV 84.9 85.3  MCH 27.3 27.1  MCHC 32.1 31.8  RDW 15.5 15.4  PLT 187 198   Thyroid   Recent Labs  Lab 01/16/24 0903  TSH 1.560    BNP Recent Labs  Lab 01/16/24 0340  PROBNP 5,200.0*  DDimer No results for input(s): DDIMER in the last 168 hours.   Radiology  No results found.  Cardiac Studies  Echo  01/16/24   1. Left ventricular ejection fraction, by estimation, is 50 to 55% with  beat to beat variability. The left ventricle has low normal function. The  left ventricle has no regional wall motion abnormalities. There is mild  left ventricular hypertrophy. Left  ventricular diastolic parameters are indeterminate. Elevated filling  pressures, E/e' 17.   2. Right ventricular systolic function is mildly reduced. The right  ventricular size is normal. There is moderately elevated pulmonary artery  systolic pressure. The estimated right ventricular systolic pressure is  53.7 mmHg.   3. Left atrial size was severely dilated.   4. Right atrial size was severely dilated.   5. The mitral valve is degenerative. Mild to moderate mitral valve  regurgitation. No evidence of mitral stenosis.   6. The aortic valve is tricuspid. There is mild calcification of the  aortic valve. Aortic valve regurgitation is mild to moderate. Aortic valve  sclerosis/calcification is present, without any evidence of aortic  stenosis.   7. Aortic dilatation noted. There is  borderline dilatation of the  ascending aorta, measuring 40 mm.   8. The inferior vena cava is dilated in size with <50% respiratory  variability, suggesting right atrial pressure of 15 mmHg.    Patient Profile   58 y.o. female   Assessment & Plan   1  HFpEF    Acute on chronic    Pt has diuresed with IV lasix    Wt down 9 lb  Breathing is back to baseline  Would switch lisinopril  to olmesartan  Oterhwise keep on 40 lasix  and 25 spiro      HTN    BP is improving     Switch of ACE I to ARB as noted   Contnue dilt, lasix , spiro PT has appt on 11/7  Atrial fibrillation    Pt s/p ablation in 2018  Now with recurrence. Keep on Diltiazem  and Xarelto     No b blocker given cocaine use   OK to d/c    Outpt follow up as noted above   WIll sign off   Signed, Vina Gull, MD  01/20/2024, 1:32 PM

## 2024-01-21 ENCOUNTER — Telehealth: Payer: Self-pay | Admitting: *Deleted

## 2024-01-21 NOTE — Discharge Summary (Signed)
 Physician Discharge Summary  Arkie Tagliaferro FMW:[id number] DOB: 1965-04-13 DOA: 01/16/2024  PCP: Delbert Clam, MD  Admit date: 01/16/2024 Discharge date: 01/21/2024  Admitted From: home Disposition:  home  Recommendations for Outpatient Follow-up:  Follow up with PCP in 1-2 weeks Please obtain BMP/CBC in one week Please follow up with cardiology  Home Health:no Equipment/Devices:none Discharge Condition:stable CODE STATUS:full Diet recommendation: cardiac Brief/Interim Summary:  58 y.o. female past medical history of a flutter A-fib and a left atrial thrombus on Xarelto , chronic kidney disease stage IIIa, history of CVA polysubstance abuse including cocaine and tobacco comes in with shortness of breath, productive cough and yellow sputum in the ED was found to be hypertensive and A-fib with RVR    Discharge Diagnoses:  Principal Problem:   Paroxysmal atrial fibrillation with rapid ventricular response ([name]) Active Problems:   Major depressive disorder, recurrent episode with psychotic features.   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 UDS was positive for cocaine. Her home dose of Xarelto  was resumed. 2D echo EF of 55% no regional wall motion abnormality with diastolic dysfunction. Continue oral diltiazem .  She is currently rate controlled.   Acute on chronic HFpEF with RV failure: Her 2D echo showed preserved EF but does show RV  dilation. Cardiology recommended right heart cath as OP Continue olmesartan lasix  diltiazem  aldactone  Creatinine 1.88 on dc     Uncontrolled hypertension: Improved but still elevated continue oral diltiazem , IV Lasix , lisinopril  and Aldactone .   Chronic kidney disease stage IIIa: Monitor  as OP   COPD: Continue inhalers satting well on room air.   History of CVA: Continue Lipitor .   Polysubstance abuse: She has been counseled.   Noncompliance: She has been counseled.    Estimated body mass index is 26.95 kg/m as calculated from the following:   Height as of this encounter: 5' 6 (1.676 m).   Weight as of this encounter: 75.8 kg.  Discharge Instructions  Discharge Instructions     Diet - low sodium heart healthy   Complete by: As directed    Diet - low sodium heart healthy   Complete by: As directed    Increase activity slowly   Complete by: As directed    Increase activity slowly   Complete by: As directed       Allergies as of 01/20/2024       Reactions   Cipro [ciprofloxacin Hcl] Itching   Cyprodenate Other (See Comments)   Unknown reaction        Medication List     STOP taking these medications    ibuprofen  200 MG tablet Commonly known as: ADVIL    lisinopril  10 MG tablet Commonly known as: ZESTRIL    potassium chloride  SA 20 MEQ tablet Commonly known as: KLOR-CON  M   Spiriva  HandiHaler 18 MCG Caps Generic drug: Tiotropium Bromide        TAKE these medications    acetaminophen  500 MG tablet Commonly known as: TYLENOL  Take 1,000 mg by mouth 2 (two) times daily as  needed for headache or fever (pain).   albuterol  108 (90 Base) MCG/ACT inhaler Commonly known as: Ventolin  HFA Inhale 2 puffs into the lungs every 6 (six) hours as needed for wheezing or shortness of breath.   atorvastatin  80 MG tablet Commonly known as: LIPITOR  Take 1 tablet (80 mg total) by mouth at bedtime. What changed: when to take this   diltiazem  360 MG 24 hr  capsule Commonly known as: Cardizem  CD Take 1 capsule (360 mg total) by mouth daily. Please call office to schedule appt with cardiologist for continued refills. [phone]   DULoxetine  60 MG capsule Commonly known as: Cymbalta  Take 1 capsule (60 mg total) by mouth daily.   fluticasone  50 MCG/ACT nasal spray Commonly known as: FLONASE  Place 2 sprays into both nostrils daily. What changed:  how much to take when to take this reasons to take this   furosemide  40 MG tablet Commonly known as: LASIX  Take 1 tablet (40 mg total) by mouth daily. What changed: additional instructions   hydrOXYzine  25 MG tablet Commonly known as: ATARAX  Take 1 tablet (25 mg total) by mouth every 8 (eight) hours as needed for anxiety or itching. What changed:  medication strength how much to take reasons to take this   olmesartan 20 MG tablet Commonly known as: BENICAR Take 0.5 tablets (10 mg total) by mouth daily.   omeprazole  40 MG capsule Commonly known as: PRILOSEC Take 1 capsule (40 mg total) by mouth daily. Take with water, wait 30-60 minutes after taking before eating or drinking anything but water What changed:  when to take this reasons to take this additional instructions   sodium bicarbonate  650 MG tablet Take 1 tablet (650 mg total) by mouth 2 (two) times daily.   spironolactone  25 MG tablet Commonly known as: ALDACTONE  Take 1 tablet (25 mg total) by mouth daily.   Trelegy Ellipta  100-62.5-25 MCG/ACT Aepb Generic drug: Fluticasone -Umeclidin-Vilant Inhale 1 puff into the lungs daily.   VITAMIN D -3 PO Take 2 each by mouth daily. OTC vitamin D3 gummy   Xarelto  15 MG Tabs tablet Generic drug: Rivaroxaban  Take 1 tablet (15 mg total) by mouth daily with supper. What changed: when to take this        Follow-up Information     Newlin, Enobong, MD Follow up.   Specialty: Family Medicine Contact information: [address] [place]  [address] [phone]                Allergies  Allergen Reactions   Cipro [Ciprofloxacin Hcl] Itching   Cyprodenate Other (See Comments)    Unknown reaction    Consultations: cardiology   Procedures/Studies: ECHOCARDIOGRAM COMPLETE Result Date: 01/16/2024    ECHOCARDIOGRAM REPORT   Patient Name:   Morgan Bean Date of Exam: 01/16/2024 Medical Rec #:  [id number]    Height:       65.5 in Accession #:    [id number]   Weight:       177.9 lb Date of Birth:  October 29, 1965    BSA:          1.893 m Patient Age:    58 years     BP:           158/111 mmHg Patient Gender: F            HR:           89 bpm. Exam Location:  Inpatient Procedure: 2D Echo, Color Doppler and Cardiac Doppler (Both Spectral and Color  Flow Doppler were utilized during procedure). Indications:    Afib I48.91  History:        Patient has prior history of Echocardiogram examinations, most                 recent 04/14/2021. CHF, CAD, TIA, Stroke and COPD,                 Arrythmias:Atrial Fibrillation and Atrial Flutter,                 Signs/Symptoms:Dyspnea and Fatigue; Risk Factors:Current Smoker                 and Sleep Apnea. H/O Mitral regurgitation, Pulmonary                 Hypertension, Polysubstance abuse.  Sonographer:    BERNARDA ROCKS Referring Phys: [id number] TAYE T GONFA IMPRESSIONS  1. Left ventricular ejection fraction, by estimation, is 50 to 55% with beat to beat variability. The left ventricle has low normal function. The left ventricle has no regional wall motion abnormalities. There is mild left ventricular hypertrophy. Left ventricular diastolic parameters are indeterminate. Elevated filling pressures, E/e' 17.  2. Right ventricular systolic function is mildly reduced. The right ventricular size is normal. There is moderately elevated pulmonary artery systolic pressure. The estimated right ventricular systolic pressure is 53.7 mmHg.  3. Left atrial size was severely dilated.  4. Right atrial size was  severely dilated.  5. The mitral valve is degenerative. Mild to moderate mitral valve regurgitation. No evidence of mitral stenosis.  6. The aortic valve is tricuspid. There is mild calcification of the aortic valve. Aortic valve regurgitation is mild to moderate. Aortic valve sclerosis/calcification is present, without any evidence of aortic stenosis.  7. Aortic dilatation noted. There is borderline dilatation of the ascending aorta, measuring 40 mm.  8. The inferior vena cava is dilated in size with <50% respiratory variability, suggesting right atrial pressure of 15 mmHg. FINDINGS  Left Ventricle: Left ventricular ejection fraction, by estimation, is 50 to 55%. The left ventricle has low normal function. The left ventricle has no regional wall motion abnormalities. The left ventricular internal cavity size was normal in size. There is mild left ventricular hypertrophy. Left ventricular diastolic function could not be evaluated due to atrial fibrillation. Left ventricular diastolic parameters are indeterminate. Right Ventricle: The right ventricular size is normal. No increase in right ventricular wall thickness. Right ventricular systolic function is mildly reduced. There is moderately elevated pulmonary artery systolic pressure. The tricuspid regurgitant velocity is 3.11 m/s, and with an assumed right atrial pressure of 15 mmHg, the estimated right ventricular systolic pressure is 53.7 mmHg. Left Atrium: Left atrial size was severely dilated. Right Atrium: Right atrial size was severely dilated. Pericardium: There is no evidence of pericardial effusion. Mitral Valve: The mitral valve is degenerative in appearance. Mild mitral annular calcification. Mild to moderate mitral valve regurgitation. No evidence of mitral valve stenosis. MV peak gradient, 8.6 mmHg. The mean mitral valve gradient is 3.0 mmHg. Tricuspid Valve: The tricuspid valve is grossly normal. Tricuspid valve regurgitation is mild . No evidence of  tricuspid stenosis. Aortic Valve: The aortic valve is tricuspid. There is mild calcification of the aortic valve. Aortic valve regurgitation is mild to moderate. Aortic regurgitation PHT measures 422 msec. Aortic valve sclerosis/calcification is present, without any evidence of aortic stenosis. Aortic valve mean gradient measures 4.0 mmHg. Aortic valve peak gradient measures 8.3 mmHg. Aortic valve area, by VTI measures  1.96 cm. Pulmonic Valve: The pulmonic valve was normal in structure. Pulmonic valve regurgitation is trivial. No evidence of pulmonic stenosis. Aorta: Aortic dilatation noted and the aortic root is normal in size and structure. There is borderline dilatation of the ascending aorta, measuring 40 mm. Venous: The inferior vena cava is dilated in size with less than 50% respiratory variability, suggesting right atrial pressure of 15 mmHg. IAS/Shunts: No atrial level shunt detected by color flow Doppler.  LEFT VENTRICLE PLAX 2D LVIDd:         4.80 cm LVIDs:         3.40 cm LV PW:         1.10 cm LV IVS:        1.00 cm LVOT diam:     2.00 cm LV SV:         52 LV SV Index:   27 LVOT Area:     3.14 cm  LV Volumes (MOD) LV vol d, MOD A2C: 172.0 ml LV vol d, MOD A4C: 155.0 ml LV vol s, MOD A2C: 78.1 ml LV vol s, MOD A4C: 61.3 ml LV SV MOD A2C:     93.9 ml LV SV MOD A4C:     155.0 ml LV SV MOD BP:      105.7 ml RIGHT VENTRICLE            IVC RV Basal diam:  4.80 cm    IVC diam: 2.70 cm RV S prime:     9.64 cm/s TAPSE (M-mode): 2.0 cm RVSP:           53.7 mmHg LEFT ATRIUM              Index         RIGHT ATRIUM            Index LA diam:        4.30 cm  2.27 cm/m    RA Pressure: 15.00 mmHg LA Vol (A2C):   207.0 ml 109.36 ml/m  RA Area:     27.90 cm LA Vol (A4C):   115.0 ml 60.76 ml/m   RA Volume:   103.00 ml  54.42 ml/m LA Biplane Vol: 162.0 ml 85.59 ml/m  AORTIC VALVE                    PULMONIC VALVE AV Area (Vmax):    2.25 cm     PV Vmax:          0.91 m/s AV Area (Vmean):   2.02 cm     PV Peak grad:      3.3 mmHg AV Area (VTI):     1.96 cm     PR End Diast Vel: 6.15 msec AV Vmax:           144.00 cm/s AV Vmean:          91.300 cm/s AV VTI:            0.263 m AV Peak Grad:      8.3 mmHg AV Mean Grad:      4.0 mmHg LVOT Vmax:         103.00 cm/s LVOT Vmean:        58.700 cm/s LVOT VTI:          0.164 m LVOT/AV VTI ratio: 0.62 AI PHT:            422 msec  AORTA Ao Root diam: 3.60 cm Ao Asc diam:  4.00 cm  MITRAL VALVE                  TRICUSPID VALVE MV Area (PHT): 4.08 cm       TR Peak grad:   38.7 mmHg MV Area VTI:   1.38 cm       TR Vmax:        311.00 cm/s MV Peak grad:  8.6 mmHg       Estimated RAP:  15.00 mmHg MV Mean grad:  3.0 mmHg       RVSP:           53.7 mmHg MV Vmax:       1.47 m/s MV Vmean:      75.7 cm/s      SHUNTS MV Decel Time: 186 msec       Systemic VTI:  0.16 m MR Peak grad:    146.2 mmHg   Systemic Diam: 2.00 cm MR Mean grad:    96.0 mmHg MR Vmax:         604.50 cm/s MR Vmean:        454.0 cm/s MR PISA:         5.09 cm MR PISA Eff ROA: 26 mm MR PISA Radius:  0.90 cm MV E velocity: 133.00 cm/s MV A velocity: 30.20 cm/s MV E/A ratio:  4.40 Soyla Merck MD Electronically signed by Soyla Merck MD Signature Date/Time: 01/16/2024/11:18:11 AM    Final    CT Angio Chest PE W and/or Wo Contrast Result Date: 01/16/2024 EXAM: CTA of the Chest without and with contrast for PE 01/16/2024 05:32:01 AM TECHNIQUE: CTA of the chest was performed without and with the administration of intravenous contrast (75 mL iohexol  (OMNIPAQUE ) 350 MG/ML injection). Multiplanar reformatted images are provided for review. MIP images are provided for review. Automated exposure control, iterative reconstruction, and/or weight based adjustment of the mA/kV was utilized to reduce the radiation dose to as low as reasonably achievable. COMPARISON: CTA chest 12/25/2020. Chest radiographs earlier today. Non-contrast chest CT 05/04/2023. CLINICAL HISTORY: 58 year old female. Pulmonary embolism (PE) suspected, high prob.  FINDINGS: PULMONARY ARTERIES: Pulmonary arteries are adequately opacified for evaluation. No pulmonary embolism. Main pulmonary artery is normal in caliber. MEDIASTINUM: Excellent pulmonary artery contrast timing. No left heart or aortic contrast. Chronic cardiomegaly. Reflux of right heart contrast into dilated hepatic veins and hepatic IVC. No pericardial effusion. Calcified thoracic aortic atherosclerosis. Tortuous great vessels. There is no acute abnormality of the thoracic aorta. LYMPH NODES: No mediastinal, hilar or axillary lymphadenopathy. LUNGS AND PLEURA: Lower lung volumes compared to 05/04/2023. Major airways remain patent. Small layering right pleural effusion. Mild generalized increased pulmonary ground glass opacity, except for gas trapping in the medial basal segment of the left lower lobe. Bilateral lung base predominant septal thickening, streaky infrahilar opacity, trace fluid or thickening along the right minor fissure. No convincing consolidation. Constellation favors acute pulmonary edema with small volume pleural effusions. No pneumothorax. UPPER ABDOMEN: Other visible upper abdominal non-contrast viscera remarkable for renal vascular calcifications and right renal atrophy. SOFT TISSUES AND BONES: No acute bone or soft tissue abnormality. IMPRESSION: 1. No acute pulmonary embolism identified. 2. Findings favor a degree of right heart dysfunction with acute pulmonary edema and small volume pleural fluid. Chronic cardiomegaly. Electronically signed by: Helayne Hurst MD 01/16/2024 06:00 AM EDT RP Workstation: [id number]   DG Chest 2 View Result Date: 01/16/2024 EXAM: 2 VIEW(S) XRAY OF THE CHEST 01/16/2024 03:47:00 AM COMPARISON: CT chest dated 05/04/2023. CLINICAL HISTORY: Cough,  SOB FINDINGS: LUNGS AND PLEURA: Multifocal patchy opacities, right mid and lower lung predominant, favoring multifocal pneumonia over interstitial edema. No pleural effusion. No pneumothorax. HEART AND MEDIASTINUM:  Mild cardiomegaly. Thoracic aortic atherosclerosis. BONES AND SOFT TISSUES: No acute osseous abnormality. IMPRESSION: 1. Multifocal patchy opacities, right mid/lower lung predominant, favoring multifocal pneumonia over interstitial edema. 2. Mild cardiomegaly. Electronically signed by: Pinkie Pebbles MD 01/16/2024 03:52 AM EDT RP Workstation: [id number]   (Echo, Carotid, EGD, Colonoscopy, ERCP)    Subjective:  Lying flat  Discharge Exam: Vitals:   01/20/24 1019 01/20/24 1318  BP: (!) 147/111 (!) 122/96  Pulse: 85 86  Resp:    Temp:  98.2 F (36.8 C)  SpO2: 99% 96%   Vitals:   01/20/24 0841 01/20/24 0906 01/20/24 1019 01/20/24 1318  BP: (!) 136/103 (!) 136/103 (!) 147/111 (!) 122/96  Pulse: 89  85 86  Resp: 18     Temp: 98.4 F (36.9 C)   98.2 F (36.8 C)  TempSrc: Oral   Oral  SpO2: 99%  99% 96%  Weight:      Height:        General: Pt is alert, awake, not in acute distress Cardiovascular: RRR, S1/S2 +, no rubs, no gallops Respiratory: CTA bilaterally, no wheezing, no rhonchi Abdominal: Soft, NT, ND, bowel sounds + Extremities: no edema, no cyanosis    The results of significant diagnostics from this hospitalization (including imaging, microbiology, ancillary and laboratory) are listed below for reference.     Microbiology: Recent Results (from the past 240 hours)  MRSA Next Gen by PCR, Nasal     Status: None   Collection Time: 01/16/24  9:32 AM   Specimen: Nasal Mucosa; Nasal Swab  Result Value Ref Range Status   MRSA by PCR Next Gen NOT DETECTED NOT DETECTED Final    Comment: (NOTE) The GeneXpert MRSA Assay (FDA approved for NASAL specimens only), is one component of a comprehensive MRSA colonization surveillance program. It is not intended to diagnose MRSA infection nor to guide or monitor treatment for MRSA infections. Test performance is not FDA approved in patients less than 38 years old. Performed at [facility], [address] [place], [place] [address]      Labs: BNP (last 3 results) No results for input(s): BNP in the last 8760 hours. Basic Metabolic Panel: Recent Labs  Lab 01/16/24 0340 01/17/24 0258 01/18/24 0349 01/19/24 0348 01/20/24 0424  NA 141 141 139 137 137  K 3.7 3.2* 3.4* 4.5 4.0  CL 109 106 103 100 100  CO2 18* 23 24 26 22   GLUCOSE 107* 98 90 87 99  BUN 31* 25* 24* 33* 40*  CREATININE 1.38* 1.42* 1.50* 1.77* 1.88*  CALCIUM  9.6 9.0 9.8 10.2 9.8   Liver Function Tests: Recent Labs  Lab 01/17/24 0258  AST 32  ALT 33  ALKPHOS 87  BILITOT 0.8  PROT 7.0  ALBUMIN 3.7   No results for input(s): LIPASE, AMYLASE in the last 168 hours. No results for input(s): AMMONIA in the last 168 hours. CBC: Recent Labs  Lab 01/16/24 0340 01/17/24 0258  WBC 7.5 5.2  HGB 13.0 12.7  HCT 40.5 39.9  MCV 84.9 85.3  PLT 187 198   Cardiac Enzymes: No results for input(s): CKTOTAL, CKMB, CKMBINDEX, TROPONINI in the last 168 hours. BNP: Invalid input(s): POCBNP CBG: No results for input(s): GLUCAP in the last 168 hours. D-Dimer No results for input(s): DDIMER in the last 72 hours. Hgb A1c  No results for input(s): HGBA1C in the last 72 hours. Lipid Profile No results for input(s): CHOL, HDL, LDLCALC, TRIG, CHOLHDL, LDLDIRECT in the last 72 hours. Thyroid  function studies No results for input(s): TSH, T4TOTAL, T3FREE, THYROIDAB in the last 72 hours.  Invalid input(s): FREET3 Anemia work up No results for input(s): VITAMINB12, FOLATE, FERRITIN, TIBC, IRON, RETICCTPCT in the last 72 hours. Urinalysis    Component Value Date/Time   COLORURINE COLORLESS (A) 04/13/2021 2337   APPEARANCEUR CLEAR 04/13/2021 2337   LABSPEC 1.005 04/13/2021 2337   PHURINE 7.0 04/13/2021 2337   GLUCOSEU NEGATIVE 04/13/2021 2337   HGBUR SMALL (A) 04/13/2021 2337   BILIRUBINUR NEGATIVE 04/13/2021 2337   BILIRUBINUR negative 06/29/2018 0928   BILIRUBINUR  NEG 04/23/2014 1245   KETONESUR NEGATIVE 04/13/2021 2337   PROTEINUR NEGATIVE 04/13/2021 2337   UROBILINOGEN 0.2 06/29/2018 0928   UROBILINOGEN 0.2 09/20/2013 1805   NITRITE NEGATIVE 04/13/2021 2337   LEUKOCYTESUR NEGATIVE 04/13/2021 2337   Sepsis Labs Recent Labs  Lab 01/16/24 0340 01/17/24 0258  WBC 7.5 5.2   Microbiology Recent Results (from the past 240 hours)  MRSA Next Gen by PCR, Nasal     Status: None   Collection Time: 01/16/24  9:32 AM   Specimen: Nasal Mucosa; Nasal Swab  Result Value Ref Range Status   MRSA by PCR Next Gen NOT DETECTED NOT DETECTED Final    Comment: (NOTE) The GeneXpert MRSA Assay (FDA approved for NASAL specimens only), is one component of a comprehensive MRSA colonization surveillance program. It is not intended to diagnose MRSA infection nor to guide or monitor treatment for MRSA infections. Test performance is not FDA approved in patients less than 73 years old. Performed at [facility], [address] [place], [place] [address]      Time coordinating discharge: 40 min SIGNED:   Almarie KANDICE Hoots, MD  [facility] 01/21/2024, 2:18 PM

## 2024-01-21 NOTE — Transitions of Care (Post Inpatient/ED Visit) (Signed)
   01/21/2024  Name: Morgan Bean MRN: [id number] DOB: 1966/03/10  Today's TOC FU Call Status: Today's TOC FU Call Status:: Unsuccessful Call (1st Attempt) Unsuccessful Call (1st Attempt) Date: 01/21/24  Attempted to reach the patient regarding the most recent Inpatient/ED visit.  Follow Up Plan: Additional outreach attempts will be made to reach the patient to complete the Transitions of Care (Post Inpatient/ED visit) call.   Andrea Dimes RN, BSN [place]  [facility] RN Care Manager [phone]

## 2024-01-24 ENCOUNTER — Other Ambulatory Visit: Payer: Self-pay

## 2024-01-24 ENCOUNTER — Telehealth: Payer: Self-pay | Admitting: *Deleted

## 2024-01-24 ENCOUNTER — Other Ambulatory Visit (HOSPITAL_BASED_OUTPATIENT_CLINIC_OR_DEPARTMENT_OTHER): Payer: Self-pay

## 2024-01-24 NOTE — Transitions of Care (Post Inpatient/ED Visit) (Signed)
   01/24/2024  Name: Morgan Bean MRN: [id number] DOB: Jan 24, 1966  Today's TOC FU Call Status: Today's TOC FU Call Status:: Unsuccessful Call (2nd Attempt) Unsuccessful Call (2nd Attempt) Date: 01/24/24  Attempted to reach the patient regarding the most recent Inpatient/ED visit.  Follow Up Plan: Additional outreach attempts will be made to reach the patient to complete the Transitions of Care (Post Inpatient/ED visit) call.   Andrea Dimes RN, BSN [place]  [facility] RN Care Manager [phone]

## 2024-01-26 ENCOUNTER — Encounter (HOSPITAL_BASED_OUTPATIENT_CLINIC_OR_DEPARTMENT_OTHER): Payer: Self-pay

## 2024-01-27 ENCOUNTER — Encounter (HOSPITAL_BASED_OUTPATIENT_CLINIC_OR_DEPARTMENT_OTHER): Payer: Self-pay

## 2024-01-28 ENCOUNTER — Ambulatory Visit (HOSPITAL_BASED_OUTPATIENT_CLINIC_OR_DEPARTMENT_OTHER): Payer: MEDICAID | Admitting: Family

## 2024-02-03 ENCOUNTER — Other Ambulatory Visit (HOSPITAL_BASED_OUTPATIENT_CLINIC_OR_DEPARTMENT_OTHER): Payer: Self-pay

## 2024-02-07 ENCOUNTER — Other Ambulatory Visit: Payer: Self-pay

## 2024-02-16 ENCOUNTER — Other Ambulatory Visit: Payer: Self-pay

## 2024-02-16 ENCOUNTER — Other Ambulatory Visit (HOSPITAL_COMMUNITY): Payer: Self-pay

## 2024-02-16 ENCOUNTER — Other Ambulatory Visit (HOSPITAL_BASED_OUTPATIENT_CLINIC_OR_DEPARTMENT_OTHER): Payer: Self-pay | Admitting: Cardiovascular Disease

## 2024-02-16 DIAGNOSIS — I4819 Other persistent atrial fibrillation: Secondary | ICD-10-CM

## 2024-02-18 ENCOUNTER — Encounter (HOSPITAL_COMMUNITY): Payer: Self-pay

## 2024-02-18 ENCOUNTER — Other Ambulatory Visit (HOSPITAL_COMMUNITY): Payer: Self-pay

## 2024-02-20 ENCOUNTER — Other Ambulatory Visit (HOSPITAL_COMMUNITY): Payer: Self-pay

## 2024-02-20 MED ORDER — DILTIAZEM HCL ER COATED BEADS 360 MG PO CP24
360.0000 mg | ORAL_CAPSULE | Freq: Every day | ORAL | 3 refills | Status: AC
  Filled 2024-02-20 – 2024-03-22 (×3): qty 90, 90d supply, fill #0

## 2024-02-21 ENCOUNTER — Other Ambulatory Visit (HOSPITAL_COMMUNITY): Payer: Self-pay

## 2024-02-21 ENCOUNTER — Other Ambulatory Visit: Payer: Self-pay

## 2024-02-22 ENCOUNTER — Other Ambulatory Visit: Payer: Self-pay

## 2024-02-22 ENCOUNTER — Ambulatory Visit: Payer: MEDICAID | Attending: Family Medicine | Admitting: Family Medicine

## 2024-02-22 VITALS — BP 132/82 | HR 106 | Temp 97.4°F | Ht 66.0 in | Wt 175.4 lb

## 2024-02-22 DIAGNOSIS — Z23 Encounter for immunization: Secondary | ICD-10-CM

## 2024-02-22 DIAGNOSIS — I480 Paroxysmal atrial fibrillation: Secondary | ICD-10-CM

## 2024-02-22 DIAGNOSIS — I4819 Other persistent atrial fibrillation: Secondary | ICD-10-CM

## 2024-02-22 DIAGNOSIS — Z1231 Encounter for screening mammogram for malignant neoplasm of breast: Secondary | ICD-10-CM

## 2024-02-22 DIAGNOSIS — I130 Hypertensive heart and chronic kidney disease with heart failure and stage 1 through stage 4 chronic kidney disease, or unspecified chronic kidney disease: Secondary | ICD-10-CM | POA: Diagnosis not present

## 2024-02-22 DIAGNOSIS — N183 Chronic kidney disease, stage 3 unspecified: Secondary | ICD-10-CM

## 2024-02-22 DIAGNOSIS — I5032 Chronic diastolic (congestive) heart failure: Secondary | ICD-10-CM

## 2024-02-22 DIAGNOSIS — R7303 Prediabetes: Secondary | ICD-10-CM | POA: Diagnosis not present

## 2024-02-22 DIAGNOSIS — F1410 Cocaine abuse, uncomplicated: Secondary | ICD-10-CM

## 2024-02-22 DIAGNOSIS — F32A Depression, unspecified: Secondary | ICD-10-CM

## 2024-02-22 DIAGNOSIS — F419 Anxiety disorder, unspecified: Secondary | ICD-10-CM

## 2024-02-22 DIAGNOSIS — J449 Chronic obstructive pulmonary disease, unspecified: Secondary | ICD-10-CM

## 2024-02-22 LAB — POCT GLYCOSYLATED HEMOGLOBIN (HGB A1C): HbA1c, POC (prediabetic range): 5.7 % (ref 5.7–6.4)

## 2024-02-22 NOTE — Progress Notes (Signed)
 Subjective:  Patient ID: Morgan Bean, female    DOB: August 23, 1965  Age: 58 y.o. MRN: [id number]  CC: Medical Management of Chronic Issues     Discussed the use of AI scribe software for clinical note transcription with the patient, who gave verbal consent to proceed.  History of Present Illness Morgan Bean is a 58 year old female with Hypertension, paroxysmal Afib, A-flutter (status post ablation) CAD, previous CVA, moderate to severe MR, HFpEF, anxiety and depression, polysubstance abuse, Nicotine  dependence (2 ppd since the age of 1, 0.5 ppd over the last few years), CKD, emphysema who presents for follow-up after hospitalization for acute respiratory failure.  She was hospitalized last month for acute respiratory failure due to CHF exacerbation in the setting of medication nonadherence and cocaine abuse. Her breathing difficulty has resolved and she currently has no shortness of breath. She initially did not fill her discharge medication right away but has it now and feels well.  She reports no COPD exacerbation.  Her blood pressure is elevated today and she forgot to take Benicar . She had swelling in her ankles and feet last night that resolved by today.  She last used cocaine around Thanksgiving.She has significantly reduced her smoking since the hospitalization.  She takes Xarelto  for atrial fibrillation and is also under the care of cardiology.  She is followed by a kidney specialist at [facility]. She has prediabetes with A1c 5.7, improved from 5.8. She reports taking her medications regularly except for today's missed Benicar .    Past Medical History:  Diagnosis Date   Anemia of other chronic disease 11/13/2013   Anginal pain    none in past year   Anxiety    Asthma    patient denies   Autoimmune disease 12/27/2018   Candidiasis 06/23/2013   Chronic diastolic CHF (congestive heart failure) ([name])    Constipation    Coronary artery disease    Lexiscan  myoview   (10/12) with significant ST depression upon Lexiscan  injection but no ischemia or infarction on perfusion images. Left heart cath (10/12): 30% mid LAD, 30% ostial D1, 50% ostial D2.     Cough 11/13/2013   Depression    Headache 02/22/2014   Hx of cardiovascular stress test    Lexiscan  Myoview  (2/16): Breast attenuation artifact, no ischemia, EF 64%; low risk   Hyperlipemia    Hypertension    Iron deficiency    hx of   Leukocytoclastic vasculitis ([name])    Rash across lower body, occurred in 9/11, diagnosed by skin biopsy. ANA positive. Thought to be secondary to cocaine use.   Leukopenia 03/21/2013   Lymphadenopathy 03/21/2013   Lymphadenopathy 01/30/2014   Mental disorder    Pancytopenia ([name])    Pancytopenia, acquired ([name]) 07/16/2015   Polysubstance abuse ([name])    Prior cocaine   Pulmonary HTN ([name])    Echo (9/11) with EF 65%, mild LVH, mild AI, mild MR, moderate to possibly severe TR with PA systolic pressure 52 mmHg. Echo (10/12): severe LV hypertrophy, EF 60-65%, mild MR, mild AI, moderate to severe tricuspid regurgitation, PA systolic pressure 38 mmHg.  Echo 4/14: moderate LVH, EF 65%, normal wall motion, diastolic dysfunction, mild AI, mild MR   Shortness of breath    a. PFTs 5/14:  FEV1/FVC 99% predicted; FVC 60% predicted; DLCO mildly reduced, mild restriction, air trapping.;  b.  seen by pulmo (Dr. Corrie) 5/14: mainly upper airway symptoms - ACE d/c'd (sx's better)   Stroke [facility]) 2010ish   Syphilis 04/25/2013  Tobacco abuse    Tricuspid regurgitation    Unspecified deficiency anemia 03/29/2013    Past Surgical History:  Procedure Laterality Date   ANKLE SURGERY     right   BONE MARROW ASPIRATION     CARDIAC CATHETERIZATION     CARDIAC CATHETERIZATION N/A 04/23/2015   Procedure: Right Heart Cath;  Surgeon: Ezra GORMAN Shuck, MD;  Location: [facility] INVASIVE CV LAB;  Service: Cardiovascular;  Laterality: N/A;   CARDIOVERSION N/A 03/11/2018   Procedure: CARDIOVERSION;   Surgeon: Okey Vina GAILS, MD;  Location: [facility] ENDOSCOPY;  Service: Cardiovascular;  Laterality: N/A;   I & D EXTREMITY  08/09/2011   Procedure: IRRIGATION AND DEBRIDEMENT EXTREMITY;  Surgeon: Deward GORMAN Curvin DOUGLAS, MD;  Location: [facility] OR;  Service: General;  Laterality: Left;  i & D Left axilla abscess   LYMPH NODE BIOPSY N/A 04/05/2013   Procedure: EXCISIONAL BIOPSY RIGHT SUBMANDIBULAR NODE, NASAL ENDOSCOPIC WITH BIOPSY NASAL PHARYNX;  Surgeon: Marlyce Finer, MD;  Location: [facility] OR;  Service: ENT;  Laterality: N/A;   MULTIPLE EXTRACTIONS WITH ALVEOLOPLASTY N/A 03/03/2019   Procedure: MULTIPLE EXTRACTION WITH ALVEOLOPLASTY;  Surgeon: Sheryle Hamilton, DDS;  Location: [facility] OR;  Service: Oral Surgery;  Laterality: N/A;   TEE WITHOUT CARDIOVERSION N/A 03/11/2018   Procedure: TRANSESOPHAGEAL ECHOCARDIOGRAM (TEE);  Surgeon: Okey Vina GAILS, MD;  Location: [facility] ENDOSCOPY;  Service: Cardiovascular;  Laterality: N/A;   TEE WITHOUT CARDIOVERSION N/A 12/27/2020   Procedure: TRANSESOPHAGEAL ECHOCARDIOGRAM (TEE);  Surgeon: Sheena Pugh, DO;  Location: [facility] ENDOSCOPY;  Service: Cardiovascular;  Laterality: N/A;    Family History  Problem Relation Age of Onset   Coronary artery disease Mother    Diabetes Mother    Diabetes Father    Hypertension Father    Coronary artery disease Father    Heart attack Father    Breast cancer Maternal Grandmother    Diabetes Maternal Grandmother    Heart attack Maternal Grandmother    Stroke Maternal Grandmother    Asthma Grandson     Social History   Socioeconomic History   Marital status: Single    Spouse name: Not on file   Number of children: 2   Years of education: Not on file   Highest education level: GED or equivalent  Occupational History   Occupation: unemployed   Occupation: disabled  Tobacco Use   Smoking status: Former    Current packs/day: 0.50    Average packs/day: 0.5 packs/day for 39.0 years (19.5 ttl pk-yrs)    Types: Cigarettes    Passive exposure: Current    Smokeless tobacco: Never  Vaping Use   Vaping status: Never Used  Substance and Sexual Activity   Alcohol use: Not Currently    Comment: used in the past   Drug use: Yes    Frequency: 7.0 times per week    Types: Crack cocaine   Sexual activity: Not Currently  Other Topics Concern   Not on file  Social History Narrative   Lives with mom, sisters and brothers.   Social Drivers of Health   Financial Resource Strain: Medium Risk (02/22/2024)   Overall Financial Resource Strain (CARDIA)    Difficulty of Paying Living Expenses: Somewhat hard  Food Insecurity: Food Insecurity Present (02/22/2024)   Hunger Vital Sign    Worried About Running Out of Food in the Last Year: Sometimes true    Ran Out of Food in the Last Year: Sometimes true  Transportation Needs: Unmet Transportation Needs (02/22/2024)   PRAPARE - Transportation  Lack of Transportation (Medical): Yes    Lack of Transportation (Non-Medical): Yes  Physical Activity: Inactive (02/22/2024)   Exercise Vital Sign    Days of Exercise per Week: 0 days    Minutes of Exercise per Session: Not on file  Stress: Stress Concern Present (02/22/2024)   [employer] of Occupational Health - Occupational Stress Questionnaire    Feeling of Stress: To some extent  Social Connections: Moderately Isolated (02/22/2024)   Social Connection and Isolation Panel    Frequency of Communication with Friends and Family: Twice a week    Frequency of Social Gatherings with Friends and Family: Once a week    Attends Religious Services: 1 to 4 times per year    Active Member of [employer] or Organizations: No    Attends Engineer, Structural: Not on file    Marital Status: Never married    Allergies  Allergen Reactions   Cipro [Ciprofloxacin Hcl] Itching   Cyprodenate Other (See Comments)    Unknown reaction    Outpatient Medications Prior to Visit  Medication Sig Dispense Refill   acetaminophen  (TYLENOL ) 500 MG tablet Take 1,000 mg by  mouth 2 (two) times daily as needed for headache or fever (pain).     albuterol  (VENTOLIN  HFA) 108 (90 Base) MCG/ACT inhaler Inhale 2 puffs into the lungs every 6 (six) hours as needed for wheezing or shortness of breath. 6.7 g 2   atorvastatin  (LIPITOR ) 80 MG tablet Take 1 tablet (80 mg total) by mouth at bedtime. (Patient taking differently: Take 80 mg by mouth daily.) 90 tablet 1   Cholecalciferol  (VITAMIN D -3 PO) Take 2 each by mouth daily. OTC vitamin D3 gummy     diltiazem  (CARDIZEM  CD) 360 MG 24 hr capsule Take 1 capsule (360 mg total) by mouth daily. Please call office to schedule appt with cardiologist for continued refills. [phone] 90 capsule 3   DULoxetine  (CYMBALTA ) 60 MG capsule Take 1 capsule (60 mg total) by mouth daily. 90 capsule 1   fluticasone  (FLONASE ) 50 MCG/ACT nasal spray Place 2 sprays into both nostrils daily. (Patient taking differently: Place 1-2 sprays into both nostrils daily as needed for allergies.) 16 g 11   Fluticasone -Umeclidin-Vilant (TRELEGY ELLIPTA ) 100-62.5-25 MCG/ACT AEPB Inhale 1 puff into the lungs daily. 60 each 11   furosemide  (LASIX ) 40 MG tablet Take 1 tablet (40 mg total) by mouth daily. 90 tablet 0   hydrOXYzine  (ATARAX ) 25 MG tablet Take 1 tablet (25 mg total) by mouth every 8 (eight) hours as needed for anxiety or itching. 30 tablet 0   olmesartan  (BENICAR ) 20 MG tablet Take 0.5 tablets (10 mg total) by mouth daily. 45 tablet 2   Rivaroxaban  (XARELTO ) 15 MG TABS tablet Take 1 tablet (15 mg total) by mouth daily with supper. 42 tablet 2   sodium bicarbonate  650 MG tablet Take 1 tablet (650 mg total) by mouth 2 (two) times daily. 60 tablet 6   spironolactone  (ALDACTONE ) 25 MG tablet Take 1 tablet (25 mg total) by mouth daily. 90 tablet 2   omeprazole  (PRILOSEC) 40 MG capsule Take 1 capsule (40 mg total) by mouth daily. Take with water, wait 30-60 minutes after taking before eating or drinking anything but water (Patient taking differently: Take 40 mg  by mouth daily as needed (heartburn).) 90 capsule 3   No facility-administered medications prior to visit.     ROS Review of Systems  Constitutional:  Negative for activity change and appetite change.  HENT:  Negative  for sinus pressure and sore throat.   Respiratory:  Negative for chest tightness, shortness of breath and wheezing.   Cardiovascular:  Negative for chest pain and palpitations.  Gastrointestinal:  Negative for abdominal distention, abdominal pain and constipation.  Genitourinary: Negative.   Musculoskeletal: Negative.   Psychiatric/Behavioral:  Negative for behavioral problems and dysphoric mood.     Objective:  BP (!) 154/82   Pulse (!) 106   Temp (!) 97.4 F (36.3 C) (Oral)   Ht 5' 6 (1.676 m)   Wt 175 lb 6.4 oz (79.6 kg)   LMP 04/24/2013 Comment: states periods come and go  SpO2 98%   BMI 28.31 kg/m      02/22/2024    1:45 PM 01/20/2024    1:18 PM 01/20/2024   10:19 AM  BP/Weight  Systolic BP 154 122 147  Diastolic BP 82 96 111  Wt. (Lbs) 175.4    BMI 28.31 kg/m2        Physical Exam Constitutional:      Appearance: She is well-developed.  Cardiovascular:     Rate and Rhythm: Tachycardia present.     Heart sounds: Normal heart sounds. No murmur heard. Pulmonary:     Effort: Pulmonary effort is normal.     Breath sounds: Normal breath sounds. No wheezing or rales.  Chest:     Chest wall: No tenderness.  Abdominal:     General: Bowel sounds are normal. There is no distension.     Palpations: Abdomen is soft. There is no mass.     Tenderness: There is no abdominal tenderness.  Musculoskeletal:        General: Normal range of motion.     Right lower leg: Edema present.     Left lower leg: Edema present.  Neurological:     Mental Status: She is alert and oriented to person, place, and time.  Psychiatric:        Mood and Affect: Mood normal.        Latest Ref Rng & Units 01/20/2024    4:24 AM 01/19/2024    3:48 AM 01/18/2024    3:49  AM  CMP  Glucose 70 - 99 mg/dL 99  87  90   BUN 6 - 20 mg/dL 40  33  24   Creatinine 0.44 - 1.00 mg/dL 8.11  8.22  8.49   Sodium 135 - 145 mmol/L 137  137  139   Potassium 3.5 - 5.1 mmol/L 4.0  4.5  3.4   Chloride 98 - 111 mmol/L 100  100  103   CO2 22 - 32 mmol/L 22  26  24    Calcium  8.9 - 10.3 mg/dL 9.8  89.7  9.8     Lipid Panel     Component Value Date/Time   CHOL 177 01/12/2024 1433   TRIG 88 01/12/2024 1433   HDL 47 01/12/2024 1433   CHOLHDL 3.8 01/12/2024 1433   CHOLHDL 3.2 12/26/2020 0232   VLDL 10 12/26/2020 0232   LDLCALC 114 (H) 01/12/2024 1433   LDLDIRECT 142.1 12/25/2010 0846    CBC    Component Value Date/Time   WBC 5.2 01/17/2024 0258   RBC 4.68 01/17/2024 0258   HGB 12.7 01/17/2024 0258   HGB 13.6 09/10/2022 0926   HGB 10.0 (L) 07/16/2015 1407   HCT 39.9 01/17/2024 0258   HCT 42.3 09/10/2022 0926   HCT 31.8 (L) 07/16/2015 1407   PLT 198 01/17/2024 0258   PLT 212 09/10/2022 0926  MCV 85.3 01/17/2024 0258   MCV 83 09/10/2022 0926   MCV 78.2 (L) 07/16/2015 1407   MCH 27.1 01/17/2024 0258   MCHC 31.8 01/17/2024 0258   RDW 15.4 01/17/2024 0258   RDW 13.4 09/10/2022 0926   RDW 14.9 (H) 07/16/2015 1407   LYMPHSABS 2.0 09/10/2022 0926   LYMPHSABS 1.8 07/16/2015 1407   MONOABS 0.4 04/17/2021 0428   MONOABS 0.8 07/16/2015 1407   EOSABS 0.2 09/10/2022 0926   BASOSABS 0.0 09/10/2022 0926   BASOSABS 0.0 07/16/2015 1407    Lab Results  Component Value Date   HGBA1C 5.7 02/22/2024    Lab Results  Component Value Date   HGBA1C 5.7 02/22/2024   HGBA1C 5.8 (H) 04/27/2023   HGBA1C 5.7 (H) 09/10/2022       Assessment & Plan Hypertension with congestive heart failure and stage 3 chronic kidney disease Blood pressure initially elevated, likely due to missed Benicar . Recent CHF exacerbation and hospitalization. Ankle swelling from fluid overload. Slightly abnormal kidney function. - Rechecked blood pressure revealed improvement - Ensure regular  antihypertensive including Benicar  and furosemide  intake. - Monitor kidney function with labs on December 29th previously ordered by another clinician - Continue to follow-up with nephrology -Counseled on blood pressure goal of less than 130/80, low-sodium, DASH diet, medication compliance, 150 minutes of moderate intensity exercise per week. Discussed medication compliance, adverse effects.   Atrial fibrillation Heart rate slightly elevated, possibly from recent cocaine use. On Xarelto  for management. - Continue Xarelto . - Continue rate control with Cardizem  - Continue to follow-up with cardiology  Chronic obstructive pulmonary disease (COPD) with tobacco use disorder COPD exacerbated by tobacco use. Reduced smoking but continues. Recent pneumonia worsened respiratory issues. - Encouraged smoking cessation. - Continue Trelegy  Prediabetes A1c at 5.7 indicates prediabetes. Risk of diabetes present. - Advised dietary and lifestyle modifications.  Depression and anxiety Controlled On duloxetine  for management. - Continue duloxetine .  General health maintenance Due for pneumonia vaccine. Tetanus vaccine administered. - Scheduled follow-up in three months.     No orders of the defined types were placed in this encounter.   Follow-up: Return in about 3 months (around 05/22/2024) for Chronic medical conditions.       Corrina Sabin, MD, FAAFP. [facility] [place], [place] [phone]   02/22/2024, 2:03 PM

## 2024-02-22 NOTE — Patient Instructions (Addendum)
 VISIT SUMMARY:  Today, you came in for a follow-up visit after your recent hospitalization for acute respiratory failure due to pneumonia. Your breathing has improved, and you are not experiencing shortness of breath. We discussed your current medications, blood pressure, and overall health status.  YOUR PLAN:  -HYPERTENSION WITH CONGESTIVE HEART FAILURE AND STAGE 3 CHRONIC KIDNEY DISEASE: Your blood pressure was elevated today but improved upon repeat likely because you missed your Benicar  dose. Congestive heart failure means your heart doesn't pump blood as well as it should, and stage 3 chronic kidney disease indicates moderate kidney damage. Please ensure you take Benicar  and furosemide  regularly. We will monitor your kidney function with labs on December 29th.  -ATRIAL FIBRILLATION: Atrial fibrillation is an irregular and often rapid heart rate. Your heart rate was slightly elevated, possibly due to recent cocaine use. Continue taking Xarelto  as prescribed.  -CHRONIC OBSTRUCTIVE PULMONARY DISEASE (COPD) WITH TOBACCO USE DISORDER: COPD is a chronic lung disease that makes it hard to breathe, and smoking can worsen it. You have reduced smoking, which is good, but it's important to quit completely. Recent pneumonia made your breathing issues worse. Please continue to work on quitting smoking.  -PREDIABETES: Prediabetes means your blood sugar levels are higher than normal but not high enough to be classified as diabetes. Your A1c is 5.7, which indicates prediabetes. Please follow the advised dietary modifications to help manage this condition.  -DEPRESSION AND ANXIETY: You are currently managing depression and anxiety with duloxetine . Please continue taking duloxetine  as prescribed.  -GENERAL HEALTH MAINTENANCE: You are due for a pneumonia vaccine, and you received a tetanus vaccine today. We will schedule a follow-up appointment in three months.  INSTRUCTIONS:  Please ensure you take your  medications regularly, especially Benicar  and furosemide . We will monitor your kidney function with labs on December 29th. Continue taking Xarelto  and duloxetine  as prescribed. Work on quitting smoking completely. Follow the dietary modifications advised for prediabetes. We will see you again in three months for a follow-up appointment.

## 2024-02-23 ENCOUNTER — Other Ambulatory Visit: Payer: Self-pay

## 2024-02-24 ENCOUNTER — Other Ambulatory Visit (HOSPITAL_COMMUNITY): Payer: Self-pay

## 2024-03-01 ENCOUNTER — Other Ambulatory Visit: Payer: Self-pay

## 2024-03-03 ENCOUNTER — Other Ambulatory Visit: Payer: Self-pay

## 2024-03-22 ENCOUNTER — Other Ambulatory Visit (HOSPITAL_COMMUNITY): Payer: Self-pay

## 2024-03-22 ENCOUNTER — Other Ambulatory Visit: Payer: Self-pay | Admitting: Family Medicine

## 2024-03-22 ENCOUNTER — Other Ambulatory Visit: Payer: Self-pay

## 2024-03-22 ENCOUNTER — Encounter (HOSPITAL_COMMUNITY): Payer: Self-pay

## 2024-03-22 DIAGNOSIS — F32A Depression, unspecified: Secondary | ICD-10-CM

## 2024-03-22 DIAGNOSIS — M5416 Radiculopathy, lumbar region: Secondary | ICD-10-CM

## 2024-03-22 MED ORDER — DULOXETINE HCL 60 MG PO CPEP
60.0000 mg | ORAL_CAPSULE | Freq: Every day | ORAL | 1 refills | Status: AC
  Filled 2024-03-22: qty 90, 90d supply, fill #0

## 2024-03-31 ENCOUNTER — Ambulatory Visit (HOSPITAL_BASED_OUTPATIENT_CLINIC_OR_DEPARTMENT_OTHER): Payer: MEDICAID | Admitting: Family

## 2024-03-31 ENCOUNTER — Encounter (HOSPITAL_BASED_OUTPATIENT_CLINIC_OR_DEPARTMENT_OTHER): Payer: Self-pay

## 2024-04-21 ENCOUNTER — Other Ambulatory Visit: Payer: Self-pay

## 2024-04-21 ENCOUNTER — Other Ambulatory Visit (HOSPITAL_COMMUNITY): Payer: Self-pay

## 2024-05-23 ENCOUNTER — Ambulatory Visit: Payer: MEDICAID | Admitting: Family Medicine
